# Patient Record
Sex: Male | Born: 1935 | Race: White | Hispanic: No | Marital: Married | Smoking: Former smoker
Health system: Southern US, Community
[De-identification: ages and names within clinical notes are randomized; demographics above are authoritative.]

## PROBLEM LIST (undated history)

## (undated) DIAGNOSIS — F1010 Alcohol abuse, uncomplicated: Secondary | ICD-10-CM

## (undated) DIAGNOSIS — R609 Edema, unspecified: Secondary | ICD-10-CM

## (undated) DIAGNOSIS — R600 Localized edema: Secondary | ICD-10-CM

## (undated) DIAGNOSIS — A419 Sepsis, unspecified organism: Secondary | ICD-10-CM

## (undated) DIAGNOSIS — C189 Malignant neoplasm of colon, unspecified: Secondary | ICD-10-CM

## (undated) DIAGNOSIS — N186 End stage renal disease: Secondary | ICD-10-CM

## (undated) DIAGNOSIS — Z87891 Personal history of nicotine dependence: Secondary | ICD-10-CM

## (undated) DIAGNOSIS — I10 Essential (primary) hypertension: Secondary | ICD-10-CM

## (undated) DIAGNOSIS — J439 Emphysema, unspecified: Secondary | ICD-10-CM

## (undated) DIAGNOSIS — J449 Chronic obstructive pulmonary disease, unspecified: Secondary | ICD-10-CM

## (undated) DIAGNOSIS — A0472 Enterocolitis due to Clostridium difficile, not specified as recurrent: Secondary | ICD-10-CM

## (undated) HISTORY — DX: Edema, unspecified: R60.9

## (undated) HISTORY — DX: Malignant neoplasm of colon, unspecified: C18.9

## (undated) HISTORY — PX: TONSILLECTOMY: SUR1361

## (undated) HISTORY — PX: COLONOSCOPY: SHX174

## (undated) HISTORY — PX: CATARACT EXTRACTION: SUR2

## (undated) HISTORY — DX: Alcohol abuse, uncomplicated: F10.10

## (undated) HISTORY — PX: COLON RESECTION: SHX5231

## (undated) HISTORY — DX: Emphysema, unspecified: J43.9

## (undated) HISTORY — DX: Localized edema: R60.0

---

## 1993-12-20 HISTORY — PX: MINOR HEMORRHOIDECTOMY: SHX6238

## 1998-05-13 ENCOUNTER — Other Ambulatory Visit: Admission: RE | Admit: 1998-05-13 | Discharge: 1998-05-13 | Payer: Self-pay | Admitting: Urology

## 2001-09-01 ENCOUNTER — Other Ambulatory Visit: Admission: RE | Admit: 2001-09-01 | Discharge: 2001-09-01 | Payer: Self-pay | Admitting: Urology

## 2001-09-01 ENCOUNTER — Encounter (INDEPENDENT_AMBULATORY_CARE_PROVIDER_SITE_OTHER): Payer: Self-pay | Admitting: Specialist

## 2003-01-18 ENCOUNTER — Emergency Department (HOSPITAL_COMMUNITY): Admission: EM | Admit: 2003-01-18 | Discharge: 2003-01-18 | Payer: Self-pay | Admitting: *Deleted

## 2003-01-19 ENCOUNTER — Emergency Department (HOSPITAL_COMMUNITY): Admission: EM | Admit: 2003-01-19 | Discharge: 2003-01-19 | Payer: Self-pay | Admitting: *Deleted

## 2003-01-21 ENCOUNTER — Emergency Department (HOSPITAL_COMMUNITY): Admission: EM | Admit: 2003-01-21 | Discharge: 2003-01-21 | Payer: Self-pay | Admitting: Emergency Medicine

## 2005-09-21 ENCOUNTER — Ambulatory Visit (HOSPITAL_BASED_OUTPATIENT_CLINIC_OR_DEPARTMENT_OTHER): Admission: RE | Admit: 2005-09-21 | Discharge: 2005-09-21 | Payer: Self-pay | Admitting: Otolaryngology

## 2005-09-21 ENCOUNTER — Encounter (INDEPENDENT_AMBULATORY_CARE_PROVIDER_SITE_OTHER): Payer: Self-pay | Admitting: *Deleted

## 2006-08-29 ENCOUNTER — Encounter: Admission: RE | Admit: 2006-08-29 | Discharge: 2006-08-29 | Payer: Self-pay | Admitting: Gastroenterology

## 2006-09-21 ENCOUNTER — Encounter (INDEPENDENT_AMBULATORY_CARE_PROVIDER_SITE_OTHER): Payer: Self-pay | Admitting: Specialist

## 2006-09-21 ENCOUNTER — Inpatient Hospital Stay (HOSPITAL_COMMUNITY): Admission: RE | Admit: 2006-09-21 | Discharge: 2006-09-27 | Payer: Self-pay | Admitting: General Surgery

## 2006-10-07 ENCOUNTER — Ambulatory Visit: Payer: Self-pay | Admitting: Hematology & Oncology

## 2006-12-06 ENCOUNTER — Ambulatory Visit (HOSPITAL_COMMUNITY): Admission: RE | Admit: 2006-12-06 | Discharge: 2006-12-06 | Payer: Self-pay | Admitting: Hematology & Oncology

## 2006-12-26 ENCOUNTER — Ambulatory Visit: Payer: Self-pay | Admitting: Hematology & Oncology

## 2006-12-29 LAB — COMPREHENSIVE METABOLIC PANEL
ALT: 46 U/L (ref 0–53)
AST: 46 U/L — ABNORMAL HIGH (ref 0–37)
BUN: 13 mg/dL (ref 6–23)
CO2: 29 mEq/L (ref 19–32)
Creatinine, Ser: 0.72 mg/dL (ref 0.40–1.50)
Total Bilirubin: 0.8 mg/dL (ref 0.3–1.2)

## 2006-12-29 LAB — CBC WITH DIFFERENTIAL/PLATELET
BASO%: 0.3 % (ref 0.0–2.0)
EOS%: 1.4 % (ref 0.0–7.0)
HCT: 44.9 % (ref 38.7–49.9)
LYMPH%: 25.2 % (ref 14.0–48.0)
MCH: 34 pg — ABNORMAL HIGH (ref 28.0–33.4)
MCHC: 33.8 g/dL (ref 32.0–35.9)
NEUT%: 62.2 % (ref 40.0–75.0)
Platelets: 198 10*3/uL (ref 145–400)

## 2006-12-29 LAB — CEA: CEA: 2.7 ng/mL (ref 0.0–5.0)

## 2007-03-20 ENCOUNTER — Ambulatory Visit (HOSPITAL_COMMUNITY): Admission: RE | Admit: 2007-03-20 | Discharge: 2007-03-20 | Payer: Self-pay | Admitting: Hematology & Oncology

## 2007-03-20 ENCOUNTER — Ambulatory Visit: Payer: Self-pay | Admitting: Hematology & Oncology

## 2007-03-23 LAB — CBC WITH DIFFERENTIAL/PLATELET
Basophils Absolute: 0 10*3/uL (ref 0.0–0.1)
EOS%: 1.3 % (ref 0.0–7.0)
Eosinophils Absolute: 0.1 10*3/uL (ref 0.0–0.5)
HCT: 45.7 % (ref 38.7–49.9)
HGB: 16.1 g/dL (ref 13.0–17.1)
MCH: 35.5 pg — ABNORMAL HIGH (ref 28.0–33.4)
NEUT#: 4.1 10*3/uL (ref 1.5–6.5)
NEUT%: 57.3 % (ref 40.0–75.0)
lymph#: 2.2 10*3/uL (ref 0.9–3.3)

## 2007-03-23 LAB — COMPREHENSIVE METABOLIC PANEL
Albumin: 5 g/dL (ref 3.5–5.2)
BUN: 11 mg/dL (ref 6–23)
CO2: 28 mEq/L (ref 19–32)
Calcium: 9.8 mg/dL (ref 8.4–10.5)
Chloride: 103 mEq/L (ref 96–112)
Creatinine, Ser: 0.72 mg/dL (ref 0.40–1.50)
Glucose, Bld: 93 mg/dL (ref 70–99)
Potassium: 4.4 mEq/L (ref 3.5–5.3)

## 2007-06-12 ENCOUNTER — Ambulatory Visit (HOSPITAL_COMMUNITY): Admission: RE | Admit: 2007-06-12 | Discharge: 2007-06-12 | Payer: Self-pay | Admitting: Hematology & Oncology

## 2007-06-14 ENCOUNTER — Ambulatory Visit: Payer: Self-pay | Admitting: Hematology & Oncology

## 2007-06-29 LAB — COMPREHENSIVE METABOLIC PANEL
ALT: 100 U/L — ABNORMAL HIGH (ref 0–53)
AST: 150 U/L — ABNORMAL HIGH (ref 0–37)
Albumin: 4.6 g/dL (ref 3.5–5.2)
Alkaline Phosphatase: 63 U/L (ref 39–117)
Potassium: 4.6 mEq/L (ref 3.5–5.3)
Sodium: 145 mEq/L (ref 135–145)
Total Bilirubin: 1 mg/dL (ref 0.3–1.2)
Total Protein: 7.5 g/dL (ref 6.0–8.3)

## 2007-06-29 LAB — CBC WITH DIFFERENTIAL/PLATELET
BASO%: 0.6 % (ref 0.0–2.0)
EOS%: 1.9 % (ref 0.0–7.0)
Eosinophils Absolute: 0.1 10*3/uL (ref 0.0–0.5)
LYMPH%: 26.2 % (ref 14.0–48.0)
MCH: 36 pg — ABNORMAL HIGH (ref 28.0–33.4)
MCHC: 35 g/dL (ref 32.0–35.9)
MCV: 102.9 fL — ABNORMAL HIGH (ref 81.6–98.0)
MONO%: 13.7 % — ABNORMAL HIGH (ref 0.0–13.0)
NEUT#: 3.3 10*3/uL (ref 1.5–6.5)
Platelets: 206 10*3/uL (ref 145–400)
RBC: 4.07 10*6/uL — ABNORMAL LOW (ref 4.20–5.71)
RDW: 15.6 % — ABNORMAL HIGH (ref 11.2–14.6)

## 2007-09-29 ENCOUNTER — Ambulatory Visit: Payer: Self-pay | Admitting: Hematology & Oncology

## 2007-10-03 LAB — CBC WITH DIFFERENTIAL/PLATELET
BASO%: 0.3 % (ref 0.0–2.0)
HCT: 41.6 % (ref 38.7–49.9)
LYMPH%: 28.7 % (ref 14.0–48.0)
MCHC: 35.3 g/dL (ref 32.0–35.9)
MCV: 101.4 fL — ABNORMAL HIGH (ref 81.6–98.0)
MONO#: 0.7 10*3/uL (ref 0.1–0.9)
MONO%: 10.7 % (ref 0.0–13.0)
NEUT%: 58.5 % (ref 40.0–75.0)
Platelets: 260 10*3/uL (ref 145–400)
RBC: 4.11 10*6/uL — ABNORMAL LOW (ref 4.20–5.71)
WBC: 6.9 10*3/uL (ref 4.0–10.0)

## 2007-10-03 LAB — COMPREHENSIVE METABOLIC PANEL
ALT: 17 U/L (ref 0–53)
AST: 22 U/L (ref 0–37)
Albumin: 4.4 g/dL (ref 3.5–5.2)
Alkaline Phosphatase: 73 U/L (ref 39–117)
Glucose, Bld: 123 mg/dL — ABNORMAL HIGH (ref 70–99)
Potassium: 4.4 mEq/L (ref 3.5–5.3)
Sodium: 142 mEq/L (ref 135–145)
Total Protein: 7.4 g/dL (ref 6.0–8.3)

## 2007-10-04 ENCOUNTER — Ambulatory Visit (HOSPITAL_COMMUNITY): Admission: RE | Admit: 2007-10-04 | Discharge: 2007-10-04 | Payer: Self-pay | Admitting: Hematology & Oncology

## 2008-02-12 ENCOUNTER — Ambulatory Visit: Payer: Self-pay | Admitting: Hematology & Oncology

## 2008-02-21 ENCOUNTER — Ambulatory Visit (HOSPITAL_COMMUNITY): Admission: RE | Admit: 2008-02-21 | Discharge: 2008-02-21 | Payer: Self-pay | Admitting: Hematology & Oncology

## 2008-02-21 LAB — CBC WITH DIFFERENTIAL/PLATELET
Basophils Absolute: 0 10*3/uL (ref 0.0–0.1)
Eosinophils Absolute: 0.1 10*3/uL (ref 0.0–0.5)
HGB: 15.1 g/dL (ref 13.0–17.1)
LYMPH%: 28.6 % (ref 14.0–48.0)
MCV: 101 fL — ABNORMAL HIGH (ref 81.6–98.0)
MONO%: 9.5 % (ref 0.0–13.0)
NEUT#: 4.2 10*3/uL (ref 1.5–6.5)
Platelets: 260 10*3/uL (ref 145–400)
RBC: 4.34 10*6/uL (ref 4.20–5.71)

## 2008-02-21 LAB — COMPREHENSIVE METABOLIC PANEL
AST: 30 U/L (ref 0–37)
Alkaline Phosphatase: 64 U/L (ref 39–117)
Glucose, Bld: 126 mg/dL — ABNORMAL HIGH (ref 70–99)
Sodium: 141 mEq/L (ref 135–145)
Total Bilirubin: 1.2 mg/dL (ref 0.3–1.2)
Total Protein: 7.8 g/dL (ref 6.0–8.3)

## 2008-07-01 ENCOUNTER — Ambulatory Visit: Payer: Self-pay | Admitting: Hematology & Oncology

## 2008-07-04 LAB — COMPREHENSIVE METABOLIC PANEL
Albumin: 4.6 g/dL (ref 3.5–5.2)
Alkaline Phosphatase: 63 U/L (ref 39–117)
CO2: 28 mEq/L (ref 19–32)
Calcium: 9.4 mg/dL (ref 8.4–10.5)
Chloride: 101 mEq/L (ref 96–112)
Glucose, Bld: 79 mg/dL (ref 70–99)
Potassium: 4.4 mEq/L (ref 3.5–5.3)
Sodium: 143 mEq/L (ref 135–145)
Total Protein: 7.6 g/dL (ref 6.0–8.3)

## 2008-07-04 LAB — CBC WITH DIFFERENTIAL/PLATELET
Eosinophils Absolute: 0.1 10*3/uL (ref 0.0–0.5)
HGB: 14.6 g/dL (ref 13.0–17.1)
MONO#: 0.7 10*3/uL (ref 0.1–0.9)
MONO%: 11.1 % (ref 0.0–13.0)
NEUT#: 3.7 10*3/uL (ref 1.5–6.5)
RBC: 4.18 10*6/uL — ABNORMAL LOW (ref 4.20–5.71)
RDW: 15.3 % — ABNORMAL HIGH (ref 11.2–14.6)
WBC: 6.2 10*3/uL (ref 4.0–10.0)
lymph#: 1.7 10*3/uL (ref 0.9–3.3)

## 2008-07-08 ENCOUNTER — Ambulatory Visit (HOSPITAL_COMMUNITY): Admission: RE | Admit: 2008-07-08 | Discharge: 2008-07-08 | Payer: Self-pay | Admitting: Hematology & Oncology

## 2008-07-09 ENCOUNTER — Ambulatory Visit: Payer: Self-pay | Admitting: Hematology & Oncology

## 2008-11-26 ENCOUNTER — Ambulatory Visit: Payer: Self-pay | Admitting: Hematology & Oncology

## 2008-11-27 LAB — CBC WITH DIFFERENTIAL/PLATELET
BASO%: 0.4 % (ref 0.0–2.0)
EOS%: 3.5 % (ref 0.0–7.0)
MCH: 35.7 pg — ABNORMAL HIGH (ref 28.0–33.4)
MCV: 105.3 fL — ABNORMAL HIGH (ref 81.6–98.0)
MONO%: 13.1 % — ABNORMAL HIGH (ref 0.0–13.0)
NEUT#: 2.2 10*3/uL (ref 1.5–6.5)
RBC: 4.1 10*6/uL — ABNORMAL LOW (ref 4.20–5.71)
RDW: 15.3 % — ABNORMAL HIGH (ref 11.2–14.6)
lymph#: 1.7 10*3/uL (ref 0.9–3.3)

## 2008-11-27 LAB — COMPREHENSIVE METABOLIC PANEL
AST: 68 U/L — ABNORMAL HIGH (ref 0–37)
Albumin: 4.3 g/dL (ref 3.5–5.2)
Alkaline Phosphatase: 61 U/L (ref 39–117)
BUN: 10 mg/dL (ref 6–23)
Calcium: 9.1 mg/dL (ref 8.4–10.5)
Chloride: 104 mEq/L (ref 96–112)
Glucose, Bld: 101 mg/dL — ABNORMAL HIGH (ref 70–99)
Potassium: 4.5 mEq/L (ref 3.5–5.3)
Sodium: 146 mEq/L — ABNORMAL HIGH (ref 135–145)
Total Protein: 7.3 g/dL (ref 6.0–8.3)

## 2008-11-28 ENCOUNTER — Ambulatory Visit (HOSPITAL_COMMUNITY): Admission: RE | Admit: 2008-11-28 | Discharge: 2008-11-28 | Payer: Self-pay | Admitting: Hematology & Oncology

## 2009-05-26 ENCOUNTER — Ambulatory Visit: Payer: Self-pay | Admitting: Hematology & Oncology

## 2009-05-28 LAB — CBC WITH DIFFERENTIAL/PLATELET
BASO%: 0.4 % (ref 0.0–2.0)
Basophils Absolute: 0 10*3/uL (ref 0.0–0.1)
EOS%: 3.7 % (ref 0.0–7.0)
HCT: 43.6 % (ref 38.4–49.9)
HGB: 15.1 g/dL (ref 13.0–17.1)
LYMPH%: 26.8 % (ref 14.0–49.0)
MCH: 37.3 pg — ABNORMAL HIGH (ref 27.2–33.4)
MCHC: 34.8 g/dL (ref 32.0–36.0)
MCV: 107.2 fL — ABNORMAL HIGH (ref 79.3–98.0)
MONO%: 11 % (ref 0.0–14.0)
NEUT%: 58.1 % (ref 39.0–75.0)
Platelets: 214 10*3/uL (ref 140–400)

## 2009-05-28 LAB — COMPREHENSIVE METABOLIC PANEL
AST: 81 U/L — ABNORMAL HIGH (ref 0–37)
Albumin: 4 g/dL (ref 3.5–5.2)
Alkaline Phosphatase: 68 U/L (ref 39–117)
BUN: 7 mg/dL (ref 6–23)
Creatinine, Ser: 0.74 mg/dL (ref 0.40–1.50)
Glucose, Bld: 91 mg/dL (ref 70–99)
Potassium: 4.5 mEq/L (ref 3.5–5.3)

## 2009-05-29 ENCOUNTER — Ambulatory Visit: Payer: Self-pay | Admitting: Diagnostic Radiology

## 2009-05-29 ENCOUNTER — Ambulatory Visit (HOSPITAL_BASED_OUTPATIENT_CLINIC_OR_DEPARTMENT_OTHER): Admission: RE | Admit: 2009-05-29 | Discharge: 2009-05-29 | Payer: Self-pay | Admitting: Hematology & Oncology

## 2009-05-30 ENCOUNTER — Ambulatory Visit: Payer: Self-pay | Admitting: Hematology & Oncology

## 2009-11-20 ENCOUNTER — Ambulatory Visit: Payer: Self-pay | Admitting: Hematology & Oncology

## 2009-11-24 LAB — COMPREHENSIVE METABOLIC PANEL
ALT: 40 U/L (ref 0–53)
AST: 52 U/L — ABNORMAL HIGH (ref 0–37)
Alkaline Phosphatase: 69 U/L (ref 39–117)
CO2: 32 mEq/L (ref 19–32)
Creatinine, Ser: 0.71 mg/dL (ref 0.40–1.50)
Sodium: 144 mEq/L (ref 135–145)
Total Bilirubin: 0.5 mg/dL (ref 0.3–1.2)
Total Protein: 7.7 g/dL (ref 6.0–8.3)

## 2009-11-24 LAB — CBC WITH DIFFERENTIAL/PLATELET
BASO%: 0.5 % (ref 0.0–2.0)
EOS%: 3.6 % (ref 0.0–7.0)
LYMPH%: 40.5 % (ref 14.0–49.0)
MCH: 36.9 pg — ABNORMAL HIGH (ref 27.2–33.4)
MCHC: 34 g/dL (ref 32.0–36.0)
MONO#: 0.6 10*3/uL (ref 0.1–0.9)
Platelets: 221 10*3/uL (ref 140–400)
RBC: 4.03 10*6/uL — ABNORMAL LOW (ref 4.20–5.82)
WBC: 5.4 10*3/uL (ref 4.0–10.3)

## 2009-11-24 LAB — CEA: CEA: 2.8 ng/mL (ref 0.0–5.0)

## 2009-11-25 ENCOUNTER — Ambulatory Visit (HOSPITAL_COMMUNITY): Admission: RE | Admit: 2009-11-25 | Discharge: 2009-11-25 | Payer: Self-pay | Admitting: Hematology & Oncology

## 2009-11-28 ENCOUNTER — Ambulatory Visit: Payer: Self-pay | Admitting: Hematology & Oncology

## 2010-01-25 ENCOUNTER — Emergency Department (HOSPITAL_COMMUNITY): Admission: EM | Admit: 2010-01-25 | Discharge: 2010-01-25 | Payer: Self-pay | Admitting: Emergency Medicine

## 2010-05-06 ENCOUNTER — Ambulatory Visit: Payer: Self-pay | Admitting: Hematology & Oncology

## 2010-06-18 ENCOUNTER — Ambulatory Visit: Payer: Self-pay | Admitting: Hematology & Oncology

## 2011-01-10 ENCOUNTER — Encounter: Payer: Self-pay | Admitting: Hematology & Oncology

## 2011-01-11 ENCOUNTER — Encounter: Payer: Self-pay | Admitting: Hematology & Oncology

## 2011-03-10 LAB — COMPREHENSIVE METABOLIC PANEL
ALT: 42 U/L (ref 0–53)
AST: 67 U/L — ABNORMAL HIGH (ref 0–37)
CO2: 31 mEq/L (ref 19–32)
Chloride: 96 mEq/L (ref 96–112)
GFR calc Af Amer: >60 mL/min (ref >60)
GFR calc non Af Amer: >60 mL/min (ref >60)
Potassium: 4 mEq/L (ref 3.5–5.1)
Sodium: 142 mEq/L (ref 135–145)
Total Bilirubin: 0.9 mg/dL (ref 0.3–1.2)

## 2011-03-10 LAB — CBC
RBC: 4.17 MIL/uL — ABNORMAL LOW (ref 4.22–5.81)
WBC: 7.7 10*3/uL (ref 4.0–10.5)

## 2011-03-10 LAB — BRAIN NATRIURETIC PEPTIDE: Pro B Natriuretic peptide (BNP): 39.9 pg/mL (ref 0.0–100.0)

## 2011-03-10 LAB — DIFFERENTIAL
Basophils Absolute: 0 10*3/uL (ref 0.0–0.1)
Eosinophils Absolute: 0.1 10*3/uL (ref 0.0–0.7)
Eosinophils Relative: 1 % (ref 0–5)
Monocytes Absolute: 0.9 10*3/uL (ref 0.1–1.0)

## 2011-05-07 NOTE — Op Note (Signed)
NAME:  JELAN, BOWSHER NO.:  [id number]   MEDICAL RECORD NO.:  [id number]          PATIENT TYPE:  AMB   LOCATION:  [place]                          FACILITY:  [facility]   PHYSICIAN:  Leonides Sake. Lucia Gaskins, M.D.DATE OF BIRTH:  Jul 10, 1933   DATE OF PROCEDURE:  09/21/2005  DATE OF DISCHARGE:                                 OPERATIVE REPORT   PREOPERATIVE DIAGNOSIS:  Left upper lip 6 mm nodular lesion.   POSTOPERATIVE DIAGNOSIS:  Left upper lip 6 mm nodular lesion.   OPERATION PERFORMED:  Excision of left upper lip lesion (6 mm).   SURGEON:  Leonides Sake. Lucia Gaskins, M.D.   ANESTHESIA:  Local 1% Xylocaine with 1:100,000 epinephrine.   COMPLICATIONS:  None.   INDICATIONS FOR PROCEDURE:  David Barton is a 76 year old gentleman who has  just recently shaved his mustache and since shaving his mustache, he has  noticed a nodule on his upper lip that has been there for several months.  He is not sure how long it has been there prior to shaving his mustache.  On  examination, David Barton has a flat nodular lesion measuring approximately 8 mm  in size on the left upper lip.  He is taken to the operating room at this  time for excision of left upper lip 6 mm nodule.   DESCRIPTION OF PROCEDURE:  The patient was brought to the minor room at  outpatient surgery.  The left upper lip was injected with 1 mL of Xylocaine  with epinephrine.  The lesion measuring approximately 6 mm was excised with  very narrow margins.  Cautery was used for hemostasis and then the small  defect was closed with three interrupted 6-0 chromic sutures.  The lesion  was sent to pathology.  Edmond tolerated this well.   DISPOSITION:  David Barton is discharged home later this morning and will call my  office in three days for results of the pathology and is instructed to take  Tylenol as needed for pain.           ______________________________  Leonides Sake. Lucia Gaskins, M.D.     CEN/MEDQ  D:  09/21/2005  T:   09/21/2005  Job:  [id number]   cc:   Sherren Kerns. Pamella Pert, MD  Fax: [phone]

## 2011-05-07 NOTE — Op Note (Signed)
NAME:  David Barton, David Barton NO.:  [id number]   MEDICAL RECORD NO.:  [id number]          PATIENT TYPE:  INP   LOCATION:  0004                         FACILITY:  [facility]   PHYSICIAN:  Shellia Carwin, M.D. DATE OF BIRTH:  Mar 01, 1933   DATE OF PROCEDURE:  09/21/2006  DATE OF DISCHARGE:                                 OPERATIVE REPORT   OPERATIVE PROCEDURE:  Resection sigmoid colon with cancer, incidental  appendectomy.   SURGEON:  Shellia Carwin, M.D.   ASSISTANT:  Orson Ape. Rise Patience, M.D.   ANESTHESIA:  General endotracheal.   PREOPERATIVE DIAGNOSIS:  Carcinoma of the sigmoid.   POSTOPERATIVE DIAGNOSIS:  Carcinoma of the sigmoid.   CLINICAL SUMMARY:  This 77 year old man had a colonoscopy showing cancer by  biopsy, and he is brought in for elective resection.  CT scan showed no  mesenteric or metastatic disease.  He had a normal CEA.   OPERATIVE FINDINGS:  The patient had a very redundant sigmoid colon, and  this was probably the cause of why he had difficulty being colonoscoped in  the past.  The appendix was actually involved in this extra colon with  adhesions and was removed incidentally.  The liver felt and looked normal.  There were no enlarged nodes, and there was no fluid.  The left ureter was  identified and not injured.   OPERATIVE PROCEDURE:  Under satisfactory general endotracheal anesthesia,  having received preop antibiotics, Lovenox, and a good bowel prep, the  patient was positioned, prepped, and draped in a standard fashion.  A  nasogastric tube and Foley catheter had been placed.  A midline incision was  made from the pubis up to the umbilicus and around it.  Then, midline opened  into the peritoneum and laparotomy performed.  We placed self-retaining  retractors and began the operation by freeing the appendix and removing it.  The mesoappendix was clamped, cut, and tied with silk.  The base the  appendix was tied with Vicryl and the  appendix amputated and inverted into a  3-0 silk pursestring.  Hemostasis was good.  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  There was no injury to the small bowel, which was  dissected away from where the mesentery of the small bowel was attached to  the colon.  When completed, we were able to free the distal rectosigmoid up  to the descending colon.  The large tumor was palpated in the distal  sigmoid.  The bowel was transected distally to this between clamps, and then  the mesentery taken with clamp, clamp, cut technique, and tying with 2-0  silk.  Proximally we went further than needed for cancer, but to remove the  redundant colon and also make future colonoscopy easier.  The portion of the  proximal sigmoid was chosen and divided between clamps and the specimen  removed.  The ureter was identified.  The anastomosis was completed in an  end-to-end fashion with interrupted silk suture of mattress, simple and the  Gambee types.  When completed, the gloves were changed.  The abdomen was  lavaged with saline.  Mesentery closed with interrupted silk  suture, and then the abdomen closed with a running double-stranded #PDS  midline suture.  The skin edges were stapled and a sterile absorbent  dressing applied.  There were no complications, and the sponge and needle  count was correct.           ______________________________  Shellia Carwin, M.D.     MRL/MEDQ  D:  09/21/2006  T:  09/22/2006  Job:  [id number]   cc:   Cletis Athens, M.D.  Fax: [place]. Bubba Hales, M.D.  Fax: [phone]

## 2011-05-07 NOTE — Discharge Summary (Signed)
NAME:  David Barton, David Barton NO.:  [id number]   MEDICAL RECORD NO.:  [id number]          PATIENT TYPE:  INP   LOCATION:  [id number]                         [facility]ACILITY:  [facility]   PHYSICIAN:  Shellia Carwin, M.D. DATE OF BIRTH:  Jun 28, 1933   DATE OF ADMISSION:  09/21/2006  DATE OF DISCHARGE:  09/27/2006                                 DISCHARGE SUMMARY   CHIEF COMPLAINT:  Cancer sigmoid colon.   HISTORY OF PRESENT ILLNESS:  [name] is a 77 year old male brought in for  elective colectomy, having had biopsy proven cancer at about 22 cm on  colonoscopy by Dr. Sammuel Cooper.  I treated him for hemorrhoids many years ago.  His general health was quite good.  He had preoperative workup and came in  for elective surgery.   LABORATORY DATA:  Protime and INR were both normal.  Hemoglobin 16,  hematocrit 47, with a white count 6200.  CMET normal except for slightly  elevated glucose of 116, calcium 1/10 of a point elevated at 10.4, elevated  triglyceride of 223.  The CEA was normal at 2.2.  Pathology report showed  carcinoma of the sigmoid colon.  The tumor was 3.8 cm in size, margins free,  25 lymph nodes negative.  His follow-up hemoglobin was 13, hematocrit 37,  white count 10,000.   X-rays showed no acute cardiopulmonary findings.  An outpatient preop CAT  scan showed no metastatic disease.  EKG showed some sinus tachycardia.   HOSPITAL COURSE:  On 09/21/2006 the patient underwent resection of a sigmoid  colon with cancer and incidental appendectomy.  Postoperatively he did very  well.  He was comfortable, active.  His nasogastric, Foley catheters and  oxygen removed on schedule.  He regained bowel function.  He had no  complications, wound looked good and he was able to be discharged on  09/27/2006, the sixth postop day.  He was allowed home on a regular diet,  limited activity and wound care.  He will be seen in the office next week  for suture removal.  His family knows Dr. Burney Gauze and we will arrange  outpatient oncology consultation with him.   DISCHARGE DIAGNOSIS:  Carcinoma sigmoid colon.   OPERATION:  Sigmoid resection with incidental appendectomy.   COMPLICATIONS INFECTIONS CONSULTATIONS:  None.   CONDITION AT DISCHARGE:  Good.           ______________________________  Shellia Carwin, M.D.     MRL/MEDQ  D:  09/27/2006  T:  09/28/2006  Job:  [id number]   cc:   Russ Halo. Sheryn Bison, M.D.  Fax: [phone]   Rudell Cobb. Marin Olp, M.D.  Fax: [phone]   Cletis Athens, M.D.  Fax: [place]. Bubba Hales, M.D.  Fax: [phone]

## 2013-05-08 ENCOUNTER — Emergency Department (HOSPITAL_COMMUNITY): Payer: Medicare Other

## 2013-05-08 ENCOUNTER — Encounter (HOSPITAL_COMMUNITY): Payer: Self-pay | Admitting: Radiology

## 2013-05-08 ENCOUNTER — Observation Stay (HOSPITAL_COMMUNITY)
Admission: AD | Admit: 2013-05-08 | Discharge: 2013-05-10 | Disposition: A | Discharge status: Home / Self Care | Payer: Medicare Other | Attending: Internal Medicine | Admitting: Internal Medicine

## 2013-05-08 DIAGNOSIS — J449 Chronic obstructive pulmonary disease, unspecified: Secondary | ICD-10-CM | POA: Insufficient documentation

## 2013-05-08 DIAGNOSIS — E876 Hypokalemia: Secondary | ICD-10-CM

## 2013-05-08 DIAGNOSIS — Z7982 Long term (current) use of aspirin: Secondary | ICD-10-CM | POA: Insufficient documentation

## 2013-05-08 DIAGNOSIS — J9600 Acute respiratory failure, unspecified whether with hypoxia or hypercapnia: Secondary | ICD-10-CM

## 2013-05-08 DIAGNOSIS — R82998 Other abnormal findings in urine: Secondary | ICD-10-CM | POA: Insufficient documentation

## 2013-05-08 DIAGNOSIS — N390 Urinary tract infection, site not specified: Secondary | ICD-10-CM

## 2013-05-08 DIAGNOSIS — Z87891 Personal history of nicotine dependence: Secondary | ICD-10-CM | POA: Insufficient documentation

## 2013-05-08 DIAGNOSIS — Z79899 Other long term (current) drug therapy: Secondary | ICD-10-CM | POA: Insufficient documentation

## 2013-05-08 DIAGNOSIS — R0902 Hypoxemia: Secondary | ICD-10-CM

## 2013-05-08 DIAGNOSIS — J4489 Other specified chronic obstructive pulmonary disease: Secondary | ICD-10-CM | POA: Insufficient documentation

## 2013-05-08 DIAGNOSIS — I959 Hypotension, unspecified: Secondary | ICD-10-CM

## 2013-05-08 DIAGNOSIS — R42 Dizziness and giddiness: Principal | ICD-10-CM

## 2013-05-08 DIAGNOSIS — L538 Other specified erythematous conditions: Secondary | ICD-10-CM | POA: Insufficient documentation

## 2013-05-08 HISTORY — DX: Essential (primary) hypertension: I10

## 2013-05-08 LAB — URINALYSIS, ROUTINE W REFLEX MICROSCOPIC
Bilirubin Urine: NEGATIVE
Glucose, UA: NEGATIVE mg/dL
Hgb urine dipstick: NEGATIVE
Ketones, ur: NEGATIVE mg/dL
pH: 5.5 (ref 5.0–8.0)

## 2013-05-08 LAB — COMPREHENSIVE METABOLIC PANEL
Albumin: 2.7 g/dL — ABNORMAL LOW (ref 3.5–5.2)
BUN: 17 mg/dL (ref 6–23)
Calcium: 8.6 mg/dL (ref 8.4–10.5)
GFR calc Af Amer: >90 mL/min (ref >90)
Glucose, Bld: 121 mg/dL — ABNORMAL HIGH (ref 70–99)
Potassium: 3.3 mEq/L — ABNORMAL LOW (ref 3.5–5.1)
Sodium: 139 mEq/L (ref 135–145)
Total Protein: 6.3 g/dL (ref 6.0–8.3)

## 2013-05-08 LAB — CBC WITH DIFFERENTIAL/PLATELET
Basophils Absolute: 0 10*3/uL (ref 0.0–0.1)
Basophils Relative: 0 % (ref 0–1)
Eosinophils Absolute: 0.2 10*3/uL (ref 0.0–0.7)
Hemoglobin: 13.3 g/dL (ref 13.0–17.0)
Lymphocytes Relative: 20 % (ref 12–46)
MCH: 37.9 pg — ABNORMAL HIGH (ref 26.0–34.0)
MCHC: 33.6 g/dL (ref 30.0–36.0)
Monocytes Absolute: 0.9 10*3/uL (ref 0.1–1.0)
Neutro Abs: 5.1 10*3/uL (ref 1.7–7.7)
Neutrophils Relative %: 65 % (ref 43–77)
RDW: 14.1 % (ref 11.5–15.5)

## 2013-05-08 LAB — TROPONIN I: Troponin I: <0.3 ng/mL (ref <0.30)

## 2013-05-08 LAB — D-DIMER, QUANTITATIVE: D-Dimer, Quant: 1.04 ug/mL-FEU — ABNORMAL HIGH (ref 0.00–0.48)

## 2013-05-08 MED ORDER — SODIUM CHLORIDE 0.9 % IV SOLN
INTRAVENOUS | Status: AC
  Administered 2013-05-08: 22:00:00 via INTRAVENOUS

## 2013-05-08 MED ORDER — IOHEXOL 350 MG/ML SOLN
100.0000 mL | Freq: Once | INTRAVENOUS | Status: AC | PRN
  Administered 2013-05-08: 100 mL via INTRAVENOUS

## 2013-05-08 MED ORDER — DEXTROSE 5 % IV SOLN
1.0000 g | Freq: Once | INTRAVENOUS | Status: AC
  Administered 2013-05-08: 1 g via INTRAVENOUS
  Filled 2013-05-08: qty 10

## 2013-05-08 MED ORDER — SODIUM CHLORIDE 0.9 % IV BOLUS (SEPSIS)
500.0000 mL | Freq: Once | INTRAVENOUS | Status: AC
  Administered 2013-05-08: 500 mL via INTRAVENOUS

## 2013-05-08 NOTE — ED Notes (Signed)
Pt was able to ambulate to the bathroom with one assist.

## 2013-05-08 NOTE — H&P (Addendum)
[facility] History and Physical  David Barton [id number] DOB: 04-08-1933 DOA: 05/08/2013  Referring physician: er PCP: Gennette Pac, MD  Specialists:   Chief Complaint: low BP  HPI: David Barton is a 79 y.o. male  Who saw his PCP yesterday with an infection under his arm and a painful neck.  He was given 2 prescriptions- one for flexoril and another one he does not recall the name (I called his wife, no answer).  Today he was sitting watching TV and he felt lightheaded.  the room was not spinning nor was he.  He just felt "weird".  He called his sone who called EMS. His SBP was in the 80s when they checked.  He was brought to the ER for further evaluation.  BP back to normal after IVF.  His O2 sats were also found to be low in the 80s on RA- patient does not recall his PCP ever checking his O2 sat,+ tobacco hx  Urinary frequency but not pain with urination  Review of Systems: all systems reviewed negative unless stated above    History reviewed. No pertinent past medical history. No past surgical history on file. Social History:  + tobacco abuse for "years"; denies alcohol   No Known Allergies  Family Hx: +HTN  Prior to Admission medications   Medication Sig Start Date End Date Taking? Authorizing Provider  aspirin EC 81 MG tablet Take 81 mg by mouth every morning.   Yes Historical Provider, MD  augmented betamethasone dipropionate (DIPROLENE-AF) 0.05 % cream Apply 1 application topically daily.   Yes Historical Provider, MD  cephALEXin (KEFLEX) 500 MG capsule Take 1,000 mg by mouth 2 (two) times daily. 05/07/13 05/14/13 Yes Historical Provider, MD  cyclobenzaprine (FLEXERIL) 10 MG tablet Take 10 mg by mouth 3 (three) times daily. 05/07/13 05/16/13 Yes Historical Provider, MD  ibuprofen (ADVIL,MOTRIN) 800 MG tablet Take 800 mg by mouth 3 (three) times daily as needed for pain.   Yes Historical Provider, MD  ketoconazole (NIZORAL) 2 % cream Apply 1 application  topically daily. 05/07/13 05/17/13 Yes Historical Provider, MD  lisinopril (PRINIVIL,ZESTRIL) 10 MG tablet Take 10 mg by mouth every morning.   Yes Historical Provider, MD   Physical Exam: Filed Vitals:   05/08/13 1845 05/08/13 1914 05/08/13 1932 05/08/13 1937  BP: 115/66 119/65    Pulse: 66     Temp:  97.9 F (36.6 C)    TempSrc:  Oral    Resp: 14 19    Height:      Weight:      SpO2: 96% 96% 90% 93%     General:  Pleasant/cooperative, A+Ox3, NAD- 2L O2  Eyes: wnl  ENT: wnl  Neck: supple, no JVD, no nucal rigidity  Cardiovascular: rrr  Respiratory: decreased B/S b/L  Abdomen: +BS, soft, obese  Skin: Erythematous rash in R axilla with scattered pustules. Raised border  Musculoskeletal: moves all4 ext  Psychiatric: normal mood/affect  Neurologic: CN 2-12 intact  Labs on Admission:  Basic Metabolic Panel:  Recent Labs Lab 05/08/13 1714  NA 139  K 3.3*  CL 98  CO2 30  GLUCOSE 121*  BUN 17  CREATININE 0.83  CALCIUM 8.6   Liver Function Tests:  Recent Labs Lab 05/08/13 1714  AST 51*  ALT 18  ALKPHOS 80  BILITOT 0.4  PROT 6.3  ALBUMIN 2.7*   No results found for this basename: LIPASE, AMYLASE,  in the last 168 hours No results found for this basename: AMMONIA,  in the last 168 hours CBC:  Recent Labs Lab 05/08/13 1714  WBC 7.7  NEUTROABS 5.1  HGB 13.3  HCT 39.6  MCV 112.8*  PLT 243   Cardiac Enzymes:  Recent Labs Lab 05/08/13 1714  TROPONINI <0.30    BNP (last 3 results)  Recent Labs  05/08/13 2000  PROBNP 141.8   CBG: No results found for this basename: GLUCAP,  in the last 168 hours  Radiological Exams on Admission: Dg Chest 1 View  05/08/2013   *RADIOLOGY REPORT*  Clinical Data: Dizziness.  Generalized weakness.  Shortness of breath.  CHEST - 1 VIEW  Comparison: Two-view chest x-ray 01/25/2010.  Findings: Cardiac silhouette normal in size, unchanged.  Thoracic aorta mildly atherosclerotic, unchanged.  Hilar and  mediastinal contours otherwise unremarkable.  Emphysematous changes in the upper lobes with vascular redistribution to the mid and lower lungs.  Mildly prominent bronchovascular markings diffusely mild central peribronchial thickening, unchanged.  Lungs otherwise clear.  No visible pleural effusions.  IMPRESSION: COPD/emphysema.  No acute cardiopulmonary disease.   Original Report Authenticated By: Evangeline Dakin, M.D.   Ct Angio Chest Pe W/cm &/or Wo Cm  05/08/2013   *RADIOLOGY REPORT*  Clinical Data: Hypoxia.  Elevated D-dimer.  Shortness of breath. Dizziness.  CT ANGIOGRAPHY CHEST  Technique:  Multidetector CT imaging of the chest using the standard protocol during bolus administration of intravenous contrast. Multiplanar reconstructed images including MIPs were obtained and reviewed to evaluate the vascular anatomy.  Contrast: 115mL OMNIPAQUE IOHEXOL 350 MG/ML SOLN  Comparison: Multiple prior chest CTs.  Findings: The chest wall is unremarkable.  No supraclavicular or axillary mass or adenopathy.  The thyroid gland is normal.  The bony thorax is intact.  No destructive bone lesions or spinal canal compromise.  Minimal degenerative changes involving the thoracic spine for age.  Remote rib fractures are noted.  The heart is normal in size.  No pericardial effusion.  No mediastinal or hilar lymphadenopathy.  The aorta is normal in caliber.  Moderate atherosclerotic calcifications.  No dissection. Three-vessel coronary artery calcifications are noted.  The esophagus is grossly normal.  The pulmonary arterial tree is fairly well opacified.  No filling defects are seen to suggest pulmonary emboli.  Examination of the lung parenchyma demonstrates advanced emphysematous changes and there is a pulmonary scarring.  No infiltrates, edema or effusions.  No worrisome pulmonary mass lesions or pulmonary nodules.  Mild peribronchial thickening.  The upper abdomen is unremarkable.  There is diffuse fatty infiltration of the  liver and fatty change in the falciform ligament.  Upper abdominal atherosclerotic changes are noted but no focal aneurysm or dissection.  IMPRESSION:  1.  No CT findings for pulmonary embolism. 2.  Moderate atherosclerotic calcifications involving the aorta but no focal aneurysm or dissection. 3.  Three-vessel coronary artery calcifications. 4.  Advanced emphysematous changes and pulmonary scarring but no acute pulmonary findings or worrisome pulmonary lesions.   Original Report Authenticated By: Marijo Sanes, M.D.    EKG: Independently reviewed. NSR on strip  Assessment/Plan Active Problems:   Hypokalemia   Hypotension   Dizziness   Hypoxia   Acute respiratory failure   1. Dizziness- resolved, most like related to mediations (flexoril); cycel CE, place on tele 2. Hypokalemia- replete 3. ?UTI- kelfex for skin infection should cover 4. Hypoxia- ? From emphysema: never been diagnosised but seen on CT scan- wean off O2 as tolerated, ambulate patient on room air- may need O2 at home; CT scan negative for PE 5. Hypotension-  resolved with IVF     Code Status: full Family Communication: patient Disposition Plan: obs  Time spent: 70 min  Gwendlyon Zumbro [facility] Pager [phone]  If 7PM-7AM, please contact night-coverage [email or web address] Password [facility] 05/08/2013, 10:23 PM

## 2013-05-08 NOTE — ED Provider Notes (Signed)
History     CSN: [id number]  Arrival date & time 05/08/13  1650   First MD Initiated Contact with Patient 05/08/13 1705      Chief Complaint  Patient presents with  . Dizziness    (Consider location/radiation/quality/duration/timing/severity/associated sxs/prior treatment) HPI Pt states 2 hours prior to presentation he was sitting, became diaphoretic and lightheaded. His son called EMS. Noted to be hypotensive with SBP in 80's. Pt states he normally is hypertensive. Denies CP, SOB, cough, fever, chills, N/V/D at any point. Pt states he feels fine when lying down. Started on Keflex for presumed superinfection of yeast infection in R axilla yesterday by PCP.  No past medical history on file.  No past surgical history on file.  No family history on file.  History  Substance Use Topics  . Smoking status: Not on file  . Smokeless tobacco: Not on file  . Alcohol Use: Not on file      Review of Systems  Constitutional: Positive for diaphoresis. Negative for fever, chills and fatigue.  HENT: Positive for neck pain. Negative for neck stiffness.   Eyes: Negative for visual disturbance.  Respiratory: Negative for cough, shortness of breath and wheezing.   Cardiovascular: Negative for chest pain, palpitations and leg swelling.  Gastrointestinal: Negative for nausea, vomiting, abdominal pain and diarrhea.  Genitourinary: Negative for dysuria, frequency and flank pain.  Musculoskeletal: Negative for myalgias and back pain.  Skin: Positive for rash. Negative for pallor and wound.  Neurological: Positive for dizziness and light-headedness. Negative for syncope, weakness, numbness and headaches.  All other systems reviewed and are negative.    Allergies  Review of patient's allergies indicates no known allergies.  Home Medications  No current outpatient prescriptions on file.  BP 84/64  Pulse 73  Temp(Src) 98.1 F (36.7 C) (Oral)  Ht 5\' 10"  (1.778 m)  Wt 227 lb (102.967 kg)   BMI 32.57 kg/m2  SpO2 98%  Physical Exam  Nursing note and vitals reviewed. Constitutional: He is oriented to person, place, and time. He appears well-developed and well-nourished. No distress.  HENT:  Head: Normocephalic and atraumatic.  Mouth/Throat: Oropharynx is clear and moist. No oropharyngeal exudate.  Eyes: EOM are normal. Pupils are equal, round, and reactive to light.  Neck: Normal range of motion. Neck supple.  Cardiovascular: Normal rate and regular rhythm.   Pulmonary/Chest: Effort normal and breath sounds normal. No respiratory distress. He has no wheezes. He has no rales. He exhibits no tenderness.  Abdominal: Soft. Bowel sounds are normal. He exhibits no distension and no mass. There is no tenderness. There is no rebound and no guarding.  Musculoskeletal: Normal range of motion. He exhibits no edema and no tenderness.  Neurological: He is alert and oriented to person, place, and time.  5/5 motor in all ext, sensation intact, CN II-XII intact  Skin: Skin is warm and dry. Rash noted. No erythema.  Erythematous rash in R axilla with scattered pustules. Raised border  Psychiatric: He has a normal mood and affect. His behavior is normal.    ED Course  Procedures (including critical care time)  Labs Reviewed  TROPONIN I  URINALYSIS, ROUTINE W REFLEX MICROSCOPIC  CBC WITH DIFFERENTIAL  COMPREHENSIVE METABOLIC PANEL   No results found.   No diagnosis found.    Date: 05/08/2013  Rate:68  Rhythm: normal sinus rhythm  QRS Axis: normal  Intervals: normal  ST/T Wave abnormalities: normal  Conduction Disutrbances:none  Narrative Interpretation:   Old EKG Reviewed: none available Scattered  PAC's  MDM   Pt remain asymptomatic in ED. Requiring 2 L O2 to maintain O2 sats over 90%. Discussed with [facility]. Will see in ED and admit to observation.        Julianne Rice, MD 05/08/13 2207

## 2013-05-08 NOTE — ED Notes (Signed)
Kyndell Heward, pt's wife, tel # [phone].

## 2013-05-08 NOTE — ED Notes (Signed)
Per EMS:  Pt became dizzy with hot flashes two hours ago. Pt was clammy;  Hypotensive.  Denies N/V/D.  HX: Hypertension, treated for stiff neck neck yesterday with Cephalexin and Ibuprofin and a yeast infection on right arm.

## 2013-05-09 DIAGNOSIS — R0902 Hypoxemia: Secondary | ICD-10-CM

## 2013-05-09 DIAGNOSIS — I959 Hypotension, unspecified: Secondary | ICD-10-CM

## 2013-05-09 LAB — VITAMIN B12: Vitamin B-12: 286 pg/mL (ref 211–911)

## 2013-05-09 LAB — BASIC METABOLIC PANEL
CO2: 33 mEq/L — ABNORMAL HIGH (ref 19–32)
Chloride: 98 mEq/L (ref 96–112)
Creatinine, Ser: 0.76 mg/dL (ref 0.50–1.35)
GFR calc Af Amer: >90 mL/min (ref >90)
Potassium: 4.2 mEq/L (ref 3.5–5.1)

## 2013-05-09 LAB — CBC
HCT: 41.1 % (ref 39.0–52.0)
Hemoglobin: 13.6 g/dL (ref 13.0–17.0)
MCV: 113.5 fL — ABNORMAL HIGH (ref 78.0–100.0)
RBC: 3.62 MIL/uL — ABNORMAL LOW (ref 4.22–5.81)
WBC: 7.3 10*3/uL (ref 4.0–10.5)

## 2013-05-09 LAB — TROPONIN I: Troponin I: <0.3 ng/mL (ref <0.30)

## 2013-05-09 LAB — URINE CULTURE
Colony Count: NO GROWTH
Culture: NO GROWTH

## 2013-05-09 LAB — FOLATE: Folate: 4.7 ng/mL

## 2013-05-09 MED ORDER — ONDANSETRON HCL 4 MG PO TABS: 4.0000 mg | ORAL_TABLET | Freq: Four times a day (QID) | ORAL | Status: DC | PRN

## 2013-05-09 MED ORDER — ACETAMINOPHEN 325 MG PO TABS: 650.0000 mg | ORAL_TABLET | Freq: Four times a day (QID) | ORAL | Status: DC | PRN

## 2013-05-09 MED ORDER — ONDANSETRON HCL 4 MG/2ML IJ SOLN: 4.0000 mg | Freq: Four times a day (QID) | INTRAMUSCULAR | Status: DC | PRN

## 2013-05-09 MED ORDER — ENOXAPARIN SODIUM 40 MG/0.4ML ~~LOC~~ SOLN
40.0000 mg | SUBCUTANEOUS | Status: DC
  Administered 2013-05-09 – 2013-05-10 (×2): 40 mg via SUBCUTANEOUS
  Filled 2013-05-09 (×2): qty 0.4

## 2013-05-09 MED ORDER — HYDRALAZINE HCL 20 MG/ML IJ SOLN
10.0000 mg | Freq: Four times a day (QID) | INTRAMUSCULAR | Status: DC | PRN
  Administered 2013-05-09 (×2): 10 mg via INTRAVENOUS
  Filled 2013-05-09 (×2): qty 1

## 2013-05-09 MED ORDER — LISINOPRIL 10 MG PO TABS
10.0000 mg | ORAL_TABLET | Freq: Every morning | ORAL | Status: DC
  Administered 2013-05-09: 10 mg via ORAL
  Filled 2013-05-09: qty 1

## 2013-05-09 MED ORDER — FLUCONAZOLE 100 MG PO TABS
100.0000 mg | ORAL_TABLET | Freq: Every day | ORAL | Status: DC
  Administered 2013-05-09 – 2013-05-10 (×2): 100 mg via ORAL
  Filled 2013-05-09 (×3): qty 1

## 2013-05-09 MED ORDER — LISINOPRIL 20 MG PO TABS
20.0000 mg | ORAL_TABLET | Freq: Every morning | ORAL | Status: DC
  Administered 2013-05-10: 20 mg via ORAL
  Filled 2013-05-09: qty 1

## 2013-05-09 MED ORDER — CEPHALEXIN 500 MG PO CAPS
1000.0000 mg | ORAL_CAPSULE | Freq: Two times a day (BID) | ORAL | Status: DC
  Administered 2013-05-09 – 2013-05-10 (×3): 1000 mg via ORAL
  Filled 2013-05-09 (×4): qty 2

## 2013-05-09 MED ORDER — LISINOPRIL 10 MG PO TABS
10.0000 mg | ORAL_TABLET | Freq: Every day | ORAL | Status: AC
  Administered 2013-05-09: 10 mg via ORAL
  Filled 2013-05-09: qty 1

## 2013-05-09 MED ORDER — TIOTROPIUM BROMIDE MONOHYDRATE 18 MCG IN CAPS
18.0000 ug | ORAL_CAPSULE | Freq: Every day | RESPIRATORY_TRACT | Status: DC
  Administered 2013-05-09 – 2013-05-10 (×2): 18 ug via RESPIRATORY_TRACT
  Filled 2013-05-09: qty 5

## 2013-05-09 MED ORDER — ASPIRIN EC 81 MG PO TBEC
81.0000 mg | DELAYED_RELEASE_TABLET | Freq: Every morning | ORAL | Status: DC
  Administered 2013-05-09 – 2013-05-10 (×2): 81 mg via ORAL
  Filled 2013-05-09 (×2): qty 1

## 2013-05-09 MED ORDER — ALBUTEROL SULFATE HFA 108 (90 BASE) MCG/ACT IN AERS
2.0000 | INHALATION_SPRAY | Freq: Four times a day (QID) | RESPIRATORY_TRACT | Status: DC
  Administered 2013-05-09 – 2013-05-10 (×3): 2 via RESPIRATORY_TRACT
  Filled 2013-05-09: qty 6.7

## 2013-05-09 MED ORDER — ACETAMINOPHEN 650 MG RE SUPP: 650.0000 mg | Freq: Four times a day (QID) | RECTAL | Status: DC | PRN

## 2013-05-09 MED ORDER — SODIUM CHLORIDE 0.9 % IJ SOLN
3.0000 mL | Freq: Two times a day (BID) | INTRAMUSCULAR | Status: DC
  Administered 2013-05-09 – 2013-05-10 (×2): 3 mL via INTRAVENOUS

## 2013-05-09 MED ORDER — KETOCONAZOLE 2 % EX CREA
1.0000 "application " | TOPICAL_CREAM | Freq: Every day | CUTANEOUS | Status: DC
  Administered 2013-05-10: 1 via TOPICAL
  Filled 2013-05-09 (×2): qty 15

## 2013-05-09 NOTE — Progress Notes (Signed)
Pt ambulated without oxygen and oxygen level dropped to 88%. When patient sat down O2 levels immediately increased to 92%. Decreased oxygen to 1 L and patient has maintained oxygen level at 90-94%.

## 2013-05-09 NOTE — Progress Notes (Signed)
[facility] PROGRESS NOTE  HOLTEN MARZEC [id number] DOB: [birth year] DOA: 05/08/2013 PCP: Gennette Pac, MD  Assessment/Plan: Principal Problem:   Dizziness Active Problems:   Hypokalemia   Hypotension   Hypoxia   Acute respiratory failure    1. Dizziness/Hypotension: Patient presented with dizziness, without headache or vertigo. SBP was reportedly in the 80s, but responded to iv fluid bolus, administered in the ED. Patient has remained asymptomatic since.This presentation was likely due to volume depletion and medication side effect, with Flexeril, as the likely culprit. Flexeril was discontinued. Cardiac enzymes have remained unelevated, and no arrhythmias have been recorded on telemetric monitoring. As BP has started creeping up this AM, will resume pre-admission antihypertensives.  2. Hypokalemia: Mild, repleting as indicated. 3. Intertrigo right axilla: Patient has had progressive redness right axillary region, foe almost 2 weeks, and was commenced by PMD on Keflex, on 05/07/13. Possibly for secondary infection, as well as an antifungal cream. Will add diflucan for 7 days.  4. Query UTI: Urine sediment revealed mild pyuria. Significance is uncertain, but patient is already on a cephalosporin.  5. Hypoxia: This was an incidental finding on initial evaluation, with O2 sats in the 80s on RA. Imaging studies revealed COPD/Emphysema changes, but no acute findings, and CTA was negative for PE. Patient quit smoking over 2 decades ago. He has no wheezes on physical examination, and sats have improved to 195-96% on 1L-2L oxygen. Will add Spiriva to treatment, as well as albuterol inhaler, and attempt to wean O2.    Code Status: Full Code.  Family Communication:  Disposition Plan: To be determined.    Brief narrative: 79 y.o. Male, with no significant past medical history, who saw his PCP on 05/07/13 with an infection under his arm and a painful neck. He was given 2  prescriptions, one for Flexeril and another one he does not recall the name of. On 05/08/13, while sitting and watching TV, he felt lightheaded, without vertigo, and he "just felt "weird". He called his son, who called EMS. His SBP was in the 80s when they checked. He was brought to the ER for further evaluation. BP back to normal after IVF.  His O2 sats were also found to be low in the 80s on RA- patient does not recall his PCP ever checking his O2 sats. Admitted for further management.    Consultants:  N/A.   Procedures:  CXR.  CTA  Antibiotics:  Cefalexin 05/09/13>>>  HPI/Subjective: No new issues. No dizziness.   Objective: Vital signs in last 24 hours: Temp:  [97.8 F (36.6 C)-99 F (37.2 C)] 99 F (37.2 C) (05/21 0540) Pulse Rate:  [65-100] 100 (05/21 0540) Resp:  [14-22] 18 (05/21 0540) BP: (84-187)/(59-101) 141/78 mmHg (05/21 0540) SpO2:  [90 %-99 %] 91 % (05/21 0540) Weight:  [100.699 kg (222 lb)-102.967 kg (227 lb)] 100.699 kg (222 lb) (05/21 0023) Weight change:  Last BM Date: 05/06/13  Intake/Output from previous day: 05/20 0701 - 05/21 0700 In: 658.8 [I.V.:658.8] Out: -      Physical Exam: General: Comfortable, alert, communicative, fully oriented, not short of breath at rest.  HEENT:  No clinical pallor, no jaundice, no conjunctival injection or discharge. Hydration is satisfactory.  NECK:  Supple, JVP not seen, no carotid bruits, no palpable lymphadenopathy, no palpable goiter. CHEST:  Clinically clear to auscultation, no wheezes, no crackles. HEART:  Sounds 1 and 2 heard, normal, regular, no murmurs. ABDOMEN:  Full, soft, non-tender, no palpable organomegaly, no palpable masses, normal  bowel sounds. GENITALIA:  Not examuined. LOWER EXTREMITIES:  No pitting edema, palpable peripheral pulses. MUSCULOSKELETAL SYSTEM:  Generalized osteoarthritic changes, otherwise, normal. CENTRAL NERVOUS SYSTEM:  No focal neurologic deficit on gross examination.  Lab  Results:  Recent Labs  05/08/13 1714 05/09/13 0120  WBC 7.7 7.3  HGB 13.3 13.6  HCT 39.6 41.1  PLT 243 247    Recent Labs  05/08/13 1714 05/09/13 0120  NA 139 140  K 3.3* 4.2  CL 98 98  CO2 30 33*  GLUCOSE 121* 110*  BUN 17 17  CREATININE 0.83 0.76  CALCIUM 8.6 8.8   No results found for this or any previous visit (from the past 240 hour(s)).   Studies/Results: Dg Chest 1 View  05/08/2013   *RADIOLOGY REPORT*  Clinical Data: Dizziness.  Generalized weakness.  Shortness of breath.  CHEST - 1 VIEW  Comparison: Two-view chest x-ray 01/25/2010.  Findings: Cardiac silhouette normal in size, unchanged.  Thoracic aorta mildly atherosclerotic, unchanged.  Hilar and mediastinal contours otherwise unremarkable.  Emphysematous changes in the upper lobes with vascular redistribution to the mid and lower lungs.  Mildly prominent bronchovascular markings diffusely mild central peribronchial thickening, unchanged.  Lungs otherwise clear.  No visible pleural effusions.  IMPRESSION: COPD/emphysema.  No acute cardiopulmonary disease.   Original Report Authenticated By: Evangeline Dakin, M.D.   Ct Angio Chest Pe W/cm &/or Wo Cm  05/08/2013   *RADIOLOGY REPORT*  Clinical Data: Hypoxia.  Elevated D-dimer.  Shortness of breath. Dizziness.  CT ANGIOGRAPHY CHEST  Technique:  Multidetector CT imaging of the chest using the standard protocol during bolus administration of intravenous contrast. Multiplanar reconstructed images including MIPs were obtained and reviewed to evaluate the vascular anatomy.  Contrast: 179mL OMNIPAQUE IOHEXOL 350 MG/ML SOLN  Comparison: Multiple prior chest CTs.  Findings: The chest wall is unremarkable.  No supraclavicular or axillary mass or adenopathy.  The thyroid gland is normal.  The bony thorax is intact.  No destructive bone lesions or spinal canal compromise.  Minimal degenerative changes involving the thoracic spine for age.  Remote rib fractures are noted.  The heart is  normal in size.  No pericardial effusion.  No mediastinal or hilar lymphadenopathy.  The aorta is normal in caliber.  Moderate atherosclerotic calcifications.  No dissection. Three-vessel coronary artery calcifications are noted.  The esophagus is grossly normal.  The pulmonary arterial tree is fairly well opacified.  No filling defects are seen to suggest pulmonary emboli.  Examination of the lung parenchyma demonstrates advanced emphysematous changes and there is a pulmonary scarring.  No infiltrates, edema or effusions.  No worrisome pulmonary mass lesions or pulmonary nodules.  Mild peribronchial thickening.  The upper abdomen is unremarkable.  There is diffuse fatty infiltration of the liver and fatty change in the falciform ligament.  Upper abdominal atherosclerotic changes are noted but no focal aneurysm or dissection.  IMPRESSION:  1.  No CT findings for pulmonary embolism. 2.  Moderate atherosclerotic calcifications involving the aorta but no focal aneurysm or dissection. 3.  Three-vessel coronary artery calcifications. 4.  Advanced emphysematous changes and pulmonary scarring but no acute pulmonary findings or worrisome pulmonary lesions.   Original Report Authenticated By: Marijo Sanes, M.D.    Medications: Scheduled Meds: . aspirin EC  81 mg Oral q morning - 10a  . cephALEXin  1,000 mg Oral BID  . enoxaparin (LOVENOX) injection  40 mg Subcutaneous Q24H  . ketoconazole  1 application Topical Daily  . lisinopril  10 mg Oral  q morning - 10a  . sodium chloride  3 mL Intravenous Q12H   Continuous Infusions:  PRN Meds:.acetaminophen, acetaminophen, ondansetron (ZOFRAN) IV, ondansetron    LOS: 1 day   Nickolas Chalfin,CHRISTOPHER  [facility] Pager [phone]. If 8PM-8AM, please contact night-coverage at [email or web address], password [facility] 05/09/2013, 11:18 AM  LOS: 1 day

## 2013-05-09 NOTE — Progress Notes (Signed)
Patient's BP has been elevated throughout the shift with systolic in the [id number]. IV hydralazine was given and 2208 and BP went down to a systolic of the [id number] after 30-45 minutes had gone by. NP, Baltazar Najjar made aware and does not wish to give any additional treatment at this time as the patient was originally admitted for hypotension. Will continue to watch BP closely and notify NP of any significant changes. Marita Snellen

## 2013-05-09 NOTE — Progress Notes (Signed)
Pt had 5 beats of VTACH. MD notified. Will continue to monitor. VSS. Denies chest pain. Pt asymptomatic.

## 2013-05-10 LAB — BASIC METABOLIC PANEL
CO2: 31 mEq/L (ref 19–32)
Calcium: 8.7 mg/dL (ref 8.4–10.5)
Chloride: 101 mEq/L (ref 96–112)
Glucose, Bld: 103 mg/dL — ABNORMAL HIGH (ref 70–99)
Potassium: 3.4 mEq/L — ABNORMAL LOW (ref 3.5–5.1)
Sodium: 142 mEq/L (ref 135–145)

## 2013-05-10 LAB — CBC
Hemoglobin: 12.5 g/dL — ABNORMAL LOW (ref 13.0–17.0)
MCH: 36.4 pg — ABNORMAL HIGH (ref 26.0–34.0)
Platelets: 236 10*3/uL (ref 150–400)
RBC: 3.43 MIL/uL — ABNORMAL LOW (ref 4.22–5.81)
WBC: 7.6 10*3/uL (ref 4.0–10.5)

## 2013-05-10 MED ORDER — ALBUTEROL SULFATE HFA 108 (90 BASE) MCG/ACT IN AERS: 2.0000 | INHALATION_SPRAY | Freq: Two times a day (BID) | RESPIRATORY_TRACT | Status: DC

## 2013-05-10 MED ORDER — FLUCONAZOLE 100 MG PO TABS: 100.0000 mg | ORAL_TABLET | Freq: Every day | ORAL | Status: DC

## 2013-05-10 MED ORDER — AMLODIPINE BESYLATE 10 MG PO TABS: 10.0000 mg | ORAL_TABLET | Freq: Every day | ORAL | Status: DC

## 2013-05-10 MED ORDER — ALBUTEROL SULFATE HFA 108 (90 BASE) MCG/ACT IN AERS: 2.0000 | INHALATION_SPRAY | RESPIRATORY_TRACT | Status: DC | PRN

## 2013-05-10 MED ORDER — HYDRALAZINE HCL 20 MG/ML IJ SOLN: 10.0000 mg | Freq: Four times a day (QID) | INTRAMUSCULAR | Status: DC | PRN

## 2013-05-10 MED ORDER — POTASSIUM CHLORIDE CRYS ER 20 MEQ PO TBCR
40.0000 meq | EXTENDED_RELEASE_TABLET | Freq: Once | ORAL | Status: AC
  Administered 2013-05-10: 40 meq via ORAL
  Filled 2013-05-10: qty 2

## 2013-05-10 MED ORDER — LISINOPRIL 20 MG PO TABS: 20.0000 mg | ORAL_TABLET | Freq: Every morning | ORAL | Status: DC

## 2013-05-10 MED ORDER — TIOTROPIUM BROMIDE MONOHYDRATE 18 MCG IN CAPS: 18.0000 ug | ORAL_CAPSULE | Freq: Every day | RESPIRATORY_TRACT | Status: DC

## 2013-05-10 MED ORDER — AMLODIPINE BESYLATE 10 MG PO TABS
10.0000 mg | ORAL_TABLET | Freq: Every day | ORAL | Status: DC
  Administered 2013-05-10: 10 mg via ORAL
  Filled 2013-05-10: qty 1

## 2013-05-10 NOTE — Progress Notes (Signed)
BP retaken at 0630 to be 169/79, NP Baltazar Najjar notified and is satisfied with this value for now, decided to change the parameters on the IV hydralazine. Will continue to monitor. Marita Snellen

## 2013-05-10 NOTE — Evaluation (Signed)
Physical Therapy One Time Evaluation Patient Details Name: David Barton MRN: [id number] DOB: [birth year] Today's Date: 05/10/2013 Time: [id number] PT Time Calculation (min): 11 min  PT Assessment / Plan / Recommendation Clinical Impression  Pt is a 79 year old male admitted for syncope, hypotension, and hypoxia.  Pt requested PT evaluation to provide recommendations for physical activity at home as he reports he wants to be active however not overexert himself.  PT explained HHPT role in coming to house and evaluation pt, HHPT brings equipment to monitor vitals and provide physical activity guidelines (target HR and monitoring SOB).  Also explained outpatient PT however pt reports he would like PT to come to house.  Pt d/c home and HHPT to be ordered (discussed with RN) so will sign off at this time.    PT Assessment  Patient needs continued PT services    Follow Up Recommendations  Home health PT    Does the patient have the potential to tolerate intense rehabilitation      Barriers to Discharge        Equipment Recommendations  None recommended by PT    Recommendations for Other Services     Frequency      Precautions / Restrictions Precautions Precautions: None   Pertinent Vitals/Pain n/a      Mobility  Bed Mobility Bed Mobility: Not assessed Transfers Transfers: Sit to Stand;Stand to Sit Sit to Stand: 7: Independent Stand to Sit: 7: Independent Ambulation/Gait Ambulation/Gait Assistance: 7: Independent Ambulation Distance (Feet): 400 Feet Assistive device: None Ambulation/Gait Assistance Details: SOB with walking and talking, no unsteady gait Gait Pattern: Within Functional Limits    Exercises     PT Diagnosis:    PT Problem List: Decreased activity tolerance;Obesity PT Treatment Interventions:     PT Goals    Visit Information  Last PT Received On: 05/10/13 Assistance Needed: +1    Subjective Data  Subjective: I want to be active but I don't want  to do too much (overexertion) Patient Stated Goal: would like HHPT   Prior [place] Lives With: Spouse Type of Home: House Prior Function Level of Independence: Independent Communication Communication: No difficulties    Cognition  Cognition Arousal/Alertness: Awake/alert Behavior During Therapy: WFL for tasks assessed/performed Overall Cognitive Status: Within Functional Limits for tasks assessed    Extremity/Trunk Assessment Right Upper Extremity Assessment RUE ROM/Strength/Tone: [facility] for tasks assessed Left Upper Extremity Assessment LUE ROM/Strength/Tone: WFL for tasks assessed Right Lower Extremity Assessment RLE ROM/Strength/Tone: [facility] for tasks assessed Left Lower Extremity Assessment LLE ROM/Strength/Tone: WFL for tasks assessed   Balance    End of Session PT - End of Session Activity Tolerance: Patient tolerated treatment well Patient left: in chair;with call bell/phone within reach Nurse Communication: Other (comment) (HHPT for d/c)  GP Functional Assessment Tool Used: clinical judgement Functional Limitation: Mobility: Walking and moving around Mobility: Walking and Moving Around Current Status [id number]): 0 percent impaired, limited or restricted Mobility: Walking and Moving Around Goal Status [id number]): 0 percent impaired, limited or restricted Mobility: Walking and Moving Around Discharge Status [phone]): 0 percent impaired, limited or restricted   David Barton,David Barton 05/10/2013, 3:18 PM David Barton, PT, DPT 05/10/2013 Pager: [phone]

## 2013-05-10 NOTE — Discharge Summary (Signed)
Physician Discharge Summary  David Barton [id number] DOB: [birth year] DOA: 05/08/2013  PCP: Gennette Pac, MD  Admit date: 05/08/2013 Discharge date: 05/10/2013  Time spent: 40 minutes  Recommendations for Outpatient Follow-up:  1. Follow up with PMD.   Discharge Diagnoses:  Principal Problem:   Dizziness Active Problems:   Hypokalemia   Hypotension   Hypoxia   Acute respiratory failure   Discharge Condition: Satisfactory.   Diet recommendation: Heart-Healthy.   Filed Weights   05/08/13 1700 05/09/13 0023  Weight: 102.967 kg (227 lb) 100.699 kg (222 lb)    History of present illness:  79 y.o. Male, with no significant past medical history, who saw his PCP on 05/07/13 with an infection under his arm and a painful neck. He was given 2 prescriptions, one for Flexeril and another one he does not recall the name of. On 05/08/13, while sitting and watching TV, he felt lightheaded, without vertigo, and he "just felt "weird". He called his son, who called EMS. His SBP was in the 80s when they checked. He was brought to the ER for further evaluation. BP back to normal after IVF. His O2 sats were also found to be low in the 80s on RA- patient does not recall his PCP ever checking his O2 sats. Admitted for further management.    Hospital Course:  1. Dizziness/Hypotension: Patient presented with dizziness, without headache or vertigo. SBP was reportedly in the 80s, but responded to iv fluid bolus, administered in the ED. Patient has remained asymptomatic since.This presentation was likely due to volume depletion and medication side effect, with Flexeril, as the likely culprit. Flexeril was discontinued. Cardiac enzymes remained unelevated, and no arrhythmias were recorded on telemetric monitoring. As BP started creeping up on 05/09/13, pre-admission ACE-i was reinstated, as well as Norvasc.  2. Hypokalemia: Mild, repleted as indicated.  3. Intertrigo right axilla: Patient has had  progressive redness right axillary region, foe almost 2 weeks, and was commenced by PMD on Keflex, on 05/07/13, possibly for secondary infection, as well as an antifungal cream. This antibiotic was continued during hospitalization, ad a 7-day course of Diflucan added, to be concluded on 05/05/13. Already, as of 05/10/13, improvement was evident.  4. Query UTI: Urine sediment revealed mild pyuria. Significance is uncertain, but as patient was already on a cephalosporin, no additional antibiotic was deemed necessary.  5. Hypoxia/COPD: This was an incidental finding on initial evaluation, with O2 sats in the 80s on RA. Imaging studies revealed COPD/Emphysema changes, but no acute findings, and CTA was negative for PE. Patient quit smoking over 2 decades ago. He had no wheezes on physical examination. Spiriva and Albuterol inhaler, were commenced, and patient successfully weaned of oxygen supplements. As of 05/10/13, he was saturating at 90%-95% on RA, even on ambulation.    Procedures:  See Below.   Consultations:  N/A.   Discharge Exam: Filed Vitals:   05/10/13 0545 05/10/13 0629 05/10/13 0748 05/10/13 1006  BP: 163/104 169/79    Pulse: 92 85    Temp: 99.6 F (37.6 C)     TempSrc: Oral     Resp: 18     Height:      Weight:      SpO2: 92%  90% 94%    General: Comfortable, alert, communicative, fully oriented, not short of breath at rest.  HEENT: No clinical pallor, no jaundice, no conjunctival injection or discharge. Hydration is satisfactory.  NECK: Supple, JVP not seen, no carotid bruits, no palpable lymphadenopathy, no palpable  goiter.  CHEST: Clinically clear to auscultation, no wheezes, no crackles.  HEART: Sounds 1 and 2 heard, normal, regular, no murmurs.  ABDOMEN: Full, soft, non-tender, no palpable organomegaly, no palpable masses, normal bowel sounds.  GENITALIA: Not examuined.  UPPER EXTREMITIES: Rt axillar erythema has improved.  LOWER EXTREMITIES: No pitting edema, palpable  peripheral pulses.  MUSCULOSKELETAL SYSTEM: Generalized osteoarthritic changes, otherwise, normal.  CENTRAL NERVOUS SYSTEM: No focal neurologic deficit on gross examination.  Discharge Instructions      Discharge Orders   Future Orders Complete By Expires     Diet - low sodium heart healthy  As directed     Increase activity slowly  As directed         Medication List    STOP taking these medications       cyclobenzaprine 10 MG tablet  Commonly known as:  FLEXERIL      TAKE these medications       albuterol 108 (90 BASE) MCG/ACT inhaler  Commonly known as:  PROVENTIL HFA;VENTOLIN HFA  Inhale 2 puffs into the lungs 2 (two) times daily.     amLODipine 10 MG tablet  Commonly known as:  NORVASC  Take 1 tablet (10 mg total) by mouth daily.     aspirin EC 81 MG tablet  Take 81 mg by mouth every morning.     augmented betamethasone dipropionate 0.05 % cream  Commonly known as:  DIPROLENE-AF  Apply 1 application topically daily.     cephALEXin 500 MG capsule  Commonly known as:  KEFLEX  Take 1,000 mg by mouth 2 (two) times daily.     fluconazole 100 MG tablet  Commonly known as:  DIFLUCAN  Take 1 tablet (100 mg total) by mouth daily.     ibuprofen 800 MG tablet  Commonly known as:  ADVIL,MOTRIN  Take 800 mg by mouth 3 (three) times daily as needed for pain.     ketoconazole 2 % cream  Commonly known as:  NIZORAL  Apply 1 application topically daily.     lisinopril 20 MG tablet  Commonly known as:  PRINIVIL,ZESTRIL  Take 1 tablet (20 mg total) by mouth every morning.     tiotropium 18 MCG inhalation capsule  Commonly known as:  SPIRIVA  Place 1 capsule (18 mcg total) into inhaler and inhale daily.       No Known Allergies Follow-up Information   Call Gennette Pac, MD.   Contact information:   [address] [address] [phone]        The results of significant diagnostics from this hospitalization (including imaging,  microbiology, ancillary and laboratory) are listed below for reference.    Significant Diagnostic Studies: Dg Chest 1 View  05/08/2013   *RADIOLOGY REPORT*  Clinical Data: Dizziness.  Generalized weakness.  Shortness of breath.  CHEST - 1 VIEW  Comparison: Two-view chest x-ray 01/25/2010.  Findings: Cardiac silhouette normal in size, unchanged.  Thoracic aorta mildly atherosclerotic, unchanged.  Hilar and mediastinal contours otherwise unremarkable.  Emphysematous changes in the upper lobes with vascular redistribution to the mid and lower lungs.  Mildly prominent bronchovascular markings diffusely mild central peribronchial thickening, unchanged.  Lungs otherwise clear.  No visible pleural effusions.  IMPRESSION: COPD/emphysema.  No acute cardiopulmonary disease.   Original Report Authenticated By: Evangeline Dakin, M.D.   Ct Angio Chest Pe W/cm &/or Wo Cm  05/08/2013   *RADIOLOGY REPORT*  Clinical Data: Hypoxia.  Elevated D-dimer.  Shortness of breath. Dizziness.  CT  ANGIOGRAPHY CHEST  Technique:  Multidetector CT imaging of the chest using the standard protocol during bolus administration of intravenous contrast. Multiplanar reconstructed images including MIPs were obtained and reviewed to evaluate the vascular anatomy.  Contrast: 165mL OMNIPAQUE IOHEXOL 350 MG/ML SOLN  Comparison: Multiple prior chest CTs.  Findings: The chest wall is unremarkable.  No supraclavicular or axillary mass or adenopathy.  The thyroid gland is normal.  The bony thorax is intact.  No destructive bone lesions or spinal canal compromise.  Minimal degenerative changes involving the thoracic spine for age.  Remote rib fractures are noted.  The heart is normal in size.  No pericardial effusion.  No mediastinal or hilar lymphadenopathy.  The aorta is normal in caliber.  Moderate atherosclerotic calcifications.  No dissection. Three-vessel coronary artery calcifications are noted.  The esophagus is grossly normal.  The pulmonary  arterial tree is fairly well opacified.  No filling defects are seen to suggest pulmonary emboli.  Examination of the lung parenchyma demonstrates advanced emphysematous changes and there is a pulmonary scarring.  No infiltrates, edema or effusions.  No worrisome pulmonary mass lesions or pulmonary nodules.  Mild peribronchial thickening.  The upper abdomen is unremarkable.  There is diffuse fatty infiltration of the liver and fatty change in the falciform ligament.  Upper abdominal atherosclerotic changes are noted but no focal aneurysm or dissection.  IMPRESSION:  1.  No CT findings for pulmonary embolism. 2.  Moderate atherosclerotic calcifications involving the aorta but no focal aneurysm or dissection. 3.  Three-vessel coronary artery calcifications. 4.  Advanced emphysematous changes and pulmonary scarring but no acute pulmonary findings or worrisome pulmonary lesions.   Original Report Authenticated By: Marijo Sanes, M.D.    Microbiology: Recent Results (from the past 240 hour(s))  URINE CULTURE     Status: None   Collection Time    05/08/13  5:38 PM      Result Value Range Status   Specimen Description URINE, CLEAN CATCH   Final   Special Requests NONE   Final   Culture  Setup Time 05/09/2013 00:47   Final   Colony Count NO GROWTH   Final   Culture NO GROWTH   Final   Report Status 05/09/2013 FINAL   Final     Labs: Basic Metabolic Panel:  Recent Labs Lab 05/08/13 1714 05/09/13 0120 05/10/13 0530  NA 139 140 142  K 3.3* 4.2 3.4*  CL 98 98 101  CO2 30 33* 31  GLUCOSE 121* 110* 103*  BUN 17 17 10   CREATININE 0.83 0.76 0.60  CALCIUM 8.6 8.8 8.7   Liver Function Tests:  Recent Labs Lab 05/08/13 1714  AST 51*  ALT 18  ALKPHOS 80  BILITOT 0.4  PROT 6.3  ALBUMIN 2.7*   No results found for this basename: LIPASE, AMYLASE,  in the last 168 hours No results found for this basename: AMMONIA,  in the last 168 hours CBC:  Recent Labs Lab 05/08/13 1714 05/09/13 0120  05/10/13 0530  WBC 7.7 7.3 7.6  NEUTROABS 5.1  --   --   HGB 13.3 13.6 12.5*  HCT 39.6 41.1 38.9*  MCV 112.8* 113.5* 113.4*  PLT 243 247 236   Cardiac Enzymes:  Recent Labs Lab 05/08/13 1714 05/09/13 0120 05/09/13 0622  TROPONINI <0.30 <0.30 <0.30   BNP: BNP (last 3 results)  Recent Labs  05/08/13 2000  PROBNP 141.8   CBG: No results found for this basename: GLUCAP,  in the last 168 hours  Signed:  Breck [place],CHRISTOPHER  [facility] 05/10/2013, 11:46 AM

## 2013-05-10 NOTE — Progress Notes (Signed)
OT Cancellation Note  Patient Details Name: David Barton MRN: [id number] DOB: [birth year]   Cancelled Treatment:     Screened pt.  He is independent with ambulating and feels he will be able to do basic adls and bathroom transfers.  He does have a grab bar in his shower.    [name] 05/10/2013, 3:30 PM [name], OTR/L [phone] 05/10/2013

## 2013-05-10 NOTE — Progress Notes (Signed)
Patient's BP was elevated again at 163/104. NP was notified and decided to hold PRN hydralazine for now and take BP again in one hour. Will continue to monitor and will reassess BP at 0630 AM. Darbie Biancardi, Annell Greening

## 2013-05-10 NOTE — Progress Notes (Signed)
BP rechecked at 2318 and had gone down to 152/86. Will continue to monitor. Marita Snellen

## 2013-05-29 ENCOUNTER — Ambulatory Visit (INDEPENDENT_AMBULATORY_CARE_PROVIDER_SITE_OTHER): Payer: Medicare Other | Admitting: Internal Medicine

## 2013-05-29 ENCOUNTER — Encounter: Payer: Self-pay | Admitting: Internal Medicine

## 2013-05-29 VITALS — BP 122/64 | HR 83 | Temp 97.7°F | Ht 68.25 in | Wt 212.2 lb

## 2013-05-29 DIAGNOSIS — I10 Essential (primary) hypertension: Secondary | ICD-10-CM

## 2013-05-29 DIAGNOSIS — J441 Chronic obstructive pulmonary disease with (acute) exacerbation: Secondary | ICD-10-CM | POA: Insufficient documentation

## 2013-05-29 DIAGNOSIS — J449 Chronic obstructive pulmonary disease, unspecified: Secondary | ICD-10-CM

## 2013-05-29 NOTE — Patient Instructions (Addendum)
Try off spiriva to see if you notice any difference in your exercise performance but it you want better get up and go, take daily.  Only use your albuterol(proaire) as a rescue medication to be used if you can't catch your breath by resting or doing a relaxed purse lip breathing pattern. The less you use it, the better it will work when you need it.   For now ok to continue lisinopril with a very low threshold to stop it if breathing gets worse or you start coughing.   Please schedule a follow up office visit in 6 weeks, call sooner if needed with pfts

## 2013-05-29 NOTE — Progress Notes (Signed)
  Subjective:    Patient ID: David Barton, male    DOB: 25-Jul-1933  MRN: [id number]  HPI  89 yowm quit smoking in 1980s and not aware of any problems very active only aware of breathing problems around 2007 after wt gain related to surgery referred by Dr Lawernce Pitts 05/29/2013 to pulmonary clinic for eval of transient hypoxemia assoc with presyncope:  Admit date: 05/08/2013  Discharge date: 05/10/2013  Time spent: 40 minutes  Recommendations for Outpatient Follow-up:  1. Follow up with PMD.  Discharge Diagnoses:  Principal Problem:  Dizziness  Active Problems:  Hypokalemia  Hypotension  Hypoxia  Acute respiratory failure    05/29/2013 1st pulmonary eval cc chronic doe long walk   needing meds then abruptly dizzy one day and low blood pressure > to ER 5/20 with eval with CTa c/w emphysema but no PE.   Was noted to be hypoxemic per sats but by discharge fully ambulatory with ok sats RA on spiriva and prn albuterol.  Despite asking questions various ways, unable to get her to tell me one activity where sob was really limiting her before or after her admit or whether or not she really feels better on spiriva or saba  No obvious daytime variabilty or assoc chronic cough or cp or chest tightness, subjective wheeze overt sinus or hb symptoms. No unusual exp hx or h/o childhood pna/ asthma or premature birth to her knowledge.   Sleeping ok without nocturnal  or early am exacerbation  of respiratory  c/o's or need for noct saba. Also denies any obvious fluctuation of symptoms with weather or environmental changes or other aggravating or alleviating factors except as outlined above     Review of Systems  Constitutional: Negative for fever, chills, activity change, appetite change and unexpected weight change.  HENT: Negative for congestion, sore throat, rhinorrhea, sneezing, trouble swallowing, dental problem, voice change and postnasal drip.   Eyes: Negative for visual disturbance.   Respiratory: Positive for shortness of breath. Negative for cough and choking.   Cardiovascular: Negative for chest pain and leg swelling.  Gastrointestinal: Negative for nausea, vomiting and abdominal pain.  Genitourinary: Negative for difficulty urinating.  Musculoskeletal: Negative for arthralgias.  Skin: Negative for rash.  Psychiatric/Behavioral: Negative for behavioral problems and confusion.       Objective:   Physical Exam  Wt Readings from Last 3 Encounters:  05/29/13 212 lb 3.2 oz (96.253 kg)  05/09/13 222 lb (100.699 kg)    HEENT mild turbinate edema.  Oropharynx no thrush or excess pnd or cobblestoning.  No JVD or cervical adenopathy. Mild accessory muscle hypertrophy. Trachea midline, nl thryroid. Chest was hyperinflated by percussion with diminished breath sounds and moderate increased exp time without wheeze. Hoover sign positive at mid inspiration. Regular rate and rhythm without murmur gallop or rub or increase P2 or edema.  Abd: no hsm, nl excursion. Ext warm without cyanosis or clubbing.      Chest CT 05/08/13 1. No CT findings for pulmonary embolism.  2. Moderate atherosclerotic calcifications involving the aorta but  no focal aneurysm or dissection.  3. Three-vessel coronary artery calcifications.  4. Advanced emphysematous changes and pulmonary scarring but no  acute pulmonary findings or worrisome pulmonary lesions      Assessment & Plan:

## 2013-05-31 ENCOUNTER — Encounter: Payer: Self-pay | Admitting: Internal Medicine

## 2013-05-31 DIAGNOSIS — I10 Essential (primary) hypertension: Secondary | ICD-10-CM | POA: Insufficient documentation

## 2013-05-31 NOTE — Assessment & Plan Note (Signed)
  When respiratory symptoms begin or become refractory well after a patient reports complete smoking cessation,  Especially when this wasn't the case while they were smoking, a red flag is raised based on the work of Dr Kris Mouton which states:  if you quit smoking when your best day FEV1 is still well preserved it is highly unlikely you will progress to severe disease.  That is to say, once the smoking stops,  the symptoms should not suddenly erupt or markedly worsen.  If so, the differential diagnosis should include  obesity/deconditioning,  LPR/Reflux/Aspiration syndromes,  occult CHF, or  especially side effect of medications commonly used in this population, especially ace inhibitors, so low treshold to d/c if starts having more atypical copd "spells'

## 2013-05-31 NOTE — Assessment & Plan Note (Signed)
As I explained to this patient in detail:  although there may be significant copd present, it does not appear to be limiting activity tolerance any more than a set of worn tires limits someone from driving a car  around a parking lot.  A new set of Michelins might look good but would have no perceived impact on the performance of the car and would not be worth the cost.  That is to say:   this pt is so sedentary I don't recommend aggressive pulmonary rx at this point unless limiting symptoms arise or acute exacerbations become as issue, neither of which are the case now.  I asked the patient to contact this office at any time in the future should either of these problems arise.    May do just as well using prn saba as taking LAMA longterm

## 2013-07-05 ENCOUNTER — Encounter (HOSPITAL_COMMUNITY): Payer: Self-pay

## 2013-07-05 ENCOUNTER — Emergency Department (HOSPITAL_COMMUNITY)
Admission: EM | Admit: 2013-07-05 | Discharge: 2013-07-06 | Disposition: A | Discharge status: Home / Self Care | Payer: Medicare Other | Attending: Emergency Medicine | Admitting: Emergency Medicine

## 2013-07-05 DIAGNOSIS — J449 Chronic obstructive pulmonary disease, unspecified: Secondary | ICD-10-CM

## 2013-07-05 DIAGNOSIS — Z79899 Other long term (current) drug therapy: Secondary | ICD-10-CM | POA: Insufficient documentation

## 2013-07-05 DIAGNOSIS — F1010 Alcohol abuse, uncomplicated: Secondary | ICD-10-CM | POA: Insufficient documentation

## 2013-07-05 DIAGNOSIS — Z85038 Personal history of other malignant neoplasm of large intestine: Secondary | ICD-10-CM | POA: Insufficient documentation

## 2013-07-05 DIAGNOSIS — Z7982 Long term (current) use of aspirin: Secondary | ICD-10-CM | POA: Insufficient documentation

## 2013-07-05 DIAGNOSIS — I959 Hypotension, unspecified: Secondary | ICD-10-CM | POA: Insufficient documentation

## 2013-07-05 DIAGNOSIS — J441 Chronic obstructive pulmonary disease with (acute) exacerbation: Secondary | ICD-10-CM | POA: Insufficient documentation

## 2013-07-05 DIAGNOSIS — I10 Essential (primary) hypertension: Secondary | ICD-10-CM | POA: Insufficient documentation

## 2013-07-05 DIAGNOSIS — Z87891 Personal history of nicotine dependence: Secondary | ICD-10-CM | POA: Insufficient documentation

## 2013-07-05 DIAGNOSIS — F10220 Alcohol dependence with intoxication, uncomplicated: Secondary | ICD-10-CM

## 2013-07-05 HISTORY — DX: Chronic obstructive pulmonary disease, unspecified: J44.9

## 2013-07-05 MED ORDER — ALBUTEROL SULFATE (5 MG/ML) 0.5% IN NEBU
2.5000 mg | INHALATION_SOLUTION | Freq: Once | RESPIRATORY_TRACT | Status: AC
  Administered 2013-07-06: 2.5 mg via RESPIRATORY_TRACT
  Filled 2013-07-05: qty 0.5

## 2013-07-05 MED ORDER — IPRATROPIUM BROMIDE 0.02 % IN SOLN
0.5000 mg | Freq: Once | RESPIRATORY_TRACT | Status: AC
  Administered 2013-07-06: 0.5 mg via RESPIRATORY_TRACT
  Filled 2013-07-05: qty 2.5

## 2013-07-05 NOTE — ED Notes (Signed)
Pt states checks his bp on normal basis.  Checked it at home tonight and noted 90/56.  Was treated in may for hypotension.  Pt has also had 3 drinks tonight.  In triage, noted sats to be low 88-92 %.  Pt sees pulmonary and may have COPD.  No oxygen at home.

## 2013-07-05 NOTE — ED Notes (Signed)
Placed pt on 2l per [place] for sats 88%.

## 2013-07-06 ENCOUNTER — Emergency Department (HOSPITAL_COMMUNITY): Payer: Medicare Other

## 2013-07-06 MED ORDER — DOXYCYCLINE HYCLATE 100 MG PO CAPS: 100.0000 mg | ORAL_CAPSULE | Freq: Two times a day (BID) | ORAL | Status: DC

## 2013-07-06 MED ORDER — PREDNISONE 20 MG PO TABS: ORAL_TABLET | ORAL | Status: DC

## 2013-07-06 NOTE — ED Notes (Signed)
Patient transported to X-ray 

## 2013-07-06 NOTE — ED Notes (Signed)
Pt endorses drinking 3 small shots of bourbon this evening.  Pt's breath has small hint of ETOH smell.

## 2013-07-06 NOTE — ED Notes (Signed)
Pt's O2 sat was 91% while ambulating without any O2.

## 2013-07-06 NOTE — ED Notes (Addendum)
Pt still 88% on RA after breathing tx but pt is not in acute respiratory distress.

## 2013-07-06 NOTE — ED Provider Notes (Signed)
History    CSN: [id number] Arrival date & time 07/05/13  2236  First MD Initiated Contact with Patient 07/05/13 2342     Chief Complaint  Patient presents with  . Hypotension  . hypoxia    (Consider location/radiation/quality/duration/timing/severity/associated sxs/prior Treatment) Patient is a 79 y.o. male presenting with intoxication. The history is provided by the patient.  Alcohol Intoxication This is a chronic problem. The current episode started more than 1 week ago. The problem occurs constantly. The problem has not changed since onset.Pertinent negatives include no chest pain, no abdominal pain, no headaches and no shortness of breath. Nothing aggravates the symptoms. Nothing relieves the symptoms. He has tried nothing for the symptoms. The treatment provided no relief.  Wife took his BP while he was drinking bourbon and waters, patient reports he was drinking 3 and it was in the 90s so she sent him in.  No CP no SOB no DOE.  No n/v/d.  No f/c/r.  No cough.  BP better without intervention.  Known COPD with low O2 sat Past Medical History  Diagnosis Date  . Hypertension   . Colon cancer   . COPD (chronic obstructive pulmonary disease)    Past Surgical History  Procedure Laterality Date  . Colon resection     Family History  Problem Relation Age of Onset  . Colon cancer Mother     with mets to liver  . Lung cancer Brother     was a smoker   History  Substance Use Topics  . Smoking status: Former Smoker -- 1.00 packs/day for 20 years    Types: Cigarettes    Quit date: 12/20/1981  . Smokeless tobacco: Not on file  . Alcohol Use: 3.5 oz/week    7 drink(s) per week     Comment: one drink per day    Review of Systems  Respiratory: Negative for shortness of breath.   Cardiovascular: Negative for chest pain.  Gastrointestinal: Negative for abdominal pain.  Neurological: Negative for headaches.  All other systems reviewed and are negative.    Allergies  Review  of patient's allergies indicates no known allergies.  Home Medications   Current Outpatient Rx  Name  Route  Sig  Dispense  Refill  . albuterol (PROAIR HFA) 108 (90 BASE) MCG/ACT inhaler   Inhalation   Inhale 2 puffs into the lungs every 6 (six) hours as needed.         Marland Kitchen amLODipine (NORVASC) 10 MG tablet   Oral   Take 1 tablet (10 mg total) by mouth daily.   30 tablet   0   . aspirin EC 81 MG tablet   Oral   Take 81 mg by mouth every morning.         Marland Kitchen lisinopril (PRINIVIL,ZESTRIL) 20 MG tablet   Oral   Take 1 tablet (20 mg total) by mouth every morning.   30 tablet   0   . tiotropium (SPIRIVA) 18 MCG inhalation capsule   Inhalation   Place 1 capsule (18 mcg total) into inhaler and inhale daily.   30 capsule   12    BP 131/56  Pulse 95  Temp(Src) 97.9 F (36.6 C) (Oral)  Resp 18  SpO2 91% Physical Exam  Constitutional: He is oriented to person, place, and time. He appears well-developed and well-nourished. No distress.  HENT:  Head: Normocephalic and atraumatic.  Mouth/Throat: Oropharynx is clear and moist.  Eyes: Conjunctivae are normal. Pupils are equal, round, and reactive  to light.  Neck: Normal range of motion. Neck supple.  Cardiovascular: Normal rate, regular rhythm and intact distal pulses.   Pulmonary/Chest: Effort normal. He has decreased breath sounds. He has wheezes.  Abdominal: Soft. Bowel sounds are normal. There is no tenderness. There is no rebound and no guarding.  Musculoskeletal: Normal range of motion. He exhibits no edema and no tenderness.  Neurological: He is alert and oriented to person, place, and time.  Skin: Skin is warm and dry.  Psychiatric: He has a normal mood and affect.    ED Course  Procedures (including critical care time) Labs Reviewed - No data to display No results found. No diagnosis found.  MDM  Sat 96% on room air without symptoms.  Will prescribe doxycycline and prednisone as is related to COPD.  No CP no SOB  no DOE  Zymeir Salminen K Jeanpaul Biehl-Rasch, MD 07/06/13 [phone]

## 2013-07-16 ENCOUNTER — Encounter: Payer: Self-pay | Admitting: Internal Medicine

## 2013-07-16 ENCOUNTER — Ambulatory Visit (INDEPENDENT_AMBULATORY_CARE_PROVIDER_SITE_OTHER): Payer: Medicare Other | Admitting: Internal Medicine

## 2013-07-16 VITALS — BP 142/80 | HR 120 | Temp 98.0°F | Ht 69.0 in | Wt 222.0 lb

## 2013-07-16 DIAGNOSIS — J449 Chronic obstructive pulmonary disease, unspecified: Secondary | ICD-10-CM

## 2013-07-16 DIAGNOSIS — I10 Essential (primary) hypertension: Secondary | ICD-10-CM

## 2013-07-16 LAB — PULMONARY FUNCTION TEST

## 2013-07-16 MED ORDER — OLMESARTAN MEDOXOMIL 20 MG PO TABS: ORAL_TABLET | ORAL | Status: DC

## 2013-07-16 NOTE — Progress Notes (Signed)
PFT done today. 

## 2013-07-16 NOTE — Progress Notes (Signed)
Subjective:    Patient ID: David Barton, male    DOB: Mar 06, 1933  MRN: [id number]   Brief patient profile:  60 yowm quit smoking in 1980s and not aware of any problems very active only aware of breathing problems around 2007 after wt gain related to surgery referred by Dr Lawernce Pitts 05/29/2013 to pulmonary clinic for eval of transient hypoxemia assoc with presyncope:  Admit date: 05/08/2013  Discharge date: 05/10/2013 Discharge Diagnoses:  Principal Problem:  Dizziness  Active Problems:  Hypokalemia  Hypotension  Hypoxia  Acute respiratory failure    05/29/2013 1st pulmonary eval cc chronic doe long walk  needing meds then abruptly dizzy one day and low blood pressure > to ER 5/20 with eval with CTa c/w emphysema but no PE.   Was noted to be hypoxemic per sats but by discharge fully ambulatory with ok sats RA on spiriva and prn albuterol. rec Try off spiriva to see if you notice any difference in your exercise performance but it you want better get up and go, take daily. Only use your albuterol(proaire) as a rescue medication to be used if you can't catch your breath by resting or doing a relaxed purse lip breathing pattern. The less you use it, the better it will work when you need it.  For now ok to continue lisinopril with a very low threshold to stop it if breathing gets worse or you start coughing.    07/16/2013 f/u ov/David Barton re COPD GOLD II Chief Complaint  Patient presents with  . Follow-up with PFT    Pt states that his breathing has improved, exercising daily and feels this has helped. No new co's today.   started back on spiriva but not needed alb maint c/o upper airway "congestion" and hoarseness on acei still    Despite asking questions various ways, unable to get her to tell me one activity where sob was really limiting her before or after her admit or whether or not she really feels better on spiriva or saba  No obvious daytime variabilty or assoc chronic cough or cp  or chest tightness, subjective wheeze overt sinus or hb symptoms. No unusual exp hx or h/o childhood pna/ asthma or premature birth to her knowledge.   Sleeping ok without nocturnal  or early am exacerbation  of respiratory  c/o's or need for noct saba. Also denies any obvious fluctuation of symptoms with weather or environmental changes or other aggravating or alleviating factors except as outlined above           Objective:   Physical Exam  Pleasant hoarse wm nad   07/16/2013       222  Wt Readings from Last 3 Encounters:  05/29/13 212 lb 3.2 oz (96.253 kg)  05/09/13 222 lb (100.699 kg)    HEENT mild turbinate edema.  Oropharynx no thrush or excess pnd or cobblestoning.  No JVD or cervical adenopathy. Mild accessory muscle hypertrophy. Trachea midline, nl thryroid. Chest was hyperinflated by percussion with diminished breath sounds and moderate increased exp time without wheeze. Hoover sign positive at mid inspiration. Regular rate and rhythm without murmur gallop or rub or increase P2 or edema.  Abd: no hsm, nl excursion. Ext warm without cyanosis or clubbing.      Chest CT 05/08/13 1. No CT findings for pulmonary embolism.  2. Moderate atherosclerotic calcifications involving the aorta but  no focal aneurysm or dissection.  3. Three-vessel coronary artery calcifications.  4. Advanced emphysematous changes and pulmonary  scarring but no  acute pulmonary findings or worrisome pulmonary lesions      Assessment & Plan:

## 2013-07-16 NOTE — Assessment & Plan Note (Signed)
-   PFTs 07/16/2013  FEV1  1.75 (61%) ratio 52 and 15% better p B2, DLCO 74%  Adequate control on present rx :  reviewed each maintenance medication  in detail including most importantly the difference between maintenance and as needed and under what circumstances the prns are to be used.  Please see instructions for details which were reviewed in writing and the patient given a copy.

## 2013-07-16 NOTE — Assessment & Plan Note (Signed)
ACE inhibitors are problematic in  pts with airway complaints because  even experienced pulmonologists can't always distinguish ace effects from copd/asthma.  By themselves they don't actually cause a problem, much like oxygen can't by itself start a fire, but they certainly serve as a powerful catalyst or enhancer for any "fire"  or inflammatory process in the upper airway, be it caused by an ET  tube or more commonly reflux (especially in the obese or pts with known GERD or who are on biphoshonates).    In the era of ARB near equivalency until we have a better handle on the reversibility of the airway problem, it just makes sense to avoid ACEI  entirely in the short run and then decide later, having established a level of airway control using a reasonable limited regimen, whether to add back ace but even then being very careful to observe the pt for worsening airway control and number of meds used/ needed to control symptoms.   Will try benicar 40 one half daily to see to what extent it helps his hoarseness and throat "congestion"

## 2013-07-16 NOTE — Patient Instructions (Addendum)
Continue spiriva daily  Stop lisinopril and start benicar 40  mg one half daily  daily   Please schedule a follow up office visit in 6 weeks, call sooner if needed to see Tammy NP on return

## 2013-08-27 ENCOUNTER — Ambulatory Visit: Payer: Medicare Other | Admitting: Adult Health

## 2013-08-30 ENCOUNTER — Ambulatory Visit: Payer: Medicare Other | Admitting: Adult Health

## 2013-09-04 ENCOUNTER — Ambulatory Visit: Payer: Medicare Other | Admitting: Adult Health

## 2014-12-20 HISTORY — PX: IMPLANTATION BONE ANCHORED HEARING AID: SUR691

## 2015-01-09 ENCOUNTER — Other Ambulatory Visit: Payer: Self-pay | Admitting: Family Medicine

## 2015-01-09 DIAGNOSIS — R748 Abnormal levels of other serum enzymes: Secondary | ICD-10-CM

## 2015-01-16 ENCOUNTER — Ambulatory Visit
Admission: RE | Admit: 2015-01-16 | Discharge: 2015-01-16 | Disposition: A | Discharge status: Home / Self Care | Payer: Medicare Other | Source: Ambulatory Visit | Attending: Family Medicine | Admitting: Family Medicine

## 2015-01-16 DIAGNOSIS — R748 Abnormal levels of other serum enzymes: Secondary | ICD-10-CM

## 2016-03-17 ENCOUNTER — Inpatient Hospital Stay (HOSPITAL_COMMUNITY): Admission: RE | Admit: 2016-03-17 | Payer: Medicare Other | Source: Ambulatory Visit

## 2016-03-25 ENCOUNTER — Inpatient Hospital Stay: Admit: 2016-03-25 | Payer: Medicare Other | Admitting: Orthopedic Surgery

## 2016-03-25 SURGERY — ARTHROPLASTY, SHOULDER, TOTAL
Anesthesia: General | Site: Shoulder | Laterality: Right

## 2016-10-20 DIAGNOSIS — A419 Sepsis, unspecified organism: Secondary | ICD-10-CM

## 2016-10-20 HISTORY — DX: Sepsis, unspecified organism: A41.9

## 2016-11-03 ENCOUNTER — Emergency Department (HOSPITAL_COMMUNITY): Payer: Medicare Other

## 2016-11-03 ENCOUNTER — Inpatient Hospital Stay (HOSPITAL_COMMUNITY)
Admission: EM | Admit: 2016-11-03 | Discharge: 2016-11-20 | DRG: 871 | Disposition: A | Discharge status: Home Health | Payer: Medicare Other | Attending: Internal Medicine | Admitting: Internal Medicine

## 2016-11-03 DIAGNOSIS — I471 Supraventricular tachycardia: Secondary | ICD-10-CM | POA: Diagnosis present

## 2016-11-03 DIAGNOSIS — R748 Abnormal levels of other serum enzymes: Secondary | ICD-10-CM | POA: Diagnosis not present

## 2016-11-03 DIAGNOSIS — I420 Dilated cardiomyopathy: Secondary | ICD-10-CM | POA: Diagnosis not present

## 2016-11-03 DIAGNOSIS — A419 Sepsis, unspecified organism: Secondary | ICD-10-CM | POA: Diagnosis not present

## 2016-11-03 DIAGNOSIS — E861 Hypovolemia: Secondary | ICD-10-CM | POA: Diagnosis present

## 2016-11-03 DIAGNOSIS — R0600 Dyspnea, unspecified: Secondary | ICD-10-CM

## 2016-11-03 DIAGNOSIS — R778 Other specified abnormalities of plasma proteins: Secondary | ICD-10-CM

## 2016-11-03 DIAGNOSIS — J441 Chronic obstructive pulmonary disease with (acute) exacerbation: Secondary | ICD-10-CM | POA: Diagnosis present

## 2016-11-03 DIAGNOSIS — A414 Sepsis due to anaerobes: Principal | ICD-10-CM | POA: Diagnosis present

## 2016-11-03 DIAGNOSIS — Z885 Allergy status to narcotic agent status: Secondary | ICD-10-CM

## 2016-11-03 DIAGNOSIS — G4730 Sleep apnea, unspecified: Secondary | ICD-10-CM | POA: Diagnosis present

## 2016-11-03 DIAGNOSIS — Z87891 Personal history of nicotine dependence: Secondary | ICD-10-CM

## 2016-11-03 DIAGNOSIS — I272 Pulmonary hypertension, unspecified: Secondary | ICD-10-CM | POA: Diagnosis present

## 2016-11-03 DIAGNOSIS — I130 Hypertensive heart and chronic kidney disease with heart failure and stage 1 through stage 4 chronic kidney disease, or unspecified chronic kidney disease: Secondary | ICD-10-CM | POA: Diagnosis present

## 2016-11-03 DIAGNOSIS — K760 Fatty (change of) liver, not elsewhere classified: Secondary | ICD-10-CM | POA: Diagnosis present

## 2016-11-03 DIAGNOSIS — R262 Difficulty in walking, not elsewhere classified: Secondary | ICD-10-CM | POA: Diagnosis present

## 2016-11-03 DIAGNOSIS — J181 Lobar pneumonia, unspecified organism: Secondary | ICD-10-CM | POA: Diagnosis not present

## 2016-11-03 DIAGNOSIS — Z66 Do not resuscitate: Secondary | ICD-10-CM | POA: Diagnosis present

## 2016-11-03 DIAGNOSIS — Z09 Encounter for follow-up examination after completed treatment for conditions other than malignant neoplasm: Secondary | ICD-10-CM

## 2016-11-03 DIAGNOSIS — N289 Disorder of kidney and ureter, unspecified: Secondary | ICD-10-CM | POA: Diagnosis not present

## 2016-11-03 DIAGNOSIS — J439 Emphysema, unspecified: Secondary | ICD-10-CM | POA: Diagnosis present

## 2016-11-03 DIAGNOSIS — I10 Essential (primary) hypertension: Secondary | ICD-10-CM | POA: Diagnosis present

## 2016-11-03 DIAGNOSIS — J9600 Acute respiratory failure, unspecified whether with hypoxia or hypercapnia: Secondary | ICD-10-CM | POA: Diagnosis present

## 2016-11-03 DIAGNOSIS — I429 Cardiomyopathy, unspecified: Secondary | ICD-10-CM | POA: Diagnosis present

## 2016-11-03 DIAGNOSIS — F40240 Claustrophobia: Secondary | ICD-10-CM | POA: Diagnosis present

## 2016-11-03 DIAGNOSIS — J811 Chronic pulmonary edema: Secondary | ICD-10-CM

## 2016-11-03 DIAGNOSIS — J189 Pneumonia, unspecified organism: Secondary | ICD-10-CM | POA: Diagnosis present

## 2016-11-03 DIAGNOSIS — A0472 Enterocolitis due to Clostridium difficile, not specified as recurrent: Secondary | ICD-10-CM

## 2016-11-03 DIAGNOSIS — D539 Nutritional anemia, unspecified: Secondary | ICD-10-CM | POA: Diagnosis present

## 2016-11-03 DIAGNOSIS — K224 Dyskinesia of esophagus: Secondary | ICD-10-CM | POA: Diagnosis present

## 2016-11-03 DIAGNOSIS — A09 Infectious gastroenteritis and colitis, unspecified: Secondary | ICD-10-CM | POA: Diagnosis not present

## 2016-11-03 DIAGNOSIS — J690 Pneumonitis due to inhalation of food and vomit: Secondary | ICD-10-CM | POA: Diagnosis present

## 2016-11-03 DIAGNOSIS — Z888 Allergy status to other drugs, medicaments and biological substances status: Secondary | ICD-10-CM

## 2016-11-03 DIAGNOSIS — R197 Diarrhea, unspecified: Secondary | ICD-10-CM

## 2016-11-03 DIAGNOSIS — I255 Ischemic cardiomyopathy: Secondary | ICD-10-CM | POA: Diagnosis not present

## 2016-11-03 DIAGNOSIS — J9601 Acute respiratory failure with hypoxia: Secondary | ICD-10-CM | POA: Diagnosis present

## 2016-11-03 DIAGNOSIS — N179 Acute kidney failure, unspecified: Secondary | ICD-10-CM | POA: Diagnosis present

## 2016-11-03 DIAGNOSIS — I248 Other forms of acute ischemic heart disease: Secondary | ICD-10-CM | POA: Diagnosis present

## 2016-11-03 DIAGNOSIS — J449 Chronic obstructive pulmonary disease, unspecified: Secondary | ICD-10-CM | POA: Diagnosis not present

## 2016-11-03 DIAGNOSIS — R0602 Shortness of breath: Secondary | ICD-10-CM

## 2016-11-03 DIAGNOSIS — I700 Atherosclerosis of aorta: Secondary | ICD-10-CM | POA: Diagnosis present

## 2016-11-03 DIAGNOSIS — N182 Chronic kidney disease, stage 2 (mild): Secondary | ICD-10-CM | POA: Diagnosis present

## 2016-11-03 DIAGNOSIS — R1310 Dysphagia, unspecified: Secondary | ICD-10-CM

## 2016-11-03 DIAGNOSIS — Z85038 Personal history of other malignant neoplasm of large intestine: Secondary | ICD-10-CM

## 2016-11-03 DIAGNOSIS — R6520 Severe sepsis without septic shock: Secondary | ICD-10-CM | POA: Diagnosis present

## 2016-11-03 DIAGNOSIS — E875 Hyperkalemia: Secondary | ICD-10-CM | POA: Diagnosis present

## 2016-11-03 DIAGNOSIS — R079 Chest pain, unspecified: Secondary | ICD-10-CM | POA: Diagnosis not present

## 2016-11-03 DIAGNOSIS — Z7982 Long term (current) use of aspirin: Secondary | ICD-10-CM

## 2016-11-03 DIAGNOSIS — R7989 Other specified abnormal findings of blood chemistry: Secondary | ICD-10-CM

## 2016-11-03 DIAGNOSIS — Z7951 Long term (current) use of inhaled steroids: Secondary | ICD-10-CM

## 2016-11-03 DIAGNOSIS — I5021 Acute systolic (congestive) heart failure: Secondary | ICD-10-CM | POA: Diagnosis present

## 2016-11-03 DIAGNOSIS — F1010 Alcohol abuse, uncomplicated: Secondary | ICD-10-CM | POA: Diagnosis present

## 2016-11-03 DIAGNOSIS — Z79899 Other long term (current) drug therapy: Secondary | ICD-10-CM

## 2016-11-03 DIAGNOSIS — I428 Other cardiomyopathies: Secondary | ICD-10-CM | POA: Diagnosis not present

## 2016-11-03 DIAGNOSIS — E860 Dehydration: Secondary | ICD-10-CM | POA: Diagnosis present

## 2016-11-03 HISTORY — DX: Personal history of nicotine dependence: Z87.891

## 2016-11-03 LAB — BASIC METABOLIC PANEL
Anion gap: 12 (ref 5–15)
BUN: 40 mg/dL — AB (ref 6–20)
CALCIUM: 10.2 mg/dL (ref 8.9–10.3)
CO2: 26 mmol/L (ref 22–32)
CREATININE: 1.73 mg/dL — AB (ref 0.61–1.24)
Chloride: 95 mmol/L — ABNORMAL LOW (ref 101–111)
GFR, EST AFRICAN AMERICAN: 41 mL/min — AB (ref >60)
GFR, EST NON AFRICAN AMERICAN: 36 mL/min — AB (ref >60)
Glucose, Bld: 115 mg/dL — ABNORMAL HIGH (ref 65–99)
Potassium: 5.4 mmol/L — ABNORMAL HIGH (ref 3.5–5.1)
SODIUM: 133 mmol/L — AB (ref 135–145)

## 2016-11-03 LAB — URINALYSIS, ROUTINE W REFLEX MICROSCOPIC
GLUCOSE, UA: NEGATIVE mg/dL
Hgb urine dipstick: NEGATIVE
KETONES UR: 15 mg/dL — AB
LEUKOCYTES UA: NEGATIVE
NITRITE: NEGATIVE
PROTEIN: NEGATIVE mg/dL
Specific Gravity, Urine: 1.017 (ref 1.005–1.030)
pH: 5.5 (ref 5.0–8.0)

## 2016-11-03 LAB — I-STAT CG4 LACTIC ACID, ED
LACTIC ACID, VENOUS: 1.5 mmol/L (ref 0.5–1.9)
Lactic Acid, Venous: 1.02 mmol/L (ref 0.5–1.9)

## 2016-11-03 LAB — TROPONIN I

## 2016-11-03 LAB — CBG MONITORING, ED: GLUCOSE-CAPILLARY: 116 mg/dL — AB (ref 65–99)

## 2016-11-03 LAB — CBC
HCT: 33.4 % — ABNORMAL LOW (ref 39.0–52.0)
Hemoglobin: 11 g/dL — ABNORMAL LOW (ref 13.0–17.0)
MCH: 35.9 pg — ABNORMAL HIGH (ref 26.0–34.0)
MCHC: 32.9 g/dL (ref 30.0–36.0)
MCV: 109.2 fL — ABNORMAL HIGH (ref 78.0–100.0)
PLATELETS: 200 10*3/uL (ref 150–400)
RBC: 3.06 MIL/uL — ABNORMAL LOW (ref 4.22–5.81)
RDW: 17.3 % — AB (ref 11.5–15.5)
WBC: 14.8 10*3/uL — AB (ref 4.0–10.5)

## 2016-11-03 MED ORDER — ALBUTEROL SULFATE (2.5 MG/3ML) 0.083% IN NEBU
5.0000 mg | INHALATION_SOLUTION | Freq: Once | RESPIRATORY_TRACT | Status: AC
  Administered 2016-11-03: 5 mg via RESPIRATORY_TRACT
  Filled 2016-11-03: qty 6

## 2016-11-03 MED ORDER — SODIUM CHLORIDE 0.9 % IV BOLUS (SEPSIS)
500.0000 mL | Freq: Once | INTRAVENOUS | Status: AC
  Administered 2016-11-03: 1000 mL via INTRAVENOUS

## 2016-11-03 MED ORDER — DEXTROSE 5 % IV SOLN
1.0000 g | Freq: Once | INTRAVENOUS | Status: AC
  Administered 2016-11-03: 1 g via INTRAVENOUS
  Filled 2016-11-03: qty 10

## 2016-11-03 MED ORDER — SODIUM CHLORIDE 0.9 % IV BOLUS (SEPSIS)
500.0000 mL | Freq: Once | INTRAVENOUS | Status: AC
  Administered 2016-11-03: 500 mL via INTRAVENOUS

## 2016-11-03 MED ORDER — IOPAMIDOL (ISOVUE-370) INJECTION 76%
80.0000 mL | Freq: Once | INTRAVENOUS | Status: AC | PRN
  Administered 2016-11-03: 80 mL via INTRAVENOUS

## 2016-11-03 MED ORDER — SODIUM CHLORIDE 0.9 % IJ SOLN
INTRAMUSCULAR | Status: AC
  Administered 2016-11-03: 23:00:00
  Filled 2016-11-03: qty 50

## 2016-11-03 MED ORDER — IOPAMIDOL (ISOVUE-370) INJECTION 76%
INTRAVENOUS | Status: AC
  Administered 2016-11-03: 23:00:00
  Filled 2016-11-03: qty 100

## 2016-11-03 MED ORDER — DEXTROSE 5 % IV SOLN
500.0000 mg | Freq: Once | INTRAVENOUS | Status: AC
  Administered 2016-11-04: 500 mg via INTRAVENOUS
  Filled 2016-11-03: qty 500

## 2016-11-03 NOTE — ED Notes (Signed)
Returned from ct 

## 2016-11-03 NOTE — ED Provider Notes (Addendum)
[facility] DEPT Provider Note   CSN: [id number] Arrival date & time: 11/03/16  1745     History   Chief Complaint Chief Complaint  Patient presents with  . Shortness of Breath  . Weakness    HPI David Barton is a 82 y.o. male.  HPI Patient presents with fevers chills myalgias cough diarrhea fatigue with decreased appetite. Has been going for the last 2 weeks. Feels bad. Had a cough with a little bit of sputum. Patient denies history of COPD but is on Spiriva and states he is on a because he has COPD. Recently treated for urinary tract infection around 2 weeks ago. Family members did not know what antibiotic he was on. States it feels weak in his legs.   Past Medical History:  Diagnosis Date  . Alcohol abuse   . Colon cancer   . COPD (chronic obstructive pulmonary disease)   . Emphysema of lung   . Hypertension   . Peripheral edema     Patient Active Problem List   Diagnosis Date Noted  . Unspecified essential hypertension 05/31/2013  . COPD  GOLD II with reversibility 05/29/2013  . Hypokalemia 05/08/2013  . Hypotension 05/08/2013  . Dizziness 05/08/2013  . Hypoxia 05/08/2013  . Acute respiratory failure ([place]) 05/08/2013    Past Surgical History:  Procedure Laterality Date  . CATARACT EXTRACTION    . COLON RESECTION    . COLONOSCOPY    . MINOR HEMORRHOIDECTOMY  1995  . TONSILLECTOMY         Home Medications    Prior to Admission medications   Medication Sig Start Date End Date Taking? Authorizing Provider  aspirin EC 81 MG tablet Take 81 mg by mouth every morning.   Yes Historical Provider, MD  ferrous sulfate 325 (65 FE) MG tablet Take 325 mg by mouth daily with breakfast.   Yes Historical Provider, MD  lisinopril-hydrochlorothiazide (PRINZIDE,ZESTORETIC) 20-12.5 MG tablet Take 1 tablet by mouth daily. 10/29/16  Yes Historical Provider, MD  Multiple Vitamin (MULTIVITAMIN WITH MINERALS) TABS tablet Take 1 tablet by mouth daily.   Yes Historical  Provider, MD  sertraline (ZOLOFT) 50 MG tablet Take 50 mg by mouth daily.   Yes Historical Provider, MD  tiotropium (SPIRIVA) 18 MCG inhalation capsule Place 1 capsule (18 mcg total) into inhaler and inhale daily. 05/10/13  Yes Monika Salk, MD    Family History Family History  Problem Relation Age of Onset  . Colon cancer Mother     with mets to liver  . Lung cancer Brother     was a smoker    Social History Social History  Substance Use Topics  . Smoking status: Former Smoker    Packs/day: 1.00    Years: 20.00    Types: Cigarettes    Quit date: 12/20/1981  . Smokeless tobacco: Not on file  . Alcohol use 3.5 oz/week    7 drink(s) per week     Comment: one drink per day     Allergies   Flexeril [cyclobenzaprine] and Norvasc [amlodipine besylate]   Review of Systems Review of Systems  Constitutional: Positive for appetite change, fatigue and fever.  HENT: Negative for congestion.   Respiratory: Positive for cough.   Cardiovascular: Negative for chest pain.  Gastrointestinal: Positive for diarrhea.  Genitourinary: Positive for frequency. Negative for enuresis.  Musculoskeletal: Positive for back pain.  Skin: Negative for wound.  Neurological: Positive for weakness.  Hematological: Negative for adenopathy.     Physical Exam  Updated Vital Signs BP (!) 120/51   Pulse 116   Temp 98.6 F (37 C) (Oral)   Resp 22   Ht '5\' 10"'$  (1.778 m)   Wt 240 lb (108.9 kg)   SpO2 92%   BMI 34.44 kg/m   Physical Exam  Constitutional: He appears well-developed.  HENT:  Head: Atraumatic.  Neck: Neck supple.  Cardiovascular:  Mild tachycardia. Heart rate increases and hypoxia increases with just the effort to go from sitting on the bed rotating to lay on the bed.  Pulmonary/Chest:  Purse lip breathing. Mild wheezes.  Abdominal: There is no tenderness.  Musculoskeletal: Normal range of motion.  Neurological: He is alert.  Good flexion-extension both ankles. Apparent strength  intact in both knees.  Skin: Skin is warm.     ED Treatments / Results  Labs (all labs ordered are listed, but only abnormal results are displayed) Labs Reviewed  BASIC METABOLIC PANEL - Abnormal; Notable for the following:       Result Value   Sodium 133 (*)    Potassium 5.4 (*)    Chloride 95 (*)    Glucose, Bld 115 (*)    BUN 40 (*)    Creatinine, Ser 1.73 (*)    GFR calc non Af Amer 36 (*)    GFR calc Af Amer 41 (*)    All other components within normal limits  CBC - Abnormal; Notable for the following:    WBC 14.8 (*)    RBC 3.06 (*)    Hemoglobin 11.0 (*)    HCT 33.4 (*)    MCV 109.2 (*)    MCH 35.9 (*)    RDW 17.3 (*)    All other components within normal limits  URINALYSIS, ROUTINE W REFLEX MICROSCOPIC (NOT AT [facility]) - Abnormal; Notable for the following:    Bilirubin Urine LARGE (*)    Ketones, ur 15 (*)    All other components within normal limits  CBG MONITORING, ED - Abnormal; Notable for the following:    Glucose-Capillary 116 (*)    All other components within normal limits  CULTURE, BLOOD (ROUTINE X 2)  CULTURE, BLOOD (ROUTINE X 2)  URINE CULTURE  TROPONIN I  I-STAT CG4 LACTIC ACID, ED  I-STAT CG4 LACTIC ACID, ED    EKG  EKG Interpretation  Date/Time:  Wednesday November 03 2016 18:10:16 EST Ventricular Rate:  120 PR Interval:    QRS Duration: 95 QT Interval:  292 QTC Calculation: 413 R Axis:   47 Text Interpretation:  Sinus tachycardia Abnormal R-wave progression, early transition Baseline wander in lead(s) V4 Confirmed by Alvino Chapel  MD, Ovid Curd [phone]) on 11/03/2016 7:18:29 PM       Radiology Dg Chest 2 View  Result Date: 11/03/2016 CLINICAL DATA:  Shortness of breath, diarrhea, fatigue. History of COPD, colon cancer. EXAM: CHEST  2 VIEW COMPARISON:  Chest radiograph July 06, 2013 FINDINGS: Cardiomediastinal silhouette is upper limits of normal in size. Mildly calcified aorta. No pleural effusion or focal consolidation. No pneumothorax.  Strandy densities LEFT lung base. Small nodular density projecting RIGHT lung base. Old RIGHT anterior rib fractures. Severe RIGHT glenohumeral osteoarthrosis. Moderate degenerative change of thoracic spine. IMPRESSION: Borderline cardiomegaly.  LEFT lung base atelectasis. **An incidental finding of potential clinical significance has been found. Small nodular density RIGHT lung base. Recommend CT chest at 6-12 months.** Electronically Signed   By: Elon Alas M.D.   On: 11/03/2016 20:16   Ct Angio Chest Pe W And/or Wo Contrast  Result Date: 11/03/2016 CLINICAL DATA:  Shortness of breath, fatigue. History of colon cancer, emphysema and COPD. EXAM: CT ANGIOGRAPHY CHEST WITH CONTRAST TECHNIQUE: Multidetector CT imaging of the chest was performed using the standard protocol during bolus administration of intravenous contrast. Multiplanar CT image reconstructions and MIPs were obtained to evaluate the vascular anatomy. CONTRAST:  80 cc Isovue 370 COMPARISON:  Chest radiograph November 03, 2016 at 1957 hours and CT chest May 16, 2013 FINDINGS: CARDIOVASCULAR: Adequate contrast opacification of the pulmonary artery's. Main pulmonary artery is not enlarged. No pulmonary arterial filling defects to the level of the subsegmental branches. Heart size is normal, no right heart strain. Mild coronary artery calcifications. No pericardial effusions. Thoracic aorta is normal course and caliber, mild calcific atherosclerosis. MEDIASTINUM/NODES: No lymphadenopathy by CT size criteria. Small RIGHT hilar lymph nodes without lymphadenopathy by CT size criteria. LUNGS/PLEURA: Mild bronchial wall thickening. Minimal secretions in the trachea mucous plugging RIGHT lower lobe bronchi. Diffuse mild bronchial wall thickening. Hypo enhancing RIGHT lower lobe consolidation with air bronchograms. Moderate centrilobular emphysema. No pleural effusion. No pulmonary nodule or mass. LEFT lung base atelectasis. UPPER ABDOMEN: Included  view of the abdomen is nonacute. Adrenal glands not included. Hepatic steatosis. MUSCULOSKELETAL: Visualized soft tissues and included osseous structures are nonacute. Severe RIGHT glenohumeral osteoarthrosis with undersurface spurring. Old RIGHT antral lateral rib fractures. Review of the MIP images confirms the above findings. IMPRESSION: No acute pulmonary embolism. RIGHT lower lobe consolidation concerning for pneumonia, possibly secondary to aspiration. Diffuse mild bronchial wall thickening can be seen with reactive airway dose disease or bronchitis. Secretions in the trachea and bronchi consistent with aspiration. Moderate emphysema. No pulmonary nodule, radiographic findings could be artifact or, secondary to RIGHT lung base consolidation. Electronically Signed   By: Elon Alas M.D.   On: 11/03/2016 23:15    Procedures Procedures (including critical care time)  Medications Ordered in ED Medications  cefTRIAXone (ROCEPHIN) 1 g in dextrose 5 % 50 mL IVPB (not administered)  azithromycin (ZITHROMAX) 500 mg in dextrose 5 % 250 mL IVPB (not administered)  albuterol (PROVENTIL) (2.5 MG/3ML) 0.083% nebulizer solution 5 mg (5 mg Nebulization Given 11/03/16 1928)  sodium chloride 0.9 % bolus 500 mL (0 mLs Intravenous Stopped 11/03/16 2114)  sodium chloride 0.9 % bolus 500 mL (500 mLs Intravenous New Bag/Given 11/03/16 2225)  iopamidol (ISOVUE-370) 76 % injection 80 mL (80 mLs Intravenous Contrast Given 11/03/16 2239)  sodium chloride 0.9 % injection (  Given by Other 11/03/16 2305)  iopamidol (ISOVUE-370) 76 % injection (  Contrast Given 11/03/16 2305)     Initial Impression / Assessment and Plan / ED Course  I have reviewed the triage vital signs and the nursing notes.  Pertinent labs & imaging results that were available during my care of the patient were reviewed by me and considered in my medical decision making (see chart for details).  Clinical Course     Patient presents with  fever. Has had some loose stools. Has had diarrhea for 2 weeks. States he's been feeling bad. Has had a little bit of cough. Had hypoxia. CT scan done due to hypoxia with overall benign lung findings. Also found to have elevated creatinine. Likely has a some dehydration since he's been having diarrhea and not eating as much. Admit to internal medicine.   Final Clinical Impressions(s) / ED Diagnoses   Final diagnoses:  Community acquired pneumonia of right lung, unspecified part of lung  Renal insufficiency    New Prescriptions New Prescriptions  No medications on file     Davonna Belling, MD 11/03/16 [place], MD 11/03/16 [phone]

## 2016-11-03 NOTE — ED Notes (Signed)
Assisted pt to bedside commode. Pt had another liquid stool. Pt reports he has had episodes of diarrhea for about 2 weeks.

## 2016-11-03 NOTE — ED Notes (Signed)
ED Provider at bedside. 

## 2016-11-03 NOTE — ED Triage Notes (Addendum)
Pt reports sob, diarrhea, fatigue, and loss of appetite. Pt denies hx of COPD or Asthma. Pt denies cp. Pt A+OX4, speaking in complete sentences.

## 2016-11-03 NOTE — ED Notes (Signed)
Patient transported to CT 

## 2016-11-04 ENCOUNTER — Encounter (HOSPITAL_COMMUNITY): Payer: Self-pay | Admitting: *Deleted

## 2016-11-04 DIAGNOSIS — R197 Diarrhea, unspecified: Secondary | ICD-10-CM

## 2016-11-04 DIAGNOSIS — J181 Lobar pneumonia, unspecified organism: Secondary | ICD-10-CM

## 2016-11-04 DIAGNOSIS — E875 Hyperkalemia: Secondary | ICD-10-CM

## 2016-11-04 DIAGNOSIS — J690 Pneumonitis due to inhalation of food and vomit: Secondary | ICD-10-CM | POA: Diagnosis present

## 2016-11-04 DIAGNOSIS — N179 Acute kidney failure, unspecified: Secondary | ICD-10-CM

## 2016-11-04 LAB — CBC
HCT: 31.1 % — ABNORMAL LOW (ref 39.0–52.0)
HEMOGLOBIN: 10.2 g/dL — AB (ref 13.0–17.0)
MCH: 35.9 pg — AB (ref 26.0–34.0)
MCHC: 32.8 g/dL (ref 30.0–36.0)
MCV: 109.5 fL — AB (ref 78.0–100.0)
PLATELETS: 188 10*3/uL (ref 150–400)
RBC: 2.84 MIL/uL — AB (ref 4.22–5.81)
RDW: 17.7 % — ABNORMAL HIGH (ref 11.5–15.5)
WBC: 13.3 10*3/uL — ABNORMAL HIGH (ref 4.0–10.5)

## 2016-11-04 LAB — C DIFFICILE QUICK SCREEN W PCR REFLEX
C DIFFICILE (CDIFF) INTERP: DETECTED
C DIFFICILE (CDIFF) TOXIN: POSITIVE — AB
C Diff antigen: POSITIVE — AB

## 2016-11-04 LAB — BASIC METABOLIC PANEL
ANION GAP: 10 (ref 5–15)
BUN: 41 mg/dL — ABNORMAL HIGH (ref 6–20)
CALCIUM: 9.6 mg/dL (ref 8.9–10.3)
CO2: 25 mmol/L (ref 22–32)
Chloride: 98 mmol/L — ABNORMAL LOW (ref 101–111)
Creatinine, Ser: 1.71 mg/dL — ABNORMAL HIGH (ref 0.61–1.24)
GFR calc Af Amer: 42 mL/min — ABNORMAL LOW (ref >60)
GFR calc non Af Amer: 36 mL/min — ABNORMAL LOW (ref >60)
GLUCOSE: 179 mg/dL — AB (ref 65–99)
Potassium: 4.3 mmol/L (ref 3.5–5.1)
Sodium: 133 mmol/L — ABNORMAL LOW (ref 135–145)

## 2016-11-04 LAB — EXPECTORATED SPUTUM ASSESSMENT W REFEX TO RESP CULTURE

## 2016-11-04 LAB — MRSA PCR SCREENING: MRSA by PCR: NEGATIVE

## 2016-11-04 LAB — STREP PNEUMONIAE URINARY ANTIGEN: Strep Pneumo Urinary Antigen: NEGATIVE

## 2016-11-04 LAB — EXPECTORATED SPUTUM ASSESSMENT W GRAM STAIN, RFLX TO RESP C

## 2016-11-04 MED ORDER — SODIUM CHLORIDE 0.9 % IV SOLN
INTRAVENOUS | Status: DC
  Administered 2016-11-04 – 2016-11-07 (×5): via INTRAVENOUS

## 2016-11-04 MED ORDER — TIOTROPIUM BROMIDE MONOHYDRATE 18 MCG IN CAPS
18.0000 ug | ORAL_CAPSULE | Freq: Every day | RESPIRATORY_TRACT | Status: DC
  Filled 2016-11-04: qty 5

## 2016-11-04 MED ORDER — VANCOMYCIN 50 MG/ML ORAL SOLUTION
500.0000 mg | Freq: Four times a day (QID) | ORAL | Status: DC
  Administered 2016-11-04 – 2016-11-20 (×66): 500 mg via ORAL
  Filled 2016-11-04 (×67): qty 10

## 2016-11-04 MED ORDER — TRAMADOL HCL 50 MG PO TABS: 50.0000 mg | ORAL_TABLET | Freq: Four times a day (QID) | ORAL | Status: DC | PRN

## 2016-11-04 MED ORDER — ONDANSETRON HCL 4 MG/2ML IJ SOLN
4.0000 mg | Freq: Four times a day (QID) | INTRAMUSCULAR | Status: DC | PRN
Start: 2016-11-04 — End: 2016-11-20

## 2016-11-04 MED ORDER — ALBUTEROL SULFATE (2.5 MG/3ML) 0.083% IN NEBU
2.5000 mg | INHALATION_SOLUTION | Freq: Three times a day (TID) | RESPIRATORY_TRACT | Status: DC
  Administered 2016-11-04 – 2016-11-07 (×9): 2.5 mg via RESPIRATORY_TRACT
  Filled 2016-11-04 (×10): qty 3

## 2016-11-04 MED ORDER — METRONIDAZOLE IN NACL 5-0.79 MG/ML-% IV SOLN
500.0000 mg | Freq: Three times a day (TID) | INTRAVENOUS | Status: DC
  Administered 2016-11-04 (×2): 500 mg via INTRAVENOUS
  Filled 2016-11-04 (×2): qty 100

## 2016-11-04 MED ORDER — FLORANEX PO PACK
1.0000 g | PACK | Freq: Three times a day (TID) | ORAL | Status: DC
  Administered 2016-11-04 – 2016-11-09 (×14): 1 g via ORAL
  Filled 2016-11-04 (×18): qty 1

## 2016-11-04 MED ORDER — ASPIRIN EC 81 MG PO TBEC
81.0000 mg | DELAYED_RELEASE_TABLET | Freq: Every morning | ORAL | Status: DC
  Administered 2016-11-04 – 2016-11-20 (×17): 81 mg via ORAL
  Filled 2016-11-04 (×17): qty 1

## 2016-11-04 MED ORDER — FERROUS SULFATE 325 (65 FE) MG PO TABS
325.0000 mg | ORAL_TABLET | Freq: Every day | ORAL | Status: DC
  Administered 2016-11-04 – 2016-11-20 (×17): 325 mg via ORAL
  Filled 2016-11-04 (×17): qty 1

## 2016-11-04 MED ORDER — ACETAMINOPHEN 650 MG RE SUPP: 650.0000 mg | Freq: Four times a day (QID) | RECTAL | Status: DC | PRN

## 2016-11-04 MED ORDER — ONDANSETRON HCL 4 MG PO TABS: 4.0000 mg | ORAL_TABLET | Freq: Four times a day (QID) | ORAL | Status: DC | PRN

## 2016-11-04 MED ORDER — ENOXAPARIN SODIUM 40 MG/0.4ML ~~LOC~~ SOLN
40.0000 mg | Freq: Every day | SUBCUTANEOUS | Status: DC
  Administered 2016-11-04 – 2016-11-20 (×17): 40 mg via SUBCUTANEOUS
  Filled 2016-11-04 (×17): qty 0.4

## 2016-11-04 MED ORDER — ORAL CARE MOUTH RINSE
15.0000 mL | Freq: Two times a day (BID) | OROMUCOSAL | Status: DC
  Administered 2016-11-05 – 2016-11-20 (×23): 15 mL via OROMUCOSAL

## 2016-11-04 MED ORDER — TIOTROPIUM BROMIDE MONOHYDRATE 18 MCG IN CAPS
18.0000 ug | ORAL_CAPSULE | Freq: Every day | RESPIRATORY_TRACT | Status: DC
  Administered 2016-11-04 – 2016-11-20 (×15): 18 ug via RESPIRATORY_TRACT
  Filled 2016-11-04 (×4): qty 5

## 2016-11-04 MED ORDER — GUAIFENESIN ER 600 MG PO TB12
600.0000 mg | ORAL_TABLET | Freq: Two times a day (BID) | ORAL | Status: DC
  Administered 2016-11-04 – 2016-11-20 (×33): 600 mg via ORAL
  Filled 2016-11-04 (×33): qty 1

## 2016-11-04 MED ORDER — CEFTRIAXONE SODIUM 1 G IJ SOLR
1.0000 g | INTRAMUSCULAR | Status: AC
  Administered 2016-11-04 – 2016-11-10 (×7): 1 g via INTRAVENOUS
  Filled 2016-11-04 (×7): qty 10

## 2016-11-04 MED ORDER — AZITHROMYCIN 500 MG IV SOLR: 500.0000 mg | INTRAVENOUS | Status: DC

## 2016-11-04 MED ORDER — AZITHROMYCIN 250 MG PO TABS
500.0000 mg | ORAL_TABLET | Freq: Every day | ORAL | Status: AC
  Administered 2016-11-04 – 2016-11-09 (×6): 500 mg via ORAL
  Filled 2016-11-04 (×6): qty 2

## 2016-11-04 MED ORDER — SERTRALINE HCL 50 MG PO TABS
50.0000 mg | ORAL_TABLET | Freq: Every day | ORAL | Status: DC
Start: 2016-11-04 — End: 2016-11-20
  Administered 2016-11-04 – 2016-11-20 (×17): 50 mg via ORAL
  Filled 2016-11-04 (×17): qty 1

## 2016-11-04 MED ORDER — ALBUTEROL SULFATE (2.5 MG/3ML) 0.083% IN NEBU: 2.5000 mg | INHALATION_SOLUTION | Freq: Four times a day (QID) | RESPIRATORY_TRACT | Status: DC | PRN

## 2016-11-04 MED ORDER — ACETAMINOPHEN 325 MG PO TABS
650.0000 mg | ORAL_TABLET | Freq: Four times a day (QID) | ORAL | Status: DC | PRN
  Administered 2016-11-04: 650 mg via ORAL
  Filled 2016-11-04: qty 2

## 2016-11-04 MED ORDER — METRONIDAZOLE 500 MG PO TABS
500.0000 mg | ORAL_TABLET | Freq: Three times a day (TID) | ORAL | Status: DC
  Administered 2016-11-04 – 2016-11-11 (×21): 500 mg via ORAL
  Filled 2016-11-04 (×21): qty 1

## 2016-11-04 MED ORDER — ADULT MULTIVITAMIN W/MINERALS CH
1.0000 | ORAL_TABLET | Freq: Every day | ORAL | Status: DC
  Administered 2016-11-04 – 2016-11-20 (×17): 1 via ORAL
  Filled 2016-11-04 (×17): qty 1

## 2016-11-04 NOTE — H&P (Signed)
History and Physical    David Barton CNO:[id number] DOB: Mar 23, 1933 DOA: 11/03/2016  PCP: Gennette Pac, MD  Patient coming from: Home   Chief Complaint: SOB, weakness, generalized.   HPI: David Barton is a 82 y.o. male with medical history significant of Colon cancer,COPD, HTN who presents complaining of generalized weakness, unable to ambulate. Feels his legs are weak. He report mild cough, sometimes after eating. He is also having diarrhea, he is not able to controlled it. He has had 2 watery stool in the ED. Multiples Bowel movements at home since 4 days prior to admission. He was treated for UTI by PCP recently/    ED Course: Sodium 133, k at 5.4, cr 1.7, troponin 0.03, lactic acid 1.5, CT angio: RIGHT lower lobe consolidation concerning for pneumonia, possibly secondary to aspiration. Diffuse mild bronchial wall thickening can be seen with reactive airway dose disease or bronchitis. Secretions in the trachea and bronchi consistent with aspiration.  Review of Systems: As per HPI otherwise 10 point review of systems negative.    Past Medical History:  Diagnosis Date  . Alcohol abuse   . Colon cancer   . COPD (chronic obstructive pulmonary disease)   . Emphysema of lung   . Hypertension   . Peripheral edema     Past Surgical History:  Procedure Laterality Date  . CATARACT EXTRACTION    . COLON RESECTION    . COLONOSCOPY    . MINOR HEMORRHOIDECTOMY  1995  . TONSILLECTOMY       reports that he quit smoking about 34 years ago. His smoking use included Cigarettes. He has a 20.00 pack-year smoking history. He does not have any smokeless tobacco history on file. He reports that he drinks about 3.5 oz of alcohol per week . He reports that he does not use drugs.  Allergies  Allergen Reactions  . Flexeril [Cyclobenzaprine] Other (See Comments)    Unknown.   . Norvasc [Amlodipine Besylate] Other (See Comments)    Unknown.     Family History  Problem Relation Age of  Onset  . Colon cancer Mother     with mets to liver  . Lung cancer Brother     was a smoker     Prior to Admission medications   Medication Sig Start Date End Date Taking? Authorizing Provider  aspirin EC 81 MG tablet Take 81 mg by mouth every morning.   Yes Historical Provider, MD  ferrous sulfate 325 (65 FE) MG tablet Take 325 mg by mouth daily with breakfast.   Yes Historical Provider, MD  lisinopril-hydrochlorothiazide (PRINZIDE,ZESTORETIC) 20-12.5 MG tablet Take 1 tablet by mouth daily. 10/29/16  Yes Historical Provider, MD  Multiple Vitamin (MULTIVITAMIN WITH MINERALS) TABS tablet Take 1 tablet by mouth daily.   Yes Historical Provider, MD  sertraline (ZOLOFT) 50 MG tablet Take 50 mg by mouth daily.   Yes Historical Provider, MD  tiotropium (SPIRIVA) 18 MCG inhalation capsule Place 1 capsule (18 mcg total) into inhaler and inhale daily. 05/10/13  Yes Monika Salk, MD    Physical Exam: Vitals:   11/03/16 2145 11/03/16 2200 11/03/16 2230 11/04/16 0000  BP: (!) 114/51 (!) 120/51  125/56  Pulse: 117 116 116 118  Resp: '24 22 22 23  '$ Temp:      TempSrc:      SpO2: 91% 91% 92% 90%  Weight:      Height:          Constitutional: NAD, calm, comfortable Vitals:  11/03/16 2145 11/03/16 2200 11/03/16 2230 11/04/16 0000  BP: (!) 114/51 (!) 120/51  125/56  Pulse: 117 116 116 118  Resp: '24 22 22 23  '$ Temp:      TempSrc:      SpO2: 91% 91% 92% 90%  Weight:      Height:       Eyes: PERRL, lids and conjunctivae normal ENMT: Mucous membranes are dry t. Posterior pharynx clear of any exudate or lesions.Normal dentition.  Neck: normal, supple, no masses, no thyromegaly Respiratory: clear to auscultation bilaterally, no wheezing, no crackles. Normal respiratory effort. No accessory muscle use.  Cardiovascular: Regular rate and rhythm, no murmurs / rubs / gallops. No extremity edema. 2+ pedal pulses. No carotid bruits.  Abdomen: no tenderness, no masses palpated. No hepatosplenomegaly.  Bowel sounds positive.  Musculoskeletal: no clubbing / cyanosis. No joint deformity upper and lower extremities. Good ROM, no contractures. Normal muscle tone.  Skin: no rashes, lesions, ulcers. No induration Neurologic: CN 2-12 grossly intact. Sensation intact, DTR normal. Strength 5/5 in all 4.  Psychiatric: Normal judgment and insight. Alert and oriented x 3. Normal mood.     Labs on Admission: I have personally reviewed following labs and imaging studies  CBC:  Recent Labs Lab 11/03/16 1846  WBC 14.8*  HGB 11.0*  HCT 33.4*  MCV 109.2*  PLT 354   Basic Metabolic Panel:  Recent Labs Lab 11/03/16 1846  NA 133*  K 5.4*  CL 95*  CO2 26  GLUCOSE 115*  BUN 40*  CREATININE 1.73*  CALCIUM 10.2   GFR: Estimated Creatinine Clearance: 42.1 mL/min (by C-G formula based on SCr of 1.73 mg/dL (H)). Liver Function Tests: No results for input(s): AST, ALT, ALKPHOS, BILITOT, PROT, ALBUMIN in the last 168 hours. No results for input(s): LIPASE, AMYLASE in the last 168 hours. No results for input(s): AMMONIA in the last 168 hours. Coagulation Profile: No results for input(s): INR, PROTIME in the last 168 hours. Cardiac Enzymes:  Recent Labs Lab 11/03/16 1846  TROPONINI <0.03   BNP (last 3 results) No results for input(s): PROBNP in the last 8760 hours. HbA1C: No results for input(s): HGBA1C in the last 72 hours. CBG:  Recent Labs Lab 11/03/16 1836  GLUCAP 116*   Lipid Profile: No results for input(s): CHOL, HDL, LDLCALC, TRIG, CHOLHDL, LDLDIRECT in the last 72 hours. Thyroid Function Tests: No results for input(s): TSH, T4TOTAL, FREET4, T3FREE, THYROIDAB in the last 72 hours. Anemia Panel: No results for input(s): VITAMINB12, FOLATE, FERRITIN, TIBC, IRON, RETICCTPCT in the last 72 hours. Urine analysis:    Component Value Date/Time   COLORURINE YELLOW 11/03/2016 1800   APPEARANCEUR CLEAR 11/03/2016 1800   LABSPEC 1.017 11/03/2016 1800   PHURINE 5.5 11/03/2016  1800   GLUCOSEU NEGATIVE 11/03/2016 1800   HGBUR NEGATIVE 11/03/2016 1800   BILIRUBINUR LARGE (A) 11/03/2016 1800   KETONESUR 15 (A) 11/03/2016 1800   PROTEINUR NEGATIVE 11/03/2016 1800   UROBILINOGEN 1.0 05/08/2013 1738   NITRITE NEGATIVE 11/03/2016 1800   LEUKOCYTESUR NEGATIVE 11/03/2016 1800   Sepsis Labs: !!!!!!!!!!!!!!!!!!!!!!!!!!!!!!!!!!!!!!!!!!!! '@LABRCNTIP'$ (procalcitonin:4,lacticidven:4) ) Recent Results (from the past 240 hour(s))  Culture, blood (Routine x 2)     Status: None (Preliminary result)   Collection Time: 11/03/16  6:49 PM  Result Value Ref Range Status   Specimen Description   Final    BLOOD LEFT ANTECUBITAL Performed at [facility]  Final   Culture PENDING  Incomplete   Report Status PENDING  Incomplete  Culture, blood (Routine x 2)     Status: None (Preliminary result)   Collection Time: 11/03/16  7:20 PM  Result Value Ref Range Status   Specimen Description   Final    BLOOD LEFT FOREARM Performed at [facility]    Special Requests BOTTLES DRAWN AEROBIC AND ANAEROBIC 5ML  Final   Culture PENDING  Incomplete   Report Status PENDING  Incomplete     Radiological Exams on Admission: Dg Chest 2 View  Result Date: 11/03/2016 CLINICAL DATA:  Shortness of breath, diarrhea, fatigue. History of COPD, colon cancer. EXAM: CHEST  2 VIEW COMPARISON:  Chest radiograph July 06, 2013 FINDINGS: Cardiomediastinal silhouette is upper limits of normal in size. Mildly calcified aorta. No pleural effusion or focal consolidation. No pneumothorax. Strandy densities LEFT lung base. Small nodular density projecting RIGHT lung base. Old RIGHT anterior rib fractures. Severe RIGHT glenohumeral osteoarthrosis. Moderate degenerative change of thoracic spine. IMPRESSION: Borderline cardiomegaly.  LEFT lung base atelectasis. **An incidental finding of potential clinical significance has been found. Small nodular  density RIGHT lung base. Recommend CT chest at 6-12 months.** Electronically Signed   By: Elon Alas M.D.   On: 11/03/2016 20:16   Ct Angio Chest Pe W And/or Wo Contrast  Result Date: 11/03/2016 CLINICAL DATA:  Shortness of breath, fatigue. History of colon cancer, emphysema and COPD. EXAM: CT ANGIOGRAPHY CHEST WITH CONTRAST TECHNIQUE: Multidetector CT imaging of the chest was performed using the standard protocol during bolus administration of intravenous contrast. Multiplanar CT image reconstructions and MIPs were obtained to evaluate the vascular anatomy. CONTRAST:  80 cc Isovue 370 COMPARISON:  Chest radiograph November 03, 2016 at 1957 hours and CT chest May 16, 2013 FINDINGS: CARDIOVASCULAR: Adequate contrast opacification of the pulmonary artery's. Main pulmonary artery is not enlarged. No pulmonary arterial filling defects to the level of the subsegmental branches. Heart size is normal, no right heart strain. Mild coronary artery calcifications. No pericardial effusions. Thoracic aorta is normal course and caliber, mild calcific atherosclerosis. MEDIASTINUM/NODES: No lymphadenopathy by CT size criteria. Small RIGHT hilar lymph nodes without lymphadenopathy by CT size criteria. LUNGS/PLEURA: Mild bronchial wall thickening. Minimal secretions in the trachea mucous plugging RIGHT lower lobe bronchi. Diffuse mild bronchial wall thickening. Hypo enhancing RIGHT lower lobe consolidation with air bronchograms. Moderate centrilobular emphysema. No pleural effusion. No pulmonary nodule or mass. LEFT lung base atelectasis. UPPER ABDOMEN: Included view of the abdomen is nonacute. Adrenal glands not included. Hepatic steatosis. MUSCULOSKELETAL: Visualized soft tissues and included osseous structures are nonacute. Severe RIGHT glenohumeral osteoarthrosis with undersurface spurring. Old RIGHT antral lateral rib fractures. Review of the MIP images confirms the above findings. IMPRESSION: No acute pulmonary  embolism. RIGHT lower lobe consolidation concerning for pneumonia, possibly secondary to aspiration. Diffuse mild bronchial wall thickening can be seen with reactive airway dose disease or bronchitis. Secretions in the trachea and bronchi consistent with aspiration. Moderate emphysema. No pulmonary nodule, radiographic findings could be artifact or, secondary to RIGHT lung base consolidation. Electronically Signed   By: Elon Alas M.D.   On: 11/03/2016 23:15    EKG: Independently reviewed. Sinus tachycardia HR 120   Assessment/Plan Active Problems:   COPD  GOLD II with reversibility   Essential hypertension   CAP (community acquired pneumonia)   PNA (pneumonia)   Diarrhea   AKI (acute kidney injury) ([place])   Hyperkalemia  1-Right lower lobe PNA, community acquired vs aspiration.  Patient presents with mild  fever,SOB.CTangio showed PNA.  Sputum culture, legionella and strep antigen ordered.  Continue with IV ceftriaxone and Azithromycin. Add flagyl to cover for aspiration.  Speech swallow ordered.   2-AKI;  Suspect related to hypovolemia secondary to diarrhea.  Has received 1.5LintheED>  Continue with IV fluids.  Hold Lisinopril ,HCTZ.   3-Diarrhea; was recently on antibiotics.presents with profuse watery diarrhea,mild fever.  Check for C diff.  Start empiric flagyl.   4-Acute hypoxic respiratory failure; in setting of PNA. Has history of COPD.  Continue with spiriva.  Start albuterol PRN.  5-Hyperkalemia; patient with diarrhea,suspect potasium will decrease. Will repeat labs tonight. .   6-Sepsis, presents with Tachycardia; fever,RR more than 20,leukocytosis. Source of infection PNA>  IV fluids.IVantibiotics.  Lactic acid normal.   Suspect related to fever,dehydration.  IV fluids.   DVT prophylaxis: lovenox.  Code Status: wishes to be DNR  Family Communication:none at bed side.  Disposition Plan: to be determine, will need PT.might need rehab.  Consults called:  none  Admission status: inpatient, step down.    Elmarie Shiley MD [facility] Pager [phone]  If 7PM-7AM, please contact night-coverage [email or web address] Password [email or web address]  11/04/2016, 12:38 AM

## 2016-11-04 NOTE — Care Management Note (Signed)
Case Management Note  Patient Details  Name: David Barton MRN: [id number] Date of Birth: 03-02-1933  Subjective/Objective:        Sepsis and positive for c-diff            Action/Plan: home when stable lives alone with family support. Date:  November 04, 2016 Chart reviewed for concurrent status and case management needs. Will continue to follow patient progress. Discharge Planning: following for needs Expected discharge date: [id number] Rhonda Davis, BSN, [place], [place]   Expected Discharge Date:                  Expected Discharge Plan:  Home/Self Care  In-House Referral:     Discharge planning Services     Post Acute Care Choice:    Choice offered to:     DME Arranged:    DME Agency:     HH Arranged:    [place] Agency:     Status of Service:  In process, will continue to follow  If discussed at Long Length of Stay Meetings, dates discussed:    Additional Comments:  Leeroy Cha, RN 11/04/2016, 10:26 AM

## 2016-11-04 NOTE — Evaluation (Signed)
Clinical/Bedside Swallow Evaluation Patient Details  Name: David Barton MRN: [id number] Date of Birth: 1933-07-07  Today's Date: 11/04/2016 Time: SLP Start Time (ACUTE ONLY): 1716 SLP Stop Time (ACUTE ONLY): 1730 SLP Time Calculation (min) (ACUTE ONLY): 14 min  Past Medical History:  Past Medical History:  Diagnosis Date  . Alcohol abuse   . Colon cancer ([place])   . COPD (chronic obstructive pulmonary disease) ([place])   . Emphysema of lung ([place])   . Hypertension   . Peripheral edema    Past Surgical History:  Past Surgical History:  Procedure Laterality Date  . CATARACT EXTRACTION    . COLON RESECTION    . COLONOSCOPY    . MINOR HEMORRHOIDECTOMY  1995  . TONSILLECTOMY     HPI:  82 yo male adm to [facility] for CAP.  PMH + for COPD, ETOH, HTN, diarrhea, Cdif, recent UTI and progressive weakness.   Swallow evaluation ordered.  Pt reports nausea has been progressive over the last few weeks contributing to pt not wanting to eat.  In addition, pt reports gagging on foods (breads) but tolerance of pasta or liquids.  CT chest concerning for aspiration pneumonias.     Assessment / Plan / Recommendation Clinical Impression  Pt presents with functional oropharyngeal swallow ability.  CN exam unremarkable -  Pt challenged with 3 ounce water test = sequentially drinking without difficulties.  No indication of aspiration with po observed.  Pt denies gagging with brushing his teeth, consuming liquids and soft foods.  He reports only gagging with sandwiches/solids = causing him to be unable to transit into pharynx.  This does not seem like a neurological based dysphagia.     Pt also denies issues with esophageal clearance nor refluxing.      Aspiration Risk  Mild aspiration risk    Diet Recommendation Thin liquid   Liquid Administration via: Cup;Straw Supervision: Patient able to self feed Compensations: Slow rate;Small sips/bites Postural Changes: Seated upright at 90 degrees    Other   Recommendations Oral Care Recommendations: Oral care BID   Follow up Recommendations None      Frequency and Duration            Prognosis        Swallow Study   General Date of Onset: 11/04/16 HPI: 82 yo male adm to [facility] for CAP.  PMH + for COPD, ETOH, HTN, diarrhea, Cdif, recent UTI and progressive weakness.   Swallow evaluation ordered.  Pt reports nausea has been progressive over the last few weeks contributing to pt not wanting to eat.  In addition, pt reports gagging on foods (breads) but tolerance of pasta or liquids.  CT chest concerning for aspiration pneumonias.   Type of Study: Bedside Swallow Evaluation Previous Swallow Assessment: none - per chart pt to have an esophagram  Diet Prior to this Study: Thin liquids (full liquids) Temperature Spikes Noted: Yes Respiratory Status: Nasal cannula (5 liters) History of Recent Intubation: No Behavior/Cognition: Alert;Cooperative Oral Care Completed by SLP: No Oral Cavity - Dentition: Adequate natural dentition Vision: Functional for self-feeding Self-Feeding Abilities: Able to feed self Patient Positioning: Upright in bed Baseline Vocal Quality: Low vocal intensity Volitional Cough: Weak Volitional Swallow: Able to elicit    Oral/Motor/Sensory Function Overall Oral Motor/Sensory Function: Within functional limits   Ice Chips Ice chips: Not tested   Thin Liquid Thin Liquid: Within functional limits Presentation: Straw;Cup;Self Fed    Nectar Thick Nectar Thick Liquid: Not tested   Honey Thick Honey  Thick Liquid: Not tested   Puree Puree: Not tested   Solid   GO   Solid: Not tested Other Comments: pt on full liquids -         Luanna Salk, [facility] SLP [phone]

## 2016-11-04 NOTE — Progress Notes (Signed)
PROGRESS NOTE  David Barton YQM:[id number] DOB: 03-10-1933 DOA: 11/03/2016 PCP: Gennette Pac, MD  HPI/Recap of past 24 hours:  Patient seen with wife and RN in room Report he remain febrile, still has loose stool, denies abdominal pain He remain oxygen dependent, he report has been coughing for the last few months, with trouble swallow solid He report progressive bilateral lower extremity weakness, and difficulty ambulating He has chronic lower extremity edema for which he is on lasix, his edema has improved recently  Assessment/Plan: Active Problems:   COPD  GOLD II with reversibility   Essential hypertension   CAP (community acquired pneumonia)   PNA (pneumonia)   Diarrhea   AKI (acute kidney injury) ([place])   Hyperkalemia  Severe sepsis presented on admission with fever, leukocytosis, sinus tachycardia, hypoxia. Treat underline infection.  Acute hypoxic respiratory failure:  From sepsis and pna, currently on 5liter, wean as tolerated.  Cdiff: With recent abx exposure for uti treatment by pmd, due to disease severity, he is started on high dose oral vanc in addition to oral flagyl, will taper once diarrhea slows down and clinically more stable.  PNA: concerning for aspiration pneumonia CT :"RIGHT lower lobe consolidation concerning for pneumonia, possibly secondary to aspiration. Diffuse mild bronchial wall thickening can be seen with reactive airway dose disease or bronchitis. Secretions in the trachea and bronchi consistent with aspiration" Blood culture/sputum culture in process, urine legionella antigen pending, urine strep pneumo antigen negative Continue rocephin/zithro/flagyl that was started on admission  Dysphagia: swallow eval, DG esophagus ordered. May need Gi eval pending DG esophagus.   AKI : likely from dehydration and sepsis, ua no infection. Continue ivf, hold home meds lisinopril /HCTZ   COPD: no wheezing, not oxygen dependent at baseline, prior  smoker  Daily alcohol use, denies h/o alcohol withdrawal problems, will monitor  Progressive weakness, will need PT eval, may need to have spine imaging piror to discharge or to be scheduled by pmd, depends on patient's clinical condition, I have discussed this with patient and his wife, they expressed understanding.  Code Status: DNR  Family Communication: patient and wife  Disposition Plan: remain in stepdown   Consultants:  none  Procedures:  none  Antibiotics:  Rocephin/zithro  Oral vanc/flagyl   Objective: BP (!) 135/41 (BP Location: Left Arm)   Pulse (!) 119   Temp (!) 101 F (38.3 C) (Oral)   Resp (!) 27   Ht '5\' 10"'$  (1.778 m)   Wt 108.9 kg (240 lb)   SpO2 90%   BMI 34.44 kg/m   Intake/Output Summary (Last 24 hours) at 11/04/16 0821 Last data filed at 11/04/16 0600  Gross per 24 hour  Intake          1231.67 ml  Output              100 ml  Net          1131.67 ml   Filed Weights   11/03/16 1855  Weight: 108.9 kg (240 lb)    Exam:   General:  NAD  Cardiovascular: sinus tachycardia  Respiratory: diminished at basis  Abdomen: Soft/ND/NT, positive BS  Musculoskeletal: No Edema  Neuro: aaox3  Data Reviewed: Basic Metabolic Panel:  Recent Labs Lab 11/03/16 1846 11/04/16 0107  NA 133* 133*  K 5.4* 4.3  CL 95* 98*  CO2 26 25  GLUCOSE 115* 179*  BUN 40* 41*  CREATININE 1.73* 1.71*  CALCIUM 10.2 9.6   Liver Function Tests: No results for  input(s): AST, ALT, ALKPHOS, BILITOT, PROT, ALBUMIN in the last 168 hours. No results for input(s): LIPASE, AMYLASE in the last 168 hours. No results for input(s): AMMONIA in the last 168 hours. CBC:  Recent Labs Lab 11/03/16 1846 11/04/16 0326  WBC 14.8* 13.3*  HGB 11.0* 10.2*  HCT 33.4* 31.1*  MCV 109.2* 109.5*  PLT 200 188   Cardiac Enzymes:    Recent Labs Lab 11/03/16 1846  TROPONINI <0.03   BNP (last 3 results) No results for input(s): BNP in the last 8760 hours.  ProBNP  (last 3 results) No results for input(s): PROBNP in the last 8760 hours.  CBG:  Recent Labs Lab 11/03/16 1836  GLUCAP 116*    Recent Results (from the past 240 hour(s))  Culture, blood (Routine x 2)     Status: None (Preliminary result)   Collection Time: 11/03/16  6:49 PM  Result Value Ref Range Status   Specimen Description   Final    BLOOD LEFT ANTECUBITAL Performed at [facility]    Special Requests BOTTLES DRAWN AEROBIC AND ANAEROBIC 5CC  Final   Culture PENDING  Incomplete   Report Status PENDING  Incomplete  Culture, blood (Routine x 2)     Status: None (Preliminary result)   Collection Time: 11/03/16  7:20 PM  Result Value Ref Range Status   Specimen Description   Final    BLOOD LEFT FOREARM Performed at [facility]    Special Requests BOTTLES DRAWN AEROBIC AND ANAEROBIC 5ML  Final   Culture PENDING  Incomplete   Report Status PENDING  Incomplete  MRSA PCR Screening     Status: None   Collection Time: 11/04/16 12:53 AM  Result Value Ref Range Status   MRSA by PCR NEGATIVE NEGATIVE Final    Comment:        The GeneXpert MRSA Assay (FDA approved for NASAL specimens only), is one component of a comprehensive MRSA colonization surveillance program. It is not intended to diagnose MRSA infection nor to guide or monitor treatment for MRSA infections.   C difficile quick scan w PCR reflex     Status: Abnormal   Collection Time: 11/04/16  2:59 AM  Result Value Ref Range Status   C Diff antigen POSITIVE (A) NEGATIVE Final   C Diff toxin POSITIVE (A) NEGATIVE Final    Comment: CRITICAL RESULT CALLED TO, READ BACK BY AND VERIFIED WITH: M.STREBLE,RN [id number] 11/04/16 W.SHEA    C Diff interpretation Toxin producing C. difficile detected.  Final    Comment: CRITICAL RESULT CALLED TO, READ BACK BY AND VERIFIED WITH: M.STREBLE,RN [id number] 11/04/16 W.SHEA      Studies: Dg Chest 2 View  Result Date: 11/03/2016 CLINICAL DATA:  Shortness of breath,  diarrhea, fatigue. History of COPD, colon cancer. EXAM: CHEST  2 VIEW COMPARISON:  Chest radiograph July 06, 2013 FINDINGS: Cardiomediastinal silhouette is upper limits of normal in size. Mildly calcified aorta. No pleural effusion or focal consolidation. No pneumothorax. Strandy densities LEFT lung base. Small nodular density projecting RIGHT lung base. Old RIGHT anterior rib fractures. Severe RIGHT glenohumeral osteoarthrosis. Moderate degenerative change of thoracic spine. IMPRESSION: Borderline cardiomegaly.  LEFT lung base atelectasis. **An incidental finding of potential clinical significance has been found. Small nodular density RIGHT lung base. Recommend CT chest at 6-12 months.** Electronically Signed   By: Elon Alas M.D.   On: 11/03/2016 20:16   Ct Angio Chest Pe W And/or Wo Contrast  Result Date: 11/03/2016 CLINICAL DATA:  Shortness of  breath, fatigue. History of colon cancer, emphysema and COPD. EXAM: CT ANGIOGRAPHY CHEST WITH CONTRAST TECHNIQUE: Multidetector CT imaging of the chest was performed using the standard protocol during bolus administration of intravenous contrast. Multiplanar CT image reconstructions and MIPs were obtained to evaluate the vascular anatomy. CONTRAST:  80 cc Isovue 370 COMPARISON:  Chest radiograph November 03, 2016 at 1957 hours and CT chest May 16, 2013 FINDINGS: CARDIOVASCULAR: Adequate contrast opacification of the pulmonary artery's. Main pulmonary artery is not enlarged. No pulmonary arterial filling defects to the level of the subsegmental branches. Heart size is normal, no right heart strain. Mild coronary artery calcifications. No pericardial effusions. Thoracic aorta is normal course and caliber, mild calcific atherosclerosis. MEDIASTINUM/NODES: No lymphadenopathy by CT size criteria. Small RIGHT hilar lymph nodes without lymphadenopathy by CT size criteria. LUNGS/PLEURA: Mild bronchial wall thickening. Minimal secretions in the trachea mucous plugging  RIGHT lower lobe bronchi. Diffuse mild bronchial wall thickening. Hypo enhancing RIGHT lower lobe consolidation with air bronchograms. Moderate centrilobular emphysema. No pleural effusion. No pulmonary nodule or mass. LEFT lung base atelectasis. UPPER ABDOMEN: Included view of the abdomen is nonacute. Adrenal glands not included. Hepatic steatosis. MUSCULOSKELETAL: Visualized soft tissues and included osseous structures are nonacute. Severe RIGHT glenohumeral osteoarthrosis with undersurface spurring. Old RIGHT antral lateral rib fractures. Review of the MIP images confirms the above findings. IMPRESSION: No acute pulmonary embolism. RIGHT lower lobe consolidation concerning for pneumonia, possibly secondary to aspiration. Diffuse mild bronchial wall thickening can be seen with reactive airway dose disease or bronchitis. Secretions in the trachea and bronchi consistent with aspiration. Moderate emphysema. No pulmonary nodule, radiographic findings could be artifact or, secondary to RIGHT lung base consolidation. Electronically Signed   By: Elon Alas M.D.   On: 11/03/2016 23:15    Scheduled Meds: . albuterol  2.5 mg Nebulization TID  . aspirin EC  81 mg Oral q morning - 10a  . [START ON 11/05/2016] azithromycin  500 mg Intravenous Q24H  . cefTRIAXone (ROCEPHIN)  IV  1 g Intravenous Q24H  . enoxaparin (LOVENOX) injection  40 mg Subcutaneous Daily  . ferrous sulfate  325 mg Oral Q breakfast  . guaiFENesin  600 mg Oral BID  . lactobacillus  1 g Oral TID WC  . mouth rinse  15 mL Mouth Rinse BID  . metronidazole  500 mg Intravenous Q8H  . multivitamin with minerals  1 tablet Oral Daily  . sertraline  50 mg Oral Daily  . tiotropium  18 mcg Inhalation Daily  . vancomycin  500 mg Oral Q6H    Continuous Infusions: . sodium chloride 100 mL/hr at 11/04/16 0300     Time spent: 35 mins from 8 am to 8:35  Steffanie Mingle MD, PhD  [facility] Pager [phone]. If 7PM-7AM, please contact  night-coverage at [email or web address], password [facility] 11/04/2016, 8:21 AM  LOS: 1 day

## 2016-11-05 ENCOUNTER — Inpatient Hospital Stay (HOSPITAL_COMMUNITY): Payer: Medicare Other

## 2016-11-05 DIAGNOSIS — E875 Hyperkalemia: Secondary | ICD-10-CM

## 2016-11-05 LAB — CBC
HEMATOCRIT: 30.9 % — AB (ref 39.0–52.0)
Hemoglobin: 10 g/dL — ABNORMAL LOW (ref 13.0–17.0)
MCH: 35.7 pg — AB (ref 26.0–34.0)
MCHC: 32.4 g/dL (ref 30.0–36.0)
MCV: 110.4 fL — AB (ref 78.0–100.0)
Platelets: 174 10*3/uL (ref 150–400)
RBC: 2.8 MIL/uL — ABNORMAL LOW (ref 4.22–5.81)
RDW: 18 % — AB (ref 11.5–15.5)
WBC: 12.6 10*3/uL — ABNORMAL HIGH (ref 4.0–10.5)

## 2016-11-05 LAB — BASIC METABOLIC PANEL
Anion gap: 6 (ref 5–15)
BUN: 33 mg/dL — AB (ref 6–20)
CALCIUM: 9.1 mg/dL (ref 8.9–10.3)
CO2: 26 mmol/L (ref 22–32)
CREATININE: 1.49 mg/dL — AB (ref 0.61–1.24)
Chloride: 105 mmol/L (ref 101–111)
GFR calc Af Amer: 49 mL/min — ABNORMAL LOW (ref >60)
GFR calc non Af Amer: 43 mL/min — ABNORMAL LOW (ref >60)
GLUCOSE: 116 mg/dL — AB (ref 65–99)
Potassium: 3.9 mmol/L (ref 3.5–5.1)
Sodium: 137 mmol/L (ref 135–145)

## 2016-11-05 LAB — URINE CULTURE: CULTURE: NO GROWTH

## 2016-11-05 LAB — HEPATIC FUNCTION PANEL
ALT: 34 U/L (ref 17–63)
AST: 33 U/L (ref 15–41)
Albumin: 2.7 g/dL — ABNORMAL LOW (ref 3.5–5.0)
Alkaline Phosphatase: 68 U/L (ref 38–126)
BILIRUBIN TOTAL: 0.6 mg/dL (ref 0.3–1.2)
Total Protein: 6.1 g/dL — ABNORMAL LOW (ref 6.5–8.1)

## 2016-11-05 NOTE — Progress Notes (Signed)
PHARMACY NOTE -  ANTIBIOTIC RENAL DOSE ADJUSTMENT    Request received for Pharmacy to assist with antibiotic renal dose adjustment.   Patient has been initiated on Rocephin and azithromycin for CAP; PO vancomycin for Cdiff, and Flagyl for Cdiff/aspiration PNA.  None of the above agents requires renal adjustment for current indications  Will sign off at this time.  Please reconsult if a change in clinical status warrants re-evaluation of dosage.  Reuel Boom, PharmD, BCPS Pager: [phone] 11/05/2016, 1:39 PM

## 2016-11-05 NOTE — Evaluation (Signed)
Physical Therapy Evaluation Patient Details Name: David Barton MRN: [id number] DOB: [birth year] Today's Date: 11/05/2016   History of Present Illness   David Barton is a 82 y.o. male with medical history significant of Colon cancer,COPD, HTN who presents complaining of generalized weakness, unable to ambulate, diarrhea.     CT angio: RIGHT lower lobe consolidation concerning for pneumonia, possibly secondary to aspiration.  Secretions in the trachea and bronchi consistent with aspiration . Positive for C diff.  Clinical Impression  The patient reports feeling much weaker than he anticipated.He was able to stand and take a few steps with RW. Pt admitted with above diagnosis. Pt currently with functional limitations due to the deficits listed below (see PT Problem List). Pt will benefit from skilled PT to increase their independence and safety with mobility to allow discharge to the venue listed below.  He should progress to return to functional level to DC home.      Follow Up Recommendations Home health PT;Supervision/Assistance - 24 hour    Equipment Recommendations   (TBD)    Recommendations for Other Services       Precautions / Restrictions Precautions Precautions: Fall Precaution Comments: Flexiseal, monitor sats      Mobility  Bed Mobility Overal bed mobility: Needs Assistance Bed Mobility: Supine to Sit;Sit to Supine     Supine to sit: Mod assist Sit to supine: Mod assist   General bed mobility comments: assist with trunk and legs to sit up and to return to supine  Transfers Overall transfer level: Needs assistance Equipment used: Rolling walker (2 wheeled) Transfers: Sit to/from Stand Sit to Stand: Min assist         General transfer comment: able to stand up without assitance at the RW  Ambulation/Gait Ambulation/Gait assistance: Min assist;+2 safety/equipment   Assistive device: Rolling walker (2 wheeled) Gait Pattern/deviations: Step-to pattern      General Gait Details: 4 steps forward and back, 4 sideways with RW.  Stairs            Wheelchair Mobility    Modified Rankin (Stroke Patients Only)       Balance                                             Pertinent Vitals/Pain Pain Assessment: No/denies pain    Home Living Family/patient expects to be discharged to:: Private residence Living Arrangements: Spouse/significant other Available Help at Discharge: Family Type of Home: House Home Access: Stairs to enter     Home Layout: One level Home Equipment: None      Prior Function Level of Independence: Independent               Hand Dominance        Extremity/Trunk Assessment   Upper Extremity Assessment: Generalized weakness           Lower Extremity Assessment: Generalized weakness      Cervical / Trunk Assessment: Normal  Communication   Communication: No difficulties  Cognition Arousal/Alertness: Awake/alert Behavior During Therapy: WFL for tasks assessed/performed Overall Cognitive Status: Within Functional Limits for tasks assessed                      General Comments      Exercises     Assessment/Plan    PT Assessment Patient needs continued PT services  PT Problem List Decreased strength;Decreased activity tolerance;Decreased safety awareness;Decreased knowledge of precautions;Cardiopulmonary status limiting activity;Decreased knowledge of use of DME          PT Treatment Interventions DME instruction;Gait training;Functional mobility training;Therapeutic activities;Therapeutic exercise;Patient/family education    PT Goals (Current goals can be found in the Care Plan section)  Acute Rehab PT Goals Patient Stated Goal: to get stronger PT Goal Formulation: With patient Time For Goal Achievement: 11/19/16 Potential to Achieve Goals: Good    Frequency Min 3X/week   Barriers to discharge        Co-evaluation                End of Session Equipment Utilized During Treatment: Oxygen Activity Tolerance: Patient tolerated treatment well Patient left: in bed;with call bell/phone within reach;with bed alarm set Nurse Communication: Mobility status         Time: 1410-1430 PT Time Calculation (min) (ACUTE ONLY): 20 min   Charges:   PT Evaluation $PT Eval Low Complexity: 1 Procedure     PT G CodesClaretha Cooper 11/05/2016, 3:01 PM Tresa Endo PT [phone]

## 2016-11-05 NOTE — Progress Notes (Signed)
PROGRESS NOTE  David Barton:[id number] DOB: Dec 04, 1933 DOA: 11/03/2016 PCP: Vena Austria, MD  HPI/Recap of past 24 hours:  Pt reports feeling better, no abd pain or nausea, vomiting, .  Has rectal tube with liquid stool.  Is on 3 lit of [place] oxygen .  Recommended to get out of bed  Assessment/Plan: Active Problems:   COPD  GOLD II with reversibility   Essential hypertension   CAP (community acquired pneumonia)   PNA (pneumonia)   Diarrhea   AKI (acute kidney injury) ([place])   Hyperkalemia  Severe sepsis presented on admission with fever, leukocytosis, sinus tachycardia, hypoxia.  Secondary to aspiration pneumonia and C diff diarrhea. He was started on IV antibiotics and currently on [place] oxygen to seek sats greater than 90%. Blood cultures done on admission and negative so far. Sputum cultures are done and pending. Febrile overnight and leukocytosis is improving. Lactic acid normalized.     Acute hypoxic respiratory failure:  From sepsis and pna, currently on 3liter, wean as tolerated.  Cdiff: With recent abx exposure for uti treatment by pmd, due to disease severity, he is started on high dose oral vanc in addition to oral flagyl, will taper once diarrhea slows down and clinically more stable.  PNA: concerning for aspiration pneumonia CT :"RIGHT lower lobe consolidation concerning for pneumonia, possibly secondary to aspiration. Diffuse mild bronchial wall thickening can be seen with reactive airway dose disease or bronchitis. Secretions in the trachea and bronchi consistent with aspiration" Blood culture/sputum culture in process, urine legionella antigen pending, urine strep pneumo antigen negative Continue rocephin/zithro/flagyl that was started on admission  Dysphagia: swallow eval, DG esophagus ordered showed esophageal dysmotility. Recommend GI consult on discharge or when his pneumonia and his sepsis improves.    AKI : likely from dehydration and sepsis,  ua no infection. Continue ivf, hold home meds lisinopril /HCTZ. Improving with hydration.    COPD: no wheezing, not oxygen dependent at baseline, prior smoker.  Resume [place] ozygen to keep sats greater than 90%.   Daily alcohol use, denies h/o alcohol withdrawal problems, will monitor  Progressive weakness, will need PT eval, may need to have spine imaging piror to discharge or to be scheduled by pmd, depends on patient's clinical condition, I have discussed this with patient and his wife, they expressed understanding.  Code Status: DNR  Family Communication: patient and wife  Disposition Plan: remain in stepdown   Consultants:  none  Procedures:  none  Antibiotics:  Rocephin/zithro  Oral vanc/flagyl   Objective: BP 125/61 (BP Location: Left Arm)   Pulse (!) 103   Temp 98.2 F (36.8 C) (Oral)   Resp 17   Ht '5\' 10"'$  (1.778 m)   Wt 108.9 kg (240 lb)   SpO2 (!) 89%   BMI 34.44 kg/m   Intake/Output Summary (Last 24 hours) at 11/05/16 1408 Last data filed at 11/05/16 1300  Gross per 24 hour  Intake             2250 ml  Output              800 ml  Net             1450 ml   Filed Weights   11/03/16 1855  Weight: 108.9 kg (240 lb)    Exam:   General:  NAD  Cardiovascular: sinus tachycardia  Respiratory: diminished at bases no wheezing.  Abdomen: Soft/ND/NT, positive BS, g tube in place.   Musculoskeletal: No Edema  Neuro: aaox3  Data Reviewed: Basic Metabolic Panel:  Recent Labs Lab 11/03/16 1846 11/04/16 0107 11/05/16 0307  NA 133* 133* 137  K 5.4* 4.3 3.9  CL 95* 98* 105  CO2 '26 25 26  '$ GLUCOSE 115* 179* 116*  BUN 40* 41* 33*  CREATININE 1.73* 1.71* 1.49*  CALCIUM 10.2 9.6 9.1   Liver Function Tests:  Recent Labs Lab 11/05/16 0307  AST 33  ALT 34  ALKPHOS 68  BILITOT 0.6  PROT 6.1*  ALBUMIN 2.7*   No results for input(s): LIPASE, AMYLASE in the last 168 hours. No results for input(s): AMMONIA in the last 168  hours. CBC:  Recent Labs Lab 11/03/16 1846 11/04/16 0326 11/05/16 0307  WBC 14.8* 13.3* 12.6*  HGB 11.0* 10.2* 10.0*  HCT 33.4* 31.1* 30.9*  MCV 109.2* 109.5* 110.4*  PLT 200 188 174   Cardiac Enzymes:    Recent Labs Lab 11/03/16 1846  TROPONINI <0.03   BNP (last 3 results) No results for input(s): BNP in the last 8760 hours.  ProBNP (last 3 results) No results for input(s): PROBNP in the last 8760 hours.  CBG:  Recent Labs Lab 11/03/16 1836  GLUCAP 116*    Recent Results (from the past 240 hour(s))  Urine culture     Status: None   Collection Time: 11/03/16  6:00 PM  Result Value Ref Range Status   Specimen Description URINE, CLEAN CATCH  Final   Special Requests NONE  Final   Culture NO GROWTH Performed at [facility]   Final   Report Status 11/05/2016 FINAL  Final  Culture, blood (Routine x 2)     Status: None (Preliminary result)   Collection Time: 11/03/16  6:49 PM  Result Value Ref Range Status   Specimen Description BLOOD LEFT ANTECUBITAL  Final   Special Requests BOTTLES DRAWN AEROBIC AND ANAEROBIC 5CC  Final   Culture   Final    NO GROWTH < 24 HOURS Performed at [facility]    Report Status PENDING  Incomplete  Culture, blood (Routine x 2)     Status: None (Preliminary result)   Collection Time: 11/03/16  7:20 PM  Result Value Ref Range Status   Specimen Description BLOOD LEFT FOREARM  Final   Special Requests BOTTLES DRAWN AEROBIC AND ANAEROBIC 5ML  Final   Culture   Final    NO GROWTH < 24 HOURS Performed at [facility]    Report Status PENDING  Incomplete  MRSA PCR Screening     Status: None   Collection Time: 11/04/16 12:53 AM  Result Value Ref Range Status   MRSA by PCR NEGATIVE NEGATIVE Final    Comment:        The GeneXpert MRSA Assay (FDA approved for NASAL specimens only), is one component of a comprehensive MRSA colonization surveillance program. It is not intended to diagnose MRSA infection nor to  guide or monitor treatment for MRSA infections.   C difficile quick scan w PCR reflex     Status: Abnormal   Collection Time: 11/04/16  2:59 AM  Result Value Ref Range Status   C Diff antigen POSITIVE (A) NEGATIVE Final   C Diff toxin POSITIVE (A) NEGATIVE Final    Comment: CRITICAL RESULT CALLED TO, READ BACK BY AND VERIFIED WITH: M.STREBLE,RN [id number] 11/04/16 W.SHEA    C Diff interpretation Toxin producing C. difficile detected.  Final    Comment: CRITICAL RESULT CALLED TO, READ BACK BY AND VERIFIED WITH: M.STREBLE,RN [phone]  11/04/16 W.SHEA   Culture, sputum-assessment     Status: None   Collection Time: 11/04/16  6:34 PM  Result Value Ref Range Status   Specimen Description EXPECTORATED SPUTUM  Final   Special Requests NONE  Final   Sputum evaluation   Final    THIS SPECIMEN IS ACCEPTABLE. RESPIRATORY CULTURE REPORT TO FOLLOW.   Report Status 11/04/2016 FINAL  Final  Culture, respiratory (NON-Expectorated)     Status: None (Preliminary result)   Collection Time: 11/04/16  6:34 PM  Result Value Ref Range Status   Specimen Description SPUTUM  Final   Special Requests NONE  Final   Gram Stain   Final    MODERATE WBC PRESENT, PREDOMINANTLY PMN MODERATE GRAM POSITIVE COCCI IN PAIRS FEW GRAM NEGATIVE RODS FEW GRAM VARIABLE ROD RARE YEAST    Culture   Final    TOO YOUNG TO READ Performed at [facility]    Report Status PENDING  Incomplete     Studies: Dg Esophagus  Result Date: 11/05/2016 CLINICAL DATA:  Dysphagia. EXAM: ESOPHOGRAM/BARIUM SWALLOW TECHNIQUE: Single contrast examination was performed using  thin barium. FLUOROSCOPY TIME:  Fluoroscopy Time:  1 minutes 30 seconds Radiation Exposure Index (if provided by the fluoroscopic device): Number of Acquired Spot Images: 0 COMPARISON:  None. FINDINGS: Moderate esophageal dysmotility. No stricture or mass. Barium passes readily into the stomach. No hiatal hernia. Stomach grossly normal. The patient swallowed barium  while supine for the study. IMPRESSION: Esophageal dysmotility.  Negative for stricture or mass.  No hernia. Electronically Signed   By: Franchot Gallo M.D.   On: 11/05/2016 09:30    Scheduled Meds: . albuterol  2.5 mg Nebulization TID  . aspirin EC  81 mg Oral q morning - 10a  . azithromycin  500 mg Oral QHS  . cefTRIAXone (ROCEPHIN)  IV  1 g Intravenous Q24H  . enoxaparin (LOVENOX) injection  40 mg Subcutaneous Daily  . ferrous sulfate  325 mg Oral Q breakfast  . guaiFENesin  600 mg Oral BID  . lactobacillus  1 g Oral TID WC  . mouth rinse  15 mL Mouth Rinse BID  . metroNIDAZOLE  500 mg Oral Q8H  . multivitamin with minerals  1 tablet Oral Daily  . sertraline  50 mg Oral Daily  . tiotropium  18 mcg Inhalation Daily  . vancomycin  500 mg Oral Q6H    Continuous Infusions: . sodium chloride 100 mL/hr at 11/05/16 0403     Time spent: 30 minutes.   Revia Nghiem MD. [facility] Pager [phone] If 7PM-7AM, please contact night-coverage at [email or web address], password [facility] 11/05/2016, 2:08 PM  LOS: 2 days

## 2016-11-06 DIAGNOSIS — A09 Infectious gastroenteritis and colitis, unspecified: Secondary | ICD-10-CM

## 2016-11-06 LAB — BASIC METABOLIC PANEL
ANION GAP: 8 (ref 5–15)
BUN: 29 mg/dL — ABNORMAL HIGH (ref 6–20)
CALCIUM: 9.1 mg/dL (ref 8.9–10.3)
CHLORIDE: 107 mmol/L (ref 101–111)
CO2: 25 mmol/L (ref 22–32)
CREATININE: 1.39 mg/dL — AB (ref 0.61–1.24)
GFR calc non Af Amer: 46 mL/min — ABNORMAL LOW (ref >60)
GFR, EST AFRICAN AMERICAN: 54 mL/min — AB (ref >60)
Glucose, Bld: 100 mg/dL — ABNORMAL HIGH (ref 65–99)
Potassium: 4.1 mmol/L (ref 3.5–5.1)
SODIUM: 140 mmol/L (ref 135–145)

## 2016-11-06 LAB — HEMOGLOBIN A1C
HEMOGLOBIN A1C: 5.2 % (ref 4.8–5.6)
MEAN PLASMA GLUCOSE: 103 mg/dL

## 2016-11-06 LAB — CBC
HCT: 31.2 % — ABNORMAL LOW (ref 39.0–52.0)
HEMOGLOBIN: 10 g/dL — AB (ref 13.0–17.0)
MCH: 35 pg — ABNORMAL HIGH (ref 26.0–34.0)
MCHC: 32.1 g/dL (ref 30.0–36.0)
MCV: 109.1 fL — ABNORMAL HIGH (ref 78.0–100.0)
Platelets: 184 10*3/uL (ref 150–400)
RBC: 2.86 MIL/uL — ABNORMAL LOW (ref 4.22–5.81)
RDW: 17.5 % — AB (ref 11.5–15.5)
WBC: 7.4 10*3/uL (ref 4.0–10.5)

## 2016-11-06 LAB — LEGIONELLA PNEUMOPHILA SEROGP 1 UR AG: L. pneumophila Serogp 1 Ur Ag: NEGATIVE

## 2016-11-06 NOTE — Progress Notes (Signed)
PROGRESS NOTE  David Barton IEP:[id number] DOB: Oct 05, 1933 DOA: 11/03/2016 PCP: Vena Austria, MD  HPI/Recap of past 24 hours:  No new complaints,  Wants to go home rather than SNF.  Diarrhea improved Currently on 5 lit of [place] oxygen.   Assessment/Plan: Active Problems:   COPD  GOLD II with reversibility   Essential hypertension   CAP (community acquired pneumonia)   PNA (pneumonia)   Diarrhea   AKI (acute kidney injury) ([place])   Hyperkalemia  Severe sepsis presented on admission with fever, leukocytosis, sinus tachycardia, hypoxia.  Secondary to aspiration pneumonia and C diff diarrhea. He was started on IV antibiotics and currently on [place] oxygen to seek sats greater than 90%. Blood cultures done on admission and negative so far. Sputum cultures are done and pending. No fever overnight,  leukocytosis resolved.  Lactic acid normalized.     Acute hypoxic respiratory failure:  From sepsis and pna, currently on 5 liter, wean as tolerated.  Cdiff: With recent abx exposure for uti treatment by pmd, due to disease severity, he is started on high dose oral vanc in addition to oral flagyl, will taper once diarrhea slows down and clinically more stable.currently has rectal tube, diarrhea seems to be improving.   PNA: concerning for aspiration pneumonia CT :"RIGHT lower lobe consolidation concerning for pneumonia, possibly secondary to aspiration. Diffuse mild bronchial wall thickening can be seen with reactive airway dose disease or bronchitis. Secretions in the trachea and bronchi consistent with aspiration" Blood culture/sputum culture in process, urine legionella antigen negative, urine strep pneumo antigen negative Continue rocephin/zithro/flagyl that was started on admission  Dysphagia: swallow eval, DG esophagus ordered showed esophageal dysmotility. Recommend GI consult on discharge or when his pneumonia and his sepsis improves.    AKI : likely from dehydration and  sepsis, ua no infection. Continue ivf, hold home meds lisinopril /HCTZ. Improving with hydration.  Renal parameters improving,.    COPD: no wheezing, not oxygen dependent at baseline, prior smoker.  Resume [place] ozygen to keep sats greater than 90%.   Daily alcohol use, denies h/o alcohol withdrawal problems, will monitor  Progressive weakness, will need PT eval, may need to have spine imaging piror to discharge or to be scheduled by pmd, depends on patient's clinical condition, I have discussed this with patient and his wife, they expressed understanding.  Code Status: DNR  Family Communication: patient and wife  Disposition Plan: transfer to telemetry.    Consultants:  none  Procedures:  none  Antibiotics:  Rocephin/zithro  Oral vanc/flagyl   Objective: BP (!) 154/97   Pulse 95   Temp 97.9 F (36.6 C) (Oral)   Resp (!) 22   Ht '5\' 10"'$  (1.778 m)   Wt 108.9 kg (240 lb)   SpO2 92%   BMI 34.44 kg/m   Intake/Output Summary (Last 24 hours) at 11/06/16 1714 Last data filed at 11/06/16 1500  Gross per 24 hour  Intake             2100 ml  Output              800 ml  Net             1300 ml   Filed Weights   11/03/16 1855  Weight: 108.9 kg (240 lb)    Exam:   General:  NAD on 5 lit of [place] oxygen.   Cardiovascular: sinus tachycardia  Respiratory: diminished at bases no wheezing.  Abdomen: Soft/ND/NT, positive BS, g tube in  place.   Musculoskeletal: No Edema  Neuro: aaox3  Data Reviewed: Basic Metabolic Panel:  Recent Labs Lab 11/03/16 1846 11/04/16 0107 11/05/16 0307 11/06/16 0307  NA 133* 133* 137 140  K 5.4* 4.3 3.9 4.1  CL 95* 98* 105 107  CO2 '26 25 26 25  '$ GLUCOSE 115* 179* 116* 100*  BUN 40* 41* 33* 29*  CREATININE 1.73* 1.71* 1.49* 1.39*  CALCIUM 10.2 9.6 9.1 9.1   Liver Function Tests:  Recent Labs Lab 11/05/16 0307  AST 33  ALT 34  ALKPHOS 68  BILITOT 0.6  PROT 6.1*  ALBUMIN 2.7*   No results for input(s): LIPASE, AMYLASE  in the last 168 hours. No results for input(s): AMMONIA in the last 168 hours. CBC:  Recent Labs Lab 11/03/16 1846 11/04/16 0326 11/05/16 0307 11/06/16 0307  WBC 14.8* 13.3* 12.6* 7.4  HGB 11.0* 10.2* 10.0* 10.0*  HCT 33.4* 31.1* 30.9* 31.2*  MCV 109.2* 109.5* 110.4* 109.1*  PLT 200 188 174 184   Cardiac Enzymes:    Recent Labs Lab 11/03/16 1846  TROPONINI <0.03   BNP (last 3 results) No results for input(s): BNP in the last 8760 hours.  ProBNP (last 3 results) No results for input(s): PROBNP in the last 8760 hours.  CBG:  Recent Labs Lab 11/03/16 1836  GLUCAP 116*    Recent Results (from the past 240 hour(s))  Urine culture     Status: None   Collection Time: 11/03/16  6:00 PM  Result Value Ref Range Status   Specimen Description URINE, CLEAN CATCH  Final   Special Requests NONE  Final   Culture NO GROWTH Performed at [facility]   Final   Report Status 11/05/2016 FINAL  Final  Culture, blood (Routine x 2)     Status: None (Preliminary result)   Collection Time: 11/03/16  6:49 PM  Result Value Ref Range Status   Specimen Description BLOOD LEFT ANTECUBITAL  Final   Special Requests BOTTLES DRAWN AEROBIC AND ANAEROBIC 5CC  Final   Culture   Final    NO GROWTH 3 DAYS Performed at [facility]    Report Status PENDING  Incomplete  Culture, blood (Routine x 2)     Status: None (Preliminary result)   Collection Time: 11/03/16  7:20 PM  Result Value Ref Range Status   Specimen Description BLOOD LEFT FOREARM  Final   Special Requests BOTTLES DRAWN AEROBIC AND ANAEROBIC 5ML  Final   Culture   Final    NO GROWTH 3 DAYS Performed at [facility]    Report Status PENDING  Incomplete  MRSA PCR Screening     Status: None   Collection Time: 11/04/16 12:53 AM  Result Value Ref Range Status   MRSA by PCR NEGATIVE NEGATIVE Final    Comment:        The GeneXpert MRSA Assay (FDA approved for NASAL specimens only), is one component of  a comprehensive MRSA colonization surveillance program. It is not intended to diagnose MRSA infection nor to guide or monitor treatment for MRSA infections.   C difficile quick scan w PCR reflex     Status: Abnormal   Collection Time: 11/04/16  2:59 AM  Result Value Ref Range Status   C Diff antigen POSITIVE (A) NEGATIVE Final   C Diff toxin POSITIVE (A) NEGATIVE Final    Comment: CRITICAL RESULT CALLED TO, READ BACK BY AND VERIFIED WITH: M.STREBLE,RN [id number] 11/04/16 W.SHEA    C Diff interpretation Toxin  producing C. difficile detected.  Final    Comment: CRITICAL RESULT CALLED TO, READ BACK BY AND VERIFIED WITH: M.STREBLE,RN [id number] 11/04/16 W.SHEA   Culture, sputum-assessment     Status: None   Collection Time: 11/04/16  6:34 PM  Result Value Ref Range Status   Specimen Description EXPECTORATED SPUTUM  Final   Special Requests NONE  Final   Sputum evaluation   Final    THIS SPECIMEN IS ACCEPTABLE. RESPIRATORY CULTURE REPORT TO FOLLOW.   Report Status 11/04/2016 FINAL  Final  Culture, respiratory (NON-Expectorated)     Status: None (Preliminary result)   Collection Time: 11/04/16  6:34 PM  Result Value Ref Range Status   Specimen Description SPUTUM  Final   Special Requests NONE  Final   Gram Stain   Final    MODERATE WBC PRESENT, PREDOMINANTLY PMN MODERATE GRAM POSITIVE COCCI IN PAIRS FEW GRAM NEGATIVE RODS FEW GRAM VARIABLE ROD RARE YEAST    Culture   Final    CULTURE REINCUBATED FOR BETTER GROWTH Performed at [facility]    Report Status PENDING  Incomplete     Studies: No results found.  Scheduled Meds: . albuterol  2.5 mg Nebulization TID  . aspirin EC  81 mg Oral q morning - 10a  . azithromycin  500 mg Oral QHS  . cefTRIAXone (ROCEPHIN)  IV  1 g Intravenous Q24H  . enoxaparin (LOVENOX) injection  40 mg Subcutaneous Daily  . ferrous sulfate  325 mg Oral Q breakfast  . guaiFENesin  600 mg Oral BID  . lactobacillus  1 g Oral TID WC  . mouth rinse  15  mL Mouth Rinse BID  . metroNIDAZOLE  500 mg Oral Q8H  . multivitamin with minerals  1 tablet Oral Daily  . sertraline  50 mg Oral Daily  . tiotropium  18 mcg Inhalation Daily  . vancomycin  500 mg Oral Q6H    Continuous Infusions: . sodium chloride 100 mL/hr at 11/06/16 0803     Time spent: 30 minutes.   Claudetta Sallie MD. [facility] Pager [phone] If 7PM-7AM, please contact night-coverage at [email or web address], password [facility] 11/06/2016, 5:14 PM  LOS: 3 days

## 2016-11-06 NOTE — Progress Notes (Signed)
Patient transferred from ICU department. Patient is stable with no complaints at this time. Telemetry applied and patient orient to the room and equipment. Patient was on 3L oxygen via nasal canuula; was able to wean him to 2L with sats of 92%. Tried to return patient to room air, but sats were maintaining 89%. Will continue to monitor patient and wean oxygen as able.

## 2016-11-07 LAB — CBC WITH DIFFERENTIAL/PLATELET
Basophils Absolute: 0 10*3/uL (ref 0.0–0.1)
Basophils Relative: 0 %
Eosinophils Absolute: 0.2 10*3/uL (ref 0.0–0.7)
Eosinophils Relative: 3 %
HEMATOCRIT: 31.2 % — AB (ref 39.0–52.0)
Hemoglobin: 10.1 g/dL — ABNORMAL LOW (ref 13.0–17.0)
LYMPHS ABS: 0.8 10*3/uL (ref 0.7–4.0)
LYMPHS PCT: 12 %
MCH: 35.8 pg — AB (ref 26.0–34.0)
MCHC: 32.4 g/dL (ref 30.0–36.0)
MCV: 110.6 fL — AB (ref 78.0–100.0)
MONO ABS: 1 10*3/uL (ref 0.1–1.0)
MONOS PCT: 15 %
NEUTROS ABS: 4.7 10*3/uL (ref 1.7–7.7)
Neutrophils Relative %: 70 %
Platelets: 196 10*3/uL (ref 150–400)
RBC: 2.82 MIL/uL — ABNORMAL LOW (ref 4.22–5.81)
RDW: 17.6 % — AB (ref 11.5–15.5)
WBC: 6.7 10*3/uL (ref 4.0–10.5)

## 2016-11-07 LAB — BASIC METABOLIC PANEL
Anion gap: 6 (ref 5–15)
BUN: 22 mg/dL — ABNORMAL HIGH (ref 6–20)
CALCIUM: 9 mg/dL (ref 8.9–10.3)
CHLORIDE: 112 mmol/L — AB (ref 101–111)
CO2: 24 mmol/L (ref 22–32)
CREATININE: 1.35 mg/dL — AB (ref 0.61–1.24)
GFR calc Af Amer: 56 mL/min — ABNORMAL LOW (ref >60)
GFR calc non Af Amer: 48 mL/min — ABNORMAL LOW (ref >60)
GLUCOSE: 101 mg/dL — AB (ref 65–99)
Potassium: 4 mmol/L (ref 3.5–5.1)
Sodium: 142 mmol/L (ref 135–145)

## 2016-11-07 LAB — CULTURE, RESPIRATORY

## 2016-11-07 LAB — CULTURE, RESPIRATORY W GRAM STAIN: Culture: NORMAL

## 2016-11-07 MED ORDER — LISINOPRIL 20 MG PO TABS
20.0000 mg | ORAL_TABLET | Freq: Every day | ORAL | Status: DC
  Administered 2016-11-07 – 2016-11-13 (×7): 20 mg via ORAL
  Filled 2016-11-07 (×7): qty 1

## 2016-11-07 MED ORDER — SODIUM CHLORIDE 0.9 % IV BOLUS (SEPSIS)
500.0000 mL | Freq: Once | INTRAVENOUS | Status: AC
  Administered 2016-11-07: 500 mL via INTRAVENOUS

## 2016-11-07 MED ORDER — ALBUTEROL SULFATE (2.5 MG/3ML) 0.083% IN NEBU
2.5000 mg | INHALATION_SOLUTION | Freq: Two times a day (BID) | RESPIRATORY_TRACT | Status: DC
  Administered 2016-11-07: 2.5 mg via RESPIRATORY_TRACT
  Filled 2016-11-07: qty 3

## 2016-11-07 MED ORDER — HYDRALAZINE HCL 20 MG/ML IJ SOLN: 5.0000 mg | Freq: Four times a day (QID) | INTRAMUSCULAR | Status: DC | PRN

## 2016-11-07 MED ORDER — HYDROCHLOROTHIAZIDE 12.5 MG PO CAPS
12.5000 mg | ORAL_CAPSULE | Freq: Every day | ORAL | Status: DC
  Administered 2016-11-07 – 2016-11-10 (×4): 12.5 mg via ORAL
  Filled 2016-11-07 (×5): qty 1

## 2016-11-07 NOTE — Progress Notes (Signed)
Physical Therapy Treatment Patient Details Name: David Barton MRN: [id number] DOB: 1933-01-04 Today's Date: 11/07/2016    History of Present Illness  David Barton is a 82 y.o. male with medical history significant of Colon cancer,COPD, HTN who presents complaining of generalized weakness, unable to ambulate, diarrhea.     CT angio: RIGHT lower lobe consolidation concerning for pneumonia, possibly secondary to aspiration. Diffuse mild bronchial wall thickening can be seen with reactive airway dose disease or bronchitis. Secretions in the trachea and bronchi consistent with aspiration     PT Comments    CNA in to take  VS's. BP was elevated over 408 diastolic. BP taken 3 times, cuff size changed and manual taken. BP 170/104 manually. RN aware. Unable to mobilize today.   Follow Up Recommendations  SNF;Home health PT (due to medical complications, may need to consider SNF rehab.)     Equipment Recommendations  None recommended by PT    Recommendations for Other Services       Precautions / Restrictions Precautions Precautions: Fall Precaution Comments: Flexiseal, monitor sats and BP, on oxygen    Mobility  Bed Mobility               General bed mobility comments: unable to assist due to Hypertension: 170/104 manually. RN aware.   Transfers                    Ambulation/Gait                 Stairs            Wheelchair Mobility    Modified Rankin (Stroke Patients Only)       Balance                                    Cognition Arousal/Alertness: Awake/alert                          Exercises      General Comments        Pertinent Vitals/Pain Pain Assessment: No/denies pain    Home Living                      Prior Function            PT Goals (current goals can now be found in the care plan section) Progress towards PT goals: Not progressing toward goals - comment (due to  hypertension today.)    Frequency    Min 3X/week      PT Plan Discharge plan needs to be updated    Co-evaluation             End of Session Equipment Utilized During Treatment: Oxygen Activity Tolerance: Treatment limited secondary to medical complications (Comment) (hypertension) Patient left: in bed;with call bell/phone within reach;with nursing/sitter in room     Time: 1448-1856 PT Time Calculation (min) (ACUTE ONLY): 20 min  Charges:  $Therapeutic Activity: 8-22 mins                    G Codes:      Claretha Cooper 11/07/2016, 3:29 PM  Tresa Endo PT [phone]

## 2016-11-07 NOTE — Progress Notes (Signed)
PROGRESS NOTE  David Barton VVO:[id number] DOB: 1933-01-06 DOA: 11/03/2016 PCP: Vena Austria, MD  HPI/Recap of past 24 hours:  No new complaints,  Wants to go home rather than SNF.  Diarrhea slightly  improved   Assessment/Plan: Active Problems:   COPD  GOLD II with reversibility   Essential hypertension   CAP (community acquired pneumonia)   PNA (pneumonia)   Diarrhea   AKI (acute kidney injury) ([place])   Hyperkalemia  Severe sepsis presented on admission with fever, leukocytosis, sinus tachycardia, hypoxia.  Secondary to aspiration pneumonia and C diff diarrhea. He was started on IV antibiotics and currently on [place] oxygen to seek sats greater than 90%. Blood cultures done on admission and negative so far. Sputum cultures are done and pending. No fever overnight,  leukocytosis resolved.  Lactic acid normalized. Weaned him down to 3 lit of [place] oxygen.     Acute hypoxic respiratory failure:  From sepsis and pna, currently on 3 liter, wean as tolerated. rpeat CXR in am.   Cdiff: With recent abx exposure for uti treatment by pmd, due to disease severity, he is started on high dose oral vanc in addition to oral flagyl, will taper once diarrhea slows down and clinically more stable.currently has rectal tube, diarrhea seems to be improving.   PNA: concerning for aspiration pneumonia CT :"RIGHT lower lobe consolidation concerning for pneumonia, possibly secondary to aspiration. Diffuse mild bronchial wall thickening can be seen with reactive airway dose disease or bronchitis. Secretions in the trachea and bronchi consistent with aspiration" Blood culture/sputum culture in process, urine legionella antigen negative, urine strep pneumo antigen negative Continue rocephin/zithro/flagyl that was started on admission  Dysphagia: swallow eval, DG esophagus ordered showed esophageal dysmotility. Recommend GI consult on discharge or when his pneumonia and his sepsis improves.     AKI : likely from dehydration and sepsis, ua no infection. Continue ivf, hold home meds lisinopril /HCTZ. Improving with hydration.  Renal parameters improving,.    COPD: no wheezing, not oxygen dependent at baseline, prior smoker.  Resume [place] ozygen to keep sats greater than 90%.   Daily alcohol use, denies h/o alcohol withdrawal problems, will monitor  Progressive weakness, will need PT eval, may need to have spine imaging piror to discharge or to be scheduled by pmd, depends on patient's clinical condition, I have discussed this with patient and his wife, they expressed understanding.  Hypertension:  Accelerated, resume home homes and added hydralazine prn.   Code Status: DNR  Family Communication: patient and wife  Disposition Plan: home in 1 to 2 days.    Consultants:  none  Procedures:  none  Antibiotics:  Rocephin/zithro  Oral vanc/flagyl   Objective: BP (!) 171/108 (BP Location: Right Arm)   Pulse (!) 105   Temp 98.1 F (36.7 C) (Oral)   Resp 20   Ht '5\' 10"'$  (1.778 m)   Wt 108.9 kg (240 lb)   SpO2 94%   BMI 34.44 kg/m   Intake/Output Summary (Last 24 hours) at 11/07/16 1801 Last data filed at 11/07/16 1500  Gross per 24 hour  Intake             3030 ml  Output             1225 ml  Net             1805 ml   Filed Weights   11/03/16 1855  Weight: 108.9 kg (240 lb)    Exam:   General:  NAD on 5 lit of [place] oxygen.   Cardiovascular: sinus tachycardia  Respiratory: diminished at bases no wheezing.  Abdomen: Soft/ND/NT, positive BS, g tube in place.   Musculoskeletal: No Edema  Neuro: aaox3  Data Reviewed: Basic Metabolic Panel:  Recent Labs Lab 11/03/16 1846 11/04/16 0107 11/05/16 0307 11/06/16 0307 11/07/16 0528  NA 133* 133* 137 140 142  K 5.4* 4.3 3.9 4.1 4.0  CL 95* 98* 105 107 112*  CO2 '26 25 26 25 24  '$ GLUCOSE 115* 179* 116* 100* 101*  BUN 40* 41* 33* 29* 22*  CREATININE 1.73* 1.71* 1.49* 1.39* 1.35*  CALCIUM 10.2  9.6 9.1 9.1 9.0   Liver Function Tests:  Recent Labs Lab 11/05/16 0307  AST 33  ALT 34  ALKPHOS 68  BILITOT 0.6  PROT 6.1*  ALBUMIN 2.7*   No results for input(s): LIPASE, AMYLASE in the last 168 hours. No results for input(s): AMMONIA in the last 168 hours. CBC:  Recent Labs Lab 11/03/16 1846 11/04/16 0326 11/05/16 0307 11/06/16 0307 11/07/16 0528  WBC 14.8* 13.3* 12.6* 7.4 6.7  NEUTROABS  --   --   --   --  4.7  HGB 11.0* 10.2* 10.0* 10.0* 10.1*  HCT 33.4* 31.1* 30.9* 31.2* 31.2*  MCV 109.2* 109.5* 110.4* 109.1* 110.6*  PLT 200 188 174 184 196   Cardiac Enzymes:    Recent Labs Lab 11/03/16 1846  TROPONINI <0.03   BNP (last 3 results) No results for input(s): BNP in the last 8760 hours.  ProBNP (last 3 results) No results for input(s): PROBNP in the last 8760 hours.  CBG:  Recent Labs Lab 11/03/16 1836  GLUCAP 116*    Recent Results (from the past 240 hour(s))  Urine culture     Status: None   Collection Time: 11/03/16  6:00 PM  Result Value Ref Range Status   Specimen Description URINE, CLEAN CATCH  Final   Special Requests NONE  Final   Culture NO GROWTH Performed at [facility]   Final   Report Status 11/05/2016 FINAL  Final  Culture, blood (Routine x 2)     Status: None (Preliminary result)   Collection Time: 11/03/16  6:49 PM  Result Value Ref Range Status   Specimen Description BLOOD LEFT ANTECUBITAL  Final   Special Requests BOTTLES DRAWN AEROBIC AND ANAEROBIC 5CC  Final   Culture   Final    NO GROWTH 4 DAYS Performed at [facility]    Report Status PENDING  Incomplete  Culture, blood (Routine x 2)     Status: None (Preliminary result)   Collection Time: 11/03/16  7:20 PM  Result Value Ref Range Status   Specimen Description BLOOD LEFT FOREARM  Final   Special Requests BOTTLES DRAWN AEROBIC AND ANAEROBIC 5ML  Final   Culture   Final    NO GROWTH 4 DAYS Performed at [facility]    Report Status PENDING   Incomplete  MRSA PCR Screening     Status: None   Collection Time: 11/04/16 12:53 AM  Result Value Ref Range Status   MRSA by PCR NEGATIVE NEGATIVE Final    Comment:        The GeneXpert MRSA Assay (FDA approved for NASAL specimens only), is one component of a comprehensive MRSA colonization surveillance program. It is not intended to diagnose MRSA infection nor to guide or monitor treatment for MRSA infections.   C difficile quick scan w PCR reflex  Status: Abnormal   Collection Time: 11/04/16  2:59 AM  Result Value Ref Range Status   C Diff antigen POSITIVE (A) NEGATIVE Final   C Diff toxin POSITIVE (A) NEGATIVE Final    Comment: CRITICAL RESULT CALLED TO, READ BACK BY AND VERIFIED WITH: M.STREBLE,RN [id number] 11/04/16 W.SHEA    C Diff interpretation Toxin producing C. difficile detected.  Final    Comment: CRITICAL RESULT CALLED TO, READ BACK BY AND VERIFIED WITH: M.STREBLE,RN [id number] 11/04/16 W.SHEA   Culture, sputum-assessment     Status: None   Collection Time: 11/04/16  6:34 PM  Result Value Ref Range Status   Specimen Description EXPECTORATED SPUTUM  Final   Special Requests NONE  Final   Sputum evaluation   Final    THIS SPECIMEN IS ACCEPTABLE. RESPIRATORY CULTURE REPORT TO FOLLOW.   Report Status 11/04/2016 FINAL  Final  Culture, respiratory (NON-Expectorated)     Status: None   Collection Time: 11/04/16  6:34 PM  Result Value Ref Range Status   Specimen Description SPUTUM  Final   Special Requests NONE  Final   Gram Stain   Final    MODERATE WBC PRESENT, PREDOMINANTLY PMN MODERATE GRAM POSITIVE COCCI IN PAIRS FEW GRAM NEGATIVE RODS FEW GRAM VARIABLE ROD RARE YEAST    Culture   Final    Consistent with normal respiratory flora. Performed at [facility]    Report Status 11/07/2016 FINAL  Final     Studies: No results found.  Scheduled Meds: . albuterol  2.5 mg Nebulization BID  . aspirin EC  81 mg Oral q morning - 10a  . azithromycin  500 mg  Oral QHS  . cefTRIAXone (ROCEPHIN)  IV  1 g Intravenous Q24H  . enoxaparin (LOVENOX) injection  40 mg Subcutaneous Daily  . ferrous sulfate  325 mg Oral Q breakfast  . guaiFENesin  600 mg Oral BID  . hydrochlorothiazide  12.5 mg Oral Daily  . lactobacillus  1 g Oral TID WC  . lisinopril  20 mg Oral Daily  . mouth rinse  15 mL Mouth Rinse BID  . metroNIDAZOLE  500 mg Oral Q8H  . multivitamin with minerals  1 tablet Oral Daily  . sertraline  50 mg Oral Daily  . tiotropium  18 mcg Inhalation Daily  . vancomycin  500 mg Oral Q6H    Continuous Infusions: . sodium chloride 100 mL/hr at 11/06/16 1814     Time spent: 30 minutes.   Naw Lasala MD. [facility] Pager [phone] If 7PM-7AM, please contact night-coverage at [email or web address], password [facility] 11/07/2016, 6:01 PM  LOS: 4 days

## 2016-11-08 ENCOUNTER — Inpatient Hospital Stay (HOSPITAL_COMMUNITY): Payer: Medicare Other

## 2016-11-08 LAB — CBC WITH DIFFERENTIAL/PLATELET
BASOS ABS: 0 10*3/uL (ref 0.0–0.1)
BASOS PCT: 0 %
EOS ABS: 0 10*3/uL (ref 0.0–0.7)
Eosinophils Relative: 0 %
HCT: 35.8 % — ABNORMAL LOW (ref 39.0–52.0)
HEMOGLOBIN: 11.3 g/dL — AB (ref 13.0–17.0)
LYMPHS PCT: 8 %
Lymphs Abs: 0.7 10*3/uL (ref 0.7–4.0)
MCH: 35.4 pg — AB (ref 26.0–34.0)
MCHC: 31.6 g/dL (ref 30.0–36.0)
MCV: 112.2 fL — ABNORMAL HIGH (ref 78.0–100.0)
MONO ABS: 1.3 10*3/uL — AB (ref 0.1–1.0)
Monocytes Relative: 16 %
NEUTROS ABS: 6.4 10*3/uL (ref 1.7–7.7)
Neutrophils Relative %: 76 %
PLATELETS: 201 10*3/uL (ref 150–400)
RBC: 3.19 MIL/uL — ABNORMAL LOW (ref 4.22–5.81)
RDW: 17.6 % — AB (ref 11.5–15.5)
WBC: 8.4 10*3/uL (ref 4.0–10.5)

## 2016-11-08 LAB — CULTURE, BLOOD (ROUTINE X 2)
Culture: NO GROWTH
Culture: NO GROWTH

## 2016-11-08 LAB — BASIC METABOLIC PANEL
Anion gap: 8 (ref 5–15)
Anion gap: 8 (ref 5–15)
BUN: 19 mg/dL (ref 6–20)
BUN: 23 mg/dL — ABNORMAL HIGH (ref 6–20)
CALCIUM: 9.6 mg/dL (ref 8.9–10.3)
CHLORIDE: 112 mmol/L — AB (ref 101–111)
CHLORIDE: 109 mmol/L (ref 101–111)
CO2: 23 mmol/L (ref 22–32)
CO2: 24 mmol/L (ref 22–32)
CREATININE: 1.43 mg/dL — AB (ref 0.61–1.24)
CREATININE: 1.47 mg/dL — AB (ref 0.61–1.24)
Calcium: 9.7 mg/dL (ref 8.9–10.3)
GFR calc non Af Amer: 43 mL/min — ABNORMAL LOW (ref >60)
GFR, EST AFRICAN AMERICAN: 52 mL/min — AB (ref >60)
GFR, EST AFRICAN AMERICAN: 50 mL/min — AB (ref >60)
GFR, EST NON AFRICAN AMERICAN: 45 mL/min — AB (ref >60)
Glucose, Bld: 140 mg/dL — ABNORMAL HIGH (ref 65–99)
Glucose, Bld: 125 mg/dL — ABNORMAL HIGH (ref 65–99)
POTASSIUM: 4.3 mmol/L (ref 3.5–5.1)
Potassium: 4.5 mmol/L (ref 3.5–5.1)
SODIUM: 143 mmol/L (ref 135–145)
SODIUM: 141 mmol/L (ref 135–145)

## 2016-11-08 LAB — MAGNESIUM: MAGNESIUM: 1.4 mg/dL — AB (ref 1.7–2.4)

## 2016-11-08 LAB — TROPONIN I: TROPONIN I: 0.18 ng/mL — AB (ref <0.03)

## 2016-11-08 MED ORDER — METOPROLOL TARTRATE 5 MG/5ML IV SOLN
INTRAVENOUS | Status: AC
  Filled 2016-11-08: qty 5

## 2016-11-08 MED ORDER — LEVALBUTEROL HCL 1.25 MG/0.5ML IN NEBU
1.2500 mg | INHALATION_SOLUTION | Freq: Once | RESPIRATORY_TRACT | Status: AC
  Administered 2016-11-08: 1.25 mg via RESPIRATORY_TRACT
  Filled 2016-11-08: qty 0.5

## 2016-11-08 MED ORDER — METOPROLOL TARTRATE 5 MG/5ML IV SOLN
5.0000 mg | Freq: Once | INTRAVENOUS | Status: AC
  Administered 2016-11-08: 5 mg via INTRAVENOUS

## 2016-11-08 MED ORDER — METOPROLOL TARTRATE 5 MG/5ML IV SOLN
5.0000 mg | Freq: Four times a day (QID) | INTRAVENOUS | Status: DC | PRN
  Filled 2016-11-08 (×2): qty 5

## 2016-11-08 MED ORDER — FUROSEMIDE 10 MG/ML IJ SOLN
40.0000 mg | Freq: Once | INTRAMUSCULAR | Status: AC
  Administered 2016-11-08: 40 mg via INTRAVENOUS
  Filled 2016-11-08: qty 4

## 2016-11-08 MED ORDER — FUROSEMIDE 10 MG/ML IJ SOLN
60.0000 mg | Freq: Once | INTRAMUSCULAR | Status: AC
  Administered 2016-11-08: 60 mg via INTRAVENOUS
  Filled 2016-11-08: qty 6

## 2016-11-08 NOTE — Progress Notes (Signed)
Pt had a 2 min run of SVT with HR in 170's. Pt denies SOB, chest pain, dizziness, palpitations.  Pt laying in bed watching TV. MD notified. Will continues to monitor.

## 2016-11-08 NOTE — Progress Notes (Signed)
PROGRESS NOTE  David Barton WNU:[id number] DOB: 1933/12/01 DOA: 11/03/2016 PCP: Vena Austria, MD   Brief history: David Barton is a 82 y.o. male with medical history significant of Colon cancer,COPD, HTN who presents complaining of generalized weakness,  Was found to be have c diff diarrhea, and community acquired pneumonia.   Around 5 pm on 11/20 pt developed runs of non sustained SVT, he was given a dose of metoprolol and rate is better now is 80's.   HPI/Recap of past 24 hours:  No new complaints,  Reports breathing better.   Assessment/Plan: Active Problems:   COPD  GOLD II with reversibility   Essential hypertension   CAP (community acquired pneumonia)   PNA (pneumonia)   Diarrhea   AKI (acute kidney injury) ([place])   Hyperkalemia  Severe sepsis presented on admission with fever, leukocytosis, sinus tachycardia, hypoxia.  Secondary to aspiration pneumonia and C diff diarrhea. He was started on IV antibiotics and currently on [place] oxygen to keep sats greater than 90%. Blood cultures done on admission and negative so far. Sputum cultures are done and pending. No fever overnight,  leukocytosis resolved.  Lactic acid normalized. Weaned him down to 3 lit of [place] oxygen.     Acute hypoxic respiratory failure:  From sepsis and pna, currently on 3 liter, wean as tolerated. rpeat CXR in am shows pulmonary edema and he was given lasix .   will continue to monitor.   Cdiff: With recent abx exposure for uti treatment by pmd, due to disease severity, he is started on high dose oral vanc in addition to oral flagyl, will taper once diarrhea slows down and clinically more stable.currently has rectal tube, diarrhea seems to be improving.   PNA: concerning for aspiration pneumonia CT :"RIGHT lower lobe consolidation concerning for pneumonia, possibly secondary to aspiration. Diffuse mild bronchial wall thickening can be seen with reactive airway dose disease or bronchitis.  Secretions in the trachea and bronchi consistent with aspiration" Blood culture/sputum culture in process, urine legionella antigen negative, urine strep pneumo antigen negative Continue rocephin/zithro/flagyl that was started on admission  Dysphagia: swallow eval, DG esophagus ordered showed esophageal dysmotility. Recommend GI consult on discharge or when his pneumonia and his sepsis improves.    AKI : likely from dehydration and sepsis, ua no infection. Continue ivf, hold home meds lisinopril /HCTZ. Improving with hydration.  Renal parameters improving,.    COPD: no wheezing, not oxygen dependent at baseline, prior smoker.  Resume [place] ozygen to keep sats greater than 90%.   Daily alcohol use, denies h/o alcohol withdrawal problems, will monitor  Progressive weakness, will need PT eval, may need to have spine imaging piror to discharge or to be scheduled by pmd, depends on patient's clinical condition, I have discussed this with patient and his wife, they expressed understanding.  Hypertension:  Accelerated, resume home homes and added hydralazine prn.   ? Runs of SVT; One dose of metoprolol 5 mg given and rate is better.  troponins and echocardiogram ordered.   Code Status: DNR  Family Communication: patient and wife  Disposition Plan: home in 1 to 2 days.    Consultants:  none  Procedures:  none  Antibiotics:  Rocephin/zithro  Oral vanc/flagyl   Objective: BP 134/85 (BP Location: Right Arm)   Pulse (!) 106   Temp 98.2 F (36.8 C) (Oral)   Resp 20   Ht '5\' 10"'$  (1.778 m)   Wt 108.9 kg (240 lb)   SpO2 93%  BMI 34.44 kg/m   Intake/Output Summary (Last 24 hours) at 11/08/16 1756 Last data filed at 11/08/16 1146  Gross per 24 hour  Intake             1390 ml  Output             1100 ml  Net              290 ml   Filed Weights   11/03/16 1855  Weight: 108.9 kg (240 lb)    Exam:   General:  NAD on 3 lit of  oxygen.   Cardiovascular: RRR, no  murmers   Respiratory: diminished at bases no wheezing.  Abdomen: Soft/ND/NT, positive BS, g tube in place.   Musculoskeletal: No Edema  Neuro: aaox3  Data Reviewed: Basic Metabolic Panel:  Recent Labs Lab 11/04/16 0107 11/05/16 0307 11/06/16 0307 11/07/16 0528 11/08/16 0513  NA 133* 137 140 142 143  K 4.3 3.9 4.1 4.0 4.5  CL 98* 105 107 112* 112*  CO2 '25 26 25 24 23  '$ GLUCOSE 179* 116* 100* 101* 140*  BUN 41* 33* 29* 22* 19  CREATININE 1.71* 1.49* 1.39* 1.35* 1.43*  CALCIUM 9.6 9.1 9.1 9.0 9.6   Liver Function Tests:  Recent Labs Lab 11/05/16 0307  AST 33  ALT 34  ALKPHOS 68  BILITOT 0.6  PROT 6.1*  ALBUMIN 2.7*   No results for input(s): LIPASE, AMYLASE in the last 168 hours. No results for input(s): AMMONIA in the last 168 hours. CBC:  Recent Labs Lab 11/04/16 0326 11/05/16 0307 11/06/16 0307 11/07/16 0528 11/08/16 0513  WBC 13.3* 12.6* 7.4 6.7 8.4  NEUTROABS  --   --   --  4.7 6.4  HGB 10.2* 10.0* 10.0* 10.1* 11.3*  HCT 31.1* 30.9* 31.2* 31.2* 35.8*  MCV 109.5* 110.4* 109.1* 110.6* 112.2*  PLT 188 174 184 196 201   Cardiac Enzymes:    Recent Labs Lab 11/03/16 1846  TROPONINI <0.03   BNP (last 3 results) No results for input(s): BNP in the last 8760 hours.  ProBNP (last 3 results) No results for input(s): PROBNP in the last 8760 hours.  CBG:  Recent Labs Lab 11/03/16 1836  GLUCAP 116*    Recent Results (from the past 240 hour(s))  Urine culture     Status: None   Collection Time: 11/03/16  6:00 PM  Result Value Ref Range Status   Specimen Description URINE, CLEAN CATCH  Final   Special Requests NONE  Final   Culture NO GROWTH Performed at [facility]   Final   Report Status 11/05/2016 FINAL  Final  Culture, blood (Routine x 2)     Status: None   Collection Time: 11/03/16  6:49 PM  Result Value Ref Range Status   Specimen Description BLOOD LEFT ANTECUBITAL  Final   Special Requests BOTTLES DRAWN AEROBIC AND  ANAEROBIC 5CC  Final   Culture   Final    NO GROWTH 5 DAYS Performed at [facility]    Report Status 11/08/2016 FINAL  Final  Culture, blood (Routine x 2)     Status: None   Collection Time: 11/03/16  7:20 PM  Result Value Ref Range Status   Specimen Description BLOOD LEFT FOREARM  Final   Special Requests BOTTLES DRAWN AEROBIC AND ANAEROBIC 5ML  Final   Culture   Final    NO GROWTH 5 DAYS Performed at [facility]    Report Status 11/08/2016 FINAL  Final  MRSA PCR Screening     Status: None   Collection Time: 11/04/16 12:53 AM  Result Value Ref Range Status   MRSA by PCR NEGATIVE NEGATIVE Final    Comment:        The GeneXpert MRSA Assay (FDA approved for NASAL specimens only), is one component of a comprehensive MRSA colonization surveillance program. It is not intended to diagnose MRSA infection nor to guide or monitor treatment for MRSA infections.   C difficile quick scan w PCR reflex     Status: Abnormal   Collection Time: 11/04/16  2:59 AM  Result Value Ref Range Status   C Diff antigen POSITIVE (A) NEGATIVE Final   C Diff toxin POSITIVE (A) NEGATIVE Final    Comment: CRITICAL RESULT CALLED TO, READ BACK BY AND VERIFIED WITH: M.STREBLE,RN [id number] 11/04/16 W.SHEA    C Diff interpretation Toxin producing C. difficile detected.  Final    Comment: CRITICAL RESULT CALLED TO, READ BACK BY AND VERIFIED WITH: M.STREBLE,RN 0433 11/04/16 W.SHEA   Culture, sputum-assessment     Status: None   Collection Time: 11/04/16  6:34 PM  Result Value Ref Range Status   Specimen Description EXPECTORATED SPUTUM  Final   Special Requests NONE  Final   Sputum evaluation   Final    THIS SPECIMEN IS ACCEPTABLE. RESPIRATORY CULTURE REPORT TO FOLLOW.   Report Status 11/04/2016 FINAL  Final  Culture, respiratory (NON-Expectorated)     Status: None   Collection Time: 11/04/16  6:34 PM  Result Value Ref Range Status   Specimen Description SPUTUM  Final   Special Requests  NONE  Final   Gram Stain   Final    MODERATE WBC PRESENT, PREDOMINANTLY PMN MODERATE GRAM POSITIVE COCCI IN PAIRS FEW GRAM NEGATIVE RODS FEW GRAM VARIABLE ROD RARE YEAST    Culture   Final    Consistent with normal respiratory flora. Performed at [facility]    Report Status 11/07/2016 FINAL  Final     Studies: Dg Chest 2 View  Result Date: 11/08/2016 CLINICAL DATA:  Low O2 sats, shortness of breath. EXAM: CHEST  2 VIEW COMPARISON:  11/08/2016 and CT chest 11/03/2016. FINDINGS: Trachea is midline. Heart size stable. Thoracic aorta is calcified. Mid and lower lung zone interstitial prominence and peripheral septal lines. Small bilateral pleural effusions. IMPRESSION: Findings suspicious for mild edema and small bilateral pleural effusions. Electronically Signed   By: Lorin Picket M.D.   On: 11/08/2016 09:09   Dg Chest Port 1 View  Result Date: 11/08/2016 CLINICAL DATA:  Acute onset of shortness of breath. Initial encounter. EXAM: PORTABLE CHEST 1 VIEW COMPARISON:  Chest radiograph and CTA of the chest performed 11/03/2016 FINDINGS: The lungs are well-aerated. Vascular congestion is noted. Bilateral central and bibasilar airspace opacities raise concern for pulmonary edema, though pneumonia could have a similar appearance. Small bilateral pleural effusions are suspected. No pneumothorax is seen. The cardiomediastinal silhouette is within normal limits. No acute osseous abnormalities are seen. IMPRESSION: Vascular congestion noted. Bilateral central and bibasilar airspace opacities raise concern for pulmonary edema, though pneumonia could have a similar appearance. Small bilateral pleural effusions. Electronically Signed   By: Garald Balding M.D.   On: 11/08/2016 01:10    Scheduled Meds: . aspirin EC  81 mg Oral q morning - 10a  . azithromycin  500 mg Oral QHS  . cefTRIAXone (ROCEPHIN)  IV  1 g Intravenous Q24H  . enoxaparin (LOVENOX) injection  40 mg Subcutaneous Daily  .  ferrous sulfate  325 mg Oral Q breakfast  . guaiFENesin  600 mg Oral BID  . hydrochlorothiazide  12.5 mg Oral Daily  . lactobacillus  1 g Oral TID WC  . lisinopril  20 mg Oral Daily  . mouth rinse  15 mL Mouth Rinse BID  . metroNIDAZOLE  500 mg Oral Q8H  . multivitamin with minerals  1 tablet Oral Daily  . sertraline  50 mg Oral Daily  . tiotropium  18 mcg Inhalation Daily  . vancomycin  500 mg Oral Q6H    Continuous Infusions:    Time spent: 30 minutes.   Audel Coakley MD. [facility] Pager [phone] If 7PM-7AM, please contact night-coverage at [email or web address], password [facility] 11/08/2016, 5:56 PM  LOS: 5 days

## 2016-11-08 NOTE — Care Management Note (Signed)
Case Management Note  Patient Details  Name: David Barton MRN: [id number] Date of Birth: 1933/10/11  Subjective/Objective: 82 y/o m admitted w/CAP. From home w/spouse.Has cane @ home. PT-recc SNF-patient declined. Provided w/HHC agency list-used [facility] in past-chose [facility]-rep [name] aware of referral. Recc HHPT/OT-patient declines [facility] nursing.Will ask PT to eval for DME.Noted on 02-may need @ d/c if qualifies,& ordered-await order. Await HHPT/OT order,[id number] rectal tube.                   Action/Plan:d/c plan home w/HHC.   Expected Discharge Date:   (unknown)               [place] Dis[place] Plan:  [place] Referral:  Clinical Social Work  Discharge planning Services  CM Consult  Post Acute Care Choice:    Choice offered to:  Patient  DME Arranged:    DME Agency:     [facility] [place]    [place] Status of Service:  In process, will continue to follow  If discussed at Long Length of Stay Meetings, dates discussed:    Additional Comments:  MDessa Phi RN 11/08/2016, 12:34 PM

## 2016-11-08 NOTE — Progress Notes (Signed)
CSW reviewed PT evaluation recommending SNF vs HH. CSW spoke with patient re: discharge planning - patient declined SNF, stating that he would prefer to return home with home health, had used [facility] in the past. RNCM, Juliann Pulse made aware.   No further CSW needs identified - CSW signing off.   Raynaldo Opitz, [facility] Clinical Social Worker cell #: [phone]

## 2016-11-08 NOTE — Progress Notes (Signed)
CRITICAL VALUE ALERT  Critical value received:  Troponin 0.18  Date of notification:  11/08/16  Time of notification:  1920  Critical value read back:Yes.    Nurse who received alert:  Star Age  MD notified (1st page):  Alanda Slim, NP  Time of first page:  1925  MD notified (2nd page):  Time of second page:  Responding MD: Lamar Blinks, NP  Time MD responded:

## 2016-11-08 NOTE — Progress Notes (Signed)
Pt had a 66mnute run of SVT. Pt denies SOB, chest pain, Dizziness, palpations. MD notified. Will continue to monitor.

## 2016-11-09 ENCOUNTER — Inpatient Hospital Stay (HOSPITAL_COMMUNITY): Payer: Medicare Other

## 2016-11-09 ENCOUNTER — Encounter (HOSPITAL_COMMUNITY): Payer: Self-pay | Admitting: Physician Assistant

## 2016-11-09 DIAGNOSIS — R748 Abnormal levels of other serum enzymes: Secondary | ICD-10-CM

## 2016-11-09 DIAGNOSIS — I10 Essential (primary) hypertension: Secondary | ICD-10-CM

## 2016-11-09 DIAGNOSIS — J449 Chronic obstructive pulmonary disease, unspecified: Secondary | ICD-10-CM

## 2016-11-09 DIAGNOSIS — R079 Chest pain, unspecified: Secondary | ICD-10-CM

## 2016-11-09 LAB — CBC WITH DIFFERENTIAL/PLATELET
Basophils Absolute: 0 10*3/uL (ref 0.0–0.1)
Basophils Relative: 0 %
EOS ABS: 0.3 10*3/uL (ref 0.0–0.7)
EOS PCT: 5 %
HCT: 35.2 % — ABNORMAL LOW (ref 39.0–52.0)
Hemoglobin: 11.4 g/dL — ABNORMAL LOW (ref 13.0–17.0)
LYMPHS ABS: 0.9 10*3/uL (ref 0.7–4.0)
Lymphocytes Relative: 13 %
MCH: 35.3 pg — AB (ref 26.0–34.0)
MCHC: 32.4 g/dL (ref 30.0–36.0)
MCV: 109 fL — ABNORMAL HIGH (ref 78.0–100.0)
Monocytes Absolute: 1.1 10*3/uL — ABNORMAL HIGH (ref 0.1–1.0)
Monocytes Relative: 16 %
Neutro Abs: 4.6 10*3/uL (ref 1.7–7.7)
Neutrophils Relative %: 66 %
PLATELETS: 198 10*3/uL (ref 150–400)
RBC: 3.23 MIL/uL — AB (ref 4.22–5.81)
RDW: 16.9 % — ABNORMAL HIGH (ref 11.5–15.5)
WBC: 6.8 10*3/uL (ref 4.0–10.5)

## 2016-11-09 LAB — BASIC METABOLIC PANEL
Anion gap: 9 (ref 5–15)
BUN: 24 mg/dL — AB (ref 6–20)
CALCIUM: 9.6 mg/dL (ref 8.9–10.3)
CO2: 23 mmol/L (ref 22–32)
CREATININE: 1.51 mg/dL — AB (ref 0.61–1.24)
Chloride: 108 mmol/L (ref 101–111)
GFR calc Af Amer: 49 mL/min — ABNORMAL LOW (ref >60)
GFR, EST NON AFRICAN AMERICAN: 42 mL/min — AB (ref >60)
Glucose, Bld: 112 mg/dL — ABNORMAL HIGH (ref 65–99)
POTASSIUM: 3.8 mmol/L (ref 3.5–5.1)
SODIUM: 140 mmol/L (ref 135–145)

## 2016-11-09 LAB — ECHOCARDIOGRAM COMPLETE
HEIGHTINCHES: 70 in
Weight: 3840 oz

## 2016-11-09 LAB — TROPONIN I
TROPONIN I: 0.14 ng/mL — AB (ref <0.03)
Troponin I: 0.11 ng/mL (ref <0.03)

## 2016-11-09 LAB — TSH: TSH: 5.734 u[IU]/mL — ABNORMAL HIGH (ref 0.350–4.500)

## 2016-11-09 LAB — T4, FREE: Free T4: 0.9 ng/dL (ref 0.61–1.12)

## 2016-11-09 MED ORDER — BACID PO TABS
2.0000 | ORAL_TABLET | Freq: Three times a day (TID) | ORAL | Status: DC
  Filled 2016-11-09: qty 2

## 2016-11-09 MED ORDER — DILTIAZEM HCL 60 MG PO TABS
60.0000 mg | ORAL_TABLET | Freq: Four times a day (QID) | ORAL | Status: DC
  Administered 2016-11-09 – 2016-11-11 (×8): 60 mg via ORAL
  Filled 2016-11-09 (×8): qty 1

## 2016-11-09 MED ORDER — PERFLUTREN LIPID MICROSPHERE
1.0000 mL | INTRAVENOUS | Status: AC | PRN
  Administered 2016-11-09: 2 mL via INTRAVENOUS
  Filled 2016-11-09: qty 10

## 2016-11-09 MED ORDER — PERFLUTREN LIPID MICROSPHERE
INTRAVENOUS | Status: AC
  Filled 2016-11-09: qty 10

## 2016-11-09 MED ORDER — LACTINEX PO CHEW
2.0000 | CHEWABLE_TABLET | Freq: Three times a day (TID) | ORAL | Status: DC
  Administered 2016-11-09: 2 via ORAL
  Filled 2016-11-09 (×4): qty 2

## 2016-11-09 MED ORDER — FUROSEMIDE 40 MG PO TABS
40.0000 mg | ORAL_TABLET | Freq: Once | ORAL | Status: AC
  Administered 2016-11-09: 40 mg via ORAL
  Filled 2016-11-09: qty 1

## 2016-11-09 MED ORDER — VITAMIN B-1 100 MG PO TABS
100.0000 mg | ORAL_TABLET | Freq: Every day | ORAL | Status: DC
  Administered 2016-11-09 – 2016-11-20 (×12): 100 mg via ORAL
  Filled 2016-11-09 (×12): qty 1

## 2016-11-09 MED ORDER — MAGNESIUM SULFATE 50 % IJ SOLN
3.0000 g | Freq: Once | INTRAVENOUS | Status: AC
  Administered 2016-11-09: 3 g via INTRAVENOUS
  Filled 2016-11-09: qty 6

## 2016-11-09 MED ORDER — FUROSEMIDE 10 MG/ML IJ SOLN: 40.0000 mg | Freq: Once | INTRAMUSCULAR | Status: DC

## 2016-11-09 NOTE — Consult Note (Signed)
Cardiology Consultation Note    Patient ID: David Barton, MRN: [id number], DOB/AGE: [birth year] 82 y.o. Admit date: 11/03/2016   Date of Consult: 11/09/2016 Primary Physician: Vena Austria, MD Primary Cardiologist: New to Dr. Gwenlyn Found  Chief Complaint: weak legs Reason for Consultation: elevated troponin, SVT Requesting MD: Dr. Karleen Hampshire  HPI: David Barton is a 82 y.o. male with history of COPD, colon CA, alcohol abuse, former tobacco abuse,  HTN, obesity, DNR presented to [facility] with generalized weakness, unable to ambulate, cough, and mild diarrhea - found to acute hpoxic respiratory failure, sepsis due to aspiration PNA, AKI with Cr 1.73, hyperkalemia 5.4, leukocytosis 14.8, mild macroytic anemia, and C diff colitis.   His workup thus far has shown initially neg troponin then 0.18-0.14-0.11. CXR showing left base atx, poss pulm HTN -> subsequent CTA showed no acute PE, + mod emphysema, no pulm nodule, + RLL consolidation concerning for PNA possibly 2/2 aspiration. Esophagram showed esophageal dysmotility. F/u CXR raising concern for pulm edema and small bilateral effusions. 2D echo pending. Cardiology asked to see for elevated troponin as well as runs of SVT up to 170-180bpm. The patient reports improved SOB since admission but still gets hypoxic when [place] is removed. He denies any chest pain. He denies any awareness of palpitations. No personal or family history of CAD. He reports drinking 3-4x a week, "a couple" on those occasions, does not elaborate further. Getting ready to ambulate with PT. He was treated with '60mg'$  + '40mg'$  IV Lasix yesterday, +5.8L but strict I/O not ordered yet, has not had weight since 11/15.  Past Medical History:  Diagnosis Date  . Alcohol abuse   . Colon cancer ([place])   . COPD (chronic obstructive pulmonary disease) ([place])   . Emphysema of lung ([place])   . Former tobacco use   . Hypertension   . Peripheral edema       Surgical History:  Past Surgical  History:  Procedure Laterality Date  . CATARACT EXTRACTION    . COLON RESECTION    . COLONOSCOPY    . MINOR HEMORRHOIDECTOMY  1995  . TONSILLECTOMY       Home Meds: Prior to Admission medications   Medication Sig Start Date End Date Taking? Authorizing Provider  aspirin EC 81 MG tablet Take 81 mg by mouth every morning.   Yes Historical Provider, MD  ferrous sulfate 325 (65 FE) MG tablet Take 325 mg by mouth daily with breakfast.   Yes Historical Provider, MD  lisinopril-hydrochlorothiazide (PRINZIDE,ZESTORETIC) 20-12.5 MG tablet Take 1 tablet by mouth daily. 10/29/16  Yes Historical Provider, MD  Multiple Vitamin (MULTIVITAMIN WITH MINERALS) TABS tablet Take 1 tablet by mouth daily.   Yes Historical Provider, MD  sertraline (ZOLOFT) 50 MG tablet Take 50 mg by mouth daily.   Yes Historical Provider, MD  tiotropium (SPIRIVA) 18 MCG inhalation capsule Place 1 capsule (18 mcg total) into inhaler and inhale daily. 05/10/13  Yes Monika Salk, MD    Inpatient Medications:  . aspirin EC  81 mg Oral q morning - 10a  . azithromycin  500 mg Oral QHS  . cefTRIAXone (ROCEPHIN)  IV  1 g Intravenous Q24H  . enoxaparin (LOVENOX) injection  40 mg Subcutaneous Daily  . ferrous sulfate  325 mg Oral Q breakfast  . guaiFENesin  600 mg Oral BID  . hydrochlorothiazide  12.5 mg Oral Daily  . lactobacillus  1 g Oral TID WC  . lisinopril  20 mg Oral Daily  .  mouth rinse  15 mL Mouth Rinse BID  . metroNIDAZOLE  500 mg Oral Q8H  . multivitamin with minerals  1 tablet Oral Daily  . sertraline  50 mg Oral Daily  . tiotropium  18 mcg Inhalation Daily  . vancomycin  500 mg Oral Q6H     Allergies:  Allergies  Allergen Reactions  . Flexeril [Cyclobenzaprine] Other (See Comments)    Unknown.   . Norvasc [Amlodipine Besylate] Other (See Comments)    Unknown.     Social History   Social History  . Marital status: Married    Spouse name: N/A  . Number of children: 3  . Years of education: N/A    Occupational History  . Retired- Associate Professor    Social History Main Topics  . Smoking status: Former Smoker    Packs/day: 1.00    Years: 20.00    Types: Cigarettes    Quit date: 12/20/1981  . Smokeless tobacco: Never Used  . Alcohol use 3.5 oz/week    7 Standard drinks or equivalent per week     Comment: states he drinks 3-4 days a week, "a couple" on those occasions but does not further quaniity.  . Drug use: No  . Sexual activity: No   Other Topics Concern  . Not on file   Social History Narrative  . No narrative on file     Family History  Problem Relation Age of Onset  . Brain cancer Father   . Colon cancer Mother     with mets to liver  . Lung cancer Brother     was a smoker     Review of Systems: as above, denies weight changes, syncope, palpitations All other systems reviewed and are otherwise negative except as noted above.  Labs:  Recent Labs  11/08/16 1821 11/09/16 0008 11/09/16 0620  TROPONINI 0.18* 0.14* 0.11*   Lab Results  Component Value Date   WBC 6.8 11/09/2016   HGB 11.4 (L) 11/09/2016   HCT 35.2 (L) 11/09/2016   MCV 109.0 (H) 11/09/2016   PLT 198 11/09/2016    Recent Labs Lab 11/05/16 0307  11/09/16 0620  NA 137  < > 140  K 3.9  < > 3.8  CL 105  < > 108  CO2 26  < > 23  BUN 33*  < > 24*  CREATININE 1.49*  < > 1.51*  CALCIUM 9.1  < > 9.6  PROT 6.1*  --   --   BILITOT 0.6  --   --   ALKPHOS 68  --   --   ALT 34  --   --   AST 33  --   --   GLUCOSE 116*  < > 112*  < > = values in this interval not displayed. No results found for: CHOL, HDL, LDLCALC, TRIG Lab Results  Component Value Date   DDIMER 1.04 (H) 05/08/2013    Radiology/Studies:  Dg Chest 2 View  Result Date: 11/08/2016 CLINICAL DATA:  82 y/o M; worsening shortness of breath since this morning. Elevated troponin. EXAM: CHEST  2 VIEW COMPARISON:  11/08/2016 chest radiograph. FINDINGS: Increasing distortion markings and linear performed lines  compatible interstitial pulmonary edema. Small bilateral pleural effusions. Stable cardiac silhouette given projection and technique. Aortic atherosclerosis with arch calcification. Degenerative changes of the thoracic spine. IMPRESSION: Interstitial pulmonary edema and small bilateral effusions grossly unchanged from prior radiographs. Electronically Signed   By: Kristine Garbe M.D.   On: 11/08/2016  22:15   Dg Chest 2 View  Result Date: 11/08/2016 CLINICAL DATA:  Low O2 sats, shortness of breath. EXAM: CHEST  2 VIEW COMPARISON:  11/08/2016 and CT chest 11/03/2016. FINDINGS: Trachea is midline. Heart size stable. Thoracic aorta is calcified. Mid and lower lung zone interstitial prominence and peripheral septal lines. Small bilateral pleural effusions. IMPRESSION: Findings suspicious for mild edema and small bilateral pleural effusions. Electronically Signed   By: Lorin Picket M.D.   On: 11/08/2016 09:09   Dg Chest 2 View  Result Date: 11/03/2016 CLINICAL DATA:  Shortness of breath, diarrhea, fatigue. History of COPD, colon cancer. EXAM: CHEST  2 VIEW COMPARISON:  Chest radiograph July 06, 2013 FINDINGS: Cardiomediastinal silhouette is upper limits of normal in size. Mildly calcified aorta. No pleural effusion or focal consolidation. No pneumothorax. Strandy densities LEFT lung base. Small nodular density projecting RIGHT lung base. Old RIGHT anterior rib fractures. Severe RIGHT glenohumeral osteoarthrosis. Moderate degenerative change of thoracic spine. IMPRESSION: Borderline cardiomegaly.  LEFT lung base atelectasis. **An incidental finding of potential clinical significance has been found. Small nodular density RIGHT lung base. Recommend CT chest at 6-12 months.** Electronically Signed   By: Elon Alas M.D.   On: 11/03/2016 20:16   Ct Angio Chest Pe W And/or Wo Contrast  Result Date: 11/03/2016 CLINICAL DATA:  Shortness of breath, fatigue. History of colon cancer, emphysema and  COPD. EXAM: CT ANGIOGRAPHY CHEST WITH CONTRAST TECHNIQUE: Multidetector CT imaging of the chest was performed using the standard protocol during bolus administration of intravenous contrast. Multiplanar CT image reconstructions and MIPs were obtained to evaluate the vascular anatomy. CONTRAST:  80 cc Isovue 370 COMPARISON:  Chest radiograph November 03, 2016 at 1957 hours and CT chest May 16, 2013 FINDINGS: CARDIOVASCULAR: Adequate contrast opacification of the pulmonary artery's. Main pulmonary artery is not enlarged. No pulmonary arterial filling defects to the level of the subsegmental branches. Heart size is normal, no right heart strain. Mild coronary artery calcifications. No pericardial effusions. Thoracic aorta is normal course and caliber, mild calcific atherosclerosis. MEDIASTINUM/NODES: No lymphadenopathy by CT size criteria. Small RIGHT hilar lymph nodes without lymphadenopathy by CT size criteria. LUNGS/PLEURA: Mild bronchial wall thickening. Minimal secretions in the trachea mucous plugging RIGHT lower lobe bronchi. Diffuse mild bronchial wall thickening. Hypo enhancing RIGHT lower lobe consolidation with air bronchograms. Moderate centrilobular emphysema. No pleural effusion. No pulmonary nodule or mass. LEFT lung base atelectasis. UPPER ABDOMEN: Included view of the abdomen is nonacute. Adrenal glands not included. Hepatic steatosis. MUSCULOSKELETAL: Visualized soft tissues and included osseous structures are nonacute. Severe RIGHT glenohumeral osteoarthrosis with undersurface spurring. Old RIGHT antral lateral rib fractures. Review of the MIP images confirms the above findings. IMPRESSION: No acute pulmonary embolism. RIGHT lower lobe consolidation concerning for pneumonia, possibly secondary to aspiration. Diffuse mild bronchial wall thickening can be seen with reactive airway dose disease or bronchitis. Secretions in the trachea and bronchi consistent with aspiration. Moderate emphysema. No  pulmonary nodule, radiographic findings could be artifact or, secondary to RIGHT lung base consolidation. Electronically Signed   By: Elon Alas M.D.   On: 11/03/2016 23:15   Dg Esophagus  Result Date: 11/05/2016 CLINICAL DATA:  Dysphagia. EXAM: ESOPHOGRAM/BARIUM SWALLOW TECHNIQUE: Single contrast examination was performed using  thin barium. FLUOROSCOPY TIME:  Fluoroscopy Time:  1 minutes 30 seconds Radiation Exposure Index (if provided by the fluoroscopic device): Number of Acquired Spot Images: 0 COMPARISON:  None. FINDINGS: Moderate esophageal dysmotility. No stricture or mass. Barium passes readily into the stomach.  No hiatal hernia. Stomach grossly normal. The patient swallowed barium while supine for the study. IMPRESSION: Esophageal dysmotility.  Negative for stricture or mass.  No hernia. Electronically Signed   By: Franchot Gallo M.D.   On: 11/05/2016 09:30   Dg Chest Port 1 View  Result Date: 11/08/2016 CLINICAL DATA:  Acute onset of shortness of breath. Initial encounter. EXAM: PORTABLE CHEST 1 VIEW COMPARISON:  Chest radiograph and CTA of the chest performed 11/03/2016 FINDINGS: The lungs are well-aerated. Vascular congestion is noted. Bilateral central and bibasilar airspace opacities raise concern for pulmonary edema, though pneumonia could have a similar appearance. Small bilateral pleural effusions are suspected. No pneumothorax is seen. The cardiomediastinal silhouette is within normal limits. No acute osseous abnormalities are seen. IMPRESSION: Vascular congestion noted. Bilateral central and bibasilar airspace opacities raise concern for pulmonary edema, though pneumonia could have a similar appearance. Small bilateral pleural effusions. Electronically Signed   By: Garald Balding M.D.   On: 11/08/2016 01:10    Wt Readings from Last 3 Encounters:  11/03/16 240 lb (108.9 kg)  07/16/13 222 lb (100.7 kg)  05/29/13 212 lb 3.2 oz (96.3 kg)   EKG: sinus tach no acute ST-T  changes  Physical Exam: Blood pressure (!) 151/96, pulse (!) 118, temperature 99.1 F (37.3 C), temperature source Oral, resp. rate 20, height '5\' 10"'$  (1.778 m), weight 240 lb (108.9 kg), SpO2 92 %. Body mass index is 34.44 kg/m. General: Well developed, well nourished obese WM, in no acute distress. Head: Normocephalic, atraumatic, sclera non-icteric, no xanthomas, nares are without discharge.  Neck: Negative for carotid bruits. JVD not elevated. Lungs: Diffusely diminished without wheezes, rales, or rhonchi. Breathing is unlabored. Heart: Reg rhythm, tachy at baseline, with S1 S2. No murmurs, rubs, or gallops appreciated. Abdomen: Soft, non-tender, non-distended with normoactive bowel sounds. No hepatomegaly. No rebound/guarding. No obvious abdominal masses. Msk:  Strength and tone appear normal for age. Extremities: No clubbing or cyanosis. No edema.  Distal pedal pulses are 2+ and equal bilaterally. Neuro: Alert and oriented X 3. No facial asymmetry. No focal deficit. Moves all extremities spontaneously. Psych:  Responds to questions appropriately with a normal affect.     Assessment and Plan   82 y.o. male with history of COPD, colon CA, alcohol abuse, former tobacco abuse,  HTN, obesity, DNR presented to [facility] with generalized weakness, unable to ambulate, cough, and mild diarrhea - found to acute hpoxic respiratory failure, sepsis due to aspiration PNA, AKI with Cr 1.73, hyperkalemia 5.4, leukocytosis 14.8, mild macroytic anemia, and C diff colitis.   1. Multiple medical issues including sepsis, C diff colitis, aspiration PNA, acute hypoxic respiratory failure - per IM.  2. Elevated troponin - suspect demand process in the setting of above. Has never been evaluated from ischemic standpoint but would need to be done in the outpatient setting once he has recovered from acute illness. Will f/u echo to help guide decision. Continue ASA. F/u lipid panel in AM to determine if statin is  indicated.  3. SVT - does not appear to be afib or atrial flutter at this time, more likely SVT. Check thyroid function with next lab value. BB less ideal given his significant COPD and hypoxia. Will start short-acting diltiazem and follow on telemetry. The patient had allergy of amlodipine listed in his chart but the patient denies any history of this and does not ever recall being on this medication. He denies any serious allergic reaction to any medication in the past. Check  thyroid function with next labs in AM. Await echo. Advised d/c of alcohol.  4. Possible pulm edema on CXR with bilateral effusions - will review with MD. Change diet to heart healthy with 2L fluid restriction.  5. Alcohol abuse - patient is somewhat guarded when discussing intake. Further management of withdrawal prevention per primary team. Will start thiamine daily.  6. Acute kidney injury - baseline unclear, normal in 2014. Seems to have leveled off 1.4-1.5. Per IM.  Signed, Charlie Pitter PA-C 11/09/2016, 10:36 AM Pager: [phone]  Agree with findings by Melina Copa PA-C  David Barton was admitted on 111/15/17 with generalized weakness, cough and mild diarrhea. He is on the be acutely hypoxic most likely septic from aspiration pneumonia with renal insufficiency and C. difficile colitis. He has a history of colon cancer in the past. He is a DO NOT RESUSCITATE. We are asked to see him because of mildly elevated troponins. He has no prior cardiac history. He is a former tobacco user and has a history of hypertension treated. He denies chest pain. His EKG shows no acute changes. His troponins peaked at 0.14. He has had occasional PVCs a runs of PSVT. A 2-D echo is pending. At this point, I'm not concerned about his mildly elevated troponins in the setting of potential aspiration pneumonia and sepsis. This may be a type II non-STEMI from demand ischemia. He is on antibiotics. Will follow-up as 2-D echo. I do not think he needs an  ischemia workup at this time. We have ordered short-acting diltiazem for his PSVT.  Lorretta Harp, M.D., [place], [facility], [place] [address] [place], [place]  [id number]  [phone] 11/09/2016 12:07 PM

## 2016-11-09 NOTE — Progress Notes (Signed)
PROGRESS NOTE  David Barton VZD:[id number] DOB: 1933-10-27 DOA: 11/03/2016 PCP: Vena Austria, MD   Brief history: David Barton is a 82 y.o. male with medical history significant of Colon cancer,COPD, HTN who presents complaining of generalized weakness,  Was found to be have c diff diarrhea, and community acquired pneumonia.   Around 5 pm on 11/20 pt developed runs of non sustained SVT, he was given a dose of metoprolol and rate is better now is 80's.  He continues to have runs of SVT and he was started on low dose cardizem by cardiology.  Echocardiogram ordered.   HPI/Recap of past 24 hours:  No new complaints,  Reports breathing better.  No chest pain, sob, or palpitations.   Assessment/Plan: Active Problems:   COPD  GOLD II with reversibility   Essential hypertension   CAP (community acquired pneumonia)   PNA (pneumonia)   Diarrhea   AKI (acute kidney injury) ([place])   Hyperkalemia  Severe sepsis presented on admission with fever, leukocytosis, sinus tachycardia, hypoxia.  Secondary to aspiration pneumonia and C diff diarrhea. He was started on IV antibiotics and currently on [place] oxygen to keep sats greater than 90%. Blood cultures done on admission and negative so far. Sputum cultures show normal respiratory flora. No fever overnight,  leukocytosis resolved.  Lactic acid normalized. Weaned him down to 3 lit of [place] oxygen.  Would recommend to continue with IV antibiotics for another 24 hours before discontinuing it.     Acute hypoxic respiratory failure:  From sepsis and pna, currently on 3 liter, wean as tolerated. rpeat CXR in am shows pulmonary edema and he was given lasix .   will continue to monitor. Repeat CXR , no changes.   Cdiff: With recent abx exposure for uti treatment by pmd, due to disease severity, he is started on high dose oral vanc in addition to oral flagyl, will taper once diarrhea slows down and clinically more stable.currently has rectal  tube, diarrhea seems to be improving.   PNA: concerning for aspiration pneumonia CT :"RIGHT lower lobe consolidation concerning for pneumonia, possibly secondary to aspiration. Diffuse mild bronchial wall thickening can be seen with reactive airway dose disease or bronchitis. Secretions in the trachea and bronchi consistent with aspiration" Blood culture/sputum culture in process, urine legionella antigen negative, urine strep pneumo antigen negative One more day of IV antibiotics and antibiotics can be stopped.   Dysphagia: swallow eval, DG esophagus ordered showed esophageal dysmotility. Recommend GI consult on discharge or when his pneumonia and his sepsis improves.    AKI : likely from dehydration and sepsis, , hold home meds lisinopril /HCTZ. Monitor renal parameters.   COPD: no wheezing, not oxygen dependent at baseline, prior smoker.  Resume [place] ozygen to keep sats greater than 90%.   Daily alcohol use, denies h/o alcohol withdrawal problems, will monitor. No signs of withdrawal symptoms.   Progressive weakness, will need PT eval, may need to have spine imaging piror to discharge or to be scheduled by pmd, depends on patient's clinical condition, I have discussed this with patient and his wife, they expressed understanding.  Hypertension:  Accelerated, resume home homes and added hydralazine prn.   ? Runs of SVT; Pt asymptomatic, BB controlled it yesterday, serial troponins and echocardiogram ordered . Mild elevation of troponins, probably from demand ischemia. Echocardiogram shows LVEF of 30 to 29% , diastolic dysfunction cannot be evaluated, wall motion abnormalities cannot be excluded. Cardiology consulted for recommendations.   Hypomagnesemia: replete with  IV mag. Repeat the level in am.   Code Status: DNR  Family Communication:none at bedside, discussed with wife on 11/20.  Disposition Plan: home in 1 to 2 days.     Consultants:  cardiology  Procedures:  none  Antibiotics:  Rocephin/zithro till 11/22  Oral vanc/flagyl   Objective: BP (!) 135/91 (BP Location: Right Arm)   Pulse (!) 112   Temp 97.8 F (36.6 C) (Oral)   Resp 20   Ht '5\' 10"'$  (1.778 m)   Wt 108.6 kg (239 lb 6.7 oz)   SpO2 91%   BMI 34.35 kg/m   Intake/Output Summary (Last 24 hours) at 11/09/16 1624 Last data filed at 11/09/16 1319  Gross per 24 hour  Intake              806 ml  Output              900 ml  Net              -94 ml   Filed Weights   11/03/16 1855 11/09/16 1332  Weight: 108.9 kg (240 lb) 108.6 kg (239 lb 6.7 oz)    Exam:   General:  NAD on 3 lit of [place] oxygen.   Cardiovascular: RRR, no murmers   Respiratory: diminished at bases no wheezing.  Abdomen: Soft/ND/NT, positive BS, g tube in place.   Musculoskeletal: No Edema  Neuro: aaox3  Data Reviewed: Basic Metabolic Panel:  Recent Labs Lab 11/06/16 0307 11/07/16 0528 11/08/16 0513 11/08/16 1806 11/09/16 0620  NA 140 142 143 141 140  K 4.1 4.0 4.5 4.3 3.8  CL 107 112* 112* 109 108  CO2 '25 24 23 24 23  '$ GLUCOSE 100* 101* 140* 125* 112*  BUN 29* 22* 19 23* 24*  CREATININE 1.39* 1.35* 1.43* 1.47* 1.51*  CALCIUM 9.1 9.0 9.6 9.7 9.6  MG  --   --   --  1.4*  --    Liver Function Tests:  Recent Labs Lab 11/05/16 0307  AST 33  ALT 34  ALKPHOS 68  BILITOT 0.6  PROT 6.1*  ALBUMIN 2.7*   No results for input(s): LIPASE, AMYLASE in the last 168 hours. No results for input(s): AMMONIA in the last 168 hours. CBC:  Recent Labs Lab 11/05/16 0307 11/06/16 0307 11/07/16 0528 11/08/16 0513 11/09/16 0620  WBC 12.6* 7.4 6.7 8.4 6.8  NEUTROABS  --   --  4.7 6.4 4.6  HGB 10.0* 10.0* 10.1* 11.3* 11.4*  HCT 30.9* 31.2* 31.2* 35.8* 35.2*  MCV 110.4* 109.1* 110.6* 112.2* 109.0*  PLT 174 184 196 201 198   Cardiac Enzymes:    Recent Labs Lab 11/03/16 1846 11/08/16 1821 11/09/16 0008 11/09/16 0620  TROPONINI <0.03 0.18*  0.14* 0.11*   BNP (last 3 results) No results for input(s): BNP in the last 8760 hours.  ProBNP (last 3 results) No results for input(s): PROBNP in the last 8760 hours.  CBG:  Recent Labs Lab 11/03/16 1836  GLUCAP 116*    Recent Results (from the past 240 hour(s))  Urine culture     Status: None   Collection Time: 11/03/16  6:00 PM  Result Value Ref Range Status   Specimen Description URINE, CLEAN CATCH  Final   Special Requests NONE  Final   Culture NO GROWTH Performed at [facility]   Final   Report Status 11/05/2016 FINAL  Final  Culture, blood (Routine x 2)     Status: None   Collection Time:  11/03/16  6:49 PM  Result Value Ref Range Status   Specimen Description BLOOD LEFT ANTECUBITAL  Final   Special Requests BOTTLES DRAWN AEROBIC AND ANAEROBIC 5CC  Final   Culture   Final    NO GROWTH 5 DAYS Performed at [facility]    Report Status 11/08/2016 FINAL  Final  Culture, blood (Routine x 2)     Status: None   Collection Time: 11/03/16  7:20 PM  Result Value Ref Range Status   Specimen Description BLOOD LEFT FOREARM  Final   Special Requests BOTTLES DRAWN AEROBIC AND ANAEROBIC 5ML  Final   Culture   Final    NO GROWTH 5 DAYS Performed at [facility]    Report Status 11/08/2016 FINAL  Final  MRSA PCR Screening     Status: None   Collection Time: 11/04/16 12:53 AM  Result Value Ref Range Status   MRSA by PCR NEGATIVE NEGATIVE Final    Comment:        The GeneXpert MRSA Assay (FDA approved for NASAL specimens only), is one component of a comprehensive MRSA colonization surveillance program. It is not intended to diagnose MRSA infection nor to guide or monitor treatment for MRSA infections.   C difficile quick scan w PCR reflex     Status: Abnormal   Collection Time: 11/04/16  2:59 AM  Result Value Ref Range Status   C Diff antigen POSITIVE (A) NEGATIVE Final   C Diff toxin POSITIVE (A) NEGATIVE Final    Comment: CRITICAL RESULT  CALLED TO, READ BACK BY AND VERIFIED WITH: M.STREBLE,RN [id number] 11/04/16 W.SHEA    C Diff interpretation Toxin producing C. difficile detected.  Final    Comment: CRITICAL RESULT CALLED TO, READ BACK BY AND VERIFIED WITH: M.STREBLE,RN [id number] 11/04/16 W.SHEA   Culture, sputum-assessment     Status: None   Collection Time: 11/04/16  6:34 PM  Result Value Ref Range Status   Specimen Description EXPECTORATED SPUTUM  Final   Special Requests NONE  Final   Sputum evaluation   Final    THIS SPECIMEN IS ACCEPTABLE. RESPIRATORY CULTURE REPORT TO FOLLOW.   Report Status 11/04/2016 FINAL  Final  Culture, respiratory (NON-Expectorated)     Status: None   Collection Time: 11/04/16  6:34 PM  Result Value Ref Range Status   Specimen Description SPUTUM  Final   Special Requests NONE  Final   Gram Stain   Final    MODERATE WBC PRESENT, PREDOMINANTLY PMN MODERATE GRAM POSITIVE COCCI IN PAIRS FEW GRAM NEGATIVE RODS FEW GRAM VARIABLE ROD RARE YEAST    Culture   Final    Consistent with normal respiratory flora. Performed at [facility]    Report Status 11/07/2016 FINAL  Final     Studies: Dg Chest 2 View  Result Date: 11/08/2016 CLINICAL DATA:  82 y/o M; worsening shortness of breath since this morning. Elevated troponin. EXAM: CHEST  2 VIEW COMPARISON:  11/08/2016 chest radiograph. FINDINGS: Increasing distortion markings and linear performed lines compatible interstitial pulmonary edema. Small bilateral pleural effusions. Stable cardiac silhouette given projection and technique. Aortic atherosclerosis with arch calcification. Degenerative changes of the thoracic spine. IMPRESSION: Interstitial pulmonary edema and small bilateral effusions grossly unchanged from prior radiographs. Electronically Signed   By: Kristine Garbe M.D.   On: 11/08/2016 22:15    Scheduled Meds: . aspirin EC  81 mg Oral q morning - 10a  . azithromycin  500 mg Oral QHS  . cefTRIAXone (ROCEPHIN)  IV  1 g  Intravenous Q24H  . diltiazem  60 mg Oral Q6H  . enoxaparin (LOVENOX) injection  40 mg Subcutaneous Daily  . ferrous sulfate  325 mg Oral Q breakfast  . guaiFENesin  600 mg Oral BID  . hydrochlorothiazide  12.5 mg Oral Daily  . lactobacillus acidophilus & bulgar  2 tablet Oral TID WC  . lisinopril  20 mg Oral Daily  . mouth rinse  15 mL Mouth Rinse BID  . metroNIDAZOLE  500 mg Oral Q8H  . multivitamin with minerals  1 tablet Oral Daily  . sertraline  50 mg Oral Daily  . thiamine  100 mg Oral Daily  . tiotropium  18 mcg Inhalation Daily  . vancomycin  500 mg Oral Q6H    Continuous Infusions:    Time spent: 30 minutes.   Syrai Gladwin MD. [facility] Pager [phone] If 7PM-7AM, please contact night-coverage at [email or web address], password [facility] 11/09/2016, 4:24 PM  LOS: 6 days

## 2016-11-09 NOTE — Progress Notes (Signed)
Patient had a short run of SVT in the 180's lasting only a few seconds. Patient denies any SOB, Chest pain, palpitations or dizziness. Will continue to monitor closely.

## 2016-11-09 NOTE — Progress Notes (Signed)
  Echocardiogram 2D Echocardiogram with Definity has been performed.  Tresa Res 11/09/2016, 12:55 PM

## 2016-11-09 NOTE — Progress Notes (Signed)
Physical Therapy Treatment Patient Details Name: David Barton MRN: [id number] DOB: 10/11/1933 Today's Date: 11/09/2016    History of Present Illness  David Barton is a 82 y.o. male with medical history significant of Colon cancer,COPD, HTN who presents complaining of generalized weakness, unable to ambulate, diarrhea.     CT angio: RIGHT lower lobe consolidation concerning for pneumonia, possibly secondary to aspiration. Diffuse mild bronchial wall thickening can be seen with reactive airway dose disease or bronchitis. Secretions in the trachea and bronchi consistent with aspiration     PT Comments    The patient was tried on RA for bed mobility. Oxygen sats dropped to 86%. Replaced on 3 liters. HR 112 sats, 965 on # l. Pre ambulation, HR 144 and sats 87% post ambulation. RN notified. The patient is adamant about going home vs. Rehab. Currently, he is very deconditioned, cardio/pulmonary is limiting his ability to progress. Continue PT while in acute care.  Follow Up Recommendations  SNF;Supervision/Assistance - 24 hour (patient declines,      Equipment Recommendations  Rolling walker with 5" wheels    Recommendations for Other Services       Precautions / Restrictions Precautions Precautions: Fall Precaution Comments: Flexiseal, monitor sats and BP, HR going into 140-160'son oxygen    Mobility  Bed Mobility   Bed Mobility: Supine to Sit;Sit to Supine     Supine to sit: Min assist Sit to supine: Min assist   General bed mobility comments: assisted  with trunk to sitting upright. Able to get legs back into bed.  Transfers Overall transfer level: Needs assistance Equipment used: Rolling walker (2 wheeled) Transfers: Sit to/from Stand Sit to Stand: Min assist         General transfer comment: able to stand up without assitance at the RW, cues for hand placement  Ambulation/Gait Ambulation/Gait assistance: Min assist;+2 safety/equipment Ambulation Distance  (Feet): 22 Feet Assistive device: Rolling walker (2 wheeled) Gait Pattern/deviations: Step-to pattern;Step-through pattern;Decreased stride length     General Gait Details: steady assist with turning.   HR 144, dyspnea 4/4   Stairs            Wheelchair Mobility    Modified Rankin (Stroke Patients Only)       Balance Overall balance assessment: Needs assistance         Standing balance support: During functional activity;Bilateral upper extremity supported Standing balance-Leahy Scale: Poor                      Cognition Arousal/Alertness: Awake/alert                          Exercises      General Comments        Pertinent Vitals/Pain Pain Assessment: No/denies pain    Home Living                      Prior Function            PT Goals (current goals can now be found in the care plan section) Progress towards PT goals: Progressing toward goals    Frequency    Min 3X/week      PT Plan Current plan remains appropriate    Co-evaluation             End of Session Equipment Utilized During Treatment: Oxygen Activity Tolerance: Treatment limited secondary to medical complications (Comment) (HR 144) Patient left:  in bed;with call bell/phone within reach;with bed alarm set     Time: 1022-1040 PT Time Calculation (min) (ACUTE ONLY): 18 min  Charges:  $Gait Training: 8-22 mins                    G Codes:      Claretha Cooper 11/09/2016, 11:15 AM Tresa Endo PT [phone]

## 2016-11-10 DIAGNOSIS — I471 Supraventricular tachycardia: Secondary | ICD-10-CM

## 2016-11-10 DIAGNOSIS — A419 Sepsis, unspecified organism: Secondary | ICD-10-CM

## 2016-11-10 DIAGNOSIS — R778 Other specified abnormalities of plasma proteins: Secondary | ICD-10-CM

## 2016-11-10 DIAGNOSIS — N289 Disorder of kidney and ureter, unspecified: Secondary | ICD-10-CM

## 2016-11-10 DIAGNOSIS — R7989 Other specified abnormal findings of blood chemistry: Secondary | ICD-10-CM

## 2016-11-10 DIAGNOSIS — I428 Other cardiomyopathies: Secondary | ICD-10-CM

## 2016-11-10 DIAGNOSIS — R1310 Dysphagia, unspecified: Secondary | ICD-10-CM

## 2016-11-10 DIAGNOSIS — I429 Cardiomyopathy, unspecified: Secondary | ICD-10-CM

## 2016-11-10 DIAGNOSIS — J189 Pneumonia, unspecified organism: Secondary | ICD-10-CM

## 2016-11-10 DIAGNOSIS — A0472 Enterocolitis due to Clostridium difficile, not specified as recurrent: Secondary | ICD-10-CM

## 2016-11-10 DIAGNOSIS — I5021 Acute systolic (congestive) heart failure: Secondary | ICD-10-CM

## 2016-11-10 LAB — LIPID PANEL
CHOL/HDL RATIO: 1.8 ratio
Cholesterol: 106 mg/dL (ref 0–200)
HDL: 60 mg/dL (ref >40)
LDL CALC: 29 mg/dL (ref 0–99)
Triglycerides: 85 mg/dL (ref <150)
VLDL: 17 mg/dL (ref 0–40)

## 2016-11-10 LAB — BASIC METABOLIC PANEL
ANION GAP: 7 (ref 5–15)
BUN: 22 mg/dL — AB (ref 6–20)
CHLORIDE: 109 mmol/L (ref 101–111)
CO2: 26 mmol/L (ref 22–32)
Calcium: 9.8 mg/dL (ref 8.9–10.3)
Creatinine, Ser: 1.36 mg/dL — ABNORMAL HIGH (ref 0.61–1.24)
GFR calc Af Amer: 55 mL/min — ABNORMAL LOW (ref >60)
GFR calc non Af Amer: 48 mL/min — ABNORMAL LOW (ref >60)
GLUCOSE: 105 mg/dL — AB (ref 65–99)
POTASSIUM: 3.7 mmol/L (ref 3.5–5.1)
Sodium: 142 mmol/L (ref 135–145)

## 2016-11-10 LAB — CBC WITH DIFFERENTIAL/PLATELET
Basophils Absolute: 0.1 10*3/uL (ref 0.0–0.1)
Basophils Relative: 1 %
Eosinophils Absolute: 0.4 10*3/uL (ref 0.0–0.7)
Eosinophils Relative: 7 %
HEMATOCRIT: 34.6 % — AB (ref 39.0–52.0)
HEMOGLOBIN: 11.2 g/dL — AB (ref 13.0–17.0)
LYMPHS ABS: 0.9 10*3/uL (ref 0.7–4.0)
LYMPHS PCT: 16 %
MCH: 35.3 pg — AB (ref 26.0–34.0)
MCHC: 32.4 g/dL (ref 30.0–36.0)
MCV: 109.1 fL — AB (ref 78.0–100.0)
Monocytes Absolute: 0.9 10*3/uL (ref 0.1–1.0)
Monocytes Relative: 16 %
NEUTROS ABS: 3.3 10*3/uL (ref 1.7–7.7)
NEUTROS PCT: 60 %
Platelets: 204 10*3/uL (ref 150–400)
RBC: 3.17 MIL/uL — AB (ref 4.22–5.81)
RDW: 17.1 % — ABNORMAL HIGH (ref 11.5–15.5)
WBC: 5.5 10*3/uL (ref 4.0–10.5)

## 2016-11-10 LAB — BRAIN NATRIURETIC PEPTIDE: B NATRIURETIC PEPTIDE 5: 716.9 pg/mL — AB (ref 0.0–100.0)

## 2016-11-10 LAB — MAGNESIUM: Magnesium: 1.7 mg/dL (ref 1.7–2.4)

## 2016-11-10 MED ORDER — BUDESONIDE 0.25 MG/2ML IN SUSP
0.2500 mg | Freq: Two times a day (BID) | RESPIRATORY_TRACT | Status: DC
  Administered 2016-11-10 – 2016-11-20 (×19): 0.25 mg via RESPIRATORY_TRACT
  Filled 2016-11-10 (×19): qty 2

## 2016-11-10 MED ORDER — VITAMIN B-12 1000 MCG PO TABS
1000.0000 ug | ORAL_TABLET | Freq: Every day | ORAL | Status: DC
  Administered 2016-11-10 – 2016-11-20 (×11): 1000 ug via ORAL
  Filled 2016-11-10 (×11): qty 1

## 2016-11-10 MED ORDER — MAGNESIUM OXIDE 400 (241.3 MG) MG PO TABS
400.0000 mg | ORAL_TABLET | Freq: Every day | ORAL | Status: DC
  Administered 2016-11-10 – 2016-11-11 (×2): 400 mg via ORAL
  Filled 2016-11-10 (×2): qty 1

## 2016-11-10 MED ORDER — SACCHAROMYCES BOULARDII 250 MG PO CAPS
250.0000 mg | ORAL_CAPSULE | Freq: Two times a day (BID) | ORAL | Status: DC
  Administered 2016-11-10 – 2016-11-20 (×21): 250 mg via ORAL
  Filled 2016-11-10 (×21): qty 1

## 2016-11-10 MED ORDER — LIVING BETTER WITH HEART FAILURE BOOK
Freq: Once | Status: AC
  Administered 2016-11-10: 12:00:00

## 2016-11-10 NOTE — Plan of Care (Signed)
Problem: Food- and Nutrition-Related Knowledge Deficit (NB-1.1) Goal: Nutrition education Formal process to instruct or train a patient/client in a skill or to impart knowledge to help patients/clients voluntarily manage or modify food choices and eating behavior to maintain or improve health. Outcome: Completed/Met Date Met: 11/10/16 Nutrition Education Note  RD consulted for nutrition education regarding new onset CHF.  RD provided "Heart Failure Nutrition Therapy" handout from the [employer]. Reviewed patient's dietary recall. Provided examples on ways to decrease sodium intake in diet. Discouraged intake of processed foods and use of salt shaker. Encouraged fresh fruits and vegetables as well as whole grain sources of carbohydrates to maximize fiber intake.   RD discussed why it is important for patient to adhere to diet recommendations, and emphasized the role of fluids, foods to avoid, and importance of weighing self daily. Teach back method used.  Expect good compliance with wife's support.  Body mass index is 34.26 kg/m. Pt meets criteria for obesity based on current BMI.  Current diet order is Heart Healthy w/ 2L Fluid Restriction, patient is consuming approximately 100% of meals at this time. Labs and medications reviewed. No further nutrition interventions warranted at this time. If additional nutrition issues arise, please re-consult RD.   David Bibles, MS, RD, LDN Pager: [phone] After Hours Pager: [phone]

## 2016-11-10 NOTE — Progress Notes (Signed)
PROGRESS NOTE  PHILO KURTZ RWE:[id number] DOB: [birth year] DOA: 11/03/2016 PCP: Vena Austria, MD   Brief history: KESHAUN DUBEY is a 82 y.o. male with medical history significant of Colon cancer,COPD, HTN who presents complaining of generalized weakness,  Was found to be have c diff diarrhea, and community acquired pneumonia. Now with SVT and found to have systolic CHF with EF 61-95%  Assessment/Plan: Principal Problem:   Sepsis ([place]) Active Problems:   Acute respiratory failure ([place])   COPD  GOLD II with reversibility   Essential hypertension   CAP (community acquired pneumonia)   Aspiration pneumonia ([name])   Diarrhea   AKI (acute kidney injury) ([place])   Hyperkalemia   Elevated troponin   Acute systolic CHF (congestive heart failure) ([name])   Cardiomyopathy ([name])   PSVT (paroxysmal supraventricular tachycardia) ([name])  Severe sepsis presented on admission with fever, leukocytosis, sinus tachycardia, hypoxia.  Secondary to aspiration pneumonia and C diff diarrhea. He was started on IV antibiotics and currently on [place] oxygen to keep sats greater than 90%. Blood cultures done on admission and negative so far. Sputum cultures show normal respiratory flora. No fever overnight,  leukocytosis resolved.  Lactic acid normalized. Weaned him down to 3 lit of [place] oxygen.  Would recommend to continue with IV antibiotics for another 24 hours before discontinuing it.   Acute hypoxic respiratory failure: due to PNA and newly diagnosed systolic CHF with EF 09-32% -From sepsis and pna, currently on 3 liter, wean as tolerated. rpeat CXR in am shows pulmonary edema and he was given lasix .  -will continue to monitor.  -titrate O2 off as tolerated  -treatment to be adjusted by cardiology -low sodium diet -daily weights and strict I's and O's  Cdiff: With recent abx exposure for uti treatment by pmd, due to disease severity, he is started on high dose oral vanc in addition to oral  flagyl, will taper once diarrhea slows down and clinically more stable. currently has rectal tube, diarrhea seems to be improving. Added florastor to help with diarrhea   PNA: concerning for aspiration pneumonia CT :"RIGHT lower lobe consolidation concerning for pneumonia, possibly secondary to aspiration. Diffuse mild bronchial wall thickening can be seen with reactive airway dose disease or bronchitis. Secretions in the trachea and bronchi consistent with aspiration" Blood culture/sputum culture in process, urine legionella antigen negative, urine strep pneumo antigen negative One more day of IV antibiotics and antibiotics can be stopped.   Dysphagia:  -swallow eval, DG esophagus ordered showed esophageal dysmotility. Recommend GI consult on discharge or when his pneumonia and his sepsis improves.   AKI : likely from dehydration and sepsis, -will follow trend   COPD:  -not oxygen dependent at baseline, prior smoker.  -will continue weaning O2 as toelrated -resume spiriva and start pulmicort   Daily alcohol use, denies h/o alcohol withdrawal problems, will monitor.  -No signs of withdrawal symptoms.   Progressive weakness,  -will need PT eval, may need to have spine imaging piror to discharge or to be scheduled by pmd, depends on patient's clinical condition, I have discussed this with patient and his wife, they expressed understanding. -will also start B12 repletion   Hypertension:  Stable currently -will continue current antihypertensive regimen for now  ? Runs of SVT; -Pt asymptomatic currently and rate controlled -on diltiazem -low EF appreciated on Echo -cardiology on board -elevated troponin and no CP, due to demand ischemia most likely  Hypomagnesemia: -will follow trend and replete as needed  Code Status: DNR  Family Communication:none at bedside, discussed with wife on 11/20.  Disposition Plan: to be determined; needs PT evaluation and further adjustments on  medications from cardiology service. Will also continue treatment for C. Diff and complete antibiotics for PNA.   Consultants:  cardiology  Procedures:  2-D echo  -with diffuse hypokinesis, EF 30-35%  Antibiotics:  Rocephin/zithro till 11/22  Oral vanc/flagyl (planning for 14 days treatment)   Objective: BP 125/69 (BP Location: Right Arm)   Pulse 77   Temp 98.2 F (36.8 C) (Oral)   Resp 18   Ht '5\' 10"'$  (1.778 m)   Wt 108.3 kg (238 lb 12.1 oz)   SpO2 95%   BMI 34.26 kg/m   Intake/Output Summary (Last 24 hours) at 11/10/16 1815 Last data filed at 11/10/16 1800  Gross per 24 hour  Intake              410 ml  Output              825 ml  Net             -415 ml   Filed Weights   11/03/16 1855 11/09/16 1332 11/10/16 0541  Weight: 108.9 kg (240 lb) 108.6 kg (239 lb 6.7 oz) 108.3 kg (238 lb 12.1 oz)    Exam:   General:  NAD on 3 lit of [place] oxygen. Afebrile and feeling better overall.   Cardiovascular: RRR, no murmurs   Respiratory: diminished at bases, positive wheezing.  Abdomen: Soft/ND/NT, positive BS, g tube in place.   Musculoskeletal: trace Edema on LE and positive 1++ on his upper extremities bilaterally  Neuro: aaox3  Data Reviewed: Basic Metabolic Panel:  Recent Labs Lab 11/07/16 0528 11/08/16 0513 11/08/16 1806 11/09/16 0620 11/10/16 0331  NA 142 143 141 140 142  K 4.0 4.5 4.3 3.8 3.7  CL 112* 112* 109 108 109  CO2 '24 23 24 23 26  '$ GLUCOSE 101* 140* 125* 112* 105*  BUN 22* 19 23* 24* 22*  CREATININE 1.35* 1.43* 1.47* 1.51* 1.36*  CALCIUM 9.0 9.6 9.7 9.6 9.8  MG  --   --  1.4*  --  1.7   Liver Function Tests:  Recent Labs Lab 11/05/16 0307  AST 33  ALT 34  ALKPHOS 68  BILITOT 0.6  PROT 6.1*  ALBUMIN 2.7*   CBC:  Recent Labs Lab 11/06/16 0307 11/07/16 0528 11/08/16 0513 11/09/16 0620 11/10/16 0331  WBC 7.4 6.7 8.4 6.8 5.5  NEUTROABS  --  4.7 6.4 4.6 3.3  HGB 10.0* 10.1* 11.3* 11.4* 11.2*  HCT 31.2* 31.2* 35.8* 35.2*  34.6*  MCV 109.1* 110.6* 112.2* 109.0* 109.1*  PLT 184 196 201 198 204   Cardiac Enzymes:    Recent Labs Lab 11/03/16 1846 11/08/16 1821 11/09/16 0008 11/09/16 0620  TROPONINI <0.03 0.18* 0.14* 0.11*   BNP (last 3 results)  Recent Labs  11/10/16 0331  BNP 716.9*   CBG:  Recent Labs Lab 11/03/16 1836  GLUCAP 116*    Recent Results (from the past 240 hour(s))  Urine culture     Status: None   Collection Time: 11/03/16  6:00 PM  Result Value Ref Range Status   Specimen Description URINE, CLEAN CATCH  Final   Special Requests NONE  Final   Culture NO GROWTH Performed at [facility]   Final   Report Status 11/05/2016 FINAL  Final  Culture, blood (Routine x 2)     Status: None  Collection Time: 11/03/16  6:49 PM  Result Value Ref Range Status   Specimen Description BLOOD LEFT ANTECUBITAL  Final   Special Requests BOTTLES DRAWN AEROBIC AND ANAEROBIC 5CC  Final   Culture   Final    NO GROWTH 5 DAYS Performed at [facility]    Report Status 11/08/2016 FINAL  Final  Culture, blood (Routine x 2)     Status: None   Collection Time: 11/03/16  7:20 PM  Result Value Ref Range Status   Specimen Description BLOOD LEFT FOREARM  Final   Special Requests BOTTLES DRAWN AEROBIC AND ANAEROBIC 5ML  Final   Culture   Final    NO GROWTH 5 DAYS Performed at [facility]    Report Status 11/08/2016 FINAL  Final  MRSA PCR Screening     Status: None   Collection Time: 11/04/16 12:53 AM  Result Value Ref Range Status   MRSA by PCR NEGATIVE NEGATIVE Final    Comment:        The GeneXpert MRSA Assay (FDA approved for NASAL specimens only), is one component of a comprehensive MRSA colonization surveillance program. It is not intended to diagnose MRSA infection nor to guide or monitor treatment for MRSA infections.   C difficile quick scan w PCR reflex     Status: Abnormal   Collection Time: 11/04/16  2:59 AM  Result Value Ref Range Status   C Diff  antigen POSITIVE (A) NEGATIVE Final   C Diff toxin POSITIVE (A) NEGATIVE Final    Comment: CRITICAL RESULT CALLED TO, READ BACK BY AND VERIFIED WITH: M.STREBLE,RN [id number] 11/04/16 W.SHEA    C Diff interpretation Toxin producing C. difficile detected.  Final    Comment: CRITICAL RESULT CALLED TO, READ BACK BY AND VERIFIED WITH: M.STREBLE,RN [id number] 11/04/16 W.SHEA   Culture, sputum-assessment     Status: None   Collection Time: 11/04/16  6:34 PM  Result Value Ref Range Status   Specimen Description EXPECTORATED SPUTUM  Final   Special Requests NONE  Final   Sputum evaluation   Final    THIS SPECIMEN IS ACCEPTABLE. RESPIRATORY CULTURE REPORT TO FOLLOW.   Report Status 11/04/2016 FINAL  Final  Culture, respiratory (NON-Expectorated)     Status: None   Collection Time: 11/04/16  6:34 PM  Result Value Ref Range Status   Specimen Description SPUTUM  Final   Special Requests NONE  Final   Gram Stain   Final    MODERATE WBC PRESENT, PREDOMINANTLY PMN MODERATE GRAM POSITIVE COCCI IN PAIRS FEW GRAM NEGATIVE RODS FEW GRAM VARIABLE ROD RARE YEAST    Culture   Final    Consistent with normal respiratory flora. Performed at [facility]    Report Status 11/07/2016 FINAL  Final     Studies: No results found.  Scheduled Meds: . aspirin EC  81 mg Oral q morning - 10a  . cefTRIAXone (ROCEPHIN)  IV  1 g Intravenous Q24H  . diltiazem  60 mg Oral Q6H  . enoxaparin (LOVENOX) injection  40 mg Subcutaneous Daily  . ferrous sulfate  325 mg Oral Q breakfast  . guaiFENesin  600 mg Oral BID  . hydrochlorothiazide  12.5 mg Oral Daily  . lisinopril  20 mg Oral Daily  . magnesium oxide  400 mg Oral Daily  . mouth rinse  15 mL Mouth Rinse BID  . metroNIDAZOLE  500 mg Oral Q8H  . multivitamin with minerals  1 tablet Oral Daily  . saccharomyces boulardii  250 mg Oral BID  . sertraline  50 mg Oral Daily  . thiamine  100 mg Oral Daily  . tiotropium  18 mcg Inhalation Daily  . vancomycin  500 mg  Oral Q6H    Continuous Infusions:    Time spent: 30 minutes.   Barton Dubois MD. [facility] Pager [phone] If 7PM-7AM, please contact night-coverage at [email or web address], password [facility] 11/10/2016, 6:15 PM  LOS: 7 days

## 2016-11-10 NOTE — Progress Notes (Signed)
Patient Name: David Barton Date of Encounter: 11/10/2016  Primary Cardiologist: New to Dr. Serena Croissant Problem List     Principal Problem:   Sepsis [facility]) Active Problems:   Acute respiratory failure ([place])   COPD  GOLD II with reversibility   Essential hypertension   CAP (community acquired pneumonia)   Aspiration pneumonia ([place])   Diarrhea   AKI (acute kidney injury) ([place])   Hyperkalemia   Elevated troponin   Acute systolic CHF (congestive heart failure) ([name])   Cardiomyopathy ([name])   PSVT (paroxysmal supraventricular tachycardia) ([name])    Subjective   Breathing feels much better today. Laying 20 degrees in bed without SOB. Mild wheezing persists. HR improved.  Inpatient Medications    . aspirin EC  81 mg Oral q morning - 10a  . cefTRIAXone (ROCEPHIN)  IV  1 g Intravenous Q24H  . diltiazem  60 mg Oral Q6H  . enoxaparin (LOVENOX) injection  40 mg Subcutaneous Daily  . ferrous sulfate  325 mg Oral Q breakfast  . guaiFENesin  600 mg Oral BID  . hydrochlorothiazide  12.5 mg Oral Daily  . lactobacillus acidophilus & bulgar  2 tablet Oral TID WC  . lisinopril  20 mg Oral Daily  . mouth rinse  15 mL Mouth Rinse BID  . metroNIDAZOLE  500 mg Oral Q8H  . multivitamin with minerals  1 tablet Oral Daily  . sertraline  50 mg Oral Daily  . thiamine  100 mg Oral Daily  . tiotropium  18 mcg Inhalation Daily  . vancomycin  500 mg Oral Q6H    Vital Signs    Vitals:   11/09/16 1332 11/09/16 1751 11/09/16 2209 11/10/16 0541  BP:  129/78 127/87 127/78  Pulse:  83 77 76  Resp:   18 18  Temp:   98.7 F (37.1 C) 98.6 F (37 C)  TempSrc:   Oral Oral  SpO2:  91% 98% 98%  Weight: 239 lb 6.7 oz (108.6 kg)   238 lb 12.1 oz (108.3 kg)  Height:        Intake/Output Summary (Last 24 hours) at 11/10/16 0843 Last data filed at 11/10/16 0600  Gross per 24 hour  Intake              516 ml  Output             1075 ml  Net             -559 ml   Filed Weights   11/03/16  1855 11/09/16 1332 11/10/16 0541  Weight: 240 lb (108.9 kg) 239 lb 6.7 oz (108.6 kg) 238 lb 12.1 oz (108.3 kg)    Physical Exam    General: Well developed chronically ill appearing WM, in no acute distress. HEENT: Normocephalic, atraumatic, sclera non-icteric, no xanthomas, nares are without discharge. Neck: JVP not elevated. Lungs: Diffusely diminished BS with quiet exp wheezing. Breathing is unlabored. Cardiac: RRR S1 S2 without murmurs, rubs, or gallops.  Abdomen: Soft, non-tender, non-distended with normoactive bowel sounds. No rebound/guarding. Extremities: No clubbing or cyanosis. No edema. Distal pedal pulses are 2+ and equal bilaterally. Skin: Warm and dry, no significant rash. Neuro: Alert and oriented X 3. Sensation in tact. Follows commands. Psych:  Responds to questions appropriately with a normal affect.  Labs    CBC  Recent Labs  11/09/16 0620 11/10/16 0331  WBC 6.8 5.5  NEUTROABS 4.6 3.3  HGB 11.4* 11.2*  HCT 35.2* 34.6*  MCV 109.0*  109.1*  PLT 198 761   Basic Metabolic Panel  Recent Labs  11/08/16 1806 11/09/16 0620 11/10/16 0331  NA 141 140 142  K 4.3 3.8 3.7  CL 109 108 109  CO2 '24 23 26  '$ GLUCOSE 125* 112* 105*  BUN 23* 24* 22*  CREATININE 1.47* 1.51* 1.36*  CALCIUM 9.7 9.6 9.8  MG 1.4*  --   --    Cardiac Enzymes  Recent Labs  11/08/16 1821 11/09/16 0008 11/09/16 0620  TROPONINI 0.18* 0.14* 0.11*   Fasting Lipid Panel  Recent Labs  11/10/16 0331  CHOL 106  HDL 60  LDLCALC 29  TRIG 85  CHOLHDL 1.8   Thyroid Function Tests  Recent Labs  11/09/16 1153  TSH 5.734*    Telemetry     NSR (brief bradycardia this AM while sleeping)  Radiology    Dg Chest 2 View  Result Date: 11/08/2016 CLINICAL DATA:  82 y/o M; worsening shortness of breath since this morning. Elevated troponin. EXAM: CHEST  2 VIEW COMPARISON:  11/08/2016 chest radiograph. FINDINGS: Increasing distortion markings and linear performed lines compatible  interstitial pulmonary edema. Small bilateral pleural effusions. Stable cardiac silhouette given projection and technique. Aortic atherosclerosis with arch calcification. Degenerative changes of the thoracic spine. IMPRESSION: Interstitial pulmonary edema and small bilateral effusions grossly unchanged from prior radiographs. Electronically Signed   By: Kristine Garbe M.D.   On: 11/08/2016 22:15   Dg Chest 2 View  Result Date: 11/08/2016 CLINICAL DATA:  Low O2 sats, shortness of breath. EXAM: CHEST  2 VIEW COMPARISON:  11/08/2016 and CT chest 11/03/2016. FINDINGS: Trachea is midline. Heart size stable. Thoracic aorta is calcified. Mid and lower lung zone interstitial prominence and peripheral septal lines. Small bilateral pleural effusions. IMPRESSION: Findings suspicious for mild edema and small bilateral pleural effusions. Electronically Signed   By: Lorin Picket M.D.   On: 11/08/2016 09:09   Dg Chest 2 View  Result Date: 11/03/2016 CLINICAL DATA:  Shortness of breath, diarrhea, fatigue. History of COPD, colon cancer. EXAM: CHEST  2 VIEW COMPARISON:  Chest radiograph July 06, 2013 FINDINGS: Cardiomediastinal silhouette is upper limits of normal in size. Mildly calcified aorta. No pleural effusion or focal consolidation. No pneumothorax. Strandy densities LEFT lung base. Small nodular density projecting RIGHT lung base. Old RIGHT anterior rib fractures. Severe RIGHT glenohumeral osteoarthrosis. Moderate degenerative change of thoracic spine. IMPRESSION: Borderline cardiomegaly.  LEFT lung base atelectasis. **An incidental finding of potential clinical significance has been found. Small nodular density RIGHT lung base. Recommend CT chest at 6-12 months.** Electronically Signed   By: Elon Alas M.D.   On: 11/03/2016 20:16   Ct Angio Chest Pe W And/or Wo Contrast  Result Date: 11/03/2016 CLINICAL DATA:  Shortness of breath, fatigue. History of colon cancer, emphysema and COPD. EXAM:  CT ANGIOGRAPHY CHEST WITH CONTRAST TECHNIQUE: Multidetector CT imaging of the chest was performed using the standard protocol during bolus administration of intravenous contrast. Multiplanar CT image reconstructions and MIPs were obtained to evaluate the vascular anatomy. CONTRAST:  80 cc Isovue 370 COMPARISON:  Chest radiograph November 03, 2016 at 1957 hours and CT chest May 16, 2013 FINDINGS: CARDIOVASCULAR: Adequate contrast opacification of the pulmonary artery's. Main pulmonary artery is not enlarged. No pulmonary arterial filling defects to the level of the subsegmental branches. Heart size is normal, no right heart strain. Mild coronary artery calcifications. No pericardial effusions. Thoracic aorta is normal course and caliber, mild calcific atherosclerosis. MEDIASTINUM/NODES: No lymphadenopathy by CT size criteria.  Small RIGHT hilar lymph nodes without lymphadenopathy by CT size criteria. LUNGS/PLEURA: Mild bronchial wall thickening. Minimal secretions in the trachea mucous plugging RIGHT lower lobe bronchi. Diffuse mild bronchial wall thickening. Hypo enhancing RIGHT lower lobe consolidation with air bronchograms. Moderate centrilobular emphysema. No pleural effusion. No pulmonary nodule or mass. LEFT lung base atelectasis. UPPER ABDOMEN: Included view of the abdomen is nonacute. Adrenal glands not included. Hepatic steatosis. MUSCULOSKELETAL: Visualized soft tissues and included osseous structures are nonacute. Severe RIGHT glenohumeral osteoarthrosis with undersurface spurring. Old RIGHT antral lateral rib fractures. Review of the MIP images confirms the above findings. IMPRESSION: No acute pulmonary embolism. RIGHT lower lobe consolidation concerning for pneumonia, possibly secondary to aspiration. Diffuse mild bronchial wall thickening can be seen with reactive airway dose disease or bronchitis. Secretions in the trachea and bronchi consistent with aspiration. Moderate emphysema. No pulmonary nodule,  radiographic findings could be artifact or, secondary to RIGHT lung base consolidation. Electronically Signed   By: Elon Alas M.D.   On: 11/03/2016 23:15   Dg Esophagus  Result Date: 11/05/2016 CLINICAL DATA:  Dysphagia. EXAM: ESOPHOGRAM/BARIUM SWALLOW TECHNIQUE: Single contrast examination was performed using  thin barium. FLUOROSCOPY TIME:  Fluoroscopy Time:  1 minutes 30 seconds Radiation Exposure Index (if provided by the fluoroscopic device): Number of Acquired Spot Images: 0 COMPARISON:  None. FINDINGS: Moderate esophageal dysmotility. No stricture or mass. Barium passes readily into the stomach. No hiatal hernia. Stomach grossly normal. The patient swallowed barium while supine for the study. IMPRESSION: Esophageal dysmotility.  Negative for stricture or mass.  No hernia. Electronically Signed   By: Franchot Gallo M.D.   On: 11/05/2016 09:30   Dg Chest Port 1 View  Result Date: 11/08/2016 CLINICAL DATA:  Acute onset of shortness of breath. Initial encounter. EXAM: PORTABLE CHEST 1 VIEW COMPARISON:  Chest radiograph and CTA of the chest performed 11/03/2016 FINDINGS: The lungs are well-aerated. Vascular congestion is noted. Bilateral central and bibasilar airspace opacities raise concern for pulmonary edema, though pneumonia could have a similar appearance. Small bilateral pleural effusions are suspected. No pneumothorax is seen. The cardiomediastinal silhouette is within normal limits. No acute osseous abnormalities are seen. IMPRESSION: Vascular congestion noted. Bilateral central and bibasilar airspace opacities raise concern for pulmonary edema, though pneumonia could have a similar appearance. Small bilateral pleural effusions. Electronically Signed   By: Garald Balding M.D.   On: 11/08/2016 01:10    Patient Profile     82 y.o. male with history of COPD, colon CA, alcohol abuse, former tobacco abuse,  HTN, obesity, DNR presented to [facility] with generalized weakness, unable to ambulate,  cough, and mild diarrhea - found to acute hpoxic respiratory failure, sepsis due to aspiration PNA, AKI with Cr 1.73, hyperkalemia 5.4, leukocytosis 14.8, mild macroytic anemia, and C diff colitis.   Assessment & Plan    1. Multiple medical issues including sepsis, C diff colitis, aspiration PNA, acute hypoxic respiratory failure - per IM.  2. Elevated troponin - suspect demand process in the setting of above. Has never been evaluated from ischemic standpoint. Denies any recent angina. Interestingly does have deep ear creases which have been a/w CAD. Not presently ready from a pulm standpoint to undergo Lexiscan. May need to consider doing in the outpatient setting once recovered from acute illness. Lipids wnl. Continue ASA.   3. SVT - improved on diltiazem and with defervescence of acute illness. However, with LV dysfunction, will review with MD since diltiazem is less ideal with decreased EF. TSH mildly  elevated but fT4 normal - will need f/u of this by primary care as OP. Advised d/c of alcohol. Mild bradycardia this AM while sleeping of no clinical significance at this time. Mg level remains suboptimal, will start MagOx '400mg'$  daily and follow (do not want to be overly aggressive with CKD).  4. Acute systolic CHF with new recognition of LV dysfunction - etiology unclear, could be viral/septic, related to alcohol use or possibly ischemic in nature. Continue ACEI and maintenance HCTZ. Discussed low sodium diet, daily weights, 2L fluid restriction with pt. Will rx CHF book and ask nutritionist to see for education. Lung sounds still quite tight. ?Consider changing diltiazem to bisoprolol closer to DC.  5. Alcohol abuse - patient is somewhat guarded when discussing intake. Further management of withdrawal prevention per primary team.   6. Acute kidney injury - baseline unclear, normal in 2014. Seems to have leveled off 1.4-1.5. Per IM.  Signed, Charlie Pitter, PA-C  11/10/2016, 8:43 AM   Agree  with findings by Melina Copa PA-C  Pt feeling clinically improved. Rx with ATBX. 2D shows mod LV dysf (EF 30-35%). ? Etiology. Doubt minimally elevated trop signif. Possibly demand ischemia. SCr improving. Exam benign. Will need to start low dose Coreg and ACE-I (renal fxn permitting) prior to DC. Will follow 2D as OP.   Lorretta Harp, M.D., [place], [facility], [place] [address] [place], [place]  [id number]  [phone] 11/10/2016 11:53 AM

## 2016-11-10 NOTE — Progress Notes (Signed)
Pt instructed on use of Flutter Valve.  Pt demonstrated with good effort and technique.  RT to monitor and assess as needed.

## 2016-11-11 LAB — BASIC METABOLIC PANEL
ANION GAP: 9 (ref 5–15)
BUN: 22 mg/dL — ABNORMAL HIGH (ref 6–20)
CHLORIDE: 108 mmol/L (ref 101–111)
CO2: 25 mmol/L (ref 22–32)
CREATININE: 1.23 mg/dL (ref 0.61–1.24)
Calcium: 10 mg/dL (ref 8.9–10.3)
GFR calc non Af Amer: 54 mL/min — ABNORMAL LOW (ref >60)
Glucose, Bld: 95 mg/dL (ref 65–99)
Potassium: 3.6 mmol/L (ref 3.5–5.1)
Sodium: 142 mmol/L (ref 135–145)

## 2016-11-11 LAB — CBC WITH DIFFERENTIAL/PLATELET
BASOS PCT: 1 %
Basophils Absolute: 0 10*3/uL (ref 0.0–0.1)
Eosinophils Absolute: 0.3 10*3/uL (ref 0.0–0.7)
Eosinophils Relative: 6 %
HEMATOCRIT: 34.8 % — AB (ref 39.0–52.0)
HEMOGLOBIN: 11 g/dL — AB (ref 13.0–17.0)
LYMPHS ABS: 1 10*3/uL (ref 0.7–4.0)
LYMPHS PCT: 18 %
MCH: 34 pg (ref 26.0–34.0)
MCHC: 31.6 g/dL (ref 30.0–36.0)
MCV: 107.4 fL — ABNORMAL HIGH (ref 78.0–100.0)
MONOS PCT: 15 %
Monocytes Absolute: 0.8 10*3/uL (ref 0.1–1.0)
NEUTROS ABS: 3.3 10*3/uL (ref 1.7–7.7)
NEUTROS PCT: 60 %
Platelets: 226 10*3/uL (ref 150–400)
RBC: 3.24 MIL/uL — ABNORMAL LOW (ref 4.22–5.81)
RDW: 16.8 % — ABNORMAL HIGH (ref 11.5–15.5)
WBC: 5.5 10*3/uL (ref 4.0–10.5)

## 2016-11-11 LAB — MAGNESIUM: Magnesium: 1.5 mg/dL — ABNORMAL LOW (ref 1.7–2.4)

## 2016-11-11 MED ORDER — DILTIAZEM HCL 60 MG PO TABS
60.0000 mg | ORAL_TABLET | Freq: Three times a day (TID) | ORAL | Status: DC
  Administered 2016-11-11 – 2016-11-12 (×3): 60 mg via ORAL
  Filled 2016-11-11 (×3): qty 1

## 2016-11-11 MED ORDER — FUROSEMIDE 20 MG PO TABS
20.0000 mg | ORAL_TABLET | Freq: Every day | ORAL | Status: DC
  Administered 2016-11-11 – 2016-11-13 (×3): 20 mg via ORAL
  Filled 2016-11-11 (×3): qty 1

## 2016-11-11 MED ORDER — POTASSIUM CHLORIDE CRYS ER 20 MEQ PO TBCR
20.0000 meq | EXTENDED_RELEASE_TABLET | Freq: Every day | ORAL | Status: DC
  Administered 2016-11-11 – 2016-11-14 (×4): 20 meq via ORAL
  Filled 2016-11-11 (×4): qty 1

## 2016-11-11 MED ORDER — CARVEDILOL 6.25 MG PO TABS
6.2500 mg | ORAL_TABLET | Freq: Two times a day (BID) | ORAL | Status: DC
  Administered 2016-11-11 – 2016-11-12 (×3): 6.25 mg via ORAL
  Filled 2016-11-11 (×3): qty 1

## 2016-11-11 NOTE — Progress Notes (Signed)
Hospitalist made aware of Mag level 1.5- no new orders.

## 2016-11-11 NOTE — Progress Notes (Signed)
PROGRESS NOTE  David Barton RWE:[id number] DOB: October 01, 1933 DOA: 11/03/2016 PCP: Vena Austria, MD   Brief history: David Barton is a 82 y.o. male with medical history significant of Colon cancer,COPD, HTN who presents complaining of generalized weakness,  Was found to be have c diff diarrhea, and community acquired pneumonia. Now with SVT and found to have systolic CHF with EF 61-95%  Assessment/Plan: Principal Problem:   Sepsis ([place]) Active Problems:   Acute respiratory failure ([place])   COPD  GOLD II with reversibility   Essential hypertension   Community acquired pneumonia of right lung   Aspiration pneumonia ([name])   Diarrhea   AKI (acute kidney injury) ([place])   Hyperkalemia   Elevated troponin   Acute systolic CHF (congestive heart failure) ([name])   Cardiomyopathy ([name])   PSVT (paroxysmal supraventricular tachycardia) ([name])   Dysphagia   Renal insufficiency   Enteritis due to Clostridium difficile  Severe sepsis presented on admission with fever, leukocytosis, sinus tachycardia, hypoxia.  -Secondary to aspiration pneumonia and C diff diarrhea. He was started on IV antibiotics and currently on  oxygen to keep sats greater than 90%.  -Blood cultures done on admission and negative so far. Sputum cultures show normal respiratory flora.  -No further fever,  leukocytosis resolved and Lactic acid normalized.  -will continue weaning him off O2 supplementation as tolerated  -will now follow him off antibiotics, patient completed abx's treatment   Acute hypoxic respiratory failure: due to PNA and newly diagnosed systolic CHF with EF 09-32% -From sepsis and pna, currently on 3 liter; improving and currently stable -Rpeat CXR in am  -started on laxis low dose daily now -will continue to monitor.  -titrate O2 off as tolerated  -treatment to be adjusted by cardiology; will continue lisinopril and now coreg (plan is to titrate off diltiazem if possible and continue  titration up of B-blockers) -low sodium diet and daily weights and strict I's and O's  Cdiff: -With recent abx exposure for uti treatment by pmd, due to disease severity, he was started on high dose oral vanc in addition to oral flagyl, now that he has improved and diarrhea has slows down, will d/c flagyl -will d/c rectal tube  -continue florastor to help with diarrhea -plan is to treat for 14 days (currently day 2/14)  PNA: concerning for aspiration pneumonia -CT :"RIGHT lower lobe consolidation concerning for pneumonia, possibly secondary to aspiration. Diffuse mild bronchial wall thickening can be seen with reactive airway dose disease or bronchitis. Secretions in the trachea and bronchi consistent with aspiration". -has completed antibiotic therapy for PNA -Blood culture/sputum culture in process (essentially w/o growth), urine legionella antigen negative, urine strep pneumo antigen negative -will repeat CXR 2 views in am -continue Flutter valve  Dysphagia:  -swallow eval, DG esophagus ordered showed esophageal dysmotility. Recommend GI consult on discharge or when his pneumonia and his sepsis improves.   AKI : likely from dehydration and poor perfusion with sepsis,especially with low EF -will follow trend  -Cr improving and trending down appropriately   COPD:  -not oxygen dependent at baseline, prior smoker.  -will continue weaning O2 as toelrated -resume spiriva and continue pulmicort (planning to discharge on symbicort) -wheezing better now   Daily alcohol use, denies h/o alcohol withdrawal problems, will monitor.  -No signs of withdrawal symptoms.   Progressive weakness,  -will need PT re-eval, may need to have spine imaging piror to discharge or to be scheduled by PCP, depends on patient's clinical condition, I  have discussed this with patient and his wife, they expressed understanding. -will also start B12 repletion   Hypertension:  -Stable currently -will monitor and  adjust as needed; for now continue current antihypertensive regimen for now  ? Runs of SVT; -Pt asymptomatic currently and rate controlled -on diltiazem -low EF appreciated on Echo -cardiology on board -elevated troponin and no CP, due to demand ischemia most likely -will continue to monitor and adjust medications as needed  -plan is to keep potassium >4 and Mg as close as possible to 2  Hypomagnesemia: -will follow trend and replete as needed  -goal is to keep around 2  Code Status: DNR  Family Communication:none at bedside, discussed with wife on 11/20.  Disposition Plan: to be determined; needs PT re-evaluation and further adjustments on medications from cardiology service. Will also continue treatment for C. Diff and repeat CXR in am to follow improvement in aeration/infiltrates after completing antibiotics for PNA.   Consultants:  Cardiology  Procedures:  2-D echo  -with diffuse hypokinesis, EF 30-35%  Antibiotics:  Rocephin/zithro till 11/22  Oral vanc/flagyl (planning for 14 days treatment)   Objective: BP 118/63 (BP Location: Right Arm)   Pulse 69   Temp 98.1 F (36.7 C) (Oral)   Resp 18   Ht '5\' 10"'$  (1.778 m)   Wt 108.3 kg (238 lb 12.1 oz)   SpO2 92%   BMI 34.26 kg/m   Intake/Output Summary (Last 24 hours) at 11/11/16 1646 Last data filed at 11/11/16 1314  Gross per 24 hour  Intake              650 ml  Output             1150 ml  Net             -500 ml   Filed Weights   11/09/16 1332 11/10/16 0541 11/11/16 0517  Weight: 108.6 kg (239 lb 6.7 oz) 108.3 kg (238 lb 12.1 oz) 108.3 kg (238 lb 12.1 oz)    Exam:   General:  NAD on 3 lit of [place] oxygen and reporting breathing to be stable. Afebrile and feeling better overall. Swelling continue to improved. No fever and denies CP.  Cardiovascular: RRR, no murmurs   Respiratory: diminished at bases, positive wheezing.  Abdomen: Soft/ND/NT, positive BS, g tube in place.   Musculoskeletal: trace  Edema on LE and positive 1++ on his upper extremities bilaterally  Neuro: aaox3  Data Reviewed: Basic Metabolic Panel:  Recent Labs Lab 11/08/16 0513 11/08/16 1806 11/09/16 0620 11/10/16 0331 11/11/16 0439  NA 143 141 140 142 142  K 4.5 4.3 3.8 3.7 3.6  CL 112* 109 108 109 108  CO2 '23 24 23 26 25  '$ GLUCOSE 140* 125* 112* 105* 95  BUN 19 23* 24* 22* 22*  CREATININE 1.43* 1.47* 1.51* 1.36* 1.23  CALCIUM 9.6 9.7 9.6 9.8 10.0  MG  --  1.4*  --  1.7 1.5*   Liver Function Tests:  Recent Labs Lab 11/05/16 0307  AST 33  ALT 34  ALKPHOS 68  BILITOT 0.6  PROT 6.1*  ALBUMIN 2.7*   CBC:  Recent Labs Lab 11/07/16 0528 11/08/16 0513 11/09/16 0620 11/10/16 0331 11/11/16 0439  WBC 6.7 8.4 6.8 5.5 5.5  NEUTROABS 4.7 6.4 4.6 3.3 3.3  HGB 10.1* 11.3* 11.4* 11.2* 11.0*  HCT 31.2* 35.8* 35.2* 34.6* 34.8*  MCV 110.6* 112.2* 109.0* 109.1* 107.4*  PLT 196 201 198 204 226   Cardiac Enzymes:  Recent Labs Lab 11/08/16 1821 11/09/16 0008 11/09/16 0620  TROPONINI 0.18* 0.14* 0.11*   BNP (last 3 results)  Recent Labs  11/10/16 0331  BNP 716.9*   CBG: No results for input(s): GLUCAP in the last 168 hours.  Recent Results (from the past 240 hour(s))  Urine culture     Status: None   Collection Time: 11/03/16  6:00 PM  Result Value Ref Range Status   Specimen Description URINE, CLEAN CATCH  Final   Special Requests NONE  Final   Culture NO GROWTH Performed at [facility]   Final   Report Status 11/05/2016 FINAL  Final  Culture, blood (Routine x 2)     Status: None   Collection Time: 11/03/16  6:49 PM  Result Value Ref Range Status   Specimen Description BLOOD LEFT ANTECUBITAL  Final   Special Requests BOTTLES DRAWN AEROBIC AND ANAEROBIC 5CC  Final   Culture   Final    NO GROWTH 5 DAYS Performed at [facility]    Report Status 11/08/2016 FINAL  Final  Culture, blood (Routine x 2)     Status: None   Collection Time: 11/03/16  7:20 PM  Result  Value Ref Range Status   Specimen Description BLOOD LEFT FOREARM  Final   Special Requests BOTTLES DRAWN AEROBIC AND ANAEROBIC 5ML  Final   Culture   Final    NO GROWTH 5 DAYS Performed at [facility]    Report Status 11/08/2016 FINAL  Final  MRSA PCR Screening     Status: None   Collection Time: 11/04/16 12:53 AM  Result Value Ref Range Status   MRSA by PCR NEGATIVE NEGATIVE Final    Comment:        The GeneXpert MRSA Assay (FDA approved for NASAL specimens only), is one component of a comprehensive MRSA colonization surveillance program. It is not intended to diagnose MRSA infection nor to guide or monitor treatment for MRSA infections.   C difficile quick scan w PCR reflex     Status: Abnormal   Collection Time: 11/04/16  2:59 AM  Result Value Ref Range Status   C Diff antigen POSITIVE (A) NEGATIVE Final   C Diff toxin POSITIVE (A) NEGATIVE Final    Comment: CRITICAL RESULT CALLED TO, READ BACK BY AND VERIFIED WITH: M.STREBLE,RN [id number] 11/04/16 W.SHEA    C Diff interpretation Toxin producing C. difficile detected.  Final    Comment: CRITICAL RESULT CALLED TO, READ BACK BY AND VERIFIED WITH: M.STREBLE,RN [id number] 11/04/16 W.SHEA   Culture, sputum-assessment     Status: None   Collection Time: 11/04/16  6:34 PM  Result Value Ref Range Status   Specimen Description EXPECTORATED SPUTUM  Final   Special Requests NONE  Final   Sputum evaluation   Final    THIS SPECIMEN IS ACCEPTABLE. RESPIRATORY CULTURE REPORT TO FOLLOW.   Report Status 11/04/2016 FINAL  Final  Culture, respiratory (NON-Expectorated)     Status: None   Collection Time: 11/04/16  6:34 PM  Result Value Ref Range Status   Specimen Description SPUTUM  Final   Special Requests NONE  Final   Gram Stain   Final    MODERATE WBC PRESENT, PREDOMINANTLY PMN MODERATE GRAM POSITIVE COCCI IN PAIRS FEW GRAM NEGATIVE RODS FEW GRAM VARIABLE ROD RARE YEAST    Culture   Final    Consistent with normal respiratory  flora. Performed at [facility]    Report Status 11/07/2016 FINAL  Final  Studies: No results found.  Scheduled Meds: . aspirin EC  81 mg Oral q morning - 10a  . budesonide (PULMICORT) nebulizer solution  0.25 mg Nebulization BID  . carvedilol  6.25 mg Oral BID WC  . diltiazem  60 mg Oral Q8H  . enoxaparin (LOVENOX) injection  40 mg Subcutaneous Daily  . ferrous sulfate  325 mg Oral Q breakfast  . furosemide  20 mg Oral Daily  . guaiFENesin  600 mg Oral BID  . lisinopril  20 mg Oral Daily  . magnesium oxide  400 mg Oral Daily  . mouth rinse  15 mL Mouth Rinse BID  . multivitamin with minerals  1 tablet Oral Daily  . potassium chloride  20 mEq Oral Daily  . saccharomyces boulardii  250 mg Oral BID  . sertraline  50 mg Oral Daily  . thiamine  100 mg Oral Daily  . tiotropium  18 mcg Inhalation Daily  . vancomycin  500 mg Oral Q6H  . vitamin B-12  1,000 mcg Oral Daily    Continuous Infusions:    Time spent: 30 minutes.   Barton Dubois MD. [facility] Pager [phone] If 7PM-7AM, please contact night-coverage at [email or web address], password [facility] 11/11/2016, 4:46 PM  LOS: 8 days

## 2016-11-11 NOTE — Progress Notes (Signed)
Rectal tube D/C'd per MD [facility] verbal order. Patient tolerated well.

## 2016-11-12 ENCOUNTER — Inpatient Hospital Stay (HOSPITAL_COMMUNITY): Payer: Medicare Other

## 2016-11-12 DIAGNOSIS — I255 Ischemic cardiomyopathy: Secondary | ICD-10-CM

## 2016-11-12 LAB — CBC WITH DIFFERENTIAL/PLATELET
BASOS ABS: 0 10*3/uL (ref 0.0–0.1)
BASOS PCT: 1 %
Eosinophils Absolute: 0.3 10*3/uL (ref 0.0–0.7)
Eosinophils Relative: 6 %
HEMATOCRIT: 33.4 % — AB (ref 39.0–52.0)
HEMOGLOBIN: 10.6 g/dL — AB (ref 13.0–17.0)
Lymphocytes Relative: 16 %
Lymphs Abs: 0.9 10*3/uL (ref 0.7–4.0)
MCH: 34.6 pg — ABNORMAL HIGH (ref 26.0–34.0)
MCHC: 31.7 g/dL (ref 30.0–36.0)
MCV: 109.2 fL — ABNORMAL HIGH (ref 78.0–100.0)
Monocytes Absolute: 0.8 10*3/uL (ref 0.1–1.0)
Monocytes Relative: 14 %
NEUTROS ABS: 3.5 10*3/uL (ref 1.7–7.7)
NEUTROS PCT: 63 %
Platelets: 223 10*3/uL (ref 150–400)
RBC: 3.06 MIL/uL — AB (ref 4.22–5.81)
RDW: 17 % — ABNORMAL HIGH (ref 11.5–15.5)
WBC: 5.5 10*3/uL (ref 4.0–10.5)

## 2016-11-12 LAB — MAGNESIUM: Magnesium: 1.4 mg/dL — ABNORMAL LOW (ref 1.7–2.4)

## 2016-11-12 MED ORDER — CARVEDILOL 12.5 MG PO TABS
12.5000 mg | ORAL_TABLET | Freq: Two times a day (BID) | ORAL | Status: DC
  Administered 2016-11-12 – 2016-11-13 (×3): 12.5 mg via ORAL
  Filled 2016-11-12 (×3): qty 1

## 2016-11-12 MED ORDER — MAGNESIUM OXIDE 400 (241.3 MG) MG PO TABS
400.0000 mg | ORAL_TABLET | Freq: Two times a day (BID) | ORAL | Status: DC
  Administered 2016-11-12 – 2016-11-20 (×17): 400 mg via ORAL
  Filled 2016-11-12 (×18): qty 1

## 2016-11-12 MED ORDER — MAGNESIUM OXIDE 400 (241.3 MG) MG PO TABS
400.0000 mg | ORAL_TABLET | Freq: Once | ORAL | Status: AC
  Administered 2016-11-12: 400 mg via ORAL

## 2016-11-12 MED ORDER — DILTIAZEM HCL 30 MG PO TABS
30.0000 mg | ORAL_TABLET | Freq: Three times a day (TID) | ORAL | Status: DC
Start: 2016-11-12 — End: 2016-11-14
  Administered 2016-11-12 – 2016-11-14 (×6): 30 mg via ORAL
  Filled 2016-11-12 (×6): qty 1

## 2016-11-12 NOTE — Progress Notes (Signed)
   11/12/16 1300  Clinical Encounter Type  Visited With Patient and family together  Visit Type Initial  Referral From Chaplain  Consult/Referral To Chaplain  Spiritual Encounters  Spiritual Needs Prayer  Stress Factors  Patient Stress Factors None identified  CHP sought out patients to visit who have been here several days.  Visited with patient and wife, providing presence and prayer. Roe Coombs 11/12/16

## 2016-11-12 NOTE — Progress Notes (Signed)
PROGRESS NOTE  David Barton MVH:[id number] DOB: May 15, 1933 DOA: 11/03/2016 PCP: David Austria, MD   Brief history: David Barton is a 82 y.o. male with medical history significant of Colon cancer,COPD, HTN who presents complaining of generalized weakness,  Was found to be have c diff diarrhea, and community acquired pneumonia. Now with SVT and found to have systolic CHF with EF 84-13%  Assessment/Plan: Principal Problem:   Sepsis ([place]) Active Problems:   Acute respiratory failure ([place])   COPD  GOLD II with reversibility   Essential hypertension   Community acquired pneumonia of right lung   Aspiration pneumonia ([name])   Diarrhea   AKI (acute kidney injury) ([place])   Hyperkalemia   Elevated troponin   Acute systolic CHF (congestive heart failure) ([name])   Cardiomyopathy ([name])   PSVT (paroxysmal supraventricular tachycardia) ([name])   Dysphagia   Renal insufficiency   Enteritis due to Clostridium difficile  Severe sepsis presented on admission with fever, leukocytosis, sinus tachycardia, hypoxia.  -Secondary to aspiration pneumonia and C diff diarrhea. He was started on IV antibiotics and currently on [place] oxygen to keep sats greater than 90%.  -Blood cultures done on admission and negative so far. Sputum cultures show normal respiratory flora.  -No further fever,  leukocytosis resolved and Lactic acid normalized.  -will continue weaning him off O2 supplementation as tolerated  -will now follow him off antibiotics, patient completed abx's treatment   Acute hypoxic respiratory failure: due to PNA and newly diagnosed systolic CHF with EF 24-40% -From sepsis and pna, currently on 3 liter; improving and currently stable -Rpeat CXR in am  -continue laxis low dose daily now -will continue to monitor.  -titrate O2 off as tolerated  -treatment to be adjusted by cardiology; will continue lisinopril and now coreg (plan is to titrate off diltiazem if possible and continue  titration up of B-blockers) -low sodium diet and daily weights and strict I's and O's  Cdiff: -With recent abx exposure for uti treatment by pmd, due to disease severity, he was started on high dose oral vanc in addition to oral flagyl, now that he has improved and diarrhea has slows down, will d/c flagyl -will d/c rectal tube  -continue florastor to help with diarrhea -plan is to treat for 14 days (currently day 3/14)  PNA: concerning for aspiration pneumonia -CT :"RIGHT lower lobe consolidation concerning for pneumonia, possibly secondary to aspiration. Diffuse mild bronchial wall thickening can be seen with reactive airway dose disease or bronchitis. Secretions in the trachea and bronchi consistent with aspiration". -has completed antibiotic therapy for PNA -Blood culture/sputum culture in process (essentially w/o growth), urine legionella antigen negative, urine strep pneumo antigen negative -Repeated CXR 2 views in am, with improved aeration, no infiltrates or consolidation seen -continue Flutter valve and IS  Dysphagia:  -swallow eval, DG esophagus ordered showed esophageal dysmotility. Recommend GI consult on discharge or when his pneumonia and his sepsis improves.   AKI : likely from dehydration and poor perfusion with sepsis, especially with low EF -will follow trend  -Cr improving and trending down appropriately  -will repeat BMET in am  COPD:  -not oxygen dependent at baseline, prior smoker.  -will continue weaning O2 as toelrated -resume spiriva and continue pulmicort (planning to discharge on symbicort) -mild wheezing only on exam and moving more air.   Daily alcohol use, denies h/o alcohol withdrawal problems, will monitor.  -No signs of withdrawal symptoms. -will continue thiamine and B12 -patient with macrocytosis, most likely  associated with alcohol consumption    Progressive weakness,  -will need PT re-eval, may need to have spine imaging piror to discharge or to  be scheduled by PCP, depends on patient's clinical condition, I have discussed this with patient and his wife, they expressed understanding. -will also start B12 repletion   Hypertension:  -Stable currently -will monitor and adjust as needed; for now continue current antihypertensive regimen for now  PSVT; -Pt asymptomatic currently and rate controlled -on diltiazem -low EF appreciated on Echo -cardiology on board -elevated troponin and no CP, due to demand ischemia most likely -will continue to monitor and adjust medications as needed  -plan is to keep potassium >4 and Mg as close as possible to 2  Hypomagnesemia: -will follow trend and replete as needed  -goal is to keep around 2 -dose adjusted to '400mg'$  BID  Code Status: DNR  Family Communication:none at bedside, discussed with wife on 11/20.  Disposition Plan: to be determined; needs PT re-evaluation and further adjustments on medications from cardiology service. Will also continue treatment for C. Diff and repeat CXR in am to follow improvement in aeration/infiltrates after completing antibiotics for PNA.   Consultants:  Cardiology  Procedures:  2-D echo  -with diffuse hypokinesis, EF 30-35%  Antibiotics:  Rocephin/zithro till 11/22  Oral vanc/flagyl (planning for 14 days treatment)   Objective: BP 139/71 (BP Location: Left Arm)   Pulse 66   Temp 97.8 F (36.6 C) (Oral)   Resp 18   Ht '5\' 10"'$  (1.778 m)   Wt 106.3 kg (234 lb 5.6 oz)   SpO2 91%   BMI 33.63 kg/m   Intake/Output Summary (Last 24 hours) at 11/12/16 1556 Last data filed at 11/12/16 0545  Gross per 24 hour  Intake                0 ml  Output              700 ml  Net             -700 ml   Filed Weights   11/10/16 0541 11/11/16 0517 11/12/16 0545  Weight: 108.3 kg (238 lb 12.1 oz) 108.3 kg (238 lb 12.1 oz) 106.3 kg (234 lb 5.6 oz)    Exam:   General:  NAD on 2 lit of [place] oxygen now and reporting breathing to be stable. Afebrile and  feeling better overall. Swelling continue to improved. No fever and denies CP.  Cardiovascular: RRR, no murmurs   Respiratory: diminished at bases, positive mild wheezing. Overall improved air movement   Abdomen: Soft/ND/NT, positive BS, g tube in place.   Musculoskeletal: trace Edema on LE and positive trace on his upper extremities bilaterally  Neuro: aaox3  Data Reviewed: Basic Metabolic Panel:  Recent Labs Lab 11/08/16 0513 11/08/16 1806 11/09/16 0620 11/10/16 0331 11/11/16 0439 11/12/16 0358  NA 143 141 140 142 142  --   K 4.5 4.3 3.8 3.7 3.6  --   CL 112* 109 108 109 108  --   CO2 '23 24 23 26 25  '$ --   GLUCOSE 140* 125* 112* 105* 95  --   BUN 19 23* 24* 22* 22*  --   CREATININE 1.43* 1.47* 1.51* 1.36* 1.23  --   CALCIUM 9.6 9.7 9.6 9.8 10.0  --   MG  --  1.4*  --  1.7 1.5* 1.4*   CBC:  Recent Labs Lab 11/08/16 0513 11/09/16 0620 11/10/16 0331 11/11/16 0439 11/12/16 0358  WBC  8.4 6.8 5.5 5.5 5.5  NEUTROABS 6.4 4.6 3.3 3.3 3.5  HGB 11.3* 11.4* 11.2* 11.0* 10.6*  HCT 35.8* 35.2* 34.6* 34.8* 33.4*  MCV 112.2* 109.0* 109.1* 107.4* 109.2*  PLT 201 198 204 226 223   Cardiac Enzymes:    Recent Labs Lab 11/08/16 1821 11/09/16 0008 11/09/16 0620  TROPONINI 0.18* 0.14* 0.11*   BNP (last 3 results)  Recent Labs  11/10/16 0331  BNP 716.9*   CBG: No results for input(s): GLUCAP in the last 168 hours.  Recent Results (from the past 240 hour(s))  Urine culture     Status: None   Collection Time: 11/03/16  6:00 PM  Result Value Ref Range Status   Specimen Description URINE, CLEAN CATCH  Final   Special Requests NONE  Final   Culture NO GROWTH Performed at [facility]   Final   Report Status 11/05/2016 FINAL  Final  Culture, blood (Routine x 2)     Status: None   Collection Time: 11/03/16  6:49 PM  Result Value Ref Range Status   Specimen Description BLOOD LEFT ANTECUBITAL  Final   Special Requests BOTTLES DRAWN AEROBIC AND ANAEROBIC 5CC   Final   Culture   Final    NO GROWTH 5 DAYS Performed at [facility]    Report Status 11/08/2016 FINAL  Final  Culture, blood (Routine x 2)     Status: None   Collection Time: 11/03/16  7:20 PM  Result Value Ref Range Status   Specimen Description BLOOD LEFT FOREARM  Final   Special Requests BOTTLES DRAWN AEROBIC AND ANAEROBIC 5ML  Final   Culture   Final    NO GROWTH 5 DAYS Performed at [facility]    Report Status 11/08/2016 FINAL  Final  MRSA PCR Screening     Status: None   Collection Time: 11/04/16 12:53 AM  Result Value Ref Range Status   MRSA by PCR NEGATIVE NEGATIVE Final    Comment:        The GeneXpert MRSA Assay (FDA approved for NASAL specimens only), is one component of a comprehensive MRSA colonization surveillance program. It is not intended to diagnose MRSA infection nor to guide or monitor treatment for MRSA infections.   C difficile quick scan w PCR reflex     Status: Abnormal   Collection Time: 11/04/16  2:59 AM  Result Value Ref Range Status   C Diff antigen POSITIVE (A) NEGATIVE Final   C Diff toxin POSITIVE (A) NEGATIVE Final    Comment: CRITICAL RESULT CALLED TO, READ BACK BY AND VERIFIED WITH: M.STREBLE,RN [id number] 11/04/16 W.SHEA    C Diff interpretation Toxin producing C. difficile detected.  Final    Comment: CRITICAL RESULT CALLED TO, READ BACK BY AND VERIFIED WITH: M.STREBLE,RN 0433 11/04/16 W.SHEA   Culture, sputum-assessment     Status: None   Collection Time: 11/04/16  6:34 PM  Result Value Ref Range Status   Specimen Description EXPECTORATED SPUTUM  Final   Special Requests NONE  Final   Sputum evaluation   Final    THIS SPECIMEN IS ACCEPTABLE. RESPIRATORY CULTURE REPORT TO FOLLOW.   Report Status 11/04/2016 FINAL  Final  Culture, respiratory (NON-Expectorated)     Status: None   Collection Time: 11/04/16  6:34 PM  Result Value Ref Range Status   Specimen Description SPUTUM  Final   Special Requests NONE  Final    Gram Stain   Final    MODERATE WBC PRESENT, PREDOMINANTLY  PMN MODERATE GRAM POSITIVE COCCI IN PAIRS FEW GRAM NEGATIVE RODS FEW GRAM VARIABLE ROD RARE YEAST    Culture   Final    Consistent with normal respiratory flora. Performed at [facility]    Report Status 11/07/2016 FINAL  Final     Studies: Dg Chest 2 View  Result Date: 11/12/2016 CLINICAL DATA:  Shortness of breath, fever and cough for 1 week, history hypertension, former smoker, COPD, colon cancer EXAM: CHEST  2 VIEW COMPARISON:  11 20,017 FINDINGS: Upper normal heart size. Mediastinal contours and pulmonary vascularity normal. Atherosclerotic calcification aorta. Small bibasilar pleural effusions. Underlying emphysematous changes. No definite acute infiltrate or pneumothorax. Bones demineralized. IMPRESSION: COPD changes with bibasilar small pleural effusions. No definite acute infiltrate. Electronically Signed   By: Lavonia Dana M.D.   On: 11/12/2016 09:07    Scheduled Meds: . aspirin EC  81 mg Oral q morning - 10a  . budesonide (PULMICORT) nebulizer solution  0.25 mg Nebulization BID  . carvedilol  12.5 mg Oral BID WC  . diltiazem  30 mg Oral Q8H  . enoxaparin (LOVENOX) injection  40 mg Subcutaneous Daily  . ferrous sulfate  325 mg Oral Q breakfast  . furosemide  20 mg Oral Daily  . guaiFENesin  600 mg Oral BID  . lisinopril  20 mg Oral Daily  . magnesium oxide  400 mg Oral BID  . mouth rinse  15 mL Mouth Rinse BID  . multivitamin with minerals  1 tablet Oral Daily  . potassium chloride  20 mEq Oral Daily  . saccharomyces boulardii  250 mg Oral BID  . sertraline  50 mg Oral Daily  . thiamine  100 mg Oral Daily  . tiotropium  18 mcg Inhalation Daily  . vancomycin  500 mg Oral Q6H  . vitamin B-12  1,000 mcg Oral Daily    Time spent: 30 minutes.   Barton Dubois MD. [facility] Pager [phone] If 7PM-7AM, please contact night-coverage at [email or web address], password [facility] 11/12/2016, 3:56 PM  LOS: 9  days

## 2016-11-12 NOTE — Progress Notes (Signed)
Physical Therapy Treatment Patient Details Name: David Barton MRN: [id number] DOB: [birth year] Today's Date: 11/12/2016    History of Present Illness  David Barton is a 82 y.o. male with medical history significant of Colon cancer,COPD, HTN who presents complaining of generalized weakness, unable to ambulate, diarrhea.     CT angio: RIGHT lower lobe consolidation concerning for pneumonia, possibly secondary to aspiration. Diffuse mild bronchial wall thickening can be seen with reactive airway dose disease or bronchitis. Secretions in the trachea and bronchi consistent with aspiration     PT Comments    Patient was seen in bed upon arrival. Performed supine to sit x minA to raise trunk in bed. O2 sats were 91%, HR 74 bpm in supine and 88-89%, 82 bpm on EOB. Patient was on 2L of O2. Performed sit to stand x minA/min guard from elevated surface. VC's required for UE placement.  Gait x 30 ft x min guard. VC's required for pacing and to utilize pursed lip breathing. Sats dropped to 72% during ambulation, performed pursed lip breathing, sats decreased to 78% and HR was 98 bpm . Increased pt to 4L of O2 and sats increased to 86%. Left patient repositioned in recliner on 4L of O2 with O2 sats at 87%. Notified nursing.  Patient is limited due to decreased activity tolerance, decreased endurance, and medical complications.   Follow Up Recommendations  SNF;Supervision/Assistance - 24 hour     Equipment Recommendations  Rolling walker with 5" wheels    Recommendations for Other Services       Precautions / Restrictions Precautions Precautions: Fall Precaution Comments: Flexiseal, monitor sats and BP, HR going into 140-160'son oxygen    Mobility  Bed Mobility Overal bed mobility: Needs Assistance Bed Mobility: Supine to Sit     Supine to sit: Min assist     General bed mobility comments: MinA required to lift trunk in bed . O2 was 91% sitting on EOB on 2L of O2.   Transfers Overall  transfer level: Needs assistance Equipment used: Rolling walker (2 wheeled) Transfers: Sit to/from Stand Sit to Stand: Min assist;Min guard         General transfer comment: VC's for UE placement.   Ambulation/Gait Ambulation/Gait assistance: Min guard Ambulation Distance (Feet): 30 Feet Assistive device: Rolling walker (2 wheeled) Gait Pattern/deviations: Step-through pattern;Trunk flexed;Decreased stride length Gait velocity: decreased Gait velocity interpretation: Below normal speed for age/gender General Gait Details: VCs for pacing and to utilize pursed lip breathing. Pt is on 2L of O2.  Sats dropped to 72% during ambulation, performed pursed lip breathing, sats increased to 78%. Increased to 4L of O2 and sats increased to 86%. Decreased distance secondary to O2 levels.   Stairs            Wheelchair Mobility    Modified Rankin (Stroke Patients Only)       Balance                                    Cognition Arousal/Alertness: Awake/alert Behavior During Therapy: WFL for tasks assessed/performed Overall Cognitive Status: Within Functional Limits for tasks assessed                      Exercises      General Comments        Pertinent Vitals/Pain      Home Living  Prior Function            PT Goals (current goals can now be found in the care plan section) Progress towards PT goals: Progressing toward goals    Frequency    Min 3X/week      PT Plan Current plan remains appropriate    Co-evaluation             End of Session Equipment Utilized During Treatment: Oxygen Activity Tolerance: Treatment limited secondary to medical complications (Comment) Patient left: with call bell/phone within reach;with bed alarm set;in chair     Time:  - 14:25 - 14:50    Charges:     1 gt  1 ta                   G CodesHall Busing, SPTA [facility] [phone]  Present and  agree with above  Rica Koyanagi  PTA [facility]  Acute  Rehab Pager      [phone]

## 2016-11-12 NOTE — Progress Notes (Signed)
Patient Name: David Barton Date of Encounter: 11/12/2016  Primary Cardiologist: Dr [facility] Problem List     Principal Problem:   Sepsis [name]) Active Problems:   Acute respiratory failure ([name])   COPD  GOLD II with reversibility   Essential hypertension   Community acquired pneumonia of right lung   Aspiration pneumonia ([name])   Diarrhea   AKI (acute kidney injury) ([place])   Hyperkalemia   Elevated troponin   Acute systolic CHF (congestive heart failure) ([name])   Cardiomyopathy ([name])   PSVT (paroxysmal supraventricular tachycardia) ([name])   Dysphagia   Renal insufficiency   Enteritis due to Clostridium difficile     Subjective   Breathing at baseline, no chest pain, slight wheeze is not unusual for him, no rescue inhaler at home  Inpatient Medications    Scheduled Meds: . aspirin EC  81 mg Oral q morning - 10a  . budesonide (PULMICORT) nebulizer solution  0.25 mg Nebulization BID  . carvedilol  6.25 mg Oral BID WC  . diltiazem  60 mg Oral Q8H  . enoxaparin (LOVENOX) injection  40 mg Subcutaneous Daily  . ferrous sulfate  325 mg Oral Q breakfast  . furosemide  20 mg Oral Daily  . guaiFENesin  600 mg Oral BID  . lisinopril  20 mg Oral Daily  . magnesium oxide  400 mg Oral BID  . mouth rinse  15 mL Mouth Rinse BID  . multivitamin with minerals  1 tablet Oral Daily  . potassium chloride  20 mEq Oral Daily  . saccharomyces boulardii  250 mg Oral BID  . sertraline  50 mg Oral Daily  . thiamine  100 mg Oral Daily  . tiotropium  18 mcg Inhalation Daily  . vancomycin  500 mg Oral Q6H  . vitamin B-12  1,000 mcg Oral Daily   Continuous Infusions:  PRN Meds: acetaminophen **OR** acetaminophen, hydrALAZINE, ondansetron **OR** ondansetron (ZOFRAN) IV, traMADol   Vital Signs    Vitals:   11/11/16 1829 11/11/16 2126 11/12/16 0545 11/12/16 0818  BP: 135/73 132/70 140/67 138/72  Pulse: 71 70 79 73  Resp:  17 18   Temp:  98 F (36.7 C) 98.5 F (36.9 C)     TempSrc:  Oral Oral   SpO2:  94% 91%   Weight:   234 lb 5.6 oz (106.3 kg)   Height:        Intake/Output Summary (Last 24 hours) at 11/12/16 1049 Last data filed at 11/12/16 0545  Gross per 24 hour  Intake              240 ml  Output             1300 ml  Net            -1060 ml   Filed Weights   11/10/16 0541 11/11/16 0517 11/12/16 0545  Weight: 238 lb 12.1 oz (108.3 kg) 238 lb 12.1 oz (108.3 kg) 234 lb 5.6 oz (106.3 kg)    Physical Exam    GEN: Well nourished, well developed, in no acute distress.  HEENT: Grossly normal.  Neck: Supple, no JVD seen but difficult to assess 2nd body habitus, no carotid bruits, or masses. Cardiac: RRR, no murmurs, rubs, or gallops. No clubbing, cyanosis, edema.  Radials/DP/PT 2+ and equal bilaterally.  Respiratory:  Respirations regular and unlabored, decreased BS bases bilaterally. Slight exp wheeze GI: Soft, nontender, nondistended, BS + x 4. MS: no deformity or atrophy. Skin: warm  and dry, no rash. Neuro:  Strength and sensation are intact. Psych: AAOx3.  Normal affect.  Labs    CBC  Recent Labs  11/11/16 0439 11/12/16 0358  WBC 5.5 5.5  NEUTROABS 3.3 3.5  HGB 11.0* 10.6*  HCT 34.8* 33.4*  MCV 107.4* 109.2*  PLT 226 093   Basic Metabolic Panel  Recent Labs  11/10/16 0331 11/11/16 0439 11/12/16 0358  NA 142 142  --   K 3.7 3.6  --   CL 109 108  --   CO2 26 25  --   GLUCOSE 105* 95  --   BUN 22* 22*  --   CREATININE 1.36* 1.23  --   CALCIUM 9.8 10.0  --   MG 1.7 1.5* 1.4*   Fasting Lipid Panel  Recent Labs  11/10/16 0331  CHOL 106  HDL 60  LDLCALC 29  TRIG 85  CHOLHDL 1.8   Thyroid Function Tests  Recent Labs  11/09/16 1153  TSH 5.734*    Telemetry    SR, ST no sig ectopy - Personally Reviewed  ECG    N/a - Personally Reviewed  Radiology    Dg Chest 2 View  Result Date: 11/12/2016 CLINICAL DATA:  Shortness of breath, fever and cough for 1 week, history hypertension, former smoker, COPD,  colon cancer EXAM: CHEST  2 VIEW COMPARISON:  11 20,017 FINDINGS: Upper normal heart size. Mediastinal contours and pulmonary vascularity normal. Atherosclerotic calcification aorta. Small bibasilar pleural effusions. Underlying emphysematous changes. No definite acute infiltrate or pneumothorax. Bones demineralized. IMPRESSION: COPD changes with bibasilar small pleural effusions. No definite acute infiltrate. Electronically Signed   By: Lavonia Dana M.D.   On: 11/12/2016 09:07    Cardiac Studies   No new ones  Patient Profile     82 y.o. malewith history of COPD, colon CA, alcohol abuse, former tobacco abuse, HTN, obesity, DNR [name]16 with generalized weakness, unable to ambulate, cough, and mild diarrhea - found to acute hpoxic respiratory failure, sepsis due to aspiration PNA, AKI with Cr 1.73, hyperkalemia 5.4, leukocytosis 14.8, mild macroytic anemia, and C diff colitis.   Assessment & Plan    1. Multiple medical issues including sepsis, C diff colitis, aspiration PNA, acute hypoxic respiratory failure - per IM.  2. Elevated troponin - suspect demand process in the setting of above. Has never been evaluated from ischemic standpoint. Denies any recent angina. Interestingly does have deep ear creases which have been a/w CAD.  Consider outpatient Lexiscan once recovered from acute illness. Lipids wnl. Continue ASA, BB, ACE  3. SVT - TSH mildly elevated but fT4 normal - will need f/u of this by primary care as OP. Advised d/c of alcohol.  - Mg level is suboptimal, on MagOx '400mg'$ , per IM, increased to bid - baseline HR never < 65, had 2.07 sec pause because of dropped PAC - no additional arrhythmia - increase Coreg to 12.5 mg bid and decrease Dilt to 30 mg q 8 hr - if HR/BP remain stable, ok to d/c dilt tomorrow.  4. Acute systolic CHF with new recognition of LV dysfunction - etiology unclear, could be viral/septic, related to alcohol use or possibly ischemic in nature.  Continue BB, ACEI and Lasix. - Discussed low sodium diet, daily weights, 2L fluid restriction with pt. Will rx CHF book and ask nutritionist to see for education. - continue BB & ACE - if BUN/Cr stable, increase   5. Alcohol abuse - patient is somewhat guarded when  discussing intake. Further management of withdrawal prevention per primary team.   6. Acute kidney injury - baseline unclear, normal in 2014. Seems to have leveled off 1.4-1.5. Per IM. Recheck in am  7. Hypomagnesemia - on Mag-Ox 400 mg, increased to bid - since Mg level low today, give 1 extra dose.   Jonetta Speak, PA-C  11/12/2016, 10:49 AM

## 2016-11-13 LAB — MAGNESIUM: Magnesium: 1.4 mg/dL — ABNORMAL LOW (ref 1.7–2.4)

## 2016-11-13 LAB — BASIC METABOLIC PANEL
ANION GAP: 5 (ref 5–15)
BUN: 22 mg/dL — ABNORMAL HIGH (ref 6–20)
CHLORIDE: 110 mmol/L (ref 101–111)
CO2: 28 mmol/L (ref 22–32)
Calcium: 9.9 mg/dL (ref 8.9–10.3)
Creatinine, Ser: 1.45 mg/dL — ABNORMAL HIGH (ref 0.61–1.24)
GFR calc non Af Amer: 44 mL/min — ABNORMAL LOW (ref >60)
GFR, EST AFRICAN AMERICAN: 51 mL/min — AB (ref >60)
GLUCOSE: 100 mg/dL — AB (ref 65–99)
Potassium: 4.1 mmol/L (ref 3.5–5.1)
Sodium: 143 mmol/L (ref 135–145)

## 2016-11-13 NOTE — Progress Notes (Signed)
PROGRESS NOTE  David Barton YQI:[id number] DOB: [birth year] DOA: 11/03/2016 PCP: Vena Austria, MD   Brief history: David Barton is a 82 y.o. male with medical history significant of Colon cancer,COPD, HTN who presents complaining of generalized weakness,  Was found to be have c diff diarrhea, and community acquired pneumonia. Now with SVT and found to have systolic CHF with EF 38-75%  Assessment/Plan: Principal Problem:   Sepsis ([place]) Active Problems:   Acute respiratory failure ([place])   COPD  GOLD II with reversibility   Essential hypertension   Community acquired pneumonia of right lung   Aspiration pneumonia ([name])   Diarrhea   AKI (acute kidney injury) ([place])   Hyperkalemia   Elevated troponin   Acute systolic CHF (congestive heart failure) ([name])   Cardiomyopathy ([name])   PSVT (paroxysmal supraventricular tachycardia) ([name])   Dysphagia   Renal insufficiency   Enteritis due to Clostridium difficile  Severe sepsis presented on admission with fever, leukocytosis, sinus tachycardia, hypoxia.  -Secondary to aspiration pneumonia and C diff diarrhea. He was started on IV antibiotics and currently on [place] oxygen to keep sats greater than 90%.  -Blood cultures done on admission and negative so far. Sputum cultures show normal respiratory flora.  -No further fever,  leukocytosis resolved and Lactic acid normalized.  -will continue weaning him off O2 supplementation as tolerated; continue assessing O2 sat on exertion to determine outpatient needs. -will now follow him off antibiotics, patient completed abx's treatment   Acute hypoxic respiratory failure: due to PNA and newly diagnosed systolic CHF with EF 64-33% -From sepsis and pna, currently on 3 liter; improving and currently stable -Rpeat CXR in am  -continue laxis low dose daily now -will continue to monitor.  -titrate O2 off as tolerated  -treatment to be adjusted by cardiology; will continue lisinopril and now  coreg (plan is to titrate off diltiazem if possible and continue titration up of B-blockers) -low sodium diet and daily weights and strict I's and O's  Cdiff: -With recent abx exposure for uti treatment by pmd, due to disease severity, he was started on high dose oral vanc in addition to oral flagyl, now that he has improved and diarrhea has slows down, will d/c flagyl -will d/c rectal tube  -continue florastor to help with diarrhea -plan is to treat for 14 days (currently day 4/14)  PNA: concerning for aspiration pneumonia -CT :"RIGHT lower lobe consolidation concerning for pneumonia, possibly secondary to aspiration. Diffuse mild bronchial wall thickening can be seen with reactive airway dose disease or bronchitis. Secretions in the trachea and bronchi consistent with aspiration". -has completed antibiotic therapy for PNA -Blood culture/sputum culture in process (essentially w/o growth), urine legionella antigen negative, urine strep pneumo antigen negative -Repeated CXR 2 views in am, with improved aeration, no infiltrates or consolidation seen -continue Flutter valve and IS  Dysphagia:  -swallow eval, DG esophagus ordered showed esophageal dysmotility. Recommend GI consult on discharge or when his pneumonia and his sepsis improves.   AKI : likely from dehydration and poor perfusion with sepsis, especially with low EF -will follow trend  -Cr stable in 1.4 range now (probably new baseline)  -will follow renal trend  COPD:  -not oxygen dependent at baseline, prior smoker.  -will continue weaning O2 as toelrated -resume spiriva and continue pulmicort (planning to discharge on symbicort) -mild wheezing only on exam and moving more air.   Daily alcohol use, denies h/o alcohol withdrawal problems, will monitor.  -No signs of withdrawal  symptoms. -will continue thiamine and B12 -patient with macrocytosis, most likely associated with alcohol consumption    Progressive weakness,  -will  need PT re-eval, may need to have spine imaging piror to discharge or to be scheduled by PCP, depends on patient's clinical condition, I have discussed this with patient and his wife, they expressed understanding. -will also continue B12 repletion   Hypertension:  -Stable currently -will monitor and adjust as needed; for now continue current antihypertensive regimen for now  PSVT; -Pt asymptomatic currently and rate controlled -on diltiazem -low EF appreciated on Echo -cardiology on board -elevated troponin and no CP, due to demand ischemia most likely -will continue to monitor and adjust medications as needed  -plan is to keep potassium >4 and Mg as close as possible to 2  Hypomagnesemia: -will follow trend and replete as needed  -goal is to keep around 2 -dose adjusted to '400mg'$  BID  Code Status: DNR  Family Communication:none at bedside, discussed with wife on 11/20.  Disposition Plan: to be determined; after re-evaluationa nd discussion with patient and wife, they will like to pursuit rehabilitation at SNF prior to go home. Will follow any further rec's from cardiology regarding medications adjustments. Will also continue treatment for C. Diff.   Consultants:  Cardiology  Procedures:  2-D echo  -with diffuse hypokinesis, EF 30-35%  Antibiotics:  Rocephin/zithro till 11/22  Oral vanc/flagyl (planning for 14 days treatment)   Objective: BP 132/66 (BP Location: Right Arm)   Pulse 74   Temp 98.1 F (36.7 C) (Oral)   Resp 18   Ht '5\' 10"'$  (1.778 m)   Wt 106.2 kg (234 lb 2.1 oz)   SpO2 97%   BMI 33.59 kg/m   Intake/Output Summary (Last 24 hours) at 11/13/16 1247 Last data filed at 11/13/16 0200  Gross per 24 hour  Intake               50 ml  Output              700 ml  Net             -650 ml   Filed Weights   11/11/16 0517 11/12/16 0545 11/13/16 0500  Weight: 108.3 kg (238 lb 12.1 oz) 106.3 kg (234 lb 5.6 oz) 106.2 kg (234 lb 2.1 oz)     Exam:   General:  NAD; denies CP; patient with needs for 4 L after walking yesterday. Afebrile and feeling better overall. Swelling continue to improved. No fever and denies N/V; also with just 2 loose stools per day now.  Cardiovascular: RRR, no murmurs   Respiratory: diminished at bases, positive mild wheezing. Overall improved air movement   Abdomen: Soft/ND/NT, positive BS   Musculoskeletal: trace Edema on LE and positive trace on his upper extremities bilaterally  Neuro: aaox3  Data Reviewed: Basic Metabolic Panel:  Recent Labs Lab 11/08/16 1806 11/09/16 0620 11/10/16 0331 11/11/16 0439 11/12/16 0358 11/13/16 0526  NA 141 140 142 142  --  143  K 4.3 3.8 3.7 3.6  --  4.1  CL 109 108 109 108  --  110  CO2 '24 23 26 25  '$ --  28  GLUCOSE 125* 112* 105* 95  --  100*  BUN 23* 24* 22* 22*  --  22*  CREATININE 1.47* 1.51* 1.36* 1.23  --  1.45*  CALCIUM 9.7 9.6 9.8 10.0  --  9.9  MG 1.4*  --  1.7 1.5* 1.4* 1.4*   CBC:  Recent Labs Lab 11/08/16 0513 11/09/16 0620 11/10/16 0331 11/11/16 0439 11/12/16 0358  WBC 8.4 6.8 5.5 5.5 5.5  NEUTROABS 6.4 4.6 3.3 3.3 3.5  HGB 11.3* 11.4* 11.2* 11.0* 10.6*  HCT 35.8* 35.2* 34.6* 34.8* 33.4*  MCV 112.2* 109.0* 109.1* 107.4* 109.2*  PLT 201 198 204 226 223   Cardiac Enzymes:    Recent Labs Lab 11/08/16 1821 11/09/16 0008 11/09/16 0620  TROPONINI 0.18* 0.14* 0.11*   BNP (last 3 results)  Recent Labs  11/10/16 0331  BNP 716.9*   CBG: No results for input(s): GLUCAP in the last 168 hours.  Recent Results (from the past 240 hour(s))  Urine culture     Status: None   Collection Time: 11/03/16  6:00 PM  Result Value Ref Range Status   Specimen Description URINE, CLEAN CATCH  Final   Special Requests NONE  Final   Culture NO GROWTH Performed at [facility]   Final   Report Status 11/05/2016 FINAL  Final  Culture, blood (Routine x 2)     Status: None   Collection Time: 11/03/16  6:49 PM  Result Value  Ref Range Status   Specimen Description BLOOD LEFT ANTECUBITAL  Final   Special Requests BOTTLES DRAWN AEROBIC AND ANAEROBIC 5CC  Final   Culture   Final    NO GROWTH 5 DAYS Performed at [facility]    Report Status 11/08/2016 FINAL  Final  Culture, blood (Routine x 2)     Status: None   Collection Time: 11/03/16  7:20 PM  Result Value Ref Range Status   Specimen Description BLOOD LEFT FOREARM  Final   Special Requests BOTTLES DRAWN AEROBIC AND ANAEROBIC 5ML  Final   Culture   Final    NO GROWTH 5 DAYS Performed at [facility]    Report Status 11/08/2016 FINAL  Final  MRSA PCR Screening     Status: None   Collection Time: 11/04/16 12:53 AM  Result Value Ref Range Status   MRSA by PCR NEGATIVE NEGATIVE Final    Comment:        The GeneXpert MRSA Assay (FDA approved for NASAL specimens only), is one component of a comprehensive MRSA colonization surveillance program. It is not intended to diagnose MRSA infection nor to guide or monitor treatment for MRSA infections.   C difficile quick scan w PCR reflex     Status: Abnormal   Collection Time: 11/04/16  2:59 AM  Result Value Ref Range Status   C Diff antigen POSITIVE (A) NEGATIVE Final   C Diff toxin POSITIVE (A) NEGATIVE Final    Comment: CRITICAL RESULT CALLED TO, READ BACK BY AND VERIFIED WITH: M.STREBLE,RN [id number] 11/04/16 W.SHEA    C Diff interpretation Toxin producing C. difficile detected.  Final    Comment: CRITICAL RESULT CALLED TO, READ BACK BY AND VERIFIED WITH: M.STREBLE,RN [id number] 11/04/16 W.SHEA   Culture, sputum-assessment     Status: None   Collection Time: 11/04/16  6:34 PM  Result Value Ref Range Status   Specimen Description EXPECTORATED SPUTUM  Final   Special Requests NONE  Final   Sputum evaluation   Final    THIS SPECIMEN IS ACCEPTABLE. RESPIRATORY CULTURE REPORT TO FOLLOW.   Report Status 11/04/2016 FINAL  Final  Culture, respiratory (NON-Expectorated)     Status: None   Collection  Time: 11/04/16  6:34 PM  Result Value Ref Range Status   Specimen Description SPUTUM  Final   Special Requests NONE  Final   Gram Stain   Final    MODERATE WBC PRESENT, PREDOMINANTLY PMN MODERATE GRAM POSITIVE COCCI IN PAIRS FEW GRAM NEGATIVE RODS FEW GRAM VARIABLE ROD RARE YEAST    Culture   Final    Consistent with normal respiratory flora. Performed at [facility]    Report Status 11/07/2016 FINAL  Final     Studies: No results found.  Scheduled Meds: . aspirin EC  81 mg Oral q morning - 10a  . budesonide (PULMICORT) nebulizer solution  0.25 mg Nebulization BID  . carvedilol  12.5 mg Oral BID WC  . diltiazem  30 mg Oral Q8H  . enoxaparin (LOVENOX) injection  40 mg Subcutaneous Daily  . ferrous sulfate  325 mg Oral Q breakfast  . furosemide  20 mg Oral Daily  . guaiFENesin  600 mg Oral BID  . lisinopril  20 mg Oral Daily  . magnesium oxide  400 mg Oral BID  . mouth rinse  15 mL Mouth Rinse BID  . multivitamin with minerals  1 tablet Oral Daily  . potassium chloride  20 mEq Oral Daily  . saccharomyces boulardii  250 mg Oral BID  . sertraline  50 mg Oral Daily  . thiamine  100 mg Oral Daily  . tiotropium  18 mcg Inhalation Daily  . vancomycin  500 mg Oral Q6H  . vitamin B-12  1,000 mcg Oral Daily    Time spent: 30 minutes.   Barton Dubois MD. [facility] Pager [phone] If 7PM-7AM, please contact night-coverage at [email or web address], password [facility] 11/13/2016, 12:47 PM  LOS: 10 days

## 2016-11-14 DIAGNOSIS — I429 Cardiomyopathy, unspecified: Secondary | ICD-10-CM

## 2016-11-14 DIAGNOSIS — G4730 Sleep apnea, unspecified: Secondary | ICD-10-CM

## 2016-11-14 DIAGNOSIS — J9601 Acute respiratory failure with hypoxia: Secondary | ICD-10-CM

## 2016-11-14 LAB — BASIC METABOLIC PANEL
ANION GAP: 5 (ref 5–15)
BUN: 23 mg/dL — ABNORMAL HIGH (ref 6–20)
CO2: 28 mmol/L (ref 22–32)
Calcium: 10.2 mg/dL (ref 8.9–10.3)
Chloride: 109 mmol/L (ref 101–111)
Creatinine, Ser: 1.36 mg/dL — ABNORMAL HIGH (ref 0.61–1.24)
GFR calc Af Amer: 55 mL/min — ABNORMAL LOW (ref >60)
GFR, EST NON AFRICAN AMERICAN: 48 mL/min — AB (ref >60)
GLUCOSE: 107 mg/dL — AB (ref 65–99)
POTASSIUM: 4.5 mmol/L (ref 3.5–5.1)
Sodium: 142 mmol/L (ref 135–145)

## 2016-11-14 LAB — MAGNESIUM: Magnesium: 1.4 mg/dL — ABNORMAL LOW (ref 1.7–2.4)

## 2016-11-14 MED ORDER — MAGNESIUM SULFATE 2 GM/50ML IV SOLN
2.0000 g | Freq: Once | INTRAVENOUS | Status: AC
  Administered 2016-11-14: 2 g via INTRAVENOUS
  Filled 2016-11-14: qty 50

## 2016-11-14 MED ORDER — BISOPROLOL FUMARATE 5 MG PO TABS
10.0000 mg | ORAL_TABLET | Freq: Every day | ORAL | Status: DC
  Administered 2016-11-14 – 2016-11-20 (×7): 10 mg via ORAL
  Filled 2016-11-14 (×7): qty 2

## 2016-11-14 MED ORDER — LISINOPRIL 20 MG PO TABS
40.0000 mg | ORAL_TABLET | Freq: Every day | ORAL | Status: DC
  Administered 2016-11-14 – 2016-11-20 (×7): 40 mg via ORAL
  Filled 2016-11-14 (×7): qty 2

## 2016-11-14 MED ORDER — FUROSEMIDE 20 MG PO TABS
20.0000 mg | ORAL_TABLET | Freq: Two times a day (BID) | ORAL | Status: DC
  Administered 2016-11-14 – 2016-11-17 (×7): 20 mg via ORAL
  Filled 2016-11-14 (×7): qty 1

## 2016-11-14 NOTE — Progress Notes (Signed)
MD Dyann Kief paged to be made aware of patient's 2.67 pause on telemetry. No new orders. Pt asymptomatic, VSS. Pt sitting up in chair, resting comfortably. Will continue to monitor.

## 2016-11-14 NOTE — Progress Notes (Signed)
PROGRESS NOTE  David Barton QIH:[id number] DOB: Oct 11, 1933 DOA: 11/03/2016 PCP: Vena Austria, MD   Brief history: David Barton is a 82 y.o. male with medical history significant of Colon cancer,COPD, HTN who presents complaining of generalized weakness,  Was found to be have c diff diarrhea, and community acquired pneumonia. Now with SVT and found to have systolic CHF with EF 87-56%  Assessment/Plan: Principal Problem:   Sepsis ([place]) Active Problems:   Acute respiratory failure ([place])   COPD  GOLD II with reversibility   Essential hypertension   Community acquired pneumonia of right lung   Aspiration pneumonia ([name])   Diarrhea   AKI (acute kidney injury) ([place])   Hyperkalemia   Elevated troponin   Acute systolic CHF (congestive heart failure) ([name])   Cardiomyopathy ([name])   PSVT (paroxysmal supraventricular tachycardia) ([name])   Dysphagia   Renal insufficiency   Enteritis due to Clostridium difficile  Severe sepsis presented on admission with fever, leukocytosis, sinus tachycardia, hypoxia.  -Secondary to aspiration pneumonia and C diff diarrhea. He was started on IV antibiotics and currently on [place] oxygen to keep sats greater than 90%.  -Blood cultures done on admission and negative so far. Sputum cultures show normal respiratory flora.  -No further fever,  leukocytosis resolved and Lactic acid normalized.  -will continue weaning him off O2 supplementation as tolerated; continue assessing O2 sat on exertion to determine outpatient needs. -will now follow him off antibiotics, patient completed abx's treatment   Acute hypoxic respiratory failure: due to PNA and newly diagnosed systolic CHF with EF 43-32% -From sepsis and pna, currently on 3 liter; improving and currently stable -will continue to monitor.  -titrate O2 off as tolerated  -treatment to be adjusted by cardiology; will continue lisinopril (dose adjusted on 11/26), now Bisoprolol and discontinuation of  diltiazem. Will also continue lasix '20mg'$  BID -low sodium diet and daily weights and strict I's and O's  Presumed sleep apnea -as per wife description, not new -will start auto-titration trial with CPAP at night time -patient will need sleep study as an outpatient    Cdiff: -With recent abx exposure for uti treatment by pmd, due to disease severity, he was started on high dose oral vanc in addition to oral flagyl, now that he has improved and diarrhea has slows down, will d/c flagyl -will d/c rectal tube  -continue florastor to help with diarrhea -plan is to treat for 14 days (currently day 5/14)  PNA: concerning for aspiration pneumonia -CT :"RIGHT lower lobe consolidation concerning for pneumonia, possibly secondary to aspiration. Diffuse mild bronchial wall thickening can be seen with reactive airway dose disease or bronchitis. Secretions in the trachea and bronchi consistent with aspiration". -has completed antibiotic therapy for PNA -Blood culture/sputum culture in process (essentially w/o growth), urine legionella antigen negative, urine strep pneumo antigen negative -Repeated CXR 2 views in am, with improved aeration, no infiltrates or consolidation seen -continue Flutter valve and IS  Dysphagia:  -swallow eval, DG esophagus ordered showed esophageal dysmotility. Recommend GI consult on discharge or when his pneumonia and his sepsis improves.   AKI :  -likely from dehydration and poor perfusion with sepsis, especially with low EF -will follow trend  -Cr stable in 1.3-1.4 range now (probably new baseline)  -will follow renal trend  COPD:  -not oxygen dependent at baseline, prior smoker.  -will continue weaning O2 as toelrated -resume spiriva and continue pulmicort (planning to discharge on symbicort) -mild wheezing only on exam and moving more  air.   Daily alcohol use, denies h/o alcohol withdrawal problems, will monitor.  -No signs of withdrawal symptoms. -will continue  thiamine and B12 -patient with macrocytosis, most likely associated with alcohol consumption   -cessation counseling provided  Progressive weakness,  -will need PT re-eval, may need to have spine imaging piror to discharge or to be scheduled by PCP, depends on patient's clinical condition, I have discussed this with patient and his wife, they expressed understanding. -will also continue B12 repletion   Hypertension:  -Stable currently -will monitor and adjust as needed; for now continue current antihypertensive regimen for now  PSVT; -Pt asymptomatic currently and rate controlled -on diltiazem -low EF appreciated on Echo -cardiology on board -elevated troponin and no CP, due to demand ischemia most likely -will continue to monitor and adjust medications as needed  -plan is to keep potassium >4 and Mg as close as possible to 2  Hypomagnesemia: -will follow trend and replete as needed  -goal is to keep around 2 -dose adjusted to '400mg'$  BID and extra Mg given today 11/26 (IV)  Code Status: DNR  Family Communication:none at bedside, discussed with wife on 11/20.  Disposition Plan: to be determined; after re-evaluation and discussion with patient and wife, they will like to pursuit rehabilitation at SNF prior to go home Vs private RN and [facility] services (depnding on his options); CM and SW consulted. Will follow any further rec's from cardiology regarding medications adjustments. Will also continue treatment for C. Diff.   Consultants:  Cardiology  Procedures:  2-D echo: With diffuse hypokinesis, EF 30-35%  Antibiotics:  Rocephin/zithro till 11/22  Oral vanc/flagyl (planning for 14 days treatment)   Objective: BP 112/62 (BP Location: Right Arm)   Pulse 69   Temp 98.2 F (36.8 C) (Oral)   Resp 16   Ht '5\' 10"'$  (1.778 m)   Wt 105 kg (231 lb 7.7 oz)   SpO2 97%   BMI 33.21 kg/m   Intake/Output Summary (Last 24 hours) at 11/14/16 1838 Last data filed at 11/14/16 0535   Gross per 24 hour  Intake              160 ml  Output              950 ml  Net             -790 ml   Filed Weights   11/12/16 0545 11/13/16 0500 11/14/16 1700  Weight: 106.3 kg (234 lb 5.6 oz) 106.2 kg (234 lb 2.1 oz) 105 kg (231 lb 7.7 oz)    Exam:   General:  NAD; denies CP; patient with episode of desaturation overnight and hypoxia requiring venturi mask. Afebrile and feeling better overall. Swelling continue to improved. No fever and denies N/V; also with just 1-2 loose stools per day now.  Cardiovascular: RRR, no murmurs   Respiratory: diminished at bases, with very mild exp wheezing. Overall improved air movement   Abdomen: Soft/ND/NT, positive BS   Musculoskeletal: trace Edema on LE and positive trace on his upper extremities bilaterally  Neuro: aaox3  Data Reviewed: Basic Metabolic Panel:  Recent Labs Lab 11/09/16 0620 11/10/16 0331 11/11/16 0439 11/12/16 0358 11/13/16 0526 11/14/16 0529  NA 140 142 142  --  143 142  K 3.8 3.7 3.6  --  4.1 4.5  CL 108 109 108  --  110 109  CO2 '23 26 25  '$ --  28 28  GLUCOSE 112* 105* 95  --  100* 107*  BUN 24* 22* 22*  --  22* 23*  CREATININE 1.51* 1.36* 1.23  --  1.45* 1.36*  CALCIUM 9.6 9.8 10.0  --  9.9 10.2  MG  --  1.7 1.5* 1.4* 1.4* 1.4*   CBC:  Recent Labs Lab 11/08/16 0513 11/09/16 0620 11/10/16 0331 11/11/16 0439 11/12/16 0358  WBC 8.4 6.8 5.5 5.5 5.5  NEUTROABS 6.4 4.6 3.3 3.3 3.5  HGB 11.3* 11.4* 11.2* 11.0* 10.6*  HCT 35.8* 35.2* 34.6* 34.8* 33.4*  MCV 112.2* 109.0* 109.1* 107.4* 109.2*  PLT 201 198 204 226 223   Cardiac Enzymes:    Recent Labs Lab 11/08/16 1821 11/09/16 0008 11/09/16 0620  TROPONINI 0.18* 0.14* 0.11*   BNP (last 3 results)  Recent Labs  11/10/16 0331  BNP 716.9*   Studies: No results found.  Scheduled Meds: . aspirin EC  81 mg Oral q morning - 10a  . bisoprolol  10 mg Oral Daily  . budesonide (PULMICORT) nebulizer solution  0.25 mg Nebulization BID  . enoxaparin  (LOVENOX) injection  40 mg Subcutaneous Daily  . ferrous sulfate  325 mg Oral Q breakfast  . furosemide  20 mg Oral BID  . guaiFENesin  600 mg Oral BID  . lisinopril  40 mg Oral Daily  . magnesium oxide  400 mg Oral BID  . mouth rinse  15 mL Mouth Rinse BID  . multivitamin with minerals  1 tablet Oral Daily  . potassium chloride  20 mEq Oral Daily  . saccharomyces boulardii  250 mg Oral BID  . sertraline  50 mg Oral Daily  . thiamine  100 mg Oral Daily  . tiotropium  18 mcg Inhalation Daily  . vancomycin  500 mg Oral Q6H  . vitamin B-12  1,000 mcg Oral Daily    Time spent: 30 minutes.   Barton Dubois MD. [facility] Pager [phone] If 7PM-7AM, please contact night-coverage at [email or web address], password [facility] 11/14/2016, 6:38 PM  LOS: 11 days

## 2016-11-14 NOTE — Progress Notes (Signed)
Patient Name: David Barton Date of Encounter: 11/14/2016  Primary Cardiologist: [facility] Problem List     Principal Problem:   Sepsis [facility]) Active Problems:   Acute respiratory failure ([place])   COPD  GOLD II with reversibility   Essential hypertension   Community acquired pneumonia of right lung   Aspiration pneumonia ([name])   Diarrhea   AKI (acute kidney injury) ([place])   Hyperkalemia   Elevated troponin   Acute systolic CHF (congestive heart failure) ([name])   Cardiomyopathy ([name])   PSVT (paroxysmal supraventricular tachycardia) ([name])   Dysphagia   Renal insufficiency   Enteritis due to Clostridium difficile     Subjective   He remains weak, wheezing, requiring relatively high oxygen supplementation  Inpatient Medications    Scheduled Meds: . aspirin EC  81 mg Oral q morning - 10a  . bisoprolol  10 mg Oral Daily  . budesonide (PULMICORT) nebulizer solution  0.25 mg Nebulization BID  . enoxaparin (LOVENOX) injection  40 mg Subcutaneous Daily  . ferrous sulfate  325 mg Oral Q breakfast  . furosemide  20 mg Oral BID  . guaiFENesin  600 mg Oral BID  . lisinopril  20 mg Oral Daily  . magnesium oxide  400 mg Oral BID  . magnesium sulfate 1 - 4 g bolus IVPB  2 g Intravenous Once  . mouth rinse  15 mL Mouth Rinse BID  . multivitamin with minerals  1 tablet Oral Daily  . potassium chloride  20 mEq Oral Daily  . saccharomyces boulardii  250 mg Oral BID  . sertraline  50 mg Oral Daily  . thiamine  100 mg Oral Daily  . tiotropium  18 mcg Inhalation Daily  . vancomycin  500 mg Oral Q6H  . vitamin B-12  1,000 mcg Oral Daily   Continuous Infusions:  PRN Meds: acetaminophen **OR** acetaminophen, hydrALAZINE, ondansetron **OR** ondansetron (ZOFRAN) IV, traMADol   Vital Signs    Vitals:   11/14/16 0440 11/14/16 0534 11/14/16 0853 11/14/16 0856  BP: 138/70     Pulse: 85     Resp: 18     Temp: 98.4 F (36.9 C)     TempSrc: Oral     SpO2: 90% 94% 91% 91%    Weight:      Height:        Intake/Output Summary (Last 24 hours) at 11/14/16 0941 Last data filed at 11/14/16 0535  Gross per 24 hour  Intake              160 ml  Output              950 ml  Net             -790 ml   Filed Weights   11/11/16 0517 11/12/16 0545 11/13/16 0500  Weight: 238 lb 12.1 oz (108.3 kg) 234 lb 5.6 oz (106.3 kg) 234 lb 2.1 oz (106.2 kg)    Physical Exam   Frail GEN: Well nourished, well developed, in no acute distress.  HEENT: Grossly normal.  Neck: Supple, no JVD, carotid bruits, or masses. Cardiac: RRR, no murmurs, rubs, or gallops. No clubbing, cyanosis, edema.  Radials/DP/PT 2+ and equal bilaterally.  Respiratory:  Diminished breath sounds, wheezing a little , Respirations regular and unlabored GI: Soft, nontender, nondistended, BS + x 4. MS: no deformity or atrophy. Skin: warm and dry, no rash. Neuro:  Strength and sensation are intact. Psych: AAOx3.  Normal affect.  Labs  CBC  Recent Labs  11/12/16 0358  WBC 5.5  NEUTROABS 3.5  HGB 10.6*  HCT 33.4*  MCV 109.2*  PLT 041   Basic Metabolic Panel  Recent Labs  11/13/16 0526 11/14/16 0529  NA 143 142  K 4.1 4.5  CL 110 109  CO2 28 28  GLUCOSE 100* 107*  BUN 22* 23*  CREATININE 1.45* 1.36*  CALCIUM 9.9 10.2  MG 1.4* 1.4*     Telemetry    Sinus rhythm, no SVT seen in 48 hours - Personally Reviewed  Radiology    No results found.  Cardiac Studies   Echo 11/09/16 - Left ventricle: The cavity size was normal. Systolic function was   moderately to severely reduced. The estimated ejection fraction   was in the range of 30% to 35%. Regional wall motion   abnormalities cannot be excluded. The study is not technically   sufficient to allow evaluation of LV diastolic function. - Mitral valve: Calcified annulus.  Impressions:  - Technically difficult; definity used; EF difficult to quantitate   but likely moderate to severely reduced; suggest MUGA or cardiac   MRI to  further assess.  Patient Profile     82 y.o. malewith history of COPD, colon CA, alcohol abuse, former tobacco abuse, HTN, obesity, DNR presented to [facility] 11/16 with generalized weakness, unable to ambulate, cough, and mild diarrhea - found to acute hpoxic respiratory failure, sepsis due to aspiration PNA, AKI with Cr 1.73, hyperkalemia 5.4, leukocytosis 14.8, mild macroytic anemia, and C diff colitis. Echo showed left ventricular ejection fraction 30-35% and cisplatin quality echo).  Assessment & Plan    1. Sepsis, C diff colitis, aspiration PNA, acute hypoxic respiratory failure - still with hypoxia. 2. COPD: will switch from carvedilol to more beta-1 specific bisoprolol 3. SVT: no recurrence last 48 h.  4. Acute systolic CHF with new recognition of LV dysfunction - etiology unclear, could be related to the acute illness or chronic due to alcohol use or possible ischemia/CAD. Continue BB, ACEi and diuretics. DC diltiazem and increase lisinopril dose today. Discussed low sodium diet, daily weights, signs and symptoms of heart failure exacerbation. 5. Elevated troponin - suspect demand ischemia, but he has never been evaluated from ischemic standpoint. Denies any recent angina. Plan outpatient [place] once recovered from acute illness. Lipids wnl. Continue ASA, BB, ACEi.  6. Alcohol abuse: Unclear if this is contributing to his cardiomyopathy.  ideally, he should abstain from any alcohol intake until etiology is clarified. 7. Hypomagnesemia: Receiving more supplement today.   Signed, Sanda Klein, MD  11/14/2016, 9:41 AM

## 2016-11-14 NOTE — Progress Notes (Addendum)
Patient desat to 80% on 6 L of oxygen while sleeping. RT called and instructed RN to place patient on venturi mask 40% Fio2. Mask placed. Patient verbally understood purpose of mask and is tolerating well. Pt is now at 93% on venturi mask while at rest. Will continue to monitor closely.

## 2016-11-15 DIAGNOSIS — I420 Dilated cardiomyopathy: Secondary | ICD-10-CM

## 2016-11-15 LAB — BASIC METABOLIC PANEL
Anion gap: 7 (ref 5–15)
BUN: 23 mg/dL — AB (ref 6–20)
CALCIUM: 10.5 mg/dL — AB (ref 8.9–10.3)
CO2: 27 mmol/L (ref 22–32)
CREATININE: 1.24 mg/dL (ref 0.61–1.24)
Chloride: 107 mmol/L (ref 101–111)
GFR calc Af Amer: >60 mL/min (ref >60)
GFR, EST NON AFRICAN AMERICAN: 53 mL/min — AB (ref >60)
GLUCOSE: 98 mg/dL (ref 65–99)
POTASSIUM: 4.7 mmol/L (ref 3.5–5.1)
SODIUM: 141 mmol/L (ref 135–145)

## 2016-11-15 LAB — MAGNESIUM: MAGNESIUM: 1.7 mg/dL (ref 1.7–2.4)

## 2016-11-15 MED ORDER — MAGNESIUM SULFATE 2 GM/50ML IV SOLN
2.0000 g | Freq: Once | INTRAVENOUS | Status: AC
  Administered 2016-11-15: 2 g via INTRAVENOUS
  Filled 2016-11-15: qty 50

## 2016-11-15 NOTE — Care Management Note (Signed)
Case Management Note  Patient Details  Name: MARLAN STEWARD MRN: [id number] Date of Birth: 04-17-1933  Subjective/Objective: CM/CSW poke to patient/spouse in room about HHC-(chose [facility]) vs SNF-explained in detail HHC intermittent services,insurance-recc HHRN/PT/OT/aide/CSW;CSW explained SNF process, services,insurance. Spouse mrs. Arney provided c#[phone] so [facility] can call her-[facility] rep [name] aware to call. Spouse very concerned about the name"Nursing home", she will decide which d/c plan after CSW provides w/listing of places, & after discussion w/[facility] about benefits.                  Action/Plan:current d/c plan home.   Expected Discharge Date:   (unknown)               Expected Discharge Plan:  [place]  In-House Referral:  Clinical Social Work  Discharge planning Services  CM Consult  Post Acute Care Choice:    Choice offered to:  Patient  DME Arranged:    DME Agency:     HH Arranged:    [place] Agency:  [place]  Status of Service:  In process, will continue to follow  If discussed at Long Length of Stay Meetings, dates discussed:    Additional Comments:  Dessa Phi, RN 11/15/2016, 11:25 AM

## 2016-11-15 NOTE — Evaluation (Signed)
Occupational Therapy Evaluation Patient Details Name: David Barton MRN: [id number] DOB: 1933-02-08 Today's Date: 11/15/2016    History of Present Illness  David Barton is a 82 y.o. male with medical history significant of Colon cancer,COPD, HTN who presents complaining of generalized weakness, unable to ambulate, diarrhea.     CT angio: RIGHT lower lobe consolidation concerning for pneumonia, possibly secondary to aspiration. Diffuse mild bronchial wall thickening can be seen with reactive airway dose disease or bronchitis. Secretions in the trachea and bronchi consistent with aspiration    Clinical Impression   Pt admitted with weakness. Pt currently with functional limitations due to the deficits listed below (see OT Problem List).  Pt will benefit from skilled OT to increase their safety and independence with ADL and functional mobility for ADL to facilitate discharge to venue listed below.      Follow Up Recommendations  SNF;Home health OT;Supervision/Assistance - 24 hour    Equipment Recommendations  3 in 1 bedside comode    Recommendations for Other Services       Precautions / Restrictions Precautions Precautions: Fall      Mobility Bed Mobility Overal bed mobility: Needs Assistance Bed Mobility: Sit to Supine     Supine to sit: Min assist Sit to supine: Min assist      Transfers Overall transfer level: Needs assistance Equipment used: Rolling walker (2 wheeled) Transfers: Sit to/from [facility] Sit to Stand: Min assist Stand pivot transfers: Min assist                 ADL Overall ADL's : Needs assistance/impaired Eating/Feeding: Set up;Sitting   Grooming: Set up;Sitting   Upper Body Bathing: Minimal assitance;Sitting   Lower Body Bathing: Moderate assistance;Cueing for safety;Cueing for sequencing;Sit to/from stand   Upper Body Dressing : Minimal assistance;Sitting   Lower Body Dressing: Moderate assistance;Sit  to/from stand;Cueing for safety;Cueing for sequencing   Toilet Transfer: Stand-pivot;Minimal assistance Toilet Transfer Details (indicate cue type and reason): chair to bed Toileting- Clothing Manipulation and Hygiene: Minimal assistance;Cueing for sequencing;Cueing for safety;Sit to/from stand         General ADL Comments: Pt sitting in chair upon OT arrival. Pt had been up for several hours. Pt requesting to go back to bed as he was fatigued.  Discussed ADL activiity and need for A currently.  Pt would need 24/7 A at home or SNF at this time               Pertinent Vitals/Pain Pain Assessment: No/denies pain     Hand Dominance     Extremity/Trunk Assessment Upper Extremity Assessment Upper Extremity Assessment: Generalized weakness           Communication Communication Communication: No difficulties   Cognition Arousal/Alertness: Awake/alert Behavior During Therapy: WFL for tasks assessed/performed Overall Cognitive Status: Within Functional Limits for tasks assessed                                Home Living Family/patient expects to be discharged to:: Private residence Living Arrangements: Spouse/significant other Available Help at Discharge: Family Type of Home: House Home Access: Stairs to enter     Home Layout: One level               Home Equipment: None          Prior Functioning/Environment Level of Independence: Independent  OT Problem List: Decreased strength;Decreased activity tolerance   OT Treatment/Interventions: Self-care/ADL training;DME and/or AE instruction;Patient/family education    OT Goals(Current goals can be found in the care plan section) Acute Rehab OT Goals Patient Stated Goal: to get stronger OT Goal Formulation: With patient Time For Goal Achievement: 11/22/16  OT Frequency: Min 2X/week   Barriers to D/C:               End of Session Nurse Communication: Mobility  status  Activity Tolerance: Patient tolerated treatment well Patient left: in chair   Time: 1520-1544 OT Time Calculation (min): 24 min Charges:  OT General Charges $OT Visit: 1 Procedure OT Evaluation $OT Eval Moderate Complexity: 1 Procedure OT Treatments $Self Care/Home Management : 8-22 mins G-Codes:    Payton Mccallum D 2016-11-29, 3:58 PM

## 2016-11-15 NOTE — Clinical Social Work Placement (Signed)
CSW & RNCM, Juliann Pulse spoke with patient & wife, [name] at bedside re: discharge planning. Patient's wife is undecided at the moment re: home with home health vs. SNF. Patient & wife agreed to have information sent out to [facility] for bed offers - will follow-up with SNF availability.   Raynaldo Opitz, [facility] Clinical Social Worker cell #: [phone]   CLINICAL SOCIAL WORK PLACEMENT  NOTE  Date:  11/15/2016  Patient Details  Name: David Barton MRN: [id number] Date of Birth: March 05, 1933  Clinical Social Work is seeking post-discharge placement for this patient at the [place] level of care (*CSW will initial, date and re-position this form in  chart as items are completed):  Yes   Patient/family provided with [place] Work Department's list of facilities offering this level of care within the geographic area requested by the patient (or if unable, by the patient's family).  Yes   Patient/family informed of their freedom to choose among providers that offer the needed level of care, that participate in Medicare, Medicaid or managed care program needed by the patient, have an available bed and are willing to accept the patient.  Yes   Patient/family informed of [facility]'s ownership interest in [facility] and [facility], as well as of the fact that they are under no obligation to receive care at these facilities.  PASRR submitted to EDS on 11/15/16     PASRR number received on 11/15/16     Existing PASRR number confirmed on       FL2 transmitted to all facilities in geographic area requested by pt/family on 11/15/16     FL2 transmitted to all facilities within larger geographic area on       Patient informed that his/her managed care company has contracts with or will negotiate with certain facilities, including the following:            Patient/family informed of bed offers received.  Patient  chooses bed at       Physician recommends and patient chooses bed at      Patient to be transferred to   on  .  Patient to be transferred to facility by       Patient family notified on   of transfer.  Name of family member notified:        PHYSICIAN       Additional Comment:    _______________________________________________ Standley Brooking, LCSW 11/15/2016, 11:11 AM

## 2016-11-15 NOTE — Progress Notes (Signed)
Patient Name: David Barton Date of Encounter: 11/15/2016  Primary Cardiologist: Dr. Serena Croissant Problem List     Principal Problem:   Sepsis [facility]) Active Problems:   Acute respiratory failure ([place])   COPD  GOLD II with reversibility   Essential hypertension   Community acquired pneumonia of right lung   Aspiration pneumonia ([name])   Diarrhea   AKI (acute kidney injury) ([place])   Hyperkalemia   Elevated troponin   Acute systolic CHF (congestive heart failure) ([name])   Cardiomyopathy ([name])   PSVT (paroxysmal supraventricular tachycardia) ([name])   Dysphagia   Renal insufficiency   Enteritis due to Clostridium difficile   Sleep apnea    Subjective   Feeling better by the day. No CP. SOB improving. Continues to get hypoxic QHS, maintained on [place] at this time.  Inpatient Medications    . aspirin EC  81 mg Oral q morning - 10a  . bisoprolol  10 mg Oral Daily  . budesonide (PULMICORT) nebulizer solution  0.25 mg Nebulization BID  . enoxaparin (LOVENOX) injection  40 mg Subcutaneous Daily  . ferrous sulfate  325 mg Oral Q breakfast  . furosemide  20 mg Oral BID  . guaiFENesin  600 mg Oral BID  . lisinopril  40 mg Oral Daily  . magnesium oxide  400 mg Oral BID  . mouth rinse  15 mL Mouth Rinse BID  . multivitamin with minerals  1 tablet Oral Daily  . saccharomyces boulardii  250 mg Oral BID  . sertraline  50 mg Oral Daily  . thiamine  100 mg Oral Daily  . tiotropium  18 mcg Inhalation Daily  . vancomycin  500 mg Oral Q6H  . vitamin B-12  1,000 mcg Oral Daily    Vital Signs    Vitals:   11/14/16 2050 11/14/16 2104 11/15/16 0441 11/15/16 0821  BP:  (!) 143/70 138/72   Pulse:  71 72   Resp:  18 18   Temp:  98 F (36.7 C) 98.2 F (36.8 C)   TempSrc:  Oral Oral   SpO2: 94% 91% 92% 90%  Weight:   230 lb 6.1 oz (104.5 kg)   Height:        Intake/Output Summary (Last 24 hours) at 11/15/16 0849 Last data filed at 11/15/16 0600  Gross per 24 hour  Intake                50 ml  Output             1025 ml  Net             -975 ml   Filed Weights   11/13/16 0500 11/14/16 1700 11/15/16 0441  Weight: 234 lb 2.1 oz (106.2 kg) 231 lb 7.7 oz (105 kg) 230 lb 6.1 oz (104.5 kg)    Physical Exam    General: Well developed chronically ill appearing WM, in no acute distress. HEENT: Normocephalic, atraumatic, sclera non-icteric, no xanthomas, nares are without discharge. Neck: JVP not elevated. Lungs: Diffusely diminished BS with quiet exp wheezing. Breathing is unlabored. Cardiac: RRR S1 S2 without murmurs, rubs, or gallops.  Abdomen: Soft, non-tender, non-distended with normoactive bowel sounds. No rebound/guarding. Extremities: No clubbing or cyanosis. No edema. Distal pedal pulses are 2+ and equal bilaterally. Skin: Warm and dry, no significant rash. Neuro: Alert and oriented X 3. Sensation in tact. Follows commands. Psych:  Responds to questions appropriately with a normal affect.   Labs  CBC No results for input(s): WBC, NEUTROABS, HGB, HCT, MCV, PLT in the last 72 hours. Basic Metabolic Panel  Recent Labs  11/14/16 0529 11/15/16 0528  NA 142 141  K 4.5 4.7  CL 109 107  CO2 28 27  GLUCOSE 107* 98  BUN 23* 23*  CREATININE 1.36* 1.24  CALCIUM 10.2 10.5*  MG 1.4* 1.7    Telemetry    NSR - one pause yesterday during daytime hours 3.8sec  Radiology    Dg Chest 2 View  Result Date: 11/12/2016 CLINICAL DATA:  Shortness of breath, fever and cough for 1 week, history hypertension, former smoker, COPD, colon cancer EXAM: CHEST  2 VIEW COMPARISON:  11 20,017 FINDINGS: Upper normal heart size. Mediastinal contours and pulmonary vascularity normal. Atherosclerotic calcification aorta. Small bibasilar pleural effusions. Underlying emphysematous changes. No definite acute infiltrate or pneumothorax. Bones demineralized. IMPRESSION: COPD changes with bibasilar small pleural effusions. No definite acute infiltrate. Electronically Signed   By:  Lavonia Dana M.D.   On: 11/12/2016 09:07   Dg Chest 2 View  Result Date: 11/08/2016 CLINICAL DATA:  82 y/o M; worsening shortness of breath since this morning. Elevated troponin. EXAM: CHEST  2 VIEW COMPARISON:  11/08/2016 chest radiograph. FINDINGS: Increasing distortion markings and linear performed lines compatible interstitial pulmonary edema. Small bilateral pleural effusions. Stable cardiac silhouette given projection and technique. Aortic atherosclerosis with arch calcification. Degenerative changes of the thoracic spine. IMPRESSION: Interstitial pulmonary edema and small bilateral effusions grossly unchanged from prior radiographs. Electronically Signed   By: Kristine Garbe M.D.   On: 11/08/2016 22:15   Dg Chest 2 View  Result Date: 11/08/2016 CLINICAL DATA:  Low O2 sats, shortness of breath. EXAM: CHEST  2 VIEW COMPARISON:  11/08/2016 and CT chest 11/03/2016. FINDINGS: Trachea is midline. Heart size stable. Thoracic aorta is calcified. Mid and lower lung zone interstitial prominence and peripheral septal lines. Small bilateral pleural effusions. IMPRESSION: Findings suspicious for mild edema and small bilateral pleural effusions. Electronically Signed   By: Lorin Picket M.D.   On: 11/08/2016 09:09   Dg Chest 2 View  Result Date: 11/03/2016 CLINICAL DATA:  Shortness of breath, diarrhea, fatigue. History of COPD, colon cancer. EXAM: CHEST  2 VIEW COMPARISON:  Chest radiograph July 06, 2013 FINDINGS: Cardiomediastinal silhouette is upper limits of normal in size. Mildly calcified aorta. No pleural effusion or focal consolidation. No pneumothorax. Strandy densities LEFT lung base. Small nodular density projecting RIGHT lung base. Old RIGHT anterior rib fractures. Severe RIGHT glenohumeral osteoarthrosis. Moderate degenerative change of thoracic spine. IMPRESSION: Borderline cardiomegaly.  LEFT lung base atelectasis. **An incidental finding of potential clinical significance has been  found. Small nodular density RIGHT lung base. Recommend CT chest at 6-12 months.** Electronically Signed   By: Elon Alas M.D.   On: 11/03/2016 20:16   Ct Angio Chest Pe W And/or Wo Contrast  Result Date: 11/03/2016 CLINICAL DATA:  Shortness of breath, fatigue. History of colon cancer, emphysema and COPD. EXAM: CT ANGIOGRAPHY CHEST WITH CONTRAST TECHNIQUE: Multidetector CT imaging of the chest was performed using the standard protocol during bolus administration of intravenous contrast. Multiplanar CT image reconstructions and MIPs were obtained to evaluate the vascular anatomy. CONTRAST:  80 cc Isovue 370 COMPARISON:  Chest radiograph November 03, 2016 at 1957 hours and CT chest May 16, 2013 FINDINGS: CARDIOVASCULAR: Adequate contrast opacification of the pulmonary artery's. Main pulmonary artery is not enlarged. No pulmonary arterial filling defects to the level of the subsegmental branches. Heart size is  normal, no right heart strain. Mild coronary artery calcifications. No pericardial effusions. Thoracic aorta is normal course and caliber, mild calcific atherosclerosis. MEDIASTINUM/NODES: No lymphadenopathy by CT size criteria. Small RIGHT hilar lymph nodes without lymphadenopathy by CT size criteria. LUNGS/PLEURA: Mild bronchial wall thickening. Minimal secretions in the trachea mucous plugging RIGHT lower lobe bronchi. Diffuse mild bronchial wall thickening. Hypo enhancing RIGHT lower lobe consolidation with air bronchograms. Moderate centrilobular emphysema. No pleural effusion. No pulmonary nodule or mass. LEFT lung base atelectasis. UPPER ABDOMEN: Included view of the abdomen is nonacute. Adrenal glands not included. Hepatic steatosis. MUSCULOSKELETAL: Visualized soft tissues and included osseous structures are nonacute. Severe RIGHT glenohumeral osteoarthrosis with undersurface spurring. Old RIGHT antral lateral rib fractures. Review of the MIP images confirms the above findings. IMPRESSION: No  acute pulmonary embolism. RIGHT lower lobe consolidation concerning for pneumonia, possibly secondary to aspiration. Diffuse mild bronchial wall thickening can be seen with reactive airway dose disease or bronchitis. Secretions in the trachea and bronchi consistent with aspiration. Moderate emphysema. No pulmonary nodule, radiographic findings could be artifact or, secondary to RIGHT lung base consolidation. Electronically Signed   By: Elon Alas M.D.   On: 11/03/2016 23:15   Dg Esophagus  Result Date: 11/05/2016 CLINICAL DATA:  Dysphagia. EXAM: ESOPHOGRAM/BARIUM SWALLOW TECHNIQUE: Single contrast examination was performed using  thin barium. FLUOROSCOPY TIME:  Fluoroscopy Time:  1 minutes 30 seconds Radiation Exposure Index (if provided by the fluoroscopic device): Number of Acquired Spot Images: 0 COMPARISON:  None. FINDINGS: Moderate esophageal dysmotility. No stricture or mass. Barium passes readily into the stomach. No hiatal hernia. Stomach grossly normal. The patient swallowed barium while supine for the study. IMPRESSION: Esophageal dysmotility.  Negative for stricture or mass.  No hernia. Electronically Signed   By: Franchot Gallo M.D.   On: 11/05/2016 09:30   Dg Chest Port 1 View  Result Date: 11/08/2016 CLINICAL DATA:  Acute onset of shortness of breath. Initial encounter. EXAM: PORTABLE CHEST 1 VIEW COMPARISON:  Chest radiograph and CTA of the chest performed 11/03/2016 FINDINGS: The lungs are well-aerated. Vascular congestion is noted. Bilateral central and bibasilar airspace opacities raise concern for pulmonary edema, though pneumonia could have a similar appearance. Small bilateral pleural effusions are suspected. No pneumothorax is seen. The cardiomediastinal silhouette is within normal limits. No acute osseous abnormalities are seen. IMPRESSION: Vascular congestion noted. Bilateral central and bibasilar airspace opacities raise concern for pulmonary edema, though pneumonia could  have a similar appearance. Small bilateral pleural effusions. Electronically Signed   By: Garald Balding M.D.   On: 11/08/2016 01:10     Patient Profile     82 y.o. malewith history of COPD, colon CA, alcohol abuse, former tobacco abuse, HTN, obesity, DNR presented to [facility] with generalized weakness, unable to ambulate, cough, and mild diarrhea - found to acute hpoxic respiratory failure, sepsis due to aspiration PNA, AKI with Cr 1.73, hyperkalemia 5.4, leukocytosis 14.8, mild macroytic anemia, and C diff colitis. Cardiology following for SVT, also found to have elevated troponin (0.18) and new LV dysfunction (EF 30-35% by echo 11/09/16).   Assessment & Plan    1. Multiple medical issues including sepsis, C diff colitis, aspiration PNA, acute hypoxic respiratory failure - per IM.  2. Elevated troponin - suspect demand process in the setting of above. Has never been evaluated from ischemic standpoint. Denies any recent angina. Interestingly does have deep ear creases which have been a/w CAD. Would allow him to improve from COPD standpoint and re-evaluate in the  office to arrange Lexiscan nuc if breathing status stable.  3. SVT - improved with defervescence of acute illness. He did have a 3.8 sec pause yesterday afternoon but had gotten PO dilt and new bisoprolol. Diltiazem has been d/c'd due to LV dysfunction. Follow on telemetry for now. If further pauses will need to decrease bisoprolol dose. He's not had any symptoms related to this. Will need OP f/u of abnormal ?TSH.  4. Acute systolic CHF with new recognition of LV dysfunction - etiology unclear, could be viral/septic, related to alcohol use or possibly ischemic in nature. Continue ACEI, BB, and Lasix. Will d/c potassium since K is beginning to rise.  5. Alcohol abuse - patient was somewhat guarded when discussing intake during initial consult. Advised to obstain from further use at this time.  6. Acute kidney injury - baseline unclear,  normal in 2014. Seems to have leveled off.  7. Hypomagnesemia - continue MagOx '400mg'$  BID. Do not want to increase higher than this as we don't want to potentiate more diarrhea.  Signed, Charlie Pitter, PA-C  11/15/2016, 8:49 AM   I have personally seen and examined this patient with Melina Copa, PA-C. I agree with the assessment and plan as outlined above. He is admitted with sepsis due to PNA, C diff colitis. Elevated troponin likely due to demand ischemia. Moderate LV systolic dysfunction. NO plans for invasive cardiac evaluation. Medical management of cardiomyopathy. No recurrence of SVT. Continue bisoprolol.   Lauree Chandler 11/15/2016 10:19 AM

## 2016-11-15 NOTE — Progress Notes (Signed)
Patients O2 saturation dropped into the 70s during the night while on the CPAP machine at approx 0330. RT was notified and was told to bump up O2 as high as 15L. I placed the  patient on 10L to sustain a O2 level of >90. Patient was kept on CPAP machine until aprox. 0600. I then placed patient on 6L [place] which is what he was on previously. O2 sats at that current time was 92%. Patient was asymptomatic and other VS were WNL. Will continue to monitor closely.

## 2016-11-15 NOTE — NC FL2 (Signed)
[place] LEVEL OF CARE SCREENING TOOL     IDENTIFICATION  Patient Name: David Barton Birthdate: 26-Feb-1933 Sex: male Admission Date (Current Location): 11/03/2016  [facility] and [place] Number:  Herbalist and Address:  [facility],  [place], [place]      Provider Number: [id number]  Attending Physician Name and Address:  Charlynne Cousins, MD  Relative Name and Phone Number:       Current Level of Care: Hospital Recommended Level of Care: [place] Prior Approval Number:    Date Approved/Denied:   PASRR Number: [id number]  Discharge Plan: SNF    Current Diagnoses: Patient Active Problem List   Diagnosis Date Noted  . Hypomagnesemia 11/15/2016  . Sleep apnea   . Sepsis ([place]) 11/10/2016  . Elevated troponin 11/10/2016  . Acute systolic CHF (congestive heart failure) ([place]) 11/10/2016  . Cardiomyopathy ([place]) 11/10/2016  . PSVT (paroxysmal supraventricular tachycardia) ([place]) 11/10/2016  . Dysphagia   . Renal insufficiency   . Enteritis due to Clostridium difficile   . Aspiration pneumonia ([place]) 11/04/2016  . Diarrhea 11/04/2016  . AKI (acute kidney injury) ([place]) 11/04/2016  . Hyperkalemia 11/04/2016  . Community acquired pneumonia of right lung 11/03/2016  . Essential hypertension 05/31/2013  . COPD  GOLD II with reversibility 05/29/2013  . Hypokalemia 05/08/2013  . Hypotension 05/08/2013  . Dizziness 05/08/2013  . Hypoxia 05/08/2013  . Acute respiratory failure ([place]) 05/08/2013    Orientation RESPIRATION BLADDER Height & Weight     Self, Time, Situation, Place  O2 (6L) External catheter, Continent Weight: 230 lb 6.1 oz (104.5 kg) Height:  '5\' 10"'$  (177.8 cm)  BEHAVIORAL SYMPTOMS/MOOD NEUROLOGICAL BOWEL NUTRITION STATUS      Continent Diet (Heart)  AMBULATORY STATUS COMMUNICATION OF NEEDS Skin   Extensive Assist Verbally Normal                       Personal Care Assistance  Level of Assistance  Bathing, Dressing Bathing Assistance: Limited assistance   Dressing Assistance: Limited assistance     Functional Limitations Info             SPECIAL CARE FACTORS FREQUENCY  PT (By licensed PT), OT (By licensed OT)     PT Frequency: 5 OT Frequency: 5            Contractures      Additional Factors Info  Code Status, Allergies Code Status Info: DNR Allergies Info: Flexeril Cyclobenzaprine           Current Medications (11/15/2016):  This is the current hospital active medication list Current Facility-Administered Medications  Medication Dose Route Frequency Provider Last Rate Last Dose  . acetaminophen (TYLENOL) tablet 650 mg  650 mg Oral Q6H PRN Belkys A Regalado, MD   650 mg at 11/04/16 0835   Or  . acetaminophen (TYLENOL) suppository 650 mg  650 mg Rectal Q6H PRN Belkys A Regalado, MD      . aspirin EC tablet 81 mg  81 mg Oral q morning - 10a Belkys A Regalado, MD   81 mg at 11/15/16 1042  . bisoprolol (ZEBETA) tablet 10 mg  10 mg Oral Daily Mihai Croitoru, MD   10 mg at 11/15/16 1040  . budesonide (PULMICORT) nebulizer solution 0.25 mg  0.25 mg Nebulization BID Barton Dubois, MD   0.25 mg at 11/15/16 0820  . enoxaparin (LOVENOX) injection 40 mg  40 mg Subcutaneous Daily Belkys  A Regalado, MD   40 mg at 11/15/16 1042  . ferrous sulfate tablet 325 mg  325 mg Oral Q breakfast Belkys A Regalado, MD   325 mg at 11/15/16 0856  . furosemide (LASIX) tablet 20 mg  20 mg Oral BID Barton Dubois, MD   20 mg at 11/15/16 0856  . guaiFENesin (MUCINEX) 12 hr tablet 600 mg  600 mg Oral BID Florencia Reasons, MD   600 mg at 11/15/16 1040  . hydrALAZINE (APRESOLINE) injection 5 mg  5 mg Intravenous Q6H PRN Hosie Poisson, MD      . lisinopril (PRINIVIL,ZESTRIL) tablet 40 mg  40 mg Oral Daily Mihai Croitoru, MD   40 mg at 11/15/16 1041  . magnesium oxide (MAG-OX) tablet 400 mg  400 mg Oral BID Barton Dubois, MD   400 mg at 11/15/16 1041  . magnesium sulfate IVPB 2 g 50 mL   2 g Intravenous Once Charlynne Cousins, MD   2 g at 11/15/16 1042  . MEDLINE mouth rinse  15 mL Mouth Rinse BID Belkys A Regalado, MD   15 mL at 11/15/16 1042  . multivitamin with minerals tablet 1 tablet  1 tablet Oral Daily Belkys A Regalado, MD   1 tablet at 11/15/16 1041  . ondansetron (ZOFRAN) tablet 4 mg  4 mg Oral Q6H PRN Belkys A Regalado, MD       Or  . ondansetron (ZOFRAN) injection 4 mg  4 mg Intravenous Q6H PRN Belkys A Regalado, MD      . saccharomyces boulardii (FLORASTOR) capsule 250 mg  250 mg Oral BID Barton Dubois, MD   250 mg at 11/15/16 1041  . sertraline (ZOLOFT) tablet 50 mg  50 mg Oral Daily Belkys A Regalado, MD   50 mg at 11/15/16 1041  . thiamine (VITAMIN B-1) tablet 100 mg  100 mg Oral Daily Dayna N Dunn, PA-C   100 mg at 11/15/16 1040  . tiotropium (SPIRIVA) inhalation capsule 18 mcg  18 mcg Inhalation Daily Belkys A Regalado, MD   18 mcg at 11/15/16 0820  . traMADol (ULTRAM) tablet 50 mg  50 mg Oral Q6H PRN Belkys A Regalado, MD      . vancomycin (VANCOCIN) 50 mg/mL oral solution 500 mg  500 mg Oral Q6H Florencia Reasons, MD   500 mg at 11/15/16 0610  . vitamin B-12 (CYANOCOBALAMIN) tablet 1,000 mcg  1,000 mcg Oral Daily Barton Dubois, MD   1,000 mcg at 11/15/16 1040     Discharge Medications: Please see discharge summary for a list of discharge medications.  Relevant Imaging Results:  Relevant Lab Results:   Additional Information SSN: [id number]  Standley Brooking, LCSW

## 2016-11-15 NOTE — Care Management Note (Signed)
Case Management Note  Patient Details  Name: David Barton MRN: [id number] Date of Birth: [birth year]  Subjective/Objective:  Noted home 02 ordered-[facility] dme rep aware of order-awaiting 02 sats to be documented-Nsg aware.                  Action/Plan:d/c home.   Expected Discharge Date:   (unknown)               Expected Discharge Plan:  [place]  In-House Referral:  Clinical Social Work  Discharge planning Services  CM Consult  Post Acute Care Choice:    Choice offered to:  Patient  DME Arranged:    DME Agency:     HH Arranged:    [place] Agency:  [place]  Status of Service:  In process, will continue to follow  If discussed at Long Length of Stay Meetings, dates discussed:    Additional Comments:  Dessa Phi, RN 11/15/2016, 11:39 AM

## 2016-11-15 NOTE — Progress Notes (Signed)
SATURATION QUALIFICATIONS: (This note is used to comply with regulatory documentation for home oxygen)  Patient Saturations on 6L at Rest = 93%  Patient Saturations on Room Air while Ambulating = 79%  Patient Saturations on 6 Liters of oxygen while Ambulating = 90%  Anea Fodera, Wayne Both

## 2016-11-15 NOTE — Progress Notes (Signed)
[facility] PROGRESS NOTE    Progress Note  David Barton  QPY:[id number] DOB: 1933/03/28 DOA: 11/03/2016 PCP: Vena Austria, MD     Brief Narrative:   David Barton is an 82 y.o. male past medical history significant for colon cancer , COPD not on oxygen, chronic systolic heart failure with an EF of 35%. Essential hypertension who presents with complaint of generalized weakness unable to ambulate., With 4 days of multiple watery bowel movement prior to admission, he was recently treated by PCP for UTI. Treated for C. difficile colitis and community-acquired pneumonia now on  SVT with acut systolic heart failure.  Assessment/Plan:   Severe  Sepsis ([place]): Likely due to aspiration pneumonia and C. difficile colitis. Continue nasal cannula started on empiric antibiotics saturations have improved to greater than 90% on 6 L. Blood cultures have been negative till date. He has defervesced and leukocytosis improved. We'll continue to wean him off oxygen.  Acute respiratory failure with hypoxia multifactorial due to newly diagnosed acute systolic heart failure and questionable aspiration pneumonia: Currently on 6 L of oxygen. Cardiology has been consulted, continue lisinopril, bisoprolol and oral Lasix twice a day. CT scan showed right lobe consolidation concerning for aspiration pneumonia, he completed his antibiotic therapy in house. Blood cultures were negative urine Legionella and strep pneumo were negative. Chest x-ray showed improvement radiation on 11/14/2016.   Presumed sleep apnea: Currently on BiPAP continue, will need sleep study as an outpatient.  C. difficile colitis: Recently treated for antibiotic for UTI by his PCP. He was started on oral vancomycin and Flagyl. Now improved Flagyl, rectal tube was DC'd. Continue Florastorand vancomycin, will continue treatment after aspiration pneumonia treatment has finished for 1 week.  Dysphagia: Resulting in  aspiration pneumonia has completed antibiotic regimen in house., Likely further evaluation as an outpatient by GI.  Acute kidney injury: In the setting of prerenal azotemia in the setting of sepsis. Baseline creatinine less than 1, slowly trending down.  COPD: Not oxygen dependent at home, he is a prior smoker. He has been V setting with ambulation will probably need oxygen as an outpatient he's getting oxygen.  Essential hypertension: Currently stable at goal continue antihypertensive regimen for now.  Paroxysmal SVT: Currently asymptomatic controlled on bisoprolol. Rate controlled no evidence on telemetry. Check potassium greater than 4 magnesium greater than 2. Active Problems:  Hypomagnesemia: Replete potassium IV continue orally recheck in the morning.   DVT prophylaxis: lovenox Family Communication:Wife Disposition Plan/Barrier to D/C: home in 1-2 days Code Status:     Code Status Orders        Start     Ordered   11/04/16 0127  Do not attempt resuscitation (DNR)  Continuous    Question Answer Comment  In the event of cardiac or respiratory ARREST Do not call a "code blue"   In the event of cardiac or respiratory ARREST Do not perform Intubation, CPR, defibrillation or ACLS   In the event of cardiac or respiratory ARREST Use medication by any route, position, wound care, and other measures to relive pain and suffering. May use oxygen, suction and manual treatment of airway obstruction as needed for comfort.      11/04/16 0126    Code Status History    Date Active Date Inactive Code Status Order ID Comments User Context   05/09/2013 12:38 AM 05/10/2013  6:48 PM Full Code [id number]  Geradine Girt, DO Inpatient        IV Access:  Peripheral IV   Procedures and diagnostic studies:   No results found.   Medical Consultants:    None.  Anti-Infectives:   Oral vancomycin  Subjective:    [name] he relates his breathing is better. Has no  new complaints.  Objective:    Vitals:   11/14/16 2050 11/14/16 2104 11/15/16 0441 11/15/16 0821  BP:  (!) 143/70 138/72   Pulse:  71 72   Resp:  18 18   Temp:  98 F (36.7 C) 98.2 F (36.8 C)   TempSrc:  Oral Oral   SpO2: 94% 91% 92% 90%  Weight:   104.5 kg (230 lb 6.1 oz)   Height:        Intake/Output Summary (Last 24 hours) at 11/15/16 0955 Last data filed at 11/15/16 0900  Gross per 24 hour  Intake              170 ml  Output             1026 ml  Net             -856 ml   Filed Weights   11/13/16 0500 11/14/16 1700 11/15/16 0441  Weight: 106.2 kg (234 lb 2.1 oz) 105 kg (231 lb 7.7 oz) 104.5 kg (230 lb 6.1 oz)    Exam: General exam: In no acute distress. Respiratory system: Good air movement and Crackles on the right. Cardiovascular system: S1 & S2 heard, RRR. No JVD. Gastrointestinal system: Abdomen is nondistended, soft and nontender.  Central nervous system: Alert and oriented. No focal neurological deficits. Extremities: No pedal edema. Skin: No rashes, lesions or ulcers Psychiatry: Judgement and insight appear normal. Mood & affect appropriate.    Data Reviewed:    Labs: Basic Metabolic Panel:  Recent Labs Lab 11/10/16 0331 11/11/16 0439 11/12/16 0358 11/13/16 0526 11/14/16 0529 11/15/16 0528  NA 142 142  --  143 142 141  K 3.7 3.6  --  4.1 4.5 4.7  CL 109 108  --  110 109 107  CO2 26 25  --  '28 28 27  '$ GLUCOSE 105* 95  --  100* 107* 98  BUN 22* 22*  --  22* 23* 23*  CREATININE 1.36* 1.23  --  1.45* 1.36* 1.24  CALCIUM 9.8 10.0  --  9.9 10.2 10.5*  MG 1.7 1.5* 1.4* 1.4* 1.4* 1.7   GFR Estimated Creatinine Clearance: 57.5 mL/min (by C-G formula based on SCr of 1.24 mg/dL). Liver Function Tests: No results for input(s): AST, ALT, ALKPHOS, BILITOT, PROT, ALBUMIN in the last 168 hours. No results for input(s): LIPASE, AMYLASE in the last 168 hours. No results for input(s): AMMONIA in the last 168 hours. Coagulation profile No results for  input(s): INR, PROTIME in the last 168 hours.  CBC:  Recent Labs Lab 11/09/16 0620 11/10/16 0331 11/11/16 0439 11/12/16 0358  WBC 6.8 5.5 5.5 5.5  NEUTROABS 4.6 3.3 3.3 3.5  HGB 11.4* 11.2* 11.0* 10.6*  HCT 35.2* 34.6* 34.8* 33.4*  MCV 109.0* 109.1* 107.4* 109.2*  PLT 198 204 226 223   Cardiac Enzymes:  Recent Labs Lab 11/08/16 1821 11/09/16 0008 11/09/16 0620  TROPONINI 0.18* 0.14* 0.11*   BNP (last 3 results) No results for input(s): PROBNP in the last 8760 hours. CBG: No results for input(s): GLUCAP in the last 168 hours. D-Dimer: No results for input(s): DDIMER in the last 72 hours. Hgb A1c: No results for input(s): HGBA1C in the last 72 hours. Lipid Profile:  No results for input(s): CHOL, HDL, LDLCALC, TRIG, CHOLHDL, LDLDIRECT in the last 72 hours. Thyroid function studies: No results for input(s): TSH, T4TOTAL, T3FREE, THYROIDAB in the last 72 hours.  Invalid input(s): FREET3 Anemia work up: No results for input(s): VITAMINB12, FOLATE, FERRITIN, TIBC, IRON, RETICCTPCT in the last 72 hours. Sepsis Labs:  Recent Labs Lab 11/09/16 0620 11/10/16 0331 11/11/16 0439 11/12/16 0358  WBC 6.8 5.5 5.5 5.5   Microbiology No results found for this or any previous visit (from the past 240 hour(s)).   Medications:   . aspirin EC  81 mg Oral q morning - 10a  . bisoprolol  10 mg Oral Daily  . budesonide (PULMICORT) nebulizer solution  0.25 mg Nebulization BID  . enoxaparin (LOVENOX) injection  40 mg Subcutaneous Daily  . ferrous sulfate  325 mg Oral Q breakfast  . furosemide  20 mg Oral BID  . guaiFENesin  600 mg Oral BID  . lisinopril  40 mg Oral Daily  . magnesium oxide  400 mg Oral BID  . mouth rinse  15 mL Mouth Rinse BID  . multivitamin with minerals  1 tablet Oral Daily  . saccharomyces boulardii  250 mg Oral BID  . sertraline  50 mg Oral Daily  . thiamine  100 mg Oral Daily  . tiotropium  18 mcg Inhalation Daily  . vancomycin  500 mg Oral Q6H  .  vitamin B-12  1,000 mcg Oral Daily   Continuous Infusions:  Time spent: 25 min   LOS: 12 days   Charlynne Cousins  [facility] Pager [phone]  *Please refer to [email or web address], password [email or web address] to get updated schedule on who will round on this patient, as hospitalists switch teams weekly. If 7PM-7AM, please contact night-coverage at [email or web address], password [email or web address] for any overnight needs.  11/15/2016, 9:55 AM

## 2016-11-15 NOTE — Progress Notes (Signed)
Physical Therapy Treatment Patient Details Name: David Barton MRN: [id number] DOB: 1933/03/26 Today's Date: 11/15/2016    History of Present Illness  David Barton is a 82 y.o. male with medical history significant of Colon cancer,COPD, HTN who presents complaining of generalized weakness, unable to ambulate, diarrhea.     CT angio: RIGHT lower lobe consolidation concerning for pneumonia, possibly secondary to aspiration. Diffuse mild bronchial wall thickening can be seen with reactive airway dose disease or bronchitis. Secretions in the trachea and bronchi consistent with aspiration     PT Comments    Pt progressing; pt would still benefit from SNF rehab at this point; he is still requiring assist to amb, assist with ADLS and continuous O2; if pt has extended stay may be able to D/C home with HHPT and 24  Hr assist  Follow Up Recommendations  SNF     Equipment Recommendations  Rolling walker with 5" wheels    Recommendations for Other Services       Precautions / Restrictions Precautions Precautions: Fall Precaution Comments: monitor sats; resting sats on 6L 94%;, 79 % on RA amb, returned to 93% at rest on 6L    Mobility  Bed Mobility Overal bed mobility: Needs Assistance Bed Mobility: Supine to Sit     Supine to sit: Min assist Sit to supine: Min assist      Transfers Overall transfer level: Needs assistance Equipment used: Rolling walker (2 wheeled) Transfers: Sit to/from Stand Sit to Stand: Min guard Stand pivot transfers: Min assist       General transfer comment: VC's for UE placement.   Ambulation/Gait Ambulation/Gait assistance: Min guard Ambulation Distance (Feet): 80 Feet Assistive device: Rolling walker (2 wheeled) Gait Pattern/deviations: Step-through pattern;Trunk flexed     General Gait Details: cues for position from RW, breathing   Stairs            Wheelchair Mobility    Modified Rankin (Stroke Patients Only)        Balance             Standing balance-Leahy Scale: Poor                      Cognition Arousal/Alertness: Awake/alert Behavior During Therapy: WFL for tasks assessed/performed Overall Cognitive Status: Within Functional Limits for tasks assessed                      Exercises      General Comments        Pertinent Vitals/Pain Pain Assessment: No/denies pain    Home Living Family/patient expects to be discharged to:: Private residence Living Arrangements: Spouse/significant other Available Help at Discharge: Family Type of Home: House Home Access: Stairs to enter   Home Layout: One level Home Equipment: None      Prior Function Level of Independence: Independent          PT Goals (current goals can now be found in the care plan section) Acute Rehab PT Goals Patient Stated Goal: to get stronger PT Goal Formulation: With patient Time For Goal Achievement: 11/19/16 Potential to Achieve Goals: Good Progress towards PT goals: Progressing toward goals    Frequency    Min 3X/week      PT Plan Current plan remains appropriate    Co-evaluation             End of Session Equipment Utilized During Treatment: Oxygen;Gait belt Activity Tolerance: Patient tolerated treatment well Patient left:  in chair;with call bell/phone within reach;with chair alarm set     Time: [phone] PT Time Calculation (min) (ACUTE ONLY): 16 min  Charges:  $Gait Training: 8-22 mins                    G Codes:      David Barton 12-13-2016, 5:29 PM

## 2016-11-15 NOTE — Care Management Important Message (Signed)
Important Message  Patient Details IM Letter given to Cookie/Case Manager to present to Patient Name: OKLEY MAGNUSSEN MRN: [id number] Date of Birth: May 01, 1933   Medicare Important Message Given:  Yes    Camillo Flaming 11/15/2016, 9:35 AMImportant Message  Patient Details  Name: MICAHEL OMLOR MRN: [id number] Date of Birth: 1933/03/26   Medicare Important Message Given:  Yes    Camillo Flaming 11/15/2016, 9:35 AM

## 2016-11-15 NOTE — Progress Notes (Signed)
Spoke with pt regarding nocturnal cpap.  Pt stated it was too uncomfortable last night and does not want to wear it tonight.  Pt was advised that RT is available all night should he change his mind.  Pt remains on 6lnc.  RN aware.

## 2016-11-16 DIAGNOSIS — J810 Acute pulmonary edema: Secondary | ICD-10-CM

## 2016-11-16 DIAGNOSIS — J811 Chronic pulmonary edema: Secondary | ICD-10-CM

## 2016-11-16 LAB — BASIC METABOLIC PANEL
Anion gap: 6 (ref 5–15)
BUN: 26 mg/dL — ABNORMAL HIGH (ref 6–20)
CALCIUM: 10.5 mg/dL — AB (ref 8.9–10.3)
CO2: 27 mmol/L (ref 22–32)
CREATININE: 1.19 mg/dL (ref 0.61–1.24)
Chloride: 108 mmol/L (ref 101–111)
GFR calc non Af Amer: 56 mL/min — ABNORMAL LOW (ref >60)
Glucose, Bld: 99 mg/dL (ref 65–99)
Potassium: 4.8 mmol/L (ref 3.5–5.1)
SODIUM: 141 mmol/L (ref 135–145)

## 2016-11-16 LAB — MAGNESIUM: MAGNESIUM: 1.8 mg/dL (ref 1.7–2.4)

## 2016-11-16 MED ORDER — FUROSEMIDE 10 MG/ML IJ SOLN
40.0000 mg | Freq: Once | INTRAMUSCULAR | Status: AC
  Administered 2016-11-16: 40 mg via INTRAVENOUS
  Filled 2016-11-16: qty 4

## 2016-11-16 NOTE — Care Management Note (Signed)
Case Management Note  Patient Details  Name: [name] MRN: [id number] Date of Birth: [birth year]  Subjective/Objective: 02 weaning, home 02 order placed-[facility] dme rep [place] following-awaiting 02 sats, no d/c today. [facility] rep for [facility] aware of recc-HHRN/PT/OT/aide/SW-awaiting HHC order, provided spouse w/private sitter listing. Spouse currently declines SNF-she prefers home w/HHC, but will have sw from [facility] to asst w/transitioning to SNF if needed once @ home-spouse will talk to [facility] directly about her concerns.                   Action/Plan:d/c plan home w/HHC/likely home 02   Expected Discharge Date:   (unknown)               Expected Discharge Plan:  [place]  In-House Referral:  Clinical Social Work  Discharge planning Services  CM Consult  Post Acute Care Choice:    Choice offered to:  Patient  DME Arranged:    DME Agency:     HH Arranged:    [place] Agency:  [place]  Status of Service:  In process, will continue to follow  If discussed at Long Length of Stay Meetings, dates discussed:    Additional Comments:  Dessa Phi, RN 11/16/2016, 1:02 PM

## 2016-11-16 NOTE — Progress Notes (Signed)
Patient Name: David Barton Date of Encounter: 11/16/2016  Primary Cardiologist: Dr. Serena Croissant Problem List     Principal Problem:   Sepsis [facility]) Active Problems:   Acute respiratory failure ([place])   COPD  GOLD II with reversibility   Essential hypertension   Community acquired pneumonia of right lung   Aspiration pneumonia ([name])   Diarrhea   AKI (acute kidney injury) ([place])   Hyperkalemia   Elevated troponin   Acute systolic CHF (congestive heart failure) ([name])   Cardiomyopathy ([name])   PSVT (paroxysmal supraventricular tachycardia) ([name])   Dysphagia   Renal insufficiency   Enteritis due to Clostridium difficile   Sleep apnea   Hypomagnesemia    Subjective   Breathing improved. Had desaturation to 79% on RA with ambulation. Denies any chest discomfort or palpitations. Patient's wife concerned he is not being active enough and should be up ambulating more frequently.   Inpatient Medications    Scheduled Meds: . aspirin EC  81 mg Oral q morning - 10a  . bisoprolol  10 mg Oral Daily  . budesonide (PULMICORT) nebulizer solution  0.25 mg Nebulization BID  . enoxaparin (LOVENOX) injection  40 mg Subcutaneous Daily  . ferrous sulfate  325 mg Oral Q breakfast  . furosemide  20 mg Oral BID  . guaiFENesin  600 mg Oral BID  . lisinopril  40 mg Oral Daily  . magnesium oxide  400 mg Oral BID  . mouth rinse  15 mL Mouth Rinse BID  . multivitamin with minerals  1 tablet Oral Daily  . saccharomyces boulardii  250 mg Oral BID  . sertraline  50 mg Oral Daily  . thiamine  100 mg Oral Daily  . tiotropium  18 mcg Inhalation Daily  . vancomycin  500 mg Oral Q6H  . vitamin B-12  1,000 mcg Oral Daily   Continuous Infusions:  PRN Meds: acetaminophen **OR** acetaminophen, hydrALAZINE, ondansetron **OR** ondansetron (ZOFRAN) IV, traMADol   Vital Signs    Vitals:   11/15/16 1731 11/15/16 2023 11/15/16 2230 11/16/16 0857  BP:   123/65   Pulse:   86   Resp:   18   Temp:    98.7 F (37.1 C)   TempSrc:   Oral   SpO2: 90% 92% 98% 94%  Weight:      Height:        Intake/Output Summary (Last 24 hours) at 11/16/16 0941 Last data filed at 11/15/16 1746  Gross per 24 hour  Intake              290 ml  Output              300 ml  Net              -10 ml   Filed Weights   11/13/16 0500 11/14/16 1700 11/15/16 0441  Weight: 234 lb 2.1 oz (106.2 kg) 231 lb 7.7 oz (105 kg) 230 lb 6.1 oz (104.5 kg)    Physical Exam    GEN: Overweight Caucasian male currently in no acute distress.  HEENT: Grossly normal.  Neck: Supple, no JVD, carotid bruits, or masses. Cardiac: RRR, no murmurs, rubs, or gallops. No clubbing, cyanosis, edema.  Radials/DP/PT 2+ and equal bilaterally.  Respiratory:  Respirations regular and unlabored, mild expiratory wheezing in upper lung fields.  GI: Soft, nontender, nondistended, BS + x 4. MS: no deformity or atrophy. Skin: warm and dry, no rash. Neuro:  Strength and sensation are  intact. Psych: AAOx3.  Normal affect.  Labs    CBC No results for input(s): WBC, NEUTROABS, HGB, HCT, MCV, PLT in the last 72 hours. Basic Metabolic Panel  Recent Labs  11/15/16 0528 11/16/16 0520  NA 141 141  K 4.7 4.8  CL 107 108  CO2 27 27  GLUCOSE 98 99  BUN 23* 26*  CREATININE 1.24 1.19  CALCIUM 10.5* 10.5*  MG 1.7 1.8    Telemetry    NSR, HR in 60's to 70's. No significant pauses or ectopic events noted.  - Personally Reviewed  ECG    No new tracings.   Radiology    No results found.  Cardiac Studies   Echocardiogram: 11/09/2016 Study Conclusions  - Left ventricle: The cavity size was normal. Systolic function was   moderately to severely reduced. The estimated ejection fraction   was in the range of 30% to 35%. Regional wall motion   abnormalities cannot be excluded. The study is not technically   sufficient to allow evaluation of LV diastolic function. - Mitral valve: Calcified annulus.  Impressions:  - Technically  difficult; definity used; EF difficult to quantitate   but likely moderate to severely reduced; suggest MUGA or cardiac   MRI to further assess.  Patient Profile     82 y.o. Male w/ PMH of COPD, colon CA, alcohol abuse, former tobacco abuse, HTN, obesity, DNR presented to [facility] with generalized weakness, unable to ambulate, cough, and mild diarrhea - found to acute hpoxic respiratory failure, sepsis due to aspiration PNA, AKI with Cr 1.73, hyperkalemia 5.4, leukocytosis 14.8, mild macroytic anemia, and C diff colitis. Cardiology following for SVT, also found to have elevated troponin (0.18) and new LV dysfunction (EF 30-35% by echo 11/09/16).   Assessment & Plan    1. Multiple medical issues including sepsis, C diff colitis, aspiration PNA, acute hypoxic respiratory failure  - per admitting team.  2. Elevated troponin  - values have been flat at 0.18 and 0.14. Suspect demand process in the setting of above. Has never been evaluated from an ischemic standpoint.  - can consider Lexiscan Myoview as an outpatient but would allow him to improve from COPD standpoint. Denies any recent chest discomfort.   3. SVT  - improved with defervescence of acute illness. He did have a 3.8 sec pause on 11/14/2016 but had received PO dilt and new bisoprolol. Diltiazem has been discontinued due to LV dysfunction.  - no recurrent pauses noted. Denies any symptoms related to this. TSH elevated to 5.734, T4 normal.   4. Acute systolic CHF  - with new recognition of LV dysfunction (EF 30-35%) - etiology unclear, could be viral/septic, related to alcohol use or possibly ischemic in nature. Noninvasive ischemic eval as above in the outpatient setting.  - weight down form 240 lbs on admission to 230 lbs on 11/15/2016. - Continue ACE-I, BB, and PO Lasix '20mg'$  BID.   5. Alcohol abuse  - patient was somewhat guarded when discussing intake during initial consult. Advised to obstain from further use at this time.  6.  Acute kidney injury  - baseline unclear, normal in 2014. Creatinine improved to 1.19 this AM.   7. Hypomagnesemia  - Mg at 1.8 today. Continue MagOx '400mg'$  BID. Do not want to increase higher than this as we don't want to potentiate more diarrhea.   Signed, Erma Heritage, PA  11/16/2016, 9:41 AM   I have personally seen and examined this patient with Bernerd Pho, PA-C. I  agree with the assessment and plan as outlined above. He is admitted with sepsis due to PNA, C diff colitis. Elevated troponin likely due to demand ischemia. Moderate LV systolic dysfunction. NO plans for invasive cardiac evaluation. Medical management of cardiomyopathy. No recurrence of SVT. Continue bisoprolol.   Lauree Chandler 11/16/2016 11:03 AM

## 2016-11-16 NOTE — Progress Notes (Signed)
Progress Note    David Barton  XBJ:[id number] DOB: 1933/10/24  DOA: 11/03/2016 PCP: Vena Austria, MD    Brief Narrative:   Chief complaint: Follow-up sepsis  David Barton is an 82 y.o. male with a PMH significant for colon cancer, COPD not on oxygen, chronic systolic heart failure with an EF of 35%, and essential hypertension who was admitted 11/03/16 with a chief complaint of generalized weakness, 4 days of multiple watery bowel movements prior to admission. He was recently treated by PCP for UTI. Treated for C. difficile colitis and community-acquired pneumonia,  now with SVT and acute systolic heart failure.  Assessment/Plan:   Principal Problem:   Sepsis ([name])/Acute respiratory failure Secondary to aspiration pneumonia and C. difficile colitis. Blood cultures negative to date. Hemodynamically stable.   Active Problems:   COPD  GOLD II with reversibility/acute respiratory failure Continue Pulmicort, Spiriva and Mucinex. No history of oxygen dependency, but continues to have a high oxygen requirement here. Wean as tolerated.    Essential hypertension Continue bisoprolol and Lasix.    Community acquired pneumonia of right lung/aspiration pneumonia/dysphasia Status post treatment with Rocephin/azithromycin. Blood cultures negative. Strep pneumonia and urinary Legionella antigens were negative. Chest x-ray showed improvement in aeration on 11/14/16. I have personally reviewed these images, and it appears that he still has pulmonary edema. We'll give an additional dose of IV Lasix today and recheck chest x-ray in the morning.    AKI (acute kidney injury) ([name])/renal insufficiency Creatinine back to baseline values.    Hyperkalemia Resolved.      Acute systolic CHF (congestive heart failure) ([name])/cardiomyopathy/elevated troponin New recognition of LV dysfunction (EF 30-35%) - etiology unclear, could be viral/septic, related to alcohol use or possibly ischemic  in nature. Noninvasive ischemic evaluation in the outpatient setting recommended by cardiology. Weight down from 240 pounds on admission to 230 pounds now. Continue strict I/O and daily weights. Monitor creatinine with further diuresis. Continue ACE-I, BB, and PO Lasix '20mg'$  BID.  Given an additional 40 mg dose of IV Lasix today. Recheck chest x-ray in the morning.     PSVT (paroxysmal supraventricular tachycardia) ([name])/elevated troponin/demand ischemia Seen by cardiologist on 11/09/16. Continue aspirin. No evidence of hyperthyroidism. Per cardiology recommendations: Consider Lexiscan Myoview as an outpatient but would allow him to improve from COPD standpoint. Denies any recent chest discomfort.     Enteritis due to Clostridium difficile/diarrhea Continue oral vancomycin and Florastor.    Sleep apnea Unable to tolerate CPAP. Will need outpatient sleep study.    Hypomagnesemia Repleting. Magnesium 1.8 today.   Family Communication/Anticipated D/C date and plan/Code Status   DVT prophylaxis: Lovenox ordered. Code Status: Full Code.  Family Communication: Wife at bedside. Disposition Plan: Home in the next 24-48 hours, if he can be weaned to no more than 2 L of nasal cannula oxygen. Declines nursing home placement for rehabilitation.   Medical Consultants:    Cardiology   Procedures:    2-D echo 11/09/16  Study Conclusions  - Left ventricle: The cavity size was normal. Systolic function was   moderately to severely reduced. The estimated ejection fraction   was in the range of 30% to 35%. Regional wall motion   abnormalities cannot be excluded. The study is not technically   sufficient to allow evaluation of LV diastolic function. - Mitral valve: Calcified annulus.  Anti-Infectives:   Azithromycin 11/03/16---> 11/09/16 Flagyl 11/04/16---> 11/11/16 Oral vancomycin 11/04/16---> Rocephin 11/04/16---> 11/10/16   Subjective:   The patient reports  that He feels better  today than he has had in a while. Review of symptoms is positive for ongoing dyspnea, especially with exertion, occasional cough and negative for chest pain, nausea, vomiting.  Objective:    Vitals:   11/15/16 1730 11/15/16 1731 11/15/16 2023 11/15/16 2230  BP:    123/65  Pulse:    86  Resp:    18  Temp:    98.7 F (37.1 C)  TempSrc:    Oral  SpO2: (!) 79% 90% 92% 98%  Weight:      Height:        Intake/Output Summary (Last 24 hours) at 11/16/16 0814 Last data filed at 11/15/16 1746  Gross per 24 hour  Intake              410 ml  Output              301 ml  Net              109 ml   Filed Weights   11/13/16 0500 11/14/16 1700 11/15/16 0441  Weight: 106.2 kg (234 lb 2.1 oz) 105 kg (231 lb 7.7 oz) 104.5 kg (230 lb 6.1 oz)    Exam: General exam: Appears calm and comfortable. Mildly dyspneic at rest, sitting up in chair. Obese. Respiratory system: Mild expiratory wheezing. Respiratory effort mildly increased. Cardiovascular system: S1 & S2 heard, RRR. No JVD,  rubs, gallops or clicks. No murmurs. Gastrointestinal system: Abdomen is nondistended, soft and nontender. No organomegaly or masses felt. Normal bowel sounds heard. Central nervous system: Alert and oriented. No focal neurological deficits. Extremities: No clubbing,  or cyanosis. No edema. Skin: No rashes, lesions or ulcers. Psychiatry: Judgement and insight appear normal. Mood & affect appropriate.   Data Reviewed:   I have personally reviewed following labs and imaging studies:  Labs: Basic Metabolic Panel:  Recent Labs Lab 11/11/16 0439 11/12/16 0358 11/13/16 0526 11/14/16 0529 11/15/16 0528 11/16/16 0520  NA 142  --  143 142 141 141  K 3.6  --  4.1 4.5 4.7 4.8  CL 108  --  110 109 107 108  CO2 25  --  '28 28 27 27  '$ GLUCOSE 95  --  100* 107* 98 99  BUN 22*  --  22* 23* 23* 26*  CREATININE 1.23  --  1.45* 1.36* 1.24 1.19  CALCIUM 10.0  --  9.9 10.2 10.5* 10.5*  MG 1.5* 1.4* 1.4* 1.4* 1.7 1.8    GFR Estimated Creatinine Clearance: 59.9 mL/min (by C-G formula based on SCr of 1.19 mg/dL). Liver Function Tests: No results for input(s): AST, ALT, ALKPHOS, BILITOT, PROT, ALBUMIN in the last 168 hours. No results for input(s): LIPASE, AMYLASE in the last 168 hours. No results for input(s): AMMONIA in the last 168 hours. Coagulation profile No results for input(s): INR, PROTIME in the last 168 hours.  CBC:  Recent Labs Lab 11/10/16 0331 11/11/16 0439 11/12/16 0358  WBC 5.5 5.5 5.5  NEUTROABS 3.3 3.3 3.5  HGB 11.2* 11.0* 10.6*  HCT 34.6* 34.8* 33.4*  MCV 109.1* 107.4* 109.2*  PLT 204 226 223   Cardiac Enzymes: No results for input(s): CKTOTAL, CKMB, CKMBINDEX, TROPONINI in the last 168 hours. BNP (last 3 results) No results for input(s): PROBNP in the last 8760 hours. CBG: No results for input(s): GLUCAP in the last 168 hours. D-Dimer: No results for input(s): DDIMER in the last 72 hours. Hgb A1c: No results for input(s): HGBA1C in the last  72 hours. Lipid Profile: No results for input(s): CHOL, HDL, LDLCALC, TRIG, CHOLHDL, LDLDIRECT in the last 72 hours. Thyroid function studies: No results for input(s): TSH, T4TOTAL, T3FREE, THYROIDAB in the last 72 hours.  Invalid input(s): FREET3 Anemia work up: No results for input(s): VITAMINB12, FOLATE, FERRITIN, TIBC, IRON, RETICCTPCT in the last 72 hours. Sepsis Labs:  Recent Labs Lab 11/10/16 0331 11/11/16 0439 11/12/16 0358  WBC 5.5 5.5 5.5    Microbiology No results found for this or any previous visit (from the past 240 hour(s)).  Radiology: No results found.  Medications:   . aspirin EC  81 mg Oral q morning - 10a  . bisoprolol  10 mg Oral Daily  . budesonide (PULMICORT) nebulizer solution  0.25 mg Nebulization BID  . enoxaparin (LOVENOX) injection  40 mg Subcutaneous Daily  . ferrous sulfate  325 mg Oral Q breakfast  . furosemide  20 mg Oral BID  . guaiFENesin  600 mg Oral BID  . lisinopril  40 mg  Oral Daily  . magnesium oxide  400 mg Oral BID  . mouth rinse  15 mL Mouth Rinse BID  . multivitamin with minerals  1 tablet Oral Daily  . saccharomyces boulardii  250 mg Oral BID  . sertraline  50 mg Oral Daily  . thiamine  100 mg Oral Daily  . tiotropium  18 mcg Inhalation Daily  . vancomycin  500 mg Oral Q6H  . vitamin B-12  1,000 mcg Oral Daily   Continuous Infusions:  Medical decision making is of high complexity and this patient is at high risk of deterioration, therefore this is a level 3 visit.    LOS: 13 days   [facility] Pager [phone]. If unable to reach me by pager, please call my cell phone at [phone].  *Please refer to [email or web address], password [email or web address] to get updated schedule on who will round on this patient, as hospitalists switch teams weekly. If 7PM-7AM, please contact night-coverage at [email or web address], password [email or web address] for any overnight needs.  11/16/2016, 8:14 AM

## 2016-11-16 NOTE — Progress Notes (Signed)
Occupational Therapy Treatment Patient Details Name: David Barton MRN: [id number] DOB: 1933-04-23 Today's Date: 11/16/2016    History of present illness  ZEVEN KOCAK is a 82 y.o. male with medical history significant of Colon cancer,COPD, HTN who presents complaining of generalized weakness, unable to ambulate, diarrhea.     CT angio: RIGHT lower lobe consolidation concerning for pneumonia, possibly secondary to aspiration. Diffuse mild bronchial wall thickening can be seen with reactive airway dose disease or bronchitis. Secretions in the trachea and bronchi consistent with aspiration    OT comments  This 82 yo male admitted with above presents to acute OT making progress with basic ADLs but currently needs supplemental O2 during any activity due to O2 dropping (educated on purse lipped breathing and use of flutter valve--every hour that he is awake). He will continue to benefit from acute OT with follow up [place] to progress pt to his PLOF.  Follow Up Recommendations  Home health OT;Supervision/Assistance - 24 hour    Equipment Recommendations  3 in 1 bedside comode;Tub/shower bench;Other (comment) (wife aware that tub bench is an out of pocket expense)       Precautions / Restrictions Precautions Precautions: Fall Precaution Comments: monitor sats; resting sats on 5L 94%; 85 on 5 liters with ambulation back and forth in room ; back up to 91 % with increased time and purse lipped breathing (up to 97% post 10 reps of flutter valve, but dropped down to 93% all while on 5 liters)       Mobility Bed Mobility Overal bed mobility: Modified Independent Bed Mobility: Supine to Sit;Sit to Supine     Supine to sit: Min assist (HOB up and A up 1/3 of rise up due to bad right shoulder) Sit to supine: Supervision (HOB flat and no rail)      Transfers Overall transfer level: Needs assistance Equipment used: Rolling walker (2 wheeled) Transfers: Sit to/from Stand Sit to Stand:  Supervision              Balance Overall balance assessment: Needs assistance Sitting-balance support: No upper extremity supported;Feet supported Sitting balance-Leahy Scale: Good     Standing balance support: Bilateral upper extremity supported;During functional activity Standing balance-Leahy Scale: Poor                     ADL Overall ADL's : Needs assistance/impaired                       Lower Body Dressing Details (indicate cue type and reason): Pt reports his wife helps him with socks, but he does his pants, underwear and slip on shoes Toilet Transfer: Min guard;Ambulation Toilet Transfer Details (indicate cue type and reason): bed>door (x) then to sit on bed                         Cognition   Behavior During Therapy: [facility] for tasks assessed/performed Overall Cognitive Status: Within Functional Limits for tasks assessed                                    Pertinent Vitals/ Pain       Pain Assessment: No/denies pain         Frequency  Min 2X/week        Progress Toward Goals  OT Goals(current goals can now be found in the  care plan section)  Progress towards OT goals: Progressing toward goals     Plan Discharge plan needs to be updated       End of Session Equipment Utilized During Treatment: Gait belt;Rolling walker   Activity Tolerance Patient tolerated treatment well   Patient Left in bed;with call bell/phone within reach;with chair alarm set   Nurse Communication  (telemetry box battery low (in red)--she brought one to me)        Time: 2929-0903 OT Time Calculation (min): 23 min  Charges: OT General Charges $OT Visit: 1 Procedure OT Treatments $Self Care/Home Management : 23-37 mins  Almon Register [phone] 11/16/2016, 4:39 PM

## 2016-11-16 NOTE — Progress Notes (Signed)
Pt declined cpap again tonight.  Machine remains in room on standby.  Pt was encouraged to call should he change his mind.  Pt remains on 5lnc.  RT will monitor and assess as needed.

## 2016-11-17 ENCOUNTER — Inpatient Hospital Stay (HOSPITAL_COMMUNITY): Payer: Medicare Other

## 2016-11-17 DIAGNOSIS — A0472 Enterocolitis due to Clostridium difficile, not specified as recurrent: Secondary | ICD-10-CM

## 2016-11-17 DIAGNOSIS — J690 Pneumonitis due to inhalation of food and vomit: Secondary | ICD-10-CM

## 2016-11-17 DIAGNOSIS — G4730 Sleep apnea, unspecified: Secondary | ICD-10-CM

## 2016-11-17 LAB — BASIC METABOLIC PANEL
ANION GAP: 8 (ref 5–15)
BUN: 28 mg/dL — ABNORMAL HIGH (ref 6–20)
CALCIUM: 10.8 mg/dL — AB (ref 8.9–10.3)
CO2: 30 mmol/L (ref 22–32)
Chloride: 103 mmol/L (ref 101–111)
Creatinine, Ser: 1.2 mg/dL (ref 0.61–1.24)
GFR, EST NON AFRICAN AMERICAN: 55 mL/min — AB (ref >60)
Glucose, Bld: 109 mg/dL — ABNORMAL HIGH (ref 65–99)
Potassium: 4.6 mmol/L (ref 3.5–5.1)
SODIUM: 141 mmol/L (ref 135–145)

## 2016-11-17 MED ORDER — FUROSEMIDE 20 MG PO TABS
20.0000 mg | ORAL_TABLET | Freq: Once | ORAL | Status: AC
  Administered 2016-11-17: 20 mg via ORAL
  Filled 2016-11-17: qty 1

## 2016-11-17 MED ORDER — FUROSEMIDE 10 MG/ML IJ SOLN
20.0000 mg | Freq: Two times a day (BID) | INTRAMUSCULAR | Status: DC
Start: 2016-11-17 — End: 2016-11-20
  Administered 2016-11-17 – 2016-11-19 (×5): 20 mg via INTRAVENOUS
  Filled 2016-11-17 (×5): qty 2

## 2016-11-17 NOTE — Progress Notes (Signed)
PROGRESS NOTE  David Barton QMG:[id number] DOB: 17-Aug-1933 DOA: 11/03/2016 PCP: Vena Austria, MD  HPI/Recap of past 24 hours: Patient is an 82 year old male with past mental history of chronic systolic heart failure with EF of 35%, COPD not on any oxygen at home and colon cancer admitted 11/15 for weakness and watery bowel movement 4 days with a recent treatment as outpatient for UTI. Patient was found to have C. difficile colitis as well as a community-acquired pneumonia. He was started on treatment for both and has since completed antibiotics for community-acquired pneumonia. Of course, located by SVT as well as acute systolic heart failure. Cardiology consulted.  Today, no issues. Patient complains of only few bowel movements, but they're becoming more solid. Breathing much more comfortably (although on 5 L still)   Assessment/Plan: Principal Problem:   Sepsis ([place]): Secondary to C. difficile plus pneumonia-aspiration versus community-acquired. Patient met criteria for sepsis on admission given leukocytosis, hypotension, tachycardia and elevated lactic acid level with pulmonary and GI source.  Sepsis is since resolved. Patient is completed antibiotics for pneumonia. He will need to be on 2 weeks total of by mouth vancomycin after completion of other antibiotics, still has 5 days to go. Active Problems:   Acute respiratory failure ([name]) and patient with history of COPD Gold complicated factors of pneumonia and heart failure. Still on 5 L nasal cannula. Continue to diurese. Cardiology following. Completed antibiotics for pneumonia. Continue inhalers.    Community acquired pneumonia of right lung versus aspiration pneumonia with secondary dysphagia: Continue oral vancomycin and florastor.    Diarrhea secondary to C. difficile colitis: slowly improving. Continued on by mouth vancomycin    AKI (acute kidney injury) ([place])  In the setting of stage II chronic kidney disease: Secondary  to decreased perfusion in the setting of acute systolic heart failure. Has improved with diuresis continues.    Hyperkalemia: Resolved      Acute systolic CHF (congestive heart failure) ([name])/cardiomyopathy/elevated troponin: Noted left ventricular dysfunction with EF of 30-35 percent. Cardiology following. Recommendation is for follow-up stress test as an outpatient as breathing is better stabilized. Patient down about 10 pounds. Continue ACE inhibitor and beta blocker. Was on by mouth Lasix, have increased to IV 20 twice a day. At this time, no evidence of acute ischemia and this may be more demand ischemia versus troponin elevated in the setting of pulmonary issues and CHF.    PSVT (paroxysmal supraventricular tachycardia) [facility]): Seen by cardiologist. Continue aspirin. No evidence of hyperthyroidism. Lexus scan Myoview as outpatient if tolerance from COPD standpoint.    Sleep apnea: Does not tolerate CPAP   Code Status: DO NOT RESUSCITATE   Family Communication: Wife and daughter at the bedside   Disposition Plan:  Continue to diurese. Have increased Lasix. Home hopefully in a few days once oxygen requirements go down. I do expect he will have to go home with some oxygen though.     Consultants:  Cardiology   Procedures:   2-D echo done 11/21: Moderate to severe reduction of systolic function with EF of 30-35 percent. Not technically sufficient to allow evaluation of left ventricular diastolic dysfunction. Calcified mitral valve.  Antimicrobials:  Zithromax 11/15-11/21  Flagyl 11/16-11/23  Oral vancomycin 11/16-present  IV Rocephin 11/16-11/22   DVT prophylaxis:  Lovenox   Objective: Vitals:   11/16/16 2000 11/17/16 0511 11/17/16 0849 11/17/16 1253  BP:  137/64  125/71  Pulse:  81  60  Resp:  18  16  Temp:  99.8 F (37.7 C)  97.8 F (36.6 C)  TempSrc:  Oral  Oral  SpO2: 95% 92% 92% 97%  Weight:  104.3 kg (229 lb 15 oz)    Height:        Intake/Output Summary  (Last 24 hours) at 11/17/16 1332 Last data filed at 11/17/16 1305  Gross per 24 hour  Intake              480 ml  Output             1250 ml  Net             -770 ml   Filed Weights   11/15/16 0441 11/16/16 1500 11/17/16 0511  Weight: 104.5 kg (230 lb 6.1 oz) 104.8 kg (231 lb 0.7 oz) 104.3 kg (229 lb 15 oz)    Exam:   General:  Alert and oriented 3, no acute distress   Cardiovascular: regular rate and rhythm, S1-S2   Respiratory: decreased breath sounds throughout, more so bibasilar   Abdomen: soft, obese, nontender, positive bowel sounds   Musculoskeletal: 1+ pitting edema bilaterally   Skin: No skin breaks, tears or lesions  Psychiatry: patient is appropriate, no evidence of psychoses    Data Reviewed: CBC:  Recent Labs Lab 11/11/16 0439 11/12/16 0358  WBC 5.5 5.5  NEUTROABS 3.3 3.5  HGB 11.0* 10.6*  HCT 34.8* 33.4*  MCV 107.4* 109.2*  PLT 226 767   Basic Metabolic Panel:  Recent Labs Lab 11/12/16 0358 11/13/16 0526 11/14/16 0529 11/15/16 0528 11/16/16 0520 11/17/16 0539  NA  --  143 142 141 141 141  K  --  4.1 4.5 4.7 4.8 4.6  CL  --  110 109 107 108 103  CO2  --  _0 GLUCOSE  --  100* 107* 98 99 109*  BUN  --  22* 23* 23* 26* 28*  CREATININE  --  1.45* 1.36* 1.24 1.19 1.20  CALCIUM  --  9.9 10.2 10.5* 10.5* 10.8*  MG 1.4* 1.4* 1.4* 1.7 1.8  --    GFR: Estimated Creatinine Clearance: 59.4 mL/min (by C-G formula based on SCr of 1.2 mg/dL). Liver Function Tests: No results for input(s): AST, ALT, ALKPHOS, BILITOT, PROT, ALBUMIN in the last 168 hours. No results for input(s): LIPASE, AMYLASE in the last 168 hours. No results for input(s): AMMONIA in the last 168 hours. Coagulation Profile: No results for input(s): INR, PROTIME in the last 168 hours. Cardiac Enzymes: No results for input(s): CKTOTAL, CKMB, CKMBINDEX, TROPONINI in the last 168 hours. BNP (last 3 results) No results for input(s): PROBNP in the last 8760  hours. HbA1C: No results for input(s): HGBA1C in the last 72 hours. CBG: No results for input(s): GLUCAP in the last 168 hours. Lipid Profile: No results for input(s): CHOL, HDL, LDLCALC, TRIG, CHOLHDL, LDLDIRECT in the last 72 hours. Thyroid Function Tests: No results for input(s): TSH, T4TOTAL, FREET4, T3FREE, THYROIDAB in the last 72 hours. Anemia Panel: No results for input(s): VITAMINB12, FOLATE, FERRITIN, TIBC, IRON, RETICCTPCT in the last 72 hours. Urine analysis:    Component Value Date/Time   COLORURINE YELLOW 11/03/2016 1800   APPEARANCEUR CLEAR 11/03/2016 1800   LABSPEC 1.017 11/03/2016 1800   PHURINE 5.5 11/03/2016 1800   GLUCOSEU NEGATIVE 11/03/2016 1800   HGBUR NEGATIVE 11/03/2016 1800   BILIRUBINUR LARGE (A) 11/03/2016 1800   KETONESUR 15 (A) 11/03/2016 1800   PROTEINUR NEGATIVE 11/03/2016 1800   UROBILINOGEN 1.0 05/08/2013 1738  NITRITE NEGATIVE 11/03/2016 1800   LEUKOCYTESUR NEGATIVE 11/03/2016 1800   Sepsis Labs: _0 (procalcitonin:4,lacticidven:4)  )No results found for this or any previous visit (from the past 240 hour(s)).    Studies: Dg Chest Port 1 View  Result Date: 11/17/2016 CLINICAL DATA:  Shortness of breath EXAM: PORTABLE CHEST 1 VIEW COMPARISON:  11/12/2016 FINDINGS: Heart size is normal. Aortic atherosclerosis. The right lung is clear. There is abnormal density at the left lower chest probably related to a combination of volume loss and left effusion. Left base pneumonia not excluded. IMPRESSION: Abnormal density at the left lung base likely related to a combination of effusion and volume loss. Left lower lobe pneumonia not excluded. Electronically Signed   By: Nelson Chimes M.D.   On: 11/17/2016 07:10    Scheduled Meds: . aspirin EC  81 mg Oral q morning - 10a  . bisoprolol  10 mg Oral Daily  . budesonide (PULMICORT) nebulizer solution  0.25 mg Nebulization BID  . enoxaparin (LOVENOX) injection  40 mg Subcutaneous Daily  . ferrous  sulfate  325 mg Oral Q breakfast  . furosemide  20 mg Intravenous BID  . guaiFENesin  600 mg Oral BID  . lisinopril  40 mg Oral Daily  . magnesium oxide  400 mg Oral BID  . mouth rinse  15 mL Mouth Rinse BID  . multivitamin with minerals  1 tablet Oral Daily  . saccharomyces boulardii  250 mg Oral BID  . sertraline  50 mg Oral Daily  . thiamine  100 mg Oral Daily  . tiotropium  18 mcg Inhalation Daily  . vancomycin  500 mg Oral Q6H  . vitamin B-12  1,000 mcg Oral Daily    Continuous Infusions:   LOS: 14 days     Annita Brod, MD [facility] Pager [phone]  If 7PM-7AM, please contact night-coverage [email or web address] Password [email or web address] 11/17/2016, 1:32 PM

## 2016-11-17 NOTE — Progress Notes (Signed)
Patient Name: David Barton Date of Encounter: 11/17/2016  Primary Cardiologist: Dr. Serena Croissant Problem List     Principal Problem:   Sepsis [facility]) Active Problems:   Acute respiratory failure ([place])   COPD  GOLD II with reversibility   Essential hypertension   Community acquired pneumonia of right lung   Aspiration pneumonia ([name])   Diarrhea   AKI (acute kidney injury) ([place])   Hyperkalemia   Elevated troponin   Acute systolic CHF (congestive heart failure) ([name])   Cardiomyopathy ([name])   PSVT (paroxysmal supraventricular tachycardia) ([name])   Dysphagia   Renal insufficiency   Enteritis due to Clostridium difficile   Sleep apnea   Hypomagnesemia   Pulmonary edema    Subjective   Breathing improved (but remains on 5L [place]). Denies any chest discomfort or palpitations. Wife and Daughter at the bedside.   Inpatient Medications    Scheduled Meds: . aspirin EC  81 mg Oral q morning - 10a  . bisoprolol  10 mg Oral Daily  . budesonide (PULMICORT) nebulizer solution  0.25 mg Nebulization BID  . enoxaparin (LOVENOX) injection  40 mg Subcutaneous Daily  . ferrous sulfate  325 mg Oral Q breakfast  . furosemide  20 mg Oral BID  . guaiFENesin  600 mg Oral BID  . lisinopril  40 mg Oral Daily  . magnesium oxide  400 mg Oral BID  . mouth rinse  15 mL Mouth Rinse BID  . multivitamin with minerals  1 tablet Oral Daily  . saccharomyces boulardii  250 mg Oral BID  . sertraline  50 mg Oral Daily  . thiamine  100 mg Oral Daily  . tiotropium  18 mcg Inhalation Daily  . vancomycin  500 mg Oral Q6H  . vitamin B-12  1,000 mcg Oral Daily   Continuous Infusions:  PRN Meds: acetaminophen **OR** acetaminophen, hydrALAZINE, ondansetron **OR** ondansetron (ZOFRAN) IV, traMADol   Vital Signs    Vitals:   11/16/16 1347 11/16/16 1500 11/16/16 2000 11/17/16 0511  BP: 125/66   137/64  Pulse: 63   81  Resp: 16   18  Temp: 98 F (36.7 C)   99.8 F (37.7 C)  TempSrc: Oral   Oral   SpO2: 93%  95% 92%  Weight:  231 lb 0.7 oz (104.8 kg)  229 lb 15 oz (104.3 kg)  Height:        Intake/Output Summary (Last 24 hours) at 11/17/16 0851 Last data filed at 11/17/16 0500  Gross per 24 hour  Intake              120 ml  Output             1000 ml  Net             -880 ml   Filed Weights   11/15/16 0441 11/16/16 1500 11/17/16 0511  Weight: 230 lb 6.1 oz (104.5 kg) 231 lb 0.7 oz (104.8 kg) 229 lb 15 oz (104.3 kg)    Physical Exam    GEN: Overweight Caucasian male currently in no acute distress.  HEENT: Grossly normal.  Neck: Supple, no JVD, carotid bruits, or masses. Cardiac: RRR, no murmurs, rubs, or gallops. No clubbing, cyanosis, edema.  Radials/DP/PT 2+ and equal bilaterally.  Respiratory:  Respirations regular and unlabored, mild expiratory wheezing in upper lung fields. No rales appreciated. GI: Soft, nontender, nondistended, BS + x 4. MS: no deformity or atrophy. Skin: warm and dry, no rash. Neuro:  Strength and sensation are intact. Psych: AAOx3.  Normal affect.  Labs    CBC No results for input(s): WBC, NEUTROABS, HGB, HCT, MCV, PLT in the last 72 hours. Basic Metabolic Panel  Recent Labs  11/15/16 0528 11/16/16 0520 11/17/16 0539  NA 141 141 141  K 4.7 4.8 4.6  CL 107 108 103  CO2 '27 27 30  '$ GLUCOSE 98 99 109*  BUN 23* 26* 28*  CREATININE 1.24 1.19 1.20  CALCIUM 10.5* 10.5* 10.8*  MG 1.7 1.8  --     Telemetry    NSR, HR in 60's-80's. Frequent PVC's and PAC's.  - Personally Reviewed  ECG    No new tracings.   Radiology    Dg Chest Port 1 View  Result Date: 11/17/2016 CLINICAL DATA:  Shortness of breath EXAM: PORTABLE CHEST 1 VIEW COMPARISON:  11/12/2016 FINDINGS: Heart size is normal. Aortic atherosclerosis. The right lung is clear. There is abnormal density at the left lower chest probably related to a combination of volume loss and left effusion. Left base pneumonia not excluded. IMPRESSION: Abnormal density at the left lung base  likely related to a combination of effusion and volume loss. Left lower lobe pneumonia not excluded. Electronically Signed   By: Nelson Chimes M.D.   On: 11/17/2016 07:10    Cardiac Studies   Echocardiogram: 11/09/2016 Study Conclusions  - Left ventricle: The cavity size was normal. Systolic function was   moderately to severely reduced. The estimated ejection fraction   was in the range of 30% to 35%. Regional wall motion   abnormalities cannot be excluded. The study is not technically   sufficient to allow evaluation of LV diastolic function. - Mitral valve: Calcified annulus.  Impressions:  - Technically difficult; definity used; EF difficult to quantitate   but likely moderate to severely reduced; suggest MUGA or cardiac   MRI to further assess.  Patient Profile     82 y.o. Male w/ PMH of COPD, colon CA, alcohol abuse, former tobacco abuse, HTN, obesity, DNR presented to [facility] with generalized weakness, unable to ambulate, cough, and mild diarrhea - found to acute hpoxic respiratory failure, sepsis due to aspiration PNA, AKI with Cr 1.73, hyperkalemia 5.4, leukocytosis 14.8, mild macroytic anemia, and C diff colitis. Cardiology following for SVT, also found to have elevated troponin (0.18) and new LV dysfunction (EF 30-35% by echo 11/09/16).   Assessment & Plan    1. Multiple medical issues including sepsis, C diff colitis, aspiration PNA, acute hypoxic respiratory failure  - per admitting team.  2. Elevated troponin  - values have been flat at 0.18 and 0.14. Suspect demand process in the setting of above. Has never been evaluated from an ischemic standpoint.  - can consider Lexiscan Myoview as an outpatient but would allow him to improve from COPD standpoint. Denies any recent chest discomfort.   3. SVT  - improved with defervescence of acute illness. He did have a 3.8 sec pause on 11/14/2016 but had received PO dilt and new bisoprolol. Diltiazem has been discontinued due  to LV dysfunction.  - no recurrent pauses noted. Denies any symptoms related to this. TSH elevated to 5.734, T4 normal.   4. Acute systolic CHF  - with new recognition of LV dysfunction (EF 30-35%) - etiology unclear, could be viral/septic, related to alcohol use or possibly ischemic in nature. Noninvasive ischemic eval as above in the outpatient setting.  - weight down from 240 lbs on admission to 229 lbs on 11/17/2016.  Given additional dose of IV Lasix on 11/28 after CXR imaging was reviewed. CXR today shows density of LLB likely related to combination of effusion and volume loss, LLL PNA not excluded. Does not appear volume overloaded on physical exam. Would continue with PO Lasix dosing. - Continue ACE-I, BB, and PO Lasix '20mg'$  BID.   5. Alcohol abuse  - patient was somewhat guarded when discussing intake during initial consult. Advised to obstain from further use at this time.  6. Acute kidney injury  - baseline unclear, normal in 2014. Creatinine improved to 1.20 this AM.   7. Hypomagnesemia  - Mg at 1.8 on 11/16/2016. Continue MagOx '400mg'$  BID. Do not want to increase higher than this as we don't want to potentiate more diarrhea.  Signed, Erma Heritage, PA  11/17/2016, 8:51 AM   I have personally seen and examined this patient with Bernerd Pho, PA-C. I agree with the assessment and plan as outlined above. He is admitted with sepsis due to PNA, C diff colitis. Elevated troponin likely due to demand ischemia. Moderate LV systolic dysfunction. No plans for invasive cardiac evaluation. Medical management of cardiomyopathy. No recurrence of SVT. Continue bisoprolol.   No new cardiac recs at this time.   Lauree Chandler 11/17/2016

## 2016-11-17 NOTE — Care Management Note (Signed)
Case Management Note  Patient Details  Name: David Barton MRN: [id number] Date of Birth: 20-Mar-1933  Subjective/Objective:[facility] dme rep-delivered home 02 to rm. [place] rep-[place] referral-Awaiting [place] orders-HHRN/PT/OT/aide/sw,[id number],shower chair,3n1. CSW still following if d/c plan becomes SNF.                   Action/Plan:d/c plan home w/HHC.   Expected Discharge Date:   (unknown)               [place]:  [place] Referral:  Clinical Social Work  Discharge planning Services  CM Consult  Post Acute Care Choice:    Choice offered to:  Patient  DME Arranged:  Oxygen DME Agency:     HH [place]    [place]  [place] Status of Service:  In process, will continue to follow  If discussed at Long Length of Stay Meetings, dates discussed:    Additional Comments:  MDessa Phi RN 11/17/2016, 2:06 PM

## 2016-11-17 NOTE — Progress Notes (Signed)
Physical Therapy Treatment Patient Details Name: David Barton MRN: [id number] DOB: Dec 25, 1932 Today's Date: 11/17/2016    History of Present Illness  David Barton is a 82 y.o. male with medical history significant of Colon cancer,COPD, HTN who presents complaining of generalized weakness, unable to ambulate, diarrhea.     CT angio: RIGHT lower lobe consolidation concerning for pneumonia, possibly secondary to aspiration. Diffuse mild bronchial wall thickening can be seen with reactive airway dose disease or bronchitis. Secretions in the trachea and bronchi consistent with aspiration     PT Comments    Progressing with mobility.  O2: 95% 5L O2 at rest start of session; 97% on 6L O2 amb/84% on 4L O2 amb; 91% 5L O2 end of session. Pt participated well with therapy.    Follow Up Recommendations  Home health PT;Supervision/Assistance - 24 hour (family has declined SNF placement)     Equipment Recommendations  Rolling walker with 5" wheels    Recommendations for Other Services       Precautions / Restrictions Precautions Precautions: Fall Precaution Comments: monitor sats Restrictions Weight Bearing Restrictions: No    Mobility  Bed Mobility         Supine to sit: Supervision;HOB elevated Sit to supine: Supervision;HOB elevated   General bed mobility comments: for lines  Transfers Overall transfer level: Needs assistance Equipment used: Rolling walker (2 wheeled) Transfers: Sit to/from Stand Sit to Stand: Supervision;From elevated surface         General transfer comment: VC's for UE placement.   Ambulation/Gait Ambulation/Gait assistance: Min guard Ambulation Distance (Feet): 100 Feet Assistive device: Rolling walker (2 wheeled) Gait Pattern/deviations: Step-through pattern;Decreased stride length     General Gait Details: Dyspnea 2/4. Pt tolerated distance well. Close guard for safety.    Stairs            Wheelchair Mobility    Modified  [place] (Stroke Patients Only)       Balance                                    Cognition Arousal/Alertness: Awake/alert Behavior During Therapy: WFL for tasks assessed/performed Overall Cognitive Status: Within Functional Limits for tasks assessed                      Exercises      General Comments        Pertinent Vitals/Pain Pain Assessment: No/denies pain    Home Living                      Prior Function            PT Goals (current goals can now be found in the care plan section) Progress towards PT goals: Progressing toward goals    Frequency    Min 3X/week      PT Plan Current plan remains appropriate    Co-evaluation             End of Session Equipment Utilized During Treatment: Oxygen Activity Tolerance: Patient tolerated treatment well Patient left: in bed;with call bell/phone within reach     Time: 1425-1441 PT Time Calculation (min) (ACUTE ONLY): 16 min  Charges:  $Gait Training: 8-22 mins                    G Codes:      Weston Anna, MPT Pager:  [phone]    

## 2016-11-17 NOTE — Progress Notes (Signed)
CSW spoke with patient's wife at bedside & provided information on SCAT/transportation resources. Patient's wife states that she plans to take patient home at discharge, though will [facility] today incase they need him moved to SNF from home.   No further CSW needs identified - CSW signing off.   Raynaldo Opitz, [facility] Clinical Social Worker cell #: [phone]

## 2016-11-17 NOTE — Progress Notes (Signed)
Visit made to patients room to offer Cpap. Pt refused.  RN made aware.

## 2016-11-17 NOTE — Progress Notes (Signed)
Pt refused CPAP at bedtime.

## 2016-11-18 DIAGNOSIS — N179 Acute kidney failure, unspecified: Secondary | ICD-10-CM

## 2016-11-18 DIAGNOSIS — A419 Sepsis, unspecified organism: Secondary | ICD-10-CM

## 2016-11-18 LAB — CREATININE, SERUM
Creatinine, Ser: 1.23 mg/dL (ref 0.61–1.24)
GFR calc Af Amer: >60 mL/min (ref >60)
GFR, EST NON AFRICAN AMERICAN: 54 mL/min — AB (ref >60)

## 2016-11-18 NOTE — Progress Notes (Signed)
Patient oxygen decreased to three liters. Oxygen saturation is 91% on 3L. Patient has no complaints. Will continue to monitor.

## 2016-11-18 NOTE — Progress Notes (Signed)
PROGRESS NOTE  David Barton EGB:[id number] DOB: 11-09-1933 DOA: 11/03/2016 PCP: Vena Austria, MD  HPI/Recap of past 24 hours: Patient is an 82 year old male with past mental history of chronic systolic heart failure with EF of 35%, COPD not on any oxygen at home and colon cancer admitted 11/15 for weakness and watery bowel movement 4 days with a recent treatment as outpatient for UTI. Patient was found to have C. difficile colitis as well as a community-acquired pneumonia. He was started on treatment for both and has since completed antibiotics for community-acquired pneumonia. Of course, located by SVT as well as acute systolic heart failure. Cardiology consulted.  Nose bowel movements. Patient's breathing continues to improve and has been able to go down to 4 L nasal cannula  (5L yesterday ). No other complaints.  Assessment/Plan: Principal Problem:   Sepsis ([place]): Secondary to C. difficile plus pneumonia-aspiration versus community-acquired. Patient met criteria for sepsis on admission given leukocytosis, hypotension, tachycardia and elevated lactic acid level with pulmonary and GI source.  Sepsis is since resolved. Patient is completed antibiotics for pneumonia. He will need to be on 2 weeks total of by mouth vancomycin after completion of other antibiotics, still has 4 days to go. Active Problems:   Acute respiratory failure ([name]) and patient with history of COPD Gold complicated factors of pneumonia and heart failure. Down to 4 L nasal cannula. Encouraged and spirometry use. Continue to diurese. Cardiology formally signed off. Completed antibiotics for pneumonia. Continue inhalers.    Community acquired pneumonia of right lung versus aspiration pneumonia with secondary dysphagia: Continue oral vancomycin and florastor.    Diarrhea secondary to C. difficile colitis: slowly improving. Continued on by mouth vancomycin    AKI (acute kidney injury) ([place])  In the setting of stage  II chronic kidney disease: Secondary to decreased perfusion in the setting of acute systolic heart failure. Has improved with diuresis continues now back to baseline    Hyperkalemia: Resolved      Acute systolic CHF (congestive heart failure) ([name])/cardiomyopathy/elevated troponin: Noted left ventricular dysfunction with EF of 30-35 percent. Cardiology following. Recommendation is for follow-up stress test as an outpatient as breathing is better stabilized. Patient down 13 pounds from admission. Continue ACE inhibitor and beta blocker. Was on by mouth Lasix, have increased to IV 20 twice a day. At this time, no evidence of acute ischemia and this may be more demand ischemia versus troponin elevated in the setting of pulmonary issues and CHF.    PSVT (paroxysmal supraventricular tachycardia) [facility]): Seen by cardiologist. Continue aspirin. No evidence of hyperthyroidism. Lexus scan Myoview as outpatient if tolerance from COPD standpoint.    Sleep apnea: Does not tolerate CPAP   Code Status: DO NOT RESUSCITATE   Family Communication: Wife and daughter at the bedside   Disposition Plan:  Continue to diurese. Continue to titrate down oxygen. Anticipate discharge in the next 24-48 hours, once he is down to 2 L nasal cannula    Consultants:  Cardiology   Procedures:   2-D echo done 11/21: Moderate to severe reduction of systolic function with EF of 30-35 percent. Not technically sufficient to allow evaluation of left ventricular diastolic dysfunction. Calcified mitral valve.  Antimicrobials:  Zithromax 11/15-11/21  Flagyl 11/16-11/23  Oral vancomycin 11/16-present  IV Rocephin 11/16-11/22   DVT prophylaxis:  Lovenox   Objective: Vitals:   11/17/16 2129 11/18/16 0500 11/18/16 0813 11/18/16 1100  BP: 124/62 127/65    Pulse: 65 67    Resp: 18  18    Temp: 98.5 F (36.9 C) 98.6 F (37 C)    TempSrc: Oral Oral    SpO2: 97% 97% 93% 92%  Weight:  103.4 kg (227 lb 15.3 oz)      Height:        Intake/Output Summary (Last 24 hours) at 11/18/16 1210 Last data filed at 11/18/16 1100  Gross per 24 hour  Intake              120 ml  Output             2250 ml  Net            -2130 ml   Filed Weights   11/16/16 1500 11/17/16 0511 11/18/16 0500  Weight: 104.8 kg (231 lb 0.7 oz) 104.3 kg (229 lb 15 oz) 103.4 kg (227 lb 15.3 oz)    Exam:   General:  Alert and oriented 3, no acute distress   Cardiovascular: regular rate and rhythm, S1-S2   Respiratory: decreased breath sounds throughout  Abdomen: soft, obese, nontender, positive bowel sounds   Musculoskeletal: 1+ pitting edema bilaterally   Skin: No skin breaks, tears or lesions  Psychiatry: patient is appropriate, no evidence of psychoses    Data Reviewed: CBC:  Recent Labs Lab 11/12/16 0358  WBC 5.5  NEUTROABS 3.5  HGB 10.6*  HCT 33.4*  MCV 109.2*  PLT 166   Basic Metabolic Panel:  Recent Labs Lab 11/12/16 0358  11/13/16 0526 11/14/16 0529 11/15/16 0528 11/16/16 0520 11/17/16 0539 11/18/16 0543  NA  --   --  143 142 141 141 141  --   K  --   --  4.1 4.5 4.7 4.8 4.6  --   CL  --   --  110 109 107 108 103  --   CO2  --   --  _0 --   GLUCOSE  --   --  100* 107* 98 99 109*  --   BUN  --   --  22* 23* 23* 26* 28*  --   CREATININE  --   < > 1.45* 1.36* 1.24 1.19 1.20 1.23  CALCIUM  --   --  9.9 10.2 10.5* 10.5* 10.8*  --   MG 1.4*  --  1.4* 1.4* 1.7 1.8  --   --   < > = values in this interval not displayed. GFR: Estimated Creatinine Clearance: 57.7 mL/min (by C-G formula based on SCr of 1.23 mg/dL). Liver Function Tests: No results for input(s): AST, ALT, ALKPHOS, BILITOT, PROT, ALBUMIN in the last 168 hours. No results for input(s): LIPASE, AMYLASE in the last 168 hours. No results for input(s): AMMONIA in the last 168 hours. Coagulation Profile: No results for input(s): INR, PROTIME in the last 168 hours. Cardiac Enzymes: No results for input(s): CKTOTAL, CKMB,  CKMBINDEX, TROPONINI in the last 168 hours. BNP (last 3 results) No results for input(s): PROBNP in the last 8760 hours. HbA1C: No results for input(s): HGBA1C in the last 72 hours. CBG: No results for input(s): GLUCAP in the last 168 hours. Lipid Profile: No results for input(s): CHOL, HDL, LDLCALC, TRIG, CHOLHDL, LDLDIRECT in the last 72 hours. Thyroid Function Tests: No results for input(s): TSH, T4TOTAL, FREET4, T3FREE, THYROIDAB in the last 72 hours. Anemia Panel: No results for input(s): VITAMINB12, FOLATE, FERRITIN, TIBC, IRON, RETICCTPCT in the last 72 hours. Urine analysis:    Component Value Date/Time   COLORURINE YELLOW 11/03/2016  [place] 11/03/2016 1800   LABSPEC 1.017 11/03/2016 1800   PHURINE 5.5 11/03/2016 1800   GLUCOSEU NEGATIVE 11/03/2016 1800   HGBUR NEGATIVE 11/03/2016 1800   BILIRUBINUR LARGE (A) 11/03/2016 1800   KETONESUR 15 (A) 11/03/2016 1800   PROTEINUR NEGATIVE 11/03/2016 1800   UROBILINOGEN 1.0 05/08/2013 1738   NITRITE NEGATIVE 11/03/2016 1800   LEUKOCYTESUR NEGATIVE 11/03/2016 1800   Sepsis Labs: _0 (procalcitonin:4,lacticidven:4)  )No results found for this or any previous visit (from the past 240 hour(s)).    Studies: No results found.  Scheduled Meds: . aspirin EC  81 mg Oral q morning - 10a  . bisoprolol  10 mg Oral Daily  . budesonide (PULMICORT) nebulizer solution  0.25 mg Nebulization BID  . enoxaparin (LOVENOX) injection  40 mg Subcutaneous Daily  . ferrous sulfate  325 mg Oral Q breakfast  . furosemide  20 mg Intravenous BID  . guaiFENesin  600 mg Oral BID  . lisinopril  40 mg Oral Daily  . magnesium oxide  400 mg Oral BID  . mouth rinse  15 mL Mouth Rinse BID  . multivitamin with minerals  1 tablet Oral Daily  . saccharomyces boulardii  250 mg Oral BID  . sertraline  50 mg Oral Daily  . thiamine  100 mg Oral Daily  . tiotropium  18 mcg Inhalation Daily  . vancomycin  500 mg Oral Q6H  . vitamin B-12   1,000 mcg Oral Daily    Continuous Infusions:   LOS: 15 days     Annita Brod, MD [facility] Pager [phone]  If 7PM-7AM, please contact night-coverage [email or web address] Password [email or web address] 11/18/2016, 12:10 PM

## 2016-11-18 NOTE — Progress Notes (Signed)
If patient stays past Friday will need 02 sats checked again to confirm home 02 needed.

## 2016-11-18 NOTE — Progress Notes (Signed)
PT Cancellation Note  Patient Details Name: David Barton MRN: [id number] DOB: February 08, 1933   Cancelled Treatment:    Reason Eval/Treat Not Completed: Attempted PT tx session. Pt politely declined. He reported he walked with nursing in hallway earlier today. Will check back another day. Thanks.    Weston Anna, MPT Pager: [phone]

## 2016-11-18 NOTE — Progress Notes (Signed)
Patient ambulated with walker on 4 liters of oxygen. Patient oxygen saturation was 92% ambulating and at rest. Patient had no complaints. Will to continue to monitor.

## 2016-11-18 NOTE — Care Management Note (Signed)
Case Management Note  Patient Details  Name: David Barton MRN: [id number] Date of Birth: Feb 20, 1933  Subjective/Objective: Spoke to spouse [name] in phone to confirm all HHC/DME-Home 02 already in rm-she wants HHRN/PT/OT/aide/sw;RW,3n1,tub bench/shower chair(she wants to look @ both of these -then decide which 1 she wants) [facility] rep Manuela Schwartz aware of HHC(she has also spoke to spouse about HHC/co pay,services), [facility] dme rep [place]-informed her of potential d/c Fri/Sat/Sun. MD aware of HHC/dme orders to be placed. Spoke to spouse [name] [phone] several times about d/c plans-she may want ambulance transp @ d/c-she will decide.                   Action/Plan:d/c plan home w/HHC/DME.   Expected Discharge Date:   (unknown)               Expected Discharge Plan:  [place]  In-House Referral:  Clinical Social Work  Discharge planning Services  CM Consult  Post Acute Care Choice:    Choice offered to:  Patient  DME Arranged:  Oxygen DME Agency:     HH Arranged:    [place] Agency:  [place]  Status of Service:  In process, will continue to follow  If discussed at Long Length of Stay Meetings, dates discussed:    Additional Comments:  Dessa Phi, RN 11/18/2016, 11:03 AM

## 2016-11-18 NOTE — Progress Notes (Signed)
Patient Name: David Barton Date of Encounter: 11/18/2016  Primary Cardiologist: Dr. Serena Croissant Problem List     Principal Problem:   Sepsis [facility]) Active Problems:   Acute respiratory failure ([place])   COPD  GOLD II with reversibility   Essential hypertension   Community acquired pneumonia of right lung   Aspiration pneumonia ([name])   Diarrhea   AKI (acute kidney injury) ([place])   Hyperkalemia   Elevated troponin   Acute systolic CHF (congestive heart failure) ([name])   Cardiomyopathy ([name])   PSVT (paroxysmal supraventricular tachycardia) ([name])   Dysphagia   Renal insufficiency   Enteritis due to Clostridium difficile   Sleep apnea   Hypomagnesemia   Pulmonary edema    Subjective   Ambulated over 100 ft with PT yesterday. Denies any chest discomfort or palpitations. Says he feels like he is slowly gaining his strength back.   Inpatient Medications    Scheduled Meds: . aspirin EC  81 mg Oral q morning - 10a  . bisoprolol  10 mg Oral Daily  . budesonide (PULMICORT) nebulizer solution  0.25 mg Nebulization BID  . enoxaparin (LOVENOX) injection  40 mg Subcutaneous Daily  . ferrous sulfate  325 mg Oral Q breakfast  . furosemide  20 mg Intravenous BID  . guaiFENesin  600 mg Oral BID  . lisinopril  40 mg Oral Daily  . magnesium oxide  400 mg Oral BID  . mouth rinse  15 mL Mouth Rinse BID  . multivitamin with minerals  1 tablet Oral Daily  . saccharomyces boulardii  250 mg Oral BID  . sertraline  50 mg Oral Daily  . thiamine  100 mg Oral Daily  . tiotropium  18 mcg Inhalation Daily  . vancomycin  500 mg Oral Q6H  . vitamin B-12  1,000 mcg Oral Daily   Continuous Infusions:  PRN Meds: acetaminophen **OR** acetaminophen, hydrALAZINE, ondansetron **OR** ondansetron (ZOFRAN) IV, traMADol   Vital Signs    Vitals:   11/17/16 1253 11/17/16 1930 11/17/16 2129 11/18/16 0500  BP: 125/71  124/62 127/65  Pulse: 60  65 67  Resp: '16  18 18  '$ Temp: 97.8 F (36.6 C)   98.5 F (36.9 C) 98.6 F (37 C)  TempSrc: Oral  Oral Oral  SpO2: 97% 96% 97% 97%  Weight:    227 lb 15.3 oz (103.4 kg)  Height:        Intake/Output Summary (Last 24 hours) at 11/18/16 0736 Last data filed at 11/18/16 0600  Gross per 24 hour  Intake              480 ml  Output             1850 ml  Net            -1370 ml   Filed Weights   11/16/16 1500 11/17/16 0511 11/18/16 0500  Weight: 231 lb 0.7 oz (104.8 kg) 229 lb 15 oz (104.3 kg) 227 lb 15.3 oz (103.4 kg)    Physical Exam    GEN: Overweight Caucasian male appearing in no acute distress.  HEENT: Grossly normal.  Neck: Supple, no JVD, carotid bruits, or masses. Cardiac: RRR, no murmurs, rubs, or gallops. No clubbing or cyanosis. Trace lower extremity edema.  Radials/DP/PT 2+ and equal bilaterally.  Respiratory:  Respirations regular and unlabored, expiratory wheezing appreciated in upper lung fields. No rales appreciated. GI: Soft, nontender, nondistended, BS + x 4. MS: no deformity or atrophy. Skin: warm  and dry, no rash. Neuro:  Strength and sensation are intact. Psych: AAOx3.  Normal affect.  Labs    CBC No results for input(s): WBC, NEUTROABS, HGB, HCT, MCV, PLT in the last 72 hours. Basic Metabolic Panel  Recent Labs  11/16/16 0520 11/17/16 0539 11/18/16 0543  NA 141 141  --   K 4.8 4.6  --   CL 108 103  --   CO2 27 30  --   GLUCOSE 99 109*  --   BUN 26* 28*  --   CREATININE 1.19 1.20 1.23  CALCIUM 10.5* 10.8*  --   MG 1.8  --   --     Telemetry    NSR, HR in mid-50's - 60's. Occasional PVC's.  - Personally Reviewed  ECG    No new tracings.   Radiology    Dg Chest Port 1 View  Result Date: 11/17/2016 CLINICAL DATA:  Shortness of breath EXAM: PORTABLE CHEST 1 VIEW COMPARISON:  11/12/2016 FINDINGS: Heart size is normal. Aortic atherosclerosis. The right lung is clear. There is abnormal density at the left lower chest probably related to a combination of volume loss and left effusion. Left  base pneumonia not excluded. IMPRESSION: Abnormal density at the left lung base likely related to a combination of effusion and volume loss. Left lower lobe pneumonia not excluded. Electronically Signed   By: Nelson Chimes M.D.   On: 11/17/2016 07:10    Cardiac Studies   Echocardiogram: 11/09/2016 Study Conclusions  - Left ventricle: The cavity size was normal. Systolic function was   moderately to severely reduced. The estimated ejection fraction   was in the range of 30% to 35%. Regional wall motion   abnormalities cannot be excluded. The study is not technically   sufficient to allow evaluation of LV diastolic function. - Mitral valve: Calcified annulus.  Impressions:  - Technically difficult; definity used; EF difficult to quantitate   but likely moderate to severely reduced; suggest MUGA or cardiac   MRI to further assess.  Patient Profile     82 y.o. Male w/ PMH of COPD, colon CA, alcohol abuse, former tobacco abuse, HTN, obesity, DNR presented to [facility] with generalized weakness, unable to ambulate, cough, and mild diarrhea - found to acute hpoxic respiratory failure, sepsis due to aspiration PNA, AKI with Cr 1.73, hyperkalemia 5.4, leukocytosis 14.8, mild macroytic anemia, and C diff colitis. Cardiology following for SVT, also found to have elevated troponin (0.18) and new LV dysfunction (EF 30-35% by echo 11/09/16).   Assessment & Plan    1. Multiple medical issues including sepsis, C diff colitis, aspiration PNA, acute hypoxic respiratory failure  - per admitting team.  2. Elevated troponin  - values have been flat at 0.18 and 0.14. Suspect demand process in the setting of above. Has never been evaluated from an ischemic standpoint.  - can consider Lexiscan Myoview as an outpatient but would allow him to improve from COPD standpoint. Denies any recent chest discomfort.   3. SVT  - improved with defervescence of acute illness. He did have a 3.8 sec pause on 11/14/2016  but had received PO dilt and new bisoprolol. Diltiazem has been discontinued due to LV dysfunction.  - no recurrent pauses noted. Denies any symptoms related to this. TSH elevated to 5.734, T4 normal.  - continue Bisoprolol '10mg'$  daily. Would not further titrate at this time with HR in mid-50's overnight.   4. Acute systolic CHF  - with new recognition of LV dysfunction (  EF 30-35%) - etiology unclear, could be viral/septic, related to alcohol use or possibly ischemic in nature. Noninvasive ischemic eval as above in the outpatient setting.  - weight down from 240 lbs on admission to 227 lbs on 11/18/2016. Restarted on IV Lasix '20mg'$  BID by admitting team as his O2 requirement remains at 5L. He does not appear volume overloaded on physical exam. Follow kidney function closely to avoid over diuresis.  - Continue ACE-I, BB, and PO Lasix '20mg'$  BID.   5. Alcohol abuse  - patient was somewhat guarded when discussing intake during initial consult. Advised to obstain from further use at this time.  6. Acute kidney injury  - baseline unclear, normal in 2014. Creatinine improved to 1.23 this AM.   7. Hypomagnesemia  - Mg at 1.8 on 11/16/2016. Continue MagOx '400mg'$  BID.   Signed, Erma Heritage, PA  11/18/2016, 7:36 AM    I have personally seen and examined this patient with Bernerd Pho, PA-C. I agree with the assessment and plan as outlined above. He is admitted with sepsis due to PNA, C diff colitis. Elevated troponin likely due to demand ischemia. Moderate LV systolic dysfunction. No plans for invasive cardiac evaluation. Medical management of cardiomyopathy. No recurrence of SVT. Continue bisoprolol. He does not appear volume overloaded.   Will sign off. We will arrange f/u in our office with Dr. Gwenlyn Found for 1-2 weeks post discharge.   Lauree Chandler 11/18/2016 11:01 AM

## 2016-11-19 LAB — BASIC METABOLIC PANEL
Anion gap: 8 (ref 5–15)
BUN: 30 mg/dL — AB (ref 6–20)
CHLORIDE: 101 mmol/L (ref 101–111)
CO2: 32 mmol/L (ref 22–32)
CREATININE: 1.27 mg/dL — AB (ref 0.61–1.24)
Calcium: 10.6 mg/dL — ABNORMAL HIGH (ref 8.9–10.3)
GFR calc Af Amer: 60 mL/min — ABNORMAL LOW (ref >60)
GFR, EST NON AFRICAN AMERICAN: 52 mL/min — AB (ref >60)
GLUCOSE: 100 mg/dL — AB (ref 65–99)
POTASSIUM: 4.3 mmol/L (ref 3.5–5.1)
Sodium: 141 mmol/L (ref 135–145)

## 2016-11-19 NOTE — Care Management Note (Signed)
Case Management Note  Patient Details  Name: TIGHE GITTO MRN: [id number] Date of Birth: 01-13-1933  Subjective/Objective:  Spoke to spouse again about HHC/DME-all orders are in-HHRN/PT/OT/aide/sw-rep [name] aware;HH DME-3n1,rw,tub transfer bench-already brought to rm-orders in. 02 sats qualified for home 02, home 02 ordered;home 02 already in rm. [place] director has also spoke to spouse to allay all fears about d/c in am. Spouse has private duty care-[employer]-rep has already come to assess patient.  Spouse currently says she can transp home on own.                   Action/Plan:d/c plan home w/HHC/DME   Expected Discharge Date:   (unknown)               Expected Discharge Plan:  [place]  In-House Referral:  Clinical Social Work  Discharge planning Services  CM Consult  Post Acute Care Choice:    Choice offered to:  Patient  DME Arranged:  Oxygen, 3-N-1, Walker rolling, Tub bench DME Agency:  [place]:  PT, OT, Nurse's Aide, Social Work [employer] Agency:  [place]  Status of Service:  In process, will continue to follow  If discussed at Long Length of Stay Meetings, dates discussed:    Additional Comments:  Dessa Phi, RN 11/19/2016, 3:52 PM

## 2016-11-19 NOTE — Progress Notes (Signed)
Pt refused CPAP - QHS.  Education provided.  RT will continue to monitor as needed,

## 2016-11-19 NOTE — Progress Notes (Signed)
SATURATION QUALIFICATIONS: (This note is used to comply with regulatory documentation for home oxygen)  Patient Saturations on Room Air at Rest = 87%  Patient Saturations on Room Air while Ambulating = %  Patient Saturations on  Liters of oxygen while Ambulating = %  Please briefly explain why patient needs home oxygen:

## 2016-11-19 NOTE — Care Management Note (Signed)
Case Management Note  Patient Details  Name: KYVON HU MRN: [id number] Date of Birth: 1933/08/06  Subjective/Objective:  [facility] dme rep has already delivered 3n1,transfer tub bench, rw into rm; home 02 already delivered. Awaiting new set of 02 sats to be documented. Per spouse will transport home on own.Awaiting HHRN/PT/OT/aide/sw,f74forders,& dme orders-3n1,transfer tub bench,rw. [facility] rep [place]antiago Gladaware of d/c in am.                 Action/Plan:d/c home w/HHC/DME.   Expected Discharge Date:   (unknown)               Expected Discharge Plan:  [place]House Referral:  Clinical Social Work  Discharge planning Services  CM Consult  Post Acute Care Choice:    Choice offered to:  Patient  DME Arranged:  Oxygen DME Agency:     HH Arranged:    [place]  [place] Status of Service:  In process, will continue to follow  If discussed at Long Length of Stay Meetings, dates discussed:    Additional Comments:  MDessa Phi RN 11/19/2016, 12:27 PM

## 2016-11-19 NOTE — Progress Notes (Signed)
Physical Therapy Treatment Patient Details Name: DEMAREA LOREY MRN: [id number] DOB: 09/02/1933 Today's Date: 11/19/2016    History of Present Illness  David Barton is a 82 y.o. male with medical history significant of Colon cancer,COPD, HTN who presents complaining of generalized weakness, unable to ambulate, diarrhea.     CT angio: RIGHT lower lobe consolidation concerning for pneumonia, possibly secondary to aspiration. Diffuse mild bronchial wall thickening can be seen with reactive airway dose disease or bronchitis. Secretions in the trachea and bronchi consistent with aspiration     PT Comments    Progressing with mobility.  O2 sats: 91% 3L O2 rest, 85% 4L O2 during ambulation. 94% 3L O2 end of session  Follow Up Recommendations  Home health PT;Supervision/Assistance - 24 hour     Equipment Recommendations  Rolling walker with 5" wheels    Recommendations for Other Services       Precautions / Restrictions Precautions Precautions: Fall Precaution Comments: monitor sats Restrictions Weight Bearing Restrictions: No    Mobility  Bed Mobility         Supine to sit: Supervision Sit to supine: Supervision   General bed mobility comments: for lines  Transfers Overall transfer level: Needs assistance   Transfers: Sit to/from Stand Sit to Stand: Supervision         General transfer comment: VC's for UE placement.   Ambulation/Gait Ambulation/Gait assistance: Min guard Ambulation Distance (Feet): 75 Feet Assistive device: Rolling walker (2 wheeled) Gait Pattern/deviations: Step-through pattern     General Gait Details: Dyspnea 2/4. Pt tolerated distance well. Close guard for safety.    Stairs            Wheelchair Mobility    Modified Rankin (Stroke Patients Only)       Balance                                    Cognition Arousal/Alertness: Awake/alert Behavior During Therapy: WFL for tasks assessed/performed Overall  Cognitive Status: Within Functional Limits for tasks assessed                      Exercises      General Comments        Pertinent Vitals/Pain Pain Assessment: No/denies pain    Home Living                      Prior Function            PT Goals (current goals can now be found in the care plan section) Progress towards PT goals: Progressing toward goals    Frequency    Min 3X/week      PT Plan Current plan remains appropriate    Co-evaluation             End of Session Equipment Utilized During Treatment: Gait belt;Oxygen Activity Tolerance: Patient tolerated treatment well Patient left: in bed;with bed alarm set;with call bell/phone within reach     Time: 1125-1138 PT Time Calculation (min) (ACUTE ONLY): 13 min  Charges:  $Gait Training: 8-22 mins                    G Codes:      Weston Anna, MPT Pager: [phone]

## 2016-11-19 NOTE — Progress Notes (Signed)
PROGRESS NOTE  MARSDEN ZAINO IWP:[id number] DOB: Aug 11, 1933 DOA: 11/03/2016 PCP: Vena Austria, MD  HPI/Recap of past 24 hours: Patient is an 82 year old male with past mental history of chronic systolic heart failure with EF of 35%, COPD not on any oxygen at home and colon cancer admitted 11/15 for weakness and watery bowel movement 4 days with a recent treatment as outpatient for UTI. Patient was found to have C. difficile colitis as well as a community-acquired pneumonia. He was started on treatment for both and has since completed antibiotics for community-acquired pneumonia. Of course, located by SVT as well as acute systolic heart failure. Cardiology consulted.  No bowel movements. Breathing continues to improve and is now down to 3 lit.  Pt has no other complaints.  Assessment/Plan: Principal Problem:   Sepsis ([place]): Secondary to C. difficile plus pneumonia-aspiration versus community-acquired. Patient met criteria for sepsis on admission given leukocytosis, hypotension, tachycardia and elevated lactic acid level with pulmonary and GI source.  Sepsis is since resolved. Patient is completed antibiotics for pneumonia. He will need to be on 2 weeks total of by mouth vancomycin after completion of other antibiotics, still has 3 days to go. Active Problems:   Acute respiratory failure ([name]) and patient with history of COPD Gold complicated factors of pneumonia and heart failure. Down to 4 L nasal cannula. Encouraged and spirometry use. Continue to diurese. Cardiology formally signed off. Completed antibiotics for pneumonia. Continue inhalers.    Community acquired pneumonia of right lung versus aspiration pneumonia with secondary dysphagia: Continue oral vancomycin and florastor.    Diarrhea secondary to C. difficile colitis: slowly improving. Continued on by mouth vancomycin    AKI (acute kidney injury) ([place])  In the setting of stage II chronic kidney disease: Secondary to  decreased perfusion in the setting of acute systolic heart failure. Has improved with diuresis continues now back to baseline    Hyperkalemia: Resolved      Acute systolic CHF (congestive heart failure) ([name])/cardiomyopathy/elevated troponin: Noted left ventricular dysfunction with EF of 30-35 percent. Cardiology following. Recommendation is for follow-up stress test as an outpatient as breathing is better stabilized. Patient down 13 pounds from admission. Continue ACE inhibitor and beta blocker. Was on by mouth Lasix, have increased to IV 20 twice a day. At this time, no evidence of acute ischemia and this may be more demand ischemia versus troponin elevated in the setting of pulmonary issues and CHF.    PSVT (paroxysmal supraventricular tachycardia) [facility]): Seen by cardiologist. Continue aspirin. No evidence of hyperthyroidism. Lexus scan Myoview as outpatient if tolerance from COPD standpoint.    Sleep apnea: Does not tolerate CPAP   Code Status: DO NOT RESUSCITATE   Family Communication: Wife  at the bedside   Disposition Plan:  Continue to diurese. Continue to titrate down oxygen. Anticipate discharge in tomorrow, on 2 L nasal cannula    Consultants:  Cardiology   Procedures:   2-D echo done 11/21: Moderate to severe reduction of systolic function with EF of 30-35 percent. Not technically sufficient to allow evaluation of left ventricular diastolic dysfunction. Calcified mitral valve.  Antimicrobials:  Zithromax 11/15-11/21  Flagyl 11/16-11/23  Oral vancomycin 11/16-present  IV Rocephin 11/16-11/22   DVT prophylaxis:  Lovenox   Objective: Vitals:   11/19/16 0530 11/19/16 1209 11/19/16 1212 11/19/16 1340  BP: (!) 127/58   118/65  Pulse: 61   (!) 54  Resp: 18   18  Temp: 99.1 F (37.3 C)   97.9 F (  36.6 C)  TempSrc: Oral   Oral  SpO2: 93% 91% 91% 91%  Weight: 102.1 kg (225 lb 1.4 oz)     Height:        Intake/Output Summary (Last 24 hours) at 11/19/16  1357 Last data filed at 11/19/16 1341  Gross per 24 hour  Intake              240 ml  Output             1625 ml  Net            -1385 ml   Filed Weights   11/17/16 0511 11/18/16 0500 11/19/16 0530  Weight: 104.3 kg (229 lb 15 oz) 103.4 kg (227 lb 15.3 oz) 102.1 kg (225 lb 1.4 oz)    Exam:   General:  Alert and oriented 3, no acute distress   Cardiovascular: regular rate and rhythm, S1-S2   Respiratory: decreased breath sounds throughout  Abdomen: soft, obese, nontender, positive bowel sounds   Musculoskeletal: 1+ pitting edema bilaterally   Skin: No skin breaks, tears or lesions  Psychiatry: patient is appropriate, no evidence of psychoses    Data Reviewed: CBC: No results for input(s): WBC, NEUTROABS, HGB, HCT, MCV, PLT in the last 168 hours. Basic Metabolic Panel:  Recent Labs Lab 11/13/16 0526 11/14/16 0529 11/15/16 0528 11/16/16 0520 11/17/16 0539 11/18/16 0543 11/19/16 0507  NA 143 142 141 141 141  --  141  K 4.1 4.5 4.7 4.8 4.6  --  4.3  CL 110 109 107 108 103  --  101  CO2 _0 --  32  GLUCOSE 100* 107* 98 99 109*  --  100*  BUN 22* 23* 23* 26* 28*  --  30*  CREATININE 1.45* 1.36* 1.24 1.19 1.20 1.23 1.27*  CALCIUM 9.9 10.2 10.5* 10.5* 10.8*  --  10.6*  MG 1.4* 1.4* 1.7 1.8  --   --   --    GFR: Estimated Creatinine Clearance: 55.5 mL/min (by C-G formula based on SCr of 1.27 mg/dL (H)). Liver Function Tests: No results for input(s): AST, ALT, ALKPHOS, BILITOT, PROT, ALBUMIN in the last 168 hours. No results for input(s): LIPASE, AMYLASE in the last 168 hours. No results for input(s): AMMONIA in the last 168 hours. Coagulation Profile: No results for input(s): INR, PROTIME in the last 168 hours. Cardiac Enzymes: No results for input(s): CKTOTAL, CKMB, CKMBINDEX, TROPONINI in the last 168 hours. BNP (last 3 results) No results for input(s): PROBNP in the last 8760 hours. HbA1C: No results for input(s): HGBA1C in the last 72  hours. CBG: No results for input(s): GLUCAP in the last 168 hours. Lipid Profile: No results for input(s): CHOL, HDL, LDLCALC, TRIG, CHOLHDL, LDLDIRECT in the last 72 hours. Thyroid Function Tests: No results for input(s): TSH, T4TOTAL, FREET4, T3FREE, THYROIDAB in the last 72 hours. Anemia Panel: No results for input(s): VITAMINB12, FOLATE, FERRITIN, TIBC, IRON, RETICCTPCT in the last 72 hours. Urine analysis:    Component Value Date/Time   COLORURINE YELLOW 11/03/2016 1800   APPEARANCEUR CLEAR 11/03/2016 1800   LABSPEC 1.017 11/03/2016 1800   PHURINE 5.5 11/03/2016 1800   GLUCOSEU NEGATIVE 11/03/2016 1800   HGBUR NEGATIVE 11/03/2016 1800   BILIRUBINUR LARGE (A) 11/03/2016 1800   KETONESUR 15 (A) 11/03/2016 1800   PROTEINUR NEGATIVE 11/03/2016 1800   UROBILINOGEN 1.0 05/08/2013 1738   NITRITE NEGATIVE 11/03/2016 1800   LEUKOCYTESUR NEGATIVE 11/03/2016 1800   Sepsis Labs: _1 (procalcitonin:4,lacticidven:4)  )  No results found for this or any previous visit (from the past 240 hour(s)).    Studies: No results found.  Scheduled Meds: . aspirin EC  81 mg Oral q morning - 10a  . bisoprolol  10 mg Oral Daily  . budesonide (PULMICORT) nebulizer solution  0.25 mg Nebulization BID  . enoxaparin (LOVENOX) injection  40 mg Subcutaneous Daily  . ferrous sulfate  325 mg Oral Q breakfast  . furosemide  20 mg Intravenous BID  . guaiFENesin  600 mg Oral BID  . lisinopril  40 mg Oral Daily  . magnesium oxide  400 mg Oral BID  . mouth rinse  15 mL Mouth Rinse BID  . multivitamin with minerals  1 tablet Oral Daily  . saccharomyces boulardii  250 mg Oral BID  . sertraline  50 mg Oral Daily  . thiamine  100 mg Oral Daily  . tiotropium  18 mcg Inhalation Daily  . vancomycin  500 mg Oral Q6H  . vitamin B-12  1,000 mcg Oral Daily    Continuous Infusions:   LOS: 16 days     Annita Brod, MD [facility] Pager [phone]  If 7PM-7AM, please contact  night-coverage [email or web address] Password [email or web address] 11/19/2016, 1:57 PM

## 2016-11-19 NOTE — Care Management Important Message (Signed)
Important Message  Patient Details IM Letter given to Kathy/Case Manager to present to Patient Name: KELDAN EPLIN MRN: [id number] Date of Birth: 01-12-1933   Medicare Important Message Given:  Yes    Camillo Flaming 11/19/2016, 11:39 AMImportant Message  Patient Details  Name: ARDIT DANH MRN: [id number] Date of Birth: [birth year]   Medicare Important Message Given:  Yes    Camillo Flaming 11/19/2016, 11:39 AM

## 2016-11-20 LAB — BASIC METABOLIC PANEL
Anion gap: 9 (ref 5–15)
BUN: 29 mg/dL — AB (ref 6–20)
CHLORIDE: 100 mmol/L — AB (ref 101–111)
CO2: 33 mmol/L — ABNORMAL HIGH (ref 22–32)
CREATININE: 1.31 mg/dL — AB (ref 0.61–1.24)
Calcium: 10.6 mg/dL — ABNORMAL HIGH (ref 8.9–10.3)
GFR calc Af Amer: 58 mL/min — ABNORMAL LOW (ref >60)
GFR calc non Af Amer: 50 mL/min — ABNORMAL LOW (ref >60)
GLUCOSE: 95 mg/dL (ref 65–99)
POTASSIUM: 4.2 mmol/L (ref 3.5–5.1)
Sodium: 142 mmol/L (ref 135–145)

## 2016-11-20 MED ORDER — BISOPROLOL FUMARATE 10 MG PO TABS: 10.0000 mg | ORAL_TABLET | Freq: Every day | ORAL | 2 refills | Status: DC

## 2016-11-20 MED ORDER — SALINE SPRAY 0.65 % NA SOLN
1.0000 | NASAL | Status: DC | PRN
  Administered 2016-11-20: 1 via NASAL
  Filled 2016-11-20: qty 44

## 2016-11-20 MED ORDER — SALINE SPRAY 0.65 % NA SOLN: 1.0000 | NASAL | 0 refills | Status: DC | PRN

## 2016-11-20 MED ORDER — FUROSEMIDE 20 MG PO TABS: 20.0000 mg | ORAL_TABLET | Freq: Every day | ORAL | Status: DC

## 2016-11-20 MED ORDER — LISINOPRIL 40 MG PO TABS: 40.0000 mg | ORAL_TABLET | Freq: Every day | ORAL | 2 refills | Status: DC

## 2016-11-20 MED ORDER — FUROSEMIDE 20 MG PO TABS: 20.0000 mg | ORAL_TABLET | Freq: Every day | ORAL | 2 refills | Status: DC

## 2016-11-20 MED ORDER — VANCOMYCIN 50 MG/ML ORAL SOLUTION: 500.0000 mg | Freq: Four times a day (QID) | ORAL | 0 refills | Status: AC

## 2016-11-20 NOTE — Progress Notes (Signed)
Please see previous NCM notes. Made [facility] aware of dc home today with HH. Jonnie Finner RN CCM Case Mgmt phone [phone]

## 2016-11-20 NOTE — Discharge Instructions (Signed)
Oxygen at 2.5 lit continuously.  At night, 3.5

## 2016-11-20 NOTE — Discharge Summary (Signed)
Discharge Summary  ZAION HREHA CVE:[id number] DOB: [birth year]  PCP: Vena Austria, MD  Admit date: 11/03/2016 Discharge date: 11/20/2016  Time spent: 25 minutes   Recommendations for Outpatient Follow-up:  1. Medication change: Lisinopril/HCTZ 20/12.5 discontinued 2. New medication: Lisinopril 40 mg by mouth daily 3. New medication: Lasix 40 by mouth daily 4. New medication: Oral vancomycin 500 mg solution 4 times a day 2 more days 5. Patient being discharged on oxygen 2.5 L nasal cannula, 3.5 L at night 6. Patient being discharged with full home health services including PT, OT, social work, Radio producer 7.  New medication: Bisoprolol 10 mg by mouth daily' 8. patient will follow-up with his PCP Dr. Alyson Ingles next week  Discharge Diagnoses:  Active Hospital Problems   Diagnosis Date Noted  . Sepsis ([place]) 11/10/2016  . Pulmonary edema   . Hypomagnesemia 11/15/2016  . Sleep apnea   . Elevated troponin 11/10/2016  . Acute systolic CHF (congestive heart failure) ([place]) 11/10/2016  . Cardiomyopathy ([place]) 11/10/2016  . PSVT (paroxysmal supraventricular tachycardia) ([place]) 11/10/2016  . Dysphagia   . Renal insufficiency   . Enteritis due to Clostridium difficile   . Aspiration pneumonia ([place]) 11/04/2016  . Diarrhea 11/04/2016  . AKI (acute kidney injury) ([place]) 11/04/2016  . Hyperkalemia 11/04/2016  . Community acquired pneumonia of right lung 11/03/2016  . Essential hypertension 05/31/2013  . COPD  GOLD II with reversibility 05/29/2013  . Acute respiratory failure ([place]) 05/08/2013    Resolved Hospital Problems   Diagnosis Date Noted Date Resolved  No resolved problems to display.    Discharge Condition: Improved, being discharged home   Diet recommendation: Heart healthy   Vitals:   11/19/16 2128 11/20/16 0555  BP: (!) 127/54 132/62  Pulse: (!) 55 61  Resp: 18 16  Temp: 97.6 F (36.4 C) 98.2 F (36.8 C)    History of present illness:    Patient is an 82 year old male with past mental history of chronic systolic heart failure with EF of 35%, COPD not on any oxygen at home and colon cancer admitted 11/15 for weakness and watery bowel movement 4 days with a recent treatment as outpatient for UTI. Patient was found to have C. difficile colitis as well as a community-acquired pneumonia.  Hospital Course:    Sepsis [facility]): Secondary to C. difficile plus pneumonia-aspiration versus community-acquired. Patient met criteria for sepsis on admission given leukocytosis, hypotension, tachycardia and elevated lactic acid level with pulmonary and GI source.  Sepsis is since resolved. Patient is completed antibiotics for pneumonia. Only a few more days for his C. difficile Active Problems:   Acute respiratory failure ([place]) and patient with history of COPD Gold complicated factors of pneumonia and heart failure. With treatment of pneumonia and aggressive diuresis, patient is down to 2-1/2 L nasal cannula. Encouraged further in spirometer use at home. Patient will follow up with his PCP to find out when he can come off of oxygen altogether, which I suspect he will be able to. Continue inhalers.    Community acquired pneumonia of right lung versus aspiration pneumonia with secondary dysphagia: Completed full course of antibiotics    Diarrhea secondary to C. difficile colitis: Responded well to therapy. Continued on by mouth vancomycin and patient has completed almost his full course. He'll be discharged with a prescription for 2 more days    AKI (acute kidney injury) ([place])  In the setting of stage II chronic kidney disease: Secondary to decreased perfusion in the setting  of acute systolic heart failure. Has improved with diuresis continues now back to baseline    Hyperkalemia: Resolved      Acute systolic CHF (congestive heart failure) ([name])/cardiomyopathy/elevated troponin: Patient's hospitalization was complicated by systolic heart failure.  Echocardiogram done Noted left ventricular dysfunction with EF of 30-35 percent. Cardiology consulted. Recommendation is for follow-up stress test as an outpatient as breathing is better stabilized. He was aggressively diuresed and by 12/1, was felt to be down to his dry weight of 225 pounds. Patient down 15 pounds from admission. Continue ACE inhibitor and beta blocker. Will discharge on by mouth Lasix. At this time, no evidence of acute ischemia and this may be more demand ischemia versus troponin elevated in the setting of pulmonary issues and CHF. He    PSVT (paroxysmal supraventricular tachycardia) [facility]): Seen by cardiologist. Continue aspirin. No evidence of hyperthyroidism. Lexus scan Myoview as outpatient if tolerance from COPD standpoint.    Sleep apnea: Does not tolerate CPAP mask. Feels very claustrophobic. We had discussions about possibility of nasal CPAP. He is amenable to this. Have referred him to Dr. Maxwell Caul for sleep study  Consultants:  Cardiology   Procedures:   2-D echo done 11/21: Moderate to severe reduction of systolic function with EF of 30-35 percent. Not technically sufficient to allow evaluation of left ventricular diastolic dysfunction. Calcified mitral valve.  Discharge Exam: BP 132/62 (BP Location: Right Arm)   Pulse 61   Temp 98.2 F (36.8 C) (Oral)   Resp 16   Ht _0  (1.778 m)   Wt 102.2 kg (225 lb 5 oz)   SpO2 93%   BMI 32.33 kg/m   General: Alert and oriented 3, no acute distress  Cardiovascular: Regular rate and rhythm, S1-S2 Respiratory: clear to auscultation bilaterally, decreased breath sounds throughout   Discharge Instructions You were cared for by a hospitalist during your hospital stay. If you have any questions about your discharge medications or the care you received while you were in the hospital after you are discharged, you can call the unit and asked to speak with the hospitalist on call if the hospitalist that took care of you is  not available. Once you are discharged, your primary care physician will handle any further medical issues. Please note that NO REFILLS for any discharge medications will be authorized once you are discharged, as it is imperative that you return to your primary care physician (or establish a relationship with a primary care physician if you do not have one) for your aftercare needs so that they can reassess your need for medications and monitor your lab values.  Discharge Instructions    Diet - low sodium heart healthy    Complete by:  As directed    Increase activity slowly    Complete by:  As directed        Medication List    STOP taking these medications   lisinopril-hydrochlorothiazide 20-12.5 MG tablet Commonly known as:  PRINZIDE,ZESTORETIC     TAKE these medications   aspirin EC 81 MG tablet Take 81 mg by mouth every morning.   bisoprolol 10 MG tablet Commonly known as:  ZEBETA Take 1 tablet (10 mg total) by mouth daily. Start taking on:  11/21/2016   ferrous sulfate 325 (65 FE) MG tablet Take 325 mg by mouth daily with breakfast.   furosemide 20 MG tablet Commonly known as:  LASIX Take 1 tablet (20 mg total) by mouth daily. Start taking on:  11/21/2016  lisinopril 40 MG tablet Commonly known as:  PRINIVIL,ZESTRIL Take 1 tablet (40 mg total) by mouth daily. Start taking on:  11/21/2016   multivitamin with minerals Tabs tablet Take 1 tablet by mouth daily.   sertraline 50 MG tablet Commonly known as:  ZOLOFT Take 50 mg by mouth daily.   sodium chloride 0.65 % Soln nasal spray Commonly known as:  OCEAN Place 1 spray into both nostrils as needed for congestion.   tiotropium 18 MCG inhalation capsule Commonly known as:  SPIRIVA Place 1 capsule (18 mcg total) into inhaler and inhale daily.   vancomycin 50 mg/mL oral solution Commonly known as:  VANCOCIN Take 10 mLs (500 mg total) by mouth every 6 (six) hours.            Durable Medical Equipment         Start     Ordered   11/19/16 1357  For home use only DME 3 n 1  Once     11/19/16 1357   11/19/16 1357  For home use only DME Tub bench  Once     11/19/16 1357   11/19/16 1357  For home use only DME Walker rolling  Once    Question:  Patient needs a walker to treat with the following condition  Answer:  CHF (congestive heart failure) ([place])   11/19/16 1357   11/15/16 1024  For home use only DME oxygen  Once    Question Answer Comment  Mode or (Route) Nasal cannula   Liters per Minute 4   Frequency Continuous (stationary and portable oxygen unit needed)   Oxygen delivery system Gas      11/15/16 1024     Allergies  Allergen Reactions  . Flexeril [Cyclobenzaprine] Other (See Comments)    Unknown.    Follow-up [place] Follow up.   Why:  Home oxygen, bedside commode,rolling walker,transfer tub bench Contact information: [place] [address] [phone]        Rosaria Ferries, PA-C Follow up on 12/02/2016.   Specialties:  Cardiology, Radiology Why:  Cardiology Hospital Follow-Up On 12/02/2016 at 10:00AM Contact information: [address] [place] [address] [place] [address] [place] Follow up.   Why:  [place], physical therapy,occupational therapy,aide/social work Sport and exercise psychologist information: [address] [place] [address] [phone]        Vena Austria, MD. Schedule an appointment as soon as possible for a visit in 2 week(s).   Specialty:  Family Medicine Contact information: [place] [address] [phone]        Carlena Sax, MD. Schedule an appointment as soon as possible for a visit.   Specialty:  Internal Medicine Contact information: [address] [place] [address] [phone]            The results of significant diagnostics from this hospitalization (including  imaging, microbiology, ancillary and laboratory) are listed below for reference.    Significant Diagnostic Studies: Dg Chest 2 View  Result Date: 11/12/2016 CLINICAL DATA:  Shortness of breath, fever and cough for 1 week, history hypertension, former smoker, COPD, colon cancer EXAM: CHEST  2 VIEW COMPARISON:  11 20,017 FINDINGS: Upper normal heart size. Mediastinal contours and pulmonary vascularity normal. Atherosclerotic calcification aorta. Small bibasilar pleural effusions. Underlying emphysematous changes. No definite acute infiltrate or pneumothorax. Bones demineralized. IMPRESSION:  COPD changes with bibasilar small pleural effusions. No definite acute infiltrate. Electronically Signed   By: Lavonia Dana M.D.   On: 11/12/2016 09:07   Dg Chest 2 View  Result Date: 11/08/2016 CLINICAL DATA:  82 y/o M; worsening shortness of breath since this morning. Elevated troponin. EXAM: CHEST  2 VIEW COMPARISON:  11/08/2016 chest radiograph. FINDINGS: Increasing distortion markings and linear performed lines compatible interstitial pulmonary edema. Small bilateral pleural effusions. Stable cardiac silhouette given projection and technique. Aortic atherosclerosis with arch calcification. Degenerative changes of the thoracic spine. IMPRESSION: Interstitial pulmonary edema and small bilateral effusions grossly unchanged from prior radiographs. Electronically Signed   By: Kristine Garbe M.D.   On: 11/08/2016 22:15   Dg Chest 2 View  Result Date: 11/08/2016 CLINICAL DATA:  Low O2 sats, shortness of breath. EXAM: CHEST  2 VIEW COMPARISON:  11/08/2016 and CT chest 11/03/2016. FINDINGS: Trachea is midline. Heart size stable. Thoracic aorta is calcified. Mid and lower lung zone interstitial prominence and peripheral septal lines. Small bilateral pleural effusions. IMPRESSION: Findings suspicious for mild edema and small bilateral pleural effusions. Electronically Signed   By: Lorin Picket M.D.   On:  11/08/2016 09:09   Dg Chest 2 View  Result Date: 11/03/2016 CLINICAL DATA:  Shortness of breath, diarrhea, fatigue. History of COPD, colon cancer. EXAM: CHEST  2 VIEW COMPARISON:  Chest radiograph July 06, 2013 FINDINGS: Cardiomediastinal silhouette is upper limits of normal in size. Mildly calcified aorta. No pleural effusion or focal consolidation. No pneumothorax. Strandy densities LEFT lung base. Small nodular density projecting RIGHT lung base. Old RIGHT anterior rib fractures. Severe RIGHT glenohumeral osteoarthrosis. Moderate degenerative change of thoracic spine. IMPRESSION: Borderline cardiomegaly.  LEFT lung base atelectasis. **An incidental finding of potential clinical significance has been found. Small nodular density RIGHT lung base. Recommend CT chest at 6-12 months.** Electronically Signed   By: Elon Alas M.D.   On: 11/03/2016 20:16   Ct Angio Chest Pe W And/or Wo Contrast  Result Date: 11/03/2016 CLINICAL DATA:  Shortness of breath, fatigue. History of colon cancer, emphysema and COPD. EXAM: CT ANGIOGRAPHY CHEST WITH CONTRAST TECHNIQUE: Multidetector CT imaging of the chest was performed using the standard protocol during bolus administration of intravenous contrast. Multiplanar CT image reconstructions and MIPs were obtained to evaluate the vascular anatomy. CONTRAST:  80 cc Isovue 370 COMPARISON:  Chest radiograph November 03, 2016 at 1957 hours and CT chest May 16, 2013 FINDINGS: CARDIOVASCULAR: Adequate contrast opacification of the pulmonary artery's. Main pulmonary artery is not enlarged. No pulmonary arterial filling defects to the level of the subsegmental branches. Heart size is normal, no right heart strain. Mild coronary artery calcifications. No pericardial effusions. Thoracic aorta is normal course and caliber, mild calcific atherosclerosis. MEDIASTINUM/NODES: No lymphadenopathy by CT size criteria. Small RIGHT hilar lymph nodes without lymphadenopathy by CT size  criteria. LUNGS/PLEURA: Mild bronchial wall thickening. Minimal secretions in the trachea mucous plugging RIGHT lower lobe bronchi. Diffuse mild bronchial wall thickening. Hypo enhancing RIGHT lower lobe consolidation with air bronchograms. Moderate centrilobular emphysema. No pleural effusion. No pulmonary nodule or mass. LEFT lung base atelectasis. UPPER ABDOMEN: Included view of the abdomen is nonacute. Adrenal glands not included. Hepatic steatosis. MUSCULOSKELETAL: Visualized soft tissues and included osseous structures are nonacute. Severe RIGHT glenohumeral osteoarthrosis with undersurface spurring. Old RIGHT antral lateral rib fractures. Review of the MIP images confirms the above findings. IMPRESSION: No acute pulmonary embolism. RIGHT lower lobe consolidation concerning for pneumonia, possibly secondary to aspiration. Diffuse mild  bronchial wall thickening can be seen with reactive airway dose disease or bronchitis. Secretions in the trachea and bronchi consistent with aspiration. Moderate emphysema. No pulmonary nodule, radiographic findings could be artifact or, secondary to RIGHT lung base consolidation. Electronically Signed   By: Elon Alas M.D.   On: 11/03/2016 23:15   Dg Esophagus  Result Date: 11/05/2016 CLINICAL DATA:  Dysphagia. EXAM: ESOPHOGRAM/BARIUM SWALLOW TECHNIQUE: Single contrast examination was performed using  thin barium. FLUOROSCOPY TIME:  Fluoroscopy Time:  1 minutes 30 seconds Radiation Exposure Index (if provided by the fluoroscopic device): Number of Acquired Spot Images: 0 COMPARISON:  None. FINDINGS: Moderate esophageal dysmotility. No stricture or mass. Barium passes readily into the stomach. No hiatal hernia. Stomach grossly normal. The patient swallowed barium while supine for the study. IMPRESSION: Esophageal dysmotility.  Negative for stricture or mass.  No hernia. Electronically Signed   By: Franchot Gallo M.D.   On: 11/05/2016 09:30   Dg Chest Port 1  View  Result Date: 11/17/2016 CLINICAL DATA:  Shortness of breath EXAM: PORTABLE CHEST 1 VIEW COMPARISON:  11/12/2016 FINDINGS: Heart size is normal. Aortic atherosclerosis. The right lung is clear. There is abnormal density at the left lower chest probably related to a combination of volume loss and left effusion. Left base pneumonia not excluded. IMPRESSION: Abnormal density at the left lung base likely related to a combination of effusion and volume loss. Left lower lobe pneumonia not excluded. Electronically Signed   By: Nelson Chimes M.D.   On: 11/17/2016 07:10   Dg Chest Port 1 View  Result Date: 11/08/2016 CLINICAL DATA:  Acute onset of shortness of breath. Initial encounter. EXAM: PORTABLE CHEST 1 VIEW COMPARISON:  Chest radiograph and CTA of the chest performed 11/03/2016 FINDINGS: The lungs are well-aerated. Vascular congestion is noted. Bilateral central and bibasilar airspace opacities raise concern for pulmonary edema, though pneumonia could have a similar appearance. Small bilateral pleural effusions are suspected. No pneumothorax is seen. The cardiomediastinal silhouette is within normal limits. No acute osseous abnormalities are seen. IMPRESSION: Vascular congestion noted. Bilateral central and bibasilar airspace opacities raise concern for pulmonary edema, though pneumonia could have a similar appearance. Small bilateral pleural effusions. Electronically Signed   By: Garald Balding M.D.   On: 11/08/2016 01:10    Microbiology: No results found for this or any previous visit (from the past 240 hour(s)).   Labs: Basic Metabolic Panel:  Recent Labs Lab 11/14/16 0529 11/15/16 0528 11/16/16 0520 11/17/16 0539 11/18/16 0543 11/19/16 0507 11/20/16 0545  NA 142 141 141 141  --  141 142  K 4.5 4.7 4.8 4.6  --  4.3 4.2  CL 109 107 108 103  --  101 100*  CO2 _0 --  32 33*  GLUCOSE 107* 98 99 109*  --  100* 95  BUN 23* 23* 26* 28*  --  30* 29*  CREATININE 1.36* 1.24 1.19  1.20 1.23 1.27* 1.31*  CALCIUM 10.2 10.5* 10.5* 10.8*  --  10.6* 10.6*  MG 1.4* 1.7 1.8  --   --   --   --    Liver Function Tests: No results for input(s): AST, ALT, ALKPHOS, BILITOT, PROT, ALBUMIN in the last 168 hours. No results for input(s): LIPASE, AMYLASE in the last 168 hours. No results for input(s): AMMONIA in the last 168 hours. CBC: No results for input(s): WBC, NEUTROABS, HGB, HCT, MCV, PLT in the last 168 hours. Cardiac Enzymes: No results for input(s): CKTOTAL,  CKMB, CKMBINDEX, TROPONINI in the last 168 hours. BNP: BNP (last 3 results)  Recent Labs  11/10/16 0331  BNP 716.9*    ProBNP (last 3 results) No results for input(s): PROBNP in the last 8760 hours.  CBG: No results for input(s): GLUCAP in the last 168 hours.     Signed:  Annita Brod, MD [facility] 11/20/2016, 1:17 PM

## 2016-12-02 ENCOUNTER — Ambulatory Visit (INDEPENDENT_AMBULATORY_CARE_PROVIDER_SITE_OTHER): Payer: Medicare Other | Admitting: Physician Assistant

## 2016-12-02 ENCOUNTER — Encounter: Payer: Self-pay | Admitting: Physician Assistant

## 2016-12-02 ENCOUNTER — Ambulatory Visit: Payer: Medicare Other | Admitting: Physician Assistant

## 2016-12-02 VITALS — BP 110/62 | HR 62 | Ht 70.5 in | Wt 213.0 lb

## 2016-12-02 DIAGNOSIS — I429 Cardiomyopathy, unspecified: Secondary | ICD-10-CM | POA: Diagnosis not present

## 2016-12-02 DIAGNOSIS — I5022 Chronic systolic (congestive) heart failure: Secondary | ICD-10-CM | POA: Diagnosis not present

## 2016-12-02 NOTE — Progress Notes (Deleted)
Cardiology Office Note   Date:  12/02/2016   ID:  David Barton, DOB 05-15-1933, MRN [id number]  PCP:  Vena Austria, MD  Cardiologist:  Dr Gwenlyn Found, saw in-hospital 11/09/2016  David Barton, Suanne Marker, PA-C   No chief complaint on file.   History of Present Illness: David Barton is a 82 y.o. male with a history of COPD, colon CA, ETOH abuse, remote tobacco, HTN, obesity, DNR  10/2016, cards followed for SVT, acute S-CHF, and elevated troponin during hospitalization for acute hpoxic respiratory failure, sepsis due to aspiration PNA, AKI with Cr 1.73, hyperkalemia 5.4, leukocytosis 14.8, mild macroytic anemia, and C diff colitis. Troponin felt 2nd demand ischemia, outpatient eval requested  David Barton presents for ***   Past Medical History:  Diagnosis Date  . Alcohol abuse   . Colon cancer ([place])   . COPD (chronic obstructive pulmonary disease) ([place])   . Emphysema of lung ([place])   . Former tobacco use   . Hypertension   . Peripheral edema     Past Surgical History:  Procedure Laterality Date  . CATARACT EXTRACTION    . COLON RESECTION    . COLONOSCOPY    . MINOR HEMORRHOIDECTOMY  1995  . TONSILLECTOMY      Current Outpatient Prescriptions  Medication Sig Dispense Refill  . aspirin EC 81 MG tablet Take 81 mg by mouth every morning.    . bisoprolol (ZEBETA) 10 MG tablet Take 1 tablet (10 mg total) by mouth daily. 30 tablet 2  . ferrous sulfate 325 (65 FE) MG tablet Take 325 mg by mouth daily with breakfast.    . furosemide (LASIX) 20 MG tablet Take 1 tablet (20 mg total) by mouth daily. 30 tablet 2  . lisinopril (PRINIVIL,ZESTRIL) 40 MG tablet Take 1 tablet (40 mg total) by mouth daily. 30 tablet 2  . Multiple Vitamin (MULTIVITAMIN WITH MINERALS) TABS tablet Take 1 tablet by mouth daily.    . sertraline (ZOLOFT) 50 MG tablet Take 50 mg by mouth daily.    . sodium chloride (OCEAN) 0.65 % SOLN nasal spray Place 1 spray into both nostrils as needed for  congestion. 1 Bottle 0  . tiotropium (SPIRIVA) 18 MCG inhalation capsule Place 1 capsule (18 mcg total) into inhaler and inhale daily. 30 capsule 12   No current facility-administered medications for this visit.     Allergies:   Flexeril [cyclobenzaprine]    Social History:  The patient  reports that he quit smoking about 34 years ago. His smoking use included Cigarettes. He has a 20.00 pack-year smoking history. He has never used smokeless tobacco. He reports that he drinks about 3.5 oz of alcohol per week . He reports that he does not use drugs.   Family History:  The patient's family history includes Brain cancer in his father; Colon cancer in his mother; Lung cancer in his brother.    ROS:  Please see the history of present illness. All other systems are reviewed and negative.    PHYSICAL EXAM: VS:  There were no vitals taken for this visit. , BMI There is no height or weight on file to calculate BMI. GEN: Well nourished, well developed, male in no acute distress  HEENT: normal for age  Neck: no JVD, no carotid bruit, no masses Cardiac: RRR; no murmur, no rubs, or gallops Respiratory:  clear to auscultation bilaterally, normal work of breathing GI: soft, nontender, nondistended, + BS MS: no deformity or atrophy; no edema; distal pulses  are 2+ in all 4 extremities   Skin: warm and dry, no rash Neuro:  Strength and sensation are intact Psych: euthymic mood, full affect   EKG:  EKG {ACTION; IS/IS DEY:[id number]} ordered today. The ekg ordered today demonstrates ***   ECHO: 11/09/2016 - Left ventricle: The cavity size was normal. Systolic function was   moderately to severely reduced. The estimated ejection fraction   was in the range of 30% to 35%. Regional wall motion   abnormalities cannot be excluded. The study is not technically   sufficient to allow evaluation of LV diastolic function. - Mitral valve: Calcified annulus. Impressions: - Technically difficult; definity  used; EF difficult to quantitate   but likely moderate to severely reduced; suggest MUGA or cardiac   MRI to further assess.   Recent Labs: 11/05/2016: ALT 34 11/09/2016: TSH 5.734 11/10/2016: B Natriuretic Peptide 716.9 11/12/2016: Hemoglobin 10.6; Platelets 223 11/16/2016: Magnesium 1.8 11/20/2016: BUN 29; Creatinine, Ser 1.31; Potassium 4.2; Sodium 142    Lipid Panel    Component Value Date/Time   CHOL 106 11/10/2016 0331   TRIG 85 11/10/2016 0331   HDL 60 11/10/2016 0331   CHOLHDL 1.8 11/10/2016 0331   VLDL 17 11/10/2016 0331   LDLCALC 29 11/10/2016 0331     Wt Readings from Last 3 Encounters:  11/20/16 225 lb 5 oz (102.2 kg)  07/16/13 222 lb (100.7 kg)  05/29/13 212 lb 3.2 oz (96.3 kg)     Other studies Reviewed: Additional studies/ records that were reviewed today include: ***.  ASSESSMENT AND PLAN:  1.  ***   Current medicines are reviewed at length with the patient today.  The patient {ACTIONS; HAS/DOES NOT HAVE:19233} concerns regarding medicines.  The following changes have been made:  {PLAN; NO CHANGE:13088:s}  Labs/ tests ordered today include: *** No orders of the defined types were placed in this encounter.    Disposition:   FU with ***  Signed, Rosaria Ferries, PA-C  12/02/2016 8:11 AM    [place] Phone: [phone]; Fax: [phone]  This note was written with the assistance of speech recognition software. Please excuse any transcriptional errors.

## 2016-12-02 NOTE — Patient Instructions (Signed)
Medication Instructions:  Your physician recommends that you continue on your current medications as directed. Please refer to the Current Medication list given to you today.  Labwork: None   Testing/Procedures: Your physician has requested that you have an echocardiogram. Echocardiography is a painless test that uses sound waves to create images of your heart. It provides your doctor with information about the size and shape of your heart and how well your heart's chambers and valves are working. This procedure takes approximately one hour. There are no restrictions for this procedure.  Follow-Up: Your physician recommends that you schedule a follow-up appointment in: 2-3 MONTHS WITH DR Gwenlyn Found  Any Other Special Instructions Will Be Listed Below (If Applicable).  WILL REQUEST LABS FROM DR REEDS OFFICE  If you need a refill on your cardiac medications before your next appointment, please call your pharmacy.

## 2016-12-02 NOTE — Progress Notes (Signed)
Cardiology Office Note   Date:  12/02/2016   ID:  David Barton, DOB 1933/05/15, MRN [id number]  PCP:  Vena Austria, MD  Cardiologist:  Dr Stacy Gardner, PA-C   Chief Complaint  Patient presents with  . Follow-up    2 weeks     History of Present Illness: David Barton is a 82 y.o. male with a history of COPD, colon CA, COPD, former tobacco abuse, HTN, obesity, DNR   11/16-12/02 Admitted w/ sepsis, C diff colitis, aspiration PNA, acute hypoxic respiratory failure. Cards saw for elevated troponin, possible ischemia, CHF, LVD. Outpt MV, timing tbd depending on resp.status. SVT seen, not on [place] 2nd LVD. D/c weight 225 lbs.   David Barton presents for post-hospital f/u. His caregiver is with him today.  Since dc, his weight has continued to decrease. It was 223 on his home scales at first, now 208 on home scales. Breathing better now. Not on O2 PTA, was on 5 lpm at first, down to 2 lpm now. Breathing is not back to baseline, but better.  He has PT coming to the house and is getting a little stronger. Denies orthopnea, PND, LE edema. Breathing is close to baseline. No ETOH. His wife is on top of the low sodium diet. He is doing very well. No chest pain. Good compliance with medications. He generally feels well, much improved from prior to his hospitalization.   Past Medical History:  Diagnosis Date  . Alcohol abuse   . Colon cancer ([place])   . COPD (chronic obstructive pulmonary disease) ([place])   . Emphysema of lung ([place])   . Former tobacco use   . Hypertension   . Peripheral edema     Past Surgical History:  Procedure Laterality Date  . CATARACT EXTRACTION    . COLON RESECTION    . COLONOSCOPY    . MINOR HEMORRHOIDECTOMY  1995  . TONSILLECTOMY      Current Outpatient Prescriptions  Medication Sig Dispense Refill  . aspirin EC 81 MG tablet Take 81 mg by mouth every morning.    . bisoprolol (ZEBETA) 10 MG tablet Take 1 tablet (10 mg total)  by mouth daily. 30 tablet 2  . ferrous sulfate 325 (65 FE) MG tablet Take 325 mg by mouth daily with breakfast.    . furosemide (LASIX) 20 MG tablet Take 1 tablet (20 mg total) by mouth daily. 30 tablet 2  . lisinopril (PRINIVIL,ZESTRIL) 40 MG tablet Take 1 tablet (40 mg total) by mouth daily. 30 tablet 2  . Multiple Vitamin (MULTIVITAMIN WITH MINERALS) TABS tablet Take 1 tablet by mouth daily.    . sertraline (ZOLOFT) 50 MG tablet Take 50 mg by mouth daily.    . sodium chloride (OCEAN) 0.65 % SOLN nasal spray Place 1 spray into both nostrils as needed for congestion. 1 Bottle 0  . tiotropium (SPIRIVA) 18 MCG inhalation capsule Place 1 capsule (18 mcg total) into inhaler and inhale daily. 30 capsule 12   No current facility-administered medications for this visit.     Allergies:   Flexeril [cyclobenzaprine]    Social History:  The patient  reports that he quit smoking about 34 years ago. His smoking use included Cigarettes. He has a 20.00 pack-year smoking history. He has never used smokeless tobacco. He reports that he drinks about 3.5 oz of alcohol per week . He reports that he does not use drugs.   Family History:  The patient's family history  includes Brain cancer in his father; Colon cancer in his mother; Lung cancer in his brother.    ROS:  Please see the history of present illness. All other systems are reviewed and negative.    PHYSICAL EXAM: VS:  BP 110/62   Pulse 62   Ht 5' 10.5" (1.791 m)   Wt 213 lb (96.6 kg)   BMI 30.13 kg/m  , BMI Body mass index is 30.13 kg/m. GEN: Well nourished, well developed, male in no acute distress  HEENT: normal for age  Neck: Minimal JVD, no carotid bruit, no masses Cardiac: RRR; no murmur, no rubs, or gallops Respiratory:  Decreased breath sounds bases bilaterally, normal work of breathing GI: soft, nontender, nondistended, + BS MS: no deformity or atrophy; no edema; distal pulses are 2+ in all 4 extremities   Skin: warm and dry, no  rash Neuro:  Strength and sensation are intact Psych: euthymic mood, full affect   EKG:  EKG is not ordered today.  ECHO: 11/09/2016 - Left ventricle: The cavity size was normal. Systolic function was moderately to severely reduced. The estimated ejection fraction was in the range of 30% to 35%. Regional wall motion abnormalities cannot be excluded. The study is not technically sufficient to allow evaluation of LV diastolic function. - Mitral valve: Calcified annulus. Impressions: - Technically difficult; definity used; EF difficult to quantitate but likely moderate to severely reduced; suggest MUGA or cardiac MRI to further assess.  Recent Labs: 11/05/2016: ALT 34 11/09/2016: TSH 5.734 11/10/2016: B Natriuretic Peptide 716.9 11/12/2016: Hemoglobin 10.6; Platelets 223 11/16/2016: Magnesium 1.8 11/20/2016: BUN 29; Creatinine, Ser 1.31; Potassium 4.2; Sodium 142    Lipid Panel    Component Value Date/Time   CHOL 106 11/10/2016 0331   TRIG 85 11/10/2016 0331   HDL 60 11/10/2016 0331   CHOLHDL 1.8 11/10/2016 0331   VLDL 17 11/10/2016 0331   LDLCALC 29 11/10/2016 0331     Wt Readings from Last 3 Encounters:  12/02/16 213 lb (96.6 kg)  11/20/16 225 lb 5 oz (102.2 kg)  07/16/13 222 lb (100.7 kg)     Other studies Reviewed: Additional studies/ records that were reviewed today include: Office notes, hospital records and testing.  ASSESSMENT AND PLAN:  1.  Chronic systolic CHF: He is doing very well after discharge. His weight is trending down, he has good compliance with dietary restrictions as well as his medications. Continue Lasix at the current dose. He had a BMET yesterday at his primary care physician's office and we will leave management of this to Dr. read, but would like a copy for our records. Patient is agreeable to obtaining this.  He is on good medical therapy with Lasix, beta blocker and ACE inhibitor doses. As he is doing so well right now, will  not consider additional therapies. I'm not sure his blood pressure would tolerate the addition of hydralazine or nitrates. If he begins to have increased difficulties with CHF, consider switching him to an ARB so that he may be put on Entresto.  2. Cardiomyopathy: It is unclear if his cardiomyopathy is ischemic or nonischemic. At this time, he still does not feel that his respiratory status is back to baseline. He prefers not to do any additional testing at this time. He understands the need to figure out if this is ischemic or nonischemic cardiomyopathy.  Follow-up with Dr. Gwenlyn Found and recheck an echocardiogram. He understands that if his EF is not improved, additional consideration will need to be given to  an ischemic evaluation, possibly a defibrillator.   Current medicines are reviewed at length with the patient today.  The patient does not have concerns regarding medicines.  The following changes have been made:  no change  Labs/ tests ordered today include:   Orders Placed This Encounter  Procedures  . ECHOCARDIOGRAM COMPLETE     Disposition:   FU with Dr. Gwenlyn Found  Signed, Rosaria Ferries, PA-C  12/02/2016 2:04 PM    [place] Phone: [phone]; Fax: [phone]  This note was written with the assistance of speech recognition software. Please excuse any transcriptional errors.

## 2016-12-03 ENCOUNTER — Telehealth: Payer: Self-pay | Admitting: Cardiovascular Disease

## 2016-12-03 NOTE — Telephone Encounter (Signed)
Spoke to [employer].  Notes VS - 104/58 BP, HR of 50-52 when checked. Noted the HR was in upper 40s when seen by PT.  Patient is not symptomatic, she notes all other assessment fine, including pulse ox of 96% . Wanted just to report findings to physician. Note patient seen yesterday in office. Please advise if any recommendations such as decreasing bb dose. Changes to medications, etc can be communicated directly to patient.

## 2016-12-03 NOTE — Telephone Encounter (Signed)
Home health Rn concerned about pt's low pulse.  HR 40's earlier today by physical therapist   RN  104/58  PL 50-52  LOW       Not dizziness or drowsy

## 2016-12-06 ENCOUNTER — Ambulatory Visit: Payer: Medicare Other | Admitting: Physician Assistant

## 2016-12-06 NOTE — Telephone Encounter (Signed)
Patient called with advice. Voiced understanding. No concerns at this time.

## 2016-12-06 NOTE — Telephone Encounter (Signed)
Pt of Dr Gwenlyn Found Please let pt know that his HR runs low at times. If he has no sx and his HR is >/= 40, no med changes. If he is getting dizzy or light-headed, call. Thanks

## 2016-12-10 ENCOUNTER — Telehealth: Payer: Self-pay | Admitting: Cardiovascular Disease

## 2016-12-10 NOTE — Telephone Encounter (Signed)
That is fine 

## 2016-12-10 NOTE — Telephone Encounter (Signed)
New Message   Dr. Joneen Caraway cut back heart medication bisoprolol from '10mg'$  to '5mg'$ . Wants to make sure we were aware, and if it's alright.

## 2016-12-10 NOTE — Telephone Encounter (Signed)
LMTCB

## 2016-12-14 NOTE — Telephone Encounter (Signed)
Spoke w wife at patient's request. She notifies me of recent changes to meds by PCP due to hypotension.  Restates that the patient's bisoprolol was cut from '10mg'$  to '5mg'$  daily, has been holding furosemide as well.  Additionally, lisinopril was cut - from '40mg'$  to '20mg'$  for several days. Since then he's been rechecked in office and had systolic reading of 98, similar readings at home. Was instructed by PCP office to discontinue the lisinopril starting tomorrow.  Wife notes this is being followed closely by PCP but wanted to make sure Dr. Gwenlyn Found was OK w changes since they were cutting the bisoprolol. Informed her that this was acknowledged by Dr Gwenlyn Found (see prior note) and that the other changes should be fine. I rec'd continued med management by PCP but that I will route to Dr. Gwenlyn Found to see if anything further advised.

## 2016-12-14 NOTE — Telephone Encounter (Signed)
Acknowledged - thank you!

## 2016-12-14 NOTE — Telephone Encounter (Signed)
That is fine with me.

## 2016-12-14 NOTE — Telephone Encounter (Signed)
Pt returning call from Friday.

## 2017-02-03 ENCOUNTER — Ambulatory Visit (HOSPITAL_COMMUNITY): Payer: Medicare Other | Attending: Cardiovascular Disease

## 2017-02-03 ENCOUNTER — Other Ambulatory Visit: Payer: Self-pay

## 2017-02-03 DIAGNOSIS — I351 Nonrheumatic aortic (valve) insufficiency: Secondary | ICD-10-CM | POA: Insufficient documentation

## 2017-02-03 DIAGNOSIS — I429 Cardiomyopathy, unspecified: Secondary | ICD-10-CM

## 2017-02-03 DIAGNOSIS — I501 Left ventricular failure: Secondary | ICD-10-CM | POA: Insufficient documentation

## 2017-02-22 ENCOUNTER — Encounter: Payer: Self-pay | Admitting: Cardiovascular Disease

## 2017-02-22 ENCOUNTER — Ambulatory Visit (INDEPENDENT_AMBULATORY_CARE_PROVIDER_SITE_OTHER): Payer: Medicare Other | Admitting: Cardiovascular Disease

## 2017-02-22 DIAGNOSIS — I5021 Acute systolic (congestive) heart failure: Secondary | ICD-10-CM

## 2017-02-22 NOTE — Progress Notes (Signed)
02/22/2017 David Barton   [birth year]  [id number]  Primary Physician Vena Austria, MD Primary Cardiologist: Lorretta Harp MD Renae Gloss  HPI:  David Barton is an 83 year old mild to moderately overweight married Caucasian male father of 2, grandfather one grandchild who I initially saw during a hospitalization in November during episode of sepsis. He has a history of remote tobacco abuse. He underwent [employer] which he retired from an age 34. He has lost 50 pounds intentionally and feels much better. There is a history of colon cancer. When I saw him in November he was staring episode of sepsis and mildly elevated troponins. Also had some PSVT. His EF at that time was 30-35% which is ultimately improved this 55-60% by 2-D echo done recently. He is completely asymptomatic.   Current Outpatient Prescriptions  Medication Sig Dispense Refill  . aspirin EC 81 MG tablet Take 81 mg by mouth every morning.    . bisoprolol (ZEBETA) 10 MG tablet Take 1 tablet (10 mg total) by mouth daily. (Patient taking differently: Take by mouth daily. Pt takes '5mg'$ ) 30 tablet 2  . ferrous sulfate 325 (65 FE) MG tablet Take 325 mg by mouth daily with breakfast.    . Multiple Vitamin (MULTIVITAMIN WITH MINERALS) TABS tablet Take 1 tablet by mouth daily.    Marland Kitchen tiotropium (SPIRIVA) 18 MCG inhalation capsule Place 1 capsule (18 mcg total) into inhaler and inhale daily. (Patient taking differently: Place 18 mcg into inhaler and inhale as directed. ) 30 capsule 12   No current facility-administered medications for this visit.     Allergies  Allergen Reactions  . Flexeril [Cyclobenzaprine] Other (See Comments)    Unknown.     Social History   Social History  . Marital status: Married    Spouse name: N/A  . Number of children: 3  . Years of education: N/A   Occupational History  . Retired- Associate Professor    Social History Main Topics  . Smoking status: Former Smoker    Packs/day: 1.00    Years: 20.00    Types: Cigarettes    Quit date: 12/20/1981  . Smokeless tobacco: Never Used  . Alcohol use 3.5 oz/week    7 Standard drinks or equivalent per week     Comment: states he drinks 3-4 days a week, "a couple" on those occasions but does not further quaniity.  . Drug use: No  . Sexual activity: No   Other Topics Concern  . Not on file   Social History Narrative  . No narrative on file     Review of Systems: General: negative for chills, fever, night sweats or weight changes.  Cardiovascular: negative for chest pain, dyspnea on exertion, edema, orthopnea, palpitations, paroxysmal nocturnal dyspnea or shortness of breath Dermatological: negative for rash Respiratory: negative for cough or wheezing Urologic: negative for hematuria Abdominal: negative for nausea, vomiting, diarrhea, bright red blood per rectum, melena, or hematemesis Neurologic: negative for visual changes, syncope, or dizziness All other systems reviewed and are otherwise negative except as noted above.    Blood pressure 128/68, pulse (!) 58, height 5' 10.5" (1.791 m), weight 197 lb 3.2 oz (89.4 kg), SpO2 97 %.  General appearance: alert and no distress Neck: no adenopathy, no carotid bruit, no JVD, supple, symmetrical, trachea midline and thyroid not enlarged, symmetric, no tenderness/mass/nodules Lungs: clear to auscultation bilaterally Heart: regular rate and rhythm, S1, S2 normal, no murmur, click, rub or gallop Extremities:  extremities normal, atraumatic, no cyanosis or edema  EKG not performed today  ASSESSMENT AND PLAN:   Acute systolic CHF (congestive heart failure) [facility]) Mr. Haisley was seen in the hospital in November during episode of sepsis. His troponins were mildly elevated. His EF was 30-35% by 2-D echocardiography. Recent 2-D echo performed 02/03/17 showed normalization with an EF of 55-60%. He is totally asymptomatic.Lorretta Harp MD FACP,FACC,FAHA,  [facility] 02/22/2017 11:20 AM

## 2017-02-22 NOTE — Patient Instructions (Signed)
Medication Instructions: Your physician recommends that you continue on your current medications as directed. Please refer to the Current Medication list given to you today.   Follow-Up: Your physician recommends that you schedule a follow-up appointment as needed with Dr. Berry.  If you need a refill on your cardiac medications before your next appointment, please call your pharmacy.  

## 2017-02-22 NOTE — Assessment & Plan Note (Signed)
David Barton was seen in the hospital in November during episode of sepsis. His troponins were mildly elevated. His EF was 30-35% by 2-D echocardiography. Recent 2-D echo performed 02/03/17 showed normalization with an EF of 55-60%. He is totally asymptomatic.Marland Kitchen

## 2018-09-13 ENCOUNTER — Other Ambulatory Visit: Payer: Self-pay

## 2018-09-13 DIAGNOSIS — R6889 Other general symptoms and signs: Secondary | ICD-10-CM

## 2018-10-31 ENCOUNTER — Encounter (HOSPITAL_COMMUNITY): Payer: Medicare Other

## 2018-10-31 ENCOUNTER — Encounter: Payer: Medicare Other | Admitting: Vascular Surgery

## 2018-12-11 ENCOUNTER — Telehealth: Payer: Self-pay | Admitting: Cardiovascular Disease

## 2018-12-11 MED ORDER — FUROSEMIDE 20 MG PO TABS: 20.0000 mg | ORAL_TABLET | Freq: Every day | ORAL | 3 refills | Status: DC

## 2018-12-11 NOTE — Telephone Encounter (Signed)
  Pt c/o swelling: STAT is pt has developed SOB within 24 hours  1) How much weight have you gained and in what time span? Not sure maybe 20 lbs  2) If swelling, where is the swelling located? Legs, ankles and feet  3) Are you currently taking a fluid pill? no  4) Are you currently SOB? Some occasionally  5) Do you have a log of your daily weights (if so, list)? no  6) Have you gained 3 pounds in a day or 5 pounds in a week? Not sure  7) Have you traveled recently? No  Patient has appt with Dr Gwenlyn Found on 12/21/18 but is wondering if he can get a pill for the swelling because it is getting so bad.

## 2018-12-11 NOTE — Telephone Encounter (Signed)
Spoke with pt, he was on furosemide when he was discharged from the [facility] [place] 2017 and does not know when or why it was stopped. He has swelling in both feet and legs and has SOB when walking to the bathroom or outside with the dog. He denies orthopnea. He does not eat salt and fluid restrictions discussed, also the patient was encouraged to elevated feet when sitting. He has a follow up 12-21-18. Script for furosemide 20 mg once daily called to the pharmacy, he will need bmp at follow up appointment.

## 2018-12-21 ENCOUNTER — Other Ambulatory Visit: Payer: Self-pay | Admitting: Cardiovascular Disease

## 2018-12-21 ENCOUNTER — Encounter: Payer: Self-pay | Admitting: Cardiovascular Disease

## 2018-12-21 ENCOUNTER — Ambulatory Visit: Payer: Medicare Other | Admitting: Cardiovascular Disease

## 2018-12-21 VITALS — BP 142/78 | HR 81 | Ht 70.5 in | Wt 240.0 lb

## 2018-12-21 DIAGNOSIS — R0602 Shortness of breath: Secondary | ICD-10-CM | POA: Diagnosis not present

## 2018-12-21 DIAGNOSIS — I10 Essential (primary) hypertension: Secondary | ICD-10-CM | POA: Diagnosis not present

## 2018-12-21 DIAGNOSIS — I5021 Acute systolic (congestive) heart failure: Secondary | ICD-10-CM | POA: Diagnosis not present

## 2018-12-21 LAB — COMPREHENSIVE METABOLIC PANEL
ALT: 6 IU/L (ref 0–44)
AST: 15 IU/L (ref 0–40)
Albumin/Globulin Ratio: 1.2 (ref 1.2–2.2)
Albumin: 3.7 g/dL (ref 3.5–4.7)
Alkaline Phosphatase: 111 IU/L (ref 39–117)
BUN/Creatinine Ratio: 18 (ref 10–24)
BUN: 35 mg/dL — AB (ref 8–27)
Bilirubin Total: 0.5 mg/dL (ref 0.0–1.2)
CALCIUM: 9.8 mg/dL (ref 8.6–10.2)
CO2: 28 mmol/L (ref 20–29)
CREATININE: 1.95 mg/dL — AB (ref 0.76–1.27)
Chloride: 94 mmol/L — ABNORMAL LOW (ref 96–106)
GFR calc Af Amer: 36 mL/min/{1.73_m2} — ABNORMAL LOW (ref >59)
GFR calc non Af Amer: 31 mL/min/{1.73_m2} — ABNORMAL LOW (ref >59)
GLOBULIN, TOTAL: 3.1 g/dL (ref 1.5–4.5)
GLUCOSE: 96 mg/dL (ref 65–99)
Potassium: 4.6 mmol/L (ref 3.5–5.2)
SODIUM: 139 mmol/L (ref 134–144)
Total Protein: 6.8 g/dL (ref 6.0–8.5)

## 2018-12-21 LAB — TSH+FREE T4
Free T4: 1.14 ng/dL (ref 0.82–1.77)
TSH: 2.99 u[IU]/mL (ref 0.450–4.500)

## 2018-12-21 LAB — BASIC METABOLIC PANEL
BUN / CREAT RATIO: 20 (ref 10–24)
BUN: 38 mg/dL — AB (ref 8–27)
CALCIUM: 9.7 mg/dL (ref 8.6–10.2)
CO2: 27 mmol/L (ref 20–29)
CREATININE: 1.93 mg/dL — AB (ref 0.76–1.27)
Chloride: 94 mmol/L — ABNORMAL LOW (ref 96–106)
GFR calc Af Amer: 36 mL/min/{1.73_m2} — ABNORMAL LOW (ref >59)
GFR calc non Af Amer: 31 mL/min/{1.73_m2} — ABNORMAL LOW (ref >59)
Glucose: 95 mg/dL (ref 65–99)
Potassium: 4.8 mmol/L (ref 3.5–5.2)
Sodium: 139 mmol/L (ref 134–144)

## 2018-12-21 LAB — CBC
HEMOGLOBIN: 14.1 g/dL (ref 13.0–17.7)
Hematocrit: 41.6 % (ref 37.5–51.0)
MCH: 34.1 pg — AB (ref 26.6–33.0)
MCHC: 33.9 g/dL (ref 31.5–35.7)
MCV: 101 fL — AB (ref 79–97)
Platelets: 276 10*3/uL (ref 150–450)
RBC: 4.13 x10E6/uL — AB (ref 4.14–5.80)
RDW: 12.5 % (ref 12.3–15.4)
WBC: 7.9 10*3/uL (ref 3.4–10.8)

## 2018-12-21 NOTE — Progress Notes (Signed)
12/21/2018 David Barton   1933/03/25  [id number]  Primary Physician Maury Dus, MD Primary Cardiologist: Lorretta Harp MD FACP, [place], [place], [place]  HPI:  TRUMAINE WIMER is a 85 y.o.  mild to moderately overweight married Caucasian male father of 2, grandfather one grandchild who I initially saw during a hospitalization in November during episode of sepsis.  I last saw him in the office 02/22/2017.  He has a history of remote tobacco abuse. He underwent [employer] which he retired from an age 69. He has lost 50 pounds intentionally and feels much better. There is a history of colon cancer. When I saw him in November he was staring episode of sepsis and mildly elevated troponins. Also had some PSVT. His EF at that time was 30-35% which is ultimately improved this 55-60% by 2-D echo done recently. He is completely asymptomatic. Since I saw him March 2018 he has gained 40 pounds.  He does admit to dietary indiscretion.  He has had increasing dyspnea on exertion over the last several weeks which improved with the ministration of low-dose oral diuretics.  Edema improved as well.  He says that he watches his salt.  He denies chest pain.   Current Meds  Medication Sig  . aspirin EC 81 MG tablet Take 81 mg by mouth every morning.  . bisoprolol (ZEBETA) 5 MG tablet Take 5 mg by mouth daily.  . ferrous sulfate 325 (65 FE) MG tablet Take 325 mg by mouth daily with breakfast.  . furosemide (LASIX) 20 MG tablet Take 1 tablet (20 mg total) by mouth daily.  . Multiple Vitamin (MULTIVITAMIN WITH MINERALS) TABS tablet Take 1 tablet by mouth daily.  Marland Kitchen tiotropium (SPIRIVA) 18 MCG inhalation capsule Place 1 capsule (18 mcg total) into inhaler and inhale daily. (Patient taking differently: Place 18 mcg into inhaler and inhale as directed. )     Allergies  Allergen Reactions  . Flexeril [Cyclobenzaprine] Other (See Comments)    Unknown.     Social History   Socioeconomic History  . Marital status:  Married    Spouse name: Not on file  . Number of children: 3  . Years of education: Not on file  . Highest education level: Not on file  Occupational History  . Occupation: Retired- Associate Professor  Social Needs  . Financial resource strain: Not on file  . Food insecurity:    Worry: Not on file    Inability: Not on file  . Transportation needs:    Medical: Not on file    Non-medical: Not on file  Tobacco Use  . Smoking status: Former Smoker    Packs/day: 1.00    Years: 20.00    Pack years: 20.00    Types: Cigarettes    Last attempt to quit: 12/20/1981    Years since quitting: 37.0  . Smokeless tobacco: Never Used  Substance and Sexual Activity  . Alcohol use: Yes    Alcohol/week: 7.0 standard drinks    Types: 7 Standard drinks or equivalent per week    Comment: states he drinks 3-4 days a week, "a couple" on those occasions but does not further quaniity.  . Drug use: No  . Sexual activity: Never  Lifestyle  . Physical activity:    Days per week: Not on file    Minutes per session: Not on file  . Stress: Not on file  Relationships  . Social connections:    Talks on phone: Not  on file    Gets together: Not on file    Attends religious service: Not on file    Active member of club or organization: Not on file    Attends meetings of clubs or organizations: Not on file    Relationship status: Not on file  . Intimate partner violence:    Fear of current or ex partner: Not on file    Emotionally abused: Not on file    Physically abused: Not on file    Forced sexual activity: Not on file  Other Topics Concern  . Not on file  Social History Narrative  . Not on file     Review of Systems: General: negative for chills, fever, night sweats or weight changes.  Cardiovascular: negative for chest pain, dyspnea on exertion, edema, orthopnea, palpitations, paroxysmal nocturnal dyspnea or shortness of breath Dermatological: negative for rash Respiratory: negative for  cough or wheezing Urologic: negative for hematuria Abdominal: negative for nausea, vomiting, diarrhea, bright red blood per rectum, melena, or hematemesis Neurologic: negative for visual changes, syncope, or dizziness All other systems reviewed and are otherwise negative except as noted above.    Blood pressure (!) 142/78, pulse 81, height 5' 10.5" (1.791 m), weight 240 lb (108.9 kg).  General appearance: alert and no distress Neck: no adenopathy, no carotid bruit, no JVD, supple, symmetrical, trachea midline and thyroid not enlarged, symmetric, no tenderness/mass/nodules Lungs: clear to auscultation bilaterally Heart: regular rate and rhythm, S1, S2 normal, no murmur, click, rub or gallop Extremities: No edema but changes of venous stasis Pulses: 2+ and symmetric Skin: Erythema bilaterally with changes of venous stasis Neurologic: Alert and oriented X 3, normal strength and tone. Normal symmetric reflexes. Normal coordination and gait  EKG sinus rhythm at 81 with right bundle branch block.  I personally reviewed this EKG.  ASSESSMENT AND PLAN:   Essential hypertension History of essential hypertension with blood pressure measured today 142/78.  He is on Zebeta.  Acute systolic CHF (congestive heart failure) ([name]) History of systolic heart failure in the past the setting of sepsis improvement of his LV function by 2D echo 02/03/2017 to normal from what was originally an EF of 30 to 35%.  He says over last several weeks he has had increasing shortness of breath and lower extremity edema.  Does admit to dietary indiscretion.  He was begun on a diuretic which has improved his symptoms and his edema.  I am going to get a BMP and routine labs as well as a 2D echo to further evaluate.      Lorretta Harp MD FACP,FACC,FAHA, [facility] 12/21/2018 9:56 AM

## 2018-12-21 NOTE — Addendum Note (Signed)
Addended by: Annita Brod on: 12/21/2018 10:45 AM   Modules accepted: Orders

## 2018-12-21 NOTE — Addendum Note (Signed)
Addended by: Annita Brod on: 12/21/2018 10:06 AM   Modules accepted: Orders

## 2018-12-21 NOTE — Assessment & Plan Note (Signed)
History of systolic heart failure in the past the setting of sepsis improvement of his LV function by 2D echo 02/03/2017 to normal from what was originally an EF of 30 to 35%.  He says over last several weeks he has had increasing shortness of breath and lower extremity edema.  Does admit to dietary indiscretion.  He was begun on a diuretic which has improved his symptoms and his edema.  I am going to get a BMP and routine labs as well as a 2D echo to further evaluate.

## 2018-12-21 NOTE — Addendum Note (Signed)
Addended by: Annita Brod on: 12/21/2018 10:38 AM   Modules accepted: Orders

## 2018-12-21 NOTE — Patient Instructions (Signed)
Medication Instructions:  Your physician recommends that you continue on your current medications as directed. Please refer to the Current Medication list given to you today.  If you need a refill on your cardiac medications before your next appointment, please call your pharmacy.   Lab work: Your physician recommends that you have lab work TODAY  If you have labs (blood work) drawn today and your tests are completely normal, you will receive your results only by: Marland Kitchen MyChart Message (if you have MyChart) OR . A paper copy in the mail If you have any lab test that is abnormal or we need to change your treatment, we will call you to review the results.  Testing/Procedures: Your physician has requested that you have an echocardiogram. Echocardiography is a painless test that uses sound waves to create images of your heart. It provides your doctor with information about the size and shape of your heart and how well your heart's chambers and valves are working. This procedure takes approximately one hour. There are no restrictions for this procedure.   Follow-Up: At [facility], you and your health needs are our priority.  As part of our continuing mission to provide you with exceptional heart care, we have created designated Provider Care Teams.  These Care Teams include your primary Cardiologist (physician) and Advanced Practice Providers (APPs -  Physician Assistants and Nurse Practitioners) who all work together to provide you with the care you need, when you need it. You will need a follow up appointment in 3-4 weeks with Dr. Gwenlyn Barton.

## 2018-12-21 NOTE — Assessment & Plan Note (Signed)
History of essential hypertension with blood pressure measured today 142/78.  He is on Zebeta.

## 2018-12-22 ENCOUNTER — Telehealth: Payer: Self-pay | Admitting: *Deleted

## 2018-12-22 NOTE — Telephone Encounter (Addendum)
pt aware of results, he has a follow up 01-17-19.  ----- Message from Lorretta Harp, MD sent at 12/21/2018  4:52 PM EST ----- Renal function worse compared to 2 years ago probably related to increased diuretic.  We will continue to follow this.  Repeat in 2 months.

## 2018-12-22 NOTE — Addendum Note (Signed)
Addended by: Venetia Maxon on: 12/22/2018 05:17 PM   Modules accepted: Orders

## 2018-12-27 ENCOUNTER — Ambulatory Visit (HOSPITAL_COMMUNITY): Payer: Medicare Other | Attending: Cardiology

## 2018-12-27 ENCOUNTER — Other Ambulatory Visit: Payer: Self-pay

## 2018-12-27 DIAGNOSIS — R0602 Shortness of breath: Secondary | ICD-10-CM | POA: Diagnosis present

## 2019-01-02 ENCOUNTER — Ambulatory Visit (HOSPITAL_COMMUNITY)
Admission: RE | Admit: 2019-01-02 | Discharge: 2019-01-02 | Disposition: A | Discharge status: Home / Self Care | Payer: Medicare Other | Source: Ambulatory Visit | Attending: Vascular Surgery | Admitting: Vascular Surgery

## 2019-01-02 ENCOUNTER — Other Ambulatory Visit: Payer: Self-pay

## 2019-01-02 ENCOUNTER — Encounter: Payer: Self-pay | Admitting: Vascular Surgery

## 2019-01-02 ENCOUNTER — Ambulatory Visit: Payer: Medicare Other | Admitting: Vascular Surgery

## 2019-01-02 VITALS — BP 145/81 | HR 62 | Resp 18 | Ht 70.5 in | Wt 240.0 lb

## 2019-01-02 DIAGNOSIS — R600 Localized edema: Secondary | ICD-10-CM | POA: Insufficient documentation

## 2019-01-02 DIAGNOSIS — R6889 Other general symptoms and signs: Secondary | ICD-10-CM

## 2019-01-02 NOTE — Progress Notes (Addendum)
Requested by:  Maury Dus, [address], [place] [address]  Reason for consultation: abnormal ABIs recorded by insurance screening   History of Present Illness   David Barton is a 85 y.o. (26-Mar-1933) male who presents to clinic having had abnormal ABIs found by a insurance screening study.  This study was repeated in office today and demonstrated normal ABIs bilaterally with normal toe pressures as well.  He denies any claudication, rest pain, or active tissue ischemia.  Patient has been treated in the past for congestive heart failure by Dr. Alvester Chou.  He has had edema of bilateral lower extremities in the past however this nearly resolved with a diuretic.  He is not bothered by heaviness or fatigue legs and has no interest in wearing compression.  He does have some itchiness.  He denies any history of venous ulcerations or DVTs.  He has never been worked up for venous insufficiency.  Past Medical History:  Diagnosis Date  . Alcohol abuse   . Colon cancer ([place])   . COPD (chronic obstructive pulmonary disease) ([place])   . Emphysema of lung ([place])   . Former tobacco use   . Hypertension   . Peripheral edema     Past Surgical History:  Procedure Laterality Date  . CATARACT EXTRACTION    . COLON RESECTION    . COLONOSCOPY    . MINOR HEMORRHOIDECTOMY  1995  . TONSILLECTOMY      Social History   Socioeconomic History  . Marital status: Married    Spouse name: Not on file  . Number of children: 3  . Years of education: Not on file  . Highest education level: Not on file  Occupational History  . Occupation: Retired- Associate Professor  Social Needs  . Financial resource strain: Not on file  . Food insecurity:    Worry: Not on file    Inability: Not on file  . Transportation needs:    Medical: Not on file    Non-medical: Not on file  Tobacco Use  . Smoking status: Former Smoker    Packs/day: 1.00    Years: 20.00    Pack years:  20.00    Types: Cigarettes    Last attempt to quit: 12/20/1981    Years since quitting: 37.0  . Smokeless tobacco: Never Used  Substance and Sexual Activity  . Alcohol use: Yes    Alcohol/week: 7.0 standard drinks    Types: 7 Standard drinks or equivalent per week    Comment: states he drinks 3-4 days a week, "a couple" on those occasions but does not further quaniity.  . Drug use: No  . Sexual activity: Never  Lifestyle  . Physical activity:    Days per week: Not on file    Minutes per session: Not on file  . Stress: Not on file  Relationships  . Social connections:    Talks on phone: Not on file    Gets together: Not on file    Attends religious service: Not on file    Active member of club or organization: Not on file    Attends meetings of clubs or organizations: Not on file    Relationship status: Not on file  . Intimate partner violence:    Fear of current or ex partner: Not on file    Emotionally abused: Not on file    Physically abused: Not on file    Forced sexual activity: Not on file  Other Topics Concern  . Not on file  Social History Narrative  . Not on file    Family History  Problem Relation Age of Onset  . Brain cancer Father   . Colon cancer Mother        with mets to liver  . Lung cancer Brother        was a smoker    Current Outpatient Medications  Medication Sig Dispense Refill  . ALLOPURINOL PO Take 20 mg by mouth.    Marland Kitchen aspirin EC 81 MG tablet Take 81 mg by mouth every morning.    . ferrous sulfate 325 (65 FE) MG tablet Take 325 mg by mouth daily with breakfast.    . furosemide (LASIX) 20 MG tablet Take 1 tablet (20 mg total) by mouth daily. 90 tablet 3  . Multiple Vitamin (MULTIVITAMIN WITH MINERALS) TABS tablet Take 1 tablet by mouth daily.    Marland Kitchen tiotropium (SPIRIVA) 18 MCG inhalation capsule Place 1 capsule (18 mcg total) into inhaler and inhale daily. (Patient taking differently: Place 18 mcg into inhaler and inhale as directed. ) 30 capsule  12  . bisoprolol (ZEBETA) 5 MG tablet Take 5 mg by mouth daily.     No current facility-administered medications for this visit.     Allergies  Allergen Reactions  . Flexeril [Cyclobenzaprine] Other (See Comments)    Unknown.     REVIEW OF SYSTEMS (negative unless checked):   Cardiac:  []  Chest pain or chest pressure? [x]  Shortness of breath upon activity? []  Shortness of breath when lying flat? []  Irregular heart rhythm?  Vascular:  []  Pain in calf, thigh, or hip brought on by walking? []  Pain in feet at night that wakes you up from your sleep? []  Blood clot in your veins? [x]  Leg swelling?  Pulmonary:  []  Oxygen at home? []  Productive cough? []  Wheezing?  Neurologic:  []  Sudden weakness in arms or legs? []  Sudden numbness in arms or legs? []  Sudden onset of difficult speaking or slurred speech? []  Temporary loss of vision in one eye? []  Problems with dizziness?  Gastrointestinal:  []  Blood in stool? []  Vomited blood?  Genitourinary:  []  Burning when urinating? []  Blood in urine?  Psychiatric:  []  Major depression  Hematologic:  []  Bleeding problems? []  Problems with blood clotting?  Dermatologic:  []  Rashes or ulcers?  Constitutional:  []  Fever or chills?  Ear/Nose/Throat:  []  Change in hearing? []  Nose bleeds? []  Sore throat?  Musculoskeletal:  []  Back pain? []  Joint pain? []  Muscle pain?   For VQI Use Only   PRE-ADM LIVING Home  AMB STATUS Ambulatory  CAD Sx None  PRIOR CHF Moderate  STRESS TEST No   Physical Examination     Vitals:   01/02/19 1451  BP: (!) 145/81  Pulse: 62  Resp: 18  SpO2: 90%  Weight: 240 lb (108.9 kg)  Height: 5' 10.5" (1.791 m)   Body mass index is 33.95 kg/m.  General Alert, O x 3, WD, NAD  Head Velva/AT,    Neck Supple, mid-line trachea,    Pulmonary Sym exp, good B air movt,   Cardiac RRR, Nl S1, S2,  Vascular Vessel Right Left  Radial Palpable Palpable  Brachial Palpable Palpable  Carotid  Palpable, No Bruit Palpable, No Bruit  Aorta Not palpable N/A  DP Palpable Palpable    Gastro- intestinal soft, non-distended, non-tender to palpation,   Musculo- skeletal M/S 5/5 throughout  , Extremities without ischemic changes  ,  No edema present, venous congestion in feet with flaking skin, purplish discoloration, no venous ulceration  Neurologic Cranial nerves 2-12 intact  Psychiatric Judgement intact, Mood & affect appropriate for pt's clinical situation  Dermatologic See M/S exam for extremity exam, No rashes otherwise noted    Non-Invasive Vascular imaging   BLE ABI (01/02/19)  R:   ABI: >1   PT: tri  DP: tri  TBI:  0.9  L:   ABI: >1,   PT: tri  DP: tri  TBI: 0.9   Medical Decision Making   David Barton is a 85 y.o. male who presents to recheck ABIs after abnormal ABIs recorded by insurance screening test   Bilateral ABIs and TBI's within normal limits today  Patient has palpable DP pulses bilaterally Coumadin is without any signs or symptoms of peripheral arterial disease  No utility in repeating ABIs in the future  Patient likely has some level of venous insufficiency given venous congestion and edematous bilateral lower extremities to the level of the ankle  Patient is not bothered with any signs or symptoms related to venous insufficiency and does not want further work-up at this time  Patient will call/return to office in the future if he would like venous reflux study performed   This patient was seen in conjunction with Dr. Suzette Battiest PA-C [facility] Office: [phone]  01/02/2019, 3:24 PM   I have seen and evaluated the patient. I agree with the PA note as documented above. Palpable pedal pulses and normal ABI's.  Was referred after insurance screen was concerning for PAD.  No rest pain, claudication, non-healing wounds etc and do not suspect he has any PAD that warrants intervention.   Suspect he probably has venous insufficiency but does not want to pursue that work-up at this time and discussed venous reflux study, etc.  Marty Heck, MD [facility] Office: [phone] Pager: [phone]

## 2019-01-17 ENCOUNTER — Ambulatory Visit: Payer: Medicare Other | Admitting: Cardiovascular Disease

## 2019-01-17 ENCOUNTER — Encounter: Payer: Self-pay | Admitting: Cardiovascular Disease

## 2019-01-17 VITALS — BP 159/78 | HR 60 | Ht 70.0 in | Wt 229.0 lb

## 2019-01-17 DIAGNOSIS — Z789 Other specified health status: Secondary | ICD-10-CM | POA: Diagnosis not present

## 2019-01-17 DIAGNOSIS — I10 Essential (primary) hypertension: Secondary | ICD-10-CM

## 2019-01-17 DIAGNOSIS — I5032 Chronic diastolic (congestive) heart failure: Secondary | ICD-10-CM | POA: Insufficient documentation

## 2019-01-17 NOTE — Patient Instructions (Signed)
Medication Instructions:  NONE If you need a refill on your cardiac medications before your next appointment, please call your pharmacy.   Lab work: Your physician recommends that you return for lab work in 2-3 WEEKS: BMET, LIPID, LIVER  If you have labs (blood work) drawn today and your tests are completely normal, you will receive your results only by: Marland Kitchen MyChart Message (if you have MyChart) OR . A paper copy in the mail If you have any lab test that is abnormal or we need to change your treatment, we will call you to review the results.  Testing/Procedures: NONE  Follow-Up: At [facility], you and your health needs are our priority.  As part of our continuing mission to provide you with exceptional heart care, we have created designated Provider Care Teams.  These Care Teams include your primary Cardiologist (physician) and Advanced Practice Providers (APPs -  Physician Assistants and Nurse Practitioners) who all work together to provide you with the care you need, when you need it. . You will need a follow up appointment in 3 MONTHS WITH AN APP AND 6 MONTHS WITH DR. Gwenlyn Found.  Please call our office 2 months in advance to schedule this appointment.  You may see Dr. Gwenlyn Found or one of the following Advanced Practice Providers on your designated Care Team:   . Kerin Ransom, [place] . Almyra Deforest, PA-C . Fabian Sharp, PA-C . Jory Sims, DNP . Rosaria Ferries, PA-C . Roby Lofts, PA-C . Sande Rives, PA-C

## 2019-01-17 NOTE — Assessment & Plan Note (Signed)
History of chronic diastolic heart failure echo performed 12/27/2018 revealing normal LV systolic function mild concentric LVH grade 1 diastolic dysfunction.  He is on 20 mg of furosemide.  He is dropped 11 pounds in weight has no peripheral edema.  His breathing is improved.  He does have moderate renal insufficiency.  He is aware of salt restriction.

## 2019-01-17 NOTE — Assessment & Plan Note (Signed)
History of essential hypertension with blood pressure measured today of 159/78.  He is on Zebeta.  Continue current meds at current dosing.

## 2019-01-17 NOTE — Progress Notes (Signed)
01/17/2019 David Barton   1933-05-20  [id number]  Primary Physician Maury Dus, MD Primary Cardiologist: Lorretta Harp MD FACP, [place], [place], [place]  HPI:  David Barton is a 85 y.o.  mild to moderately overweight married Caucasian male father of 2, grandfather one grandchild who I initially saw during a hospitalization in November during episode of sepsis.  I last saw him in the office 12/21/2018.  He has a history of remote tobacco abuse. He underwent [employer] which he retired from an age 30. He has lost 50 pounds intentionally and feels much better. There is a history of colon cancer. When I saw him in November he was staring episode of sepsis and mildly elevated troponins. Also had some PSVT. His EF at that time was 30-35% which is ultimately improved this 55-60% by 2-D echo done recently. He is completely asymptomatic. Since I saw him March 2018 he has gained 40 pounds.  He does admit to dietary indiscretion.  He has had increasing dyspnea on exertion over the last several weeks which improved with the ministration of low-dose oral diuretics.  Edema improved as well.  He says that he watches his salt.  He denies chest pain. Since I saw him a month ago I did obtain a 2D echo that revealed normal LV systolic function with grade 1 diastolic dysfunction.  He is lost 11 pounds on furosemide 20 mg a day with moderate renal insufficiency which is stable.  His lower extreme edema has resolved and his breathing has improved.  He is aware of salt restriction.  Current Meds  Medication Sig  . ALLOPURINOL PO Take 20 mg by mouth.  Marland Kitchen aspirin EC 81 MG tablet Take 81 mg by mouth every morning.  . bisoprolol (ZEBETA) 5 MG tablet Take 5 mg by mouth daily.  . ferrous sulfate 325 (65 FE) MG tablet Take 325 mg by mouth daily with breakfast.  . furosemide (LASIX) 20 MG tablet Take 1 tablet (20 mg total) by mouth daily.  . Multiple Vitamin (MULTIVITAMIN WITH MINERALS) TABS tablet Take 1 tablet by mouth  daily.  Marland Kitchen tiotropium (SPIRIVA) 18 MCG inhalation capsule Place 1 capsule (18 mcg total) into inhaler and inhale daily. (Patient taking differently: Place 18 mcg into inhaler and inhale as directed. )     Allergies  Allergen Reactions  . Flexeril [Cyclobenzaprine] Other (See Comments)    Unknown.     Social History   Socioeconomic History  . Marital status: Married    Spouse name: Not on file  . Number of children: 3  . Years of education: Not on file  . Highest education level: Not on file  Occupational History  . Occupation: Retired- Associate Professor  Social Needs  . Financial resource strain: Not on file  . Food insecurity:    Worry: Not on file    Inability: Not on file  . Transportation needs:    Medical: Not on file    Non-medical: Not on file  Tobacco Use  . Smoking status: Former Smoker    Packs/day: 1.00    Years: 20.00    Pack years: 20.00    Types: Cigarettes    Last attempt to quit: 12/20/1981    Years since quitting: 37.1  . Smokeless tobacco: Never Used  Substance and Sexual Activity  . Alcohol use: Yes    Alcohol/week: 7.0 standard drinks    Types: 7 Standard drinks or equivalent per week    Comment:  states he drinks 3-4 days a week, "a couple" on those occasions but does not further quaniity.  . Drug use: No  . Sexual activity: Never  Lifestyle  . Physical activity:    Days per week: Not on file    Minutes per session: Not on file  . Stress: Not on file  Relationships  . Social connections:    Talks on phone: Not on file    Gets together: Not on file    Attends religious service: Not on file    Active member of club or organization: Not on file    Attends meetings of clubs or organizations: Not on file    Relationship status: Not on file  . Intimate partner violence:    Fear of current or ex partner: Not on file    Emotionally abused: Not on file    Physically abused: Not on file    Forced sexual activity: Not on file  Other Topics  Concern  . Not on file  Social History Narrative  . Not on file     Review of Systems: General: negative for chills, fever, night sweats or weight changes.  Cardiovascular: negative for chest pain, dyspnea on exertion, edema, orthopnea, palpitations, paroxysmal nocturnal dyspnea or shortness of breath Dermatological: negative for rash Respiratory: negative for cough or wheezing Urologic: negative for hematuria Abdominal: negative for nausea, vomiting, diarrhea, bright red blood per rectum, melena, or hematemesis Neurologic: negative for visual changes, syncope, or dizziness All other systems reviewed and are otherwise negative except as noted above.    Blood pressure (!) 159/78, pulse 60, height 5\' 10"  (1.778 m), weight 229 lb (103.9 kg), SpO2 91 %.  General appearance: alert and no distress Neck: no adenopathy, no carotid bruit, no JVD, supple, symmetrical, trachea midline and thyroid not enlarged, symmetric, no tenderness/mass/nodules Lungs: clear to auscultation bilaterally Heart: regular rate and rhythm, S1, S2 normal, no murmur, click, rub or gallop Extremities: extremities normal, atraumatic, no cyanosis or edema Pulses: 2+ and symmetric Skin: Skin color, texture, turgor normal. No rashes or lesions Neurologic: Alert and oriented X 3, normal strength and tone. Normal symmetric reflexes. Normal coordination and gait  EKG not performed today  ASSESSMENT AND PLAN:   Essential hypertension History of essential hypertension with blood pressure measured today of 159/78.  He is on Zebeta.  Continue current meds at current dosing.  Chronic diastolic heart failure ([name]) History of chronic diastolic heart failure echo performed 12/27/2018 revealing normal LV systolic function mild concentric LVH grade 1 diastolic dysfunction.  He is on 20 mg of furosemide.  He is dropped 11 pounds in weight has no peripheral edema.  His breathing is improved.  He does have moderate renal insufficiency.   He is aware of salt restriction.      Lorretta Harp MD FACP,FACC,FAHA, [facility] 01/17/2019 2:23 PM

## 2019-03-03 ENCOUNTER — Emergency Department (HOSPITAL_COMMUNITY): Payer: Medicare Other

## 2019-03-03 ENCOUNTER — Inpatient Hospital Stay (HOSPITAL_COMMUNITY)
Admission: EM | Admit: 2019-03-03 | Discharge: 2019-03-26 | DRG: 871 | Disposition: A | Discharge status: Home / Self Care | Payer: Medicare Other | Attending: Internal Medicine | Admitting: Internal Medicine

## 2019-03-03 ENCOUNTER — Encounter (HOSPITAL_COMMUNITY): Payer: Self-pay | Admitting: Emergency Medicine

## 2019-03-03 ENCOUNTER — Other Ambulatory Visit: Payer: Self-pay

## 2019-03-03 DIAGNOSIS — J9811 Atelectasis: Secondary | ICD-10-CM | POA: Diagnosis not present

## 2019-03-03 DIAGNOSIS — J441 Chronic obstructive pulmonary disease with (acute) exacerbation: Secondary | ICD-10-CM

## 2019-03-03 DIAGNOSIS — R319 Hematuria, unspecified: Secondary | ICD-10-CM | POA: Diagnosis present

## 2019-03-03 DIAGNOSIS — R5381 Other malaise: Secondary | ICD-10-CM | POA: Diagnosis present

## 2019-03-03 DIAGNOSIS — I471 Supraventricular tachycardia: Secondary | ICD-10-CM | POA: Diagnosis not present

## 2019-03-03 DIAGNOSIS — I5031 Acute diastolic (congestive) heart failure: Secondary | ICD-10-CM | POA: Diagnosis not present

## 2019-03-03 DIAGNOSIS — Z6834 Body mass index (BMI) 34.0-34.9, adult: Secondary | ICD-10-CM

## 2019-03-03 DIAGNOSIS — Z801 Family history of malignant neoplasm of trachea, bronchus and lung: Secondary | ICD-10-CM

## 2019-03-03 DIAGNOSIS — I428 Other cardiomyopathies: Secondary | ICD-10-CM | POA: Diagnosis present

## 2019-03-03 DIAGNOSIS — J069 Acute upper respiratory infection, unspecified: Secondary | ICD-10-CM | POA: Diagnosis not present

## 2019-03-03 DIAGNOSIS — Z66 Do not resuscitate: Secondary | ICD-10-CM | POA: Diagnosis present

## 2019-03-03 DIAGNOSIS — A403 Sepsis due to Streptococcus pneumoniae: Secondary | ICD-10-CM | POA: Diagnosis present

## 2019-03-03 DIAGNOSIS — F1010 Alcohol abuse, uncomplicated: Secondary | ICD-10-CM | POA: Diagnosis present

## 2019-03-03 DIAGNOSIS — E875 Hyperkalemia: Secondary | ICD-10-CM | POA: Diagnosis present

## 2019-03-03 DIAGNOSIS — J188 Other pneumonia, unspecified organism: Secondary | ICD-10-CM

## 2019-03-03 DIAGNOSIS — I493 Ventricular premature depolarization: Secondary | ICD-10-CM | POA: Diagnosis not present

## 2019-03-03 DIAGNOSIS — G4733 Obstructive sleep apnea (adult) (pediatric): Secondary | ICD-10-CM | POA: Diagnosis present

## 2019-03-03 DIAGNOSIS — J9600 Acute respiratory failure, unspecified whether with hypoxia or hypercapnia: Secondary | ICD-10-CM | POA: Diagnosis not present

## 2019-03-03 DIAGNOSIS — N170 Acute kidney failure with tubular necrosis: Secondary | ICD-10-CM | POA: Diagnosis present

## 2019-03-03 DIAGNOSIS — Z9049 Acquired absence of other specified parts of digestive tract: Secondary | ICD-10-CM

## 2019-03-03 DIAGNOSIS — Z800 Family history of malignant neoplasm of digestive organs: Secondary | ICD-10-CM

## 2019-03-03 DIAGNOSIS — G9341 Metabolic encephalopathy: Secondary | ICD-10-CM | POA: Diagnosis present

## 2019-03-03 DIAGNOSIS — R6521 Severe sepsis with septic shock: Secondary | ICD-10-CM | POA: Diagnosis present

## 2019-03-03 DIAGNOSIS — J811 Chronic pulmonary edema: Secondary | ICD-10-CM | POA: Diagnosis present

## 2019-03-03 DIAGNOSIS — E876 Hypokalemia: Secondary | ICD-10-CM | POA: Diagnosis not present

## 2019-03-03 DIAGNOSIS — N184 Chronic kidney disease, stage 4 (severe): Secondary | ICD-10-CM | POA: Diagnosis present

## 2019-03-03 DIAGNOSIS — D7589 Other specified diseases of blood and blood-forming organs: Secondary | ICD-10-CM | POA: Diagnosis present

## 2019-03-03 DIAGNOSIS — M7989 Other specified soft tissue disorders: Secondary | ICD-10-CM | POA: Diagnosis present

## 2019-03-03 DIAGNOSIS — N2581 Secondary hyperparathyroidism of renal origin: Secondary | ICD-10-CM | POA: Diagnosis present

## 2019-03-03 DIAGNOSIS — Z03818 Encounter for observation for suspected exposure to other biological agents ruled out: Secondary | ICD-10-CM

## 2019-03-03 DIAGNOSIS — I130 Hypertensive heart and chronic kidney disease with heart failure and stage 1 through stage 4 chronic kidney disease, or unspecified chronic kidney disease: Secondary | ICD-10-CM | POA: Diagnosis present

## 2019-03-03 DIAGNOSIS — R0602 Shortness of breath: Secondary | ICD-10-CM

## 2019-03-03 DIAGNOSIS — N178 Other acute kidney failure: Secondary | ICD-10-CM | POA: Diagnosis not present

## 2019-03-03 DIAGNOSIS — Z7952 Long term (current) use of systemic steroids: Secondary | ICD-10-CM

## 2019-03-03 DIAGNOSIS — Z85038 Personal history of other malignant neoplasm of large intestine: Secondary | ICD-10-CM

## 2019-03-03 DIAGNOSIS — J181 Lobar pneumonia, unspecified organism: Secondary | ICD-10-CM | POA: Diagnosis not present

## 2019-03-03 DIAGNOSIS — R8281 Pyuria: Secondary | ICD-10-CM | POA: Diagnosis present

## 2019-03-03 DIAGNOSIS — I10 Essential (primary) hypertension: Secondary | ICD-10-CM | POA: Diagnosis not present

## 2019-03-03 DIAGNOSIS — R0600 Dyspnea, unspecified: Secondary | ICD-10-CM

## 2019-03-03 DIAGNOSIS — I509 Heart failure, unspecified: Secondary | ICD-10-CM

## 2019-03-03 DIAGNOSIS — R0902 Hypoxemia: Secondary | ICD-10-CM | POA: Diagnosis not present

## 2019-03-03 DIAGNOSIS — N179 Acute kidney failure, unspecified: Secondary | ICD-10-CM

## 2019-03-03 DIAGNOSIS — D631 Anemia in chronic kidney disease: Secondary | ICD-10-CM | POA: Diagnosis present

## 2019-03-03 DIAGNOSIS — I5021 Acute systolic (congestive) heart failure: Secondary | ICD-10-CM | POA: Diagnosis not present

## 2019-03-03 DIAGNOSIS — R0603 Acute respiratory distress: Secondary | ICD-10-CM | POA: Diagnosis not present

## 2019-03-03 DIAGNOSIS — I4891 Unspecified atrial fibrillation: Secondary | ICD-10-CM | POA: Diagnosis not present

## 2019-03-03 DIAGNOSIS — J189 Pneumonia, unspecified organism: Secondary | ICD-10-CM | POA: Diagnosis not present

## 2019-03-03 DIAGNOSIS — Z888 Allergy status to other drugs, medicaments and biological substances status: Secondary | ICD-10-CM

## 2019-03-03 DIAGNOSIS — I4811 Longstanding persistent atrial fibrillation: Secondary | ICD-10-CM

## 2019-03-03 DIAGNOSIS — Z7982 Long term (current) use of aspirin: Secondary | ICD-10-CM

## 2019-03-03 DIAGNOSIS — E1122 Type 2 diabetes mellitus with diabetic chronic kidney disease: Secondary | ICD-10-CM | POA: Diagnosis present

## 2019-03-03 DIAGNOSIS — J440 Chronic obstructive pulmonary disease with acute lower respiratory infection: Secondary | ICD-10-CM | POA: Diagnosis present

## 2019-03-03 DIAGNOSIS — R6510 Systemic inflammatory response syndrome (SIRS) of non-infectious origin without acute organ dysfunction: Secondary | ICD-10-CM

## 2019-03-03 DIAGNOSIS — D72829 Elevated white blood cell count, unspecified: Secondary | ICD-10-CM | POA: Diagnosis not present

## 2019-03-03 DIAGNOSIS — L8990 Pressure ulcer of unspecified site, unspecified stage: Secondary | ICD-10-CM

## 2019-03-03 DIAGNOSIS — I480 Paroxysmal atrial fibrillation: Secondary | ICD-10-CM

## 2019-03-03 DIAGNOSIS — R600 Localized edema: Secondary | ICD-10-CM | POA: Diagnosis not present

## 2019-03-03 DIAGNOSIS — E8809 Other disorders of plasma-protein metabolism, not elsewhere classified: Secondary | ICD-10-CM | POA: Diagnosis not present

## 2019-03-03 DIAGNOSIS — I5033 Acute on chronic diastolic (congestive) heart failure: Secondary | ICD-10-CM | POA: Diagnosis not present

## 2019-03-03 DIAGNOSIS — I4819 Other persistent atrial fibrillation: Secondary | ICD-10-CM | POA: Diagnosis present

## 2019-03-03 DIAGNOSIS — I5043 Acute on chronic combined systolic (congestive) and diastolic (congestive) heart failure: Secondary | ICD-10-CM | POA: Diagnosis present

## 2019-03-03 DIAGNOSIS — E8770 Fluid overload, unspecified: Secondary | ICD-10-CM | POA: Diagnosis not present

## 2019-03-03 DIAGNOSIS — J9601 Acute respiratory failure with hypoxia: Secondary | ICD-10-CM | POA: Diagnosis present

## 2019-03-03 DIAGNOSIS — J13 Pneumonia due to Streptococcus pneumoniae: Secondary | ICD-10-CM | POA: Diagnosis not present

## 2019-03-03 DIAGNOSIS — Z8701 Personal history of pneumonia (recurrent): Secondary | ICD-10-CM

## 2019-03-03 DIAGNOSIS — Z87891 Personal history of nicotine dependence: Secondary | ICD-10-CM

## 2019-03-03 DIAGNOSIS — E8889 Other specified metabolic disorders: Secondary | ICD-10-CM | POA: Diagnosis present

## 2019-03-03 DIAGNOSIS — J159 Unspecified bacterial pneumonia: Secondary | ICD-10-CM | POA: Diagnosis present

## 2019-03-03 DIAGNOSIS — Z7901 Long term (current) use of anticoagulants: Secondary | ICD-10-CM

## 2019-03-03 DIAGNOSIS — Z79899 Other long term (current) drug therapy: Secondary | ICD-10-CM

## 2019-03-03 DIAGNOSIS — J90 Pleural effusion, not elsewhere classified: Secondary | ICD-10-CM | POA: Diagnosis not present

## 2019-03-03 DIAGNOSIS — A419 Sepsis, unspecified organism: Secondary | ICD-10-CM | POA: Diagnosis present

## 2019-03-03 HISTORY — DX: Sepsis, unspecified organism: A41.9

## 2019-03-03 LAB — RESPIRATORY PANEL BY PCR
Adenovirus: NOT DETECTED
Bordetella pertussis: NOT DETECTED
CORONAVIRUS OC43-RVPPCR: NOT DETECTED
Chlamydophila pneumoniae: NOT DETECTED
Coronavirus 229E: NOT DETECTED
Coronavirus HKU1: NOT DETECTED
Coronavirus NL63: NOT DETECTED
Influenza A: NOT DETECTED
Influenza B: NOT DETECTED
Metapneumovirus: NOT DETECTED
Mycoplasma pneumoniae: NOT DETECTED
PARAINFLUENZA VIRUS 2-RVPPCR: NOT DETECTED
PARAINFLUENZA VIRUS 3-RVPPCR: NOT DETECTED
Parainfluenza Virus 1: NOT DETECTED
Parainfluenza Virus 4: NOT DETECTED
Respiratory Syncytial Virus: NOT DETECTED
Rhinovirus / Enterovirus: NOT DETECTED

## 2019-03-03 LAB — BASIC METABOLIC PANEL
Anion gap: 11 (ref 5–15)
BUN: 70 mg/dL — ABNORMAL HIGH (ref 8–23)
CO2: 22 mmol/L (ref 22–32)
Calcium: 7.1 mg/dL — ABNORMAL LOW (ref 8.9–10.3)
Chloride: 104 mmol/L (ref 98–111)
Creatinine, Ser: 4.36 mg/dL — ABNORMAL HIGH (ref 0.61–1.24)
GFR calc non Af Amer: 12 mL/min — ABNORMAL LOW (ref >60)
GFR, EST AFRICAN AMERICAN: 14 mL/min — AB (ref >60)
Glucose, Bld: 152 mg/dL — ABNORMAL HIGH (ref 70–99)
POTASSIUM: 5.7 mmol/L — AB (ref 3.5–5.1)
Sodium: 137 mmol/L (ref 135–145)

## 2019-03-03 LAB — URINALYSIS, ROUTINE W REFLEX MICROSCOPIC
Bilirubin Urine: NEGATIVE
Glucose, UA: NEGATIVE mg/dL
Ketones, ur: NEGATIVE mg/dL
Nitrite: NEGATIVE
Protein, ur: 100 mg/dL — AB
RBC / HPF: >50 RBC/hpf — ABNORMAL HIGH (ref 0–5)
SPECIFIC GRAVITY, URINE: 1.021 (ref 1.005–1.030)
pH: 5 (ref 5.0–8.0)

## 2019-03-03 LAB — COMPREHENSIVE METABOLIC PANEL
ALT: 10 U/L (ref 0–44)
AST: 24 U/L (ref 15–41)
Albumin: 2.5 g/dL — ABNORMAL LOW (ref 3.5–5.0)
Alkaline Phosphatase: 75 U/L (ref 38–126)
Anion gap: 13 (ref 5–15)
BUN: 75 mg/dL — ABNORMAL HIGH (ref 8–23)
CO2: 26 mmol/L (ref 22–32)
Calcium: 8.2 mg/dL — ABNORMAL LOW (ref 8.9–10.3)
Chloride: 99 mmol/L (ref 98–111)
Creatinine, Ser: 4.5 mg/dL — ABNORMAL HIGH (ref 0.61–1.24)
GFR calc Af Amer: 13 mL/min — ABNORMAL LOW (ref >60)
GFR calc non Af Amer: 11 mL/min — ABNORMAL LOW (ref >60)
Glucose, Bld: 121 mg/dL — ABNORMAL HIGH (ref 70–99)
Potassium: 5.6 mmol/L — ABNORMAL HIGH (ref 3.5–5.1)
Sodium: 138 mmol/L (ref 135–145)
Total Bilirubin: 0.6 mg/dL (ref 0.3–1.2)
Total Protein: 7 g/dL (ref 6.5–8.1)

## 2019-03-03 LAB — BLOOD GAS, ARTERIAL
Acid-base deficit: 1.5 mmol/L (ref 0.0–2.0)
Acid-base deficit: 2.9 mmol/L — ABNORMAL HIGH (ref 0.0–2.0)
Bicarbonate: 24.3 mmol/L (ref 20.0–28.0)
Bicarbonate: 21.8 mmol/L (ref 20.0–28.0)
DRAWN BY: 21338
FIO2: 0.1
FIO2: 100
MECHVT: 600 mL
MECHVT: 600 mL
O2 SAT: 95.5 %
O2 Saturation: 98.7 %
PEEP/CPAP: 5 cmH2O
PEEP: 5 cmH2O
PO2 ART: 85.5 mmHg (ref 83.0–108.0)
Patient temperature: 98.6
Patient temperature: 98.9
RATE: 15 resp/min
RATE: 18 resp/min
pCO2 arterial: 52.5 mmHg — ABNORMAL HIGH (ref 32.0–48.0)
pCO2 arterial: 40.9 mmHg (ref 32.0–48.0)
pH, Arterial: 7.286 — ABNORMAL LOW (ref 7.350–7.450)
pH, Arterial: 7.347 — ABNORMAL LOW (ref 7.350–7.450)
pO2, Arterial: 145 mmHg — ABNORMAL HIGH (ref 83.0–108.0)

## 2019-03-03 LAB — CBC
HCT: 41.3 % (ref 39.0–52.0)
Hemoglobin: 12.6 g/dL — ABNORMAL LOW (ref 13.0–17.0)
MCH: 32.8 pg (ref 26.0–34.0)
MCHC: 30.5 g/dL (ref 30.0–36.0)
MCV: 107.6 fL — ABNORMAL HIGH (ref 80.0–100.0)
Platelets: 231 10*3/uL (ref 150–400)
RBC: 3.84 MIL/uL — AB (ref 4.22–5.81)
RDW: 17 % — ABNORMAL HIGH (ref 11.5–15.5)
WBC: 29.6 10*3/uL — ABNORMAL HIGH (ref 4.0–10.5)
nRBC: 0.1 % (ref 0.0–0.2)

## 2019-03-03 LAB — POCT I-STAT EG7
Acid-base deficit: 1 mmol/L (ref 0.0–2.0)
Bicarbonate: 27.9 mmol/L (ref 20.0–28.0)
Calcium, Ion: 1.02 mmol/L — ABNORMAL LOW (ref 1.15–1.40)
HCT: 38 % — ABNORMAL LOW (ref 39.0–52.0)
Hemoglobin: 12.9 g/dL — ABNORMAL LOW (ref 13.0–17.0)
O2 Saturation: 95 %
Potassium: 5.3 mmol/L — ABNORMAL HIGH (ref 3.5–5.1)
Sodium: 139 mmol/L (ref 135–145)
TCO2: 30 mmol/L (ref 22–32)
pCO2, Ven: 69.3 mmHg — ABNORMAL HIGH (ref 44.0–60.0)
pH, Ven: 7.213 — ABNORMAL LOW (ref 7.250–7.430)
pO2, Ven: 97 mmHg — ABNORMAL HIGH (ref 32.0–45.0)

## 2019-03-03 LAB — LACTIC ACID, PLASMA
Lactic Acid, Venous: 1.6 mmol/L (ref 0.5–1.9)
Lactic Acid, Venous: 2.2 mmol/L (ref 0.5–1.9)

## 2019-03-03 LAB — GLUCOSE, CAPILLARY
GLUCOSE-CAPILLARY: 138 mg/dL — AB (ref 70–99)
Glucose-Capillary: 140 mg/dL — ABNORMAL HIGH (ref 70–99)
Glucose-Capillary: 153 mg/dL — ABNORMAL HIGH (ref 70–99)

## 2019-03-03 LAB — CBC WITH DIFFERENTIAL/PLATELET
Abs Immature Granulocytes: 0 10*3/uL (ref 0.00–0.07)
Basophils Absolute: 0 10*3/uL (ref 0.0–0.1)
Basophils Relative: 0 %
Eosinophils Absolute: 0 10*3/uL (ref 0.0–0.5)
Eosinophils Relative: 0 %
HCT: 42.2 % (ref 39.0–52.0)
Hemoglobin: 12.7 g/dL — ABNORMAL LOW (ref 13.0–17.0)
LYMPHS ABS: 0.3 10*3/uL — AB (ref 0.7–4.0)
LYMPHS PCT: 1 %
MCH: 32.1 pg (ref 26.0–34.0)
MCHC: 30.1 g/dL (ref 30.0–36.0)
MCV: 106.6 fL — ABNORMAL HIGH (ref 80.0–100.0)
Monocytes Absolute: 3.8 10*3/uL — ABNORMAL HIGH (ref 0.1–1.0)
Monocytes Relative: 13 %
Neutro Abs: 25.3 10*3/uL — ABNORMAL HIGH (ref 1.7–7.7)
Neutrophils Relative %: 86 %
PLATELETS: 229 10*3/uL (ref 150–400)
RBC: 3.96 MIL/uL — ABNORMAL LOW (ref 4.22–5.81)
RDW: 17 % — ABNORMAL HIGH (ref 11.5–15.5)
WBC: 29.4 10*3/uL — ABNORMAL HIGH (ref 4.0–10.5)
nRBC: 0 /100 WBC
nRBC: 0.1 % (ref 0.0–0.2)

## 2019-03-03 LAB — INFLUENZA PANEL BY PCR (TYPE A & B)
Influenza A By PCR: NEGATIVE
Influenza B By PCR: NEGATIVE

## 2019-03-03 LAB — MRSA PCR SCREENING: MRSA by PCR: NEGATIVE

## 2019-03-03 MED ORDER — CHLORHEXIDINE GLUCONATE 0.12% ORAL RINSE (MEDLINE KIT)
15.0000 mL | Freq: Two times a day (BID) | OROMUCOSAL | Status: DC
  Administered 2019-03-03 – 2019-03-04 (×2): 15 mL via OROMUCOSAL

## 2019-03-03 MED ORDER — PROPOFOL 1000 MG/100ML IV EMUL
5.0000 ug/kg/min | INTRAVENOUS | Status: DC
  Administered 2019-03-03: 20 ug/kg/min via INTRAVENOUS
  Administered 2019-03-03: 5 ug/kg/min via INTRAVENOUS
  Administered 2019-03-04: 11 ug/kg/min via INTRAVENOUS
  Administered 2019-03-04: 20 ug/kg/min via INTRAVENOUS
  Filled 2019-03-03 (×5): qty 100

## 2019-03-03 MED ORDER — ETOMIDATE 2 MG/ML IV SOLN
INTRAVENOUS | Status: AC | PRN
  Administered 2019-03-03: 15 mg via INTRAVENOUS

## 2019-03-03 MED ORDER — NOREPINEPHRINE 4 MG/250ML-% IV SOLN
0.0000 ug/min | INTRAVENOUS | Status: DC
  Administered 2019-03-03: 10 ug/min via INTRAVENOUS
  Administered 2019-03-03: 6 ug/min via INTRAVENOUS
  Administered 2019-03-04 (×2): 7 ug/min via INTRAVENOUS
  Filled 2019-03-03 (×3): qty 250

## 2019-03-03 MED ORDER — HYDROMORPHONE HCL 1 MG/ML IJ SOLN
2.0000 mg | INTRAMUSCULAR | Status: DC | PRN
  Filled 2019-03-03: qty 2

## 2019-03-03 MED ORDER — ROCURONIUM BROMIDE 50 MG/5ML IV SOLN
INTRAVENOUS | Status: AC | PRN
  Administered 2019-03-03: 100 mg via INTRAVENOUS

## 2019-03-03 MED ORDER — SODIUM CHLORIDE 0.9 % IV BOLUS (SEPSIS)
1000.0000 mL | Freq: Once | INTRAVENOUS | Status: AC
  Administered 2019-03-03: 1000 mL via INTRAVENOUS

## 2019-03-03 MED ORDER — VITAL HIGH PROTEIN PO LIQD: 1000.0000 mL | ORAL | Status: DC

## 2019-03-03 MED ORDER — ORAL CARE MOUTH RINSE
15.0000 mL | OROMUCOSAL | Status: DC
  Administered 2019-03-03 – 2019-03-04 (×6): 15 mL via OROMUCOSAL

## 2019-03-03 MED ORDER — NOREPINEPHRINE 4 MG/250ML-% IV SOLN
INTRAVENOUS | Status: AC
  Filled 2019-03-03: qty 250

## 2019-03-03 MED ORDER — HEPARIN SODIUM (PORCINE) 5000 UNIT/ML IJ SOLN
5000.0000 [IU] | Freq: Three times a day (TID) | INTRAMUSCULAR | Status: DC
  Administered 2019-03-03 – 2019-03-06 (×9): 5000 [IU] via SUBCUTANEOUS
  Filled 2019-03-03 (×11): qty 1

## 2019-03-03 MED ORDER — SODIUM CHLORIDE 0.9 % IV SOLN
500.0000 mg | INTRAVENOUS | Status: DC
  Administered 2019-03-03 – 2019-03-06 (×4): 500 mg via INTRAVENOUS
  Filled 2019-03-03 (×4): qty 500

## 2019-03-03 MED ORDER — VITAL HIGH PROTEIN PO LIQD
1000.0000 mL | ORAL | Status: DC
  Administered 2019-03-03: 1000 mL

## 2019-03-03 MED ORDER — DOCUSATE SODIUM 100 MG PO CAPS
100.0000 mg | ORAL_CAPSULE | Freq: Two times a day (BID) | ORAL | Status: DC
  Administered 2019-03-03 – 2019-03-14 (×14): 100 mg via ORAL
  Filled 2019-03-03 (×35): qty 1

## 2019-03-03 MED ORDER — SODIUM CHLORIDE 0.9 % IV SOLN
2.0000 g | INTRAVENOUS | Status: AC
  Administered 2019-03-03 – 2019-03-09 (×7): 2 g via INTRAVENOUS
  Filled 2019-03-03 (×7): qty 20

## 2019-03-03 MED ORDER — FAMOTIDINE 20 MG IN NS 100 ML IVPB
20.0000 mg | INTRAVENOUS | Status: DC
  Administered 2019-03-03 – 2019-03-04 (×2): 20 mg via INTRAVENOUS
  Filled 2019-03-03 (×2): qty 100

## 2019-03-03 MED ORDER — SODIUM CHLORIDE 0.9 % IV BOLUS (SEPSIS)
500.0000 mL | Freq: Once | INTRAVENOUS | Status: AC
  Administered 2019-03-03: 500 mL via INTRAVENOUS

## 2019-03-03 MED ORDER — NOREPINEPHRINE BITARTRATE 1 MG/ML IV SOLN
INTRAVENOUS | Status: AC | PRN
  Administered 2019-03-03: 5 ug via INTRAVENOUS

## 2019-03-03 MED ORDER — SODIUM CHLORIDE 0.9 % IV SOLN
250.0000 mL | INTRAVENOUS | Status: DC
  Administered 2019-03-04: 250 mL via INTRAVENOUS

## 2019-03-03 MED ORDER — SODIUM CHLORIDE 0.9 % IV SOLN
INTRAVENOUS | Status: DC
  Administered 2019-03-03 – 2019-03-04 (×2): via INTRAVENOUS

## 2019-03-03 MED ORDER — ACETAMINOPHEN 650 MG RE SUPP
650.0000 mg | Freq: Once | RECTAL | Status: AC
  Administered 2019-03-03: 650 mg via RECTAL
  Filled 2019-03-03: qty 1

## 2019-03-03 MED ORDER — IPRATROPIUM-ALBUTEROL 0.5-2.5 (3) MG/3ML IN SOLN
3.0000 mL | Freq: Four times a day (QID) | RESPIRATORY_TRACT | Status: DC
  Administered 2019-03-03 – 2019-03-06 (×12): 3 mL via RESPIRATORY_TRACT
  Filled 2019-03-03 (×12): qty 3

## 2019-03-03 NOTE — ED Notes (Signed)
ED TO INPATIENT HANDOFF REPORT  ED Nurse Name and Phone #:  Gerald Stabs RN [phone]  S Name/Age/Gender David Barton 85 y.o. male Room/Bed: 024C/024C  Code Status   Code Status: DNR  Home/SNF/Other Home Patient oriented to: Intubated  Is this baseline? No   Triage Complete: Triage complete  Chief Complaint sepsis  Triage Note Per EMS pt from home recently tested for flu and was negagtive but given tamflu anyways yesterday EMS was called for AMS, Pt normally AOx4 now only alert to self, because wife thought he was having a stroke.  EMS last night assured he was not having a stroke so he did not come to the hospital.  Wife called out this morning due to increased AMS and SOB.  AO to self. NAD Not    Allergies Allergies  Allergen Reactions  . Flexeril [Cyclobenzaprine] Other (See Comments)    Unknown.     Level of Care/Admitting Diagnosis ED Disposition    ED Disposition Condition [place] [facility]: [place] [[id number]]  Level of Care: ICU [6]  Diagnosis: Sepsis [facility]) [[id number]]  Admitting Physician: Kipp Brood [[id number]]  Attending Physician: Kipp Brood [[id number]]  Estimated length of stay: 5 - 7 days  Certification:: I certify this patient will need inpatient services for at least 2 midnights  PT Class (Do Not Modify): Inpatient [101]  PT Acc Code (Do Not Modify): Private [1]       B Medical/Surgery History Past Medical History:  Diagnosis Date  . Alcohol abuse   . Colon cancer ([place])   . COPD (chronic obstructive pulmonary disease) ([place])   . Emphysema of lung ([place])   . Former tobacco use   . Hypertension   . Peripheral edema    Past Surgical History:  Procedure Laterality Date  . CATARACT EXTRACTION    . COLON RESECTION    . COLONOSCOPY    . MINOR HEMORRHOIDECTOMY  1995  . TONSILLECTOMY       A IV Location/Drains/Wounds Patient Lines/Drains/Airways Status   Active Line/Drains/Airways    Name:   Placement date:    Placement time:   Site:   Days:   Peripheral IV 03/03/19 Left Antecubital   03/03/19    -    Antecubital   less than 1   Peripheral IV 03/03/19 Right Antecubital   03/03/19    -    Antecubital   less than 1   Peripheral IV 03/03/19 Right Hand   03/03/19    1000    Hand   less than 1   External Urinary Catheter   11/08/16    0500    -   845   Airway 8 mm   03/03/19    1121     less than 1          Intake/Output Last 24 hours  Intake/Output Summary (Last 24 hours) at 03/03/2019 1208 Last data filed at 03/03/2019 1202 Gross per 24 hour  Intake 3850 ml  Output -  Net 3850 ml    Labs/Imaging Results for orders placed or performed during the hospital encounter of 03/03/19 (from the past 48 hour(s))  Lactic acid, plasma     Status: None   Collection Time: 03/03/19  9:47 AM  Result Value Ref Range   Lactic Acid, Venous 1.6 0.5 - 1.9 mmol/L    Comment: Performed at [facility], [address], [place], [place] [address]  Comprehensive metabolic panel  Status: Abnormal   Collection Time: 03/03/19  9:47 AM  Result Value Ref Range   Sodium 138 135 - 145 mmol/L   Potassium 5.6 (H) 3.5 - 5.1 mmol/L   Chloride 99 98 - 111 mmol/L   CO2 26 22 - 32 mmol/L   Glucose, Bld 121 (H) 70 - 99 mg/dL   BUN 75 (H) 8 - 23 mg/dL   Creatinine, Ser 4.50 (H) 0.61 - 1.24 mg/dL   Calcium 8.2 (L) 8.9 - 10.3 mg/dL   Total Protein 7.0 6.5 - 8.1 g/dL   Albumin 2.5 (L) 3.5 - 5.0 g/dL   AST 24 15 - 41 U/L   ALT 10 0 - 44 U/L   Alkaline Phosphatase 75 38 - 126 U/L   Total Bilirubin 0.6 0.3 - 1.2 mg/dL   GFR calc non Af Amer 11 (L) >60 mL/min   GFR calc Af Amer 13 (L) >60 mL/min   Anion gap 13 5 - 15    Comment: Performed at [facility], [place] [address], [place], [place] [address]  CBC WITH DIFFERENTIAL     Status: Abnormal   Collection Time: 03/03/19  9:47 AM  Result Value Ref Range   WBC 29.4 (H) 4.0 - 10.5 K/uL   RBC 3.96 (L) 4.22 - 5.81 MIL/uL   Hemoglobin 12.7 (L) 13.0 - 17.0 g/dL    HCT 42.2 39.0 - 52.0 %   MCV 106.6 (H) 80.0 - 100.0 fL   MCH 32.1 26.0 - 34.0 pg   MCHC 30.1 30.0 - 36.0 g/dL   RDW 17.0 (H) 11.5 - 15.5 %   Platelets 229 150 - 400 K/uL   nRBC 0.1 0.0 - 0.2 %   Neutrophils Relative % 86 %   Neutro Abs 25.3 (H) 1.7 - 7.7 K/uL   Lymphocytes Relative 1 %   Lymphs Abs 0.3 (L) 0.7 - 4.0 K/uL   Monocytes Relative 13 %   Monocytes Absolute 3.8 (H) 0.1 - 1.0 K/uL   Eosinophils Relative 0 %   Eosinophils Absolute 0.0 0.0 - 0.5 K/uL   Basophils Relative 0 %   Basophils Absolute 0.0 0.0 - 0.1 K/uL   nRBC 0 0 /100 WBC   Abs Immature Granulocytes 0.00 0.00 - 0.07 K/uL   Polychromasia PRESENT    Basophilic Stippling PRESENT     Comment: Performed at [facility], [place] [address], [place], [place] [address]  Influenza panel by PCR (type A & B)     Status: None   Collection Time: 03/03/19  9:54 AM  Result Value Ref Range   Influenza A By PCR NEGATIVE NEGATIVE   Influenza B By PCR NEGATIVE NEGATIVE    Comment: (NOTE) The Xpert Xpress Flu assay is intended as an aid in the diagnosis of  influenza and should not be used as a sole basis for treatment.  This  assay is FDA approved for nasopharyngeal swab specimens only. Nasal  washings and aspirates are unacceptable for Xpert Xpress Flu testing. Performed at [facility], [place] [address] , [place] [address]   POCT I-Stat EG7     Status: Abnormal   Collection Time: 03/03/19 10:28 AM  Result Value Ref Range   pH, Ven 7.213 (L) 7.250 - 7.430   pCO2, Ven 69.3 (H) 44.0 - 60.0 mmHg   pO2, Ven 97.0 (H) 32.0 - 45.0 mmHg   Bicarbonate 27.9 20.0 - 28.0 mmol/L   TCO2 30 22 - 32 mmol/L  O2 Saturation 95.0 %   Acid-base deficit 1.0 0.0 - 2.0 mmol/L   Sodium 139 135 - 145 mmol/L   Potassium 5.3 (H) 3.5 - 5.1 mmol/L   Calcium, Ion 1.02 (L) 1.15 - 1.40 mmol/L   HCT 38.0 (L) 39.0 - 52.0 %   Hemoglobin 12.9 (L) 13.0 - 17.0 g/dL   Patient temperature HIDE    Sample type VENOUS    Comment NOTIFIED  PHYSICIAN    Dg Chest Port 1 View  Result Date: 03/03/2019 CLINICAL DATA:  Fever, cough, shortness of breath EXAM: PORTABLE CHEST 1 VIEW COMPARISON:  11/17/2016 FINDINGS: Multifocal patchy opacities in the lingula, left lower lobe, and right middle lobe. This appearance suggests multifocal pneumonia. No pleural effusion or pneumothorax. Mild cardiomegaly. IMPRESSION: Multifocal patchy opacities, suggesting multifocal pneumonia. Electronically Signed   By: Julian Hy M.D.   On: 03/03/2019 10:28    Pending Labs Unresulted Labs (From admission, onward)    Start     Ordered   03/04/19 0500  Protime-INR  Tomorrow morning,   R     03/03/19 1139   03/04/19 0500  Cortisol-am, blood  Tomorrow morning,   R     03/03/19 1139   03/04/19 2549  Basic metabolic panel  Tomorrow morning,   R     03/03/19 1139   03/04/19 0500  CBC  Tomorrow morning,   R     03/03/19 1139   03/03/19 8264  Basic metabolic panel  Once,   R     03/03/19 1139   03/03/19 1132  CBC  (heparin)  Once,   R    Comments:  Baseline for heparin therapy IF NOT ALREADY DRAWN.  Notify MD if PLT < 100 K.    03/03/19 1139   03/03/19 1132  Creatinine, serum  (heparin)  Once,   R    Comments:  Baseline for heparin therapy IF NOT ALREADY DRAWN.    03/03/19 1139   03/03/19 1131  Culture, respiratory (non-expectorated)  ONCE - STAT,   R     03/03/19 1131   03/03/19 0924  Novel Coronavirus, NAA (hospital order; send-out to ref lab)  (Novel Coronavirus, NAA [facility] Order; send-out to ref lab) with precautions panel)  Once,   R    Question:  Patient immune status  Answer:  Normal   03/03/19 0923   03/03/19 0923  Lactic acid, plasma  Now then every 2 hours,   STAT     03/03/19 0923   03/03/19 0923  Blood Culture (routine x 2)  BLOOD CULTURE X 2,   STAT    Question:  Patient immune status  Answer:  Normal   03/03/19 0923   03/03/19 0923  Urinalysis, Routine w reflex microscopic  ONCE - STAT,   STAT     03/03/19 0923   03/03/19  0923  Blood gas, venous ([facility], [facility], [facility])  ONCE - STAT,   STAT     03/03/19 0923   03/03/19 0923  Respiratory Panel by PCR  (Respiratory virus panel with precautions)  Once,   R    Question:  Patient immune status  Answer:  Normal   03/03/19 0923          Vitals/Pain Today's Vitals   03/03/19 1137 03/03/19 1140 03/03/19 1150 03/03/19 1200  BP:  (!) 77/58 96/66 120/90  Pulse:  80 86 94  Resp:  15 15 15   Temp:      TempSrc:      SpO2:  97% 98% 99%  Weight:      Height:      PainSc: 0-No pain       Isolation Precautions Droplet and Contact precautions  Medications Medications  cefTRIAXone (ROCEPHIN) 2 g in sodium chloride 0.9 % 100 mL IVPB (0 g Intravenous Stopped 03/03/19 1126)  azithromycin (ZITHROMAX) 500 mg in sodium chloride 0.9 % 250 mL IVPB (0 mg Intravenous Stopped 03/03/19 1200)  norepinephrine (LEVOPHED) 4-5 MG/250ML-% infusion SOLN (has no administration in time range)  norepinephrine (LEVOPHED) 4mg  in 271mL premix infusion (6 mcg/min Intravenous Rate/Dose Change 03/03/19 1201)  heparin injection 5,000 Units (has no administration in time range)  0.9 %  sodium chloride infusion (has no administration in time range)  docusate sodium (COLACE) capsule 100 mg (100 mg Oral Not Given 03/03/19 1145)  famotidine (PEPCID) IVPB 20 mg in NS 100 mL IVPB (has no administration in time range)  feeding supplement (VITAL HIGH PROTEIN) liquid 1,000 mL (1,000 mLs Per Tube Not Given 03/03/19 1147)  sodium chloride 0.9 % bolus 1,000 mL (0 mLs Intravenous Stopped 03/03/19 1044)    And  sodium chloride 0.9 % bolus 1,000 mL (0 mLs Intravenous Stopped 03/03/19 1100)    And  sodium chloride 0.9 % bolus 1,000 mL (0 mLs Intravenous Stopped 03/03/19 1200)    And  sodium chloride 0.9 % bolus 500 mL (0 mLs Intravenous Stopped 03/03/19 1202)  acetaminophen (TYLENOL) suppository 650 mg (650 mg Rectal Given 03/03/19 0956)  etomidate (AMIDATE) injection (15 mg Intravenous Given 03/03/19 1111)  rocuronium  (ZEMURON) injection (100 mg Intravenous Given 03/03/19 1113)  norepinephrine (LEVOPHED) injection (5 mcg Intravenous Given 03/03/19 1116)    Mobility non-ambulatory High fall risk   Focused Assessments Cardiac Assessment Handoff:    Lab Results  Component Value Date   TROPONINI 0.11 ([place]) 11/09/2016   Lab Results  Component Value Date   DDIMER 1.04 (H) 05/08/2013   Does the Patient currently have chest pain? No  , Neuro Assessment Handoff:  Swallow screen pass? No          Neuro Assessment:   Neuro Checks:      Last Documented NIHSS Modified Score:   Has TPA been given? No If patient is a Neuro Trauma and patient is going to OR before floor call report to [place] nurse: [phone] or [phone]     R Recommendations: See Admitting Provider Note  Report given to:   Additional Notes:

## 2019-03-03 NOTE — ED Triage Notes (Signed)
Per EMS pt from home recently tested for flu and was negagtive but given tamflu anyways yesterday EMS was called for AMS, Pt normally AOx4 now only alert to self, because wife thought he was having a stroke.  EMS last night instructed them they they did not believe it was a stroke so they decided not to come to the hospital  Wife called out this morning due to increased AMS and SOB.  AO to self. NAD Noted at this time

## 2019-03-03 NOTE — H&P (Signed)
NAME:  David Barton, MRN:  [id number], DOB:  24-Aug-1933, LOS: 0 ADMISSION DATE:  03/03/2019, CONSULTATION DATE: 03/03/2019 REFERRING MD: [facility] ED, CHIEF COMPLAINT: Respiratory distress  HPI/course in hospital  85 year old man with a 4-day history of increasing shortness of breath with productive cough.  Flu swab negative at his physician's office on 3/12.  On Tamiflu empirically.  Developed fever and rigors 3/13 and was increasingly short of breath and altered this morning prompting his wife to call EMS.  Prior history of pneumonia on background of COPD from remote smoking. No recent travel, no sick contacts.  Past Medical History   Past Medical History:  Diagnosis Date   Alcohol abuse    Colon cancer ([place])    COPD (chronic obstructive pulmonary disease) ([place])    Emphysema of lung ([place])    Former tobacco use    Hypertension    Peripheral edema      Past Surgical History:  Procedure Laterality Date   CATARACT EXTRACTION     COLON RESECTION     COLONOSCOPY     MINOR HEMORRHOIDECTOMY  1995   TONSILLECTOMY       Review of Systems:   Review of Systems  Unable to perform ROS: Intubated    Social History   reports that he quit smoking about 37 years ago. His smoking use included cigarettes. He has a 20.00 pack-year smoking history. He has never used smokeless tobacco. He reports current alcohol use of about 7.0 standard drinks of alcohol per week. He reports that he does not use drugs.   Family History   His family history includes Brain cancer in his father; Colon cancer in his mother; Lung cancer in his brother.   Allergies Allergies  Allergen Reactions   Flexeril [Cyclobenzaprine] Other (See Comments)    Unknown.      Home Medications  Prior to Admission medications   Medication Sig Start Date End Date Taking? Authorizing Provider  ALLOPURINOL PO Take 20 mg by mouth.    [provider]  aspirin EC 81 MG tablet Take 81 mg by mouth every  morning.    [provider]  bisoprolol (ZEBETA) 5 MG tablet Take 5 mg by mouth daily.    [provider]  ferrous sulfate 325 (65 FE) MG tablet Take 325 mg by mouth daily with breakfast.    [provider]  furosemide (LASIX) 20 MG tablet Take 1 tablet (20 mg total) by mouth daily. 12/11/18 03/11/19  Lorretta Harp, MD  Multiple Vitamin (MULTIVITAMIN WITH MINERALS) TABS tablet Take 1 tablet by mouth daily.    [provider]  tiotropium (SPIRIVA) 18 MCG inhalation capsule Place 1 capsule (18 mcg total) into inhaler and inhale daily. Patient taking differently: Place 18 mcg into inhaler and inhale as directed.  05/10/13   Monika Salk, MD     Interim history/subjective:  On arrival in the ED he was in respiratory distress with a significantly altered mental status.  ABG showed respiratory acidosis for which he was intubated.  He received appropriate sepsis resuscitation with 3 L of fluid and broad-spectrum antibiotics targeting a community-acquired pneumonia.  Objective   Blood pressure 133/64, pulse 92, temperature (!) 100.8 F (38.2 C), temperature source Rectal, resp. rate 15, height 5\' 11"  (1.803 m), weight 117.9 kg, SpO2 98 %.    Vent Mode: PRVC FiO2 (%):  [100 %] 100 % Set Rate:  [15 bmp] 15 bmp Vt Set:  [600 mL] 600 mL PEEP:  [  [place] Pressure:  [16 cmH20] 16 cmH20   Intake/Output Summary (Last 24 hours) at 03/03/2019 1315 Last data filed at 03/03/2019 1202 Gross per 24 hour  Intake 3850 ml  Output --  Net 3850 ml   Filed Weights   03/03/19 0934  Weight: 117.9 kg    Examination: Physical Exam  Constitutional: He appears well-developed and well-nourished.  HENT:  Head: Normocephalic and atraumatic.  Mouth/Throat: Oropharyngeal exudate (Significant purulent oropharyngeal exudate) present.  Eyes: Conjunctivae are normal. No scleral icterus.  Neck: No JVD present. No tracheal deviation present.  Intubated with ET tube  and OG tube in place  Cardiovascular: Regular rhythm and normal heart sounds.  Respiratory: He has wheezes.  Bronchial breathing at left base  GI: Soft. He exhibits no distension.  Musculoskeletal:        General: No edema.  Neurological:  Sedated with neuromuscular blockade secondary to intubation  Skin: Skin is warm and dry.  Normal capillary refill     Ancillary tests (personally reviewed)  CBC: Recent Labs  Lab 03/03/19 0947 03/03/19 1028  WBC 29.4*  --   NEUTROABS 25.3*  --   HGB 12.7* 12.9*  HCT 42.2 38.0*  MCV 106.6*  --   PLT 229  --     Basic Metabolic Panel: Recent Labs  Lab 03/03/19 0947 03/03/19 1028  NA 138 139  K 5.6* 5.3*  CL 99  --   CO2 26  --   GLUCOSE 121*  --   BUN 75*  --   CREATININE 4.50*  --   CALCIUM 8.2*  --    GFR: Estimated Creatinine Clearance: 16.2 mL/min (A) (by C-G formula based on SCr of 4.5 mg/dL (H)). Recent Labs  Lab 03/03/19 0947  WBC 29.4*  LATICACIDVEN 1.6    Liver Function Tests: Recent Labs  Lab 03/03/19 0947  AST 24  ALT 10  ALKPHOS 75  BILITOT 0.6  PROT 7.0  ALBUMIN 2.5*   No results for input(s): LIPASE, AMYLASE in the last 168 hours. No results for input(s): AMMONIA in the last 168 hours.  ABG    Component Value Date/Time   HCO3 27.9 03/03/2019 1028   TCO2 30 03/03/2019 1028   ACIDBASEDEF 1.0 03/03/2019 1028   O2SAT 95.0 03/03/2019 1028     Coagulation Profile: No results for input(s): INR, PROTIME in the last 168 hours.  Cardiac Enzymes: No results for input(s): CKTOTAL, CKMB, CKMBINDEX, TROPONINI in the last 168 hours.  HbA1C: Hgb A1c MFr Bld  Date/Time Value Ref Range Status  11/05/2016 03:07 AM 5.2 4.8 - 5.6 % Final    Comment:    (NOTE)         Pre-diabetes: 5.7 - 6.4         Diabetes: >6.4         Glycemic control for adults with diabetes: <7.0     CBG: No results for input(s): GLUCAP in the last 168 hours.   Assessment & Plan:   Critically ill due to acute hypoxic  respiratory failure requiring mechanical ventilation Presentation most compatible with community-acquired bacterial pneumonia. No epidemiologic risk factors for coronavirus. Critically ill due to septic shock secondary to bacterial pneumonia requiring titration of norepinephrine to maintain adequate tissue perfusion. Appears clinically volume replete at this time. Acute kidney injury secondary to shock. Known COPD by history Hypertension  Plan:  Lung protective mechanical ventilation. Initiate weaning protocol once oxygenation goals achieved Continue to titrate norepinephrine to mean arterial pressure  greater than 65 Complete 30 cc/kg initial fluid challenge and follow urine output.  Continue maintenance fluids. Continue antibiotics for community-acquired pneumonia Follow respiratory cultures and consider additional testing for coronavirus if no organisms seen on Gram stain. Maintain droplet and contact isolation. Repeat renal panel later today.  Ensure patient not progressing to dialysis Bronchodilator therapy. Hold home antihypertensive medication.  Best practice:  Diet: N.p.o.  Initiate trickle tube feeds Pain/Anxiety/Delirium protocol (if indicated): Intermittent fentanyl Versed for sedation VAP protocol (if indicated): Bundle in place DVT prophylaxis: Unfractionated heparin GI prophylaxis: Famotidine Urinary catheter: Assessment of intravascular volume Glucose control: Phase 1 Mobility: Bedrest Code Status: Full code Family Communication: Wife updated at bedside Disposition: ICU   Critical care time: 40 min including chart data review, examination of patient, multidisciplinary rounds, and frequent assessment and modification of vasopressor, fluid, ventilator settings, sedation.Kipp Brood, MD [facility] ICU Physician [place]  Pager: [phone] Mobile: [phone] After hours: [phone].  03/03/2019, 1:15 PM

## 2019-03-03 NOTE — ED Provider Notes (Signed)
[facility] EMERGENCY DEPARTMENT Provider Note   CSN: [id number] Arrival date & time: 03/03/19  1740    History   Chief Complaint Chief Complaint  Patient presents with  . Blood Infection  . Altered Mental Status    HPI David Barton is a 85 y.o. male.  He is brought in by EMS after his wife called for altered mental status, fever, short of breath.  He was diagnosed with the flu despite a negative flu test by his PCP and is on Tamiflu.  He has been sick for a few days.  Per EMS the wife had called EMS last night for his being altered and she was concerned he was having a stroke.  He was not transported then.  Today she found him altered and febrile and called EMS.  Patient himself is not able to give any history and is confused.  Sats were low on arrival and he was placed on a nonrebreather.  He has a history of COPD.  Level 5 caveat secondary to altered mental status.     The history is provided by the patient.  Altered Mental Status  Presenting symptoms: confusion   Severity:  Severe Most recent episode:  Yesterday Episode history:  Single Timing:  Intermittent Progression:  Worsening Chronicity:  New Context: recent illness and recent infection     Past Medical History:  Diagnosis Date  . Alcohol abuse   . Colon cancer ([place])   . COPD (chronic obstructive pulmonary disease) ([place])   . Emphysema of lung ([place])   . Former tobacco use   . Hypertension   . Peripheral edema     Patient Active Problem List   Diagnosis Date Noted  . Chronic diastolic heart failure ([place]) 01/17/2019  . Edema of both legs 01/02/2019  . Pulmonary edema   . Hypomagnesemia 11/15/2016  . Sleep apnea   . Sepsis ([place]) 11/10/2016  . Elevated troponin 11/10/2016  . Acute systolic CHF (congestive heart failure) ([place]) 11/10/2016  . Cardiomyopathy ([place]) 11/10/2016  . PSVT (paroxysmal supraventricular tachycardia) ([place]) 11/10/2016  . Dysphagia   . Renal insufficiency   .  Enteritis due to Clostridium difficile   . Aspiration pneumonia ([place]) 11/04/2016  . Diarrhea 11/04/2016  . AKI (acute kidney injury) ([place]) 11/04/2016  . Hyperkalemia 11/04/2016  . Community acquired pneumonia of right lung 11/03/2016  . Essential hypertension 05/31/2013  . COPD  GOLD II with reversibility 05/29/2013  . Hypokalemia 05/08/2013  . Hypotension 05/08/2013  . Dizziness 05/08/2013  . Hypoxia 05/08/2013  . Acute respiratory failure ([place]) 05/08/2013    Past Surgical History:  Procedure Laterality Date  . CATARACT EXTRACTION    . COLON RESECTION    . COLONOSCOPY    . MINOR HEMORRHOIDECTOMY  1995  . TONSILLECTOMY          Home Medications    Prior to Admission medications   Medication Sig Start Date End Date Taking? Authorizing Provider  ALLOPURINOL PO Take 20 mg by mouth.    [provider]  aspirin EC 81 MG tablet Take 81 mg by mouth every morning.    [provider]  bisoprolol (ZEBETA) 5 MG tablet Take 5 mg by mouth daily.    [provider]  ferrous sulfate 325 (65 FE) MG tablet Take 325 mg by mouth daily with breakfast.    [provider]  furosemide (LASIX) 20 MG tablet Take 1 tablet (20 mg total) by mouth daily. 12/11/18 03/11/19  Gwenlyn Found,  Pearletha Forge, MD  Multiple Vitamin (MULTIVITAMIN WITH MINERALS) TABS tablet Take 1 tablet by mouth daily.    [provider]  tiotropium (SPIRIVA) 18 MCG inhalation capsule Place 1 capsule (18 mcg total) into inhaler and inhale daily. Patient taking differently: Place 18 mcg into inhaler and inhale as directed.  05/10/13   Monika Salk, MD    Family History Family History  Problem Relation Age of Onset  . Brain cancer Father   . Colon cancer Mother        with mets to liver  . Lung cancer Brother        was a smoker    Social History Social History   Tobacco Use  . Smoking status: Former Smoker    Packs/day: 1.00    Years: 20.00    Pack years: 20.00    Types: Cigarettes     Last attempt to quit: 12/20/1981    Years since quitting: 37.2  . Smokeless tobacco: Never Used  Substance Use Topics  . Alcohol use: Yes    Alcohol/week: 7.0 standard drinks    Types: 7 Standard drinks or equivalent per week    Comment: states he drinks 3-4 days a week, "a couple" on those occasions but does not further quaniity.  . Drug use: No     Allergies   Flexeril [cyclobenzaprine]   Review of Systems Review of Systems  Unable to perform ROS: Mental status change  Psychiatric/Behavioral: Positive for confusion.     Physical Exam Updated Vital Signs BP 133/64   Pulse 92   Temp (!) 100.8 F (38.2 C) (Rectal)   Resp 15   Ht 5\' 11"  (1.803 m)   Wt 117.9 kg   SpO2 98%   BMI 36.26 kg/m   Physical Exam Vitals signs and nursing note reviewed.  Constitutional:      Appearance: He is well-developed. He is ill-appearing.  HENT:     Head: Normocephalic and atraumatic.  Eyes:     Conjunctiva/sclera: Conjunctivae normal.  Neck:     Musculoskeletal: Neck supple.  Cardiovascular:     Rate and Rhythm: Regular rhythm. Tachycardia present.     Heart sounds: No murmur.  Pulmonary:     Effort: Pulmonary effort is normal. No respiratory distress.     Breath sounds: Rhonchi present.  Abdominal:     Palpations: Abdomen is soft.     Tenderness: There is no abdominal tenderness. There is no guarding or rebound.  Musculoskeletal: Normal range of motion.        General: No deformity or signs of injury.     Right lower leg: Edema present.     Left lower leg: Edema present.  Skin:    General: Skin is warm and dry.     Capillary Refill: Capillary refill takes less than 2 seconds.  Neurological:     Mental Status: He is alert. He is disoriented.     Comments: Patient is able to answer yes/no questions.  He is not following commands.      ED Treatments / Results  Labs (all labs ordered are listed, but only abnormal results are displayed) Labs Reviewed  LACTIC ACID,  PLASMA - Abnormal; Notable for the following components:      Result Value   Lactic Acid, Venous 2.2 (*)    All other components within normal limits  COMPREHENSIVE METABOLIC PANEL - Abnormal; Notable for the following components:   Potassium 5.6 (*)    Glucose, Bld 121 (*)  BUN 75 (*)    Creatinine, Ser 4.50 (*)    Calcium 8.2 (*)    Albumin 2.5 (*)    GFR calc non Af Amer 11 (*)    GFR calc Af Amer 13 (*)    All other components within normal limits  CBC WITH DIFFERENTIAL/PLATELET - Abnormal; Notable for the following components:   WBC 29.4 (*)    RBC 3.96 (*)    Hemoglobin 12.7 (*)    MCV 106.6 (*)    RDW 17.0 (*)    Neutro Abs 25.3 (*)    Lymphs Abs 0.3 (*)    Monocytes Absolute 3.8 (*)    All other components within normal limits  CBC - Abnormal; Notable for the following components:   WBC 29.6 (*)    RBC 3.84 (*)    Hemoglobin 12.6 (*)    MCV 107.6 (*)    RDW 17.0 (*)    All other components within normal limits  BASIC METABOLIC PANEL - Abnormal; Notable for the following components:   Potassium 5.7 (*)    Glucose, Bld 152 (*)    BUN 70 (*)    Creatinine, Ser 4.36 (*)    Calcium 7.1 (*)    GFR calc non Af Amer 12 (*)    GFR calc Af Amer 14 (*)    All other components within normal limits  POCT I-STAT EG7 - Abnormal; Notable for the following components:   pH, Ven 7.213 (*)    pCO2, Ven 69.3 (*)    pO2, Ven 97.0 (*)    Potassium 5.3 (*)    Calcium, Ion 1.02 (*)    HCT 38.0 (*)    Hemoglobin 12.9 (*)    All other components within normal limits  RESPIRATORY PANEL BY PCR  CULTURE, BLOOD (ROUTINE X 2)  CULTURE, BLOOD (ROUTINE X 2)  NOVEL CORONAVIRUS, NAA (HOSPITAL ORDER, SEND-OUT TO REF LAB)  CULTURE, RESPIRATORY  LACTIC ACID, PLASMA  INFLUENZA PANEL BY PCR (TYPE A & B)  URINALYSIS, ROUTINE W REFLEX MICROSCOPIC  BLOOD GAS, VENOUS    EKG EKG Interpretation  Date/Time:  Saturday March 03 2019 10:35:13 EDT Ventricular Rate:  144 PR Interval:    QRS  Duration: 143 QT Interval:  355 QTC Calculation: 550 R Axis:   161 Text Interpretation:  Extreme tachycardia with wide complex, no further rhythm analysis attempted vtach vs aberancy Confirmed by Aletta Edouard [phone]) on 03/03/2019 10:42:01 AM Also confirmed by Aletta Edouard [phone]), editor Philomena Doheny [phone])  on 03/03/2019 10:47:28 AM   Radiology Dg Chest Port 1 View  Result Date: 03/03/2019 CLINICAL DATA:  Intubated. EXAM: PORTABLE CHEST 1 VIEW COMPARISON:  Earlier same day FINDINGS: Endotracheal tube tip is 4 cm above the carina. Nasogastric tube enters the abdomen. There is abnormal density in both lower lobes consistent with atelectasis and or pneumonia. Possibility of acute edema does exist. IMPRESSION: New and probably worsening lower lung density which could be due to pneumonia or acute edema. Endotracheal tube and nasogastric tube satisfactory as seen. Electronically Signed   By: Nelson Chimes M.D.   On: 03/03/2019 12:46   Dg Chest Port 1 View  Result Date: 03/03/2019 CLINICAL DATA:  Fever, cough, shortness of breath EXAM: PORTABLE CHEST 1 VIEW COMPARISON:  11/17/2016 FINDINGS: Multifocal patchy opacities in the lingula, left lower lobe, and right middle lobe. This appearance suggests multifocal pneumonia. No pleural effusion or pneumothorax. Mild cardiomegaly. IMPRESSION: Multifocal patchy opacities, suggesting multifocal pneumonia. Electronically Signed   By:  Julian Hy M.D.   On: 03/03/2019 10:28   Dg Abd Portable 1 View  Result Date: 03/03/2019 CLINICAL DATA:  Orogastric placement EXAM: PORTABLE ABDOMEN - 1 VIEW COMPARISON:  None. FINDINGS: Orogastric tube enters the stomach with its tip in the midportion. IMPRESSION: Orogastric tube tip in the mid body of the stomach. Electronically Signed   By: Nelson Chimes M.D.   On: 03/03/2019 12:46    Procedures Procedure Name: Intubation Date/Time: 03/03/2019 11:22 AM Performed by: Hayden Rasmussen, MD Pre-anesthesia Checklist:  Patient identified, Patient being monitored, Emergency Drugs available, Timeout performed and Suction available Oxygen Delivery Method: Non-rebreather mask Preoxygenation: Pre-oxygenation with 100% oxygen Induction Type: Rapid sequence Ventilation: Mask ventilation without difficulty Laryngoscope Size: Glidescope and 4 Grade View: Grade III Number of attempts: 1 Airway Equipment and Method: Video-laryngoscopy Placement Confirmation: ETT inserted through vocal cords under direct vision,  CO2 detector and Breath sounds checked- equal and bilateral Secured at: 27 cm Tube secured with: ETT holder Dental Injury: Teeth and Oropharynx as per pre-operative assessment  Difficulty Due To: Difficulty was anticipated     .Critical Care Performed by: Hayden Rasmussen, MD Authorized by: Hayden Rasmussen, MD   Critical care provider statement:    Critical care time (minutes):  60   Critical care time was exclusive of:  Separately billable procedures and treating other patients   Critical care was necessary to treat or prevent imminent or life-threatening deterioration of the following conditions:  CNS failure or compromise, circulatory failure, sepsis and respiratory failure   Critical care was time spent personally by me on the following activities:  Discussions with consultants, evaluation of patient's response to treatment, examination of patient, ordering and performing treatments and interventions, ordering and review of laboratory studies, ordering and review of radiographic studies, pulse oximetry, re-evaluation of patient's condition, obtaining history from patient or surrogate, review of old charts, development of treatment plan with patient or surrogate, ventilator management and gastric intubation   I assumed direction of critical care for this patient from another provider in my specialty: no     (including critical care time)  Medications Ordered in ED Medications  cefTRIAXone  (ROCEPHIN) 2 g in sodium chloride 0.9 % 100 mL IVPB (0 g Intravenous Stopped 03/03/19 1126)  azithromycin (ZITHROMAX) 500 mg in sodium chloride 0.9 % 250 mL IVPB (0 mg Intravenous Stopped 03/03/19 1200)  norepinephrine (LEVOPHED) 4-5 MG/250ML-% infusion SOLN (has no administration in time range)  norepinephrine (LEVOPHED) 4mg  in 291mL premix infusion (0 mcg/min Intravenous Paused 03/03/19 1438)  heparin injection 5,000 Units (has no administration in time range)  0.9 %  sodium chloride infusion (has no administration in time range)  docusate sodium (COLACE) capsule 100 mg (100 mg Oral Not Given 03/03/19 1145)  famotidine (PEPCID) IVPB 20 mg in NS 100 mL IVPB (has no administration in time range)  propofol (DIPRIVAN) 1000 MG/100ML infusion (5 mcg/kg/min  117.9 kg Intravenous New Bag/Given 03/03/19 1404)  HYDROmorphone (DILAUDID) injection 2 mg (has no administration in time range)  ipratropium-albuterol (DUONEB) 0.5-2.5 (3) MG/3ML nebulizer solution 3 mL (has no administration in time range)  feeding supplement (VITAL HIGH PROTEIN) liquid 1,000 mL (has no administration in time range)  sodium chloride 0.9 % bolus 1,000 mL (0 mLs Intravenous Stopped 03/03/19 1044)    And  sodium chloride 0.9 % bolus 1,000 mL (0 mLs Intravenous Stopped 03/03/19 1100)    And  sodium chloride 0.9 % bolus 1,000 mL (0 mLs Intravenous Stopped 03/03/19 1200)  And  sodium chloride 0.9 % bolus 500 mL (0 mLs Intravenous Stopped 03/03/19 1202)  acetaminophen (TYLENOL) suppository 650 mg (650 mg Rectal Given 03/03/19 0956)  etomidate (AMIDATE) injection (15 mg Intravenous Given 03/03/19 1111)  rocuronium (ZEMURON) injection (100 mg Intravenous Given 03/03/19 1113)  norepinephrine (LEVOPHED) injection (5 mcg Intravenous Given 03/03/19 1116)     Initial Impression / Assessment and Plan / ED Course  I have reviewed the triage vital signs and the nursing notes.  Pertinent labs & imaging results that were available during my care of  the patient were reviewed by me and considered in my medical decision making (see chart for details).  Clinical Course as of Mar 02 1121  Sat Mar 03, 2019  0940 Wife is here.  She said he started with a fever on Wednesday and was tested by the PCP on Thursday and was flu negative.  Started on Tamiflu then.  Was doing better on Friday until last night when he was having shaking rigors and was altered.  EMS found her to be febrile and gave him some fluids and Tylenol with improvement in his symptoms.  She said he was not altered going to bed but found him that way again this morning.   [MB]  0940 He has been placed on contact and droplet precautions and moved to negative flow room.   [MB]  1005 Reevaluated patient.  He is now more aware of his surroundings and understands that he is at hospital.   [MB]  1013 Please to call into infection prevention.   [MB]  1022 Received a call back from Angie from infectious prevention.  She agreed with the current plan of negative flow if available and 95's and contact droplet precautions.  She will fill out the forms for testing.  She recommend that his wife if she is asymptomatic, follow herself for fever or respiratory symptoms.   [MB]  8938 Patient becoming tachycardic hypotensive and more confused.  VBG back showing CO2 retention.  Elevated white blood cell count.  Worsening creatinine.  Chest x-ray read as multifocal pneumonia.  Antibiotics ordered IV fluids infusing.  Page sent out for the critical care intensivist.  Wife updated.   [MB]  1017 Discussed with critical care hospitalist who will come down to evaluate the patient.  He is asking that we prepared to intubate the patient.  Patient's had a rhythm change in his intermittently going tachybradycardia.   [MB]    Clinical Course User Index [MB] Hayden Rasmussen, MD       Final Clinical Impressions(s) / ED Diagnoses   Final diagnoses:  None    ED Discharge Orders    None       Hayden Rasmussen, MD 03/03/19 1521

## 2019-03-03 NOTE — ED Notes (Signed)
Pt placed on Non rebreather mask for SpO2 of 82% No distress noted SPO2 now 86%

## 2019-03-03 NOTE — ED Notes (Signed)
Admitting called back, said for the rn to call him back at [phone] (dr.Agarwala)

## 2019-03-03 NOTE — ED Notes (Signed)
Dr.Butler made aware of critical results of Istat VBG. ED-Lab

## 2019-03-03 NOTE — Progress Notes (Signed)
eLink Physician-Brief Progress Note Patient Name: David Barton DOB: 1933/08/24 MRN: [id number]   Date of Service  03/03/2019  HPI/Events of Note  Informed of frequent PVCs. Last K 5.7 with no intervention  eICU Interventions  Ordered to repeat electrolytes stat     Intervention Category Major Interventions: Electrolyte abnormality - evaluation and management;Arrhythmia - evaluation and management  Judd Lien 03/03/2019, 10:38 PM

## 2019-03-04 ENCOUNTER — Inpatient Hospital Stay (HOSPITAL_COMMUNITY): Payer: Medicare Other

## 2019-03-04 DIAGNOSIS — J189 Pneumonia, unspecified organism: Secondary | ICD-10-CM

## 2019-03-04 DIAGNOSIS — J9601 Acute respiratory failure with hypoxia: Secondary | ICD-10-CM

## 2019-03-04 DIAGNOSIS — N179 Acute kidney failure, unspecified: Secondary | ICD-10-CM

## 2019-03-04 DIAGNOSIS — R6521 Severe sepsis with septic shock: Secondary | ICD-10-CM

## 2019-03-04 DIAGNOSIS — A419 Sepsis, unspecified organism: Secondary | ICD-10-CM

## 2019-03-04 LAB — BASIC METABOLIC PANEL
ANION GAP: 12 (ref 5–15)
Anion gap: 12 (ref 5–15)
BUN: 77 mg/dL — ABNORMAL HIGH (ref 8–23)
BUN: 80 mg/dL — ABNORMAL HIGH (ref 8–23)
CALCIUM: 7.1 mg/dL — AB (ref 8.9–10.3)
CO2: 21 mmol/L — ABNORMAL LOW (ref 22–32)
CO2: 23 mmol/L (ref 22–32)
Calcium: 7.2 mg/dL — ABNORMAL LOW (ref 8.9–10.3)
Chloride: 106 mmol/L (ref 98–111)
Chloride: 106 mmol/L (ref 98–111)
Creatinine, Ser: 4.31 mg/dL — ABNORMAL HIGH (ref 0.61–1.24)
Creatinine, Ser: 4.42 mg/dL — ABNORMAL HIGH (ref 0.61–1.24)
GFR calc Af Amer: 13 mL/min — ABNORMAL LOW (ref >60)
GFR calc Af Amer: 14 mL/min — ABNORMAL LOW (ref >60)
GFR calc non Af Amer: 11 mL/min — ABNORMAL LOW (ref >60)
GFR calc non Af Amer: 12 mL/min — ABNORMAL LOW (ref >60)
Glucose, Bld: 156 mg/dL — ABNORMAL HIGH (ref 70–99)
Glucose, Bld: 162 mg/dL — ABNORMAL HIGH (ref 70–99)
Potassium: 4.8 mmol/L (ref 3.5–5.1)
Potassium: 5.4 mmol/L — ABNORMAL HIGH (ref 3.5–5.1)
Sodium: 139 mmol/L (ref 135–145)
Sodium: 141 mmol/L (ref 135–145)

## 2019-03-04 LAB — GLUCOSE, CAPILLARY
GLUCOSE-CAPILLARY: 150 mg/dL — AB (ref 70–99)
Glucose-Capillary: 105 mg/dL — ABNORMAL HIGH (ref 70–99)
Glucose-Capillary: 107 mg/dL — ABNORMAL HIGH (ref 70–99)
Glucose-Capillary: 115 mg/dL — ABNORMAL HIGH (ref 70–99)
Glucose-Capillary: 137 mg/dL — ABNORMAL HIGH (ref 70–99)
Glucose-Capillary: 146 mg/dL — ABNORMAL HIGH (ref 70–99)

## 2019-03-04 LAB — CBC
HCT: 36.3 % — ABNORMAL LOW (ref 39.0–52.0)
Hemoglobin: 11.3 g/dL — ABNORMAL LOW (ref 13.0–17.0)
MCH: 33.2 pg (ref 26.0–34.0)
MCHC: 31.1 g/dL (ref 30.0–36.0)
MCV: 106.8 fL — ABNORMAL HIGH (ref 80.0–100.0)
PLATELETS: 232 10*3/uL (ref 150–400)
RBC: 3.4 MIL/uL — ABNORMAL LOW (ref 4.22–5.81)
RDW: 17 % — ABNORMAL HIGH (ref 11.5–15.5)
WBC: 25 10*3/uL — ABNORMAL HIGH (ref 4.0–10.5)
nRBC: 0.1 % (ref 0.0–0.2)

## 2019-03-04 LAB — CORTISOL-AM, BLOOD: Cortisol - AM: 20.2 ug/dL (ref 6.7–22.6)

## 2019-03-04 LAB — PROTIME-INR
INR: 1.2 (ref 0.8–1.2)
Prothrombin Time: 15.4 seconds — ABNORMAL HIGH (ref 11.4–15.2)

## 2019-03-04 MED ORDER — ORAL CARE MOUTH RINSE
15.0000 mL | Freq: Two times a day (BID) | OROMUCOSAL | Status: DC
  Administered 2019-03-04 – 2019-03-26 (×25): 15 mL via OROMUCOSAL

## 2019-03-04 MED ORDER — ORAL CARE MOUTH RINSE: 15.0000 mL | Freq: Two times a day (BID) | OROMUCOSAL | Status: DC

## 2019-03-04 NOTE — Progress Notes (Signed)
Notified Elink MD about low urinary output, 10cc in 3 hours and frequent PVCs. Lab ordered. Will continue to monitor.

## 2019-03-04 NOTE — Progress Notes (Signed)
This RN walked in pts room and pt had self extubated.  Immediately turned propofol off and applied oxygen.  Pt was A&Ox4 and did not appear to be in any distress.  Informed Respiratory and Dr Nelda Marseille who came to bedside.  Will continue to monitor closely.  Irven [place], RN

## 2019-03-04 NOTE — Procedures (Signed)
Extubation Procedure Note  Patient Details:   Name: David Barton DOB: 09-Sep-1933 MRN: [id number]   Airway Documentation:    Vent end date: 03/04/19 Vent end time: 1321   Evaluation  O2 sats: stable throughout Complications: No apparent complications Patient did tolerate procedure well. Bilateral Breath Sounds: Diminished   Yes  Pateint self extubated. Now on 6lpm HFNC SpO2 96. Dr Nelda Marseille notified.   David Barton 03/04/2019, 1:21 PM

## 2019-03-04 NOTE — Plan of Care (Signed)
  Problem: Activity: Goal: Ability to tolerate increased activity will improve Outcome: Progressing   Problem: Respiratory: Goal: Ability to maintain a clear airway and adequate ventilation will improve Outcome: Progressing   Problem: Role Relationship: Goal: Method of communication will improve Outcome: Progressing   Problem: Activity: Goal: Ability to tolerate increased activity will improve Outcome: Progressing   Problem: Clinical Measurements: Goal: Ability to maintain a body temperature in the normal range will improve Outcome: Progressing   Problem: Respiratory: Goal: Ability to maintain adequate ventilation will improve Outcome: Progressing Goal: Ability to maintain a clear airway will improve Outcome: Progressing

## 2019-03-04 NOTE — H&P (Signed)
NAME:  David Barton, MRN:  [id number], DOB:  October 09, 1933, LOS: 1 ADMISSION DATE:  03/03/2019, CONSULTATION DATE: 03/03/2019 REFERRING MD: [facility] ED, CHIEF COMPLAINT: Respiratory distress  HPI/course in hospital  85 year old man with a 4-day history of increasing shortness of breath with productive cough.  Flu swab negative at his physician's office on 3/12.  On Tamiflu empirically.  Developed fever and rigors 3/13 and was increasingly short of breath and altered this morning prompting his wife to call EMS.  Prior history of pneumonia on background of COPD from remote smoking. No recent travel, no sick contacts.  Interim history/subjective:  No events overnight, no new complaints  Objective   Blood pressure (!) 95/55, pulse 70, temperature 98.7 F (37.1 C), temperature source Oral, resp. rate 16, height 5\' 11"  (1.803 m), weight 108 kg, SpO2 91 %.    Vent Mode: PRVC FiO2 (%):  [50 %-100 %] 50 % Set Rate:  [15 bmp-18 bmp] 18 bmp Vt Set:  [600 mL] 600 mL PEEP:  [5 cmH20] 5 cmH20 Plateau Pressure:  [13 cmH20-16 cmH20] 14 cmH20   Intake/Output Summary (Last 24 hours) at 03/04/2019 1049 Last data filed at 03/04/2019 1000 Gross per 24 hour  Intake 5438.05 ml  Output 190 ml  Net 5248.05 ml   Filed Weights   03/03/19 0934 03/03/19 1600 03/04/19 0500  Weight: 117.9 kg 108 kg 108 kg    Examination: General: acute on chronically ill appearing male, on the vent, NAD HEENT: [place]/AT, PERRL, EOM-I and MMM Heart: RRR, Nl S1/S2 and -M/R/G. Lung: Coarse BS diffusely Abdomen: Soft, NT, ND and +BS Ext: -edema and -tenderness Skin: Intact  Ancillary tests (personally reviewed)  CBC: Recent Labs  Lab 03/03/19 0947 03/03/19 1028 03/03/19 1409 03/04/19 0329  WBC 29.4*  --  29.6* 25.0*  NEUTROABS 25.3*  --   --   --   HGB 12.7* 12.9* 12.6* 11.3*  HCT 42.2 38.0* 41.3 36.3*  MCV 106.6*  --  107.6* 106.8*  PLT 229  --  231 829    Basic Metabolic Panel: Recent Labs  Lab 03/03/19  0947 03/03/19 1028 03/03/19 1409 03/03/19 2337 03/04/19 0329  NA 138 139 137 139 141  K 5.6* 5.3* 5.7* 5.4* 4.8  CL 99  --  104 106 106  CO2 26  --  22 21* 23  GLUCOSE 121*  --  152* 156* 162*  BUN 75*  --  70* 77* 80*  CREATININE 4.50*  --  4.36* 4.31* 4.42*  CALCIUM 8.2*  --  7.1* 7.1* 7.2*   GFR: Estimated Creatinine Clearance: 15.8 mL/min (A) (by C-G formula based on SCr of 4.42 mg/dL (H)). Recent Labs  Lab 03/03/19 0947 03/03/19 1409 03/04/19 0329  WBC 29.4* 29.6* 25.0*  LATICACIDVEN 1.6 2.2*  --     Liver Function Tests: Recent Labs  Lab 03/03/19 0947  AST 24  ALT 10  ALKPHOS 75  BILITOT 0.6  PROT 7.0  ALBUMIN 2.5*   No results for input(s): LIPASE, AMYLASE in the last 168 hours. No results for input(s): AMMONIA in the last 168 hours.  ABG    Component Value Date/Time   PHART 7.347 (L) 03/03/2019 1818   PCO2ART 40.9 03/03/2019 1818   PO2ART 145 (H) 03/03/2019 1818   HCO3 21.8 03/03/2019 1818   TCO2 30 03/03/2019 1028   ACIDBASEDEF 2.9 (H) 03/03/2019 1818   O2SAT 98.7 03/03/2019 1818     Coagulation Profile: Recent Labs  Lab 03/04/19 0329  INR 1.2  Cardiac Enzymes: No results for input(s): CKTOTAL, CKMB, CKMBINDEX, TROPONINI in the last 168 hours.  HbA1C: Hgb A1c MFr Bld  Date/Time Value Ref Range Status  11/05/2016 03:07 AM 5.2 4.8 - 5.6 % Final    Comment:    (NOTE)         Pre-diabetes: 5.7 - 6.4         Diabetes: >6.4         Glycemic control for adults with diabetes: <7.0     CBG: Recent Labs  Lab 03/03/19 1612 03/03/19 1932 03/03/19 2344 03/04/19 0330 03/04/19 0729  GLUCAP 140* 138* 153* 150* 137*     Assessment & Plan:   Acute hypoxemic respiratory failure:  - Continue full vent support  - Adjust vent for ABG  - CXR and ABG in AM  - VAP prevention  - Increase PEEP to 10  CAP:  - Rocephin  - Zithromax  - F/u on cultures  Septic shock:  - Levophed  - IVF  - Treat infection  Acute encephalopathy:  -  Propofol as needed  - Minimize sedation as able  AKI:  - BMET in AM  - Hydrate  - Replace electrolytes as indicated  - Hydrate  - If UOP remains low in AM may need renal consult  - Renal U/S ordered  Best practice:  Diet: N.p.o.  Initiate trickle tube feeds Pain/Anxiety/Delirium protocol (if indicated): Intermittent fentanyl Versed for sedation VAP protocol (if indicated): Bundle in place DVT prophylaxis: Unfractionated heparin GI prophylaxis: Famotidine Urinary catheter: Assessment of intravascular volume Glucose control: Phase 1 Mobility: Bedrest Code Status: Full code Family Communication: Wife updated at bedside Disposition: ICU  The patient is critically ill with multiple organ systems failure and requires high complexity decision making for assessment and support, frequent evaluation and titration of therapies, application of advanced monitoring technologies and extensive interpretation of multiple databases.   Critical Care Time devoted to patient care services described in this note is  32  Minutes. This time reflects time of care of this signee Dr Jennet Maduro. This critical care time does not reflect procedure time, or teaching time or supervisory time of PA/NP/Med student/Med Resident etc but could involve care discussion time.  Rush Farmer, M.D. [facility] Pulmonary/Critical Care Medicine. Pager: [phone]. After hours pager: [phone].

## 2019-03-04 NOTE — Progress Notes (Signed)
Brief Nutrition Note  Consult received for enteral/tube feeding initiation and management.  Adult Enteral Nutrition Protocol initiated. Order currently in place for Vital High Protein @ 20 ml/hr which provides 480 kcal, 42 grams of protein, and 401 ml free water. Full assessment to follow.   Admitting Dx: sepsis  Body mass index is 33.21 kg/m. Pt meets criteria for obesity based on current BMI.  Labs:  Recent Labs  Lab 03/03/19 1409 03/03/19 2337 03/04/19 0329  NA 137 139 141  K 5.7* 5.4* 4.8  CL 104 106 106  CO2 22 21* 23  BUN 70* 77* 80*  CREATININE 4.36* 4.31* 4.42*  CALCIUM 7.1* 7.1* 7.2*  GLUCOSE 152* 156* 162*      Jarome Matin, MS, RD, LDN, [facility] Inpatient Clinical Dietitian Pager # [phone] After hours/weekend pager # [phone]

## 2019-03-05 ENCOUNTER — Inpatient Hospital Stay (HOSPITAL_COMMUNITY): Payer: Medicare Other

## 2019-03-05 DIAGNOSIS — J90 Pleural effusion, not elsewhere classified: Secondary | ICD-10-CM

## 2019-03-05 DIAGNOSIS — E8770 Fluid overload, unspecified: Secondary | ICD-10-CM

## 2019-03-05 LAB — BASIC METABOLIC PANEL
ANION GAP: 9 (ref 5–15)
BUN: 82 mg/dL — ABNORMAL HIGH (ref 8–23)
CO2: 23 mmol/L (ref 22–32)
Calcium: 7.5 mg/dL — ABNORMAL LOW (ref 8.9–10.3)
Chloride: 111 mmol/L (ref 98–111)
Creatinine, Ser: 3.62 mg/dL — ABNORMAL HIGH (ref 0.61–1.24)
GFR calc Af Amer: 17 mL/min — ABNORMAL LOW (ref >60)
GFR calc non Af Amer: 15 mL/min — ABNORMAL LOW (ref >60)
Glucose, Bld: 130 mg/dL — ABNORMAL HIGH (ref 70–99)
Potassium: 5.3 mmol/L — ABNORMAL HIGH (ref 3.5–5.1)
Sodium: 143 mmol/L (ref 135–145)

## 2019-03-05 LAB — POCT I-STAT 7, (LYTES, BLD GAS, ICA,H+H)
Acid-base deficit: 3 mmol/L — ABNORMAL HIGH (ref 0.0–2.0)
Bicarbonate: 25.5 mmol/L (ref 20.0–28.0)
Calcium, Ion: 1.07 mmol/L — ABNORMAL LOW (ref 1.15–1.40)
HCT: 35 % — ABNORMAL LOW (ref 39.0–52.0)
Hemoglobin: 11.9 g/dL — ABNORMAL LOW (ref 13.0–17.0)
O2 Saturation: 81 %
PO2 ART: 52 mmHg — AB (ref 83.0–108.0)
Patient temperature: 97.7
Potassium: 4.8 mmol/L (ref 3.5–5.1)
Sodium: 142 mmol/L (ref 135–145)
TCO2: 27 mmol/L (ref 22–32)
pCO2 arterial: 57.3 mmHg — ABNORMAL HIGH (ref 32.0–48.0)
pH, Arterial: 7.253 — ABNORMAL LOW (ref 7.350–7.450)

## 2019-03-05 LAB — CBC
HCT: 38 % — ABNORMAL LOW (ref 39.0–52.0)
Hemoglobin: 11.6 g/dL — ABNORMAL LOW (ref 13.0–17.0)
MCH: 32.7 pg (ref 26.0–34.0)
MCHC: 30.5 g/dL (ref 30.0–36.0)
MCV: 107 fL — ABNORMAL HIGH (ref 80.0–100.0)
NRBC: 0.2 % (ref 0.0–0.2)
Platelets: 186 10*3/uL (ref 150–400)
RBC: 3.55 MIL/uL — ABNORMAL LOW (ref 4.22–5.81)
RDW: 17 % — ABNORMAL HIGH (ref 11.5–15.5)
WBC: 13.1 10*3/uL — AB (ref 4.0–10.5)

## 2019-03-05 LAB — GLUCOSE, CAPILLARY
GLUCOSE-CAPILLARY: 115 mg/dL — AB (ref 70–99)
Glucose-Capillary: 110 mg/dL — ABNORMAL HIGH (ref 70–99)
Glucose-Capillary: 111 mg/dL — ABNORMAL HIGH (ref 70–99)
Glucose-Capillary: 120 mg/dL — ABNORMAL HIGH (ref 70–99)
Glucose-Capillary: 124 mg/dL — ABNORMAL HIGH (ref 70–99)
Glucose-Capillary: 137 mg/dL — ABNORMAL HIGH (ref 70–99)

## 2019-03-05 LAB — TRIGLYCERIDES: Triglycerides: 100 mg/dL (ref <150)

## 2019-03-05 LAB — PHOSPHORUS: Phosphorus: 5.4 mg/dL — ABNORMAL HIGH (ref 2.5–4.6)

## 2019-03-05 LAB — MAGNESIUM: MAGNESIUM: 2 mg/dL (ref 1.7–2.4)

## 2019-03-05 MED ORDER — METOPROLOL TARTRATE 5 MG/5ML IV SOLN
INTRAVENOUS | Status: AC
  Filled 2019-03-05: qty 5

## 2019-03-05 MED ORDER — ENSURE ENLIVE PO LIQD
237.0000 mL | Freq: Two times a day (BID) | ORAL | Status: DC
  Administered 2019-03-05 – 2019-03-26 (×28): 237 mL via ORAL
  Filled 2019-03-05 (×2): qty 237

## 2019-03-05 MED ORDER — BUDESONIDE 0.25 MG/2ML IN SUSP
0.2500 mg | Freq: Two times a day (BID) | RESPIRATORY_TRACT | Status: DC
  Administered 2019-03-05 – 2019-03-09 (×9): 0.25 mg via RESPIRATORY_TRACT
  Filled 2019-03-05 (×9): qty 2

## 2019-03-05 MED ORDER — METOPROLOL TARTRATE 5 MG/5ML IV SOLN
5.0000 mg | INTRAVENOUS | Status: AC | PRN
  Administered 2019-03-05 (×2): 5 mg via INTRAVENOUS
  Filled 2019-03-05: qty 5

## 2019-03-05 MED ORDER — ARFORMOTEROL TARTRATE 15 MCG/2ML IN NEBU
15.0000 ug | INHALATION_SOLUTION | Freq: Two times a day (BID) | RESPIRATORY_TRACT | Status: DC
  Administered 2019-03-05 – 2019-03-26 (×42): 15 ug via RESPIRATORY_TRACT
  Filled 2019-03-05 (×45): qty 2

## 2019-03-05 MED ORDER — FUROSEMIDE 10 MG/ML IJ SOLN
40.0000 mg | Freq: Once | INTRAMUSCULAR | Status: AC
  Administered 2019-03-05: 40 mg via INTRAVENOUS
  Filled 2019-03-05: qty 4

## 2019-03-05 MED ORDER — METOPROLOL TARTRATE 12.5 MG HALF TABLET
12.5000 mg | ORAL_TABLET | Freq: Three times a day (TID) | ORAL | Status: DC
  Administered 2019-03-05 – 2019-03-06 (×4): 12.5 mg via ORAL
  Filled 2019-03-05 (×5): qty 1

## 2019-03-05 MED ORDER — UMECLIDINIUM BROMIDE 62.5 MCG/INH IN AEPB: 1.0000 | INHALATION_SPRAY | Freq: Every day | RESPIRATORY_TRACT | Status: DC

## 2019-03-05 NOTE — Progress Notes (Signed)
Initial Nutrition Assessment  DOCUMENTATION CODES:   Obesity unspecified  INTERVENTION:   Ensure Enlive po BID, each supplement provides 350 kcal and 20 grams of protein  NUTRITION DIAGNOSIS:   Inadequate oral intake related to poor appetite as evidenced by per patient/family report.  GOAL:   Patient will meet greater than or equal to 90% of their needs  MONITOR:   PO intake, Supplement acceptance, Labs, Weight trends  REASON FOR ASSESSMENT:   Consult Assessment of nutrition requirement/status, Enteral/tube feeding initiation and management  ASSESSMENT:   85 yo male admitted with acute respiratory failure related to CAP requiring brief intubation, septic shock, acute encephalopathy, ARF. PMH includes COPD, HTN, EtOH abuse, colon cancer, former tobacco abuse  3/15  Extubated  Diet advanced to Dysphagia I. RN reports pt tolerating po but appetite is fair. Pt drinking fluids well. Plan to order Ensure Enlive  No weight loss per weight encounters  Labs: potassium 5.3 (H), phosphorus 5.4 (H), Creatinine 3.62, BUN 82 Meds: reviewed  NUTRITION - FOCUSED PHYSICAL EXAM:  Unable to assess  Diet Order:   Diet Order            DIET - DYS 1 Room service appropriate? Yes; Fluid consistency: Thin  Diet effective now              EDUCATION NEEDS:   Not appropriate for education at this time  Skin:  Skin Assessment: Reviewed RN Assessment  Last BM:  3/15  Height:   Ht Readings from Last 1 Encounters:  03/03/19 5\' 11"  (1.803 m)    Weight:   Wt Readings from Last 1 Encounters:  03/05/19 109.1 kg    Ideal Body Weight:  78.2 kg  BMI:  Body mass index is 33.55 kg/m.  Estimated Nutritional Needs:   Kcal:  8329-1916 kcals   Protein:  95-110 g   Fluid:  >/= 1.8 L   Kerman Passey MS, RD, LDN, CNSC [phone] Pager  [phone] Weekend/On-Call Pager

## 2019-03-05 NOTE — Progress Notes (Signed)
eLink Physician-Brief Progress Note Patient Name: David Barton DOB: 12-16-1933 MRN: [id number]   Date of Service  03/05/2019  HPI/Events of Note  SVT  130s to 150s BP 103/68 Patient asymptomatic K 4.8  eICU Interventions  Metoprolol 5 mg IV ordered prn for HR > 130 EKG pending     Intervention Category Major Interventions: Arrhythmia - evaluation and management  Judd Lien 03/05/2019, 4:19 AM

## 2019-03-05 NOTE — Progress Notes (Addendum)
Patient had 20 second run of SVT 0354

## 2019-03-05 NOTE — Progress Notes (Addendum)
NAME:  David Barton, MRN:  [id number], DOB:  23-Jan-1933, LOS: 2 ADMISSION DATE:  03/03/2019, CONSULTATION DATE: 03/03/2019 REFERRING MD: [facility] ED, CHIEF COMPLAINT: Respiratory distress  HPI/course in hospital  85 year old man with a 4-day history of increasing shortness of breath with productive cough.  Flu swab negative at his physician's office on 3/12.  On Tamiflu empirically.  Developed fever and rigors 3/13 and was increasingly short of breath and altered this morning prompting his wife to call EMS. Prior history of pneumonia on background of COPD from remote smoking. No recent travel, no sick contacts.  Interim history/subjective:  Self extubated yesterday afternoon. Doing well. Today off pressors since yesterday afternoon. No issues overnight.   Objective   Blood pressure 97/64, pulse (!) 125, temperature 98.2 F (36.8 C), temperature source Oral, resp. rate 18, height 5\' 11"  (1.803 m), weight 109.1 kg, SpO2 97 %.    Vent Mode: CPAP;PSV FiO2 (%):  [40 %] 40 % PEEP:  [5 cmH20] 5 cmH20 Pressure Support:  [10 cmH20] 10 cmH20 Plateau Pressure:  [15 cmH20] 15 cmH20   Intake/Output Summary (Last 24 hours) at 03/05/2019 0846 Last data filed at 03/05/2019 0600 Gross per 24 hour  Intake 1455.6 ml  Output 600 ml  Net 855.6 ml   Filed Weights   03/03/19 1600 03/04/19 0500 03/05/19 0414  Weight: 108 kg 108 kg 109.1 kg    Examination: General: Elderly male, resting in bad, no apparent distress  HEENT: NCAT, sclera clear Heart: irregularly irregular, resting in bed Lung: crackles in the BL bases  Abdomen: soft, nt, mildly distended  Ext: BL UE and LE dependent edema  Neuro: AAOX3 following commands, no deficit   Ancillary tests (personally reviewed)  CBC: Recent Labs  Lab 03/03/19 0947 03/03/19 1028 03/03/19 1409 03/04/19 0329 03/05/19 0317 03/05/19 0539  WBC 29.4*  --  29.6* 25.0*  --  13.1*  NEUTROABS 25.3*  --   --   --   --   --   HGB 12.7* 12.9* 12.6* 11.3*  11.9* 11.6*  HCT 42.2 38.0* 41.3 36.3* 35.0* 38.0*  MCV 106.6*  --  107.6* 106.8*  --  107.0*  PLT 229  --  231 232  --  694    Basic Metabolic Panel: Recent Labs  Lab 03/03/19 0947  03/03/19 1409 03/03/19 2337 03/04/19 0329 03/05/19 0317 03/05/19 0539  NA 138   < > 137 139 141 142 143  K 5.6*   < > 5.7* 5.4* 4.8 4.8 5.3*  CL 99  --  104 106 106  --  111  CO2 26  --  22 21* 23  --  23  GLUCOSE 121*  --  152* 156* 162*  --  130*  BUN 75*  --  70* 77* 80*  --  82*  CREATININE 4.50*  --  4.36* 4.31* 4.42*  --  3.62*  CALCIUM 8.2*  --  7.1* 7.1* 7.2*  --  7.5*  MG  --   --   --   --   --   --  2.0  PHOS  --   --   --   --   --   --  5.4*   < > = values in this interval not displayed.   GFR: Estimated Creatinine Clearance: 19.4 mL/min (A) (by C-G formula based on SCr of 3.62 mg/dL (H)). Recent Labs  Lab 03/03/19 0947 03/03/19 1409 03/04/19 0329 03/05/19 0539  WBC 29.4* 29.6* 25.0* 13.1*  LATICACIDVEN 1.6 2.2*  --   --     Liver Function Tests: Recent Labs  Lab 03/03/19 0947  AST 24  ALT 10  ALKPHOS 75  BILITOT 0.6  PROT 7.0  ALBUMIN 2.5*   No results for input(s): LIPASE, AMYLASE in the last 168 hours. No results for input(s): AMMONIA in the last 168 hours.  ABG    Component Value Date/Time   PHART 7.253 (L) 03/05/2019 0317   PCO2ART 57.3 (H) 03/05/2019 0317   PO2ART 52.0 (L) 03/05/2019 0317   HCO3 25.5 03/05/2019 0317   TCO2 27 03/05/2019 0317   ACIDBASEDEF 3.0 (H) 03/05/2019 0317   O2SAT 81.0 03/05/2019 0317     Coagulation Profile: Recent Labs  Lab 03/04/19 0329  INR 1.2    Cardiac Enzymes: No results for input(s): CKTOTAL, CKMB, CKMBINDEX, TROPONINI in the last 168 hours.  HbA1C: Hgb A1c MFr Bld  Date/Time Value Ref Range Status  11/05/2016 03:07 AM 5.2 4.8 - 5.6 % Final    Comment:    (NOTE)         Pre-diabetes: 5.7 - 6.4         Diabetes: >6.4         Glycemic control for adults with diabetes: <7.0     CBG: Recent Labs  Lab  03/04/19 1604 03/04/19 1959 03/04/19 2345 03/05/19 0316 03/05/19 0737  GLUCAP 115* 105* 107* 111* 110*    CXR:  Bibasilar atelectasis, bilateral effusions The patient's images have been independently reviewed by me.     Assessment & Plan:   Acute hypoxemic respiratory failure: CAP, sputum with GPC in pairs, pending:  - continue ceftriaxone and azithromycin  - IS as tolerated   - wean FIO2 to maintain sats >90%   - RVAP negative, Nov-CoV - pending (this was ordered in the ED and ID stated did not need to be on precautions as patient has no risk factors)   Septic shock, resolved   - Levophed off since yesterday afternoon  Acute encephalopathy, secondary to above   - likely related to infection and shock state as above  - will observe  - continue ICU delirium precautions   Acute renal failure 2/2 to sepsis   - improving  - will continue to follow UOP  Atrial fibrillation   - secondary to above, sepsis   - will add low dose rate control   - metoprolol 12.5mg  Q8H   Moderate COPD  - will start long acting nebs with PRN albuterol   Best practice:  Diet: advance as tolerated  Pain/Anxiety/Delirium protocol (if indicated): Intermittent fentanyl Versed for sedation VAP protocol (if indicated): Bundle in place DVT prophylaxis: Unfractionated heparin GI prophylaxis: stopped Urinary catheter: Assessment of intravascular volume Glucose control: Phase 1 Mobility: Bedrest Code Status: Full code Family Communication: no one at bedside  Disposition: ICU  Garner Nash, DO [facility] 03/05/2019 8:46 AM  Personal pager: [phone] If unanswered, please page CCM On-call: [phone]

## 2019-03-05 NOTE — Progress Notes (Signed)
Patient sustaining what appears to be SVT. Rate 130's-150's. Elink paged Patient asymptomatic EKG performed. Showed afib with RVR with PVC's Metoprolol 5mg  given  Will continue to monitor.

## 2019-03-06 DIAGNOSIS — A403 Sepsis due to Streptococcus pneumoniae: Principal | ICD-10-CM

## 2019-03-06 DIAGNOSIS — N178 Other acute kidney failure: Secondary | ICD-10-CM

## 2019-03-06 LAB — CULTURE, RESPIRATORY W GRAM STAIN

## 2019-03-06 LAB — GLUCOSE, CAPILLARY
GLUCOSE-CAPILLARY: 114 mg/dL — AB (ref 70–99)
Glucose-Capillary: 119 mg/dL — ABNORMAL HIGH (ref 70–99)
Glucose-Capillary: 149 mg/dL — ABNORMAL HIGH (ref 70–99)
Glucose-Capillary: 122 mg/dL — ABNORMAL HIGH (ref 70–99)
Glucose-Capillary: 106 mg/dL — ABNORMAL HIGH (ref 70–99)

## 2019-03-06 LAB — NOVEL CORONAVIRUS, NAA (HOSPITAL ORDER, SEND-OUT TO REF LAB): SARS-COV-2, NAA: NOT DETECTED

## 2019-03-06 LAB — NOVEL CORONAVIRUS, NAA (HOSP ORDER, SEND-OUT TO REF LAB; TAT 18-24 HRS)

## 2019-03-06 LAB — CULTURE, RESPIRATORY

## 2019-03-06 MED ORDER — DILTIAZEM HCL-DEXTROSE 100-5 MG/100ML-% IV SOLN (PREMIX)
5.0000 mg/h | INTRAVENOUS | Status: DC
  Administered 2019-03-06 – 2019-03-07 (×2): 5 mg/h via INTRAVENOUS
  Filled 2019-03-06 (×2): qty 100

## 2019-03-06 MED ORDER — DILTIAZEM LOAD VIA INFUSION
20.0000 mg | Freq: Once | INTRAVENOUS | Status: AC
  Administered 2019-03-06: 20 mg via INTRAVENOUS
  Filled 2019-03-06: qty 20

## 2019-03-06 MED ORDER — LEVALBUTEROL HCL 0.63 MG/3ML IN NEBU
0.6300 mg | INHALATION_SOLUTION | Freq: Four times a day (QID) | RESPIRATORY_TRACT | Status: DC
  Administered 2019-03-06 – 2019-03-07 (×3): 0.63 mg via RESPIRATORY_TRACT
  Filled 2019-03-06 (×3): qty 3

## 2019-03-06 MED ORDER — IPRATROPIUM BROMIDE 0.02 % IN SOLN
0.5000 mg | Freq: Four times a day (QID) | RESPIRATORY_TRACT | Status: DC
  Administered 2019-03-06 – 2019-03-07 (×3): 0.5 mg via RESPIRATORY_TRACT
  Filled 2019-03-06 (×3): qty 2.5

## 2019-03-06 MED ORDER — SODIUM CHLORIDE 0.9 % IV SOLN
INTRAVENOUS | Status: DC
  Administered 2019-03-06 – 2019-03-08 (×5): via INTRAVENOUS

## 2019-03-06 NOTE — Progress Notes (Signed)
[place] TEAM 1 - Stepdown/ICU TEAM  David Barton  JIR:[id number] DOB: 06/02/1933 DOA: 03/03/2019 PCP: Maury Dus, MD    Brief Narrative:  85 year old man with a 4-day history of increasing shortness of breath with productive cough.  Flu swab negative at his physician's office on 3/12.  On Tamiflu empirically.  Developed fever and rigors 3/13 and was increasingly short of breath and altered this morning prompting his wife to call EMS. Prior history of pneumonia on background of COPD from remote smoking. No recent travel, no sick contacts.  Significant Events: 3/14 admit - intubated  3/15 extubated  3/17 [facility] assumed care   Subjective: Reports that he is feeling better. Denies cp, n/v, or abdom pain. Less SOB. Reports poor appetite.   Assessment & Plan:  Acute hypoxemic respiratory failure - Strep pneumoniae Pneumonia  continue ceftriaxone and azithromycin - RVP/Influenza negative - Nov-CoV pending (felt to be low risk) - still requiring HFNC support - wean O2 as able   Septic shock resolved   Acute encephalopathy secondary to above - much improved   Acute renal failure Due to sepsis - crt appears to be slowly improving - keep hydrated   Newly diagnosed Atrial fibrillation w/ RVR As this is persisting will initiate full anticoag for now - adjust meds as HR poorly controlled - check TSH - Mg is normal   Moderate COPD No acute exacerbation at this time   Macrocytosis  Check B12 and folate   Obesity - Body mass index is 33.85 kg/m.   DVT prophylaxis: SQ heparin  Code Status: DNR - NO CODE Family Communication: no family present at time of exam  Disposition Plan: SDU  Consultants:  PCCM  Antimicrobials:  Azithromycin 3/14 > Rocephin 3/14 >   Objective: Blood pressure 113/86, pulse (!) 127, temperature 98.1 F (36.7 C), temperature source Oral, resp. rate (!) 21, height 5\' 11"  (1.803 m), weight 110.1 kg, SpO2 92 %.  Intake/Output Summary (Last 24 hours)  at 03/06/2019 1357 Last data filed at 03/06/2019 1300 Gross per 24 hour  Intake 651.2 ml  Output 1701 ml  Net -1049.8 ml   Filed Weights   03/04/19 0500 03/05/19 0414 03/06/19 0500  Weight: 108 kg 109.1 kg 110.1 kg    Examination: General: No acute respiratory distress Lungs: bibasilar crackles - no wheezing  Cardiovascular: irreg irreg - no M or rub  Abdomen: Nontender, nondistended, soft, bowel sounds positive, no rebound, no ascites, no appreciable mass Extremities: No significant cyanosis, clubbing, or edema bilateral lower extremities  CBC: Recent Labs  Lab 03/03/19 0947  03/03/19 1409 03/04/19 0329 03/05/19 0317 03/05/19 0539  WBC 29.4*  --  29.6* 25.0*  --  13.1*  NEUTROABS 25.3*  --   --   --   --   --   HGB 12.7*   < > 12.6* 11.3* 11.9* 11.6*  HCT 42.2   < > 41.3 36.3* 35.0* 38.0*  MCV 106.6*  --  107.6* 106.8*  --  107.0*  PLT 229  --  231 232  --  186   < > = values in this interval not displayed.   Basic Metabolic Panel: Recent Labs  Lab 03/03/19 2337 03/04/19 0329 03/05/19 0317 03/05/19 0539  NA 139 141 142 143  K 5.4* 4.8 4.8 5.3*  CL 106 106  --  111  CO2 21* 23  --  23  GLUCOSE 156* 162*  --  130*  BUN 77* 80*  --  82*  CREATININE 4.31* 4.42*  --  3.62*  CALCIUM 7.1* 7.2*  --  7.5*  MG  --   --   --  2.0  PHOS  --   --   --  5.4*   GFR: Estimated Creatinine Clearance: 19.5 mL/min (A) (by C-G formula based on SCr of 3.62 mg/dL (H)).  Liver Function Tests: Recent Labs  Lab 03/03/19 0947  AST 24  ALT 10  ALKPHOS 75  BILITOT 0.6  PROT 7.0  ALBUMIN 2.5*    Coagulation Profile: Recent Labs  Lab 03/04/19 0329  INR 1.2    HbA1C: Hgb A1c MFr Bld  Date/Time Value Ref Range Status  11/05/2016 03:07 AM 5.2 4.8 - 5.6 % Final    Comment:    (NOTE)         Pre-diabetes: 5.7 - 6.4         Diabetes: >6.4         Glycemic control for adults with diabetes: <7.0     CBG: Recent Labs  Lab 03/05/19 2026 03/05/19 2331 03/06/19 0310  03/06/19 0828 03/06/19 1136  GLUCAP 120* 124* 119* 149* 114*    Recent Results (from the past 240 hour(s))  Respiratory Panel by PCR     Status: None   Collection Time: 03/03/19  9:23 AM  Result Value Ref Range Status   Adenovirus NOT DETECTED NOT DETECTED Final   Coronavirus 229E NOT DETECTED NOT DETECTED Final    Comment: (NOTE) The Coronavirus on the Respiratory Panel, DOES NOT test for the novel  Coronavirus (2019 nCoV)    Coronavirus HKU1 NOT DETECTED NOT DETECTED Final   Coronavirus NL63 NOT DETECTED NOT DETECTED Final   Coronavirus OC43 NOT DETECTED NOT DETECTED Final   Metapneumovirus NOT DETECTED NOT DETECTED Final   Rhinovirus / Enterovirus NOT DETECTED NOT DETECTED Final   Influenza A NOT DETECTED NOT DETECTED Final   Influenza B NOT DETECTED NOT DETECTED Final   Parainfluenza Virus 1 NOT DETECTED NOT DETECTED Final   Parainfluenza Virus 2 NOT DETECTED NOT DETECTED Final   Parainfluenza Virus 3 NOT DETECTED NOT DETECTED Final   Parainfluenza Virus 4 NOT DETECTED NOT DETECTED Final   Respiratory Syncytial Virus NOT DETECTED NOT DETECTED Final   Bordetella pertussis NOT DETECTED NOT DETECTED Final   Chlamydophila pneumoniae NOT DETECTED NOT DETECTED Final   Mycoplasma pneumoniae NOT DETECTED NOT DETECTED Final    Comment: Performed at [facility], [place] [address], [place], [place] [address]  Blood Culture (routine x 2)     Status: None (Preliminary result)   Collection Time: 03/03/19  9:47 AM  Result Value Ref Range Status   Specimen Description BLOOD RIGHT HAND  Final   Special Requests   Final    BOTTLES DRAWN AEROBIC AND ANAEROBIC Blood Culture adequate volume   Culture   Final    NO GROWTH 3 DAYS Performed at [facility], [place] [address] [place], [place] [address]    Report Status PENDING  Incomplete  Blood Culture (routine x 2)     Status: None (Preliminary result)   Collection Time: 03/03/19 10:38 AM  Result Value Ref Range Status   Specimen  Description BLOOD LEFT HAND  Final   Special Requests AEROBIC BOTTLE ONLY Blood Culture adequate volume  Final   Culture   Final    NO GROWTH 3 DAYS Performed at [facility], [place] [address] [place],  [address]    Report Status PENDING  Incomplete  MRSA PCR Screening     Status: None   Collection Time: 03/03/19  3:59 PM  Result Value Ref Range Status   MRSA by PCR NEGATIVE NEGATIVE Final    Comment:        The GeneXpert MRSA Assay (FDA approved for NASAL specimens only), is one component of a comprehensive MRSA colonization surveillance program. It is not intended to diagnose MRSA infection nor to guide or monitor treatment for MRSA infections. Performed at [facility], [place] [address] [place], [place] [address]   Culture, respiratory (non-expectorated)     Status: None   Collection Time: 03/03/19  6:38 PM  Result Value Ref Range Status   Specimen Description TRACHEAL ASPIRATE  Final   Special Requests NONE  Final   Gram Stain   Final    RARE WBC PRESENT, PREDOMINANTLY PMN FEW GRAM POSITIVE COCCI IN PAIRS Performed at [facility], [place] [address] [place], [place] [address]    Culture FEW STREPTOCOCCUS PNEUMONIAE  Final   Report Status 03/06/2019 FINAL  Final   Organism ID, Bacteria STREPTOCOCCUS PNEUMONIAE  Final      Susceptibility   Streptococcus pneumoniae - MIC*    ERYTHROMYCIN >=8 RESISTANT Resistant     LEVOFLOXACIN 1 SENSITIVE Sensitive     VANCOMYCIN 0.5 SENSITIVE Sensitive     PENICILLIN (non-meningitis) 1 SENSITIVE Sensitive     CEFTRIAXONE (non-meningitis) 1 SENSITIVE Sensitive     * FEW STREPTOCOCCUS PNEUMONIAE     Scheduled Meds: . arformoterol  15 mcg Nebulization BID  . budesonide (PULMICORT) nebulizer solution  0.25 mg Nebulization BID  . docusate sodium  100 mg Oral BID  . feeding supplement (ENSURE ENLIVE)  237 mL Oral BID BM  . heparin  5,000 Units Subcutaneous Q8H  . ipratropium-albuterol  3 mL Nebulization Q6H  .  mouth rinse  15 mL Mouth Rinse BID  . metoprolol tartrate  12.5 mg Oral Q8H   Continuous Infusions: . sodium chloride Stopped (03/06/19 1245)  . azithromycin Stopped (03/06/19 1255)  . cefTRIAXone (ROCEPHIN)  IV Stopped (03/06/19 1125)     LOS: 3 days   Cherene Altes, MD [facility] Office  [phone] Pager - Text Page per Amion  If 7PM-7AM, please contact night-coverage per Amion 03/06/2019, 1:57 PM

## 2019-03-07 ENCOUNTER — Inpatient Hospital Stay (HOSPITAL_COMMUNITY): Payer: Medicare Other

## 2019-03-07 DIAGNOSIS — I4891 Unspecified atrial fibrillation: Secondary | ICD-10-CM

## 2019-03-07 LAB — BASIC METABOLIC PANEL
Anion gap: 5 (ref 5–15)
BUN: 62 mg/dL — ABNORMAL HIGH (ref 8–23)
CALCIUM: 8.5 mg/dL — AB (ref 8.9–10.3)
CO2: 26 mmol/L (ref 22–32)
Chloride: 113 mmol/L — ABNORMAL HIGH (ref 98–111)
Creatinine, Ser: 2.3 mg/dL — ABNORMAL HIGH (ref 0.61–1.24)
GFR calc Af Amer: 29 mL/min — ABNORMAL LOW (ref >60)
GFR, EST NON AFRICAN AMERICAN: 25 mL/min — AB (ref >60)
Glucose, Bld: 134 mg/dL — ABNORMAL HIGH (ref 70–99)
Potassium: 4.3 mmol/L (ref 3.5–5.1)
Sodium: 144 mmol/L (ref 135–145)

## 2019-03-07 LAB — GLUCOSE, CAPILLARY
GLUCOSE-CAPILLARY: 107 mg/dL — AB (ref 70–99)
GLUCOSE-CAPILLARY: 89 mg/dL (ref 70–99)
Glucose-Capillary: 96 mg/dL (ref 70–99)
Glucose-Capillary: 112 mg/dL — ABNORMAL HIGH (ref 70–99)
Glucose-Capillary: 96 mg/dL (ref 70–99)

## 2019-03-07 LAB — ECHOCARDIOGRAM COMPLETE
Height: 71 in
Weight: 3918.9 oz

## 2019-03-07 MED ORDER — RIVAROXABAN 15 MG PO TABS
15.0000 mg | ORAL_TABLET | Freq: Every day | ORAL | Status: DC
  Administered 2019-03-07 – 2019-03-25 (×19): 15 mg via ORAL
  Filled 2019-03-07 (×19): qty 1

## 2019-03-07 MED ORDER — DILTIAZEM HCL ER COATED BEADS 120 MG PO CP24
120.0000 mg | ORAL_CAPSULE | Freq: Every day | ORAL | Status: DC
  Administered 2019-03-07 – 2019-03-09 (×3): 120 mg via ORAL
  Filled 2019-03-07 (×3): qty 1

## 2019-03-07 MED ORDER — LEVALBUTEROL HCL 0.63 MG/3ML IN NEBU
0.6300 mg | INHALATION_SOLUTION | Freq: Two times a day (BID) | RESPIRATORY_TRACT | Status: DC
  Administered 2019-03-07 – 2019-03-09 (×4): 0.63 mg via RESPIRATORY_TRACT
  Filled 2019-03-07 (×4): qty 3

## 2019-03-07 MED ORDER — IPRATROPIUM BROMIDE 0.02 % IN SOLN
0.5000 mg | Freq: Two times a day (BID) | RESPIRATORY_TRACT | Status: DC
  Administered 2019-03-07 – 2019-03-26 (×37): 0.5 mg via RESPIRATORY_TRACT
  Filled 2019-03-07 (×40): qty 2.5

## 2019-03-07 NOTE — Progress Notes (Signed)
Patient refused morning labs.

## 2019-03-07 NOTE — Progress Notes (Signed)
TEAM 1 - Stepdown/ICU TEAM  David Barton  WCB:[id number] DOB: [birth year] DOA: 03/03/2019 PCP: David Dus, MD    Brief Narrative:  85 year old man prior tobacco, psvt, systolic hf ef 61-60% with normalization to 50's osa not on therapy, with a 4-day history of increasing shortness of breath with productive cough.  Flu swab negative at his physician's office on 3/12.  On Tamiflu empirically.  Developed fever and rigors 3/13 and was increasingly short of breath and altered this morning prompting his wife to call EMS. Prior history of pneumonia on background of COPD from remote smoking. No recent travel, no sick contacts.  Significant Events: 3/14 admit - intubated  3/15 extubated  3/17 [facility] assumed care   Subjective: Reports that he is feeling better. Denies cp, n/v, or abdom pain. Less SOB. Reports poor appetite.   Assessment & Plan:  Acute hypoxemic respiratory failure - Strep pneumoniae Pneumonia  continue ceftriaxone and azithromycin - RVP/Influenza negative - Nov-CoV pending (felt to be low risk) - still requiring HFNC support - wean O2 as able  Cut back fluids 125-->50 cc and then off am Change IV to oral levaquin to complete abx 1 mor eday  Septic shock resolved   Acute encephalopathy secondary to above - much improved   Acute renal failure Due to sepsis - crt appears to be slowly improving - keep hydrated  Fluids cut back Am labs  Newly diagnosed Atrial fibrillation w/ RVR chadvasc2> 4 As this is persisting will initiate full anticoag for now - adjust meds as HR poorly controlled - check TSH [[pending]- Mg is normal  Changing cardizem gtt to po cardizem 120 cd Get am echo  Moderate COPD No acute exacerbation at this time  No wheeze  Macrocytosis  Check B12 and folate   Obesity - Body mass index is 34.16 kg/m.   DVT prophylaxis: SQ heparin  Code Status: DNR - NO CODE Family Communication: no family present at time of exam  Disposition Plan:  SDU  Consultants:  PCCM  Antimicrobials:  Azithromycin 3/14 > Rocephin 3/14 >   Objective: Blood pressure (!) 135/57, pulse 67, temperature 98.5 F (36.9 C), temperature source Oral, resp. rate (!) 21, height 5\' 11"  (1.803 m), weight 111.1 kg, SpO2 95 %.  Intake/Output Summary (Last 24 hours) at 03/07/2019 1304 Last data filed at 03/07/2019 1200 Gross per 24 hour  Intake 2590.59 ml  Output 1475 ml  Net 1115.59 ml   Filed Weights   03/05/19 0414 03/06/19 0500 03/07/19 0355  Weight: 109.1 kg 110.1 kg 111.1 kg    Examination: General: No acute respiratory distress awake alert coherent in nad Lungs: bibasilar crackles - no wheezing no rales no rhonchi Cardiovascular: irreg irreg - no M or rub  Abdomen: Nontender, nondistended, soft, bowel sounds positive, no rebound, no ascites, no appreciable mass Extremities: No significant cyanosis, clubbing, or edema bilateral lower extremities Hand sswollen  CBC: Recent Labs  Lab 03/03/19 0947  03/03/19 1409 03/04/19 0329 03/05/19 0317 03/05/19 0539  WBC 29.4*  --  29.6* 25.0*  --  13.1*  NEUTROABS 25.3*  --   --   --   --   --   HGB 12.7*   < > 12.6* 11.3* 11.9* 11.6*  HCT 42.2   < > 41.3 36.3* 35.0* 38.0*  MCV 106.6*  --  107.6* 106.8*  --  107.0*  PLT 229  --  231 232  --  186   < > = values in this  interval not displayed.   Basic Metabolic Panel: Recent Labs  Lab 03/04/19 0329 03/05/19 0317 03/05/19 0539 03/07/19 0929  NA 141 142 143 144  K 4.8 4.8 5.3* 4.3  CL 106  --  111 113*  CO2 23  --  23 26  GLUCOSE 162*  --  130* 134*  BUN 80*  --  82* 62*  CREATININE 4.42*  --  3.62* 2.30*  CALCIUM 7.2*  --  7.5* 8.5*  MG  --   --  2.0  --   PHOS  --   --  5.4*  --    GFR: Estimated Creatinine Clearance: 30.8 mL/min (A) (by C-G formula based on SCr of 2.3 mg/dL (H)).  Liver Function Tests: Recent Labs  Lab 03/03/19 0947  AST 24  ALT 10  ALKPHOS 75  BILITOT 0.6  PROT 7.0  ALBUMIN 2.5*    Coagulation Profile:  Recent Labs  Lab 03/04/19 0329  INR 1.2    HbA1C: Hgb A1c MFr Bld  Date/Time Value Ref Range Status  11/05/2016 03:07 AM 5.2 4.8 - 5.6 % Final    Comment:    (NOTE)         Pre-diabetes: 5.7 - 6.4         Diabetes: >6.4         Glycemic control for adults with diabetes: <7.0     CBG: Recent Labs  Lab 03/06/19 2016 03/07/19 0003 03/07/19 0408 03/07/19 0806 03/07/19 1128  GLUCAP 106* 107* 96 89 112*    Recent Results (from the past 240 hour(s))  Respiratory Panel by PCR     Status: None   Collection Time: 03/03/19  9:23 AM  Result Value Ref Range Status   Adenovirus NOT DETECTED NOT DETECTED Final   Coronavirus 229E NOT DETECTED NOT DETECTED Final    Comment: (NOTE) The Coronavirus on the Respiratory Panel, DOES NOT test for the novel  Coronavirus (2019 nCoV)    Coronavirus HKU1 NOT DETECTED NOT DETECTED Final   Coronavirus NL63 NOT DETECTED NOT DETECTED Final   Coronavirus OC43 NOT DETECTED NOT DETECTED Final   Metapneumovirus NOT DETECTED NOT DETECTED Final   Rhinovirus / Enterovirus NOT DETECTED NOT DETECTED Final   Influenza A NOT DETECTED NOT DETECTED Final   Influenza B NOT DETECTED NOT DETECTED Final   Parainfluenza Virus 1 NOT DETECTED NOT DETECTED Final   Parainfluenza Virus 2 NOT DETECTED NOT DETECTED Final   Parainfluenza Virus 3 NOT DETECTED NOT DETECTED Final   Parainfluenza Virus 4 NOT DETECTED NOT DETECTED Final   Respiratory Syncytial Virus NOT DETECTED NOT DETECTED Final   Bordetella pertussis NOT DETECTED NOT DETECTED Final   Chlamydophila pneumoniae NOT DETECTED NOT DETECTED Final   Mycoplasma pneumoniae NOT DETECTED NOT DETECTED Final    Comment: Performed at [facility], [address] [place],  [address]  Novel Coronavirus, NAA (hospital order; send-out to ref lab)     Status: None   Collection Time: 03/03/19  9:24 AM  Result Value Ref Range Status   SARS-CoV-2, NAA Not Detected Not Detected Final    Comment: (NOTE)  Testing was performed using the cobas(R) SARS-CoV-2 test. This test was developed and its performance characteristics determined by [employer]. This test has not been FDA cleared or approved. This test has been authorized by FDA under an Emergency Use Authorization (EUA). This test is only authorized for the duration of time the declaration that circumstances exist justifying the authorization of the emergency  use of in vitro diagnostic tests for detection of SARS-CoV-2 virus and/or diagnosis of COVID-19 infection under section 564(b)(1) of the Act, 21 U.S.C. 778EUM-3(N)(3), unless the authorization is terminated or revoked sooner. Performed At: [facility] [address] [place], [place] [id number] Rush Farmer MD GQ:[id number]    Coronavirus Source NASAL SWAB  Final    Comment: Performed at [facility], [place] [address] [place], [place] [address]  Blood Culture (routine x 2)     Status: None (Preliminary result)   Collection Time: 03/03/19  9:47 AM  Result Value Ref Range Status   Specimen Description BLOOD RIGHT HAND  Final   Special Requests   Final    BOTTLES DRAWN AEROBIC AND ANAEROBIC Blood Culture adequate volume   Culture   Final    NO GROWTH 4 DAYS Performed at [facility], [place] [address], [place], [place] [address]    Report Status PENDING  Incomplete  Blood Culture (routine x 2)     Status: None (Preliminary result)   Collection Time: 03/03/19 10:38 AM  Result Value Ref Range Status   Specimen Description BLOOD LEFT HAND  Final   Special Requests AEROBIC BOTTLE ONLY Blood Culture adequate volume  Final   Culture   Final    NO GROWTH 4 DAYS Performed at [facility], [place] [address], [place], [place] [address]    Report Status PENDING  Incomplete  MRSA PCR Screening     Status: None   Collection Time: 03/03/19  3:59 PM  Result Value Ref Range Status   MRSA by PCR NEGATIVE NEGATIVE Final    Comment:        The GeneXpert MRSA  Assay (FDA approved for NASAL specimens only), is one component of a comprehensive MRSA colonization surveillance program. It is not intended to diagnose MRSA infection nor to guide or monitor treatment for MRSA infections. Performed at [facility], [place] [address], [place],  [address]   Culture, respiratory (non-expectorated)     Status: None   Collection Time: 03/03/19  6:38 PM  Result Value Ref Range Status   Specimen Description TRACHEAL ASPIRATE  Final   Special Requests NONE  Final   Gram Stain   Final    RARE WBC PRESENT, PREDOMINANTLY PMN FEW GRAM POSITIVE COCCI IN PAIRS Performed at [facility], [place] [address] [place],  [address]    Culture FEW STREPTOCOCCUS PNEUMONIAE  Final   Report Status 03/06/2019 FINAL  Final   Organism ID, Bacteria STREPTOCOCCUS PNEUMONIAE  Final      Susceptibility   Streptococcus pneumoniae - MIC*    ERYTHROMYCIN >=8 RESISTANT Resistant     LEVOFLOXACIN 1 SENSITIVE Sensitive     VANCOMYCIN 0.5 SENSITIVE Sensitive     PENICILLIN (non-meningitis) 1 SENSITIVE Sensitive     CEFTRIAXONE (non-meningitis) 1 SENSITIVE Sensitive     * FEW STREPTOCOCCUS PNEUMONIAE     Scheduled Meds: . arformoterol  15 mcg Nebulization BID  . budesonide (PULMICORT) nebulizer solution  0.25 mg Nebulization BID  . docusate sodium  100 mg Oral BID  . feeding supplement (ENSURE ENLIVE)  237 mL Oral BID BM  . heparin  5,000 Units Subcutaneous Q8H  . ipratropium  0.5 mg Nebulization BID  . levalbuterol  0.63 mg Nebulization BID  . mouth rinse  15 mL Mouth Rinse BID   Continuous Infusions: . sodium chloride 125 mL/hr at 03/07/19 1200  . cefTRIAXone (ROCEPHIN)  IV Stopped (  03/07/19 1132)  . diltiazem (CARDIZEM) infusion 5 mg/hr (03/07/19 1200)     LOS: 4 days   Verneita Griffes, MD [facility] 1:04 PM  If 7PM-7AM, please contact night-coverage per Amion 03/07/2019, 1:04 PM

## 2019-03-07 NOTE — Progress Notes (Signed)
ANTICOAGULATION CONSULT NOTE - Initial Consult  Pharmacy Consult for Xarelto Indication: atrial fibrillation  Allergies  Allergen Reactions  . Flexeril [Cyclobenzaprine] Other (See Comments)    Unknown reaction     Patient Measurements: Height: 5\' 11"  (180.3 cm) Weight: 244 lb 14.9 oz (111.1 kg) IBW/kg (Calculated) : 75.3  Vital Signs: Temp: 98.5 F (36.9 C) (03/18 1200) Temp Source: Oral (03/18 1200) BP: 122/73 (03/18 1300) Pulse Rate: 91 (03/18 1300)  Labs: Recent Labs    03/05/19 0317 03/05/19 0539 03/07/19 0929  HGB 11.9* 11.6*  --   HCT 35.0* 38.0*  --   PLT  --  186  --   CREATININE  --  3.62* 2.30*    Estimated Creatinine Clearance: 30.8 mL/min (A) (by C-G formula based on SCr of 2.3 mg/dL (H)).   Medical History: Past Medical History:  Diagnosis Date  . Alcohol abuse   . Colon cancer ([place])   . COPD (chronic obstructive pulmonary disease) ([place])   . Emphysema of lung ([place])   . Former tobacco use   . Hypertension   . Peripheral edema     Medications:  Medications Prior to Admission  Medication Sig Dispense Refill Last Dose  . acetaminophen (TYLENOL) 500 MG tablet Take 1,000 mg by mouth every 6 (six) hours as needed for fever.   03/02/2019 at Unknown time  . allopurinol (ZYLOPRIM) 100 MG tablet Take 100 mg by mouth daily.   03/02/2019 at am  . aspirin EC 81 MG tablet Take 81 mg by mouth daily.    03/02/2019 at am  . bisoprolol (ZEBETA) 5 MG tablet Take 5 mg by mouth daily.   03/02/2019 at 11a-12p  . ferrous sulfate 325 (65 FE) MG tablet Take 325 mg by mouth daily.    03/02/2019 at am  . furosemide (LASIX) 20 MG tablet Take 1 tablet (20 mg total) by mouth daily. 90 tablet 3 03/02/2019 at am  . HYDROcodone-homatropine (HYDROMET) 5-1.5 MG/5ML syrup Take 5 mLs by mouth every 6 (six) hours as needed for cough.   03/02/2019 at Unknown time  . Multiple Vitamin (MULTIVITAMIN WITH MINERALS) TABS tablet Take 1 tablet by mouth daily.   03/02/2019 at am  . oseltamivir  (TAMIFLU) 75 MG capsule Take 75 mg by mouth 2 (two) times daily. 5 day course started 03/01/19 pm   03/02/2019 at pm  . predniSONE (DELTASONE) 10 MG tablet Take 10 mg by mouth daily.   03/02/2019 at am  . tiotropium (SPIRIVA) 18 MCG inhalation capsule Place 1 capsule (18 mcg total) into inhaler and inhale daily. 30 capsule 12 03/02/2019 at am    Assessment: 24 YOM who presented with SOB found to have new AFib with CHADSVASc score 4. Pharmacy consulted to start rivaroxaban for stroke prevention. H/H from 3/16 was low stable. Plt wnl. CrCl ~ 38 mL/min.   Goal of Therapy:  Primary stroke prevention Monitor platelets by anticoagulation protocol: Yes   Plan:  -Start rivaroxaban 15 mg daily (dose adjusted for renal function)  -Monitor for s/s of bleeding -Will educate patient prior to discharge   Albertina Parr, PharmD., BCPS Clinical Pharmacist Clinical phone for 03/07/19 until 3:30pm: [phone] If after 3:30pm, please refer to [facility] for unit-specific pharmacist

## 2019-03-07 NOTE — Progress Notes (Signed)
Covid-19 lab results are negative.

## 2019-03-07 NOTE — Progress Notes (Signed)
  Echocardiogram 2D Echocardiogram has been performed.  David Barton 03/07/2019, 3:36 PM

## 2019-03-08 DIAGNOSIS — G9341 Metabolic encephalopathy: Secondary | ICD-10-CM

## 2019-03-08 DIAGNOSIS — D72829 Elevated white blood cell count, unspecified: Secondary | ICD-10-CM

## 2019-03-08 DIAGNOSIS — J13 Pneumonia due to Streptococcus pneumoniae: Secondary | ICD-10-CM

## 2019-03-08 LAB — CULTURE, BLOOD (ROUTINE X 2)
CULTURE: NO GROWTH
Culture: NO GROWTH
SPECIAL REQUESTS: ADEQUATE
Special Requests: ADEQUATE

## 2019-03-08 MED ORDER — SODIUM POLYSTYRENE SULFONATE 15 GM/60ML PO SUSP: 15.0000 g | Freq: Once | ORAL | Status: DC

## 2019-03-08 NOTE — Progress Notes (Signed)
  Pt orientation to unit, room and routine. Information packet given to patient/family and safety video watched.  Admission INP armband ID verified with patient/family, and in place. SR up x 2, fall risk assessment complete with Patient and family verbalizing understanding of risks associated with falls. Pt verbalizes an understanding of how to use the call bell and to call for help before getting out of bed.  Skin, clean-dry- intact , without evidence of bruising, or skin tears.   No evidence of skin break down noted on exam. Pt place on the progressive monitor,.   Will cont to monitor and assist as needed.  Sagal Gayton Shelda Pal, RN 03/08/2019 7:05 PM

## 2019-03-08 NOTE — Progress Notes (Signed)
PROGRESS NOTE    David Barton  HDQ:[id number]  DOB: August 25, 1933  DOA: 03/03/2019 PCP: Maury Dus, MD  Brief Narrative:   85 year old male with history of COPD/emphysema, hypertension, PSVT, chronic systolic CHF with EF of 30 to 35%, alcohol abuse, colon cancer  admitted to this [facility] on March 14 with community-acquired pneumonia/acute hypoxic respiratory failure requiring mechanical ventilation and septic shock with AKI requiring pressors.  Patient extubated on March 15 and transferred out of ICU to hospitalist service on March 17.  Of note,Flu swab negative at his physician's office on 3/12, started on Tamiflu empirically   Subjective:  Patient resting comfortably, appears somnolent but communicating well.  Saturating well on 5 L nasal cannula.  Objective: Vitals:   03/08/19 0748 03/08/19 0800 03/08/19 0810 03/08/19 0830  BP: 126/71 (!) 130/58    Pulse: (!) 109 (!) 103  (!) 121  Resp: (!) 22 (!) 22  (!) 28  Temp:   97.7 F (36.5 C)   TempSrc:   Oral   SpO2: 97% 96%  92%  Weight:      Height:        Intake/Output Summary (Last 24 hours) at 03/08/2019 1222 Last data filed at 03/08/2019 0830 Gross per 24 hour  Intake 1010.76 ml  Output 1510 ml  Net -499.24 ml   Filed Weights   03/06/19 0500 03/07/19 0355 03/08/19 0600  Weight: 110.1 kg 111.1 kg 111.9 kg    Physical Examination:  General exam: Appears calm and comfortable  Respiratory system: Clear to auscultation. Respiratory effort normal. Cardiovascular system: S1 & S2 heard, RRR. No JVD, murmurs, rubs, gallops or clicks.  Gastrointestinal system: Abdomen is obese, nondistended, soft and nontender. No organomegaly or masses felt. Normal bowel sounds heard. Central nervous system: Alert and oriented. No focal neurological deficits. Extremities: Symmetric 5 x 5 power.  Trace leg edema Skin: No rashes, lesions or ulcers .     Data Reviewed: I have personally reviewed following labs and imaging  studies  CBC: Recent Labs  Lab 03/03/19 0947 03/03/19 1028 03/03/19 1409 03/04/19 0329 03/05/19 0317 03/05/19 0539  WBC 29.4*  --  29.6* 25.0*  --  13.1*  NEUTROABS 25.3*  --   --   --   --   --   HGB 12.7* 12.9* 12.6* 11.3* 11.9* 11.6*  HCT 42.2 38.0* 41.3 36.3* 35.0* 38.0*  MCV 106.6*  --  107.6* 106.8*  --  107.0*  PLT 229  --  231 232  --  119   Basic Metabolic Panel: Recent Labs  Lab 03/03/19 1409 03/03/19 2337 03/04/19 0329 03/05/19 0317 03/05/19 0539 03/07/19 0929  NA 137 139 141 142 143 144  K 5.7* 5.4* 4.8 4.8 5.3* 4.3  CL 104 106 106  --  111 113*  CO2 22 21* 23  --  23 26  GLUCOSE 152* 156* 162*  --  130* 134*  BUN 70* 77* 80*  --  82* 62*  CREATININE 4.36* 4.31* 4.42*  --  3.62* 2.30*  CALCIUM 7.1* 7.1* 7.2*  --  7.5* 8.5*  MG  --   --   --   --  2.0  --   PHOS  --   --   --   --  5.4*  --    GFR: Estimated Creatinine Clearance: 30.9 mL/min (A) (by C-G formula based on SCr of 2.3 mg/dL (H)). Liver Function Tests: Recent Labs  Lab 03/03/19 0947  AST 24  ALT 10  ALKPHOS 75  BILITOT 0.6  PROT 7.0  ALBUMIN 2.5*   No results for input(s): LIPASE, AMYLASE in the last 168 hours. No results for input(s): AMMONIA in the last 168 hours. Coagulation Profile: Recent Labs  Lab 03/04/19 0329  INR 1.2   Cardiac Enzymes: No results for input(s): CKTOTAL, CKMB, CKMBINDEX, TROPONINI in the last 168 hours. BNP (last 3 results) No results for input(s): PROBNP in the last 8760 hours. HbA1C: No results for input(s): HGBA1C in the last 72 hours. CBG: Recent Labs  Lab 03/07/19 0003 03/07/19 0408 03/07/19 0806 03/07/19 1128 03/07/19 1550  GLUCAP 107* 96 89 112* 96   Lipid Profile: No results for input(s): CHOL, HDL, LDLCALC, TRIG, CHOLHDL, LDLDIRECT in the last 72 hours. Thyroid Function Tests: No results for input(s): TSH, T4TOTAL, FREET4, T3FREE, THYROIDAB in the last 72 hours. Anemia Panel: No results for input(s): VITAMINB12, FOLATE, FERRITIN,  TIBC, IRON, RETICCTPCT in the last 72 hours. Sepsis Labs: Recent Labs  Lab 03/03/19 0947 03/03/19 1409  LATICACIDVEN 1.6 2.2*    Recent Results (from the past 240 hour(s))  Respiratory Panel by PCR     Status: None   Collection Time: 03/03/19  9:23 AM  Result Value Ref Range Status   Adenovirus NOT DETECTED NOT DETECTED Final   Coronavirus 229E NOT DETECTED NOT DETECTED Final    Comment: (NOTE) The Coronavirus on the Respiratory Panel, DOES NOT test for the novel  Coronavirus (2019 nCoV)    Coronavirus HKU1 NOT DETECTED NOT DETECTED Final   Coronavirus NL63 NOT DETECTED NOT DETECTED Final   Coronavirus OC43 NOT DETECTED NOT DETECTED Final   Metapneumovirus NOT DETECTED NOT DETECTED Final   Rhinovirus / Enterovirus NOT DETECTED NOT DETECTED Final   Influenza A NOT DETECTED NOT DETECTED Final   Influenza B NOT DETECTED NOT DETECTED Final   Parainfluenza Virus 1 NOT DETECTED NOT DETECTED Final   Parainfluenza Virus 2 NOT DETECTED NOT DETECTED Final   Parainfluenza Virus 3 NOT DETECTED NOT DETECTED Final   Parainfluenza Virus 4 NOT DETECTED NOT DETECTED Final   Respiratory Syncytial Virus NOT DETECTED NOT DETECTED Final   Bordetella pertussis NOT DETECTED NOT DETECTED Final   Chlamydophila pneumoniae NOT DETECTED NOT DETECTED Final   Mycoplasma pneumoniae NOT DETECTED NOT DETECTED Final    Comment: Performed at [facility], [place]. [address] [place], [place] [address]  Novel Coronavirus, NAA (hospital order; send-out to ref lab)     Status: None   Collection Time: 03/03/19  9:24 AM  Result Value Ref Range Status   SARS-CoV-2, NAA Not Detected Not Detected Final    Comment: (NOTE) Testing was performed using the cobas(R) SARS-CoV-2 test. This test was developed and its performance characteristics determined by [employer]. This test has not been FDA cleared or approved. This test has been authorized by FDA under an Emergency Use Authorization (EUA). This test is  only authorized for the duration of time the declaration that circumstances exist justifying the authorization of the emergency use of in vitro diagnostic tests for detection of SARS-CoV-2 virus and/or diagnosis of COVID-19 infection under section 564(b)(1) of the Act, 21 U.S.C. 384YKZ-9(D)(3), unless the authorization is terminated or revoked sooner. Performed At: [facility] [address] [place], [place] [id number] Rush Farmer MD [id number]    Coronavirus Source NASAL SWAB  Final    Comment: Performed at [facility], [place] [address] [place], [place] [address]  Blood Culture (routine x 2)  Status: None   Collection Time: 03/03/19  9:47 AM  Result Value Ref Range Status   Specimen Description BLOOD RIGHT HAND  Final   Special Requests   Final    BOTTLES DRAWN AEROBIC AND ANAEROBIC Blood Culture adequate volume   Culture   Final    NO GROWTH 5 DAYS Performed at [facility], [address], [place], [place] [address]    Report Status 03/08/2019 FINAL  Final  Blood Culture (routine x 2)     Status: None   Collection Time: 03/03/19 10:38 AM  Result Value Ref Range Status   Specimen Description BLOOD LEFT HAND  Final   Special Requests AEROBIC BOTTLE ONLY Blood Culture adequate volume  Final   Culture   Final    NO GROWTH 5 DAYS Performed at [facility], [place] [address] [place], [place] [address]    Report Status 03/08/2019 FINAL  Final  MRSA PCR Screening     Status: None   Collection Time: 03/03/19  3:59 PM  Result Value Ref Range Status   MRSA by PCR NEGATIVE NEGATIVE Final    Comment:        The GeneXpert MRSA Assay (FDA approved for NASAL specimens only), is one component of a comprehensive MRSA colonization surveillance program. It is not intended to diagnose MRSA infection nor to guide or monitor treatment for MRSA infections. Performed at [facility], [place] [address], [place], [place] [address]   Culture, respiratory  (non-expectorated)     Status: None   Collection Time: 03/03/19  6:38 PM  Result Value Ref Range Status   Specimen Description TRACHEAL ASPIRATE  Final   Special Requests NONE  Final   Gram Stain   Final    RARE WBC PRESENT, PREDOMINANTLY PMN FEW GRAM POSITIVE COCCI IN PAIRS Performed at [facility], [place] [address], [place], [place] [address]    Culture FEW STREPTOCOCCUS PNEUMONIAE  Final   Report Status 03/06/2019 FINAL  Final   Organism ID, Bacteria STREPTOCOCCUS PNEUMONIAE  Final      Susceptibility   Streptococcus pneumoniae - MIC*    ERYTHROMYCIN >=8 RESISTANT Resistant     LEVOFLOXACIN 1 SENSITIVE Sensitive     VANCOMYCIN 0.5 SENSITIVE Sensitive     PENICILLIN (non-meningitis) 1 SENSITIVE Sensitive     CEFTRIAXONE (non-meningitis) 1 SENSITIVE Sensitive     * FEW STREPTOCOCCUS PNEUMONIAE      Radiology Studies: No results found.      Scheduled Meds:  arformoterol  15 mcg Nebulization BID   budesonide (PULMICORT) nebulizer solution  0.25 mg Nebulization BID   diltiazem  120 mg Oral Daily   docusate sodium  100 mg Oral BID   feeding supplement (ENSURE ENLIVE)  237 mL Oral BID BM   ipratropium  0.5 mg Nebulization BID   levalbuterol  0.63 mg Nebulization BID   mouth rinse  15 mL Mouth Rinse BID   rivaroxaban  15 mg Oral Q supper   Continuous Infusions:  sodium chloride Stopped (03/08/19 0559)   cefTRIAXone (ROCEPHIN)  IV 2 g (03/08/19 1107)    Assessment & Plan:    1.  Acute hypoxic respiratory failure/septic shock secondary to streptococcal pneumonia: Present on admission.  Blood pressure now stable.  Off pressors.  Off mechanical ventilation.  Flu negative.  PCR negative for noval coronavirus.  Was on high flow nasal cannula, currently on 5 L.  IV fluids discontinued.  Remains on IV ceftriaxone.  Status post azithromycin course.  Continue neb treatments  2.  Metabolic encephalopathy: Secondary to problem #1.  Appears oriented currently.   Continue delirium precautions.  3.  Leukocytosis: Improved from 29k to 13k. Patient does not seem to have gotten IV steroids in this hospitalization but might have gotten at outside facility.  Patient's home med list shows prednisone 10 mg daily.  T-max of 100.8 on the day of admission, subsequently remained afebrile.  Blood cultures from March 14-ve so far.  Will continue to trend.  4. Atrial fibrillation:?  New onset.  Patient has history of PSVT and home med list shows bisoprolol.  While hospitalized, patient initially on Cardizem drip, now transitioned to oral Cardizem.  Also on Xarelto  5. Moderate COPD: Not on systemic steroids.  Continue inhalers and empiric antibiotics  6.  AKI: Likely secondary to problem #1/ATN.  Improved with IV hydration.  Now off IV fluids. Creatinine improved from 4.3 on admission to 2.3 now.  BUN improved from 80s to 60s. Mild hyperkalemia noted.  7.  Chronic combined CHF: Patient previously had EF of 30 to 35% in 2017 which normalized in 2018.  Echo in this admission showed preserved EF with stage I diastolic dysfunction.  Has trace leg edema but will avoid diuretics in view of problem #6.  On aspirin, bisoprolol and Lasix 20 mg daily at home.  DVT prophylaxis: On anticoagulation Code Status: Full code Family / Patient Communication: No family present bedside Disposition Plan: Will follow-up PT recommendations     LOS: 5 days    Time spent: 35 minutes    Guilford Shi, MD [facility] Pager [phone]  If 7PM-7AM, please contact night-coverage [email or web address] Password [facility] 03/08/2019, 12:22 PM

## 2019-03-08 NOTE — TOC Benefit Eligibility Note (Signed)
Transition of Care [facility]) Benefit Eligibility Note    Patient Details  Name: David Barton MRN: [id number] Date of Birth: 04-01-1933   Medication/Dose: Xarelto/ starter pack and 20mg  qd  Covered?: Yes  Prescription Coverage Preferred Pharmacy: [facility]  Spoke with Person/Company/Phone Number:: [employer]  Co-Pay: 30 day supply: $47.00 / 90 day supply: $141.00  Prior Approval: No     [place] Phone Number: 03/08/2019, 4:21 PM

## 2019-03-08 NOTE — Progress Notes (Signed)
Nutrition Follow-up  DOCUMENTATION CODES:   Obesity unspecified  INTERVENTION:   Liberalize diet to Regular  Continue Ensure Enlive po BID, each supplement provides 350 kcal and 20 grams of protein   NUTRITION DIAGNOSIS:   Inadequate oral intake related to poor appetite as evidenced by per patient/family report.  Being addressed via diet liberalization, supplement  GOAL:   Patient will meet greater than or equal to 90% of their needs  Progressing  MONITOR:   PO intake, Supplement acceptance, Labs, Weight trends  REASON FOR ASSESSMENT:   Consult Assessment of nutrition requirement/status, Enteral/tube feeding initiation and management  ASSESSMENT:   85 yo male admitted with acute respiratory failure related to CAP requiring brief intubation, septic shock, acute encephalopathy, ARF. PMH includes COPD, HTN, EtOH abuse, colon cancer, former tobacco abuse  Recorded po intake 25% of meals, appetite fair Pt does not like the food; does not like options on SOFT diet. Pt does not have any acute GI issues indicating need for GI Soft diet. Recommend liberalizing to Regular  COVID-19 negative  Labs: Creatinine 2.30, BUN 62 Meds: NS at 50 ml/hr   Diet Order:  SOFT    EDUCATION NEEDS:   Not appropriate for education at this time  Skin:  Skin Assessment: Reviewed RN Assessment  Last BM:  3/15  Height:   Ht Readings from Last 1 Encounters:  03/03/19 5\' 11"  (1.803 m)    Weight:   Wt Readings from Last 1 Encounters:  03/08/19 111.9 kg    Ideal Body Weight:  78.2 kg  BMI:  Body mass index is 34.41 kg/m.  Estimated Nutritional Needs:   Kcal:  9242-6834 kcals   Protein:  95-110 g   Fluid:  >/= 1.8 L   Kerman Passey MS, RD, LDN, CNSC [phone] Pager  [phone] Weekend/On-Call Pager

## 2019-03-09 ENCOUNTER — Encounter (HOSPITAL_COMMUNITY): Payer: Self-pay | Admitting: Physical Medicine and Rehabilitation

## 2019-03-09 ENCOUNTER — Inpatient Hospital Stay (HOSPITAL_COMMUNITY): Payer: Medicare Other

## 2019-03-09 DIAGNOSIS — J441 Chronic obstructive pulmonary disease with (acute) exacerbation: Secondary | ICD-10-CM

## 2019-03-09 DIAGNOSIS — R5381 Other malaise: Secondary | ICD-10-CM

## 2019-03-09 LAB — BRAIN NATRIURETIC PEPTIDE: B Natriuretic Peptide: 546.9 pg/mL — ABNORMAL HIGH (ref 0.0–100.0)

## 2019-03-09 LAB — BASIC METABOLIC PANEL
Anion gap: 10 (ref 5–15)
BUN: 48 mg/dL — ABNORMAL HIGH (ref 8–23)
CHLORIDE: 114 mmol/L — AB (ref 98–111)
CO2: 21 mmol/L — ABNORMAL LOW (ref 22–32)
Calcium: 8.8 mg/dL — ABNORMAL LOW (ref 8.9–10.3)
Creatinine, Ser: 1.96 mg/dL — ABNORMAL HIGH (ref 0.61–1.24)
GFR calc Af Amer: 36 mL/min — ABNORMAL LOW (ref >60)
GFR calc non Af Amer: 31 mL/min — ABNORMAL LOW (ref >60)
Glucose, Bld: 83 mg/dL (ref 70–99)
Potassium: 4.2 mmol/L (ref 3.5–5.1)
Sodium: 145 mmol/L (ref 135–145)

## 2019-03-09 LAB — CBC
HCT: 37.7 % — ABNORMAL LOW (ref 39.0–52.0)
Hemoglobin: 11.6 g/dL — ABNORMAL LOW (ref 13.0–17.0)
MCH: 32.7 pg (ref 26.0–34.0)
MCHC: 30.8 g/dL (ref 30.0–36.0)
MCV: 106.2 fL — ABNORMAL HIGH (ref 80.0–100.0)
Platelets: 261 10*3/uL (ref 150–400)
RBC: 3.55 MIL/uL — ABNORMAL LOW (ref 4.22–5.81)
RDW: 16.5 % — ABNORMAL HIGH (ref 11.5–15.5)
WBC: 7.6 10*3/uL (ref 4.0–10.5)
nRBC: 0 % (ref 0.0–0.2)

## 2019-03-09 LAB — PROCALCITONIN: PROCALCITONIN: 2.87 ng/mL

## 2019-03-09 MED ORDER — SALINE SPRAY 0.65 % NA SOLN
1.0000 | NASAL | Status: DC | PRN
  Filled 2019-03-09: qty 44

## 2019-03-09 MED ORDER — POLYETHYLENE GLYCOL 3350 17 G PO PACK: 17.0000 g | PACK | Freq: Every day | ORAL | Status: DC | PRN

## 2019-03-09 MED ORDER — BUDESONIDE 0.5 MG/2ML IN SUSP
0.5000 mg | Freq: Two times a day (BID) | RESPIRATORY_TRACT | Status: DC
  Administered 2019-03-09 – 2019-03-26 (×34): 0.5 mg via RESPIRATORY_TRACT
  Filled 2019-03-09 (×36): qty 2

## 2019-03-09 MED ORDER — LORATADINE 10 MG PO TABS: 10.0000 mg | ORAL_TABLET | Freq: Every day | ORAL | Status: DC | PRN

## 2019-03-09 MED ORDER — PHENOL 1.4 % MT LIQD: 1.0000 | OROMUCOSAL | Status: DC | PRN

## 2019-03-09 MED ORDER — LEVALBUTEROL HCL 0.63 MG/3ML IN NEBU
0.6300 mg | INHALATION_SOLUTION | Freq: Three times a day (TID) | RESPIRATORY_TRACT | Status: DC
  Administered 2019-03-10 – 2019-03-14 (×12): 0.63 mg via RESPIRATORY_TRACT
  Filled 2019-03-09 (×12): qty 3

## 2019-03-09 MED ORDER — DILTIAZEM HCL ER COATED BEADS 180 MG PO CP24
180.0000 mg | ORAL_CAPSULE | Freq: Every day | ORAL | Status: DC
  Administered 2019-03-10 – 2019-03-11 (×2): 180 mg via ORAL
  Filled 2019-03-09 (×2): qty 1

## 2019-03-09 MED ORDER — LEVALBUTEROL HCL 0.63 MG/3ML IN NEBU
0.6300 mg | INHALATION_SOLUTION | Freq: Three times a day (TID) | RESPIRATORY_TRACT | Status: DC
  Administered 2019-03-09 (×2): 0.63 mg via RESPIRATORY_TRACT
  Filled 2019-03-09 (×2): qty 3

## 2019-03-09 MED ORDER — HYDROCORTISONE 1 % EX CREA
1.0000 "application " | TOPICAL_CREAM | Freq: Three times a day (TID) | CUTANEOUS | Status: DC | PRN
  Filled 2019-03-09: qty 28

## 2019-03-09 MED ORDER — MUSCLE RUB 10-15 % EX CREA
1.0000 "application " | TOPICAL_CREAM | CUTANEOUS | Status: DC | PRN
  Filled 2019-03-09: qty 85

## 2019-03-09 MED ORDER — DILTIAZEM HCL 60 MG PO TABS
60.0000 mg | ORAL_TABLET | Freq: Four times a day (QID) | ORAL | Status: AC
  Administered 2019-03-09 (×3): 60 mg via ORAL
  Filled 2019-03-09 (×3): qty 1

## 2019-03-09 MED ORDER — POLYVINYL ALCOHOL 1.4 % OP SOLN
1.0000 [drp] | OPHTHALMIC | Status: DC | PRN
  Filled 2019-03-09: qty 15

## 2019-03-09 MED ORDER — ALUM & MAG HYDROXIDE-SIMETH 200-200-20 MG/5ML PO SUSP: 30.0000 mL | ORAL | Status: DC | PRN

## 2019-03-09 MED ORDER — HYDROCORTISONE 2.5 % RE CREA
1.0000 "application " | TOPICAL_CREAM | Freq: Four times a day (QID) | RECTAL | Status: DC | PRN
  Filled 2019-03-09: qty 28.35

## 2019-03-09 MED ORDER — METHYLPREDNISOLONE SODIUM SUCC 40 MG IJ SOLR
40.0000 mg | Freq: Three times a day (TID) | INTRAMUSCULAR | Status: DC
  Administered 2019-03-09 – 2019-03-17 (×24): 40 mg via INTRAVENOUS
  Filled 2019-03-09 (×24): qty 1

## 2019-03-09 MED ORDER — FUROSEMIDE 10 MG/ML IJ SOLN
40.0000 mg | Freq: Three times a day (TID) | INTRAMUSCULAR | Status: DC
  Administered 2019-03-09 – 2019-03-10 (×3): 40 mg via INTRAVENOUS
  Filled 2019-03-09 (×3): qty 4

## 2019-03-09 MED ORDER — LIP MEDEX EX OINT
1.0000 "application " | TOPICAL_OINTMENT | CUTANEOUS | Status: DC | PRN
  Filled 2019-03-09: qty 7

## 2019-03-09 MED ORDER — SENNOSIDES-DOCUSATE SODIUM 8.6-50 MG PO TABS: 2.0000 | ORAL_TABLET | Freq: Every evening | ORAL | Status: DC | PRN

## 2019-03-09 NOTE — Care Management Important Message (Signed)
Important Message  Patient Details  Name: [name] MRN: [id number] Date of Birth: [birth year]   Medicare Important Message Given:  Yes    [name] 03/09/2019, 3:46 PM

## 2019-03-09 NOTE — Evaluation (Signed)
Physical Therapy Evaluation Patient Details Name: David Barton MRN: [id number] DOB: 1933/03/11 Today's Date: 03/09/2019   History of Present Illness  85 year old man prior tobacco, psvt, systolic hf ef 29-51% with normalization to 50's osa not on therapy, with a 4-day history of increasing shortness of breath with productive cough.  Flu swab negative at his physician's office on 3/12.  On Tamiflu empirically.  Developed fever and rigors 3/13 and was increasingly short of breath and altered this morning prompting his wife to call EMS. Prior history of pneumonia on background of COPD from remote smoking. No recent travel, no sick contacts.    Clinical Impression  Pt admitted with above diagnosis. Pt currently with functional limitations due to the deficits listed below (see PT Problem List). PTA, pt living at home with wife independent with all mobility. Today patient weak and requiring hands on assistance for bed mobility and transfers. Wil continue to assess and update recs if appropriate.   Pt will benefit from skilled PT to increase their independence and safety with mobility to allow discharge to the venue listed below.    VSS on 4L    Follow Up Recommendations CIR (pending progress)    Equipment Recommendations  (TBD)    Recommendations for Other Services OT consult     Precautions / Restrictions Precautions Precautions: Fall Restrictions Weight Bearing Restrictions: No      Mobility  Bed Mobility Overal bed mobility: Needs Assistance Bed Mobility: Supine to Sit     Supine to sit: Min assist     General bed mobility comments: min A to supine<>sit  Transfers Overall transfer level: Needs assistance Equipment used: Rolling walker (2 wheeled) Transfers: Sit to/from Stand Sit to Stand: Min assist         General transfer comment: min A to stand and pivot to bed.   Ambulation/Gait             General Gait Details: deferred at this time, patient transfered  to transport bed, tolerating pericare EOB and becoming fatigued  before gait trial.   Stairs            Wheelchair Mobility    Modified Rankin (Stroke Patients Only)       Balance Overall balance assessment: Needs assistance   Sitting balance-Leahy Scale: Fair       Standing balance-Leahy Scale: Poor                               Pertinent Vitals/Pain Pain Assessment: No/denies pain(chronic L shoulder pain)    Home Living Family/patient expects to be discharged to:: Private residence Living Arrangements: Spouse/significant other Available Help at Discharge: Family;Available 24 hours/day Type of Home: House Home Access: Stairs to enter   [employer] of Steps: 2 Home Layout: One level Home Equipment: None      Prior Function Level of Independence: Independent         Comments: lives with wife, indpendent at [employer]        Extremity/Trunk Assessment   Upper Extremity Assessment Upper Extremity Assessment: Generalized weakness    Lower Extremity Assessment Lower Extremity Assessment: Generalized weakness       Communication   Communication: No difficulties  Cognition Arousal/Alertness: Awake/alert Behavior During Therapy: WFL for tasks assessed/performed Overall Cognitive Status: Within Functional Limits for tasks assessed  General Comments      Exercises     Assessment/Plan    PT Assessment Patient needs continued PT services  PT Problem List Decreased strength       PT Treatment Interventions DME instruction;Gait training;Stair training;Functional mobility training;Therapeutic activities;Therapeutic exercise;Balance training    PT Goals (Current goals can be found in the Care Plan section)  Acute Rehab PT Goals Patient Stated Goal: get stronger PT Goal Formulation: With patient Time For Goal Achievement: 03/23/19 Potential to  Achieve Goals: Good    Frequency Min 3X/week   Barriers to discharge        Co-evaluation               AM-PAC PT "6 Clicks" Mobility  Outcome Measure Help needed turning from your back to your side while in a flat bed without using bedrails?: A Little Help needed moving from lying on your back to sitting on the side of a flat bed without using bedrails?: A Little Help needed moving to and from a bed to a chair (including a wheelchair)?: A Little Help needed standing up from a chair using your arms (e.g., wheelchair or bedside chair)?: A Lot Help needed to walk in hospital room?: A Lot Help needed climbing 3-5 steps with a railing? : A Lot 6 Click Score: 15    End of Session Equipment Utilized During Treatment: Gait belt Activity Tolerance: Patient tolerated treatment well Patient left: in bed;with call bell/phone within reach;with nursing/sitter in room Nurse Communication: Mobility status PT Visit Diagnosis: Unsteadiness on feet (R26.81)    Time: 9532-0233 PT Time Calculation (min) (ACUTE ONLY): 27 min   Charges:   PT Evaluation $PT Eval Moderate Complexity: 1 Mod PT Treatments $Therapeutic Activity: 8-22 mins        Reinaldo Berber, PT, DPT Acute Rehabilitation Services Pager: [phone] Office: [phone]    Reinaldo Berber 03/09/2019, 12:48 AM

## 2019-03-09 NOTE — Progress Notes (Signed)
Inpatient Rehabilitation-Admissions Coordinator    Met with patient at the bedside (and spoke with wife via phone) to discuss team's recommendation for inpatient rehabilitation. Shared booklets, expectations while in CIR, expected length of stay, and anticipated functional level at DC. Pt interested in CIR at this time.   Await OT evaluation in order to begin insurance auth process for possible CIR admission.  Jhonnie Garner, OTR/L  Rehab Admissions Coordinator  [phone] 03/09/2019 2:44 PM

## 2019-03-09 NOTE — Progress Notes (Signed)
PT Cancellation Note  Patient Details Name: David Barton MRN: [id number] DOB: 11-02-1933   Cancelled Treatment:    Reason Eval/Treat Not Completed: Medical issues which prohibited therapy, RN asks that we hold off for today. States patient is on 5 lpm and is barely able to maintain sats at 90%. Will re-attempt over the weekend if time allows.       [name] 03/09/2019, 1:46 PM

## 2019-03-09 NOTE — Consult Note (Signed)
Physical Medicine and Rehabilitation Consult   Reason for Consult: Debility Referring Physician: Dr. Reesa Chew   HPI: David Barton is a 85 y.o. male with history of COPD, CKD, colon cancer, alcohol abuse, 3-4 day history of cough with progressive SOB who was admitted on 03/03/19 with fever and chills and MS changes due to strep PNA. He was briefly intubated from 3/14-3/15,  transitioned to HFNC and treated with Ceftriaxone and Zithromax for CAP. Negative for RSV and Nov-CoV pending but felt to be low risk. Hospital course significant for A fib with RVR, acute on chronic renal failure requiring IVF for hydration as well as hypoxia requiring HFNC. Therapy evaluation yesterday revealed debility with deficits in mobility. Patient reports that nursing had him sitting up in a chair for 1-2 hours yesterday. CIR recommended due to decline in functional status   Review of Systems  Constitutional: Negative for chills and fever.  HENT: Positive for hearing loss.   Eyes: Negative for blurred vision and double vision.  Respiratory: Positive for wheezing. Negative for cough and hemoptysis.   Cardiovascular: Negative for chest pain and palpitations.  Gastrointestinal: Negative for abdominal pain, heartburn and nausea.  Genitourinary: Positive for dysuria. Negative for frequency.  Musculoskeletal: Negative for myalgias.  Skin: Negative for rash.  Neurological: Negative for dizziness, speech change and focal weakness.     Past Medical History:  Diagnosis Date  . Alcohol abuse   . Colon cancer ([place])   . COPD (chronic obstructive pulmonary disease) ([place])   . Emphysema of lung ([place])   . Former tobacco use   . Hypertension   . Peripheral edema   . Sepsis ([place]) 10/2016   Aspiration PNA/C diff colitis    Past Surgical History:  Procedure Laterality Date  . CATARACT EXTRACTION    . COLON RESECTION    . COLONOSCOPY    . IMPLANTATION BONE ANCHORED HEARING AID Left 2016  . MINOR  HEMORRHOIDECTOMY  1995  . TONSILLECTOMY      Family History  Problem Relation Age of Onset  . Brain cancer Father   . Colon cancer Mother        with mets to liver  . Lung cancer Brother        was a smoker    Social History:  Married. Retired. Per reports that he quit smoking about 37 years ago. His smoking use included cigarettes. He has a 20.00 pack-year smoking history. He has never used smokeless tobacco. He reports current alcohol use of about 7.0 standard drinks of alcohol per week. He reports that he does not use drugs.    Allergies  Allergen Reactions  . Flexeril [Cyclobenzaprine] Other (See Comments)    Unknown reaction     Medications Prior to Admission  Medication Sig Dispense Refill  . acetaminophen (TYLENOL) 500 MG tablet Take 1,000 mg by mouth every 6 (six) hours as needed for fever.    Marland Kitchen allopurinol (ZYLOPRIM) 100 MG tablet Take 100 mg by mouth daily.    Marland Kitchen aspirin EC 81 MG tablet Take 81 mg by mouth daily.     . bisoprolol (ZEBETA) 5 MG tablet Take 5 mg by mouth daily.    . ferrous sulfate 325 (65 FE) MG tablet Take 325 mg by mouth daily.     . furosemide (LASIX) 20 MG tablet Take 1 tablet (20 mg total) by mouth daily. 90 tablet 3  . HYDROcodone-homatropine (HYDROMET) 5-1.5 MG/5ML syrup Take 5 mLs by mouth every 6 (  six) hours as needed for cough.    . Multiple Vitamin (MULTIVITAMIN WITH MINERALS) TABS tablet Take 1 tablet by mouth daily.    Marland Kitchen oseltamivir (TAMIFLU) 75 MG capsule Take 75 mg by mouth 2 (two) times daily. 5 day course started 03/01/19 pm    . predniSONE (DELTASONE) 10 MG tablet Take 10 mg by mouth daily.    Marland Kitchen tiotropium (SPIRIVA) 18 MCG inhalation capsule Place 1 capsule (18 mcg total) into inhaler and inhale daily. 30 capsule 12    Home: Home Living Family/patient expects to be discharged to:: Private residence Living Arrangements: Spouse/significant other Available Help at Discharge: Family, Available 24 hours/day Type of Home: House Home  Access: Stairs to enter [employer] of Steps: 2 Home Layout: One level Bathroom Shower/Tub: Optometrist: Yes Home Equipment: None  Functional History: Prior Function Level of Independence: Independent Comments: lives with wife, indpendent at [place] Functional Status:  Mobility: Bed Mobility Overal bed mobility: Needs Assistance Bed Mobility: Supine to Sit Supine to sit: Min assist General bed mobility comments: min A to supine<>sit Transfers Overall transfer level: Needs assistance Equipment used: Rolling walker (2 wheeled) Transfers: Sit to/from Stand Sit to Stand: Min assist General transfer comment: min A to stand and pivot to bed.  Ambulation/Gait General Gait Details: deferred at this time, patient transfered to transport bed, tolerating pericare EOB and becoming fatigued  before gait trial.     ADL:    Cognition: Cognition Overall Cognitive Status: Within Functional Limits for tasks assessed Orientation Level: Oriented X4 Cognition Arousal/Alertness: Awake/alert Behavior During Therapy: WFL for tasks assessed/performed Overall Cognitive Status: Within Functional Limits for tasks assessed  Blood pressure 133/63, pulse (!) 121, temperature 98.4 F (36.9 C), resp. rate (!) 21, height 5\' 11"  (1.803 m), weight 113.7 kg, SpO2 90 %. Physical Exam  Nursing note and vitals reviewed. Constitutional: No distress.  Obese male, lying in bed, NAD.   HENT:  Head: Normocephalic.  Cardiovascular:  Tachy: HR 120's at rest.   Respiratory: No stridor. No respiratory distress. He has wheezes.  GI: Soft. He exhibits no distension. There is no abdominal tenderness.  Musculoskeletal:     Comments: BUE 2+ edema, 1+ LE  Neurological:  Pt slow to arouse. Moves all 4's. Senses pinch.   Skin: Skin is warm. He is not diaphoretic.  Psychiatric:  Flat, disengaged    Results for orders placed or performed during the  hospital encounter of 03/03/19 (from the past 24 hour(s))  CBC     Status: Abnormal   Collection Time: 03/09/19  3:59 AM  Result Value Ref Range   WBC 7.6 4.0 - 10.5 K/uL   RBC 3.55 (L) 4.22 - 5.81 MIL/uL   Hemoglobin 11.6 (L) 13.0 - 17.0 g/dL   HCT 37.7 (L) 39.0 - 52.0 %   MCV 106.2 (H) 80.0 - 100.0 fL   MCH 32.7 26.0 - 34.0 pg   MCHC 30.8 30.0 - 36.0 g/dL   RDW 16.5 (H) 11.5 - 15.5 %   Platelets 261 150 - 400 K/uL   nRBC 0.0 0.0 - 0.2 %  Basic metabolic panel     Status: Abnormal   Collection Time: 03/09/19  3:59 AM  Result Value Ref Range   Sodium 145 135 - 145 mmol/L   Potassium 4.2 3.5 - 5.1 mmol/L   Chloride 114 (H) 98 - 111 mmol/L   CO2 21 (L) 22 - 32 mmol/L   Glucose, Bld 83 70 - 99  mg/dL   BUN 48 (H) 8 - 23 mg/dL   Creatinine, Ser 1.96 (H) 0.61 - 1.24 mg/dL   Calcium 8.8 (L) 8.9 - 10.3 mg/dL   GFR calc non Af Amer 31 (L) >60 mL/min   GFR calc Af Amer 36 (L) >60 mL/min   Anion gap 10 5 - 15   No results found.   Assessment/Plan: Diagnosis: debility due to pneumonia, multiple medical considerations  1. Does the need for close, 24 hr/day medical supervision in concert with the patient's rehab needs make it unreasonable for this patient to be served in a less intensive setting? Yes 2. Co-Morbidities requiring supervision/potential complications: COPD, CKD, etoh abuse, afib with RVR,  3. Due to bladder management, bowel management, safety, skin/wound care, disease management, medication administration, pain management and patient education, does the patient require 24 hr/day rehab nursing? Yes 4. Does the patient require coordinated care of a physician, rehab nurse, PT (1-2 hrs/day, 5 days/week) and OT (1-2 hrs/day, 5 days/week) to address physical and functional deficits in the context of the above medical diagnosis(es)? Yes Addressing deficits in the following areas: balance, endurance, locomotion, strength, transferring, bowel/bladder control, bathing, dressing, feeding,  grooming, toileting and psychosocial support 5. Can the patient actively participate in an intensive therapy program of at least 3 hrs of therapy per day at least 5 days per week? Yes 6. The potential for patient to make measurable gains while on inpatient rehab is good 7. Anticipated functional outcomes upon discharge from inpatient rehab are modified independent and supervision  with PT, modified independent and supervision with OT, n/a with SLP. 8. Estimated rehab length of stay to reach the above functional goals is: 7-12 days 9. Anticipated D/C setting: Home 10. Anticipated post D/C treatments: HH therapy and Outpatient therapy 11. Overall Rehab/Functional Prognosis: excellent  RECOMMENDATIONS: This patient's condition is appropriate for continued rehabilitative care in the following setting: CIR Patient has agreed to participate in recommended program. Yes Note that insurance prior authorization may be required for reimbursement for recommended care.  Comment: Rehab Admissions Coordinator to follow up.  Thanks,  Meredith Staggers, MD, Mellody Drown  I have personally performed a face to face diagnostic evaluation of this patient. Additionally, I have examined pertinent labs and radiographic images. I have reviewed and concur with the physician assistant's documentation above.    Bary Leriche, PA-C 03/09/2019

## 2019-03-09 NOTE — Progress Notes (Signed)
RN called d/t pt desat on 6 lpm [place].  Per RN, she has a VO from [facility] for HFNC.  Pt placed on 12 lpm HFNC, sat 95% . [facility] w/ fine crackles in bases, RN aware and states pt has had lasix.  Pt states he's feeling better on HFNC, no distress noted presently.

## 2019-03-09 NOTE — Progress Notes (Signed)
PROGRESS NOTE    David Barton  WUX:[id number] DOB: [birth year] DOA: 03/03/2019 PCP: Maury Dus, MD   Brief Narrative:  85 year old with history of COPD/emphysema, PSVT, essential hypertension, systolic CHF with ejection fraction 30-35%, alcohol abuse, colon cancer admitted from [facility] due to acute hypoxic respiratory failure and septic shock with acute kidney injury.  Initially requiring intubation and pressors.  He was started on broad-spectrum antibiotics.  Eventually extubated on 03/06/2019.  Influenza swab was negative.  But prior to that had been receiving Tamiflu.  There was a high suspicion for COVID19 which was ruled out.   Assessment & Plan:   Active Problems:   Sepsis ([place])  Acute hypoxic respiratory failure secondary to community-acquired pneumonia, streptococcal Abnormal breath sounds - Appears to be slowly improving.  Procalcitonin still slightly elevated.  Completed course of azithromycin, will continue IV Rocephin likely for another 24-48 hours.  Supplemental oxygen as needed, wean off oxygen. -Aggressive bronchodilator treatment, Pulmicort, incentive spirometry, flutter valve - IV Solu-Medrol.  Wean off oxygen as necessary.  Acute on chronic pressure congestive heart failure, ejection fraction 55%, class III Bilateral pleural effusion with infiltrate - Start patient on Lasix 80 mg IV every 8 hours.  Monitor input and output.  Stop IV fluids.  Supportive care.  Atrial fibrillation with mild RVR, new onset -Patient has been on Cardizem.  Increase 280 mg daily.  Already on Xarelto.  Acute mild to moderate COPD exacerbation -Continue current bronchodilators with IV Rocephin.  Will add IV Solu-Medrol as he is significantly wheezing-bronchospasm versus fluid overload.  Acute kidney injury likely secondary to ATN -Renal function continues to improve.  Currently trended down to 1.96.  Advised nursing staff to remove Foley catheter in place condom catheter.   Bilateral upper extremity swelling -Suspect dependent edema.  Advised to raise his arm and put pillows underneath it.  Generalized weakness -Physical therapy recommends CIR, their team has been consulted.  DVT prophylaxis: Xarelto Code Status: DNR Family Communication: Wife at bedside Disposition Plan: Maintain hospital stay until his breathing status has improved.  Currently at a very high risk for decompensation.  Antimicrobials:   Rocephin   Subjective: Reports he feels slightly better but still has significant amount of exertional dyspnea.  Also complains of bilateral upper extremity swelling.  Review of Systems Otherwise negative except as per HPI, including: General: Denies fever, chills, night sweats or unintended weight loss. Resp: Denies cough Cardiac: Denies chest pain, palpitations, orthopnea, paroxysmal nocturnal dyspnea. GI: Denies abdominal pain, nausea, vomiting, diarrhea or constipation GU: Denies dysuria, frequency, hesitancy or incontinence MS: Denies muscle aches, joint pain or swelling Neuro: Denies headache, neurologic deficits (focal weakness, numbness, tingling), abnormal gait Psych: Denies anxiety, depression, SI/HI/AVH Skin: Denies new rashes or lesions ID: Denies sick contacts, exotic exposures, travel  Objective: Vitals:   03/09/19 0509 03/09/19 0846 03/09/19 0856 03/09/19 1028  BP:    (!) 158/83  Pulse:      Resp:      Temp: 98.4 F (36.9 C)     TempSrc:      SpO2:  91% 95%   Weight: 113.7 kg     Height:        Intake/Output Summary (Last 24 hours) at 03/09/2019 1217 Last data filed at 03/09/2019 1031 Gross per 24 hour  Intake 809.69 ml  Output 1151 ml  Net -341.31 ml   Filed Weights   03/07/19 0355 03/08/19 0600 03/09/19 0509  Weight: 111.1 kg 111.9 kg 113.7 kg    Examination:  General exam: Appears calm and comfortable ; on 5L [place] Respiratory system: diffuse corase BS with exp wheezing.  Cardiovascular system: S1 & S2 heard,  RRR. No JVD, murmurs, rubs, gallops or clicks. No pedal edema. Gastrointestinal system: Abdomen is nondistended, soft and nontender. No organomegaly or masses felt. Normal bowel sounds heard. Central nervous system: Alert and oriented. No focal neurological deficits. Extremities: Symmetric 4 x 5 power. Skin: No rashes, lesions or ulcers Psychiatry: Judgement and insight appear normal. Mood & affect appropriate.  B/l UE swelling/arms.    Data Reviewed:   CBC: Recent Labs  Lab 03/03/19 0947  03/03/19 1409 03/04/19 0329 03/05/19 0317 03/05/19 0539 03/09/19 0359  WBC 29.4*  --  29.6* 25.0*  --  13.1* 7.6  NEUTROABS 25.3*  --   --   --   --   --   --   HGB 12.7*   < > 12.6* 11.3* 11.9* 11.6* 11.6*  HCT 42.2   < > 41.3 36.3* 35.0* 38.0* 37.7*  MCV 106.6*  --  107.6* 106.8*  --  107.0* 106.2*  PLT 229  --  231 232  --  186 261   < > = values in this interval not displayed.   Basic Metabolic Panel: Recent Labs  Lab 03/03/19 2337 03/04/19 0329 03/05/19 0317 03/05/19 0539 03/07/19 0929 03/09/19 0359  NA 139 141 142 143 144 145  K 5.4* 4.8 4.8 5.3* 4.3 4.2  CL 106 106  --  111 113* 114*  CO2 21* 23  --  23 26 21*  GLUCOSE 156* 162*  --  130* 134* 83  BUN 77* 80*  --  82* 62* 48*  CREATININE 4.31* 4.42*  --  3.62* 2.30* 1.96*  CALCIUM 7.1* 7.2*  --  7.5* 8.5* 8.8*  MG  --   --   --  2.0  --   --   PHOS  --   --   --  5.4*  --   --    GFR: Estimated Creatinine Clearance: 36.6 mL/min (A) (by C-G formula based on SCr of 1.96 mg/dL (H)). Liver Function Tests: Recent Labs  Lab 03/03/19 0947  AST 24  ALT 10  ALKPHOS 75  BILITOT 0.6  PROT 7.0  ALBUMIN 2.5*   No results for input(s): LIPASE, AMYLASE in the last 168 hours. No results for input(s): AMMONIA in the last 168 hours. Coagulation Profile: Recent Labs  Lab 03/04/19 0329  INR 1.2   Cardiac Enzymes: No results for input(s): CKTOTAL, CKMB, CKMBINDEX, TROPONINI in the last 168 hours. BNP (last 3 results) No  results for input(s): PROBNP in the last 8760 hours. HbA1C: No results for input(s): HGBA1C in the last 72 hours. CBG: Recent Labs  Lab 03/07/19 0003 03/07/19 0408 03/07/19 0806 03/07/19 1128 03/07/19 1550  GLUCAP 107* 96 89 112* 96   Lipid Profile: No results for input(s): CHOL, HDL, LDLCALC, TRIG, CHOLHDL, LDLDIRECT in the last 72 hours. Thyroid Function Tests: No results for input(s): TSH, T4TOTAL, FREET4, T3FREE, THYROIDAB in the last 72 hours. Anemia Panel: No results for input(s): VITAMINB12, FOLATE, FERRITIN, TIBC, IRON, RETICCTPCT in the last 72 hours. Sepsis Labs: Recent Labs  Lab 03/03/19 0947 03/03/19 1409 03/09/19 0359  PROCALCITON  --   --  2.87  LATICACIDVEN 1.6 2.2*  --     Recent Results (from the past 240 hour(s))  Respiratory Panel by PCR     Status: None   Collection Time: 03/03/19  9:23 AM  Result  Value Ref Range Status   Adenovirus NOT DETECTED NOT DETECTED Final   Coronavirus 229E NOT DETECTED NOT DETECTED Final    Comment: (NOTE) The Coronavirus on the Respiratory Panel, DOES NOT test for the novel  Coronavirus (2019 nCoV)    Coronavirus HKU1 NOT DETECTED NOT DETECTED Final   Coronavirus NL63 NOT DETECTED NOT DETECTED Final   Coronavirus OC43 NOT DETECTED NOT DETECTED Final   Metapneumovirus NOT DETECTED NOT DETECTED Final   Rhinovirus / Enterovirus NOT DETECTED NOT DETECTED Final   Influenza A NOT DETECTED NOT DETECTED Final   Influenza B NOT DETECTED NOT DETECTED Final   Parainfluenza Virus 1 NOT DETECTED NOT DETECTED Final   Parainfluenza Virus 2 NOT DETECTED NOT DETECTED Final   Parainfluenza Virus 3 NOT DETECTED NOT DETECTED Final   Parainfluenza Virus 4 NOT DETECTED NOT DETECTED Final   Respiratory Syncytial Virus NOT DETECTED NOT DETECTED Final   Bordetella pertussis NOT DETECTED NOT DETECTED Final   Chlamydophila pneumoniae NOT DETECTED NOT DETECTED Final   Mycoplasma pneumoniae NOT DETECTED NOT DETECTED Final    Comment: Performed  at [facility], [place] [address], [place], [place] [address]  Novel Coronavirus, NAA (hospital order; send-out to ref lab)     Status: None   Collection Time: 03/03/19  9:24 AM  Result Value Ref Range Status   SARS-CoV-2, NAA Not Detected Not Detected Final    Comment: (NOTE) Testing was performed using the cobas(R) SARS-CoV-2 test. This test was developed and its performance characteristics determined by [employer]. This test has not been FDA cleared or approved. This test has been authorized by FDA under an Emergency Use Authorization (EUA). This test is only authorized for the duration of time the declaration that circumstances exist justifying the authorization of the emergency use of in vitro diagnostic tests for detection of SARS-CoV-2 virus and/or diagnosis of COVID-19 infection under section 564(b)(1) of the Act, 21 U.S.C. 786VEH-2(C)(9), unless the authorization is terminated or revoked sooner. Performed At: [facility] [address] [place], [place] [id number] Rush Farmer MD OQ:[id number]    Coronavirus Source NASAL SWAB  Final    Comment: Performed at [facility], [place] [address], [place], [place] [address]  Blood Culture (routine x 2)     Status: None   Collection Time: 03/03/19  9:47 AM  Result Value Ref Range Status   Specimen Description BLOOD RIGHT HAND  Final   Special Requests   Final    BOTTLES DRAWN AEROBIC AND ANAEROBIC Blood Culture adequate volume   Culture   Final    NO GROWTH 5 DAYS Performed at [facility], [place] [address] [place], [place] [address]    Report Status 03/08/2019 FINAL  Final  Blood Culture (routine x 2)     Status: None   Collection Time: 03/03/19 10:38 AM  Result Value Ref Range Status   Specimen Description BLOOD LEFT HAND  Final   Special Requests AEROBIC BOTTLE ONLY Blood Culture adequate volume  Final   Culture   Final    NO GROWTH 5 DAYS Performed at [facility], [place] [address], [place], [place] [address]    Report Status 03/08/2019 FINAL  Final  MRSA PCR Screening     Status: None   Collection Time: 03/03/19  3:59 PM  Result Value Ref Range Status   MRSA by PCR NEGATIVE NEGATIVE Final    Comment:        The GeneXpert MRSA  Assay (FDA approved for NASAL specimens only), is one component of a comprehensive MRSA colonization surveillance program. It is not intended to diagnose MRSA infection nor to guide or monitor treatment for MRSA infections. Performed at [facility], [place] [address] [place], [place] [address]   Culture, respiratory (non-expectorated)     Status: None   Collection Time: 03/03/19  6:38 PM  Result Value Ref Range Status   Specimen Description TRACHEAL ASPIRATE  Final   Special Requests NONE  Final   Gram Stain   Final    RARE WBC PRESENT, PREDOMINANTLY PMN FEW GRAM POSITIVE COCCI IN PAIRS Performed at [facility], [place] [address], [place], [place] [address]    Culture FEW STREPTOCOCCUS PNEUMONIAE  Final   Report Status 03/06/2019 FINAL  Final   Organism ID, Bacteria STREPTOCOCCUS PNEUMONIAE  Final      Susceptibility   Streptococcus pneumoniae - MIC*    ERYTHROMYCIN >=8 RESISTANT Resistant     LEVOFLOXACIN 1 SENSITIVE Sensitive     VANCOMYCIN 0.5 SENSITIVE Sensitive     PENICILLIN (non-meningitis) 1 SENSITIVE Sensitive     CEFTRIAXONE (non-meningitis) 1 SENSITIVE Sensitive     * FEW STREPTOCOCCUS PNEUMONIAE         Radiology Studies: Dg Chest Port 1 View  Result Date: 03/09/2019 CLINICAL DATA:  Shortness of breath. EXAM: PORTABLE CHEST 1 VIEW COMPARISON:  03/05/2019. FINDINGS: Cardiomegaly with diffuse bilateral pulmonary infiltrates/edema and bilateral pleural effusions. No pneumothorax. IMPRESSION: Cardiomegaly with diffuse bilateral pulmonary infiltrates/edema and bilateral pleural effusions. Similar findings noted on prior exam. Findings consistent with CHF. Electronically Signed   By: Marcello Moores  Register   On:  03/09/2019 12:03        Scheduled Meds: . arformoterol  15 mcg Nebulization BID  . budesonide (PULMICORT) nebulizer solution  0.25 mg Nebulization BID  . budesonide (PULMICORT) nebulizer solution  0.5 mg Nebulization BID  . [START ON 03/10/2019] diltiazem  180 mg Oral Daily  . diltiazem  60 mg Oral Q6H  . docusate sodium  100 mg Oral BID  . feeding supplement (ENSURE ENLIVE)  237 mL Oral BID BM  . furosemide  40 mg Intravenous Q8H  . ipratropium  0.5 mg Nebulization BID  . levalbuterol  0.63 mg Nebulization Q8H  . mouth rinse  15 mL Mouth Rinse BID  . methylPREDNISolone (SOLU-MEDROL) injection  40 mg Intravenous Q8H  . rivaroxaban  15 mg Oral Q supper   Continuous Infusions:   LOS: 6 days   Time spent= 35 mins    Izayah Miner Arsenio Loader, MD [facility]  If 7PM-7AM, please contact night-coverage [email or web address] 03/09/2019, 12:17 PM

## 2019-03-10 DIAGNOSIS — R0902 Hypoxemia: Secondary | ICD-10-CM

## 2019-03-10 DIAGNOSIS — J069 Acute upper respiratory infection, unspecified: Secondary | ICD-10-CM

## 2019-03-10 DIAGNOSIS — I5031 Acute diastolic (congestive) heart failure: Secondary | ICD-10-CM

## 2019-03-10 LAB — BASIC METABOLIC PANEL
Anion gap: 6 (ref 5–15)
Anion gap: 10 (ref 5–15)
BUN: 51 mg/dL — ABNORMAL HIGH (ref 8–23)
BUN: 55 mg/dL — ABNORMAL HIGH (ref 8–23)
CO2: 25 mmol/L (ref 22–32)
CO2: 26 mmol/L (ref 22–32)
Calcium: 8.9 mg/dL (ref 8.9–10.3)
Calcium: 9.4 mg/dL (ref 8.9–10.3)
Chloride: 112 mmol/L — ABNORMAL HIGH (ref 98–111)
Chloride: 108 mmol/L (ref 98–111)
Creatinine, Ser: 2.1 mg/dL — ABNORMAL HIGH (ref 0.61–1.24)
Creatinine, Ser: 2.42 mg/dL — ABNORMAL HIGH (ref 0.61–1.24)
GFR calc Af Amer: 33 mL/min — ABNORMAL LOW (ref >60)
GFR calc Af Amer: 28 mL/min — ABNORMAL LOW (ref >60)
GFR calc non Af Amer: 28 mL/min — ABNORMAL LOW (ref >60)
GFR calc non Af Amer: 24 mL/min — ABNORMAL LOW (ref >60)
Glucose, Bld: 233 mg/dL — ABNORMAL HIGH (ref 70–99)
Glucose, Bld: 226 mg/dL — ABNORMAL HIGH (ref 70–99)
POTASSIUM: 4.6 mmol/L (ref 3.5–5.1)
Potassium: 4.2 mmol/L (ref 3.5–5.1)
Sodium: 143 mmol/L (ref 135–145)
Sodium: 144 mmol/L (ref 135–145)

## 2019-03-10 LAB — VITAMIN B12: Vitamin B-12: 929 pg/mL — ABNORMAL HIGH (ref 180–914)

## 2019-03-10 LAB — CBC
HCT: 38.8 % — ABNORMAL LOW (ref 39.0–52.0)
Hemoglobin: 11.6 g/dL — ABNORMAL LOW (ref 13.0–17.0)
MCH: 31.7 pg (ref 26.0–34.0)
MCHC: 29.9 g/dL — ABNORMAL LOW (ref 30.0–36.0)
MCV: 106 fL — ABNORMAL HIGH (ref 80.0–100.0)
Platelets: 259 10*3/uL (ref 150–400)
RBC: 3.66 MIL/uL — ABNORMAL LOW (ref 4.22–5.81)
RDW: 16 % — ABNORMAL HIGH (ref 11.5–15.5)
WBC: 4.9 10*3/uL (ref 4.0–10.5)
nRBC: 0 % (ref 0.0–0.2)

## 2019-03-10 LAB — MAGNESIUM
Magnesium: 1.8 mg/dL (ref 1.7–2.4)
Magnesium: 1.6 mg/dL — ABNORMAL LOW (ref 1.7–2.4)

## 2019-03-10 MED ORDER — FUROSEMIDE 10 MG/ML IJ SOLN
40.0000 mg | Freq: Three times a day (TID) | INTRAMUSCULAR | Status: AC
  Administered 2019-03-10 – 2019-03-11 (×3): 40 mg via INTRAVENOUS
  Filled 2019-03-10 (×3): qty 4

## 2019-03-10 NOTE — Progress Notes (Signed)
PROGRESS NOTE    David Barton  XTG:[id number] DOB: [birth year] DOA: 03/03/2019 PCP: Maury Dus, MD   Brief Narrative:  85 year old with history of COPD/emphysema, PSVT, essential hypertension, systolic CHF with ejection fraction 30-35%, alcohol abuse, colon cancer admitted from [facility] due to acute hypoxic respiratory failure and septic shock with acute kidney injury.  Initially requiring intubation and pressors.  He was started on broad-spectrum antibiotics.  Eventually extubated on 03/06/2019.  Influenza swab was negative.  But prior to that had been receiving Tamiflu.  There was a high suspicion for COVID19 which was ruled out.    Assessment & Plan:   Active Problems:   Sepsis ([place])  Acute hypoxic respiratory failure secondary to community-acquired pneumonia, streptococcal; still on 6HF [place] Abnormal breath sounds - Slow to improve. ProCal- 2.87. BNP slightly elevated.  Completed course of azithromycin, will continue IV Rocephin likely for another 24-48 hours.  Supplemental oxygen as needed, wean off oxygen. -Aggressive bronchodilator treatment, Pulmicort, IS/Flutter. - IV Solu-Medrol.  Wean off oxygen as necessary.  Acute on chronic pressure congestive heart failure, ejection fraction 55%, class III Bilateral pleural effusion with infiltrate -Cont Lasix 80mg  iv.  Monitor input and output.  Stop IV fluids.  Supportive care.  Atrial fibrillation with mild RVR, new onset -Cont Cardizem 180mg  po daily .  on Xarelto.  Acute mild to moderate COPD exacerbation -Continue current bronchodilators with IV Rocephin.  Cont Solumedrol.  Acute kidney injury likely secondary to ATN -Renal function continues to improve.  Currently trended down to 1.96.  Advised nursing staff to remove Foley catheter in place condom catheter.  Bilateral upper extremity swelling -Suspect dependent edema.  Advised to raise his arm and put pillows underneath it.  Generalized weakness -Physical therapy  recommends CIR, their team has been consulted.  DVT prophylaxis: Xarelto Code Status: DNR Family Communication: Wife at bedside Disposition Plan: Maintain hospital stay with current aggressive treatment for his breathing, in the meantime CIR evaluation  Antimicrobials:   Rocephin   Subjective: Feels ok. No complaints.   Review of Systems Otherwise negative except as per HPI, including: General = no fevers, chills, dizziness, malaise, fatigue HEENT/EYES = negative for pain, redness, loss of vision, double vision, blurred vision, loss of hearing, sore throat, hoarseness, dysphagia Cardiovascular= negative for chest pain, palpitation, murmurs, lower extremity swelling Respiratory/lungs= negative for  mucus production Gastrointestinal= negative for nausea, vomiting,, abdominal pain, melena, hematemesis Genitourinary= negative for Dysuria, Hematuria, Change in Urinary Frequency MSK = Negative for arthralgia, myalgias, Back Pain, Joint swelling  Neurology= Negative for headache, seizures, numbness, tingling  Psychiatry= Negative for anxiety, depression, suicidal and homocidal ideation Allergy/Immunology= Medication/Food allergy as listed  Skin= Negative for Rash, lesions, ulcers, itching   Objective: Vitals:   03/10/19 0739 03/10/19 0759 03/10/19 0800 03/10/19 0925  BP:  (!) 150/79 (!) 150/79 138/79  Pulse:   85   Resp:   (!) 25   Temp:  (!) 96.9 F (36.1 C) (!) 97.5 F (36.4 C)   TempSrc:  Axillary Oral   SpO2: 92%  93%   Weight:      Height:        Intake/Output Summary (Last 24 hours) at 03/10/2019 1048 Last data filed at 03/10/2019 0603 Gross per 24 hour  Intake -  Output 675 ml  Net -675 ml   Filed Weights   03/08/19 0600 03/09/19 0509 03/10/19 0500  Weight: 111.9 kg 113.7 kg 113.6 kg    Examination: Constitutional: NAD, calm, comfortable 6 L nasal  cannula Eyes: PERRL, lids and conjunctivae normal ENMT: Mucous membranes are moist. Posterior pharynx clear of  any exudate or lesions.Normal dentition.  Neck: normal, supple, no masses, no thyromegaly Respiratory: Bilateral diffuse coarse breath sounds Cardiovascular: Regular rate and rhythm, no murmurs / rubs / gallops. No extremity edema. 2+ pedal pulses. No carotid bruits.  Abdomen: no tenderness, no masses palpated. No hepatosplenomegaly. Bowel sounds positive.  Musculoskeletal: no clubbing / cyanosis. No joint deformity upper and lower extremities. Good ROM, no contractures. Normal muscle tone.  Skin: no rashes, lesions, ulcers. No induration Neurologic: CN 2-12 grossly intact. Sensation intact, DTR normal. Strength 3/5 in all 4.  Psychiatric: Normal judgment and insight. Alert and oriented x 3. Normal mood.   Data Reviewed:   CBC: Recent Labs  Lab 03/03/19 1409 03/04/19 0329 03/05/19 0317 03/05/19 0539 03/09/19 0359  WBC 29.6* 25.0*  --  13.1* 7.6  HGB 12.6* 11.3* 11.9* 11.6* 11.6*  HCT 41.3 36.3* 35.0* 38.0* 37.7*  MCV 107.6* 106.8*  --  107.0* 106.2*  PLT 231 232  --  186 831   Basic Metabolic Panel: Recent Labs  Lab 03/03/19 2337 03/04/19 0329 03/05/19 0317 03/05/19 0539 03/07/19 0929 03/09/19 0359  NA 139 141 142 143 144 145  K 5.4* 4.8 4.8 5.3* 4.3 4.2  CL 106 106  --  111 113* 114*  CO2 21* 23  --  23 26 21*  GLUCOSE 156* 162*  --  130* 134* 83  BUN 77* 80*  --  82* 62* 48*  CREATININE 4.31* 4.42*  --  3.62* 2.30* 1.96*  CALCIUM 7.1* 7.2*  --  7.5* 8.5* 8.8*  MG  --   --   --  2.0  --   --   PHOS  --   --   --  5.4*  --   --    GFR: Estimated Creatinine Clearance: 36.6 mL/min (A) (by C-G formula based on SCr of 1.96 mg/dL (H)). Liver Function Tests: No results for input(s): AST, ALT, ALKPHOS, BILITOT, PROT, ALBUMIN in the last 168 hours. No results for input(s): LIPASE, AMYLASE in the last 168 hours. No results for input(s): AMMONIA in the last 168 hours. Coagulation Profile: Recent Labs  Lab 03/04/19 0329  INR 1.2   Cardiac Enzymes: No results for  input(s): CKTOTAL, CKMB, CKMBINDEX, TROPONINI in the last 168 hours. BNP (last 3 results) No results for input(s): PROBNP in the last 8760 hours. HbA1C: No results for input(s): HGBA1C in the last 72 hours. CBG: Recent Labs  Lab 03/07/19 0003 03/07/19 0408 03/07/19 0806 03/07/19 1128 03/07/19 1550  GLUCAP 107* 96 89 112* 96   Lipid Profile: No results for input(s): CHOL, HDL, LDLCALC, TRIG, CHOLHDL, LDLDIRECT in the last 72 hours. Thyroid Function Tests: No results for input(s): TSH, T4TOTAL, FREET4, T3FREE, THYROIDAB in the last 72 hours. Anemia Panel: No results for input(s): VITAMINB12, FOLATE, FERRITIN, TIBC, IRON, RETICCTPCT in the last 72 hours. Sepsis Labs: Recent Labs  Lab 03/03/19 1409 03/09/19 0359  PROCALCITON  --  2.87  LATICACIDVEN 2.2*  --     Recent Results (from the past 240 hour(s))  Respiratory Panel by PCR     Status: None   Collection Time: 03/03/19  9:23 AM  Result Value Ref Range Status   Adenovirus NOT DETECTED NOT DETECTED Final   Coronavirus 229E NOT DETECTED NOT DETECTED Final    Comment: (NOTE) The Coronavirus on the Respiratory Panel, DOES NOT test for the novel  Coronavirus (2019 nCoV)  Coronavirus HKU1 NOT DETECTED NOT DETECTED Final   Coronavirus NL63 NOT DETECTED NOT DETECTED Final   Coronavirus OC43 NOT DETECTED NOT DETECTED Final   Metapneumovirus NOT DETECTED NOT DETECTED Final   Rhinovirus / Enterovirus NOT DETECTED NOT DETECTED Final   Influenza A NOT DETECTED NOT DETECTED Final   Influenza B NOT DETECTED NOT DETECTED Final   Parainfluenza Virus 1 NOT DETECTED NOT DETECTED Final   Parainfluenza Virus 2 NOT DETECTED NOT DETECTED Final   Parainfluenza Virus 3 NOT DETECTED NOT DETECTED Final   Parainfluenza Virus 4 NOT DETECTED NOT DETECTED Final   Respiratory Syncytial Virus NOT DETECTED NOT DETECTED Final   Bordetella pertussis NOT DETECTED NOT DETECTED Final   Chlamydophila pneumoniae NOT DETECTED NOT DETECTED Final    Mycoplasma pneumoniae NOT DETECTED NOT DETECTED Final    Comment: Performed at [facility], [place] [address], [place], [place] [address]  Novel Coronavirus, NAA (hospital order; send-out to ref lab)     Status: None   Collection Time: 03/03/19  9:24 AM  Result Value Ref Range Status   SARS-CoV-2, NAA Not Detected Not Detected Final    Comment: (NOTE) Testing was performed using the cobas(R) SARS-CoV-2 test. This test was developed and its performance characteristics determined by [employer]. This test has not been FDA cleared or approved. This test has been authorized by FDA under an Emergency Use Authorization (EUA). This test is only authorized for the duration of time the declaration that circumstances exist justifying the authorization of the emergency use of in vitro diagnostic tests for detection of SARS-CoV-2 virus and/or diagnosis of COVID-19 infection under section 564(b)(1) of the Act, 21 U.S.C. 761YWV-3(X)(1), unless the authorization is terminated or revoked sooner. Performed At: [facility] [address] [place], [place] [id number] Rush Farmer MD OE:[id number]    Coronavirus Source NASAL SWAB  Final    Comment: Performed at [facility], [place] [address] [place], [place] [address]  Blood Culture (routine x 2)     Status: None   Collection Time: 03/03/19  9:47 AM  Result Value Ref Range Status   Specimen Description BLOOD RIGHT HAND  Final   Special Requests   Final    BOTTLES DRAWN AEROBIC AND ANAEROBIC Blood Culture adequate volume   Culture   Final    NO GROWTH 5 DAYS Performed at [facility], [place] [address] [place], [place] [address]    Report Status 03/08/2019 FINAL  Final  Blood Culture (routine x 2)     Status: None   Collection Time: 03/03/19 10:38 AM  Result Value Ref Range Status   Specimen Description BLOOD LEFT HAND  Final   Special Requests AEROBIC BOTTLE ONLY Blood Culture adequate volume  Final   Culture    Final    NO GROWTH 5 DAYS Performed at [facility], [place] [address] [place], [place] [address]    Report Status 03/08/2019 FINAL  Final  MRSA PCR Screening     Status: None   Collection Time: 03/03/19  3:59 PM  Result Value Ref Range Status   MRSA by PCR NEGATIVE NEGATIVE Final    Comment:        The GeneXpert MRSA Assay (FDA approved for NASAL specimens only), is one component of a comprehensive MRSA colonization surveillance program. It is not intended to diagnose MRSA infection nor to guide or monitor treatment for MRSA infections. Performed at [facility], [place],  [place] [address]   Culture, respiratory (non-expectorated)     Status: None   Collection Time: 03/03/19  6:38 PM  Result Value Ref Range Status   Specimen Description TRACHEAL ASPIRATE  Final   Special Requests NONE  Final   Gram Stain   Final    RARE WBC PRESENT, PREDOMINANTLY PMN FEW GRAM POSITIVE COCCI IN PAIRS Performed at [facility], [place] [address] [place], [place] [address]    Culture FEW STREPTOCOCCUS PNEUMONIAE  Final   Report Status 03/06/2019 FINAL  Final   Organism ID, Bacteria STREPTOCOCCUS PNEUMONIAE  Final      Susceptibility   Streptococcus pneumoniae - MIC*    ERYTHROMYCIN >=8 RESISTANT Resistant     LEVOFLOXACIN 1 SENSITIVE Sensitive     VANCOMYCIN 0.5 SENSITIVE Sensitive     PENICILLIN (non-meningitis) 1 SENSITIVE Sensitive     CEFTRIAXONE (non-meningitis) 1 SENSITIVE Sensitive     * FEW STREPTOCOCCUS PNEUMONIAE         Radiology Studies: Dg Chest Port 1 View  Result Date: 03/09/2019 CLINICAL DATA:  Shortness of breath. EXAM: PORTABLE CHEST 1 VIEW COMPARISON:  03/05/2019. FINDINGS: Cardiomegaly with diffuse bilateral pulmonary infiltrates/edema and bilateral pleural effusions. No pneumothorax. IMPRESSION: Cardiomegaly with diffuse bilateral pulmonary infiltrates/edema and bilateral pleural effusions. Similar findings noted on prior exam.  Findings consistent with CHF. Electronically Signed   By: Marcello Moores  Register   On: 03/09/2019 12:03        Scheduled Meds: . arformoterol  15 mcg Nebulization BID  . budesonide (PULMICORT) nebulizer solution  0.5 mg Nebulization BID  . diltiazem  180 mg Oral Daily  . docusate sodium  100 mg Oral BID  . feeding supplement (ENSURE ENLIVE)  237 mL Oral BID BM  . furosemide  40 mg Intravenous Q8H  . ipratropium  0.5 mg Nebulization BID  . levalbuterol  0.63 mg Nebulization TID  . mouth rinse  15 mL Mouth Rinse BID  . methylPREDNISolone (SOLU-MEDROL) injection  40 mg Intravenous Q8H  . rivaroxaban  15 mg Oral Q supper   Continuous Infusions:   LOS: 7 days   Time spent= 35 mins    Ankit Arsenio Loader, MD [facility]  If 7PM-7AM, please contact night-coverage [email or web address] 03/10/2019, 10:48 AM

## 2019-03-10 NOTE — Progress Notes (Signed)
Physical Therapy Treatment Patient Details Name: David Barton MRN: [id number] DOB: 1933/08/05 Today's Date: 03/10/2019    History of Present Illness 85 year old man prior tobacco, psvt, systolic hf ef 40-34% with normalization to 50's osa not on therapy, with a 4-day history of increasing shortness of breath with productive cough.  Flu swab negative at his physician's office on 3/12.  On Tamiflu empirically.  Developed fever and rigors 3/13 and was increasingly short of breath and altered this morning prompting his wife to call EMS. Prior history of pneumonia on background of COPD from remote smoking. No recent travel, no sick contacts.    PT Comments    Patient seen for mobility progression. Pt very eager to participate in therapy and ready to mobilize in arrival. Pt requires min guard/min A for functional transfers and short distance gait training. Pt tolerated ambulating 16 ft before fatigued and SOB. SpO2 desat to 84% with mobility while on 10L O2 via [place]. Continue to recommend CIR for further skilled PT services to maximize independence and safety with mobility.   Follow Up Recommendations  CIR     Equipment Recommendations  (TBD)    Recommendations for Other Services       Precautions / Restrictions Precautions Precautions: Fall Restrictions Weight Bearing Restrictions: No    Mobility  Bed Mobility Overal bed mobility: Needs Assistance Bed Mobility: Supine to Sit     Supine to sit: Min assist     General bed mobility comments: assist to elevate trunk into sitting and use of rail  Transfers Overall transfer level: Needs assistance Equipment used: Rolling walker (2 wheeled) Transfers: Sit to/from Stand Sit to Stand: Min guard         General transfer comment: min guard for safety; cues for safe hand placement  Ambulation/Gait Ambulation/Gait assistance: Min assist Gait Distance (Feet): 16 Feet Assistive device: Rolling walker (2 wheeled) Gait  Pattern/deviations: Step-through pattern;Decreased step length - right;Decreased step length - left;Trunk flexed Gait velocity: decreased   General Gait Details: cues for pursed lip breathing, posture, and safe use of AD   Stairs             Wheelchair Mobility    Modified Rankin (Stroke Patients Only)       Balance Overall balance assessment: Needs assistance Sitting-balance support: Feet supported Sitting balance-Leahy Scale: Fair     Standing balance support: Bilateral upper extremity supported Standing balance-Leahy Scale: Poor                              Cognition Arousal/Alertness: Awake/alert Behavior During Therapy: WFL for tasks assessed/performed Overall Cognitive Status: Within Functional Limits for tasks assessed                                        Exercises      General Comments General comments (skin integrity, edema, etc.): pt on 10L O2 via [place] for mobility and 8L at rest; SpO2 desat to 84% while mobilizing and back to 90% at rest      Pertinent Vitals/Pain Pain Assessment: No/denies pain Faces Pain Scale: Hurts a little bit Pain Location: general  Pain Descriptors / Indicators: Aching Pain Intervention(s): Limited activity within patient's tolerance;Monitored during session;Repositioned    Home Living Family/patient expects to be discharged to:: Private residence Living Arrangements: Spouse/significant other Available Help at Discharge: Family;Available 24  hours/day Type of Home: House Home Access: Stairs to enter   Home Layout: One level Home Equipment: Environmental consultant - 2 wheels;Shower seat      Prior Function Level of Independence: Independent      Comments: lives with wife, indpendent at [place]   PT Goals (current goals can now be found in the care plan section) Acute Rehab PT Goals Patient Stated Goal: get stronger Progress towards PT goals: Progressing toward goals    Frequency    Min  3X/week      PT Plan Current plan remains appropriate    Co-evaluation              AM-PAC PT "6 Clicks" Mobility   Outcome Measure  Help needed turning from your back to your side while in a flat bed without using bedrails?: A Little Help needed moving from lying on your back to sitting on the side of a flat bed without using bedrails?: A Lot Help needed moving to and from a bed to a chair (including a wheelchair)?: A Little Help needed standing up from a chair using your arms (e.g., wheelchair or bedside chair)?: A Little Help needed to walk in hospital room?: A Little Help needed climbing 3-5 steps with a railing? : A Lot 6 Click Score: 16    End of Session Equipment Utilized During Treatment: Gait belt Activity Tolerance: Patient tolerated treatment well Patient left: with call bell/phone within reach;in chair Nurse Communication: Mobility status PT Visit Diagnosis: Unsteadiness on feet (R26.81)     Time: 9012-2241 PT Time Calculation (min) (ACUTE ONLY): 35 min  Charges:  $Gait Training: 23-37 mins                     David Barton, PTA Acute Rehabilitation Services Pager: [phone] Office: [phone]     Darliss Cheney 03/10/2019, 5:01 PM

## 2019-03-10 NOTE — Evaluation (Signed)
Occupational Therapy Evaluation Patient Details Name: David Barton MRN: [id number] DOB: 1933/03/12 Today's Date: 03/10/2019    History of Present Illness 85 year old man prior tobacco, psvt, systolic hf ef 25-36% with normalization to 50's osa not on therapy, with a 4-day history of increasing shortness of breath with productive cough.  Flu swab negative at his physician's office on 3/12.  On Tamiflu empirically.  Developed fever and rigors 3/13 and was increasingly short of breath and altered this morning prompting his wife to call EMS. Prior history of pneumonia on background of COPD from remote smoking. No recent travel, no sick contacts.   Clinical Impression   Pt admitted with psvt, systolic hf ef 64-40% with normalization to 50's osa not on therapy, with a 4-day history of increasing shortness of breath with productive cough. Pt currently with functional limitations due to the deficits listed below (see OT Problem List). Pt was living at home and independent in ADLs. Pt requires cues for safety and pacing. Pt requires min guard with transfer. Pt requires RW and min guard to transfer to 3 in 1 commode. Pt will benefit from skilled OT to increase their safety and independence with ADL and functional mobility for ADL to facilitate discharge to venue listed below.      Follow Up Recommendations  SNF;Supervision/Assistance - 24 hour    Equipment Recommendations       Recommendations for Other Services       Precautions / Restrictions Precautions Precautions: Fall Restrictions Weight Bearing Restrictions: No      Mobility Bed Mobility Overal bed mobility: (presented in chair)                Transfers Overall transfer level: Needs assistance Equipment used: Rolling walker (2 wheeled) Transfers: Sit to/from Stand Sit to Stand: Min guard         General transfer comment: min guard to 3 in1 and back to chair     Balance Overall balance assessment: Needs  assistance Sitting-balance support: Feet supported Sitting balance-Leahy Scale: Fair     Standing balance support: Bilateral upper extremity supported Standing balance-Leahy Scale: Poor                             ADL either performed or assessed with clinical judgement   ADL Overall ADL's : Needs assistance/impaired Eating/Feeding: Independent;Sitting   Grooming: Wash/dry hands;Wash/dry face;Sitting;Set up   Upper Body Bathing: Minimal assistance;Sitting   Lower Body Bathing: Moderate assistance;Cueing for safety;Cueing for sequencing;Cueing for compensatory techniques;Sit to/from stand   Upper Body Dressing : Minimal assistance;Cueing for safety;Cueing for sequencing;Sitting   Lower Body Dressing: Moderate assistance;Sit to/from stand   Toilet Transfer: Min guard;Cueing for safety;Cueing for sequencing(3 in 1 commode)   Toileting- Clothing Manipulation and Hygiene: Moderate assistance;Cueing for safety;Cueing for sequencing;Sit to/from stand   Tub/ Banker: Moderate assistance;Cueing for safety;Cueing for sequencing;Rolling walker   Functional mobility during ADLs: Min guard;Cueing for safety;Cueing for sequencing;Rolling walker       Vision Baseline Vision/History: No visual deficits Vision Assessment?: No apparent visual deficits     Perception Perception Perception Tested?: No   Praxis Praxis Praxis tested?: Not tested    Pertinent Vitals/Pain Pain Assessment: Faces Faces Pain Scale: Hurts a little bit Pain Location: general  Pain Descriptors / Indicators: Aching Pain Intervention(s): Limited activity within patient's tolerance;Monitored during session;Repositioned     Hand Dominance Right   Extremity/Trunk Assessment Upper Extremity Assessment Upper Extremity Assessment: RUE  deficits/detail RUE Deficits / Details: pt reported have chronic limites decrease ROM and will not have sx RUE Sensation: WNL RUE Coordination: WNL   Lower  Extremity Assessment Lower Extremity Assessment: Defer to PT evaluation       Communication Communication Communication: No difficulties   Cognition Arousal/Alertness: Awake/alert Behavior During Therapy: WFL for tasks assessed/performed Overall Cognitive Status: Within Functional Limits for tasks assessed                                     General Comments       Exercises     Shoulder Instructions      Home Living Family/patient expects to be discharged to:: Private residence Living Arrangements: Spouse/significant other Available Help at Discharge: Family;Available 24 hours/day Type of Home: House Home Access: Stairs to enter [employer] of Steps: 2   Home Layout: One level     Bathroom Shower/Tub: Teacher, early years/pre: Standard Bathroom Accessibility: Yes How Accessible: Accessible via walker Home Equipment: Sparkman - 2 wheels;Shower seat          Prior Functioning/Environment Level of Independence: Independent        Comments: lives with wife, indpendent at [place]        OT Problem List: Decreased strength;Decreased activity tolerance;Decreased safety awareness;Pain;Impaired UE functional use      OT Treatment/Interventions: Self-care/ADL training;Therapeutic exercise;Energy conservation;DME and/or AE instruction;Therapeutic activities;Patient/family education;Balance training    OT Goals(Current goals can be found in the care plan section) Acute Rehab OT Goals Patient Stated Goal: get stronger OT Goal Formulation: With patient Time For Goal Achievement: 03/17/19 Potential to Achieve Goals: Good  OT Frequency: Min 2X/week   Barriers to D/C:            Co-evaluation              AM-PAC OT "6 Clicks" Daily Activity     Outcome Measure Help from another person eating meals?: None Help from another person taking care of personal grooming?: A Little Help from another person toileting, which includes  using toliet, bedpan, or urinal?: A Lot Help from another person bathing (including washing, rinsing, drying)?: A Lot Help from another person to put on and taking off regular upper body clothing?: A Lot Help from another person to put on and taking off regular lower body clothing?: A Lot 6 Click Score: 15   End of Session Equipment Utilized During Treatment: Gait belt;Rolling walker  Activity Tolerance: Patient limited by fatigue Patient left: in chair;with call bell/phone within reach;with chair alarm set  OT Visit Diagnosis: Unsteadiness on feet (R26.81);Repeated falls (R29.6);Pain Pain - part of body: (general)                Time: 4656-8127 OT Time Calculation (min): 95 min Charges:  OT General Charges $OT Visit: 1 Visit OT Evaluation $OT Eval Low Complexity: 1 Low OT Treatments $Self Care/Home Management : 68-82 mins  Joeseph Amor OTR/L  Acute Rehab Services  [phone] office number [phone] pager number   Joeseph Amor 03/10/2019, 4:51 PM

## 2019-03-10 NOTE — Progress Notes (Addendum)
ANTICOAGULATION CONSULT NOTE - follow-up consult  Pharmacy Consult for Xarelto Indication: atrial fibrillation  Allergies  Allergen Reactions  . Flexeril [Cyclobenzaprine] Other (See Comments)    Unknown reaction     Patient Measurements: Height: 5\' 11"  (180.3 cm) Weight: 250 lb 7.1 oz (113.6 kg) IBW/kg (Calculated) : 75.3  Vital Signs: Temp: 97.5 F (36.4 C) (03/21 0800) Temp Source: Oral (03/21 0800) BP: 138/79 (03/21 0925) Pulse Rate: 85 (03/21 0800)  Labs: Recent Labs    03/09/19 0359 03/10/19 1130  HGB 11.6* 11.6*  HCT 37.7* 38.8*  PLT 261 259  CREATININE 1.96* 2.10*    Estimated Creatinine Clearance: 34.2 mL/min (A) (by C-G formula based on SCr of 2.1 mg/dL (H)).   Medical History: Past Medical History:  Diagnosis Date  . Alcohol abuse   . Colon cancer ([place])   . COPD (chronic obstructive pulmonary disease) ([place])   . Emphysema of lung ([place])   . Former tobacco use   . Hypertension   . Peripheral edema   . Sepsis ([place]) 10/2016   Aspiration PNA/C diff colitis    Medications:  Medications Prior to Admission  Medication Sig Dispense Refill Last Dose  . acetaminophen (TYLENOL) 500 MG tablet Take 1,000 mg by mouth every 6 (six) hours as needed for fever.   03/02/2019 at Unknown time  . allopurinol (ZYLOPRIM) 100 MG tablet Take 100 mg by mouth daily.   03/02/2019 at am  . aspirin EC 81 MG tablet Take 81 mg by mouth daily.    03/02/2019 at am  . bisoprolol (ZEBETA) 5 MG tablet Take 5 mg by mouth daily.   03/02/2019 at 11a-12p  . ferrous sulfate 325 (65 FE) MG tablet Take 325 mg by mouth daily.    03/02/2019 at am  . furosemide (LASIX) 20 MG tablet Take 1 tablet (20 mg total) by mouth daily. 90 tablet 3 03/02/2019 at am  . HYDROcodone-homatropine (HYDROMET) 5-1.5 MG/5ML syrup Take 5 mLs by mouth every 6 (six) hours as needed for cough.   03/02/2019 at Unknown time  . Multiple Vitamin (MULTIVITAMIN WITH MINERALS) TABS tablet Take 1 tablet by mouth daily.   03/02/2019 at  am  . oseltamivir (TAMIFLU) 75 MG capsule Take 75 mg by mouth 2 (two) times daily. 5 day course started 03/01/19 pm   03/02/2019 at pm  . predniSONE (DELTASONE) 10 MG tablet Take 10 mg by mouth daily.   03/02/2019 at am  . tiotropium (SPIRIVA) 18 MCG inhalation capsule Place 1 capsule (18 mcg total) into inhaler and inhale daily. 30 capsule 12 03/02/2019 at am    Assessment: 53 YOM who presented with SOB found to have new AFib with CHADSVASc score 4. Pharmacy consulted to start rivaroxaban for stroke prevention. H/H on 3/21 was low stable. Plt wnl. CrCl ~ 34 mL/min.   Goal of Therapy:  Primary stroke prevention Monitor platelets by anticoagulation protocol: Yes   Plan:  -Start rivaroxaban 15 mg daily (dose adjusted for renal function)  -Monitor for s/s of bleeding  Thank you for allowing pharmacy to be a part of this patient's care.  Tamela Gammon, PharmD 03/10/2019 1:11 PM PGY-1 Pharmacy Resident Direct Phone: [phone] Please check [email or web address] for unit-specific pharmacist phone numbers

## 2019-03-11 DIAGNOSIS — I4891 Unspecified atrial fibrillation: Secondary | ICD-10-CM

## 2019-03-11 DIAGNOSIS — I480 Paroxysmal atrial fibrillation: Secondary | ICD-10-CM

## 2019-03-11 DIAGNOSIS — I4811 Longstanding persistent atrial fibrillation: Secondary | ICD-10-CM

## 2019-03-11 LAB — CBC
HCT: 36.9 % — ABNORMAL LOW (ref 39.0–52.0)
HEMOGLOBIN: 11.5 g/dL — AB (ref 13.0–17.0)
MCH: 32.4 pg (ref 26.0–34.0)
MCHC: 31.2 g/dL (ref 30.0–36.0)
MCV: 103.9 fL — ABNORMAL HIGH (ref 80.0–100.0)
PLATELETS: 302 10*3/uL (ref 150–400)
RBC: 3.55 MIL/uL — AB (ref 4.22–5.81)
RDW: 16 % — ABNORMAL HIGH (ref 11.5–15.5)
WBC: 8.5 10*3/uL (ref 4.0–10.5)
nRBC: 0 % (ref 0.0–0.2)

## 2019-03-11 LAB — BASIC METABOLIC PANEL
Anion gap: 9 (ref 5–15)
BUN: 59 mg/dL — ABNORMAL HIGH (ref 8–23)
CO2: 26 mmol/L (ref 22–32)
Calcium: 9 mg/dL (ref 8.9–10.3)
Chloride: 107 mmol/L (ref 98–111)
Creatinine, Ser: 2.29 mg/dL — ABNORMAL HIGH (ref 0.61–1.24)
GFR calc Af Amer: 29 mL/min — ABNORMAL LOW (ref >60)
GFR calc non Af Amer: 25 mL/min — ABNORMAL LOW (ref >60)
GLUCOSE: 174 mg/dL — AB (ref 70–99)
Potassium: 3.8 mmol/L (ref 3.5–5.1)
Sodium: 142 mmol/L (ref 135–145)

## 2019-03-11 LAB — MAGNESIUM: Magnesium: 1.7 mg/dL (ref 1.7–2.4)

## 2019-03-11 LAB — BRAIN NATRIURETIC PEPTIDE: B Natriuretic Peptide: 640.1 pg/mL — ABNORMAL HIGH (ref 0.0–100.0)

## 2019-03-11 MED ORDER — DILTIAZEM HCL ER COATED BEADS 240 MG PO CP24
240.0000 mg | ORAL_CAPSULE | Freq: Every day | ORAL | Status: DC
  Administered 2019-03-12 – 2019-03-14 (×3): 240 mg via ORAL
  Filled 2019-03-11 (×4): qty 1

## 2019-03-11 MED ORDER — DILTIAZEM HCL 60 MG PO TABS: 60.0000 mg | ORAL_TABLET | Freq: Four times a day (QID) | ORAL | Status: DC

## 2019-03-11 MED ORDER — FUROSEMIDE 40 MG PO TABS
40.0000 mg | ORAL_TABLET | Freq: Two times a day (BID) | ORAL | Status: DC
  Administered 2019-03-11 – 2019-03-15 (×8): 40 mg via ORAL
  Filled 2019-03-11 (×8): qty 1

## 2019-03-11 MED ORDER — DILTIAZEM HCL 60 MG PO TABS
30.0000 mg | ORAL_TABLET | Freq: Four times a day (QID) | ORAL | Status: AC
  Administered 2019-03-11 – 2019-03-12 (×4): 30 mg via ORAL
  Filled 2019-03-11 (×4): qty 1

## 2019-03-11 NOTE — Progress Notes (Signed)
PROGRESS NOTE    David Barton  ALP:[id number] DOB: 02/04/1933 DOA: 03/03/2019 PCP: Maury Dus, MD   Brief Narrative:  85 year old with history of COPD/emphysema, PSVT, essential hypertension, systolic CHF with ejection fraction 30-35%, alcohol abuse, colon cancer admitted from [facility] due to acute hypoxic respiratory failure and septic shock with acute kidney injury.  Initially requiring intubation and pressors.  He was started on broad-spectrum antibiotics.  Eventually extubated on 03/06/2019.  Influenza swab was negative.  But prior to that had been receiving Tamiflu.  There was a high suspicion for COVID19 which was ruled out.    Assessment & Plan:   Active Problems:   Sepsis ([place])  Acute hypoxic respiratory failure secondary to community-acquired pneumonia, streptococcal; still on 6HF [place] Abnormal breath sounds - Slow to improve. ProCal- 2.87. BNP slightly elevated.  Completed course of azithromycin, will continue IV Rocephin likely for another 24-48 hours.  Supplemental oxygen as needed, wean off oxygen. -Aggressive bronchodilator treatment, Pulmicort, IS/Flutter. - Continue IV Solu-Medrol.  Acute on chronic pressure congestive heart failure, ejection fraction 55%, class III Bilateral pleural effusion with infiltrate -Transitioning from IV Lasix to 40 mg oral Lasix twice daily.  Strictly monitor input and output.  Atrial fibrillation with mild RVR, persistent -Increase Cardizem to 240 mg orally daily starting tomorrow.  Today we will add Cardizem 60 mg every 6 hours and monitor.  on Xarelto.  Acute mild to moderate COPD exacerbation -Continue current bronchodilators with IV Rocephin.  Cont Solumedrol.  Acute kidney injury likely secondary to ATN -Renal function continues to improve.  Currently stable around 2.2.  Advised nursing staff to remove Foley catheter in place condom catheter.  Bilateral upper extremity swelling -Suspect dependent edema.  Advised to raise his  arm and put pillows underneath it.  Generalized weakness -Physical therapy recommends CIR, their team has been consulted.  DVT prophylaxis: Xarelto Code Status: DNR Family Communication: Wife at bedside Disposition Plan: Maintain hospital stay for aggressive breathing treatments.  In the meantime CIR evaluation.  Antimicrobials:   Rocephin   Subjective: Feels a little better. Still gets very short of breath and tachycardic with minimal movement.   Review of Systems Otherwise negative except as per HPI, including: General = no fevers, chills, dizziness, malaise, fatigue HEENT/EYES = negative for pain, redness, loss of vision, double vision, blurred vision, loss of hearing, sore throat, hoarseness, dysphagia Cardiovascular= negative for chest pain, palpitation, murmurs, lower extremity swelling Respiratory/lungs= negative for  hemoptysis, wheezing, mucus production Gastrointestinal= negative for nausea, vomiting,, abdominal pain, melena, hematemesis Genitourinary= negative for Dysuria, Hematuria, Change in Urinary Frequency MSK = Negative for arthralgia, myalgias, Back Pain, Joint swelling  Neurology= Negative for headache, seizures, numbness, tingling  Psychiatry= Negative for anxiety, depression, suicidal and homocidal ideation Allergy/Immunology= Medication/Food allergy as listed  Skin= Negative for Rash, lesions, ulcers, itching    Objective: Vitals:   03/11/19 0500 03/11/19 0734 03/11/19 0905 03/11/19 0936  BP:  133/76 135/84   Pulse:  99 98 100  Resp:  (!) 24 (!) 24 (!) 21  Temp:   (!) 97.5 F (36.4 C)   TempSrc:   Oral   SpO2:  (!) 80% 92% 96%  Weight: 111.6 kg     Height:        Intake/Output Summary (Last 24 hours) at 03/11/2019 1023 Last data filed at 03/11/2019 0509 Gross per 24 hour  Intake -  Output 1700 ml  Net -1700 ml   Filed Weights   03/10/19 0500 03/11/19 0028 03/11/19  0500  Weight: 113.6 kg 113.4 kg 111.6 kg    Examination: Constitutional:  NAD, calm, comfortable; on 10LNC Eyes: PERRL, lids and conjunctivae normal ENMT: Mucous membranes are moist. Posterior pharynx clear of any exudate or lesions.Normal dentition.  Neck: normal, supple, no masses, no thyromegaly Respiratory: Diffuse coarse breath sounds with mild expiratory wheezing Cardiovascular: Regular rate and rhythm, no murmurs / rubs / gallops. No extremity edema. 2+ pedal pulses. No carotid bruits.  Abdomen: no tenderness, no masses palpated. No hepatosplenomegaly. Bowel sounds positive.  Musculoskeletal: Mild bilateral upper extremity swelling which is improved. Skin: no rashes, lesions, ulcers. No induration Neurologic: CN 2-12 grossly intact. Sensation intact, DTR normal. Strength 4/5 in all 4.  Psychiatric: Normal judgment and insight. Alert and oriented x 3. Normal mood.    Data Reviewed:   CBC: Recent Labs  Lab 03/05/19 0317 03/05/19 0539 03/09/19 0359 03/10/19 1130 03/11/19 0438  WBC  --  13.1* 7.6 4.9 8.5  HGB 11.9* 11.6* 11.6* 11.6* 11.5*  HCT 35.0* 38.0* 37.7* 38.8* 36.9*  MCV  --  107.0* 106.2* 106.0* 103.9*  PLT  --  186 261 259 654   Basic Metabolic Panel: Recent Labs  Lab 03/05/19 0539 03/07/19 0929 03/09/19 0359 03/10/19 1130 03/10/19 1821 03/11/19 0438  NA 143 144 145 143 144 142  K 5.3* 4.3 4.2 4.6 4.2 3.8  CL 111 113* 114* 112* 108 107  CO2 23 26 21* 25 26 26   GLUCOSE 130* 134* 83 233* 226* 174*  BUN 82* 62* 48* 51* 55* 59*  CREATININE 3.62* 2.30* 1.96* 2.10* 2.42* 2.29*  CALCIUM 7.5* 8.5* 8.8* 8.9 9.4 9.0  MG 2.0  --   --  1.8 1.6* 1.7  PHOS 5.4*  --   --   --   --   --    GFR: Estimated Creatinine Clearance: 31 mL/min (A) (by C-G formula based on SCr of 2.29 mg/dL (H)). Liver Function Tests: No results for input(s): AST, ALT, ALKPHOS, BILITOT, PROT, ALBUMIN in the last 168 hours. No results for input(s): LIPASE, AMYLASE in the last 168 hours. No results for input(s): AMMONIA in the last 168 hours. Coagulation Profile:  No results for input(s): INR, PROTIME in the last 168 hours. Cardiac Enzymes: No results for input(s): CKTOTAL, CKMB, CKMBINDEX, TROPONINI in the last 168 hours. BNP (last 3 results) No results for input(s): PROBNP in the last 8760 hours. HbA1C: No results for input(s): HGBA1C in the last 72 hours. CBG: Recent Labs  Lab 03/07/19 0003 03/07/19 0408 03/07/19 0806 03/07/19 1128 03/07/19 1550  GLUCAP 107* 96 89 112* 96   Lipid Profile: No results for input(s): CHOL, HDL, LDLCALC, TRIG, CHOLHDL, LDLDIRECT in the last 72 hours. Thyroid Function Tests: No results for input(s): TSH, T4TOTAL, FREET4, T3FREE, THYROIDAB in the last 72 hours. Anemia Panel: Recent Labs    03/10/19 1821  VITAMINB12 929*   Sepsis Labs: Recent Labs  Lab 03/09/19 0359  PROCALCITON 2.87    Recent Results (from the past 240 hour(s))  Respiratory Panel by PCR     Status: None   Collection Time: 03/03/19  9:23 AM  Result Value Ref Range Status   Adenovirus NOT DETECTED NOT DETECTED Final   Coronavirus 229E NOT DETECTED NOT DETECTED Final    Comment: (NOTE) The Coronavirus on the Respiratory Panel, DOES NOT test for the novel  Coronavirus (2019 nCoV)    Coronavirus HKU1 NOT DETECTED NOT DETECTED Final   Coronavirus NL63 NOT DETECTED NOT DETECTED Final  Coronavirus OC43 NOT DETECTED NOT DETECTED Final   Metapneumovirus NOT DETECTED NOT DETECTED Final   Rhinovirus / Enterovirus NOT DETECTED NOT DETECTED Final   Influenza A NOT DETECTED NOT DETECTED Final   Influenza B NOT DETECTED NOT DETECTED Final   Parainfluenza Virus 1 NOT DETECTED NOT DETECTED Final   Parainfluenza Virus 2 NOT DETECTED NOT DETECTED Final   Parainfluenza Virus 3 NOT DETECTED NOT DETECTED Final   Parainfluenza Virus 4 NOT DETECTED NOT DETECTED Final   Respiratory Syncytial Virus NOT DETECTED NOT DETECTED Final   Bordetella pertussis NOT DETECTED NOT DETECTED Final   Chlamydophila pneumoniae NOT DETECTED NOT DETECTED Final    Mycoplasma pneumoniae NOT DETECTED NOT DETECTED Final    Comment: Performed at [facility], [place] [address] [place], [place] [address]  Novel Coronavirus, NAA (hospital order; send-out to ref lab)     Status: None   Collection Time: 03/03/19  9:24 AM  Result Value Ref Range Status   SARS-CoV-2, NAA Not Detected Not Detected Final    Comment: (NOTE) Testing was performed using the cobas(R) SARS-CoV-2 test. This test was developed and its performance characteristics determined by [employer]. This test has not been FDA cleared or approved. This test has been authorized by FDA under an Emergency Use Authorization (EUA). This test is only authorized for the duration of time the declaration that circumstances exist justifying the authorization of the emergency use of in vitro diagnostic tests for detection of SARS-CoV-2 virus and/or diagnosis of COVID-19 infection under section 564(b)(1) of the Act, 21 U.S.C. 568LEX-5(T)(7), unless the authorization is terminated or revoked sooner. Performed At: [facility] [address] [place], [place] [id number] Rush Farmer MD QP:[id number]    Coronavirus Source NASAL SWAB  Final    Comment: Performed at [facility], [place] [address], [place], [place] [address]  Blood Culture (routine x 2)     Status: None   Collection Time: 03/03/19  9:47 AM  Result Value Ref Range Status   Specimen Description BLOOD RIGHT HAND  Final   Special Requests   Final    BOTTLES DRAWN AEROBIC AND ANAEROBIC Blood Culture adequate volume   Culture   Final    NO GROWTH 5 DAYS Performed at [facility], [place] [address] [place], [place] [address]    Report Status 03/08/2019 FINAL  Final  Blood Culture (routine x 2)     Status: None   Collection Time: 03/03/19 10:38 AM  Result Value Ref Range Status   Specimen Description BLOOD LEFT HAND  Final   Special Requests AEROBIC BOTTLE ONLY Blood Culture adequate volume  Final   Culture    Final    NO GROWTH 5 DAYS Performed at [facility], [place] [address] [place], [place] [address]    Report Status 03/08/2019 FINAL  Final  MRSA PCR Screening     Status: None   Collection Time: 03/03/19  3:59 PM  Result Value Ref Range Status   MRSA by PCR NEGATIVE NEGATIVE Final    Comment:        The GeneXpert MRSA Assay (FDA approved for NASAL specimens only), is one component of a comprehensive MRSA colonization surveillance program. It is not intended to diagnose MRSA infection nor to guide or monitor treatment for MRSA infections. Performed at [facility], [place] [address], [place],  [address]   Culture, respiratory (non-expectorated)     Status: None   Collection Time: 03/03/19  6:38 PM  Result Value Ref Range Status   Specimen Description TRACHEAL ASPIRATE  Final   Special Requests NONE  Final   Gram Stain   Final    RARE WBC PRESENT, PREDOMINANTLY PMN FEW GRAM POSITIVE COCCI IN PAIRS Performed at [facility], [place] [address] [place], [place] [address]    Culture FEW STREPTOCOCCUS PNEUMONIAE  Final   Report Status 03/06/2019 FINAL  Final   Organism ID, Bacteria STREPTOCOCCUS PNEUMONIAE  Final      Susceptibility   Streptococcus pneumoniae - MIC*    ERYTHROMYCIN >=8 RESISTANT Resistant     LEVOFLOXACIN 1 SENSITIVE Sensitive     VANCOMYCIN 0.5 SENSITIVE Sensitive     PENICILLIN (non-meningitis) 1 SENSITIVE Sensitive     CEFTRIAXONE (non-meningitis) 1 SENSITIVE Sensitive     * FEW STREPTOCOCCUS PNEUMONIAE         Radiology Studies: Dg Chest Port 1 View  Result Date: 03/09/2019 CLINICAL DATA:  Shortness of breath. EXAM: PORTABLE CHEST 1 VIEW COMPARISON:  03/05/2019. FINDINGS: Cardiomegaly with diffuse bilateral pulmonary infiltrates/edema and bilateral pleural effusions. No pneumothorax. IMPRESSION: Cardiomegaly with diffuse bilateral pulmonary infiltrates/edema and bilateral pleural effusions. Similar findings noted on prior exam.  Findings consistent with CHF. Electronically Signed   By: Marcello Moores  Register   On: 03/09/2019 12:03        Scheduled Meds: . arformoterol  15 mcg Nebulization BID  . budesonide (PULMICORT) nebulizer solution  0.5 mg Nebulization BID  . diltiazem  180 mg Oral Daily  . docusate sodium  100 mg Oral BID  . feeding supplement (ENSURE ENLIVE)  237 mL Oral BID BM  . ipratropium  0.5 mg Nebulization BID  . levalbuterol  0.63 mg Nebulization TID  . mouth rinse  15 mL Mouth Rinse BID  . methylPREDNISolone (SOLU-MEDROL) injection  40 mg Intravenous Q8H  . rivaroxaban  15 mg Oral Q supper   Continuous Infusions:   LOS: 8 days   Time spent= 35 mins     Arsenio Loader, MD [facility]  If 7PM-7AM, please contact night-coverage [email or web address] 03/11/2019, 10:23 AM

## 2019-03-12 DIAGNOSIS — J811 Chronic pulmonary edema: Secondary | ICD-10-CM

## 2019-03-12 DIAGNOSIS — I10 Essential (primary) hypertension: Secondary | ICD-10-CM

## 2019-03-12 LAB — FOLATE RBC
Folate, Hemolysate: >620 ng/mL
Folate, RBC: >1694 ng/mL (ref >498)
Hematocrit: 36.6 % — ABNORMAL LOW (ref 37.5–51.0)

## 2019-03-12 LAB — BASIC METABOLIC PANEL
Anion gap: 11 (ref 5–15)
BUN: 68 mg/dL — AB (ref 8–23)
CO2: 24 mmol/L (ref 22–32)
Calcium: 8.6 mg/dL — ABNORMAL LOW (ref 8.9–10.3)
Chloride: 105 mmol/L (ref 98–111)
Creatinine, Ser: 2.47 mg/dL — ABNORMAL HIGH (ref 0.61–1.24)
GFR calc Af Amer: 27 mL/min — ABNORMAL LOW (ref >60)
GFR calc non Af Amer: 23 mL/min — ABNORMAL LOW (ref >60)
Glucose, Bld: 176 mg/dL — ABNORMAL HIGH (ref 70–99)
POTASSIUM: 3.5 mmol/L (ref 3.5–5.1)
SODIUM: 140 mmol/L (ref 135–145)

## 2019-03-12 LAB — MAGNESIUM: Magnesium: 1.5 mg/dL — ABNORMAL LOW (ref 1.7–2.4)

## 2019-03-12 LAB — CBC
HCT: 36 % — ABNORMAL LOW (ref 39.0–52.0)
Hemoglobin: 11.5 g/dL — ABNORMAL LOW (ref 13.0–17.0)
MCH: 32.4 pg (ref 26.0–34.0)
MCHC: 31.9 g/dL (ref 30.0–36.0)
MCV: 101.4 fL — ABNORMAL HIGH (ref 80.0–100.0)
Platelets: 276 10*3/uL (ref 150–400)
RBC: 3.55 MIL/uL — ABNORMAL LOW (ref 4.22–5.81)
RDW: 16 % — ABNORMAL HIGH (ref 11.5–15.5)
WBC: 7.3 10*3/uL (ref 4.0–10.5)
nRBC: 0 % (ref 0.0–0.2)

## 2019-03-12 MED ORDER — MAGNESIUM OXIDE 400 (241.3 MG) MG PO TABS
800.0000 mg | ORAL_TABLET | Freq: Four times a day (QID) | ORAL | Status: AC
  Administered 2019-03-12 (×3): 800 mg via ORAL
  Filled 2019-03-12 (×3): qty 2

## 2019-03-12 MED ORDER — POTASSIUM CHLORIDE CRYS ER 20 MEQ PO TBCR
40.0000 meq | EXTENDED_RELEASE_TABLET | Freq: Once | ORAL | Status: AC
  Administered 2019-03-12: 40 meq via ORAL
  Filled 2019-03-12: qty 2

## 2019-03-12 NOTE — Plan of Care (Signed)
  Problem: Activity: Goal: Ability to tolerate increased activity will improve Outcome: Progressing   Problem: Respiratory: Goal: Ability to maintain a clear airway and adequate ventilation will improve Outcome: Progressing

## 2019-03-12 NOTE — Progress Notes (Signed)
Occupational Therapy Treatment Patient Details Name: David Barton MRN: [id number] DOB: [birth year] Today's Date: 03/12/2019    History of present illness 85 year old man prior tobacco, psvt, systolic hf ef 63-01% with normalization to 50's osa not on therapy, with a 4-day history of increasing shortness of breath with productive cough.  Flu swab negative at his physician's office on 3/12.  On Tamiflu empirically.  Developed fever and rigors 3/13 and was increasingly short of breath and altered this morning prompting his wife to call EMS. Prior history of pneumonia on background of COPD from remote smoking. No recent travel, no sick contacts.   OT comments  Pt is making steady progress. Pt mobilizing with occasional min guard A. Desat to 85 on 6L; DOE 2/4 during functional ADL task @ RW level. Educated pt on pursed lip breathing. Encouraged mobility with staff and use of incentive spirometer. Will continue to follow for discharge planning and to maximize functional independence with ADL and mobility.   Follow Up Recommendations  CIR;Supervision - Intermittent(pending progress)    Equipment Recommendations  None recommended by OT    Recommendations for Other Services      Precautions / Restrictions Precautions Precautions: Fall Precaution Comments: watch O2 Sats Restrictions Weight Bearing Restrictions: No       Mobility Bed Mobility Overal bed mobility: Modified Independent Bed Mobility: Supine to Sit;Sit to Supine     Supine to sit: Modified independent (Device/Increase time) Sit to supine: Modified independent (Device/Increase time)     Transfers Overall transfer level: Needs assistance Equipment used: Rolling walker (2 wheeled) Transfers: Sit to/from Stand Sit to Stand: Supervision         General transfer comment: LOB posteriorly x 1 after sit - stand 6 times    Balance Overall balance assessment: Needs assistance Sitting-balance support: Feet  supported Sitting balance-Leahy Scale: Good       Standing balance-Leahy Scale: Fair  able to release RW to complete LB dressing task                           ADL either performed or assessed with clinical judgement   ADL Overall ADL's : Needs assistance/impaired     Grooming: Set up;Standing   Upper Body Bathing: Set up;Sitting   Lower Body Bathing: Minimal assistance;Sit to/from stand   Upper Body Dressing : Minimal assistance   Lower Body Dressing: Minimal assistance;Sit to/from stand   Toilet Transfer: Supervision/safety;Ambulation;RW   Toileting- Clothing Manipulation and Hygiene: Supervision/safety;Sit to/from stand       Functional mobility during ADLs: Supervision/safety;Rolling walker;Cueing for safety General ADL Comments: 2/4 DOE. Desat to 11 6L     Vision       Perception     Praxis      Cognition Arousal/Alertness: Awake/alert Behavior During Therapy: WFL for tasks assessed/performed Overall Cognitive Status: Within Functional Limits for tasks assessed                                          Exercises Exercises: Other exercises Other Exercises Other Exercises: pursed lip breathing Other Exercises: encouraged use of incentive spirometer   Shoulder Instructions       General Comments Educated on pursed lip breathing    Pertinent Vitals/ Pain       Pain Assessment: No/denies pain  Home Living  Prior Functioning/Environment              Frequency  Min 3X/week        Progress Toward Goals  OT Goals(current goals can now be found in the care plan section)  Progress towards OT goals: Progressing toward goals  Acute Rehab OT Goals Patient Stated Goal: get stronger OT Goal Formulation: With patient Time For Goal Achievement: 03/17/19 Potential to Achieve Goals: Good ADL Goals Pt Will Perform Lower Body Bathing: with modified  independence;sit to/from stand Pt Will Transfer to Toilet: with modified independence;ambulating Pt Will Perform Tub/Shower Transfer: with modified independence;ambulating Additional ADL Goal #1: Pt will independently verbalize 3 energy conservation strategies Additional ADL Goal #2: Pt will independently verbalize 3 strategies to prevent falls  Plan Discharge plan remains appropriate;Frequency needs to be updated    Co-evaluation                 AM-PAC OT "6 Clicks" Daily Activity     Outcome Measure   Help from another person eating meals?: None Help from another person taking care of personal grooming?: None Help from another person toileting, which includes using toliet, bedpan, or urinal?: A Little Help from another person bathing (including washing, rinsing, drying)?: A Little Help from another person to put on and taking off regular upper body clothing?: A Little Help from another person to put on and taking off regular lower body clothing?: A Little 6 Click Score: 20    End of Session Equipment Utilized During Treatment: Rolling walker;Oxygen(6L)  OT Visit Diagnosis: Unsteadiness on feet (R26.81);Repeated falls (R29.6);Pain   Activity Tolerance Patient tolerated treatment well   Patient Left in bed;with call bell/phone within reach;with bed alarm set;with nursing/sitter in room   Nurse Communication Mobility status        Time: 1645-1710 OT Time Calculation (min): 25 min  Charges: OT General Charges $OT Visit: 1 Visit OT Treatments $Self Care/Home Management : 23-37 mins  Maurie Boettcher, OT/L   Acute OT Clinical Specialist [place] Pager [phone] Office [phone]    [facility] 03/12/2019, 5:46 PM

## 2019-03-12 NOTE — Progress Notes (Signed)
Physical Therapy Treatment Patient Details Name: David Barton MRN: [id number] DOB: [birth year] Today's Date: 03/12/2019    History of Present Illness 85 year old man prior tobacco, psvt, systolic hf ef 00-93% with normalization to 50's osa not on therapy, with a 4-day history of increasing shortness of breath with productive cough.  Flu swab negative at his physician's office on 3/12.  On Tamiflu empirically.  Developed fever and rigors 3/13 and was increasingly short of breath and altered this morning prompting his wife to call EMS. Prior history of pneumonia on background of COPD from remote smoking. No recent travel, no sick contacts.    PT Comments    Pt performed gait training and remains limited due to SPO2. Plan for aggressive CIR therapies remain  Appropriate to improve patient back to baseline.  He was not on O2 at baseline.  He continues to require assistance for safety.     Follow Up Recommendations  CIR     Equipment Recommendations  (TBD)    Recommendations for Other Services       Precautions / Restrictions Precautions Precautions: Fall Restrictions Weight Bearing Restrictions: No    Mobility  Bed Mobility               General bed mobility comments: Pt seated in chair on arrival.    Transfers Overall transfer level: Needs assistance Equipment used: Rolling walker (2 wheeled) Transfers: Sit to/from Stand Sit to Stand: Min guard         General transfer comment: min guard for safety; cues for safe hand placement  Ambulation/Gait Ambulation/Gait assistance: Min guard Gait Distance (Feet): 40 Feet(+ 30 ft trial.  seated rest break between trials.  ) Assistive device: Rolling walker (2 wheeled) Gait Pattern/deviations: Step-through pattern;Decreased step length - right;Decreased step length - left;Trunk flexed Gait velocity: decreased   General Gait Details: cues for pursed lip breathing, posture, and safe use of AD.  Decreased SPO2 noted on 6L  but recovers quickly with seated rest breaks.  SPO2 droped to 86% and required rest break to recover to 92%.     Stairs             Wheelchair Mobility    Modified Rankin (Stroke Patients Only)       Balance Overall balance assessment: Needs assistance Sitting-balance support: Feet supported Sitting balance-Leahy Scale: Fair       Standing balance-Leahy Scale: Poor                              Cognition Arousal/Alertness: Awake/alert Behavior During Therapy: WFL for tasks assessed/performed Overall Cognitive Status: Within Functional Limits for tasks assessed                                        Exercises      General Comments        Pertinent Vitals/Pain Pain Assessment: No/denies pain    Home Living                      Prior Function            PT Goals (current goals can now be found in the care plan section) Acute Rehab PT Goals Patient Stated Goal: get stronger Potential to Achieve Goals: Good Progress towards PT goals: Progressing toward goals    Frequency  Min 3X/week      PT Plan Current plan remains appropriate    Co-evaluation              AM-PAC PT "6 Clicks" Mobility   Outcome Measure  Help needed turning from your back to your side while in a flat bed without using bedrails?: A Little Help needed moving from lying on your back to sitting on the side of a flat bed without using bedrails?: A Lot Help needed moving to and from a bed to a chair (including a wheelchair)?: A Little Help needed standing up from a chair using your arms (e.g., wheelchair or bedside chair)?: A Little Help needed to walk in hospital room?: A Little Help needed climbing 3-5 steps with a railing? : A Lot 6 Click Score: 16    End of Session Equipment Utilized During Treatment: Gait belt Activity Tolerance: Patient tolerated treatment well Patient left: with call bell/phone within reach;in chair Nurse  Communication: Mobility status PT Visit Diagnosis: Unsteadiness on feet (R26.81)     Time: 4373-5789 PT Time Calculation (min) (ACUTE ONLY): 17 min  Charges:  $Gait Training: 8-22 mins                     Governor Rooks, PTA Acute Rehabilitation Services Pager [phone] Office [phone]     Cy Bresee Eli Hose 03/12/2019, 2:34 PM

## 2019-03-12 NOTE — Progress Notes (Signed)
Inpatient Rehabilitation-Admissions Coordinator   [facility] received notice that the pt's initial request for CIR was denied. They did offer a peer to peer to attempt to overturn the denial. Dr. Reesa Chew has agreed to complete a peer to peer conference. Will update once final determination has been made.   Please call if questions.   Jhonnie Garner, OTR/L  Rehab Admissions Coordinator  [phone] 03/12/2019 1:48 PM

## 2019-03-12 NOTE — Progress Notes (Signed)
PROGRESS NOTE    David Barton  OEU:[id number] DOB: 02-13-1933 DOA: 03/03/2019 PCP: Maury Dus, MD   Brief Narrative:  85 year old with history of COPD/emphysema, PSVT, essential hypertension, systolic CHF with ejection fraction 30-35%, alcohol abuse, colon cancer admitted from [place] due to acute hypoxic respiratory failure and septic shock with acute kidney injury.  Initially requiring intubation and pressors.  He was started on broad-spectrum antibiotics.  Eventually extubated on 03/06/2019.  Influenza swab was negative.  But prior to that had been receiving Tamiflu.  There was a high suspicion for COVID19 which was ruled out.    Assessment & Plan:   Active Problems:   Acute respiratory failure ([name])   COPD with acute exacerbation ([name])   Essential hypertension   Community acquired pneumonia of right lung   Acute systolic CHF (congestive heart failure) ([name])   Pulmonary edema   Edema of both legs   Atrial fibrillation with RVR ([name])  Acute hypoxic respiratory failure secondary to community-acquired pneumonia, streptococcal; still on 6HF [place] Abnormal breath sounds - Slow to improve. ProCal- 2.87. BNP slightly elevated.  Completed course of azithromycin, Cont IV Rocephin today.  Supplemental oxygen as needed, wean off oxygen. -Aggressive bronchodilator treatment, Pulmicort, IS/Flutter. - Continue IV Solu-Medrol.  Acute on chronic pressure congestive heart failure, ejection fraction 55%, class III Bilateral pleural effusion with infiltrate -Now on Lasix 40mg  PO BID.  Strictly monitor input and output.  Atrial fibrillation with mild RVR, persistent - Cardizem increased to 240 mg daily.  Continue Xarelto.  Acute mild to moderate COPD exacerbation -Continue current bronchodilators with IV Rocephin.  Cont Solumedrol.  Acute kidney injury likely secondary to ATN -Cr slightly increased to 2.4. Advised nursing staff to remove Foley catheter in place condom catheter.   Bilateral upper extremity swelling -Suspect dependent edema.  Advised to raise his arm and put pillows underneath it.  Generalized weakness -Physical therapy recommends CIR, their team has been consulted.  DVT prophylaxis: Xarelto Code Status: DNR Family Communication: Wife at bedside Disposition Plan: Maintain hospital stay, patient still requires quite a bit of oxygen.  We will try and wean him down over the next couple of days and then transition him to CIR.  Antimicrobials:   Rocephin   Subjective: Feels better but still on 10L HF [place]. Able to bring up some cough.   Review of Systems Otherwise negative except as per HPI, including: General = no fevers, chills, dizziness, malaise, fatigue HEENT/EYES = negative for pain, redness, loss of vision, double vision, blurred vision, loss of hearing, sore throat, hoarseness, dysphagia Cardiovascular= negative for chest pain, palpitation, murmurs, lower extremity swelling Respiratory/lungs= negative for  hemoptysis, wheezing, mucus production Gastrointestinal= negative for nausea, vomiting,, abdominal pain, melena, hematemesis Genitourinary= negative for Dysuria, Hematuria, Change in Urinary Frequency MSK = Negative for arthralgia, myalgias, Back Pain, Joint swelling  Neurology= Negative for headache, seizures, numbness, tingling  Psychiatry= Negative for anxiety, depression, suicidal and homocidal ideation Allergy/Immunology= Medication/Food allergy as listed  Skin= Negative for Rash, lesions, ulcers, itching    Objective: Vitals:   03/12/19 0349 03/12/19 0415 03/12/19 0500 03/12/19 0927  BP: 117/65     Pulse: 99 92    Resp: 16 19    Temp:  (!) 97.5 F (36.4 C)    TempSrc:  Axillary    SpO2:  92%  96%  Weight:   110.9 kg   Height:        Intake/Output Summary (Last 24 hours) at 03/12/2019 1046 Last data filed  at 03/12/2019 0509 Gross per 24 hour  Intake -  Output 2400 ml  Net -2400 ml   Filed Weights   03/11/19 0028  03/11/19 0500 03/12/19 0500  Weight: 113.4 kg 111.6 kg 110.9 kg    Examination: Constitutional: NAD, calm, comfortable; 10L [place] HF Eyes: PERRL, lids and conjunctivae normal ENMT: Mucous membranes are moist. Posterior pharynx clear of any exudate or lesions.Normal dentition.  Neck: normal, supple, no masses, no thyromegaly Respiratory: Diffuse Coarse BS Cardiovascular: Regular rate and rhythm, no murmurs / rubs / gallops. No extremity edema. 2+ pedal pulses. No carotid bruits.  Abdomen: no tenderness, no masses palpated. No hepatosplenomegaly. Bowel sounds positive.  Musculoskeletal: no clubbing / cyanosis. No joint deformity upper and lower extremities. Good ROM, no contractures. Normal muscle tone.  Skin: no rashes, lesions, ulcers. No induration Neurologic: CN 2-12 grossly intact. Sensation intact, DTR normal. Strength 5/5 in all 4.  Psychiatric: Normal judgment and insight. Alert and oriented x 3. Normal mood.   Data Reviewed:   CBC: Recent Labs  Lab 03/09/19 0359 03/10/19 1130 03/11/19 0438 03/12/19 0547  WBC 7.6 4.9 8.5 7.3  HGB 11.6* 11.6* 11.5* 11.5*  HCT 37.7* 38.8* 36.9* 36.0*  MCV 106.2* 106.0* 103.9* 101.4*  PLT 261 259 302 211   Basic Metabolic Panel: Recent Labs  Lab 03/09/19 0359 03/10/19 1130 03/10/19 1821 03/11/19 0438 03/12/19 0547  NA 145 143 144 142 140  K 4.2 4.6 4.2 3.8 3.5  CL 114* 112* 108 107 105  CO2 21* 25 26 26 24   GLUCOSE 83 233* 226* 174* 176*  BUN 48* 51* 55* 59* 68*  CREATININE 1.96* 2.10* 2.42* 2.29* 2.47*  CALCIUM 8.8* 8.9 9.4 9.0 8.6*  MG  --  1.8 1.6* 1.7 1.5*   GFR: Estimated Creatinine Clearance: 28.7 mL/min (A) (by C-G formula based on SCr of 2.47 mg/dL (H)). Liver Function Tests: No results for input(s): AST, ALT, ALKPHOS, BILITOT, PROT, ALBUMIN in the last 168 hours. No results for input(s): LIPASE, AMYLASE in the last 168 hours. No results for input(s): AMMONIA in the last 168 hours. Coagulation Profile: No results for  input(s): INR, PROTIME in the last 168 hours. Cardiac Enzymes: No results for input(s): CKTOTAL, CKMB, CKMBINDEX, TROPONINI in the last 168 hours. BNP (last 3 results) No results for input(s): PROBNP in the last 8760 hours. HbA1C: No results for input(s): HGBA1C in the last 72 hours. CBG: Recent Labs  Lab 03/07/19 0003 03/07/19 0408 03/07/19 0806 03/07/19 1128 03/07/19 1550  GLUCAP 107* 96 89 112* 96   Lipid Profile: No results for input(s): CHOL, HDL, LDLCALC, TRIG, CHOLHDL, LDLDIRECT in the last 72 hours. Thyroid Function Tests: No results for input(s): TSH, T4TOTAL, FREET4, T3FREE, THYROIDAB in the last 72 hours. Anemia Panel: Recent Labs    03/10/19 1821  VITAMINB12 929*   Sepsis Labs: Recent Labs  Lab 03/09/19 0359  PROCALCITON 2.87    Recent Results (from the past 240 hour(s))  Respiratory Panel by PCR     Status: None   Collection Time: 03/03/19  9:23 AM  Result Value Ref Range Status   Adenovirus NOT DETECTED NOT DETECTED Final   Coronavirus 229E NOT DETECTED NOT DETECTED Final    Comment: (NOTE) The Coronavirus on the Respiratory Panel, DOES NOT test for the novel  Coronavirus (2019 nCoV)    Coronavirus HKU1 NOT DETECTED NOT DETECTED Final   Coronavirus NL63 NOT DETECTED NOT DETECTED Final   Coronavirus OC43 NOT DETECTED NOT DETECTED Final  Metapneumovirus NOT DETECTED NOT DETECTED Final   Rhinovirus / Enterovirus NOT DETECTED NOT DETECTED Final   Influenza A NOT DETECTED NOT DETECTED Final   Influenza B NOT DETECTED NOT DETECTED Final   Parainfluenza Virus 1 NOT DETECTED NOT DETECTED Final   Parainfluenza Virus 2 NOT DETECTED NOT DETECTED Final   Parainfluenza Virus 3 NOT DETECTED NOT DETECTED Final   Parainfluenza Virus 4 NOT DETECTED NOT DETECTED Final   Respiratory Syncytial Virus NOT DETECTED NOT DETECTED Final   Bordetella pertussis NOT DETECTED NOT DETECTED Final   Chlamydophila pneumoniae NOT DETECTED NOT DETECTED Final   Mycoplasma  pneumoniae NOT DETECTED NOT DETECTED Final    Comment: Performed at [facility], [place] [address] [place], [place] [address]  Novel Coronavirus, NAA (hospital order; send-out to ref lab)     Status: None   Collection Time: 03/03/19  9:24 AM  Result Value Ref Range Status   SARS-CoV-2, NAA Not Detected Not Detected Final    Comment: (NOTE) Testing was performed using the cobas(R) SARS-CoV-2 test. This test was developed and its performance characteristics determined by [employer]. This test has not been FDA cleared or approved. This test has been authorized by FDA under an Emergency Use Authorization (EUA). This test is only authorized for the duration of time the declaration that circumstances exist justifying the authorization of the emergency use of in vitro diagnostic tests for detection of SARS-CoV-2 virus and/or diagnosis of COVID-19 infection under section 564(b)(1) of the Act, 21 U.S.C. 371IRC-7(E)(9), unless the authorization is terminated or revoked sooner. Performed At: [facility] [address] [place], [place] [id number] Rush Farmer MD CH:[id number]    Coronavirus Source NASAL SWAB  Final    Comment: Performed at [facility], [place] [address], [place], [place] [address]  Blood Culture (routine x 2)     Status: None   Collection Time: 03/03/19  9:47 AM  Result Value Ref Range Status   Specimen Description BLOOD RIGHT HAND  Final   Special Requests   Final    BOTTLES DRAWN AEROBIC AND ANAEROBIC Blood Culture adequate volume   Culture   Final    NO GROWTH 5 DAYS Performed at [facility], [place] [address], [place], [place] [address]    Report Status 03/08/2019 FINAL  Final  Blood Culture (routine x 2)     Status: None   Collection Time: 03/03/19 10:38 AM  Result Value Ref Range Status   Specimen Description BLOOD LEFT HAND  Final   Special Requests AEROBIC BOTTLE ONLY Blood Culture adequate volume  Final   Culture   Final    NO  GROWTH 5 DAYS Performed at [facility], [place] [address], [place], [place] [address]    Report Status 03/08/2019 FINAL  Final  MRSA PCR Screening     Status: None   Collection Time: 03/03/19  3:59 PM  Result Value Ref Range Status   MRSA by PCR NEGATIVE NEGATIVE Final    Comment:        The GeneXpert MRSA Assay (FDA approved for NASAL specimens only), is one component of a comprehensive MRSA colonization surveillance program. It is not intended to diagnose MRSA infection nor to guide or monitor treatment for MRSA infections. Performed at [facility], [place] [address] [place], [place] [address]   Culture, respiratory (non-expectorated)     Status: None   Collection Time: 03/03/19  6:38 PM  Result Value Ref Range Status  Specimen Description TRACHEAL ASPIRATE  Final   Special Requests NONE  Final   Gram Stain   Final    RARE WBC PRESENT, PREDOMINANTLY PMN FEW GRAM POSITIVE COCCI IN PAIRS Performed at [facility], [place] [address] [place], [place] [address]    Culture FEW STREPTOCOCCUS PNEUMONIAE  Final   Report Status 03/06/2019 FINAL  Final   Organism ID, Bacteria STREPTOCOCCUS PNEUMONIAE  Final      Susceptibility   Streptococcus pneumoniae - MIC*    ERYTHROMYCIN >=8 RESISTANT Resistant     LEVOFLOXACIN 1 SENSITIVE Sensitive     VANCOMYCIN 0.5 SENSITIVE Sensitive     PENICILLIN (non-meningitis) 1 SENSITIVE Sensitive     CEFTRIAXONE (non-meningitis) 1 SENSITIVE Sensitive     * FEW STREPTOCOCCUS PNEUMONIAE         Radiology Studies: No results found.      Scheduled Meds: . arformoterol  15 mcg Nebulization BID  . budesonide (PULMICORT) nebulizer solution  0.5 mg Nebulization BID  . diltiazem  240 mg Oral Daily  . docusate sodium  100 mg Oral BID  . feeding supplement (ENSURE ENLIVE)  237 mL Oral BID BM  . furosemide  40 mg Oral BID  . ipratropium  0.5 mg Nebulization BID  . levalbuterol  0.63 mg Nebulization TID  . magnesium oxide  800 mg  Oral Q6H  . mouth rinse  15 mL Mouth Rinse BID  . methylPREDNISolone (SOLU-MEDROL) injection  40 mg Intravenous Q8H  . rivaroxaban  15 mg Oral Q supper   Continuous Infusions:   LOS: 9 days   Time spent= 35 mins    Oshua Mcconaha Arsenio Loader, MD [facility]  If 7PM-7AM, please contact night-coverage [email or web address] 03/12/2019, 10:46 AM

## 2019-03-13 LAB — CBC
HEMATOCRIT: 34.4 % — AB (ref 39.0–52.0)
Hemoglobin: 10.7 g/dL — ABNORMAL LOW (ref 13.0–17.0)
MCH: 31.2 pg (ref 26.0–34.0)
MCHC: 31.1 g/dL (ref 30.0–36.0)
MCV: 100.3 fL — ABNORMAL HIGH (ref 80.0–100.0)
Platelets: 266 10*3/uL (ref 150–400)
RBC: 3.43 MIL/uL — ABNORMAL LOW (ref 4.22–5.81)
RDW: 15.8 % — ABNORMAL HIGH (ref 11.5–15.5)
WBC: 5.9 10*3/uL (ref 4.0–10.5)
nRBC: 0 % (ref 0.0–0.2)

## 2019-03-13 LAB — BASIC METABOLIC PANEL
Anion gap: 12 (ref 5–15)
BUN: 74 mg/dL — ABNORMAL HIGH (ref 8–23)
CHLORIDE: 103 mmol/L (ref 98–111)
CO2: 27 mmol/L (ref 22–32)
CREATININE: 2.46 mg/dL — AB (ref 0.61–1.24)
Calcium: 8.6 mg/dL — ABNORMAL LOW (ref 8.9–10.3)
GFR calc Af Amer: 27 mL/min — ABNORMAL LOW (ref >60)
GFR calc non Af Amer: 23 mL/min — ABNORMAL LOW (ref >60)
Glucose, Bld: 180 mg/dL — ABNORMAL HIGH (ref 70–99)
Potassium: 3.8 mmol/L (ref 3.5–5.1)
Sodium: 142 mmol/L (ref 135–145)

## 2019-03-13 LAB — MAGNESIUM: Magnesium: 1.9 mg/dL (ref 1.7–2.4)

## 2019-03-13 NOTE — Progress Notes (Signed)
PT Cancellation Note  Patient Details Name: David Barton MRN: [id number] DOB: [birth year]   Cancelled Treatment:    Reason Eval/Treat Not Completed: Patient declined, no reason specified patient very politely declines PT as his lunch has just arrived. He reports he is very motivated to participate with PT and requests that therapist return in afternoon. Will attempt to return if time/schedule allow.    Deniece Ree PT, DPT, CBIS  Supplemental Physical Therapist [facility]    Pager [phone] Acute Rehab Office [phone]

## 2019-03-13 NOTE — Progress Notes (Signed)
Physical Therapy Treatment Patient Details Name: David Barton MRN: [id number] DOB: [birth year] Today's Date: 03/13/2019    History of Present Illness 85 year old man prior tobacco, psvt, systolic hf ef 43-32% with normalization to 50's osa not on therapy, with a 4-day history of increasing shortness of breath with productive cough.  Flu swab negative at his physician's office on 3/12.  On Tamiflu empirically.  Developed fever and rigors 3/13 and was increasingly short of breath and altered this morning prompting his wife to call EMS. Prior history of pneumonia on background of COPD from remote smoking. No recent travel, no sick contacts.    PT Comments    Patient received in chair, very pleasant and motivated to participate in skilled PT session today. Able to complete functional transfers with min guard and gait 61f x2 with min guard and RW. Gait distance was however limited by HR elevating to as much as 133BPM with gait and SPO2 desat to 84% even on 8LPM O2 with activity. Patient asymptomatic throughout gait and mobility. Education provided on importance of appropriate oxygen saturation and HR with mobility. He was left up in the chair with all needs met, chair alarm active this afternoon, back to 5LPM at rest with SpO2 at 91%.     Follow Up Recommendations  CIR     Equipment Recommendations  Other (comment)(TBD )    Recommendations for Other Services       Precautions / Restrictions Precautions Precautions: Fall Precaution Comments: watch O2 Sats and HR  Restrictions Weight Bearing Restrictions: No    Mobility  Bed Mobility               General bed mobility comments: OOB in chair   Transfers Overall transfer level: Needs assistance Equipment used: Rolling walker (2 wheeled) Transfers: Sit to/from Stand Sit to Stand: Min guard         General transfer comment: min gaurd for sit to stand, improved mechancis and no posterior LOB noted    Ambulation/Gait Ambulation/Gait assistance: Min guard Gait Distance (Feet): 40 Feet(x2 ) Assistive device: Rolling walker (2 wheeled) Gait Pattern/deviations: Step-through pattern;Decreased step length - right;Decreased step length - left;Trunk flexed Gait velocity: decreased   General Gait Details: cues for pursed lip breathing and safe pace of gait. required 8LPM O2 but still desaturated to 84%, HR to 133BPM. All vitals recovered with seated rest.    Stairs             Wheelchair Mobility    Modified Rankin (Stroke Patients Only)       Balance Overall balance assessment: Needs assistance Sitting-balance support: Feet supported Sitting balance-Leahy Scale: Good     Standing balance support: Bilateral upper extremity supported Standing balance-Leahy Scale: Fair                              Cognition Arousal/Alertness: Awake/alert Behavior During Therapy: WFL for tasks assessed/performed Overall Cognitive Status: Within Functional Limits for tasks assessed                                        Exercises      General Comments        Pertinent Vitals/Pain Pain Assessment: No/denies pain Faces Pain Scale: No hurt    Home Living  Prior Function            PT Goals (current goals can now be found in the care plan section) Acute Rehab PT Goals Patient Stated Goal: get stronger PT Goal Formulation: With patient Time For Goal Achievement: 03/23/19 Potential to Achieve Goals: Good Progress towards PT goals: Progressing toward goals    Frequency    Min 3X/week      PT Plan Current plan remains appropriate    Co-evaluation              AM-PAC PT "6 Clicks" Mobility   Outcome Measure  Help needed turning from your back to your side while in a flat bed without using bedrails?: A Little Help needed moving from lying on your back to sitting on the side of a flat bed without using  bedrails?: A Lot Help needed moving to and from a bed to a chair (including a wheelchair)?: A Little Help needed standing up from a chair using your arms (e.g., wheelchair or bedside chair)?: A Little Help needed to walk in hospital room?: A Little Help needed climbing 3-5 steps with a railing? : A Lot 6 Click Score: 16    End of Session Equipment Utilized During Treatment: Oxygen Activity Tolerance: Patient tolerated treatment well Patient left: in chair;with call bell/phone within reach;with chair alarm set Nurse Communication: Mobility status;Other (comment)(oxygen desaturation ) PT Visit Diagnosis: Unsteadiness on feet (R26.81)     Time: 1349-1412 PT Time Calculation (min) (ACUTE ONLY): 23 min  Charges:  $Gait Training: 8-22 mins $Self Care/Home Management: 8-22                     Kristen Unger PT, DPT, CBIS  Supplemental Physical Therapist [place]    Pager [phone] Acute Rehab Office [phone]    

## 2019-03-13 NOTE — Progress Notes (Signed)
PROGRESS NOTE    David Barton  JFH:[id number] DOB: 01/29/1933 DOA: 03/03/2019 PCP: Maury Dus, MD   Brief Narrative:  85 year old with history of COPD/emphysema, PSVT, essential hypertension, systolic CHF with ejection fraction 30-35%, alcohol abuse, colon cancer admitted from [facility] due to acute hypoxic respiratory failure and septic shock with acute kidney injury.  Initially requiring intubation and pressors.  He was started on broad-spectrum antibiotics.  Eventually extubated on 03/06/2019.  Influenza swab was negative.  But prior to that had been receiving Tamiflu.  There was a high suspicion for COVID19 which was ruled out.  Patient has been making slow improvement over the course the last 4 days.  He has made significant improvement in terms of weaning down his oxygen to 6 L high flow.  Hopefully we can transition him to CIR over next 24-48 hours, if maximum further improvement he may be amenable to go home with home health.   Assessment & Plan:   Active Problems:   Acute respiratory failure ([name])   COPD with acute exacerbation ([name])   Essential hypertension   Community acquired pneumonia of right lung   Acute systolic CHF (congestive heart failure) ([name])   Pulmonary edema   Edema of both legs   Atrial fibrillation with RVR ([name])  Acute hypoxic respiratory failure secondary to community-acquired pneumonia, streptococcal; still on 6HF [place] Abnormal breath sounds - Slow to improve. ProCal- 2.87. BNP slightly elevated.  Completed course of azithromycin, IV Rocephin.  Supplemental oxygen as needed, wean off oxygen. -Aggressive bronchodilator treatment, Pulmicort, IS/Flutter. - Continue IV Solu-Medrol.  Acute on chronic pressure congestive heart failure, ejection fraction 55%, class III Bilateral pleural effusion with infiltrate -Continue Lasix 40 mg orally twice daily.  Strictly monitor input and output.  Atrial fibrillation with mild RVR, persistent - Cardizem 240 mg daily  now.  Continue Xarelto.  Acute mild to moderate COPD exacerbation -Continue current bronchodilators with IV Rocephin.  Cont Solumedrol.  Acute kidney injury likely secondary to ATN -Cr slightly increased to 2.46.  Foley removed, now has condom catheter.  Doing well.  Bilateral upper extremity swelling -Suspect dependent edema.  Advised to raise his arm and put pillows underneath it.  Generalized weakness -Physical therapy recommended CIR.  After speaking with the physician on 3/23, they turned him down because he still required acute care.  We will see how he progresses over next 24-48 hours and then transition him to either CIR, SNF or home with home health.  DVT prophylaxis: Xarelto Code Status: DNR Family Communication: None at bedside Disposition Plan: Maintain hospital stay until we are able to wean him from oxygen as best as possible so he can participate well in physical therapy without desaturating.  Antimicrobials:   Rocephin   Subjective: Feels better. Shortness of breath improved. Able to walk with the walker in the hallway today.   Review of Systems Otherwise negative except as per HPI, including: General = no fevers, chills, dizziness, malaise, fatigue HEENT/EYES = negative for pain, redness, loss of vision, double vision, blurred vision, loss of hearing, sore throat, hoarseness, dysphagia Cardiovascular= negative for chest pain, palpitation, murmurs, lower extremity swelling Respiratory/lungs= negative for shortness of breath, cough, hemoptysis, wheezing, mucus production Gastrointestinal= negative for nausea, vomiting,, abdominal pain, melena, hematemesis Genitourinary= negative for Dysuria, Hematuria, Change in Urinary Frequency MSK = Negative for arthralgia, myalgias, Back Pain, Joint swelling  Neurology= Negative for headache, seizures, numbness, tingling  Psychiatry= Negative for anxiety, depression, suicidal and homocidal ideation Allergy/Immunology=  Medication/Food allergy  as listed  Skin= Negative for Rash, lesions, ulcers, itching  Objective: Vitals:   03/13/19 0102 03/13/19 0415 03/13/19 0743 03/13/19 0753  BP: 131/85 136/83  (!) 142/80  Pulse: 91 100  98  Resp: (!) 21 (!) 21  19  Temp: (!) 97.3 F (36.3 C) (!) 97.4 F (36.3 C)  97.8 F (36.6 C)  TempSrc: Oral Oral  Axillary  SpO2: 93% 91% 94% 97%  Weight:      Height:        Intake/Output Summary (Last 24 hours) at 03/13/2019 0952 Last data filed at 03/13/2019 0926 Gross per 24 hour  Intake 780 ml  Output 2750 ml  Net -1970 ml   Filed Weights   03/11/19 0028 03/11/19 0500 03/12/19 0500  Weight: 113.4 kg 111.6 kg 110.9 kg    Examination: Constitutional: NAD, calm, comfortable; 6l HF [place] Eyes: PERRL, lids and conjunctivae normal ENMT: Mucous membranes are moist. Posterior pharynx clear of any exudate or lesions.Normal dentition.  Neck: normal, supple, no masses, no thyromegaly Respiratory: Diffuse diminished BS but improved.  Cardiovascular: Regular rate and rhythm, no murmurs / rubs / gallops. No extremity edema. 2+ pedal pulses. No carotid bruits.  Abdomen: no tenderness, no masses palpated. No hepatosplenomegaly. Bowel sounds positive.  Musculoskeletal: no clubbing / cyanosis. No joint deformity upper and lower extremities. Good ROM, no contractures. Normal muscle tone.  Skin: no rashes, lesions, ulcers. No induration Neurologic: CN 2-12 grossly intact. Sensation intact, DTR normal. Strength 4/5 in all 4.  Psychiatric: Normal judgment and insight. Alert and oriented x 3. Normal mood.    Data Reviewed:   CBC: Recent Labs  Lab 03/09/19 0359 03/10/19 1130 03/10/19 1821 03/11/19 0438 03/12/19 0547 03/13/19 0422  WBC 7.6 4.9  --  8.5 7.3 5.9  HGB 11.6* 11.6*  --  11.5* 11.5* 10.7*  HCT 37.7* 38.8* 36.6* 36.9* 36.0* 34.4*  MCV 106.2* 106.0*  --  103.9* 101.4* 100.3*  PLT 261 259  --  302 276 588   Basic Metabolic Panel: Recent Labs  Lab 03/10/19 1130  03/10/19 1821 03/11/19 0438 03/12/19 0547 03/13/19 0422  NA 143 144 142 140 142  K 4.6 4.2 3.8 3.5 3.8  CL 112* 108 107 105 103  CO2 25 26 26 24 27   GLUCOSE 233* 226* 174* 176* 180*  BUN 51* 55* 59* 68* 74*  CREATININE 2.10* 2.42* 2.29* 2.47* 2.46*  CALCIUM 8.9 9.4 9.0 8.6* 8.6*  MG 1.8 1.6* 1.7 1.5* 1.9   GFR: Estimated Creatinine Clearance: 28.8 mL/min (A) (by C-G formula based on SCr of 2.46 mg/dL (H)). Liver Function Tests: No results for input(s): AST, ALT, ALKPHOS, BILITOT, PROT, ALBUMIN in the last 168 hours. No results for input(s): LIPASE, AMYLASE in the last 168 hours. No results for input(s): AMMONIA in the last 168 hours. Coagulation Profile: No results for input(s): INR, PROTIME in the last 168 hours. Cardiac Enzymes: No results for input(s): CKTOTAL, CKMB, CKMBINDEX, TROPONINI in the last 168 hours. BNP (last 3 results) No results for input(s): PROBNP in the last 8760 hours. HbA1C: No results for input(s): HGBA1C in the last 72 hours. CBG: Recent Labs  Lab 03/07/19 0003 03/07/19 0408 03/07/19 0806 03/07/19 1128 03/07/19 1550  GLUCAP 107* 96 89 112* 96   Lipid Profile: No results for input(s): CHOL, HDL, LDLCALC, TRIG, CHOLHDL, LDLDIRECT in the last 72 hours. Thyroid Function Tests: No results for input(s): TSH, T4TOTAL, FREET4, T3FREE, THYROIDAB in the last 72 hours. Anemia Panel: Recent Labs  03/10/19 1821  VITAMINB12 929*   Sepsis Labs: Recent Labs  Lab 03/09/19 0359  PROCALCITON 2.87    Recent Results (from the past 240 hour(s))  Blood Culture (routine x 2)     Status: None   Collection Time: 03/03/19 10:38 AM  Result Value Ref Range Status   Specimen Description BLOOD LEFT HAND  Final   Special Requests AEROBIC BOTTLE ONLY Blood Culture adequate volume  Final   Culture   Final    NO GROWTH 5 DAYS Performed at [facility], [place] [address] [place], [place] [address]    Report Status 03/08/2019 FINAL  Final  MRSA PCR Screening      Status: None   Collection Time: 03/03/19  3:59 PM  Result Value Ref Range Status   MRSA by PCR NEGATIVE NEGATIVE Final    Comment:        The GeneXpert MRSA Assay (FDA approved for NASAL specimens only), is one component of a comprehensive MRSA colonization surveillance program. It is not intended to diagnose MRSA infection nor to guide or monitor treatment for MRSA infections. Performed at [facility], [place] [address], [place], [place] [address]   Culture, respiratory (non-expectorated)     Status: None   Collection Time: 03/03/19  6:38 PM  Result Value Ref Range Status   Specimen Description TRACHEAL ASPIRATE  Final   Special Requests NONE  Final   Gram Stain   Final    RARE WBC PRESENT, PREDOMINANTLY PMN FEW GRAM POSITIVE COCCI IN PAIRS Performed at [facility], [place] [address], [place], [place] [address]    Culture FEW STREPTOCOCCUS PNEUMONIAE  Final   Report Status 03/06/2019 FINAL  Final   Organism ID, Bacteria STREPTOCOCCUS PNEUMONIAE  Final      Susceptibility   Streptococcus pneumoniae - MIC*    ERYTHROMYCIN >=8 RESISTANT Resistant     LEVOFLOXACIN 1 SENSITIVE Sensitive     VANCOMYCIN 0.5 SENSITIVE Sensitive     PENICILLIN (non-meningitis) 1 SENSITIVE Sensitive     CEFTRIAXONE (non-meningitis) 1 SENSITIVE Sensitive     * FEW STREPTOCOCCUS PNEUMONIAE         Radiology Studies: No results found.      Scheduled Meds: . arformoterol  15 mcg Nebulization BID  . budesonide (PULMICORT) nebulizer solution  0.5 mg Nebulization BID  . diltiazem  240 mg Oral Daily  . docusate sodium  100 mg Oral BID  . feeding supplement (ENSURE ENLIVE)  237 mL Oral BID BM  . furosemide  40 mg Oral BID  . ipratropium  0.5 mg Nebulization BID  . levalbuterol  0.63 mg Nebulization TID  . mouth rinse  15 mL Mouth Rinse BID  . methylPREDNISolone (SOLU-MEDROL) injection  40 mg Intravenous Q8H  . rivaroxaban  15 mg Oral Q supper   Continuous Infusions:   LOS:  10 days   Time spent= 35 mins    Ankit Arsenio Loader, MD [facility]  If 7PM-7AM, please contact night-coverage [email or web address] 03/13/2019, 9:52 AM

## 2019-03-13 NOTE — Progress Notes (Signed)
Inpatient Rehabilitation-Admissions Coordinator   Per Dr. Reesa Chew, peer to peer was completed with denial upheld due to pt still requiring acute care. AC has received faxed denial for CIR from pt's insurance carrier. Pt has been updated pt on the determination.   Please see Attending MD note from 3/24. I will continue to follow while pt continues with his medical workup.   Jhonnie Garner, OTR/L  Rehab Admissions Coordinator  [phone] 03/13/2019 12:17 PM

## 2019-03-13 NOTE — Progress Notes (Signed)
Patient HR is in 110s - 120s since this AM. Patient is asymptomatic, no complaints. MD notified. Will continue to monitor.

## 2019-03-13 NOTE — Progress Notes (Signed)
Occupational Therapy Treatment Patient Details Name: David Barton MRN: [id number] DOB: December 01, 1933 Today's Date: 03/13/2019    History of present illness 85 year old man prior tobacco, psvt, systolic hf ef 47-09% with normalization to 50's osa not on therapy, with a 4-day history of increasing shortness of breath with productive cough.  Flu swab negative at his physician's office on 3/12.  On Tamiflu empirically.  Developed fever and rigors 3/13 and was increasingly short of breath and altered this morning prompting his wife to call EMS. Prior history of pneumonia on background of COPD from remote smoking. No recent travel, no sick contacts.   OT comments  Pt completed ADL at sink level with min A on 6L. Desat to 87 and max HR 133. DOE 2/4. Pt fatigues but able to complete task. Educated on pursed lip breathing. Will continueto follow acutely to facilitate safe DC to next venue of care.   Follow Up Recommendations  CIR    Equipment Recommendations  None recommended by OT    Recommendations for Other Services Rehab consult    Precautions / Restrictions Precautions Precautions: Fall Precaution Comments: watch O2 Sats and HR  Restrictions Weight Bearing Restrictions: No       Mobility Bed Mobility Overal bed mobility: Modified Independent               Transfers Overall transfer level: Needs assistance Equipment used:  Transfers: Sit to/from Stand Sit to Stand: Supervision           Balance Overall balance assessment: Needs assistance Sitting-balance support: Feet supported Sitting balance-Leahy Scale: Good     Standing balance support: Bilateral upper extremity supported Standing balance-Leahy Scale: Fair                             ADL either performed or assessed with clinical judgement   ADL Overall ADL's : Needs assistance/impaired     Grooming: Set up;Sitting   Upper Body Bathing: Set up;Sitting   Lower Body Bathing: Minimal  assistance;Sit to/from stand Lower Body Bathing Details (indicate cue type and reason): A for lower legs/feet Upper Body Dressing : Minimal assistance   Lower Body Dressing: Minimal assistance Lower Body Dressing Details (indicate cue type and reason): A for socks Toilet Transfer: Supervision/safety;Ambulation     Toileting - Clothing Manipulation Details (indicate cue type and reason): wearing condom cath which fell off; A to clean up     Functional mobility during ADLs: Min guard General ADL Comments: 2/4 DOE. Desat to 26 6L     Vision       Perception     Praxis      Cognition Arousal/Alertness: Awake/alert Behavior During Therapy: WFL for tasks assessed/performed Overall Cognitive Status: Within Functional Limits for tasks assessed                                          Exercises Other Exercises Other Exercises: pursed lip breathing Other Exercises: encouraged use of incentive spirometer   Shoulder Instructions       General Comments      Pertinent Vitals/ Pain       Pain Assessment: No/denies pain Faces Pain Scale: No hurt  Home Living  Prior Functioning/Environment              Frequency  Min 3X/week        Progress Toward Goals  OT Goals(current goals can now be found in the care plan section)  Progress towards OT goals: Progressing toward goals  Acute Rehab OT Goals Patient Stated Goal: get stronger OT Goal Formulation: With patient Time For Goal Achievement: 03/17/19 Potential to Achieve Goals: Good ADL Goals Pt Will Perform Lower Body Bathing: with modified independence;sit to/from stand Pt Will Transfer to Toilet: with modified independence;ambulating Pt Will Perform Tub/Shower Transfer: with modified independence;ambulating Additional ADL Goal #1: Pt will independently verbalize 3 energy conservation strategies Additional ADL Goal #2: Pt will  independently verbalize 3 strategies to prevent falls  Plan Discharge plan remains appropriate;Frequency needs to be updated    Co-evaluation                 AM-PAC OT "6 Clicks" Daily Activity     Outcome Measure   Help from another person eating meals?: None Help from another person taking care of personal grooming?: None Help from another person toileting, which includes using toliet, bedpan, or urinal?: A Little Help from another person bathing (including washing, rinsing, drying)?: A Little Help from another person to put on and taking off regular upper body clothing?: A Little Help from another person to put on and taking off regular lower body clothing?: A Little 6 Click Score: 20    End of Session Equipment Utilized During Treatment: Oxygen(6L)  OT Visit Diagnosis: Unsteadiness on feet (R26.81);Repeated falls (R29.6);Pain   Activity Tolerance Patient tolerated treatment well   Patient Left in bed;with call bell/phone within reach;with bed alarm set   Nurse Communication Mobility status        Time: 8921-1941 OT Time Calculation (min): 40 min  Charges: OT General Charges $OT Visit: 1 Visit OT Treatments $Self Care/Home Management : 38-52 mins  Maurie Boettcher, OT/L   Acute OT Clinical Specialist [place] Pager [phone] Office [phone]    [facility] 03/13/2019, 4:22 PM

## 2019-03-14 LAB — CBC
HCT: 37.6 % — ABNORMAL LOW (ref 39.0–52.0)
Hemoglobin: 11.7 g/dL — ABNORMAL LOW (ref 13.0–17.0)
MCH: 31.3 pg (ref 26.0–34.0)
MCHC: 31.1 g/dL (ref 30.0–36.0)
MCV: 100.5 fL — ABNORMAL HIGH (ref 80.0–100.0)
Platelets: 283 10*3/uL (ref 150–400)
RBC: 3.74 MIL/uL — AB (ref 4.22–5.81)
RDW: 15.7 % — ABNORMAL HIGH (ref 11.5–15.5)
WBC: 6.4 10*3/uL (ref 4.0–10.5)
nRBC: 0 % (ref 0.0–0.2)

## 2019-03-14 LAB — BASIC METABOLIC PANEL
Anion gap: 8 (ref 5–15)
BUN: 79 mg/dL — ABNORMAL HIGH (ref 8–23)
CO2: 29 mmol/L (ref 22–32)
Calcium: 8.1 mg/dL — ABNORMAL LOW (ref 8.9–10.3)
Chloride: 103 mmol/L (ref 98–111)
Creatinine, Ser: 2.4 mg/dL — ABNORMAL HIGH (ref 0.61–1.24)
GFR calc Af Amer: 28 mL/min — ABNORMAL LOW (ref >60)
GFR calc non Af Amer: 24 mL/min — ABNORMAL LOW (ref >60)
GLUCOSE: 190 mg/dL — AB (ref 70–99)
Potassium: 3.6 mmol/L (ref 3.5–5.1)
Sodium: 140 mmol/L (ref 135–145)

## 2019-03-14 LAB — MAGNESIUM: Magnesium: 1.7 mg/dL (ref 1.7–2.4)

## 2019-03-14 LAB — BRAIN NATRIURETIC PEPTIDE: B Natriuretic Peptide: 457.1 pg/mL — ABNORMAL HIGH (ref 0.0–100.0)

## 2019-03-14 MED ORDER — POTASSIUM CHLORIDE CRYS ER 20 MEQ PO TBCR
40.0000 meq | EXTENDED_RELEASE_TABLET | Freq: Once | ORAL | Status: AC
  Administered 2019-03-14: 40 meq via ORAL
  Filled 2019-03-14: qty 2

## 2019-03-14 MED ORDER — MAGNESIUM OXIDE 400 (241.3 MG) MG PO TABS
800.0000 mg | ORAL_TABLET | ORAL | Status: AC
  Administered 2019-03-14 (×2): 800 mg via ORAL
  Filled 2019-03-14 (×2): qty 2

## 2019-03-14 MED ORDER — LEVALBUTEROL HCL 1.25 MG/0.5ML IN NEBU
1.2500 mg | INHALATION_SOLUTION | Freq: Three times a day (TID) | RESPIRATORY_TRACT | Status: DC
  Administered 2019-03-14: 1.25 mg via RESPIRATORY_TRACT
  Filled 2019-03-14 (×2): qty 0.5

## 2019-03-14 MED ORDER — LEVALBUTEROL HCL 1.25 MG/0.5ML IN NEBU
1.2500 mg | INHALATION_SOLUTION | Freq: Three times a day (TID) | RESPIRATORY_TRACT | Status: DC
  Administered 2019-03-14: 1.25 mg via RESPIRATORY_TRACT
  Filled 2019-03-14: qty 0.5

## 2019-03-14 NOTE — Progress Notes (Addendum)
Occupational Therapy Treatment Patient Details Name: David Barton MRN: [id number] DOB: [birth year] Today's Date: 03/14/2019    History of present illness 85 year old man prior tobacco, psvt, systolic hf ef 27-06% with normalization to 50's osa not on therapy, with a 4-day history of increasing shortness of breath with productive cough.  Flu swab negative at his physician's office on 3/12.  On Tamiflu empirically.  Developed fever and rigors 3/13 and was increasingly short of breath and altered this morning prompting his wife to call EMS. Prior history of pneumonia on background of COPD from remote smoking. No recent travel, no sick contacts.   OT comments  Pt performing transfers with supervisionA from various heights and surfaces. Pt performing ADl functional mobility in room with supervisionA and RW with fair balance. Pt stood x1 min at sink for light grooming, but HR reached 133 BPM so pt sat down for remainder of session. Pt performing ADL for UB at sink and set-upA only. Pt tolerating session with O2 sats 84-90% on 6-7L O2 and pursed lip breathing techniques required with reminder cues. Pt performing all tasks with increased time and good safety awareness. Pt able to state 3 energy conservation strategies that he plans to use d/p D/C. Pt could benefit from continued OT skilled services for ADL and progression of mobility and HEP in CIR. OT to follow acutely.   Follow Up Recommendations  CIR    Equipment Recommendations  None recommended by OT    Recommendations for Other Services      Precautions / Restrictions Precautions Precautions: Fall Precaution Comments: watch O2 Sats and HR  Restrictions Weight Bearing Restrictions: No       Mobility Bed Mobility Overal bed mobility: Modified Independent             General bed mobility comments: OOB in chair   Transfers Overall transfer level: Needs assistance   Transfers: Sit to/from Stand Sit to Stand: Supervision         General transfer comment: no LOB noted.    Balance     Sitting balance-Leahy Scale: Good       Standing balance-Leahy Scale: Fair                 High Level Balance Comments: Fair balance with challenges and RW           ADL either performed or assessed with clinical judgement   ADL Overall ADL's : Needs assistance/impaired     Grooming: Set up;Sitting   Upper Body Bathing: Set up;Sitting               Toilet Transfer: Supervision/safety;Ambulation     Toileting - Clothing Manipulation Details (indicate cue type and reason): wearing comdom cath     Functional mobility during ADLs: Min guard General ADL Comments: desat to 84-89% on 6L O2 hi-flo [place]. Pt with no SOB noted. O2 on 10L N upon arrival and pt holding sats 90%. Pt fluctuating from 85-90% on 6L O2 with activity and increased to 8L and o2 stayed the same.     Vision       Perception     Praxis      Cognition Arousal/Alertness: Awake/alert Behavior During Therapy: WFL for tasks assessed/performed Overall Cognitive Status: Within Functional Limits for tasks assessed  Exercises Other Exercises Other Exercises: pursed lip breathing Other Exercises: encouraged use of incentive spirometer + flutter valve. Other Exercises: chair push-ups x5 times   Shoulder Instructions       General Comments Pt tolerating session well with O2 desatting to mid 80s. New pulse ox applied to finger and O2 sats remained 90% on 6L O2 with no extension. pt with no SOB noted, but visibly fatigued easily.    Pertinent Vitals/ Pain       Pain Assessment: No/denies pain  Home Living                                          Prior Functioning/Environment              Frequency  Min 3X/week        Progress Toward Goals  OT Goals(current goals can now be found in the care plan section)  Progress towards OT goals: Progressing  toward goals  Acute Rehab OT Goals Patient Stated Goal: get stronger OT Goal Formulation: With patient Time For Goal Achievement: 03/17/19 Potential to Achieve Goals: Good ADL Goals Pt Will Perform Lower Body Bathing: with modified independence;sit to/from stand Pt Will Transfer to Toilet: with modified independence;ambulating Pt Will Perform Tub/Shower Transfer: with modified independence;ambulating Additional ADL Goal #1: Pt will independently verbalize 3 energy conservation strategies Additional ADL Goal #2: Pt will independently verbalize 3 strategies to prevent falls  Plan Discharge plan remains appropriate    Co-evaluation                 AM-PAC OT "6 Clicks" Daily Activity     Outcome Measure   Help from another person eating meals?: None Help from another person taking care of personal grooming?: None Help from another person toileting, which includes using toliet, bedpan, or urinal?: A Little Help from another person bathing (including washing, rinsing, drying)?: A Little Help from another person to put on and taking off regular upper body clothing?: A Little Help from another person to put on and taking off regular lower body clothing?: A Little 6 Click Score: 20    End of Session Equipment Utilized During Treatment: Oxygen  OT Visit Diagnosis: Unsteadiness on feet (R26.81);Repeated falls (R29.6);Pain   Activity Tolerance Patient tolerated treatment well   Patient Left in chair;with call bell/phone within reach   Nurse Communication Mobility status        Time: 3546-5681 OT Time Calculation (min): 43 min  Charges: OT General Charges $OT Visit: 1 Visit OT Treatments $Self Care/Home Management : 23-37 mins $Neuromuscular Re-education: 8-22 mins  Darryl Nestle) Marsa Aris OTR/L Acute Rehabilitation Services Pager: [phone] Office: [phone]   Fredda Hammed 03/14/2019, 10:54 AM

## 2019-03-14 NOTE — Progress Notes (Signed)
Inpatient Rehabilitation-Admissions Coordinator   [facility] has received notice from [employer] company that he has now been approved for CIR. AC has made MD, pt and his wife aware of approval. Currently, pt is not medically ready for admit to CIR; we will follow along for readiness. Pt and wife aware that he may progress to a point where he is no longer appropriate for CIR while we are awaiting medical readiness.   AC will continue to follow.   Jhonnie Garner, OTR/L  Rehab Admissions Coordinator  [phone] 03/14/2019 10:38 AM

## 2019-03-14 NOTE — Progress Notes (Signed)
Inpatient Diabetes Program Recommendations  AACE/ADA: New Consensus Statement on Inpatient Glycemic Control (2015)  Target Ranges:  Prepandial:   less than 140 mg/dL      Peak postprandial:   less than 180 mg/dL (1-2 hours)      Critically ill patients:  140 - 180 mg/dL   Lab Results  Component Value Date   GLUCAP 96 03/07/2019   HGBA1C 5.2 11/05/2016     Diabetes history: no hx of DM Current orders for Inpatient glycemic control: none  Solumedrol 40 mg Q8H  Inpatient Diabetes Program Recommendations:    Last A1C from 2017, consider repeating A1C?  Additionally, in the setting of steroids, consider adding Novolog 0-9 units TID & Novolog 0-5 units QHS.   Thanks, Bronson Curb, MSN, RNC-OB Diabetes Coordinator [phone] (8a-5p)

## 2019-03-14 NOTE — Progress Notes (Signed)
PT Cancellation Note  Patient Details Name: David Barton MRN: [id number] DOB: 1933-06-01   Cancelled Treatment:    Reason Eval/Treat Not Completed: Other (comment) patient just finishing with OT and requesting rest break before PT. Will attempt to return as time/schedule allow.    Deniece Ree PT, DPT, CBIS  Supplemental Physical Therapist [facility]    Pager [phone] Acute Rehab Office [phone]

## 2019-03-14 NOTE — Progress Notes (Signed)
PROGRESS NOTE    David Barton  NFA:[id number] DOB: 10-Mar-1933 DOA: 03/03/2019 PCP: Maury Dus, MD   Brief Narrative:  85 year old with history of COPD/emphysema, PSVT, essential hypertension, systolic CHF with ejection fraction 30-35%, alcohol abuse, colon cancer admitted from [facility] due to acute hypoxic respiratory failure and septic shock with acute kidney injury.  Initially requiring intubation and pressors.  He was started on broad-spectrum antibiotics.  Eventually extubated on 03/06/2019.  Influenza swab was negative.  But prior to that had been receiving Tamiflu.  There was a high suspicion for COVID19 which was ruled out.  Patient has been making slow improvement over the course the last 4 days.  He has made significant improvement in terms of weaning down his oxygen to 6 L high flow.     Assessment & Plan:   Active Problems:   Acute respiratory failure ([name])   COPD with acute exacerbation ([name])   Essential hypertension   Community acquired pneumonia of right lung   Acute systolic CHF (congestive heart failure) ([name])   Pulmonary edema   Edema of both legs   Atrial fibrillation with RVR ([name])  Acute hypoxic respiratory failure secondary to community-acquired pneumonia, streptococcal; still on 6+LHF [place] Abnormal breath sounds - Slow to improve. ProCal- 2.87. BNP slightly elevated.  Completed course of azithromycin, IV Rocephin.  Supplemental oxygen as needed, wean off oxygen. -Aggressive bronchodilator treatment, Pulmicort, IS/Flutter. Increased Dose of Xopenex.  - Cont IV Solumedrol  Acute on chronic pressure congestive heart failure, ejection fraction 55%, class III Bilateral pleural effusion with infiltrate -Continue Lasix 40mg  PO BID.  Strictly monitor input and output.  Atrial fibrillation with mild RVR, persistent - Cardizem 240 mg daily now.  Continue Xarelto.  Acute mild to moderate COPD exacerbation -Continue current bronchodilators with IV Rocephin.  Cont  Solumedrol.  Acute kidney injury likely secondary to ATN -Cr slightly increased to 2.40.  Foley removed, now has condom catheter.  Doing well.  Bilateral upper extremity swelling -Suspect dependent edema.  Advised to raise his arm and put pillows underneath it.  Generalized weakness -Physical therapy recommended CIR.  Will evaluate him day to day. Once his O2 requirement is low- will send him to CIR.   DVT prophylaxis: Xarelto Code Status: DNR Family Communication: None at bedside Disposition Plan: Maintain hosp stay for now until we can wean him off O2.   Antimicrobials:   Rocephin   Subjective: Feels ok, but still gets short of breath with exertion. Yesterday had an episode of slightly dfizziness when ambulating.   Review of Systems Otherwise negative except as per HPI, including: General = no fevers, chills, dizziness, malaise, fatigue HEENT/EYES = negative for pain, redness, loss of vision, double vision, blurred vision, loss of hearing, sore throat, hoarseness, dysphagia Cardiovascular= negative for chest pain, palpitation, murmurs, lower extremity swelling Respiratory/lungs= negative for  hemoptysis, wheezing, mucus production Gastrointestinal= negative for nausea, vomiting,, abdominal pain, melena, hematemesis Genitourinary= negative for Dysuria, Hematuria, Change in Urinary Frequency MSK = Negative for arthralgia, myalgias, Back Pain, Joint swelling  Neurology= Negative for headache, seizures, numbness, tingling  Psychiatry= Negative for anxiety, depression, suicidal and homocidal ideation Allergy/Immunology= Medication/Food allergy as listed  Skin= Negative for Rash, lesions, ulcers, itching  Objective: Vitals:   03/14/19 0809 03/14/19 0853 03/14/19 0857 03/14/19 0858  BP:      Pulse:      Resp:      Temp: 97.8 F (36.6 C)     TempSrc: Oral     SpO2:  90% 90% 90%  Weight:      Height:        Intake/Output Summary (Last 24 hours) at 03/14/2019 1015 Last  data filed at 03/14/2019 0700 Gross per 24 hour  Intake 60 ml  Output 1500 ml  Net -1440 ml   Filed Weights   03/11/19 0028 03/11/19 0500 03/12/19 0500  Weight: 113.4 kg 111.6 kg 110.9 kg    Examination: Constitutional: NAD, calm, comfortable; 10L HF getting neb tx.  Eyes: PERRL, lids and conjunctivae normal ENMT: Mucous membranes are moist. Posterior pharynx clear of any exudate or lesions.Normal dentition.  Neck: normal, supple, no masses, no thyromegaly Respiratory: diffuse exp wheezing Cardiovascular: Regular rate and rhythm, no murmurs / rubs / gallops. No extremity edema. 2+ pedal pulses. No carotid bruits.  Abdomen: no tenderness, no masses palpated. No hepatosplenomegaly. Bowel sounds positive.  Musculoskeletal: no clubbing / cyanosis. No joint deformity upper and lower extremities. Good ROM, no contractures. Normal muscle tone.  Skin: no rashes, lesions, ulcers. No induration Neurologic: CN 2-12 grossly intact. Sensation intact, DTR normal. Strength 4/5 in all 4.  Psychiatric: Normal judgment and insight. Alert and oriented x 3. Normal mood.    Data Reviewed:   CBC: Recent Labs  Lab 03/10/19 1130 03/10/19 1821 03/11/19 0438 03/12/19 0547 03/13/19 0422 03/14/19 0405  WBC 4.9  --  8.5 7.3 5.9 6.4  HGB 11.6*  --  11.5* 11.5* 10.7* 11.7*  HCT 38.8* 36.6* 36.9* 36.0* 34.4* 37.6*  MCV 106.0*  --  103.9* 101.4* 100.3* 100.5*  PLT 259  --  302 276 266 888   Basic Metabolic Panel: Recent Labs  Lab 03/10/19 1821 03/11/19 0438 03/12/19 0547 03/13/19 0422 03/14/19 0405  NA 144 142 140 142 140  K 4.2 3.8 3.5 3.8 3.6  CL 108 107 105 103 103  CO2 26 26 24 27 29   GLUCOSE 226* 174* 176* 180* 190*  BUN 55* 59* 68* 74* 79*  CREATININE 2.42* 2.29* 2.47* 2.46* 2.40*  CALCIUM 9.4 9.0 8.6* 8.6* 8.1*  MG 1.6* 1.7 1.5* 1.9 1.7   GFR: Estimated Creatinine Clearance: 29.5 mL/min (A) (by C-G formula based on SCr of 2.4 mg/dL (H)). Liver Function Tests: No results for  input(s): AST, ALT, ALKPHOS, BILITOT, PROT, ALBUMIN in the last 168 hours. No results for input(s): LIPASE, AMYLASE in the last 168 hours. No results for input(s): AMMONIA in the last 168 hours. Coagulation Profile: No results for input(s): INR, PROTIME in the last 168 hours. Cardiac Enzymes: No results for input(s): CKTOTAL, CKMB, CKMBINDEX, TROPONINI in the last 168 hours. BNP (last 3 results) No results for input(s): PROBNP in the last 8760 hours. HbA1C: No results for input(s): HGBA1C in the last 72 hours. CBG: Recent Labs  Lab 03/07/19 1128 03/07/19 1550  GLUCAP 112* 96   Lipid Profile: No results for input(s): CHOL, HDL, LDLCALC, TRIG, CHOLHDL, LDLDIRECT in the last 72 hours. Thyroid Function Tests: No results for input(s): TSH, T4TOTAL, FREET4, T3FREE, THYROIDAB in the last 72 hours. Anemia Panel: No results for input(s): VITAMINB12, FOLATE, FERRITIN, TIBC, IRON, RETICCTPCT in the last 72 hours. Sepsis Labs: Recent Labs  Lab 03/09/19 0359  PROCALCITON 2.87    No results found for this or any previous visit (from the past 240 hour(s)).       Radiology Studies: No results found.      Scheduled Meds:  arformoterol  15 mcg Nebulization BID   budesonide (PULMICORT) nebulizer solution  0.5 mg Nebulization BID  diltiazem  240 mg Oral Daily   docusate sodium  100 mg Oral BID   feeding supplement (ENSURE ENLIVE)  237 mL Oral BID BM   furosemide  40 mg Oral BID   ipratropium  0.5 mg Nebulization BID   levalbuterol  1.25 mg Nebulization Q8H   magnesium oxide  800 mg Oral Q4H   mouth rinse  15 mL Mouth Rinse BID   methylPREDNISolone (SOLU-MEDROL) injection  40 mg Intravenous Q8H   potassium chloride  40 mEq Oral Once   rivaroxaban  15 mg Oral Q supper   Continuous Infusions:   LOS: 11 days   Time spent= 35 mins    Josefine Fuhr Arsenio Loader, MD [facility]  If 7PM-7AM, please contact night-coverage [email or web address] 03/14/2019, 10:15 AM

## 2019-03-14 NOTE — Progress Notes (Signed)
Physical Therapy Treatment Patient Details Name: David Barton MRN: [id number] DOB: 02/05/1933 Today's Date: 03/14/2019    History of Present Illness 85 year old man prior tobacco, psvt, systolic hf ef 93-79% with normalization to 50's osa not on therapy, with a 4-day history of increasing shortness of breath with productive cough.  Flu swab negative at his physician's office on 3/12.  On Tamiflu empirically.  Developed fever and rigors 3/13 and was increasingly short of breath and altered this morning prompting his wife to call EMS. Prior history of pneumonia on background of COPD from remote smoking. No recent travel, no sick contacts.    PT Comments    Patient received in chair on 6LPM O2 with HR to 120BPM and SpO2 at 86%; increased oxygen to 8LPM and HR improved to 105BPM, SpO2 to 92%. Able to perform functional transfers and gait approximately 63f in room with RW and S, however this session severely limited by HR elevations and SpO2 desaturations on 8LPM (O2 drop to 80%, HR to as much as 142BPM). Vitals recovered to 115BPM HR and 90% SpO2 on 8LPM with return to seated rest. He was educated on limitations of this session and left up in the chair with alarm active and all needs otherwise met.     Follow Up Recommendations  CIR     Equipment Recommendations  Other (comment)(TBD )    Recommendations for Other Services       Precautions / Restrictions Precautions Precautions: Fall Precaution Comments: watch O2 Sats and HR  Restrictions Weight Bearing Restrictions: No    Mobility  Bed Mobility Overal bed mobility: Modified Independent             General bed mobility comments: OOB in chair   Transfers Overall transfer level: Needs assistance Equipment used: Rolling walker (2 wheeled) Transfers: Sit to/from Stand Sit to Stand: Supervision         General transfer comment: S for safety, no physical assist given   Ambulation/Gait Ambulation/Gait assistance:  Supervision Gait Distance (Feet): 30 Feet Assistive device: Rolling walker (2 wheeled) Gait Pattern/deviations: Step-through pattern;Decreased step length - right;Decreased step length - left;Trunk flexed Gait velocity: decreased   General Gait Details: required 10LPM for gait today and yet continued to desaturate into 80s and HR elevation to as much as 142BPM   Stairs             Wheelchair Mobility    Modified Rankin (Stroke Patients Only)       Balance Overall balance assessment: Needs assistance Sitting-balance support: Feet supported Sitting balance-Leahy Scale: Good     Standing balance support: Bilateral upper extremity supported Standing balance-Leahy Scale: Fair                 High Level Balance Comments: Fair balance with challenges and RW            Cognition Arousal/Alertness: Awake/alert Behavior During Therapy: WFL for tasks assessed/performed Overall Cognitive Status: Within Functional Limits for tasks assessed                                        Exercises Other Exercises Other Exercises: pursed lip breathing Other Exercises: encouraged use of incentive spirometer + flutter valve. Other Exercises: chair push-ups x5 times    General Comments General comments (skin integrity, edema, etc.): Pt tolerating session well with O2 desatting to mid 80s. New pulse ox applied  to finger and O2 sats remained 90% on 6L O2 with no extension. pt with no SOB noted, but visibly fatigued easily.      Pertinent Vitals/Pain Pain Assessment: No/denies pain Faces Pain Scale: No hurt    Home Living                      Prior Function            PT Goals (current goals can now be found in the care plan section) Acute Rehab PT Goals Patient Stated Goal: get stronger PT Goal Formulation: With patient Time For Goal Achievement: 03/23/19 Potential to Achieve Goals: Good Progress towards PT goals: Not progressing toward goals  - comment(limited by elevated HR/O2 desat )    Frequency    Min 3X/week      PT Plan Current plan remains appropriate    Co-evaluation              AM-PAC PT "6 Clicks" Mobility   Outcome Measure  Help needed turning from your back to your side while in a flat bed without using bedrails?: A Little Help needed moving from lying on your back to sitting on the side of a flat bed without using bedrails?: A Little Help needed moving to and from a bed to a chair (including a wheelchair)?: A Little Help needed standing up from a chair using your arms (e.g., wheelchair or bedside chair)?: A Little Help needed to walk in hospital room?: A Little Help needed climbing 3-5 steps with a railing? : A Lot 6 Click Score: 17    End of Session Equipment Utilized During Treatment: Oxygen Activity Tolerance: Treatment limited secondary to medical complications (Comment)(O2 desat and HR elevation ) Patient left: in chair;with call bell/phone within reach;with chair alarm set   PT Visit Diagnosis: Unsteadiness on feet (R26.81)     Time: 2589-4834 PT Time Calculation (min) (ACUTE ONLY): 20 min  Charges:  $Gait Training: 8-22 mins                     Deniece Ree PT, DPT, CBIS  Supplemental Physical Therapist [place]    Pager [phone] Acute Rehab Office [phone]

## 2019-03-15 ENCOUNTER — Encounter (HOSPITAL_COMMUNITY): Payer: Self-pay | Admitting: Cardiology

## 2019-03-15 ENCOUNTER — Inpatient Hospital Stay (HOSPITAL_COMMUNITY): Payer: Medicare Other

## 2019-03-15 DIAGNOSIS — I4891 Unspecified atrial fibrillation: Secondary | ICD-10-CM

## 2019-03-15 DIAGNOSIS — I5033 Acute on chronic diastolic (congestive) heart failure: Secondary | ICD-10-CM

## 2019-03-15 LAB — BASIC METABOLIC PANEL
Anion gap: 12 (ref 5–15)
BUN: 84 mg/dL — AB (ref 8–23)
CO2: 30 mmol/L (ref 22–32)
Calcium: 8.4 mg/dL — ABNORMAL LOW (ref 8.9–10.3)
Chloride: 99 mmol/L (ref 98–111)
Creatinine, Ser: 2.56 mg/dL — ABNORMAL HIGH (ref 0.61–1.24)
GFR calc Af Amer: 26 mL/min — ABNORMAL LOW (ref >60)
GFR calc non Af Amer: 22 mL/min — ABNORMAL LOW (ref >60)
Glucose, Bld: 193 mg/dL — ABNORMAL HIGH (ref 70–99)
Potassium: 3.9 mmol/L (ref 3.5–5.1)
Sodium: 141 mmol/L (ref 135–145)

## 2019-03-15 LAB — CBC
HCT: 36.6 % — ABNORMAL LOW (ref 39.0–52.0)
Hemoglobin: 11.6 g/dL — ABNORMAL LOW (ref 13.0–17.0)
MCH: 31.4 pg (ref 26.0–34.0)
MCHC: 31.7 g/dL (ref 30.0–36.0)
MCV: 99.2 fL (ref 80.0–100.0)
Platelets: 275 10*3/uL (ref 150–400)
RBC: 3.69 MIL/uL — AB (ref 4.22–5.81)
RDW: 15.6 % — ABNORMAL HIGH (ref 11.5–15.5)
WBC: 6.5 10*3/uL (ref 4.0–10.5)
nRBC: 0 % (ref 0.0–0.2)

## 2019-03-15 LAB — MAGNESIUM: Magnesium: 1.7 mg/dL (ref 1.7–2.4)

## 2019-03-15 MED ORDER — SODIUM CHLORIDE 0.9% FLUSH
3.0000 mL | Freq: Two times a day (BID) | INTRAVENOUS | Status: DC
  Administered 2019-03-15 – 2019-03-22 (×14): 3 mL via INTRAVENOUS

## 2019-03-15 MED ORDER — SODIUM CHLORIDE 0.9 % IV SOLN: 250.0000 mL | INTRAVENOUS | Status: DC

## 2019-03-15 MED ORDER — METOLAZONE 5 MG PO TABS
5.0000 mg | ORAL_TABLET | Freq: Once | ORAL | Status: AC
  Administered 2019-03-15: 5 mg via ORAL
  Filled 2019-03-15: qty 1

## 2019-03-15 MED ORDER — DILTIAZEM HCL ER COATED BEADS 180 MG PO CP24
300.0000 mg | ORAL_CAPSULE | Freq: Every day | ORAL | Status: DC
  Administered 2019-03-15: 300 mg via ORAL
  Filled 2019-03-15: qty 1

## 2019-03-15 MED ORDER — DILTIAZEM HCL ER COATED BEADS 180 MG PO CP24
360.0000 mg | ORAL_CAPSULE | Freq: Every day | ORAL | Status: DC
  Administered 2019-03-16 – 2019-03-17 (×2): 360 mg via ORAL
  Filled 2019-03-15 (×2): qty 2

## 2019-03-15 MED ORDER — SODIUM CHLORIDE 0.9% FLUSH: 3.0000 mL | INTRAVENOUS | Status: DC | PRN

## 2019-03-15 MED ORDER — LEVALBUTEROL HCL 1.25 MG/0.5ML IN NEBU
1.2500 mg | INHALATION_SOLUTION | Freq: Two times a day (BID) | RESPIRATORY_TRACT | Status: DC
  Administered 2019-03-15 – 2019-03-20 (×11): 1.25 mg via RESPIRATORY_TRACT
  Filled 2019-03-15 (×12): qty 0.5

## 2019-03-15 MED ORDER — DILTIAZEM HCL 60 MG PO TABS
60.0000 mg | ORAL_TABLET | Freq: Once | ORAL | Status: AC
  Administered 2019-03-15: 60 mg via ORAL
  Filled 2019-03-15: qty 1

## 2019-03-15 MED ORDER — DILTIAZEM HCL ER COATED BEADS 180 MG PO CP24: 360.0000 mg | ORAL_CAPSULE | Freq: Every day | ORAL | Status: DC

## 2019-03-15 NOTE — Care Management Important Message (Signed)
Important Message  Patient Details  Name: David Barton MRN: [id number] Date of Birth: [birth year]   Medicare Important Message Given:  Yes    Brunetta Newingham [address] 03/15/2019, 3:35 PM

## 2019-03-15 NOTE — Progress Notes (Signed)
Physical Therapy Treatment Patient Details Name: David Barton MRN: [id number] DOB: Aug 29, 1933 Today's Date: 03/15/2019    History of Present Illness 85 year old man prior tobacco, psvt, systolic hf ef 15-17% with normalization to 50's osa not on therapy, with a 4-day history of increasing shortness of breath with productive cough.  Flu swab negative at his physician's office on 3/12.  On Tamiflu empirically.  Developed fever and rigors 3/13 and was increasingly short of breath and altered this morning prompting his wife to call EMS. Prior history of pneumonia on background of COPD from remote smoking. No recent travel, no sick contacts.    PT Comments    Upon PT entry to room, patient found to be between 88-92% SPO2 on 8LPM at rest and in A-fib with RVR ranging from 105-130BPM at rest while patient was conversing with PT. Also noted and verified intermittent bouts of V-tach at rest per cardiac rhythm monitor. Deferred mobility or exercise with PT today due to vitals and heart rhythm, however did provide education on current status in relation to exercise, why therapy is choosing to hold activity today given current vitals, importance of SpO2 and HR being in safe range, age predicted HR max, and potential PT POC moving forward. Patient in agreement to all education provided today. He was left up in the chair with all needs met this morning.    Follow Up Recommendations  CIR     Equipment Recommendations  Other (comment)(TBD )    Recommendations for Other Services       Precautions / Restrictions Precautions Precautions: Fall Precaution Comments: watch O2 Sats and HR  Restrictions Weight Bearing Restrictions: No    Mobility  Bed Mobility               General bed mobility comments: OOB in chair   Transfers                 General transfer comment: deferred   Ambulation/Gait             General Gait Details: deferred    Stairs              Wheelchair Mobility    Modified Rankin (Stroke Patients Only)       Balance Overall balance assessment: Needs assistance Sitting-balance support: Feet supported Sitting balance-Leahy Scale: Good                                      Cognition Arousal/Alertness: Awake/alert Behavior During Therapy: WFL for tasks assessed/performed Overall Cognitive Status: Within Functional Limits for tasks assessed                                        Exercises      General Comments        Pertinent Vitals/Pain Pain Assessment: No/denies pain Faces Pain Scale: No hurt    Home Living                      Prior Function            PT Goals (current goals can now be found in the care plan section) Acute Rehab PT Goals Patient Stated Goal: get stronger PT Goal Formulation: With patient Time For Goal Achievement: 03/23/19 Potential to Achieve Goals: Good Progress towards  PT goals: Not progressing toward goals - comment(limited by O2 desat at rest and A-fib in RVR )    Frequency    Min 3X/week      PT Plan Current plan remains appropriate    Co-evaluation              AM-PAC PT "6 Clicks" Mobility   Outcome Measure  Help needed turning from your back to your side while in a flat bed without using bedrails?: A Little Help needed moving from lying on your back to sitting on the side of a flat bed without using bedrails?: A Little Help needed moving to and from a bed to a chair (including a wheelchair)?: A Little Help needed standing up from a chair using your arms (e.g., wheelchair or bedside chair)?: A Little Help needed to walk in hospital room?: A Little Help needed climbing 3-5 steps with a railing? : A Lot 6 Click Score: 17    End of Session Equipment Utilized During Treatment: Oxygen Activity Tolerance: Treatment limited secondary to medical complications (Comment)(O2 desat on 8LPM at rest and A-fib in RVR  ) Patient left: in chair;with call bell/phone within reach;with chair alarm set   PT Visit Diagnosis: Unsteadiness on feet (R26.81)     Time: 1005-1021 PT Time Calculation (min) (ACUTE ONLY): 16 min  Charges:  $Self Care/Home Management: 8-22                     Kristen Unger PT, DPT, CBIS  Supplemental Physical Therapist [place]    Pager [phone] Acute Rehab Office [phone]    

## 2019-03-15 NOTE — Consult Note (Addendum)
Cardiology Consultation:   Patient ID: David Barton MRN: [id number]; DOB: 11/14/1933  Admit date: 03/03/2019 Date of Consult: 03/15/2019  Primary Care Provider: Maury Dus, MD Primary Cardiologist: David Burow, MD  Primary Electrophysiologist:  None    Patient Profile:   David Barton is a 85 y.o. male with a hx of sepsis in 11/2016 (PNA) with elevated tropoinins (0.14 &0.18) and PSVT and EF 30-35% though improved to 55-60%, HTN, chronic diastolic HF, colon cancer,COPD who is being seen today for the evaluation of atrial fib in combination with acute respiratory failure, septic shock and AKI at the request of David Barton.  History of Present Illness:   David Barton with above his and sepsis in 11/2016 and with that EF down to 30-35%, PSVT and improved EF on echo 12/27/18 and 03/07/19.  Pt was stable on last visit with Dr. Adora Barton in 12/2018.  Pt admitted 03/04/19 for increasing SOB and prod cough, fever, rigors. Neg Flu and Neg Covid 19 test.  Pt states it came on suddenly was sitting outside and then felt bad with confusion.  Admitted with acute hypoxemic respiratory failure placed on vent now extubated.  Treated for CAP, septic shock, acute encephalopathy and AKI.   He is now improved and on 5W and is DNR.  His oxygen is down to 6 L. Is on solumedrol.  Continues on lasix 40 BID po  He does have a fib RVR and persistent.  dilt po at 300 mg daily, on Xarelto  Remains in a fib.  Also on the 20th NSVT 11 beats.   EKG:  The EKG was personally reviewed and demonstrates:  On 03/03/19 was in SR then to a fib follow up EKGS with a fib rates 144 to 120. Telemetry:  Telemetry was personally reviewed and demonstrates:  A fib rate 120s while talking to me  BNP on the 20th 546 and on the 25th 457. No troponins drawn Na 141, k+ 3.9 BUN 84, Cr 2.56 Mg+ 1.7 WBC 6.5, Hgb 11.6, Hct 36.6, plts 275  Currently he is DOE due to HR he tries to do activity and SOB increases.  No chest pain at all.     Past Medical History:  Diagnosis Date  . Alcohol abuse   . Colon cancer ([place])   . COPD (chronic obstructive pulmonary disease) ([place])   . Emphysema of lung ([place])   . Former tobacco use   . Hypertension   . Peripheral edema   . Sepsis ([place]) 10/2016   Aspiration PNA/C diff colitis    Past Surgical History:  Procedure Laterality Date  . CATARACT EXTRACTION    . COLON RESECTION    . COLONOSCOPY    . IMPLANTATION BONE ANCHORED HEARING AID Left 2016  . MINOR HEMORRHOIDECTOMY  1995  . TONSILLECTOMY       Home Medications:  Prior to Admission medications   Medication Sig Start Date End Date Taking? Authorizing Provider  acetaminophen (TYLENOL) 500 MG tablet Take 1,000 mg by mouth every 6 (six) hours as needed for fever.   Yes [provider]  allopurinol (ZYLOPRIM) 100 MG tablet Take 100 mg by mouth daily. 02/17/19  Yes [provider]  aspirin EC 81 MG tablet Take 81 mg by mouth daily.    Yes [provider]  bisoprolol (ZEBETA) 5 MG tablet Take 5 mg by mouth daily.   Yes [provider]  ferrous sulfate 325 (65 FE) MG tablet Take 325 mg by mouth daily.  Yes [provider]  furosemide (LASIX) 20 MG tablet Take 1 tablet (20 mg total) by mouth daily. 12/11/18 03/11/19 Yes David Harp, MD  HYDROcodone-homatropine (HYDROMET) 5-1.5 MG/5ML syrup Take 5 mLs by mouth every 6 (six) hours as needed for cough.   Yes [provider]  Multiple Vitamin (MULTIVITAMIN WITH MINERALS) TABS tablet Take 1 tablet by mouth daily.   Yes [provider]  oseltamivir (TAMIFLU) 75 MG capsule Take 75 mg by mouth 2 (two) times daily. 5 day course started 03/01/19 pm   Yes [provider]  predniSONE (DELTASONE) 10 MG tablet Take 10 mg by mouth daily.   Yes [provider]  tiotropium (SPIRIVA) 18 MCG inhalation capsule Place 1 capsule (18 mcg total) into inhaler and inhale daily. 05/10/13  Yes David Stage, MD    Inpatient  Medications: Scheduled Meds: . arformoterol  15 mcg Nebulization BID  . budesonide (PULMICORT) nebulizer solution  0.5 mg Nebulization BID  . diltiazem  300 mg Oral Daily  . docusate sodium  100 mg Oral BID  . feeding supplement (ENSURE ENLIVE)  237 mL Oral BID BM  . furosemide  40 mg Oral BID  . ipratropium  0.5 mg Nebulization BID  . levalbuterol  1.25 mg Nebulization BID  . mouth rinse  15 mL Mouth Rinse BID  . methylPREDNISolone (SOLU-MEDROL) injection  40 mg Intravenous Q8H  . metolazone  5 mg Oral Once  . rivaroxaban  15 mg Oral Q supper   Continuous Infusions:  PRN Meds: alum & mag hydroxide-simeth, hydrocortisone, hydrocortisone cream, lip balm, loratadine, Muscle Rub, phenol, polyethylene glycol, polyvinyl alcohol, senna-docusate, sodium chloride  Allergies:    Allergies  Allergen Reactions  . Flexeril [Cyclobenzaprine] Other (See Comments)    Unknown reaction     Social History:   Social History   Socioeconomic History  . Marital status: Married    Spouse name: Not on file  . Number of children: 3  . Years of education: Not on file  . Highest education level: Not on file  Occupational History  . Occupation: Retired- Associate Professor  Social Needs  . Financial resource strain: Not on file  . Food insecurity:    Worry: Not on file    Inability: Not on file  . Transportation needs:    Medical: Not on file    Non-medical: Not on file  Tobacco Use  . Smoking status: Former Smoker    Packs/day: 1.00    Years: 20.00    Pack years: 20.00    Types: Cigarettes    Last attempt to quit: 12/20/1981    Years since quitting: 37.2  . Smokeless tobacco: Never Used  Substance and Sexual Activity  . Alcohol use: Yes    Alcohol/week: 7.0 standard drinks    Types: 7 Standard drinks or equivalent per week    Comment: states he drinks 3-4 days a week, "a couple" on those occasions but does not further quaniity.  . Drug use: No  . Sexual activity: Never   Lifestyle  . Physical activity:    Days per week: Not on file    Minutes per session: Not on file  . Stress: Not on file  Relationships  . Social connections:    Talks on phone: Not on file    Gets together: Not on file    Attends religious service: Not on file    Active member of club or organization: Not on file    Attends  meetings of clubs or organizations: Not on file    Relationship status: Not on file  . Intimate partner violence:    Fear of current or ex partner: Not on file    Emotionally abused: Not on file    Physically abused: Not on file    Forced sexual activity: Not on file  Other Topics Concern  . Not on file  Social History Narrative  . Not on file    Family History:    Family History  Problem Relation Age of Onset  . Brain cancer Father   . Colon cancer Mother        with mets to liver  . Lung cancer Brother        was a smoker     ROS:  Please see the history of present illness.  General:no colds or fevers, no weight changes Skin:no rashes or ulcers HEENT:no blurred vision, no congestion CV:see HPI PUL:see HPI GI:no diarrhea constipation or melena, no indigestion GU:no hematuria, no dysuria MS:no joint pain, no claudication Neuro:no syncope, no lightheadedness Endo:no diabetes, no thyroid disease  All other ROS reviewed and negative.     Physical Exam/Data:   Vitals:   03/15/19 0830 03/15/19 0834 03/15/19 0835 03/15/19 0900  BP:      Pulse:    (!) 121  Resp:   20 20  Temp:    97.6 F (36.4 C)  TempSrc:    Oral  SpO2: (!) 88% (!) 88% (!) 88% (!) 87%  Weight:      Height:        Intake/Output Summary (Last 24 hours) at 03/15/2019 1009 Last data filed at 03/15/2019 0931 Gross per 24 hour  Intake 320 ml  Output 2350 ml  Net -2030 ml   Last 3 Weights 03/15/2019 03/12/2019 03/11/2019  Weight (lbs) 250 lb 14.4 oz 244 lb 7.8 oz 246 lb 0.5 oz  Weight (kg) 113.807 kg 110.9 kg 111.6 kg     Body mass index is 34.99 kg/m.  General:  Well  nourished, well developed, in no acute distress though increase HR with talking.  HEENT: normal Lymph: no adenopathy Neck: no JVD in sitting position Endocrine:  No thryomegaly Vascular: No carotid bruits; pedal pulses 1+ bilaterally   Cardiac:  irreg irreg; no murmur gallup rub or click Lungs:  diminshed to auscultation bilaterally, + wheezing, occ rhonchi no rales  Abd: soft, nontender, no hepatomegaly  Ext: 1+ to tr edema of lower ext - bil upper ext edema but improving  Musculoskeletal:  No deformities, BUE and BLE strength normal and equal Skin: warm and dry  Neuro:  Alert and oriented X 3 MAE follows commands, no focal abnormalities noted Psych:  Normal affect     Relevant CV Studies: Echo 03/07/19 IMPRESSIONS   1. The left ventricle has normal systolic function, with an ejection fraction of 55-60%. The cavity size was normal. Left ventricular diastolic function could not be evaluated secondary to atrial fibrillation.  2. The right ventricle has normal systolic function. The cavity was normal. There is no increase in right ventricular wall thickness.  3. Right atrial size was mildly dilated.  4. The mitral valve is grossly normal. Mild thickening of the mitral valve leaflet.  5. The aortic valve is tricuspid Mild thickening of the aortic valve Mild calcification of the aortic valve. no stenosis of the aortic valve.  6. Incomplete echo.  7. Technically difficult images.       8. The interatrial septum was  not assessed.  FINDINGS  Left Ventricle: The left ventricle has normal systolic function, with an ejection fraction of 55-60%. The cavity size was normal. There is no increase in left ventricular wall thickness. Left ventricular diastolic function could not be evaluated  secondary to atrial fibrillation. Right Ventricle: The right ventricle has normal systolic function. The cavity was normal. There is no increase in right ventricular wall thickness. Left Atrium: left atrial  size was normal in size Right Atrium: right atrial size was mildly dilated. Right atrial pressure is estimated at 10 mmHg. Interatrial Septum: The interatrial septum was not assessed. Pericardium: There is no evidence of pericardial effusion. Mitral Valve: The mitral valve is grossly normal. Mild thickening of the mitral valve leaflet. Mitral valve regurgitation is trivial by color flow Doppler. Tricuspid Valve: The tricuspid valve is normal in structure. Tricuspid valve regurgitation was not visualized by color flow Doppler. Aortic Valve: The aortic valve is tricuspid Mild thickening of the aortic valve Mild calcification of the aortic valve. Aortic valve regurgitation was not visualized by color flow Doppler. There is no stenosis of the aortic valve. Pulmonic Valve: The pulmonic valve was grossly normal. Pulmonic valve regurgitation is not visualized by color flow Doppler.    Laboratory Data:  Chemistry Recent Labs  Lab 03/13/19 0422 03/14/19 0405 03/15/19 0359  NA 142 140 141  K 3.8 3.6 3.9  CL 103 103 99  CO2 27 29 30   GLUCOSE 180* 190* 193*  BUN 74* 79* 84*  CREATININE 2.46* 2.40* 2.56*  CALCIUM 8.6* 8.1* 8.4*  GFRNONAA 23* 24* 22*  GFRAA 27* 28* 26*  ANIONGAP 12 8 12     No results for input(s): PROT, ALBUMIN, AST, ALT, ALKPHOS, BILITOT in the last 168 hours. Hematology Recent Labs  Lab 03/13/19 0422 03/14/19 0405 03/15/19 0359  WBC 5.9 6.4 6.5  RBC 3.43* 3.74* 3.69*  HGB 10.7* 11.7* 11.6*  HCT 34.4* 37.6* 36.6*  MCV 100.3* 100.5* 99.2  MCH 31.2 31.3 31.4  MCHC 31.1 31.1 31.7  RDW 15.8* 15.7* 15.6*  PLT 266 283 275   Cardiac EnzymesNo results for input(s): TROPONINI in the last 168 hours. No results for input(s): TROPIPOC in the last 168 hours.  BNP Recent Labs  Lab 03/09/19 0359 03/11/19 0438 03/14/19 0405  BNP 546.9* 640.1* 457.1*    DDimer No results for input(s): DDIMER in the last 168 hours.  Radiology/Studies:  No results found.  Assessment and  Plan:   1. Atrial fib new from Jan 2020 now on po dilt at 300 mg and Xarelto 15 mg. Cr. Clearance 51mL/min --Xarelto was started 03/07/19.   Rate 120s with talking or minimal activity  Difficult to use BB with lung issues.  EF is normal.  CHA2DS2VASc 4 --we could add dig- but cardioversion would be beneficial - ? Give more time for lungs to heal.  Dr. Stanford Breed to see. 2. Diastolic HF on lasix 40 mg po BID this is up from 20 mg daily at home. To receive zaroxolyn today.  Also with bil pl effusion  3. Acute hypoxic respiratory failure with PNA per IM and pulmonary.  On 6 L [place]. Completed ABX. On IV solumedrol.  4. COPD per IM 5. HTN BP 6. AKI with Cr 2.56 baseline about 1.9 in jan 2020 7. DNR      For questions or updates, please contact [place] Please consult [email or web address] for contact info under     Signed, Cecilie Kicks, NP  03/15/2019 10:09 AM As above, patient  seen and examined.  Briefly he is an 85 year old male with past medical history of diastolic congestive heart failure, supraventricular tachycardia, hypertension, colon cancer, COPD admitted with pneumonia who we are asked to evaluate for atrial fibrillation and acute on chronic diastolic congestive heart failure.  Patient admitted March 15 with pneumonia requiring intubation.  He has slowly improved.  He developed atrial fibrillation with rapid ventricular response and has been treated with Cardizem.  He has developed worsening edema requiring diuresis.  Cardiology asked to evaluate.  He denies dyspnea at present and there is no chest pain, palpitations or syncope. BUN 84 and creatinine 2.56 Echocardiogram shows normal LV function. Electrocardiogram shows atrial fibrillation with PVCs or aberrantly conducted beats, rate elevated, nonspecific ST changes.  1 new onset atrial fibrillation-patient remains in atrial fibrillation today and rate is mildly elevated.  Increase Cardizem CD to 360 mg daily. CHADSvasc 4.  Continue Xarelto at  15 mg daily given creatinine clearance less than 50.  I think his atrial fibrillation is contributing to his diastolic congestive heart failure.  His renal function is worsening with diuresis.  We will therefore plan to proceed with TEE guided cardioversion tomorrow.  2 acute on chronic diastolic congestive heart failure-patient is volume overloaded on examination.  Hopefully reestablishing sinus rhythm will help CHF symptoms as outlined.  His creatinine is increasing.  Would hold further diuresis for now.  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  Will hold on further diuresis for now.  4 status post pneumonia-Per primary care.  Kirk Ruths, MD

## 2019-03-15 NOTE — Progress Notes (Signed)
PROGRESS NOTE    David Barton  CVE:[id number] DOB: Aug 08, 1933 DOA: 03/03/2019 PCP: Maury Dus, MD   Brief Narrative:  85 year old with history of COPD/emphysema, PSVT, essential hypertension, systolic CHF with ejection fraction 30-35%, alcohol abuse, colon cancer admitted from [facility] due to acute hypoxic respiratory failure and septic shock with acute kidney injury.  Initially requiring intubation and pressors.  He was started on broad-spectrum antibiotics.  Eventually extubated on 03/06/2019.  Influenza swab was negative.  But prior to that had been receiving Tamiflu.  There was a high suspicion for COVID19 which was ruled out.  Patient has been making slow improvement over the course the last 4 days.  He has made significant improvement in terms of weaning down his oxygen to 6 L high flow.     Assessment & Plan:   Active Problems:   Acute respiratory failure ([name])   COPD with acute exacerbation ([name])   Essential hypertension   Community acquired pneumonia of right lung   Acute systolic CHF (congestive heart failure) ([name])   Pulmonary edema   Edema of both legs   Atrial fibrillation with RVR ([name])  Acute hypoxic respiratory failure secondary to community-acquired pneumonia, streptococcal; still on 8+LHF [place] Abnormal breath sounds - Slow to improve. ProCal- 2.87. BNP slightly elevated.  Completed course of azithromycin, IV Rocephin.  Supplemental oxygen as needed, wean off oxygen. -Aggressive bronchodilator treatment, Pulmicort, IS/Flutter. Cont Xopenex -Repeat chest x-ray today. Maybe get CT chest if needed.  - Cont IV Solumedrol -Spoke with pulmonary today, Dr Alycia Patten was advising to continue current treatment as patient is on maximum treatment as possible at the time.  Acute on chronic pressure congestive heart failure, ejection fraction 55%, class III Bilateral pleural effusion with infiltrate -Continue Lasix 40mg  PO BID.  Strictly monitor input and output.  1 dose of  Zaroxolyn  Atrial fibrillation with mild RVR, persistent - Increase Cardizem to 300 mg daily.  Continue Xarelto.  Will consult cardiology for their input  Acute mild to moderate COPD exacerbation -Continue aggressive bronchodilators.  Cont Solumedrol.  Acute kidney injury likely secondary to ATN -Creatinine has increased to 2.5.  Baseline is 1.9 it seems like..  Foley removed, now has condom catheter.   Bilateral upper extremity swelling -Suspect dependent edema.  Advised to raise his arm and put pillows underneath it.  Generalized weakness -Physical therapy recommended CIR.  Will evaluate him day to day. Once his O2 requirement is low- will send him to CIR.   DVT prophylaxis: Xarelto Code Status: DNR Family Communication: None at bedside Disposition Plan: Patient is still requiring significant amount of high flow oxygen 8-10 L.  Maintain hospital stay until this is improved.  Antimicrobials:   Rocephin  Subjective: Feels ok, but still gets short of breath with exertion. Yesterday had an episode of slightly dfizziness when ambulating.   Review of Systems Otherwise negative except as per HPI, including: General = no fevers, chills, dizziness, malaise, fatigue HEENT/EYES = negative for pain, redness, loss of vision, double vision, blurred vision, loss of hearing, sore throat, hoarseness, dysphagia Cardiovascular= negative for chest pain, palpitation, murmurs, lower extremity swelling Respiratory/lungs= negative for  hemoptysis, wheezing, mucus production Gastrointestinal= negative for nausea, vomiting,, abdominal pain, melena, hematemesis Genitourinary= negative for Dysuria, Hematuria, Change in Urinary Frequency MSK = Negative for arthralgia, myalgias, Back Pain, Joint swelling  Neurology= Negative for headache, seizures, numbness, tingling  Psychiatry= Negative for anxiety, depression, suicidal and homocidal ideation Allergy/Immunology= Medication/Food allergy as listed   Skin= Negative  for Rash, lesions, ulcers, itching  Objective: Vitals:   03/15/19 0830 03/15/19 0834 03/15/19 0835 03/15/19 0900  BP:      Pulse:    (!) 121  Resp:   20 20  Temp:    97.6 F (36.4 C)  TempSrc:    Oral  SpO2: (!) 88% (!) 88% (!) 88% (!) 87%  Weight:      Height:        Intake/Output Summary (Last 24 hours) at 03/15/2019 0919 Last data filed at 03/15/2019 0700 Gross per 24 hour  Intake 360 ml  Output 2350 ml  Net -1990 ml   Filed Weights   03/11/19 0500 03/12/19 0500 03/15/19 0618  Weight: 111.6 kg 110.9 kg 113.8 kg    Examination: Constitutional: NAD, calm, comfortable; 8L HF [place] Eyes: PERRL, lids and conjunctivae normal ENMT: Mucous membranes are moist. Posterior pharynx clear of any exudate or lesions.Normal dentition.  Neck: normal, supple, no masses, no thyromegaly Respiratory: diffuse exp wheezing.  Cardiovascular: Tachycardia with irregular heartbeat-heart rate fluctuating from 110-140, no murmurs / rubs / gallops. No extremity edema. 2+ pedal pulses. No carotid bruits.  Abdomen: no tenderness, no masses palpated. No hepatosplenomegaly. Bowel sounds positive.  Musculoskeletal: no clubbing / cyanosis. No joint deformity upper and lower extremities. Good ROM, no contractures. Normal muscle tone.  Skin: no rashes, lesions, ulcers. No induration Neurologic: CN 2-12 grossly intact. Sensation intact, DTR normal. Strength 4/5 in all 4.  Psychiatric: Normal judgment and insight. Alert and oriented x 3. Normal mood.   Data Reviewed:   CBC: Recent Labs  Lab 03/11/19 0438 03/12/19 0547 03/13/19 0422 03/14/19 0405 03/15/19 0359  WBC 8.5 7.3 5.9 6.4 6.5  HGB 11.5* 11.5* 10.7* 11.7* 11.6*  HCT 36.9* 36.0* 34.4* 37.6* 36.6*  MCV 103.9* 101.4* 100.3* 100.5* 99.2  PLT 302 276 266 283 160   Basic Metabolic Panel: Recent Labs  Lab 03/11/19 0438 03/12/19 0547 03/13/19 0422 03/14/19 0405 03/15/19 0359  NA 142 140 142 140 141  K 3.8 3.5 3.8 3.6 3.9  CL  107 105 103 103 99  CO2 26 24 27 29 30   GLUCOSE 174* 176* 180* 190* 193*  BUN 59* 68* 74* 79* 84*  CREATININE 2.29* 2.47* 2.46* 2.40* 2.56*  CALCIUM 9.0 8.6* 8.6* 8.1* 8.4*  MG 1.7 1.5* 1.9 1.7 1.7   GFR: Estimated Creatinine Clearance: 28 mL/min (A) (by C-G formula based on SCr of 2.56 mg/dL (H)). Liver Function Tests: No results for input(s): AST, ALT, ALKPHOS, BILITOT, PROT, ALBUMIN in the last 168 hours. No results for input(s): LIPASE, AMYLASE in the last 168 hours. No results for input(s): AMMONIA in the last 168 hours. Coagulation Profile: No results for input(s): INR, PROTIME in the last 168 hours. Cardiac Enzymes: No results for input(s): CKTOTAL, CKMB, CKMBINDEX, TROPONINI in the last 168 hours. BNP (last 3 results) No results for input(s): PROBNP in the last 8760 hours. HbA1C: No results for input(s): HGBA1C in the last 72 hours. CBG: No results for input(s): GLUCAP in the last 168 hours. Lipid Profile: No results for input(s): CHOL, HDL, LDLCALC, TRIG, CHOLHDL, LDLDIRECT in the last 72 hours. Thyroid Function Tests: No results for input(s): TSH, T4TOTAL, FREET4, T3FREE, THYROIDAB in the last 72 hours. Anemia Panel: No results for input(s): VITAMINB12, FOLATE, FERRITIN, TIBC, IRON, RETICCTPCT in the last 72 hours. Sepsis Labs: Recent Labs  Lab 03/09/19 0359  PROCALCITON 2.87    No results found for this or any previous visit (from the past  240 hour(s)).       Radiology Studies: No results found.      Scheduled Meds: . arformoterol  15 mcg Nebulization BID  . budesonide (PULMICORT) nebulizer solution  0.5 mg Nebulization BID  . diltiazem  300 mg Oral Daily  . docusate sodium  100 mg Oral BID  . feeding supplement (ENSURE ENLIVE)  237 mL Oral BID BM  . furosemide  40 mg Oral BID  . ipratropium  0.5 mg Nebulization BID  . levalbuterol  1.25 mg Nebulization BID  . mouth rinse  15 mL Mouth Rinse BID  . methylPREDNISolone (SOLU-MEDROL) injection  40 mg  Intravenous Q8H  . metolazone  5 mg Oral Once  . rivaroxaban  15 mg Oral Q supper   Continuous Infusions:   LOS: 12 days   Time spent= 35 mins    Jeilani Grupe Arsenio Loader, MD [facility]  If 7PM-7AM, please contact night-coverage [email or web address] 03/15/2019, 9:19 AM

## 2019-03-15 NOTE — Progress Notes (Signed)
Occupational Therapy Treatment Patient Details Name: David Barton MRN: [id number] DOB: 1933/10/02 Today's Date: 03/15/2019    History of present illness 85 year old man prior tobacco, psvt, systolic hf ef 82-95% with normalization to 50's osa not on therapy, with a 4-day history of increasing shortness of breath with productive cough.  Flu swab negative at his physician's office on 3/12.  On Tamiflu empirically.  Developed fever and rigors 3/13 and was increasingly short of breath and altered this morning prompting his wife to call EMS. Prior history of pneumonia on background of COPD from remote smoking. No recent travel, no sick contacts.   OT comments  Pt advised by MD to hold therapy as pt in Afib at rest with intermittent verifiable V-tach and pt has been asked not to exert too much energy, rather rest. Pt seated in recliner awaiting lunch. OTR printed out energy conservation techniques to increase independence for therapy later on. Pt as able to mention 2 energy conservation strategies before reading handout. Pt expressed frustration of not getting better and feeling asymptomatic. Pt SpO2 desatting to 87% on  6LO2 [place]. Pt's HR at rest with talking only 110-135 BPM. Pt would greatly benefit from continued OT skilled services for ADL, mobility and safety in CIR setting. OT to follow acutely.      Follow Up Recommendations  CIR    Equipment Recommendations  None recommended by OT    Recommendations for Other Services      Precautions / Restrictions Precautions Precautions: Fall Precaution Comments: watch O2 Sats and HR  Restrictions Weight Bearing Restrictions: No       Mobility Bed Mobility               General bed mobility comments: OOB in chair   Transfers                 General transfer comment: deferred     Balance Overall balance assessment: Needs assistance Sitting-balance support: Feet supported Sitting balance-Leahy Scale: Good                                     ADL either performed or assessed with clinical judgement   ADL                                       Functional mobility during ADLs: (deferred today) General ADL Comments: Pt satting 87%-92% on 6L [place]. Pt as advised by MD not to move today or exert energy as needed. Pt given energy conservation techniques for ADL/IADL for later therapy sessions.     Vision       Perception     Praxis      Cognition Arousal/Alertness: Awake/alert Behavior During Therapy: WFL for tasks assessed/performed Overall Cognitive Status: Within Functional Limits for tasks assessed                                          Exercises     Shoulder Instructions       General Comments      Pertinent Vitals/ Pain       Pain Assessment: No/denies pain  Home Living  Prior Functioning/Environment              Frequency  Min 3X/week        Progress Toward Goals  OT Goals(current goals can now be found in the care plan section)  Progress towards OT goals: Progressing toward goals  Acute Rehab OT Goals Patient Stated Goal: get stronger OT Goal Formulation: With patient Time For Goal Achievement: 03/29/19 Potential to Achieve Goals: Good ADL Goals Pt Will Perform Lower Body Bathing: with modified independence;sit to/from stand Pt Will Transfer to Toilet: with modified independence;ambulating Pt Will Perform Tub/Shower Transfer: with modified independence;ambulating Additional ADL Goal #1: Pt will independently verbalize 3 energy conservation strategies Additional ADL Goal #2: Pt will independently verbalize 3 strategies to prevent falls  Plan Discharge plan remains appropriate    Co-evaluation                 AM-PAC OT "6 Clicks" Daily Activity     Outcome Measure   Help from another person eating meals?: None Help from another person taking care  of personal grooming?: None Help from another person toileting, which includes using toliet, bedpan, or urinal?: A Little Help from another person bathing (including washing, rinsing, drying)?: A Little Help from another person to put on and taking off regular upper body clothing?: A Little Help from another person to put on and taking off regular lower body clothing?: A Little 6 Click Score: 20    End of Session Equipment Utilized During Treatment: Oxygen  OT Visit Diagnosis: Unsteadiness on feet (R26.81);Repeated falls (R29.6)   Activity Tolerance Patient tolerated treatment well   Patient Left in chair;with call bell/phone within reach   Nurse Communication Mobility status        Time: 1245-1300 OT Time Calculation (min): 15 min  Charges: OT General Charges $OT Visit: 1 Visit OT Treatments $Therapeutic Activity: 8-22 mins  Ebony Hail Harold Hedge) Marsa Aris OTR/L Acute Rehabilitation Services Pager: [phone] Office: [phone]    Fredda Hammed 03/15/2019, 2:22 PM

## 2019-03-15 NOTE — Progress Notes (Addendum)
RD working remotely  Nutrition Follow Up  DOCUMENTATION CODES:   Obesity unspecified  INTERVENTION:    Ensure Enlive po BID, each supplement provides 350 kcal and 20 grams of protein  NUTRITION DIAGNOSIS:   Inadequate oral intake related to poor appetite as evidenced by per patient/family report, resolved  GOAL:   Patient will meet greater than or equal to 90% of their needs, met  MONITOR:   PO intake, Supplement acceptance, Labs, Weight trends  ASSESSMENT:   85 yo male admitted with acute respiratory failure related to CAP requiring brief intubation, septic shock, acute encephalopathy, ARF. PMH includes COPD, HTN, EtOH abuse, colon cancer, former tobacco abuse  3/15 extubated, TF discontinued via OGT 3/16 advanced to Dys 1-thin liquid diet 3/18 advanced to Soft diet 3/19 diet liberalized to Regular  Pt transferred out of 87M-MICU 3/19. She reports she has a good appetite. PO intake 100% per flowsheet records.  Cardiology consulted for atrial-fib evaluation. Drinking some of her Ensure Enlive supplements. Labs & medications reviewed.  PT recommending [facility].  Diet Order:  Regular  EDUCATION NEEDS:   Not appropriate for education at this time  Skin:  Skin Assessment: Reviewed RN Assessment  Last BM:  3/25  Height:   Ht Readings from Last 1 Encounters:  03/03/19 _0  (1.803 m)    Weight:   Wt Readings from Last 1 Encounters:  03/15/19 113.8 kg    Ideal Body Weight:  78.2 kg  BMI:  Body mass index is 34.99 kg/m.  Estimated Nutritional Needs:   Kcal:  1850-2050  Protein:  95-110 gm  Fluid:  1.8-2.0 L  Arthur Holms, RD, LDN Pager #: [phone] After-Hours Pager #: [phone]

## 2019-03-16 ENCOUNTER — Encounter (HOSPITAL_COMMUNITY)
Admission: EM | Disposition: A | Discharge status: Home / Self Care | Payer: Self-pay | Source: Home / Self Care | Attending: Internal Medicine

## 2019-03-16 ENCOUNTER — Inpatient Hospital Stay (HOSPITAL_COMMUNITY): Payer: Medicare Other

## 2019-03-16 ENCOUNTER — Inpatient Hospital Stay (HOSPITAL_COMMUNITY): Payer: Medicare Other | Admitting: Certified Registered Nurse Anesthetist

## 2019-03-16 ENCOUNTER — Encounter (HOSPITAL_COMMUNITY): Payer: Self-pay | Admitting: Certified Registered Nurse Anesthetist

## 2019-03-16 DIAGNOSIS — I4891 Unspecified atrial fibrillation: Secondary | ICD-10-CM

## 2019-03-16 HISTORY — PX: CARDIOVERSION: SHX1299

## 2019-03-16 HISTORY — PX: TEE WITHOUT CARDIOVERSION: SHX5443

## 2019-03-16 LAB — BASIC METABOLIC PANEL
Anion gap: 11 (ref 5–15)
Anion gap: 16 — ABNORMAL HIGH (ref 5–15)
BUN: 91 mg/dL — ABNORMAL HIGH (ref 8–23)
BUN: 89 mg/dL — ABNORMAL HIGH (ref 8–23)
CALCIUM: 8.3 mg/dL — AB (ref 8.9–10.3)
CALCIUM: 8.6 mg/dL — AB (ref 8.9–10.3)
CO2: 28 mmol/L (ref 22–32)
CO2: 29 mmol/L (ref 22–32)
Chloride: 102 mmol/L (ref 98–111)
Chloride: 97 mmol/L — ABNORMAL LOW (ref 98–111)
Creatinine, Ser: 2.64 mg/dL — ABNORMAL HIGH (ref 0.61–1.24)
Creatinine, Ser: 2.5 mg/dL — ABNORMAL HIGH (ref 0.61–1.24)
GFR calc Af Amer: 25 mL/min — ABNORMAL LOW (ref >60)
GFR calc Af Amer: 27 mL/min — ABNORMAL LOW (ref >60)
GFR calc non Af Amer: 21 mL/min — ABNORMAL LOW (ref >60)
GFR calc non Af Amer: 23 mL/min — ABNORMAL LOW (ref >60)
Glucose, Bld: 202 mg/dL — ABNORMAL HIGH (ref 70–99)
Glucose, Bld: 176 mg/dL — ABNORMAL HIGH (ref 70–99)
Potassium: 3.2 mmol/L — ABNORMAL LOW (ref 3.5–5.1)
Potassium: 3.2 mmol/L — ABNORMAL LOW (ref 3.5–5.1)
Sodium: 141 mmol/L (ref 135–145)
Sodium: 142 mmol/L (ref 135–145)

## 2019-03-16 LAB — CBC
HCT: 34.9 % — ABNORMAL LOW (ref 39.0–52.0)
Hemoglobin: 11.4 g/dL — ABNORMAL LOW (ref 13.0–17.0)
MCH: 31.8 pg (ref 26.0–34.0)
MCHC: 32.7 g/dL (ref 30.0–36.0)
MCV: 97.5 fL (ref 80.0–100.0)
PLATELETS: 262 10*3/uL (ref 150–400)
RBC: 3.58 MIL/uL — ABNORMAL LOW (ref 4.22–5.81)
RDW: 15.7 % — AB (ref 11.5–15.5)
WBC: 7.2 10*3/uL (ref 4.0–10.5)
nRBC: 0 % (ref 0.0–0.2)

## 2019-03-16 LAB — PROTIME-INR
INR: 2.3 — ABNORMAL HIGH (ref 0.8–1.2)
Prothrombin Time: 24.9 seconds — ABNORMAL HIGH (ref 11.4–15.2)

## 2019-03-16 LAB — MAGNESIUM: Magnesium: 1.7 mg/dL (ref 1.7–2.4)

## 2019-03-16 SURGERY — ECHOCARDIOGRAM, TRANSESOPHAGEAL
Anesthesia: Monitor Anesthesia Care

## 2019-03-16 MED ORDER — POTASSIUM CHLORIDE CRYS ER 20 MEQ PO TBCR
40.0000 meq | EXTENDED_RELEASE_TABLET | Freq: Two times a day (BID) | ORAL | Status: AC
  Administered 2019-03-16 (×2): 40 meq via ORAL
  Filled 2019-03-16 (×2): qty 2

## 2019-03-16 MED ORDER — PROPOFOL 500 MG/50ML IV EMUL
INTRAVENOUS | Status: DC | PRN
  Administered 2019-03-16: 100 ug/kg/min via INTRAVENOUS

## 2019-03-16 MED ORDER — IPRATROPIUM-ALBUTEROL 0.5-2.5 (3) MG/3ML IN SOLN
RESPIRATORY_TRACT | Status: AC
  Filled 2019-03-16: qty 3

## 2019-03-16 MED ORDER — LEVALBUTEROL HCL 0.63 MG/3ML IN NEBU
INHALATION_SOLUTION | RESPIRATORY_TRACT | Status: AC
  Filled 2019-03-16: qty 3

## 2019-03-16 MED ORDER — SODIUM CHLORIDE 0.9 % IV SOLN
INTRAVENOUS | Status: DC
  Administered 2019-03-16: 09:00:00 via INTRAVENOUS

## 2019-03-16 MED ORDER — LACTATED RINGERS IV SOLN
INTRAVENOUS | Status: DC | PRN
  Administered 2019-03-16: 09:00:00 via INTRAVENOUS

## 2019-03-16 MED ORDER — MAGNESIUM SULFATE 2 GM/50ML IV SOLN
2.0000 g | Freq: Once | INTRAVENOUS | Status: AC
  Administered 2019-03-16: 2 g via INTRAVENOUS
  Filled 2019-03-16: qty 50

## 2019-03-16 MED ORDER — LIDOCAINE 2% (20 MG/ML) 5 ML SYRINGE
INTRAMUSCULAR | Status: DC | PRN
  Administered 2019-03-16: 40 mg via INTRAVENOUS

## 2019-03-16 NOTE — Progress Notes (Signed)
Progress Note  Patient Name: David Barton Date of Encounter: 03/16/2019  Primary Cardiologist: Quay Burow, MD   Subjective   No CP or dyspnea  Inpatient Medications    Scheduled Meds: . arformoterol  15 mcg Nebulization BID  . budesonide (PULMICORT) nebulizer solution  0.5 mg Nebulization BID  . diltiazem  360 mg Oral Daily  . docusate sodium  100 mg Oral BID  . feeding supplement (ENSURE ENLIVE)  237 mL Oral BID BM  . ipratropium  0.5 mg Nebulization BID  . levalbuterol  1.25 mg Nebulization BID  . mouth rinse  15 mL Mouth Rinse BID  . methylPREDNISolone (SOLU-MEDROL) injection  40 mg Intravenous Q8H  . potassium chloride  40 mEq Oral BID  . rivaroxaban  15 mg Oral Q supper  . sodium chloride flush  3 mL Intravenous Q12H   Continuous Infusions: . sodium chloride    . magnesium sulfate 1 - 4 g bolus IVPB     PRN Meds: alum & mag hydroxide-simeth, hydrocortisone, hydrocortisone cream, lip balm, loratadine, Muscle Rub, phenol, polyethylene glycol, polyvinyl alcohol, senna-docusate, sodium chloride, sodium chloride flush   Vital Signs    Vitals:   03/16/19 0816 03/16/19 0956 03/16/19 1006 03/16/19 1016  BP: (!) 158/94  126/68 (!) 141/55  Pulse: (!) 116  88 74  Resp: 19  19 20   Temp: 98 F (36.7 C) 97.9 F (36.6 C)    TempSrc: Oral Oral    SpO2: 92% (!) 86% 91% 90%  Weight:      Height:        Intake/Output Summary (Last 24 hours) at 03/16/2019 1033 Last data filed at 03/16/2019 2992 Gross per 24 hour  Intake 826 ml  Output 650 ml  Net 176 ml   Last 3 Weights 03/15/2019 03/12/2019 03/11/2019  Weight (lbs) 250 lb 14.4 oz 244 lb 7.8 oz 246 lb 0.5 oz  Weight (kg) 113.807 kg 110.9 kg 111.6 kg      Telemetry    Atrial fibrillation not s/p DCCV to sinus - Personally Reviewed   Physical Exam   GEN: No acute distress.   Neck: supple Cardiac: RRR Respiratory: Clear to auscultation bilaterally. GI: Soft, nontender, non-distended  MS: 1+ edema Neuro:   Nonfocal  Psych: Normal affect   Labs    Chemistry Recent Labs  Lab 03/15/19 0359 03/16/19 0237 03/16/19 0757  NA 141 141 142  K 3.9 3.2* 3.2*  CL 99 102 97*  CO2 30 28 29   GLUCOSE 193* 202* 176*  BUN 84* 91* 89*  CREATININE 2.56* 2.64* 2.50*  CALCIUM 8.4* 8.3* 8.6*  GFRNONAA 22* 21* 23*  GFRAA 26* 25* 27*  ANIONGAP 12 11 16*     Hematology Recent Labs  Lab 03/14/19 0405 03/15/19 0359 03/16/19 0237  WBC 6.4 6.5 7.2  RBC 3.74* 3.69* 3.58*  HGB 11.7* 11.6* 11.4*  HCT 37.6* 36.6* 34.9*  MCV 100.5* 99.2 97.5  MCH 31.3 31.4 31.8  MCHC 31.1 31.7 32.7  RDW 15.7* 15.6* 15.7*  PLT 283 275 262    BNP Recent Labs  Lab 03/11/19 0438 03/14/19 0405  BNP 640.1* 457.1*     Radiology    Dg Chest Port 1 View  Result Date: 03/15/2019 CLINICAL DATA:  Tachycardia. Hypertension. History of COPD and hypertension. EXAM: PORTABLE CHEST 1 VIEW COMPARISON:  03/09/2019; 03/05/2019; 03/03/2019 FINDINGS: Grossly unchanged cardiac silhouette and mediastinal contours atherosclerotic plaque within thoracic aorta. The pulmonary vasculature remains indistinct with cephalization. Unchanged small layering  effusions and associated bibasilar heterogeneous/consolidative opacities, left greater right. No new focal airspace opacities. No pneumothorax. No acute osseous abnormalities. IMPRESSION: Similar findings of cardiomegaly, pulmonary edema, small bilateral effusions and associated bibasilar opacities, atelectasis versus infiltrate. Electronically Signed   By: Sandi Mariscal M.D.   On: 03/15/2019 11:03    Patient Profile     85 year old male with past medical history of diastolic congestive heart failure, supraventricular tachycardia, hypertension, colon cancer, COPD admitted with pneumonia who we are asked to evaluate for atrial fibrillation and acute on chronic diastolic congestive heart failure.  Patient admitted March 15 with pneumonia requiring intubation.  He has slowly improved.  He developed  atrial fibrillation with rapid ventricular response and has been treated with Cardizem.  He developed worsening edema requiring diuresis and subsequently progressive renal insuff.  Cardiology asked to evaluate.   Echocardiogram shows normal LV function.   Assessment & Plan    1 new onset atrial fibrillation-patient is now status post TEE guided cardioversion.  Hopefully this will improve volume excess/diastolic congestive heart failure.  We will continue Cardizem at 360 mg daily for rate control if atrial fibrillation recurs.  Continue Xarelto.    2 acute on chronic diastolic congestive heart failure-volume status somewhat improved.  Hopefully reestablishing sinus rhythm will improve CHF.  Given elevated BUN and creatinine will hold on further diuresis for now.  3 acute on chronic stage III kidney disease-renal function unchanged compared to yesterday.  We will continue to hold diuretics for now. Follow BUN and Cr.  4 status post pneumonia-Per primary care.  5 hypokalemia-supplement.  For questions or updates, please contact [place] Please consult [email or web address] for contact info under        Signed, Kirk Ruths, MD  03/16/2019, 10:33 AM

## 2019-03-16 NOTE — H&P (Signed)
    INTERVAL PROCEDURE H&P  History and Physical Interval Note:  03/16/2019 9:03 AM  David Barton has presented today for their planned procedure. The various methods of treatment have been discussed with the patient and family. After consideration of risks, benefits and other options for treatment, the patient has consented to the procedure.  The patients' outpatient history has been reviewed, patient examined, and no change in status from most recent office note within the past 30 days. I have reviewed the patients' chart and labs and will proceed as planned. Questions were answered to the patient's satisfaction.   Pixie Casino, MD, [facility], [place] Director of the [facility]  Cardiovascular [facility] Diplomate of the American Board of Clinical Lipidology Attending Cardiologist  Direct Dial: [phone]  Fax: [phone]  Website:  [email or web address] [email or web address] Ineze Serrao 03/16/2019, 9:03 AM

## 2019-03-16 NOTE — Anesthesia Preprocedure Evaluation (Addendum)
Anesthesia Evaluation  Patient identified by MRN, date of birth, ID band Patient awake    Reviewed: Allergy & Precautions, H&P , NPO status , Patient's Chart, lab work & pertinent test results  Airway Mallampati: II   Neck ROM: full    Dental   Pulmonary sleep apnea , pneumonia, unresolved, COPD, former smoker,  Acute on chronic respiratory failure.  O2 Sat 91% on 6L [place]    + decreased breath sounds      Cardiovascular hypertension, +CHF   Rhythm:irregular Rate:Normal     Neuro/Psych    GI/Hepatic   Endo/Other    Renal/GU Renal InsufficiencyRenal disease     Musculoskeletal   Abdominal   Peds  Hematology   Anesthesia Other Findings   Reproductive/Obstetrics                             Anesthesia Physical Anesthesia Plan  ASA: IV  Anesthesia Plan: MAC   Post-op Pain Management:    Induction: Intravenous  PONV Risk Score and Plan: 1 and Ondansetron, Treatment may vary due to age or medical condition and Propofol infusion  Airway Management Planned: Nasal Cannula  Additional Equipment:   Intra-op Plan:   Post-operative Plan:   Informed Consent: I have reviewed the patients History and Physical, chart, labs and discussed the procedure including the risks, benefits and alternatives for the proposed anesthesia with the patient or authorized representative who has indicated his/her understanding and acceptance.   Patient has DNR.     Plan Discussed with: CRNA, Anesthesiologist and Surgeon  Anesthesia Plan Comments:        Anesthesia Quick Evaluation

## 2019-03-16 NOTE — Progress Notes (Signed)
  Echocardiogram 2D Echocardiogram has been performed.  David Barton 03/16/2019, 10:18 AM

## 2019-03-16 NOTE — Progress Notes (Signed)
PROGRESS NOTE    David Barton  AOZ:[id number] DOB: 1933/09/19 DOA: 03/03/2019 PCP: Maury Dus, MD   Brief Narrative:  85 year old with history of COPD/emphysema, PSVT, essential hypertension, systolic CHF with ejection fraction 30-35%, alcohol abuse, colon cancer admitted from [name] due to acute hypoxic respiratory failure and septic shock with acute kidney injury.  Initially requiring intubation and pressors.  He was started on broad-spectrum antibiotics.  Eventually extubated on 03/06/2019.  Influenza swab was negative.  But prior to that had been receiving Tamiflu.  There was a high suspicion for COVID19 which was ruled out.  Patient has been making slow improvement over the course the last 4 days.  He has made significant improvement in terms of weaning down his oxygen to 6 L high flow.  Cardiology recommended cardioversion for better rate control.   Assessment & Plan:   Active Problems:   Acute respiratory failure ([name])   COPD with acute exacerbation ([name])   Essential hypertension   Community acquired pneumonia of right lung   Acute systolic CHF (congestive heart failure) ([name])   Pulmonary edema   Edema of both legs   Atrial fibrillation with RVR ([name])  Acute hypoxic respiratory failure secondary to community-acquired pneumonia, streptococcal; still on 8+LHF [place] Abnormal breath sounds - Slow to improve. ProCal- 2.87. BNP slightly elevated.  Completed course of azithromycin, IV Rocephin.  Supplemental oxygen as needed, wean off oxygen. -Aggressive bronchodilator treatment, Pulmicort, IS/Flutter. Cont Xopenex -Repeat chest x-ray today. Maybe get CT chest if needed.  - Cont IV Solumedrol today.  -Spoke with pulmonary 3/26, Dr Alycia Barton was advising to continue current treatment as patient is on maximum treatment as possible at the time.  Acute on chronic pressure congestive heart failure, ejection fraction 55%, class III Bilateral pleural effusion with infiltrate -Continue  Lasix 40mg  PO BID.  I/Os  Atrial fibrillation with mild RVR, persistent - Increase Cardizem to 300 mg daily.  Continue Xarelto.  Cardiology consulted and planned cardioversion today. Hoping this will help his volume status.   Acute mild to moderate COPD exacerbation -Continue aggressive bronchodilators.  Cont Solumedrol.  Acute kidney injury likely secondary to ATN -Cr 2.5. difficult to diurese aggresively in the setting of AKI.   Bilateral upper extremity swelling -Suspect dependent edema.  Advised to raise his arm and put pillows underneath it.  Generalized weakness -Physical therapy recommended CIR.  Will evaluate him day to day. Once his O2 requirement is low- will send him to CIR.   DVT prophylaxis: Xarelto Code Status: DNR Family Communication: Spoke with David Barton, his wife.  Disposition Plan: Will monitor him today after Cardioversion, wean off O2. See how he does. If Better then can go to CIR soon.   Antimicrobials:   Rocephin  Subjective: No new complaints, HR still elevated in 120's. Overall no complaints but difficult to wean off O2.   Review of Systems Otherwise negative except as per HPI, including: General = no fevers, chills, dizziness, malaise, fatigue HEENT/EYES = negative for pain, redness, loss of vision, double vision, blurred vision, loss of hearing, sore throat, hoarseness, dysphagia Cardiovascular= negative for chest pain, palpitation, murmurs, lower extremity swelling Respiratory/lungs= negative for  hemoptysis, wheezing, mucus production Gastrointestinal= negative for nausea, vomiting,, abdominal pain, melena, hematemesis Genitourinary= negative for Dysuria, Hematuria, Change in Urinary Frequency MSK = Negative for arthralgia, myalgias, Back Pain, Joint swelling  Neurology= Negative for headache, seizures, numbness, tingling  Psychiatry= Negative for anxiety, depression, suicidal and homocidal ideation Allergy/Immunology= Medication/Food allergy as  listed  Skin= Negative for Rash, lesions, ulcers, itching  Objective: Vitals:   03/16/19 0404 03/16/19 0406 03/16/19 0816 03/16/19 0956  BP: (!) 151/70  (!) 158/94   Pulse: (!) 107  (!) 116   Resp: 20 18 19    Temp:  97.6 F (36.4 C) 98 F (36.7 C) 97.9 F (36.6 C)  TempSrc:   Oral Oral  SpO2: 92%  92% (!) 86%  Weight:      Height:        Intake/Output Summary (Last 24 hours) at 03/16/2019 1001 Last data filed at 03/16/2019 5462 Gross per 24 hour  Intake 826 ml  Output 650 ml  Net 176 ml   Filed Weights   03/11/19 0500 03/12/19 0500 03/15/19 0618  Weight: 111.6 kg 110.9 kg 113.8 kg    Examination: Constitutional: NAD, calm, comfortable; on 6L  Eyes: PERRL, lids and conjunctivae normal ENMT: Mucous membranes are moist. Posterior pharynx clear of any exudate or lesions.Normal dentition.  Neck: normal, supple, no masses, no thyromegaly Respiratory: diffuse diminished BS Cardiovascular: Regular rate and rhythm, no murmurs / rubs / gallops. No extremity edema. 2+ pedal pulses. No carotid bruits.  Abdomen: no tenderness, no masses palpated. No hepatosplenomegaly. Bowel sounds positive.  Musculoskeletal: no clubbing / cyanosis. No joint deformity upper and lower extremities. Good ROM, no contractures. Normal muscle tone.  Skin: no rashes, lesions, ulcers. No induration Neurologic: CN 2-12 grossly intact. Sensation intact, DTR normal. Strength 4/5 in all 4.  Psychiatric: Normal judgment and insight. Alert and oriented x 3. Normal mood.   Data Reviewed:   CBC: Recent Labs  Lab 03/12/19 0547 03/13/19 0422 03/14/19 0405 03/15/19 0359 03/16/19 0237  WBC 7.3 5.9 6.4 6.5 7.2  HGB 11.5* 10.7* 11.7* 11.6* 11.4*  HCT 36.0* 34.4* 37.6* 36.6* 34.9*  MCV 101.4* 100.3* 100.5* 99.2 97.5  PLT 276 266 283 275 703   Basic Metabolic Panel: Recent Labs  Lab 03/12/19 0547 03/13/19 0422 03/14/19 0405 03/15/19 0359 03/16/19 0237 03/16/19 0757  NA 140 142 140 141 141 142  K 3.5  3.8 3.6 3.9 3.2* 3.2*  CL 105 103 103 99 102 97*  CO2 24 27 29 30 28 29   GLUCOSE 176* 180* 190* 193* 202* 176*  BUN 68* 74* 79* 84* 91* 89*  CREATININE 2.47* 2.46* 2.40* 2.56* 2.64* 2.50*  CALCIUM 8.6* 8.6* 8.1* 8.4* 8.3* 8.6*  MG 1.5* 1.9 1.7 1.7 1.7  --    GFR: Estimated Creatinine Clearance: 28.7 mL/min (A) (by C-G formula based on SCr of 2.5 mg/dL (H)). Liver Function Tests: No results for input(s): AST, ALT, ALKPHOS, BILITOT, PROT, ALBUMIN in the last 168 hours. No results for input(s): LIPASE, AMYLASE in the last 168 hours. No results for input(s): AMMONIA in the last 168 hours. Coagulation Profile: Recent Labs  Lab 03/16/19 0757  INR 2.3*   Cardiac Enzymes: No results for input(s): CKTOTAL, CKMB, CKMBINDEX, TROPONINI in the last 168 hours. BNP (last 3 results) No results for input(s): PROBNP in the last 8760 hours. HbA1C: No results for input(s): HGBA1C in the last 72 hours. CBG: No results for input(s): GLUCAP in the last 168 hours. Lipid Profile: No results for input(s): CHOL, HDL, LDLCALC, TRIG, CHOLHDL, LDLDIRECT in the last 72 hours. Thyroid Function Tests: No results for input(s): TSH, T4TOTAL, FREET4, T3FREE, THYROIDAB in the last 72 hours. Anemia Panel: No results for input(s): VITAMINB12, FOLATE, FERRITIN, TIBC, IRON, RETICCTPCT in the last 72 hours. Sepsis Labs: No results for input(s): PROCALCITON, LATICACIDVEN in the  last 168 hours.  No results found for this or any previous visit (from the past 240 hour(s)).       Radiology Studies: Dg Chest Port 1 View  Result Date: 03/15/2019 CLINICAL DATA:  Tachycardia. Hypertension. History of COPD and hypertension. EXAM: PORTABLE CHEST 1 VIEW COMPARISON:  03/09/2019; 03/05/2019; 03/03/2019 FINDINGS: Grossly unchanged cardiac silhouette and mediastinal contours atherosclerotic plaque within thoracic aorta. The pulmonary vasculature remains indistinct with cephalization. Unchanged small layering effusions and  associated bibasilar heterogeneous/consolidative opacities, left greater right. No new focal airspace opacities. No pneumothorax. No acute osseous abnormalities. IMPRESSION: Similar findings of cardiomegaly, pulmonary edema, small bilateral effusions and associated bibasilar opacities, atelectasis versus infiltrate. Electronically Signed   By: Sandi Mariscal M.D.   On: 03/15/2019 11:03        Scheduled Meds: . [MAR Hold] arformoterol  15 mcg Nebulization BID  . [MAR Hold] budesonide (PULMICORT) nebulizer solution  0.5 mg Nebulization BID  . [MAR Hold] diltiazem  360 mg Oral Daily  . [MAR Hold] docusate sodium  100 mg Oral BID  . [MAR Hold] feeding supplement (ENSURE ENLIVE)  237 mL Oral BID BM  . [MAR Hold] ipratropium  0.5 mg Nebulization BID  . [MAR Hold] levalbuterol  1.25 mg Nebulization BID  . [MAR Hold] mouth rinse  15 mL Mouth Rinse BID  . [MAR Hold] methylPREDNISolone (SOLU-MEDROL) injection  40 mg Intravenous Q8H  . [MAR Hold] potassium chloride  40 mEq Oral BID  . [MAR Hold] rivaroxaban  15 mg Oral Q supper  . [MAR Hold] sodium chloride flush  3 mL Intravenous Q12H   Continuous Infusions: . sodium chloride 20 mL/hr at 03/16/19 0847  . sodium chloride    . [MAR Hold] magnesium sulfate 1 - 4 g bolus IVPB       LOS: 13 days   Time spent= 35 mins    Ankit Arsenio Loader, MD [facility]  If 7PM-7AM, please contact night-coverage [email or web address] 03/16/2019, 10:01 AM

## 2019-03-16 NOTE — Anesthesia Procedure Notes (Signed)
Procedure Name: MAC Date/Time: 03/16/2019 9:27 AM Performed by: Alain Marion, CRNA Pre-anesthesia Checklist: Patient identified, Emergency Drugs available, Suction available and Patient being monitored Oxygen Delivery Method: Nasal cannula Placement Confirmation: positive ETCO2

## 2019-03-16 NOTE — Progress Notes (Signed)
Patient off the unit for TEE cardioversion accompanied by RN's.  Patient was alert and oriented on 6 Liters O2.

## 2019-03-16 NOTE — Anesthesia Postprocedure Evaluation (Signed)
Anesthesia Post Note  Patient: David Barton  Procedure(s) Performed: TRANSESOPHAGEAL ECHOCARDIOGRAM (TEE) (N/A ) CARDIOVERSION (N/A )     Patient location during evaluation: PACU Anesthesia Type: MAC Level of consciousness: awake and alert Pain management: pain level controlled Vital Signs Assessment: post-procedure vital signs reviewed and stable Respiratory status: spontaneous breathing, nonlabored ventilation, respiratory function stable and patient connected to nasal cannula oxygen Cardiovascular status: stable and blood pressure returned to baseline Postop Assessment: no apparent nausea or vomiting Anesthetic complications: no    Last Vitals:  Vitals:   03/16/19 1219 03/16/19 1320  BP:  130/70  Pulse:  (!) 110  Resp:  (!) 21  Temp: 36.6 C   SpO2:  90%    Last Pain:  Vitals:   03/16/19 1219  TempSrc: Oral  PainSc:                  [place]

## 2019-03-16 NOTE — CV Procedure (Signed)
TEE/CARDIOVERSION NOTE  TRANSESOPHAGEAL ECHOCARDIOGRAM (TEE):  Indictation: Atrial Fibrillation  Consent:   Informed consent was obtained prior to the procedure. The risks, benefits and alternatives for the procedure were discussed and the patient comprehended these risks.  Risks include, but are not limited to, cough, sore throat, vomiting, nausea, somnolence, esophageal and stomach trauma or perforation, bleeding, low blood pressure, aspiration, pneumonia, infection, trauma to the teeth and death.    Time Out: Verified patient identification, verified procedure, site/side was marked, verified correct patient position, special equipment/implants available, medications/allergies/relevent history reviewed, required imaging and test results available. Performed  Procedure:  After a procedural time-out, the patient was given propofol per anesthesia for sedation. The patient's heart rate, blood pressure, and oxygen saturation are monitored continuously during the procedure. The transesophageal probe was inserted in the esophagus and stomach without difficulty and multiple views were obtained. Agitated microbubble saline contrast was not administered.  Complications:    Complications: None Patient did tolerate procedure well.  Findings:  1. LEFT VENTRICLE: The left ventricular wall thickness is mildly increased.  The left ventricular cavity is normal in size. Wall motion is normal.  LVEF is 60-65%.  2. RIGHT VENTRICLE:  The right ventricle is normal in structure and function without any thrombus or masses.    3. LEFT ATRIUM:  The left atrium is mildly dilated in size without any thrombus or masses.  There is not spontaneous echo contrast ("smoke") in the left atrium consistent with a low flow state.  4. LEFT ATRIAL APPENDAGE:  The small left atrial appendage is free of any thrombus or masses. The appendage has single lobes. Pulse doppler indicates moderate flow in the appendage.  5.  ATRIAL SEPTUM:  The atrial septum is aneurysmal, but appears intact and is free of thrombus and/or masses. Lipomatous hypertrophy of the septum is noted. There is no evidence for interatrial shunting by color doppler.  6. RIGHT ATRIUM:  The right atrium is normal in size and function without any thrombus or masses.  7. MITRAL VALVE:  The mitral valve is normal in structure and function with trivial regurgitation.  There were no vegetations or stenosis.  8. AORTIC VALVE:  The aortic valve is trileaflet, normal in structure and function with no regurgitation.  There were no vegetations or stenosis  9. TRICUSPID VALVE:  The tricuspid valve is normal in structure and function with trivial regurgitation.  There were no vegetations or stenosis  10.  PULMONIC VALVE:  The pulmonic valve is normal in structure and function with no regurgitation.  There were no vegetations or stenosis.   11. AORTIC ARCH, ASCENDING AND DESCENDING AORTA:  There was no Ron Parker et. Al, 1992) atherosclerosis of the ascending aorta, aortic arch, or proximal descending aorta.  12. PULMONARY VEINS: Anomalous pulmonary venous return was not noted.  13. PERICARDIUM: The pericardium appeared normal and non-thickened.  There is no pericardial effusion.  CARDIOVERSION:     Second Time Out: Verified patient identification, verified procedure, site/side was marked, verified correct patient position, special equipment/implants available, medications/allergies/relevent history reviewed, required imaging and test results available.  Performed  Procedure:  1. Patient placed on cardiac monitor, pulse oximetry, supplemental oxygen as necessary.  2. Sedation administered per anesthesia 3. Pacer pads placed anterior and posterior chest. 4. Cardioverted 1 time(s).  5. Cardioverted at 200J biphasic.  Complications:  Complications: None Patient did tolerate procedure well.  Impression:  1. No LAA thrombus 2. Aneurysmal IAS -  lipomatous hypertrophy of the septum, no PFO  by color doppler 3. Mild LVH 4. LVEF 60-65% 5. Successful DCCV to NSR with frequent PAC's and PVC's  Time Spent Directly with the Patient:  60 minutes   Pixie Casino, MD, [facility], [place] Director of the [facility]  [facility] Diplomate of the American Board of Clinical Lipidology Attending Cardiologist  Direct Dial: [phone]  Fax: [phone]  Website:  [email or web address] Octavis Sheeler 03/16/2019, 9:55 AM

## 2019-03-16 NOTE — Transfer of Care (Signed)
Immediate Anesthesia Transfer of Care Note  Patient: VORIS TIGERT  Procedure(s) Performed: TRANSESOPHAGEAL ECHOCARDIOGRAM (TEE) (N/A ) CARDIOVERSION (N/A )  Patient Location: Endoscopy Unit  Anesthesia Type:MAC  Level of Consciousness: awake, alert  and oriented  Airway & Oxygen Therapy: Patient Spontanous Breathing and Patient connected to nasal cannula oxygen  Post-op Assessment: Report given to RN and Post -op Vital signs reviewed and stable  Post vital signs: Reviewed and stable  Last Vitals:  Vitals Value Taken Time  BP 135/43 03/16/2019  9:56 AM  Temp 36.6 C 03/16/2019  9:56 AM  Pulse 36 03/16/2019  9:57 AM  Resp 22 03/16/2019  9:57 AM  SpO2 87 % 03/16/2019  9:57 AM  Vitals shown include unvalidated device data.  Last Pain:  Vitals:   03/16/19 0956  TempSrc: Oral  PainSc: 0-No pain      Patients Stated Pain Goal: 0 (16/07/37 1062)  Complications: No apparent anesthesia complications

## 2019-03-16 NOTE — Progress Notes (Signed)
Inpatient Rehabilitation-Admissions Coordinator   [facility] continues to follow pt for possible admission. Will need to see how he does with PT/OT once able to participate. Will follow up Monday.   Jhonnie Garner, OTR/L  Rehab Admissions Coordinator  [phone] 03/16/2019 3:12 PM

## 2019-03-17 DIAGNOSIS — J9600 Acute respiratory failure, unspecified whether with hypoxia or hypercapnia: Secondary | ICD-10-CM

## 2019-03-17 DIAGNOSIS — I5021 Acute systolic (congestive) heart failure: Secondary | ICD-10-CM

## 2019-03-17 DIAGNOSIS — J181 Lobar pneumonia, unspecified organism: Secondary | ICD-10-CM

## 2019-03-17 DIAGNOSIS — L8990 Pressure ulcer of unspecified site, unspecified stage: Secondary | ICD-10-CM

## 2019-03-17 DIAGNOSIS — R600 Localized edema: Secondary | ICD-10-CM

## 2019-03-17 LAB — COMPREHENSIVE METABOLIC PANEL
ALT: 20 U/L (ref 0–44)
AST: 20 U/L (ref 15–41)
Albumin: 2.3 g/dL — ABNORMAL LOW (ref 3.5–5.0)
Alkaline Phosphatase: 51 U/L (ref 38–126)
Anion gap: 12 (ref 5–15)
BUN: 101 mg/dL — ABNORMAL HIGH (ref 8–23)
CHLORIDE: 101 mmol/L (ref 98–111)
CO2: 28 mmol/L (ref 22–32)
Calcium: 8.4 mg/dL — ABNORMAL LOW (ref 8.9–10.3)
Creatinine, Ser: 2.64 mg/dL — ABNORMAL HIGH (ref 0.61–1.24)
GFR calc Af Amer: 25 mL/min — ABNORMAL LOW (ref >60)
GFR calc non Af Amer: 21 mL/min — ABNORMAL LOW (ref >60)
Glucose, Bld: 212 mg/dL — ABNORMAL HIGH (ref 70–99)
Potassium: 3.6 mmol/L (ref 3.5–5.1)
Sodium: 141 mmol/L (ref 135–145)
Total Bilirubin: 0.6 mg/dL (ref 0.3–1.2)
Total Protein: 5.1 g/dL — ABNORMAL LOW (ref 6.5–8.1)

## 2019-03-17 LAB — CBC
HCT: 35.4 % — ABNORMAL LOW (ref 39.0–52.0)
Hemoglobin: 11.4 g/dL — ABNORMAL LOW (ref 13.0–17.0)
MCH: 31.3 pg (ref 26.0–34.0)
MCHC: 32.2 g/dL (ref 30.0–36.0)
MCV: 97.3 fL (ref 80.0–100.0)
PLATELETS: 241 10*3/uL (ref 150–400)
RBC: 3.64 MIL/uL — AB (ref 4.22–5.81)
RDW: 15.8 % — ABNORMAL HIGH (ref 11.5–15.5)
WBC: 6.8 10*3/uL (ref 4.0–10.5)
nRBC: 0 % (ref 0.0–0.2)

## 2019-03-17 LAB — MAGNESIUM: Magnesium: 2.1 mg/dL (ref 1.7–2.4)

## 2019-03-17 MED ORDER — POTASSIUM CHLORIDE CRYS ER 10 MEQ PO TBCR
20.0000 meq | EXTENDED_RELEASE_TABLET | Freq: Two times a day (BID) | ORAL | Status: DC
  Administered 2019-03-17 – 2019-03-19 (×5): 20 meq via ORAL
  Filled 2019-03-17 (×7): qty 2

## 2019-03-17 MED ORDER — FUROSEMIDE 10 MG/ML IJ SOLN
40.0000 mg | Freq: Two times a day (BID) | INTRAMUSCULAR | Status: DC
  Administered 2019-03-17 (×2): 40 mg via INTRAVENOUS
  Filled 2019-03-17 (×2): qty 4

## 2019-03-17 MED ORDER — DILTIAZEM HCL ER COATED BEADS 240 MG PO CP24
480.0000 mg | ORAL_CAPSULE | Freq: Every day | ORAL | Status: DC
  Administered 2019-03-18 – 2019-03-20 (×3): 480 mg via ORAL
  Filled 2019-03-17 (×3): qty 2

## 2019-03-17 MED ORDER — METHYLPREDNISOLONE SODIUM SUCC 40 MG IJ SOLR
40.0000 mg | INTRAMUSCULAR | Status: DC
  Administered 2019-03-18: 40 mg via INTRAVENOUS
  Filled 2019-03-17: qty 1

## 2019-03-17 NOTE — Progress Notes (Addendum)
PROGRESS NOTE    David Barton  VOJ:[id number] DOB: 10-25-1933 DOA: 03/03/2019 PCP: Maury Dus, MD    Brief Narrative:  85 year old gentleman with history of COPD, emphysema, PSVT, essential hypertension, systolic heart failure with known ejection fraction of 30%, alcohol abuse, history of colon cancer who was admitted to the intensive care unit on 03/04/2019 with 4 days history of increasing shortness of breath, productive cough.  He was on Tamiflu empirically.  Patient was transferred to intensive care unit from [facility] on ventilator and treated as pneumonia and septic shock.  He has ultimately improved but remains on high flow oxygen.  Currently on medical floor.  Has persistent A. fib, underwent TEE and cardioversion with transient sinus rhythm now back to A. fib.  COVID-19 ruled out.  He has finished his antibiotic therapy.  Developed rapid A. fib new onset in the hospital.   Assessment & Plan:   Active Problems:   Acute respiratory failure ([name])   COPD with acute exacerbation ([name])   Essential hypertension   Community acquired pneumonia of right lung   Acute systolic CHF (congestive heart failure) ([name])   Pulmonary edema   Edema of both legs   Atrial fibrillation with RVR ([name])  Acute hypoxic respiratory failure secondary to community-acquired pneumonia with underlying history of COPD: Patient is still remains on 6 to 8 L of oxygen.  Some clinical improvement today.  Finished antibiotic therapy about a week ago. Continue aggressive bronchodilator therapy.  Ambulation.  Wean down oxygen as able. Patient is on high-dose IV steroids for long time, will start tapering today. Patient is on bronchodilator, steroid inhalers that he will continue.  Acute on chronic congestive heart failure, systolic: It intermittently treated with Lasix.  He had developed AKI.  Currently euvolemic.  Of diuretics today.  New onset A. fib with RVR: Persistent.  On oral Cardizem.  On  Xarelto for anticoagulation.  Underwent TEE with cardioversion on 03/16/2019, developed A. fib overnight.  Will follow cardiology recommendation.  Acute on chronic COPD exacerbation: Stabilizing.  He still with high oxygen demand.  Will taper off steroids.  Acute kidney injury with history of chronic kidney disease stage III: With known baseline creatinine of about 1.3-1.5.  Had been on intermittent diuresis.  Will give him diuretic break today.  Will recheck tomorrow morning.  Physical debility: We will continue to work with PT OT.  He has done some improvement now.  Disposition plan: Still remains on high flow oxygen.  Depends upon improvement next few days, he will either go to acute rehab versus home.   DVT prophylaxis: Xarelto. Code Status: DNR Family Communication: No family at bedside. Disposition Plan: Home versus acute rehab.   Consultants:   PCCM, signed off  Cardiology for A. fib.  Procedures:   TEE with cardioversion.  Antimicrobials:   Rocephin and azithromycin, finished antibiotic therapy.   Subjective: Patient was seen and examined.  He was eating breakfast.  Denies any complaints.  Denies any chest pain or palpitations.  Still on 6 L of oxygen.  He was fairly able to transfer from bed to chair without getting shortness of breath. Overnight events noted. Tachycardia with A. fib.  Objective: Vitals:   03/17/19 0501 03/17/19 0517 03/17/19 0743 03/17/19 0827  BP: 133/79   (!) 157/73  Pulse: 96   (!) 126  Resp: 17   19  Temp: 97.8 F (36.6 C)   97.7 F (36.5 C)  TempSrc: Oral   Oral  SpO2: (!) 85%  90% (!) 87%  Weight:  112 kg    Height:        Intake/Output Summary (Last 24 hours) at 03/17/2019 1008 Last data filed at 03/17/2019 0941 Gross per 24 hour  Intake 613 ml  Output 950 ml  Net -337 ml   Filed Weights   03/12/19 0500 03/15/19 0618 03/17/19 0517  Weight: 110.9 kg 113.8 kg 112 kg    Examination:  General exam: Appears calm and  comfortable .  On 6 L of oxygen.  Comfortable and eating breakfast. Respiratory system: Clear to auscultation. Respiratory effort normal. Cardiovascular system: S1 & S2 heard, irregularly irregular, No JVD, murmurs, rubs, gallops or clicks. No pedal edema. Gastrointestinal system: Abdomen is nondistended, soft and nontender. No organomegaly or masses felt. Normal bowel sounds heard. Central nervous system: Alert and oriented. No focal neurological deficits. Extremities: Symmetric 5 x 5 power. Skin: No rashes, lesions or ulcers Psychiatry: Judgement and insight appear normal. Mood & affect appropriate.     Data Reviewed: I have personally reviewed following labs and imaging studies  CBC: Recent Labs  Lab 03/13/19 0422 03/14/19 0405 03/15/19 0359 03/16/19 0237 03/17/19 0317  WBC 5.9 6.4 6.5 7.2 6.8  HGB 10.7* 11.7* 11.6* 11.4* 11.4*  HCT 34.4* 37.6* 36.6* 34.9* 35.4*  MCV 100.3* 100.5* 99.2 97.5 97.3  PLT 266 283 275 262 354   Basic Metabolic Panel: Recent Labs  Lab 03/13/19 0422 03/14/19 0405 03/15/19 0359 03/16/19 0237 03/16/19 0757 03/17/19 0317  NA 142 140 141 141 142 141  K 3.8 3.6 3.9 3.2* 3.2* 3.6  CL 103 103 99 102 97* 101  CO2 27 29 30 28 29 28   GLUCOSE 180* 190* 193* 202* 176* 212*  BUN 74* 79* 84* 91* 89* 101*  CREATININE 2.46* 2.40* 2.56* 2.64* 2.50* 2.64*  CALCIUM 8.6* 8.1* 8.4* 8.3* 8.6* 8.4*  MG 1.9 1.7 1.7 1.7  --  2.1   GFR: Estimated Creatinine Clearance: 27 mL/min (A) (by C-G formula based on SCr of 2.64 mg/dL (H)). Liver Function Tests: Recent Labs  Lab 03/17/19 0317  AST 20  ALT 20  ALKPHOS 51  BILITOT 0.6  PROT 5.1*  ALBUMIN 2.3*   No results for input(s): LIPASE, AMYLASE in the last 168 hours. No results for input(s): AMMONIA in the last 168 hours. Coagulation Profile: Recent Labs  Lab 03/16/19 0757  INR 2.3*   Cardiac Enzymes: No results for input(s): CKTOTAL, CKMB, CKMBINDEX, TROPONINI in the last 168 hours. BNP (last 3  results) No results for input(s): PROBNP in the last 8760 hours. HbA1C: No results for input(s): HGBA1C in the last 72 hours. CBG: No results for input(s): GLUCAP in the last 168 hours. Lipid Profile: No results for input(s): CHOL, HDL, LDLCALC, TRIG, CHOLHDL, LDLDIRECT in the last 72 hours. Thyroid Function Tests: No results for input(s): TSH, T4TOTAL, FREET4, T3FREE, THYROIDAB in the last 72 hours. Anemia Panel: No results for input(s): VITAMINB12, FOLATE, FERRITIN, TIBC, IRON, RETICCTPCT in the last 72 hours. Sepsis Labs: No results for input(s): PROCALCITON, LATICACIDVEN in the last 168 hours.  No results found for this or any previous visit (from the past 240 hour(s)).       Radiology Studies: Dg Chest Port 1 View  Result Date: 03/15/2019 CLINICAL DATA:  Tachycardia. Hypertension. History of COPD and hypertension. EXAM: PORTABLE CHEST 1 VIEW COMPARISON:  03/09/2019; 03/05/2019; 03/03/2019 FINDINGS: Grossly unchanged cardiac silhouette and mediastinal contours atherosclerotic plaque within thoracic aorta. The pulmonary vasculature remains  indistinct with cephalization. Unchanged small layering effusions and associated bibasilar heterogeneous/consolidative opacities, left greater right. No new focal airspace opacities. No pneumothorax. No acute osseous abnormalities. IMPRESSION: Similar findings of cardiomegaly, pulmonary edema, small bilateral effusions and associated bibasilar opacities, atelectasis versus infiltrate. Electronically Signed   By: Sandi Mariscal M.D.   On: 03/15/2019 11:03        Scheduled Meds: . arformoterol  15 mcg Nebulization BID  . budesonide (PULMICORT) nebulizer solution  0.5 mg Nebulization BID  . diltiazem  360 mg Oral Daily  . docusate sodium  100 mg Oral BID  . feeding supplement (ENSURE ENLIVE)  237 mL Oral BID BM  . ipratropium  0.5 mg Nebulization BID  . levalbuterol  1.25 mg Nebulization BID  . mouth rinse  15 mL Mouth Rinse BID  .  methylPREDNISolone (SOLU-MEDROL) injection  40 mg Intravenous Q8H  . rivaroxaban  15 mg Oral Q supper  . sodium chloride flush  3 mL Intravenous Q12H   Continuous Infusions: . sodium chloride       LOS: 14 days    Time spent: 25 minutes    Barb Merino, MD [facility] Pager [phone]  If 7PM-7AM, please contact night-coverage [email or web address] Password [email or web address] 03/17/2019, 10:08 AM

## 2019-03-17 NOTE — Progress Notes (Signed)
Notified by Tele at [phone] patient Vent Bigeminy and PVC's. NP [place] notified at [place]; [place] ordered.

## 2019-03-17 NOTE — Progress Notes (Addendum)
Progress Note  Patient Name: David Barton Date of Encounter: 03/17/2019  Primary Cardiologist: Quay Burow, MD   Subjective   He feels more SOB and with worsening LE edema today  Inpatient Medications    Scheduled Meds: . arformoterol  15 mcg Nebulization BID  . budesonide (PULMICORT) nebulizer solution  0.5 mg Nebulization BID  . [START ON 03/18/2019] diltiazem  480 mg Oral Daily  . docusate sodium  100 mg Oral BID  . feeding supplement (ENSURE ENLIVE)  237 mL Oral BID BM  . ipratropium  0.5 mg Nebulization BID  . levalbuterol  1.25 mg Nebulization BID  . mouth rinse  15 mL Mouth Rinse BID  . [START ON 03/18/2019] methylPREDNISolone (SOLU-MEDROL) injection  40 mg Intravenous Q24H  . rivaroxaban  15 mg Oral Q supper  . sodium chloride flush  3 mL Intravenous Q12H   Continuous Infusions: . sodium chloride     PRN Meds: alum & mag hydroxide-simeth, hydrocortisone, hydrocortisone cream, lip balm, loratadine, Muscle Rub, phenol, polyethylene glycol, polyvinyl alcohol, senna-docusate, sodium chloride, sodium chloride flush   Vital Signs    Vitals:   03/17/19 0501 03/17/19 0517 03/17/19 0743 03/17/19 0827  BP: 133/79   (!) 157/73  Pulse: 96   (!) 126  Resp: 17   19  Temp: 97.8 F (36.6 C)   97.7 F (36.5 C)  TempSrc: Oral   Oral  SpO2: (!) 85%  90% (!) 87%  Weight:  112 kg    Height:        Intake/Output Summary (Last 24 hours) at 03/17/2019 1102 Last data filed at 03/17/2019 1031 Gross per 24 hour  Intake 613 ml  Output 1550 ml  Net -937 ml   Last 3 Weights 03/17/2019 03/15/2019 03/12/2019  Weight (lbs) 246 lb 14.6 oz 250 lb 14.4 oz 244 lb 7.8 oz  Weight (kg) 112 kg 113.807 kg 110.9 kg     Telemetry    Atrial fibrillation not s/p DCCV to sinus - Personally Reviewed  Physical Exam   GEN: No acute distress.   Neck: supple Cardiac: RRR Respiratory: decreased BS at both bases GI: Soft, nontender, non-distended  MS: 2+ edema with erythema B/L Neuro:   Nonfocal  Psych: Normal affect   Labs    Chemistry Recent Labs  Lab 03/16/19 0237 03/16/19 0757 03/17/19 0317  NA 141 142 141  K 3.2* 3.2* 3.6  CL 102 97* 101  CO2 28 29 28   GLUCOSE 202* 176* 212*  BUN 91* 89* 101*  CREATININE 2.64* 2.50* 2.64*  CALCIUM 8.3* 8.6* 8.4*  PROT  --   --  5.1*  ALBUMIN  --   --  2.3*  AST  --   --  20  ALT  --   --  20  ALKPHOS  --   --  51  BILITOT  --   --  0.6  GFRNONAA 21* 23* 21*  GFRAA 25* 27* 25*  ANIONGAP 11 16* 12     Hematology Recent Labs  Lab 03/15/19 0359 03/16/19 0237 03/17/19 0317  WBC 6.5 7.2 6.8  RBC 3.69* 3.58* 3.64*  HGB 11.6* 11.4* 11.4*  HCT 36.6* 34.9* 35.4*  MCV 99.2 97.5 97.3  MCH 31.4 31.8 31.3  MCHC 31.7 32.7 32.2  RDW 15.6* 15.7* 15.8*  PLT 275 262 241    BNP Recent Labs  Lab 03/11/19 0438 03/14/19 0405  BNP 640.1* 457.1*     Radiology    No results found.  Patient Profile     85 year old male with past medical history of diastolic congestive heart failure, supraventricular tachycardia, hypertension, colon cancer, COPD admitted with pneumonia who we are asked to evaluate for atrial fibrillation and acute on chronic diastolic congestive heart failure.  Patient admitted March 15 with pneumonia requiring intubation.  He has slowly improved.  He developed atrial fibrillation with rapid ventricular response and has been treated with Cardizem.  He developed worsening edema requiring diuresis and subsequently progressive renal insuff.  Cardiology asked to evaluate.   Echocardiogram shows normal LV function.  Assessment & Plan    1 new onset atrial fibrillation-patient is now status post TEE guided cardioversion, however frequent PACs and PVC and ultimately went back to a-fib after just few hours.  I will increase Cardizem to 480 mg daily for rate control. Continue Xarelto.    2 acute on chronic diastolic congestive heart failure-volume status worsening  - start lasix 40 mg iv BID - follow BUN and  creatinine and K closely.  3 acute on chronic stage III kidney disease-renal function unchanged compared to yesterday.  Follow BUN and Cr.  4 status post pneumonia-Per primary care.  5 hypokalemia-supplement.  For questions or updates, please contact [place] Please consult [email or web address] for contact info under     Signed, Ena Dawley, MD  03/17/2019, 11:02 AM

## 2019-03-18 ENCOUNTER — Encounter (HOSPITAL_COMMUNITY): Payer: Self-pay | Admitting: Internal Medicine

## 2019-03-18 LAB — BASIC METABOLIC PANEL
Anion gap: 14 (ref 5–15)
BUN: 110 mg/dL — ABNORMAL HIGH (ref 8–23)
CO2: 30 mmol/L (ref 22–32)
Calcium: 8.7 mg/dL — ABNORMAL LOW (ref 8.9–10.3)
Chloride: 96 mmol/L — ABNORMAL LOW (ref 98–111)
Creatinine, Ser: 2.41 mg/dL — ABNORMAL HIGH (ref 0.61–1.24)
GFR calc Af Amer: 28 mL/min — ABNORMAL LOW (ref >60)
GFR calc non Af Amer: 24 mL/min — ABNORMAL LOW (ref >60)
Glucose, Bld: 181 mg/dL — ABNORMAL HIGH (ref 70–99)
Potassium: 4.3 mmol/L (ref 3.5–5.1)
SODIUM: 140 mmol/L (ref 135–145)

## 2019-03-18 LAB — CBC
HCT: 35.8 % — ABNORMAL LOW (ref 39.0–52.0)
Hemoglobin: 11.7 g/dL — ABNORMAL LOW (ref 13.0–17.0)
MCH: 31.7 pg (ref 26.0–34.0)
MCHC: 32.7 g/dL (ref 30.0–36.0)
MCV: 97 fL (ref 80.0–100.0)
Platelets: 224 10*3/uL (ref 150–400)
RBC: 3.69 MIL/uL — ABNORMAL LOW (ref 4.22–5.81)
RDW: 16.1 % — AB (ref 11.5–15.5)
WBC: 8.3 10*3/uL (ref 4.0–10.5)
nRBC: 0 % (ref 0.0–0.2)

## 2019-03-18 LAB — MAGNESIUM: MAGNESIUM: 2 mg/dL (ref 1.7–2.4)

## 2019-03-18 MED ORDER — METOLAZONE 2.5 MG PO TABS
2.5000 mg | ORAL_TABLET | Freq: Once | ORAL | Status: AC
  Administered 2019-03-18: 2.5 mg via ORAL
  Filled 2019-03-18: qty 1

## 2019-03-18 MED ORDER — METHYLPREDNISOLONE SODIUM SUCC 40 MG IJ SOLR
20.0000 mg | INTRAMUSCULAR | Status: AC
  Administered 2019-03-19: 20 mg via INTRAVENOUS
  Filled 2019-03-18: qty 1

## 2019-03-18 MED ORDER — FUROSEMIDE 10 MG/ML IJ SOLN
60.0000 mg | Freq: Two times a day (BID) | INTRAMUSCULAR | Status: DC
  Administered 2019-03-18 – 2019-03-19 (×3): 60 mg via INTRAVENOUS
  Filled 2019-03-18 (×3): qty 6

## 2019-03-18 NOTE — Progress Notes (Signed)
PROGRESS NOTE    David Barton  WFU:[id number] DOB: [birth year] DOA: 03/03/2019 PCP: Maury Dus, MD    Brief Narrative:  85 year old gentleman with history of COPD, emphysema, PSVT, essential hypertension, systolic heart failure with known ejection fraction of 30%, alcohol abuse, history of colon cancer who was admitted to the intensive care unit on 03/04/2019 with 4 days history of increasing shortness of breath, productive cough.  He was on Tamiflu empirically.  Patient was transferred to intensive care unit from [facility] on ventilator and treated as pneumonia and septic shock.  He has ultimately improved but remains on high flow oxygen.  Currently on medical floor.  Has persistent A. fib, underwent TEE and cardioversion with transient sinus rhythm now back to A. fib.  COVID-19 ruled out.  He has finished his antibiotic therapy.  Developed rapid A. fib new onset in the hospital.  Subjective:  Patient in bed, appears comfortable, denies any headache, no fever, no chest pain or pressure, +ve orthopnea & shortness of breath , no cough, no abdominal pain. No focal weakness.   Assessment & Plan:   Acute hypoxic respiratory failure secondary to chronic diastolic CHF EF preserved at 60% due to A. fib aVR due to Afib RVR: Doubt he had pneumonia he has finished antibiotic treatment, his Covid testing was negative, will aggressively diurese with IV Lasix and Zaroxolyn.  Fluid and salt restriction.  Continue supportive care with oxygen as needed.  Reevaluate tomorrow. Taper off steroids.    New onset A. fib with RVR [place] vas 2 score of at least 3: Echo noted, TSH stable, on Cardizem and Xarelto continue.  Case discussed with cardiologist today.  COPD no wheezing.  No exacerbation: supportive care stop steroids.  Acute kidney injury with history of chronic kidney disease stage III: With known baseline creatinine of about 1.3-1.5.  Had been on intermittent diuresis.  Will give him  diuretic break today.  Will recheck tomorrow morning.  Physical debility: We will continue to work with PT OT.  He has done some improvement now.  Disposition plan: Still remains on high flow oxygen.  Depends upon improvement next few days, he will either go to acute rehab versus home.   DVT prophylaxis: Xarelto. Code Status: DNR Family Communication: No family at bedside. Disposition Plan: Home versus acute rehab.   Consultants:   PCCM, signed off  Cardiology for A. fib.  Procedures:   TEE with cardioversion.  Antimicrobials:   Rocephin and azithromycin, finished antibiotic therapy.    Objective: Vitals:   03/18/19 0713 03/18/19 0716 03/18/19 0717 03/18/19 0919  BP:    (!) 145/72  Pulse:    (!) 108  Resp:    20  Temp:    97.8 F (36.6 C)  TempSrc:    Oral  SpO2: 91% 92% 93% 90%  Weight:      Height:        Intake/Output Summary (Last 24 hours) at 03/18/2019 1058 Last data filed at 03/18/2019 0900 Gross per 24 hour  Intake 682 ml  Output 3300 ml  Net -2618 ml   Filed Weights   03/15/19 0618 03/17/19 0517 03/18/19 0614  Weight: 113.8 kg 112 kg 113.4 kg    Examination:  Awake Alert, Oriented X 3, No new F.N deficits, Normal affect [place].AT,PERRAL Supple Neck,No JVD, No cervical lymphadenopathy appriciated.  Symmetrical Chest wall movement, Good air movement bilaterally, few rales iRRR,No Gallops, Rubs or new Murmurs, No Parasternal Heave +ve B.Sounds, Abd Soft, No  tenderness, No organomegaly appriciated, No rebound - guarding or rigidity. No Cyanosis, Clubbing , trace edema, No new Rash or bruise   Data Reviewed: I have personally reviewed following labs and imaging studies  CBC: Recent Labs  Lab 03/14/19 0405 03/15/19 0359 03/16/19 0237 03/17/19 0317 03/18/19 0403  WBC 6.4 6.5 7.2 6.8 8.3  HGB 11.7* 11.6* 11.4* 11.4* 11.7*  HCT 37.6* 36.6* 34.9* 35.4* 35.8*  MCV 100.5* 99.2 97.5 97.3 97.0  PLT 283 275 262 241 253   Basic Metabolic Panel:  Recent Labs  Lab 03/14/19 0405 03/15/19 0359 03/16/19 0237 03/16/19 0757 03/17/19 0317 03/18/19 0403  NA 140 141 141 142 141 140  K 3.6 3.9 3.2* 3.2* 3.6 4.3  CL 103 99 102 97* 101 96*  CO2 29 30 28 29 28 30   GLUCOSE 190* 193* 202* 176* 212* 181*  BUN 79* 84* 91* 89* 101* 110*  CREATININE 2.40* 2.56* 2.64* 2.50* 2.64* 2.41*  CALCIUM 8.1* 8.4* 8.3* 8.6* 8.4* 8.7*  MG 1.7 1.7 1.7  --  2.1 2.0   GFR: Estimated Creatinine Clearance: 29.7 mL/min (A) (by C-G formula based on SCr of 2.41 mg/dL (H)). Liver Function Tests: Recent Labs  Lab 03/17/19 0317  AST 20  ALT 20  ALKPHOS 51  BILITOT 0.6  PROT 5.1*  ALBUMIN 2.3*   No results for input(s): LIPASE, AMYLASE in the last 168 hours. No results for input(s): AMMONIA in the last 168 hours. Coagulation Profile: Recent Labs  Lab 03/16/19 0757  INR 2.3*   Cardiac Enzymes: No results for input(s): CKTOTAL, CKMB, CKMBINDEX, TROPONINI in the last 168 hours. BNP (last 3 results) No results for input(s): PROBNP in the last 8760 hours. HbA1C: No results for input(s): HGBA1C in the last 72 hours. CBG: No results for input(s): GLUCAP in the last 168 hours. Lipid Profile: No results for input(s): CHOL, HDL, LDLCALC, TRIG, CHOLHDL, LDLDIRECT in the last 72 hours. Thyroid Function Tests: No results for input(s): TSH, T4TOTAL, FREET4, T3FREE, THYROIDAB in the last 72 hours. Anemia Panel: No results for input(s): VITAMINB12, FOLATE, FERRITIN, TIBC, IRON, RETICCTPCT in the last 72 hours. Sepsis Labs: No results for input(s): PROCALCITON, LATICACIDVEN in the last 168 hours.  No results found for this or any previous visit (from the past 240 hour(s)).       Radiology Studies: No results found.    Scheduled Meds: . arformoterol  15 mcg Nebulization BID  . budesonide (PULMICORT) nebulizer solution  0.5 mg Nebulization BID  . diltiazem  480 mg Oral Daily  . docusate sodium  100 mg Oral BID  . feeding supplement (ENSURE ENLIVE)   237 mL Oral BID BM  . furosemide  60 mg Intravenous BID  . ipratropium  0.5 mg Nebulization BID  . levalbuterol  1.25 mg Nebulization BID  . mouth rinse  15 mL Mouth Rinse BID  . methylPREDNISolone (SOLU-MEDROL) injection  40 mg Intravenous Q24H  . metolazone  2.5 mg Oral Once  . potassium chloride  20 mEq Oral BID  . rivaroxaban  15 mg Oral Q supper  . sodium chloride flush  3 mL Intravenous Q12H   Continuous Infusions: . sodium chloride       LOS: 15 days    Time spent: 25 minutes   Signature  Lala Lund M.D on 03/18/2019 at 11:03 AM   -  To page go to [email or web address]

## 2019-03-18 NOTE — Progress Notes (Signed)
Progress Note  Patient Name: David Barton Date of Encounter: 03/18/2019  Primary Cardiologist: Quay Burow, MD   Subjective   He feels slightly better, LE edema is improved.  Inpatient Medications    Scheduled Meds: . arformoterol  15 mcg Nebulization BID  . budesonide (PULMICORT) nebulizer solution  0.5 mg Nebulization BID  . diltiazem  480 mg Oral Daily  . docusate sodium  100 mg Oral BID  . feeding supplement (ENSURE ENLIVE)  237 mL Oral BID BM  . furosemide  60 mg Intravenous BID  . ipratropium  0.5 mg Nebulization BID  . levalbuterol  1.25 mg Nebulization BID  . mouth rinse  15 mL Mouth Rinse BID  . methylPREDNISolone (SOLU-MEDROL) injection  40 mg Intravenous Q24H  . metolazone  2.5 mg Oral Once  . potassium chloride  20 mEq Oral BID  . rivaroxaban  15 mg Oral Q supper  . sodium chloride flush  3 mL Intravenous Q12H   Continuous Infusions: . sodium chloride     PRN Meds: alum & mag hydroxide-simeth, hydrocortisone, hydrocortisone cream, lip balm, loratadine, Muscle Rub, phenol, polyethylene glycol, polyvinyl alcohol, senna-docusate, sodium chloride, sodium chloride flush   Vital Signs    Vitals:   03/18/19 0713 03/18/19 0716 03/18/19 0717 03/18/19 0919  BP:    (!) 145/72  Pulse:    (!) 108  Resp:    20  Temp:    97.8 F (36.6 C)  TempSrc:    Oral  SpO2: 91% 92% 93% 90%  Weight:      Height:        Intake/Output Summary (Last 24 hours) at 03/18/2019 1048 Last data filed at 03/18/2019 0900 Gross per 24 hour  Intake 682 ml  Output 3300 ml  Net -2618 ml   Last 3 Weights 03/18/2019 03/17/2019 03/15/2019  Weight (lbs) 250 lb 246 lb 14.6 oz 250 lb 14.4 oz  Weight (kg) 113.4 kg 112 kg 113.807 kg     Telemetry    Atrial fibrillation not s/p DCCV to sinus - Personally Reviewed  Physical Exam   GEN: No acute distress.   Neck: supple Cardiac: RRR Respiratory: decreased BS at both bases GI: Soft, nontender, non-distended  MS: 2+ edema with erythema  B/L Neuro:  Nonfocal  Psych: Normal affect   Labs    Chemistry Recent Labs  Lab 03/16/19 0757 03/17/19 0317 03/18/19 0403  NA 142 141 140  K 3.2* 3.6 4.3  CL 97* 101 96*  CO2 29 28 30   GLUCOSE 176* 212* 181*  BUN 89* 101* 110*  CREATININE 2.50* 2.64* 2.41*  CALCIUM 8.6* 8.4* 8.7*  PROT  --  5.1*  --   ALBUMIN  --  2.3*  --   AST  --  20  --   ALT  --  20  --   ALKPHOS  --  51  --   BILITOT  --  0.6  --   GFRNONAA 23* 21* 24*  GFRAA 27* 25* 28*  ANIONGAP 16* 12 14     Hematology Recent Labs  Lab 03/16/19 0237 03/17/19 0317 03/18/19 0403  WBC 7.2 6.8 8.3  RBC 3.58* 3.64* 3.69*  HGB 11.4* 11.4* 11.7*  HCT 34.9* 35.4* 35.8*  MCV 97.5 97.3 97.0  MCH 31.8 31.3 31.7  MCHC 32.7 32.2 32.7  RDW 15.7* 15.8* 16.1*  PLT 262 241 224    BNP Recent Labs  Lab 03/14/19 0405  BNP 457.1*     Radiology  No results found.     Patient Profile     85 year old male with past medical history of diastolic congestive heart failure, supraventricular tachycardia, hypertension, colon cancer, COPD admitted with pneumonia who we are asked to evaluate for atrial fibrillation and acute on chronic diastolic congestive heart failure.  Patient admitted March 15 with pneumonia requiring intubation.  He has slowly improved.  He developed atrial fibrillation with rapid ventricular response and has been treated with Cardizem.  He developed worsening edema requiring diuresis and subsequently progressive renal insuff.  Cardiology asked to evaluate.   Echocardiogram shows normal LV function.  Assessment & Plan    1 new onset atrial fibrillation - patient is now status post TEE guided cardioversion, however frequent PACs and PVC and ultimately went back to a-fib after just few hours.  - continue Cardizem to 480 mg daily for rate control, HR around 100. Continue Xarelto.    2 acute on chronic diastolic congestive heart failure-volume status worsening  - I agree with increased lasix 60 mg  iv BID and adding metolazone - follow BUN and creatinine and K closely.  3 acute on chronic stage III kidney disease-renal function unchanged compared to yesterday.  Follow BUN and Cr.  4 status post pneumonia-Per primary care.  5 hypokalemia-supplement.  For questions or updates, please contact [place] Please consult [email or web address] for contact info under     Signed, Ena Dawley, MD  03/18/2019, 10:48 AM

## 2019-03-19 ENCOUNTER — Inpatient Hospital Stay (HOSPITAL_COMMUNITY): Payer: Medicare Other

## 2019-03-19 LAB — BASIC METABOLIC PANEL
Anion gap: 12 (ref 5–15)
BUN: 124 mg/dL — ABNORMAL HIGH (ref 8–23)
CO2: 33 mmol/L — ABNORMAL HIGH (ref 22–32)
Calcium: 8.9 mg/dL (ref 8.9–10.3)
Chloride: 96 mmol/L — ABNORMAL LOW (ref 98–111)
Creatinine, Ser: 2.51 mg/dL — ABNORMAL HIGH (ref 0.61–1.24)
GFR calc Af Amer: 26 mL/min — ABNORMAL LOW (ref >60)
GFR calc non Af Amer: 23 mL/min — ABNORMAL LOW (ref >60)
Glucose, Bld: 168 mg/dL — ABNORMAL HIGH (ref 70–99)
Potassium: 4.5 mmol/L (ref 3.5–5.1)
Sodium: 141 mmol/L (ref 135–145)

## 2019-03-19 LAB — MAGNESIUM: Magnesium: 1.9 mg/dL (ref 1.7–2.4)

## 2019-03-19 MED ORDER — AMIODARONE HCL 200 MG PO TABS
400.0000 mg | ORAL_TABLET | Freq: Two times a day (BID) | ORAL | Status: DC
  Administered 2019-03-19 – 2019-03-26 (×15): 400 mg via ORAL
  Filled 2019-03-19 (×15): qty 2

## 2019-03-19 MED ORDER — ACETAZOLAMIDE ER 500 MG PO CP12
500.0000 mg | ORAL_CAPSULE | Freq: Two times a day (BID) | ORAL | Status: DC
  Filled 2019-03-19: qty 1

## 2019-03-19 MED ORDER — METOLAZONE 2.5 MG PO TABS
2.5000 mg | ORAL_TABLET | Freq: Once | ORAL | Status: DC
  Filled 2019-03-19: qty 1

## 2019-03-19 NOTE — Progress Notes (Signed)
Inpatient Rehabilitation-Admissions Coordinator   Noted continued need for post acute rehab. AC will continue to follow for medical readiness and possible CIR admission.   Jhonnie Garner, OTR/L  Rehab Admissions Coordinator  [phone] 03/19/2019 5:23 PM

## 2019-03-19 NOTE — Progress Notes (Addendum)
Occupational Therapy Treatment Patient Details Name: EKAM BESSON MRN: [id number] DOB: [birth year] Today's Date: 03/19/2019    History of present illness 85 year old man prior tobacco, psvt, systolic hf ef 23-55% with normalization to 50's osa not on therapy, with a 4-day history of increasing shortness of breath with productive cough.  Flu swab negative at his physician's office on 3/12.  On Tamiflu empirically.  Developed fever and rigors 3/13 and was increasingly short of breath and altered this morning prompting his wife to call EMS. Recovery complicated by afib with RVR; Prior history of pneumonia on background of COPD from remote smoking. No recent travel, no sick contacts.   OT comments  Pt continues to make progress toward goals. He is very motivated to participate in therapy.  With functional mobility to sink his O2 sats drop but recover with approximately 5 minutes of sitting.  During rest break at sink, he participated in grooming tasks. Therapist discussed importance of rest breaks and self monitoring. Will continue to follow acutely. Continue to recommend CIR to further progress rehab before eventual return home.  Follow Up Recommendations  CIR    Equipment Recommendations  None recommended by OT    Recommendations for Other Services Rehab consult    Precautions / Restrictions Precautions Precautions: Fall Precaution Comments: watch O2 Sats and HR        Mobility Bed Mobility               General bed mobility comments: OOB in chair   Transfers Overall transfer level: Needs assistance Equipment used: Rolling walker (2 wheeled) Transfers: Sit to/from Stand Sit to Stand: Min assist;Mod assist         General transfer comment: Min assist to stand form recliner using armrests; Mod assist to steady and power up to stand from small chair (without armrests) in front of sink    Balance     Sitting balance-Leahy Scale: Good       Standing balance-Leahy  Scale: Fair                             ADL either performed or assessed with clinical judgement   ADL Overall ADL's : Needs assistance/impaired     Grooming: Wash/dry hands;Oral care;Brushing hair;Set up;Sitting           Upper Body Dressing : Minimal assistance;Sitting       Toilet Transfer: +2 for physical assistance;Minimal assistance;RW;Ambulation Toilet Transfer Details (indicate cue type and reason): Simulated with transfer out of recliner to chair         Functional mobility during ADLs: +2 for physical assistance;Min guard;Rolling walker       Vision       Perception     Praxis      Cognition Arousal/Alertness: Awake/alert Behavior During Therapy: WFL for tasks assessed/performed Overall Cognitive Status: Within Functional Limits for tasks assessed                                          Exercises     Shoulder Instructions       General Comments pt on 6L supplemental O2,  and then 8 and then to 10L O2 via [place] for mobility and 8L at rest; SpO2 desat to 78% (observed lowest) while mobilizing and back to 89-91% at rest; HR range 78 to 95 bpm  throughout session    Pertinent Vitals/ Pain       Pain Assessment: No/denies pain Faces Pain Scale: No hurt  Home Living                                          Prior Functioning/Environment              Frequency  Min 3X/week        Progress Toward Goals  OT Goals(current goals can now be found in the care plan section)  Progress towards OT goals: Progressing toward goals  Acute Rehab OT Goals Patient Stated Goal: get stronger; so wants to walk in the hallway OT Goal Formulation: With patient Time For Goal Achievement: 03/29/19 Potential to Achieve Goals: Good ADL Goals Pt Will Perform Lower Body Bathing: with modified independence;sit to/from stand Pt Will Transfer to Toilet: with modified independence;ambulating Pt Will Perform Tub/Shower  Transfer: with modified independence;ambulating Additional ADL Goal #1: Pt will independently verbalize 3 energy conservation strategies Additional ADL Goal #2: Pt will independently verbalize 3 strategies to prevent falls  Plan Discharge plan remains appropriate    Co-evaluation      Reason for Co-Treatment: For patient/therapist safety;To address functional/ADL transfers;Other (comment)(due to activity tolerance) PT goals addressed during session: Mobility/safety with mobility OT goals addressed during session: ADL's and self-care      AM-PAC OT "6 Clicks" Daily Activity     Outcome Measure   Help from another person eating meals?: None Help from another person taking care of personal grooming?: None Help from another person toileting, which includes using toliet, bedpan, or urinal?: A Little Help from another person bathing (including washing, rinsing, drying)?: A Little Help from another person to put on and taking off regular upper body clothing?: A Little Help from another person to put on and taking off regular lower body clothing?: A Little 6 Click Score: 20    End of Session Equipment Utilized During Treatment: Oxygen  OT Visit Diagnosis: Unsteadiness on feet (R26.81);Repeated falls (R29.6)   Activity Tolerance Patient tolerated treatment well(with rest breaks)   Patient Left in chair;with call bell/phone within reach   Nurse Communication Mobility status        Time: 6433-2951 OT Time Calculation (min): 35 min  Charges: OT General Charges $OT Visit: 1 Visit OT Treatments $Self Care/Home Management : 8-22 mins     Darrol Jump OTR/L [place]  [phone] 03/19/2019, 4:06 PM

## 2019-03-19 NOTE — Progress Notes (Addendum)
Progress Note  Patient Name: David Barton Date of Encounter: 03/19/2019  Primary Cardiologist: Quay Burow, MD   Subjective   No CP or dyspnea  Inpatient Medications    Scheduled Meds: . acetaZOLAMIDE  500 mg Oral Q12H  . arformoterol  15 mcg Nebulization BID  . budesonide (PULMICORT) nebulizer solution  0.5 mg Nebulization BID  . diltiazem  480 mg Oral Daily  . docusate sodium  100 mg Oral BID  . feeding supplement (ENSURE ENLIVE)  237 mL Oral BID BM  . furosemide  60 mg Intravenous BID  . ipratropium  0.5 mg Nebulization BID  . levalbuterol  1.25 mg Nebulization BID  . mouth rinse  15 mL Mouth Rinse BID  . metolazone  2.5 mg Oral Once  . potassium chloride  20 mEq Oral BID  . rivaroxaban  15 mg Oral Q supper  . sodium chloride flush  3 mL Intravenous Q12H   Continuous Infusions: . sodium chloride     PRN Meds: alum & mag hydroxide-simeth, hydrocortisone, hydrocortisone cream, lip balm, loratadine, Muscle Rub, phenol, polyethylene glycol, polyvinyl alcohol, senna-docusate, sodium chloride, sodium chloride flush   Vital Signs    Vitals:   03/18/19 2354 03/19/19 0410 03/19/19 0442 03/19/19 0814  BP:   122/78   Pulse:   93 97  Resp:   20 (!) 22  Temp: 97.7 F (36.5 C) 97.7 F (36.5 C) 97.7 F (36.5 C)   TempSrc: Oral Oral Oral   SpO2:   93% 90%  Weight:      Height:        Intake/Output Summary (Last 24 hours) at 03/19/2019 0937 Last data filed at 03/19/2019 0919 Gross per 24 hour  Intake 563 ml  Output 4675 ml  Net -4112 ml   Last 3 Weights 03/18/2019 03/17/2019 03/15/2019  Weight (lbs) 250 lb 246 lb 14.6 oz 250 lb 14.4 oz  Weight (kg) 113.4 kg 112 kg 113.807 kg      Telemetry    Atrial fibrillation rate controlled - Personally Reviewed   Physical Exam   GEN: WD NAD sitting in chair Neck: supple, no JVD Cardiac: irregular, no gallop Respiratory: diminished BS bases GI: Soft, NT/ND; abdominal wall edema MS: 2+ edema Neuro:  No focal  findings  Labs    Chemistry Recent Labs  Lab 03/17/19 0317 03/18/19 0403 03/19/19 0543  NA 141 140 141  K 3.6 4.3 4.5  CL 101 96* 96*  CO2 28 30 33*  GLUCOSE 212* 181* 168*  BUN 101* 110* 124*  CREATININE 2.64* 2.41* 2.51*  CALCIUM 8.4* 8.7* 8.9  PROT 5.1*  --   --   ALBUMIN 2.3*  --   --   AST 20  --   --   ALT 20  --   --   ALKPHOS 51  --   --   BILITOT 0.6  --   --   GFRNONAA 21* 24* 23*  GFRAA 25* 28* 26*  ANIONGAP 12 14 12      Hematology Recent Labs  Lab 03/16/19 0237 03/17/19 0317 03/18/19 0403  WBC 7.2 6.8 8.3  RBC 3.58* 3.64* 3.69*  HGB 11.4* 11.4* 11.7*  HCT 34.9* 35.4* 35.8*  MCV 97.5 97.3 97.0  MCH 31.8 31.3 31.7  MCHC 32.7 32.2 32.7  RDW 15.7* 15.8* 16.1*  PLT 262 241 224    BNP Recent Labs  Lab 03/14/19 0405  BNP 457.1*    Patient Profile     85 year old male  with past medical history of diastolic congestive heart failure, supraventricular tachycardia, hypertension, colon cancer, COPD admitted with pneumonia who we are asked to evaluate for atrial fibrillation and acute on chronic diastolic congestive heart failure.  Patient admitted March 15 with pneumonia requiring intubation.  He has slowly improved.  He developed atrial fibrillation with rapid ventricular response and has been treated with Cardizem.  He developed worsening edema requiring diuresis and subsequently progressive renal insuff.  Cardiology asked to evaluate.   Echocardiogram shows normal LV function. S/p TEE DCCV 3/27. Reverted to atrial fibrillation.   Assessment & Plan    1 new onset atrial fibrillation-patient remains in atrial fibrillation today.  Rate is controlled.  As outlined in previous note he underwent successful TEE guided cardioversion previously but did not hold sinus rhythm.  Atrial fibrillation may be contributing to diastolic congestive heart failure.  We will continue with amiodarone load and plan to repeat cardioversion on Friday.  My hope is that  reestablishing sinus rhythm will improve volume status.  Continue Xarelto.  Continue cardizem.  2 acute on chronic diastolic congestive heart failure-remains volume overloaded on examination.  However BUN significantly elevated.  We will continue to hold diuretics for now.  Hopefully reestablishing sinus rhythm will improve volume status.  3 acute on chronic stage III kidney disease-Hold diuretics; follow BUN and Cr.  4 status post pneumonia-Per primary care.  For questions or updates, please contact [place] Please consult [email or web address] for contact info under        Signed, Kirk Ruths, MD  03/19/2019, 9:37 AM

## 2019-03-19 NOTE — Care Management Important Message (Signed)
Important Message  Patient Details  Name: David Barton MRN: [id number] Date of Birth: 1933/04/21   Medicare Important Message Given:  Yes    [name] 03/19/2019, 2:36 PM

## 2019-03-19 NOTE — Progress Notes (Addendum)
Physical Therapy Treatment Patient Details Name: David Barton MRN: [id number] DOB: 1933/04/14 Today's Date: 03/19/2019    History of Present Illness 85 year old man prior tobacco, psvt, systolic hf ef 10-25% with normalization to 50's osa not on therapy, with a 4-day history of increasing shortness of breath with productive cough.  Flu swab negative at his physician's office on 3/12.  On Tamiflu empirically.  Developed fever and rigors 3/13 and was increasingly short of breath and altered this morning prompting his wife to call EMS. Prior history of pneumonia on background of COPD from remote smoking. No recent travel, no sick contacts.    PT Comments    Continuing work on functional mobility and activity tolerance;  Notable improvements in activity tolerance compared to last week's sessions; David Barton was able to appropriately identify when he is fatigued and needs to sit down, which often coincided with O2 sat drop; Encouraged this self-monitoring when he is moving; O2 sats recover to high 80s/low 90s with approx 5 minutes of sitting (performed a few ADLs during one of those seated rest breaks); He was appreciative and happy to be able to move more; Continue to recommend comprehensive inpatient rehab (CIR) for post-acute therapy needs.   Follow Up Recommendations  CIR     Equipment Recommendations  Rolling walker with 5" wheels;3in1 (PT)    Recommendations for Other Services       Precautions / Restrictions Precautions Precautions: Fall Precaution Comments: watch O2 Sats and HR     Mobility  Bed Mobility               General bed mobility comments: OOB in chair   Transfers Overall transfer level: Needs assistance Equipment used: Rolling walker (2 wheeled);None(and sit to stand at sink ) Transfers: Sit to/from Stand Sit to Stand: Min assist;Mod assist         General transfer comment: Min assist to stand form recliner using armrests; Mod assist to steady and power  up to stand from small chair (without armrests) in front of sink  Ambulation/Gait Ambulation/Gait assistance: Min guard;+2 safety/equipment(helpful for a skilled pair of eyes to monitor vitals) Gait Distance (Feet): 40 Feet(with 2 seated rest breaks) Assistive device: Rolling walker (2 wheeled) Gait Pattern/deviations: Step-through pattern;Decreased step length - right;Decreased step length - left;Trunk flexed Gait velocity: decreased   General Gait Details: Cues to self-monitor for activity tolerance; David Barton voiced that he did feel overall tired in conjunction with o2 sat dropping, and he requested to sit at appropriate times   Stairs             Wheelchair Mobility    Modified Rankin (Stroke Patients Only)       Balance     Sitting balance-Leahy Scale: Good       Standing balance-Leahy Scale: Fair                              Cognition Arousal/Alertness: Awake/alert Behavior During Therapy: WFL for tasks assessed/performed Overall Cognitive Status: Within Functional Limits for tasks assessed                                        Exercises      General Comments General comments (skin integrity, edema, etc.): pt on 6L supplemental O2,  and then 8 and then to 10L O2 via [place] for  mobility and 8L at rest; SpO2 desat to 78% (observed lowest) while mobilizing and back to 89-91% at rest; HR range 78 to 95 bpm throughout session      Pertinent Vitals/Pain Pain Assessment: No/denies pain Faces Pain Scale: No hurt    Home Living                      Prior Function            PT Goals (current goals can now be found in the care plan section) Acute Rehab PT Goals Patient Stated Goal: get stronger; so wants to walk in the hallway PT Goal Formulation: With patient Time For Goal Achievement: 03/23/19 Potential to Achieve Goals: Good Progress towards PT goals: Progressing toward goals(better activity tolerance today)     Frequency    Min 3X/week      PT Plan Current plan remains appropriate    Co-evaluation PT/OT/SLP Co-Evaluation/Treatment: Yes Reason for Co-Treatment: To address functional/ADL transfers;Other (comment);For patient/therapist safety(Based on anticipated activity tolerance) PT goals addressed during session: Mobility/safety with mobility        AM-PAC PT "6 Clicks" Mobility   Outcome Measure  Help needed turning from your back to your side while in a flat bed without using bedrails?: A Little Help needed moving from lying on your back to sitting on the side of a flat bed without using bedrails?: A Little Help needed moving to and from a bed to a chair (including a wheelchair)?: A Little Help needed standing up from a chair using your arms (e.g., wheelchair or bedside chair)?: A Little Help needed to walk in hospital room?: A Little Help needed climbing 3-5 steps with a railing? : A Lot 6 Click Score: 17    End of Session Equipment Utilized During Treatment: Oxygen Activity Tolerance: Patient tolerated treatment well;Other (comment)(with rest breaks prn; performed ADL during one of those rest breaks) Patient left: in chair;with call bell/phone within reach Nurse Communication: Mobility status;Other (comment)(Need to titrate supplemental O2 up) PT Visit Diagnosis: Unsteadiness on feet (R26.81)     Time: 7035-0093 PT Time Calculation (min) (ACUTE ONLY): 35 min  Charges:  $Gait Training: 8-22 mins                     Roney Marion, PT  Acute Rehabilitation Services Pager [phone] Office [place] 03/19/2019, 3:33 PM

## 2019-03-19 NOTE — Progress Notes (Addendum)
PROGRESS NOTE    David Barton  CVE:[id number] DOB: 06-15-1933 DOA: 03/03/2019 PCP: Maury Dus, MD    Brief Narrative:  85 year old gentleman with history of COPD, emphysema, PSVT, essential hypertension, systolic heart failure with known ejection fraction of 30%, alcohol abuse, history of colon cancer who was admitted to the intensive care unit on 03/04/2019 with 4 days history of increasing shortness of breath, productive cough.  He was on Tamiflu empirically.  Patient was transferred to intensive care unit from [facility] on ventilator and treated as pneumonia and septic shock.  He has ultimately improved but remains on high flow oxygen.  Currently on medical floor.  Has persistent A. fib, underwent TEE and cardioversion with transient sinus rhythm now back to A. fib.  COVID-19 ruled out.  He has finished his antibiotic therapy.  Developed rapid A. fib new onset in the hospital.  Subjective:  Patient in bed, appears comfortable, denies any headache, no fever, no chest pain or pressure, no shortness of breath , no abdominal pain. No focal weakness.   Assessment & Plan:   Acute hypoxic respiratory failure secondary to chronic diastolic CHF EF preserved at 60% due to A. fib RVR due to Afib RVR: Pneumonia less likely clinically although he has finished his oral antibiotic regimen, he was also checked for COVID-19 and was negative.  He has been placed on aggressive diuresis on 03/18/2019 with good improvement, continue high-dose IV Lasix and Zaroxolyn.  Since his bicarb has dropped will add Diamox to balance out.  Continue fluid restriction.  Case was also discussed with cardiologist Dr. Meda Coffee on 03/18/2019 she agrees with the plan.  With diuresis patient is improved we will repeat a since he is still quite hypoxic requiring 7 L nasal cannula will check a noncontrast CT chest along with BNP on 03/19/2019.  Continue oxygen supplementation.   New onset A. fib with RVR [place] vas 2 score of  at least 3: Echo noted, TSH stable, on Cardizem and Xarelto continue.  Case discussed with cardiologist today.  COPD no wheezing.  No exacerbation: supportive care stop steroids.  Acute kidney injury with history of chronic kidney disease stage 4: Continue diuresis with caution Baseline creatinine appears to be close to 1.7 but more than 1 year ago, could have progressed further.  Generalized deconditioning: We will continue to work with PT OT.  He has done some improvement now.      DVT prophylaxis: Xarelto. Code Status: DNR Family Communication: No family at bedside. Disposition Plan: Home versus acute rehab.   Consultants:   PCCM, signed off  Cardiology for A. fib.  Procedures:   TEE with cardioversion.  Antimicrobials:   Rocephin and azithromycin, finished antibiotic therapy.  Objective: Vitals:   03/18/19 2354 03/19/19 0410 03/19/19 0442 03/19/19 0814  BP:   122/78   Pulse:   93 97  Resp:   20 (!) 22  Temp: 97.7 F (36.5 C) 97.7 F (36.5 C) 97.7 F (36.5 C)   TempSrc: Oral Oral Oral   SpO2:   93% 90%  Weight:      Height:        Intake/Output Summary (Last 24 hours) at 03/19/2019 0844 Last data filed at 03/19/2019 0529 Gross per 24 hour  Intake 633 ml  Output 3875 ml  Net -3242 ml   Filed Weights   03/15/19 0618 03/17/19 0517 03/18/19 0614  Weight: 113.8 kg 112 kg 113.4 kg    Examination:  Awake Alert, Oriented X  3, No new F.N deficits, Normal affect [place].AT,PERRAL Supple Neck,No JVD, No cervical lymphadenopathy appriciated.  Symmetrical Chest wall movement, Good air movement bilaterally, few rales RRR,No Gallops, Rubs or new Murmurs, No Parasternal Heave +ve B.Sounds, Abd Soft, No tenderness, No organomegaly appriciated, No rebound - guarding or rigidity. No Cyanosis, Clubbing, traceedema, No new Rash or bruise   Data Reviewed: I have personally reviewed following labs and imaging studies  CBC: Recent Labs  Lab 03/14/19 0405 03/15/19 0359  03/16/19 0237 03/17/19 0317 03/18/19 0403  WBC 6.4 6.5 7.2 6.8 8.3  HGB 11.7* 11.6* 11.4* 11.4* 11.7*  HCT 37.6* 36.6* 34.9* 35.4* 35.8*  MCV 100.5* 99.2 97.5 97.3 97.0  PLT 283 275 262 241 532   Basic Metabolic Panel: Recent Labs  Lab 03/15/19 0359 03/16/19 0237 03/16/19 0757 03/17/19 0317 03/18/19 0403 03/19/19 0543  NA 141 141 142 141 140 141  K 3.9 3.2* 3.2* 3.6 4.3 4.5  CL 99 102 97* 101 96* 96*  CO2 30 28 29 28 30  33*  GLUCOSE 193* 202* 176* 212* 181* 168*  BUN 84* 91* 89* 101* 110* 124*  CREATININE 2.56* 2.64* 2.50* 2.64* 2.41* 2.51*  CALCIUM 8.4* 8.3* 8.6* 8.4* 8.7* 8.9  MG 1.7 1.7  --  2.1 2.0 1.9   GFR: Estimated Creatinine Clearance: 28.5 mL/min (A) (by C-G formula based on SCr of 2.51 mg/dL (H)). Liver Function Tests: Recent Labs  Lab 03/17/19 0317  AST 20  ALT 20  ALKPHOS 51  BILITOT 0.6  PROT 5.1*  ALBUMIN 2.3*   No results for input(s): LIPASE, AMYLASE in the last 168 hours. No results for input(s): AMMONIA in the last 168 hours. Coagulation Profile: Recent Labs  Lab 03/16/19 0757  INR 2.3*   Cardiac Enzymes: No results for input(s): CKTOTAL, CKMB, CKMBINDEX, TROPONINI in the last 168 hours. BNP (last 3 results) No results for input(s): PROBNP in the last 8760 hours. HbA1C: No results for input(s): HGBA1C in the last 72 hours. CBG: No results for input(s): GLUCAP in the last 168 hours. Lipid Profile: No results for input(s): CHOL, HDL, LDLCALC, TRIG, CHOLHDL, LDLDIRECT in the last 72 hours. Thyroid Function Tests: No results for input(s): TSH, T4TOTAL, FREET4, T3FREE, THYROIDAB in the last 72 hours. Anemia Panel: No results for input(s): VITAMINB12, FOLATE, FERRITIN, TIBC, IRON, RETICCTPCT in the last 72 hours. Sepsis Labs: No results for input(s): PROCALCITON, LATICACIDVEN in the last 168 hours.  No results found for this or any previous visit (from the past 240 hour(s)).   Radiology Studies: No results found.  Scheduled Meds: .  acetaZOLAMIDE  500 mg Oral Q12H  . arformoterol  15 mcg Nebulization BID  . budesonide (PULMICORT) nebulizer solution  0.5 mg Nebulization BID  . diltiazem  480 mg Oral Daily  . docusate sodium  100 mg Oral BID  . feeding supplement (ENSURE ENLIVE)  237 mL Oral BID BM  . furosemide  60 mg Intravenous BID  . ipratropium  0.5 mg Nebulization BID  . levalbuterol  1.25 mg Nebulization BID  . mouth rinse  15 mL Mouth Rinse BID  . metolazone  2.5 mg Oral Once  . potassium chloride  20 mEq Oral BID  . rivaroxaban  15 mg Oral Q supper  . sodium chloride flush  3 mL Intravenous Q12H   Continuous Infusions: . sodium chloride       LOS: 16 days    Time spent: 25 minutes   Signature  Lala Lund M.D on 03/19/2019 at  8:44 AM   -  To page go to [email or web address]

## 2019-03-20 LAB — IRON AND TIBC
Iron: 132 ug/dL (ref 45–182)
Saturation Ratios: 56 % — ABNORMAL HIGH (ref 17.9–39.5)
TIBC: 234 ug/dL — ABNORMAL LOW (ref 250–450)
UIBC: 102 ug/dL

## 2019-03-20 LAB — BASIC METABOLIC PANEL
Anion gap: 11 (ref 5–15)
BUN: 124 mg/dL — ABNORMAL HIGH (ref 8–23)
CO2: 35 mmol/L — AB (ref 22–32)
Calcium: 8.8 mg/dL — ABNORMAL LOW (ref 8.9–10.3)
Chloride: 97 mmol/L — ABNORMAL LOW (ref 98–111)
Creatinine, Ser: 2.65 mg/dL — ABNORMAL HIGH (ref 0.61–1.24)
GFR calc Af Amer: 25 mL/min — ABNORMAL LOW (ref >60)
GFR calc non Af Amer: 21 mL/min — ABNORMAL LOW (ref >60)
Glucose, Bld: 170 mg/dL — ABNORMAL HIGH (ref 70–99)
Potassium: 4.7 mmol/L (ref 3.5–5.1)
Sodium: 143 mmol/L (ref 135–145)

## 2019-03-20 LAB — MAGNESIUM: Magnesium: 1.9 mg/dL (ref 1.7–2.4)

## 2019-03-20 LAB — BRAIN NATRIURETIC PEPTIDE: B Natriuretic Peptide: 191.2 pg/mL — ABNORMAL HIGH (ref 0.0–100.0)

## 2019-03-20 MED ORDER — DILTIAZEM HCL ER COATED BEADS 240 MG PO CP24: 240.0000 mg | ORAL_CAPSULE | Freq: Every day | ORAL | Status: DC

## 2019-03-20 MED ORDER — FUROSEMIDE 80 MG PO TABS
160.0000 mg | ORAL_TABLET | Freq: Two times a day (BID) | ORAL | Status: DC
  Administered 2019-03-20 – 2019-03-21 (×2): 160 mg via ORAL
  Filled 2019-03-20 (×2): qty 2

## 2019-03-20 MED ORDER — DILTIAZEM HCL ER COATED BEADS 240 MG PO CP24
240.0000 mg | ORAL_CAPSULE | Freq: Every day | ORAL | Status: DC
  Administered 2019-03-20 – 2019-03-21 (×2): 240 mg via ORAL
  Filled 2019-03-20 (×2): qty 1

## 2019-03-20 MED ORDER — LEVALBUTEROL HCL 1.25 MG/0.5ML IN NEBU
1.2500 mg | INHALATION_SOLUTION | Freq: Four times a day (QID) | RESPIRATORY_TRACT | Status: DC | PRN
  Administered 2019-03-20 – 2019-03-25 (×5): 1.25 mg via RESPIRATORY_TRACT
  Filled 2019-03-20 (×5): qty 0.5

## 2019-03-20 MED ORDER — PRO-STAT SUGAR FREE PO LIQD
30.0000 mL | Freq: Three times a day (TID) | ORAL | Status: DC
  Administered 2019-03-20 – 2019-03-26 (×18): 30 mL via ORAL
  Filled 2019-03-20 (×17): qty 30

## 2019-03-20 MED ORDER — ACETYLCYSTEINE 20 % IN SOLN
4.0000 mL | Freq: Two times a day (BID) | RESPIRATORY_TRACT | Status: AC
  Administered 2019-03-21: 20:00:00 4 mL via RESPIRATORY_TRACT
  Filled 2019-03-20 (×4): qty 4

## 2019-03-20 MED ORDER — SODIUM CHLORIDE 3 % IN NEBU: 4.0000 mL | INHALATION_SOLUTION | Freq: Every day | RESPIRATORY_TRACT | Status: DC

## 2019-03-20 NOTE — TOC Initial Note (Signed)
Transition of Care [facility]) - Initial/Assessment Note    Patient Details  Name: David Barton MRN: [id number] Date of Birth: 20-Apr-1933  [facility]) CM/SW Contact:    Benard Halsted, LCSW Phone Number: 03/20/2019, 10:33 AM  Clinical Narrative:                 CSW notes patient and spouse request for CIR at discharge. Will continue to follow for discharge needs.   Expected Discharge Plan: IP Rehab Facility Barriers to Discharge: Continued Medical Work up   Patient Goals and CMS Choice Patient states their goals for this hospitalization and ongoing recovery are:: Rehab      Expected Discharge Plan and Services Expected Discharge Plan: [place] In-house Referral: Clinical Social Work   Post Acute Care Choice: IP Rehab Living arrangements for the past 2 months: Single Family Home                          Prior Living Arrangements/Services Living arrangements for the past 2 months: Single Family Home Lives with:: Spouse Patient language and need for interpreter reviewed:: Yes Do you feel safe going back to the place where you live?: No   Not enough assistance  Need for Family Participation in Patient Care: Yes (Comment) Care giver support system in place?: Yes (comment)   Criminal Activity/Legal Involvement Pertinent to Current Situation/Hospitalization: No - Comment as needed  Activities of Daily Living Home Assistive Devices/Equipment: None ADL Screening (condition at time of admission) Patient's cognitive ability adequate to safely complete daily activities?: Yes Is the patient deaf or have difficulty hearing?: No Does the patient have difficulty seeing, even when wearing glasses/contacts?: No Does the patient have difficulty concentrating, remembering, or making decisions?: No Patient able to express need for assistance with ADLs?: Yes Does the patient have difficulty dressing or bathing?: No Independently performs ADLs?: Yes (appropriate for  developmental age) Does the patient have difficulty walking or climbing stairs?: No Weakness of Legs: None Weakness of Arms/Hands: None  Permission Sought/Granted Permission sought to share information with : Facility Sport and exercise psychologist, Family Supports Permission granted to share information with : Yes, Verbal Permission Granted  Share Information with NAME: Archie Patten     Permission granted to share info w Relationship: Spouse  Permission granted to share info w Contact Information: [phone]  Emotional Assessment Appearance:: Appears stated age Attitude/Demeanor/Rapport: Gracious Affect (typically observed): Accepting, Appropriate Orientation: : Oriented to Place, Oriented to Self, Oriented to  Time, Oriented to Situation Alcohol / Substance Use: Not Applicable Psych Involvement: No (comment)  Admission diagnosis:  sepsis Patient Active Problem List   Diagnosis Date Noted  . Pressure injury of skin 03/17/2019  . Atrial fibrillation with RVR ([place]) 03/11/2019  . Chronic diastolic heart failure ([place]) 01/17/2019  . Edema of both legs 01/02/2019  . Pulmonary edema   . Hypomagnesemia 11/15/2016  . Sleep apnea   . Sepsis ([place]) 11/10/2016  . Elevated troponin 11/10/2016  . Acute systolic CHF (congestive heart failure) ([place]) 11/10/2016  . Cardiomyopathy ([place]) 11/10/2016  . PSVT (paroxysmal supraventricular tachycardia) ([place]) 11/10/2016  . Dysphagia   . Renal insufficiency   . Enteritis due to Clostridium difficile   . Aspiration pneumonia ([place]) 11/04/2016  . Diarrhea 11/04/2016  . AKI (acute kidney injury) ([place]) 11/04/2016  . Hyperkalemia 11/04/2016  . Community acquired pneumonia of right lung 11/03/2016  . Essential hypertension 05/31/2013  . COPD with acute exacerbation ([place]) 05/29/2013  .  Hypokalemia 05/08/2013  . Hypotension 05/08/2013  . Dizziness 05/08/2013  . Hypoxia 05/08/2013  . Acute respiratory failure ([place]) 05/08/2013   PCP:  Maury Dus, MD Pharmacy:    [facility] [place], [place] AT [address] [place] [address] Phone: [phone] Fax: [phone]     Social Determinants of Health (SDOH) Interventions    Readmission Risk Interventions Readmission Risk Prevention Plan 03/20/2019  Transportation Screening Complete  PCP or Specialist Appt within 5-7 Days Complete  Home Care Screening Complete  Medication Review (RN CM) Complete  Some recent data might be hidden

## 2019-03-20 NOTE — Progress Notes (Signed)
PROGRESS NOTE    David Barton  RSW:[id number] DOB: 1933-07-15 DOA: 03/03/2019 PCP: Maury Dus, MD    Brief Narrative:  85 year old gentleman with history of COPD, emphysema, PSVT, essential hypertension, systolic heart failure with known ejection fraction of 30%, alcohol abuse, history of colon cancer who was admitted to the intensive care unit on 03/04/2019 with 4 days history of increasing shortness of breath, productive cough.  He was on Tamiflu empirically.  Patient was transferred to intensive care unit from [facility] on ventilator and treated as pneumonia and septic shock.  He has ultimately improved but remains on high flow oxygen.  Currently on medical floor.  Has persistent A. fib, underwent TEE and cardioversion with transient sinus rhythm now back to A. fib.  COVID-19 ruled out.  He has finished his antibiotic therapy.  Developed rapid A. fib new onset in the hospital.   Subjective:  Patient in bed, appears comfortable, denies any headache, no fever, no chest pain or pressure, +ve shortness of breath , no abdominal pain. No focal weakness.    Assessment & Plan:   Acute hypoxic respiratory failure secondary to chronic diastolic CHF EF preserved at 60% due to A. fib RVR due to Afib RVR: Initially suspicion was for community-acquired pneumonia and he has finished antibiotic course in ICU for tach, he was also checked for COVID-19 and was negative.  He has been aggressively diuresed and now BUN and creatinine has shot up although he still has third spacing and edema, some element of hypoalbuminemia causing third spacing of fluid and relative intravascular depletion.  Diuretics currently held.  Cardiology following, renal has been requested to evaluate as well to direct fluid management.  Pete CT from 03/19/2019 noted.  Requested pulmonary to evaluate they will take a look and get back with me, also case discussed with cardiologist on 03/20/2019 plan is to try and do second  cardioversion possibly tomorrow.  For now continue supportive care with oxygen continues to be on oxygen 7 to 8 L nasal cannula.  Continue I-S and flutter valve for pulmonary toiletry and atelectasis.  Will await pulmonary input.    New onset A. fib with RVR [place] vas 2 score of at least 3: Echo noted, TSH stable, on Cardizem and Xarelto continue.  Case discussed with cardiologist again on 03/20/2019 plan is to cardiovert to achieve sinus rhythm again likely April 1 or 2.  COPD no wheezing.  No exacerbation: supportive care stop steroids.  Acute kidney injury with history of chronic kidney disease stage 4: His baseline creatinine 1.7, for now diuretics held, nephrology consulted.  Generalized deconditioning: We will continue to work with PT OT.  He has done some improvement now.      DVT prophylaxis: Xarelto. Code Status: DNR Family Communication: No family at bedside. Disposition Plan: Home versus acute rehab.   Consultants:   PCCM, signed off  Cardiology for A. Fib.  Nephrology  Procedures:   TEE with cardioversion.  Antimicrobials:   Rocephin and azithromycin, finished antibiotic therapy.  Objective: Vitals:   03/20/19 0427 03/20/19 0500 03/20/19 0730 03/20/19 0926  BP:   122/68 124/67  Pulse:   84 87  Resp:    19  Temp: 98 F (36.7 C)  98.1 F (36.7 C)   TempSrc: Oral  Oral   SpO2:   92% 92%  Weight:  107.4 kg    Height:        Intake/Output Summary (Last 24 hours) at 03/20/2019 1031 Last  data filed at 03/20/2019 0630 Gross per 24 hour  Intake 843 ml  Output 2150 ml  Net -1307 ml   Filed Weights   03/17/19 0517 03/18/19 0614 03/20/19 0500  Weight: 112 kg 113.4 kg 107.4 kg    Examination:  Awake Alert, Oriented X 3, No new F.N deficits, Normal affect [place].AT,PERRAL Supple Neck,No JVD, No cervical lymphadenopathy appriciated.  Symmetrical Chest wall movement, Good air movement bilaterally, few rales RRR,No Gallops, Rubs or new Murmurs, No Parasternal  Heave +ve B.Sounds, Abd Soft, No tenderness, No organomegaly appriciated, No rebound - guarding or rigidity. No Cyanosis, Clubbing, +ve  edema, No new Rash or bruise   Data Reviewed: I have personally reviewed following labs and imaging studies  CBC: Recent Labs  Lab 03/14/19 0405 03/15/19 0359 03/16/19 0237 03/17/19 0317 03/18/19 0403  WBC 6.4 6.5 7.2 6.8 8.3  HGB 11.7* 11.6* 11.4* 11.4* 11.7*  HCT 37.6* 36.6* 34.9* 35.4* 35.8*  MCV 100.5* 99.2 97.5 97.3 97.0  PLT 283 275 262 241 497   Basic Metabolic Panel: Recent Labs  Lab 03/16/19 0237 03/16/19 0757 03/17/19 0317 03/18/19 0403 03/19/19 0543 03/20/19 0431  NA 141 142 141 140 141 143  K 3.2* 3.2* 3.6 4.3 4.5 4.7  CL 102 97* 101 96* 96* 97*  CO2 28 29 28 30  33* 35*  GLUCOSE 202* 176* 212* 181* 168* 170*  BUN 91* 89* 101* 110* 124* 124*  CREATININE 2.64* 2.50* 2.64* 2.41* 2.51* 2.65*  CALCIUM 8.3* 8.6* 8.4* 8.7* 8.9 8.8*  MG 1.7  --  2.1 2.0 1.9 1.9   GFR: Estimated Creatinine Clearance: 26.3 mL/min (A) (by C-G formula based on SCr of 2.65 mg/dL (H)). Liver Function Tests: Recent Labs  Lab 03/17/19 0317  AST 20  ALT 20  ALKPHOS 51  BILITOT 0.6  PROT 5.1*  ALBUMIN 2.3*   No results for input(s): LIPASE, AMYLASE in the last 168 hours. No results for input(s): AMMONIA in the last 168 hours. Coagulation Profile: Recent Labs  Lab 03/16/19 0757  INR 2.3*   Cardiac Enzymes: No results for input(s): CKTOTAL, CKMB, CKMBINDEX, TROPONINI in the last 168 hours. BNP (last 3 results) No results for input(s): PROBNP in the last 8760 hours. HbA1C: No results for input(s): HGBA1C in the last 72 hours. CBG: No results for input(s): GLUCAP in the last 168 hours. Lipid Profile: No results for input(s): CHOL, HDL, LDLCALC, TRIG, CHOLHDL, LDLDIRECT in the last 72 hours. Thyroid Function Tests: No results for input(s): TSH, T4TOTAL, FREET4, T3FREE, THYROIDAB in the last 72 hours. Anemia Panel: No results for  input(s): VITAMINB12, FOLATE, FERRITIN, TIBC, IRON, RETICCTPCT in the last 72 hours. Sepsis Labs: No results for input(s): PROCALCITON, LATICACIDVEN in the last 168 hours.  No results found for this or any previous visit (from the past 240 hour(s)).   Radiology Studies: Ct Chest Wo Contrast  Result Date: 03/19/2019 CLINICAL DATA:  Chronic dyspnea. EXAM: CT CHEST WITHOUT CONTRAST TECHNIQUE: Multidetector CT imaging of the chest was performed following the standard protocol without IV contrast. COMPARISON:  Radiograph March 15, 2019. CT scan of November 03, 2016. FINDINGS: Cardiovascular: Atherosclerosis of thoracic aorta is noted without aneurysm formation. Normal cardiac size. No pericardial effusion. Mild coronary artery calcifications are noted. Mediastinum/Nodes: No enlarged mediastinal or axillary lymph nodes. Thyroid gland, trachea, and esophagus demonstrate no significant findings. Lungs/Pleura: No pneumothorax is noted. Emphysematous disease is noted in both lungs. Complete consolidation of left lower lobe is noted most consistent with atelectasis with small  amount of surrounding pleural fluid. Endobronchial obstruction can not be excluded. Right lower lobe pneumonia or atelectasis is noted. Small right pleural effusion is noted. Minimal subsegmental atelectasis is noted posteriorly in the right lower lobe which is decreased compared to prior exam. Upper Abdomen: No acute abnormality. Musculoskeletal: No chest wall mass or suspicious bone lesions identified. IMPRESSION: Complete consolidation of left lower lobe is noted consistent with atelectasis or possibly pneumonia, with small associated left pleural effusion; endobronchial obstruction can not be excluded. Mild right lower lobe opacity is noted concerning for pneumonia or atelectasis with small right pleural effusion. Mild coronary artery calcifications are noted. Aortic Atherosclerosis (ICD10-I70.0) and Emphysema (ICD10-J43.9). Electronically  Signed   By: Marijo Conception, M.D.   On: 03/19/2019 10:17    Scheduled Meds:  amiodarone  400 mg Oral BID   arformoterol  15 mcg Nebulization BID   budesonide (PULMICORT) nebulizer solution  0.5 mg Nebulization BID   diltiazem  480 mg Oral Daily   docusate sodium  100 mg Oral BID   feeding supplement (ENSURE ENLIVE)  237 mL Oral BID BM   feeding supplement (PRO-STAT SUGAR FREE 64)  30 mL Oral TID WC   ipratropium  0.5 mg Nebulization BID   levalbuterol  1.25 mg Nebulization BID   mouth rinse  15 mL Mouth Rinse BID   rivaroxaban  15 mg Oral Q supper   sodium chloride flush  3 mL Intravenous Q12H   Continuous Infusions:  sodium chloride       LOS: 17 days    Time spent: 25 minutes   Signature  Lala Lund M.D on 03/20/2019 at 10:31 AM   -  To page go to [email or web address]

## 2019-03-20 NOTE — Consult Note (Signed)
Reason for Consult:CKD, AKI Referring Physician: Dr Claudie Fisherman is an 85 y.o. male.  HPI: 85 yr male with hx COPD, HTN, hx ColonCa, EtoH abuse, and hx AKI in 2018.  Admitted now with resp failure on 3/14 and Cr 4.3 Some fluctuation during hospitalization, and Cr has settled to 2.5 -2.7 range. Baseline 1.9-2 12/19.  Similar values when eval by Dr. Moshe Cipro in 2018.  Baseline GFR 29-31, and now 21.  Also had Afib this hosp, and had CV.   Now with ankle edema, desat with walking, vol xs on exam and CXR.  U/S not remarkable earlier this hosp, but no bladder scans done since foley out.  No hx UTIs but had pyuria/hematuria on admit.Marland Kitchen No FH renal dz, eye, hearing or ms skel prob.  No hx stones. ??NSAIDS Constitutional: as above Eyes: negative Ears, nose, mouth, throat, and face: negative Respiratory: some cough Cardiovascular: edema, SOB with mild exertion Gastrointestinal: 3 loose stools/d Genitourinary:noct x 2 Integument/breast: negative Musculoskeletal:arth in hands Neurological: negative Allergic/Immunologic: flexeril  Primary Nephrologist [place].    Past Medical History:  Diagnosis Date  . Alcohol abuse   . Colon cancer ([place])   . COPD (chronic obstructive pulmonary disease) ([place])   . Emphysema of lung ([place])   . Former tobacco use   . Hypertension   . Peripheral edema   . Sepsis ([place]) 10/2016   Aspiration PNA/C diff colitis    Past Surgical History:  Procedure Laterality Date  . CARDIOVERSION N/A 03/16/2019   Procedure: CARDIOVERSION;  Surgeon: Pixie Casino, MD;  Location: [facility] ENDOSCOPY;  Service: Cardiovascular;  Laterality: N/A;  . CATARACT EXTRACTION    . COLON RESECTION    . COLONOSCOPY    . IMPLANTATION BONE ANCHORED HEARING AID Left 2016  . MINOR HEMORRHOIDECTOMY  1995  . TEE WITHOUT CARDIOVERSION N/A 03/16/2019   Procedure: TRANSESOPHAGEAL ECHOCARDIOGRAM (TEE);  Surgeon: Pixie Casino, MD;  Location: [facility] ENDOSCOPY;  Service: Cardiovascular;   Laterality: N/A;  . TONSILLECTOMY      Family History  Problem Relation Age of Onset  . Brain cancer Father   . Colon cancer Mother        with mets to liver  . Lung cancer Brother        was a smoker    Social History:  reports that he quit smoking about 37 years ago. His smoking use included cigarettes. He has a 20.00 pack-year smoking history. He has never used smokeless tobacco. He reports current alcohol use of about 7.0 standard drinks of alcohol per week. He reports that he does not use drugs.  Allergies:  Allergies  Allergen Reactions  . Flexeril [Cyclobenzaprine] Other (See Comments)    Unknown reaction     Medications:  I have reviewed the patient's current medications. Prior to Admission:  Medications Prior to Admission  Medication Sig Dispense Refill Last Dose  . acetaminophen (TYLENOL) 500 MG tablet Take 1,000 mg by mouth every 6 (six) hours as needed for fever.   03/02/2019 at Unknown time  . allopurinol (ZYLOPRIM) 100 MG tablet Take 100 mg by mouth daily.   03/02/2019 at am  . aspirin EC 81 MG tablet Take 81 mg by mouth daily.    03/02/2019 at am  . bisoprolol (ZEBETA) 5 MG tablet Take 5 mg by mouth daily.   03/02/2019 at 11a-12p  . ferrous sulfate 325 (65 FE) MG tablet Take 325 mg by mouth daily.    03/02/2019 at am  .  furosemide (LASIX) 20 MG tablet Take 1 tablet (20 mg total) by mouth daily. 90 tablet 3 03/02/2019 at am  . HYDROcodone-homatropine (HYDROMET) 5-1.5 MG/5ML syrup Take 5 mLs by mouth every 6 (six) hours as needed for cough.   03/02/2019 at Unknown time  . Multiple Vitamin (MULTIVITAMIN WITH MINERALS) TABS tablet Take 1 tablet by mouth daily.   03/02/2019 at am  . oseltamivir (TAMIFLU) 75 MG capsule Take 75 mg by mouth 2 (two) times daily. 5 day course started 03/01/19 pm   03/02/2019 at pm  . predniSONE (DELTASONE) 10 MG tablet Take 10 mg by mouth daily.   03/02/2019 at am  . tiotropium (SPIRIVA) 18 MCG inhalation capsule Place 1 capsule (18 mcg total) into  inhaler and inhale daily. 30 capsule 12 03/02/2019 at am     Results for orders placed or performed during the hospital encounter of 03/03/19 (from the past 48 hour(s))  Basic metabolic panel     Status: Abnormal   Collection Time: 03/19/19  5:43 AM  Result Value Ref Range   Sodium 141 135 - 145 mmol/L   Potassium 4.5 3.5 - 5.1 mmol/L   Chloride 96 (L) 98 - 111 mmol/L   CO2 33 (H) 22 - 32 mmol/L   Glucose, Bld 168 (H) 70 - 99 mg/dL   BUN 124 (H) 8 - 23 mg/dL   Creatinine, Ser 2.51 (H) 0.61 - 1.24 mg/dL   Calcium 8.9 8.9 - 10.3 mg/dL   GFR calc non Af Amer 23 (L) >60 mL/min   GFR calc Af Amer 26 (L) >60 mL/min   Anion gap 12 5 - 15    Comment: Performed at [facility], [address] [place], [place] [address]  Magnesium     Status: None   Collection Time: 03/19/19  5:43 AM  Result Value Ref Range   Magnesium 1.9 1.7 - 2.4 mg/dL    Comment: Performed at [facility] [address] [place], [place] [address]  Basic metabolic panel     Status: Abnormal   Collection Time: 03/20/19  4:31 AM  Result Value Ref Range   Sodium 143 135 - 145 mmol/L   Potassium 4.7 3.5 - 5.1 mmol/L   Chloride 97 (L) 98 - 111 mmol/L   CO2 35 (H) 22 - 32 mmol/L   Glucose, Bld 170 (H) 70 - 99 mg/dL   BUN 124 (H) 8 - 23 mg/dL   Creatinine, Ser 2.65 (H) 0.61 - 1.24 mg/dL   Calcium 8.8 (L) 8.9 - 10.3 mg/dL   GFR calc non Af Amer 21 (L) >60 mL/min   GFR calc Af Amer 25 (L) >60 mL/min   Anion gap 11 5 - 15    Comment: Performed at [facility] [address] [place], [place] [address]  Magnesium     Status: None   Collection Time: 03/20/19  4:31 AM  Result Value Ref Range   Magnesium 1.9 1.7 - 2.4 mg/dL    Comment: Performed at [facility] [address], [place], [place] [address]  Brain natriuretic peptide     Status: Abnormal   Collection Time: 03/20/19  4:31 AM  Result Value Ref Range   B Natriuretic Peptide 191.2 (H) 0.0 - 100.0 pg/mL    Comment: Performed at [facility] [address], [place], [place] [address]    Ct Chest Wo Contrast  Result Date: 03/19/2019 CLINICAL DATA:  Chronic dyspnea. EXAM:  CT CHEST WITHOUT CONTRAST TECHNIQUE: Multidetector CT imaging of the chest was performed following the standard protocol without IV contrast. COMPARISON:  Radiograph March 15, 2019. CT scan of November 03, 2016. FINDINGS: Cardiovascular: Atherosclerosis of thoracic aorta is noted without aneurysm formation. Normal cardiac size. No pericardial effusion. Mild coronary artery calcifications are noted. Mediastinum/Nodes: No enlarged mediastinal or axillary lymph nodes. Thyroid gland, trachea, and esophagus demonstrate no significant findings. Lungs/Pleura: No pneumothorax is noted. Emphysematous disease is noted in both lungs. Complete consolidation of left lower lobe is noted most consistent with atelectasis with small amount of surrounding pleural fluid. Endobronchial obstruction can not be excluded. Right lower lobe pneumonia or atelectasis is noted. Small right pleural effusion is noted. Minimal subsegmental atelectasis is noted posteriorly in the right lower lobe which is decreased compared to prior exam. Upper Abdomen: No acute abnormality. Musculoskeletal: No chest wall mass or suspicious bone lesions identified. IMPRESSION: Complete consolidation of left lower lobe is noted consistent with atelectasis or possibly pneumonia, with small associated left pleural effusion; endobronchial obstruction can not be excluded. Mild right lower lobe opacity is noted concerning for pneumonia or atelectasis with small right pleural effusion. Mild coronary artery calcifications are noted. Aortic Atherosclerosis (ICD10-I70.0) and Emphysema (ICD10-J43.9). Electronically Signed   By: Marijo Conception, M.D.   On: 03/19/2019 10:17    ROS Blood pressure 124/67, pulse 87, temperature 98.1 F (36.7 C), temperature source Oral, resp. rate 19, height 5\' 11"  (1.803 m), weight 107.4 kg,  SpO2 92 %. Physical Exam Physical Examination: General appearance - obese,pale NAD Mental status - alert, oriented to person, place, and time Eyes - pupils equal and reactive, extraocular eye movements intact, funduscopic exam normal, discs flat and sharp Mouth - injuries in pharynx, black tongue Neck - adenopathy noted PCL Lymphatics - posterior cervical nodes Chest - rales noted bibasilar, decreased air entry noted bilat Heart - S1 and S2 normal, S4 present, systolic murmur HC6/2 at 2nd left intercostal space Abdomen - obese, pos bs, liver down 6cm Extremities - pedal edema 3 + Skin - moles, icthyotic changes in LE  Assessment/Plan: 1 CKD 3-4 with mild worsening.  Underlying injuries from AKI episode, and nephrosclerosis,  ??NSAIDS.  Better than on admit and may be close to new baseline.  Needs diuresis and eval of secondary effects of renal dz.  R/O infx/obstruction 2 Pneu resolving 3 Hypertension: bp too low, lower cardiazem 4. Anemia of ESRD: low grade, check Fe 5. Metabolic Bone Disease: check 6 Afib may need Amio] 7 COPD 8 Obesity P UA, Urine chem, bladder scans , lasix, PTH,  Fe.  Gevorg Brum 03/20/2019, 11:34 AM

## 2019-03-20 NOTE — Progress Notes (Signed)
Patient oxygen sat's continue to drop in the mid 80's throughout the night.  Patient is a mouth breather but refuses to change 02 device.  Will continue to monitor the patient and notify as needed

## 2019-03-20 NOTE — Progress Notes (Signed)
Physical Therapy Treatment Patient Details Name: David Barton MRN: [id number] DOB: 01-23-1933 Today's Date: 03/20/2019    History of Present Illness 85 year old man prior tobacco, psvt, systolic hf ef 00-17% with normalization to 50's osa not on therapy, with a 4-day history of increasing shortness of breath with productive cough.  Flu swab negative at his physician's office on 3/12.  On Tamiflu empirically.  Developed fever and rigors 3/13 and was increasingly short of breath and altered this morning prompting his wife to call EMS. Recovery complicated by afib with RVR; Prior history of pneumonia on background of COPD from remote smoking. No recent travel, no sick contacts.    PT Comments    Continuing work on functional mobility and activity tolerance;  Notable improvements in activity tolerance: not needing 10 L supplemental O2 for the entire walk, able to recover with standing rest breaks the first half of his walk, and singificantly improved gait distance   Follow Up Recommendations  CIR     Equipment Recommendations  Rolling walker with 5" wheels;3in1 (PT)    Recommendations for Other Services       Precautions / Restrictions Precautions Precautions: Fall Precaution Comments: watch O2 Sats and HR  Restrictions Weight Bearing Restrictions: No    Mobility  Bed Mobility                  Transfers Overall transfer level: Needs assistance Equipment used: Rolling walker (2 wheeled) Transfers: Sit to/from Stand Sit to Stand: Min assist         General transfer comment: Min assist to steady from recliner; good use of UEs to push up and control descent to sit  Ambulation/Gait Ambulation/Gait assistance: Min guard;+2 safety/equipment(helpful for a skilled pair of eyes to monitor vitals) Gait Distance (Feet): 40 Feet(x3; with 2 standing rest breaks and one seated rest break) Assistive device: Rolling walker (2 wheeled) Gait Pattern/deviations: Step-through  pattern Gait velocity: decreased   General Gait Details: Cues to self-monitor for activity tolerance; David Barton voiced that he did feel overall tired in conjunction with o2 sat dropping, and he requested to sit at appropriate times   Stairs             Wheelchair Mobility    Modified Rankin (Stroke Patients Only)       Balance     Sitting balance-Leahy Scale: Good       Standing balance-Leahy Scale: Fair                              Cognition Arousal/Alertness: Awake/alert Behavior During Therapy: WFL for tasks assessed/performed Overall Cognitive Status: Within Functional Limits for tasks assessed                                        Exercises      General Comments General comments (skin integrity, edema, etc.): At rest on 5 L supplemental O2 beginning of session; initially walked on 6 L, then had to bump up to 8L, then 10 L, RN aware; O2 sats got as low as 82% observed lowest, but first half of walk, he was able to recover with standing rest breaks, and he did not need 10 L the whole walk like he needed last session      Pertinent Vitals/Pain Pain Assessment: No/denies pain    Home Living  Prior Function            PT Goals (current goals can now be found in the care plan section) Acute Rehab PT Goals Patient Stated Goal: get stronger; so wants to walk in the hallway PT Goal Formulation: With patient Time For Goal Achievement: 03/23/19 Potential to Achieve Goals: Good Progress towards PT goals: Progressing toward goals    Frequency    Min 3X/week      PT Plan Current plan remains appropriate    Co-evaluation              AM-PAC PT "6 Clicks" Mobility   Outcome Measure  Help needed turning from your back to your side while in a flat bed without using bedrails?: A Little Help needed moving from lying on your back to sitting on the side of a flat bed without using bedrails?: A  Little Help needed moving to and from a bed to a chair (including a wheelchair)?: A Little Help needed standing up from a chair using your arms (e.g., wheelchair or bedside chair)?: A Little Help needed to walk in hospital room?: A Little Help needed climbing 3-5 steps with a railing? : A Lot 6 Click Score: 17    End of Session Equipment Utilized During Treatment: Oxygen Activity Tolerance: Patient tolerated treatment well(with rest breaks prn) Patient left: in chair;with call bell/phone within reach Nurse Communication: Mobility status PT Visit Diagnosis: Unsteadiness on feet (R26.81)     Time: 0349-6116 PT Time Calculation (min) (ACUTE ONLY): 23 min  Charges:  $Gait Training: 23-37 mins                     Roney Marion, [place]  Acute Rehabilitation Services Pager [phone] Office [phone]    Colletta [place] 03/20/2019, 11:57 AM

## 2019-03-21 ENCOUNTER — Inpatient Hospital Stay (HOSPITAL_COMMUNITY): Payer: Medicare Other

## 2019-03-21 LAB — URINALYSIS, ROUTINE W REFLEX MICROSCOPIC
BILIRUBIN URINE: NEGATIVE
Glucose, UA: NEGATIVE mg/dL
Ketones, ur: NEGATIVE mg/dL
Leukocytes,Ua: NEGATIVE
Nitrite: NEGATIVE
Protein, ur: NEGATIVE mg/dL
Specific Gravity, Urine: 1.009 (ref 1.005–1.030)
pH: 5 (ref 5.0–8.0)

## 2019-03-21 LAB — RENAL FUNCTION PANEL
Albumin: 2.7 g/dL — ABNORMAL LOW (ref 3.5–5.0)
Anion gap: 14 (ref 5–15)
BUN: 134 mg/dL — ABNORMAL HIGH (ref 8–23)
CO2: 32 mmol/L (ref 22–32)
Calcium: 9 mg/dL (ref 8.9–10.3)
Chloride: 97 mmol/L — ABNORMAL LOW (ref 98–111)
Creatinine, Ser: 2.72 mg/dL — ABNORMAL HIGH (ref 0.61–1.24)
GFR calc Af Amer: 24 mL/min — ABNORMAL LOW (ref >60)
GFR calc non Af Amer: 21 mL/min — ABNORMAL LOW (ref >60)
GLUCOSE: 128 mg/dL — AB (ref 70–99)
Phosphorus: 6.3 mg/dL — ABNORMAL HIGH (ref 2.5–4.6)
Potassium: 4.2 mmol/L (ref 3.5–5.1)
Sodium: 143 mmol/L (ref 135–145)

## 2019-03-21 LAB — SODIUM, URINE, RANDOM: Sodium, Ur: 75 mmol/L

## 2019-03-21 LAB — CREATININE, URINE, RANDOM: Creatinine, Urine: 27.38 mg/dL

## 2019-03-21 LAB — PARATHYROID HORMONE, INTACT (NO CA): PTH: 67 pg/mL — ABNORMAL HIGH (ref 15–65)

## 2019-03-21 MED ORDER — FUROSEMIDE 80 MG PO TABS
80.0000 mg | ORAL_TABLET | Freq: Two times a day (BID) | ORAL | Status: DC
  Administered 2019-03-21 – 2019-03-23 (×4): 80 mg via ORAL
  Filled 2019-03-21 (×4): qty 1

## 2019-03-21 MED ORDER — CALCITRIOL 0.25 MCG PO CAPS
0.2500 ug | ORAL_CAPSULE | Freq: Every day | ORAL | Status: DC
  Administered 2019-03-21 – 2019-03-26 (×6): 0.25 ug via ORAL
  Filled 2019-03-21 (×6): qty 1

## 2019-03-21 NOTE — Progress Notes (Addendum)
Progress Note  Patient Name: RIYANSH GERSTNER Date of Encounter: 03/21/2019  Primary Cardiologist: Quay Burow, MD   Subjective   No CP or dyspnea  Inpatient Medications    Scheduled Meds: . acetylcysteine  4 mL Nebulization BID  . amiodarone  400 mg Oral BID  . arformoterol  15 mcg Nebulization BID  . budesonide (PULMICORT) nebulizer solution  0.5 mg Nebulization BID  . diltiazem  240 mg Oral QHS  . docusate sodium  100 mg Oral BID  . feeding supplement (ENSURE ENLIVE)  237 mL Oral BID BM  . feeding supplement (PRO-STAT SUGAR FREE 64)  30 mL Oral TID WC  . furosemide  160 mg Oral BID  . ipratropium  0.5 mg Nebulization BID  . mouth rinse  15 mL Mouth Rinse BID  . rivaroxaban  15 mg Oral Q supper  . sodium chloride flush  3 mL Intravenous Q12H   Continuous Infusions: . sodium chloride     PRN Meds: alum & mag hydroxide-simeth, hydrocortisone, hydrocortisone cream, levalbuterol, lip balm, loratadine, Muscle Rub, phenol, polyethylene glycol, polyvinyl alcohol, senna-docusate, sodium chloride, sodium chloride flush   Vital Signs    Vitals:   03/21/19 0037 03/21/19 0623 03/21/19 0650 03/21/19 0846  BP:   134/69 136/64  Pulse:   68 82  Resp:   18 15  Temp: 97.8 F (36.6 C)   97.6 F (36.4 C)  TempSrc: Oral   Oral  SpO2:   91% 92%  Weight:  109.6 kg    Height:        Intake/Output Summary (Last 24 hours) at 03/21/2019 0921 Last data filed at 03/21/2019 0630 Gross per 24 hour  Intake -  Output 2550 ml  Net -2550 ml   Last 3 Weights 03/21/2019 03/20/2019 03/18/2019  Weight (lbs) 241 lb 10 oz 236 lb 12.4 oz 250 lb  Weight (kg) 109.6 kg 107.4 kg 113.4 kg      Telemetry    Atrial fibrillation rate controlled; intermittent bundle- Personally Reviewed   Physical Exam   GEN: WD WN NAD Neck: Supple, no JVD Cardiac: irregular Respiratory: mildly diminished BS bases GI: Soft, not tender or distended MS: 2+ edema Neuro:  No focal findings  Labs    Chemistry  Recent Labs  Lab 03/17/19 0317  03/19/19 0543 03/20/19 0431 03/21/19 0235  NA 141   < > 141 143 143  K 3.6   < > 4.5 4.7 4.2  CL 101   < > 96* 97* 97*  CO2 28   < > 33* 35* 32  GLUCOSE 212*   < > 168* 170* 128*  BUN 101*   < > 124* 124* 134*  CREATININE 2.64*   < > 2.51* 2.65* 2.72*  CALCIUM 8.4*   < > 8.9 8.8* 9.0  PROT 5.1*  --   --   --   --   ALBUMIN 2.3*  --   --   --  2.7*  AST 20  --   --   --   --   ALT 20  --   --   --   --   ALKPHOS 51  --   --   --   --   BILITOT 0.6  --   --   --   --   GFRNONAA 21*   < > 23* 21* 21*  GFRAA 25*   < > 26* 25* 24*  ANIONGAP 12   < > 12 11 14    < > =  values in this interval not displayed.     Hematology Recent Labs  Lab 03/16/19 0237 03/17/19 0317 03/18/19 0403  WBC 7.2 6.8 8.3  RBC 3.58* 3.64* 3.69*  HGB 11.4* 11.4* 11.7*  HCT 34.9* 35.4* 35.8*  MCV 97.5 97.3 97.0  MCH 31.8 31.3 31.7  MCHC 32.7 32.2 32.7  RDW 15.7* 15.8* 16.1*  PLT 262 241 224    BNP Recent Labs  Lab 03/20/19 0431  BNP 191.2*    Patient Profile     85 year old male with past medical history of diastolic congestive heart failure, supraventricular tachycardia, hypertension, colon cancer, COPD admitted with pneumonia who we are asked to evaluate for atrial fibrillation and acute on chronic diastolic congestive heart failure.  Patient admitted March 15 with pneumonia requiring intubation.  He has slowly improved.  He developed atrial fibrillation with rapid ventricular response and has been treated with Cardizem.  He developed worsening edema requiring diuresis and subsequently progressive renal insuff.  Cardiology asked to evaluate.   Echocardiogram shows normal LV function. S/p TEE DCCV 3/27. Reverted to atrial fibrillation.   Assessment & Plan    1 new onset atrial fibrillation-patient remains in atrial fibrillation today.  Rate is controlled.  As outlined in previous note he underwent successful TEE guided cardioversion previously but did not hold  sinus rhythm.  Atrial fibrillation may be contributing to diastolic congestive heart failure.  Continue Cardizem but decreased to 120 mg daily.  Continue amiodarone load.  Continue Xarelto.  Plan repeat cardioversion tomorrow.  Hopefully he will hold sinus rhythm which would improve congestive heart failure symptoms.  2 acute on chronic diastolic congestive heart failure-I/O -4855.  Volume status improving.  Lasix decreased by nephrology to 80 mg twice daily today.  Hopefully if we can reestablish sinus rhythm his volume status and renal function will improve.    3 acute on chronic stage III kidney disease-Nephrology following and managing diuretics.  4 status post pneumonia-Per primary care.  For questions or updates, please contact [place] Please consult [email or web address] for contact info under        Signed, Kirk Ruths, MD  03/21/2019, 9:21 AM

## 2019-03-21 NOTE — Progress Notes (Signed)
Occupational Therapy Treatment Patient Details Name: David Barton MRN: [id number] DOB: 1933-02-01 Today's Date: 03/21/2019    History of present illness 85 year old man prior tobacco, psvt, systolic hf ef 27-06% with normalization to 50's osa not on therapy, with a 4-day history of increasing shortness of breath with productive cough.  Flu swab negative at his physician's office on 3/12.  On Tamiflu empirically.  Developed fever and rigors 3/13 and was increasingly short of breath and altered this morning prompting his wife to call EMS. Recovery complicated by afib with RVR; Prior history of pneumonia on background of COPD from remote smoking. No recent travel, no sick contacts.   OT comments  Pt agreeable to therapy and found on 7L O2, but required 10L O2 HFNC. Pt performing sit to stand with minguardA and taxing effort to mobilize with minguardA for stability with RW to sink for 5 mins and had to be seated for task. Pt performing light grooming seated in chair and returned to bed for modified independent bed mobility. Pt's SpO2 levels >90% on 7L HFNC after exertion. No further ADLs ablet o be performed per pt. Pt would benefit from continued OT skilled services for ADL, mobility and safety. OT to follow acutely.     Follow Up Recommendations  CIR    Equipment Recommendations  None recommended by OT    Recommendations for Other Services Rehab consult    Precautions / Restrictions Precautions Precautions: Fall Precaution Comments: watch O2 Sats and HR        Mobility Bed Mobility Overal bed mobility: Modified Independent Bed Mobility: Sit to Supine       Sit to supine: Modified independent (Device/Increase time)   General bed mobility comments: OOB in chair   Transfers Overall transfer level: Needs assistance Equipment used: Rolling walker (2 wheeled) Transfers: Sit to/from Stand Sit to Stand: Min assist         General transfer comment: Min assist to steady from  recliner; good use of UEs to push up and control descent to sit    Balance Overall balance assessment: Needs assistance Sitting-balance support: Feet supported Sitting balance-Leahy Scale: Good       Standing balance-Leahy Scale: Fair                             ADL either performed or assessed with clinical judgement   ADL Overall ADL's : Needs assistance/impaired     Grooming: Wash/dry hands;Oral care;Brushing hair;Set up;Sitting                               Functional mobility during ADLs: Min guard;Rolling walker General ADL Comments: Pt satting at 89-91% on 10% O2 HFNC;Pt sitting down for ADL to increase activity tolerance through energy conservation     Vision   Vision Assessment?: No apparent visual deficits   Perception     Praxis      Cognition Arousal/Alertness: Awake/alert Behavior During Therapy: WFL for tasks assessed/performed Overall Cognitive Status: Within Functional Limits for tasks assessed                                          Exercises     Shoulder Instructions       General Comments Pt tolerating O2 saturation 88-91% on 10 L HFNC.  pt increased O2 through pursed lip breathing and O2 sats 7L O2 to return to bed and perform bed mobility >88%. upon leaving after a few mins of rest, pt able to scoot self towards HOB and increase sats to 93% on 7L HFNC.    Pertinent Vitals/ Pain       Pain Assessment: No/denies pain  Home Living                                          Prior Functioning/Environment              Frequency  Min 3X/week        Progress Toward Goals  OT Goals(current goals can now be found in the care plan section)  Progress towards OT goals: Progressing toward goals  Acute Rehab OT Goals Patient Stated Goal: get stronger; so wants to walk in the hallway OT Goal Formulation: With patient Time For Goal Achievement: 03/29/19 Potential to Achieve Goals:  Good ADL Goals Pt Will Perform Lower Body Bathing: with modified independence;sit to/from stand Pt Will Transfer to Toilet: with modified independence;ambulating Pt Will Perform Tub/Shower Transfer: with modified independence;ambulating Additional ADL Goal #1: Pt will independently verbalize 3 energy conservation strategies Additional ADL Goal #2: Pt will independently verbalize 3 strategies to prevent falls  Plan Discharge plan remains appropriate    Co-evaluation                 AM-PAC OT "6 Clicks" Daily Activity     Outcome Measure   Help from another person eating meals?: None Help from another person taking care of personal grooming?: None Help from another person toileting, which includes using toliet, bedpan, or urinal?: A Little Help from another person bathing (including washing, rinsing, drying)?: A Little Help from another person to put on and taking off regular upper body clothing?: A Little Help from another person to put on and taking off regular lower body clothing?: A Little 6 Click Score: 20    End of Session Equipment Utilized During Treatment: Oxygen;Rolling walker  OT Visit Diagnosis: Unsteadiness on feet (R26.81);Repeated falls (R29.6)   Activity Tolerance Patient tolerated treatment well   Patient Left in bed;with call bell/phone within reach   Nurse Communication Mobility status        Time: 9977-4142 OT Time Calculation (min): 23 min  Charges: OT General Charges $OT Visit: 1 Visit OT Treatments $Therapeutic Activity: 23-37 mins  Ebony Hail Harold Hedge) Marsa Aris OTR/L Acute Rehabilitation Services Pager: [phone] Office: [phone]    Fredda Hammed 03/21/2019, 3:04 PM

## 2019-03-21 NOTE — Progress Notes (Signed)
Patient stated his condom cath came off.  When his genital area was assessed.   Trauma and bleeding noted to the penis. Severe  MASD noted to the groin area.  Informed the patient that the condom cath shouldn't be replaced due to chance of increase trauma to the penis.  Patient stated he's incontinent and cannot use the urinal at bedside or make it to the bedside commode.  Patient stated "I'll rather the condom cath to be replaced then to mess up the bed throughout the night".. Applied the condom cath.  Will continue to monitor the patient and notify as needed

## 2019-03-21 NOTE — Progress Notes (Signed)
Subjective: Interval History: has no complaint , feels better with less fluid..  Objective: Vital signs in last 24 hours: Temp:  [97.6 F (36.4 C)-97.8 F (36.6 C)] 97.6 F (36.4 C) (04/01 0846) Pulse Rate:  [51-83] 82 (04/01 0846) Resp:  [15-22] 15 (04/01 0846) BP: (134-149)/(64-80) 136/64 (04/01 0846) SpO2:  [90 %-94 %] 94 % (04/01 1010) Weight:  [109.6 kg] 109.6 kg (04/01 0623) Weight change: 2.2 kg  Intake/Output from previous day: 03/31 0701 - 04/01 0700 In: -  Out: 2550 [Urine:2550] Intake/Output this shift: Total I/O In: -  Out: 450 [Urine:450]  General appearance: alert, cooperative, morbidly obese and pale Resp: diminished breath sounds bilaterally and rales bibasilar Cardio: regularly irregular rhythm and S1, S2 normal GI: obese, pos bs soft,  Extremities: edema 3+  Lab Results: No results for input(s): WBC, HGB, HCT, PLT in the last 72 hours. BMET:  Recent Labs    03/20/19 0431 03/21/19 0235  NA 143 143  K 4.7 4.2  CL 97* 97*  CO2 35* 32  GLUCOSE 170* 128*  BUN 124* 134*  CREATININE 2.65* 2.72*  CALCIUM 8.8* 9.0   Recent Labs    03/20/19 1354  PTH 67*   Iron Studies:  Recent Labs    03/20/19 1354  IRON 132  TIBC 234*    Studies/Results: Dg Chest 2 View  Result Date: 03/21/2019 CLINICAL DATA:  Shortness of breath EXAM: CHEST - 2 VIEW COMPARISON:  03/14/2029, CT from 03/19/2019 FINDINGS: Cardiac shadow is within normal limits. Persistent left lower lobe consolidation is noted stable from the prior CT examination. No other significant infiltrate is seen. No significant vascular congestion is noted. No bony abnormality is seen. IMPRESSION: Persistent left lower lobe consolidation. Electronically Signed   By: Inez Catalina M.D.   On: 03/21/2019 08:34    I have reviewed the patient's current medications.  Assessment/Plan: 1 CKD 4 diureisng, mild ^Cr/BUN, will cont at lower dose Lasix 2 Anemia stable 3 HPTH give Vit D 4 DM controlled 5 Afib  per cards 6 Obesity 7 COPD P lower Lasix, K, diet , vit D, Cardiovert    LOS: 18 days   Kimara Bencomo 03/21/2019,10:13 AM

## 2019-03-21 NOTE — Progress Notes (Signed)
PROGRESS NOTE    David Barton  TGG:[id number] DOB: 04-Jan-1933 DOA: 03/03/2019 PCP: Maury Dus, MD    Brief Narrative:  85 year old gentleman with history of COPD, emphysema, PSVT, essential hypertension, systolic heart failure with known ejection fraction of 30%, alcohol abuse, history of colon cancer who was admitted to the intensive care unit on 03/04/2019 with 4 days history of increasing shortness of breath, productive cough.  He was on Tamiflu empirically.  Patient was transferred to intensive care unit from [facility] on ventilator and treated as pneumonia and septic shock.  He has ultimately improved but remains on high flow oxygen.  Currently on medical floor.  Has persistent A. fib, underwent TEE and cardioversion with transient sinus rhythm now back to A. fib.  COVID-19 ruled out.  He has finished his antibiotic therapy.  Developed rapid A. fib new onset in the hospital.   Subjective:  Patient in bed, appears comfortable, denies any headache, no fever, no chest pain or pressure, +ve shortness of breath , no abdominal pain. No focal weakness.  Assessment & Plan:   Acute hypoxic respiratory failure secondary to chronic diastolic CHF EF preserved at 60% due to A. fib RVR due to Afib RVR: Initially suspicion was for community-acquired pneumonia and he has finished antibiotic course in ICU for tach, he was also checked for COVID-19 and was negative.  He has been aggressively diuresed and now BUN and creatinine has shot up although he still has third spacing and edema, some element of hypoalbuminemia causing third spacing of fluid and relative intravascular depletion. Renal on board and managing diuretics and fluids.  Cardiology following, renal has been requested to evaluate as well to direct fluid management.  Chest CT on 03/19/2019 noted and reviewed with pulmonary, left lower lobe collapse/consolidation.  Have added Mucomyst, chest PT and percussion along with deep  suctioning.  Repeat chest x-ray on 03/21/2019 shows continued collapse/consolidation.  Continue chest PT another day if lung collapse does not improve he may require bronchoscopy.  Question if there is an underlying bronchial mass.    New onset A. fib with RVR [place] vas 2 score of at least 3: Echo noted, TSH stable, on Cardizem and Xarelto continue.  Case discussed with cardiologist again on 03/20/2019 plan is to cardiovert to achieve sinus rhythm again likely April 2.  COPD no wheezing.  No exacerbation: supportive care stop steroids.  Acute kidney injury with history of chronic kidney disease stage 4: His baseline creatinine 1.7, for now diuretics held, nephrology consulted.  Generalized deconditioning: We will continue to work with PT OT.  He has done some improvement now.      DVT prophylaxis: Xarelto. Code Status: DNR Family Communication: No family at bedside. Disposition Plan: Home versus acute rehab.   Consultants:   PCCM, signed off  Cardiology for A. Fib.  Nephrology  Procedures:   TEE with cardioversion.  Antimicrobials:   Rocephin and azithromycin, finished antibiotic therapy.  Objective: Vitals:   03/21/19 0650 03/21/19 0846 03/21/19 1007 03/21/19 1010  BP: 134/69 136/64    Pulse: 68 82    Resp: 18 15    Temp:  97.6 F (36.4 C)    TempSrc:  Oral    SpO2: 91% 92% 94% 94%  Weight:      Height:        Intake/Output Summary (Last 24 hours) at 03/21/2019 1012 Last data filed at 03/21/2019 0941 Gross per 24 hour  Intake -  Output 3000 ml  Net -3000 ml   Filed Weights   03/18/19 0614 03/20/19 0500 03/21/19 0623  Weight: 113.4 kg 107.4 kg 109.6 kg    Examination:  Awake Alert, Oriented X 3, No new F.N deficits, Normal affect Helper.AT,PERRAL Supple Neck,No JVD, No cervical lymphadenopathy appriciated.  Symmetrical Chest wall movement, reduced left lower lobe breath sounds RRR,No Gallops, Rubs or new Murmurs, No Parasternal Heave +ve B.Sounds, Abd Soft, No  tenderness, No organomegaly appriciated, No rebound - guarding or rigidity. No Cyanosis, Clubbing, +ve edema, No new Rash or bruise    Data Reviewed: I have personally reviewed following labs and imaging studies  CBC: Recent Labs  Lab 03/15/19 0359 03/16/19 0237 03/17/19 0317 03/18/19 0403  WBC 6.5 7.2 6.8 8.3  HGB 11.6* 11.4* 11.4* 11.7*  HCT 36.6* 34.9* 35.4* 35.8*  MCV 99.2 97.5 97.3 97.0  PLT 275 262 241 623   Basic Metabolic Panel: Recent Labs  Lab 03/16/19 0237  03/17/19 0317 03/18/19 0403 03/19/19 0543 03/20/19 0431 03/21/19 0235  NA 141   < > 141 140 141 143 143  K 3.2*   < > 3.6 4.3 4.5 4.7 4.2  CL 102   < > 101 96* 96* 97* 97*  CO2 28   < > 28 30 33* 35* 32  GLUCOSE 202*   < > 212* 181* 168* 170* 128*  BUN 91*   < > 101* 110* 124* 124* 134*  CREATININE 2.64*   < > 2.64* 2.41* 2.51* 2.65* 2.72*  CALCIUM 8.3*   < > 8.4* 8.7* 8.9 8.8* 9.0  MG 1.7  --  2.1 2.0 1.9 1.9  --   PHOS  --   --   --   --   --   --  6.3*   < > = values in this interval not displayed.   GFR: Estimated Creatinine Clearance: 25.9 mL/min (A) (by C-G formula based on SCr of 2.72 mg/dL (H)). Liver Function Tests: Recent Labs  Lab 03/17/19 0317 03/21/19 0235  AST 20  --   ALT 20  --   ALKPHOS 51  --   BILITOT 0.6  --   PROT 5.1*  --   ALBUMIN 2.3* 2.7*   No results for input(s): LIPASE, AMYLASE in the last 168 hours. No results for input(s): AMMONIA in the last 168 hours. Coagulation Profile: Recent Labs  Lab 03/16/19 0757  INR 2.3*   Cardiac Enzymes: No results for input(s): CKTOTAL, CKMB, CKMBINDEX, TROPONINI in the last 168 hours. BNP (last 3 results) No results for input(s): PROBNP in the last 8760 hours. HbA1C: No results for input(s): HGBA1C in the last 72 hours. CBG: No results for input(s): GLUCAP in the last 168 hours. Lipid Profile: No results for input(s): CHOL, HDL, LDLCALC, TRIG, CHOLHDL, LDLDIRECT in the last 72 hours. Thyroid Function Tests: No results  for input(s): TSH, T4TOTAL, FREET4, T3FREE, THYROIDAB in the last 72 hours. Anemia Panel: Recent Labs    03/20/19 1354  TIBC 234*  IRON 132   Sepsis Labs: No results for input(s): PROCALCITON, LATICACIDVEN in the last 168 hours.  No results found for this or any previous visit (from the past 240 hour(s)).   Radiology Studies: Dg Chest 2 View  Result Date: 03/21/2019 CLINICAL DATA:  Shortness of breath EXAM: CHEST - 2 VIEW COMPARISON:  03/14/2029, CT from 03/19/2019 FINDINGS: Cardiac shadow is within normal limits. Persistent left lower lobe consolidation is noted stable from the prior CT examination. No other significant infiltrate is seen.  No significant vascular congestion is noted. No bony abnormality is seen. IMPRESSION: Persistent left lower lobe consolidation. Electronically Signed   By: Inez Catalina M.D.   On: 03/21/2019 08:34    Scheduled Meds: . acetylcysteine  4 mL Nebulization BID  . amiodarone  400 mg Oral BID  . arformoterol  15 mcg Nebulization BID  . budesonide (PULMICORT) nebulizer solution  0.5 mg Nebulization BID  . calcitRIOL  0.25 mcg Oral Daily  . diltiazem  240 mg Oral QHS  . docusate sodium  100 mg Oral BID  . feeding supplement (ENSURE ENLIVE)  237 mL Oral BID BM  . feeding supplement (PRO-STAT SUGAR FREE 64)  30 mL Oral TID WC  . furosemide  80 mg Oral BID  . ipratropium  0.5 mg Nebulization BID  . mouth rinse  15 mL Mouth Rinse BID  . rivaroxaban  15 mg Oral Q supper  . sodium chloride flush  3 mL Intravenous Q12H   Continuous Infusions: . sodium chloride       LOS: 18 days    Time spent: 25 minutes   Signature  Lala Lund M.D on 03/21/2019 at 10:12 AM   -  To page go to [email or web address]

## 2019-03-22 LAB — RENAL FUNCTION PANEL
Albumin: 2.6 g/dL — ABNORMAL LOW (ref 3.5–5.0)
Anion gap: 14 (ref 5–15)
BUN: 141 mg/dL — ABNORMAL HIGH (ref 8–23)
CO2: 32 mmol/L (ref 22–32)
Calcium: 8.5 mg/dL — ABNORMAL LOW (ref 8.9–10.3)
Chloride: 94 mmol/L — ABNORMAL LOW (ref 98–111)
Creatinine, Ser: 2.79 mg/dL — ABNORMAL HIGH (ref 0.61–1.24)
GFR calc Af Amer: 23 mL/min — ABNORMAL LOW (ref >60)
GFR calc non Af Amer: 20 mL/min — ABNORMAL LOW (ref >60)
Glucose, Bld: 99 mg/dL (ref 70–99)
Phosphorus: 6.3 mg/dL — ABNORMAL HIGH (ref 2.5–4.6)
Potassium: 3.6 mmol/L (ref 3.5–5.1)
Sodium: 140 mmol/L (ref 135–145)

## 2019-03-22 MED ORDER — SODIUM CHLORIDE 0.9% FLUSH
3.0000 mL | Freq: Two times a day (BID) | INTRAVENOUS | Status: DC
  Administered 2019-03-22 – 2019-03-26 (×8): 3 mL via INTRAVENOUS

## 2019-03-22 MED ORDER — ACETYLCYSTEINE 20 % IN SOLN
4.0000 mL | Freq: Two times a day (BID) | RESPIRATORY_TRACT | Status: AC
  Administered 2019-03-22 – 2019-03-23 (×2): 4 mL via RESPIRATORY_TRACT
  Filled 2019-03-22 (×4): qty 4

## 2019-03-22 MED ORDER — SODIUM CHLORIDE 0.9% FLUSH: 3.0000 mL | INTRAVENOUS | Status: DC | PRN

## 2019-03-22 MED ORDER — DILTIAZEM HCL ER COATED BEADS 120 MG PO CP24
120.0000 mg | ORAL_CAPSULE | Freq: Every day | ORAL | Status: DC
  Administered 2019-03-22 – 2019-03-25 (×4): 120 mg via ORAL
  Filled 2019-03-22 (×4): qty 1

## 2019-03-22 MED ORDER — SODIUM CHLORIDE 0.9 % IV SOLN: 250.0000 mL | INTRAVENOUS | Status: DC

## 2019-03-22 NOTE — Progress Notes (Signed)
Subjective: Interval History: has no complaint , feels better, breathing better..  Objective: Vital signs in last 24 hours: Temp:  [97.5 F (36.4 C)-98 F (36.7 C)] 97.9 F (36.6 C) (04/02 0811) Pulse Rate:  [78-87] 87 (04/02 0811) Resp:  [20-25] 25 (04/02 0811) BP: (115-152)/(66-83) 137/83 (04/02 0811) SpO2:  [90 %-94 %] 92 % (04/02 0922) Weight:  [105.8 kg] 105.8 kg (04/02 0500) Weight change: -3.821 kg  Intake/Output from previous day: 04/01 0701 - 04/02 0700 In: 120 [P.O.:120] Out: 4975 [Urine:4975] Intake/Output this shift: No intake/output data recorded.  General appearance: alert, cooperative, no distress, morbidly obese and pale Resp: rales bibasilar Cardio: irregularly irregular rhythm and systolic murmur: systolic ejection 2/6, crescendo and decrescendo at 2nd left intercostal space GI: obese, pos bs Extremities: edema 2+  Lab Results: No results for input(s): WBC, HGB, HCT, PLT in the last 72 hours. BMET:  Recent Labs    03/21/19 0235 03/22/19 0350  NA 143 140  K 4.2 3.6  CL 97* 94*  CO2 32 32  GLUCOSE 128* 99  BUN 134* 141*  CREATININE 2.72* 2.79*  CALCIUM 9.0 8.5*   Recent Labs    03/20/19 1354  PTH 67*   Iron Studies:  Recent Labs    03/20/19 1354  IRON 132  TIBC 234*    Studies/Results: Dg Chest 2 View  Result Date: 03/21/2019 CLINICAL DATA:  Shortness of breath EXAM: CHEST - 2 VIEW COMPARISON:  03/14/2029, CT from 03/19/2019 FINDINGS: Cardiac shadow is within normal limits. Persistent left lower lobe consolidation is noted stable from the prior CT examination. No other significant infiltrate is seen. No significant vascular congestion is noted. No bony abnormality is seen. IMPRESSION: Persistent left lower lobe consolidation. Electronically Signed   By: Inez Catalina M.D.   On: 03/21/2019 08:34    I have reviewed the patient's current medications.  Assessment/Plan: 1 CKD4 I expect this is close to his new baseline, may have some slow  improvement. Have lowered Lasix, still diuresing. 2 Afib per cards 3 DM controlled 4 Resp failure resolving 5 Obesith P current Lasix, follow vol ,  Cr, diet    LOS: 19 days   Jeneen Rinks Izumi Mixon 03/22/2019,9:44 AM

## 2019-03-22 NOTE — Progress Notes (Signed)
RD working remotely  Nutrition Follow Up  DOCUMENTATION CODES:   Obesity unspecified  INTERVENTION:    Ensure Enlive po BID, each supplement provides 350 kcal and 20 grams of protein  NUTRITION DIAGNOSIS:   Inadequate oral intake related to poor appetite as evidenced by per patient/family report, resolved  GOAL:   Patient will meet greater than or equal to 90% of their needs, met  MONITOR:   PO intake, Supplement acceptance, Labs, Weight trends  ASSESSMENT:   85 yo male admitted with acute respiratory failure related to CAP requiring brief intubation, septic shock, acute encephalopathy, ARF. PMH includes COPD, HTN, EtOH abuse, colon cancer, former tobacco abuse  3/15 extubated, TF discontinued via OGT 3/16 advanced to Dys 1-thin liquid diet 3/18 advanced to Soft diet 3/19 diet liberalized to Regular 3/19 transferred out of [id number]   PO intake 100% per flowsheet records. Drinking some of his Ensure Enlive supplements. Labs & medications reviewed.  Disp: home vs IP Rehab.  Diet Order:  Regular  EDUCATION NEEDS:   Not appropriate for education at this time  Skin:  Skin Assessment: Reviewed RN Assessment  Last BM:  4/1  Height:   Ht Readings from Last 1 Encounters:  03/03/19 '5\' 11"'$  (1.803 m)    Weight:   Wt Readings from Last 1 Encounters:  03/22/19 105.8 kg    Ideal Body Weight:  78.2 kg  BMI:  Body mass index is 32.52 kg/m.  Estimated Nutritional Needs:   Kcal:  1850-2050  Protein:  95-110 gm  Fluid:  1.8-2.0 L  Arthur Holms, RD, LDN Pager #: [phone] After-Hours Pager #: [phone]

## 2019-03-22 NOTE — Progress Notes (Signed)
Inpatient Rehabilitation-Admissions Coordinator   Met with pt at the bedside. Pt remains interested in CIR. AC will continue to follow for medical readiness. Noted plans for possible cardioversion.   Jhonnie Garner, OTR/L  Rehab Admissions Coordinator  [phone] 03/22/2019 11:12 AM

## 2019-03-22 NOTE — Progress Notes (Signed)
PT Cancellation Note  Patient Details Name: David Barton MRN: [id number] DOB: 04-Apr-1933   Cancelled Treatment:     Pt reports he just got back into bed 10-15 min ago after being in chair for a long time. Feels tired and wants to rest prior to procedure tomorrow. Told him he can call rehab office in next hour or so if he changes his mind and wants to get up to walk.    Selinda Eon PT, DPT Acute Rehab [phone]

## 2019-03-22 NOTE — Progress Notes (Signed)
ANTICOAGULATION CONSULT NOTE - Pharmacy Consult for Xarelto Indication: atrial fibrillation, non valvular  Allergies  Allergen Reactions  . Flexeril [Cyclobenzaprine] Other (See Comments)    Unknown reaction     Patient Measurements: Height: 5\' 11"  (180.3 cm) Weight: 233 lb 3.2 oz (105.8 kg) IBW/kg (Calculated) : 75.3  Vital Signs: Temp: 97.9 F (36.6 C) (04/02 0811) Temp Source: Oral (04/02 0811) BP: 137/83 (04/02 0811) Pulse Rate: 87 (04/02 0811)  Labs: Recent Labs    03/20/19 0431 03/21/19 0235 03/22/19 0350  CREATININE 2.65* 2.72* 2.79*    Estimated Creatinine Clearance: 24.8 mL/min (A) (by C-G formula based on SCr of 2.79 mg/dL (H)).   Medical History: Past Medical History:  Diagnosis Date  . Alcohol abuse   . Colon cancer ([place])   . COPD (chronic obstructive pulmonary disease) ([place])   . Emphysema of lung ([place])   . Former tobacco use   . Hypertension   . Peripheral edema   . Sepsis ([place]) 10/2016   Aspiration PNA/C diff colitis    Medications:  Medications Prior to Admission  Medication Sig Dispense Refill Last Dose  . acetaminophen (TYLENOL) 500 MG tablet Take 1,000 mg by mouth every 6 (six) hours as needed for fever.   03/02/2019 at Unknown time  . allopurinol (ZYLOPRIM) 100 MG tablet Take 100 mg by mouth daily.   03/02/2019 at am  . aspirin EC 81 MG tablet Take 81 mg by mouth daily.    03/02/2019 at am  . bisoprolol (ZEBETA) 5 MG tablet Take 5 mg by mouth daily.   03/02/2019 at 11a-12p  . ferrous sulfate 325 (65 FE) MG tablet Take 325 mg by mouth daily.    03/02/2019 at am  . furosemide (LASIX) 20 MG tablet Take 1 tablet (20 mg total) by mouth daily. 90 tablet 3 03/02/2019 at am  . HYDROcodone-homatropine (HYDROMET) 5-1.5 MG/5ML syrup Take 5 mLs by mouth every 6 (six) hours as needed for cough.   03/02/2019 at Unknown time  . Multiple Vitamin (MULTIVITAMIN WITH MINERALS) TABS tablet Take 1 tablet by mouth daily.   03/02/2019 at am  . oseltamivir (TAMIFLU) 75  MG capsule Take 75 mg by mouth 2 (two) times daily. 5 day course started 03/01/19 pm   03/02/2019 at pm  . predniSONE (DELTASONE) 10 MG tablet Take 10 mg by mouth daily.   03/02/2019 at am  . tiotropium (SPIRIVA) 18 MCG inhalation capsule Place 1 capsule (18 mcg total) into inhaler and inhale daily. 30 capsule 12 03/02/2019 at am    Assessment: 76 YOM who presented 03/03/19 with SOB found to have new AFib with CHADSVASc score 4. Pharmacy initially  consulted 03/07/19  to start rivaroxaban for stroke prevention, nonvalular afib. Xarelto 15mg  daily started 03/07/19, dose adjusted for renal dysfuction , SCr was 2.1.  AKI on CKD stage IV, SCr has steadily increased, SCr 2.79 today.  Renal MD following and notes this may be patient's new baseline SCr.  CrCl is 30 ml/min (using TBW for Xarelto dosing).  Xarelto 15 mg daily is still appropriate dose for CrCl 15-50 ml/min for nonvalvular atrial fibrillation.   Hgb 11.7, PLTs WNL (3/29), no bleeding noted. Cardiology planning repeat cardioversion ?Fri 4/3  Goal of Therapy:  Primary stroke prevention Monitor platelets by anticoagulation protocol: Yes   Plan:  Continue Xarelto15 mg daily Monitor for renal function changes and for s/s of bleeding  Thank you for allowing pharmacy to be a part of this patient's care. Nicole Cella, RPh Clinical  Pharmacist Pager: [phone] Please check AMION for all [place] phone numbers After 10:00 PM, call [place] [phone] 03/22/2019 11:32 AM

## 2019-03-22 NOTE — Progress Notes (Signed)
PROGRESS NOTE    David Barton  BJS:[id number] DOB: August 18, 1933 DOA: 03/03/2019 PCP: Maury Dus, MD    Brief Narrative:  86 year old gentleman with history of COPD, emphysema, PSVT, essential hypertension, systolic heart failure with known ejection fraction of 30%, alcohol abuse, history of colon cancer who was admitted to the intensive care unit on 03/04/2019 with 4 days history of increasing shortness of breath, productive cough.  He was on Tamiflu empirically.  Patient was transferred to intensive care unit from [facility] on ventilator and treated as pneumonia and septic shock.  He has ultimately improved but remains on high flow oxygen.  Currently on medical floor.  Has persistent A. fib, underwent TEE and cardioversion with transient sinus rhythm now back to A. fib.  COVID-19 ruled out.  He has finished his antibiotic therapy.  Developed rapid A. fib new onset in the hospital.   Subjective:  Patient in bed, appears comfortable, denies any headache, no fever, no chest pain or pressure, +ve shortness of breath , no abdominal pain. No focal weakness.   Assessment & Plan:   Acute hypoxic respiratory failure secondary to chronic diastolic CHF EF preserved at 60% due to A. fib RVR due to Afib RVR: Initially suspicion was for community-acquired pneumonia and he has finished antibiotic course in ICU for tach, he was also checked for COVID-19 and was negative.  He has been aggressively diuresed and now BUN and creatinine has shot up although he still has third spacing and edema, some element of hypoalbuminemia causing third spacing of fluid and relative intravascular depletion. Renal on board and managing diuretics and fluids.  Cardiology following, renal has been requested to evaluate as well to direct fluid management.  Chest CT on 03/19/2019 noted and reviewed with pulmonary, left lower lobe collapse/consolidation.  Have added Mucomyst, chest PT and percussion along with deep  suctioning.  Repeat chest x-ray on 03/21/2019 shows continued collapse/consolidation.  Continue chest PT another day if lung collapse does not improve he may require bronchoscopy.  Question if there is an underlying bronchial mass.    New onset A. fib with RVR [place] vas 2 score of at least 3: Echo noted, TSH stable, on Cardizem and Xarelto continue.  Case discussed with cardiologist again on 03/20/2019 plan is to cardiovert him again in order to obtain sinus rhythm.  COPD no wheezing.  No exacerbation: supportive care stop steroids.  Acute kidney injury with history of chronic kidney disease stage 4: His baseline creatinine 1.7, for now diuretics held, nephrology consulted.  Generalized deconditioning: We will continue to work with PT OT.  He has done some improvement now.      DVT prophylaxis: Xarelto. Code Status: DNR Family Communication: No family at bedside. Disposition Plan: Home versus acute rehab.   Consultants:   PCCM, signed off  Cardiology for A. Fib.  Nephrology  Procedures:   TEE with cardioversion.  Antimicrobials:   Rocephin and azithromycin, finished antibiotic therapy.  Objective: Vitals:   03/21/19 2144 03/22/19 0500 03/22/19 0539 03/22/19 0811  BP: 140/68  115/77 137/83  Pulse: 86  84 87  Resp: (!) 23  20 (!) 25  Temp: 98 F (36.7 C)  (!) 97.5 F (36.4 C) 97.9 F (36.6 C)  TempSrc: Oral  Oral Oral  SpO2: 90%  92% 92%  Weight:  105.8 kg    Height:        Intake/Output Summary (Last 24 hours) at 03/22/2019 0845 Last data filed at 03/22/2019 0544 Gross  per 24 hour  Intake 120 ml  Output 4975 ml  Net -4855 ml   Filed Weights   03/20/19 0500 03/21/19 0623 03/22/19 0500  Weight: 107.4 kg 109.6 kg 105.8 kg    Examination:  Awake Alert, Oriented X 3, No new F.N deficits, Normal affect .AT,PERRAL Supple Neck,No JVD, No cervical lymphadenopathy appriciated.  Symmetrical Chest wall movement, reduced left lower lobe breath sounds RRR,No Gallops,  Rubs or new Murmurs, No Parasternal Heave +ve B.Sounds, Abd Soft, No tenderness, No organomegaly appriciated, No rebound - guarding or rigidity. No Cyanosis, Clubbing, +ve edema, No new Rash or bruise    Data Reviewed: I have personally reviewed following labs and imaging studies  CBC: Recent Labs  Lab 03/16/19 0237 03/17/19 0317 03/18/19 0403  WBC 7.2 6.8 8.3  HGB 11.4* 11.4* 11.7*  HCT 34.9* 35.4* 35.8*  MCV 97.5 97.3 97.0  PLT 262 241 443   Basic Metabolic Panel: Recent Labs  Lab 03/16/19 0237  03/17/19 0317 03/18/19 0403 03/19/19 0543 03/20/19 0431 03/21/19 0235 03/22/19 0350  NA 141   < > 141 140 141 143 143 140  K 3.2*   < > 3.6 4.3 4.5 4.7 4.2 3.6  CL 102   < > 101 96* 96* 97* 97* 94*  CO2 28   < > 28 30 33* 35* 32 32  GLUCOSE 202*   < > 212* 181* 168* 170* 128* 99  BUN 91*   < > 101* 110* 124* 124* 134* 141*  CREATININE 2.64*   < > 2.64* 2.41* 2.51* 2.65* 2.72* 2.79*  CALCIUM 8.3*   < > 8.4* 8.7* 8.9 8.8* 9.0 8.5*  MG 1.7  --  2.1 2.0 1.9 1.9  --   --   PHOS  --   --   --   --   --   --  6.3* 6.3*   < > = values in this interval not displayed.   GFR: Estimated Creatinine Clearance: 24.8 mL/min (A) (by C-G formula based on SCr of 2.79 mg/dL (H)). Liver Function Tests: Recent Labs  Lab 03/17/19 0317 03/21/19 0235 03/22/19 0350  AST 20  --   --   ALT 20  --   --   ALKPHOS 51  --   --   BILITOT 0.6  --   --   PROT 5.1*  --   --   ALBUMIN 2.3* 2.7* 2.6*   No results for input(s): LIPASE, AMYLASE in the last 168 hours. No results for input(s): AMMONIA in the last 168 hours. Coagulation Profile: Recent Labs  Lab 03/16/19 0757  INR 2.3*   Cardiac Enzymes: No results for input(s): CKTOTAL, CKMB, CKMBINDEX, TROPONINI in the last 168 hours. BNP (last 3 results) No results for input(s): PROBNP in the last 8760 hours. HbA1C: No results for input(s): HGBA1C in the last 72 hours. CBG: No results for input(s): GLUCAP in the last 168 hours. Lipid  Profile: No results for input(s): CHOL, HDL, LDLCALC, TRIG, CHOLHDL, LDLDIRECT in the last 72 hours. Thyroid Function Tests: No results for input(s): TSH, T4TOTAL, FREET4, T3FREE, THYROIDAB in the last 72 hours. Anemia Panel: Recent Labs    03/20/19 1354  TIBC 234*  IRON 132   Sepsis Labs: No results for input(s): PROCALCITON, LATICACIDVEN in the last 168 hours.  No results found for this or any previous visit (from the past 240 hour(s)).   Radiology Studies: Dg Chest 2 View  Result Date: 03/21/2019 CLINICAL DATA:  Shortness of breath  EXAM: CHEST - 2 VIEW COMPARISON:  03/14/2029, CT from 03/19/2019 FINDINGS: Cardiac shadow is within normal limits. Persistent left lower lobe consolidation is noted stable from the prior CT examination. No other significant infiltrate is seen. No significant vascular congestion is noted. No bony abnormality is seen. IMPRESSION: Persistent left lower lobe consolidation. Electronically Signed   By: Inez Catalina M.D.   On: 03/21/2019 08:34    Scheduled Meds: . acetylcysteine  4 mL Nebulization BID  . amiodarone  400 mg Oral BID  . arformoterol  15 mcg Nebulization BID  . budesonide (PULMICORT) nebulizer solution  0.5 mg Nebulization BID  . calcitRIOL  0.25 mcg Oral Daily  . diltiazem  240 mg Oral QHS  . docusate sodium  100 mg Oral BID  . feeding supplement (ENSURE ENLIVE)  237 mL Oral BID BM  . feeding supplement (PRO-STAT SUGAR FREE 64)  30 mL Oral TID WC  . furosemide  80 mg Oral BID  . ipratropium  0.5 mg Nebulization BID  . mouth rinse  15 mL Mouth Rinse BID  . rivaroxaban  15 mg Oral Q supper   Continuous Infusions:    LOS: 19 days    Time spent: 25 minutes   Signature  Lala Lund M.D on 03/22/2019 at 8:45 AM   -  To page go to [email or web address]

## 2019-03-23 ENCOUNTER — Inpatient Hospital Stay (HOSPITAL_COMMUNITY): Payer: Medicare Other | Admitting: Anesthesiology

## 2019-03-23 ENCOUNTER — Encounter (HOSPITAL_COMMUNITY)
Admission: EM | Disposition: A | Discharge status: Home / Self Care | Payer: Self-pay | Source: Home / Self Care | Attending: Internal Medicine

## 2019-03-23 ENCOUNTER — Inpatient Hospital Stay (HOSPITAL_COMMUNITY): Payer: Medicare Other

## 2019-03-23 ENCOUNTER — Encounter (HOSPITAL_COMMUNITY): Payer: Self-pay | Admitting: *Deleted

## 2019-03-23 DIAGNOSIS — I4819 Other persistent atrial fibrillation: Secondary | ICD-10-CM

## 2019-03-23 HISTORY — PX: CARDIOVERSION: SHX1299

## 2019-03-23 LAB — RENAL FUNCTION PANEL
Albumin: 2.6 g/dL — ABNORMAL LOW (ref 3.5–5.0)
Anion gap: 17 — ABNORMAL HIGH (ref 5–15)
BUN: 135 mg/dL — ABNORMAL HIGH (ref 8–23)
CO2: 35 mmol/L — ABNORMAL HIGH (ref 22–32)
Calcium: 8.6 mg/dL — ABNORMAL LOW (ref 8.9–10.3)
Chloride: 90 mmol/L — ABNORMAL LOW (ref 98–111)
Creatinine, Ser: 2.99 mg/dL — ABNORMAL HIGH (ref 0.61–1.24)
GFR calc Af Amer: 21 mL/min — ABNORMAL LOW (ref >60)
GFR calc non Af Amer: 18 mL/min — ABNORMAL LOW (ref >60)
Glucose, Bld: 118 mg/dL — ABNORMAL HIGH (ref 70–99)
Phosphorus: 6.4 mg/dL — ABNORMAL HIGH (ref 2.5–4.6)
Potassium: 3.3 mmol/L — ABNORMAL LOW (ref 3.5–5.1)
Sodium: 142 mmol/L (ref 135–145)

## 2019-03-23 LAB — CBC
HCT: 35.3 % — ABNORMAL LOW (ref 39.0–52.0)
Hemoglobin: 11.5 g/dL — ABNORMAL LOW (ref 13.0–17.0)
MCH: 30.9 pg (ref 26.0–34.0)
MCHC: 32.6 g/dL (ref 30.0–36.0)
MCV: 94.9 fL (ref 80.0–100.0)
Platelets: 138 10*3/uL — ABNORMAL LOW (ref 150–400)
RBC: 3.72 MIL/uL — ABNORMAL LOW (ref 4.22–5.81)
RDW: 16.2 % — ABNORMAL HIGH (ref 11.5–15.5)
WBC: 11.9 10*3/uL — ABNORMAL HIGH (ref 4.0–10.5)
nRBC: 0 % (ref 0.0–0.2)

## 2019-03-23 SURGERY — CARDIOVERSION
Anesthesia: Monitor Anesthesia Care

## 2019-03-23 MED ORDER — PROPOFOL 10 MG/ML IV BOLUS
INTRAVENOUS | Status: DC | PRN
  Administered 2019-03-23: 80 mg via INTRAVENOUS

## 2019-03-23 MED ORDER — LIDOCAINE HCL (CARDIAC) PF 100 MG/5ML IV SOSY
PREFILLED_SYRINGE | INTRAVENOUS | Status: DC | PRN
  Administered 2019-03-23: 40 mg via INTRAVENOUS

## 2019-03-23 MED ORDER — FUROSEMIDE 80 MG PO TABS
80.0000 mg | ORAL_TABLET | Freq: Every day | ORAL | Status: DC
  Administered 2019-03-24 – 2019-03-26 (×3): 80 mg via ORAL
  Filled 2019-03-23 (×3): qty 1

## 2019-03-23 MED ORDER — POTASSIUM CHLORIDE CRYS ER 20 MEQ PO TBCR
40.0000 meq | EXTENDED_RELEASE_TABLET | Freq: Once | ORAL | Status: AC
  Administered 2019-03-23: 40 meq via ORAL
  Filled 2019-03-23: qty 2

## 2019-03-23 MED ORDER — SODIUM CHLORIDE 0.9 % IV SOLN
INTRAVENOUS | Status: AC | PRN
  Administered 2019-03-23: 500 mL via INTRAVENOUS
  Administered 2019-03-23: 07:00:00 via INTRAVENOUS

## 2019-03-23 NOTE — TOC Progression Note (Signed)
Transition of Care [facility]) - Progression Note    Patient Details  Name: David Barton MRN: [id number] Date of Birth: 31-Dec-1932  Transition of Care [facility]) CM/SW [place], LCSW Phone Number: 03/23/2019, 2:31 PM  Clinical Narrative:    CSW continuing to follow for discharge needs.    Expected Discharge Plan: IP Rehab Facility Barriers to Discharge: Continued Medical Work up  Expected Discharge Plan and Services Expected Discharge Plan: [place] In-house Referral: Clinical Social Work   Post Acute Care Choice: IP Rehab Living arrangements for the past 2 months: Single Family Home                           Social Determinants of Health (SDOH) Interventions    Readmission Risk Interventions Readmission Risk Prevention Plan 03/20/2019  Transportation Screening Complete  PCP or Specialist Appt within 5-7 Days Complete  Home Care Screening Complete  Medication Review (RN CM) Complete  Some recent data might be hidden

## 2019-03-23 NOTE — Anesthesia Procedure Notes (Signed)
Procedure Name: General with mask airway Date/Time: 03/23/2019 7:45 AM Performed by: Mariea Clonts, CRNA Pre-anesthesia Checklist: Patient identified, Emergency Drugs available, Suction available, Patient being monitored and Timeout performed Patient Re-evaluated:Patient Re-evaluated prior to induction Oxygen Delivery Method: Non-rebreather mask and Ambu bag

## 2019-03-23 NOTE — Progress Notes (Signed)
Subjective: Interval History: breathing better, less swelling, CV failed.  Objective: Vital signs in last 24 hours: Temp:  [97.9 F (36.6 C)-98.3 F (36.8 C)] 97.9 F (36.6 C) (04/03 0807) Pulse Rate:  [78-96] 83 (04/03 0857) Resp:  [16-22] 21 (04/03 0857) BP: (110-157)/(49-75) 134/58 (04/03 0857) SpO2:  [90 %-99 %] 94 % (04/03 0923) FiO2 (%):  [36 %] 36 % (04/03 0919) Weight:  [105.8 kg] 105.8 kg (04/03 0654) Weight change: 0 kg  Intake/Output from previous day: 04/02 0701 - 04/03 0700 In: -  Out: 2175 [Urine:2175] Intake/Output this shift: Total I/O In: 203 [I.V.:203] Out: -   General appearance: alert, cooperative, no distress, moderately obese and pale Resp: rales bibasilar Cardio: irregularly irregular rhythm and systolic murmur: systolic ejection 2/6, crescendo and decrescendo at 2nd left intercostal space GI: obese, pos bs.  Extremities: edema 1-2+  Lab Results: Recent Labs    03/23/19 0244  WBC 11.9*  HGB 11.5*  HCT 35.3*  PLT 138*   BMET:  Recent Labs    03/22/19 0350 03/23/19 0244  NA 140 142  K 3.6 3.3*  CL 94* 90*  CO2 32 35*  GLUCOSE 99 118*  BUN 141* 135*  CREATININE 2.79* 2.99*  CALCIUM 8.5* 8.6*   Recent Labs    03/20/19 1354  PTH 67*   Iron Studies:  Recent Labs    03/20/19 1354  IRON 132  TIBC 234*    Studies/Results: Dg Chest 2 View  Result Date: 03/23/2019 CLINICAL DATA:  Shortness of breath, cough EXAM: CHEST - 2 VIEW COMPARISON:  03/21/2019 FINDINGS: Small to moderate layering bilateral effusions. Bibasilar atelectasis or infiltrates. Mild cardiomegaly. No overt edema. No acute bony abnormality. IMPRESSION: Layering bilateral effusions with bibasilar atelectasis or infiltrates. Electronically Signed   By: Rolm Baptise M.D.   On: 03/23/2019 07:29    I have reviewed the patient's current medications.  Assessment/Plan: 1 CKD4 new baseline.  Diuresing and can lower dose. Can send home from my standpoint.  2 Anemia  stable 3 HPTH vit D 4 obesity 5 COPD 6 Afib. Per cards 7 Pneu resolved 8 Dm P Lasix 80mg  /d  , vit D, can f/u with Dr. Moshe Cipro after d/c    LOS: 20 days   Jeneen Rinks Nadea Kirkland 03/23/2019,10:04 AM

## 2019-03-23 NOTE — Transfer of Care (Signed)
Immediate Anesthesia Transfer of Care Note  Patient: David Barton  Procedure(s) Performed: CARDIOVERSION (N/A )  Patient Location: Endoscopy Unit  Anesthesia Type:General  Level of Consciousness: awake, alert  and oriented  Airway & Oxygen Therapy: Patient Spontanous Breathing and Patient connected to face mask oxygen  Post-op Assessment: Report given to RN, Post -op Vital signs reviewed and stable and Patient moving all extremities X 4  Post vital signs: Reviewed and stable  Last Vitals:  Vitals Value Taken Time  BP    Temp    Pulse    Resp    SpO2      Last Pain:  Vitals:   03/23/19 0654  TempSrc: Oral  PainSc: 0-No pain      Patients Stated Pain Goal: 0 (21/58/72 7618)  Complications: No apparent anesthesia complications

## 2019-03-23 NOTE — Anesthesia Preprocedure Evaluation (Addendum)
Anesthesia Evaluation  Patient identified by MRN, date of birth, ID band Patient awake    Reviewed: Allergy & Precautions, NPO status , Patient's Chart, lab work & pertinent test results  Airway Mallampati: III  TM Distance: >3 FB Neck ROM: Full    Dental no notable dental hx.    Pulmonary sleep apnea , COPD,  COPD inhaler, former smoker,    Pulmonary exam normal breath sounds clear to auscultation       Cardiovascular hypertension, Pt. on home beta blockers +CHF  Normal cardiovascular exam Rhythm:Regular Rate:Normal  ECG: rate 84. Sinus rhythm with Blocked Premature atrial complexes with Premature ventricular complexes or Fusion complexes  ECHO: 1. The left ventricle has normal systolic function, with an ejection fraction of 60-65%. There is mildly increased left ventricular wall thickness. No evidence of left ventricular regional wall motion abnormalities.  2. The right ventricle has normal systolc function. The cavity was normal. There is no increase in right ventricular wall thickness.  3. Left atrial size was mildly dilated.  4. No evidence present in the left atrial appendage.  5. The mitral valve is grossly normal.  6. The tricuspid valve was grossly normal.  7. The aortic valve is tricuspid.  8. The aortic root and ascending aorta are normal in size and structure.  9. The interatrial septum appears to be lipomatous. 10. The interatrial septum is aneurysmal.   Neuro/Psych PSYCHIATRIC DISORDERS negative neurological ROS     GI/Hepatic negative GI ROS, Neg liver ROS,   Endo/Other  negative endocrine ROS  Renal/GU CRFRenal disease     Musculoskeletal Gout   Abdominal (+) + obese,   Peds  Hematology  (+) anemia ,   Anesthesia Other Findings   Reproductive/Obstetrics                            Anesthesia Physical Anesthesia Plan  ASA: IV  Anesthesia Plan: MAC   Post-op Pain  Management:    Induction: Intravenous  PONV Risk Score and Plan: 1 and Propofol infusion and Treatment may vary due to age or medical condition  Airway Management Planned: Simple Face Mask  Additional Equipment:   Intra-op Plan:   Post-operative Plan:   Informed Consent: I have reviewed the patients History and Physical, chart, labs and discussed the procedure including the risks, benefits and alternatives for the proposed anesthesia with the patient or authorized representative who has indicated his/her understanding and acceptance.   Patient has DNR.  Discussed DNR with patient and Suspend DNR.   Dental advisory given  Plan Discussed with: CRNA  Anesthesia Plan Comments:       Anesthesia Quick Evaluation

## 2019-03-23 NOTE — Progress Notes (Signed)
Occupational Therapy Treatment Patient Details Name: David Barton MRN: [id number] DOB: 1933-09-12 Today's Date: 03/23/2019    History of present illness 85 year old man prior tobacco, psvt, systolic hf ef 36-64% with normalization to 50's osa not on therapy, with a 4-day history of increasing shortness of breath with productive cough.  Flu swab negative at his physician's office on 3/12.  On Tamiflu empirically.  Developed fever and rigors 3/13 and was increasingly short of breath and altered this morning prompting his wife to call EMS. Recovery complicated by afib with RVR; Prior history of pneumonia on background of COPD from remote smoking. No recent travel, no sick contacts.   OT comments  Pt performing ADL functional mobility with RW and O2 satting with 85% on 6L O2 upon exertion for short ADL at sink with pursed lip breathing; pt performing bed mobility from EOB to supine. Pt using pursed lip breathing to return to 5L O2 to >90%. Pt able to use spirometer and flutter valve. Pt fatigues so easily and can handle only 50' ambulation before requiring rest break in standing. Pt continues to require continued OT skilled services for ADL.mobility and safety in CIR setting. OT to follow acutely.    Follow Up Recommendations  CIR    Equipment Recommendations  None recommended by OT    Recommendations for Other Services Rehab consult    Precautions / Restrictions Precautions Precautions: Fall Precaution Comments: watch O2 Sats and HR  Restrictions Weight Bearing Restrictions: No       Mobility Bed Mobility Overal bed mobility: Modified Independent Bed Mobility: Sit to Supine       Sit to supine: Modified independent (Device/Increase time)      Transfers Overall transfer level: Needs assistance Equipment used: Rolling walker (2 wheeled) Transfers: Sit to/from Stand Sit to Stand: Min assist         General transfer comment: Min assist to steady from recliner; good use of  UEs to push up and control descent to sit    Balance Overall balance assessment: Needs assistance Sitting-balance support: Feet supported Sitting balance-Leahy Scale: Good       Standing balance-Leahy Scale: Fair                             ADL either performed or assessed with clinical judgement   ADL Overall ADL's : Needs assistance/impaired                                     Functional mobility during ADLs: Min guard;Rolling walker General ADL Comments: Pt satting to 85% on 6L O2 with ADL functional mobility and quickly returned >90% on 5L O2 at rest after 1 minute of rest      Vision   Vision Assessment?: No apparent visual deficits   Perception     Praxis      Cognition Arousal/Alertness: Awake/alert Behavior During Therapy: WFL for tasks assessed/performed Overall Cognitive Status: Within Functional Limits for tasks assessed                                          Exercises Other Exercises Other Exercises: pursed lip breathing Other Exercises: encouraged use of incentive spirometer + flutter valve. Other Exercises: chair push-ups x5 times   Shoulder Instructions  General Comments      Pertinent Vitals/ Pain       Pain Assessment: No/denies pain  Home Living                                          Prior Functioning/Environment              Frequency  Min 3X/week        Progress Toward Goals  OT Goals(current goals can now be found in the care plan section)  Progress towards OT goals: Progressing toward goals  Acute Rehab OT Goals Patient Stated Goal: get stronger; so wants to walk in the hallway OT Goal Formulation: With patient Time For Goal Achievement: 03/29/19 Potential to Achieve Goals: Good ADL Goals Pt Will Perform Lower Body Bathing: with modified independence;sit to/from stand Pt Will Transfer to Toilet: with modified independence;ambulating Pt Will  Perform Tub/Shower Transfer: with modified independence;ambulating Additional ADL Goal #1: Pt will independently verbalize 3 energy conservation strategies Additional ADL Goal #2: Pt will independently verbalize 3 strategies to prevent falls  Plan Discharge plan remains appropriate    Co-evaluation                 AM-PAC OT "6 Clicks" Daily Activity     Outcome Measure   Help from another person eating meals?: None Help from another person taking care of personal grooming?: None Help from another person toileting, which includes using toliet, bedpan, or urinal?: A Little Help from another person bathing (including washing, rinsing, drying)?: A Little Help from another person to put on and taking off regular upper body clothing?: None Help from another person to put on and taking off regular lower body clothing?: A Little 6 Click Score: 21    End of Session Equipment Utilized During Treatment: Oxygen;Rolling walker  OT Visit Diagnosis: Unsteadiness on feet (R26.81);Repeated falls (R29.6)   Activity Tolerance Patient tolerated treatment well   Patient Left in bed;with call bell/phone within reach   Nurse Communication Mobility status        Time: 1355-1415 OT Time Calculation (min): 20 min  Charges: OT General Charges $OT Visit: 1 Visit OT Treatments $Therapeutic Activity: 8-22 mins  Darryl Nestle) Marsa Aris OTR/L Acute Rehabilitation Services Pager: [phone] Office: [phone]   Fredda Hammed 03/23/2019, 4:13 PM

## 2019-03-23 NOTE — Progress Notes (Signed)
Progress Note  Patient Name: David Barton Date of Encounter: 03/23/2019  Primary Cardiologist: Quay Burow, MD   Subjective   Pt denies CP or dyspnea  Inpatient Medications    Scheduled Meds: . [MAR Hold] acetylcysteine  4 mL Nebulization BID  . [MAR Hold] amiodarone  400 mg Oral BID  . [MAR Hold] arformoterol  15 mcg Nebulization BID  . [MAR Hold] budesonide (PULMICORT) nebulizer solution  0.5 mg Nebulization BID  . [MAR Hold] calcitRIOL  0.25 mcg Oral Daily  . [MAR Hold] diltiazem  120 mg Oral QHS  . [MAR Hold] docusate sodium  100 mg Oral BID  . [MAR Hold] feeding supplement (ENSURE ENLIVE)  237 mL Oral BID BM  . [MAR Hold] feeding supplement (PRO-STAT SUGAR FREE 64)  30 mL Oral TID WC  . [MAR Hold] furosemide  80 mg Oral BID  . [MAR Hold] ipratropium  0.5 mg Nebulization BID  . [MAR Hold] mouth rinse  15 mL Mouth Rinse BID  . [MAR Hold] potassium chloride  40 mEq Oral Once  . [MAR Hold] rivaroxaban  15 mg Oral Q supper  . [MAR Hold] sodium chloride flush  3 mL Intravenous Q12H   Continuous Infusions: . sodium chloride    . sodium chloride     PRN Meds: sodium chloride, [MAR Hold] alum & mag hydroxide-simeth, [MAR Hold] hydrocortisone, [MAR Hold] hydrocortisone cream, [MAR Hold] levalbuterol, [MAR Hold] lip balm, [MAR Hold] loratadine, [MAR Hold] Muscle Rub, [MAR Hold] phenol, [MAR Hold] polyethylene glycol, [MAR Hold] polyvinyl alcohol, [MAR Hold] senna-docusate, [MAR Hold] sodium chloride, [MAR Hold] sodium chloride flush   Vital Signs    Vitals:   03/22/19 1355 03/22/19 1958 03/22/19 2215 03/23/19 0654  BP: (!) 141/75  (!) 157/72 124/67  Pulse: 78 96 91   Resp: 16 18  (!) 22  Temp: 97.9 F (36.6 C)   98.3 F (36.8 C)  TempSrc: Oral   Oral  SpO2: 90% 93% 92% 93%  Weight:    105.8 kg  Height:    5\' 11"  (1.803 m)    Intake/Output Summary (Last 24 hours) at 03/23/2019 0750 Last data filed at 03/23/2019 5784 Gross per 24 hour  Intake -  Output 2175 ml   Net -2175 ml   Last 3 Weights 03/23/2019 03/22/2019 03/21/2019  Weight (lbs) 233 lb 3.2 oz 233 lb 3.2 oz 241 lb 10 oz  Weight (kg) 105.779 kg 105.779 kg 109.6 kg      Telemetry    Atrial fibrillation Personally Reviewed   Physical Exam   GEN: WD NAD Neck: Supple Cardiac: irregular Respiratory: mildly diminished BS bases; no wheeze GI: Soft, NT/ND MS: trace edema Neuro:  Grossly intact  Labs    Chemistry Recent Labs  Lab 03/17/19 0317  03/21/19 0235 03/22/19 0350 03/23/19 0244  NA 141   < > 143 140 142  K 3.6   < > 4.2 3.6 3.3*  CL 101   < > 97* 94* 90*  CO2 28   < > 32 32 35*  GLUCOSE 212*   < > 128* 99 118*  BUN 101*   < > 134* 141* 135*  CREATININE 2.64*   < > 2.72* 2.79* 2.99*  CALCIUM 8.4*   < > 9.0 8.5* 8.6*  PROT 5.1*  --   --   --   --   ALBUMIN 2.3*  --  2.7* 2.6* 2.6*  AST 20  --   --   --   --  ALT 20  --   --   --   --   ALKPHOS 51  --   --   --   --   BILITOT 0.6  --   --   --   --   GFRNONAA 21*   < > 21* 20* 18*  GFRAA 25*   < > 24* 23* 21*  ANIONGAP 12   < > 14 14 17*   < > = values in this interval not displayed.     Hematology Recent Labs  Lab 03/17/19 0317 03/18/19 0403 03/23/19 0244  WBC 6.8 8.3 11.9*  RBC 3.64* 3.69* 3.72*  HGB 11.4* 11.7* 11.5*  HCT 35.4* 35.8* 35.3*  MCV 97.3 97.0 94.9  MCH 31.3 31.7 30.9  MCHC 32.2 32.7 32.6  RDW 15.8* 16.1* 16.2*  PLT 241 224 138*    BNP Recent Labs  Lab 03/20/19 0431  BNP 191.2*    Patient Profile     85 year old male with past medical history of diastolic congestive heart failure, supraventricular tachycardia, hypertension, colon cancer, COPD admitted with pneumonia who we are asked to evaluate for atrial fibrillation and acute on chronic diastolic congestive heart failure.  Patient admitted March 15 with pneumonia requiring intubation.  He has slowly improved.  He developed atrial fibrillation with rapid ventricular response and has been treated with Cardizem.  He developed worsening  edema requiring diuresis and subsequently progressive renal insuff.  Cardiology asked to evaluate.   Echocardiogram shows normal LV function. S/p TEE DCCV 3/27. Reverted to atrial fibrillation.   Assessment & Plan    1 new onset atrial fibrillation-patient remains in atrial fibrillation today.  Rate is controlled.  As outlined in previous note he underwent successful TEE guided cardioversion previously but did not hold sinus rhythm.  Atrial fibrillation may be contributing to diastolic congestive heart failure.  Plan for DCCV today; hopefully amiodarone will help maintain sinus. Cardizem decreased yesterday. Continue xarelto.   2 acute on chronic diastolic congestive heart failure-I/O -2175.  Volume status improved. Lasix per nephrology.  3 acute on chronic stage III kidney disease-Nephrology following and managing diuretics.  4 status post pneumonia-Per primary care.  For questions or updates, please contact [place] Please consult [email or web address] for contact info under        Signed, Kirk Ruths, MD  03/23/2019, 7:50 AM

## 2019-03-23 NOTE — Procedures (Addendum)
Electrical Cardioversion Procedure Note AARYA QUEBEDEAUX [id number] 1933-11-25  Procedure: Electrical Cardioversion Indications:  Atrial Fibrillation  Procedure Details Consent: Risks of procedure as well as the alternatives and risks of each were explained to the (patient/caregiver).  Consent for procedure obtained. Time Out: Verified patient identification, verified procedure, site/side was marked, verified correct patient position, special equipment/implants available, medications/allergies/relevent history reviewed, required imaging and test results available.  Performed  Patient placed on cardiac monitor, pulse oximetry, supplemental oxygen as necessary.  Sedation given: Pt sedated by anesthesia with lidocaine 60 mg mg and diprovan 80 mg IV. Pacer pads placed anterior and posterior chest.  Cardioverted 1 time(s).  Cardioverted at 120J.  Evaluation Findings: Post procedure EKG shows: NSR Complications: None Patient did tolerate procedure well.  Several minutes following DCCV, pt developed recurrent atrial fibrillation; will continue amiodarone and plan DCCV 6-8 weeks after full load.   Kirk Ruths 03/23/2019, 7:54 AM

## 2019-03-23 NOTE — Anesthesia Postprocedure Evaluation (Signed)
Anesthesia Post Note  Patient: David Barton  Procedure(s) Performed: CARDIOVERSION (N/A )     Patient location during evaluation: PACU Anesthesia Type: MAC Level of consciousness: awake and alert Pain management: pain level controlled Vital Signs Assessment: post-procedure vital signs reviewed and stable Respiratory status: spontaneous breathing, nonlabored ventilation, respiratory function stable and patient connected to nasal cannula oxygen Cardiovascular status: stable and blood pressure returned to baseline Postop Assessment: no apparent nausea or vomiting Anesthetic complications: no    Last Vitals:  Vitals:   03/23/19 0923 03/23/19 1359  BP:    Pulse:    Resp:    Temp:  36.6 C  SpO2: 94%     Last Pain:  Vitals:   03/23/19 1359  TempSrc: Oral  PainSc:                  Ryan P Ellender

## 2019-03-23 NOTE — Progress Notes (Signed)
PROGRESS NOTE    David Barton  FTD:[id number] DOB: Jun 11, 1933 DOA: 03/03/2019 PCP: Maury Dus, MD    Brief Narrative:  85 year old gentleman with history of COPD, emphysema, PSVT, essential hypertension, systolic heart failure with known ejection fraction of 30%, alcohol abuse, history of colon cancer who was admitted to the intensive care unit on 03/04/2019 with 4 days history of increasing shortness of breath, productive cough.  He was on Tamiflu empirically.  Patient was transferred to intensive care unit from [facility] on ventilator and treated as pneumonia and septic shock.  He has ultimately improved but remains on high flow oxygen.  Currently on medical floor.  Has persistent A. fib, underwent TEE and cardioversion with transient sinus rhythm now back to A. fib.  COVID-19 ruled out.  He has finished his antibiotic therapy.  Developed rapid A. fib new onset in the hospital.   Subjective:  Patient in bed, appears comfortable, denies any headache, no fever, no chest pain or pressure, improved shortness of breath , no abdominal pain. No focal weakness.    Assessment & Plan:   Acute hypoxic respiratory failure secondary to chronic diastolic CHF EF preserved at 60% due to A. fib RVR due to Afib RVR: Initially suspicion was for community-acquired pneumonia and he has finished antibiotic course in ICU for tach, he was also checked for COVID-19 and was negative.  He has been aggressively diuresed and now BUN and creatinine has shot up although he still has third spacing and edema, some element of hypoalbuminemia causing third spacing of fluid and relative intravascular depletion. Renal on board and managing diuretics and fluids.  Already nephrology following nephrology is guiding fluid management and diuretic dosing.  Chest CT on 03/19/2019 noted and reviewed with pulmonary, left lower lobe collapse/consolidation.  Have added Mucomyst, chest PT and percussion along with deep  suctioning.  Continue chest PT his shortness of breath and oxygen demand have gone down, continue diuresing along with chest PT will repeat two-view chest x-ray in a few days clinically he has improved from 10 L nasal cannula oxygen he is down to 5 L nasal cannula oxygen on 03/23/2019.    New onset A. fib with RVR [place] vas 2 score of at least 3: Echo noted, TSH stable.  Discussed with cardiologist he underwent second cardioversion attempt on 03/23/2019 which failed he remains in A. fib, continue combination of amiodarone, Xarelto and monitor.  COPD no wheezing.  No exacerbation: supportive care stop steroids.  Acute kidney injury with history of chronic kidney disease stage 4: His baseline creatinine 1.7, for now diuretics held, nephrology consulted.  Generalized deconditioning: We will continue to work with PT OT.  He has done some improvement now.  Hypokalemia.  Replaced.    DVT prophylaxis: Xarelto. Code Status: DNR Family Communication: No family at bedside. Disposition Plan: Home versus acute rehab.   Consultants:   PCCM, signed off  Cardiology for A. Fib.  Nephrology  Procedures:   TEE with cardioversion.  2nd failed Cardioversion 03/23/19  Antimicrobials:   Rocephin and azithromycin, finished antibiotic therapy.  Objective: Vitals:   03/23/19 0654 03/23/19 0807 03/23/19 0817 03/23/19 0857  BP: 124/67 (!) 110/49 134/60 (!) 134/58  Pulse:  79 83 83  Resp: (!) 22 17 16  (!) 21  Temp: 98.3 F (36.8 C) 97.9 F (36.6 C)    TempSrc: Oral Oral    SpO2: 93% 99% 95% 91%  Weight: 105.8 kg     Height: 5\' 11"  (1.803  m)       Intake/Output Summary (Last 24 hours) at 03/23/2019 0912 Last data filed at 03/23/2019 0900 Gross per 24 hour  Intake 203 ml  Output 2175 ml  Net -1972 ml   Filed Weights   03/21/19 0623 03/22/19 0500 03/23/19 0654  Weight: 109.6 kg 105.8 kg 105.8 kg    Examination:  Awake Alert, Oriented X 3, No new F.N deficits, Normal  affect .AT,PERRAL Supple Neck,No JVD, No cervical lymphadenopathy appriciated.  Symmetrical Chest wall movement, reduced left lower lobe breath sounds iRRR,No Gallops, Rubs or new Murmurs, No Parasternal Heave +ve B.Sounds, Abd Soft, No tenderness, No organomegaly appriciated, No rebound - guarding or rigidity. No Cyanosis, Clubbing, +ve edema, No new Rash or bruise    Data Reviewed: I have personally reviewed following labs and imaging studies  CBC: Recent Labs  Lab 03/17/19 0317 03/18/19 0403 03/23/19 0244  WBC 6.8 8.3 11.9*  HGB 11.4* 11.7* 11.5*  HCT 35.4* 35.8* 35.3*  MCV 97.3 97.0 94.9  PLT 241 224 062*   Basic Metabolic Panel: Recent Labs  Lab 03/17/19 0317 03/18/19 0403 03/19/19 0543 03/20/19 0431 03/21/19 0235 03/22/19 0350 03/23/19 0244  NA 141 140 141 143 143 140 142  K 3.6 4.3 4.5 4.7 4.2 3.6 3.3*  CL 101 96* 96* 97* 97* 94* 90*  CO2 28 30 33* 35* 32 32 35*  GLUCOSE 212* 181* 168* 170* 128* 99 118*  BUN 101* 110* 124* 124* 134* 141* 135*  CREATININE 2.64* 2.41* 2.51* 2.65* 2.72* 2.79* 2.99*  CALCIUM 8.4* 8.7* 8.9 8.8* 9.0 8.5* 8.6*  MG 2.1 2.0 1.9 1.9  --   --   --   PHOS  --   --   --   --  6.3* 6.3* 6.4*   GFR: Estimated Creatinine Clearance: 23.2 mL/min (A) (by C-G formula based on SCr of 2.99 mg/dL (H)). Liver Function Tests: Recent Labs  Lab 03/17/19 0317 03/21/19 0235 03/22/19 0350 03/23/19 0244  AST 20  --   --   --   ALT 20  --   --   --   ALKPHOS 51  --   --   --   BILITOT 0.6  --   --   --   PROT 5.1*  --   --   --   ALBUMIN 2.3* 2.7* 2.6* 2.6*   No results for input(s): LIPASE, AMYLASE in the last 168 hours. No results for input(s): AMMONIA in the last 168 hours. Coagulation Profile: No results for input(s): INR, PROTIME in the last 168 hours. Cardiac Enzymes: No results for input(s): CKTOTAL, CKMB, CKMBINDEX, TROPONINI in the last 168 hours. BNP (last 3 results) No results for input(s): PROBNP in the last 8760  hours. HbA1C: No results for input(s): HGBA1C in the last 72 hours. CBG: No results for input(s): GLUCAP in the last 168 hours. Lipid Profile: No results for input(s): CHOL, HDL, LDLCALC, TRIG, CHOLHDL, LDLDIRECT in the last 72 hours. Thyroid Function Tests: No results for input(s): TSH, T4TOTAL, FREET4, T3FREE, THYROIDAB in the last 72 hours. Anemia Panel: Recent Labs    03/20/19 1354  TIBC 234*  IRON 132   Sepsis Labs: No results for input(s): PROCALCITON, LATICACIDVEN in the last 168 hours.  No results found for this or any previous visit (from the past 240 hour(s)).   Radiology Studies: Dg Chest 2 View  Result Date: 03/23/2019 CLINICAL DATA:  Shortness of breath, cough EXAM: CHEST - 2 VIEW COMPARISON:  03/21/2019 FINDINGS:  Small to moderate layering bilateral effusions. Bibasilar atelectasis or infiltrates. Mild cardiomegaly. No overt edema. No acute bony abnormality. IMPRESSION: Layering bilateral effusions with bibasilar atelectasis or infiltrates. Electronically Signed   By: Rolm Baptise M.D.   On: 03/23/2019 07:29    Scheduled Meds:  acetylcysteine  4 mL Nebulization BID   amiodarone  400 mg Oral BID   arformoterol  15 mcg Nebulization BID   budesonide (PULMICORT) nebulizer solution  0.5 mg Nebulization BID   calcitRIOL  0.25 mcg Oral Daily   diltiazem  120 mg Oral QHS   docusate sodium  100 mg Oral BID   feeding supplement (ENSURE ENLIVE)  237 mL Oral BID BM   feeding supplement (PRO-STAT SUGAR FREE 64)  30 mL Oral TID WC   furosemide  80 mg Oral BID   ipratropium  0.5 mg Nebulization BID   mouth rinse  15 mL Mouth Rinse BID   rivaroxaban  15 mg Oral Q supper   sodium chloride flush  3 mL Intravenous Q12H   Continuous Infusions:  sodium chloride       LOS: 20 days    Time spent: 25 minutes   Signature  Lala Lund M.D on 03/23/2019 at 9:12 AM   -  To page go to [email or web address]

## 2019-03-24 ENCOUNTER — Inpatient Hospital Stay (HOSPITAL_COMMUNITY): Payer: Medicare Other

## 2019-03-24 LAB — RENAL FUNCTION PANEL
Albumin: 2.4 g/dL — ABNORMAL LOW (ref 3.5–5.0)
Anion gap: 17 — ABNORMAL HIGH (ref 5–15)
BUN: 131 mg/dL — ABNORMAL HIGH (ref 8–23)
CO2: 34 mmol/L — ABNORMAL HIGH (ref 22–32)
Calcium: 8.3 mg/dL — ABNORMAL LOW (ref 8.9–10.3)
Chloride: 88 mmol/L — ABNORMAL LOW (ref 98–111)
Creatinine, Ser: 2.99 mg/dL — ABNORMAL HIGH (ref 0.61–1.24)
GFR calc Af Amer: 21 mL/min — ABNORMAL LOW (ref >60)
GFR calc non Af Amer: 18 mL/min — ABNORMAL LOW (ref >60)
Glucose, Bld: 137 mg/dL — ABNORMAL HIGH (ref 70–99)
Phosphorus: 5.6 mg/dL — ABNORMAL HIGH (ref 2.5–4.6)
Potassium: 3.3 mmol/L — ABNORMAL LOW (ref 3.5–5.1)
Sodium: 139 mmol/L (ref 135–145)

## 2019-03-24 MED ORDER — ACETYLCYSTEINE 20 % IN SOLN
4.0000 mL | Freq: Two times a day (BID) | RESPIRATORY_TRACT | Status: AC
  Administered 2019-03-24 (×2): 4 mL via RESPIRATORY_TRACT
  Filled 2019-03-24 (×2): qty 4

## 2019-03-24 MED ORDER — POTASSIUM CHLORIDE CRYS ER 10 MEQ PO TBCR
EXTENDED_RELEASE_TABLET | ORAL | Status: AC
  Filled 2019-03-24: qty 4

## 2019-03-24 MED ORDER — POTASSIUM CHLORIDE CRYS ER 20 MEQ PO TBCR: 40.0000 meq | EXTENDED_RELEASE_TABLET | Freq: Once | ORAL | Status: DC

## 2019-03-24 MED ORDER — POTASSIUM CHLORIDE CRYS ER 20 MEQ PO TBCR
40.0000 meq | EXTENDED_RELEASE_TABLET | Freq: Once | ORAL | Status: AC
  Administered 2019-03-24: 06:00:00 40 meq via ORAL
  Filled 2019-03-24: qty 2

## 2019-03-24 NOTE — Progress Notes (Addendum)
Physical Therapy Treatment Patient Details Name: David Barton MRN: [id number] DOB: Jun 26, 1933 Today's Date: 03/24/2019    History of Present Illness Pt is an 85 y/o male with PMH including but not limited to COPD, prior tobacco use, psvt, systolic hf ef 90-24% with normalization to 50's presenting with a four day history of increasing SOB with productive cough.  Flu swab negative at his physician's office on 3/12. Recovery complicated by afib with RVR now s/p cardioversion on 4/3 (unable to maintain sinus).    PT Comments    Pt making progress with functional mobility. He remains limited secondary to pulmonary tolerance to activity. Pt on 6L of O2 throughout with SPO2 fluctuating between 88% and 93%. Pt would continue to benefit from skilled physical therapy services at this time while admitted and after d/c to address the below listed limitations in order to improve overall safety and independence with functional mobility.    Follow Up Recommendations  CIR;Supervision/Assistance - 24 hour;Other (comment)(pt wishes to d/c home with wife with HHPT services)     Equipment Recommendations  (rollator)    Recommendations for Other Services       Precautions / Restrictions Precautions Precautions: Fall Precaution Comments: monitor SPO2 and HR Restrictions Weight Bearing Restrictions: No    Mobility  Bed Mobility Overal bed mobility: Needs Assistance Bed Mobility: Supine to Sit;Sit to Supine     Supine to sit: Supervision Sit to supine: Supervision   General bed mobility comments: use of bed rail  Transfers Overall transfer level: Needs assistance Equipment used: None Transfers: Sit to/from Stand Sit to Stand: Supervision         General transfer comment: supervision with one UE on bed rail  Ambulation/Gait             General Gait Details: pt declining ambulation this session   Stairs             Wheelchair Mobility    Modified Rankin (Stroke  Patients Only)       Balance Overall balance assessment: Needs assistance Sitting-balance support: Feet supported Sitting balance-Leahy Scale: Good Sitting balance - Comments: pt able to tolerate sitting EOB for ~12 minutes with supervision; on 6L of O2 throughout with SPO2 maintaining at 88-93%     Standing balance-Leahy Scale: Fair                              Cognition Arousal/Alertness: Awake/alert Behavior During Therapy: WFL for tasks assessed/performed Overall Cognitive Status: Within Functional Limits for tasks assessed                                        Exercises      General Comments        Pertinent Vitals/Pain Pain Assessment: No/denies pain    Home Living                      Prior Function            PT Goals (current goals can now be found in the care plan section) Acute Rehab PT Goals PT Goal Formulation: With patient Time For Goal Achievement: 03/23/19 Potential to Achieve Goals: Good Progress towards PT goals: Progressing toward goals    Frequency    Min 3X/week      PT Plan Current plan remains appropriate  Co-evaluation              AM-PAC PT "6 Clicks" Mobility   Outcome Measure  Help needed turning from your back to your side while in a flat bed without using bedrails?: None Help needed moving from lying on your back to sitting on the side of a flat bed without using bedrails?: None Help needed moving to and from a bed to a chair (including a wheelchair)?: A Little Help needed standing up from a chair using your arms (e.g., wheelchair or bedside chair)?: A Little Help needed to walk in hospital room?: A Little Help needed climbing 3-5 steps with a railing? : A Lot 6 Click Score: 19    End of Session Equipment Utilized During Treatment: Oxygen Activity Tolerance: Patient tolerated treatment well Patient left: in bed;with call bell/phone within reach(pt reported he has been up in  the chair all morning) Nurse Communication: Mobility status PT Visit Diagnosis: Unsteadiness on feet (R26.81)     Time: 4158-3094 PT Time Calculation (min) (ACUTE ONLY): 18 min  Charges:  $Therapeutic Activity: 8-22 mins                     Sherie Don, PT, DPT  Acute Rehabilitation Services Pager [phone] Office [place] 03/24/2019, 3:42 PM

## 2019-03-24 NOTE — Progress Notes (Addendum)
PROGRESS NOTE    David Barton  RXV:[id number] DOB: [birth year] DOA: 03/03/2019 PCP: Maury Dus, MD    Brief Narrative:  85 year old gentleman with history of COPD, emphysema, PSVT, essential hypertension, systolic heart failure with known ejection fraction of 30%, alcohol abuse, history of colon cancer who was admitted to the intensive care unit on 03/04/2019 with 4 days history of increasing shortness of breath, productive cough.  He was on Tamiflu empirically.  Patient was transferred to intensive care unit from [facility] on ventilator and treated as pneumonia and septic shock.  He has ultimately improved but remains on high flow oxygen.  Currently on medical floor.  Has persistent A. fib, underwent TEE and cardioversion with transient sinus rhythm now back to A. fib.  COVID-19 ruled out.  He has finished his antibiotic therapy.  Developed rapid A. fib new onset in the hospital.   Subjective:  Patient in bed, appears comfortable, denies any headache, no fever, no chest pain or pressure, says his shortness of breath is about 40% improved than yesterday, no abdominal pain. No focal weakness.    Assessment & Plan:   Acute hypoxic respiratory failure secondary to chronic diastolic CHF EF preserved at 60% due to A. fib RVR due to Afib RVR: Initially suspicion was for community-acquired pneumonia and he has finished antibiotic course in ICU for tach, he was also checked for COVID-19 and was negative.  He has been aggressively diuresed and now BUN and creatinine has shot up although he still has third spacing and edema, some element of hypoalbuminemia causing third spacing of fluid and relative intravascular depletion. Renal on board and managing diuretics and fluids.  Already nephrology following nephrology is guiding fluid management and diuretic dosing.  Chest CT on 03/19/2019 noted and reviewed with pulmonary, left lower lobe collapse/consolidation.  Have added Mucomyst, chest PT  and percussion along with deep suctioning.  Continue chest PT his shortness of breath and oxygen demand have gone down, continue diuresing along with chest PT , two-view chest x-ray on 03/24/2019 is much better, left lower lobe seems to be opening with chest PT and Mucomyst, continue on 03/24/2019.  Also oxygen demand is slowing down, case discussed with wife on 03/23/2019 likely discharge on 03/25/2019 if he continues to improve.    New onset A. fib with RVR [place] vas 2 score of at least 3: Echo noted, TSH stable.  Discussed with cardiologist he underwent second cardioversion attempt on 03/23/2019 which failed he remains in A. fib, continue combination of amiodarone, Xarelto and monitor.  Will go home on amiodarone 400 twice daily and then will be switched to 200 once a day on 04/07/2019.  COPD no wheezing.  No exacerbation: supportive care stop steroids.  Acute kidney injury with history of chronic kidney disease stage 4: His baseline creatinine 1.7, for now diuretics held, nephrology consulted.  Generalized deconditioning: We will continue to work with PT OT.  He has done some improvement now.  Hypokalemia.  Replaced again, will monitor.    DVT prophylaxis: Xarelto. Code Status: DNR Family Communication: No family at bedside. Disposition Plan: Home versus acute rehab.   Consultants:   PCCM, signed off  Cardiology for A. Fib.  Nephrology  Procedures:   TEE with cardioversion.  2nd failed Cardioversion 03/23/19  Antimicrobials:   Rocephin and azithromycin, finished antibiotic therapy.  Objective: Vitals:   03/24/19 0549 03/24/19 0550 03/24/19 0628 03/24/19 1014  BP: 128/77 128/77  111/67  Pulse: 90 (!) 102  95  Resp: 20 20  18   Temp: 98.1 F (36.7 C)   98.1 F (36.7 C)  TempSrc: Oral   Oral  SpO2: 94% 92%  97%  Weight:   105.3 kg   Height:        Intake/Output Summary (Last 24 hours) at 03/24/2019 1058 Last data filed at 03/24/2019 0839 Gross per 24 hour  Intake 1040 ml   Output 2200 ml  Net -1160 ml   Filed Weights   03/22/19 0500 03/23/19 0654 03/24/19 0628  Weight: 105.8 kg 105.8 kg 105.3 kg    Examination:  Awake Alert, Oriented X 3, No new F.N deficits, Normal affect [place].AT,PERRAL Supple Neck,No JVD, No cervical lymphadenopathy appriciated.  Symmetrical Chest wall movement, reduced left lower lobe breath sounds but better than yesterday iRRR,No Gallops, Rubs or new Murmurs, No Parasternal Heave +ve B.Sounds, Abd Soft, No tenderness, No organomegaly appriciated, No rebound - guarding or rigidity. No Cyanosis, Clubbing, +ve edema, No new Rash or bruise    Data Reviewed: I have personally reviewed following labs and imaging studies  CBC: Recent Labs  Lab 03/18/19 0403 03/23/19 0244  WBC 8.3 11.9*  HGB 11.7* 11.5*  HCT 35.8* 35.3*  MCV 97.0 94.9  PLT 224 621*   Basic Metabolic Panel: Recent Labs  Lab 03/18/19 0403 03/19/19 0543 03/20/19 0431 03/21/19 0235 03/22/19 0350 03/23/19 0244 03/24/19 0246  NA 140 141 143 143 140 142 139  K 4.3 4.5 4.7 4.2 3.6 3.3* 3.3*  CL 96* 96* 97* 97* 94* 90* 88*  CO2 30 33* 35* 32 32 35* 34*  GLUCOSE 181* 168* 170* 128* 99 118* 137*  BUN 110* 124* 124* 134* 141* 135* 131*  CREATININE 2.41* 2.51* 2.65* 2.72* 2.79* 2.99* 2.99*  CALCIUM 8.7* 8.9 8.8* 9.0 8.5* 8.6* 8.3*  MG 2.0 1.9 1.9  --   --   --   --   PHOS  --   --   --  6.3* 6.3* 6.4* 5.6*   GFR: Estimated Creatinine Clearance: 23.1 mL/min (A) (by C-G formula based on SCr of 2.99 mg/dL (H)). Liver Function Tests: Recent Labs  Lab 03/21/19 0235 03/22/19 0350 03/23/19 0244 03/24/19 0246  ALBUMIN 2.7* 2.6* 2.6* 2.4*   No results for input(s): LIPASE, AMYLASE in the last 168 hours. No results for input(s): AMMONIA in the last 168 hours. Coagulation Profile: No results for input(s): INR, PROTIME in the last 168 hours. Cardiac Enzymes: No results for input(s): CKTOTAL, CKMB, CKMBINDEX, TROPONINI in the last 168 hours. BNP (last 3  results) No results for input(s): PROBNP in the last 8760 hours. HbA1C: No results for input(s): HGBA1C in the last 72 hours. CBG: No results for input(s): GLUCAP in the last 168 hours. Lipid Profile: No results for input(s): CHOL, HDL, LDLCALC, TRIG, CHOLHDL, LDLDIRECT in the last 72 hours. Thyroid Function Tests: No results for input(s): TSH, T4TOTAL, FREET4, T3FREE, THYROIDAB in the last 72 hours. Anemia Panel: No results for input(s): VITAMINB12, FOLATE, FERRITIN, TIBC, IRON, RETICCTPCT in the last 72 hours. Sepsis Labs: No results for input(s): PROCALCITON, LATICACIDVEN in the last 168 hours.  No results found for this or any previous visit (from the past 240 hour(s)).   Radiology Studies: Dg Chest 2 View  Result Date: 03/24/2019 CLINICAL DATA:  Shortness of breath EXAM: CHEST - 2 VIEW COMPARISON:  Yesterday FINDINGS: Borderline heart size accentuated by fat pad. Small pleural effusions with adjacent lower lobe opacity, mildly improved. No Kerley lines. No pneumothorax. Large lung  volumes in this patient with COPD. IMPRESSION: 1. Mildly improved small pleural effusions and lower lobe opacification 2. COPD. Electronically Signed   By: Monte Fantasia M.D.   On: 03/24/2019 07:47   Dg Chest 2 View  Result Date: 03/23/2019 CLINICAL DATA:  Shortness of breath, cough EXAM: CHEST - 2 VIEW COMPARISON:  03/21/2019 FINDINGS: Small to moderate layering bilateral effusions. Bibasilar atelectasis or infiltrates. Mild cardiomegaly. No overt edema. No acute bony abnormality. IMPRESSION: Layering bilateral effusions with bibasilar atelectasis or infiltrates. Electronically Signed   By: Rolm Baptise M.D.   On: 03/23/2019 07:29    Scheduled Meds: . acetylcysteine  4 mL Nebulization BID  . amiodarone  400 mg Oral BID  . arformoterol  15 mcg Nebulization BID  . budesonide (PULMICORT) nebulizer solution  0.5 mg Nebulization BID  . calcitRIOL  0.25 mcg Oral Daily  . diltiazem  120 mg Oral QHS  .  docusate sodium  100 mg Oral BID  . feeding supplement (ENSURE ENLIVE)  237 mL Oral BID BM  . feeding supplement (PRO-STAT SUGAR FREE 64)  30 mL Oral TID WC  . furosemide  80 mg Oral Daily  . ipratropium  0.5 mg Nebulization BID  . mouth rinse  15 mL Mouth Rinse BID  . rivaroxaban  15 mg Oral Q supper  . sodium chloride flush  3 mL Intravenous Q12H   Continuous Infusions: . sodium chloride       LOS: 21 days    Time spent: 25 minutes   Signature  Lala Lund M.D on 03/24/2019 at 10:58 AM   -  To page go to [email or web address]

## 2019-03-24 NOTE — Progress Notes (Signed)
Subjective: Interval History: has no complaint , breathing ok.  Objective: Vital signs in last 24 hours: Temp:  [97.8 F (36.6 C)-98.5 F (36.9 C)] 98.1 F (36.7 C) (04/04 0549) Pulse Rate:  [79-102] 102 (04/04 0550) Resp:  [16-24] 20 (04/04 0550) BP: (107-134)/(49-77) 128/77 (04/04 0550) SpO2:  [91 %-99 %] 92 % (04/04 0550) FiO2 (%):  [36 %] 36 % (04/03 0919) Weight:  [105.8 kg] 105.8 kg (04/03 0654) Weight change:   Intake/Output from previous day: 04/03 0701 - 04/04 0700 In: 1323 [P.O.:1120; I.V.:203] Out: 1050 [Urine:1050] Intake/Output this shift: Total I/O In: 240 [P.O.:240] Out: -   General appearance: alert, cooperative, moderately obese and pale Resp: diminished breath sounds bilaterally and rales bibasilar Cardio: irregularly irregular rhythm and systolic murmur: systolic ejection 2/6, crescendo and decrescendo at 2nd left intercostal space GI: obese, pos bs, liver down 5 cm Extremities: edema 2+  Lab Results: Recent Labs    03/23/19 0244  WBC 11.9*  HGB 11.5*  HCT 35.3*  PLT 138*   BMET:  Recent Labs    03/23/19 0244 03/24/19 0246  NA 142 139  K 3.3* 3.3*  CL 90* 88*  CO2 35* 34*  GLUCOSE 118* 137*  BUN 135* 131*  CREATININE 2.99* 2.99*  CALCIUM 8.6* 8.3*   No results for input(s): PTH in the last 72 hours. Iron Studies: No results for input(s): IRON, TIBC, TRANSFERRIN, FERRITIN in the last 72 hours.  Studies/Results: Dg Chest 2 View  Result Date: 03/23/2019 CLINICAL DATA:  Shortness of breath, cough EXAM: CHEST - 2 VIEW COMPARISON:  03/21/2019 FINDINGS: Small to moderate layering bilateral effusions. Bibasilar atelectasis or infiltrates. Mild cardiomegaly. No overt edema. No acute bony abnormality. IMPRESSION: Layering bilateral effusions with bibasilar atelectasis or infiltrates. Electronically Signed   By: Rolm Baptise M.D.   On: 03/23/2019 07:29    I have reviewed the patient's current medications.  Assessment/Plan: 1 CKD4 Cr stable.  Vol even past 24 h. Still vol xs allow intravasc equilibration then [place] again.   2 DM controlled 3 Afib rate control, anticoag 4 Obesity 5 COPD 6 Anemia stable 7 HPTH vit D 8 CM P Lasix q d, ^ soon, rehab. Ok to go to rehab from our standpoint    LOS: 21 days   David Barton 03/24/2019,6:10 AM

## 2019-03-24 NOTE — Progress Notes (Addendum)
Progress Note  Patient Name: David Barton Date of Encounter: 03/24/2019  Primary Cardiologist: Quay Burow, MD   Subjective   No CP or dyspnea  Inpatient Medications    Scheduled Meds: . amiodarone  400 mg Oral BID  . arformoterol  15 mcg Nebulization BID  . budesonide (PULMICORT) nebulizer solution  0.5 mg Nebulization BID  . calcitRIOL  0.25 mcg Oral Daily  . diltiazem  120 mg Oral QHS  . docusate sodium  100 mg Oral BID  . feeding supplement (ENSURE ENLIVE)  237 mL Oral BID BM  . feeding supplement (PRO-STAT SUGAR FREE 64)  30 mL Oral TID WC  . furosemide  80 mg Oral Daily  . ipratropium  0.5 mg Nebulization BID  . mouth rinse  15 mL Mouth Rinse BID  . rivaroxaban  15 mg Oral Q supper  . sodium chloride flush  3 mL Intravenous Q12H   Continuous Infusions: . sodium chloride     PRN Meds: alum & mag hydroxide-simeth, hydrocortisone, hydrocortisone cream, levalbuterol, lip balm, loratadine, Muscle Rub, phenol, polyethylene glycol, polyvinyl alcohol, senna-docusate, sodium chloride, sodium chloride flush   Vital Signs    Vitals:   03/24/19 0549 03/24/19 0550 03/24/19 0628 03/24/19 1014  BP: 128/77 128/77  111/67  Pulse: 90 (!) 102  95  Resp: 20 20  18   Temp: 98.1 F (36.7 C)   98.1 F (36.7 C)  TempSrc: Oral   Oral  SpO2: 94% 92%  97%  Weight:   105.3 kg   Height:        Intake/Output Summary (Last 24 hours) at 03/24/2019 1044 Last data filed at 03/24/2019 0839 Gross per 24 hour  Intake 1040 ml  Output 2200 ml  Net -1160 ml   Last 3 Weights 03/24/2019 03/23/2019 03/22/2019  Weight (lbs) 232 lb 2.3 oz 233 lb 3.2 oz 233 lb 3.2 oz  Weight (kg) 105.3 kg 105.779 kg 105.779 kg      Telemetry    Atrial fibrillation rate controlled; Personally Reviewed   Physical Exam   GEN: WD  WN NAD Neck: Supple, no JVD Cardiac: irregular, no murmur Respiratory: No rhonchi GI: Soft, NT/ND, no masses MS: trace edema Neuro:  No focal findings  Labs    Chemistry  Recent Labs  Lab 03/22/19 0350 03/23/19 0244 03/24/19 0246  NA 140 142 139  K 3.6 3.3* 3.3*  CL 94* 90* 88*  CO2 32 35* 34*  GLUCOSE 99 118* 137*  BUN 141* 135* 131*  CREATININE 2.79* 2.99* 2.99*  CALCIUM 8.5* 8.6* 8.3*  ALBUMIN 2.6* 2.6* 2.4*  GFRNONAA 20* 18* 18*  GFRAA 23* 21* 21*  ANIONGAP 14 17* 17*     Hematology Recent Labs  Lab 03/18/19 0403 03/23/19 0244  WBC 8.3 11.9*  RBC 3.69* 3.72*  HGB 11.7* 11.5*  HCT 35.8* 35.3*  MCV 97.0 94.9  MCH 31.7 30.9  MCHC 32.7 32.6  RDW 16.1* 16.2*  PLT 224 138*    BNP Recent Labs  Lab 03/20/19 0431  BNP 191.2*    Patient Profile     85 year old male with past medical history of diastolic congestive heart failure, supraventricular tachycardia, hypertension, colon cancer, COPD admitted with pneumonia who we are asked to evaluate for atrial fibrillation and acute on chronic diastolic congestive heart failure.  Patient admitted March 15 with pneumonia requiring intubation.  He has slowly improved.  He developed atrial fibrillation with rapid ventricular response and has been treated with Cardizem.  He  developed worsening edema requiring diuresis and subsequently progressive renal insuff.  Cardiology asked to evaluate.   Echocardiogram shows normal LV function. S/p TEE DCCV 3/27. Reverted to atrial fibrillation.   Assessment & Plan    1 new onset atrial fibrillation-patient underwent repeat cardioversion yesterday.  He held sinus for 3 minutes and then returned to atrial fibrillation.  Plan will be rate control and anticoagulation for now.  Continue present dose of Cardizem.  Continue amiodarone 400 mg twice daily to complete 2 weeks then decrease to 200 mg daily thereafter.  We will plan to repeat attempt at cardioversion in 8 to 12 weeks.  If he does not hold sinus rhythm, then would plan rate control and anticoagulation long-term.  My hope would be that reestablishing sinus would help with chronic diastolic congestive heart  failure.  Continue xarelto.   2 acute on chronic diastolic congestive heart failure-I/O -1327.  Volume status improved. Lasix per nephrology.  3 acute on chronic stage III kidney disease-Nephrology following and managing diuretics.  4 status post pneumonia-Per primary care.  We will see again Monday.  Please call prior to then with questions.  For questions or updates, please contact [place] Please consult [email or web address] for contact info under        Signed, Kirk Ruths, MD  03/24/2019, 10:44 AM

## 2019-03-25 ENCOUNTER — Encounter (HOSPITAL_COMMUNITY): Payer: Self-pay | Admitting: Cardiology

## 2019-03-25 LAB — RENAL FUNCTION PANEL
Albumin: 2.3 g/dL — ABNORMAL LOW (ref 3.5–5.0)
Anion gap: 15 (ref 5–15)
BUN: 128 mg/dL — ABNORMAL HIGH (ref 8–23)
CO2: 33 mmol/L — ABNORMAL HIGH (ref 22–32)
Calcium: 8 mg/dL — ABNORMAL LOW (ref 8.9–10.3)
Chloride: 89 mmol/L — ABNORMAL LOW (ref 98–111)
Creatinine, Ser: 3.18 mg/dL — ABNORMAL HIGH (ref 0.61–1.24)
GFR calc Af Amer: 20 mL/min — ABNORMAL LOW (ref >60)
GFR calc non Af Amer: 17 mL/min — ABNORMAL LOW (ref >60)
Glucose, Bld: 126 mg/dL — ABNORMAL HIGH (ref 70–99)
Phosphorus: 5.2 mg/dL — ABNORMAL HIGH (ref 2.5–4.6)
Potassium: 3.8 mmol/L (ref 3.5–5.1)
Sodium: 137 mmol/L (ref 135–145)

## 2019-03-25 LAB — MAGNESIUM: Magnesium: 1.5 mg/dL — ABNORMAL LOW (ref 1.7–2.4)

## 2019-03-25 MED ORDER — POTASSIUM CHLORIDE CRYS ER 20 MEQ PO TBCR
20.0000 meq | EXTENDED_RELEASE_TABLET | Freq: Once | ORAL | Status: AC
  Administered 2019-03-25: 09:00:00 20 meq via ORAL
  Filled 2019-03-25: qty 1

## 2019-03-25 MED ORDER — MAGNESIUM SULFATE 2 GM/50ML IV SOLN
2.0000 g | Freq: Once | INTRAVENOUS | Status: AC
  Administered 2019-03-25: 2 g via INTRAVENOUS
  Filled 2019-03-25: qty 50

## 2019-03-25 NOTE — Progress Notes (Signed)
Notified by tele at approx 0330 patient desaturation to mid to low 80's. Patient assessed; no distress. Increased HFNC to 4L. Saturation increased to 90's. Remained at 4L for duration of night shift. Day RN notified of O2 increase.   Notified by tele approximately 0630 patient desaturation. Patient assessed; no distress and nasal cannula had shifted out of patient's nose.Nasal cannula replaced and saturation increased.

## 2019-03-25 NOTE — Progress Notes (Signed)
ANTICOAGULATION CONSULT NOTE - Pharmacy Consult for Xarelto Indication: atrial fibrillation, non valvular  Allergies  Allergen Reactions  . Flexeril [Cyclobenzaprine] Other (See Comments)    Unknown reaction     Patient Measurements: Height: 5\' 11"  (180.3 cm) Weight: 227 lb 15.3 oz (103.4 kg) IBW/kg (Calculated) : 75.3  Vital Signs: Temp: 97.9 F (36.6 C) (04/05 0813) Temp Source: Oral (04/05 0813) BP: 119/66 (04/05 0411) Pulse Rate: 86 (04/05 0411)  Labs: Recent Labs    03/23/19 0244 03/24/19 0246 03/25/19 0424  HGB 11.5*  --   --   HCT 35.3*  --   --   PLT 138*  --   --   CREATININE 2.99* 2.99* 3.18*    Estimated Creatinine Clearance: 21.5 mL/min (A) (by C-G formula based on SCr of 3.18 mg/dL (H)).   Medical History: Past Medical History:  Diagnosis Date  . Alcohol abuse   . Colon cancer ([place])   . COPD (chronic obstructive pulmonary disease) ([place])   . Emphysema of lung ([place])   . Former tobacco use   . Hypertension   . Peripheral edema   . Sepsis ([place]) 10/2016   Aspiration PNA/C diff colitis    Medications:  Medications Prior to Admission  Medication Sig Dispense Refill Last Dose  . acetaminophen (TYLENOL) 500 MG tablet Take 1,000 mg by mouth every 6 (six) hours as needed for fever.   03/02/2019 at Unknown time  . allopurinol (ZYLOPRIM) 100 MG tablet Take 100 mg by mouth daily.   03/02/2019 at am  . aspirin EC 81 MG tablet Take 81 mg by mouth daily.    03/02/2019 at am  . bisoprolol (ZEBETA) 5 MG tablet Take 5 mg by mouth daily.   03/02/2019 at 11a-12p  . ferrous sulfate 325 (65 FE) MG tablet Take 325 mg by mouth daily.    03/02/2019 at am  . furosemide (LASIX) 20 MG tablet Take 1 tablet (20 mg total) by mouth daily. 90 tablet 3 03/02/2019 at am  . HYDROcodone-homatropine (HYDROMET) 5-1.5 MG/5ML syrup Take 5 mLs by mouth every 6 (six) hours as needed for cough.   03/02/2019 at Unknown time  . Multiple Vitamin (MULTIVITAMIN WITH MINERALS) TABS tablet Take 1  tablet by mouth daily.   03/02/2019 at am  . oseltamivir (TAMIFLU) 75 MG capsule Take 75 mg by mouth 2 (two) times daily. 5 day course started 03/01/19 pm   03/02/2019 at pm  . predniSONE (DELTASONE) 10 MG tablet Take 10 mg by mouth daily.   03/02/2019 at am  . tiotropium (SPIRIVA) 18 MCG inhalation capsule Place 1 capsule (18 mcg total) into inhaler and inhale daily. 30 capsule 12 03/02/2019 at am    Assessment: 79 YOM who presented 03/03/19 with SOB found to have new AFib with CHADSVASc score 4. Pharmacy initially  consulted 03/07/19  to start rivaroxaban for stroke prevention, nonvalular afib. Xarelto 15mg  daily started 03/07/19, dose adjusted for renal dysfuction , SCr was 2.1.  AKI on CKD stage IV, SCr has steadily increased, SCr 3.18  today.  CrCl is 21 ml/min.  Xarelto 15 mg daily is still appropriate dose for CrCl 15-50 ml/min for nonvalvular atrial fibrillation.   Hgb 11.5,  no bleeding noted.   Goal of Therapy:  Primary stroke prevention Monitor platelets by anticoagulation protocol: Yes   Plan:  Continue Xarelto15 mg daily Monitor for renal function changes and for s/s of bleeding  Chigozie Basaldua A. Levada Dy, PharmD, [place] Please utilize Amion for appropriate  phone number to reach the unit pharmacist ([place])   03/25/2019 10:14 AM

## 2019-03-25 NOTE — Progress Notes (Signed)
Subjective: Interval History: has no complaint, agrees to f/u  Objective: Vital signs in last 24 hours: Temp:  [97.7 F (36.5 C)-98.5 F (36.9 C)] 97.9 F (36.6 C) (04/05 0813) Pulse Rate:  [86-95] 86 (04/05 0411) Resp:  [18-22] 20 (04/05 0411) BP: (111-151)/(55-72) 119/66 (04/05 0411) SpO2:  [82 %-97 %] 94 % (04/05 0915) Weight:  [103.4 kg] 103.4 kg (04/05 0642) Weight change: -1.9 kg  Intake/Output from previous day: 04/04 0701 - 04/05 0700 In: 740 [P.O.:740] Out: 2650 [Urine:2650] Intake/Output this shift: Total I/O In: 260 [P.O.:260] Out: -   General appearance: alert, cooperative, no distress, morbidly obese and pale Resp: diminished breath sounds bilaterally and rales bibasilar Cardio: irregularly irregular rhythm and systolic murmur: systolic ejection 2/6, crescendo and decrescendo at 2nd left intercostal space GI: obese, pos bs Extremities: edema 1-2+  Lab Results: Recent Labs    03/23/19 0244  WBC 11.9*  HGB 11.5*  HCT 35.3*  PLT 138*   BMET:  Recent Labs    03/24/19 0246 03/25/19 0424  NA 139 137  K 3.3* 3.8  CL 88* 89*  CO2 34* 33*  GLUCOSE 137* 126*  BUN 131* 128*  CREATININE 2.99* 3.18*  CALCIUM 8.3* 8.0*   No results for input(s): PTH in the last 72 hours. Iron Studies: No results for input(s): IRON, TIBC, TRANSFERRIN, FERRITIN in the last 72 hours.  Studies/Results: Dg Chest 2 View  Result Date: 03/24/2019 CLINICAL DATA:  Shortness of breath EXAM: CHEST - 2 VIEW COMPARISON:  Yesterday FINDINGS: Borderline heart size accentuated by fat pad. Small pleural effusions with adjacent lower lobe opacity, mildly improved. No Kerley lines. No pneumothorax. Large lung volumes in this patient with COPD. IMPRESSION: 1. Mildly improved small pleural effusions and lower lobe opacification 2. COPD. Electronically Signed   By: Monte Fantasia M.D.   On: 03/24/2019 07:47    I have reviewed the patient's current medications.  Assessment/Plan: 1 CKD4 Cr up  minimally. Vol improving. Lower dose Lasix.   2 Anemia stable 3 Pneu resolved 4 DM controlled 5 COPD 6Obesity 7 Afib rate control, anticoag 9 HPHT vit D P Lasix qd, vit D , will f/u outpatient with Dr. Ronald Lobo   LOS: 22 days   David Barton 03/25/2019,9:48 AM

## 2019-03-25 NOTE — Progress Notes (Signed)
PROGRESS NOTE    David Barton  XIP:[id number] DOB: June 28, 1933 DOA: 03/03/2019 PCP: Maury Dus, MD    Brief Narrative:  85 year old gentleman with history of COPD, emphysema, PSVT, essential hypertension, systolic heart failure with known ejection fraction of 30%, alcohol abuse, history of colon cancer who was admitted to the intensive care unit on 03/04/2019 with 4 days history of increasing shortness of breath, productive cough.  He was on Tamiflu empirically.  Patient was transferred to intensive care unit from [facility] on ventilator and treated as pneumonia and septic shock.  He has ultimately improved but remains on high flow oxygen.  Currently on medical floor.  Has persistent A. fib, underwent TEE and cardioversion with transient sinus rhythm now back to A. fib.  COVID-19 ruled out.  He has finished his antibiotic therapy.  Developed rapid A. fib new onset in the hospital.   Subjective:  Patient in bed, appears comfortable, denies any headache, no fever, no chest pain or pressure, minimal shortness of breath , no abdominal pain. No focal weakness.  Assessment & Plan:   Acute hypoxic respiratory failure secondary to chronic diastolic CHF EF preserved at 60% due to A. fib RVR due to Afib RVR: Initially suspicion was for community-acquired pneumonia and he has finished antibiotic course in ICU for tach, he was also checked for COVID-19 and was negative.  He has been aggressively diuresed and now BUN and creatinine has shot up although he still has third spacing and edema, some element of hypoalbuminemia causing third spacing of fluid and relative intravascular depletion. Renal on board and managing diuretics and fluids.  Already nephrology following nephrology is guiding fluid management and diuretic dosing.  Chest CT on 03/19/2019 noted and reviewed with pulmonary, left lower lobe collapse/consolidation.  Have added Mucomyst, chest PT and percussion along with deep suctioning.   Continue chest PT his shortness of breath and oxygen demand have gone down, continue diuresing along with chest PT , two-view chest x-ray on 03/24/2019 is much better, left lower lobe seems to be opening with chest PT and Mucomyst, continue on 03/24/2019.  Also oxygen demand is slowing down, case discussed with wife on 03/23/2019 likely discharge on 03/25/2019 if he continues to improve.    New onset A. fib with RVR [place] vas 2 score of at least 3: Echo noted, TSH stable.  Discussed with cardiologist he underwent second cardioversion attempt on 03/23/2019 which failed he remains in A. fib, continue combination of amiodarone, Xarelto and monitor.  Will go home on amiodarone 400 twice daily and then will be switched to 200 once a day on 04/07/2019.  COPD no wheezing.  No exacerbation: supportive care stop steroids.  Acute kidney injury with history of chronic kidney disease stage 4: His baseline creatinine 1.7, nephrology has placed him on once a day Lasix orally, nephrologist Dr. Jimmy Footman believe that this is patient's new baseline creatinine and renal function, case discussed with him on 03/23/2019.  Generalized deconditioning: We will continue to work with PT OT.  He has done some improvement now.  Hypomagnesemia.  replaced IV will recheck in the morning.    DVT prophylaxis: Xarelto. Code Status: DNR Family Communication: No family at bedside. Disposition Plan: Home versus acute rehab.   Consultants:   PCCM, signed off  Cardiology for A. Fib.  Nephrology  Procedures:   TEE with cardioversion.  2nd failed Cardioversion 03/23/19  Antimicrobials:   Rocephin and azithromycin, finished antibiotic therapy.  Objective: Vitals:   03/25/19  9622 03/25/19 0716 03/25/19 0718 03/25/19 0813  BP:      Pulse:      Resp:      Temp:    97.9 F (36.6 C)  TempSrc:    Oral  SpO2:  94% 92%   Weight: 103.4 kg     Height:        Intake/Output Summary (Last 24 hours) at 03/25/2019 0855 Last data filed  at 03/25/2019 2979 Gross per 24 hour  Intake 760 ml  Output 2650 ml  Net -1890 ml   Filed Weights   03/23/19 0654 03/24/19 0628 03/25/19 0642  Weight: 105.8 kg 105.3 kg 103.4 kg    Examination:  Awake Alert, Oriented X 3, No new F.N deficits, Normal affect [place].AT,PERRAL Supple Neck,No JVD, No cervical lymphadenopathy appriciated.  Symmetrical Chest wall movement, reduced left lower lobe breath sounds but better than yesterday iRRR,No Gallops, Rubs or new Murmurs, No Parasternal Heave +ve B.Sounds, Abd Soft, No tenderness, No organomegaly appriciated, No rebound - guarding or rigidity. No Cyanosis, Clubbing, +ve edema, No new Rash or bruise    Data Reviewed: I have personally reviewed following labs and imaging studies  CBC:  Recent Labs  Lab 03/23/19 0244  WBC 11.9*  HGB 11.5*  HCT 35.3*  MCV 94.9  PLT 892*   Basic Metabolic Panel:  Recent Labs  Lab 03/19/19 0543 03/20/19 0431 03/21/19 0235 03/22/19 0350 03/23/19 0244 03/24/19 0246 03/25/19 0424  NA 141 143 143 140 142 139 137  K 4.5 4.7 4.2 3.6 3.3* 3.3* 3.8  CL 96* 97* 97* 94* 90* 88* 89*  CO2 33* 35* 32 32 35* 34* 33*  GLUCOSE 168* 170* 128* 99 118* 137* 126*  BUN 124* 124* 134* 141* 135* 131* 128*  CREATININE 2.51* 2.65* 2.72* 2.79* 2.99* 2.99* 3.18*  CALCIUM 8.9 8.8* 9.0 8.5* 8.6* 8.3* 8.0*  MG 1.9 1.9  --   --   --   --  1.5*  PHOS  --   --  6.3* 6.3* 6.4* 5.6* 5.2*   GFR:  Estimated Creatinine Clearance: 21.5 mL/min (A) (by C-G formula based on SCr of 3.18 mg/dL (H)). Liver Function Tests: Recent Labs  Lab 03/21/19 0235 03/22/19 0350 03/23/19 0244 03/24/19 0246 03/25/19 0424  ALBUMIN 2.7* 2.6* 2.6* 2.4* 2.3*   No results for input(s): LIPASE, AMYLASE in the last 168 hours. No results for input(s): AMMONIA in the last 168 hours. Coagulation Profile: No results for input(s): INR, PROTIME in the last 168 hours. Cardiac Enzymes: No results for input(s): CKTOTAL, CKMB, CKMBINDEX, TROPONINI in  the last 168 hours. BNP (last 3 results) No results for input(s): PROBNP in the last 8760 hours. HbA1C: No results for input(s): HGBA1C in the last 72 hours. CBG: No results for input(s): GLUCAP in the last 168 hours. Lipid Profile: No results for input(s): CHOL, HDL, LDLCALC, TRIG, CHOLHDL, LDLDIRECT in the last 72 hours. Thyroid Function Tests: No results for input(s): TSH, T4TOTAL, FREET4, T3FREE, THYROIDAB in the last 72 hours. Anemia Panel: No results for input(s): VITAMINB12, FOLATE, FERRITIN, TIBC, IRON, RETICCTPCT in the last 72 hours. Sepsis Labs: No results for input(s): PROCALCITON, LATICACIDVEN in the last 168 hours.  No results found for this or any previous visit (from the past 240 hour(s)).   Radiology Studies: Dg Chest 2 View  Result Date: 03/24/2019 CLINICAL DATA:  Shortness of breath EXAM: CHEST - 2 VIEW COMPARISON:  Yesterday FINDINGS: Borderline heart size accentuated by fat pad. Small pleural effusions with adjacent  lower lobe opacity, mildly improved. No Kerley lines. No pneumothorax. Large lung volumes in this patient with COPD. IMPRESSION: 1. Mildly improved small pleural effusions and lower lobe opacification 2. COPD. Electronically Signed   By: Monte Fantasia M.D.   On: 03/24/2019 07:47    Scheduled Meds: . amiodarone  400 mg Oral BID  . arformoterol  15 mcg Nebulization BID  . budesonide (PULMICORT) nebulizer solution  0.5 mg Nebulization BID  . calcitRIOL  0.25 mcg Oral Daily  . diltiazem  120 mg Oral QHS  . docusate sodium  100 mg Oral BID  . feeding supplement (ENSURE ENLIVE)  237 mL Oral BID BM  . feeding supplement (PRO-STAT SUGAR FREE 64)  30 mL Oral TID WC  . furosemide  80 mg Oral Daily  . ipratropium  0.5 mg Nebulization BID  . mouth rinse  15 mL Mouth Rinse BID  . potassium chloride  20 mEq Oral Once  . rivaroxaban  15 mg Oral Q supper  . sodium chloride flush  3 mL Intravenous Q12H   Continuous Infusions: . sodium chloride    . magnesium  sulfate 1 - 4 g bolus IVPB       LOS: 22 days    Time spent: 25 minutes   Signature  Lala Lund M.D on 03/25/2019 at 8:55 AM   -  To page go to [email or web address]

## 2019-03-26 ENCOUNTER — Inpatient Hospital Stay (HOSPITAL_COMMUNITY): Payer: Medicare Other

## 2019-03-26 DIAGNOSIS — R0603 Acute respiratory distress: Secondary | ICD-10-CM

## 2019-03-26 LAB — RENAL FUNCTION PANEL
Albumin: 2.3 g/dL — ABNORMAL LOW (ref 3.5–5.0)
Anion gap: 13 (ref 5–15)
BUN: 112 mg/dL — ABNORMAL HIGH (ref 8–23)
CO2: 34 mmol/L — ABNORMAL HIGH (ref 22–32)
Calcium: 8.3 mg/dL — ABNORMAL LOW (ref 8.9–10.3)
Chloride: 90 mmol/L — ABNORMAL LOW (ref 98–111)
Creatinine, Ser: 3.14 mg/dL — ABNORMAL HIGH (ref 0.61–1.24)
GFR calc Af Amer: 20 mL/min — ABNORMAL LOW (ref >60)
GFR calc non Af Amer: 17 mL/min — ABNORMAL LOW (ref >60)
Glucose, Bld: 117 mg/dL — ABNORMAL HIGH (ref 70–99)
Phosphorus: 4.8 mg/dL — ABNORMAL HIGH (ref 2.5–4.6)
Potassium: 3.8 mmol/L (ref 3.5–5.1)
Sodium: 137 mmol/L (ref 135–145)

## 2019-03-26 LAB — MAGNESIUM: Magnesium: 2 mg/dL (ref 1.7–2.4)

## 2019-03-26 MED ORDER — IPRATROPIUM-ALBUTEROL 0.5-2.5 (3) MG/3ML IN SOLN: RESPIRATORY_TRACT | 0 refills | Status: DC

## 2019-03-26 MED ORDER — RIVAROXABAN 15 MG PO TABS: 15.0000 mg | ORAL_TABLET | Freq: Every day | ORAL | 0 refills | Status: DC

## 2019-03-26 MED ORDER — FUROSEMIDE 80 MG PO TABS: 80.0000 mg | ORAL_TABLET | Freq: Every day | ORAL | 0 refills | Status: DC

## 2019-03-26 MED ORDER — DILTIAZEM HCL ER COATED BEADS 120 MG PO CP24: 120.0000 mg | ORAL_CAPSULE | Freq: Every day | ORAL | 0 refills | Status: DC

## 2019-03-26 MED ORDER — AMIODARONE HCL 400 MG PO TABS: ORAL_TABLET | ORAL | 0 refills | Status: DC

## 2019-03-26 NOTE — Discharge Instructions (Signed)
Follow with Primary MD Maury Dus, MD in 7 days   Get CBC, CMP, 2 view Chest X ray -  checked  by Primary MD  in 5-7 days   Activity: As tolerated with Full fall precautions use walker/cane & assistance as needed  Disposition Home   Diet: Heart Healthy with 1.5 liter per day total fluid restriction.  Special Instructions: If you have smoked or chewed Tobacco  in the last 2 yrs please stop smoking, stop any regular Alcohol  and or any Recreational drug use.  On your next visit with your primary care physician please Get Medicines reviewed and adjusted.  Please request your Prim.MD to go over all Hospital Tests and Procedure/Radiological results at the follow up, please get all Hospital records sent to your Prim MD by signing hospital release before you go home.  If you experience worsening of your admission symptoms, develop shortness of breath, life threatening emergency, suicidal or homicidal thoughts you must seek medical attention immediately by calling 911 or calling your MD immediately  if symptoms less severe.  You Must read complete instructions/literature along with all the possible adverse reactions/side effects for all the Medicines you take and that have been prescribed to you. Take any new Medicines after you have completely understood and accpet all the possible adverse reactions/side effects.   Do not drive, operate heavy machinery, perform activities at heights, swimming or participation in water activities or provide baby sitting services if your were admitted for syncope or siezures until you have seen by Primary MD or a Neurologist and advised to do so again.      Information on my medicine - XARELTO (Rivaroxaban)  Why was Xarelto prescribed for you? Xarelto was prescribed for you to reduce the risk of a blood clot forming that can cause a stroke if you have a medical condition called atrial fibrillation (a type of irregular heartbeat).  What do you need to know  about xarelto ? Take your Xarelto ONCE DAILY at the same time every day with your evening meal. If you have difficulty swallowing the tablet whole, you may crush it and mix in applesauce just prior to taking your dose.  Take Xarelto exactly as prescribed by your doctor and DO NOT stop taking Xarelto without talking to the doctor who prescribed the medication.  Stopping without other stroke prevention medication to take the place of Xarelto may increase your risk of developing a clot that causes a stroke.  Refill your prescription before you run out.  After discharge, you should have regular check-up appointments with your healthcare provider that is prescribing your Xarelto.  In the future your dose may need to be changed if your kidney function or weight changes by a significant amount.  What do you do if you miss a dose? If you are taking Xarelto ONCE DAILY and you miss a dose, take it as soon as you remember on the same day then continue your regularly scheduled once daily regimen the next day. Do not take two doses of Xarelto at the same time or on the same day.   Important Safety Information A possible side effect of Xarelto is bleeding. You should call your healthcare provider right away if you experience any of the following: ? Bleeding from an injury or your nose that does not stop. ? Unusual colored urine (red or dark brown) or unusual colored stools (red or black). ? Unusual bruising for unknown reasons. ? A serious fall or if you hit  your head (even if there is no bleeding).  Some medicines may interact with Xarelto and might increase your risk of bleeding while on Xarelto. To help avoid this, consult your healthcare provider or pharmacist prior to using any new prescription or non-prescription medications, including herbals, vitamins, non-steroidal anti-inflammatory drugs (NSAIDs) and supplements.  This website has more information on Xarelto: [email or web address]

## 2019-03-26 NOTE — Progress Notes (Signed)
Inpatient Rehabilitation-Admissions Coordinator   Spoke to pt this AM at the bedside. He feels he is able to manage at home with home health. AC did speak with OT who confirmed he is appropriate for [facility] at this time. AC will sign off. CM updated on recommendation.   Please call if questions.   Jhonnie Garner, OTR/L  Rehab Admissions Coordinator  [phone] 03/26/2019 10:34 AM

## 2019-03-26 NOTE — Care Management Important Message (Signed)
Important Message  Patient Details  Name: David Barton MRN: [id number] Date of Birth: [birth year]   Medicare Important Message Given:  Yes    [name] 03/26/2019, 4:21 PM

## 2019-03-26 NOTE — Progress Notes (Signed)
Pt seen by MD, orders written for discharge. RN went over discharge instructions with pt and answered all questions. RN removed IV's. Escorted for discharge via wheelchair with all belongings. Pt will follow up outpatient with MD.

## 2019-03-26 NOTE — Progress Notes (Signed)
  [facility] Progress Note    Assessment/ Plan:   1 AKI on CKD4-- baseline creatinine appears to be 1.7.  Volume and creatinine improving.  Continue Lasix 80 mg daily.  I am arranging f/u with Dr. Moshe Cipro 2 Anemia stable 3 Pneu resolved- had been intubated and treated for septic shock. 4 DM controlled 5 COPD 6Obesity 7 Afib rate control, anticoag- with Xarelto.  Follow renal function closely. 9 HPHT vit D   Subjective:    For d/c today.     Objective:   BP 127/68 (BP Location: Left Arm)   Pulse 80   Temp 98.3 F (36.8 C) (Oral)   Resp 18   Ht 5\' 11"  (1.803 m)   Wt 102.6 kg   SpO2 97%   BMI 31.55 kg/m   Intake/Output Summary (Last 24 hours) at 03/26/2019 1242 Last data filed at 03/26/2019 9892 Gross per 24 hour  Intake 240 ml  Output 2700 ml  Net -2460 ml   Weight change: -0.8 kg  Physical Exam: [id number] well, sitting in chair CVS: irregular Resp: clear bilaterally Abd: soft Ext: 1+ LE edema, sig improved  Imaging: Dg Chest 2 View  Result Date: 03/26/2019 CLINICAL DATA:  Slight cough for a couple of weeks EXAM: CHEST - 2 VIEW COMPARISON:  Is two days ago FINDINGS: Normal heart size and mediastinal contours. Chronic interstitial coarsening with large lung volumes. Mild streaky density attributed atelectasis. Pleural effusions that are trace. IMPRESSION: 1. COPD with mild atelectasis. 2. Trace, decreased pleural effusions. Electronically Signed   By: Monte Fantasia M.D.   On: 03/26/2019 07:14    Labs: BMET Recent Labs  Lab 03/20/19 0431 03/21/19 0235 03/22/19 0350 03/23/19 0244 03/24/19 0246 03/25/19 0424 03/26/19 0433  NA 143 143 140 142 139 137 137  K 4.7 4.2 3.6 3.3* 3.3* 3.8 3.8  CL 97* 97* 94* 90* 88* 89* 90*  CO2 35* 32 32 35* 34* 33* 34*  GLUCOSE 170* 128* 99 118* 137* 126* 117*  BUN 124* 134* 141* 135* 131* 128* 112*  CREATININE 2.65* 2.72* 2.79* 2.99* 2.99* 3.18* 3.14*  CALCIUM 8.8* 9.0 8.5* 8.6* 8.3* 8.0* 8.3*  PHOS   --  6.3* 6.3* 6.4* 5.6* 5.2* 4.8*   CBC Recent Labs  Lab 03/23/19 0244  WBC 11.9*  HGB 11.5*  HCT 35.3*  MCV 94.9  PLT 138*    Medications:    . amiodarone  400 mg Oral BID  . arformoterol  15 mcg Nebulization BID  . budesonide (PULMICORT) nebulizer solution  0.5 mg Nebulization BID  . calcitRIOL  0.25 mcg Oral Daily  . diltiazem  120 mg Oral QHS  . docusate sodium  100 mg Oral BID  . feeding supplement (ENSURE ENLIVE)  237 mL Oral BID BM  . feeding supplement (PRO-STAT SUGAR FREE 64)  30 mL Oral TID WC  . furosemide  80 mg Oral Daily  . ipratropium  0.5 mg Nebulization BID  . mouth rinse  15 mL Mouth Rinse BID  . rivaroxaban  15 mg Oral Q supper  . sodium chloride flush  3 mL Intravenous Q12H      Madelon Lips, MD [facility] pgr [phone] 03/26/2019, 12:43 PM

## 2019-03-26 NOTE — Progress Notes (Signed)
Physical Therapy Note  SATURATION QUALIFICATIONS: (This note is used to comply with regulatory documentation for home oxygen)  Patient Saturations on Room Air at Rest = 89%  Patient Saturations on Room Air while Ambulating = 82%  Patient Saturations on 4-6 Liters of oxygen while Ambulating = 89%  Please briefly explain why patient needs home oxygen: Patient requires supplemental oxygen to maintain oxygen saturations at acceptable, safe levels with physical activity.  Full Treatment note to follow;   Roney Marion, [place] Pager [phone] Office [phone]

## 2019-03-26 NOTE — Progress Notes (Signed)
Physical Therapy Treatment Patient Details Name: David Barton MRN: [id number] DOB: 1933-05-25 Today's Date: 03/26/2019    History of Present Illness Pt is an 85 y/o male with PMH including but not limited to COPD, prior tobacco use, psvt, systolic hf ef 45-62% with normalization to 50's presenting with a four day history of increasing SOB with productive cough.  Flu swab negative at his physician's office on 3/12. Recovery complicated by afib with RVR now s/p cardioversion on 4/3 (unable to maintain sinus).    PT Comments    Continuing work on functional mobility and activity tolerance;  Showing significant progress, with decr supplemental O2 need than last week; still requires supplemental O2 for safe activity; Noted palns have cahnged to dc home today; PT in agreement   Follow Up Recommendations  Home health PT     Equipment Recommendations  Other (comment)(Oxygen)    Recommendations for Other Services       Precautions / Restrictions Precautions Precautions: Fall Precaution Comments: monitor SPO2 and HR    Mobility  Bed Mobility Overal bed mobility: Modified Independent                Transfers Overall transfer level: Needs assistance Equipment used: None Transfers: Sit to/from Stand Sit to Stand: Supervision            Ambulation/Gait Ambulation/Gait assistance: Min guard Gait Distance (Feet): 50 Feet Assistive device: Rolling walker (2 wheeled) Gait Pattern/deviations: Step-through pattern Gait velocity: decreased   General Gait Details: Cues to self-monitor for activity tolerance; see also other PT note of this date for O2 sat walking qualifications   Stairs             Wheelchair Mobility    Modified Rankin (Stroke Patients Only)       Balance Overall balance assessment: Needs assistance   Sitting balance-Leahy Scale: Good       Standing balance-Leahy Scale: Fair                              Cognition  Arousal/Alertness: Awake/alert Behavior During Therapy: WFL for tasks assessed/performed Overall Cognitive Status: Within Functional Limits for tasks assessed                                        Exercises Other Exercises Other Exercises: pursed lip breathing    General Comments        Pertinent Vitals/Pain Pain Assessment: No/denies pain Faces Pain Scale: Hurts a little bit Pain Location: back Pain Descriptors / Indicators: Discomfort Pain Intervention(s): Limited activity within patient's tolerance    Home Living                      Prior Function            PT Goals (current goals can now be found in the care plan section) Acute Rehab PT Goals Patient Stated Goal: Hopes to go home soon PT Goal Formulation: With patient Time For Goal Achievement: 03/23/19 Potential to Achieve Goals: Good Progress towards PT goals: Progressing toward goals    Frequency    Min 3X/week      PT Plan Discharge plan needs to be updated    Co-evaluation              AM-PAC PT "6 Clicks" Mobility   Outcome Measure  Help needed turning from your back to your side while in a flat bed without using bedrails?: None Help needed moving from lying on your back to sitting on the side of a flat bed without using bedrails?: None Help needed moving to and from a bed to a chair (including a wheelchair)?: A Little Help needed standing up from a chair using your arms (e.g., wheelchair or bedside chair)?: A Little Help needed to walk in hospital room?: A Little Help needed climbing 3-5 steps with a railing? : A Lot 6 Click Score: 19    End of Session Equipment Utilized During Treatment: Oxygen Activity Tolerance: Patient tolerated treatment well Patient left: in chair;with call bell/phone within reach Nurse Communication: Mobility status PT Visit Diagnosis: Unsteadiness on feet (R26.81);Other (comment)(decreased functional capacity)     Time:  6151-8343 PT Time Calculation (min) (ACUTE ONLY): 25 min  Charges:  $Gait Training: 23-37 mins                     David Barton, David Barton Pager [phone] Office [place] 03/26/2019, 1:24 PM

## 2019-03-26 NOTE — Progress Notes (Signed)
Occupational Therapy Treatment Patient Details Name: David Barton MRN: [id number] DOB: [birth year] Today's Date: 03/26/2019    History of present illness Pt is an 85 y/o male with PMH including but not limited to COPD, prior tobacco use, psvt, systolic hf ef 97-35% with normalization to 50's presenting with a four day history of increasing SOB with productive cough.  Flu swab negative at his physician's office on 3/12. Recovery complicated by afib with RVR now s/p cardioversion on 4/3 (unable to maintain sinus).   OT comments  PT has made significant progress. Able to complete ADL task @ sink level with SpO2 92 on 3L with 1/3 dyspnea. Educated pt on energy conservation/activity modification, pursed lip breathing and reducing risk of falls. Pt states he feels "much better". Feel pt is approriate to DC home with HHOT.   Follow Up Recommendations  Home health OT;Supervision - Intermittent    Equipment Recommendations  None recommended by OT    Recommendations for Other Services      Precautions / Restrictions Precautions Precautions: Fall Precaution Comments: monitor SPO2 and HR       Mobility Bed Mobility Overal bed mobility: Modified Independent                Transfers Overall transfer level: Needs assistance   Transfers: Sit to/from Stand Sit to Stand: Supervision              Balance Overall balance assessment: Needs assistance   Sitting balance-Leahy Scale: Good       Standing balance-Leahy Scale: Fair                             ADL either performed or assessed with clinical judgement   ADL Overall ADL's : Needs assistance/impaired     Grooming: Set up;Standing   Upper Body Bathing: Set up;Sitting   Lower Body Bathing: Min guard;Sit to/from stand   Upper Body Dressing : Minimal assistance Upper Body Dressing Details (indicate cue type and reason): due to ETC insufficiency Lower Body Dressing: Min guard;Sit to/from stand    Toilet Transfer: Supervision/safety;RW           Functional mobility during ADLs: Supervision/safety;Rolling walker;Cueing for safety General ADL Comments: Educated on energy conservation strategies and strategies to reduce risk of falls; educated on recommendation to use shower seat. Pt verbazlied understanding.     Vision       Perception     Praxis      Cognition Arousal/Alertness: Awake/alert Behavior During Therapy: WFL for tasks assessed/performed Overall Cognitive Status: Within Functional Limits for tasks assessed                                          Exercises Other Exercises Other Exercises: pursed lip breathing   Shoulder Instructions       General Comments      Pertinent Vitals/ Pain       Pain Assessment: Faces Faces Pain Scale: Hurts a little bit Pain Location: back Pain Descriptors / Indicators: Discomfort Pain Intervention(s): Limited activity within patient's tolerance  Home Living                                          Prior Functioning/Environment  Frequency  Min 3X/week        Progress Toward Goals  OT Goals(current goals can now be found in the care plan section)  Progress towards OT goals: Progressing toward goals  Acute Rehab OT Goals Patient Stated Goal: get stronger; so wants to walk in the hallway OT Goal Formulation: With patient Time For Goal Achievement: 03/29/19 Potential to Achieve Goals: Good ADL Goals Pt Will Perform Lower Body Bathing: with modified independence;sit to/from stand Pt Will Transfer to Toilet: with modified independence;ambulating Pt Will Perform Tub/Shower Transfer: with modified independence;ambulating Additional ADL Goal #1: Pt will independently verbalize 3 energy conservation strategies Additional ADL Goal #2: Pt will independently verbalize 3 strategies to prevent falls  Plan Discharge plan needs to be updated    Co-evaluation                  AM-PAC OT "6 Clicks" Daily Activity     Outcome Measure   Help from another person eating meals?: None Help from another person taking care of personal grooming?: None Help from another person toileting, which includes using toliet, bedpan, or urinal?: A Little Help from another person bathing (including washing, rinsing, drying)?: A Little Help from another person to put on and taking off regular upper body clothing?: A Little Help from another person to put on and taking off regular lower body clothing?: A Little 6 Click Score: 20    End of Session Equipment Utilized During Treatment: Rolling walker;Oxygen(3L)  OT Visit Diagnosis: Unsteadiness on feet (R26.81);Muscle weakness (generalized) (M62.81) Pain - part of body: (back)   Activity Tolerance Patient tolerated treatment well   Patient Left in chair;with call bell/phone within reach(working with PT)   Nurse Communication Mobility status        Time: 1011-1030 OT Time Calculation (min): 19 min  Charges: OT General Charges $OT Visit: 1 Visit OT Treatments $Self Care/Home Management : 8-22 mins  Maurie Boettcher, OT/L   Acute OT Clinical Specialist [place] Pager [phone] Office [phone]    [employer] 03/26/2019, 10:38 AM

## 2019-03-26 NOTE — Discharge Summary (Signed)
David Barton SRP:[id number] DOB: March 09, 1933 DOA: 03/03/2019  PCP: Maury Dus, MD  Admit date: 03/03/2019  Discharge date: 03/26/2019  Admitted From: Home   Disposition:  Home  Recommendations for Outpatient Follow-up:   Follow up with PCP in 1-2 weeks  PCP Please obtain BMP/CBC, 2 view CXR in 1week,  (see Discharge instructions)   PCP Please follow up on the following pending results: BMP & CXR closely   Home Health: PT,RN,Resp. Therapy, Aide Equipment/Devices: [employer], Home o2 Consultations: Renal, Cards, PCCM Discharge Condition: Fair   CODE STATUS: Full   Diet Recommendation: Heart Healthy 1.5 L/day fluid restriction  Diet Order            Diet 2 gram sodium Room service appropriate? Yes; Fluid consistency: Thin; Fluid restriction: 1200 mL Fluid  Diet effective now               Chief Complaint  Patient presents with   Blood Infection   Altered Mental Status     Brief history of present illness from the day of admission and additional interim summary    85 year old gentleman with history of COPD, emphysema, PSVT, essential hypertension, systolic heart failure with known ejection fraction of 30%, alcohol abuse, history of colon cancer who was admitted to the intensive care unit on 03/04/2019 with 4 days history of increasing shortness of breath, productive cough.  He was on Tamiflu empirically.  Patient was transferred to intensive care unit from [facility] on ventilator and treated as pneumonia and septic shock.  He has ultimately improved but remains on high flow oxygen.  Currently on medical floor.  Has persistent A. fib, underwent TEE and cardioversion with transient sinus rhythm now back to A. fib.  COVID-19 ruled out.  He has finished his antibiotic therapy.  Developed rapid A.  fib new onset in the hospital.                                                                 Hospital Course    Acute hypoxic respiratory failure secondary to chronic diastolic CHF EF preserved at 60% due to A. fib RVR due to Afib RVR: Initially suspicion was for community-acquired pneumonia and he has finished antibiotic course in ICU for tach, he was also checked for COVID-19 and was negative.  Seen by cardiology and nephrology and aggressively diuresed, he also had problems with atelectasis and mucous plugging causing left lower lobe collapse which improved after chest PT and Mucomyst treatments, he was initially using 10 L nasal cannula oxygen now down to 3 and symptom-free.  He will be discharged home with flutter valve and incentive spirometer, instructed to use it every hour while awake sitting up in the chair.  He will get home oxygen with nebulizer treatments and nebulizing  machine, requested to follow with PCP within a week.   New onset A. fib with RVR [place] vas 2 score of at least 3: Echo noted, TSH stable.  Discussed with cardiologist he underwent second cardioversion attempt on 03/23/2019 which failed he remains in A. fib, continue combination of amiodarone, Xarelto and monitor.  Will go home on amiodarone 400 twice daily and then will be switched to 200 once a day on 04/07/2019.  Xarelto dose has been adjusted by pharmacy.  PCP to monitor renal function and address anticoagulation as needed in the future.  COPD no wheezing.  No exacerbation: supportive care stop steroids.  Acute kidney injury with history of chronic kidney disease stage 4: His VS baseline creatinine 1.7, nephrology has placed him on once a day Lasix orally, nephrologist Dr. Jimmy Footman believe that this is patient's new baseline creatinine and renal function, case discussed with him on 03/23/2019.  Will be discharged on once daily oral Lasix with outpatient PCP and nephrology follow-up for monitoring of renal  function.  Generalized deconditioning: We will continue to work with PT OT.  He has done some improvement now.    Discharge diagnosis     Active Problems:   Acute respiratory failure ([name])   COPD with acute exacerbation ([place])   Essential hypertension   Community acquired pneumonia of right lung   Acute systolic CHF (congestive heart failure) ([name])   Pulmonary edema   Edema of both legs   Atrial fibrillation with RVR ([name])   Pressure injury of skin    Discharge instructions    Discharge Instructions    Discharge instructions   Complete by:  As directed    Follow with Primary MD Maury Dus, MD in 7 days   Get CBC, CMP, 2 view Chest X ray -  checked  by Primary MD  in 5-7 days   Activity: As tolerated with Full fall precautions use walker/cane & assistance as needed  Disposition Home   Diet: Heart Healthy with 1.5 liter per day total fluid restriction.  Special Instructions: If you have smoked or chewed Tobacco  in the last 2 yrs please stop smoking, stop any regular Alcohol  and or any Recreational drug use.  On your next visit with your primary care physician please Get Medicines reviewed and adjusted.  Please request your Prim.MD to go over all Hospital Tests and Procedure/Radiological results at the follow up, please get all Hospital records sent to your Prim MD by signing hospital release before you go home.  If you experience worsening of your admission symptoms, develop shortness of breath, life threatening emergency, suicidal or homicidal thoughts you must seek medical attention immediately by calling 911 or calling your MD immediately  if symptoms less severe.  You Must read complete instructions/literature along with all the possible adverse reactions/side effects for all the Medicines you take and that have been prescribed to you. Take any new Medicines after you have completely understood and accpet all the possible adverse reactions/side effects.   Do not  drive, operate heavy machinery, perform activities at heights, swimming or participation in water activities or provide baby sitting services if your were admitted for syncope or siezures until you have seen by Primary MD or a Neurologist and advised to do so again.   Increase activity slowly   Complete by:  As directed       Discharge Medications   Allergies as of 03/26/2019      Reactions   Flexeril [cyclobenzaprine] Other (See  Comments)   Unknown reaction       Medication List    STOP taking these medications   Hydromet 5-1.5 MG/5ML syrup Generic drug:  HYDROcodone-homatropine   oseltamivir 75 MG capsule Commonly known as:  TAMIFLU   predniSONE 10 MG tablet Commonly known as:  DELTASONE     TAKE these medications   acetaminophen 500 MG tablet Commonly known as:  TYLENOL Take 1,000 mg by mouth every 6 (six) hours as needed for fever.   allopurinol 100 MG tablet Commonly known as:  ZYLOPRIM Take 100 mg by mouth daily.   amiodarone 400 MG tablet Commonly known as:  PACERONE Take 400 mg twice daily for 10 days, on 11th day switch to 200 mg once daily   aspirin EC 81 MG tablet Take 81 mg by mouth daily.   bisoprolol 5 MG tablet Commonly known as:  ZEBETA Take 5 mg by mouth daily.   diltiazem 120 MG 24 hr capsule Commonly known as:  CARDIZEM CD Take 1 capsule (120 mg total) by mouth at bedtime.   ferrous sulfate 325 (65 FE) MG tablet Take 325 mg by mouth daily.   furosemide 80 MG tablet Commonly known as:  LASIX Take 1 tablet (80 mg total) by mouth daily. What changed:    medication strength  how much to take   ipratropium-albuterol 0.5-2.5 (3) MG/3ML Soln Commonly known as:  DUONEB Use twice a day scheduled and every 4 hours as needed for shortness of breath and wheezing   multivitamin with minerals Tabs tablet Take 1 tablet by mouth daily.   Rivaroxaban 15 MG Tabs tablet Commonly known as:  XARELTO Take 1 tablet (15 mg total) by mouth daily with  supper.   tiotropium 18 MCG inhalation capsule Commonly known as:  SPIRIVA Place 1 capsule (18 mcg total) into inhaler and inhale daily.            Durable Medical Equipment  (From admission, onward)         Start     Ordered   03/26/19 0900  For home use only DME oxygen  Once    Question Answer Comment  Mode or (Route) Nasal cannula   Liters per Minute 4   Frequency Continuous (stationary and portable oxygen unit needed)   Oxygen conserving device Yes   Oxygen delivery system Gas      03/26/19 0859   03/23/19 1105  For home use only DME Nebulizer/meds  Once    Question:  Patient needs a nebulizer to treat with the following condition  Answer:  CHF (congestive heart failure) ([place])   03/23/19 1105          Follow-up Information    Maury Dus, MD. Schedule an appointment as soon as possible for a visit in 1 week(s).   Specialty:  Family Medicine Contact information: [address] [place] [address] [phone]        Lorretta Harp, MD .   Specialties:  Cardiology, Radiology Contact information: [address] [place] [address] [phone]        Mauricia Area, MD. Schedule an appointment as soon as possible for a visit in 1 week(s).   Specialty:  Nephrology Contact information: [place] [address] [phone]        Koirala, Dibas, MD. Schedule an appointment as soon as possible for a visit in 1 week(s).   Specialty:  Family Medicine Contact information: [place]  [address] [place] [address] [phone]           Major procedures and Radiology Reports - PLEASE review detailed and final reports thoroughly  -        TEE with cardioversion.  2nd failed Cardioversion 03/23/19  Echo   1. The left ventricle has normal systolic function, with an ejection fraction of 55-60%. The cavity size was normal. Left ventricular diastolic function could not be evaluated  secondary to atrial fibrillation.  2. The right ventricle has normal systolic function. The cavity was normal. There is no increase in right ventricular wall thickness.  3. Right atrial size was mildly dilated.  4. The mitral valve is grossly normal. Mild thickening of the mitral valve leaflet.  5. The aortic valve is tricuspid Mild thickening of the aortic valve Mild calcification of the aortic valve. no stenosis of the aortic valve.  6. Incomplete echo.  7. Technically difficult images.       8. The interatrial septum was not assessed.   Dg Chest 2 View  Result Date: 03/26/2019 CLINICAL DATA:  Slight cough for a couple of weeks EXAM: CHEST - 2 VIEW COMPARISON:  Is two days ago FINDINGS: Normal heart size and mediastinal contours. Chronic interstitial coarsening with large lung volumes. Mild streaky density attributed atelectasis. Pleural effusions that are trace. IMPRESSION: 1. COPD with mild atelectasis. 2. Trace, decreased pleural effusions. Electronically Signed   By: Monte Fantasia M.D.   On: 03/26/2019 07:14   Dg Chest 2 View  Result Date: 03/24/2019 CLINICAL DATA:  Shortness of breath EXAM: CHEST - 2 VIEW COMPARISON:  Yesterday FINDINGS: Borderline heart size accentuated by fat pad. Small pleural effusions with adjacent lower lobe opacity, mildly improved. No Kerley lines. No pneumothorax. Large lung volumes in this patient with COPD. IMPRESSION: 1. Mildly improved small pleural effusions and lower lobe opacification 2. COPD. Electronically Signed   By: Monte Fantasia M.D.   On: 03/24/2019 07:47   Dg Chest 2 View  Result Date: 03/23/2019 CLINICAL DATA:  Shortness of breath, cough EXAM: CHEST - 2 VIEW COMPARISON:  03/21/2019 FINDINGS: Small to moderate layering bilateral effusions. Bibasilar atelectasis or infiltrates. Mild cardiomegaly. No overt edema. No acute bony abnormality. IMPRESSION: Layering bilateral effusions with bibasilar atelectasis or infiltrates. Electronically Signed   By:  Rolm Baptise M.D.   On: 03/23/2019 07:29   Dg Chest 2 View  Result Date: 03/21/2019 CLINICAL DATA:  Shortness of breath EXAM: CHEST - 2 VIEW COMPARISON:  03/14/2029, CT from 03/19/2019 FINDINGS: Cardiac shadow is within normal limits. Persistent left lower lobe consolidation is noted stable from the prior CT examination. No other significant infiltrate is seen. No significant vascular congestion is noted. No bony abnormality is seen. IMPRESSION: Persistent left lower lobe consolidation. Electronically Signed   By: Inez Catalina M.D.   On: 03/21/2019 08:34   Ct Chest Wo Contrast  Result Date: 03/19/2019 CLINICAL DATA:  Chronic dyspnea. EXAM: CT CHEST WITHOUT CONTRAST TECHNIQUE: Multidetector CT imaging of the chest was performed following the standard protocol without IV contrast. COMPARISON:  Radiograph March 15, 2019. CT scan of November 03, 2016. FINDINGS: Cardiovascular: Atherosclerosis of thoracic aorta is noted without aneurysm formation. Normal cardiac size. No pericardial effusion. Mild coronary artery calcifications are noted. Mediastinum/Nodes: No enlarged mediastinal or axillary lymph nodes. Thyroid gland, trachea, and esophagus demonstrate no significant findings. Lungs/Pleura: No pneumothorax is noted. Emphysematous disease is noted in both lungs. Complete consolidation of left lower lobe is noted most consistent with atelectasis  with small amount of surrounding pleural fluid. Endobronchial obstruction can not be excluded. Right lower lobe pneumonia or atelectasis is noted. Small right pleural effusion is noted. Minimal subsegmental atelectasis is noted posteriorly in the right lower lobe which is decreased compared to prior exam. Upper Abdomen: No acute abnormality. Musculoskeletal: No chest wall mass or suspicious bone lesions identified. IMPRESSION: Complete consolidation of left lower lobe is noted consistent with atelectasis or possibly pneumonia, with small associated left pleural effusion;  endobronchial obstruction can not be excluded. Mild right lower lobe opacity is noted concerning for pneumonia or atelectasis with small right pleural effusion. Mild coronary artery calcifications are noted. Aortic Atherosclerosis (ICD10-I70.0) and Emphysema (ICD10-J43.9). Electronically Signed   By: Marijo Conception, M.D.   On: 03/19/2019 10:17   US Renal  Result Date: 03/04/2019 CLINICAL DATA:  Acute renal failure EXAM: RENAL / URINARY TRACT ULTRASOUND COMPLETE COMPARISON:  None. FINDINGS: Right Kidney: Renal measurements: 11.1 x 6.3 x 6.2 cm = volume: 230.8 mL . Echogenicity within normal limits. No mass or hydronephrosis visualized. Left Kidney: Renal measurements: 10.6 x 5.8 x 5.6 cm = volume: 154.3 mL. Echogenicity within normal limits. No mass or hydronephrosis visualized. Bladder: Decompressed with Foley catheter. IMPRESSION: No hydronephrosis. Electronically Signed   By: Lovey Newcomer M.D.   On: 03/04/2019 14:43   Dg Chest Port 1 View  Result Date: 03/15/2019 CLINICAL DATA:  Tachycardia. Hypertension. History of COPD and hypertension. EXAM: PORTABLE CHEST 1 VIEW COMPARISON:  03/09/2019; 03/05/2019; 03/03/2019 FINDINGS: Grossly unchanged cardiac silhouette and mediastinal contours atherosclerotic plaque within thoracic aorta. The pulmonary vasculature remains indistinct with cephalization. Unchanged small layering effusions and associated bibasilar heterogeneous/consolidative opacities, left greater right. No new focal airspace opacities. No pneumothorax. No acute osseous abnormalities. IMPRESSION: Similar findings of cardiomegaly, pulmonary edema, small bilateral effusions and associated bibasilar opacities, atelectasis versus infiltrate. Electronically Signed   By: Sandi Mariscal M.D.   On: 03/15/2019 11:03   Dg Chest Port 1 View  Result Date: 03/09/2019 CLINICAL DATA:  Shortness of breath. EXAM: PORTABLE CHEST 1 VIEW COMPARISON:  03/05/2019. FINDINGS: Cardiomegaly with diffuse bilateral pulmonary  infiltrates/edema and bilateral pleural effusions. No pneumothorax. IMPRESSION: Cardiomegaly with diffuse bilateral pulmonary infiltrates/edema and bilateral pleural effusions. Similar findings noted on prior exam. Findings consistent with CHF. Electronically Signed   By: Marcello Moores  Register   On: 03/09/2019 12:03   Dg Chest Port 1 View  Result Date: 03/05/2019 CLINICAL DATA:  Acute renal injury EXAM: PORTABLE CHEST 1 VIEW COMPARISON:  03/03/2019 FINDINGS: Cardiac shadow is enlarged but stable. Endotracheal tube and nasogastric catheter have been removed in the interval. Bilateral pleural effusions are noted slightly increased from the prior exam although this may be in part positional in nature. Mild bibasilar atelectasis is noted. IMPRESSION: Mild bibasilar atelectasis and bilateral pleural effusions. Electronically Signed   By: Inez Catalina M.D.   On: 03/05/2019 07:04   Dg Chest Port 1 View  Result Date: 03/03/2019 CLINICAL DATA:  Intubated. EXAM: PORTABLE CHEST 1 VIEW COMPARISON:  Earlier same day FINDINGS: Endotracheal tube tip is 4 cm above the carina. Nasogastric tube enters the abdomen. There is abnormal density in both lower lobes consistent with atelectasis and or pneumonia. Possibility of acute edema does exist. IMPRESSION: New and probably worsening lower lung density which could be due to pneumonia or acute edema. Endotracheal tube and nasogastric tube satisfactory as seen. Electronically Signed   By: Nelson Chimes M.D.   On: 03/03/2019 12:46   Dg Chest [facility]  Result Date: 03/03/2019 CLINICAL DATA:  Fever, cough, shortness of breath EXAM: PORTABLE CHEST 1 VIEW COMPARISON:  11/17/2016 FINDINGS: Multifocal patchy opacities in the lingula, left lower lobe, and right middle lobe. This appearance suggests multifocal pneumonia. No pleural effusion or pneumothorax. Mild cardiomegaly. IMPRESSION: Multifocal patchy opacities, suggesting multifocal pneumonia. Electronically Signed   By: Julian Hy M.D.   On: 03/03/2019 10:28   Dg Abd Portable 1 View  Result Date: 03/03/2019 CLINICAL DATA:  Orogastric placement EXAM: PORTABLE ABDOMEN - 1 VIEW COMPARISON:  None. FINDINGS: Orogastric tube enters the stomach with its tip in the midportion. IMPRESSION: Orogastric tube tip in the mid body of the stomach. Electronically Signed   By: Nelson Chimes M.D.   On: 03/03/2019 12:46    Micro Results     No results found for this or any previous visit (from the past 240 hour(s)).  Today   Subjective    Gad Aymond today has no headache,no chest abdominal pain,no new weakness tingling or numbness, feels much better wants to go home today.    Objective   Blood pressure 127/68, pulse 80, temperature 98.3 F (36.8 C), temperature source Oral, resp. rate 18, height 5\' 11"  (1.803 m), weight 102.6 kg, SpO2 97 %.   Intake/Output Summary (Last 24 hours) at 03/26/2019 0909 Last data filed at 03/26/2019 0500 Gross per 24 hour  Intake 240 ml  Output 2050 ml  Net -1810 ml    Exam Awake Alert, Oriented x 3, No new F.N deficits, Normal affect [place].AT,PERRAL Supple Neck,No JVD, No cervical lymphadenopathy appriciated.  Symmetrical Chest wall movement, Good air movement bilaterally, CTAB RRR,No Gallops,Rubs or new Murmurs, No Parasternal Heave +ve B.Sounds, Abd Soft, Non tender, No organomegaly appriciated, No rebound -guarding or rigidity. No Cyanosis, Clubbing or edema, No new Rash or bruise   Data Review   CBC w Diff:  Lab Results  Component Value Date   WBC 11.9 (H) 03/23/2019   HGB 11.5 (L) 03/23/2019   HGB 14.1 12/21/2018   HGB 14.9 11/24/2009   HCT 35.3 (L) 03/23/2019   HCT 36.6 (L) 03/10/2019   HCT 43.8 11/24/2009   PLT 138 (L) 03/23/2019   PLT 276 12/21/2018   LYMPHOPCT 1 03/03/2019   LYMPHOPCT 40.5 11/24/2009   MONOPCT 13 03/03/2019   MONOPCT 11.6 11/24/2009   EOSPCT 0 03/03/2019   EOSPCT 3.6 11/24/2009   BASOPCT 0 03/03/2019   BASOPCT 0.5 11/24/2009    CMP:  Lab  Results  Component Value Date   NA 137 03/26/2019   NA 139 12/21/2018   K 3.8 03/26/2019   CL 90 (L) 03/26/2019   CO2 34 (H) 03/26/2019   BUN 112 (H) 03/26/2019   BUN 35 (H) 12/21/2018   CREATININE 3.14 (H) 03/26/2019   PROT 5.1 (L) 03/17/2019   PROT 6.8 12/21/2018   ALBUMIN 2.3 (L) 03/26/2019   ALBUMIN 3.7 12/21/2018   BILITOT 0.6 03/17/2019   BILITOT 0.5 12/21/2018   ALKPHOS 51 03/17/2019   AST 20 03/17/2019   ALT 20 03/17/2019  .   Total Time in preparing paper work, data evaluation and todays exam - 15 minutes  Lala Lund M.D on 03/26/2019 at 9:09 AM  [facility]   Office  [phone]

## 2019-03-26 NOTE — Progress Notes (Signed)
Progress Note  Patient Name: David Barton Date of Encounter: 03/26/2019  Primary Cardiologist: Quay Burow, MD   Subjective   Breathing is Ok   NO CP    Inpatient Medications    Scheduled Meds: . amiodarone  400 mg Oral BID  . arformoterol  15 mcg Nebulization BID  . budesonide (PULMICORT) nebulizer solution  0.5 mg Nebulization BID  . calcitRIOL  0.25 mcg Oral Daily  . diltiazem  120 mg Oral QHS  . docusate sodium  100 mg Oral BID  . feeding supplement (ENSURE ENLIVE)  237 mL Oral BID BM  . feeding supplement (PRO-STAT SUGAR FREE 64)  30 mL Oral TID WC  . furosemide  80 mg Oral Daily  . ipratropium  0.5 mg Nebulization BID  . mouth rinse  15 mL Mouth Rinse BID  . rivaroxaban  15 mg Oral Q supper  . sodium chloride flush  3 mL Intravenous Q12H   Continuous Infusions: . sodium chloride     PRN Meds: alum & mag hydroxide-simeth, hydrocortisone, hydrocortisone cream, levalbuterol, lip balm, loratadine, Muscle Rub, phenol, polyethylene glycol, polyvinyl alcohol, senna-docusate, sodium chloride, sodium chloride flush   Vital Signs    Vitals:   03/25/19 2257 03/25/19 2300 03/26/19 0045 03/26/19 0419  BP: 131/89 131/89  127/68  Pulse:    80  Resp: 18 18  18   Temp: 98.8 F (37.1 C) 98.8 F (37.1 C) 99 F (37.2 C) 98.3 F (36.8 C)  TempSrc: Oral Oral Oral Oral  SpO2:  93%  94%  Weight:      Height:        Intake/Output Summary (Last 24 hours) at 03/26/2019 0742 Last data filed at 03/26/2019 0500 Gross per 24 hour  Intake 500 ml  Output 2050 ml  Net -1550 ml   Last 3 Weights 03/25/2019 03/24/2019 03/23/2019  Weight (lbs) 227 lb 15.3 oz 232 lb 2.3 oz 233 lb 3.2 oz  Weight (kg) 103.4 kg 105.3 kg 105.779 kg      Telemetry    Afib   80s  ersonally Reviewed   Physical Exam   GEN: WD  WN NAD Neck: Supple, no JVD Cardiac: irregular, no murmur Respiratory: No rhonchi GI: Soft, NT/ND, no masses MS: triv to 1+ edema Neuro:  No focal findings  Labs     Chemistry Recent Labs  Lab 03/24/19 0246 03/25/19 0424 03/26/19 0433  NA 139 137 137  K 3.3* 3.8 3.8  CL 88* 89* 90*  CO2 34* 33* 34*  GLUCOSE 137* 126* 117*  BUN 131* 128* 112*  CREATININE 2.99* 3.18* 3.14*  CALCIUM 8.3* 8.0* 8.3*  ALBUMIN 2.4* 2.3* 2.3*  GFRNONAA 18* 17* 17*  GFRAA 21* 20* 20*  ANIONGAP 17* 15 13     Hematology Recent Labs  Lab 03/23/19 0244  WBC 11.9*  RBC 3.72*  HGB 11.5*  HCT 35.3*  MCV 94.9  MCH 30.9  MCHC 32.6  RDW 16.2*  PLT 138*    BNP Recent Labs  Lab 03/20/19 0431  BNP 191.2*    Patient Profile     85 year old male with past medical history of diastolic congestive heart failure, supraventricular tachycardia, hypertension, colon cancer, COPD admitted with pneumonia who we are asked to evaluate for atrial fibrillation and acute on chronic diastolic congestive heart failure.  Patient admitted March 15 with pneumonia requiring intubation.  He has slowly improved.  He developed atrial fibrillation with rapid ventricular response and has been treated with Cardizem.  He developed worsening edema requiring diuresis and subsequently progressive renal insuff.  Cardiology asked to evaluate.   Echocardiogram shows normal LV function. S/p TEE DCCV 3/27. Reverted to atrial fibrillation.   Assessment & Plan    1 new onset atrial fibrillation-patient underwent repeat cardioversion  He held sinus for 3 minutes and then returned to atrial fibrillation.   Plan will be rate control and anticoagulation for now.  Continue present dose of Cardizem.  Continue amiodarone 400 mg twice daily to complete 2 weeks then decrease to 200 mg daily thereafter.   Reattempt at cardioversion in 8 to 12 weeks.  If he does not hold sinus rhythm, then would plan rate control and anticoagulation long-term. .  Continue xarelto.   2 acute on chronic diastolic congestive heart failure-I/O -1327.  Volume status improved. Lasix per nephrology.  VOlume is not bad      3 acute  on chronic stage III kidney disease-  Renal following   4 status post pneumonia-Per primary care.   Note plans for d/c   WIll make sure he has f/u virtual visiits  For questions or updates, please contact [place] Please consult [email or web address] for contact info under        Signed, Dorris Carnes, MD  03/26/2019, 7:42 AM

## 2019-03-26 NOTE — Progress Notes (Cosign Needed)
    Durable Medical Equipment  (From admission, onward)         Start     Ordered   03/26/19 1036  For home use only DME lightweight manual wheelchair with seat cushion  Once    Comments:  Patient suffers from weakness which impairs their ability to perform daily activities like walking in the home.  A rolling will not resolve  issue with performing activities of daily living. A wheelchair will allow patient to safely perform daily activities. Patient is not able to propel themselves in the home using a standard weight wheelchair due to weakness. Patient can self propel in the lightweight wheelchair.  Accessories: elevating leg rests (ELRs), wheel locks, extensions and anti-tippers. Pt will need seat and back cushion.   03/26/19 1038   03/26/19 0900  For home use only DME oxygen  Once    Question Answer Comment  Mode or (Route) Nasal cannula   Liters per Minute 4   Frequency Continuous (stationary and portable oxygen unit needed)   Oxygen conserving device Yes   Oxygen delivery system Gas      03/26/19 0859   03/23/19 1105  For home use only DME Nebulizer/meds  Once    Question:  Patient needs a nebulizer to treat with the following condition  Answer:  CHF (congestive heart failure) ([place])   03/23/19 1105

## 2019-03-26 NOTE — TOC Transition Note (Addendum)
Transition of Care [facility]) - CM/SW Discharge Note   Patient Details  Name: David Barton MRN: [id number] Date of Birth: 1933/08/29  [facility] CM/SW Contact:  Sharin Mons, RN Phone Number: 03/26/2019, 10:46 AM   Clinical Narrative:    Pt to transition to home today, home health services to follow. [facility] will provide home health services...start of care to begin with 48 hrs.  Referral made for DME home oxygen with [employer]. Oxygen will be delivered prior to d/c pending walking O2 sats.  NCM faxed Rx meds to [place] , store [phone], @ [phone]. Please give hard copies to wife @ d/c. Wife to provide transportation to home.     03/27/2019 @ 1542  Per [employer] liaision portable oxygen tanks delivered to pt's bedside for transport to home. [employer] to f/u with pt once home to deliver and complete setup with oxygen concentrator, pt and wife made aware. NCM provided wife with [employer]'s contact # [phone] for concerns/questions.  Final next level of care: [place] Barriers to Discharge: Barriers Resolved   Patient Goals and CMS Choice Patient states their goals for this hospitalization and ongoing recovery are:: Rehab   Choice offered to / list presented to : Patient, Spouse  Discharge Placement                       Discharge Plan and Services In-house Referral: Clinical Social Work   Post Acute Care Choice: Home Health          DME Arranged: Oxygen(declined W/C, OWNS Vassie Moselle) DME Agency: [employer] HH Arranged: RN, PT, OT, Social Work, Nurse's Aide [place] Agency: [place] ([facility])   Social Determinants of Health (SDOH) Interventions     Readmission Risk Interventions Readmission Risk Prevention Plan 03/20/2019  Transportation Screening Complete  PCP or Specialist Appt within 5-7 Days Complete  Home Care Screening Complete  Medication Review (RN CM) Complete  Some recent data  might be hidden

## 2019-04-10 ENCOUNTER — Ambulatory Visit
Admission: RE | Admit: 2019-04-10 | Discharge: 2019-04-10 | Disposition: A | Discharge status: Home / Self Care | Payer: Medicare Other | Source: Ambulatory Visit | Attending: Family Medicine | Admitting: Family Medicine

## 2019-04-10 ENCOUNTER — Other Ambulatory Visit: Payer: Self-pay

## 2019-04-10 ENCOUNTER — Other Ambulatory Visit: Payer: Self-pay | Admitting: Family Medicine

## 2019-04-10 DIAGNOSIS — I5033 Acute on chronic diastolic (congestive) heart failure: Secondary | ICD-10-CM

## 2019-04-16 ENCOUNTER — Telehealth: Payer: Self-pay | Admitting: Cardiology

## 2019-04-16 NOTE — Telephone Encounter (Signed)
smartphone [phone] consent/ my chart/ pre reg completed

## 2019-04-16 NOTE — Telephone Encounter (Signed)
New Message   Pts wife is calling about his office visit appt  He had lab work done and she wants to make sure the office has that when they come. She also said the pt is on oxygen and the tank will only last one hour while they're out so she's wondering how long he will be out for.     Please call back

## 2019-04-16 NOTE — Telephone Encounter (Signed)
Reroute

## 2019-04-16 NOTE — Telephone Encounter (Signed)
Do we have oxygen in the office?  If the patient's home O2 can only last an hour I am not sure that we will be able to see him unless we have oxygen in the office to replenish his home O2 supply.

## 2019-04-16 NOTE — Telephone Encounter (Signed)
Follow up  Patient's wife is returning call per the previous message. Please call the patient to discuss.

## 2019-04-17 ENCOUNTER — Telehealth (INDEPENDENT_AMBULATORY_CARE_PROVIDER_SITE_OTHER): Payer: Medicare Other | Admitting: Cardiology

## 2019-04-17 ENCOUNTER — Ambulatory Visit: Payer: Medicare Other | Admitting: Cardiology

## 2019-04-17 ENCOUNTER — Encounter: Payer: Self-pay | Admitting: Cardiology

## 2019-04-17 ENCOUNTER — Telehealth: Payer: Medicare Other | Admitting: Cardiovascular Disease

## 2019-04-17 ENCOUNTER — Telehealth: Payer: Self-pay

## 2019-04-17 VITALS — BP 129/68 | HR 81 | Ht 71.0 in | Wt 225.0 lb

## 2019-04-17 DIAGNOSIS — N179 Acute kidney failure, unspecified: Secondary | ICD-10-CM

## 2019-04-17 DIAGNOSIS — J441 Chronic obstructive pulmonary disease with (acute) exacerbation: Secondary | ICD-10-CM

## 2019-04-17 DIAGNOSIS — Z9981 Dependence on supplemental oxygen: Secondary | ICD-10-CM

## 2019-04-17 DIAGNOSIS — J9601 Acute respiratory failure with hypoxia: Secondary | ICD-10-CM

## 2019-04-17 DIAGNOSIS — I4891 Unspecified atrial fibrillation: Secondary | ICD-10-CM | POA: Diagnosis not present

## 2019-04-17 DIAGNOSIS — N189 Chronic kidney disease, unspecified: Secondary | ICD-10-CM

## 2019-04-17 DIAGNOSIS — Z862 Personal history of diseases of the blood and blood-forming organs and certain disorders involving the immune mechanism: Secondary | ICD-10-CM

## 2019-04-17 DIAGNOSIS — I5021 Acute systolic (congestive) heart failure: Secondary | ICD-10-CM

## 2019-04-17 DIAGNOSIS — J189 Pneumonia, unspecified organism: Secondary | ICD-10-CM

## 2019-04-17 MED ORDER — AMIODARONE HCL 200 MG PO TABS: 200.0000 mg | ORAL_TABLET | Freq: Every day | ORAL | 0 refills | Status: DC

## 2019-04-17 NOTE — Telephone Encounter (Signed)
Spouse is update that no one told her this was a virtual visit. She has a bunch of questions. I will send wife's concerns(recent labs, Xrays, Afib, and CHF) to [facility]

## 2019-04-17 NOTE — Patient Instructions (Addendum)
Medication Instructions:  STOP Aspirin  DECREASE Pacerone to 200mg  Take 1 tablet once a day  If you need a refill on your cardiac medications before your next appointment, please call your pharmacy.   Lab work: None  If you have labs (blood work) drawn today and your tests are completely normal, you will receive your results only by: Marland Kitchen MyChart Message (if you have MyChart) OR . A paper copy in the mail If you have any lab test that is abnormal or we need to change your treatment, we will call you to review the results.  Testing/Procedures: None   Follow-Up: At [facility], you and your health needs are our priority.  As part of our continuing mission to provide you with exceptional heart care, we have created designated Provider Care Teams.  These Care Teams include your primary Cardiologist (physician) and Advanced Practice Providers (APPs -  Physician Assistants and Nurse Practitioners) who all work together to provide you with the care you need, when you need it. You will need a follow up appointment in 2 months in office with Dr Gwenlyn Found. You may see Quay Burow, MD or one of the following Advanced Practice Providers on your designated Care Team:   Kerin Ransom, PA-C Roby Lofts, [place] . Sande Rives, PA-C  Any Other Special Instructions Will Be Listed Below (If Applicable). You have been referred to STAT [facility].

## 2019-04-17 NOTE — Telephone Encounter (Signed)
I will contact patient I think they are confused and think they have to come to the office.

## 2019-04-17 NOTE — Telephone Encounter (Signed)
Contacted patient and discussed AVS instructions. Urgent message sent to schedulers in regards to patiens urgent pulmonary referral. Follow up appt scheduled for Dr Gwenlyn Found in office on his DOD day. Patient voiced understanding.

## 2019-04-17 NOTE — Progress Notes (Signed)
Virtual Visit via Telephone Note   This visit type was conducted due to national recommendations for restrictions regarding the COVID-19 Pandemic (e.g. social distancing) in an effort to limit this patient's exposure and mitigate transmission in our community.  Due to his co-morbid illnesses, this patient is at least at moderate risk for complications without adequate follow up.  This format is felt to be most appropriate for this patient at this time.  The patient did not have access to video technology/had technical difficulties with video requiring transitioning to audio format only (telephone).  All issues noted in this document were discussed and addressed.  No physical exam could be performed with this format.  Please refer to the patient's chart for his  consent to telehealth for [facility].  Evaluation Performed:  Follow-up visit  This visit type was conducted due to national recommendations for restrictions regarding the COVID-19 Pandemic (e.g. social distancing).  This format is felt to be most appropriate for this patient at this time.  All issues noted in this document were discussed and addressed.  No physical exam was performed (except for noted visual exam findings with Video Visits).  Please refer to the patient's chart (MyChart message for video visits and phone note for telephone visits) for the patient's consent to telehealth for [facility].  Date:  04/17/2019   ID:  David Barton, DOB October 29, 1933, MRN [id number]  Patient Location: Home [name]   Provider location:   [place]  PCP:  Maury Dus, MD  Cardiologist:  Quay Burow, MD  Electrophysiologist:  None   Chief Complaint:  Post hospital follow up  History of Present Illness:    David Barton is a 85 y.o. male who presents via audio/video conferencing for a telehealth visit today.  Patient has a history of prior cardiomyopathy in the setting of sepsis in 2018.   This subsequently recovered.  He was recently admitted to the hospital March 04, 2019 with acute respiratory failure felt to be secondary to community-acquired pneumonia.  During his hospitalization he had atrial fibrillation with rapid ventricular response, acute diastolic heart failure, and acute on chronic renal insufficiency.  The patient had 2 attempted cardioversions, both failed.  He was placed on amiodarone loading with plans for another attempted cardioversion in 8 to 12 weeks.  Xarelto at renal dose was also added.  TEE done March 27 showed his ejection fraction to be 60 to 65% with mild left atrial enlargement.  His serum creatinine on admission was 4.6, at discharge 3.14.  The patient has seen Dr. Vanetta Mulders in follow-up.  Labs done on April 21 showed a serum creatinine of 3.64 and a hemoglobin of 9.  According to the patient's wife it sounds like the patient is to start erythropoietin as an outpatient.  He was also supplied stool guaiac cards.  The patient reports she does not have melena, his stool is normal color.  He has had some problems with easy bruising and a couple episodes of nosebleeds, he was sent home on oxygen. A CXR was done 4/21 2020 that showed Airspace and interstitial densities in the upper lungs, particularly in the right lung. Findings were most compatible with pneumonia. Dr Alyson Ingles has started him on OP ABs.  Symptomatically the patient feels well.  He says he definitely feels stronger than he did when he went home from the hospital.  His appetite is improving.  He is not had fever or chills.  They  have cut his amiodarone back to 200 mg a day as directed.  His heart rate is 80.  The patient does not symptoms concerning for COVID-19 infection (fever, chills, cough, or new SHORTNESS OF BREATH).    Prior CV studies:   The following studies were reviewed today TEE 03/18/2019  Past Medical History:  Diagnosis Date  . Alcohol abuse   . Colon cancer ([place])   . COPD  (chronic obstructive pulmonary disease) ([place])   . Emphysema of lung ([place])   . Former tobacco use   . Hypertension   . Peripheral edema   . Sepsis ([place]) 10/2016   Aspiration PNA/C diff colitis   Past Surgical History:  Procedure Laterality Date  . CARDIOVERSION N/A 03/16/2019   Procedure: CARDIOVERSION;  Surgeon: Pixie Casino, MD;  Location: [facility] ENDOSCOPY;  Service: Cardiovascular;  Laterality: N/A;  . CARDIOVERSION N/A 03/23/2019   Procedure: CARDIOVERSION;  Surgeon: Lelon Perla, MD;  Location: [facility] ENDOSCOPY;  Service: Cardiovascular;  Laterality: N/A;  . CATARACT EXTRACTION    . COLON RESECTION    . COLONOSCOPY    . IMPLANTATION BONE ANCHORED HEARING AID Left 2016  . MINOR HEMORRHOIDECTOMY  1995  . TEE WITHOUT CARDIOVERSION N/A 03/16/2019   Procedure: TRANSESOPHAGEAL ECHOCARDIOGRAM (TEE);  Surgeon: Pixie Casino, MD;  Location: [facility] ENDOSCOPY;  Service: Cardiovascular;  Laterality: N/A;  . TONSILLECTOMY       Current Meds  Medication Sig  . allopurinol (ZYLOPRIM) 100 MG tablet Take 100 mg by mouth daily.  Marland Kitchen amiodarone (PACERONE) 200 MG tablet Take 1 tablet (200 mg total) by mouth daily.  . bisoprolol (ZEBETA) 5 MG tablet Take 5 mg by mouth daily.  . clindamycin (CLEOCIN) 300 MG capsule Take 300 mg by mouth every 8 (eight) hours.  Marland Kitchen diltiazem (CARDIZEM CD) 120 MG 24 hr capsule Take 1 capsule (120 mg total) by mouth at bedtime.  . ferrous sulfate 325 (65 FE) MG tablet Take 325 mg by mouth daily.   . furosemide (LASIX) 40 MG tablet Take 40 mg by mouth daily.  Marland Kitchen ipratropium-albuterol (DUONEB) 0.5-2.5 (3) MG/3ML SOLN Use twice a day scheduled and every 4 hours as needed for shortness of breath and wheezing  . Multiple Vitamin (MULTIVITAMIN WITH MINERALS) TABS tablet Take 1 tablet by mouth daily.  . OXYGEN Inhale 4 L into the lungs continuous.  . Rivaroxaban (XARELTO) 15 MG TABS tablet Take 1 tablet (15 mg total) by mouth daily with supper.  . tiotropium (SPIRIVA) 18 MCG  inhalation capsule Place 1 capsule (18 mcg total) into inhaler and inhale daily.  . [DISCONTINUED] amiodarone (PACERONE) 400 MG tablet Take 400 mg twice daily for 10 days, on 11th day switch to 200 mg once daily  . [DISCONTINUED] aspirin EC 81 MG tablet Take 81 mg by mouth daily.      Allergies:   Flexeril [cyclobenzaprine]   Social History   Tobacco Use  . Smoking status: Former Smoker    Packs/day: 1.00    Years: 20.00    Pack years: 20.00    Types: Cigarettes    Last attempt to quit: 12/20/1981    Years since quitting: 37.3  . Smokeless tobacco: Never Used  Substance Use Topics  . Alcohol use: Yes    Alcohol/week: 7.0 standard drinks    Types: 7 Standard drinks or equivalent per week    Comment: states he drinks 3-4 days a week, "a couple" on those occasions but does not further quaniity.  . Drug use:  No     Family Hx: The patient's family history includes Brain cancer in his father; Colon cancer in his mother; Lung cancer in his brother.  ROS:   Please see the history of present illness.    All other systems reviewed and are negative.   Labs/Other Tests and Data Reviewed:    Recent Labs: 12/21/2018: TSH 2.990 03/17/2019: ALT 20 03/20/2019: B Natriuretic Peptide 191.2 03/23/2019: Hemoglobin 11.5; Platelets 138 03/26/2019: BUN 112; Creatinine, Ser 3.14; Magnesium 2.0; Potassium 3.8; Sodium 137   Recent Lipid Panel Lab Results  Component Value Date/Time   CHOL 106 11/10/2016 03:31 AM   TRIG 100 03/05/2019 05:39 AM   HDL 60 11/10/2016 03:31 AM   CHOLHDL 1.8 11/10/2016 03:31 AM   LDLCALC 29 11/10/2016 03:31 AM    Wt Readings from Last 3 Encounters:  04/17/19 225 lb (102.1 kg)  03/26/19 226 lb 3.1 oz (102.6 kg)  01/17/19 229 lb (103.9 kg)     Exam:    Vital Signs:  BP 129/68   Pulse 81   Ht 5\' 11"  (1.803 m)   Wt 225 lb (102.1 kg)   BMI 31.38 kg/m    Pt was not SOB with conversation and in no acute distress during the interview  ASSESSMENT & PLAN:    AF with  RVR- New onset I the setting of acute respiratory failure and CAP.  He did not convert with TEE CV (twice).  Amiodarone loading now with plans to proceed with OP DCCV in 8-12 weeks.   Acute diastolic CHF- Stable by his history.  AKI- It appears he has had acute on chronic renal insufficiency.  He is being followed by Dr Moshe Cipro.  Acute on chronic respiratory failure- The patient was discharged on O2 4L. I'm not sure what to make of his recent CXR findings, the patient is not ill by history.  I will arrange f/u with the pulmonary service, I believe he has seen Dr Melvyn Novas in the past.  Anemia I suspect this is secondary to renal disease.  Per his wife it sounds like OP ron infusion to be arranged.   Anticoagulated- Pt is on renal dose Xarelto.  I don't see an indication for ASA 81 mg so will stop this.  COVID-19 Education: The signs and symptoms of COVID-19 were discussed with the patient and how to seek care for testing (follow up with PCP or arrange E-visit).  The importance of social distancing was discussed today.  Patient Risk:   After full review of this patients clinical status, I feel that they are at least moderate risk at this time.  Time:   Today, I have spent 30 minutes with the patient and his wife with telehealth technology discussing CHF, anemia, and atrial fibrillation.     Medication Adjustments/Labs and Tests Ordered: Current medicines are reviewed at length with the patient today.  Concerns regarding medicines are outlined above.  Tests Ordered: Orders Placed This Encounter  Procedures  . Ambulatory referral to Pulmonology   Medication Changes: Meds ordered this encounter  Medications  . amiodarone (PACERONE) 200 MG tablet    Sig: Take 1 tablet (200 mg total) by mouth daily.    Dispense:  90 tablet    Refill:  0    PATIENT CAN PICK UP WHEN NEED CAN BE PLACED ON HOLD IF TOO EARLY TO FILL    Disposition:  Dr Gwenlyn Found in two months- he will nned to come to  the office and see DOD if neccessary  for an EKG prior to cardioversion.   Angelena Form, PA-C  04/17/2019 12:48 PM    [facility]

## 2019-04-20 ENCOUNTER — Other Ambulatory Visit: Payer: Self-pay | Admitting: Cardiovascular Disease

## 2019-04-20 NOTE — Telephone Encounter (Signed)
° ° ° °*  STAT* If patient is at the pharmacy, call can be transferred to refill team.   1. Which medications need to be refilled? (please list name of each medication and dose if known)  furosemide (LASIX) 40 MG tablet, Rivaroxaban (XARELTO) 15 MG TABS tablet  2. Which pharmacy/location (including street and city if local pharmacy) is medication to be sent to?  [facility] [place] - [address] AT [place] [address]. Do they need a 30 day or 90 day supply? [place]

## 2019-04-23 ENCOUNTER — Telehealth: Payer: Self-pay | Admitting: Cardiology

## 2019-04-23 ENCOUNTER — Other Ambulatory Visit: Payer: Self-pay | Admitting: Cardiovascular Disease

## 2019-04-23 MED ORDER — RIVAROXABAN 15 MG PO TABS: 15.0000 mg | ORAL_TABLET | Freq: Every day | ORAL | 1 refills | Status: DC

## 2019-04-23 MED ORDER — DILTIAZEM HCL ER COATED BEADS 120 MG PO CP24: 120.0000 mg | ORAL_CAPSULE | Freq: Every day | ORAL | 5 refills | Status: DC

## 2019-04-23 NOTE — Telephone Encounter (Signed)
Diltiazem CD 120 mg refilled.

## 2019-04-23 NOTE — Telephone Encounter (Signed)
°*  STAT* If patient is at the pharmacy, call can be transferred to refill team.   1. Which medications need to be refilled? (please list name of each medication and dose if known) xarelto 15 mg diltiazem 120 mg  2. Which pharmacy/location (including street and city if local pharmacy) is medication to be sent to? [facility] [place] [facility]  3. Do they need a 30 day or 90 day supply? [place]

## 2019-04-23 NOTE — Telephone Encounter (Signed)
Spoke with pt and pt wife per DPR. Pt is concerned about weight gain from 4/15 to today. Per pt wife, pt discharged from hospital weighing 225 lbs and went down to 222 lbs with increase of furosemide to 80 mg. Pt wife states that pt wt increased to 224-226 lbs near end of April and pt currently at 228 lbs since nephrologist decreased dosage to 40 mg about a week ago. She states they consulted PCP recently b/c of weight gain and pt was instructed to increase furosemide to 60mg  daily.  Pt and pt wife deny salt indiscretion and stated that pt on low-salt diet since d/c from hospital. Pt denied edema to any extremities. Pt states he is a little SOB but has been that was since d/c from hospital. Pt wife concerned about pt pulse ox reading changes. She states pt O2 91-92% last week and 88-90% today even with deep breathing and warming up finger to prep, but pt states O2 up to 97% when exercises. Pt denies symptoms Per wife, pt has in-person appt with pulmonologist tomorrow. Pt wife would also like to know if any f/u testing needed for pt at this time just to check on pt status. Advised to update pulmonologist on concerns and that message would be routed to a provider for consult. Pt and pt wife agreeable.

## 2019-04-23 NOTE — Telephone Encounter (Signed)
New Message:   Patient gain a some weight and would like to speak with some one.

## 2019-04-23 NOTE — Telephone Encounter (Signed)
I agree with the lasix and with pulmonologist visit. I will await recommendations from pulmonology concerning his oxygen status.

## 2019-04-23 NOTE — Telephone Encounter (Signed)
85yo Male Scr = 3.64 on 03/2019 Telemedicine 04/17/2019  CrCl = 70ml/min

## 2019-04-24 ENCOUNTER — Telehealth: Payer: Self-pay | Admitting: Cardiovascular Disease

## 2019-04-24 ENCOUNTER — Encounter: Payer: Self-pay | Admitting: Internal Medicine

## 2019-04-24 ENCOUNTER — Ambulatory Visit: Payer: Medicare Other | Admitting: Internal Medicine

## 2019-04-24 ENCOUNTER — Ambulatory Visit (INDEPENDENT_AMBULATORY_CARE_PROVIDER_SITE_OTHER): Payer: Medicare Other

## 2019-04-24 ENCOUNTER — Other Ambulatory Visit: Payer: Self-pay

## 2019-04-24 VITALS — BP 120/66 | HR 80 | Temp 98.0°F | Ht 70.0 in | Wt 231.0 lb

## 2019-04-24 DIAGNOSIS — I4891 Unspecified atrial fibrillation: Secondary | ICD-10-CM | POA: Diagnosis not present

## 2019-04-24 DIAGNOSIS — J9611 Chronic respiratory failure with hypoxia: Secondary | ICD-10-CM

## 2019-04-24 DIAGNOSIS — J189 Pneumonia, unspecified organism: Secondary | ICD-10-CM

## 2019-04-24 DIAGNOSIS — J441 Chronic obstructive pulmonary disease with (acute) exacerbation: Secondary | ICD-10-CM

## 2019-04-24 NOTE — Patient Instructions (Addendum)
Stop spiriva and take the  duoneb up to 4 x in 24 hours as you feel you need it if it helps your shortness of breath  For cough/congestion >  mucinex or mucinex dm up to 1200 mg every 12 hours and use flutter valve  as much as you can   Humidify 02 as much of the time as your can    Please remember to go to the  x-ray department  for your tests - we will call you with the results when they are available   Add:  cxr worse rx levaquin 500 x 7 days

## 2019-04-24 NOTE — Telephone Encounter (Signed)
  Donita from [facility] is calling because Mr David Barton's lasix was decreased to 40 mg daily. Over the weekend he has had some increased SOB. On 5/1 and 5/2 his weight was 225 lbs and on 5/3 and 5/4 it 226 lbs and today it is 228. His wife called PCP and his lasix was increased back up to 60 mg. His pulse ox was from 89-91 this morning. And she does hear a few crackles in right lower lobe. She is drawing blood today that kidney dr ordered and he will see his pulmonologist this afternoon. He is not able to walk around the house without having SOB. Can either call Donita or wife.

## 2019-04-24 NOTE — Progress Notes (Signed)
David Barton, male    DOB: Aug 25, 1933,    MRN: [id number]   Brief patient profile:  85 yowm  Quit smoking 1983  At wt 180 with baseline able to walk the dog 10 minutes slow pace avoid steps maint on spiriva with GOLD II COPD criteria 07/16/13      Admit date: 03/03/2019  Discharge date: 03/26/2019  Admitted From: Home   Disposition:  Home        Chief Complaint  Patient presents with  . Blood Infection  . Altered Mental Status     Brief history of present illness from the day of admission and additional interim summary    85 year old gentleman with history of COPD, emphysema, PSVT, essential hypertension, systolic heart failure with known ejection fraction of 30%, alcohol abuse, history of colon cancer who was admitted to the intensive care unit on 03/04/2019 with 4 days history of increasing shortness of breath, productive cough. He was on Tamiflu empirically. Patient was transferred to intensive care unit from [facility] on ventilator and treated as pneumonia and septic shock. He has ultimately improved but remains on high flow oxygen. Currently on medical floor. Has persistent A. fib, underwent TEE and cardioversion with transient sinus rhythm then back to A. fib. COVID-19 ruled out. He has finished his antibiotic therapy. Developed rapid A. fib new onset in the hospital.                                                                 Hospital Course    Acute hypoxic respiratory failure secondary to chronic diastolic CHF EF preserved at 60% due to A. fib RVR due to Afib RVR: Initially suspicion was for community-acquired pneumonia and he has finished antibiotic course in ICU for tach, he was also checked for COVID-19 and was negative.  Seen by cardiology and nephrology and aggressively diuresed, he also had problems with atelectasis and mucous plugging causing left lower lobe collapse which improved after chest PT and Mucomyst treatments, he was initially  using 10 L nasal cannula oxygen  down to 3 and symptom-free at rest.  He will be discharged home with flutter valve and incentive spirometer, instructed to use it every hour while awake sitting up in the chair.  He will get home oxygen with nebulizer treatments and nebulizing machine, requested to follow with PCP within a week.   New onset A. fib with RVR [place] vas 2 score of at least 3: Echo noted, TSH stable. Discussed with cardiologist he underwent second cardioversion attempt on 03/23/2019 which failed he remains in A. fib, continue combination of amiodarone, Xarelto and monitor. Will go home on amiodarone 400 twice daily and then will be switched to 200 once a day on 04/07/2019.  Xarelto dose has been adjusted by pharmacy.  PCP to monitor renal function and address anticoagulation as needed in the future.  COPD no wheezing. No exacerbation: supportive care stop steroids.  Acute kidney injury with history of chronic kidney disease stage 4: His VS baseline creatinine 1.7,nephrology has placed him on once a day Lasix orally, nephrologist Dr. Jimmy Footman believe that this is patient's new baseline creatinine and renal function, case discussed with him on 03/23/2019.  Will be discharged on once daily oral Lasix with  outpatient PCP and nephrology follow-up for monitoring of renal function.  Generalized deconditioning:We will continue to work with PT OT. He has done some improvement now.    Discharge diagnosis     Active Problems:   Acute respiratory failure ([name])   COPD with acute exacerbation ([name])   Essential hypertension   Community acquired pneumonia of right lung   Acute systolic CHF (congestive heart failure) ([name])   Pulmonary edema   Edema of both legs   Atrial fibrillation with RVR ([name])   Pressure injury of skin    @ time of d/c : 4lpm / inpt rehab rec but concern with [phone] sent home with advanced home care Felt like losing ground even before left the hospital d/c  with walking with walker but  no cough at d/c   History of Present Illness  04/24/2019  Pulmonary/ 1st office eval/David Barton / 02 4lpm 24/7 / persistent afib and started amiodarone Chief Complaint  Patient presents with  . Pulmonary Consult    Referred by Kerin Ransom, PA. Pt c/o SOB since March 2020. Pt c/o DOE with walking from room to room.  He has had some cough with clear to bloody sputum.     Dyspnea:  Gradually losing ground now Room to room gives out due to sob even on 4lpm  Cough: more bloody/ no pain with cough Sleep: on flat bed, 3 pillows on back SABA BMW:UXLKGM helps the most    No obvious patterns in  day to day or daytime variability or assoc   purulent sputum or mucus plugs   or cp or chest tightness, subjective wheeze or overt  hb symptoms -  having lots of bloody nasal discharge relates to irritation from Nasal cannulae   Sleeping as above  without nocturnal  or early am exacerbation  of respiratory  c/o's or need for noct saba. Also denies any obvious fluctuation of symptoms with weather or environmental changes or other aggravating or alleviating factors except as outlined above   No unusual exposure hx or h/o childhood pna/ asthma or knowledge of premature birth.  Current Allergies, Complete Past Medical History, Past Surgical History, Family History, and Social History were reviewed in [employer] record.  ROS  The following are not active complaints unless bolded Hoarseness, sore throat, dysphagia, dental problems, itching, sneezing,  nasal congestion or discharge of excess mucus or purulent secretions, ear ache,   fever, chills, sweats, unintended wt loss or wt gain, classically pleuritic or exertional cp,  orthopnea pnd or arm/hand swelling  or leg swelling, presyncope, palpitations, abdominal pain, anorexia, nausea, vomiting, diarrhea  or change in bowel habits or change in bladder habits, change in stools or change in urine, dysuria, hematuria,  rash,  arthralgias, visual complaints, headache, numbness, weakness or ataxia or problems with walking or coordination,  change in mood or  memory.               Past Medical History:  Diagnosis Date  . Alcohol abuse   . Colon cancer ([place])   . COPD (chronic obstructive pulmonary disease) ([place])   . Emphysema of lung ([place])   . Former tobacco use   . Hypertension   . Peripheral edema   . Sepsis ([place]) 10/2016   Aspiration PNA/C diff colitis    Outpatient Medications Prior to Visit  Medication Sig Dispense Refill  . allopurinol (ZYLOPRIM) 100 MG tablet Take 100 mg by mouth daily.    Marland Kitchen amiodarone (PACERONE) 200 MG tablet  Take 1 tablet (200 mg total) by mouth daily. 90 tablet 0  . bisoprolol (ZEBETA) 5 MG tablet Take 5 mg by mouth daily.    . clindamycin (CLEOCIN) 300 MG capsule Take 300 mg by mouth every 8 (eight) hours.    Marland Kitchen diltiazem (CARDIZEM CD) 120 MG 24 hr capsule Take 1 capsule (120 mg total) by mouth at bedtime. 30 capsule 5  . ferrous sulfate 325 (65 FE) MG tablet Take 325 mg by mouth daily.     . furosemide (LASIX) 40 MG tablet Take 60 mg by mouth daily.     Marland Kitchen ipratropium-albuterol (DUONEB) 0.5-2.5 (3) MG/3ML SOLN Use twice a day scheduled and every 4 hours as needed for shortness of breath and wheezing 360 mL 0  . Multiple Vitamin (MULTIVITAMIN WITH MINERALS) TABS tablet Take 1 tablet by mouth daily.    . OXYGEN Inhale 4 L into the lungs continuous.    . Rivaroxaban (XARELTO) 15 MG TABS tablet Take 1 tablet (15 mg total) by mouth daily with supper. 90 tablet 1  . tiotropium (SPIRIVA) 18 MCG inhalation capsule Place 1 capsule (18 mcg total) into inhaler and inhale daily. 30 capsule 12  . famotidine (PEPCID) 40 MG tablet Take 40 mg by mouth at bedtime.        Objective:     BP 120/66 (BP Location: Left Arm, Cuff Size: Normal)   Pulse 80   Temp 98 F (36.7 C) (Oral)   Ht 5\' 10"  (1.778 m)   Wt 231 lb (104.8 kg)   SpO2 94%   BMI 33.15 kg/m   SpO2: 94 % O2 Type:  Continuous O2 O2 Flow Rate (L/min): 4 L/min   W/c bound elderly wm nad at rest     Pot belly / slt distant bs / trace edema     HEENT: nl dentition / oropharynx. Nl external ear canals without cough reflex -  Mild bilateral non-specific turbinate edema     NECK :  without JVD/Nodes/TM/ nl carotid upstrokes bilaterally   LUNGS: no acc muscle use,  Mild barrel  contour chest wall with bilateral  Distant bs esp R base with coarsened bs on R  CV:  IRIR   no s3 or murmur or increase in P2, and trace bilateral sym LE pitting edema  ABD:  soft and nontender with pos end  insp Hoover's  in the supine position. No bruits or organomegaly appreciated, bowel sounds nl  MS:    ext warm without deformities, calf tenderness, cyanosis or clubbing No obvious joint restrictions   SKIN: warm and dry without lesions    NEURO:  alert, approp, nl sensorium with  no motor or cerebellar deficits apparent.        I personally reviewed images and agree with radiology impression as follows:  CXR:   04/10/19  Progressive multifocal airspace opacities throughout the right lung concerning for progression of pneumonia.  New small right-sided pleural effusion.      Assessment   COPD with acute exacerbation ([place]) Quit smoking 1983  - PFTs 07/16/2013  FEV1  1.75 (61%) ratio 52 and 15% better p B2, DLCO 74%   When respiratory symptoms begin or become refractory well after a patient reports complete smoking cessation,  Especially when this wasn't the case while they were smoking, a red flag is raised based on the work of Dr Kris Mouton which states: if you quit smoking when your best day FEV1 is still relatively  preserved it is highly unlikely  you will progress to severe disease.  That is to say, once the smoking stops,  the symptoms should not suddenly erupt or markedly worsen.  If so, the differential diagnosis should include  obesity/deconditioning,  LPR/Reflux/Aspiration syndromes,  occult CHF, or   especially side effect of medications commonly used in this population ( like amiodarone)    >>> No evidence of significant aecopd here so just rx with duoneb qid for now      Atrial fibrillation with RVR ([place]) New onset 02/2019 with CAP > new start amiodarone - relatively well controlled rate on Cardizem and bisoprolol.  He has had some bleeding issues and I would have a very low threshold to stop Xarelto if there is any evidence of ongoing bleeding or unexplained drop in hemoglobin.  Since he has new pulmonary infiltrates that could represent alveolar hemorrhage and has hemoptysis someone asked him to stop DOAC x 3 days and check back with me at the end of the week.  Discussed in detail all the  indications, usual  risks and alternatives  relative to the benefits with patient who agrees to proceed with rx as outlined Using the principal that a bird  in the hand means more than 2 in the bush (the risk of bleeding being the former and the risk of clotting the latter).         HCAP (healthcare-associated pneumonia) See cxr 04/10/19 > worse 04/24/2019 developed while on amiodarone and Xarelto assoc with hemoptysis. - rx levaquin 500 mg qd x 7 d then f/u ov with cxr > to er if worse   Differential diagnosis includes aspiration though the distribution anteriorly on the right is against this, and alveolar hemorrhage syndrome while on Xarelto as well as amiodarone toxicity although the focality is against the later.  We will hold Xarelto as he is having low-grade amount as is and check back with him in 3 days.      Chronic respiratory failure with hypoxia ([name]) Onset 02/2019 secondary to cap, chf/ copd  Adequate control on present rx, reviewed in detail with pt > no change in rx needed  > 4lpm 24/ 7 but needs humidity        I had an extended discussion with the patient reviewing extensive  relevant studies completed to date and  lasting 25 minutes of a 40  minute post hosp   visit in  pt not seen by me since 2014     re  severe non-specific but potentially very serious refractory respiratory symptoms of uncertain and potentially multiple  etiologies.   Each maintenance medication was reviewed in detail including most importantly the difference between maintenance and prns and under what circumstances the prns are to be triggered using an action plan format that is not reflected in the computer generated alphabetically organized AVS.    Please see AVS for specific instructions unique to this office visit that I personally wrote and verbalized to the the pt in detail and then reviewed with pt  by my nurse highlighting any changes in therapy/plan of care  recommended at today's visit.         Christinia Gully, MD 04/24/2019

## 2019-04-24 NOTE — Telephone Encounter (Signed)
Spoke to patient 's wife and home health nurse.  RN informed wife-  From yestreday conversation - for patient to keep appointment with Dr Melvyn Novas. It will be a  Office visit .  Wife is concerned that patient is having decrease in activity ,  Labored breathing and using oxygen  24/7 at 4 liter.  She also wanted to know if all physicians are  Communicating with each other - primary-Dr Alyson Ingles, pulm-Dr Melvyn Novas, kidney-Dr Goldsborough,cardiologist- Dr Gwenlyn Found RN informed wife  That Dr Melvyn Novas could see information from cardiologist , and the primary and kidney doctor  Information has to be faxed to there offices. RN reassured wife to keep appointment with Dr Melvyn Novas today and he can indicate if cardiology appointment is needed  Face to face. Will route copy of last office visit to kidney doctor. ( labs are being obtain for doctor today by homehealth)  Wife is aware that she may call back after appt with Dr Melvyn Novas or pulm office may call to obtain an earlier appointment than already schedule with Dr Gwenlyn Found -Jun 2020 Wife verbalized understanding and thank the RN for calling back and listening to her.

## 2019-04-25 ENCOUNTER — Other Ambulatory Visit: Payer: Self-pay | Admitting: Internal Medicine

## 2019-04-25 ENCOUNTER — Encounter: Payer: Self-pay | Admitting: Internal Medicine

## 2019-04-25 ENCOUNTER — Telehealth: Payer: Self-pay | Admitting: Internal Medicine

## 2019-04-25 DIAGNOSIS — J9611 Chronic respiratory failure with hypoxia: Secondary | ICD-10-CM | POA: Insufficient documentation

## 2019-04-25 DIAGNOSIS — J189 Pneumonia, unspecified organism: Secondary | ICD-10-CM | POA: Insufficient documentation

## 2019-04-25 MED ORDER — LEVOFLOXACIN 500 MG PO TABS: 500.0000 mg | ORAL_TABLET | Freq: Every day | ORAL | 0 refills | Status: DC

## 2019-04-25 NOTE — Assessment & Plan Note (Signed)
Onset 02/2019 secondary to cap, chf/ copd  Adequate control on present rx, reviewed in detail with pt > no change in rx needed  > 4lpm 24/ 7 but needs humidity    I had an extended discussion with the patient reviewing extensive  relevant studies completed to date and  lasting 25 minutes of a 40  minute post hosp   visit in pt not seen by me since 2014     re  severe non-specific but potentially very serious refractory respiratory symptoms of uncertain and potentially multiple  etiologies.   Each maintenance medication was reviewed in detail including most importantly the difference between maintenance and prns and under what circumstances the prns are to be triggered using an action plan format that is not reflected in the computer generated alphabetically organized AVS.    Please see AVS for specific instructions unique to this office visit that I personally wrote and verbalized to the the pt in detail and then reviewed with pt  by my nurse highlighting any changes in therapy/plan of care  recommended at today's visit.

## 2019-04-25 NOTE — Progress Notes (Signed)
Spoke with pt and notified of results per Dr. Melvyn Novas. Pt verbalized understanding and denied any questions. Rx sent to pharm

## 2019-04-25 NOTE — Telephone Encounter (Signed)
Discussed with pt not clear this is hcap but best to treat it as such with close monitoring

## 2019-04-25 NOTE — Telephone Encounter (Signed)
Instructions per OV with MW 04/24/2019 Stop spiriva and take the  duoneb up to 4 x in 24 hours as you feel you need it if it helps your shortness of breath  For cough/congestion >  mucinex or mucinex dm up to 1200 mg every 12 hours and use flutter valve  as much as you can   Humidify 02 as much of the time as your can    Please remember to go to the  x-ray department  for your tests - we will call you with the results when they are available   Add:  cxr worse rx levaquin 500 x 7 days       levaquin was called into pt's pharmacy today, 04/25/2019 due to results of CXR.  Called and spoke with pt who is wanting to know how bad the pneumonia is based on the cxr that was performed whether it is just a touch of pneumonia or if the pneumonia is bad. Pt is wanting to know due to his recent hospitalization 3/14 when he was hospitalized for multifocal pneumonia.  Dr. Melvyn Novas, please advise on this for pt. Thanks!

## 2019-04-25 NOTE — Assessment & Plan Note (Addendum)
New onset 02/2019 with CAP > new start amiodarone - relatively well controlled rate on Cardizem and bisoprolol.  He has had some bleeding issues and I would have a very low threshold to stop Xarelto if there is any evidence of ongoing bleeding or unexplained drop in hemoglobin.  Since he has new pulmonary infiltrates that could represent alveolar hemorrhage and has hemoptysis someone asked him to stop DOAC x 3 days and check back with me at the end of the week.  Discussed in detail all the  indications, usual  risks and alternatives  relative to the benefits with patient who agrees to proceed with rx as outlined Using the principal that a bird  in the hand means more than 2 in the bush (the risk of bleeding being the former and the risk of clotting the latter).

## 2019-04-25 NOTE — Assessment & Plan Note (Addendum)
Quit smoking 1983  - PFTs 07/16/2013  FEV1  1.75 (61%) ratio 52 and 15% better p B2, DLCO 74%   When respiratory symptoms begin or become refractory well after a patient reports complete smoking cessation,  Especially when this wasn't the case while they were smoking, a red flag is raised based on the work of Dr Kris Mouton which states: if you quit smoking when your best day FEV1 is still relatively  preserved it is highly unlikely you will progress to severe disease.  That is to say, once the smoking stops,  the symptoms should not suddenly erupt or markedly worsen.  If so, the differential diagnosis should include  obesity/deconditioning,  LPR/Reflux/Aspiration syndromes,  occult CHF, or  especially side effect of medications commonly used in this population ( like amiodarone)    No evidence of significant aecopd here so just rx with duoneb qid for now

## 2019-04-25 NOTE — Assessment & Plan Note (Signed)
See cxr 04/10/19 > worse 04/24/2019 developed while on amiodarone and Xarelto assoc with hemoptysis. - rx levaquin 500 mg qd x 7 d then f/u ov with cxr > to er if worse   Differential diagnosis includes aspiration though the distribution anteriorly on the right is against this, and alveolar hemorrhage syndrome while on Xarelto as well as amiodarone toxicity although the focality is against the later.  We will hold Xarelto as he is having low-grade amount as is and check back with him in 3 days.

## 2019-04-27 ENCOUNTER — Telehealth: Payer: Self-pay | Admitting: Internal Medicine

## 2019-04-27 NOTE — Telephone Encounter (Signed)
Discussed with pt's wife - he's feeling better off xarelto but hgb down 2 gm per wife (labs drawn by renal surface not available on epic   So rec  hold xarelto Recheck hgb per [facility] 04/30/19 before any further xarelto.

## 2019-04-27 NOTE — Telephone Encounter (Signed)
Spoke with David Barton and wife.  They were expecting a phone call from Dr. Melvyn Novas today to see if David Barton was feeling better.  David Barton also stated Dr Melvyn Novas had instructed him to hold his xarelto for 3 days.  Is it ok for him to resume or still hold?  David Barton is not coughing, no mucus or blood.  Last oxygen sat was 94%.  David Barton had oxygen generator replaced and is doing better.  David Barton states he is feeling better.  Dr Clover Mealy [phone]) should have sent [place] lab results.  Will look for this.  Dr Melvyn Novas please advise.

## 2019-05-01 ENCOUNTER — Other Ambulatory Visit: Payer: Self-pay

## 2019-05-01 ENCOUNTER — Ambulatory Visit: Payer: Medicare Other

## 2019-05-01 ENCOUNTER — Telehealth: Payer: Self-pay | Admitting: Internal Medicine

## 2019-05-01 DIAGNOSIS — J449 Chronic obstructive pulmonary disease, unspecified: Secondary | ICD-10-CM

## 2019-05-01 NOTE — Telephone Encounter (Signed)
I have a copy of the labs and the ones that were done are not helpful in terms of decision making - would resume the xarelto and see [place] w/in 3 days for cxr/ esr/cbc/ ov to regroup but no further abx for now  Must bring all active meds with him

## 2019-05-01 NOTE — Telephone Encounter (Signed)
Assessment & Plan Note by Tanda Rockers, MD at 04/25/2019 8:18 AM  Author: Tanda Rockers, MD Author Type: Physician Filed: 04/25/2019 8:19 AM  Note Status: Written Cosign: Cosign Not Required Encounter Date: 04/24/2019  Problem: HCAP (healthcare-associated pneumonia)  Editor: Tanda Rockers, MD (Physician)    See cxr 04/10/19 > worse 04/24/2019 developed while on amiodarone and Xarelto assoc with hemoptysis. - rx levaquin 500 mg qd x 7 d then f/u ov with cxr > to er if worse   Differential diagnosis includes aspiration though the distribution anteriorly on the right is against this, and alveolar hemorrhage syndrome while on Xarelto as well as amiodarone toxicity although the focality is against the later.  We will hold Xarelto as he is having low-grade amount as is and check back with him in 3 days.     Called and spoke with pt and wife [name] who state pt is feeling much better after finishing the round of abx and they want to know if they need to renew the Rx. They also want to know if pt needs to have another cxr performed based on the last cxr that was performed.  [name] stated that pt is still becoming SOB with exertion and is on 4L O2. Per [name], pt states the SOB is about the same since last visit with MW.  Pt also wants to know if it is okay for pt to start back on Xarelto. Also, pt had labwork yesterday at [facility] that was ordered by Dr. Clover Mealy. [name] stated that the labwork should have been sent to Dr. Donato Heinz office and she is wanting to know if MW would like to obtain a copy of the results.  Dr. Melvyn Novas, please advise on this for pt and wife [name]. Thanks!

## 2019-05-01 NOTE — Telephone Encounter (Signed)
Called and spoke with Patient.  Dr Melvyn Novas recommendations given.  Patient stated understanding. Patient instructed to bring all meds with him to OV.  Patient told labs and cxr would be ordered, so Patient could have done before OV. Patient scheduled with Dr Melvyn Novas, 05/04/19, at 1045.  Nothing further at this time.

## 2019-05-04 ENCOUNTER — Other Ambulatory Visit: Payer: Self-pay | Admitting: Internal Medicine

## 2019-05-04 ENCOUNTER — Ambulatory Visit: Payer: Medicare Other | Admitting: Internal Medicine

## 2019-05-04 ENCOUNTER — Ambulatory Visit: Payer: Medicare Other

## 2019-05-04 ENCOUNTER — Ambulatory Visit (INDEPENDENT_AMBULATORY_CARE_PROVIDER_SITE_OTHER): Payer: Medicare Other

## 2019-05-04 ENCOUNTER — Other Ambulatory Visit: Payer: Self-pay

## 2019-05-04 ENCOUNTER — Encounter: Payer: Self-pay | Admitting: Internal Medicine

## 2019-05-04 VITALS — BP 104/60 | HR 68 | Temp 97.9°F | Ht 70.0 in | Wt 226.0 lb

## 2019-05-04 DIAGNOSIS — J441 Chronic obstructive pulmonary disease with (acute) exacerbation: Secondary | ICD-10-CM

## 2019-05-04 DIAGNOSIS — J9611 Chronic respiratory failure with hypoxia: Secondary | ICD-10-CM | POA: Diagnosis not present

## 2019-05-04 DIAGNOSIS — R0609 Other forms of dyspnea: Secondary | ICD-10-CM | POA: Diagnosis not present

## 2019-05-04 DIAGNOSIS — I4891 Unspecified atrial fibrillation: Secondary | ICD-10-CM

## 2019-05-04 DIAGNOSIS — J9612 Chronic respiratory failure with hypercapnia: Secondary | ICD-10-CM

## 2019-05-04 DIAGNOSIS — J189 Pneumonia, unspecified organism: Secondary | ICD-10-CM

## 2019-05-04 LAB — CBC WITH DIFFERENTIAL/PLATELET
Basophils Absolute: 0.1 10*3/uL (ref 0.0–0.1)
Basophils Relative: 0.7 % (ref 0.0–3.0)
Eosinophils Absolute: 0.5 10*3/uL (ref 0.0–0.7)
Eosinophils Relative: 5.2 % — ABNORMAL HIGH (ref 0.0–5.0)
HCT: 26.3 % — ABNORMAL LOW (ref 39.0–52.0)
Hemoglobin: 8.6 g/dL — ABNORMAL LOW (ref 13.0–17.0)
Lymphocytes Relative: 9.6 % — ABNORMAL LOW (ref 12.0–46.0)
Lymphs Abs: 0.9 10*3/uL (ref 0.7–4.0)
MCHC: 32.9 g/dL (ref 30.0–36.0)
MCV: 100.9 fl — ABNORMAL HIGH (ref 78.0–100.0)
Monocytes Absolute: 0.9 10*3/uL (ref 0.1–1.0)
Monocytes Relative: 10 % (ref 3.0–12.0)
Neutro Abs: 6.6 10*3/uL (ref 1.4–7.7)
Neutrophils Relative %: 74.5 % (ref 43.0–77.0)
Platelets: 438 10*3/uL — ABNORMAL HIGH (ref 150.0–400.0)
RBC: 2.6 Mil/uL — ABNORMAL LOW (ref 4.22–5.81)
RDW: 17.2 % — ABNORMAL HIGH (ref 11.5–15.5)
WBC: 8.9 10*3/uL (ref 4.0–10.5)

## 2019-05-04 LAB — BASIC METABOLIC PANEL
BUN: 73 mg/dL — ABNORMAL HIGH (ref 6–23)
CO2: 33 mEq/L — ABNORMAL HIGH (ref 19–32)
Calcium: 9.5 mg/dL (ref 8.4–10.5)
Chloride: 98 mEq/L (ref 96–112)
Creatinine, Ser: 3.07 mg/dL — ABNORMAL HIGH (ref 0.40–1.50)
GFR: 19.54 mL/min — ABNORMAL LOW (ref >60.00)
Glucose, Bld: 134 mg/dL — ABNORMAL HIGH (ref 70–99)
Potassium: 4 mEq/L (ref 3.5–5.1)
Sodium: 142 mEq/L (ref 135–145)

## 2019-05-04 LAB — SEDIMENTATION RATE: Sed Rate: 75 mm/hr — ABNORMAL HIGH (ref 0–20)

## 2019-05-04 LAB — BRAIN NATRIURETIC PEPTIDE: Pro B Natriuretic peptide (BNP): 443 pg/mL — ABNORMAL HIGH (ref 0.0–100.0)

## 2019-05-04 LAB — TSH: TSH: 5.94 u[IU]/mL — ABNORMAL HIGH (ref 0.35–4.50)

## 2019-05-04 NOTE — Progress Notes (Signed)
David Barton, male    DOB: 07-31-1933,    MRN: [id number]   Brief patient profile:  85 yowm  Quit smoking 1983  At wt 180 with baseline able to walk the dog 10 minutes slow pace but  avoided steps maint on spiriva with GOLD II COPD criteria 07/16/13      Admit date: 03/03/2019  Discharge date: 03/26/2019  Admitted From: Home   Disposition:  Home        Chief Complaint  Patient presents with  . Blood Infection  . Altered Mental Status     Brief history of present illness from the day of admission and additional interim summary    85 year old gentleman with history of COPD, emphysema, PSVT, essential hypertension, systolic heart failure with known ejection fraction of 30%, alcohol abuse, history of colon cancer who was admitted to the intensive care unit on 03/04/2019 with 4 days history of increasing shortness of breath, productive cough. He was on Tamiflu empirically. Patient was transferred to intensive care unit from [facility] on ventilator and treated as pneumonia and septic shock. He has ultimately improved but remains on high flow oxygen. Currently on medical floor. Has persistent A. fib, underwent TEE and cardioversion with transient sinus rhythm then back to A. fib. COVID-19 ruled out. He has finished his antibiotic therapy. Developed rapid A. fib new onset in the hospital.                                                                 Hospital Course    Acute hypoxic respiratory failure secondary to chronic diastolic CHF EF preserved at 60% due to A. fib RVR due to Afib RVR: Initially suspicion was for community-acquired pneumonia and he has finished antibiotic course in ICU for tach, he was also checked for COVID-19 and was negative.  Seen by cardiology and nephrology and aggressively diuresed, he also had problems with atelectasis and mucous plugging causing left lower lobe collapse which improved after chest PT and Mucomyst treatments, he was  initially using 10 L nasal cannula oxygen  down to 3 and symptom-free at rest.  He will be discharged home with flutter valve and incentive spirometer, instructed to use it every hour while awake sitting up in the chair.  He will get home oxygen with nebulizer treatments and nebulizing machine, requested to follow with PCP within a week.   New onset A. fib with RVR [place] vas 2 score of at least 3: Echo noted, TSH stable. Discussed with cardiologist he underwent second cardioversion attempt on 03/23/2019 which failed he remains in A. fib, continue combination of amiodarone, Xarelto and monitor. Will go home on amiodarone 400 twice daily and then will be switched to 200 once a day on 04/07/2019.  Xarelto dose has been adjusted by pharmacy.  PCP to monitor renal function and address anticoagulation as needed in the future.  COPD no wheezing. No exacerbation: supportive care stop steroids.  Acute kidney injury with history of chronic kidney disease stage 4: His VS baseline creatinine 1.7,nephrology has placed him on once a day Lasix orally, nephrologist Dr. Jimmy Footman believe that this is patient's new baseline creatinine and renal function, case discussed with him on 03/23/2019.  Will be discharged on once daily oral  Lasix with outpatient PCP and nephrology follow-up for monitoring of renal function.  Generalized deconditioning:We will continue to work with PT OT. He has done some improvement now.    Discharge diagnosis     Active Problems:   Acute respiratory failure ([name])   COPD with acute exacerbation ([name])   Essential hypertension   Community acquired pneumonia of right lung   Acute systolic CHF (congestive heart failure) ([name])   Pulmonary edema   Edema of both legs   Atrial fibrillation with RVR ([name])   Pressure injury of skin    @ time of d/c : 4lpm / inpt rehab rec but concern with [phone] sent home with advanced home care Felt like losing ground even before left the  hospital d/c with walking with walker but  no cough at d/c   History of Present Illness  04/24/2019  Pulmonary/ 1st office eval/David Barton / 02 4lpm 24/7 / persistent afib and started amiodarone Chief Complaint  Patient presents with  . Pulmonary Consult    Referred by Kerin Ransom, PA. Pt c/o SOB since March 2020. Pt c/o DOE with walking from room to room.  He has had some cough with clear to bloody sputum.     Dyspnea:  Gradually losing ground now Room to room gives out due to sob even on 4lpm  Cough: more bloody/ no pain with cough Sleep: on flat bed, 3 pillows on back SABA AVW:[id number] helps the most  rec Stop spiriva and take the  duoneb up to 4 x in 24 hours as you feel you need it if it helps your shortness of breath For cough/congestion >  mucinex or mucinex dm up to 1200 mg every 12 hours and use flutter valve  as much as you can  Humidify 02 as much of the time as your can  Add:  cxr worse >>>  rx levaquin 500 x 7 days     05/04/2019  f/u ov/David Barton re: copd/ pna vs alv hem vs amio effects/ main concern now is weak  Dyspnea:  Same = across the room, even on 02  Cough: dry now day > noct  Sleeping: flat bed/ 1  Pillow  s resp symptoms SABA use: just the duoneb qid 02: 4lpm and sats ok x with walking sometimes drop into 80s    No obvious day to day or daytime variability or assoc excess/ purulent sputum or mucus plugs or hemoptysis or cp or chest tightness, subjective wheeze or overt sinus or hb symptoms.   sleepiing  without nocturnal  or early am exacerbation  of respiratory  c/o's or need for noct saba. Also denies any obvious fluctuation of symptoms with weather or environmental changes or other aggravating or alleviating factors except as outlined above   No unusual exposure hx or h/o childhood pna/ asthma or knowledge of premature birth.  Current Allergies, Complete Past Medical History, Past Surgical History, Family History, and Social History were reviewed in [facility] record.  ROS  The following are not active complaints unless bolded Hoarseness, sore throat, dysphagia, dental problems, itching, sneezing,  nasal congestion or discharge of excess mucus or purulent secretions, ear ache,   fever, chills, sweats, unintended wt loss or wt gain, classically pleuritic or exertional cp,  orthopnea pnd or arm/hand swelling  or leg swelling, presyncope, palpitations, abdominal pain, anorexia, nausea, vomiting, diarrhea  or change in bowel habits or change in bladder habits, change in stools or change in urine, dysuria, hematuria,  rash, arthralgias, visual complaints, headache, numbness, weakness or ataxia or problems with walking or coordination,  change in mood or  memory.        Current Meds  Medication Sig  . allopurinol (ZYLOPRIM) 100 MG tablet Take 100 mg by mouth daily.  Marland Kitchen amiodarone (PACERONE) 200 MG tablet Take 1 tablet (200 mg total) by mouth daily.  . bisoprolol (ZEBETA) 5 MG tablet Take 5 mg by mouth daily.  . clindamycin (CLEOCIN) 300 MG capsule Take 300 mg by mouth every 8 (eight) hours.  Marland Kitchen diltiazem (CARDIZEM CD) 120 MG 24 hr capsule Take 1 capsule (120 mg total) by mouth at bedtime.  . famotidine (PEPCID) 40 MG tablet Take 40 mg by mouth at bedtime.  . ferrous sulfate 325 (65 FE) MG tablet Take 325 mg by mouth daily.   . furosemide (LASIX) 40 MG tablet Take 60 mg by mouth daily.   Marland Kitchen ipratropium-albuterol (DUONEB) 0.5-2.5 (3) MG/3ML SOLN Use twice a day scheduled and every 4 hours as needed for shortness of breath and wheezing  . levofloxacin (LEVAQUIN) 500 MG tablet Take 1 tablet (500 mg total) by mouth daily.  . Multiple Vitamin (MULTIVITAMIN WITH MINERALS) TABS tablet Take 1 tablet by mouth daily.  . OXYGEN Inhale 4 L into the lungs continuous.  . Rivaroxaban (XARELTO) 15 MG TABS tablet Take 1 tablet (15 mg total) by mouth daily with supper.                  Objective:       Wt Readings from Last 3 Encounters:   04/24/19 231 lb (104.8 kg)  04/17/19 225 lb (102.1 kg)  03/26/19 226 lb 3.1 oz (102.6 kg)     Vital signs reviewed - Note on arrival 02 sats  97% on 4lpm  cont      Pot belly / slt distant bs / trace edema     W/c bound elderly wm, brigter affect nad   HEENT: nl dentition / oropharynx. Nl external ear canals without cough reflex -  Mild bilateral non-specific turbinate edema     NECK :  without JVD/Nodes/TM/ nl carotid upstrokes bilaterally   LUNGS: no acc muscle use,  Mild barrel  contour chest wall with bilateral  Distant bs s audible wheeze and  without cough on insp or exp maneuver and mild  Hyperresonant  to  percussion bilaterally x dull at R base      CV:  IRIR apical pulse about 60  no s3 or murmur or increase in P2, and trace pitting  edema both LE's  ABD:  Quite obese soft and nontender with pos late insp Hoover's  in the supine position. No bruits or organomegaly appreciated, bowel sounds nl  MS:   Nl gait/  ext warm without deformities, calf tenderness, cyanosis or clubbing No obvious joint restrictions   SKIN: warm and dry without lesions    NEURO:  alert, approp, nl sensorium with  no motor or cerebellar deficits apparent.       CXR PA and Lateral:   05/04/2019 :    I personally reviewed images and agree with radiology impression as follows:    Significant improvement in right upper lobe pneumonia. Persistent right lower lobe airspace disease and small right effusion  Mild left lower lobe airspace disease and small left effusion Unchanged. My impression:  Atx/? Subpulmonic effusion on R     Labs ordered/ reviewed:      Chemistry  Component Value Date/Time   NA 142 05/04/2019 1137   NA 139 12/21/2018 1045   K 4.0 05/04/2019 1137   CL 98 05/04/2019 1137   CO2 33 (H) 05/04/2019 1137   BUN 73 (H) 05/04/2019 1137   BUN 35 (H) 12/21/2018 1045   CREATININE 3.07 (H) 05/04/2019 1137      Component Value Date/Time   CALCIUM 9.5 05/04/2019 1137    ALKPHOS 51 03/17/2019 0317   AST 20 03/17/2019 0317   ALT 20 03/17/2019 0317   BILITOT 0.6 03/17/2019 0317   BILITOT 0.5 12/21/2018 1045        Lab Results  Component Value Date   WBC 8.9 05/04/2019   HGB 8.6 Repeated and verified X2. (L) 05/04/2019   HCT 26.3 (L) 05/04/2019   MCV 100.9 (H) 05/04/2019   PLT 438.0 (H) 05/04/2019       EOS                                                              0.5                                     05/04/2019     Lab Results  Component Value Date   TSH 5.94 (H) 05/04/2019     Lab Results  Component Value Date   PROBNP 443.0 (H) 05/04/2019       Lab Results  Component Value Date   ESRSEDRATE 75 (H) 05/04/2019            Assessment

## 2019-05-04 NOTE — Patient Instructions (Addendum)
Please remember to go to the lab department   for your tests - we will call you with the results when they are available.      No change in recommendations    Please schedule a follow up office visit in 4 weeks, sooner if needed with cxr  Additional recs p reviewed studies 1) adjust 02 to low 90's at rest and with activity  2) u/s p 2 2 off xarelto for thoracentesis

## 2019-05-06 ENCOUNTER — Encounter: Payer: Self-pay | Admitting: Internal Medicine

## 2019-05-06 DIAGNOSIS — J9611 Chronic respiratory failure with hypoxia: Secondary | ICD-10-CM | POA: Insufficient documentation

## 2019-05-06 NOTE — Assessment & Plan Note (Signed)
Quit smoking 1983  - PFTs 07/16/2013  FEV1  1.75 (61%) ratio 52 and 15% better p B2, DLCO 74%   >>> continue duoneb qid

## 2019-05-06 NOTE — Assessment & Plan Note (Signed)
Multifactorial with possible new R pleural effusion vs atx contributing. (see separate a/p)

## 2019-05-06 NOTE — Assessment & Plan Note (Signed)
See cxr 04/10/19 > worse 04/24/2019 developed while on amiodarone and Xarelto assoc with hemoptysis. - rx levaquin 500 mg qd x 7 d then f/u ov with cxr >  Much better RUL but ? New sub pulmonic effusion vs atx on R   >>>Will send for u/s with t centesis if fluid present as if so needs dx and therapeutic tap > the renal dysfunction limits contrast from being used to sort this out.  Will need off xarelto for two doses for this    I had an extended discussion with the patient reviewing all relevant studies completed to date and  lasting 15 to 20 minutes of a 25 minute visit    Each maintenance medication was reviewed in detail including most importantly the difference between maintenance and prns and under what circumstances the prns are to be triggered using an action plan format that is not reflected in the computer generated alphabetically organized AVS.     Please see AVS for specific instructions unique to this visit that I personally wrote and verbalized to the the pt in detail and then reviewed with pt  by my nurse highlighting any  changes in therapy recommended at today's visit to their plan of care.

## 2019-05-06 NOTE — Assessment & Plan Note (Signed)
New onset 02/2019 with CAP > new start amiodarone  >>>High ESR is only sign of amiodarone toxicity though may be affecting tsh so both esr and tsh will need to be followed in this setting

## 2019-05-06 NOTE — Assessment & Plan Note (Signed)
Onset 02/2019 secondary to cap, chf/ copd HC03 05/04/2019 = 32  Target for sats = lower 90s so continue 4lpm for now but may need less at rest, more with activity so needs to be monitored/ titrated >see avs for instructions unique to this ov

## 2019-05-07 NOTE — Progress Notes (Signed)
Dr Melvyn Novas d/w pt

## 2019-05-16 ENCOUNTER — Other Ambulatory Visit (HOSPITAL_COMMUNITY): Payer: Self-pay | Admitting: *Deleted

## 2019-05-17 ENCOUNTER — Ambulatory Visit (HOSPITAL_COMMUNITY)
Admission: RE | Admit: 2019-05-17 | Discharge: 2019-05-17 | Disposition: A | Discharge status: Home / Self Care | Payer: Medicare Other | Source: Ambulatory Visit | Attending: Nephrology | Admitting: Nephrology

## 2019-05-17 ENCOUNTER — Other Ambulatory Visit: Payer: Self-pay

## 2019-05-17 VITALS — BP 118/59 | HR 69 | Temp 98.6°F | Resp 20

## 2019-05-17 DIAGNOSIS — N189 Chronic kidney disease, unspecified: Secondary | ICD-10-CM | POA: Insufficient documentation

## 2019-05-17 DIAGNOSIS — J9621 Acute and chronic respiratory failure with hypoxia: Secondary | ICD-10-CM | POA: Diagnosis not present

## 2019-05-17 DIAGNOSIS — Z862 Personal history of diseases of the blood and blood-forming organs and certain disorders involving the immune mechanism: Secondary | ICD-10-CM | POA: Insufficient documentation

## 2019-05-17 DIAGNOSIS — J189 Pneumonia, unspecified organism: Secondary | ICD-10-CM | POA: Diagnosis not present

## 2019-05-17 LAB — POCT HEMOGLOBIN-HEMACUE: Hemoglobin: 9 g/dL — ABNORMAL LOW (ref 13.0–17.0)

## 2019-05-17 MED ORDER — SODIUM CHLORIDE 0.9 % IV SOLN
510.0000 mg | INTRAVENOUS | Status: DC
  Administered 2019-05-17: 510 mg via INTRAVENOUS
  Filled 2019-05-17: qty 510

## 2019-05-17 MED ORDER — EPOETIN ALFA-EPBX 10000 UNIT/ML IJ SOLN
20000.0000 [IU] | Freq: Once | INTRAMUSCULAR | Status: DC
  Filled 2019-05-17: qty 2

## 2019-05-17 MED ORDER — EPOETIN ALFA-EPBX 10000 UNIT/ML IJ SOLN
20000.0000 [IU] | INTRAMUSCULAR | Status: DC
  Administered 2019-05-17: 20000 [IU] via SUBCUTANEOUS
  Filled 2019-05-17: qty 2

## 2019-05-17 NOTE — Discharge Instructions (Signed)
Epoetin Alfa injection What is this medicine? EPOETIN ALFA (e POE e tin AL fa) helps your body make more red blood cells. This medicine is used to treat anemia caused by chronic kidney disease, cancer chemotherapy, or HIV-therapy. It may also be used before surgery if you have anemia. This medicine may be used for other purposes; ask your health care provider or pharmacist if you have questions. COMMON BRAND NAME(S): Epogen, Procrit, Retacrit What should I tell my health care provider before I take this medicine? They need to know if you have any of these conditions: -cancer -heart disease -high blood pressure -history of blood clots -history of stroke -low levels of folate, iron, or vitamin B12 in the blood -seizures -an unusual or allergic reaction to erythropoietin, albumin, benzyl alcohol, hamster proteins, other medicines, foods, dyes, or preservatives -pregnant or trying to get pregnant -breast-feeding How should I use this medicine? This medicine is for injection into a vein or under the skin. It is usually given by a health care professional in a hospital or clinic setting. If you get this medicine at home, you will be taught how to prepare and give this medicine. Use exactly as directed. Take your medicine at regular intervals. Do not take your medicine more often than directed. It is important that you put your used needles and syringes in a special sharps container. Do not put them in a trash can. If you do not have a sharps container, call your pharmacist or healthcare provider to get one. A special MedGuide will be given to you by the pharmacist with each prescription and refill. Be sure to read this information carefully each time. Talk to your pediatrician regarding the use of this medicine in children. While this drug may be prescribed for selected conditions, precautions do apply. Overdosage: If you think you have taken too much of this medicine contact a poison control center  or emergency room at once. NOTE: This medicine is only for you. Do not share this medicine with others. What if I miss a dose? If you miss a dose, take it as soon as you can. If it is almost time for your next dose, take only that dose. Do not take double or extra doses. What may interact with this medicine? Interactions have not been studied. This list may not describe all possible interactions. Give your health care provider a list of all the medicines, herbs, non-prescription drugs, or dietary supplements you use. Also tell them if you smoke, drink alcohol, or use illegal drugs. Some items may interact with your medicine. What should I watch for while using this medicine? Your condition will be monitored carefully while you are receiving this medicine. You may need blood work done while you are taking this medicine. This medicine may cause a decrease in vitamin B6. You should make sure that you get enough vitamin B6 while you are taking this medicine. Discuss the foods you eat and the vitamins you take with your health care professional. What side effects may I notice from receiving this medicine? Side effects that you should report to your doctor or health care professional as soon as possible: -allergic reactions like skin rash, itching or hives, swelling of the face, lips, or tongue -seizures -signs and symptoms of a blood clot such as breathing problems; changes in vision; chest pain; severe, sudden headache; pain, swelling, warmth in the leg; trouble speaking; sudden numbness or weakness of the face, arm or leg -signs and symptoms of a stroke  like changes in vision; confusion; trouble speaking or understanding; severe headaches; sudden numbness or weakness of the face, arm or leg; trouble walking; dizziness; loss of balance or coordination Side effects that usually do not require medical attention (report to your doctor or health care professional if they continue or are  bothersome): -chills -cough -dizziness -fever -headaches -joint pain -muscle cramps -muscle pain -nausea, vomiting -pain, redness, or irritation at site where injected This list may not describe all possible side effects. Call your doctor for medical advice about side effects. You may report side effects to FDA at 1-800-FDA-1088. Where should I keep my medicine? Keep out of the reach of children. Store in a refrigerator between 2 and 8 degrees C (36 and 46 degrees F). Do not freeze or shake. Throw away any unused portion if using a single-dose vial. Multi-dose vials can be kept in the refrigerator for up to 21 days after the initial dose. Throw away unused medicine. NOTE: This sheet is a summary. It may not cover all possible information. If you have questions about this medicine, talk to your doctor, pharmacist, or health care provider.  2019 Elsevier/Gold Standard (2017-07-15 08:35:19) Ferumoxytol injection What is this medicine? FERUMOXYTOL is an iron complex. Iron is used to make healthy red blood cells, which carry oxygen and nutrients throughout the body. This medicine is used to treat iron deficiency anemia. This medicine may be used for other purposes; ask your health care provider or pharmacist if you have questions. COMMON BRAND NAME(S): Feraheme What should I tell my health care provider before I take this medicine? They need to know if you have any of these conditions: -anemia not caused by low iron levels -high levels of iron in the blood -magnetic resonance imaging (MRI) test scheduled -an unusual or allergic reaction to iron, other medicines, foods, dyes, or preservatives -pregnant or trying to get pregnant -breast-feeding How should I use this medicine? This medicine is for injection into a vein. It is given by a health care professional in a hospital or clinic setting. Talk to your pediatrician regarding the use of this medicine in children. Special care may be  needed. Overdosage: If you think you have taken too much of this medicine contact a poison control center or emergency room at once. NOTE: This medicine is only for you. Do not share this medicine with others. What if I miss a dose? It is important not to miss your dose. Call your doctor or health care professional if you are unable to keep an appointment. What may interact with this medicine? This medicine may interact with the following medications: -other iron products This list may not describe all possible interactions. Give your health care provider a list of all the medicines, herbs, non-prescription drugs, or dietary supplements you use. Also tell them if you smoke, drink alcohol, or use illegal drugs. Some items may interact with your medicine. What should I watch for while using this medicine? Visit your doctor or healthcare professional regularly. Tell your doctor or healthcare professional if your symptoms do not start to get better or if they get worse. You may need blood work done while you are taking this medicine. You may need to follow a special diet. Talk to your doctor. Foods that contain iron include: whole grains/cereals, dried fruits, beans, or peas, leafy green vegetables, and organ meats (liver, kidney). What side effects may I notice from receiving this medicine? Side effects that you should report to your doctor or health care  professional as soon as possible: -allergic reactions like skin rash, itching or hives, swelling of the face, lips, or tongue -breathing problems -changes in blood pressure -feeling faint or lightheaded, falls -fever or chills -flushing, sweating, or hot feelings -swelling of the ankles or feet Side effects that usually do not require medical attention (report to your doctor or health care professional if they continue or are bothersome): -diarrhea -headache -nausea, vomiting -stomach pain This list may not describe all possible side effects.  Call your doctor for medical advice about side effects. You may report side effects to FDA at 1-800-FDA-1088. Where should I keep my medicine? This drug is given in a hospital or clinic and will not be stored at home. NOTE: This sheet is a summary. It may not cover all possible information. If you have questions about this medicine, talk to your doctor, pharmacist, or health care provider.  2019 Elsevier/Gold Standard (2017-01-24 20:21:10)

## 2019-05-19 ENCOUNTER — Other Ambulatory Visit: Payer: Self-pay

## 2019-05-19 ENCOUNTER — Emergency Department (HOSPITAL_COMMUNITY): Payer: Medicare Other

## 2019-05-19 ENCOUNTER — Inpatient Hospital Stay (HOSPITAL_COMMUNITY)
Admission: EM | Admit: 2019-05-19 | Discharge: 2019-05-30 | DRG: 193 | Disposition: A | Discharge status: Rehabilitation Facility | Payer: Medicare Other | Attending: Internal Medicine | Admitting: Internal Medicine

## 2019-05-19 DIAGNOSIS — Z515 Encounter for palliative care: Secondary | ICD-10-CM | POA: Diagnosis not present

## 2019-05-19 DIAGNOSIS — J9611 Chronic respiratory failure with hypoxia: Secondary | ICD-10-CM | POA: Diagnosis not present

## 2019-05-19 DIAGNOSIS — I130 Hypertensive heart and chronic kidney disease with heart failure and stage 1 through stage 4 chronic kidney disease, or unspecified chronic kidney disease: Secondary | ICD-10-CM | POA: Diagnosis present

## 2019-05-19 DIAGNOSIS — Z23 Encounter for immunization: Secondary | ICD-10-CM | POA: Diagnosis present

## 2019-05-19 DIAGNOSIS — J90 Pleural effusion, not elsewhere classified: Secondary | ICD-10-CM

## 2019-05-19 DIAGNOSIS — I5043 Acute on chronic combined systolic (congestive) and diastolic (congestive) heart failure: Secondary | ICD-10-CM | POA: Diagnosis not present

## 2019-05-19 DIAGNOSIS — I5033 Acute on chronic diastolic (congestive) heart failure: Secondary | ICD-10-CM | POA: Diagnosis present

## 2019-05-19 DIAGNOSIS — R918 Other nonspecific abnormal finding of lung field: Secondary | ICD-10-CM | POA: Diagnosis not present

## 2019-05-19 DIAGNOSIS — I5023 Acute on chronic systolic (congestive) heart failure: Secondary | ICD-10-CM | POA: Diagnosis not present

## 2019-05-19 DIAGNOSIS — R5381 Other malaise: Secondary | ICD-10-CM | POA: Diagnosis not present

## 2019-05-19 DIAGNOSIS — I480 Paroxysmal atrial fibrillation: Secondary | ICD-10-CM | POA: Diagnosis present

## 2019-05-19 DIAGNOSIS — I5021 Acute systolic (congestive) heart failure: Secondary | ICD-10-CM | POA: Diagnosis not present

## 2019-05-19 DIAGNOSIS — G4733 Obstructive sleep apnea (adult) (pediatric): Secondary | ICD-10-CM | POA: Diagnosis present

## 2019-05-19 DIAGNOSIS — I5032 Chronic diastolic (congestive) heart failure: Secondary | ICD-10-CM | POA: Diagnosis not present

## 2019-05-19 DIAGNOSIS — I4891 Unspecified atrial fibrillation: Secondary | ICD-10-CM | POA: Diagnosis present

## 2019-05-19 DIAGNOSIS — E0781 Sick-euthyroid syndrome: Secondary | ICD-10-CM | POA: Diagnosis present

## 2019-05-19 DIAGNOSIS — F40240 Claustrophobia: Secondary | ICD-10-CM | POA: Diagnosis present

## 2019-05-19 DIAGNOSIS — Z9889 Other specified postprocedural states: Secondary | ICD-10-CM | POA: Diagnosis not present

## 2019-05-19 DIAGNOSIS — Z6832 Body mass index (BMI) 32.0-32.9, adult: Secondary | ICD-10-CM

## 2019-05-19 DIAGNOSIS — N184 Chronic kidney disease, stage 4 (severe): Secondary | ICD-10-CM | POA: Diagnosis present

## 2019-05-19 DIAGNOSIS — D539 Nutritional anemia, unspecified: Secondary | ICD-10-CM | POA: Diagnosis present

## 2019-05-19 DIAGNOSIS — I429 Cardiomyopathy, unspecified: Secondary | ICD-10-CM | POA: Diagnosis present

## 2019-05-19 DIAGNOSIS — I4819 Other persistent atrial fibrillation: Secondary | ICD-10-CM | POA: Diagnosis present

## 2019-05-19 DIAGNOSIS — I313 Pericardial effusion (noninflammatory): Secondary | ICD-10-CM | POA: Diagnosis present

## 2019-05-19 DIAGNOSIS — Z800 Family history of malignant neoplasm of digestive organs: Secondary | ICD-10-CM

## 2019-05-19 DIAGNOSIS — Z808 Family history of malignant neoplasm of other organs or systems: Secondary | ICD-10-CM

## 2019-05-19 DIAGNOSIS — I4892 Unspecified atrial flutter: Secondary | ICD-10-CM | POA: Diagnosis not present

## 2019-05-19 DIAGNOSIS — Z66 Do not resuscitate: Secondary | ICD-10-CM | POA: Diagnosis present

## 2019-05-19 DIAGNOSIS — J440 Chronic obstructive pulmonary disease with acute lower respiratory infection: Secondary | ICD-10-CM | POA: Diagnosis present

## 2019-05-19 DIAGNOSIS — N179 Acute kidney failure, unspecified: Secondary | ICD-10-CM | POA: Diagnosis present

## 2019-05-19 DIAGNOSIS — Z85038 Personal history of other malignant neoplasm of large intestine: Secondary | ICD-10-CM

## 2019-05-19 DIAGNOSIS — I483 Typical atrial flutter: Secondary | ICD-10-CM | POA: Diagnosis not present

## 2019-05-19 DIAGNOSIS — M109 Gout, unspecified: Secondary | ICD-10-CM | POA: Diagnosis present

## 2019-05-19 DIAGNOSIS — J9811 Atelectasis: Secondary | ICD-10-CM

## 2019-05-19 DIAGNOSIS — Z87891 Personal history of nicotine dependence: Secondary | ICD-10-CM

## 2019-05-19 DIAGNOSIS — I4811 Longstanding persistent atrial fibrillation: Secondary | ICD-10-CM | POA: Diagnosis present

## 2019-05-19 DIAGNOSIS — Z79899 Other long term (current) drug therapy: Secondary | ICD-10-CM

## 2019-05-19 DIAGNOSIS — R0902 Hypoxemia: Secondary | ICD-10-CM | POA: Diagnosis not present

## 2019-05-19 DIAGNOSIS — J9819 Other pulmonary collapse: Secondary | ICD-10-CM | POA: Diagnosis present

## 2019-05-19 DIAGNOSIS — Z20828 Contact with and (suspected) exposure to other viral communicable diseases: Secondary | ICD-10-CM | POA: Diagnosis present

## 2019-05-19 DIAGNOSIS — J189 Pneumonia, unspecified organism: Secondary | ICD-10-CM | POA: Diagnosis present

## 2019-05-19 DIAGNOSIS — Z7901 Long term (current) use of anticoagulants: Secondary | ICD-10-CM

## 2019-05-19 DIAGNOSIS — J9600 Acute respiratory failure, unspecified whether with hypoxia or hypercapnia: Secondary | ICD-10-CM

## 2019-05-19 DIAGNOSIS — E669 Obesity, unspecified: Secondary | ICD-10-CM | POA: Diagnosis present

## 2019-05-19 DIAGNOSIS — J9620 Acute and chronic respiratory failure, unspecified whether with hypoxia or hypercapnia: Secondary | ICD-10-CM | POA: Diagnosis present

## 2019-05-19 DIAGNOSIS — J9621 Acute and chronic respiratory failure with hypoxia: Secondary | ICD-10-CM | POA: Diagnosis not present

## 2019-05-19 DIAGNOSIS — Z8701 Personal history of pneumonia (recurrent): Secondary | ICD-10-CM

## 2019-05-19 DIAGNOSIS — E875 Hyperkalemia: Secondary | ICD-10-CM | POA: Diagnosis not present

## 2019-05-19 DIAGNOSIS — J449 Chronic obstructive pulmonary disease, unspecified: Secondary | ICD-10-CM | POA: Diagnosis present

## 2019-05-19 DIAGNOSIS — I428 Other cardiomyopathies: Secondary | ICD-10-CM | POA: Diagnosis not present

## 2019-05-19 DIAGNOSIS — Z9981 Dependence on supplemental oxygen: Secondary | ICD-10-CM

## 2019-05-19 DIAGNOSIS — I361 Nonrheumatic tricuspid (valve) insufficiency: Secondary | ICD-10-CM | POA: Diagnosis not present

## 2019-05-19 DIAGNOSIS — R0602 Shortness of breath: Secondary | ICD-10-CM | POA: Diagnosis not present

## 2019-05-19 DIAGNOSIS — D72829 Elevated white blood cell count, unspecified: Secondary | ICD-10-CM | POA: Diagnosis not present

## 2019-05-19 LAB — URINALYSIS, ROUTINE W REFLEX MICROSCOPIC
Bacteria, UA: NONE SEEN
Bilirubin Urine: NEGATIVE
Glucose, UA: NEGATIVE mg/dL
Hgb urine dipstick: NEGATIVE
Ketones, ur: NEGATIVE mg/dL
Nitrite: NEGATIVE
Protein, ur: 100 mg/dL — AB
Specific Gravity, Urine: 1.015 (ref 1.005–1.030)
pH: 5 (ref 5.0–8.0)

## 2019-05-19 LAB — LACTIC ACID, PLASMA: Lactic Acid, Venous: 1.4 mmol/L (ref 0.5–1.9)

## 2019-05-19 LAB — BASIC METABOLIC PANEL
Anion gap: 16 — ABNORMAL HIGH (ref 5–15)
BUN: 59 mg/dL — ABNORMAL HIGH (ref 8–23)
CO2: 34 mmol/L — ABNORMAL HIGH (ref 22–32)
Calcium: 9.9 mg/dL (ref 8.9–10.3)
Chloride: 90 mmol/L — ABNORMAL LOW (ref 98–111)
Creatinine, Ser: 2.86 mg/dL — ABNORMAL HIGH (ref 0.61–1.24)
GFR calc Af Amer: 23 mL/min — ABNORMAL LOW (ref >60)
GFR calc non Af Amer: 19 mL/min — ABNORMAL LOW (ref >60)
Glucose, Bld: 129 mg/dL — ABNORMAL HIGH (ref 70–99)
Potassium: 4.4 mmol/L (ref 3.5–5.1)
Sodium: 140 mmol/L (ref 135–145)

## 2019-05-19 LAB — PROCALCITONIN: Procalcitonin: 2.52 ng/mL

## 2019-05-19 LAB — STREP PNEUMONIAE URINARY ANTIGEN: Strep Pneumo Urinary Antigen: NEGATIVE

## 2019-05-19 LAB — CBC WITH DIFFERENTIAL/PLATELET
Abs Immature Granulocytes: 0.04 10*3/uL (ref 0.00–0.07)
Basophils Absolute: 0 10*3/uL (ref 0.0–0.1)
Basophils Relative: 0 %
Eosinophils Absolute: 0.7 10*3/uL — ABNORMAL HIGH (ref 0.0–0.5)
Eosinophils Relative: 5 %
HCT: 34.1 % — ABNORMAL LOW (ref 39.0–52.0)
Hemoglobin: 10 g/dL — ABNORMAL LOW (ref 13.0–17.0)
Immature Granulocytes: 0 %
Lymphocytes Relative: 10 %
Lymphs Abs: 1.3 10*3/uL (ref 0.7–4.0)
MCH: 31.9 pg (ref 26.0–34.0)
MCHC: 29.3 g/dL — ABNORMAL LOW (ref 30.0–36.0)
MCV: 108.9 fL — ABNORMAL HIGH (ref 80.0–100.0)
Monocytes Absolute: 1.5 10*3/uL — ABNORMAL HIGH (ref 0.1–1.0)
Monocytes Relative: 12 %
Neutro Abs: 9.2 10*3/uL — ABNORMAL HIGH (ref 1.7–7.7)
Neutrophils Relative %: 73 %
Platelets: 237 10*3/uL (ref 150–400)
RBC: 3.13 MIL/uL — ABNORMAL LOW (ref 4.22–5.81)
RDW: 16.1 % — ABNORMAL HIGH (ref 11.5–15.5)
WBC: 12.7 10*3/uL — ABNORMAL HIGH (ref 4.0–10.5)
nRBC: 0 % (ref 0.0–0.2)

## 2019-05-19 LAB — BRAIN NATRIURETIC PEPTIDE: B Natriuretic Peptide: 465.7 pg/mL — ABNORMAL HIGH (ref 0.0–100.0)

## 2019-05-19 LAB — POCT I-STAT 7, (LYTES, BLD GAS, ICA,H+H)
Acid-Base Excess: 7 mmol/L — ABNORMAL HIGH (ref 0.0–2.0)
Bicarbonate: 34.6 mmol/L — ABNORMAL HIGH (ref 20.0–28.0)
Calcium, Ion: 1.22 mmol/L (ref 1.15–1.40)
HCT: 28 % — ABNORMAL LOW (ref 39.0–52.0)
Hemoglobin: 9.5 g/dL — ABNORMAL LOW (ref 13.0–17.0)
O2 Saturation: 91 %
Patient temperature: 98.6
Potassium: 4.7 mmol/L (ref 3.5–5.1)
Sodium: 140 mmol/L (ref 135–145)
TCO2: 36 mmol/L — ABNORMAL HIGH (ref 22–32)
pCO2 arterial: 63.7 mmHg — ABNORMAL HIGH (ref 32.0–48.0)
pH, Arterial: 7.342 — ABNORMAL LOW (ref 7.350–7.450)
pO2, Arterial: 68 mmHg — ABNORMAL LOW (ref 83.0–108.0)

## 2019-05-19 LAB — MAGNESIUM: Magnesium: 1.8 mg/dL (ref 1.7–2.4)

## 2019-05-19 LAB — SARS CORONAVIRUS 2 BY RT PCR (HOSPITAL ORDER, PERFORMED IN ~~LOC~~ HOSPITAL LAB): SARS Coronavirus 2: NEGATIVE

## 2019-05-19 LAB — SEDIMENTATION RATE: Sed Rate: 84 mm/hr — ABNORMAL HIGH (ref 0–16)

## 2019-05-19 LAB — PHOSPHORUS: Phosphorus: 4.1 mg/dL (ref 2.5–4.6)

## 2019-05-19 LAB — TROPONIN I: Troponin I: <0.03 ng/mL (ref <0.03)

## 2019-05-19 LAB — TSH: TSH: 4.099 u[IU]/mL (ref 0.350–4.500)

## 2019-05-19 LAB — GLUCOSE, CAPILLARY: Glucose-Capillary: 110 mg/dL — ABNORMAL HIGH (ref 70–99)

## 2019-05-19 LAB — MRSA PCR SCREENING: MRSA by PCR: NEGATIVE

## 2019-05-19 MED ORDER — ASPIRIN 81 MG PO CHEW
324.0000 mg | CHEWABLE_TABLET | Freq: Once | ORAL | Status: AC
  Administered 2019-05-19: 324 mg via ORAL
  Filled 2019-05-19: qty 4

## 2019-05-19 MED ORDER — SODIUM CHLORIDE 0.9% FLUSH
3.0000 mL | Freq: Two times a day (BID) | INTRAVENOUS | Status: DC
  Administered 2019-05-19 – 2019-05-30 (×21): 3 mL via INTRAVENOUS

## 2019-05-19 MED ORDER — VANCOMYCIN HCL IN DEXTROSE 1-5 GM/200ML-% IV SOLN: 1000.0000 mg | INTRAVENOUS | Status: DC

## 2019-05-19 MED ORDER — SODIUM CHLORIDE 0.9% FLUSH: 3.0000 mL | INTRAVENOUS | Status: DC | PRN

## 2019-05-19 MED ORDER — FUROSEMIDE 10 MG/ML IJ SOLN
40.0000 mg | Freq: Once | INTRAMUSCULAR | Status: AC
  Administered 2019-05-19: 40 mg via INTRAVENOUS
  Filled 2019-05-19: qty 4

## 2019-05-19 MED ORDER — HEPARIN (PORCINE) 25000 UT/250ML-% IV SOLN
1200.0000 [IU]/h | INTRAVENOUS | Status: DC
  Administered 2019-05-19: 1300 [IU]/h via INTRAVENOUS
  Administered 2019-05-20 – 2019-05-21 (×2): 1100 [IU]/h via INTRAVENOUS
  Administered 2019-05-22 – 2019-05-23 (×2): 1200 [IU]/h via INTRAVENOUS
  Filled 2019-05-19 (×6): qty 250

## 2019-05-19 MED ORDER — SODIUM CHLORIDE 0.9 % IV BOLUS
1000.0000 mL | Freq: Once | INTRAVENOUS | Status: AC
  Administered 2019-05-19: 1000 mL via INTRAVENOUS

## 2019-05-19 MED ORDER — SODIUM CHLORIDE 0.9 % IV SOLN
2.0000 g | Freq: Once | INTRAVENOUS | Status: AC
  Administered 2019-05-19: 2 g via INTRAVENOUS
  Filled 2019-05-19: qty 2

## 2019-05-19 MED ORDER — VANCOMYCIN HCL 10 G IV SOLR
2000.0000 mg | Freq: Once | INTRAVENOUS | Status: AC
  Administered 2019-05-19: 2000 mg via INTRAVENOUS
  Filled 2019-05-19: qty 2000

## 2019-05-19 MED ORDER — AMIODARONE HCL 200 MG PO TABS
200.0000 mg | ORAL_TABLET | Freq: Every day | ORAL | Status: DC
  Administered 2019-05-20 – 2019-05-30 (×11): 200 mg via ORAL
  Filled 2019-05-19 (×11): qty 1

## 2019-05-19 MED ORDER — SODIUM CHLORIDE 0.9 % IV SOLN: 250.0000 mL | INTRAVENOUS | Status: DC | PRN

## 2019-05-19 MED ORDER — ACETAMINOPHEN 325 MG PO TABS: 650.0000 mg | ORAL_TABLET | ORAL | Status: DC | PRN

## 2019-05-19 MED ORDER — SODIUM CHLORIDE 0.9 % IV SOLN
510.0000 mg | INTRAVENOUS | Status: AC
  Administered 2019-05-24: 510 mg via INTRAVENOUS
  Filled 2019-05-19: qty 17

## 2019-05-19 MED ORDER — RIVAROXABAN 15 MG PO TABS
15.0000 mg | ORAL_TABLET | Freq: Every day | ORAL | Status: DC
  Filled 2019-05-19: qty 1

## 2019-05-19 MED ORDER — NITROGLYCERIN 0.4 MG SL SUBL
0.4000 mg | SUBLINGUAL_TABLET | SUBLINGUAL | Status: DC | PRN
  Administered 2019-05-19: 09:00:00 0.4 mg via SUBLINGUAL
  Filled 2019-05-19: qty 1

## 2019-05-19 MED ORDER — BISOPROLOL FUMARATE 5 MG PO TABS
5.0000 mg | ORAL_TABLET | Freq: Every day | ORAL | Status: DC
  Administered 2019-05-20 – 2019-05-30 (×11): 5 mg via ORAL
  Filled 2019-05-19 (×12): qty 1

## 2019-05-19 MED ORDER — ONDANSETRON HCL 4 MG/2ML IJ SOLN: 4.0000 mg | Freq: Four times a day (QID) | INTRAMUSCULAR | Status: DC | PRN

## 2019-05-19 MED ORDER — FERROUS SULFATE 325 (65 FE) MG PO TABS
325.0000 mg | ORAL_TABLET | Freq: Every day | ORAL | Status: DC
  Administered 2019-05-20 – 2019-05-30 (×11): 325 mg via ORAL
  Filled 2019-05-19 (×11): qty 1

## 2019-05-19 MED ORDER — SODIUM CHLORIDE 0.9 % IV SOLN
2.0000 g | INTRAVENOUS | Status: DC
  Administered 2019-05-20 – 2019-05-24 (×5): 2 g via INTRAVENOUS
  Filled 2019-05-19 (×5): qty 2

## 2019-05-19 MED ORDER — SODIUM CHLORIDE 0.9 % IV BOLUS: 500.0000 mL | Freq: Once | INTRAVENOUS | Status: DC

## 2019-05-19 MED ORDER — SODIUM CHLORIDE 0.9 % IV SOLN
1.0000 g | Freq: Once | INTRAVENOUS | Status: DC
  Filled 2019-05-19: qty 1

## 2019-05-19 MED ORDER — ALBUTEROL SULFATE (2.5 MG/3ML) 0.083% IN NEBU
2.5000 mg | INHALATION_SOLUTION | RESPIRATORY_TRACT | Status: DC | PRN
  Administered 2019-05-20: 2.5 mg via RESPIRATORY_TRACT
  Filled 2019-05-19: qty 3

## 2019-05-19 NOTE — ED Notes (Signed)
Pulmonology complete with exam, will admit patient.. Oxygen turned down to 10L/ [place] and patient able to maintain levels for only a brief while.  Sats reduced to low 80's and RT called for BIPAP.   Here now with machine, pt remains alert and able to give answers to MD's.  MD instructed to call wife.

## 2019-05-19 NOTE — Progress Notes (Signed)
RT note: RT and RN transported BIPAP to 3M13. Patients vital signs stable through out.

## 2019-05-19 NOTE — Progress Notes (Signed)
Updated pts wife on status.  Irven [place], RN

## 2019-05-19 NOTE — Progress Notes (Signed)
Patient requested to be removed from the BIPAP. Patient was initially placed on 6L [place]. After 2 hours patient had to be increased to 14L [place] mask. Patient was educated about the importance and necessity of the BiPAP. Patient continued to refuse BiPAP for now. WiIl continue to monitor.

## 2019-05-19 NOTE — H&P (Signed)
NAME:  David Barton, MRN:  [id number], DOB:  [birth year], LOS: 0 ADMISSION DATE:  05/19/2019, CONSULTATION DATE:  5/30  REFERRING MD:  Lanny Cramp PA  CHIEF COMPLAINT:  Hypoxia   Brief History   85 year old male former smoker with known moderate COPD presenting today emergency room May 19, 2019 with chest pain and shortness of breath.  Patient is on home oxygen at 3.5 L.  On presentation found to have significant hypoxemia with increased oxygen demands bilateral airspace disease right greater than left With right lower lobe collapse and right pleural effusion.  PCCM called for admission  History of present illness   85 year old male former smoker with underlying moderate COPD and oxygen dependent respiratory failure.  Patient lives at home with his wife.  He presents to the emergency room on May 19, 2019 with chest pain and shortness of breath , found to have hypoxemia and increased oxygen demands. Recent prolonged admission in March-April for  pneumonia and septic shock.  Complicated by new onset A. fib with RVR and worsening renal failure. Patient was recently seen for a pulmonary consult with Dr. Melvyn Novas in the outpatient setting.  Patient was seen for a post hospital follow-up for recent pneumonia.  Patient was having worsening shortness of breath , cough , blood tinged mucus and increased oxygen demands at 4 L.  Chest x-ray showed multifocal airspace disease on the right. Patient was treated for a pneumonia with Levaquin x7 days.. Follow-up chest x-ray on April 04, 2019 showed movement in the right upper lobe with residual right lower lobe disease and small right effusion.  And stable left lower lobe airspace disease. Patient did have an elevated sed rate , concern for possible amiodarone pulmonary toxicity was considered , however chest after antibiotics on 5/15 showed improvement.  Patient says over the last 2 weeks breathing has gotten worse in the morning of arrival to the ER he had chest pain and  worsening shortness of breath. He has been afebrile.  Blood pressure was stable.  White blood cell count was mildly elevated at 12.7.  ED course: Chest x-ray with increased collapse of the right lower lobe diffuse opacities to the right lung.  And a right pleural effusion, lactic acid 1.4.  Troponin 0 0.03.  BNP is 465, creatinine 2.86, potassium 4.4.,  COVID-19 SARS NEG .     Past Medical History   A. fib on Xarelto, diastolic heart failure, cardiomyopathy, Chronic kidney disease, colon cancer history, EtOH abuse history  Significant Hospital Events     Consults:    Procedures:    Significant Diagnostic Tests:    Micro Data:  5/30 BC x 2 >>   Antimicrobials:  5/30 Maxipime>> 5/30 Vanc >>   Interim history/subjective:  O2 sats 70s-low 80s on 6l/m [place]  Alert and talking, following commands.   Objective   Blood pressure (!) 111/59, pulse (!) 122, temperature 98.4 F (36.9 C), temperature source Oral, resp. rate (!) 26, height '5\' 10"'$  (1.778 m), weight 99.8 kg, SpO2 (!) 77 %.        Intake/Output Summary (Last 24 hours) at 05/19/2019 1357 Last data filed at 05/19/2019 1317 Gross per 24 hour  Intake 100 ml  Output -  Net 100 ml   Filed Weights   05/19/19 0849  Weight: 99.8 kg    Examination: General: Elderly male mild respiratory distress on nasal cannula HENT: AT/[place], PERRLA, oral mucosa dry Lungs: Coarse breath sounds bilaterally with a few bibasilar crackles Cardiovascular: Irregular regular  no MRG Abdomen: Morbidly obese, bowel sounds positive, nontender no guarding Extremities: Warm, dry, trace to 1+ edema Neuro: Intact, alert and oriented x3, appropriate, follows commands, moves all extremities GU: Voids  Resolved Hospital Problem list     Assessment & Plan:  Acute on chronic hypoxic respiratory failure Bilateral airspace disease right greater than left with suspected  H CAP +/- volume overload . Patient is on amiodarone need to monitor for pulmonary  toxicity .  COPD   Plan  BIPAP support .  Continue empiric abx with maxipime and vanc  Tr PCT  Tr wbc/temp  Duoneb Three times a day   Check ESR  Consider swallow evaluation (?aspiration)  going forward  Consider thoracentesis if effusion not resolving  Consider CT chest to better evaluate area and volume loss.  Check cxr in am  Add Flutter valve   CKD -followed by renal OP setting  Scr slightly improved 3.07 >2.86   Plan  Strict I/O  Monitor and replace electrolytes  Avoid nephrotoxins     A FIb  DCHF -echo 02/2019 EF 60-65% BNP elevated at 443  Amio toxicity low suspicion .   Plan  Continue home cardizem, and bisoprolol  Change Xarelto to IV HEP  Continue Amiodarone  Lasix '40mg'$  IV x 1 (on home lasix '60mg'$  daily ) Check TSH  , ESR   Anemia  Hbg stable   Plan  Tr cbc   Best practice:  Diet:  NPO  Pain/Anxiety/Delirium protocol (if indicated): na  VAP protocol (if indicated): na DVT prophylaxis:  xarelto  GI prophylaxis: na  Glucose control: na Mobility: BR  Code Status: DNR  Family Communication: Will update wife  Disposition: ICU   05/19/19 > Discussion with patient regarding code status , previously DNR. Patient confirms he wants a DNR status.   Labs   CBC: Recent Labs  Lab 05/17/19 0940 05/19/19 0901  WBC  --  12.7*  NEUTROABS  --  9.2*  HGB 9.0* 10.0*  HCT  --  34.1*  MCV  --  108.9*  PLT  --  321    Basic Metabolic Panel: Recent Labs  Lab 05/19/19 0901  NA 140  K 4.4  CL 90*  CO2 34*  GLUCOSE 129*  BUN 59*  CREATININE 2.86*  CALCIUM 9.9   GFR: Estimated Creatinine Clearance: 23.2 mL/min (A) (by C-G formula based on SCr of 2.86 mg/dL (H)). Recent Labs  Lab 05/19/19 0901 05/19/19 1140  WBC 12.7*  --   LATICACIDVEN  --  1.4    Liver Function Tests: No results for input(s): AST, ALT, ALKPHOS, BILITOT, PROT, ALBUMIN in the last 168 hours. No results for input(s): LIPASE, AMYLASE in the last 168 hours. No results for  input(s): AMMONIA in the last 168 hours.  ABG    Component Value Date/Time   PHART 7.253 (L) 03/05/2019 0317   PCO2ART 57.3 (H) 03/05/2019 0317   PO2ART 52.0 (L) 03/05/2019 0317   HCO3 25.5 03/05/2019 0317   TCO2 27 03/05/2019 0317   ACIDBASEDEF 3.0 (H) 03/05/2019 0317   O2SAT 81.0 03/05/2019 0317     Coagulation Profile: No results for input(s): INR, PROTIME in the last 168 hours.  Cardiac Enzymes: Recent Labs  Lab 05/19/19 0901  TROPONINI <0.03    HbA1C: Hgb A1c MFr Bld  Date/Time Value Ref Range Status  11/05/2016 03:07 AM 5.2 4.8 - 5.6 % Final    Comment:    (NOTE)  Pre-diabetes: 5.7 - 6.4         Diabetes: >6.4         Glycemic control for adults with diabetes: <7.0     CBG: No results for input(s): GLUCAP in the last 168 hours.  Review of Systems:   Constitutional:   No  weight loss, night sweats,  Fevers, chills, +fatigue, or  lassitude.  HEENT:   No headaches,  Difficulty swallowing,  Tooth/dental problems, or  Sore throat,                No sneezing, itching, ear ache, nasal congestion, post nasal drip,   CV:  +chest pain,  + Orthopnea, PND, +swelling in lower extremities, No nasarca, dizziness, palpitations, syncope.   GI  No heartburn, indigestion, abdominal pain, nausea, vomiting, diarrhea, change in bowel habits, loss of appetite, bloody stools.   Resp: ++shortness of breath with exertion or at rest.  No excess mucus, no productive cough,  No non-productive cough,  No coughing up of blood.  No change in color of mucus.  No wheezing.  No chest wall deformity  Skin: no rash or lesions.  GU: no dysuria, change in color of urine, no urgency or frequency.  No flank pain, no hematuria   MS:  No joint pain or swelling.  No decreased range of motion.  No back pain.  Psych:  No change in mood or affect. No depression or anxiety.  No memory loss.       Past Medical History  He,  has a past medical history of Alcohol abuse, Colon cancer ([place]),  COPD (chronic obstructive pulmonary disease) ([place]), Emphysema of lung ([place]), Former tobacco use, Hypertension, Peripheral edema, and Sepsis ([place]) (10/2016).   Surgical History    Past Surgical History:  Procedure Laterality Date  . CARDIOVERSION N/A 03/16/2019   Procedure: CARDIOVERSION;  Surgeon: Pixie Casino, MD;  Location: [facility] ENDOSCOPY;  Service: Cardiovascular;  Laterality: N/A;  . CARDIOVERSION N/A 03/23/2019   Procedure: CARDIOVERSION;  Surgeon: Lelon Perla, MD;  Location: [facility] ENDOSCOPY;  Service: Cardiovascular;  Laterality: N/A;  . CATARACT EXTRACTION    . COLON RESECTION    . COLONOSCOPY    . IMPLANTATION BONE ANCHORED HEARING AID Left 2016  . MINOR HEMORRHOIDECTOMY  1995  . TEE WITHOUT CARDIOVERSION N/A 03/16/2019   Procedure: TRANSESOPHAGEAL ECHOCARDIOGRAM (TEE);  Surgeon: Pixie Casino, MD;  Location[facility] ENDOSCOPY;  Service: Cardiovascular;  Laterality: N/A;  . TONSILLECTOMY       Social History   reports that he quit smoking about 37 years ago. His smoking use included cigarettes. He has a 20.00 pack-year smoking history. He has never used smokeless tobacco. He reports current alcohol use of about 7.0 standard drinks of alcohol per week. He reports that he does not use drugs.   Family History   His family history includes Brain cancer in his father; Colon cancer in his mother; Lung cancer in his brother.   Allergies Allergies  Allergen Reactions  . Flexeril [Cyclobenzaprine] Other (See Comments)    Unknown reaction      Home Medications  Prior to Admission medications   Medication Sig Start Date End Date Taking? Authorizing Provider  allopurinol (ZYLOPRIM) 100 MG tablet Take 100 mg by mouth daily. 02/17/19  Yes [provider]  amiodarone (PACERONE) 200 MG tablet Take 1 tablet (200 mg total) by mouth daily. 04/17/19  Yes Kilroy, Luke K, PA-C  bisoprolol (ZEBETA) 5 MG tablet Take 5 mg by mouth  daily.   Yes [provider]  diltiazem (CARDIZEM  CD) 120 MG 24 hr capsule Take 1 capsule (120 mg total) by mouth at bedtime. 04/23/19  Yes Lorretta Harp, MD  ferrous sulfate 325 (65 FE) MG tablet Take 325 mg by mouth daily.    Yes [provider]  furosemide (LASIX) 40 MG tablet Take 60 mg by mouth daily.    Yes [provider]  Multiple Vitamin (MULTIVITAMIN WITH MINERALS) TABS tablet Take 1 tablet by mouth daily.   Yes [provider]  OXYGEN Inhale 4 L into the lungs continuous.   Yes [provider]  Rivaroxaban (XARELTO) 15 MG TABS tablet Take 1 tablet (15 mg total) by mouth daily with supper. 04/23/19  Yes Lorretta Harp, MD  ipratropium-albuterol (DUONEB) 0.5-2.5 (3) MG/3ML SOLN Use twice a day scheduled and every 4 hours as needed for shortness of breath and wheezing Patient not taking: Reported on 05/19/2019 03/26/19   Thurnell Lose, MD     Critical care time: 44 min      Kol Consuegra NP-C  [facility]  [phone]  05/19/2019

## 2019-05-19 NOTE — ED Triage Notes (Signed)
Woke at 0530 with midsternal CP without other noted sx.  Denies hx pf heart dx.  No N/V/D, some noted SOB.   Alert and oriented.  No outward pain responses.

## 2019-05-19 NOTE — Progress Notes (Signed)
ANTICOAGULATION CONSULT NOTE - Initial Consult  Pharmacy Consult for xarelto Indication: atrial fibrillation  Allergies  Allergen Reactions  . Flexeril [Cyclobenzaprine] Other (See Comments)    Unknown reaction     Patient Measurements: Height: 5\' 10"  (177.8 cm) Weight: 220 lb (99.8 kg) IBW/kg (Calculated) : 73  Vital Signs: Temp: 98.4 F (36.9 C) (05/30 0847) Temp Source: Oral (05/30 0847) BP: 111/59 (05/30 1315) Pulse Rate: 82 (05/30 1407)  Labs: Recent Labs    05/17/19 0940 05/19/19 0901  HGB 9.0* 10.0*  HCT  --  34.1*  PLT  --  237  CREATININE  --  2.86*  TROPONINI  --  <0.03    Estimated Creatinine Clearance: 23.2 mL/min (A) (by C-G formula based on SCr of 2.86 mg/dL (H)).   Medical History: Past Medical History:  Diagnosis Date  . Alcohol abuse   . Colon cancer ([place])   . COPD (chronic obstructive pulmonary disease) ([place])   . Emphysema of lung ([place])   . Former tobacco use   . Hypertension   . Peripheral edema   . Sepsis ([place]) 10/2016   Aspiration PNA/C diff colitis   Assessment: 89 yom presented to the ED with CP. He is on chronic xarelto for history of afib. Xarelto to continue while admitted. Pt with elevated Scr but this is consistent with recent results. Hgb is slightly low but platelets are WNL. No bleeding noted. Will need to monitor renal function closely.   Goal of Therapy:  Stroke prevention Monitor platelets by anticoagulation protocol: Yes   Plan:  Xarelto 15mg  PO daily  F/u renal fxn, S&S of bleeding  Emanuell Morina, Rande Lawman 05/19/2019,2:48 PM

## 2019-05-19 NOTE — ED Notes (Signed)
Placed on NRB

## 2019-05-19 NOTE — ED Notes (Signed)
Spoke with wife again on the phone with current updates regarding Abx and infection. Wife states would like to discuss with MD at [facility].   PA aware.

## 2019-05-19 NOTE — ED Notes (Signed)
Attempt to wean pt to RA unsuccessful, quickly desats to the 70's.  Oxygen replaced at 4L/[place], which he uses at home.  Hx of anemia and recent transfusion.

## 2019-05-19 NOTE — ED Provider Notes (Addendum)
[facility] EMERGENCY DEPARTMENT Provider Note   CSN: [id number] Arrival date & time: 05/19/19  2585    History   Chief Complaint No chief complaint on file.   HPI David Barton is a 85 y.o. male with history of hypertension, COPD, atrial fibrillation anticoagulated on Xarelto, CHF who presents with chest pain that began around 0530 this morning.  He describes it as an ache on either side of his chest.  It radiates to his left neck.  He has shortness of breath at baseline and it has been a little worse.  He is on 3-1/2 L of oxygen at baseline.  He had to bump it up to 4 this morning.  He denies any abdominal pain, nausea, vomiting.  Patient reports he has had pain like this 1 time a couple weeks ago after he had ginger ale, however it went away.  Patient was not eating or drinking anything prior to the onset of pain this morning, however he reports he did have a nonalcoholic beer last night.     HPI  Past Medical History:  Diagnosis Date   Alcohol abuse    Colon cancer ([place])    COPD (chronic obstructive pulmonary disease) ([place])    Emphysema of lung ([place])    Former tobacco use    Hypertension    Peripheral edema    Sepsis ([place]) 10/2016   Aspiration PNA/C diff colitis    Patient Active Problem List   Diagnosis Date Noted   Acute on chronic respiratory failure ([place]) 05/19/2019   Chronic respiratory failure with hypoxia and hypercapnia ([place]) 05/06/2019   DOE (dyspnea on exertion) 05/04/2019   HCAP (healthcare-associated pneumonia) 04/25/2019   Chronic respiratory failure with hypoxia ([place]) 04/25/2019   History of anemia due to chronic kidney disease 04/17/2019   Pressure injury of skin 03/17/2019   Atrial fibrillation with RVR ([name]) 03/11/2019   Chronic diastolic heart failure ([place]) 01/17/2019   Edema of both legs 01/02/2019   Pulmonary edema    Hypomagnesemia 11/15/2016   Sleep apnea    Sepsis ([place]) 11/10/2016   Elevated  troponin 27/78/2423   Acute diastolic CHF (congestive heart failure) ([place]) 11/10/2016   Cardiomyopathy ([place]) 11/10/2016   PSVT (paroxysmal supraventricular tachycardia) ([place]) 11/10/2016   Dysphagia    Renal insufficiency    Enteritis due to Clostridium difficile    Aspiration pneumonia ([place]) 11/04/2016   Diarrhea 11/04/2016   AKI (acute kidney injury) ([place]) 11/04/2016   Hyperkalemia 11/04/2016   Community acquired pneumonia of right lung 11/03/2016   Essential hypertension 05/31/2013   COPD with acute exacerbation ([place]) 05/29/2013   Hypokalemia 05/08/2013   Hypotension 05/08/2013   Dizziness 05/08/2013   Hypoxia 05/08/2013   Acute respiratory failure ([place]) 05/08/2013    Past Surgical History:  Procedure Laterality Date   CARDIOVERSION N/A 03/16/2019   Procedure: CARDIOVERSION;  Surgeon: Pixie Casino, MD;  Location: [facility];  Service: Cardiovascular;  Laterality: N/A;   CARDIOVERSION N/A 03/23/2019   Procedure: CARDIOVERSION;  Surgeon: Lelon Perla, MD;  Location: [facility];  Service: Cardiovascular;  Laterality: N/A;   CATARACT EXTRACTION     COLON RESECTION     COLONOSCOPY     IMPLANTATION BONE ANCHORED HEARING AID Left 2016   MINOR HEMORRHOIDECTOMY  1995   TEE WITHOUT CARDIOVERSION N/A 03/16/2019   Procedure: TRANSESOPHAGEAL ECHOCARDIOGRAM (TEE);  Surgeon: Pixie Casino, MD;  Location: [facility];  Service: Cardiovascular;  Laterality: N/A;   TONSILLECTOMY  Home Medications    Prior to Admission medications   Medication Sig Start Date End Date Taking? Authorizing Provider  allopurinol (ZYLOPRIM) 100 MG tablet Take 100 mg by mouth daily. 02/17/19  Yes [provider]  amiodarone (PACERONE) 200 MG tablet Take 1 tablet (200 mg total) by mouth daily. 04/17/19  Yes Kilroy, Luke K, PA-C  bisoprolol (ZEBETA) 5 MG tablet Take 5 mg by mouth daily.   Yes [provider]  diltiazem (CARDIZEM CD) 120 MG 24 hr  capsule Take 1 capsule (120 mg total) by mouth at bedtime. 04/23/19  Yes Lorretta Harp, MD  ferrous sulfate 325 (65 FE) MG tablet Take 325 mg by mouth daily.    Yes [provider]  furosemide (LASIX) 40 MG tablet Take 60 mg by mouth daily.    Yes [provider]  Multiple Vitamin (MULTIVITAMIN WITH MINERALS) TABS tablet Take 1 tablet by mouth daily.   Yes [provider]  OXYGEN Inhale 4 L into the lungs continuous.   Yes [provider]  Rivaroxaban (XARELTO) 15 MG TABS tablet Take 1 tablet (15 mg total) by mouth daily with supper. 04/23/19  Yes Lorretta Harp, MD  ipratropium-albuterol (DUONEB) 0.5-2.5 (3) MG/3ML SOLN Use twice a day scheduled and every 4 hours as needed for shortness of breath and wheezing Patient not taking: Reported on 05/19/2019 03/26/19   Thurnell Lose, MD    Family History Family History  Problem Relation Age of Onset   Brain cancer Father    Colon cancer Mother        with mets to liver   Lung cancer Brother        was a smoker    Social History Social History   Tobacco Use   Smoking status: Former Smoker    Packs/day: 1.00    Years: 20.00    Pack years: 20.00    Types: Cigarettes    Last attempt to quit: 12/20/1981    Years since quitting: 37.4   Smokeless tobacco: Never Used  Substance Use Topics   Alcohol use: Yes    Alcohol/week: 7.0 standard drinks    Types: 7 Standard drinks or equivalent per week    Comment: states he drinks 3-4 days a week, "a couple" on those occasions but does not further quaniity.   Drug use: No     Allergies   Flexeril [cyclobenzaprine]   Review of Systems Review of Systems  Constitutional: Negative for chills and fever.  HENT: Negative for facial swelling and sore throat.   Respiratory: Positive for cough (just this morning) and shortness of breath.   Cardiovascular: Positive for chest pain.  Gastrointestinal: Negative for abdominal pain, nausea and vomiting.   Genitourinary: Negative for dysuria.  Musculoskeletal: Positive for neck pain (radiation from chest). Negative for back pain.  Skin: Negative for rash and wound.  Neurological: Negative for headaches.  Psychiatric/Behavioral: The patient is not nervous/anxious.      Physical Exam Updated Vital Signs BP 119/73    Pulse 88    Temp 98.4 F (36.9 C) (Oral) Comment: Simultaneous filing. User may not have seen previous data. Comment (Src): Simultaneous filing. User may not have seen previous data.   Resp (!) 26    Ht 5\' 10"  (1.778 m)    Wt 99.8 kg    SpO2 96%    BMI 31.57 kg/m   Physical Exam Vitals signs and nursing note reviewed.  Constitutional:      General: He  is not in acute distress.    Appearance: He is well-developed. He is not diaphoretic.  HENT:     Head: Normocephalic and atraumatic.     Mouth/Throat:     Pharynx: No oropharyngeal exudate.  Eyes:     General: No scleral icterus.       Right eye: No discharge.        Left eye: No discharge.     Conjunctiva/sclera: Conjunctivae normal.     Pupils: Pupils are equal, round, and reactive to light.  Neck:     Musculoskeletal: Normal range of motion and neck supple.     Thyroid: No thyromegaly.  Cardiovascular:     Rate and Rhythm: Normal rate and regular rhythm.     Heart sounds: Normal heart sounds. No murmur. No friction rub. No gallop.   Pulmonary:     Effort: Pulmonary effort is normal. No respiratory distress.     Breath sounds: No stridor. Wheezing (expiratory wheezing bilaterally) present. No rales.  Abdominal:     General: Bowel sounds are normal. There is no distension.     Palpations: Abdomen is soft.     Tenderness: There is no abdominal tenderness. There is no guarding or rebound.  Lymphadenopathy:     Cervical: No cervical adenopathy.  Skin:    General: Skin is warm and dry.     Coloration: Skin is not pale.     Findings: No rash.  Neurological:     Mental Status: He is alert.     Coordination:  Coordination normal.      ED Treatments / Results  Labs (all labs ordered are listed, but only abnormal results are displayed) Labs Reviewed  BASIC METABOLIC PANEL - Abnormal; Notable for the following components:      Result Value   Chloride 90 (*)    CO2 34 (*)    Glucose, Bld 129 (*)    BUN 59 (*)    Creatinine, Ser 2.86 (*)    GFR calc non Af Amer 19 (*)    GFR calc Af Amer 23 (*)    Anion gap 16 (*)    All other components within normal limits  CBC WITH DIFFERENTIAL/PLATELET - Abnormal; Notable for the following components:   WBC 12.7 (*)    RBC 3.13 (*)    Hemoglobin 10.0 (*)    HCT 34.1 (*)    MCV 108.9 (*)    MCHC 29.3 (*)    RDW 16.1 (*)    Neutro Abs 9.2 (*)    Monocytes Absolute 1.5 (*)    Eosinophils Absolute 0.7 (*)    All other components within normal limits  BRAIN NATRIURETIC PEPTIDE - Abnormal; Notable for the following components:   B Natriuretic Peptide 465.7 (*)    All other components within normal limits  SARS CORONAVIRUS 2 (HOSPITAL ORDER, [facility])  CULTURE, BLOOD (ROUTINE X 2)  CULTURE, BLOOD (ROUTINE X 2)  TROPONIN I  LACTIC ACID, PLASMA  PROCALCITONIN  BRAIN NATRIURETIC PEPTIDE  MAGNESIUM  PHOSPHORUS  URINALYSIS, ROUTINE W REFLEX MICROSCOPIC  STREP PNEUMONIAE URINARY ANTIGEN  LEGIONELLA PNEUMOPHILA SEROGP 1 UR AG  SEDIMENTATION RATE  TSH    EKG EKG Interpretation  Date/Time:  Saturday May 19 2019 12:01:15 EDT Ventricular Rate:  107 PR Interval:    QRS Duration: 171 QT Interval:  391 QTC Calculation: 489 R Axis:   31 Text Interpretation:  Atrial flutter  vs sinusarrhythmia imaired ventricular premature complexes Right bundle  branch block No acute changes Confirmed by Varney Biles [phone]) on 05/19/2019 2:26:20 PM   Radiology Dg Chest Portable 1 View  Result Date: 05/19/2019 CLINICAL DATA:  Chest pain EXAM: PORTABLE CHEST 1 VIEW COMPARISON:  05/04/2019 FINDINGS: There is increasing opacity at the  right lung base with shift of the mediastinum to the right most consistent with increasing collapse in the right lower lobe. Heterogeneous opacities throughout the right lung are stable. Increasing heterogeneous opacities at the left base. Right pleural effusion is suspected. IMPRESSION: Increasing collapse in the right lower lobe. Stable opacities throughout the right lung. Increasing pulmonary opacities at the left lung base. Small right pleural effusion is suspected. Electronically Signed   By: Marybelle Killings M.D.   On: 05/19/2019 10:13    Procedures .Critical Care Performed by: Frederica Kuster, PA-C Authorized by: Frederica Kuster, PA-C   Critical care provider statement:    Critical care time (minutes):  45   Critical care was necessary to treat or prevent imminent or life-threatening deterioration of the following conditions:  Respiratory failure   Critical care was time spent personally by me on the following activities:  Discussions with consultants, evaluation of patient's response to treatment, examination of patient, ordering and performing treatments and interventions, ordering and review of laboratory studies, ordering and review of radiographic studies, pulse oximetry, re-evaluation of patient's condition, obtaining history from patient or surrogate and review of old charts   I assumed direction of critical care for this patient from another provider in my specialty: yes     (including critical care time)  Medications Ordered in ED Medications  nitroGLYCERIN (NITROSTAT) SL tablet 0.4 mg (0.4 mg Sublingual Given 05/19/19 0910)  vancomycin (VANCOCIN) IVPB 1000 mg/200 mL premix (has no administration in time range)  sodium chloride flush (NS) 0.9 % injection 3 mL (has no administration in time range)  sodium chloride flush (NS) 0.9 % injection 3 mL (has no administration in time range)  0.9 %  sodium chloride infusion (has no administration in time range)  acetaminophen (TYLENOL)  tablet 650 mg (has no administration in time range)  ondansetron (ZOFRAN) injection 4 mg (has no administration in time range)  albuterol (PROVENTIL) (2.5 MG/3ML) 0.083% nebulizer solution 2.5 mg (has no administration in time range)  bisoprolol (ZEBETA) tablet 5 mg (has no administration in time range)  ferrous sulfate tablet 325 mg (has no administration in time range)  ceFEPIme (MAXIPIME) 2 g in sodium chloride 0.9 % 100 mL IVPB (has no administration in time range)  Rivaroxaban (XARELTO) tablet 15 mg (has no administration in time range)  furosemide (LASIX) injection 40 mg (has no administration in time range)  amiodarone (PACERONE) tablet 200 mg (has no administration in time range)  aspirin chewable tablet 324 mg (324 mg Oral Given 05/19/19 0909)  ceFEPIme (MAXIPIME) 2 g in sodium chloride 0.9 % 100 mL IVPB (0 g Intravenous Stopped 05/19/19 1317)  vancomycin (VANCOCIN) 2,000 mg in sodium chloride 0.9 % 500 mL IVPB (2,000 mg Intravenous New Bag/Given 05/19/19 1247)  sodium chloride 0.9 % bolus 1,000 mL (0 mLs Intravenous Stopped 05/19/19 1448)     Initial Impression / Assessment and Plan / ED Course  I have reviewed the triage vital signs and the nursing notes.  Pertinent labs & imaging results that were available during my care of the patient were reviewed by me and considered in my medical decision making (see chart for details).        Patient presenting with  respiratory failure as well as chest pain.  Patient had a recent admission is been seeing pulmonology.  Chest x-ray shows increasing collapse in the right lower lobe, increasing pulmonary opacities in the left lung base, and small right pleural effusion.  At the time of chest x-ray result, code sepsis called, as patient did not meet criteria initially.  HCAP Antibiotics and fluids initiated.  Lactic acid negative.  Blood cultures pending.  Patient aggressively requiring more oxygen throughout ED course.  Patient desaturating to the  high 70s, low 80s.  Patient placed on BiPAP.  Leukocytosis at 12.7.  Renal function stable for the patient.  Hemoglobin improved after iron infusion this past week.  I discussed patient case with Critical care who evaluated the patient and accepts the patient for admission.  I appreciate their assistance with the patient.  Patient understands and agrees with plan.  Patient also evaluated by my attending, Dr. Kathrynn Humble, who guided the patient's management and agrees with plan.  Patient is wife updated by me prior to disposition and critical care planned to update at disposition.  David Barton was evaluated in Emergency Department on 05/20/2019 for the symptoms described in the history of present illness. He was evaluated in the context of the global COVID-19 pandemic, which necessitated consideration that the patient might be at risk for infection with the SARS-CoV-2 virus that causes COVID-19. Institutional protocols and algorithms that pertain to the evaluation of patients at risk for COVID-19 are in a state of rapid change based on information released by regulatory bodies including the CDC and federal and state organizations. These policies and algorithms were followed during the patient's care in the ED.   Final Clinical Impressions(s) / ED Diagnoses   Final diagnoses:  Acute on chronic respiratory failure with hypoxia [facility])    ED Discharge Orders    None       Frederica Kuster, PA-C 05/19/19 1550    Frederica Kuster, PA-C 05/20/19 1348    Varney Biles, MD 05/21/19 1651

## 2019-05-19 NOTE — Progress Notes (Addendum)
ANTICOAGULATION CONSULT NOTE - Initial Consult  Pharmacy Consult for heparin Indication: atrial fibrillation  Allergies  Allergen Reactions  . Flexeril [Cyclobenzaprine] Other (See Comments)    Unknown reaction     Patient Measurements: Height: 5\' 10"  (177.8 cm) Weight: 220 lb (99.8 kg) IBW/kg (Calculated) : 73 Heparin Dosing Weight: 93.8kg  Vital Signs: Temp: 98.4 F (36.9 C) (05/30 0847) Temp Source: Oral (05/30 0847) BP: 119/73 (05/30 1500) Pulse Rate: 88 (05/30 1500)  Labs: Recent Labs    05/17/19 0940 05/19/19 0901  HGB 9.0* 10.0*  HCT  --  34.1*  PLT  --  237  CREATININE  --  2.86*  TROPONINI  --  <0.03    Estimated Creatinine Clearance: 23.2 mL/min (A) (by C-G formula based on SCr of 2.86 mg/dL (H)).   Medical History: Past Medical History:  Diagnosis Date  . Alcohol abuse   . Colon cancer ([place])   . COPD (chronic obstructive pulmonary disease) ([place])   . Emphysema of lung ([place])   . Former tobacco use   . Hypertension   . Peripheral edema   . Sepsis ([place]) 10/2016   Aspiration PNA/C diff colitis    Medications:  Infusions:  . sodium chloride    . [START ON 05/20/2019] ceFEPime (MAXIPIME) IV    . [START ON 05/24/2019] ferumoxytol    . heparin    . [START ON 05/21/2019] vancomycin      Assessment: 89 yom presented to the ED with CP. He is on chronic xarelto for history of afib. This will be transitioned to IV heparin. Baseline Hgb is low and platelets are WNL. No bleeding noted.   Goal of Therapy:  Heparin level 0.3-0.7 units/ml aPTT 66-102 seconds Monitor platelets by anticoagulation protocol: Yes   Plan:  Heparin gtt 1300 units/hr starting 1900 tonight Check an 8 hr heparin level and aPTT Daily heparin level, aPTT and CBC  Necha Harries, Rande Lawman 05/19/2019,3:50 PM

## 2019-05-19 NOTE — ED Notes (Signed)
Wife called for update and preliminary results given.  Also spoke with PA.

## 2019-05-19 NOTE — ED Notes (Signed)
Pain now a 4 after Nitro.  Pt restful with concerns about having to stay.  No other changes.

## 2019-05-19 NOTE — Progress Notes (Addendum)
Pharmacy Antibiotic Note  David Barton is a 85 y.o. male admitted on 05/19/2019 with pneumonia.  Pharmacy has been consulted for vancomycin and cefepime dosing. Pt is afebrile but WBC is elevated at 12.7. SCr is elevated but is consistent with past results.   Plan: Vancomycin 2gm IV x 1 then 1gm IV Q48H Cefepime 2gm IV Q24H F/u renal fxn, C&S, clinical status and peak/trough at SS  Height: 5\' 10"  (177.8 cm) Weight: 220 lb (99.8 kg) IBW/kg (Calculated) : 73  Temp (24hrs), Avg:98.4 F (36.9 C), Min:98.4 F (36.9 C), Max:98.4 F (36.9 C)  Recent Labs  Lab 05/19/19 0901  WBC 12.7*  CREATININE 2.86*    Estimated Creatinine Clearance: 23.2 mL/min (A) (by C-G formula based on SCr of 2.86 mg/dL (H)).    Allergies  Allergen Reactions  . Flexeril [Cyclobenzaprine] Other (See Comments)    Unknown reaction     Antimicrobials this admission: Vanc 5/30>> Cefepime 5/30>>  Dose adjustments this admission: N/A  Microbiology results: Pending  Thank you for allowing pharmacy to be a part of this patient's care.  Earline Stiner, Rande Lawman 05/19/2019 11:31 AM

## 2019-05-19 NOTE — ED Notes (Signed)
Pt wife [name] [phone]

## 2019-05-19 NOTE — ED Notes (Signed)
Patient having significant drop in sats to 80's.   Pt denies any changes in his breathing.  Appears more labored than previously.  Sat probes changed x 3 with same results.

## 2019-05-19 NOTE — ED Notes (Signed)
ED TO INPATIENT HANDOFF REPORT  ED Nurse Name and Phone #: Celene Squibb RN  S Name/Age/Gender David Barton 85 y.o. male Room/Bed: 022C/022C  Code Status   Code Status: DNR  Home/SNF/Other Home Patient oriented to: self, place, time and situation Is this baseline? Yes   Triage Complete: Triage complete  Chief Complaint Chest Pain  Triage Note Woke at 0530 with midsternal CP without other noted sx.  Denies hx pf heart dx.  No N/V/D, some noted SOB.   Alert and oriented.  No outward pain responses.     Allergies Allergies  Allergen Reactions  . Flexeril [Cyclobenzaprine] Other (See Comments)    Unknown reaction     Level of Care/Admitting Diagnosis ED Disposition    ED Disposition Condition Comment   Admit  [facility] [[id number]]  Level of Care: ICU [6]  Covid Evaluation: Confirmed COVID Negative  Diagnosis: Acute on chronic respiratory failure [facility]) [[id number]]  Admitting Physician: Margaretha Seeds [[id number]]  Attending Physician: Margaretha Seeds [phone]  Estimated length of stay: 3 - 4 days  Certification:: I certify this patient will need inpatient services for at least 2 midnights  PT Class (Do Not Modify): Inpatient [101]  PT Acc Code (Do Not Modify): Private [1]       B Medical/Surgery History Past Medical History:  Diagnosis Date  . Alcohol abuse   . Colon cancer ([place])   . COPD (chronic obstructive pulmonary disease) ([place])   . Emphysema of lung ([place])   . Former tobacco use   . Hypertension   . Peripheral edema   . Sepsis ([place]) 10/2016   Aspiration PNA/C diff colitis   Past Surgical History:  Procedure Laterality Date  . CARDIOVERSION N/A 03/16/2019   Procedure: CARDIOVERSION;  Surgeon: Pixie Casino, MD;  Location: [facility] ENDOSCOPY;  Service: Cardiovascular;  Laterality: N/A;  . CARDIOVERSION N/A 03/23/2019   Procedure: CARDIOVERSION;  Surgeon: Lelon Perla, MD;  Location: [facility] ENDOSCOPY;  Service:  Cardiovascular;  Laterality: N/A;  . CATARACT EXTRACTION    . COLON RESECTION    . COLONOSCOPY    . IMPLANTATION BONE ANCHORED HEARING AID Left 2016  . MINOR HEMORRHOIDECTOMY  1995  . TEE WITHOUT CARDIOVERSION N/A 03/16/2019   Procedure: TRANSESOPHAGEAL ECHOCARDIOGRAM (TEE);  Surgeon: Pixie Casino, MD;  Location: [place];  Service: Cardiovascular;  Laterality: N/A;  . TONSILLECTOMY       A IV Location/Drains/Wounds Patient Lines/Drains/Airways Status   Active Line/Drains/Airways    Name:   Placement date:   Placement time:   Site:   Days:   Peripheral IV 05/19/19 Left Antecubital   05/19/19    0845    Antecubital   less than 1   Pressure Injury 03/14/19 Stage II -  Partial thickness loss of dermis presenting as a shallow open ulcer with a red, pink wound bed without slough. 2cm area of open skin on left buttock, right buttock red, but blanches   03/14/19    0800     66          Intake/Output Last 24 hours  Intake/Output Summary (Last 24 hours) at 05/19/2019 1513 Last data filed at 05/19/2019 1448 Gross per 24 hour  Intake 1100 ml  Output -  Net 1100 ml    Labs/Imaging Results for orders placed or performed during the hospital encounter of 05/19/19 (from the past 48 hour(s))  Basic metabolic panel     Status: Abnormal   Collection Time:  05/19/19  9:01 AM  Result Value Ref Range   Sodium 140 135 - 145 mmol/L   Potassium 4.4 3.5 - 5.1 mmol/L   Chloride 90 (L) 98 - 111 mmol/L   CO2 34 (H) 22 - 32 mmol/L   Glucose, Bld 129 (H) 70 - 99 mg/dL   BUN 59 (H) 8 - 23 mg/dL   Creatinine, Ser 2.86 (H) 0.61 - 1.24 mg/dL   Calcium 9.9 8.9 - 10.3 mg/dL   GFR calc non Af Amer 19 (L) >60 mL/min   GFR calc Af Amer 23 (L) >60 mL/min   Anion gap 16 (H) 5 - 15    Comment: Performed at [facility] [address] [place], [place] [address]  Troponin I - Once     Status: None   Collection Time: 05/19/19  9:01 AM  Result Value Ref Range   Troponin I <0.03 <0.03 ng/mL     Comment: Performed at [facility] [address], [place], [place] [address]  CBC with Differential     Status: Abnormal   Collection Time: 05/19/19  9:01 AM  Result Value Ref Range   WBC 12.7 (H) 4.0 - 10.5 K/uL   RBC 3.13 (L) 4.22 - 5.81 MIL/uL   Hemoglobin 10.0 (L) 13.0 - 17.0 g/dL   HCT 34.1 (L) 39.0 - 52.0 %   MCV 108.9 (H) 80.0 - 100.0 fL   MCH 31.9 26.0 - 34.0 pg   MCHC 29.3 (L) 30.0 - 36.0 g/dL   RDW 16.1 (H) 11.5 - 15.5 %   Platelets 237 150 - 400 K/uL   nRBC 0.0 0.0 - 0.2 %   Neutrophils Relative % 73 %   Neutro Abs 9.2 (H) 1.7 - 7.7 K/uL   Lymphocytes Relative 10 %   Lymphs Abs 1.3 0.7 - 4.0 K/uL   Monocytes Relative 12 %   Monocytes Absolute 1.5 (H) 0.1 - 1.0 K/uL   Eosinophils Relative 5 %   Eosinophils Absolute 0.7 (H) 0.0 - 0.5 K/uL   Basophils Relative 0 %   Basophils Absolute 0.0 0.0 - 0.1 K/uL   Immature Granulocytes 0 %   Abs Immature Granulocytes 0.04 0.00 - 0.07 K/uL    Comment: Performed at [facility], [place] [address] [place], [place] [address]  Brain natriuretic peptide     Status: Abnormal   Collection Time: 05/19/19  9:01 AM  Result Value Ref Range   B Natriuretic Peptide 465.7 (H) 0.0 - 100.0 pg/mL    Comment: Performed at [facility] [address], [place], [place] [address]  SARS Coronavirus 2 (CEPHEID- Performed in [place] hospital lab), Hosp Order     Status: None   Collection Time: 05/19/19  9:35 AM  Result Value Ref Range   SARS Coronavirus 2 NEGATIVE NEGATIVE    Comment: (NOTE) If result is NEGATIVE SARS-CoV-2 target nucleic acids are NOT DETECTED. The SARS-CoV-2 RNA is generally detectable in upper and lower  respiratory specimens during the acute phase of infection. The lowest  concentration of SARS-CoV-2 viral copies this assay can detect is 250  copies / mL. A negative result does not preclude SARS-CoV-2 infection  and should not be used as the sole basis for treatment or other  patient management decisions.   A negative result may occur with  improper specimen collection / handling, submission of specimen other  than nasopharyngeal swab, presence of viral mutation(s) within the  areas targeted by this  assay, and inadequate number of viral copies  (<250 copies / mL). A negative result must be combined with clinical  observations, patient history, and epidemiological information. If result is POSITIVE SARS-CoV-2 target nucleic acids are DETECTED. The SARS-CoV-2 RNA is generally detectable in upper and lower  respiratory specimens dur ing the acute phase of infection.  Positive  results are indicative of active infection with SARS-CoV-2.  Clinical  correlation with patient history and other diagnostic information is  necessary to determine patient infection status.  Positive results do  not rule out bacterial infection or co-infection with other viruses. If result is PRESUMPTIVE POSTIVE SARS-CoV-2 nucleic acids MAY BE PRESENT.   A presumptive positive result was obtained on the submitted specimen  and confirmed on repeat testing.  While 2019 novel coronavirus  (SARS-CoV-2) nucleic acids may be present in the submitted sample  additional confirmatory testing may be necessary for epidemiological  and / or clinical management purposes  to differentiate between  SARS-CoV-2 and other Sarbecovirus currently known to infect humans.  If clinically indicated additional testing with an alternate test  methodology [phone]) is advised. The SARS-CoV-2 RNA is generally  detectable in upper and lower respiratory sp ecimens during the acute  phase of infection. The expected result is Negative. Fact Sheet for Patients:  StrictlyIdeas.no Fact Sheet for Healthcare Providers: BankingDealers.co.za This test is not yet approved or cleared by the [place] FDA and has been authorized for detection and/or diagnosis of SARS-CoV-2 by FDA under an Emergency Use  Authorization (EUA).  This EUA will remain in effect (meaning this test can be used) for the duration of the COVID-19 declaration under Section 564(b)(1) of the Act, 21 U.S.C. section 360bbb-3(b)(1), unless the authorization is terminated or revoked sooner. Performed at [facility], [place] [address] [place], [place] [address]   Lactic acid, plasma     Status: None   Collection Time: 05/19/19 11:40 AM  Result Value Ref Range   Lactic Acid, Venous 1.4 0.5 - 1.9 mmol/L    Comment: Performed at [facility] [address] [place], [place] [address]   Dg Chest Portable 1 View  Result Date: 05/19/2019 CLINICAL DATA:  Chest pain EXAM: PORTABLE CHEST 1 VIEW COMPARISON:  05/04/2019 FINDINGS: There is increasing opacity at the right lung base with shift of the mediastinum to the right most consistent with increasing collapse in the right lower lobe. Heterogeneous opacities throughout the right lung are stable. Increasing heterogeneous opacities at the left base. Right pleural effusion is suspected. IMPRESSION: Increasing collapse in the right lower lobe. Stable opacities throughout the right lung. Increasing pulmonary opacities at the left lung base. Small right pleural effusion is suspected. Electronically Signed   By: Marybelle Killings M.D.   On: 05/19/2019 10:13    Pending Labs Unresulted Labs (From admission, onward)    Start     Ordered   05/20/19 0500  CBC  Tomorrow morning,   R     05/19/19 1441   05/20/19 1245  Basic metabolic panel  Tomorrow morning,   R     05/19/19 1441   05/20/19 0500  Lactic acid, plasma  Tomorrow morning,   R     05/19/19 1441   05/19/19 1442  TSH  Once,   R     05/19/19 1441   05/19/19 1426  Sedimentation rate  Once,   R     05/19/19 1441   05/19/19 1424  Procalcitonin  Once,   R  05/19/19 1441   05/19/19 1424  Brain natriuretic peptide  Once,   R     05/19/19 1441   05/19/19 1424  Magnesium  Once,   R     05/19/19 1441   05/19/19 1424   Phosphorus  Once,   R     05/19/19 1441   05/19/19 1424  Urinalysis, Routine w reflex microscopic  Once,   R     05/19/19 1441   05/19/19 1424  Strep pneumoniae urinary antigen  (not at [facility])  Once,   R     05/19/19 1441   05/19/19 1424  Legionella Pneumophila Serogp 1 Ur Ag  Once,   R     05/19/19 1441   05/19/19 1116  Culture, blood (routine x 2)  BLOOD CULTURE X 2,   STAT     05/19/19 1116          Vitals/Pain Today's Vitals   05/19/19 1407 05/19/19 1415 05/19/19 1445 05/19/19 1500  BP:  (!) 116/91 (!) 112/58 119/73  Pulse: 82 80 75 88  Resp: (!) 28 (!) 31 19 (!) 26  Temp:      TempSrc:      SpO2: 96% 97% 98% 96%  Weight:      Height:      PainSc:        Isolation Precautions No active isolations  Medications Medications  nitroGLYCERIN (NITROSTAT) SL tablet 0.4 mg (0.4 mg Sublingual Given 05/19/19 0910)  vancomycin (VANCOCIN) IVPB 1000 mg/200 mL premix (has no administration in time range)  sodium chloride flush (NS) 0.9 % injection 3 mL (has no administration in time range)  sodium chloride flush (NS) 0.9 % injection 3 mL (has no administration in time range)  0.9 %  sodium chloride infusion (has no administration in time range)  acetaminophen (TYLENOL) tablet 650 mg (has no administration in time range)  ondansetron (ZOFRAN) injection 4 mg (has no administration in time range)  albuterol (PROVENTIL) (2.5 MG/3ML) 0.083% nebulizer solution 2.5 mg (has no administration in time range)  bisoprolol (ZEBETA) tablet 5 mg (has no administration in time range)  ferrous sulfate tablet 325 mg (has no administration in time range)  ceFEPIme (MAXIPIME) 2 g in sodium chloride 0.9 % 100 mL IVPB (has no administration in time range)  Rivaroxaban (XARELTO) tablet 15 mg (has no administration in time range)  furosemide (LASIX) injection 40 mg (has no administration in time range)  amiodarone (PACERONE) tablet 200 mg (has no administration in time range)  aspirin chewable tablet 324  mg (324 mg Oral Given 05/19/19 0909)  ceFEPIme (MAXIPIME) 2 g in sodium chloride 0.9 % 100 mL IVPB (0 g Intravenous Stopped 05/19/19 1317)  vancomycin (VANCOCIN) 2,000 mg in sodium chloride 0.9 % 500 mL IVPB (2,000 mg Intravenous New Bag/Given 05/19/19 1247)  sodium chloride 0.9 % bolus 1,000 mL (0 mLs Intravenous Stopped 05/19/19 1448)    Mobility walks High fall risk   Focused Assessments Cardiac Assessment Handoff:    Lab Results  Component Value Date   TROPONINI <0.03 05/19/2019   Lab Results  Component Value Date   DDIMER 1.04 (H) 05/08/2013   Does the Patient currently have chest pain? No     R Recommendations: See Admitting Provider Note  Report given to:   Additional Notes:

## 2019-05-20 ENCOUNTER — Inpatient Hospital Stay (HOSPITAL_COMMUNITY): Payer: Medicare Other

## 2019-05-20 LAB — LACTIC ACID, PLASMA: Lactic Acid, Venous: 1 mmol/L (ref 0.5–1.9)

## 2019-05-20 LAB — BASIC METABOLIC PANEL
Anion gap: 11 (ref 5–15)
BUN: 60 mg/dL — ABNORMAL HIGH (ref 8–23)
CO2: 32 mmol/L (ref 22–32)
Calcium: 8.9 mg/dL (ref 8.9–10.3)
Chloride: 98 mmol/L (ref 98–111)
Creatinine, Ser: 2.93 mg/dL — ABNORMAL HIGH (ref 0.61–1.24)
GFR calc Af Amer: 22 mL/min — ABNORMAL LOW (ref >60)
GFR calc non Af Amer: 19 mL/min — ABNORMAL LOW (ref >60)
Glucose, Bld: 114 mg/dL — ABNORMAL HIGH (ref 70–99)
Potassium: 4.8 mmol/L (ref 3.5–5.1)
Sodium: 141 mmol/L (ref 135–145)

## 2019-05-20 LAB — CBC
HCT: 31.1 % — ABNORMAL LOW (ref 39.0–52.0)
Hemoglobin: 9.2 g/dL — ABNORMAL LOW (ref 13.0–17.0)
MCH: 31.9 pg (ref 26.0–34.0)
MCHC: 29.6 g/dL — ABNORMAL LOW (ref 30.0–36.0)
MCV: 108 fL — ABNORMAL HIGH (ref 80.0–100.0)
Platelets: 204 10*3/uL (ref 150–400)
RBC: 2.88 MIL/uL — ABNORMAL LOW (ref 4.22–5.81)
RDW: 16.4 % — ABNORMAL HIGH (ref 11.5–15.5)
WBC: 16.9 10*3/uL — ABNORMAL HIGH (ref 4.0–10.5)
nRBC: 0 % (ref 0.0–0.2)

## 2019-05-20 LAB — GLUCOSE, CAPILLARY: Glucose-Capillary: 104 mg/dL — ABNORMAL HIGH (ref 70–99)

## 2019-05-20 LAB — APTT
aPTT: 155 seconds — ABNORMAL HIGH (ref 24–36)
aPTT: 83 seconds — ABNORMAL HIGH (ref 24–36)

## 2019-05-20 LAB — HEPARIN LEVEL (UNFRACTIONATED): Heparin Unfractionated: >2.2 IU/mL — ABNORMAL HIGH (ref 0.30–0.70)

## 2019-05-20 MED ORDER — FUROSEMIDE 10 MG/ML IJ SOLN
20.0000 mg | Freq: Every day | INTRAMUSCULAR | Status: DC
  Administered 2019-05-20: 20 mg via INTRAVENOUS
  Filled 2019-05-20: qty 2

## 2019-05-20 MED ORDER — MAGNESIUM SULFATE 2 GM/50ML IV SOLN
2.0000 g | Freq: Once | INTRAVENOUS | Status: AC
  Administered 2019-05-20: 2 g via INTRAVENOUS
  Filled 2019-05-20: qty 50

## 2019-05-20 NOTE — Progress Notes (Signed)
Patient is back from CT without any events. Patient is back on 6L [place] sat is 100%.

## 2019-05-20 NOTE — Progress Notes (Signed)
[place] for heparin Indication: atrial fibrillation  Allergies  Allergen Reactions  . Flexeril [Cyclobenzaprine] Other (See Comments)    Unknown reaction     Patient Measurements: Height: 5\' 10"  (177.8 cm) Weight: 220 lb (99.8 kg) IBW/kg (Calculated) : 73 Heparin Dosing Weight: 93.8kg  Vital Signs: Temp: 98.6 F (37 C) (05/31 0414) Temp Source: Oral (05/31 0414) BP: 128/74 (05/31 0500) Pulse Rate: 97 (05/31 0500)  Labs: Recent Labs    05/19/19 0901 05/19/19 1751 05/20/19 0301  HGB 10.0* 9.5* 9.2*  HCT 34.1* 28.0* 31.1*  PLT 237  --  204  APTT  --   --  155*  HEPARINUNFRC  --   --  >2.20*  CREATININE 2.86*  --  2.93*  TROPONINI <0.03  --   --     Estimated Creatinine Clearance: 22.6 mL/min (A) (by C-G formula based on SCr of 2.93 mg/dL (H)).   Medical History: Past Medical History:  Diagnosis Date  . Alcohol abuse   . Colon cancer ([place])   . COPD (chronic obstructive pulmonary disease) ([place])   . Emphysema of lung ([place])   . Former tobacco use   . Hypertension   . Peripheral edema   . Sepsis ([place]) 10/2016   Aspiration PNA/C diff colitis    Medications:  Infusions:  . sodium chloride    . ceFEPime (MAXIPIME) IV    . [START ON 05/24/2019] ferumoxytol    . heparin 1,300 Units/hr (05/20/19 0500)  . [START ON 05/21/2019] vancomycin      Assessment: 35 yom presented to the ED with CP. He is on chronic xarelto for history of afib. This will be transitioned to IV heparin. Baseline Hgb is low and platelets are WNL. No bleeding noted.  Initial aPTT 155 sec, HL >2.2  Goal of Therapy:  Heparin level 0.3-0.7 units/ml aPTT 66-102 seconds Monitor platelets by anticoagulation protocol: Yes   Plan:  Decrease heparin gtt 1100 units/hr Check heparin level and aPTT in 6-8 hours Daily heparin level, aPTT and CBC  [place] Bohac P[place]et 05/20/2019,5:38 AM

## 2019-05-20 NOTE — Progress Notes (Signed)
Patient transported to CT via transport services. MD gave order for patient to leave without RN. Patient put on Venti mask at 15L for transport.

## 2019-05-20 NOTE — Care Management (Signed)
Confirmed w Corene Cornea of [facility] that patient is active for nursing and PT.

## 2019-05-20 NOTE — Progress Notes (Signed)
NAME:  David Barton, MRN:  [id number], DOB:  [birth year], LOS: 1 ADMISSION DATE:  05/19/2019, CONSULTATION DATE:  5/30  REFERRING MD:  Lanny Cramp PA  CHIEF COMPLAINT:  Hypoxia   Brief History    85 year old male former smoker with underlying moderate COPD (fev1 > 60% in 2014) and oxygen dependent respiratory failure.  Patient lives at home with his wife.  He presents to the emergency room on May 19, 2019 with chest pain and shortness of breath , found to have hypoxemia and increased oxygen demands. Recent prolonged admission in March-April for  pneumonia and septic shock.  Complicated by new onset A. fib with RVR and worsening renal failure. Patient was recently seen for a pulmonary consult with Dr. Melvyn Novas in the outpatient setting.  Patient was seen for a post hospital follow-up for recent pneumonia.  Patient was having worsening shortness of breath , cough , blood tinged mucus and increased oxygen demands at 4 L.  Chest x-ray showed multifocal airspace disease on the right. Patient was treated for a pneumonia with Levaquin x7 days.. Follow-up chest x-ray on April 04, 2019 showed movement in the right upper lobe with residual right lower lobe disease and small right effusion.  And stable left lower lobe airspace disease. Patient did have an elevated sed rate , concern for possible amiodarone pulmonary toxicity was considered , however chest after antibiotics on 5/15 showed improvement.  Patient says over the last 2 weeks breathing has gotten worse in the morning of arrival to the ER he had chest pain and worsening shortness of breath. He has been afebrile.  Blood pressure was stable.  White blood cell count was mildly elevated at 12.7.  ED course: Chest x-ray with increased collapse of the right lower lobe diffuse opacities to the right lung.  And a right pleural effusion, lactic acid 1.4.  Troponin 0 0.03.  BNP is 465, creatinine 2.86, potassium 4.4.,  COVID-19 SARS NEG .     Past Medical History   A. fib on Xarelto, diastolic heart failure, cardiomyopathy, Chronic kidney disease, colon cancer history, EtOH abuse history    Significant Hospital Events   05/19/2019  - admit and O2 sats 70s-low 80s on 6l/m [place] . Alert and talking, following commands Results for [name], [name] (MRN [id number]) as of 05/20/2019 11:02  Ref. Range 05/04/2019 11:37 05/19/2019 16:32  Sed Rate Latest Ref Range: 0 - 16 mm/hr 75 (H) 84 (H)    Consults:    Procedures:    Significant Diagnostic Tests:    Micro Data:  5/30 BC x 2 >>   Antimicrobials:  5/30 Maxipime>> 5/30 Vanc >> 5/31  Interim history/subjective:   5/31 - refusing bipap. Wants to eat. Oriented x 4. Calm. On HFNC 15L [place] and refusing bipap. DNR/DNI. RN asking if ok to transfer. Making urine. Not on pressors. On IV heparin in lieu of home eliquis. No bedsores per RN.. . COVID negative. Urine strep negative. Baseline CKD - creat 2.5 - '3mg'$ % in 2020. DNR - and he confirmed against intubation. Reluctantly agreed to bipap QHS x 4h.   Home o2 3-4L . Was walking at home "Recently"  Objective   Blood pressure (!) 115/59, pulse 85, temperature 98 F (36.7 C), temperature source Oral, resp. rate (!) 24, height '5\' 10"'$  (1.778 m), weight 102.4 kg, SpO2 96 %.    Vent Mode: BIPAP FiO2 (%):  [50 %] 50 %   Intake/Output Summary (Last 24 hours) at 05/20/2019 1100 Last data filed  at 05/20/2019 0800 Gross per 24 hour  Intake 1781.67 ml  Output 500 ml  Net 1281.67 ml   Filed Weights   05/19/19 0849 05/20/19 0500  Weight: 99.8 kg 102.4 kg    General Appearance: obese, deconditioned looking, On O2 Head:  Normocephalic, without obvious abnormality, atraumatic Eyes:  PERRL - yes, conjunctiva/corneas - muddy     Ears:  Normal external ear canals, both ears Nose:  G tube - no but has HFNC + Throat:  ETT TUBE - no , OG tube - no Neck:  Supple,  No enlargement/tenderness/nodules Lungs: Clear to auscultation bilaterally byt distant and at bottom has  wheeze Heart:  S1 and S2 normal, no murmur, CVP - no.  Pressors - no Abdomen:  Soft, no masses, no organomegaly Genitalia / Rectal:  Not done Extremities:  Extremities- intact, edema + Skin:  ntact in exposed areas . Sacral area - not examined Neurologic:  Sedation - none -> RASS - +1 . Moves all 4s - yes. CAM-ICU - neg . Orientation - x3 +      LABS    PULMONARY Recent Labs  Lab 05/19/19 1751  PHART 7.342*  PCO2ART 63.7*  PO2ART 68.0*  HCO3 34.6*  TCO2 36*  O2SAT 91.0    CBC Recent Labs  Lab 05/19/19 0901 05/19/19 1751 05/20/19 0301  HGB 10.0* 9.5* 9.2*  HCT 34.1* 28.0* 31.1*  WBC 12.7*  --  16.9*  PLT 237  --  204    COAGULATION No results for input(s): INR in the last 168 hours.  CARDIAC   Recent Labs  Lab 05/19/19 0901  TROPONINI <0.03   No results for input(s): PROBNP in the last 168 hours.   CHEMISTRY Recent Labs  Lab 05/19/19 0901 05/19/19 1632 05/19/19 1751 05/20/19 0301  NA 140  --  140 141  K 4.4  --  4.7 4.8  CL 90*  --   --  98  CO2 34*  --   --  32  GLUCOSE 129*  --   --  114*  BUN 59*  --   --  60*  CREATININE 2.86*  --   --  2.93*  CALCIUM 9.9  --   --  8.9  MG  --  1.8  --   --   PHOS  --  4.1  --   --    Estimated Creatinine Clearance: 22.9 mL/min (A) (by C-G formula based on SCr of 2.93 mg/dL (H)).   LIVER No results for input(s): AST, ALT, ALKPHOS, BILITOT, PROT, ALBUMIN, INR in the last 168 hours.   INFECTIOUS Recent Labs  Lab 05/19/19 1140 05/19/19 1632 05/20/19 0301  LATICACIDVEN 1.4  --  1.0  PROCALCITON  --  2.52  --      ENDOCRINE CBG (last 3)  Recent Labs    05/19/19 1601 05/20/19 0847  GLUCAP 110* 104*         IMAGING x48h  - image(s) personally visualized  -   highlighted in bold Dg Chest Port 1 View  Result Date: 05/20/2019 CLINICAL DATA:  Pneumonia EXAM: PORTABLE CHEST 1 VIEW COMPARISON:  05/19/2019 FINDINGS: Small to moderate right pleural effusion, similar. Associated right lower  lobe opacity, likely atelectasis. Trace left pleural effusion. Left lung is otherwise clear. No pneumothorax is seen. Cardiomegaly. IMPRESSION: Small to moderate right pleural effusion and trace left pleural effusion, grossly unchanged. Associated right lower lobe opacity, likely atelectasis. Electronically Signed   By: Henderson Newcomer.D.  On: 05/20/2019 05:05   Dg Chest Portable 1 View  Result Date: 05/19/2019 CLINICAL DATA:  Chest pain EXAM: PORTABLE CHEST 1 VIEW COMPARISON:  05/04/2019 FINDINGS: There is increasing opacity at the right lung base with shift of the mediastinum to the right most consistent with increasing collapse in the right lower lobe. Heterogeneous opacities throughout the right lung are stable. Increasing heterogeneous opacities at the left base. Right pleural effusion is suspected. IMPRESSION: Increasing collapse in the right lower lobe. Stable opacities throughout the right lung. Increasing pulmonary opacities at the left lung base. Small right pleural effusion is suspected. Electronically Signed   By: Marybelle Killings M.D.   On: 05/19/2019 10:13    Resolved Hospital Problem list     Assessment & Plan:  Acute on chronic hypoxic respiratory failure Bilateral airspace disease right greater than left with suspected  H CAP +/- volume overload . Patient is on amiodarone need to monitor for pulmonary toxicity .  COPD  High pre test prob osa (desats with hx of snoring)  05/20/2019 - CHronic hypercapnia  +  severe hypoxemia needing 15L HFNC. - perimaging seems since march 2020 LL consolidation/atx worse - ? Cause - >? HCAP v BOOP (ESR 80) v aspiration v amio lung tox . Refusing BiPAP but finally agreed  Plan  BIPAP support  - qhs x 4h Daily incentive spirometry OOB to chair Prone positioning (refused) Get CT chest without contrasr and if large effusion do thora Track Procalcitonin Continue empiric abx with maxipime but dc  vanc (MRSA PCR neg) Duoneb Three times a day    Consider swallow evaluation (?aspiration)   Check AchRab 05/21/19  Will need pre discharge abg to determine home bipap     CKD -followed by renal OP setting  Scr slightly improved 3.07 >2.86   5/31 - creat within range  Plan  Strict I/O  Monitor and replace electrolytes  Avoid nephrotoxins (dc vanc)    A FIb  DCHF -echo 02/2019 EF 60-65% BNP elevated at 443  Amio toxicity low suspicion  Though ESR > 80+  Plan  Continue home cardizem, and bisoprolol   IV HEP in lieu of home oral anticoagu Continue Amiodarone  Lasix '2mg'$  IV daily1 (on home lasix '60mg'$  daily ) - might have to increase    Anemia  Hbg stable   Plan  - PRBC for hgb </= 6.9gm%    - exceptions are   -  if ACS susepcted/confirmed then transfuse for hgb </= 8.0gm%,  or    -  active bleeding with hemodynamic instability, then transfuse regardless of hemoglobin value   At at all times try to transfuse 1 unit prbc as possible with exception of active hemorrhage    Physical Deconditioning  - Mobilize   Best practice:  Diet:  Heart healthy diet Pain/Anxiety/Delirium protocol (if indicated): na  VAP protocol (if indicated): na DVT prophylaxis:  xarelto  GI prophylaxis: na  Glucose control: na Mobility: oob to chair Code Status: DNR but full medical care Family Communication: patient updated. Wife updated - she feels discharge in sprin 2020 to home was premature because of the covid outbreak. Came home too deconditioned Disposition: move to progressive unit . [place] primary 05/21/19 . Cccm will follow   05/19/19 > Discussion with patient regarding code status , previously DNR. Patient confirms he wants a DNR status.    SIGNATURE    Dr. Brand Males, M.D., F.C.C.P,  Pulmonary and Critical Care Medicine Staff Physician, [facility]  Director - Interstitial Lung Disease  Program  [facility] [place], [place], [address]  Pager: [phone], If no answer or between  15:00h - 7:00h: call [phone] Telephone: [phone]  11:00 AM 05/20/2019

## 2019-05-20 NOTE — Progress Notes (Signed)
ANTICOAGULATION CONSULT NOTE   Pharmacy Consult for heparin Indication: atrial fibrillation  Patient Measurements: Height: 5\' 10"  (177.8 cm) Weight: 225 lb 12 oz (102.4 kg) IBW/kg (Calculated) : 73 Heparin Dosing Weight: 93.8kg  Vital Signs: Temp: 98 F (36.7 C) (05/31 1535) Temp Source: Oral (05/31 1535) BP: 118/73 (05/31 1600) Pulse Rate: 95 (05/31 1600)  Labs: Recent Labs    05/19/19 0901 05/19/19 1751 05/20/19 0301 05/20/19 1555  HGB 10.0* 9.5* 9.2*  --   HCT 34.1* 28.0* 31.1*  --   PLT 237  --  204  --   APTT  --   --  155* 83*  HEPARINUNFRC  --   --  >2.20*  --   CREATININE 2.86*  --  2.93*  --   TROPONINI <0.03  --   --   --     Estimated Creatinine Clearance: 22.9 mL/min (A) (by C-G formula based on SCr of 2.93 mg/dL (H)).  Assessment: 71 yom presented to the ED with CP. He is on chronic xarelto for history of afib which was transitioned to IV heparin. Aptt is now therapeutic at 83. No bleeding noted.   Goal of Therapy:  Heparin level 0.3-0.7 units/ml aPTT 66-102 seconds Monitor platelets by anticoagulation protocol: Yes   Plan:  Continue heparin gtt 1100 units/hr Daily heparin level, aPTT and CBC  Yaeli Hartung, Rande Lawman 05/20/2019,4:46 PM

## 2019-05-21 ENCOUNTER — Inpatient Hospital Stay (HOSPITAL_COMMUNITY): Payer: Medicare Other

## 2019-05-21 ENCOUNTER — Encounter (HOSPITAL_COMMUNITY): Payer: Self-pay | Admitting: Student

## 2019-05-21 ENCOUNTER — Telehealth: Payer: Self-pay | Admitting: Internal Medicine

## 2019-05-21 DIAGNOSIS — I361 Nonrheumatic tricuspid (valve) insufficiency: Secondary | ICD-10-CM

## 2019-05-21 HISTORY — PX: IR THORACENTESIS ASP PLEURAL SPACE W/IMG GUIDE: IMG5380

## 2019-05-21 LAB — PROTEIN, PLEURAL OR PERITONEAL FLUID: Total protein, fluid: 3.2 g/dL

## 2019-05-21 LAB — BASIC METABOLIC PANEL
Anion gap: 11 (ref 5–15)
BUN: 75 mg/dL — ABNORMAL HIGH (ref 8–23)
CO2: 30 mmol/L (ref 22–32)
Calcium: 8.6 mg/dL — ABNORMAL LOW (ref 8.9–10.3)
Chloride: 96 mmol/L — ABNORMAL LOW (ref 98–111)
Creatinine, Ser: 3.7 mg/dL — ABNORMAL HIGH (ref 0.61–1.24)
GFR calc Af Amer: 17 mL/min — ABNORMAL LOW (ref >60)
GFR calc non Af Amer: 14 mL/min — ABNORMAL LOW (ref >60)
Glucose, Bld: 122 mg/dL — ABNORMAL HIGH (ref 70–99)
Potassium: 4.5 mmol/L (ref 3.5–5.1)
Sodium: 137 mmol/L (ref 135–145)

## 2019-05-21 LAB — BODY FLUID CELL COUNT WITH DIFFERENTIAL
Eos, Fluid: 0 %
Lymphs, Fluid: 12 %
Monocyte-Macrophage-Serous Fluid: 6 % — ABNORMAL LOW (ref 50–90)
Neutrophil Count, Fluid: 82 % — ABNORMAL HIGH (ref 0–25)
Total Nucleated Cell Count, Fluid: 1463 cu mm — ABNORMAL HIGH (ref 0–1000)

## 2019-05-21 LAB — HEPATIC FUNCTION PANEL
ALT: 10 U/L (ref 0–44)
AST: 14 U/L — ABNORMAL LOW (ref 15–41)
Albumin: 2.3 g/dL — ABNORMAL LOW (ref 3.5–5.0)
Alkaline Phosphatase: 50 U/L (ref 38–126)
Bilirubin, Direct: <0.1 mg/dL (ref 0.0–0.2)
Total Bilirubin: 0.5 mg/dL (ref 0.3–1.2)
Total Protein: 6 g/dL — ABNORMAL LOW (ref 6.5–8.1)

## 2019-05-21 LAB — LEGIONELLA PNEUMOPHILA SEROGP 1 UR AG: L. pneumophila Serogp 1 Ur Ag: NEGATIVE

## 2019-05-21 LAB — GRAM STAIN

## 2019-05-21 LAB — CBC
HCT: 27.6 % — ABNORMAL LOW (ref 39.0–52.0)
Hemoglobin: 8.3 g/dL — ABNORMAL LOW (ref 13.0–17.0)
MCH: 32.3 pg (ref 26.0–34.0)
MCHC: 30.1 g/dL (ref 30.0–36.0)
MCV: 107.4 fL — ABNORMAL HIGH (ref 80.0–100.0)
Platelets: 190 10*3/uL (ref 150–400)
RBC: 2.57 MIL/uL — ABNORMAL LOW (ref 4.22–5.81)
RDW: 16.4 % — ABNORMAL HIGH (ref 11.5–15.5)
WBC: 11.9 10*3/uL — ABNORMAL HIGH (ref 4.0–10.5)
nRBC: 0 % (ref 0.0–0.2)

## 2019-05-21 LAB — ECHOCARDIOGRAM COMPLETE
Height: 70 in
Weight: 3626.13 oz

## 2019-05-21 LAB — LACTATE DEHYDROGENASE, PLEURAL OR PERITONEAL FLUID: LD, Fluid: 140 U/L — ABNORMAL HIGH (ref 3–23)

## 2019-05-21 LAB — AMYLASE, PLEURAL OR PERITONEAL FLUID: Amylase, Fluid: 129 U/L

## 2019-05-21 LAB — ALBUMIN, PLEURAL OR PERITONEAL FLUID: Albumin, Fluid: 1.6 g/dL

## 2019-05-21 LAB — GLUCOSE, PLEURAL OR PERITONEAL FLUID: Glucose, Fluid: 136 mg/dL

## 2019-05-21 LAB — HEPARIN LEVEL (UNFRACTIONATED): Heparin Unfractionated: 1.66 IU/mL — ABNORMAL HIGH (ref 0.30–0.70)

## 2019-05-21 LAB — PHOSPHORUS: Phosphorus: 4.7 mg/dL — ABNORMAL HIGH (ref 2.5–4.6)

## 2019-05-21 LAB — MAGNESIUM: Magnesium: 2.3 mg/dL (ref 1.7–2.4)

## 2019-05-21 LAB — APTT: aPTT: 75 seconds — ABNORMAL HIGH (ref 24–36)

## 2019-05-21 LAB — PROCALCITONIN: Procalcitonin: 6.71 ng/mL

## 2019-05-21 LAB — TROPONIN I: Troponin I: <0.03 ng/mL (ref <0.03)

## 2019-05-21 MED ORDER — LIDOCAINE HCL 1 % IJ SOLN
INTRAMUSCULAR | Status: AC
  Filled 2019-05-21: qty 20

## 2019-05-21 MED ORDER — CHLORHEXIDINE GLUCONATE CLOTH 2 % EX PADS
6.0000 | MEDICATED_PAD | Freq: Every day | CUTANEOUS | Status: DC
  Administered 2019-05-25 – 2019-05-29 (×4): 6 via TOPICAL

## 2019-05-21 MED ORDER — ALLOPURINOL 100 MG PO TABS
100.0000 mg | ORAL_TABLET | Freq: Every day | ORAL | Status: DC
  Administered 2019-05-21 – 2019-05-30 (×10): 100 mg via ORAL
  Filled 2019-05-21 (×10): qty 1

## 2019-05-21 MED ORDER — LIDOCAINE HCL 1 % IJ SOLN
INTRAMUSCULAR | Status: DC | PRN
  Administered 2019-05-21: 10 mL

## 2019-05-21 NOTE — Progress Notes (Signed)
Patient wore the BiPAP from 2200-0230. Patient refused to wear the BiPAP anymore. Patient was educated on importance and benefits of wearing the BiPAP as long as possible. Patient stated that he couldn't stand wearing it any longer because of his "vertitigo" and continued to refuse the BiPAP. A Venturi Mask was placed on the patient. Will continue to monitor.

## 2019-05-21 NOTE — Progress Notes (Signed)
ANTICOAGULATION CONSULT NOTE   Pharmacy Consult for heparin Indication: atrial fibrillation  Patient Measurements: Height: 5\' 10"  (177.8 cm) Weight: 226 lb 10.1 oz (102.8 kg) IBW/kg (Calculated) : 73 Heparin Dosing Weight: 93.8kg  Vital Signs: Temp: 98.1 F (36.7 C) (06/01 0800) Temp Source: Axillary (06/01 0800) BP: 118/62 (06/01 0600) Pulse Rate: 84 (06/01 0600)  Labs: Recent Labs    05/19/19 0901 05/19/19 1751 05/20/19 0301 05/20/19 1555 05/21/19 0524  HGB 10.0* 9.5* 9.2*  --  8.3*  HCT 34.1* 28.0* 31.1*  --  27.6*  PLT 237  --  204  --  190  APTT  --   --  155* 83* 75*  HEPARINUNFRC  --   --  >2.20*  --  1.66*  CREATININE 2.86*  --  2.93*  --  3.70*  TROPONINI <0.03  --   --   --  <0.03    Estimated Creatinine Clearance: 18.2 mL/min (A) (by C-G formula based on SCr of 3.7 mg/dL (H)).  Assessment: 69 yom presented to the ED with CP. He is on chronic xarelto for history of afib which was transitioned to IV heparin.   Following aptt as hep lvl still affected by PTA xarelto  therapeutic at 75  Goal of Therapy:  Heparin level 0.3-0.7 units/ml aPTT 66-102 seconds Monitor platelets by anticoagulation protocol: Yes   Plan:  Continue heparin gtt 1100 units/hr Daily heparin level, aPTT and CBC  Levester Fresh, PharmD, BCPS, BCCCP Clinical Pharmacist [phone]  Please check AMION for all [place] numbers  05/21/2019 8:35 AM

## 2019-05-21 NOTE — Progress Notes (Signed)
NAME:  David Barton, MRN:  [id number], DOB:  04-22-1933, LOS: 2 ADMISSION DATE:  05/19/2019, CONSULTATION DATE:  5/30  REFERRING MD:  Lanny Cramp PA  CHIEF COMPLAINT:  Hypoxia   Brief History    85 year old male former smoker with underlying moderate COPD (fev1 > 60% in 2014) and oxygen dependent respiratory failure.  Patient lives at home with his wife.  He presents to the emergency room on May 19, 2019 with chest pain and shortness of breath , found to have hypoxemia and increased oxygen demands. Recent prolonged admission in March-April for  pneumonia and septic shock.  Complicated by new onset A. fib with RVR and worsening renal failure. Patient was recently seen for a pulmonary consult with Dr. Melvyn Novas in the outpatient setting.  Patient was seen for a post hospital follow-up for recent pneumonia.  Patient was having worsening shortness of breath , cough , blood tinged mucus and increased oxygen demands at 4 L.  Chest x-ray showed multifocal airspace disease on the right. Patient was treated for a pneumonia with Levaquin x7 days.. Follow-up chest x-ray on April 04, 2019 showed movement in the right upper lobe with residual right lower lobe disease and small right effusion.  And stable left lower lobe airspace disease. Patient did have an elevated sed rate , concern for possible amiodarone pulmonary toxicity was considered , however chest after antibiotics on 5/15 showed improvement.  Patient says over the last 2 weeks breathing has gotten worse in the morning of arrival to the ER he had chest pain and worsening shortness of breath. He has been afebrile.  Blood pressure was stable.  White blood cell count was mildly elevated at 12.7.  ED course: Chest x-ray with increased collapse of the right lower lobe diffuse opacities to the right lung.  And a right pleural effusion, lactic acid 1.4.  Troponin 0 0.03.  BNP is 465, creatinine 2.86, potassium 4.4.,  COVID-19 SARS NEG .     Past Medical History   A. fib on Xarelto, diastolic heart failure, cardiomyopathy, Chronic kidney disease, colon cancer history, EtOH abuse history    Significant Hospital Events   05/19/2019  - admit and O2 sats 70s-low 80s on 6l/m [place] . Alert and talking, following commands Results for GLYNN, YEPES (MRN [id number]) as of 05/20/2019 11:02  Ref. Range 05/04/2019 11:37 05/19/2019 16:32  Sed Rate Latest Ref Range: 0 - 16 mm/hr 75 (H) 84 (H)    5/31 -  refusing bipap. Wants to eat. Oriented x 4. Calm. On HFNC 15L [place] and refusing bipap. DNR/DNI. RN asking if ok to transfer. Making urine. Not on pressors. On IV heparin in lieu of home eliquis. No bedsores per RN.. . COVID negative. Urine strep negative. Baseline CKD - creat 2.5 - '3mg'$ % in 2020. DNR - and he confirmed against intubation. Reluctantly agreed to bipap QHS x 4h.  Home o2 3-4L [place]. Was walking at home "Recently"  Consults:    Procedures:    Significant Diagnostic Tests:    Micro Data:  5/30 BC x 2 >>   Antimicrobials:  5/30 Maxipime>> 5/30 Vanc >> 5/31  Interim history/subjective:   05/21/2019 - wore bipap 4h last night. Did not wear more due to vertigo > Rising PCT to 6.7 c/w sepsis but no fever. Worsening renal failure to creat 3.7. TSH normal. This am feels better. 8L [place] -> 100%, 6L [place] -> 97%. CT shows moderate rt pleural effusion with RLL Consolidation and new small pericardial effusion.  Objective   Blood pressure 109/61, pulse 90, temperature 98.1 F (36.7 C), temperature source Axillary, resp. rate (!) 21, height '5\' 10"'$  (1.778 m), weight 102.8 kg, SpO2 98 %.    FiO2 (%):  [40 %] 40 %   Intake/Output Summary (Last 24 hours) at 05/21/2019 1022 Last data filed at 05/21/2019 1000 Gross per 24 hour  Intake 1821.63 ml  Output 400 ml  Net 1421.63 ml   Filed Weights   05/19/19 0849 05/20/19 0500 05/21/19 0317  Weight: 99.8 kg 102.4 kg 102.8 kg   General Appearance:  Looks lot better. Sitting on chair. O2 on. Trying to read paper Head:   Normocephalic, without obvious abnormality, atraumatic Eyes:  PERRL - yes, conjunctiva/corneas - muddy     Ears:  Normal external ear canals, both ears Nose:  G tube - no. On [place] o2 Throat:  ETT TUBE - no , OG tube - no Neck:  Supple,  No enlargement/tenderness/nodules Lungs: Clear to auscultation bilaterally. But overall diminished Heart:  S1 and S2 normal, no murmur, CVP - no.  Pressors - no Abdomen:  Soft, no masses, no organomegaly Genitalia / Rectal:  Not done Extremities:  Extremities- intact Skin:  ntact in exposed areas . Sacral area - not examined Neurologic:  Sedation - none -> RASS - +1 . Moves all 4s - yes. CAM-ICU - neg . Orientation - x3+    LABS    PULMONARY Recent Labs  Lab 05/19/19 1751  PHART 7.342*  PCO2ART 63.7*  PO2ART 68.0*  HCO3 34.6*  TCO2 36*  O2SAT 91.0    CBC Recent Labs  Lab 05/19/19 0901 05/19/19 1751 05/20/19 0301 05/21/19 0524  HGB 10.0* 9.5* 9.2* 8.3*  HCT 34.1* 28.0* 31.1* 27.6*  WBC 12.7*  --  16.9* 11.9*  PLT 237  --  204 190    COAGULATION No results for input(s): INR in the last 168 hours.  CARDIAC   Recent Labs  Lab 05/19/19 0901 05/21/19 0524  TROPONINI <0.03 <0.03   No results for input(s): PROBNP in the last 168 hours.   CHEMISTRY Recent Labs  Lab 05/19/19 0901 05/19/19 1632 05/19/19 1751 05/20/19 0301 05/21/19 0524  NA 140  --  140 141 137  K 4.4  --  4.7 4.8 4.5  CL 90*  --   --  98 96*  CO2 34*  --   --  32 30  GLUCOSE 129*  --   --  114* 122*  BUN 59*  --   --  60* 75*  CREATININE 2.86*  --   --  2.93* 3.70*  CALCIUM 9.9  --   --  8.9 8.6*  MG  --  1.8  --   --  2.3  PHOS  --  4.1  --   --  4.7*   Estimated Creatinine Clearance: 18.2 mL/min (A) (by C-G formula based on SCr of 3.7 mg/dL (H)).   LIVER Recent Labs  Lab 05/21/19 0524  AST 14*  ALT 10  ALKPHOS 50  BILITOT 0.5  PROT 6.0*  ALBUMIN 2.3*     INFECTIOUS Recent Labs  Lab 05/19/19 1140 05/19/19 1632 05/20/19 0301 05/21/19  0524  LATICACIDVEN 1.4  --  1.0  --   PROCALCITON  --  2.52  --  6.71     ENDOCRINE CBG (last 3)  Recent Labs    05/19/19 1601 05/20/19 0847  GLUCAP 110* 104*         IMAGING x48h  -  image(s) personally visualized  -   highlighted in bold Ct Chest Wo Contrast  Result Date: 05/20/2019 CLINICAL DATA:  Unresolved pneumonia EXAM: CT CHEST WITHOUT CONTRAST TECHNIQUE: Multidetector CT imaging of the chest was performed following the standard protocol without IV contrast. COMPARISON:  03/19/2019 FINDINGS: Cardiovascular: Atherosclerotic calcifications of the aortic arch, great vessels, and coronary arteries are noted. No evidence of aortic aneurysm. Mediastinum/Nodes: No abnormal mediastinal adenopathy. Visualized thyroid is unremarkable. Small pericardial effusion is new. Lungs/Pleura: Left lower lobe consolidation and left pleural effusion are improved. Right lower lobe consolidation has markedly worsened. A moderate right pleural effusion has worsened. There is also patchy airspace disease throughout the right upper lobe. Subsegmental atelectasis in the right middle lobe. No pneumothorax. Centrilobular emphysema towards the lung apices. Upper Abdomen: No acute abnormality. Musculoskeletal: No vertebral compression deformity. IMPRESSION: Marked worsening consolidation in the right upper and lower lobes. Improved left lower lobe consolidation and left pleural effusion Worsening right pleural effusion which is moderate. New small pericardial effusion. Aortic Atherosclerosis (ICD10-I70.0) and Emphysema (ICD10-J43.9). Electronically Signed   By: Marybelle Killings M.D.   On: 05/20/2019 13:18   Dg Chest Port 1 View  Result Date: 05/20/2019 CLINICAL DATA:  Pneumonia EXAM: PORTABLE CHEST 1 VIEW COMPARISON:  05/19/2019 FINDINGS: Small to moderate right pleural effusion, similar. Associated right lower lobe opacity, likely atelectasis. Trace left pleural effusion. Left lung is otherwise clear. No  pneumothorax is seen. Cardiomegaly. IMPRESSION: Small to moderate right pleural effusion and trace left pleural effusion, grossly unchanged. Associated right lower lobe opacity, likely atelectasis. Electronically Signed   By: Julian Hy M.D.   On: 05/20/2019 05:05    Resolved Hospital Problem list     Assessment & Plan:  #baseline copd - gold stage 2 with 3-4L [place] at home # Baseline - undiagnosed High pre test prob osa (desats with hx of snoring) #baseline - chronic amio Rx  #Current - Acute on chronic hypoxic and hypercapnic respiratory failure - on ct 5/31 -  new Rt sidded consolidation with rt moderate effusion - with high PCT - culture negative - suspect rHCAP v Aspiration as cause. RN reports good swallow - but speech recommends MBS  6/1 - clinically better atefter 4h bipap last night  Plan  Goal pulse ox > 88% (not 100%) BIPAP support  - qhs x 4h - he cannot do more because of vertigo but has agreed to do 4h Daily incentive spirometry OOB to chair Rt thora at bedsideu by IR  - The fluid removal will be done by Interventional radiology   - they will remove not more than 1.5L  - fluid labs to be sent: cell count, gram stain and culture, cytology for malignant cells, chemistries for LDH, albumin,  Protein, glucose, triglyceride and lipase  - blood labs to be sent on same day / time: cbc, ldh, protein, albumin glucose, and lipase  - Risks of pneumothorax, hemothorax, sedation/anesthesia complications such as cardiac or respiratory arrest or hypotension, stroke and bleeding all explained. Benefits of diagnosis but limitations of non-diagnosis also explained. Patient verbalized understanding and wished to proceed.   Continue empiric abx with maxipime but dc  vanc (MRSA PCR neg) Duoneb Three times a day   Await AchRab 05/22/19  Will need pre discharge abg to determine home bipap     CKD -followed by renal OP setting  - Baseline CKD - creat 2.5 - '3mg'$ % in 2020  6/1 - sudden  rise in creat  Plan  Strict I/O  Monitor and replace electrolytes  Avoid nephrotoxins (dc vanc) Stop lasix Might need renal consult - triad to decide    A FIb  DCHF -echo 02/2019 EF 60-65% BNP elevated at 443   6/1 -Amio toxicity low suspicion  Though ESR > 80+  Plan  Continue home cardizem, and bisoprolol   IV HEPARIN in lieu of home oral anticoagu Continue Amiodarone  Hold lasix    Anemia  Hbg stable   Plan  - PRBC for hgb </= 6.9gm%    - exceptions are   -  if ACS susepcted/confirmed then transfuse for hgb </= 8.0gm%,  or    -  active bleeding with hemodynamic instability, then transfuse regardless of hemoglobin value   At at all times try to transfuse 1 unit prbc as possible with exception of active hemorrhage    Physical Deconditioning  - Mobilize   Best practice:  Diet:  Heart healthy diet Pain/Anxiety/Delirium protocol (if indicated): na  VAP protocol (if indicated): na DVT prophylaxis:  IV heparin for A Fib GI prophylaxis: na  Glucose control: na Mobility: oob to chair Code Status: DNR but full medical care  Family Communication: patient updated. Wife updated 5/31-  - she feels discharge in sprin 2020 to home was premature because of the covid outbreak. Came home too deconditioned and feel that is why he bounced back.  Updated Dr Wynelle Cleveland 05/21/2019 - she will update wife  Disposition: move to progressive unit . [place] primary 05/21/19 . CCM will follow   05/19/19 > Discussion with patient regarding code status , previously DNR. Patient confirms he wants a DNR status.    SIGNATURE    Dr. Brand Males, M.D., F.C.C.P,  Pulmonary and Critical Care Medicine Staff Physician, [facility] Director - Interstitial Lung Disease  Program  Pulmonary [facility] [place], [place], [address]  Pager: [phone], If no answer or between  15:00h - 7:00h: call [phone] Telephone: [phone]   10:22 AM 05/21/2019

## 2019-05-21 NOTE — Telephone Encounter (Signed)
Called and spoke with pt's wife Archie Patten regarding husband in the hospital Pt went to hospital Saturday 05/19/2019; pt states he felt pressure in chest and pain One Lung collapsed, and fluid had to be drained today. Pt is still in hospital. Pt's wife would like MW to be aware  MW please advise.

## 2019-05-21 NOTE — Progress Notes (Signed)
Modified Barium Swallow Progress Note  Patient Details  Name: David Barton MRN: [id number] Date of Birth: 07-26-1933  Today's Date: 05/21/2019  Modified Barium Swallow completed.  Full report located under Chart Review in the Imaging Section.  Brief recommendations include the following:  Clinical Impression  Patient presents with an oropharyngeal swallow that is WFL-WNL without aspiration or penetration even when taking large volume sips of thin liquids via cup and straw sips. He exhbited a trace amount of vallecular residuals, but these fully cleared with spontaneous swallowing.    Swallow Evaluation Recommendations       SLP Diet Recommendations: Regular solids;Thin liquid   Liquid Administration via: Cup;Straw   Medication Administration: Whole meds with liquid   Supervision: Patient able to self feed                    [name] 05/21/2019,1:49 PM    Sonia Baller, MA, [facility] Pager: [phone]

## 2019-05-21 NOTE — Procedures (Signed)
PROCEDURE SUMMARY:  Successful image-guided right thoracentesis. Yielded 1.85 liters of dark red fluid. Patient tolerated procedure well. No immediate complications. EBL = 5 mL.  Specimen was sent for labs. CXR ordered.  Geralda Baumgardner PA-C 05/21/2019 2:15 PM

## 2019-05-21 NOTE — Progress Notes (Signed)
  Echocardiogram 2D Echocardiogram has been performed.  Bobbye Charleston 05/21/2019, 12:20 PM

## 2019-05-21 NOTE — Progress Notes (Signed)
PROGRESS NOTE    David Barton   TMH:[id number]  DOB: 12/12/1933  DOA: 05/19/2019 PCP: Maury Dus, MD   Brief Narrative:  David Barton is an 85 y/o male with COPD,chronic resp failure on 4 L O2 at home,   systolic CHF with EF of 92% in 2017 but now normal,  A-fib/PSVT with h/o TEE cardioversion, HTN, CKD 4, ETOH abuse, colon cancer, who comes in for mid sternal chest pain radiating to the neck.  In the ED he was noted to become progressively hypoxic on his home O2 and was placed on BiPAP.  CXR > RLL collapse with mediastinal shift to the right, stable opacities in right lung, increasing opacities in LL base.  Procalcitonin 2.52, EBC 12, afebrile, BNP 465 Started on Vanc and Cefepime and admitted by [facility] to ICU. Elilquis changed to Heparin infusion.    CT on 5/31 >  Marked worsening consolidation in the right upper and lower lobes. Improved left lower lobe consolidation and left pleural effusion Worsening right pleural effusion which is moderate.  Last admitted from 03/03/19 through 03/26/19 for CAP, (fever rigors, strep pneumo on resp culture), septic shock, acute encephalopathy, acute CHF, mucous plugging with LLL collapse, New onset A-fib.    Subjective: He has dyspnea when he moves around. Not much cough. No other complaints.     Assessment & Plan:   Principal Problem:   Acute on chronic hypoxemic respiratory failure /  HCAP  - on Cefepime - Procalcitonin rising - CT shows worsening R consolidation and increasing R pleural effusion - awaiting pulm recommendations - Obtain SLP eval for ? aspiration - Acetylcholine receptor antibody ordered by PCCM and pending  Active Problems:   Atrial fibrillation with RVR   - TEE cardioversion on 03/16/19 - Xarelto changed to Heparin by PCCM for possible procedure - cont Amiodarone, Zebeta - Cardizem on hold  AKI on CKD 4 - baseline Cr ~ 2.5 - CR now 3.70- d/c Lasix today  Elevated TSH - possibly sick euthyroid- hold off  on treating  Gout - resume allopurinol    Time spent in minutes: 45 min -  time taken in reviewing prior notes DVT prophylaxis:  Heparin infusion Code Status: DNR Family Communication:  Disposition Plan: per pulmonary Consultants:   PCCM Procedures:     Antimicrobials:  Anti-infectives (From admission, onward)   Start     Dose/Rate Route Frequency Ordered Stop   05/21/19 1200  vancomycin (VANCOCIN) IVPB 1000 mg/200 mL premix  Status:  Discontinued     1,000 mg 200 mL/hr over 60 Minutes Intravenous Every 48 hours 05/19/19 1130 05/20/19 1133   05/20/19 1200  ceFEPIme (MAXIPIME) 2 g in sodium chloride 0.9 % 100 mL IVPB     2 g 200 mL/hr over 30 Minutes Intravenous Every 24 hours 05/19/19 1444     05/19/19 1130  ceFEPIme (MAXIPIME) 1 g in sodium chloride 0.9 % 100 mL IVPB  Status:  Discontinued     1 g 200 mL/hr over 30 Minutes Intravenous  Once 05/19/19 1116 05/19/19 1123   05/19/19 1130  ceFEPIme (MAXIPIME) 2 g in sodium chloride 0.9 % 100 mL IVPB     2 g 200 mL/hr over 30 Minutes Intravenous  Once 05/19/19 1123 05/19/19 1317   05/19/19 1130  vancomycin (VANCOCIN) 2,000 mg in sodium chloride 0.9 % 500 mL IVPB     2,000 mg 250 mL/hr over 120 Minutes Intravenous  Once 05/19/19 1123 05/19/19 1638  Objective: Vitals:   05/21/19 0317 05/21/19 0400 05/21/19 0500 05/21/19 0600  BP:  (!) 99/57 94/65 118/62  Pulse: 75 79 74 84  Resp: (!) 22 17 16  (!) 23  Temp: 97.8 F (36.6 C)     TempSrc: Axillary     SpO2: 95% 95% 97% 94%  Weight: 102.8 kg     Height:        Intake/Output Summary (Last 24 hours) at 05/21/2019 0726 Last data filed at 05/21/2019 0600 Gross per 24 hour  Intake 1388.65 ml  Output 400 ml  Net 988.65 ml   Filed Weights   05/19/19 0849 05/20/19 0500 05/21/19 0317  Weight: 99.8 kg 102.4 kg 102.8 kg    Examination: General exam: Appears comfortable  HEENT: PERRLA, oral mucosa moist, no sclera icterus or thrush Respiratory system: Clear to  auscultation. Respiratory effort normal. On 8 L O2- pulse ox 91% Cardiovascular system: S1 & S2 heard, IIRR.   Gastrointestinal system: Abdomen soft, non-tender, nondistended. Normal bowel sounds. Central nervous system: Alert and oriented. No focal neurological deficits. Extremities: No cyanosis, clubbing or edema Skin: No rashes or ulcers Psychiatry:  Mood & affect appropriate.     Data Reviewed: I have personally reviewed following labs and imaging studies  CBC: Recent Labs  Lab 05/17/19 0940 05/19/19 0901 05/19/19 1751 05/20/19 0301 05/21/19 0524  WBC  --  12.7*  --  16.9* 11.9*  NEUTROABS  --  9.2*  --   --   --   HGB 9.0* 10.0* 9.5* 9.2* 8.3*  HCT  --  34.1* 28.0* 31.1* 27.6*  MCV  --  108.9*  --  108.0* 107.4*  PLT  --  237  --  204 621   Basic Metabolic Panel: Recent Labs  Lab 05/19/19 0901 05/19/19 1632 05/19/19 1751 05/20/19 0301 05/21/19 0524  NA 140  --  140 141 137  K 4.4  --  4.7 4.8 4.5  CL 90*  --   --  98 96*  CO2 34*  --   --  32 30  GLUCOSE 129*  --   --  114* 122*  BUN 59*  --   --  60* 75*  CREATININE 2.86*  --   --  2.93* 3.70*  CALCIUM 9.9  --   --  8.9 8.6*  MG  --  1.8  --   --  2.3  PHOS  --  4.1  --   --  4.7*   GFR: Estimated Creatinine Clearance: 18.2 mL/min (A) (by C-G formula based on SCr of 3.7 mg/dL (H)). Liver Function Tests: Recent Labs  Lab 05/21/19 0524  AST 14*  ALT 10  ALKPHOS 50  BILITOT 0.5  PROT 6.0*  ALBUMIN 2.3*   No results for input(s): LIPASE, AMYLASE in the last 168 hours. No results for input(s): AMMONIA in the last 168 hours. Coagulation Profile: No results for input(s): INR, PROTIME in the last 168 hours. Cardiac Enzymes: Recent Labs  Lab 05/19/19 0901 05/21/19 0524  TROPONINI <0.03 <0.03   BNP (last 3 results) Recent Labs    05/04/19 1137  PROBNP 443.0*   HbA1C: No results for input(s): HGBA1C in the last 72 hours. CBG: Recent Labs  Lab 05/19/19 1601 05/20/19 0847  GLUCAP 110* 104*    Lipid Profile: No results for input(s): CHOL, HDL, LDLCALC, TRIG, CHOLHDL, LDLDIRECT in the last 72 hours. Thyroid Function Tests: Recent Labs    05/19/19 1632  TSH 4.099   Anemia Panel: No  results for input(s): VITAMINB12, FOLATE, FERRITIN, TIBC, IRON, RETICCTPCT in the last 72 hours. Urine analysis:    Component Value Date/Time   COLORURINE YELLOW 05/19/2019 [place] 05/19/2019 1746   LABSPEC 1.015 05/19/2019 1746   PHURINE 5.0 05/19/2019 1746   GLUCOSEU NEGATIVE 05/19/2019 1746   [place] 05/19/2019 [place] 05/19/2019 [place] 05/19/2019 1746   PROTEINUR 100 (A) 05/19/2019 1746   UROBILINOGEN 1.0 05/08/2013 1738   NITRITE NEGATIVE 05/19/2019 1746   LEUKOCYTESUR TRACE (A) 05/19/2019 1746   Sepsis Labs: @LABRCNTIP (procalcitonin:4,lacticidven:4) ) Recent Results (from the past 240 hour(s))  SARS Coronavirus 2 (CEPHEID- Performed in [place] hospital lab), Hosp Order     Status: None   Collection Time: 05/19/19  9:35 AM  Result Value Ref Range Status   SARS Coronavirus 2 NEGATIVE NEGATIVE Final    Comment: (NOTE) If result is NEGATIVE SARS-CoV-2 target nucleic acids are NOT DETECTED. The SARS-CoV-2 RNA is generally detectable in upper and lower  respiratory specimens during the acute phase of infection. The lowest  concentration of SARS-CoV-2 viral copies this assay can detect is 250  copies / mL. A negative result does not preclude SARS-CoV-2 infection  and should not be used as the sole basis for treatment or other  patient management decisions.  A negative result may occur with  improper specimen collection / handling, submission of specimen other  than nasopharyngeal swab, presence of viral mutation(s) within the  areas targeted by this assay, and inadequate number of viral copies  (<250 copies / mL). A negative result must be combined with clinical  observations, patient history, and epidemiological  information. If result is POSITIVE SARS-CoV-2 target nucleic acids are DETECTED. The SARS-CoV-2 RNA is generally detectable in upper and lower  respiratory specimens dur ing the acute phase of infection.  Positive  results are indicative of active infection with SARS-CoV-2.  Clinical  correlation with patient history and other diagnostic information is  necessary to determine patient infection status.  Positive results do  not rule out bacterial infection or co-infection with other viruses. If result is PRESUMPTIVE POSTIVE SARS-CoV-2 nucleic acids MAY BE PRESENT.   A presumptive positive result was obtained on the submitted specimen  and confirmed on repeat testing.  While 2019 novel coronavirus  (SARS-CoV-2) nucleic acids may be present in the submitted sample  additional confirmatory testing may be necessary for epidemiological  and / or clinical management purposes  to differentiate between  SARS-CoV-2 and other Sarbecovirus currently known to infect humans.  If clinically indicated additional testing with an alternate test  methodology [phone]) is advised. The SARS-CoV-2 RNA is generally  detectable in upper and lower respiratory sp ecimens during the acute  phase of infection. The expected result is Negative. Fact Sheet for Patients:  StrictlyIdeas.no Fact Sheet for Healthcare Providers: BankingDealers.co.za This test is not yet approved or cleared by the [place] FDA and has been authorized for detection and/or diagnosis of SARS-CoV-2 by FDA under an Emergency Use Authorization (EUA).  This EUA will remain in effect (meaning this test can be used) for the duration of the COVID-19 declaration under Section 564(b)(1) of the Act, 21 U.S.C. section 360bbb-3(b)(1), unless the authorization is terminated or revoked sooner. Performed at [facility], [place] [address], [place], [place] [address]   Culture, blood (routine x 2)      Status: None (Preliminary result)   Collection Time: 05/19/19 11:38 AM  Result Value  Ref Range Status   Specimen Description BLOOD LEFT ANTECUBITAL  Final   Special Requests   Final    BOTTLES DRAWN AEROBIC AND ANAEROBIC Blood Culture adequate volume   Culture   Final    NO GROWTH 2 DAYS Performed at [facility], [address], [place], [place] [address]    Report Status PENDING  Incomplete  Culture, blood (routine x 2)     Status: None (Preliminary result)   Collection Time: 05/19/19 11:40 AM  Result Value Ref Range Status   Specimen Description BLOOD RIGHT ANTECUBITAL  Final   Special Requests   Final    BOTTLES DRAWN AEROBIC AND ANAEROBIC Blood Culture results may not be optimal due to an inadequate volume of blood received in culture bottles   Culture   Final    NO GROWTH 2 DAYS Performed at [facility], [place] [address], [place], [place] [address]    Report Status PENDING  Incomplete  MRSA PCR Screening     Status: None   Collection Time: 05/19/19  4:20 PM  Result Value Ref Range Status   MRSA by PCR NEGATIVE NEGATIVE Final    Comment:        The GeneXpert MRSA Assay (FDA approved for NASAL specimens only), is one component of a comprehensive MRSA colonization surveillance program. It is not intended to diagnose MRSA infection nor to guide or monitor treatment for MRSA infections. Performed at [facility], [place] [address], [place], [place] [address]          Radiology Studies: Ct Chest Wo Contrast  Result Date: 05/20/2019 CLINICAL DATA:  Unresolved pneumonia EXAM: CT CHEST WITHOUT CONTRAST TECHNIQUE: Multidetector CT imaging of the chest was performed following the standard protocol without IV contrast. COMPARISON:  03/19/2019 FINDINGS: Cardiovascular: Atherosclerotic calcifications of the aortic arch, great vessels, and coronary arteries are noted. No evidence of aortic aneurysm. Mediastinum/Nodes: No abnormal mediastinal adenopathy.  Visualized thyroid is unremarkable. Small pericardial effusion is new. Lungs/Pleura: Left lower lobe consolidation and left pleural effusion are improved. Right lower lobe consolidation has markedly worsened. A moderate right pleural effusion has worsened. There is also patchy airspace disease throughout the right upper lobe. Subsegmental atelectasis in the right middle lobe. No pneumothorax. Centrilobular emphysema towards the lung apices. Upper Abdomen: No acute abnormality. Musculoskeletal: No vertebral compression deformity. IMPRESSION: Marked worsening consolidation in the right upper and lower lobes. Improved left lower lobe consolidation and left pleural effusion Worsening right pleural effusion which is moderate. New small pericardial effusion. Aortic Atherosclerosis (ICD10-I70.0) and Emphysema (ICD10-J43.9). Electronically Signed   By: Marybelle Killings M.D.   On: 05/20/2019 13:18   Dg Chest Port 1 View  Result Date: 05/20/2019 CLINICAL DATA:  Pneumonia EXAM: PORTABLE CHEST 1 VIEW COMPARISON:  05/19/2019 FINDINGS: Small to moderate right pleural effusion, similar. Associated right lower lobe opacity, likely atelectasis. Trace left pleural effusion. Left lung is otherwise clear. No pneumothorax is seen. Cardiomegaly. IMPRESSION: Small to moderate right pleural effusion and trace left pleural effusion, grossly unchanged. Associated right lower lobe opacity, likely atelectasis. Electronically Signed   By: Julian Hy M.D.   On: 05/20/2019 05:05   Dg Chest Portable 1 View  Result Date: 05/19/2019 CLINICAL DATA:  Chest pain EXAM: PORTABLE CHEST 1 VIEW COMPARISON:  05/04/2019 FINDINGS: There is increasing opacity at the right lung base with shift of the mediastinum to the right most consistent with increasing collapse in the right lower lobe. Heterogeneous opacities throughout the right  lung are stable. Increasing heterogeneous opacities at the left base. Right pleural effusion is suspected. IMPRESSION:  Increasing collapse in the right lower lobe. Stable opacities throughout the right lung. Increasing pulmonary opacities at the left lung base. Small right pleural effusion is suspected. Electronically Signed   By: Marybelle Killings M.D.   On: 05/19/2019 10:13      Scheduled Meds: . amiodarone  200 mg Oral Daily  . bisoprolol  5 mg Oral Daily  . ferrous sulfate  325 mg Oral Daily  . sodium chloride flush  3 mL Intravenous Q12H   Continuous Infusions: . sodium chloride    . ceFEPime (MAXIPIME) IV Stopped (05/20/19 1216)  . [START ON 05/24/2019] ferumoxytol    . heparin 1,100 Units/hr (05/20/19 1130)     LOS: 2 days      Debbe Odea, MD [facility] Pager: [email or web address] Password [facility] 05/21/2019, 7:26 AM

## 2019-05-21 NOTE — Evaluation (Signed)
Clinical/Bedside Swallow Evaluation Patient Details  Name: David Barton MRN: [id number] Date of Birth: 11-04-1933  Today's Date: 05/21/2019 Time: SLP Start Time (ACUTE ONLY): 3 SLP Stop Time (ACUTE ONLY): 1015 SLP Time Calculation (min) (ACUTE ONLY): 10 min  Past Medical History:  Past Medical History:  Diagnosis Date  . Alcohol abuse   . Colon cancer ([place])   . COPD (chronic obstructive pulmonary disease) ([place])   . Emphysema of lung ([place])   . Former tobacco use   . Hypertension   . Peripheral edema   . Sepsis ([place]) 10/2016   Aspiration PNA/C diff colitis   Past Surgical History:  Past Surgical History:  Procedure Laterality Date  . CARDIOVERSION N/A 03/16/2019   Procedure: CARDIOVERSION;  Surgeon: Pixie Casino, MD;  Location: [facility] ENDOSCOPY;  Service: Cardiovascular;  Laterality: N/A;  . CARDIOVERSION N/A 03/23/2019   Procedure: CARDIOVERSION;  Surgeon: Lelon Perla, MD;  Location: [facility] ENDOSCOPY;  Service: Cardiovascular;  Laterality: N/A;  . CATARACT EXTRACTION    . COLON RESECTION    . COLONOSCOPY    . IMPLANTATION BONE ANCHORED HEARING AID Left 2016  . MINOR HEMORRHOIDECTOMY  1995  . TEE WITHOUT CARDIOVERSION N/A 03/16/2019   Procedure: TRANSESOPHAGEAL ECHOCARDIOGRAM (TEE);  Surgeon: Pixie Casino, MD;  Location: [facility] ENDOSCOPY;  Service: Cardiovascular;  Laterality: N/A;  . TONSILLECTOMY     HPI:  Patient is an 85 y.o. male with PMH: COPD: oxygen dependent on 4L at home, atrial fibrillation on Xarelto, diastolic heart failure, cardiomyopathy, chronic kidney disease, colon cancer, ETOH abuse, who also had a prolonged hospitalization from March to April for septic shock. He arrived at the hospital on 5/30 with mid-sternal chest pain radiating to the neck and 2 week h/o worsening breathing. CXR revealed RLL collapse, mediastinal shift to the right, stable opacities in right lung, increasing opacities in LL base.  Repeat CXR revealed small to moderate right pleural  effusion and trace left pleural effusion.    Assessment / Plan / Recommendation Clinical Impression  Patient's oropharyngeal swallow are both WFL-WNL as per this bedside swallow evaluation with good laryngeal elevation and pharyngeal contraction and no overt s/s of aspiration or penetration. MD wishes to r/o silent aspiration and so an objective swallow study will be performed via MBS.  SLP Visit Diagnosis: Dysphagia, unspecified (R13.10)    Aspiration Risk  Mild aspiration risk    Diet Recommendation Thin liquid;Regular   Liquid Administration via: Cup;Straw Medication Administration: Whole meds with liquid Supervision: Patient able to self feed    Other  Recommendations     Follow up Recommendations Other (comment)(pending MBS results)      Frequency and Duration   N/A         Prognosis Prognosis for Safe Diet Advancement: Good      Swallow Study   General Date of Onset: 05/19/19 HPI: Patient is an 85 y.o. male with PMH: COPD: oxygen dependent on 4L at home, atrial fibrillation on Xarelto, diastolic heart failure, cardiomyopathy, chronic kidney disease, colon cancer, ETOH abuse, who also had a prolonged hospitalization from March to April for septic shock. He arrived at the hospital on 5/30 with mid-sternal chest pain radiating to the neck and 2 week h/o worsening breathing. CXR revealed RLL collapse, mediastinal shift to the right, stable opacities in right lung, increasing opacities in LL base.  Repeat CXR revealed small to moderate right pleural effusion and trace left pleural effusion.  Type of Study: Bedside Swallow Evaluation Previous Swallow Assessment: N/A  Diet Prior to this Study: Regular;Thin liquids Temperature Spikes Noted: No Respiratory Status: Nasal cannula History of Recent Intubation: No Behavior/Cognition: Alert;Cooperative;Pleasant mood Oral Cavity Assessment: Within Functional Limits Oral Care Completed by SLP: No Oral Cavity - Dentition: Adequate  natural dentition Vision: Functional for self-feeding Self-Feeding Abilities: Able to feed self Patient Positioning: Upright in chair Baseline Vocal Quality: Normal Volitional Cough: Strong Volitional Swallow: Able to elicit    Oral/Motor/Sensory Function Overall Oral Motor/Sensory Function: Within functional limits   Ice Chips     Thin Liquid Thin Liquid: Within functional limits Presentation: Self Fed;Straw Other Comments: No overt s/s of aspiration or penetration observed    Nectar Thick Nectar Thick Liquid: Not tested   Honey Thick Honey Thick Liquid: Not tested   Puree Puree: Not tested   Solid     Solid: Not tested      [name] 05/21/2019,1:32 PM   Sonia Baller, MA, CCC-SLP Speech Therapy [facility] Acute Rehab Pager: [phone]

## 2019-05-22 LAB — CBC
HCT: 27.6 % — ABNORMAL LOW (ref 39.0–52.0)
Hemoglobin: 8.1 g/dL — ABNORMAL LOW (ref 13.0–17.0)
MCH: 31.5 pg (ref 26.0–34.0)
MCHC: 29.3 g/dL — ABNORMAL LOW (ref 30.0–36.0)
MCV: 107.4 fL — ABNORMAL HIGH (ref 80.0–100.0)
Platelets: 198 10*3/uL (ref 150–400)
RBC: 2.57 MIL/uL — ABNORMAL LOW (ref 4.22–5.81)
RDW: 16.4 % — ABNORMAL HIGH (ref 11.5–15.5)
WBC: 9.7 10*3/uL (ref 4.0–10.5)
nRBC: 0 % (ref 0.0–0.2)

## 2019-05-22 LAB — MAGNESIUM: Magnesium: 2.3 mg/dL (ref 1.7–2.4)

## 2019-05-22 LAB — PHOSPHORUS: Phosphorus: 4.4 mg/dL (ref 2.5–4.6)

## 2019-05-22 LAB — BASIC METABOLIC PANEL
Anion gap: 12 (ref 5–15)
BUN: 78 mg/dL — ABNORMAL HIGH (ref 8–23)
CO2: 30 mmol/L (ref 22–32)
Calcium: 8.7 mg/dL — ABNORMAL LOW (ref 8.9–10.3)
Chloride: 98 mmol/L (ref 98–111)
Creatinine, Ser: 3.71 mg/dL — ABNORMAL HIGH (ref 0.61–1.24)
GFR calc Af Amer: 16 mL/min — ABNORMAL LOW (ref >60)
GFR calc non Af Amer: 14 mL/min — ABNORMAL LOW (ref >60)
Glucose, Bld: 111 mg/dL — ABNORMAL HIGH (ref 70–99)
Potassium: 4.5 mmol/L (ref 3.5–5.1)
Sodium: 140 mmol/L (ref 135–145)

## 2019-05-22 LAB — ACID FAST SMEAR (AFB, MYCOBACTERIA): Acid Fast Smear: NEGATIVE

## 2019-05-22 LAB — HEPARIN LEVEL (UNFRACTIONATED): Heparin Unfractionated: 1.03 IU/mL — ABNORMAL HIGH (ref 0.30–0.70)

## 2019-05-22 LAB — PATHOLOGIST SMEAR REVIEW

## 2019-05-22 LAB — APTT
aPTT: 61 seconds — ABNORMAL HIGH (ref 24–36)
aPTT: 71 seconds — ABNORMAL HIGH (ref 24–36)

## 2019-05-22 LAB — PROCALCITONIN: Procalcitonin: 5.44 ng/mL

## 2019-05-22 MED ORDER — IPRATROPIUM-ALBUTEROL 0.5-2.5 (3) MG/3ML IN SOLN
3.0000 mL | Freq: Four times a day (QID) | RESPIRATORY_TRACT | Status: DC | PRN
  Administered 2019-05-25 – 2019-05-30 (×3): 3 mL via RESPIRATORY_TRACT
  Filled 2019-05-22 (×2): qty 3

## 2019-05-22 MED ORDER — BISACODYL 10 MG RE SUPP: 10.0000 mg | Freq: Every day | RECTAL | Status: DC

## 2019-05-22 MED ORDER — IPRATROPIUM-ALBUTEROL 0.5-2.5 (3) MG/3ML IN SOLN
3.0000 mL | Freq: Four times a day (QID) | RESPIRATORY_TRACT | Status: DC
  Administered 2019-05-22 (×2): 3 mL via RESPIRATORY_TRACT
  Filled 2019-05-22 (×3): qty 3

## 2019-05-22 MED ORDER — BISACODYL 5 MG PO TBEC
10.0000 mg | DELAYED_RELEASE_TABLET | Freq: Every day | ORAL | Status: DC
  Administered 2019-05-22 – 2019-05-26 (×4): 10 mg via ORAL
  Filled 2019-05-22 (×8): qty 2

## 2019-05-22 MED ORDER — BUDESONIDE 0.25 MG/2ML IN SUSP
0.2500 mg | Freq: Two times a day (BID) | RESPIRATORY_TRACT | Status: DC
  Administered 2019-05-22 – 2019-05-30 (×17): 0.25 mg via RESPIRATORY_TRACT
  Filled 2019-05-22 (×18): qty 2

## 2019-05-22 NOTE — Evaluation (Signed)
Physical Therapy Evaluation Patient Details Name: GREGROY DOMBKOWSKI MRN: [id number] DOB: 1933/11/13 Today's Date: 05/22/2019   History of Present Illness  Pt adm with acute on chronic hypoxic respiratory failure and found to have rt sided effusion. PMH - copd on home O2, afib, htn, ckd, colon CA, etoh abuse  Clinical Impression  Pt presents to PT with decreased mobility and decreased functional activity tolerance. Pt with hospitalization a couple of months ago and really struggled initially on return home with activity tolerance. Recommend CIR to prepare pt for transition home.     Follow Up Recommendations CIR    Equipment Recommendations  None recommended by PT    Recommendations for Other Services       Precautions / Restrictions Precautions Precautions: Fall Restrictions Weight Bearing Restrictions: No      Mobility  Bed Mobility Overal bed mobility: Needs Assistance Bed Mobility: Supine to Sit     Supine to sit: Min assist     General bed mobility comments: Assist to elevate trunk into sitting  Transfers Overall transfer level: Needs assistance Equipment used: Rolling walker (2 wheeled) Transfers: Sit to/from Stand Sit to Stand: Min assist         General transfer comment: Assist to bring hips up and for balance  Ambulation/Gait Ambulation/Gait assistance: Min assist Gait Distance (Feet): 120 Feet Assistive device: Rolling walker (2 wheeled) Gait Pattern/deviations: Step-through pattern;Decreased stride length;Trunk flexed;Drifts right/left Gait velocity: decr Gait velocity interpretation: <1.8 ft/sec, indicate of risk for recurrent falls General Gait Details: Assist for balance and support. Pt amb on 6L of O2 with SpO2 >92%.   Stairs            Wheelchair Mobility    Modified Rankin (Stroke Patients Only)       Balance Overall balance assessment: Needs assistance Sitting-balance support: No upper extremity supported;Feet supported Sitting  balance-Leahy Scale: Fair     Standing balance support: Bilateral upper extremity supported Standing balance-Leahy Scale: Poor Standing balance comment: walker and min guard for static standing                             Pertinent Vitals/Pain Pain Assessment: No/denies pain    Home Living Family/patient expects to be discharged to:: Private residence Living Arrangements: Spouse/significant other Available Help at Discharge: Family;Available 24 hours/day Type of Home: House Home Access: Stairs to enter   [employer] of Steps: 3-4 Home Layout: One level Home Equipment: Walker - 2 wheels;Shower seat Additional Comments: Home O2    Prior Function Level of Independence: Needs assistance   Gait / Transfers Assistance Needed: Per pt he was amb without assistive device.           Hand Dominance   Dominant Hand: Right    Extremity/Trunk Assessment   Upper Extremity Assessment Upper Extremity Assessment: Generalized weakness    Lower Extremity Assessment Lower Extremity Assessment: Generalized weakness       Communication   Communication: HOH  Cognition Arousal/Alertness: Awake/alert Behavior During Therapy: WFL for tasks assessed/performed Overall Cognitive Status: Within Functional Limits for tasks assessed                                        General Comments      Exercises     Assessment/Plan    PT Assessment Patient needs continued PT services  PT  Problem List Decreased strength;Decreased activity tolerance;Decreased balance;Decreased mobility;Cardiopulmonary status limiting activity       PT Treatment Interventions DME instruction;Gait training;Stair training;Functional mobility training;Therapeutic activities;Therapeutic exercise;Balance training;Patient/family education    PT Goals (Current goals can be found in the Care Plan section)  Acute Rehab PT Goals Patient Stated Goal: get stronger PT Goal  Formulation: With patient Time For Goal Achievement: 06/05/19 Potential to Achieve Goals: Good    Frequency Min 3X/week   Barriers to discharge Inaccessible home environment stairs to enter    Co-evaluation               AM-PAC PT "6 Clicks" Mobility  Outcome Measure Help needed turning from your back to your side while in a flat bed without using bedrails?: A Little Help needed moving from lying on your back to sitting on the side of a flat bed without using bedrails?: A Little Help needed moving to and from a bed to a chair (including a wheelchair)?: A Little Help needed standing up from a chair using your arms (e.g., wheelchair or bedside chair)?: A Little Help needed to walk in hospital room?: A Little Help needed climbing 3-5 steps with a railing? : A Lot 6 Click Score: 17    End of Session Equipment Utilized During Treatment: Gait belt;Oxygen Activity Tolerance: Patient tolerated treatment well Patient left: in chair;with call bell/phone within reach Nurse Communication: Mobility status PT Visit Diagnosis: Unsteadiness on feet (R26.81);Other abnormalities of gait and mobility (R26.89);Muscle weakness (generalized) (M62.81)    Time: 3419-3790 PT Time Calculation (min) (ACUTE ONLY): 22 min   Charges:   PT Evaluation $PT Eval Moderate Complexity: Roberta Pager [phone] Office [place] 05/22/2019, 5:20 PM

## 2019-05-22 NOTE — Progress Notes (Signed)
[place] NOTE   Pharmacy Consult for heparin Indication: atrial fibrillation  Patient Measurements: Height: 5\' 10"  (177.8 cm) Weight: 226 lb 10.1 oz (102.8 kg) IBW/kg (Calculated) : 73 Heparin Dosing Weight: 93.8kg  Vital Signs: Temp: 99.1 F (37.3 C) (06/02 0809) Temp Source: Oral (06/02 0809) BP: 117/61 (06/02 0700) Pulse Rate: 88 (06/02 0700)  Labs: Recent Labs    05/20/19 0301 05/20/19 1555 05/21/19 0524 05/22/19 0307  HGB 9.2*  --  8.3* 8.1*  HCT 31.1*  --  27.6* 27.6*  PLT 204  --  190 198  APTT 155* 83* 75* 61*  HEPARINUNFRC >2.20*  --  1.66* 1.03*  CREATININE 2.93*  --  3.70* 3.71*  TROPONINI  --   --  <0.03  --     Estimated Creatinine Clearance: 18.1 mL/min (A) (by C-G formula based on SCr of 3.71 mg/dL (H)).  Assessment: 89 yom presented to the ED with CP. He is on chronic xarelto for history of afib which was transitioned to IV heparin.   Following aptt as hep lvl still affected by PTA xarelto APTT 61 - slightly subtherapeutic. Dark red output from chest tube post thoracentesis - discussed with Dr. Chase Caller who feels this is more inflammation output, not frank bleeding and ok to continue heparin and increase as needed.   Pharmacy also consulted to dose Cefepime for pneumonia. Cultures are pending. SCr is rising at 3.71 today with estimated CrCl ~ 18 mL/min and low UOP at 0.3 mL/kg/hr.  Vancomycin has been discontinued. WBC is trending down to normal limits. Patient remains afebrile.   Goal of Therapy:  Heparin level 0.3-0.7 units/ml aPTT 66-102 seconds Monitor platelets by anticoagulation protocol: Yes   Plan:  Increase heparin gtt 1200 units/hr Recheck APTT in 8 hours.  Daily heparin level, aPTT and CBC  Continue Cefepime at 2g IV every 24 hours per updated protocol dosing. Monitor renal function and need to adjust.  Monitor culture results.   Sloan Leiter, PharmD, BCPS, BCCCP Clinical Pharmacist Clinical phone  05/22/2019 until 3:30PM [phone]  Please check AMION for all [place] numbers 05/22/2019 9:36 AM

## 2019-05-22 NOTE — Progress Notes (Addendum)
PROGRESS NOTE    YAIDEN Barton   QPY:[id number]  DOB: 09-29-1933  DOA: 05/19/2019 PCP: Maury Dus, MD   Brief Narrative:  David Barton is an 85 y/o male with COPD,chronic resp failure on 4 L O2 at home,   systolic CHF with EF of 12% in 2017 but now normal,  A-fib/PSVT with h/o TEE cardioversion, HTN, CKD 4, ETOH abuse, colon cancer, who comes in for mid sternal chest pain radiating to the neck.  In the ED he was noted to become progressively hypoxic on his home O2 and was placed on BiPAP.  CXR > RLL collapse with mediastinal shift to the right, stable opacities in right lung, increasing opacities in LL base.  Procalcitonin 2.52, EBC 12, afebrile, BNP 465 Started on Vanc and Cefepime and admitted by [facility] to ICU. Elilquis changed to Heparin infusion.    CT on 5/31 >  Marked worsening consolidation in the right upper and lower lobes. Improved left lower lobe consolidation and left pleural effusion Worsening right pleural effusion which is moderate.  Last admitted from 03/03/19 through 03/26/19 for CAP, (fever rigors, strep pneumo on resp culture), septic shock, acute encephalopathy, acute CHF, mucous plugging with LLL collapse, New onset A-fib.    Subjective:  He has less dyspnea today. He had cough when laying in bed this AM but it went away after getting up to the chair. He is a bit constipated- last BM was on Saturday.     Assessment & Plan:   Principal Problem:   Acute on chronic hypoxemic respiratory failure /  Right sided effusion - on Cefepime - Vanc d/c'd as MRSA PCR neg - CT shows worsening R consolidation and increasing R pleural effusion- IR guided Thora done and results reviewed > red fluid with 1463 WBC, 82 % Neutrophils, no bacteria on gram stain, LDH 140, Glucose 136 - ECHO noted below- EF dropped to 45 % from 60-65% - Dr Chase Caller recommends cardiology consult which I have requested - Obtain SLP eval for possible aspiration- MBS is negative for aspiration -  Acetylcholine receptor antibody ordered by PCCM and pending - temp 99.1 today- WBC and Pro calcitonin improving - blood cultures remain negative, pleural fluid culture ordered - continue IS to open up lungs - pulmonary also recommending an ABG prior to d/c as he may need a BiPAP when discharged  Active Problems:  AKI on CKD 4 - baseline Cr ~ 2.5 - 6/1> CR  3.70- d/c'd Lasix on 6/1 - Cr remains about the same today  Chronic systolic CHF- EF 45-80% - see ECHO below- last EF was 60-65%     Atrial fibrillation with RVR   - failed TEE cardioversion on 03/16/19 - Xarelto changed to Heparin by PCCM for procedures - cont Amiodarone, Zebeta - Cardizem on hold  Elevated TSH - possibly sick euthyroid- hold off on treating  Gout - resumed allopurinol  Morbid obesity Body mass index is 32.52 kg/m.    Time spent in minutes: 35 min -   DVT prophylaxis:  Heparin infusion Code Status: DNR Family Communication: wife, Mikias Lanz Disposition Plan: cardiology consulted - d/c plans are to go to CIR or SNF as when he was last here, he was recommended to go to rehab but due to [place] was sent home instead -  I have ordered a PT eval -  Addendum: I have received a page that PT recommends CIR- will request consult Consultants:   PCCM  Cardiology Procedures:  IR right thoracentesis  2 D ECHO  1. The left ventricle has mildly reduced systolic function, with an ejection fraction of 45-50%. The cavity size was normal. There is mildly increased left ventricular wall thickness. Left ventricular diastolic Doppler parameters are indeterminate.  2. LV endocardium difficult to see (No Definity used). EF appears to be ~45-50% with hypokinesis of the inferolateral, inferoseptal and infero-apical myocardium. Consider repeat imaging with Definity to better assess.  3. Left atrial size was moderately dilated.  4. Right atrial size was moderately dilated.  5. Small pericardial effusion.  6. The  pericardial effusion is posterior to the left ventricle and circumferential.  7. Mild calcification of the mitral valve leaflet.  8. The aortic valve is tricuspid. Moderate calcification of the aortic valve. No stenosis of the aortic valve.  9. The aortic root and ascending aorta are normal in size and structure. 10. The inferior vena cava was dilated in size with <50% respiratory variability.  11. The interatrial septum was not well visualized.   Antimicrobials:  Anti-infectives (From admission, onward)   Start     Dose/Rate Route Frequency Ordered Stop   05/21/19 1200  vancomycin (VANCOCIN) IVPB 1000 mg/200 mL premix  Status:  Discontinued     1,000 mg 200 mL/hr over 60 Minutes Intravenous Every 48 hours 05/19/19 1130 05/20/19 1133   05/20/19 1200  ceFEPIme (MAXIPIME) 2 g in sodium chloride 0.9 % 100 mL IVPB     2 g 200 mL/hr over 30 Minutes Intravenous Every 24 hours 05/19/19 1444     05/19/19 1130  ceFEPIme (MAXIPIME) 1 g in sodium chloride 0.9 % 100 mL IVPB  Status:  Discontinued     1 g 200 mL/hr over 30 Minutes Intravenous  Once 05/19/19 1116 05/19/19 1123   05/19/19 1130  ceFEPIme (MAXIPIME) 2 g in sodium chloride 0.9 % 100 mL IVPB     2 g 200 mL/hr over 30 Minutes Intravenous  Once 05/19/19 1123 05/19/19 1317   05/19/19 1130  vancomycin (VANCOCIN) 2,000 mg in sodium chloride 0.9 % 500 mL IVPB     2,000 mg 250 mL/hr over 120 Minutes Intravenous  Once 05/19/19 1123 05/19/19 1638       Objective: Vitals:   05/22/19 0400 05/22/19 0500 05/22/19 0600 05/22/19 0700  BP:  108/60 109/62 117/61  Pulse: 90 89 91 88  Resp: (!) 23 (!) 23 (!) 24   Temp:      TempSrc:      SpO2: 96% 98% 91%   Weight:      Height:        Intake/Output Summary (Last 24 hours) at 05/22/2019 0736 Last data filed at 05/22/2019 0600 Gross per 24 hour  Intake 1686.97 ml  Output 775 ml  Net 911.97 ml   Filed Weights   05/19/19 0849 05/20/19 0500 05/21/19 0317  Weight: 99.8 kg 102.4 kg 102.8 kg     Examination: General exam: Appears comfortable  HEENT: PERRLA, oral mucosa moist, no sclera icterus or thrush Respiratory system: Crackles at both bases, pulse ox 95% on 5 L O2,  Respiratory effort normal. Cardiovascular system: S1 & S2 heard,  No murmurs  Gastrointestinal system: Abdomen soft, non-tender, nondistended. Normal bowel sounds   Central nervous system: Alert and oriented. No focal neurological deficits. Extremities: No cyanosis, clubbing or edema Skin: No rashes or ulcers Psychiatry:  Mood & affect appropriate.      Data Reviewed: I have personally reviewed following labs and imaging studies  CBC: Recent Labs  Lab 05/19/19  0901 05/19/19 1751 05/20/19 0301 05/21/19 0524 05/22/19 0307  WBC 12.7*  --  16.9* 11.9* 9.7  NEUTROABS 9.2*  --   --   --   --   HGB 10.0* 9.5* 9.2* 8.3* 8.1*  HCT 34.1* 28.0* 31.1* 27.6* 27.6*  MCV 108.9*  --  108.0* 107.4* 107.4*  PLT 237  --  204 190 109   Basic Metabolic Panel: Recent Labs  Lab 05/19/19 0901 05/19/19 1632 05/19/19 1751 05/20/19 0301 05/21/19 0524 05/22/19 0307  NA 140  --  140 141 137  --   K 4.4  --  4.7 4.8 4.5  --   CL 90*  --   --  98 96*  --   CO2 34*  --   --  32 30  --   GLUCOSE 129*  --   --  114* 122*  --   BUN 59*  --   --  60* 75*  --   CREATININE 2.86*  --   --  2.93* 3.70*  --   CALCIUM 9.9  --   --  8.9 8.6*  --   MG  --  1.8  --   --  2.3 2.3  PHOS  --  4.1  --   --  4.7* 4.4   GFR: Estimated Creatinine Clearance: 18.2 mL/min (A) (by C-G formula based on SCr of 3.7 mg/dL (H)). Liver Function Tests: Recent Labs  Lab 05/21/19 0524  AST 14*  ALT 10  ALKPHOS 50  BILITOT 0.5  PROT 6.0*  ALBUMIN 2.3*   No results for input(s): LIPASE, AMYLASE in the last 168 hours. No results for input(s): AMMONIA in the last 168 hours. Coagulation Profile: No results for input(s): INR, PROTIME in the last 168 hours. Cardiac Enzymes: Recent Labs  Lab 05/19/19 0901 05/21/19 0524  TROPONINI <0.03  <0.03   BNP (last 3 results) Recent Labs    05/04/19 1137  PROBNP 443.0*   HbA1C: No results for input(s): HGBA1C in the last 72 hours. CBG: Recent Labs  Lab 05/19/19 1601 05/20/19 0847  GLUCAP 110* 104*   Lipid Profile: No results for input(s): CHOL, HDL, LDLCALC, TRIG, CHOLHDL, LDLDIRECT in the last 72 hours. Thyroid Function Tests: Recent Labs    05/19/19 1632  TSH 4.099   Anemia Panel: No results for input(s): VITAMINB12, FOLATE, FERRITIN, TIBC, IRON, RETICCTPCT in the last 72 hours. Urine analysis:    Component Value Date/Time   COLORURINE YELLOW 05/19/2019 [address] 05/19/2019 1746   LABSPEC 1.015 05/19/2019 1746   PHURINE 5.0 05/19/2019 1746   GLUCOSEU NEGATIVE 05/19/2019 1746   [place] 05/19/2019 [place] 05/19/2019 [place] 05/19/2019 1746   PROTEINUR 100 (A) 05/19/2019 1746   UROBILINOGEN 1.0 05/08/2013 1738   NITRITE NEGATIVE 05/19/2019 1746   LEUKOCYTESUR TRACE (A) 05/19/2019 1746   Sepsis Labs: @LABRCNTIP (procalcitonin:4,lacticidven:4) ) Recent Results (from the past 240 hour(s))  SARS Coronavirus 2 (CEPHEID- Performed in [place] hospital lab), Hosp Order     Status: None   Collection Time: 05/19/19  9:35 AM  Result Value Ref Range Status   SARS Coronavirus 2 NEGATIVE NEGATIVE Final    Comment: (NOTE) If result is NEGATIVE SARS-CoV-2 target nucleic acids are NOT DETECTED. The SARS-CoV-2 RNA is generally detectable in upper and lower  respiratory specimens during the acute phase of infection. The lowest  concentration of SARS-CoV-2 viral copies this assay can detect is 250  copies /  mL. A negative result does not preclude SARS-CoV-2 infection  and should not be used as the sole basis for treatment or other  patient management decisions.  A negative result may occur with  improper specimen collection / handling, submission of specimen other  than nasopharyngeal swab, presence of  viral mutation(s) within the  areas targeted by this assay, and inadequate number of viral copies  (<250 copies / mL). A negative result must be combined with clinical  observations, patient history, and epidemiological information. If result is POSITIVE SARS-CoV-2 target nucleic acids are DETECTED. The SARS-CoV-2 RNA is generally detectable in upper and lower  respiratory specimens dur ing the acute phase of infection.  Positive  results are indicative of active infection with SARS-CoV-2.  Clinical  correlation with patient history and other diagnostic information is  necessary to determine patient infection status.  Positive results do  not rule out bacterial infection or co-infection with other viruses. If result is PRESUMPTIVE POSTIVE SARS-CoV-2 nucleic acids MAY BE PRESENT.   A presumptive positive result was obtained on the submitted specimen  and confirmed on repeat testing.  While 2019 novel coronavirus  (SARS-CoV-2) nucleic acids may be present in the submitted sample  additional confirmatory testing may be necessary for epidemiological  and / or clinical management purposes  to differentiate between  SARS-CoV-2 and other Sarbecovirus currently known to infect humans.  If clinically indicated additional testing with an alternate test  methodology [phone]) is advised. The SARS-CoV-2 RNA is generally  detectable in upper and lower respiratory sp ecimens during the acute  phase of infection. The expected result is Negative. Fact Sheet for Patients:  StrictlyIdeas.no Fact Sheet for Healthcare Providers: BankingDealers.co.za This test is not yet approved or cleared by the [place] FDA and has been authorized for detection and/or diagnosis of SARS-CoV-2 by FDA under an Emergency Use Authorization (EUA).  This EUA will remain in effect (meaning this test can be used) for the duration of the COVID-19 declaration under Section  564(b)(1) of the Act, 21 U.S.C. section 360bbb-3(b)(1), unless the authorization is terminated or revoked sooner. Performed at [facility], [place] [address] [place], [place] [address]   Culture, blood (routine x 2)     Status: None (Preliminary result)   Collection Time: 05/19/19 11:38 AM  Result Value Ref Range Status   Specimen Description BLOOD LEFT ANTECUBITAL  Final   Special Requests   Final    BOTTLES DRAWN AEROBIC AND ANAEROBIC Blood Culture adequate volume   Culture   Final    NO GROWTH 3 DAYS Performed at [facility], [place] [address], [place], [place] [address]    Report Status PENDING  Incomplete  Culture, blood (routine x 2)     Status: None (Preliminary result)   Collection Time: 05/19/19 11:40 AM  Result Value Ref Range Status   Specimen Description BLOOD RIGHT ANTECUBITAL  Final   Special Requests   Final    BOTTLES DRAWN AEROBIC AND ANAEROBIC Blood Culture results may not be optimal due to an inadequate volume of blood received in culture bottles   Culture   Final    NO GROWTH 3 DAYS Performed at [facility], [place] [address] [place],  [address]    Report Status PENDING  Incomplete  MRSA PCR Screening     Status: None   Collection Time: 05/19/19  4:20 PM  Result Value Ref Range Status   MRSA by PCR NEGATIVE NEGATIVE Final  Comment:        The GeneXpert MRSA Assay (FDA approved for NASAL specimens only), is one component of a comprehensive MRSA colonization surveillance program. It is not intended to diagnose MRSA infection nor to guide or monitor treatment for MRSA infections. Performed at [facility], [place] [address] [place], [place] [address]   Culture, body fluid-bottle     Status: None (Preliminary result)   Collection Time: 05/21/19  2:08 PM  Result Value Ref Range Status   Specimen Description FLUID  Final   Special Requests RIGHT PLEURAL  Final   Culture   Final    NO GROWTH < 24 HOURS Performed at [facility], [place] [address], [place], [place] [address]    Report Status PENDING  Incomplete  Gram stain     Status: None   Collection Time: 05/21/19  2:08 PM  Result Value Ref Range Status   Specimen Description FLUID  Final   Special Requests RIGHT PLEURAL  Final   Gram Stain   Final    ABUNDANT WBC PRESENT, PREDOMINANTLY PMN NO ORGANISMS SEEN Performed at [facility], [place] [address] [place], [place] [address]    Report Status 05/21/2019 FINAL  Final         Radiology Studies: Dg Chest 1 View  Result Date: 05/21/2019 CLINICAL DATA:  Status post right thoracentesis EXAM: CHEST  1 VIEW COMPARISON:  05/20/2019 CT FINDINGS: Cardiac shadow is enlarged but stable. Mild atelectasis is noted in the right mid and lower lung. No pneumothorax is noted following thoracentesis. No bony abnormality is seen. IMPRESSION: No pneumothorax following right-sided thoracentesis. Mild atelectatic changes on the right. Electronically Signed   By: Inez Catalina M.D.   On: 05/21/2019 14:30   Ct Chest Wo Contrast  Result Date: 05/20/2019 CLINICAL DATA:  Unresolved pneumonia EXAM: CT CHEST WITHOUT CONTRAST TECHNIQUE: Multidetector CT imaging of the chest was performed following the standard protocol without IV contrast. COMPARISON:  03/19/2019 FINDINGS: Cardiovascular: Atherosclerotic calcifications of the aortic arch, great vessels, and coronary arteries are noted. No evidence of aortic aneurysm. Mediastinum/Nodes: No abnormal mediastinal adenopathy. Visualized thyroid is unremarkable. Small pericardial effusion is new. Lungs/Pleura: Left lower lobe consolidation and left pleural effusion are improved. Right lower lobe consolidation has markedly worsened. A moderate right pleural effusion has worsened. There is also patchy airspace disease throughout the right upper lobe. Subsegmental atelectasis in the right middle lobe. No pneumothorax. Centrilobular emphysema towards the lung apices. Upper Abdomen: No acute  abnormality. Musculoskeletal: No vertebral compression deformity. IMPRESSION: Marked worsening consolidation in the right upper and lower lobes. Improved left lower lobe consolidation and left pleural effusion Worsening right pleural effusion which is moderate. New small pericardial effusion. Aortic Atherosclerosis (ICD10-I70.0) and Emphysema (ICD10-J43.9). Electronically Signed   By: Marybelle Killings M.D.   On: 05/20/2019 13:18   Dg Swallowing Func-speech Pathology  Result Date: 05/21/2019 Objective Swallowing Evaluation: Type of Study: MBS-Modified Barium Swallow Study  Patient Details Name: MAURION WALKOWIAK MRN: [id number] Date of Birth: 01/25/1933 Today's Date: 05/21/2019 Time: SLP Start Time (ACUTE ONLY): 1300 -SLP Stop Time (ACUTE ONLY): 1315 SLP Time Calculation (min) (ACUTE ONLY): 15 min Past Medical History: Past Medical History: Diagnosis Date  Alcohol abuse   Colon cancer ([place])   COPD (chronic obstructive pulmonary disease) ([place])   Emphysema of lung ([place])   Former tobacco use   Hypertension   Peripheral edema   Sepsis ([place]) 10/2016  Aspiration PNA/C diff colitis Past Surgical  History: Past Surgical History: Procedure Laterality Date  CARDIOVERSION N/A 03/16/2019  Procedure: CARDIOVERSION;  Surgeon: Pixie Casino, MD;  Location: [facility];  Service: Cardiovascular;  Laterality: N/A;  CARDIOVERSION N/A 03/23/2019  Procedure: CARDIOVERSION;  Surgeon: Lelon Perla, MD;  Location: [facility] ENDOSCOPY;  Service: Cardiovascular;  Laterality: N/A;  CATARACT EXTRACTION    COLON RESECTION    COLONOSCOPY    IMPLANTATION BONE ANCHORED HEARING AID Left 2016  MINOR HEMORRHOIDECTOMY  1995  TEE WITHOUT CARDIOVERSION N/A 03/16/2019  Procedure: TRANSESOPHAGEAL ECHOCARDIOGRAM (TEE);  Surgeon: Pixie Casino, MD;  Location: [facility] ENDOSCOPY;  Service: Cardiovascular;  Laterality: N/A;  TONSILLECTOMY   HPI: Patient is an 85 y.o. male with PMH: COPD: oxygen dependent on 4L at home, atrial fibrillation on Xarelto,  diastolic heart failure, cardiomyopathy, chronic kidney disease, colon cancer, ETOH abuse, who also had a prolonged hospitalization from March to April for septic shock. He arrived at the hospital on 5/30 with mid-sternal chest pain radiating to the neck and 2 week h/o worsening breathing. CXR revealed RLL collapse, mediastinal shift to the right, stable opacities in right lung, increasing opacities in LL base.  Repeat CXR revealed small to moderate right pleural effusion and trace left pleural effusion.  Subjective: pleasant, no c/o swallow problems Assessment / Plan / Recommendation CHL IP CLINICAL IMPRESSIONS 05/21/2019 Clinical Impression Patient presents with an oropharyngeal swallow that is WFL-WNL without aspiration or penetration even when taking large volume sips of thin liquids via cup and straw sips. He exhbited a trace amount of vallecular residuals, but these fully cleared with spontaneous swallowing.  SLP Visit Diagnosis Dysphagia, unspecified (R13.10) Attention and concentration deficit following -- Frontal lobe and executive function deficit following -- Impact on safety and function No limitations   CHL IP TREATMENT RECOMMENDATION 05/21/2019 Treatment Recommendations No treatment recommended at this time   Prognosis 05/21/2019 Prognosis for Safe Diet Advancement Good Barriers to Reach Goals -- Barriers/Prognosis Comment -- CHL IP DIET RECOMMENDATION 05/21/2019 SLP Diet Recommendations Regular solids;Thin liquid Liquid Administration via Cup;Straw Medication Administration Whole meds with liquid Compensations -- Postural Changes --   No flowsheet data found.  CHL IP FOLLOW UP RECOMMENDATIONS 05/21/2019 Follow up Recommendations None   No flowsheet data found.     CHL IP ORAL PHASE 05/21/2019 Oral Phase WFL Oral - Pudding Teaspoon -- Oral - Pudding Cup -- Oral - Honey Teaspoon -- Oral - Honey Cup -- Oral - Nectar Teaspoon -- Oral - Nectar Cup -- Oral - Nectar Straw -- Oral - Thin Teaspoon -- Oral - Thin Cup --  Oral - Thin Straw -- Oral - Puree -- Oral - Mech Soft -- Oral - Regular -- Oral - Multi-Consistency -- Oral - Pill -- Oral Phase - Comment --  CHL IP PHARYNGEAL PHASE 05/21/2019 Pharyngeal Phase WFL Pharyngeal- Pudding Teaspoon -- Pharyngeal -- Pharyngeal- Pudding Cup -- Pharyngeal -- Pharyngeal- Honey Teaspoon -- Pharyngeal -- Pharyngeal- Honey Cup -- Pharyngeal -- Pharyngeal- Nectar Teaspoon -- Pharyngeal -- Pharyngeal- Nectar Cup -- Pharyngeal -- Pharyngeal- Nectar Straw -- Pharyngeal -- Pharyngeal- Thin Teaspoon -- Pharyngeal -- Pharyngeal- Thin Cup -- Pharyngeal -- Pharyngeal- Thin Straw -- Pharyngeal -- Pharyngeal- Puree -- Pharyngeal -- Pharyngeal- Mechanical Soft -- Pharyngeal -- Pharyngeal- Regular -- Pharyngeal -- Pharyngeal- Multi-consistency -- Pharyngeal -- Pharyngeal- Pill -- Pharyngeal -- Pharyngeal Comment --  CHL IP CERVICAL ESOPHAGEAL PHASE 05/21/2019 Cervical Esophageal Phase WFL Pudding Teaspoon -- Pudding Cup -- Honey Teaspoon -- Honey Cup -- Nectar Teaspoon -- Nectar Cup -- Nectar Straw --  Thin Teaspoon -- Thin Cup -- Thin Straw -- Puree -- Mechanical Soft -- Regular -- Multi-consistency -- Pill -- Cervical Esophageal Comment -- [name] 05/21/2019, 1:47 PM Sonia Baller, MA, CCC-SLP Speech Therapy [facility] Acute Rehab Pager: [phone]              Ir Thoracentesis Asp Pleural Space W/img Guide  Result Date: 05/21/2019 INDICATION: Patient with history of COPD, emphysema, acute on chronic hypoxic and hypercapnic respiratory failure, and right pleural effusion. Request is made for diagnostic and therapeutic right thoracentesis. EXAM: ULTRASOUND GUIDED DIAGNOSTIC AND THERAPEUTIC RIGHT THORACENTESIS MEDICATIONS: 10 mL 1% lidocaine COMPLICATIONS: None immediate. PROCEDURE: An ultrasound guided thoracentesis was thoroughly discussed with the patient and questions answered. The benefits, risks, alternatives and complications were also discussed. The patient understands and wishes to proceed with  the procedure. Written consent was obtained. Ultrasound was performed to localize and mark an adequate pocket of fluid in the right chest. The area was then prepped and draped in the normal sterile fashion. 1% Lidocaine was used for local anesthesia. Under ultrasound guidance a 6 Fr Safe-T-Centesis catheter was introduced. Thoracentesis was performed. The catheter was removed and a dressing applied. FINDINGS: A total of approximately 1.85 L of dark red fluid was removed. Samples were sent to the laboratory as requested by the clinical team. IMPRESSION: Successful ultrasound guided right thoracentesis yielding 1.85 L of pleural fluid. Read by: Earley Abide, PA-C Electronically Signed   By: Jerilynn Mages.  Shick M.D.   On: 05/21/2019 14:31      Scheduled Meds:  allopurinol  100 mg Oral Daily   amiodarone  200 mg Oral Daily   bisoprolol  5 mg Oral Daily   Chlorhexidine Gluconate Cloth  6 each Topical Daily   ferrous sulfate  325 mg Oral Daily   sodium chloride flush  3 mL Intravenous Q12H   Continuous Infusions:  sodium chloride     ceFEPime (MAXIPIME) IV 200 mL/hr at 05/21/19 1234   [START ON 05/24/2019] ferumoxytol     heparin 1,100 Units/hr (05/21/19 2100)     LOS: 3 days      Debbe Odea, MD [facility] Pager: [email or web address] Password [facility] 05/22/2019, 7:36 AM

## 2019-05-22 NOTE — Progress Notes (Signed)
NAME:  David Barton, MRN:  [id number], DOB:  1933/01/19, LOS: 3 ADMISSION DATE:  05/19/2019, CONSULTATION DATE:  5/30  REFERRING MD:  Lanny Cramp PA  CHIEF COMPLAINT:  Hypoxia   Brief History    85 year old male former smoker with underlying moderate COPD (fev1 > 60% in 2014) and oxygen dependent respiratory failure.  Patient lives at home with his wife.  He presents to the emergency room on May 19, 2019 with chest pain and shortness of breath , found to have hypoxemia and increased oxygen demands. Recent prolonged admission in March-April for  pneumonia and septic shock.  Complicated by new onset A. fib with RVR and worsening renal failure. Patient was recently seen for a pulmonary consult with Dr. Melvyn Novas in the outpatient setting.  Patient was seen for a post hospital follow-up for recent pneumonia.  Patient was having worsening shortness of breath , cough , blood tinged mucus and increased oxygen demands at 4 L.  Chest x-ray showed multifocal airspace disease on the right. Patient was treated for a pneumonia with Levaquin x7 days.. Follow-up chest x-ray on April 04, 2019 showed movement in the right upper lobe with residual right lower lobe disease and small right effusion.  And stable left lower lobe airspace disease. Patient did have an elevated sed rate , concern for possible amiodarone pulmonary toxicity was considered , however chest after antibiotics on 5/15 showed improvement.  Patient says over the last 2 weeks breathing has gotten worse in the morning of arrival to the ER he had chest pain and worsening shortness of breath. He has been afebrile.  Blood pressure was stable.  White blood cell count was mildly elevated at 12.7.  ED course: Chest x-ray with increased collapse of the right lower lobe diffuse opacities to the right lung.  And a right pleural effusion, lactic acid 1.4.  Troponin 0 0.03.  BNP is 465, creatinine 2.86, potassium 4.4.,  COVID-19 SARS NEG .     Past Medical History    A. fib on Xarelto, diastolic heart failure, cardiomyopathy, Chronic kidney disease, colon cancer history, EtOH abuse history    Significant Hospital Events   05/19/2019  - admit and O2 sats 70s-low 80s on 6l/m [place] . Alert and talking, following commands Results for OLE, LAFON (MRN [id number]) as of 05/20/2019 11:02  Ref. Range 05/04/2019 11:37 05/19/2019 16:32  Sed Rate Latest Ref Range: 0 - 16 mm/hr 75 (H) 84 (H)    5/31 -  refusing bipap. Wants to eat. Oriented x 4. Calm. On HFNC 15L [place] and refusing bipap. DNR/DNI. RN asking if ok to transfer. Making urine. Not on pressors. On IV heparin in lieu of home eliquis. No bedsores per RN.. . COVID negative. Urine strep negative. Baseline CKD - creat 2.5 - 81m% in 2020. DNR - and he confirmed against intubation. Reluctantly agreed to bipap QHS x 4h.  Home o2 3-4L [place]. Was walking at home "Recently"   05/21/2019 - wore bipap 4h last night. Did not wear more due to vertigo > Rising PCT to 6.7 c/w sepsis but no fever. Worsening renal failure to creat 3.7. TSH normal. This am feels better. 8L [place] -> 100%, 6L [place] -> 97%. CT shows moderate rt pleural effusion with RLL Consolidation and new small pericardial effusion.   Consults:    Procedures:    Significant Diagnostic Tests:    Micro Data:  5/30 BC x 2 >>   Antimicrobials:  5/30 Maxipime>> (recommend 7-8 days)  5/30 Vanc >> 5/31  Interim history/subjective:    6/2 - creat still lat 3.7 despite dc lasix. Ech with EF drop from 65%in march 2020 to 45% now and small pericardial  effusion.  Rt thora 1.8L - mild exudate dark red but does not need chest tube. Did wear biPap last night. On 5 LNC. Passed MBS - no aspiration  PCT improving. 5L - 98%. Did not wear BiPAP last night  Objective   Blood pressure 117/61, pulse 88, temperature 99.1 F (37.3 C), temperature source Oral, resp. rate (!) 24, height 5' 10" (1.778 m), weight 102.8 kg, SpO2 91 %.        Intake/Output Summary (Last 24 hours)  at 05/22/2019 0912 Last data filed at 05/22/2019 0900 Gross per 24 hour  Intake 1964.97 ml  Output 500 ml  Net 1464.97 ml   Filed Weights   05/19/19 0849 05/20/19 0500 05/21/19 0317  Weight: 99.8 kg 102.4 kg 102.8 kg    General Appearance:  Looks deconditioned OBESE - + Head:  Normocephalic, without obvious abnormality, atraumatic Eyes:  PERRL - yes, conjunctiva/corneas - muddy     Ears:  Normal external ear canals, both ears Nose:  G tube - no but has [place] +  Throat:  ETT TUBE - no , OG tube - no Neck:  Supple,  No enlargement/tenderness/nodules Lungs: OVerall diminished. Wheeze + at base R > L Heart:  S1 and S2 normal, no murmur, CVP - no.  Pressors - no Abdomen:  Soft, no masses, no organomegaly Genitalia / Rectal:  Not done but has condom cath Extremities:  Extremities- intact with some edema Skin:  ntact in exposed areas . Sacral area - not examined Neurologic:  Sedation - none -> RASS - +1 . Moves all 4s - yues. CAM-ICU - neg . Orientation - x3+. Sitting in chair and reading book       LABS    PULMONARY Recent Labs  Lab 05/19/19 1751  PHART 7.342*  PCO2ART 63.7*  PO2ART 68.0*  HCO3 34.6*  TCO2 36*  O2SAT 91.0    CBC Recent Labs  Lab 05/20/19 0301 05/21/19 0524 05/22/19 0307  HGB 9.2* 8.3* 8.1*  HCT 31.1* 27.6* 27.6*  WBC 16.9* 11.9* 9.7  PLT 204 190 198    COAGULATION No results for input(s): INR in the last 168 hours.  CARDIAC   Recent Labs  Lab 05/19/19 0901 05/21/19 0524  TROPONINI <0.03 <0.03   No results for input(s): PROBNP in the last 168 hours.   CHEMISTRY Recent Labs  Lab 05/19/19 0901 05/19/19 1632 05/19/19 1751 05/20/19 0301 05/21/19 0524 05/22/19 0307  NA 140  --  140 141 137 140  K 4.4  --  4.7 4.8 4.5 4.5  CL 90*  --   --  98 96* 98  CO2 34*  --   --  32 30 30  GLUCOSE 129*  --   --  114* 122* 111*  BUN 59*  --   --  60* 75* 78*  CREATININE 2.86*  --   --  2.93* 3.70* 3.71*  CALCIUM 9.9  --   --  8.9 8.6* 8.7*  MG   --  1.8  --   --  2.3 2.3  PHOS  --  4.1  --   --  4.7* 4.4   Estimated Creatinine Clearance: 18.1 mL/min (A) (by C-G formula based on SCr of 3.71 mg/dL (H)).   LIVER Recent Labs  Lab 05/21/19 0524  AST  14*  ALT 10  ALKPHOS 50  BILITOT 0.5  PROT 6.0*  ALBUMIN 2.3*     INFECTIOUS Recent Labs  Lab 05/19/19 1140 05/19/19 1632 05/20/19 0301 05/21/19 0524 05/22/19 0307  LATICACIDVEN 1.4  --  1.0  --   --   PROCALCITON  --  2.52  --  6.71 5.44     ENDOCRINE CBG (last 3)  Recent Labs    05/19/19 1601 05/20/19 0847  GLUCAP 110* 104*         IMAGING x48h  - image(s) personally visualized  -   highlighted in bold Dg Chest 1 View  Result Date: 05/21/2019 CLINICAL DATA:  Status post right thoracentesis EXAM: CHEST  1 VIEW COMPARISON:  05/20/2019 CT FINDINGS: Cardiac shadow is enlarged but stable. Mild atelectasis is noted in the right mid and lower lung. No pneumothorax is noted following thoracentesis. No bony abnormality is seen. IMPRESSION: No pneumothorax following right-sided thoracentesis. Mild atelectatic changes on the right. Electronically Signed   By: Inez Catalina M.D.   On: 05/21/2019 14:30   Ct Chest Wo Contrast  Result Date: 05/20/2019 CLINICAL DATA:  Unresolved pneumonia EXAM: CT CHEST WITHOUT CONTRAST TECHNIQUE: Multidetector CT imaging of the chest was performed following the standard protocol without IV contrast. COMPARISON:  03/19/2019 FINDINGS: Cardiovascular: Atherosclerotic calcifications of the aortic arch, great vessels, and coronary arteries are noted. No evidence of aortic aneurysm. Mediastinum/Nodes: No abnormal mediastinal adenopathy. Visualized thyroid is unremarkable. Small pericardial effusion is new. Lungs/Pleura: Left lower lobe consolidation and left pleural effusion are improved. Right lower lobe consolidation has markedly worsened. A moderate right pleural effusion has worsened. There is also patchy airspace disease throughout the right  upper lobe. Subsegmental atelectasis in the right middle lobe. No pneumothorax. Centrilobular emphysema towards the lung apices. Upper Abdomen: No acute abnormality. Musculoskeletal: No vertebral compression deformity. IMPRESSION: Marked worsening consolidation in the right upper and lower lobes. Improved left lower lobe consolidation and left pleural effusion Worsening right pleural effusion which is moderate. New small pericardial effusion. Aortic Atherosclerosis (ICD10-I70.0) and Emphysema (ICD10-J43.9). Electronically Signed   By: Marybelle Killings M.D.   On: 05/20/2019 13:18   Dg Swallowing Func-speech Pathology  Result Date: 05/21/2019 Objective Swallowing Evaluation: Type of Study: MBS-Modified Barium Swallow Study  Patient Details Name: QUANTARIUS GENRICH MRN: [id number] Date of Birth: August 07, 1933 Today's Date: 05/21/2019 Time: SLP Start Time (ACUTE ONLY): 1300 -SLP Stop Time (ACUTE ONLY): 1315 SLP Time Calculation (min) (ACUTE ONLY): 15 min Past Medical History: Past Medical History: Diagnosis Date  Alcohol abuse   Colon cancer ([place])   COPD (chronic obstructive pulmonary disease) ([place])   Emphysema of lung ([place])   Former tobacco use   Hypertension   Peripheral edema   Sepsis ([place]) 10/2016  Aspiration PNA/C diff colitis Past Surgical History: Past Surgical History: Procedure Laterality Date  CARDIOVERSION N/A 03/16/2019  Procedure: CARDIOVERSION;  Surgeon: Pixie Casino, MD;  Location: [facility] ENDOSCOPY;  Service: Cardiovascular;  Laterality: N/A;  CARDIOVERSION N/A 03/23/2019  Procedure: CARDIOVERSION;  Surgeon: Lelon Perla, MD;  Location[facility] ENDOSCOPY;  Service: Cardiovascular;  Laterality: N/A;  CATARACT EXTRACTION    COLON RESECTION    COLONOSCOPY    IMPLANTATION BONE ANCHORED HEARING AID Left 2016  MINOR HEMORRHOIDECTOMY  1995  TEE WITHOUT CARDIOVERSION N/A 03/16/2019  Procedure: TRANSESOPHAGEAL ECHOCARDIOGRAM (TEE);  Surgeon: Pixie Casino, MD;  Location: [facility] ENDOSCOPY;  Service:  Cardiovascular;  Laterality: N/A;  TONSILLECTOMY   HPI: Patient is an 85 y.o. male with PMH:  COPD: oxygen dependent on 4L at home, atrial fibrillation on Xarelto, diastolic heart failure, cardiomyopathy, chronic kidney disease, colon cancer, ETOH abuse, who also had a prolonged hospitalization from March to April for septic shock. He arrived at the hospital on 5/30 with mid-sternal chest pain radiating to the neck and 2 week h/o worsening breathing. CXR revealed RLL collapse, mediastinal shift to the right, stable opacities in right lung, increasing opacities in LL base.  Repeat CXR revealed small to moderate right pleural effusion and trace left pleural effusion.  Subjective: pleasant, no c/o swallow problems Assessment / Plan / Recommendation CHL IP CLINICAL IMPRESSIONS 05/21/2019 Clinical Impression Patient presents with an oropharyngeal swallow that is WFL-WNL without aspiration or penetration even when taking large volume sips of thin liquids via cup and straw sips. He exhbited a trace amount of vallecular residuals, but these fully cleared with spontaneous swallowing.  SLP Visit Diagnosis Dysphagia, unspecified (R13.10) Attention and concentration deficit following -- Frontal lobe and executive function deficit following -- Impact on safety and function No limitations   CHL IP TREATMENT RECOMMENDATION 05/21/2019 Treatment Recommendations No treatment recommended at this time   Prognosis 05/21/2019 Prognosis for Safe Diet Advancement Good Barriers to Reach Goals -- Barriers/Prognosis Comment -- CHL IP DIET RECOMMENDATION 05/21/2019 SLP Diet Recommendations Regular solids;Thin liquid Liquid Administration via Cup;Straw Medication Administration Whole meds with liquid Compensations -- Postural Changes --   No flowsheet data found.  CHL IP FOLLOW UP RECOMMENDATIONS 05/21/2019 Follow up Recommendations None   No flowsheet data found.     CHL IP ORAL PHASE 05/21/2019 Oral Phase WFL Oral - Pudding Teaspoon -- Oral - Pudding Cup  -- Oral - Honey Teaspoon -- Oral - Honey Cup -- Oral - Nectar Teaspoon -- Oral - Nectar Cup -- Oral - Nectar Straw -- Oral - Thin Teaspoon -- Oral - Thin Cup -- Oral - Thin Straw -- Oral - Puree -- Oral - Mech Soft -- Oral - Regular -- Oral - Multi-Consistency -- Oral - Pill -- Oral Phase - Comment --  CHL IP PHARYNGEAL PHASE 05/21/2019 Pharyngeal Phase WFL Pharyngeal- Pudding Teaspoon -- Pharyngeal -- Pharyngeal- Pudding Cup -- Pharyngeal -- Pharyngeal- Honey Teaspoon -- Pharyngeal -- Pharyngeal- Honey Cup -- Pharyngeal -- Pharyngeal- Nectar Teaspoon -- Pharyngeal -- Pharyngeal- Nectar Cup -- Pharyngeal -- Pharyngeal- Nectar Straw -- Pharyngeal -- Pharyngeal- Thin Teaspoon -- Pharyngeal -- Pharyngeal- Thin Cup -- Pharyngeal -- Pharyngeal- Thin Straw -- Pharyngeal -- Pharyngeal- Puree -- Pharyngeal -- Pharyngeal- Mechanical Soft -- Pharyngeal -- Pharyngeal- Regular -- Pharyngeal -- Pharyngeal- Multi-consistency -- Pharyngeal -- Pharyngeal- Pill -- Pharyngeal -- Pharyngeal Comment --  CHL IP CERVICAL ESOPHAGEAL PHASE 05/21/2019 Cervical Esophageal Phase WFL Pudding Teaspoon -- Pudding Cup -- Honey Teaspoon -- Honey Cup -- Nectar Teaspoon -- Nectar Cup -- Nectar Straw -- Thin Teaspoon -- Thin Cup -- Thin Straw -- Puree -- Mechanical Soft -- Regular -- Multi-consistency -- Pill -- Cervical Esophageal Comment -- [name] 05/21/2019, 1:47 PM Sonia Baller, MA, CCC-SLP Speech Therapy [facility] Acute Rehab Pager: [phone]              Ir Thoracentesis Asp Pleural Space W/img Guide  Result Date: 05/21/2019 INDICATION: Patient with history of COPD, emphysema, acute on chronic hypoxic and hypercapnic respiratory failure, and right pleural effusion. Request is made for diagnostic and therapeutic right thoracentesis. EXAM: ULTRASOUND GUIDED DIAGNOSTIC AND THERAPEUTIC RIGHT THORACENTESIS MEDICATIONS: 10 mL 1% lidocaine COMPLICATIONS: None immediate. PROCEDURE: An ultrasound guided thoracentesis was thoroughly discussed with  the patient and questions answered. The benefits, risks, alternatives and complications were also discussed. The patient understands and wishes to proceed with the procedure. Written consent was obtained. Ultrasound was performed to localize and mark an adequate pocket of fluid in the right chest. The area was then prepped and draped in the normal sterile fashion. 1% Lidocaine was used for local anesthesia. Under ultrasound guidance a 6 Fr Safe-T-Centesis catheter was introduced. Thoracentesis was performed. The catheter was removed and a dressing applied. FINDINGS: A total of approximately 1.85 L of dark red fluid was removed. Samples were sent to the laboratory as requested by the clinical team. IMPRESSION: Successful ultrasound guided right thoracentesis yielding 1.85 L of pleural fluid. Read by: Earley Abide, PA-C Electronically Signed   By: Jerilynn Mages.  Shick M.D.   On: 05/21/2019 14:31    Resolved Hospital Problem list     Assessment & Plan:  #baseline copd - gold stage 2 with 3-4L [place] at home # Baseline - undiagnosed High pre test prob osa (desats with hx of snoring) #baseline - chronic amio Rx  #Current - Acute on chronic hypoxic and hypercapnic respiratory failure - on ct 5/31 -  new Rt sidded consolidation with rt moderate effusion - with high PCT - culture negative - suspect HCAP MBS negative 05/21/2019 suggesting against aspiration as cause    6/2 -  Improving slowly but again non-cooperative with biPAP QHS . Sp thora 05/21/2019 - results pending   Plan  Goal pulse ox > 88% (not 100%)  BIPAP support  - qhs x 4h - he cannot do more because of vertigo but has agreed to do 4h - emphasized again the importance of this  Daily incentive spirometry  Await thora cytology from 05/21/2019 .  Continue empiric abx with maxipime  (total 7 day course)  Duoneb q4h (was prn) - due to wheeze  Pulmicort nebs start 05/21/2199  Await AchRab 05/23/19   CXR Port 05/23/19   Check ABG to determine home  bipap     CKD -followed by renal OP setting  - Baseline CKD - creat 2.5 - 44m% in 2020  6/1 - sudden rise in creat and 05/22/2019 - still up 3.7 despite hold of lasix  Plan  Strict I/O  Monitor and replace electrolytes  Avoid nephrotoxins (dc vanc) Need renal consult - triad to decide    A FIb  DCHF -echo 02/2019 EF 60-65% BNP elevated at 443   6/1 -Amio toxicity low suspicion  Though ESR > 80+. ECHO ef 45% ad new s-CHF   Plan  Continue home cardizem, and bisoprolol   IV HEPARIN in lieu of home oral anticoagulation - ok to continue post thora (no evidence of bleed() Continue Amiodarone  Hold lasix    Anemia  Hbg stable   Plan  - PRBC for hgb </= 6.9gm%    - exceptions are   -  if ACS susepcted/confirmed then transfuse for hgb </= 8.0gm%,  or    -  active bleeding with hemodynamic instability, then transfuse regardless of hemoglobin value   At at all times try to transfuse 1 unit prbc as possible with exception of active hemorrhage    Physical Deconditioning  - severely deconditioned  -likely needs SNF rehab or inpatient rehab    Best practice:  Diet:  Heart healthy diet Pain/Anxiety/Delirium protocol (if indicated): na  VAP protocol (if indicated): na DVT prophylaxis:  IV heparin for A Fib GI prophylaxis: na  Glucose control: na Mobility: oob to  chair Code Status: DNR but full medical care.  05/19/19 > Discussion with patient regarding code status , previously DNR. Patient confirms he wants a DNR status.   Family Communication: patient updated. Wife updated 5/31-  - she feels discharge in sprin 2020 to home was premature because of the covid outbreak. Came home too deconditioned and feel that is why he bounced back.  Updated Dr Wynelle Cleveland 05/21/2019 and 05/22/2019 - she will update wife    Disposition: move to progressive unit . Jeff primary 05/21/19 . CCM might see one more time and sign off. Currently uissue of deconditioning, HCAP, pleural effusion, s-CHF, CKD -  worse (cardio renal syndrome) all coming to a head. He needs to wear BiPAP QHS, get  Inpatient rehab  Future Appointments  Date Time Provider [place]  05/31/2019  9:00 AM [facility] ROOM 4 [facility] None  06/05/2019 10:30 AM Tanda Rockers, MD [facility] None  06/06/2019 10:15 AM Lorretta Harp, MD [facility]     SIGNATURE    Dr. Brand Males, M.D., F.C.C.P,  Pulmonary and Critical Care Medicine Staff Physician, [place] Director - Interstitial Lung Disease  Program  Pulmonary [place] [facility] [place], [place], [address]  Pager: [phone], If no answer or between  15:00h - 7:00h: call [phone] Telephone: [phone]  9:12 AM 05/22/2019

## 2019-05-22 NOTE — Progress Notes (Signed)
Rehab Admissions Coordinator Note:  Per PT recommendation, this patient was screened by Jhonnie Garner for appropriateness for an Inpatient Acute Rehab Consult.  At this time, we are recommending Inpatient Rehab consult. If pt would like to be considered for CIR, he will also need an OT order. AC will contact MD to request orders.   Jhonnie Garner 05/22/2019, 5:46 PM  I can be reached at [phone].

## 2019-05-22 NOTE — Progress Notes (Addendum)
ANTICOAGULATION CONSULT NOTE   Pharmacy Consult for heparin Indication: atrial fibrillation  Patient Measurements: Height: 5\' 10"  (177.8 cm) Weight: 226 lb 10.1 oz (102.8 kg) IBW/kg (Calculated) : 73 Heparin Dosing Weight: 93.8kg  Vital Signs: Temp: 97.4 F (36.3 C) (06/02 1557) Temp Source: Oral (06/02 1557) BP: 98/57 (06/02 1800) Pulse Rate: 94 (06/02 1800)  Labs: Recent Labs    05/20/19 0301  05/21/19 0524 05/22/19 0307 05/22/19 1923  HGB 9.2*  --  8.3* 8.1*  --   HCT 31.1*  --  27.6* 27.6*  --   PLT 204  --  190 198  --   APTT 155*   < > 75* 61* 71*  HEPARINUNFRC >2.20*  --  1.66* 1.03*  --   CREATININE 2.93*  --  3.70* 3.71*  --   TROPONINI  --   --  <0.03  --   --    < > = values in this interval not displayed.    Estimated Creatinine Clearance: 18.1 mL/min (A) (by C-G formula based on SCr of 3.71 mg/dL (H)).  Assessment: 45 yom presented to the ED with CP. He is on chronic xarelto for history of afib which was transitioned to IV heparin.   Following aptt as hep lvl still affected by PTA xarelto  aPTT 71 - therapeutic.  Dark red output noted earlier today from chest tube post thoracentesis - discussed with Dr. Chase Caller who feels this was more inflammation output, not frank bleeding and ok to continue heparin and increase as needed. Has now resolved and no bleeding noted per RN.   Goal of Therapy:  Heparin level 0.3-0.7 units/ml aPTT 66-102 seconds Monitor platelets by anticoagulation protocol: Yes   Plan:  Continue heparin gtt at 1200 units/hr Recheck aPTT and heparin level with AM labs.  Daily heparin level, aPTT and CBC  Vertis Kelch, PharmD PGY1 Pharmacy Resident Phone [phone] 05/22/2019       7:49 PM

## 2019-05-22 NOTE — Telephone Encounter (Signed)
Called spoke with patient's spouse Archie Patten and informed her of Dr Gustavus Bryant encouragement Archie Patten was happy to hear and very grateful Nothing further needed at this time; will sign off

## 2019-05-22 NOTE — Telephone Encounter (Signed)
Aware, Ramaswamy seeing him there, so in good hands

## 2019-05-23 ENCOUNTER — Inpatient Hospital Stay (HOSPITAL_COMMUNITY): Payer: Medicare Other

## 2019-05-23 DIAGNOSIS — J9611 Chronic respiratory failure with hypoxia: Secondary | ICD-10-CM

## 2019-05-23 DIAGNOSIS — J189 Pneumonia, unspecified organism: Principal | ICD-10-CM

## 2019-05-23 LAB — BASIC METABOLIC PANEL
Anion gap: 9 (ref 5–15)
BUN: 75 mg/dL — ABNORMAL HIGH (ref 8–23)
CO2: 32 mmol/L (ref 22–32)
Calcium: 8.9 mg/dL (ref 8.9–10.3)
Chloride: 99 mmol/L (ref 98–111)
Creatinine, Ser: 3.41 mg/dL — ABNORMAL HIGH (ref 0.61–1.24)
GFR calc Af Amer: 18 mL/min — ABNORMAL LOW (ref >60)
GFR calc non Af Amer: 16 mL/min — ABNORMAL LOW (ref >60)
Glucose, Bld: 98 mg/dL (ref 70–99)
Potassium: 4.3 mmol/L (ref 3.5–5.1)
Sodium: 140 mmol/L (ref 135–145)

## 2019-05-23 LAB — PHOSPHORUS: Phosphorus: 3.1 mg/dL (ref 2.5–4.6)

## 2019-05-23 LAB — CBC
HCT: 26.4 % — ABNORMAL LOW (ref 39.0–52.0)
Hemoglobin: 7.9 g/dL — ABNORMAL LOW (ref 13.0–17.0)
MCH: 31.9 pg (ref 26.0–34.0)
MCHC: 29.9 g/dL — ABNORMAL LOW (ref 30.0–36.0)
MCV: 106.5 fL — ABNORMAL HIGH (ref 80.0–100.0)
Platelets: 197 10*3/uL (ref 150–400)
RBC: 2.48 MIL/uL — ABNORMAL LOW (ref 4.22–5.81)
RDW: 16.3 % — ABNORMAL HIGH (ref 11.5–15.5)
WBC: 7.4 10*3/uL (ref 4.0–10.5)
nRBC: 0 % (ref 0.0–0.2)

## 2019-05-23 LAB — POCT I-STAT 7, (LYTES, BLD GAS, ICA,H+H)
Acid-Base Excess: 7 mmol/L — ABNORMAL HIGH (ref 0.0–2.0)
Bicarbonate: 32.2 mmol/L — ABNORMAL HIGH (ref 20.0–28.0)
Calcium, Ion: 1.24 mmol/L (ref 1.15–1.40)
HCT: 23 % — ABNORMAL LOW (ref 39.0–52.0)
Hemoglobin: 7.8 g/dL — ABNORMAL LOW (ref 13.0–17.0)
O2 Saturation: 89 %
Patient temperature: 98.1
Potassium: 4.3 mmol/L (ref 3.5–5.1)
Sodium: 139 mmol/L (ref 135–145)
TCO2: 34 mmol/L — ABNORMAL HIGH (ref 22–32)
pCO2 arterial: 49.8 mmHg — ABNORMAL HIGH (ref 32.0–48.0)
pH, Arterial: 7.417 (ref 7.350–7.450)
pO2, Arterial: 56 mmHg — ABNORMAL LOW (ref 83.0–108.0)

## 2019-05-23 LAB — PH, BODY FLUID: pH, Body Fluid: 7.5

## 2019-05-23 LAB — MAGNESIUM: Magnesium: 2.3 mg/dL (ref 1.7–2.4)

## 2019-05-23 LAB — HEPARIN LEVEL (UNFRACTIONATED): Heparin Unfractionated: 0.57 IU/mL (ref 0.30–0.70)

## 2019-05-23 LAB — APTT: aPTT: 71 seconds — ABNORMAL HIGH (ref 24–36)

## 2019-05-23 NOTE — Progress Notes (Signed)
ANTICOAGULATION CONSULT NOTE   Pharmacy Consult for heparin Indication: atrial fibrillation  Patient Measurements: Height: 5\' 10"  (177.8 cm) Weight: 226 lb 3.1 oz (102.6 kg) IBW/kg (Calculated) : 73 Heparin Dosing Weight: 93.8kg  Vital Signs: Temp: 98.1 F (36.7 C) (06/03 0726) Temp Source: Oral (06/03 0726) BP: 118/70 (06/03 0800) Pulse Rate: 100 (06/03 0900)  Labs: Recent Labs    05/21/19 0524 05/22/19 0307 05/22/19 1923 05/23/19 0330 05/23/19 0341  HGB 8.3* 8.1*  --  7.9* 7.8*  HCT 27.6* 27.6*  --  26.4* 23.0*  PLT 190 198  --  197  --   APTT 75* 61* 71* 71*  --   HEPARINUNFRC 1.66* 1.03*  --  0.57  --   CREATININE 3.70* 3.71*  --  3.41*  --   TROPONINI <0.03  --   --   --   --     Estimated Creatinine Clearance: 19.7 mL/min (A) (by C-G formula based on SCr of 3.41 mg/dL (H)).  Assessment: 89 yom presented to the ED with CP. He is on chronic xarelto for history of afib which was transitioned to IV heparin.   APTT 71 - therapeutic. Heparin level 0.57 -now therapeutic and starting to correlate. Likely will be able to use Heparin levels going forward.  Dark red output noted from chest tube post thoracentesis on 6/2- discussed with Dr. Chase Caller who feels this is more inflammation output, not frank bleeding and ok to continue heparin and increase as needed.    Goal of Therapy:  Heparin level 0.3-0.7 units/ml aPTT 66-102 seconds Monitor platelets by anticoagulation protocol: Yes   Plan:  Continue heparin gtt at 1200 units/hr Daily heparin level, aPTT and CBC   Sloan Leiter, PharmD, BCPS, BCCCP Clinical Pharmacist Clinical phone 05/23/2019 until 3:30PM - #[phone]  Please check AMION for all [place] numbers 05/23/2019 10:33 AM

## 2019-05-23 NOTE — Consult Note (Addendum)
Cardiology Consultation:   Patient ID: David Barton MRN: [id number]; DOB: 03/02/1933  Admit date: 05/19/2019 Date of Consult: 05/23/2019  Primary Care Provider: Maury Dus, MD Primary Cardiologist: Quay Burow, MD  Primary Electrophysiologist:  None    Patient Profile:   David Barton is a 85 y.o. male with a hx of COPD/ emphysema, chronic hypoxic respiratory failure on home O2 (4L/min baseline), CKD, h/o NICM in 2017 in the setting of sepsis, w/ subsequent normalization of EF in 2018, recent admission for CAP and septic shock 02/2019 w/ hospitalization complicated by new onset atrial fibrillation w/ failed DCCV x 2, now on AAD therapy w/ amiodarone and anticoagulation w/ Xarelto, readmitted for acute on chronic hypoxic respiratory failure/ recurrent PNA, who is being seen today for the evaluation of systolic HF/ newly reduced LVF, at the request of Dr. Algis Liming, Internal Medicine.   History of Present Illness:   Pt is a 85 y/o male, former smoker, with moderate underlying COPD/ emphysema and chronic hypoxic respiratory failure, on home 02 (4L/min baseline). Other medical problems include h/o HTN, difficult to control persistent atrial fibrillation, h/o colon cancer and ETOH use. No known CAD. He has not had any prior ischemic evaluations. Patient has a history of prior cardiomyopathy in the setting of sepsis in Nov 2017. EF at that time was 30-35%. This subsequently recovered. F/u echo 01/2017 showed normalization of EF at 55-60%. He has been followed in clinic by Dr. Gwenlyn Found.   He was recently admitted to the hospital March 04, 2019 with acute respiratory failure felt to be secondary to community-acquired pneumonia and septic shock and he was treated w/ antibotics.  During his hospitalization, he had new onset atrial fibrillation with rapid ventricular response, acute diastolic heart failure, and acute on chronic renal insufficiency.  The patient had 2 attempted cardioversions, both  failed (3/27 and again 4/3).  He was placed on amiodarone loading with plans for another 3rd attempt at cardioversion in 8 to 12 weeks.  Xarelto at renal dose was also added.  TEE done March 27 showed his ejection fraction to be 60 to 65% with mild left atrial enlargement.  His serum creatinine on admission was 4.6, at discharge 3.14. He was discharged home on 03/26/19. Apparently, he had f/u with PCP and had repeat CXR 4/21 that showed airspace and interstitial densities in the upper lungs, particularly in the right lung. Findings were most compatible with pneumonia. Dr Alyson Ingles started him on OP antibiotics. He had a telehealth visit w/ Kerin Ransom, PA-C, on 4/28, and was symptomatically doing well. By that time, his amiodarone had been titrated down to 200 mg daily and his HR was noted to be in the 80s at time of telehealth visit. Per clinic note, plan was to bring back for office visit with Dr. Gwenlyn Found for repeat EKG in 2 months and plan for repeat cardioversion if still in afib.  On 05/19/19, pt presented to the [facility] ED w/ CC of worsening dyspnea and was found to be hypoxic with increased oxygen demand. He was hypoxemic on 15L O2 with SpO2 raning from high 70s to low 80s and started on Bipap. CXR showed bilateral airspace disease right greater than left with right lower lobe collapse and right pleural effusion. EKG atrial flutter low 100s. Lactic acid 1.4.  Troponin 0.03.  BNP is 465, creatinine 2.86 (baseline 2.5), potassium 4.4.,  COVID-19 SARS NEG.  Xarelto was held and pt placed on IV heparin, in anticipation for possible bronchoscopy and or thoracentesis  if worsening respiratory function. His amiodarone and Zebta continued. Cardizem held. He was treated w/ IV Lasix and IV antibiotics. Pt also made DNR.  On 5/31, he had a chest CT for unresolved PNA. This showed worsening consolidation in the RUL and RLL, improved LLL consolidation and left pleural effusion. Scan demonstrated worsening right pleural effusion which  was noted to be moderate. A new small pericardial effusion was also noted.   On 6/1, he underwent ultrasound guided diagnostic and therapeutic right thoracentesis. A total of approximately 1.85 L of dark red fluid was removed. Samples were sent to the laboratory as requested by the clinical team. Fluid analysis revealed 1463 WBC, 82 % Neutrophils, no bacteria on gram stain, LDH 140, Glucose 136. F/u CXR on 6/2 showed improvement.   He also had a repeat 2D Echo 05/21/19 that showed slight, reduction in EF, down to 45-50% with hypokinesis of the inferolateral, inferoseptal and infero-apical myocardium.  Cardiology asked to evaluate. His SCr today is 3.41. Hgb 7.8. BP soft in the 35K systolic.     Past Medical History:  Diagnosis Date   Alcohol abuse    Colon cancer ([place])    COPD (chronic obstructive pulmonary disease) ([place])    Emphysema of lung ([place])    Former tobacco use    Hypertension    Peripheral edema    Sepsis ([place]) 10/2016   Aspiration PNA/C diff colitis    Past Surgical History:  Procedure Laterality Date   CARDIOVERSION N/A 03/16/2019   Procedure: CARDIOVERSION;  Surgeon: Pixie Casino, MD;  Location: [facility] ENDOSCOPY;  Service: Cardiovascular;  Laterality: N/A;   CARDIOVERSION N/A 03/23/2019   Procedure: CARDIOVERSION;  Surgeon: Lelon Perla, MD;  Location: [place];  Service: Cardiovascular;  Laterality: N/A;   CATARACT EXTRACTION     COLON RESECTION     COLONOSCOPY     IMPLANTATION BONE ANCHORED HEARING AID Left 2016   IR THORACENTESIS ASP PLEURAL SPACE W/IMG GUIDE  05/21/2019   MINOR HEMORRHOIDECTOMY  1995   TEE WITHOUT CARDIOVERSION N/A 03/16/2019   Procedure: TRANSESOPHAGEAL ECHOCARDIOGRAM (TEE);  Surgeon: Pixie Casino, MD;  Location: [facility] ENDOSCOPY;  Service: Cardiovascular;  Laterality: N/A;   TONSILLECTOMY       Home Medications:  Prior to Admission medications   Medication Sig Start Date End Date Taking? Authorizing Provider  allopurinol  (ZYLOPRIM) 100 MG tablet Take 100 mg by mouth daily. 02/17/19  Yes [provider]  amiodarone (PACERONE) 200 MG tablet Take 1 tablet (200 mg total) by mouth daily. 04/17/19  Yes Kilroy, Luke K, PA-C  bisoprolol (ZEBETA) 5 MG tablet Take 5 mg by mouth daily.   Yes [provider]  diltiazem (CARDIZEM CD) 120 MG 24 hr capsule Take 1 capsule (120 mg total) by mouth at bedtime. 04/23/19  Yes Lorretta Harp, MD  ferrous sulfate 325 (65 FE) MG tablet Take 325 mg by mouth daily.    Yes [provider]  furosemide (LASIX) 40 MG tablet Take 60 mg by mouth daily.    Yes [provider]  Multiple Vitamin (MULTIVITAMIN WITH MINERALS) TABS tablet Take 1 tablet by mouth daily.   Yes [provider]  OXYGEN Inhale 4 L into the lungs continuous.   Yes [provider]  Rivaroxaban (XARELTO) 15 MG TABS tablet Take 1 tablet (15 mg total) by mouth daily with supper. 04/23/19  Yes Lorretta Harp, MD  ipratropium-albuterol (DUONEB) 0.5-2.5 (3) MG/3ML SOLN Use twice a day scheduled and every 4  hours as needed for shortness of breath and wheezing Patient not taking: Reported on 05/19/2019 03/26/19   Thurnell Lose, MD    Inpatient Medications: Scheduled Meds:  allopurinol  100 mg Oral Daily   amiodarone  200 mg Oral Daily   bisacodyl  10 mg Oral Daily   bisoprolol  5 mg Oral Daily   budesonide (PULMICORT) nebulizer solution  0.25 mg Nebulization BID   Chlorhexidine Gluconate Cloth  6 each Topical Daily   ferrous sulfate  325 mg Oral Daily   sodium chloride flush  3 mL Intravenous Q12H   Continuous Infusions:  sodium chloride     ceFEPime (MAXIPIME) IV 2 g (05/23/19 1144)   [START ON 05/24/2019] ferumoxytol     heparin 1,200 Units/hr (05/23/19 1200)   PRN Meds: sodium chloride, ipratropium-albuterol, lidocaine, nitroGLYCERIN, ondansetron (ZOFRAN) IV, sodium chloride flush  Allergies:    Allergies  Allergen Reactions   Flexeril  [Cyclobenzaprine] Other (See Comments)    Unknown reaction     Social History:   Social History   Socioeconomic History   Marital status: Married    Spouse name: Not on file   Number of children: 3   Years of education: Not on file   Highest education level: Not on file  Occupational History   Occupation: Retired- Associate Professor  Social Designer, fashion/clothing strain: Not on file   Food insecurity:    Worry: Not on file    Inability: Not on file   Transportation needs:    Medical: Not on file    Non-medical: Not on file  Tobacco Use   Smoking status: Former Smoker    Packs/day: 1.00    Years: 20.00    Pack years: 20.00    Types: Cigarettes    Last attempt to quit: 12/20/1981    Years since quitting: 37.4   Smokeless tobacco: Never Used  Substance and Sexual Activity   Alcohol use: Yes    Alcohol/week: 7.0 standard drinks    Types: 7 Standard drinks or equivalent per week    Comment: states he drinks 3-4 days a week, "a couple" on those occasions but does not further quaniity.   Drug use: No   Sexual activity: Never  Lifestyle   Physical activity:    Days per week: Not on file    Minutes per session: Not on file   Stress: Not on file  Relationships   Social connections:    Talks on phone: Not on file    Gets together: Not on file    Attends religious service: Not on file    Active member of club or organization: Not on file    Attends meetings of clubs or organizations: Not on file    Relationship status: Not on file   Intimate partner violence:    Fear of current or ex partner: Not on file    Emotionally abused: Not on file    Physically abused: Not on file    Forced sexual activity: Not on file  Other Topics Concern   Not on file  Social History Narrative   Not on file    Family History:    Family History  Problem Relation Age of Onset   Brain cancer Father    Colon cancer Mother        with mets to liver   Lung  cancer Brother        was a smoker     ROS:  Please see the history of present illness.   All other ROS reviewed and negative.     Physical Exam/Data:   Vitals:   05/23/19 1100 05/23/19 1155 05/23/19 1200 05/23/19 1300  BP: (!) 128/95  113/65 (!) 99/54  Pulse: 95  98 98  Resp: (!) 21  19 (!) 25  Temp:  98 F (36.7 C)    TempSrc:  Oral    SpO2: 93%  95% 92%  Weight:      Height:        Intake/Output Summary (Last 24 hours) at 05/23/2019 1353 Last data filed at 05/23/2019 1200 Gross per 24 hour  Intake 793.04 ml  Output 1350 ml  Net -556.96 ml   Last 3 Weights 05/23/2019 05/21/2019 05/20/2019  Weight (lbs) 226 lb 3.1 oz 226 lb 10.1 oz 225 lb 12 oz  Weight (kg) 102.6 kg 102.8 kg 102.4 kg     Body mass index is 32.46 kg/m.  General:  Elderly WM in no acute distress HEENT: normal Lymph: no adenopathy Neck: no JVD Endocrine:  No thryomegaly Vascular: No carotid bruits; FA pulses 2+ bilaterally without bruits  Cardiac:  normal S1, S2; RRR; no murmur  Lungs:  clear to auscultation bilaterally, no wheezing, rhonchi or rales  Abd: soft, nontender, no hepatomegaly  Ext: no edema Musculoskeletal:  No deformities, BUE and BLE strength normal and equal Skin: warm and dry  Neuro:  CNs 2-12 intact, no focal abnormalities noted Psych:  Normal affect   EKG:  The EKG was personally reviewed and demonstrates:  5/30 and 6/1, very poor quality EKGs, ? Atrial flutter, low 100s.  Telemetry:  Telemetry was personally reviewed and demonstrates:  Atrial flutter 93 bpm  Relevant CV Studies: 2D Echo 03/07/19 IMPRESSIONS    1. The left ventricle has normal systolic function, with an ejection fraction of 55-60%. The cavity size was normal. Left ventricular diastolic function could not be evaluated secondary to atrial fibrillation.  2. The right ventricle has normal systolic function. The cavity was normal. There is no increase in right ventricular wall thickness.  3. Right atrial size was  mildly dilated.  4. The mitral valve is grossly normal. Mild thickening of the mitral valve leaflet.  5. The aortic valve is tricuspid Mild thickening of the aortic valve Mild calcification of the aortic valve. no stenosis of the aortic valve.  6. Incomplete echo.  7. Technically difficult images.       8. The interatrial septum was not assessed.  2D Echo 05/21/19 IMPRESSIONS    1. The left ventricle has mildly reduced systolic function, with an ejection fraction of 45-50%. The cavity size was normal. There is mildly increased left ventricular wall thickness. Left ventricular diastolic Doppler parameters are indeterminate.  2. LV endocardium difficult to see (No Definity used). EF appears to be ~45-50% with hypokinesis of the inferolateral, inferoseptal and infero-apical myocardium. Consider repeat imaging with Definity to better assess.  3. Left atrial size was moderately dilated.  4. Right atrial size was moderately dilated.  5. Small pericardial effusion.  6. The pericardial effusion is posterior to the left ventricle and circumferential.  7. Mild calcification of the mitral valve leaflet.  8. The aortic valve is tricuspid. Moderate calcification of the aortic valve. No stenosis of the aortic valve.  9. The aortic root and ascending aorta are normal in size and structure. 10. The inferior vena cava was dilated in size with <50% respiratory variability. 11. The interatrial septum was not well visualized.  Laboratory Data:  Chemistry Recent Labs  Lab 05/21/19 0524 05/22/19 0307 05/23/19 0330 05/23/19 0341  NA 137 140 140 139  K 4.5 4.5 4.3 4.3  CL 96* 98 99  --   CO2 30 30 32  --   GLUCOSE 122* 111* 98  --   BUN 75* 78* 75*  --   CREATININE 3.70* 3.71* 3.41*  --   CALCIUM 8.6* 8.7* 8.9  --   GFRNONAA 14* 14* 16*  --   GFRAA 17* 16* 18*  --   ANIONGAP 11 12 9   --     Recent Labs  Lab 05/21/19 0524  PROT 6.0*  ALBUMIN 2.3*  AST 14*  ALT 10  ALKPHOS 50  BILITOT  0.5   Hematology Recent Labs  Lab 05/21/19 0524 05/22/19 0307 05/23/19 0330 05/23/19 0341  WBC 11.9* 9.7 7.4  --   RBC 2.57* 2.57* 2.48*  --   HGB 8.3* 8.1* 7.9* 7.8*  HCT 27.6* 27.6* 26.4* 23.0*  MCV 107.4* 107.4* 106.5*  --   MCH 32.3 31.5 31.9  --   MCHC 30.1 29.3* 29.9*  --   RDW 16.4* 16.4* 16.3*  --   PLT 190 198 197  --    Cardiac Enzymes Recent Labs  Lab 05/19/19 0901 05/21/19 0524  TROPONINI <0.03 <0.03   No results for input(s): TROPIPOC in the last 168 hours.  BNP Recent Labs  Lab 05/19/19 0901  BNP 465.7*    DDimer No results for input(s): DDIMER in the last 168 hours.  Radiology/Studies:  Dg Chest 1 View  Result Date: 05/21/2019 CLINICAL DATA:  Status post right thoracentesis EXAM: CHEST  1 VIEW COMPARISON:  05/20/2019 CT FINDINGS: Cardiac shadow is enlarged but stable. Mild atelectasis is noted in the right mid and lower lung. No pneumothorax is noted following thoracentesis. No bony abnormality is seen. IMPRESSION: No pneumothorax following right-sided thoracentesis. Mild atelectatic changes on the right. Electronically Signed   By: Inez Catalina M.D.   On: 05/21/2019 14:30   Ct Chest Wo Contrast  Result Date: 05/20/2019 CLINICAL DATA:  Unresolved pneumonia EXAM: CT CHEST WITHOUT CONTRAST TECHNIQUE: Multidetector CT imaging of the chest was performed following the standard protocol without IV contrast. COMPARISON:  03/19/2019 FINDINGS: Cardiovascular: Atherosclerotic calcifications of the aortic arch, great vessels, and coronary arteries are noted. No evidence of aortic aneurysm. Mediastinum/Nodes: No abnormal mediastinal adenopathy. Visualized thyroid is unremarkable. Small pericardial effusion is new. Lungs/Pleura: Left lower lobe consolidation and left pleural effusion are improved. Right lower lobe consolidation has markedly worsened. A moderate right pleural effusion has worsened. There is also patchy airspace disease throughout the right upper lobe.  Subsegmental atelectasis in the right middle lobe. No pneumothorax. Centrilobular emphysema towards the lung apices. Upper Abdomen: No acute abnormality. Musculoskeletal: No vertebral compression deformity. IMPRESSION: Marked worsening consolidation in the right upper and lower lobes. Improved left lower lobe consolidation and left pleural effusion Worsening right pleural effusion which is moderate. New small pericardial effusion. Aortic Atherosclerosis (ICD10-I70.0) and Emphysema (ICD10-J43.9). Electronically Signed   By: Marybelle Killings M.D.   On: 05/20/2019 13:18   Dg Chest Port 1 View  Result Date: 05/23/2019 CLINICAL DATA:  Right-sided pleural effusion EXAM: PORTABLE CHEST 1 VIEW COMPARISON:  05/21/2019 FINDINGS: Cardiac shadow is stable. Thickening is noted along the minor fissure on the right stable from the prior exam. Mild right basilar atelectasis is seen. No new focal abnormality is noted. No pneumothorax is seen. IMPRESSION: Stable changes on the right. No  pneumothorax or sizable effusion is seen. Electronically Signed   By: Inez Catalina M.D.   On: 05/23/2019 07:54   Dg Chest Port 1 View  Result Date: 05/20/2019 CLINICAL DATA:  Pneumonia EXAM: PORTABLE CHEST 1 VIEW COMPARISON:  05/19/2019 FINDINGS: Small to moderate right pleural effusion, similar. Associated right lower lobe opacity, likely atelectasis. Trace left pleural effusion. Left lung is otherwise clear. No pneumothorax is seen. Cardiomegaly. IMPRESSION: Small to moderate right pleural effusion and trace left pleural effusion, grossly unchanged. Associated right lower lobe opacity, likely atelectasis. Electronically Signed   By: Julian Hy M.D.   On: 05/20/2019 05:05   Dg Swallowing Func-speech Pathology  Result Date: 05/21/2019 Objective Swallowing Evaluation: Type of Study: MBS-Modified Barium Swallow Study  Patient Details Name: David Barton MRN: [id number] Date of Birth: [birth year] Today's Date: 05/21/2019 Time: SLP Start Time  (ACUTE ONLY): 1300 -SLP Stop Time (ACUTE ONLY): 1315 SLP Time Calculation (min) (ACUTE ONLY): 15 min Past Medical History: Past Medical History: Diagnosis Date  Alcohol abuse   Colon cancer ([place])   COPD (chronic obstructive pulmonary disease) ([place])   Emphysema of lung ([place])   Former tobacco use   Hypertension   Peripheral edema   Sepsis ([place]) 10/2016  Aspiration PNA/C diff colitis Past Surgical History: Past Surgical History: Procedure Laterality Date  CARDIOVERSION N/A 03/16/2019  Procedure: CARDIOVERSION;  Surgeon: Pixie Casino, MD;  Location: [facility] ENDOSCOPY;  Service: Cardiovascular;  Laterality: N/A;  CARDIOVERSION N/A 03/23/2019  Procedure: CARDIOVERSION;  Surgeon: Lelon Perla, MD;  Location: [facility] ENDOSCOPY;  Service: Cardiovascular;  Laterality: N/A;  CATARACT EXTRACTION    COLON RESECTION    COLONOSCOPY    IMPLANTATION BONE ANCHORED HEARING AID Left 2016  MINOR HEMORRHOIDECTOMY  1995  TEE WITHOUT CARDIOVERSION N/A 03/16/2019  Procedure: TRANSESOPHAGEAL ECHOCARDIOGRAM (TEE);  Surgeon: Pixie Casino, MD;  Location: [facility] ENDOSCOPY;  Service: Cardiovascular;  Laterality: N/A;  TONSILLECTOMY   HPI: Patient is an 85 y.o. male with PMH: COPD: oxygen dependent on 4L at home, atrial fibrillation on Xarelto, diastolic heart failure, cardiomyopathy, chronic kidney disease, colon cancer, ETOH abuse, who also had a prolonged hospitalization from March to April for septic shock. He arrived at the hospital on 5/30 with mid-sternal chest pain radiating to the neck and 2 week h/o worsening breathing. CXR revealed RLL collapse, mediastinal shift to the right, stable opacities in right lung, increasing opacities in LL base.  Repeat CXR revealed small to moderate right pleural effusion and trace left pleural effusion.  Subjective: pleasant, no c/o swallow problems Assessment / Plan / Recommendation CHL IP CLINICAL IMPRESSIONS 05/21/2019 Clinical Impression Patient presents with an oropharyngeal swallow that is  WFL-WNL without aspiration or penetration even when taking large volume sips of thin liquids via cup and straw sips. He exhbited a trace amount of vallecular residuals, but these fully cleared with spontaneous swallowing.  SLP Visit Diagnosis Dysphagia, unspecified (R13.10) Attention and concentration deficit following -- Frontal lobe and executive function deficit following -- Impact on safety and function No limitations   CHL IP TREATMENT RECOMMENDATION 05/21/2019 Treatment Recommendations No treatment recommended at this time   Prognosis 05/21/2019 Prognosis for Safe Diet Advancement Good Barriers to Reach Goals -- Barriers/Prognosis Comment -- CHL IP DIET RECOMMENDATION 05/21/2019 SLP Diet Recommendations Regular solids;Thin liquid Liquid Administration via Cup;Straw Medication Administration Whole meds with liquid Compensations -- Postural Changes --   No flowsheet data found.  CHL IP FOLLOW UP RECOMMENDATIONS 05/21/2019 Follow up Recommendations None   No flowsheet data  found.     CHL IP ORAL PHASE 05/21/2019 Oral Phase WFL Oral - Pudding Teaspoon -- Oral - Pudding Cup -- Oral - Honey Teaspoon -- Oral - Honey Cup -- Oral - Nectar Teaspoon -- Oral - Nectar Cup -- Oral - Nectar Straw -- Oral - Thin Teaspoon -- Oral - Thin Cup -- Oral - Thin Straw -- Oral - Puree -- Oral - Mech Soft -- Oral - Regular -- Oral - Multi-Consistency -- Oral - Pill -- Oral Phase - Comment --  CHL IP PHARYNGEAL PHASE 05/21/2019 Pharyngeal Phase WFL Pharyngeal- Pudding Teaspoon -- Pharyngeal -- Pharyngeal- Pudding Cup -- Pharyngeal -- Pharyngeal- Honey Teaspoon -- Pharyngeal -- Pharyngeal- Honey Cup -- Pharyngeal -- Pharyngeal- Nectar Teaspoon -- Pharyngeal -- Pharyngeal- Nectar Cup -- Pharyngeal -- Pharyngeal- Nectar Straw -- Pharyngeal -- Pharyngeal- Thin Teaspoon -- Pharyngeal -- Pharyngeal- Thin Cup -- Pharyngeal -- Pharyngeal- Thin Straw -- Pharyngeal -- Pharyngeal- Puree -- Pharyngeal -- Pharyngeal- Mechanical Soft -- Pharyngeal -- Pharyngeal-  Regular -- Pharyngeal -- Pharyngeal- Multi-consistency -- Pharyngeal -- Pharyngeal- Pill -- Pharyngeal -- Pharyngeal Comment --  CHL IP CERVICAL ESOPHAGEAL PHASE 05/21/2019 Cervical Esophageal Phase WFL Pudding Teaspoon -- Pudding Cup -- Honey Teaspoon -- Honey Cup -- Nectar Teaspoon -- Nectar Cup -- Nectar Straw -- Thin Teaspoon -- Thin Cup -- Thin Straw -- Puree -- Mechanical Soft -- Regular -- Multi-consistency -- Pill -- Cervical Esophageal Comment -- [name] 05/21/2019, 1:47 PM Sonia Baller, MA, CCC-SLP Speech Therapy [facility] Acute Rehab Pager: [phone]              Ir Thoracentesis Asp Pleural Space W/img Guide  Result Date: 05/21/2019 INDICATION: Patient with history of COPD, emphysema, acute on chronic hypoxic and hypercapnic respiratory failure, and right pleural effusion. Request is made for diagnostic and therapeutic right thoracentesis. EXAM: ULTRASOUND GUIDED DIAGNOSTIC AND THERAPEUTIC RIGHT THORACENTESIS MEDICATIONS: 10 mL 1% lidocaine COMPLICATIONS: None immediate. PROCEDURE: An ultrasound guided thoracentesis was thoroughly discussed with the patient and questions answered. The benefits, risks, alternatives and complications were also discussed. The patient understands and wishes to proceed with the procedure. Written consent was obtained. Ultrasound was performed to localize and mark an adequate pocket of fluid in the right chest. The area was then prepped and draped in the normal sterile fashion. 1% Lidocaine was used for local anesthesia. Under ultrasound guidance a 6 Fr Safe-T-Centesis catheter was introduced. Thoracentesis was performed. The catheter was removed and a dressing applied. FINDINGS: A total of approximately 1.85 L of dark red fluid was removed. Samples were sent to the laboratory as requested by the clinical team. IMPRESSION: Successful ultrasound guided right thoracentesis yielding 1.85 L of pleural fluid. Read by: Earley Abide, PA-C Electronically Signed   By: Jerilynn Mages.   Shick M.D.   On: 05/21/2019 14:31    Assessment and Plan:   David Barton is a 85 y.o. male with a hx of COPD/ emphysema, chronic hypoxic respiratory failure on home O2 (4L/min baseline), CKD, h/o NICM in 2017 in the setting of sepsis, w/ subsequent normalization of EF in 2018, recent admission for CAP and septic shock 02/2019 w/ hospitalization complicated by new onset atrial fibrillation w/ failed DCCV x 2, now on AAD therapy w/ amiodarone and anticoagulation w/ Xarelto, readmitted for acute on chronic hypoxic respiratory failure/ recurrent PNA, who is being seen today for the evaluation of systolic HF/ newly reduced LVF, at the request of Dr. Algis Liming, Internal Medicine.   1. Systolic HF: newly reduced LVEF. He  does have a h/o NICM in 2017 in the setting of sepsis, w/ subsequent normalization of EF in 2018 (no ishcmeic w/u was conducted at that time). Echo last admit 02/2019 showed normal EF at 55-60%. EF now 45-50% with hypokinesis of the inferolateral, inferoseptal and infero-apical myocardium. These WMAs were not noted on prior echo in March. Troponins this admit have been negative. No recent CP. Reduced LVF may be 2/2 nonischemic causes, including tachy mediated atrial fibrillation/ flutter vs viral from PNA. However given WMAs, cannot w/o possible ischemic etiology. Given lack of CP and abnormal renal function w/ SCr at 3.41, would not persue cardiac cath due to risk of worsening renal function w/ contrast that may necessitate HD. Continue to hold home Cardizem given reduced LVF. Will continue to treat w/  blocker. Bisoprolol appropriate given COPD. Unable to use ACE/ARB due to CKD. BP too soft for BiDil. Monitor volume status closely.    2. Recurrent PNA: recurrent issue since March. Multiple rounds of antibiotics. Currently on cefepime. Concerns for possible aspiration. PCCM signed off. No plans for bronchoscopy.   3. Atrial Fibrillation/ Atrial Flutter: first diagnosis made 02/2019 while  admitted for CAP / septic shock. He failed DCCV x 2 (3/27 and 4/3). Plans were to load w/ amiodarone and reattempt cardioversion in 8-12 weeks. Despite amiodarone, he remains in atrial fib/ flutter but rates are decently controlled. Given underlying pulmonary disease, may have difficulties w/ rhythm control. He was on Xarelto prior to admit, but held on admit and placed on  IV heparin given interventions that were planned. S/p thoracentesis 6/1.   4. Acute on Chronic Hypoxic Respiratory Failure/ COPD: Tested negative for COVID on 5/30. Likely Multifactorial.  Recurrent PNA, COPD, moderate right sided pleural effusion s/p thoracentesis, CHF (BNP 465), persistent atrial fibrillation, chronic anemia. ? Possible amiodarone toxicity.   5. Rt. Sided Pleural Effusion: s/p ultrasound guided thoracentesis 05/21/19.  A total of approximately 1.85 L of dark red fluid was removed. Samples were sent to the laboratory as requested by the clinical team. Fluid analysis revealed 1463 WBC, 82 % Neutrophils, no bacteria on gram stain, LDH 140, Glucose 136. F/u CXR on 6/2 showed improvement.    6. CKD: SCr 3.41 today. Baseline SCr over the last 2 months have ranged from 2.5-3.7. Not on HD. Continue to monitor and avoid nephrotoxic agents.   For questions or updates, please contact [place] Please consult [email or web address] for contact info under     Signed, Lyda Jester, PA-C  05/23/2019 1:53 PM   History and all data above reviewed.  Patient examined.  I agree with the findings as above.  The patient has a history of reduced ejection fraction thought probably to be related to severe acute illness he was having at that time.  Action fraction had improved.  However, he has had persistent atrial fibrillation recurrent after cardioversions.  He was back with acute on chronic shortness of breath.  He.  Limited in his activities he says since at least March this.  Saturday morning he said that he had more acute shortness  of breath and he had to sit up.  It went on for several hours before he finally came to the emerge.  He has a pleural effusion status post thoracentesis.  Is been exudative effusion.  He is breathing better.  Does have appears to be flutter with a rate.  He has been on anticoagulation.   He has also been on amiodarone for several weeks. He has had an  echo demonstrated that his EF is mildly reduced.   The patient exam reveals COR:RRR (flutter)  ,  Lungs: Decreased breath sounds at the bases  ,  Abd: Positive bowel sounds, no rebound no guarding, Ext Trace edema  .  All available labs, radiology testing, previous records reviewed. Agree with documented assessment and plan.  ACUTE ON CHRONIC RESPIRATORY FAILURE:   There may be some component of acute and chronic diastolic heart failure.  However, I do not think this is the predominant issue.  I think this is primarily a lung issue with pleural effusion.  Symptomatically he is improved.  He is being managed with antibiotics.  He is getting bronchodilators as well.  I would continue with this aggressive pulmonary management.  Chronic renal insufficiency would not tolerate aggressive diuresis at this point I am not sure this is indicated.  He would not want dialysis.  Because of renal insufficiency he would not tolerate ACE inhibitor or ARB.  He is in flutter.  However, I do not think this is the major contributory factor to his respiratory decline and actually think that cardioversion would be less likely to be successful while he is being actively managed for pulmonary process.  I discussed this with Dr. Algis Liming.  I would continue the amiodarone for now in anticipation of cardioversion in the future.  RENAL INSUFFICIENCY: Of note the patient says he absolutely would not want dialysis.   Jeneen Rinks Julus Kelley  4:07 PM  05/23/2019

## 2019-05-23 NOTE — Progress Notes (Signed)
NAME:  DAISHAWN LAUF, MRN:  [id number], DOB:  1933-08-31, LOS: 4 ADMISSION DATE:  05/19/2019, CONSULTATION DATE:  5/30  REFERRING MD:  Lanny Cramp PA  CHIEF COMPLAINT:  Hypoxia   Brief History    85 year old male former smoker with underlying moderate COPD (fev1 > 60% in 2014) and oxygen dependent respiratory failure.  Patient lives at home with his wife.  He presents to the emergency room on May 19, 2019 with chest pain and shortness of breath , found to have hypoxemia and increased oxygen demands. Recent prolonged admission in March-April for  pneumonia and septic shock.  Complicated by new onset A. fib with RVR and worsening renal failure. Patient was recently seen for a pulmonary consult with Dr. Melvyn Novas in the outpatient setting.  Patient was seen for a post hospital follow-up for recent pneumonia.  Patient was having worsening shortness of breath , cough , blood tinged mucus and increased oxygen demands at 4 L.  Chest x-ray showed multifocal airspace disease on the right. Patient was treated for a pneumonia with Levaquin x7 days.. Follow-up chest x-ray on April 04, 2019 showed movement in the right upper lobe with residual right lower lobe disease and small right effusion.  And stable left lower lobe airspace disease. Patient did have an elevated sed rate , concern for possible amiodarone pulmonary toxicity was considered , however chest after antibiotics on 5/15 showed improvement.  Patient says over the last 2 weeks breathing has gotten worse in the morning of arrival to the ER he had chest pain and worsening shortness of breath. He has been afebrile.  Blood pressure was stable.  White blood cell count was mildly elevated at 12.7.  ED course: Chest x-ray with increased collapse of the right lower lobe diffuse opacities to the right lung.  And a right pleural effusion, lactic acid 1.4.  Troponin 0 0.03.  BNP is 465, creatinine 2.86, potassium 4.4.,  COVID-19 SARS NEG .     Past Medical History    A. fib on Xarelto, diastolic heart failure, cardiomyopathy, Chronic kidney disease, colon cancer history, EtOH abuse history    Significant Hospital Events   05/19/2019  - admit and O2 sats 70s-low 80s on 6l/m [place] . Alert and talking, following commands Results for SALEH, ULBRICH (MRN [id number]) as of 05/20/2019 11:02  Ref. Range 05/04/2019 11:37 05/19/2019 16:32  Sed Rate Latest Ref Range: 0 - 16 mm/hr 75 (H) 84 (H)    5/31 -  refusing bipap. Wants to eat. Oriented x 4. Calm. On HFNC 15L [place] and refusing bipap. DNR/DNI. RN asking if ok to transfer. Making urine. Not on pressors. On IV heparin in lieu of home eliquis. No bedsores per RN.. . COVID negative. Urine strep negative. Baseline CKD - creat 2.5 - 3mg % in 2020. DNR - and he confirmed against intubation. Reluctantly agreed to bipap QHS x 4h.  Home o2 3-4L [place]. Was walking at home "Recently"   05/21/2019 - wore bipap 4h last night. Did not wear more due to vertigo > Rising PCT to 6.7 c/w sepsis but no fever. Worsening renal failure to creat 3.7. TSH normal. This am feels better. 8L [place] -> 100%, 6L [place] -> 97%. CT shows moderate rt pleural effusion with RLL Consolidation and new small pericardial effusion.   Consults:    Procedures:    Significant Diagnostic Tests:    Micro Data:  5/30 BC x 2 >>   Antimicrobials:  5/30 Maxipime>> (recommend 7-8 days)  5/30 Vanc >> 5/31  Interim history/subjective:   No events overnight, tolerated BiPAP  Objective   Blood pressure 107/62, pulse 95, temperature 98.1 F (36.7 C), temperature source Oral, resp. rate 18, height 5\' 10"  (1.778 m), weight 102.6 kg, SpO2 93 %.    Vent Mode: Other (Comment) FiO2 (%):  [40 %] 40 % Set Rate:  [12 bmp] 12 bmp PEEP:  [6 cmH20] 6 cmH20   Intake/Output Summary (Last 24 hours) at 05/23/2019 0803 Last data filed at 05/23/2019 0600 Gross per 24 hour  Intake 1263 ml  Output 850 ml  Net 413 ml   Filed Weights   05/20/19 0500 05/21/19 0317 05/23/19 0204   Weight: 102.4 kg 102.8 kg 102.6 kg    General Appearance:  Well appearing, NAD Head:  [place]/AT, PERRL, EOM-I and MMM Neck:  Supple,  No enlargement/tenderness/nodules Lungs: Bibasilar crackles Heart:  RRR, Nl S1/S2 and -M/R/G Abdomen:  Soft, NT, ND and +BS Extremities:  1+ edema and -tenderness Skin:  ntact in exposed areas . Sacral area - not examined Neurologic:  Alert and interactive, moving all ext to commands  I reviewed CXR myself, left sided pleural effusion noted  LABS    PULMONARY Recent Labs  Lab 05/19/19 1751 05/23/19 0341  PHART 7.342* 7.417  PCO2ART 63.7* 49.8*  PO2ART 68.0* 56.0*  HCO3 34.6* 32.2*  TCO2 36* 34*  O2SAT 91.0 89.0    CBC Recent Labs  Lab 05/21/19 0524 05/22/19 0307 05/23/19 0330 05/23/19 0341  HGB 8.3* 8.1* 7.9* 7.8*  HCT 27.6* 27.6* 26.4* 23.0*  WBC 11.9* 9.7 7.4  --   PLT 190 198 197  --     COAGULATION No results for input(s): INR in the last 168 hours.  CARDIAC   Recent Labs  Lab 05/19/19 0901 05/21/19 0524  TROPONINI <0.03 <0.03   No results for input(s): PROBNP in the last 168 hours.   CHEMISTRY Recent Labs  Lab 05/19/19 0901 05/19/19 1632  05/20/19 0301 05/21/19 0524 05/22/19 0307 05/23/19 0330 05/23/19 0341  NA 140  --    < > 141 137 140 140 139  K 4.4  --    < > 4.8 4.5 4.5 4.3 4.3  CL 90*  --   --  98 96* 98 99  --   CO2 34*  --   --  32 30 30 32  --   GLUCOSE 129*  --   --  114* 122* 111* 98  --   BUN 59*  --   --  60* 75* 78* 75*  --   CREATININE 2.86*  --   --  2.93* 3.70* 3.71* 3.41*  --   CALCIUM 9.9  --   --  8.9 8.6* 8.7* 8.9  --   MG  --  1.8  --   --  2.3 2.3 2.3  --   PHOS  --  4.1  --   --  4.7* 4.4 3.1  --    < > = values in this interval not displayed.   Estimated Creatinine Clearance: 19.7 mL/min (A) (by C-G formula based on SCr of 3.41 mg/dL (H)).   LIVER Recent Labs  Lab 05/21/19 0524  AST 14*  ALT 10  ALKPHOS 50  BILITOT 0.5  PROT 6.0*  ALBUMIN 2.3*      INFECTIOUS Recent Labs  Lab 05/19/19 1140 05/19/19 1632 05/20/19 0301 05/21/19 0524 05/22/19 0307  LATICACIDVEN 1.4  --  1.0  --   --  PROCALCITON  --  2.52  --  6.71 5.44     ENDOCRINE CBG (last 3)  Recent Labs    05/20/19 0847  GLUCAP 104*         IMAGING x48h  - image(s) personally visualized  -   highlighted in bold Dg Chest 1 View  Result Date: 05/21/2019 CLINICAL DATA:  Status post right thoracentesis EXAM: CHEST  1 VIEW COMPARISON:  05/20/2019 CT FINDINGS: Cardiac shadow is enlarged but stable. Mild atelectasis is noted in the right mid and lower lung. No pneumothorax is noted following thoracentesis. No bony abnormality is seen. IMPRESSION: No pneumothorax following right-sided thoracentesis. Mild atelectatic changes on the right. Electronically Signed   By: Inez Catalina M.D.   On: 05/21/2019 14:30   Dg Chest Port 1 View  Result Date: 05/23/2019 CLINICAL DATA:  Right-sided pleural effusion EXAM: PORTABLE CHEST 1 VIEW COMPARISON:  05/21/2019 FINDINGS: Cardiac shadow is stable. Thickening is noted along the minor fissure on the right stable from the prior exam. Mild right basilar atelectasis is seen. No new focal abnormality is noted. No pneumothorax is seen. IMPRESSION: Stable changes on the right. No pneumothorax or sizable effusion is seen. Electronically Signed   By: Inez Catalina M.D.   On: 05/23/2019 07:54   Dg Swallowing Func-speech Pathology  Result Date: 05/21/2019 Objective Swallowing Evaluation: Type of Study: MBS-Modified Barium Swallow Study  Patient Details Name: AMOR PACKARD MRN: [id number] Date of Birth: 1933/04/20 Today's Date: 05/21/2019 Time: SLP Start Time (ACUTE ONLY): 1300 -SLP Stop Time (ACUTE ONLY): 1315 SLP Time Calculation (min) (ACUTE ONLY): 15 min Past Medical History: Past Medical History: Diagnosis Date  Alcohol abuse   Colon cancer ([place])   COPD (chronic obstructive pulmonary disease) ([place])   Emphysema of lung ([place])   Former tobacco  use   Hypertension   Peripheral edema   Sepsis ([place]) 10/2016  Aspiration PNA/C diff colitis Past Surgical History: Past Surgical History: Procedure Laterality Date  CARDIOVERSION N/A 03/16/2019  Procedure: CARDIOVERSION;  Surgeon: Pixie Casino, MD;  Location[facility]COPY;  Service: Cardiovascular;  Laterality: N/A;  CARDIOVERSION N/A 03/23/2019  Procedure: CARDIOVERSION;  Surgeon: Lelon Perla, MD;  Location: [facility] ENDOSCOPY;  Service: Cardiovascular;  Laterality: N/A;  CATARACT EXTRACTION    COLON RESECTION    COLONOSCOPY    IMPLANTATION BONE ANCHORED HEARING AID Left 2016  MINOR HEMORRHOIDECTOMY  1995  TEE WITHOUT CARDIOVERSION N/A 03/16/2019  Procedure: TRANSESOPHAGEAL ECHOCARDIOGRAM (TEE);  Surgeon: Pixie Casino, MD;  Location: [facility] ENDOSCOPY;  Service: Cardiovascular;  Laterality: N/A;  TONSILLECTOMY   HPI: Patient is an 85 y.o. male with PMH: COPD: oxygen dependent on 4L at home, atrial fibrillation on Xarelto, diastolic heart failure, cardiomyopathy, chronic kidney disease, colon cancer, ETOH abuse, who also had a prolonged hospitalization from March to April for septic shock. He arrived at the hospital on 5/30 with mid-sternal chest pain radiating to the neck and 2 week h/o worsening breathing. CXR revealed RLL collapse, mediastinal shift to the right, stable opacities in right lung, increasing opacities in LL base.  Repeat CXR revealed small to moderate right pleural effusion and trace left pleural effusion.  Subjective: pleasant, no c/o swallow problems Assessment / Plan / Recommendation CHL IP CLINICAL IMPRESSIONS 05/21/2019 Clinical Impression Patient presents with an oropharyngeal swallow that is WFL-WNL without aspiration or penetration even when taking large volume sips of thin liquids via cup and straw sips. He exhbited a trace amount of vallecular residuals, but these fully cleared with spontaneous  swallowing.  SLP Visit Diagnosis Dysphagia, unspecified (R13.10) Attention and  concentration deficit following -- Frontal lobe and executive function deficit following -- Impact on safety and function No limitations   CHL IP TREATMENT RECOMMENDATION 05/21/2019 Treatment Recommendations No treatment recommended at this time   Prognosis 05/21/2019 Prognosis for Safe Diet Advancement Good Barriers to Reach Goals -- Barriers/Prognosis Comment -- CHL IP DIET RECOMMENDATION 05/21/2019 SLP Diet Recommendations Regular solids;Thin liquid Liquid Administration via Cup;Straw Medication Administration Whole meds with liquid Compensations -- Postural Changes --   No flowsheet data found.  CHL IP FOLLOW UP RECOMMENDATIONS 05/21/2019 Follow up Recommendations None   No flowsheet data found.     CHL IP ORAL PHASE 05/21/2019 Oral Phase WFL Oral - Pudding Teaspoon -- Oral - Pudding Cup -- Oral - Honey Teaspoon -- Oral - Honey Cup -- Oral - Nectar Teaspoon -- Oral - Nectar Cup -- Oral - Nectar Straw -- Oral - Thin Teaspoon -- Oral - Thin Cup -- Oral - Thin Straw -- Oral - Puree -- Oral - Mech Soft -- Oral - Regular -- Oral - Multi-Consistency -- Oral - Pill -- Oral Phase - Comment --  CHL IP PHARYNGEAL PHASE 05/21/2019 Pharyngeal Phase WFL Pharyngeal- Pudding Teaspoon -- Pharyngeal -- Pharyngeal- Pudding Cup -- Pharyngeal -- Pharyngeal- Honey Teaspoon -- Pharyngeal -- Pharyngeal- Honey Cup -- Pharyngeal -- Pharyngeal- Nectar Teaspoon -- Pharyngeal -- Pharyngeal- Nectar Cup -- Pharyngeal -- Pharyngeal- Nectar Straw -- Pharyngeal -- Pharyngeal- Thin Teaspoon -- Pharyngeal -- Pharyngeal- Thin Cup -- Pharyngeal -- Pharyngeal- Thin Straw -- Pharyngeal -- Pharyngeal- Puree -- Pharyngeal -- Pharyngeal- Mechanical Soft -- Pharyngeal -- Pharyngeal- Regular -- Pharyngeal -- Pharyngeal- Multi-consistency -- Pharyngeal -- Pharyngeal- Pill -- Pharyngeal -- Pharyngeal Comment --  CHL IP CERVICAL ESOPHAGEAL PHASE 05/21/2019 Cervical Esophageal Phase WFL Pudding Teaspoon -- Pudding Cup -- Honey Teaspoon -- Honey Cup -- Nectar Teaspoon --  Nectar Cup -- Nectar Straw -- Thin Teaspoon -- Thin Cup -- Thin Straw -- Puree -- Mechanical Soft -- Regular -- Multi-consistency -- Pill -- Cervical Esophageal Comment -- [name] 05/21/2019, 1:47 PM Sonia Baller, MA, CCC-SLP Speech Therapy [facility] Acute Rehab Pager: [phone]              Ir Thoracentesis Asp Pleural Space W/img Guide  Result Date: 05/21/2019 INDICATION: Patient with history of COPD, emphysema, acute on chronic hypoxic and hypercapnic respiratory failure, and right pleural effusion. Request is made for diagnostic and therapeutic right thoracentesis. EXAM: ULTRASOUND GUIDED DIAGNOSTIC AND THERAPEUTIC RIGHT THORACENTESIS MEDICATIONS: 10 mL 1% lidocaine COMPLICATIONS: None immediate. PROCEDURE: An ultrasound guided thoracentesis was thoroughly discussed with the patient and questions answered. The benefits, risks, alternatives and complications were also discussed. The patient understands and wishes to proceed with the procedure. Written consent was obtained. Ultrasound was performed to localize and mark an adequate pocket of fluid in the right chest. The area was then prepped and draped in the normal sterile fashion. 1% Lidocaine was used for local anesthesia. Under ultrasound guidance a 6 Fr Safe-T-Centesis catheter was introduced. Thoracentesis was performed. The catheter was removed and a dressing applied. FINDINGS: A total of approximately 1.85 L of dark red fluid was removed. Samples were sent to the laboratory as requested by the clinical team. IMPRESSION: Successful ultrasound guided right thoracentesis yielding 1.85 L of pleural fluid. Read by: Earley Abide, PA-C Electronically Signed   By: Jerilynn Mages.  Shick M.D.   On: 05/21/2019 14:31    Resolved Hospital Problem list  Assessment & Plan:  Hypoxemia: - Titrate O2 for sat of 88-92%  Acute on chronic respiratory failure: - Change BiPAP to PRN  Pleural effusion: - Diureses as needed  GOC: - DNR status noted  PCCM will  sign off, please call back if needed  Future Appointments  Date Time Provider [place]  05/31/2019  9:00 AM [facility] ROOM 4 [facility] None  06/05/2019 10:30 AM Tanda Rockers, MD [facility] None  06/06/2019 10:15 AM Lorretta Harp, MD [facility]   Discussed with PCCM-NP  Rush Farmer, M.D. [facility] Pulmonary/Critical Care Medicine. Pager: [phone]. After hours pager: [phone].

## 2019-05-23 NOTE — Progress Notes (Signed)
Inpatient Rehab Admissions:  Inpatient Rehab Consult received.  I met with patient at the bedside for rehabilitation assessment and to discuss goals and expectations of an inpatient rehab admission.  He is hopeful for CIR to maximize independence prior to returning home with his wife. I will need to open up a case with his insurance for prior authorization and confirm 24/7 assist from his wife before I can proceed with a possible admission when medically ready.   Signed: Shann Medal, PT, DPT Admissions Coordinator [phone] 05/23/19  11:30 AM

## 2019-05-23 NOTE — Progress Notes (Addendum)
PROGRESS NOTE   David Barton  FXT:[id number]    DOB: 1933-01-20    DOA: 05/19/2019  PCP: Maury Dus, MD   I have briefly reviewed patients previous medical records in [facility].  Brief Narrative:  85 year old male with PMH of COPD, chronic hypoxic respiratory failure on home oxygen at 4 L/min, chronic systolic CHF with EF 29% in 2017 which subsequently normalized, A. fib/PSVT with history of TEE cardioversion, HTN, stage IV CKD, alcohol abuse, colon cancer who initially presented with chest pain and dyspnea.  Recent prolonged hospitalization in March/April for pneumonia, septic shock complicated by A. fib with RVR and worsening renal failure.  In ED developed progressive hypoxia, placed on BiPAP which patient not fully compliant with, DNR, CT chest 5/31 showed new right-sided consolidation with right moderate pleural effusion, high PCT, suspecting H CAP, status post thoracentesis 05/21/2019.  Cardiology consulted 6/3.   Assessment & Plan:   Principal Problem:   Acute on chronic respiratory failure ([name]) Active Problems:   AKI (acute kidney injury) ([place])   Atrial fibrillation with RVR ([name])   HCAP (healthcare-associated pneumonia)   Chronic respiratory failure with hypoxia ([name])   Acute on chronic hypoxic respiratory failure - Suspected due to HCAP and right-sided pleural effusion. - Remains on IV cefepime.  MRSA PCR negative and vancomycin discontinued. - Blood cultures x2: Negative to date. - SARS coronavirus 2: Negative. - CT chest showed worsening right sided consolidation and effusion and hence underwent thoracentesis, please see discussion below. - After MBS and as per speech therapy recommendation, remains on regular diet and thin liquids with low index of suspicion for aspiration. - Acetylcholine receptor antibody results pending. -On home oxygen 3-4 L/min at baseline. -Subsequently willing to try BiPAP for a couple of hours at bedtime. -Target for oxygen saturation  between 88-92%.  Suspected right lower lobe HCAP -Remains on IV cefepime.  Cultures thus far negative.  Right-sided pleural effusion -Status post ultrasound-guided thoracentesis by IR on 6/1: Total of 1.85 L of dark red fluid was removed. - Pleural fluid: WBCs 1463, 82% neutrophils, protein 3.2, LDH 140, culture negative to date.  Reactive mesothelial cells and macrophages present, no atypia seen. -Suspected exudative?  Parapneumonic. -Remains on IV cefepime. -Chest x-ray 6/3 without sizable effusion.  Improved. -I discussed with pulmonology and requested to reassess in a.m. regarding etiology and further course.  Acute on stage IV chronic kidney disease -Baseline creatinine reportedly approximately 2.5. -On 6/1 creatinine up to 3.7.  Lasix discontinued.  Creatinine has improved to 3.4. -Patient absolutely does not want dialysis.  Acute systolic CHF/LVEF 92-42%/[id number]. -Last EF was 60-65%. -Clinically appears euvolemic.  Diuretics on hold due to acute kidney injury. -Cardiology input appreciated.  Discussed with Dr. Percival Spanish.  Because of renal insufficiency, not a candidate for ACEI or ARB.  He does not think that congestive heart failure is the contributor for patient's pleural effusion.  A. fib/flutter with RVR -Failed TEE cardioversion on 03/16/2019. -Xarelto changed to heparin by PCCM for procedures. -Remains on amiodarone and Zebeta. -Cardizem on hold. -As per discussion with cardiology, cardioversion may be less likely to be successful until primary pulmonary processes addressed.  Elevated TSH -Possibly sick euthyroid.  Follow TSH in 4 weeks.  Gout -No acute flare presently.  Continue allopurinol prophylaxis.  Morbid obesity/Body mass index is 32.46 kg/m.  Suspected OSA Outpatient evaluation.  Possible home with BiPAP although patient not sure if he will tolerate.  Macrocytic anemia Stable.  Getting IV and oral iron.  Physical deconditioning -Possibly CIR  when improved.  DVT prophylaxis: IV heparin infusion ongoing Code Status: DNR Family Communication: None at bedside Disposition: To be determined pending clinical improvement   Consultants:  PCCM Cardiology IR  Procedures:  Ultrasound guided thoracentesis 6/1  Antimicrobials:  IV cefepime   Subjective: Patient states that he feels much better.  Dyspnea significantly improved and breathing almost but not yet at baseline.  Used BiPAP last night for about 4 hours.  Denies cough or chest pain.  Asking when he can be discharged  ROS: As above  Objective:  Vitals:   05/23/19 1155 05/23/19 1200 05/23/19 1300 05/23/19 1547  BP:  113/65 (!) 99/54   Pulse:  98 98   Resp:  19 (!) 25   Temp: 98 F (36.7 C)   98.3 F (36.8 C)  TempSrc: Oral   Oral  SpO2:  95% 92%   Weight:      Height:        Examination:  General exam: Pleasant elderly male, moderately built and obese sitting up comfortably in chair this morning without distress. Respiratory system: Reduced breath sounds in the bases, right >left with few right basal crackles.  Occasional rhonchi posteriorly.  Otherwise clear to auscultation.  No increased work of breathing. Cardiovascular system: S1 & S2 heard, RRR. No JVD, murmurs, rubs, gallops or clicks.  Trace bilateral leg edema.  Telemetry personally reviewed: Sinus rhythm with BBB morphology. Gastrointestinal system: Abdomen is nondistended, soft and nontender. No organomegaly or masses felt. Normal bowel sounds heard. Central nervous system: Alert and oriented. No focal neurological deficits. Extremities: Symmetric 5 x 5 power. Skin: No rashes, lesions or ulcers Psychiatry: Judgement and insight appear normal. Mood & affect appropriate.     Data Reviewed: I have personally reviewed following labs and imaging studies  CBC: Recent Labs  Lab 05/19/19 0901  05/20/19 0301 05/21/19 0524 05/22/19 0307 05/23/19 0330 05/23/19 0341  WBC 12.7*  --  16.9* 11.9* 9.7  7.4  --   NEUTROABS 9.2*  --   --   --   --   --   --   HGB 10.0*   < > 9.2* 8.3* 8.1* 7.9* 7.8*  HCT 34.1*   < > 31.1* 27.6* 27.6* 26.4* 23.0*  MCV 108.9*  --  108.0* 107.4* 107.4* 106.5*  --   PLT 237  --  204 190 198 197  --    < > = values in this interval not displayed.   Basic Metabolic Panel: Recent Labs  Lab 05/19/19 0901 05/19/19 1632  05/20/19 0301 05/21/19 0524 05/22/19 0307 05/23/19 0330 05/23/19 0341  NA 140  --    < > 141 137 140 140 139  K 4.4  --    < > 4.8 4.5 4.5 4.3 4.3  CL 90*  --   --  98 96* 98 99  --   CO2 34*  --   --  32 30 30 32  --   GLUCOSE 129*  --   --  114* 122* 111* 98  --   BUN 59*  --   --  60* 75* 78* 75*  --   CREATININE 2.86*  --   --  2.93* 3.70* 3.71* 3.41*  --   CALCIUM 9.9  --   --  8.9 8.6* 8.7* 8.9  --   MG  --  1.8  --   --  2.3 2.3 2.3  --   PHOS  --  4.1  --   --  4.7* 4.4 3.1  --    < > = values in this interval not displayed.   Liver Function Tests: Recent Labs  Lab 05/21/19 0524  AST 14*  ALT 10  ALKPHOS 50  BILITOT 0.5  PROT 6.0*  ALBUMIN 2.3*   Cardiac Enzymes: Recent Labs  Lab 05/19/19 0901 05/21/19 0524  TROPONINI <0.03 <0.03   CBG: Recent Labs  Lab 05/19/19 1601 05/20/19 0847  GLUCAP 110* 104*    Recent Results (from the past 240 hour(s))  SARS Coronavirus 2 (CEPHEID- Performed in [place] hospital lab), Hosp Order     Status: None   Collection Time: 05/19/19  9:35 AM  Result Value Ref Range Status   SARS Coronavirus 2 NEGATIVE NEGATIVE Final    Comment: (NOTE) If result is NEGATIVE SARS-CoV-2 target nucleic acids are NOT DETECTED. The SARS-CoV-2 RNA is generally detectable in upper and lower  respiratory specimens during the acute phase of infection. The lowest  concentration of SARS-CoV-2 viral copies this assay can detect is 250  copies / mL. A negative result does not preclude SARS-CoV-2 infection  and should not be used as the sole basis for treatment or other  patient management  decisions.  A negative result may occur with  improper specimen collection / handling, submission of specimen other  than nasopharyngeal swab, presence of viral mutation(s) within the  areas targeted by this assay, and inadequate number of viral copies  (<250 copies / mL). A negative result must be combined with clinical  observations, patient history, and epidemiological information. If result is POSITIVE SARS-CoV-2 target nucleic acids are DETECTED. The SARS-CoV-2 RNA is generally detectable in upper and lower  respiratory specimens dur ing the acute phase of infection.  Positive  results are indicative of active infection with SARS-CoV-2.  Clinical  correlation with patient history and other diagnostic information is  necessary to determine patient infection status.  Positive results do  not rule out bacterial infection or co-infection with other viruses. If result is PRESUMPTIVE POSTIVE SARS-CoV-2 nucleic acids MAY BE PRESENT.   A presumptive positive result was obtained on the submitted specimen  and confirmed on repeat testing.  While 2019 novel coronavirus  (SARS-CoV-2) nucleic acids may be present in the submitted sample  additional confirmatory testing may be necessary for epidemiological  and / or clinical management purposes  to differentiate between  SARS-CoV-2 and other Sarbecovirus currently known to infect humans.  If clinically indicated additional testing with an alternate test  methodology [phone]) is advised. The SARS-CoV-2 RNA is generally  detectable in upper and lower respiratory sp ecimens during the acute  phase of infection. The expected result is Negative. Fact Sheet for Patients:  StrictlyIdeas.no Fact Sheet for Healthcare Providers: BankingDealers.co.za This test is not yet approved or cleared by the [place] FDA and has been authorized for detection and/or diagnosis of SARS-CoV-2 by FDA under an  Emergency Use Authorization (EUA).  This EUA will remain in effect (meaning this test can be used) for the duration of the COVID-19 declaration under Section 564(b)(1) of the Act, 21 U.S.C. section 360bbb-3(b)(1), unless the authorization is terminated or revoked sooner. Performed at [facility], [place] [address] [place], [place] [address]   Culture, blood (routine x 2)     Status: None (Preliminary result)   Collection Time: 05/19/19 11:38 AM  Result Value Ref Range Status   Specimen Description BLOOD LEFT ANTECUBITAL  Final   Special Requests   Final  BOTTLES DRAWN AEROBIC AND ANAEROBIC Blood Culture adequate volume   Culture   Final    NO GROWTH 4 DAYS Performed at [facility], [place] [address] [place], [place] [address]    Report Status PENDING  Incomplete  Culture, blood (routine x 2)     Status: None (Preliminary result)   Collection Time: 05/19/19 11:40 AM  Result Value Ref Range Status   Specimen Description BLOOD RIGHT ANTECUBITAL  Final   Special Requests   Final    BOTTLES DRAWN AEROBIC AND ANAEROBIC Blood Culture results may not be optimal due to an inadequate volume of blood received in culture bottles   Culture   Final    NO GROWTH 4 DAYS Performed at [facility], [place] [address] [place], [place] [address]    Report Status PENDING  Incomplete  MRSA PCR Screening     Status: None   Collection Time: 05/19/19  4:20 PM  Result Value Ref Range Status   MRSA by PCR NEGATIVE NEGATIVE Final    Comment:        The GeneXpert MRSA Assay (FDA approved for NASAL specimens only), is one component of a comprehensive MRSA colonization surveillance program. It is not intended to diagnose MRSA infection nor to guide or monitor treatment for MRSA infections. Performed at [facility], [place] [address], [place], [place] [address]   Acid Fast Smear (AFB)     Status: None   Collection Time: 05/21/19  2:08 PM  Result Value Ref Range Status   AFB  Specimen Processing Concentration  Final   Acid Fast Smear Negative  Final    Comment: (NOTE) Performed At: [facility] [place], [place] [id number] Rush Farmer MD [id number]    Source (AFB) FLUID  Final    Comment: RIGHT PLEURAL Performed at [facility], [place] [address], [place], [place] [address]   Culture, body fluid-bottle     Status: None (Preliminary result)   Collection Time: 05/21/19  2:08 PM  Result Value Ref Range Status   Specimen Description FLUID  Final   Special Requests RIGHT PLEURAL  Final   Culture   Final    NO GROWTH 2 DAYS Performed at [facility], [place] [address], [place], [place] [address]    Report Status PENDING  Incomplete  Gram stain     Status: None   Collection Time: 05/21/19  2:08 PM  Result Value Ref Range Status   Specimen Description FLUID  Final   Special Requests RIGHT PLEURAL  Final   Gram Stain   Final    ABUNDANT WBC PRESENT, PREDOMINANTLY PMN NO ORGANISMS SEEN Performed at [facility], [place] [address] [place],  [address]    Report Status 05/21/2019 FINAL  Final         Radiology Studies: Dg Chest Port 1 View  Result Date: 05/23/2019 CLINICAL DATA:  Right-sided pleural effusion EXAM: PORTABLE CHEST 1 VIEW COMPARISON:  05/21/2019 FINDINGS: Cardiac shadow is stable. Thickening is noted along the minor fissure on the right stable from the prior exam. Mild right basilar atelectasis is seen. No new focal abnormality is noted. No pneumothorax is seen. IMPRESSION: Stable changes on the right. No pneumothorax or sizable effusion is seen. Electronically Signed   By: Inez Catalina M.D.   On: 05/23/2019 07:54        Scheduled Meds: . allopurinol  100 mg Oral Daily  . amiodarone  200  mg Oral Daily  . bisacodyl  10 mg Oral Daily  . bisoprolol  5 mg Oral Daily  . budesonide (PULMICORT) nebulizer solution  0.25 mg Nebulization BID  . Chlorhexidine Gluconate Cloth  6 each Topical Daily  .  ferrous sulfate  325 mg Oral Daily  . sodium chloride flush  3 mL Intravenous Q12H   Continuous Infusions: . sodium chloride    . ceFEPime (MAXIPIME) IV 2 g (05/23/19 1144)  . [START ON 05/24/2019] ferumoxytol    . heparin 1,200 Units/hr (05/23/19 1200)     LOS: 4 days     Vernell Leep, MD, FACP, [facility]. [facility]  To contact the attending provider between 7A-7P or the covering provider during after hours 7P-7A, please log into the web site [email or web address] and access using universal [place] password for that web site. If you do not have the password, please call the hospital operator.  05/23/2019, 5:07 PM

## 2019-05-23 NOTE — Progress Notes (Signed)
Patient refuses to wear BiPAP. RT will continue to monitor pt throughout the night.

## 2019-05-24 DIAGNOSIS — J90 Pleural effusion, not elsewhere classified: Secondary | ICD-10-CM

## 2019-05-24 DIAGNOSIS — N179 Acute kidney failure, unspecified: Secondary | ICD-10-CM

## 2019-05-24 LAB — BASIC METABOLIC PANEL
Anion gap: 9 (ref 5–15)
BUN: 69 mg/dL — ABNORMAL HIGH (ref 8–23)
CO2: 32 mmol/L (ref 22–32)
Calcium: 9.2 mg/dL (ref 8.9–10.3)
Chloride: 103 mmol/L (ref 98–111)
Creatinine, Ser: 3.4 mg/dL — ABNORMAL HIGH (ref 0.61–1.24)
GFR calc Af Amer: 18 mL/min — ABNORMAL LOW (ref >60)
GFR calc non Af Amer: 16 mL/min — ABNORMAL LOW (ref >60)
Glucose, Bld: 95 mg/dL (ref 70–99)
Potassium: 4.9 mmol/L (ref 3.5–5.1)
Sodium: 144 mmol/L (ref 135–145)

## 2019-05-24 LAB — CULTURE, BLOOD (ROUTINE X 2)
Culture: NO GROWTH
Culture: NO GROWTH
Special Requests: ADEQUATE

## 2019-05-24 LAB — PHOSPHORUS: Phosphorus: 2.6 mg/dL (ref 2.5–4.6)

## 2019-05-24 LAB — CBC
HCT: 28.7 % — ABNORMAL LOW (ref 39.0–52.0)
Hemoglobin: 8.5 g/dL — ABNORMAL LOW (ref 13.0–17.0)
MCH: 31.8 pg (ref 26.0–34.0)
MCHC: 29.6 g/dL — ABNORMAL LOW (ref 30.0–36.0)
MCV: 107.5 fL — ABNORMAL HIGH (ref 80.0–100.0)
Platelets: 221 10*3/uL (ref 150–400)
RBC: 2.67 MIL/uL — ABNORMAL LOW (ref 4.22–5.81)
RDW: 16.6 % — ABNORMAL HIGH (ref 11.5–15.5)
WBC: 6.8 10*3/uL (ref 4.0–10.5)
nRBC: 0.3 % — ABNORMAL HIGH (ref 0.0–0.2)

## 2019-05-24 LAB — APTT: aPTT: 63 seconds — ABNORMAL HIGH (ref 24–36)

## 2019-05-24 LAB — MAGNESIUM: Magnesium: 2.2 mg/dL (ref 1.7–2.4)

## 2019-05-24 LAB — HEPARIN LEVEL (UNFRACTIONATED): Heparin Unfractionated: 0.38 IU/mL (ref 0.30–0.70)

## 2019-05-24 MED ORDER — FUROSEMIDE 40 MG PO TABS
40.0000 mg | ORAL_TABLET | Freq: Every day | ORAL | Status: DC
  Administered 2019-05-24: 40 mg via ORAL
  Filled 2019-05-24 (×2): qty 1

## 2019-05-24 MED ORDER — AMOXICILLIN-POT CLAVULANATE 500-125 MG PO TABS
1.0000 | ORAL_TABLET | Freq: Two times a day (BID) | ORAL | Status: AC
  Administered 2019-05-24 – 2019-05-27 (×7): 500 mg via ORAL
  Filled 2019-05-24 (×7): qty 1

## 2019-05-24 MED ORDER — APIXABAN 2.5 MG PO TABS
2.5000 mg | ORAL_TABLET | Freq: Two times a day (BID) | ORAL | Status: DC
  Administered 2019-05-24 – 2019-05-30 (×13): 2.5 mg via ORAL
  Filled 2019-05-24 (×13): qty 1

## 2019-05-24 NOTE — Progress Notes (Signed)
ANTICOAGULATION CONSULT NOTE   Pharmacy Consult for heparin Indication: atrial fibrillation  Patient Measurements: Height: 5\' 10"  (177.8 cm) Weight: 227 lb 1.2 oz (103 kg) IBW/kg (Calculated) : 73 Heparin Dosing Weight: 93.8kg  Vital Signs: Temp: 97.6 F (36.4 C) (06/04 0855) Temp Source: Oral (06/04 0855) BP: 146/84 (06/04 0800) Pulse Rate: 101 (06/04 0826)  Labs: Recent Labs    05/22/19 0307 05/22/19 1923 05/23/19 0330 05/23/19 0341 05/24/19 0454  HGB 8.1*  --  7.9* 7.8* 8.5*  HCT 27.6*  --  26.4* 23.0* 28.7*  PLT 198  --  197  --  221  APTT 61* 71* 71*  --  63*  HEPARINUNFRC 1.03*  --  0.57  --  0.38  CREATININE 3.71*  --  3.41*  --  3.40*    Estimated Creatinine Clearance: 19.8 mL/min (A) (by C-G formula based on SCr of 3.4 mg/dL (H)).  Assessment: 18 yom presented to the ED with CP. He is on chronic xarelto for history of afib which was transitioned to IV heparin.   Hep lvl therapeutic 0.38  Cbc stable  Goal of Therapy:  Heparin level 0.3-0.7 units/ml Monitor platelets by anticoagulation protocol: Yes   Plan:  Continue heparin gtt at 1200 units/hr Daily heparin level, CBC   Levester Fresh, PharmD, BCPS, BCCCP Clinical Pharmacist [phone]  Please check AMION for all [place] numbers  05/24/2019 9:25 AM

## 2019-05-24 NOTE — Progress Notes (Signed)
NAME:  David Barton, MRN:  [id number], DOB:  18-Nov-1933, LOS: 5 ADMISSION DATE:  05/19/2019, CONSULTATION DATE:  5/30  REFERRING MD:  Lanny Cramp PA  CHIEF COMPLAINT:  Hypoxia   Brief History    85 year old male former smoker with underlying moderate COPD (fev1 > 60% in 2014) and oxygen dependent respiratory failure.  Patient lives at home with his wife.  He presents to the emergency room on May 19, 2019 with chest pain and shortness of breath , found to have hypoxemia and increased oxygen demands. Recent prolonged admission in March-April for  pneumonia and septic shock.  Complicated by new onset A. fib with RVR and worsening renal failure. Patient was recently seen for a pulmonary consult with Dr. Melvyn Novas in the outpatient setting.  Patient was seen for a post hospital follow-up for recent pneumonia.  Patient was having worsening shortness of breath , cough , blood tinged mucus and increased oxygen demands at 4 L.  Chest x-ray showed multifocal airspace disease on the right. Patient was treated for a pneumonia with Levaquin x7 days.. Follow-up chest x-ray on April 04, 2019 showed movement in the right upper lobe with residual right lower lobe disease and small right effusion.  And stable left lower lobe airspace disease. Patient did have an elevated sed rate , concern for possible amiodarone pulmonary toxicity was considered , however chest after antibiotics on 5/15 showed improvement.  Patient says over the last 2 weeks breathing has gotten worse in the morning of arrival to the ER he had chest pain and worsening shortness of breath. He has been afebrile.  Blood pressure was stable.  White blood cell count was mildly elevated at 12.7.  ED course: Chest x-ray with increased collapse of the right lower lobe diffuse opacities to the right lung.  And a right pleural effusion, lactic acid 1.4.  Troponin 0 0.03.  BNP is 465, creatinine 2.86, potassium 4.4.,  COVID-19 SARS NEG .     Past Medical History   A. fib on Xarelto, diastolic heart failure, cardiomyopathy, Chronic kidney disease, colon cancer history, EtOH abuse history    Significant Hospital Events   05/19/2019  - admit and O2 sats 70s-low 80s on 6l/m [place] . Alert and talking, following commands Results for TIP, [name] (MRN [id number]) as of 05/20/2019 11:02  Ref. Range 05/04/2019 11:37 05/19/2019 16:32  Sed Rate Latest Ref Range: 0 - 16 mm/hr 75 (H) 84 (H)    5/31 -  refusing bipap. Wants to eat. Oriented x 4. Calm. On HFNC 15L [place] and refusing bipap. DNR/DNI. RN asking if ok to transfer. Making urine. Not on pressors. On IV heparin in lieu of home eliquis. No bedsores per RN.. . COVID negative. Urine strep negative. Baseline CKD - creat 2.5 - 3mg % in 2020. DNR - and he confirmed against intubation. Reluctantly agreed to bipap QHS x 4h.  Home o2 3-4L [place]. Was walking at home "Recently"   05/21/2019 - wore bipap 4h last night. Did not wear more due to vertigo > Rising PCT to 6.7 c/w sepsis but no fever. Worsening renal failure to creat 3.7. TSH normal. This am feels better. 8L [place] -> 100%, 6L  -> 97%. CT shows moderate rt pleural effusion with RLL Consolidation and new small pericardial effusion.   Consults:    Procedures:    Significant Diagnostic Tests:    Micro Data:  5/30 BC x 2 >>   Antimicrobials:  5/30 Maxipime>> (recommend 7-8 days)   5/30  Vanc >> 5/31  Interim history/subjective:   No events overnight, refused BiPAP overnight but for a short period of time  Objective   Blood pressure (!) 146/84, pulse (!) 101, temperature 97.6 F (36.4 C), temperature source Oral, resp. rate (!) 35, height 5\' 10"  (1.778 m), weight 103 kg, SpO2 98 %.    Vent Mode: PCV FiO2 (%):  [40 %] 40 % Set Rate:  [12 bmp] 12 bmp PEEP:  [6 cmH20] 6 cmH20   Intake/Output Summary (Last 24 hours) at 05/24/2019 0945 Last data filed at 05/24/2019 0800 Gross per 24 hour  Intake 993.51 ml  Output 1150 ml  Net -156.49 ml   Filed Weights    05/21/19 0317 05/23/19 0204 05/24/19 0455  Weight: 102.8 kg 102.6 kg 103 kg    General Appearance:  Well appearing, NAD Head:  [place]/AT, PERRL, EOM-I and MMM Neck:  Supple Lungs: Decreased BS diffusely Heart:  RRR, Nl S1/S2 and -M/R/G Abdomen:  Soft, NT, ND and +BS Extremities:  1+ edema and -tenderness Skin:  ntact in exposed areas . Sacral area - not examined Neurologic:  Alert and interactive, moving all ext to commands  I reviewed CXR myself, lung inflated post thora  LABS    PULMONARY Recent Labs  Lab 05/19/19 1751 05/23/19 0341  PHART 7.342* 7.417  PCO2ART 63.7* 49.8*  PO2ART 68.0* 56.0*  HCO3 34.6* 32.2*  TCO2 36* 34*  O2SAT 91.0 89.0    CBC Recent Labs  Lab 05/22/19 0307 05/23/19 0330 05/23/19 0341 05/24/19 0454  HGB 8.1* 7.9* 7.8* 8.5*  HCT 27.6* 26.4* 23.0* 28.7*  WBC 9.7 7.4  --  6.8  PLT 198 197  --  221    COAGULATION No results for input(s): INR in the last 168 hours.  CARDIAC   Recent Labs  Lab 05/19/19 0901 05/21/19 0524  TROPONINI <0.03 <0.03   No results for input(s): PROBNP in the last 168 hours.   CHEMISTRY Recent Labs  Lab 05/19/19 1632  05/20/19 0301 05/21/19 0524 05/22/19 0307 05/23/19 0330 05/23/19 0341 05/24/19 0454  NA  --    < > 141 137 140 140 139 144  K  --    < > 4.8 4.5 4.5 4.3 4.3 4.9  CL  --   --  98 96* 98 99  --  103  CO2  --   --  32 30 30 32  --  32  GLUCOSE  --   --  114* 122* 111* 98  --  95  BUN  --   --  60* 75* 78* 75*  --  69*  CREATININE  --   --  2.93* 3.70* 3.71* 3.41*  --  3.40*  CALCIUM  --   --  8.9 8.6* 8.7* 8.9  --  9.2  MG 1.8  --   --  2.3 2.3 2.3  --  2.2  PHOS 4.1  --   --  4.7* 4.4 3.1  --  2.6   < > = values in this interval not displayed.   Estimated Creatinine Clearance: 19.8 mL/min (A) (by C-G formula based on SCr of 3.4 mg/dL (H)).   LIVER Recent Labs  Lab 05/21/19 0524  AST 14*  ALT 10  ALKPHOS 50  BILITOT 0.5  PROT 6.0*  ALBUMIN 2.3*     INFECTIOUS Recent Labs   Lab 05/19/19 1140 05/19/19 1632 05/20/19 0301 05/21/19 0524 05/22/19 0307  LATICACIDVEN 1.4  --  1.0  --   --  PROCALCITON  --  2.52  --  6.71 5.44     ENDOCRINE CBG (last 3)  No results for input(s): GLUCAP in the last 72 hours.       IMAGING x48h  - image(s) personally visualized  -   highlighted in bold Dg Chest Port 1 View  Result Date: 05/23/2019 CLINICAL DATA:  Right-sided pleural effusion EXAM: PORTABLE CHEST 1 VIEW COMPARISON:  05/21/2019 FINDINGS: Cardiac shadow is stable. Thickening is noted along the minor fissure on the right stable from the prior exam. Mild right basilar atelectasis is seen. No new focal abnormality is noted. No pneumothorax is seen. IMPRESSION: Stable changes on the right. No pneumothorax or sizable effusion is seen. Electronically Signed   By: Inez Catalina M.D.   On: 05/23/2019 07:54    Resolved Hospital Problem list     Assessment & Plan:  Hypoxemia: - Titrate O2 for sat of 88-92%  Acute on chronic respiratory failure: - Patient continues to refuse BiPAP so will not arrange for home BiPAP  Pleural effusion: likely a parapneumonic effusion, no cultures growing, pH is 7.5 and glucose is normal not supporting an empyema - Keep dry - Treat infection - CXR upon f/u  Pneumonia: likely aspiration in nature (recognize that patient passed SLP but persistent RLL pneumonia failing treatment are usually aspiration). - Change from cefepime to augmentin - F/U on culture - Aspiration precautions - In 4-6 weeks after the infection is treated would recommend a repeat CT to evaluate the airways to see if a bronch is necessary (would not perform at this point).  A-fib: - Cards following - No cardioversion for now, effusion is likely multifactorial but but primary insult is a parapneumonic effusion  GOC: - DNR status noted  F/U with Dr Melvyn Novas already arranged on 6/16 at 10:30 AM with [facility].  Discussed with TR[facility]-MD  PCCM will sign off,  please call back if needed  Future Appointments  Date Time Provider [place]  05/31/2019  9:00 AM [facility] ROOM 4 [facility] None  06/05/2019 10:30 AM Tanda Rockers, MD [facility] None  06/06/2019 10:15 AM Lorretta Harp, MD [facility]   Discussed with PCCM-NP  Rush Farmer, M.D. [facility] Pulmonary/Critical Care Medicine. Pager: [phone]. After hours pager: [phone].

## 2019-05-24 NOTE — Progress Notes (Signed)
PROGRESS NOTE   David Barton  KDX:[id number]    DOB: 1933/12/06    DOA: 05/19/2019  PCP: Maury Dus, MD   I have briefly reviewed patients previous medical records in [facility].  Brief Narrative:  85 year old male with PMH of COPD, chronic hypoxic respiratory failure on home oxygen at 4 L/min, chronic systolic CHF with EF 97% in 2017 which subsequently normalized, A. fib/PSVT with history of TEE cardioversion, HTN, stage IV CKD, alcohol abuse, colon cancer who initially presented with chest pain and dyspnea.  Recent prolonged hospitalization in March/April for pneumonia, septic shock complicated by A. fib with RVR and worsening renal failure.  In ED developed progressive hypoxia, placed on BiPAP which patient not fully compliant with, DNR, CT chest 5/31 showed new right-sided consolidation with right moderate pleural effusion, high PCT, suspecting H CAP, status post thoracentesis 05/21/2019.  Cardiology consulted 6/3.   Assessment & Plan:   Principal Problem:   Acute on chronic respiratory failure ([name]) Active Problems:   AKI (acute kidney injury) ([place])   Atrial fibrillation with RVR ([name])   HCAP (healthcare-associated pneumonia)   Chronic respiratory failure with hypoxia ([name])   Acute on chronic hypoxic respiratory failure - Suspected due to HCAP and right-sided pleural effusion.  He may have underlying undiagnosed sleep apnea. - Remains on IV cefepime.  MRSA PCR negative and vancomycin discontinued. - Blood cultures x2: Negative to date. - SARS coronavirus 2: Negative. - CT chest showed worsening right sided consolidation and effusion and hence underwent thoracentesis, please see discussion below. - After MBS and as per speech therapy recommendation, remains on regular diet and thin liquids with low index of suspicion for aspiration. - Acetylcholine receptor antibody results pending. - On home oxygen 3-4 L/min at baseline. -Target for oxygen saturation between 88-92%.  -Patient declining or unable to tolerate BiPAP more than a short time.  Discussed with Dr. Nelda Marseille, thereby no BiPAP at discharge.  Suspected right lower lobe HCAP versus likely aspiration pneumonia -Remains on IV cefepime.  Cultures thus far negative. -As discussed with Dr. Nelda Marseille, PCCM, despite negative swallow evaluation, patient has had persistent RLL pneumonia despite multiple antibiotic courses. -Changed cefepime to Augmentin and complete total 7 days course. -Recommend repeating CT chest in 4 to 6 weeks and outpatient follow-up arranged arranged by pulmonology.  Right-sided parapneumonic pleural effusion -Status post ultrasound-guided thoracentesis by IR on 6/1: Total of 1.85 L of dark red fluid was removed. - Pleural fluid: WBCs 1463, 82% neutrophils, protein 3.2, LDH 140, culture negative to date.  Reactive mesothelial cells and macrophages present, no atypia seen. -Suspected exudative?  Parapneumonic. -IV cefepime changed to Augmentin. -Chest x-ray 6/3 without sizable effusion.  Improved. -Pulmonology follow-up appreciated, discussed in detail with Dr. Nelda Marseille.  Management as outlined above.  Acute on stage IV chronic kidney disease -Baseline creatinine reportedly approximately 2.5. -On 6/1 creatinine up to 3.7.  Lasix discontinued.  Creatinine has improved and plateaued in the 3.4 range over the last 2 days. -Patient absolutely does not want dialysis.  Acute systolic CHF/LVEF 67-34%/[id number]. -Last EF was 60-65%. -Clinically appears euvolemic.  Diuretics were temporarily held.  Lasix 40 mg daily restarted. -Cardiology input appreciated.  Discussed with Dr. Percival Spanish 6/3.  Because of renal insufficiency, not a candidate for ACEI or ARB.  He does not think that congestive heart failure is the contributor for patient's pleural effusion.  A. fib/flutter with RVR -Failed TEE cardioversion on 03/16/2019. -Xarelto changed to heparin by PCCM for procedures. -Remains on amiodarone  and Zebeta. -Cardizem on hold. -As per discussion with cardiology, cardioversion may be less likely to be successful until primary pulmonary processes addressed.   -IV heparin discontinued and started Eliquis due to chronic kidney disease. - Op f/u with Dr. Quay Burow, Cardiology 6/17  Elevated TSH -Possibly sick euthyroid.  Follow TSH in 4 weeks.  Gout -No acute flare presently.  Continue allopurinol prophylaxis.  Morbid obesity/Body mass index is 32.58 kg/m.  Suspected OSA Outpatient evaluation.  As discussed with pulmonology, patient unable to tolerate BiPAP and hence can continue just oxygen via nasal cannula.  Macrocytic anemia Stable.  Getting IV and oral iron.  Physical deconditioning -DC to CIR pending insurance approval and acceptance by CIR.  DVT prophylaxis: IV heparin changed to Eliquis. Code Status: DNR Family Communication: I discussed in detail with patient's spouse, updated care and answered questions. Disposition: DC to CIR pending acceptance and insurance approval.   Consultants:  PCCM Cardiology IR  Procedures:  Ultrasound guided thoracentesis 6/1  Antimicrobials:  IV cefepime   Subjective: Overall continues to feel better.  Reports that his breathing is almost back to baseline.  Mild intermittent mostly dry cough.  No chest pain.  Denies wheezing.  As per RN, unable to tolerate BiPAP but for a short while last night.  ROS: As above  Objective:  Vitals:   05/24/19 1200 05/24/19 1300 05/24/19 1400 05/24/19 1500  BP: 112/66 (!) 107/58 93/69 116/70  Pulse: 93 99 96 93  Resp: (!) 25 (!) 27 (!) 26 (!) 21  Temp:      TempSrc:      SpO2: 96% 94% 95% 92%  Weight:      Height:        Examination:  General exam: Pleasant elderly male, moderately built and obese sitting up comfortably in chair this morning without distress. Respiratory system: Slightly diminished breath sounds in the bases with occasional rhonchi posteriorly.  Otherwise  clear to auscultation. Cardiovascular system: S1 & S2 heard, RRR. No JVD, murmurs, rubs, gallops or clicks.  Trace bilateral leg edema.  Telemetry personally reviewed: Sinus rhythm with BBB morphology. Gastrointestinal system: Abdomen is nondistended, soft and nontender. No organomegaly or masses felt. Normal bowel sounds heard.  Stable. Central nervous system: Alert and oriented. No focal neurological deficits.  Stable. Extremities: Symmetric 5 x 5 power. Skin: No rashes, lesions or ulcers Psychiatry: Judgement and insight appear normal. Mood & affect appropriate.     Data Reviewed: I have personally reviewed following labs and imaging studies  CBC: Recent Labs  Lab 05/19/19 0901  05/20/19 0301 05/21/19 0524 05/22/19 0307 05/23/19 0330 05/23/19 0341 05/24/19 0454  WBC 12.7*  --  16.9* 11.9* 9.7 7.4  --  6.8  NEUTROABS 9.2*  --   --   --   --   --   --   --   HGB 10.0*   < > 9.2* 8.3* 8.1* 7.9* 7.8* 8.5*  HCT 34.1*   < > 31.1* 27.6* 27.6* 26.4* 23.0* 28.7*  MCV 108.9*  --  108.0* 107.4* 107.4* 106.5*  --  107.5*  PLT 237  --  204 190 198 197  --  221   < > = values in this interval not displayed.   Basic Metabolic Panel: Recent Labs  Lab 05/19/19 1632  05/20/19 0301 05/21/19 0524 05/22/19 0307 05/23/19 0330 05/23/19 0341 05/24/19 0454  NA  --    < > 141 137 140 140 139 144  K  --    < >  4.8 4.5 4.5 4.3 4.3 4.9  CL  --   --  98 96* 98 99  --  103  CO2  --   --  32 30 30 32  --  32  GLUCOSE  --   --  114* 122* 111* 98  --  95  BUN  --   --  60* 75* 78* 75*  --  69*  CREATININE  --   --  2.93* 3.70* 3.71* 3.41*  --  3.40*  CALCIUM  --   --  8.9 8.6* 8.7* 8.9  --  9.2  MG 1.8  --   --  2.3 2.3 2.3  --  2.2  PHOS 4.1  --   --  4.7* 4.4 3.1  --  2.6   < > = values in this interval not displayed.   Liver Function Tests: Recent Labs  Lab 05/21/19 0524  AST 14*  ALT 10  ALKPHOS 50  BILITOT 0.5  PROT 6.0*  ALBUMIN 2.3*   Cardiac Enzymes: Recent Labs  Lab  05/19/19 0901 05/21/19 0524  TROPONINI <0.03 <0.03   CBG: Recent Labs  Lab 05/19/19 1601 05/20/19 0847  GLUCAP 110* 104*    Recent Results (from the past 240 hour(s))  SARS Coronavirus 2 (CEPHEID- Performed in [facility] hospital lab), Hosp Order     Status: None   Collection Time: 05/19/19  9:35 AM  Result Value Ref Range Status   SARS Coronavirus 2 NEGATIVE NEGATIVE Final    Comment: (NOTE) If result is NEGATIVE SARS-CoV-2 target nucleic acids are NOT DETECTED. The SARS-CoV-2 RNA is generally detectable in upper and lower  respiratory specimens during the acute phase of infection. The lowest  concentration of SARS-CoV-2 viral copies this assay can detect is 250  copies / mL. A negative result does not preclude SARS-CoV-2 infection  and should not be used as the sole basis for treatment or other  patient management decisions.  A negative result may occur with  improper specimen collection / handling, submission of specimen other  than nasopharyngeal swab, presence of viral mutation(s) within the  areas targeted by this assay, and inadequate number of viral copies  (<250 copies / mL). A negative result must be combined with clinical  observations, patient history, and epidemiological information. If result is POSITIVE SARS-CoV-2 target nucleic acids are DETECTED. The SARS-CoV-2 RNA is generally detectable in upper and lower  respiratory specimens dur ing the acute phase of infection.  Positive  results are indicative of active infection with SARS-CoV-2.  Clinical  correlation with patient history and other diagnostic information is  necessary to determine patient infection status.  Positive results do  not rule out bacterial infection or co-infection with other viruses. If result is PRESUMPTIVE POSTIVE SARS-CoV-2 nucleic acids MAY BE PRESENT.   A presumptive positive result was obtained on the submitted specimen  and confirmed on repeat testing.  While 2019 novel  coronavirus  (SARS-CoV-2) nucleic acids may be present in the submitted sample  additional confirmatory testing may be necessary for epidemiological  and / or clinical management purposes  to differentiate between  SARS-CoV-2 and other Sarbecovirus currently known to infect humans.  If clinically indicated additional testing with an alternate test  methodology [phone]) is advised. The SARS-CoV-2 RNA is generally  detectable in upper and lower respiratory sp ecimens during the acute  phase of infection. The expected result is Negative. Fact Sheet for Patients:  StrictlyIdeas.no Fact Sheet for Healthcare Providers: BankingDealers.co.za This  test is not yet approved or cleared by the [place] and has been authorized for detection and/or diagnosis of SARS-CoV-2 by FDA under an Emergency Use Authorization (EUA).  This EUA will remain in effect (meaning this test can be used) for the duration of the COVID-19 declaration under Section 564(b)(1) of the Act, 21 U.S.C. section 360bbb-3(b)(1), unless the authorization is terminated or revoked sooner. Performed at [facility], [place] [address] [place], [place] [address]   Culture, blood (routine x 2)     Status: None   Collection Time: 05/19/19 11:38 AM  Result Value Ref Range Status   Specimen Description BLOOD LEFT ANTECUBITAL  Final   Special Requests   Final    BOTTLES DRAWN AEROBIC AND ANAEROBIC Blood Culture adequate volume   Culture   Final    NO GROWTH 5 DAYS Performed at [facility], [place] [address] [place], [place] [address]    Report Status 05/24/2019 FINAL  Final  Culture, blood (routine x 2)     Status: None   Collection Time: 05/19/19 11:40 AM  Result Value Ref Range Status   Specimen Description BLOOD RIGHT ANTECUBITAL  Final   Special Requests   Final    BOTTLES DRAWN AEROBIC AND ANAEROBIC Blood Culture results may not be optimal due to an inadequate  volume of blood received in culture bottles   Culture   Final    NO GROWTH 5 DAYS Performed at [facility], [place] [address] [place], [place] [address]    Report Status 05/24/2019 FINAL  Final  MRSA PCR Screening     Status: None   Collection Time: 05/19/19  4:20 PM  Result Value Ref Range Status   MRSA by PCR NEGATIVE NEGATIVE Final    Comment:        The GeneXpert MRSA Assay (FDA approved for NASAL specimens only), is one component of a comprehensive MRSA colonization surveillance program. It is not intended to diagnose MRSA infection nor to guide or monitor treatment for MRSA infections. Performed at [facility], [place] [address], [place], [place] [address]   Acid Fast Smear (AFB)     Status: None   Collection Time: 05/21/19  2:08 PM  Result Value Ref Range Status   AFB Specimen Processing Concentration  Final   Acid Fast Smear Negative  Final    Comment: (NOTE) Performed At: [facility] [place], [place] [id number] Rush Farmer MD AY:[id number]    Source (AFB) FLUID  Final    Comment: RIGHT PLEURAL Performed at [facility], [place] [address], [place], [place] [address]   Culture, body fluid-bottle     Status: None (Preliminary result)   Collection Time: 05/21/19  2:08 PM  Result Value Ref Range Status   Specimen Description FLUID  Final   Special Requests RIGHT PLEURAL  Final   Culture   Final    NO GROWTH 3 DAYS Performed at [place] [address] [place], [place] [address]    Report Status PENDING  Incomplete  Gram stain     Status: None   Collection Time: 05/21/19  2:08 PM  Result Value Ref Range Status   Specimen Description FLUID  Final   Special Requests RIGHT PLEURAL  Final   Gram Stain   Final    ABUNDANT WBC PRESENT, PREDOMINANTLY PMN NO ORGANISMS SEEN Performed at [facility], [place] [address], [place], [place] [address]  Report Status 05/21/2019 FINAL  Final         Radiology  Studies: Dg Chest Port 1 View  Result Date: 05/23/2019 CLINICAL DATA:  Right-sided pleural effusion EXAM: PORTABLE CHEST 1 VIEW COMPARISON:  05/21/2019 FINDINGS: Cardiac shadow is stable. Thickening is noted along the minor fissure on the right stable from the prior exam. Mild right basilar atelectasis is seen. No new focal abnormality is noted. No pneumothorax is seen. IMPRESSION: Stable changes on the right. No pneumothorax or sizable effusion is seen. Electronically Signed   By: Inez Catalina M.D.   On: 05/23/2019 07:54        Scheduled Meds: . allopurinol  100 mg Oral Daily  . amiodarone  200 mg Oral Daily  . apixaban  2.5 mg Oral BID  . bisacodyl  10 mg Oral Daily  . bisoprolol  5 mg Oral Daily  . budesonide (PULMICORT) nebulizer solution  0.25 mg Nebulization BID  . Chlorhexidine Gluconate Cloth  6 each Topical Daily  . ferrous sulfate  325 mg Oral Daily  . furosemide  40 mg Oral Daily  . sodium chloride flush  3 mL Intravenous Q12H   Continuous Infusions: . sodium chloride    . ceFEPime (MAXIPIME) IV 2 g (05/24/19 1142)     LOS: 5 days     Vernell Leep, MD, FACP, [facility]. [facility]  To contact the attending provider between 7A-7P or the covering provider during after hours 7P-7A, please log into the web site [email or web address] and access using universal [place] password for that web site. If you do not have the password, please call the hospital operator.  05/24/2019, 3:47 PM

## 2019-05-24 NOTE — Progress Notes (Addendum)
Progress Note  Patient Name: David Barton Date of Encounter: 05/24/2019  Primary Cardiologist: Quay Burow, MD  Subjective   Patient is overall feeling better. Does not feel he has any more swelling than baseline, in fact better. Still wheezy. No CP. Sitting up in chair reading.  Inpatient Medications    Scheduled Meds:  allopurinol  100 mg Oral Daily   amiodarone  200 mg Oral Daily   bisacodyl  10 mg Oral Daily   bisoprolol  5 mg Oral Daily   budesonide (PULMICORT) nebulizer solution  0.25 mg Nebulization BID   Chlorhexidine Gluconate Cloth  6 each Topical Daily   ferrous sulfate  325 mg Oral Daily   sodium chloride flush  3 mL Intravenous Q12H   Continuous Infusions:  sodium chloride     ceFEPime (MAXIPIME) IV 2 g (05/23/19 1144)   ferumoxytol     heparin 1,200 Units/hr (05/24/19 0800)   PRN Meds: sodium chloride, ipratropium-albuterol, nitroGLYCERIN, ondansetron (ZOFRAN) IV, sodium chloride flush   Vital Signs    Vitals:   05/24/19 0500 05/24/19 0800 05/24/19 0826 05/24/19 0855  BP:  (!) 146/84    Pulse: 94 100 (!) 101   Resp: 20 (!) 31 (!) 35   Temp:    97.6 F (36.4 C)  TempSrc:    Oral  SpO2: 95% 97% 98%   Weight:      Height:        Intake/Output Summary (Last 24 hours) at 05/24/2019 0912 Last data filed at 05/24/2019 0800 Gross per 24 hour  Intake 993.51 ml  Output 1150 ml  Net -156.49 ml   Last 3 Weights 05/24/2019 05/23/2019 05/21/2019  Weight (lbs) 227 lb 1.2 oz 226 lb 3.1 oz 226 lb 10.1 oz  Weight (kg) 103 kg 102.6 kg 102.8 kg     Telemetry    Appears to be atrial flutter with HR around 100 - Personally Reviewed  Physical Exam   GEN: No acute distress.  HEENT: Normocephalic, atraumatic, sclera non-icteric. Neck: No JVD or bruits. Cardiac: Reg rhythm, borderline tachycardic, no murmurs, rubs, or gallops.  Radials/DP/PT 1+ and equal bilaterally.  Respiratory: Diffusely wheezy. No rales or rhonchi. Breathing is unlabored. GI:  Soft, nontender, non-distended, BS +x 4. MS: no deformity. Extremities: No clubbing or cyanosis. Trace edema with what appears to be chronic skin thickening.  Neuro:  AAOx3. Follows commands. Psych:  Responds to questions appropriately with a normal affect.  Labs    Chemistry Recent Labs  Lab 05/21/19 0524 05/22/19 0307 05/23/19 0330 05/23/19 0341 05/24/19 0454  NA 137 140 140 139 144  K 4.5 4.5 4.3 4.3 4.9  CL 96* 98 99  --  103  CO2 30 30 32  --  32  GLUCOSE 122* 111* 98  --  95  BUN 75* 78* 75*  --  69*  CREATININE 3.70* 3.71* 3.41*  --  3.40*  CALCIUM 8.6* 8.7* 8.9  --  9.2  PROT 6.0*  --   --   --   --   ALBUMIN 2.3*  --   --   --   --   AST 14*  --   --   --   --   ALT 10  --   --   --   --   ALKPHOS 50  --   --   --   --   BILITOT 0.5  --   --   --   --   [facility]  14* 14* 16*  --  16*  GFRAA 17* 16* 18*  --  18*  ANIONGAP 11 12 9   --  9     Hematology Recent Labs  Lab 05/22/19 0307 05/23/19 0330 05/23/19 0341 05/24/19 0454  WBC 9.7 7.4  --  6.8  RBC 2.57* 2.48*  --  2.67*  HGB 8.1* 7.9* 7.8* 8.5*  HCT 27.6* 26.4* 23.0* 28.7*  MCV 107.4* 106.5*  --  107.5*  MCH 31.5 31.9  --  31.8  MCHC 29.3* 29.9*  --  29.6*  RDW 16.4* 16.3*  --  16.6*  PLT 198 197  --  221    Cardiac Enzymes Recent Labs  Lab 05/19/19 0901 05/21/19 0524  TROPONINI <0.03 <0.03   No results for input(s): TROPIPOC in the last 168 hours.   BNP Recent Labs  Lab 05/19/19 0901  BNP 465.7*     DDimer No results for input(s): DDIMER in the last 168 hours.   Radiology    Dg Chest Port 1 View  Result Date: 05/23/2019 CLINICAL DATA:  Right-sided pleural effusion EXAM: PORTABLE CHEST 1 VIEW COMPARISON:  05/21/2019 FINDINGS: Cardiac shadow is stable. Thickening is noted along the minor fissure on the right stable from the prior exam. Mild right basilar atelectasis is seen. No new focal abnormality is noted. No pneumothorax is seen. IMPRESSION: Stable changes on the right. No  pneumothorax or sizable effusion is seen. Electronically Signed   By: Inez Catalina M.D.   On: 05/23/2019 07:54    Cardiac Studies   2D Echo 05/21/19 IMPRESSIONS  1. The left ventricle has mildly reduced systolic function, with an ejection fraction of 45-50%. The cavity size was normal. There is mildly increased left ventricular wall thickness. Left ventricular diastolic Doppler parameters are indeterminate.  2. LV endocardium difficult to see (No Definity used). EF appears to be ~45-50% with hypokinesis of the inferolateral, inferoseptal and infero-apical myocardium. Consider repeat imaging with Definity to better assess.  3. Left atrial size was moderately dilated.  4. Right atrial size was moderately dilated.  5. Small pericardial effusion.  6. The pericardial effusion is posterior to the left ventricle and circumferential.  7. Mild calcification of the mitral valve leaflet.  8. The aortic valve is tricuspid. Moderate calcification of the aortic valve. No stenosis of the aortic valve.  9. The aortic root and ascending aorta are normal in size and structure. 10. The inferior vena cava was dilated in size with <50% respiratory variability. 11. The interatrial septum was not well visualized.   Patient Profile     85 y.o. male with male with a hx of COPD/emphysema, chronic hypoxic respiratory failure on home O2 (4L/min baseline), CKD stage IV-V, alcohol abuse, h/o presumed NICM in 2017 in the setting of sepsis w/ subsequent normalization of EF in 2018, recent admission for CAP and septic shock 02/2019 w/ hospitalization complicated by new onset atrial fibrillation w/ failed DCCV x 2, now on AAD therapy w/ amiodarone and anticoagulation w/ Xarelto. He was readmitted 05/19/2019 for acute on chronic hypoxic respiratory failure/recurrent PNA. Tested negative for Covid on 5/30, s/p thoracentesis on 6/1. Cardiology consulted for the evaluation of systolic HF/ newly reduced LVF.  Assessment & Plan    1.  Acute on chronic hypoxic respiratory failure with recurrent PNA and right sided pleural effusion - being managed by IM. Pleural effusion suspected exudative, not felt related to CHF.  2. Acute systolic CHF with LV dysfunction/cardiomyopathy - has h/o cardiomyopathy in prior stress  setting in 2017. Given that patient absolutely would not want dialysis, would not pursue ischemic eval at this time (as cath would come with risk of progression of CKD, and nuclear stress test would be made challenging by baseline respiratory status). Guideline directed therapy limited by CKD. Not a candidate for ACEI/ARB/spiro/entresto due to Cr. On Zebeta. BP has been on the softer side - randomly elevated this AM, but would follow before titrating HF med regimen any further. Patient was on Lasix 60mg  at baseline at home, not currently on diuretics in hospital the last few days. Will discuss home regimen recommendation with Dr. Percival Spanish.  3. Persistent atrial fib/flutter - previously attempted DCCV in 02/2019 but did not hold, with recommendation to re-try in 8-12 weeks after amio loading. I agree with Dr. Percival Spanish that if cardioversion were re-attempted in the setting of his acute lung illness, it would be less likely to be successful. Would continue amiodarone for now in anticipation of cardioversion in the future once recovered, understanding that although less ideal in pulmonary disease, his options for other antiarrhythmic therapy are severely limited by his cardiomyopathy and CKD. IM is considering changing heparin to [facility] today. He was on Xarelto 15mg  daily at home. Per UpToDate, For CrCl <15 mL/minute: Avoid use; apixaban or warfarin is more appropriate (AHA/ACC/HRS [January 2014, 5397]). Patients with CrCl <15 mL/minute were excluded from clinical trials (Patel 2011); currently no other clinical data are available to support use in this population (Ha 2019)." I will clarify with Dr. Percival Spanish if plan is to send home on Xarelto  as previously prescribed or consider switch to apixaban.  4. Pericardial effusion - confirmed to be small by echo 6/1. Follow clinically.  Anticipate DC next 24-48 hours after speaking with Dr. Algis Liming. Dr. Percival Spanish - please let me know if you feel this patient would require an in-person visit for his f/u to assess rhythm to decide about going forward with DCCV.  For questions or updates, please contact [place] Please consult [email or web address] for contact info under Cardiology/STEMI.  Signed, Charlie Pitter, PA-C 05/24/2019, 9:12 AM     History and all data above reviewed.  Patient examined.  I agree with the findings as above.  No pain.  Breathing is better and not at baselineThe patient exam reveals QBH:ALPFXTK  ,  Lungs: Decreased breath sounds  ,  Abd: Positive bowel sounds, no rebound no guarding, Ext No edema  .  All available labs, radiology testing, previous records reviewed. Agree with documented assessment and plan.   Acute on chronic systolic and diastolic HF:  Plan to restart low dose PO diuretic.  Unable to otherwise titrate meds.  Atrial flutter:  Continue amiodarone.  Stop heparin and start Eliquis.    Jeneen Rinks Ilan Kahrs  10:34 AM  05/24/2019

## 2019-05-24 NOTE — Progress Notes (Signed)
PT Cancellation Note  Patient Details Name: David Barton MRN: [id number] DOB: May 12, 1933   Cancelled Treatment:    Reason Eval/Treat Not Completed: Other (comment). Attempted x 2. Earlier pt on [facility] and had mobilized with OT. Now pt getting ready to transfer to new room and asked to defer until AM.   [place] 05/24/2019, 3:24 PM [place] Pager [phone] Office [phone]

## 2019-05-24 NOTE — Progress Notes (Signed)
Asked by Dr. Percival Spanish to assure f/u, as he would like patient to be considered for cardioversion in about 2 weeks. Patient had pre-existing f/u with Dr .Gwenlyn Found 6/17 in the office when Dr. Gwenlyn Found is DOD. Will keep in-person appointment at that time (will need EKG and pre-DCCV labs if pursuing DCCV). If for some reason he is still in CIR at that time, let our office know and we can reschedule. Anel Purohit PA-C

## 2019-05-24 NOTE — Progress Notes (Signed)
Pharmacy Antibiotic Note:  Augmentin  David Barton is a 85 y.o. male admitted on 05/19/2019 with acute on chronic respiratory failure. Pharmacy has been consulted for Augmentin dosing to complete at total of 7 days of antibiotic therapy for aspiration pneumonia. Pt has rec'd cefepime 2 grams IV Q 24 hrs since 05/21/19 (4 days).  PMH include: COPD, chronic hypoxic respiratory failure on home oxygen at 4 L/min, chronic systolic CHF, A fib/PSVT with history of TEE cardioversion, HTN, stage IV CKD, alcohol abuse, colon cancer  Pt with acute on stage IV chronic kidney disease; current CrCl ~20 mL/min  Plan: Augmentin 500 mg PO BID X total of 7 doses (to complete 7 day course of antibiotic therapy)  Height: 5\' 10"  (177.8 cm) Weight: 227 lb 1.2 oz (103 kg) IBW/kg (Calculated) : 73  Temp (24hrs), Avg:97.9 F (36.6 C), Min:97.6 F (36.4 C), Max:98.2 F (36.8 C)  Recent Labs  Lab 05/19/19 1140 05/20/19 0301 05/21/19 0524 05/22/19 0307 05/23/19 0330 05/24/19 0454  WBC  --  16.9* 11.9* 9.7 7.4 6.8  CREATININE  --  2.93* 3.70* 3.71* 3.41* 3.40*  LATICACIDVEN 1.4 1.0  --   --   --   --     Estimated Creatinine Clearance: 19.8 mL/min (A) (by C-G formula based on SCr of 3.4 mg/dL (H)).    Allergies  Allergen Reactions  . Flexeril [Cyclobenzaprine] Other (See Comments)    Unknown reaction     Antimicrobials this admission: 6/1 cefepime 2 gm IV Q 24 hrs >> 6/4  Microbiology results: 5/30  COVID: negative 5/30 BCx 2: no growth/final 5/30 MRSA PCR: negative 6/1 AFB cx (R pleural fluid): pending; gram stain: abundant WBCs (predominantly PMNs), NOS 6/1 AFB smear (R pleural fluid): negative  Thank you for allowing pharmacy to be a part of this patient's care.  Gillermina Hu, PharmD, BCPS, [facility] 05/24/2019 4:21 PM

## 2019-05-24 NOTE — Evaluation (Signed)
Occupational Therapy Evaluation Patient Details Name: David Barton MRN: [id number] DOB: 1933-09-01 Today's Date: 05/24/2019    History of Present Illness Pt adm with acute on chronic hypoxic respiratory failure and found to have rt sided effusion. PMH - copd on home O2, afib, htn, ckd, colon CA, etoh abuse   Clinical Impression   Pt PTA: living with spouse, recent admission and reports that he was performing own ADL and mobility. Pt currently performing ambulation in hallway ~100' with RW and minguardA with pt O2 >86% on 4L O2 [place] with activity and required 6L O2 [place] with mobility >89% requires cues for proper technique. Pt requires 30 secs to recover >90% once seated on 4L O2 [place]. Pt set-upA for UB ADL, modA for LB ADL. Pt stood at sink after rest break for light grooming with set-upA. Pt requiring rest breaks with fair activity tolerance and requiring assist for ADL. Pt would benefit from continued OT skilled services for ADL, mobility and safety in CIR setting.       Follow Up Recommendations  CIR(possibly progress to [facility] therapy)    Equipment Recommendations  Other (comment)(to be determined)    Recommendations for Other Services       Precautions / Restrictions Precautions Precautions: Fall;Other (comment) Precaution Comments: watch O2 Restrictions Weight Bearing Restrictions: No      Mobility Bed Mobility Overal bed mobility: Needs Assistance             General bed mobility comments: sitting in recliner   Transfers Overall transfer level: Needs assistance Equipment used: Rolling walker (2 wheeled) Transfers: Sit to/from Stand Sit to Stand: Min assist         General transfer comment: sit to stand from recliner    Balance Overall balance assessment: Needs assistance Sitting-balance support: No upper extremity supported;Feet supported Sitting balance-Leahy Scale: Fair       Standing balance-Leahy Scale: Poor Standing balance comment: walker and min  guard for static standing                           ADL either performed or assessed with clinical judgement   ADL Overall ADL's : Needs assistance/impaired Eating/Feeding: Set up;Sitting   Grooming: Set up;Sitting   Upper Body Bathing: Set up;Sitting   Lower Body Bathing: Moderate assistance;Sitting/lateral leans;Sit to/from stand   Upper Body Dressing : Set up;Sitting   Lower Body Dressing: Moderate assistance;Sitting/lateral leans;Sit to/from stand   Toilet Transfer: Minimal assistance;Ambulation;Regular Toilet;Grab bars   Toileting- Clothing Manipulation and Hygiene: Moderate assistance;Sitting/lateral lean;Sit to/from stand       Functional mobility during ADLs: Min guard;Rolling walker;Cueing for safety General ADL Comments: Pt set-up for UB ADL and modA for LB ADL     Vision Baseline Vision/History: No visual deficits Vision Assessment?: No apparent visual deficits     Perception     Praxis      Pertinent Vitals/Pain       Hand Dominance     Extremity/Trunk Assessment Upper Extremity Assessment Upper Extremity Assessment: Generalized weakness   Lower Extremity Assessment Lower Extremity Assessment: Generalized weakness   Cervical / Trunk Assessment Cervical / Trunk Assessment: Normal   Communication Communication Communication: HOH   Cognition Arousal/Alertness: Awake/alert Behavior During Therapy: WFL for tasks assessed/performed Overall Cognitive Status: Within Functional Limits for tasks assessed  General Comments  Pt ambulating in hallway today with Rw and minguardA. Pt O2 >86% on 4L O2 with activity and required 6L O2 [place] with mobility >89% requires cues for proper technique.    Exercises     Shoulder Instructions      Home Living Family/patient expects to be discharged to:: Private residence Living Arrangements: Spouse/significant other Available Help at Discharge:  Family;Available 24 hours/day Type of Home: House Home Access: Stairs to enter [employer] of Steps: 3-4   Home Layout: One level     Bathroom Shower/Tub: Teacher, early years/pre: Standard Bathroom Accessibility: Yes How Accessible: Accessible via walker Home Equipment: [place] - 2 wheels;Shower seat   Additional Comments: Home O2      Prior Functioning/Environment Level of Independence: Needs assistance  Gait / Transfers Assistance Needed: Per pt he was amb without assistive device. ADL's / Homemaking Assistance Needed: Pt was performing ADL with assist from spouse when tired.            OT Problem List: Decreased strength;Decreased activity tolerance;Impaired balance (sitting and/or standing);Decreased safety awareness;Pain;Decreased coordination      OT Treatment/Interventions: Self-care/ADL training;Therapeutic exercise;Neuromuscular education;Energy conservation;Therapeutic activities;Patient/family education;Balance training    OT Goals(Current goals can be found in the care plan section) Acute Rehab OT Goals Patient Stated Goal: get stronger OT Goal Formulation: With patient Time For Goal Achievement: 06/07/19 Potential to Achieve Goals: Good ADL Goals Pt Will Perform Grooming: with modified independence;standing Pt Will Perform Lower Body Dressing: with supervision;sit to/from stand Pt Will Transfer to Toilet: with modified independence;ambulating;regular height toilet;grab bars Pt Will Perform Toileting - Clothing Manipulation and hygiene: with supervision;sit to/from stand Additional ADL Goal #1: Pt will perform ADL tasks x10 mins standing at sink with set-upA and fair balance.  OT Frequency: Min 2X/week   Barriers to D/C:            Co-evaluation              AM-PAC OT "6 Clicks" Daily Activity     Outcome Measure Help from another person eating meals?: None Help from another person taking care of personal grooming?: A  Little Help from another person toileting, which includes using toliet, bedpan, or urinal?: A Little Help from another person bathing (including washing, rinsing, drying)?: A Lot Help from another person to put on and taking off regular upper body clothing?: A Little Help from another person to put on and taking off regular lower body clothing?: A Lot 6 Click Score: 17   End of Session Equipment Utilized During Treatment: Gait belt;Rolling walker Nurse Communication: Mobility status  Activity Tolerance: Treatment limited secondary to medical complications (Comment) Patient left: in chair;with call bell/phone within reach  OT Visit Diagnosis: Muscle weakness (generalized) (M62.81)                Time: 1610-9604 OT Time Calculation (min): 40 min Charges:  OT General Charges $OT Visit: 1 Visit OT Evaluation $OT Eval Moderate Complexity: 1 Mod OT Treatments $Self Care/Home Management : 8-22 mins $Therapeutic Activity: 8-22 mins  Ebony Hail Harold Hedge) Marsa Aris OTR/L Acute Rehabilitation Services Pager: [phone] Office: [place] 05/24/2019, 8:49 AM

## 2019-05-25 ENCOUNTER — Encounter (HOSPITAL_COMMUNITY): Payer: Self-pay

## 2019-05-25 LAB — BASIC METABOLIC PANEL
Anion gap: 8 (ref 5–15)
BUN: 67 mg/dL — ABNORMAL HIGH (ref 8–23)
CO2: 32 mmol/L (ref 22–32)
Calcium: 9.4 mg/dL (ref 8.9–10.3)
Chloride: 105 mmol/L (ref 98–111)
Creatinine, Ser: 3.25 mg/dL — ABNORMAL HIGH (ref 0.61–1.24)
GFR calc Af Amer: 19 mL/min — ABNORMAL LOW (ref >60)
GFR calc non Af Amer: 17 mL/min — ABNORMAL LOW (ref >60)
Glucose, Bld: 100 mg/dL — ABNORMAL HIGH (ref 70–99)
Potassium: 4.9 mmol/L (ref 3.5–5.1)
Sodium: 145 mmol/L (ref 135–145)

## 2019-05-25 MED ORDER — FUROSEMIDE 40 MG PO TABS
40.0000 mg | ORAL_TABLET | Freq: Two times a day (BID) | ORAL | Status: DC
  Administered 2019-05-25 – 2019-05-30 (×10): 40 mg via ORAL
  Filled 2019-05-25 (×10): qty 1

## 2019-05-25 NOTE — Progress Notes (Signed)
Inpatient Rehab Admissions Coordinator:   I continue to await insurance authorization for possible admission.  Note that I will not be able to admit patient until Monday at this time.    Shann Medal, PT, DPT Admissions Coordinator [phone] 05/25/19  4:15 PM

## 2019-05-25 NOTE — Care Management Important Message (Signed)
Important Message  Patient Details  Name: David Barton MRN: [id number] Date of Birth: 14-May-1933   Medicare Important Message Given:  Yes    Shelda Altes 05/25/2019, 2:25 PM

## 2019-05-25 NOTE — PMR Pre-admission (Addendum)
PMR Admission Coordinator Pre-Admission Assessment  Patient: David Barton is an 85 y.o., male MRN: [id number] DOB: 08/30/1933 Height: 5' 10" (177.8 cm) Weight: 102.2 kg  Insurance Information HMO: yes    PPO:      PCP:      IPA:      80/20:      OTHER:  PRIMARY: UHC Medicare      Policy#: [id number]      Subscriber: patient CM Name: Adonis Huguenin      Phone#: [phone]     Fax#: [phone] Pre-Cert#: [id number] Eugenio Saenz provided by Adonis Huguenin at [facility], with updates due to [place] at [phone] or ([phone]      Employer:  Benefits:  Phone #: [phone]     Name:  Irene Shipper. Date: 12/20/18     Deduct: $0      Out of Pocket Max: $3600 (met $2214.39)      Life Max: n/a CIR: $295/day for days 1-5      SNF: 20 full days Outpatient: $30/visit     Co-Pay: Home Health: 100%      Co-Pay:  DME: 80%     Co-Pay: 20% Providers:  SECONDARY:       Policy#:       Subscriber:  CM Name:       Phone#:      Fax#:  Pre-Cert#:       Employer:  Benefits:  Phone #:      Name:  Eff. Date:      Deduct:       Out of Pocket Max:       Life Max:  CIR:       SNF:  Outpatient:      Co-Pay:  Home Health:       Co-Pay:  DME:      Co-Pay:   Medicaid Application Date:       Case Manager:  Disability Application Date:       Case Worker:   The "Data Collection Information Summary" for patients in Inpatient Rehabilitation Facilities with attached "Privacy Act [place] Records" was provided and verbally reviewed with: Patient and Family  Emergency Contact Information Contact Information    Name Relation Home Work Mobile   [place] Spouse [phone]  [phone]      Current Medical History  Patient Admitting Diagnosis: debility, respiratory failure  History of Present Illness: David Barton is an 85 year old male with history of chronic hypoxic respiratory failure- 4L oxygen dependent, COPD, CKD IV, recent admission for septic shock due to CAP, complicated by onset of  A fib and failed DCCV.  He was readmitted on 05/19/19 with recurrent acute on chronic respiratory failure due to recurrent PNA with marked worsening of RLL collapse with right pleural effusion and new small pericardial effusion. Pt with mild volume overload, working to balance diruetics with pt's chronic kidney disease.  Pt refusing dialysis as well as PM BiPAP.  Therapy evaluations recommended CIR.    CKD IV improving  Patient's medical record from [facility] has been reviewed by the rehabilitation admission coordinator and physician.  Past Medical History  Past Medical History:  Diagnosis Date  . Alcohol abuse   . Colon cancer ([place])   . COPD (chronic obstructive pulmonary disease) ([place])   . Emphysema of lung ([place])   . Former tobacco use   . Hypertension   . Peripheral edema   . Sepsis ([place]) 10/2016   Aspiration PNA/C diff colitis  Family History   family history includes Brain cancer in his father; Colon cancer in his mother; Lung cancer in his brother.  Prior Rehab/Hospitalizations Has the patient had prior rehab or hospitalizations prior to admission? Yes  Has the patient had major surgery during 100 days prior to admission? No   Current Medications  Current Facility-Administered Medications:  .  0.9 %  sodium chloride infusion, 250 mL, Intravenous, PRN, Parrett, Tammy S, NP .  allopurinol (ZYLOPRIM) tablet 100 mg, 100 mg, Oral, Daily, Rizwan, Saima, MD, 100 mg at 05/30/19 1027 .  amiodarone (PACERONE) tablet 200 mg, 200 mg, Oral, Daily, Parrett, Tammy S, NP, 200 mg at 05/30/19 1027 .  apixaban (ELIQUIS) tablet 2.5 mg, 2.5 mg, Oral, BID, Minus Breeding, MD, 2.5 mg at 05/30/19 1027 .  bisacodyl (DULCOLAX) EC tablet 10 mg, 10 mg, Oral, Daily, Vertis Kelch L, RPH, 10 mg at 05/26/19 0930 .  bisoprolol (ZEBETA) tablet 5 mg, 5 mg, Oral, Daily, Parrett, Tammy S, NP, 5 mg at 05/30/19 1027 .  budesonide (PULMICORT) nebulizer solution 0.25 mg, 0.25 mg, Nebulization, BID,  Chase Caller, Murali, MD, 0.25 mg at 05/30/19 0819 .  Chlorhexidine Gluconate Cloth 2 % PADS 6 each, 6 each, Topical, Daily, Debbe Odea, MD, 6 each at 05/29/19 1506 .  ferrous sulfate tablet 325 mg, 325 mg, Oral, Daily, Parrett, Tammy S, NP, 325 mg at 05/30/19 1027 .  furosemide (LASIX) tablet 40 mg, 40 mg, Oral, BID, Minus Breeding, MD, 40 mg at 05/30/19 1027 .  ipratropium-albuterol (DUONEB) 0.5-2.5 (3) MG/3ML nebulizer solution 3 mL, 3 mL, Nebulization, Q6H PRN, Brand Males, MD, 3 mL at 05/30/19 0819 .  nitroGLYCERIN (NITROSTAT) SL tablet 0.4 mg, 0.4 mg, Sublingual, Q5 min PRN, Law, Alexandra M, PA-C, 0.4 mg at 05/19/19 0910 .  ondansetron (ZOFRAN) injection 4 mg, 4 mg, Intravenous, Q6H PRN, Parrett, Tammy S, NP .  sodium chloride flush (NS) 0.9 % injection 3 mL, 3 mL, Intravenous, Q12H, Parrett, Tammy S, NP, 3 mL at 05/30/19 1028 .  sodium chloride flush (NS) 0.9 % injection 3 mL, 3 mL, Intravenous, PRN, Parrett, Tammy S, NP  Patients Current Diet:  Diet Order            Diet regular Room service appropriate? Yes; Fluid consistency: Thin  Diet effective now              Precautions / Restrictions Precautions Precautions: Fall, Other (comment) Precaution Comments: watch O2 Restrictions Weight Bearing Restrictions: No   Has the patient had 2 or more falls or a fall with injury in the past year? Yes  Prior Activity Level Limited Community (1-2x/wk): more limited since his hospital admission in March, not driving, had just stopped using AD  Prior Functional Level Self Care: Did the patient need help bathing, dressing, using the toilet or eating? Needed some help  Indoor Mobility: Did the patient need assistance with walking from room to room (with or without device)? Needed some help  Stairs: Did the patient need assistance with internal or external stairs (with or without device)? Needed some help  Functional Cognition: Did the patient need help planning regular tasks  such as shopping or remembering to take medications? Needed some help  Home Assistive Devices / [place] Devices/Equipment: None Home Equipment: Walker - 2 wheels, Shower seat  Prior Device Use: Indicate devices/aids used by the patient prior to current illness, exacerbation or injury? Walker  Current Functional Level Cognition  Overall Cognitive Status: Within Functional Limits for tasks  assessed Orientation Level: Oriented X4    Extremity Assessment (includes Sensation/Coordination)  Upper Extremity Assessment: Generalized weakness  Lower Extremity Assessment: Generalized weakness    ADLs  Overall ADL's : Needs assistance/impaired Eating/Feeding: Set up, Sitting Grooming: Set up, Sitting Upper Body Bathing: Set up, Sitting Lower Body Bathing: Moderate assistance, Sitting/lateral leans, Sit to/from stand Upper Body Dressing : Set up, Sitting Lower Body Dressing: Moderate assistance, Sitting/lateral leans, Sit to/from stand Toilet Transfer: Min guard, Grab bars, Ambulation, Regular Toilet Toileting- Clothing Manipulation and Hygiene: Minimal assistance, Sit to/from stand Functional mobility during ADLs: Min guard, Rolling walker, Cueing for safety General ADL Comments: Pt set-up for UB ADL and modA for LB ADL    Mobility  Overal bed mobility: Needs Assistance Bed Mobility: Supine to Sit Supine to sit: Min assist General bed mobility comments: sitting in recliner     Transfers  Overall transfer level: Needs assistance Equipment used: Rolling walker (2 wheeled) Transfers: Sit to/from Stand Sit to Stand: Min guard, Supervision General transfer comment: sit to stand from recliner    Ambulation / Gait / Stairs / Emergency planning/management officer  Ambulation/Gait Ambulation/Gait assistance: Counsellor (Feet): 300 Feet Assistive device: Rolling walker (2 wheeled) Gait Pattern/deviations: Step-through pattern, Decreased stride length, Trunk flexed General Gait  Details: Assist for balance and support. Pt was 80% on RA at rest.  Pt amb on 3L down to 85% and 87% on 4L, returns to 95% with rest fairly quickly.  Gait velocity: decr Gait velocity interpretation: <1.8 ft/sec, indicate of risk for recurrent falls    Posture / Balance Balance Overall balance assessment: Needs assistance Sitting-balance support: No upper extremity supported, Feet supported Sitting balance-Leahy Scale: Fair Standing balance support: Bilateral upper extremity supported Standing balance-Leahy Scale: Poor Standing balance comment: walker and min guard for static standing    Special needs/care consideration BiPAP/CPAP refuses CPM  Continuous Drip IV  Dialysis refuses        Days  Life Vest  Oxygen 4L via nasal cannula Special Bed  Trach Size no Wound Vac (area) no      Location n/a Skin MASD to groin Bowel mgmt: last BM 6/4, continent Bladder mgmt: condom cath Diabetic mgmt: no Behavioral consideration no Chemo/radiation no   Previous Home Environment (from acute therapy documentation) Living Arrangements: Spouse/significant other Available Help at Discharge: Family, Available 24 hours/day Type of Home: House Home Layout: One level Home Access: Stairs to enter [employer] of Steps: 3-4 Bathroom Shower/Tub: Chiropodist: Standard Bathroom Accessibility: Yes How Accessible: Accessible via walker Home Care Services: Yes Type of Home Care Services: Other (Comment)(Home Oxygen ) Additional Comments: Home O2  Discharge Living Setting Plans for Discharge Living Setting: Patient's home Type of Home at Discharge: House Discharge Home Layout: One level Discharge Home Access: Stairs to enter Entrance Stairs-Rails: Right, Left Entrance Stairs-Number of Steps: 3 Discharge Bathroom Shower/Tub: Tub/shower unit Discharge Bathroom Toilet: Standard Discharge Bathroom Accessibility: Yes How Accessible: Accessible via walker Does the  patient have any problems obtaining your medications?: No  Social/Family/Support Systems Patient Roles: Spouse Anticipated Caregiver: wife, Archie Patten Anticipated Ambulance person Information: [phone] Ability/Limitations of Caregiver: min assist Caregiver Availability: 24/7 Discharge Plan Discussed with Primary Caregiver: Yes Is Caregiver In Agreement with Plan?: Yes Does Caregiver/Family have Issues with Lodging/Transportation while Pt is in Rehab?: No  Goals/Additional Needs Patient/Family Goal for Rehab: PT/OT supervision to mod I Expected length of stay: 7-10 days Dietary Needs: regular, thin Additional Information: has been unable to tolerate BiPAP  or CPAP, will need outpatient sleep study to determine best course of O2 support at night. Pt does tolerate venturimask at night if needed Pt/Family Agrees to Admission and willing to participate: Yes Program Orientation Provided & Reviewed with Pt/Caregiver Including Roles  & Responsibilities: Yes  Barriers to Discharge: Other (comments)(PIV needs)   Possible need for SNF placement upon discharge: no  Patient Condition: I have reviewed medical records from [facility], spoken with CM, and patient and spouse. I met with patient at the bedside and discussed with spouse via phone for inpatient rehabilitation assessment.  Patient will benefit from ongoing PT and OT, can actively participate in 3 hours of therapy a day 5 days of the week, and can make measurable gains during the admission.  Patient will also benefit from the coordinated team approach during an Inpatient Acute Rehabilitation admission.  The patient will receive intensive therapy as well as Rehabilitation physician, nursing, social worker, and care management interventions.  Due to safety, disease management, medication administration, pain management and patient education the patient requires 24 hour a day rehabilitation nursing.  The patient is currently min assist  with mobility and basic ADLs.  Discharge setting and therapy post discharge at home with home health is anticipated.  Patient has agreed to participate in the Acute Inpatient Rehabilitation Program and will admit today.  Preadmission Screen Completed By:  Michel Santee, PT, DPT 05/30/2019 1:17 PM ______________________________________________________________________   Discussed status with Dr. Letta Pate  on 05/30/19  at 1:17 PM  and received approval for admission today.  Admission Coordinator:  Michel Santee, PT, DPT 1:17 PM Sudie Grumbling 05/30/19    Assessment/Plan: Diagnosis:Debility due to pneumonia with acute on chronic respiratory failure 1. Does the need for close, 24 hr/day Medical supervision in concert with the patient's rehab needs make it unreasonable for this patient to be served in a less intensive setting? Yes 2. Co-Morbidities requiring supervision/potential complications: COPD, Atrial fib 3. Due to bladder management, bowel management, safety, skin/wound care, disease management, medication administration, pain management and patient education, does the patient require 24 hr/day rehab nursing? Yes 4. Does the patient require coordinated care of a physician, rehab nurse, PT (1-2 hrs/day, 5 days/week) and OT (1-2 hrs/day, 5 days/week) to address physical and functional deficits in the context of the above medical diagnosis(es)? Yes Addressing deficits in the following areas: balance, endurance, locomotion, strength, transferring, bowel/bladder control, bathing, dressing, feeding, grooming, toileting and psychosocial support 5. Can the patient actively participate in an intensive therapy program of at least 3 hrs of therapy 5 days a week? Yes 6. The potential for patient to make measurable gains while on inpatient rehab is good 7. Anticipated functional outcomes upon discharge from inpatients are: supervision PT, supervision OT, n/a SLP 8. Estimated rehab length of stay to reach the  above functional goals is: 7-10d 9. Anticipated D/C setting: Home 10. Anticipated post D/C treatments: [place] therapy 11. Overall Rehab/Functional Prognosis: fair  MD Signature: Charlett Blake M.D. [facility] FAAPM&R (Sports Med, Neuromuscular Med) Diplomate Am Board of Electrodiagnostic Med

## 2019-05-25 NOTE — Progress Notes (Signed)
Physical Therapy Treatment Patient Details Name: David Barton MRN: [id number] DOB: Jun 18, 1933 Today's Date: 05/25/2019    History of Present Illness Pt adm with acute on chronic hypoxic respiratory failure and found to have rt sided effusion. PMH - copd on home O2, afib, htn, ckd, colon CA, etoh abuse    PT Comments    Patient doing well with therapy today, ambulating on 2-4Ls, 4Ls remains above 90%. Unsteady and weak, patient says he is far from his baseline and struggles with household ambulation distances, eager to improve his activity tolerance and independence.. Cont to rec CIR.  \    Follow Up Recommendations  CIR     Equipment Recommendations  None recommended by PT    Recommendations for Other Services       Precautions / Restrictions Precautions Precautions: Fall;Other (comment) Precaution Comments: watch O2 Restrictions Weight Bearing Restrictions: No    Mobility  Bed Mobility Overal bed mobility: Needs Assistance             General bed mobility comments: sitting in recliner   Transfers Overall transfer level: Needs assistance Equipment used: Rolling walker (2 wheeled) Transfers: Sit to/from Stand Sit to Stand: Min assist         General transfer comment: sit to stand from recliner  Ambulation/Gait Ambulation/Gait assistance: Min assist Gait Distance (Feet): 150 Feet Assistive device: Rolling walker (2 wheeled) Gait Pattern/deviations: Step-through pattern;Decreased stride length;Trunk flexed;Drifts right/left Gait velocity: decr   General Gait Details: Assist for balance and support. Pt amb on 2L of O2 with SpO2 down to 79%. returns [place] with rest on 4L   Stairs             Wheelchair Mobility    Modified Rankin (Stroke Patients Only)       Balance Overall balance assessment: Needs assistance Sitting-balance support: No upper extremity supported;Feet supported Sitting balance-Leahy Scale: Fair       Standing  balance-Leahy Scale: Poor Standing balance comment: walker and min guard for static standing                            Cognition Arousal/Alertness: Awake/alert Behavior During Therapy: WFL for tasks assessed/performed Overall Cognitive Status: Within Functional Limits for tasks assessed                                        Exercises      General Comments        Pertinent Vitals/Pain      Home Living                      Prior Function            PT Goals (current goals can now be found in the care plan section) Acute Rehab PT Goals Patient Stated Goal: get stronger PT Goal Formulation: With patient Time For Goal Achievement: 06/05/19 Potential to Achieve Goals: Good Progress towards PT goals: Progressing toward goals    Frequency    Min 3X/week      PT Plan Current plan remains appropriate    Co-evaluation              AM-PAC PT "6 Clicks" Mobility   Outcome Measure  Help needed turning from your back to your side while in a flat bed without using bedrails?: A Little Help  needed moving from lying on your back to sitting on the side of a flat bed without using bedrails?: A Little Help needed moving to and from a bed to a chair (including a wheelchair)?: A Little Help needed standing up from a chair using your arms (e.g., wheelchair or bedside chair)?: A Little Help needed to walk in hospital room?: A Little Help needed climbing 3-5 steps with a railing? : A Lot 6 Click Score: 17    End of Session Equipment Utilized During Treatment: Gait belt;Oxygen Activity Tolerance: Patient tolerated treatment well Patient left: in chair;with call bell/phone within reach Nurse Communication: Mobility status PT Visit Diagnosis: Unsteadiness on feet (R26.81);Other abnormalities of gait and mobility (R26.89);Muscle weakness (generalized) (M62.81)     Time: 1730-1750 PT Time Calculation (min) (ACUTE ONLY): 20  min  Charges:  $Gait Training: 8-22 mins                     Reinaldo Berber, PT, DPT Acute Rehabilitation Services Pager: [phone] Office: [phone]     Reinaldo Berber 05/25/2019, 6:15 PM

## 2019-05-25 NOTE — Progress Notes (Signed)
Progress Note  Patient Name: David Barton Date of Encounter: 05/25/2019  Primary Cardiologist:   Quay Burow, MD   Subjective   More SOB this AM.  Had to have his O2 increased to 4 liters.    Inpatient Medications    Scheduled Meds: . allopurinol  100 mg Oral Daily  . amiodarone  200 mg Oral Daily  . amoxicillin-clavulanate  1 tablet Oral Q12H  . apixaban  2.5 mg Oral BID  . bisacodyl  10 mg Oral Daily  . bisoprolol  5 mg Oral Daily  . budesonide (PULMICORT) nebulizer solution  0.25 mg Nebulization BID  . Chlorhexidine Gluconate Cloth  6 each Topical Daily  . ferrous sulfate  325 mg Oral Daily  . furosemide  40 mg Oral Daily  . sodium chloride flush  3 mL Intravenous Q12H   Continuous Infusions: . sodium chloride     PRN Meds: sodium chloride, ipratropium-albuterol, nitroGLYCERIN, ondansetron (ZOFRAN) IV, sodium chloride flush   Vital Signs    Vitals:   05/24/19 2047 05/24/19 2344 05/25/19 0500 05/25/19 0911  BP:  120/78    Pulse: 91 88  (!) 103  Resp: 20   20  Temp:  97.9 F (36.6 C)    TempSrc:  Oral    SpO2: 91% (!) 80%  90%  Weight:   101.5 kg   Height:        Intake/Output Summary (Last 24 hours) at 05/25/2019 0914 Last data filed at 05/25/2019 0500 Gross per 24 hour  Intake 14.58 ml  Output 1700 ml  Net -1685.42 ml   Filed Weights   05/23/19 0204 05/24/19 0455 05/25/19 0500  Weight: 102.6 kg 103 kg 101.5 kg    Telemetry    Atrial flutter with controlled ventricular rate - Personally Reviewed  ECG    NA - Personally Reviewed  Physical Exam   GEN: No acute distress.   Neck: No  JVD Cardiac: RRR, no murmurs, rubs, or gallops.  Respiratory: C to auscultation bilaterally. GI: Soft, nontender, non-distended  MS: No  edema; No deformity. Neuro:  Nonfocal  Psych: Normal affect   Labs    Chemistry Recent Labs  Lab 05/21/19 0524  05/23/19 0330 05/23/19 0341 05/24/19 0454 05/25/19 0533  NA 137   < > 140 139 144 145  K 4.5   < >  4.3 4.3 4.9 4.9  CL 96*   < > 99  --  103 105  CO2 30   < > 32  --  32 32  GLUCOSE 122*   < > 98  --  95 100*  BUN 75*   < > 75*  --  69* 67*  CREATININE 3.70*   < > 3.41*  --  3.40* 3.25*  CALCIUM 8.6*   < > 8.9  --  9.2 9.4  PROT 6.0*  --   --   --   --   --   ALBUMIN 2.3*  --   --   --   --   --   AST 14*  --   --   --   --   --   ALT 10  --   --   --   --   --   ALKPHOS 50  --   --   --   --   --   BILITOT 0.5  --   --   --   --   --   GFRNONAA 14*   < >  16*  --  16* 17*  GFRAA 17*   < > 18*  --  18* 19*  ANIONGAP 11   < > 9  --  9 8   < > = values in this interval not displayed.     Hematology Recent Labs  Lab 05/22/19 0307 05/23/19 0330 05/23/19 0341 05/24/19 0454  WBC 9.7 7.4  --  6.8  RBC 2.57* 2.48*  --  2.67*  HGB 8.1* 7.9* 7.8* 8.5*  HCT 27.6* 26.4* 23.0* 28.7*  MCV 107.4* 106.5*  --  107.5*  MCH 31.5 31.9  --  31.8  MCHC 29.3* 29.9*  --  29.6*  RDW 16.4* 16.3*  --  16.6*  PLT 198 197  --  221    Cardiac Enzymes Recent Labs  Lab 05/19/19 0901 05/21/19 0524  TROPONINI <0.03 <0.03   No results for input(s): TROPIPOC in the last 168 hours.   BNP Recent Labs  Lab 05/19/19 0901  BNP 465.7*     DDimer No results for input(s): DDIMER in the last 168 hours.   Radiology    No results found.  Cardiac Studies   2D Echo 05/21/19 IMPRESSIONS 1. The left ventricle has mildly reduced systolic function, with an ejection fraction of 45-50%. The cavity size was normal. There is mildly increased left ventricular wall thickness. Left ventricular diastolic Doppler parameters are indeterminate. 2. LV endocardium difficult to see (No Definity used). EF appears to be ~45-50% with hypokinesis of the inferolateral, inferoseptal and infero-apical myocardium. Consider repeat imaging with Definity to better assess. 3. Left atrial size was moderately dilated. 4. Right atrial size was moderately dilated. 5. Small pericardial effusion. 6. The pericardial effusion is  posterior to the left ventricle and circumferential. 7. Mild calcification of the mitral valve leaflet. 8. The aortic valve is tricuspid. Moderate calcification of the aortic valve. No stenosis of the aortic valve. 9. The aortic root and ascending aorta are normal in size and structure. 10. The inferior vena cava was dilated in size with <50% respiratory variability. 11. The interatrial septum was not well visualized.    Patient Profile     85 y.o. male with a hx of COPD/emphysema, chronic hypoxic respiratory failure on home O2 (4L/min baseline), CKD stage IV-V, alcohol abuse, h/o presumed NICM in 2017 in the setting of sepsis w/ subsequent normalization of EF in 2018, recent admission for CAP and septic shock 02/2019 w/ hospitalization complicated by new onset atrial fibrillation w/ failed DCCV x 2, now on AAD therapy w/ amiodarone and anticoagulation w/ Xarelto. He was readmitted 05/19/2019 for acute on chronic hypoxic respiratory failure/recurrent PNA. Tested negative for Covid on 5/30, s/p thoracentesis on 6/1. Cardiology consulted for the evaluation of systolic HF/ newly reduced LVF.  Assessment & Plan   ATRIAL FIB/FLUTTER:    Follow up scheduled for office visit prior to considering another attempt at DCCV once he is recovered from a pulmonary standpoint.   He was switched to Eliquis yesterday.    PERICARDIAL EFFUSION:  Small  ACUTE SYSTOLIC HF:   On low dose PO Lasix.   Given increased dyspnea I will increase the PO Lasix slightly today.    CKD IV:  Does not want dialysis.  Creat is stable. Follow on increased Lasix.   ACUTE RESPIRATORY FAILURE:  Pneumonia plus parapneumonic effusion.  Plan per primary team.    For questions or updates, please contact [place] Please consult [email or web address] for contact info under Cardiology/STEMI.   Signed, Minus Breeding, MD  05/25/2019, 9:14 AM

## 2019-05-25 NOTE — Progress Notes (Signed)
PROGRESS NOTE   David Barton  VHQ:[id number]    DOB: 1933-12-15    DOA: 05/19/2019  PCP: Maury Dus, MD   I have briefly reviewed patients previous medical records in [facility].  Brief Narrative:  86 year old male with PMH of COPD, chronic hypoxic respiratory failure on home oxygen at 4 L/min, chronic systolic CHF with EF 41% in 2017 which subsequently normalized, A. fib/PSVT with history of TEE cardioversion, HTN, stage IV CKD, alcohol abuse, colon cancer who initially presented with chest pain and dyspnea.  Recent prolonged hospitalization in March/April for pneumonia, septic shock complicated by A. fib with RVR and worsening renal failure.  In ED developed progressive hypoxia, placed on BiPAP which patient not fully compliant with, DNR, CT chest 5/31 showed new right-sided consolidation with right moderate pleural effusion, high PCT, suspecting H CAP, status post thoracentesis 05/21/2019.  Cardiology consulted 6/3.   Assessment & Plan:   Principal Problem:   Acute on chronic respiratory failure ([name]) Active Problems:   AKI (acute kidney injury) ([place])   Atrial fibrillation with RVR ([name])   HCAP (healthcare-associated pneumonia)   Chronic respiratory failure with hypoxia ([name])   Acute on chronic hypoxic respiratory failure - Suspected due to HCAP and right-sided pleural effusion.  He may have underlying undiagnosed sleep apnea. -IV cefepime switched to Augmentin 6/4 to complete total 7 days treatment.  MRSA PCR negative and vancomycin discontinued. - Blood cultures x2: Negative to date. - SARS coronavirus 2: Negative. - CT chest showed worsening right sided consolidation and effusion and hence underwent thoracentesis, please see discussion below. - After MBS and as per speech therapy recommendation, remains on regular diet and thin liquids with low index of suspicion for aspiration. - Acetylcholine receptor antibody results pending. - On home oxygen 3-4 L/min at baseline.  -Target for oxygen saturation between 88-92%. -Patient declining or unable to tolerate BiPAP more than a short time.  Discussed with Dr. Nelda Marseille, thereby no BiPAP at discharge. -Slightly worsened dyspnea and increased oxygen requirement this morning, Lasix increased, continue nebs.  Follow closely.  Suspected right lower lobe HCAP versus likely aspiration pneumonia -Was on IV cefepime.  Cultures thus far negative. -As discussed with Dr. Nelda Marseille, PCCM, despite negative swallow evaluation, patient has had persistent RLL pneumonia despite multiple antibiotic courses. -Changed cefepime to Augmentin on 6/4 and complete total 7 days course. -Recommend repeating CT chest in 4 to 6 weeks and outpatient follow-up arranged arranged by pulmonology.  Right-sided parapneumonic pleural effusion -Status post ultrasound-guided thoracentesis by IR on 6/1: Total of 1.85 L of dark red fluid was removed. - Pleural fluid: WBCs 1463, 82% neutrophils, protein 3.2, LDH 140, culture negative to date.  Reactive mesothelial cells and macrophages present, no atypia seen. -Suspected exudative?  Parapneumonic. -IV cefepime changed to Augmentin. -Chest x-ray 6/3 without sizable effusion.  Improved. -Pulmonology follow-up appreciated, discussed in detail with Dr. Nelda Marseille.  Management as outlined above.  Acute on stage IV chronic kidney disease -Baseline creatinine reportedly approximately 2.5. -On 6/1 creatinine up to 3.7.  Lasix discontinued.  Creatinine has improved and plateaued in the 3.4 range over the last 2 days.  Creatinine down to 3.2 today. -Patient absolutely does not want dialysis.  Acute systolic CHF/LVEF 32-44%/[id number]. -Last EF was 60-65%. -Clinically appears euvolemic.  Diuretics were temporarily held.  Lasix 40 mg daily restarted. -Cardiology input appreciated.  Discussed with Dr. Percival Spanish 6/3.  Because of renal insufficiency, not a candidate for ACEI or ARB.  He does not think that  congestive heart  failure is the contributor for patient's pleural effusion. -Due to worsening dyspnea and hypoxia, Lasix increased to 40 mg twice daily.  A. fib/flutter with RVR -Failed TEE cardioversion on 03/16/2019. -Xarelto changed to heparin by PCCM for procedures. -Remains on amiodarone and Zebeta. -Cardizem on hold. -As per discussion with cardiology, cardioversion may be less likely to be successful until primary pulmonary processes addressed.   -IV heparin discontinued and started Eliquis due to chronic kidney disease. - Op f/u with Dr. Quay Burow, Cardiology 6/17  Elevated TSH -Possibly sick euthyroid.  Follow TSH in 4 weeks.  Gout -No acute flare presently.  Continue allopurinol prophylaxis.  Morbid obesity/Body mass index is 32.11 kg/m.  Suspected OSA Outpatient evaluation.  As discussed with pulmonology, patient unable to tolerate BiPAP and hence can continue just oxygen via nasal cannula.  Macrocytic anemia Stable.  Getting IV and oral iron.  Physical deconditioning -DC to CIR pending insurance approval and acceptance by CIR. as per CIR coordinator update, insurance approval not back to date and hence they will not be able to admit him to inpatient rehab at least until 6/8.  DVT prophylaxis: IV heparin changed to Eliquis. Code Status: DNR Family Communication: I discussed in detail with patient's spouse 6/4, updated care and answered questions. Disposition: DC to CIR pending acceptance and insurance approval.   Consultants:  PCCM Cardiology IR  Procedures:  Ultrasound guided thoracentesis 6/1  Antimicrobials:  IV cefepime-discontinued 6/4 Oral Augmentin 6/4 >   Subjective: When I saw him this morning patient denied dyspnea.  However subsequently reported dyspnea and noted to have increased oxygen requirement when cardiology saw him.  ROS: As above  Objective:  Vitals:   05/25/19 0500 05/25/19 0911 05/25/19 1043 05/25/19 1713  BP:    120/71  Pulse:  (!) 103  100 97  Resp:  20 18 19   Temp:    97.7 F (36.5 C)  TempSrc:    Oral  SpO2:  90% 90% 95%  Weight: 101.5 kg     Height:        Examination:  General exam: Pleasant elderly male, moderately built and obese sitting up comfortably in chair getting ready to eat his breakfast.  Did not appear in any distress. Respiratory system: Clear to auscultation anteriorly.  Few posterior rhonchi.  No crackles.  Slightly diminished breath sounds in the bases.  No increased work of breathing. Cardiovascular system: S1 & S2 heard, RRR. No JVD, murmurs, rubs, gallops or clicks.  Trace bilateral leg edema.  Telemetry personally reviewed: Sinus rhythm with BBB morphology.  Stable. Gastrointestinal system: Abdomen is nondistended, soft and nontender. No organomegaly or masses felt. Normal bowel sounds heard.  Stable. Central nervous system: Alert and oriented. No focal neurological deficits.  Stable. Extremities: Symmetric 5 x 5 power. Skin: No rashes, lesions or ulcers Psychiatry: Judgement and insight appear normal. Mood & affect appropriate.     Data Reviewed: I have personally reviewed following labs and imaging studies  CBC: Recent Labs  Lab 05/19/19 0901  05/20/19 0301 05/21/19 0524 05/22/19 0307 05/23/19 0330 05/23/19 0341 05/24/19 0454  WBC 12.7*  --  16.9* 11.9* 9.7 7.4  --  6.8  NEUTROABS 9.2*  --   --   --   --   --   --   --   HGB 10.0*   < > 9.2* 8.3* 8.1* 7.9* 7.8* 8.5*  HCT 34.1*   < > 31.1* 27.6* 27.6* 26.4* 23.0* 28.7*  MCV 108.9*  --  108.0* 107.4* 107.4* 106.5*  --  107.5*  PLT 237  --  204 190 198 197  --  221   < > = values in this interval not displayed.   Basic Metabolic Panel: Recent Labs  Lab 05/19/19 1632  05/21/19 0524 05/22/19 0307 05/23/19 0330 05/23/19 0341 05/24/19 0454 05/25/19 0533  NA  --    < > 137 140 140 139 144 145  K  --    < > 4.5 4.5 4.3 4.3 4.9 4.9  CL  --    < > 96* 98 99  --  103 105  CO2  --    < > 30 30 32  --  32 32  GLUCOSE  --    < >  122* 111* 98  --  95 100*  BUN  --    < > 75* 78* 75*  --  69* 67*  CREATININE  --    < > 3.70* 3.71* 3.41*  --  3.40* 3.25*  CALCIUM  --    < > 8.6* 8.7* 8.9  --  9.2 9.4  MG 1.8  --  2.3 2.3 2.3  --  2.2  --   PHOS 4.1  --  4.7* 4.4 3.1  --  2.6  --    < > = values in this interval not displayed.   Liver Function Tests: Recent Labs  Lab 05/21/19 0524  AST 14*  ALT 10  ALKPHOS 50  BILITOT 0.5  PROT 6.0*  ALBUMIN 2.3*   Cardiac Enzymes: Recent Labs  Lab 05/19/19 0901 05/21/19 0524  TROPONINI <0.03 <0.03   CBG: Recent Labs  Lab 05/19/19 1601 05/20/19 0847  GLUCAP 110* 104*    Recent Results (from the past 240 hour(s))  SARS Coronavirus 2 (CEPHEID- Performed in [facility] hospital lab), Hosp Order     Status: None   Collection Time: 05/19/19  9:35 AM  Result Value Ref Range Status   SARS Coronavirus 2 NEGATIVE NEGATIVE Final    Comment: (NOTE) If result is NEGATIVE SARS-CoV-2 target nucleic acids are NOT DETECTED. The SARS-CoV-2 RNA is generally detectable in upper and lower  respiratory specimens during the acute phase of infection. The lowest  concentration of SARS-CoV-2 viral copies this assay can detect is 250  copies / mL. A negative result does not preclude SARS-CoV-2 infection  and should not be used as the sole basis for treatment or other  patient management decisions.  A negative result may occur with  improper specimen collection / handling, submission of specimen other  than nasopharyngeal swab, presence of viral mutation(s) within the  areas targeted by this assay, and inadequate number of viral copies  (<250 copies / mL). A negative result must be combined with clinical  observations, patient history, and epidemiological information. If result is POSITIVE SARS-CoV-2 target nucleic acids are DETECTED. The SARS-CoV-2 RNA is generally detectable in upper and lower  respiratory specimens dur ing the acute phase of infection.  Positive  results are  indicative of active infection with SARS-CoV-2.  Clinical  correlation with patient history and other diagnostic information is  necessary to determine patient infection status.  Positive results do  not rule out bacterial infection or co-infection with other viruses. If result is PRESUMPTIVE POSTIVE SARS-CoV-2 nucleic acids MAY BE PRESENT.   A presumptive positive result was obtained on the submitted specimen  and confirmed on repeat testing.  While 2019 novel coronavirus  (SARS-CoV-2) nucleic acids may be present in  the submitted sample  additional confirmatory testing may be necessary for epidemiological  and / or clinical management purposes  to differentiate between  SARS-CoV-2 and other Sarbecovirus currently known to infect humans.  If clinically indicated additional testing with an alternate test  methodology [phone]) is advised. The SARS-CoV-2 RNA is generally  detectable in upper and lower respiratory sp ecimens during the acute  phase of infection. The expected result is Negative. Fact Sheet for Patients:  StrictlyIdeas.no Fact Sheet for Healthcare Providers: BankingDealers.co.za This test is not yet approved or cleared by the [place] FDA and has been authorized for detection and/or diagnosis of SARS-CoV-2 by FDA under an Emergency Use Authorization (EUA).  This EUA will remain in effect (meaning this test can be used) for the duration of the COVID-19 declaration under Section 564(b)(1) of the Act, 21 U.S.C. section 360bbb-3(b)(1), unless the authorization is terminated or revoked sooner. Performed at [facility], [place] [address], [place], [place] [address]   Culture, blood (routine x 2)     Status: None   Collection Time: 05/19/19 11:38 AM  Result Value Ref Range Status   Specimen Description BLOOD LEFT ANTECUBITAL  Final   Special Requests   Final    BOTTLES DRAWN AEROBIC AND ANAEROBIC Blood Culture adequate  volume   Culture   Final    NO GROWTH 5 DAYS Performed at [facility], [place] [address], [place], [place] [address]    Report Status 05/24/2019 FINAL  Final  Culture, blood (routine x 2)     Status: None   Collection Time: 05/19/19 11:40 AM  Result Value Ref Range Status   Specimen Description BLOOD RIGHT ANTECUBITAL  Final   Special Requests   Final    BOTTLES DRAWN AEROBIC AND ANAEROBIC Blood Culture results may not be optimal due to an inadequate volume of blood received in culture bottles   Culture   Final    NO GROWTH 5 DAYS Performed at [facility], [place] [address], [place], [place] [address]    Report Status 05/24/2019 FINAL  Final  MRSA PCR Screening     Status: None   Collection Time: 05/19/19  4:20 PM  Result Value Ref Range Status   MRSA by PCR NEGATIVE NEGATIVE Final    Comment:        The GeneXpert MRSA Assay (FDA approved for NASAL specimens only), is one component of a comprehensive MRSA colonization surveillance program. It is not intended to diagnose MRSA infection nor to guide or monitor treatment for MRSA infections. Performed at [facility], [place] [address] [place], [place] [address]   Acid Fast Smear (AFB)     Status: None   Collection Time: 05/21/19  2:08 PM  Result Value Ref Range Status   AFB Specimen Processing Concentration  Final   Acid Fast Smear Negative  Final    Comment: (NOTE) Performed At: [facility] [place], [place] [id number] Rush Farmer MD [id number]    Source (AFB) FLUID  Final    Comment: RIGHT PLEURAL Performed at [facility], [place] [address] [place], [place] [address]   Culture, body fluid-bottle     Status: None (Preliminary result)   Collection Time: 05/21/19  2:08 PM  Result Value Ref Range Status   Specimen Description FLUID  Final   Special Requests RIGHT PLEURAL  Final   Culture   Final    NO GROWTH 4 DAYS Performed at [facility], [place] [address]  [place], [place] [address]    Report Status PENDING  Incomplete  Gram stain     Status: None   Collection Time: 05/21/19  2:08 PM  Result Value Ref Range Status   Specimen Description FLUID  Final   Special Requests RIGHT PLEURAL  Final   Gram Stain   Final    ABUNDANT WBC PRESENT, PREDOMINANTLY PMN NO ORGANISMS SEEN Performed at [facility], [place] [address] [place],  [address]    Report Status 05/21/2019 FINAL  Final         Radiology Studies: No results found.      Scheduled Meds: . allopurinol  100 mg Oral Daily  . amiodarone  200 mg Oral Daily  . amoxicillin-clavulanate  1 tablet Oral Q12H  . apixaban  2.5 mg Oral BID  . bisacodyl  10 mg Oral Daily  . bisoprolol  5 mg Oral Daily  . budesonide (PULMICORT) nebulizer solution  0.25 mg Nebulization BID  . Chlorhexidine Gluconate Cloth  6 each Topical Daily  . ferrous sulfate  325 mg Oral Daily  . furosemide  40 mg Oral BID  . sodium chloride flush  3 mL Intravenous Q12H   Continuous Infusions: . sodium chloride       LOS: 6 days     Vernell Leep, MD, FACP, [facility]. [facility]  To contact the attending provider between 7A-7P or the covering provider during after hours 7P-7A, please log into the web site [email or web address] and access using universal [place] password for that web site. If you do not have the password, please call the hospital operator.  05/25/2019, 6:06 PM

## 2019-05-26 ENCOUNTER — Inpatient Hospital Stay (HOSPITAL_COMMUNITY): Payer: Medicare Other | Admitting: Occupational Therapy

## 2019-05-26 ENCOUNTER — Inpatient Hospital Stay (HOSPITAL_COMMUNITY): Payer: Medicare Other

## 2019-05-26 ENCOUNTER — Encounter (HOSPITAL_COMMUNITY): Payer: Self-pay

## 2019-05-26 DIAGNOSIS — N184 Chronic kidney disease, stage 4 (severe): Secondary | ICD-10-CM

## 2019-05-26 DIAGNOSIS — I483 Typical atrial flutter: Secondary | ICD-10-CM

## 2019-05-26 DIAGNOSIS — I428 Other cardiomyopathies: Secondary | ICD-10-CM

## 2019-05-26 LAB — BASIC METABOLIC PANEL
Anion gap: 9 (ref 5–15)
BUN: 65 mg/dL — ABNORMAL HIGH (ref 8–23)
CO2: 32 mmol/L (ref 22–32)
Calcium: 9.5 mg/dL (ref 8.9–10.3)
Chloride: 104 mmol/L (ref 98–111)
Creatinine, Ser: 3.14 mg/dL — ABNORMAL HIGH (ref 0.61–1.24)
GFR calc Af Amer: 20 mL/min — ABNORMAL LOW (ref >60)
GFR calc non Af Amer: 17 mL/min — ABNORMAL LOW (ref >60)
Glucose, Bld: 100 mg/dL — ABNORMAL HIGH (ref 70–99)
Potassium: 4.8 mmol/L (ref 3.5–5.1)
Sodium: 145 mmol/L (ref 135–145)

## 2019-05-26 LAB — CULTURE, BODY FLUID W GRAM STAIN -BOTTLE: Culture: NO GROWTH

## 2019-05-26 NOTE — Progress Notes (Signed)
Progress Note  Patient Name: David Barton Date of Encounter: 05/26/2019  Primary Cardiologist: Quay Burow, MD  Subjective   Reports breathing improved in general.  No chest pain or palpitations.  Still has oxygen desaturations at nighttime while sleeping.  States that he has not tolerated CPAP treatments when attempted.  Inpatient Medications    Scheduled Meds: . allopurinol  100 mg Oral Daily  . amiodarone  200 mg Oral Daily  . amoxicillin-clavulanate  1 tablet Oral Q12H  . apixaban  2.5 mg Oral BID  . bisacodyl  10 mg Oral Daily  . bisoprolol  5 mg Oral Daily  . budesonide (PULMICORT) nebulizer solution  0.25 mg Nebulization BID  . Chlorhexidine Gluconate Cloth  6 each Topical Daily  . ferrous sulfate  325 mg Oral Daily  . furosemide  40 mg Oral BID  . sodium chloride flush  3 mL Intravenous Q12H   Continuous Infusions: . sodium chloride     PRN Meds: sodium chloride, ipratropium-albuterol, nitroGLYCERIN, ondansetron (ZOFRAN) IV, sodium chloride flush   Vital Signs    Vitals:   05/25/19 1043 05/25/19 1713 05/25/19 2206 05/26/19 0332  BP:  120/71    Pulse: 100 97    Resp: 18 19    Temp:  97.7 F (36.5 C)    TempSrc:  Oral    SpO2: 90% 95% 90%   Weight:    101.5 kg  Height:        Intake/Output Summary (Last 24 hours) at 05/26/2019 0827 Last data filed at 05/25/2019 2157 Gross per 24 hour  Intake -  Output 850 ml  Net -850 ml   Filed Weights   05/24/19 0455 05/25/19 0500 05/26/19 0332  Weight: 103 kg 101.5 kg 101.5 kg    Telemetry    Probable atrial flutter with variable conduction and controlled heart rate.  Personally reviewed.  Physical Exam   GEN:  Elderly male.  No acute distress.   Neck: No JVD. Cardiac:  Irregular, no gallop.  Respiratory: Nonlabored.  Decreased breath sounds with rhonchi, prolonged expiratory phase. GI: Soft, nontender, bowel sounds present. MS:  Bilateral mild lower leg edema; No deformity. Neuro:  Nonfocal. Psych:  Alert and oriented x 3. Normal affect.  Labs    Chemistry Recent Labs  Lab 05/21/19 0524  05/24/19 0454 05/25/19 0533 05/26/19 0546  NA 137   < > 144 145 145  K 4.5   < > 4.9 4.9 4.8  CL 96*   < > 103 105 104  CO2 30   < > 32 32 32  GLUCOSE 122*   < > 95 100* 100*  BUN 75*   < > 69* 67* 65*  CREATININE 3.70*   < > 3.40* 3.25* 3.14*  CALCIUM 8.6*   < > 9.2 9.4 9.5  PROT 6.0*  --   --   --   --   ALBUMIN 2.3*  --   --   --   --   AST 14*  --   --   --   --   ALT 10  --   --   --   --   ALKPHOS 50  --   --   --   --   BILITOT 0.5  --   --   --   --   GFRNONAA 14*   < > 16* 17* 17*  GFRAA 17*   < > 18* 19* 20*  ANIONGAP 11   < >  9 8 9    < > = values in this interval not displayed.     Hematology Recent Labs  Lab 05/22/19 0307 05/23/19 0330 05/23/19 0341 05/24/19 0454  WBC 9.7 7.4  --  6.8  RBC 2.57* 2.48*  --  2.67*  HGB 8.1* 7.9* 7.8* 8.5*  HCT 27.6* 26.4* 23.0* 28.7*  MCV 107.4* 106.5*  --  107.5*  MCH 31.5 31.9  --  31.8  MCHC 29.3* 29.9*  --  29.6*  RDW 16.4* 16.3*  --  16.6*  PLT 198 197  --  221    Cardiac Enzymes Recent Labs  Lab 05/19/19 0901 05/21/19 0524  TROPONINI <0.03 <0.03   No results for input(s): TROPIPOC in the last 168 hours.   BNP Recent Labs  Lab 05/19/19 0901  BNP 465.7*     Radiology    No results found.  Cardiac Studies   Echocardiogram 05/21/2019:  1. The left ventricle has mildly reduced systolic function, with an ejection fraction of 45-50%. The cavity size was normal. There is mildly increased left ventricular wall thickness. Left ventricular diastolic Doppler parameters are indeterminate.  2. LV endocardium difficult to see (No Definity used). EF appears to be ~45-50% with hypokinesis of the inferolateral, inferoseptal and infero-apical myocardium. Consider repeat imaging with Definity to better assess.  3. Left atrial size was moderately dilated.  4. Right atrial size was moderately dilated.  5. Small pericardial  effusion.  6. The pericardial effusion is posterior to the left ventricle and circumferential.  7. Mild calcification of the mitral valve leaflet.  8. The aortic valve is tricuspid. Moderate calcification of the aortic valve. No stenosis of the aortic valve.  9. The aortic root and ascending aorta are normal in size and structure. 10. The inferior vena cava was dilated in size with <50% respiratory variability. 11. The interatrial septum was not well visualized.  Patient Profile     85 y.o. male with COPD and chronic hypoxic respiratory failure, CKD, nonischemic cardiomyopathy with prior normalization of LVEF, persistent atrial fibrillation/flutter status post failed cardioversion and community-acquired pneumonia.  Assessment & Plan    1.  Acute systolic heart failure with mildly reduced LVEF in the range of 45 to 50%.  He has a prior history of nonischemic cardiomyopathy.  Troponin I levels are not consistent with ACS.  Currently on Zebeta and Lasix with reasonable diuresis.  2.  Persistent atrial flutter, heart rate controlled.  He is on Eliquis and amiodarone.  Do not plan a repeat attempt at cardioversion at this time, can be reconsidered when more stable as an outpatient.  3.  CKD stage IV, current creatinine 3.14 with downward trend.  4.  HCAP versus aspiration pneumonia with right-sided parapneumonic pleural effusion.  He has associated acute on chronic hypoxic respiratory failure.  Treatment continues per primary team.  Continue Zebeta and current dose of Lasix.  Systolic blood pressure 657-903 range, would not push too aggressively on further afterload reduction at this time until he is more stable.  Not candidate for ARB/ANRI/Aldactone.  Otherwise continues on renally dosed Eliquis and amiodarone.  His heart rate is adequately controlled in atrial flutter.  Signed, Rozann Lesches, MD  05/26/2019, 8:27 AM

## 2019-05-26 NOTE — Progress Notes (Signed)
Physical Therapy Treatment Patient Details Name: David Barton MRN: [id number] DOB: [birth year] Today's Date: 05/26/2019    History of Present Illness Pt adm with acute on chronic hypoxic respiratory failure and found to have rt sided effusion. PMH - copd on home O2, afib, htn, ckd, colon CA, etoh abuse    PT Comments    Patient progressing today, now contact guard for short distance gait and use of RW. Desat to 83% on 3L, returns quickly with rest. Cont to rec CIR at this time.    Follow Up Recommendations  CIR     Equipment Recommendations  None recommended by PT    Recommendations for Other Services       Precautions / Restrictions Precautions Precautions: Fall;Other (comment) Precaution Comments: watch O2 Restrictions Weight Bearing Restrictions: No    Mobility  Bed Mobility Overal bed mobility: Needs Assistance             General bed mobility comments: sitting in recliner   Transfers Overall transfer level: Needs assistance Equipment used: Rolling walker (2 wheeled) Transfers: Sit to/from Stand Sit to Stand: Min assist         General transfer comment: sit to stand from recliner  Ambulation/Gait Ambulation/Gait assistance: Min assist Gait Distance (Feet): 160 Feet Assistive device: Rolling walker (2 wheeled) Gait Pattern/deviations: Step-through pattern;Decreased stride length;Trunk flexed;Drifts right/left Gait velocity: decr   General Gait Details: Assist for balance and support. Pt amb on 3L down to 80%, returns to 95% with rest   Stairs             Wheelchair Mobility    Modified Rankin (Stroke Patients Only)       Balance Overall balance assessment: Needs assistance Sitting-balance support: No upper extremity supported;Feet supported Sitting balance-Leahy Scale: Fair       Standing balance-Leahy Scale: Poor Standing balance comment: walker and min guard for static standing                             Cognition Arousal/Alertness: Awake/alert Behavior During Therapy: WFL for tasks assessed/performed Overall Cognitive Status: Within Functional Limits for tasks assessed                                        Exercises      General Comments        Pertinent Vitals/Pain      Home Living                      Prior Function            PT Goals (current goals can now be found in the care plan section) Acute Rehab PT Goals Patient Stated Goal: get stronger PT Goal Formulation: With patient Time For Goal Achievement: 06/05/19 Potential to Achieve Goals: Good    Frequency    Min 3X/week      PT Plan Current plan remains appropriate    Co-evaluation              AM-PAC PT "6 Clicks" Mobility   Outcome Measure  Help needed turning from your back to your side while in a flat bed without using bedrails?: A Little Help needed moving from lying on your back to sitting on the side of a flat bed without using bedrails?: A Little Help needed moving to and  from a bed to a chair (including a wheelchair)?: A Little Help needed standing up from a chair using your arms (e.g., wheelchair or bedside chair)?: A Little Help needed to walk in hospital room?: A Little Help needed climbing 3-5 steps with a railing? : A Lot 6 Click Score: 17    End of Session Equipment Utilized During Treatment: Gait belt;Oxygen Activity Tolerance: Patient tolerated treatment well Patient left: in chair;with call bell/phone within reach Nurse Communication: Mobility status PT Visit Diagnosis: Unsteadiness on feet (R26.81);Other abnormalities of gait and mobility (R26.89);Muscle weakness (generalized) (M62.81)     Time: 1720-1740 PT Time Calculation (min) (ACUTE ONLY): 20 min  Charges:  $Gait Training: 8-22 mins                     Reinaldo Berber, PT, DPT Acute Rehabilitation Services Pager: [phone] Office: [phone]     Reinaldo Berber 05/26/2019, 5:39  PM

## 2019-05-26 NOTE — Progress Notes (Signed)
PROGRESS NOTE   David Barton  ZOX:[id number]    DOB: 1933/08/17    DOA: 05/19/2019  PCP: Maury Dus, MD   I have briefly reviewed patients previous medical records in [facility].  Brief Narrative:  85 year old male with PMH of COPD, chronic hypoxic respiratory failure on home oxygen at 4 L/min, chronic systolic CHF with EF 81% in 2017 which subsequently normalized, A. fib/PSVT with history of TEE cardioversion, HTN, stage IV CKD, alcohol abuse, colon cancer who initially presented with chest pain and dyspnea.  Recent prolonged hospitalization in March/April for pneumonia, septic shock complicated by A. fib with RVR and worsening renal failure.  In ED developed progressive hypoxia, placed on BiPAP which patient not fully compliant with, DNR, CT chest 5/31 showed new right-sided consolidation with right moderate pleural effusion, high PCT, suspecting H CAP, status post thoracentesis 05/21/2019.  Cardiology consulted 6/3.  Medically stable for discharge to CIR pending insurance approval, hopefully 6/8.   Assessment & Plan:   Principal Problem:   Acute on chronic respiratory failure ([name]) Active Problems:   AKI (acute kidney injury) ([place])   Atrial fibrillation with RVR ([name])   HCAP (healthcare-associated pneumonia)   Chronic respiratory failure with hypoxia ([name])   Acute on chronic hypoxic respiratory failure - Suspected due to HCAP and right-sided pleural effusion.  He may have underlying undiagnosed sleep apnea. -IV cefepime switched to Augmentin 6/4 to complete total 7 days treatment.  MRSA PCR negative and vancomycin discontinued. - Blood cultures x2: Negative to date. - SARS coronavirus 2: Negative. - CT chest showed worsening right sided consolidation and effusion and hence underwent thoracentesis, please see discussion below. - After MBS and as per speech therapy recommendation, remains on regular diet and thin liquids with low index of suspicion for aspiration. -  Acetylcholine receptor antibody results pending. - On home oxygen 3-4 L/min at baseline. -Target for oxygen saturation between 88-92%. -Patient declining or unable to tolerate BiPAP more than a short time.  Discussed with Dr. Nelda Marseille, thereby no BiPAP at discharge. -Improved and stable.  Down to oxygen 3 L/min via nasal cannula.  Suspected right lower lobe HCAP versus likely aspiration pneumonia -Was on IV cefepime.  Cultures thus far negative. -As discussed with Dr. Nelda Marseille, PCCM, despite negative swallow evaluation, patient has had persistent RLL pneumonia despite multiple antibiotic courses. -Changed cefepime to Augmentin on 6/4 and complete total 7 days course. -Recommend repeating CT chest in 4 to 6 weeks and outpatient follow-up arranged arranged by pulmonology.  Right-sided parapneumonic pleural effusion -Status post ultrasound-guided thoracentesis by IR on 6/1: Total of 1.85 L of dark red fluid was removed. - Pleural fluid: WBCs 1463, 82% neutrophils, protein 3.2, LDH 140, culture negative to date.  Reactive mesothelial cells and macrophages present, no atypia seen. -Suspected exudative?  Parapneumonic. -IV cefepime changed to Augmentin. -Chest x-ray 6/3 without sizable effusion.  Improved. -Pulmonology follow-up appreciated, discussed in detail with Dr. Nelda Marseille.  Management as outlined above.  Acute on stage IV chronic kidney disease -Baseline creatinine reportedly approximately 2.5. -On 6/1 creatinine up to 3.7.  Lasix discontinued.  Creatinine continues to gradually improve, down to 3.14 today. -Patient absolutely does not want dialysis.  Acute systolic CHF/LVEF 19-14%/[id number]. -Last EF was 60-65%. -Clinically appears euvolemic.  Diuretics were temporarily held.  Lasix 40 mg daily restarted. -Not a candidate for ACEI/ARB/Aldactone due to CKD.  CHF not felt to be primary etiology for patient's pleural effusion. -Improved and stable.  Continue current dose of Lasix and  Zebeta.  A. fib/flutter with RVR -Failed TEE cardioversion on 03/16/2019. -Remains on amiodarone and Zebeta. -Cardizem discontinued -As per discussion with cardiology, cardioversion may be less likely to be successful until primary pulmonary processes addressed.   -IV heparin discontinued and started Eliquis due to chronic kidney disease. - Op f/u with Dr. Quay Burow, Cardiology 6/17  Elevated TSH -Possibly sick euthyroid.  Follow TSH in 4 weeks.  Gout -No acute flare presently.  Continue allopurinol prophylaxis.  Morbid obesity/Body mass index is 32.11 kg/m.  Suspected OSA Outpatient evaluation.  As discussed with pulmonology, patient unable to tolerate BiPAP and hence can continue just oxygen via nasal cannula.  Ongoing issues with desaturations while asleep.  Hopefully can be evaluated as outpatient and be able to use a CPAP device that he can tolerate.  Macrocytic anemia Stable.  Received IV iron.  Continue oral iron.  Physical deconditioning -DC to CIR pending insurance approval and acceptance by CIR. as per CIR coordinator update, insurance approval not back to date and hence they will not be able to admit him to inpatient rehab at least until 6/8.  DVT prophylaxis: IV heparin changed to Eliquis. Code Status: DNR Family Communication: I discussed in detail with patient's spouse 6/4, updated care and answered questions. Disposition: DC to CIR pending acceptance and insurance approval, possibly 6/8.   Consultants:  PCCM Cardiology IR  Procedures:  Ultrasound guided thoracentesis 6/1  Antimicrobials:  IV cefepime-discontinued 6/4 Oral Augmentin 6/4 >   Subjective: Patient states that he had a very brief "3 seconds" dyspnea with oxygen saturation of "88%" yesterday when he got up from his bed to sit on the chair otherwise did not really have any dyspnea.  Also frustrated that he is woken up at night from deep sleep because his oxygen saturations are low.   Currently denies dyspnea or chest pain.  Has been using flutter valve well.  Some cough.  ROS: As above  Objective:  Vitals:   05/25/19 2206 05/26/19 0332 05/26/19 0916 05/26/19 0928  BP:    131/67  Pulse:    (!) 106  Resp:    18  Temp:    97.9 F (36.6 C)  TempSrc:    Oral  SpO2: 90%  93% 92%  Weight:  101.5 kg    Height:        Examination:  General exam: Pleasant elderly male, moderately built and obese sitting up comfortably in chair. Respiratory system: Distant breath sounds but clear to auscultation without wheezing, rhonchi or crackles. Cardiovascular system: S1 & S2 heard, RRR. No JVD, murmurs, rubs, gallops or clicks.  Telemetry personally reviewed: Atrial flutter with controlled ventricular rate alternating with sinus rhythm. Gastrointestinal system: Abdomen is nondistended, soft and nontender. No organomegaly or masses felt. Normal bowel sounds heard.  Stable. Central nervous system: Alert and oriented. No focal neurological deficits.  Stable. Extremities: Symmetric 5 x 5 power. Skin: No rashes, lesions or ulcers Psychiatry: Judgement and insight appear normal. Mood & affect appropriate.     Data Reviewed: I have personally reviewed following labs and imaging studies  CBC: Recent Labs  Lab 05/20/19 0301 05/21/19 0524 05/22/19 0307 05/23/19 0330 05/23/19 0341 05/24/19 0454  WBC 16.9* 11.9* 9.7 7.4  --  6.8  HGB 9.2* 8.3* 8.1* 7.9* 7.8* 8.5*  HCT 31.1* 27.6* 27.6* 26.4* 23.0* 28.7*  MCV 108.0* 107.4* 107.4* 106.5*  --  107.5*  PLT 204 190 198 197  --  462   Basic Metabolic Panel: Recent Labs  Lab 05/19/19 1632  05/21/19 0524 05/22/19 1884 05/23/19 0330 05/23/19 0341 05/24/19 0454 05/25/19 0533 05/26/19 0546  NA  --    < > 137 140 140 139 144 145 145  K  --    < > 4.5 4.5 4.3 4.3 4.9 4.9 4.8  CL  --    < > 96* 98 99  --  103 105 104  CO2  --    < > 30 30 32  --  32 32 32  GLUCOSE  --    < > 122* 111* 98  --  95 100* 100*  BUN  --    < > 75* 78*  75*  --  69* 67* 65*  CREATININE  --    < > 3.70* 3.71* 3.41*  --  3.40* 3.25* 3.14*  CALCIUM  --    < > 8.6* 8.7* 8.9  --  9.2 9.4 9.5  MG 1.8  --  2.3 2.3 2.3  --  2.2  --   --   PHOS 4.1  --  4.7* 4.4 3.1  --  2.6  --   --    < > = values in this interval not displayed.   Liver Function Tests: Recent Labs  Lab 05/21/19 0524  AST 14*  ALT 10  ALKPHOS 50  BILITOT 0.5  PROT 6.0*  ALBUMIN 2.3*   Cardiac Enzymes: Recent Labs  Lab 05/21/19 0524  TROPONINI <0.03   CBG: Recent Labs  Lab 05/19/19 1601 05/20/19 0847  GLUCAP 110* 104*    Recent Results (from the past 240 hour(s))  SARS Coronavirus 2 (CEPHEID- Performed in [place] hospital lab), Hosp Order     Status: None   Collection Time: 05/19/19  9:35 AM  Result Value Ref Range Status   SARS Coronavirus 2 NEGATIVE NEGATIVE Final    Comment: (NOTE) If result is NEGATIVE SARS-CoV-2 target nucleic acids are NOT DETECTED. The SARS-CoV-2 RNA is generally detectable in upper and lower  respiratory specimens during the acute phase of infection. The lowest  concentration of SARS-CoV-2 viral copies this assay can detect is 250  copies / mL. A negative result does not preclude SARS-CoV-2 infection  and should not be used as the sole basis for treatment or other  patient management decisions.  A negative result may occur with  improper specimen collection / handling, submission of specimen other  than nasopharyngeal swab, presence of viral mutation(s) within the  areas targeted by this assay, and inadequate number of viral copies  (<250 copies / mL). A negative result must be combined with clinical  observations, patient history, and epidemiological information. If result is POSITIVE SARS-CoV-2 target nucleic acids are DETECTED. The SARS-CoV-2 RNA is generally detectable in upper and lower  respiratory specimens dur ing the acute phase of infection.  Positive  results are indicative of active infection with SARS-CoV-2.   Clinical  correlation with patient history and other diagnostic information is  necessary to determine patient infection status.  Positive results do  not rule out bacterial infection or co-infection with other viruses. If result is PRESUMPTIVE POSTIVE SARS-CoV-2 nucleic acids MAY BE PRESENT.   A presumptive positive result was obtained on the submitted specimen  and confirmed on repeat testing.  While 2019 novel coronavirus  (SARS-CoV-2) nucleic acids may be present in the submitted sample  additional confirmatory testing may be necessary for epidemiological  and / or clinical management purposes  to differentiate between  SARS-CoV-2 and other Sarbecovirus  currently known to infect humans.  If clinically indicated additional testing with an alternate test  methodology [phone]) is advised. The SARS-CoV-2 RNA is generally  detectable in upper and lower respiratory sp ecimens during the acute  phase of infection. The expected result is Negative. Fact Sheet for Patients:  StrictlyIdeas.no Fact Sheet for Healthcare Providers: BankingDealers.co.za This test is not yet approved or cleared by the [place] FDA and has been authorized for detection and/or diagnosis of SARS-CoV-2 by FDA under an Emergency Use Authorization (EUA).  This EUA will remain in effect (meaning this test can be used) for the duration of the COVID-19 declaration under Section 564(b)(1) of the Act, 21 U.S.C. section 360bbb-3(b)(1), unless the authorization is terminated or revoked sooner. Performed at [facility], [place] [address], [place], [place] [address]   Culture, blood (routine x 2)     Status: None   Collection Time: 05/19/19 11:38 AM  Result Value Ref Range Status   Specimen Description BLOOD LEFT ANTECUBITAL  Final   Special Requests   Final    BOTTLES DRAWN AEROBIC AND ANAEROBIC Blood Culture adequate volume   Culture   Final    NO GROWTH 5 DAYS  Performed at [facility], [place] [address] [place], [place] [address]    Report Status 05/24/2019 FINAL  Final  Culture, blood (routine x 2)     Status: None   Collection Time: 05/19/19 11:40 AM  Result Value Ref Range Status   Specimen Description BLOOD RIGHT ANTECUBITAL  Final   Special Requests   Final    BOTTLES DRAWN AEROBIC AND ANAEROBIC Blood Culture results may not be optimal due to an inadequate volume of blood received in culture bottles   Culture   Final    NO GROWTH 5 DAYS Performed at [facility], [place] [address] [place], [place] [address]    Report Status 05/24/2019 FINAL  Final  MRSA PCR Screening     Status: None   Collection Time: 05/19/19  4:20 PM  Result Value Ref Range Status   MRSA by PCR NEGATIVE NEGATIVE Final    Comment:        The GeneXpert MRSA Assay (FDA approved for NASAL specimens only), is one component of a comprehensive MRSA colonization surveillance program. It is not intended to diagnose MRSA infection nor to guide or monitor treatment for MRSA infections. Performed at [facility], [place] [address], [place], [place] [address]   Acid Fast Smear (AFB)     Status: None   Collection Time: 05/21/19  2:08 PM  Result Value Ref Range Status   AFB Specimen Processing Concentration  Final   Acid Fast Smear Negative  Final    Comment: (NOTE) Performed At: [facility] [place], [place] [id number] Rush Farmer MD GT:[id number]    Source (AFB) FLUID  Final    Comment: RIGHT PLEURAL Performed at [facility], [place] [address] [place], [place] [address]   Culture, body fluid-bottle     Status: None   Collection Time: 05/21/19  2:08 PM  Result Value Ref Range Status   Specimen Description FLUID  Final   Special Requests RIGHT PLEURAL  Final   Culture   Final    NO GROWTH 5 DAYS Performed at [facility], [address], [place],  [address]    Report Status 05/26/2019 FINAL  Final   Gram stain     Status: None  Collection Time: 05/21/19  2:08 PM  Result Value Ref Range Status   Specimen Description FLUID  Final   Special Requests RIGHT PLEURAL  Final   Gram Stain   Final    ABUNDANT WBC PRESENT, PREDOMINANTLY PMN NO ORGANISMS SEEN Performed at [facility], [address], [place], [place] [address]    Report Status 05/21/2019 FINAL  Final         Radiology Studies: No results found.      Scheduled Meds: . allopurinol  100 mg Oral Daily  . amiodarone  200 mg Oral Daily  . amoxicillin-clavulanate  1 tablet Oral Q12H  . apixaban  2.5 mg Oral BID  . bisacodyl  10 mg Oral Daily  . bisoprolol  5 mg Oral Daily  . budesonide (PULMICORT) nebulizer solution  0.25 mg Nebulization BID  . Chlorhexidine Gluconate Cloth  6 each Topical Daily  . ferrous sulfate  325 mg Oral Daily  . furosemide  40 mg Oral BID  . sodium chloride flush  3 mL Intravenous Q12H   Continuous Infusions: . sodium chloride       LOS: 7 days     Vernell Leep, MD, FACP, [facility]. [facility]  To contact the attending provider between 7A-7P or the covering provider during after hours 7P-7A, please log into the web site [email or web address] and access using universal [place] password for that web site. If you do not have the password, please call the hospital operator.  05/26/2019, 2:14 PM

## 2019-05-27 DIAGNOSIS — I5021 Acute systolic (congestive) heart failure: Secondary | ICD-10-CM

## 2019-05-27 DIAGNOSIS — I4892 Unspecified atrial flutter: Secondary | ICD-10-CM

## 2019-05-27 LAB — BASIC METABOLIC PANEL WITH GFR
Anion gap: 10 (ref 5–15)
BUN: 61 mg/dL — ABNORMAL HIGH (ref 8–23)
CO2: 31 mmol/L (ref 22–32)
Calcium: 9.5 mg/dL (ref 8.9–10.3)
Chloride: 103 mmol/L (ref 98–111)
Creatinine, Ser: 3.12 mg/dL — ABNORMAL HIGH (ref 0.61–1.24)
GFR calc Af Amer: 20 mL/min — ABNORMAL LOW (ref >60)
GFR calc non Af Amer: 18 mL/min — ABNORMAL LOW (ref >60)
Glucose, Bld: 96 mg/dL (ref 70–99)
Potassium: 4.3 mmol/L (ref 3.5–5.1)
Sodium: 144 mmol/L (ref 135–145)

## 2019-05-27 LAB — ACETYLCHOLINE RECEPTOR AB, ALL
Acety choline binding ab: <0.03 nmol/L (ref 0.00–0.24)
Acetylchol Block Ab: 23 % (ref 0–25)
Acetylcholine Modulat Ab: <12 % (ref 0–20)

## 2019-05-27 NOTE — Progress Notes (Signed)
PROGRESS NOTE   LIBERATO STANSBERY  OZD:[id number]    DOB: 1933/05/17    DOA: 05/19/2019  PCP: Maury Dus, MD   I have briefly reviewed patients previous medical records in [facility].  Brief Narrative:  85 year old male with PMH of COPD, chronic hypoxic respiratory failure on home oxygen at 4 L/min, chronic systolic CHF with EF 25% in 2017 which subsequently normalized, A. fib/PSVT with history of TEE cardioversion, HTN, stage IV CKD, alcohol abuse, colon cancer who initially presented with chest pain and dyspnea.  Recent prolonged hospitalization in March/April for pneumonia, septic shock complicated by A. fib with RVR and worsening renal failure.  In ED developed progressive hypoxia, placed on BiPAP which patient not fully compliant with, DNR, CT chest 5/31 showed new right-sided consolidation with right moderate pleural effusion, high PCT, suspecting H CAP, status post thoracentesis 05/21/2019.  Cardiology consulted 6/3.  Medically stable for discharge to CIR pending insurance approval, hopefully 6/8.   Assessment & Plan:   Principal Problem:   Acute on chronic respiratory failure ([name]) Active Problems:   AKI (acute kidney injury) ([place])   Atrial fibrillation with RVR ([name])   HCAP (healthcare-associated pneumonia)   Chronic respiratory failure with hypoxia ([name])   Acute on chronic hypoxic respiratory failure - Suspected due to HCAP and right-sided pleural effusion.  He may have underlying undiagnosed sleep apnea. -IV cefepime switched to Augmentin 6/4 to complete total 7 days treatment.  MRSA PCR negative and vancomycin discontinued. - Blood cultures x2: Negative to date. - SARS coronavirus 2: Negative. - CT chest showed worsening right sided consolidation and effusion and hence underwent thoracentesis, please see discussion below. - After MBS and as per speech therapy recommendation, remains on regular diet and thin liquids with low index of suspicion for aspiration. -  Acetylcholine receptor antibody results pending. - On home oxygen 3-4 L/min at baseline. -Target for oxygen saturation between 88-92%. -Patient declining or unable to tolerate BiPAP more than a short time.  Discussed with Dr. Nelda Marseille, thereby no BiPAP at discharge. -Stable.  Down to oxygen 3 L/min via nasal cannula.  Suspected right lower lobe HCAP versus likely aspiration pneumonia -Was on IV cefepime.  Cultures thus far negative. -As discussed with Dr. Nelda Marseille, PCCM, despite negative swallow evaluation, patient has had persistent RLL pneumonia despite multiple antibiotic courses. -Changed cefepime to Augmentin on 6/4 and complete total 7 days course. -Recommend repeating CT chest in 4 to 6 weeks and outpatient follow-up arranged arranged by pulmonology.  Right-sided parapneumonic pleural effusion -Status post ultrasound-guided thoracentesis by IR on 6/1: Total of 1.85 L of dark red fluid was removed. - Pleural fluid: WBCs 1463, 82% neutrophils, protein 3.2, LDH 140, culture negative to date.  Reactive mesothelial cells and macrophages present, no atypia seen. -Suspected exudative?  Parapneumonic. -IV cefepime changed to Augmentin. -Chest x-ray 6/3 without sizable effusion.  Improved. -Pulmonology follow-up appreciated, discussed in detail with Dr. Nelda Marseille.  Management as outlined above.  Acute on stage IV chronic kidney disease -Baseline creatinine reportedly approximately 2.5. -On 6/1 creatinine up to 3.7.  Lasix discontinued.  Creatinine continues to gradually improve, down to 3.12 today. -Patient absolutely does not want dialysis.  Acute systolic CHF/LVEF 95-63%/[id number]. -Last EF was 60-65%. -Clinically appears euvolemic.  Diuretics were temporarily held.  Lasix 40 mg daily restarted. -Not a candidate for ACEI/ARB/Aldactone due to CKD.  CHF not felt to be primary etiology for patient's pleural effusion. -Improved and stable.  Continue current dose of Lasix and Zebeta.  A.  fib/flutter with RVR -Failed TEE cardioversion on 03/16/2019. -Remains on amiodarone and Zebeta. -Cardizem discontinued -As per discussion with cardiology, cardioversion may be less likely to be successful until primary pulmonary processes addressed.   -IV heparin discontinued and started Eliquis due to chronic kidney disease. - Op f/u with Dr. Quay Burow, Cardiology 6/17  Elevated TSH -Possibly sick euthyroid.  Follow TSH in 4 weeks.  Gout -No acute flare presently.  Continue allopurinol prophylaxis.  Morbid obesity/Body mass index is 32.11 kg/m.  Suspected OSA Outpatient evaluation.  As discussed with pulmonology, patient unable to tolerate BiPAP and hence can continue just oxygen via nasal cannula.  Ongoing issues with desaturations while asleep.  Hopefully can be evaluated as outpatient and be able to use a CPAP device that he can tolerate.  Macrocytic anemia Stable.  Received IV iron.  Continue oral iron. Patient reports that he he has an outpatient appointment to come to [facility] on 6/11 for IV iron by his Nephrologist.  Advised him that this can be given at inpatient rehab on the same date.  Physical deconditioning -DC to CIR pending insurance approval and acceptance by CIR. as per CIR coordinator update, insurance approval not back to date and hence they will not be able to admit him to inpatient rehab at least until 6/8.  DVT prophylaxis: IV heparin changed to Eliquis. Code Status: DNR Family Communication: I discussed in detail with patient's spouse 6/4, updated care and answered questions. Disposition: DC to CIR pending acceptance and insurance approval, possibly 6/8.   Consultants:  PCCM Cardiology IR  Procedures:  Ultrasound guided thoracentesis 6/1  Antimicrobials:  IV cefepime-discontinued 6/4 Oral Augmentin 6/4 >   Subjective: "I am fine".  Denies complaints.  No dyspnea or chest pain.  ROS: As above  Objective:  Vitals:   05/26/19 1937 05/26/19  2314 05/27/19 0731 05/27/19 1100  BP:  113/83 134/78   Pulse:  99 97   Resp:  16 17 (!) 21  Temp:  97.8 F (36.6 C) 98 F (36.7 C)   TempSrc:  Oral Oral   SpO2: 93% 92% 92%   Weight:      Height:        Examination:  General exam: Pleasant elderly male, moderately built and obese sitting up comfortably in chair. Respiratory system: Distant breath sounds but clear to auscultation without wheezing, rhonchi or crackles.  Stable. Cardiovascular system: S1 & S2 heard, RRR. No JVD, murmurs, rubs, gallops or clicks.  Telemetry personally reviewed: A. fib with controlled ventricular rate/SR.  BBB morphology. Gastrointestinal system: Abdomen is nondistended, soft and nontender. No organomegaly or masses felt. Normal bowel sounds heard.  Stable. Central nervous system: Alert and oriented. No focal neurological deficits.  Stable. Extremities: Symmetric 5 x 5 power. Skin: No rashes, lesions or ulcers Psychiatry: Judgement and insight appear normal. Mood & affect appropriate.     Data Reviewed: I have personally reviewed following labs and imaging studies  CBC: Recent Labs  Lab 05/21/19 0524 05/22/19 0307 05/23/19 0330 05/23/19 0341 05/24/19 0454  WBC 11.9* 9.7 7.4  --  6.8  HGB 8.3* 8.1* 7.9* 7.8* 8.5*  HCT 27.6* 27.6* 26.4* 23.0* 28.7*  MCV 107.4* 107.4* 106.5*  --  107.5*  PLT 190 198 197  --  829   Basic Metabolic Panel: Recent Labs  Lab 05/21/19 0524 05/22/19 0307 05/23/19 0330 05/23/19 0341 05/24/19 0454 05/25/19 0533 05/26/19 0546 05/27/19 0645  NA 137 140 140 139 144 145 145 144  K  4.5 4.5 4.3 4.3 4.9 4.9 4.8 4.3  CL 96* 98 99  --  103 105 104 103  CO2 30 30 32  --  32 32 32 31  GLUCOSE 122* 111* 98  --  95 100* 100* 96  BUN 75* 78* 75*  --  69* 67* 65* 61*  CREATININE 3.70* 3.71* 3.41*  --  3.40* 3.25* 3.14* 3.12*  CALCIUM 8.6* 8.7* 8.9  --  9.2 9.4 9.5 9.5  MG 2.3 2.3 2.3  --  2.2  --   --   --   PHOS 4.7* 4.4 3.1  --  2.6  --   --   --    Liver Function  Tests: Recent Labs  Lab 05/21/19 0524  AST 14*  ALT 10  ALKPHOS 50  BILITOT 0.5  PROT 6.0*  ALBUMIN 2.3*   Cardiac Enzymes: Recent Labs  Lab 05/21/19 0524  TROPONINI <0.03   CBG: No results for input(s): GLUCAP in the last 168 hours.  Recent Results (from the past 240 hour(s))  SARS Coronavirus 2 (CEPHEID- Performed in [place] hospital lab), Hosp Order     Status: None   Collection Time: 05/19/19  9:35 AM  Result Value Ref Range Status   SARS Coronavirus 2 NEGATIVE NEGATIVE Final    Comment: (NOTE) If result is NEGATIVE SARS-CoV-2 target nucleic acids are NOT DETECTED. The SARS-CoV-2 RNA is generally detectable in upper and lower  respiratory specimens during the acute phase of infection. The lowest  concentration of SARS-CoV-2 viral copies this assay can detect is 250  copies / mL. A negative result does not preclude SARS-CoV-2 infection  and should not be used as the sole basis for treatment or other  patient management decisions.  A negative result may occur with  improper specimen collection / handling, submission of specimen other  than nasopharyngeal swab, presence of viral mutation(s) within the  areas targeted by this assay, and inadequate number of viral copies  (<250 copies / mL). A negative result must be combined with clinical  observations, patient history, and epidemiological information. If result is POSITIVE SARS-CoV-2 target nucleic acids are DETECTED. The SARS-CoV-2 RNA is generally detectable in upper and lower  respiratory specimens dur ing the acute phase of infection.  Positive  results are indicative of active infection with SARS-CoV-2.  Clinical  correlation with patient history and other diagnostic information is  necessary to determine patient infection status.  Positive results do  not rule out bacterial infection or co-infection with other viruses. If result is PRESUMPTIVE POSTIVE SARS-CoV-2 nucleic acids MAY BE PRESENT.   A  presumptive positive result was obtained on the submitted specimen  and confirmed on repeat testing.  While 2019 novel coronavirus  (SARS-CoV-2) nucleic acids may be present in the submitted sample  additional confirmatory testing may be necessary for epidemiological  and / or clinical management purposes  to differentiate between  SARS-CoV-2 and other Sarbecovirus currently known to infect humans.  If clinically indicated additional testing with an alternate test  methodology [phone]) is advised. The SARS-CoV-2 RNA is generally  detectable in upper and lower respiratory sp ecimens during the acute  phase of infection. The expected result is Negative. Fact Sheet for Patients:  StrictlyIdeas.no Fact Sheet for Healthcare Providers: BankingDealers.co.za This test is not yet approved or cleared by the [place] FDA and has been authorized for detection and/or diagnosis of SARS-CoV-2 by FDA under an Emergency Use Authorization (EUA).  This EUA will remain in  effect (meaning this test can be used) for the duration of the COVID-19 declaration under Section 564(b)(1) of the Act, 21 U.S.C. section 360bbb-3(b)(1), unless the authorization is terminated or revoked sooner. Performed at [facility], [place] [address], [place], [place] [address]   Culture, blood (routine x 2)     Status: None   Collection Time: 05/19/19 11:38 AM  Result Value Ref Range Status   Specimen Description BLOOD LEFT ANTECUBITAL  Final   Special Requests   Final    BOTTLES DRAWN AEROBIC AND ANAEROBIC Blood Culture adequate volume   Culture   Final    NO GROWTH 5 DAYS Performed at [facility], [place] [address] [place], [place] [address]    Report Status 05/24/2019 FINAL  Final  Culture, blood (routine x 2)     Status: None   Collection Time: 05/19/19 11:40 AM  Result Value Ref Range Status   Specimen Description BLOOD RIGHT ANTECUBITAL  Final   Special  Requests   Final    BOTTLES DRAWN AEROBIC AND ANAEROBIC Blood Culture results may not be optimal due to an inadequate volume of blood received in culture bottles   Culture   Final    NO GROWTH 5 DAYS Performed at [facility], [place] [address], [place], [place] [address]    Report Status 05/24/2019 FINAL  Final  MRSA PCR Screening     Status: None   Collection Time: 05/19/19  4:20 PM  Result Value Ref Range Status   MRSA by PCR NEGATIVE NEGATIVE Final    Comment:        The GeneXpert MRSA Assay (FDA approved for NASAL specimens only), is one component of a comprehensive MRSA colonization surveillance program. It is not intended to diagnose MRSA infection nor to guide or monitor treatment for MRSA infections. Performed at [facility], [place] [address], [place], [place] [address]   Acid Fast Smear (AFB)     Status: None   Collection Time: 05/21/19  2:08 PM  Result Value Ref Range Status   AFB Specimen Processing Concentration  Final   Acid Fast Smear Negative  Final    Comment: (NOTE) Performed At: [facility] [place], [place] [id number] Rush Farmer MD MC:[id number]    Source (AFB) FLUID  Final    Comment: RIGHT PLEURAL Performed at [facility], [place] [address] [place], [place] [address]   Culture, body fluid-bottle     Status: None   Collection Time: 05/21/19  2:08 PM  Result Value Ref Range Status   Specimen Description FLUID  Final   Special Requests RIGHT PLEURAL  Final   Culture   Final    NO GROWTH 5 DAYS Performed at [facility], [address], [place], [place] [address]    Report Status 05/26/2019 FINAL  Final  Gram stain     Status: None   Collection Time: 05/21/19  2:08 PM  Result Value Ref Range Status   Specimen Description FLUID  Final   Special Requests RIGHT PLEURAL  Final   Gram Stain   Final    ABUNDANT WBC PRESENT, PREDOMINANTLY PMN NO ORGANISMS SEEN Performed at [facility], [place]  [address], [place], [place] [address]    Report Status 05/21/2019 FINAL  Final         Radiology Studies: No results found.      Scheduled Meds: . allopurinol  100 mg Oral Daily  .  amiodarone  200 mg Oral Daily  . amoxicillin-clavulanate  1 tablet Oral Q12H  . apixaban  2.5 mg Oral BID  . bisacodyl  10 mg Oral Daily  . bisoprolol  5 mg Oral Daily  . budesonide (PULMICORT) nebulizer solution  0.25 mg Nebulization BID  . Chlorhexidine Gluconate Cloth  6 each Topical Daily  . ferrous sulfate  325 mg Oral Daily  . furosemide  40 mg Oral BID  . sodium chloride flush  3 mL Intravenous Q12H   Continuous Infusions: . sodium chloride       LOS: 8 days     Vernell Leep, MD, FACP, [facility]. [facility]  To contact the attending provider between 7A-7P or the covering provider during after hours 7P-7A, please log into the web site [email or web address] and access using universal [employer] password for that web site. If you do not have the password, please call the hospital operator.  05/27/2019, 4:02 PM

## 2019-05-27 NOTE — Plan of Care (Signed)

## 2019-05-27 NOTE — Progress Notes (Signed)
Progress Note  Patient Name: David Barton Date of Encounter: 05/27/2019  Primary Cardiologist: Quay Burow  Subjective   No reported chest pain or palpitations.  Still having issues with intermittent oxygen desaturation, reports that he tolerated ambulation somewhat yesterday.  Has wheezing but no cough.  Inpatient Medications    Scheduled Meds: . allopurinol  100 mg Oral Daily  . amiodarone  200 mg Oral Daily  . amoxicillin-clavulanate  1 tablet Oral Q12H  . apixaban  2.5 mg Oral BID  . bisacodyl  10 mg Oral Daily  . bisoprolol  5 mg Oral Daily  . budesonide (PULMICORT) nebulizer solution  0.25 mg Nebulization BID  . Chlorhexidine Gluconate Cloth  6 each Topical Daily  . ferrous sulfate  325 mg Oral Daily  . furosemide  40 mg Oral BID  . sodium chloride flush  3 mL Intravenous Q12H   Continuous Infusions: . sodium chloride     PRN Meds: sodium chloride, ipratropium-albuterol, nitroGLYCERIN, ondansetron (ZOFRAN) IV, sodium chloride flush   Vital Signs    Vitals:   05/26/19 1639 05/26/19 1937 05/26/19 2314 05/27/19 0731  BP: 120/75  113/83 134/78  Pulse: 83  99 97  Resp: 19  16 17   Temp: 98.4 F (36.9 C)  97.8 F (36.6 C) 98 F (36.7 C)  TempSrc: Oral  Oral Oral  SpO2: 92% 93% 92% 92%  Weight:      Height:       No intake or output data in the 24 hours ending 05/27/19 0800 Filed Weights   05/24/19 0455 05/25/19 0500 05/26/19 0332  Weight: 103 kg 101.5 kg 101.5 kg    Telemetry    Atrial flutter with variable conduction.  Personally reviewed.  Physical Exam   GEN:  Elderly male.  No acute distress.   Neck: No JVD. Cardiac:  Irregular,, no gallop.  Respiratory: Nonlabored at rest.  Prolonged expiratory phase with mild wheeze. GI: Soft, nontender, bowel sounds present. MS:  Mild ankle edema; No deformity. Neuro:  Nonfocal. Psych: Alert and oriented x 3. Normal affect.  Labs    Chemistry Recent Labs  Lab 05/21/19 0524  05/25/19 0533  05/26/19 0546 05/27/19 0645  NA 137   < > 145 145 144  K 4.5   < > 4.9 4.8 4.3  CL 96*   < > 105 104 103  CO2 30   < > 32 32 31  GLUCOSE 122*   < > 100* 100* 96  BUN 75*   < > 67* 65* 61*  CREATININE 3.70*   < > 3.25* 3.14* 3.12*  CALCIUM 8.6*   < > 9.4 9.5 9.5  PROT 6.0*  --   --   --   --   ALBUMIN 2.3*  --   --   --   --   AST 14*  --   --   --   --   ALT 10  --   --   --   --   ALKPHOS 50  --   --   --   --   BILITOT 0.5  --   --   --   --   GFRNONAA 14*   < > 17* 17* 18*  GFRAA 17*   < > 19* 20* 20*  ANIONGAP 11   < > 8 9 10    < > = values in this interval not displayed.     Hematology Recent Labs  Lab 05/22/19 [phone] 05/23/19 0330  05/23/19 0341 05/24/19 0454  WBC 9.7 7.4  --  6.8  RBC 2.57* 2.48*  --  2.67*  HGB 8.1* 7.9* 7.8* 8.5*  HCT 27.6* 26.4* 23.0* 28.7*  MCV 107.4* 106.5*  --  107.5*  MCH 31.5 31.9  --  31.8  MCHC 29.3* 29.9*  --  29.6*  RDW 16.4* 16.3*  --  16.6*  PLT 198 197  --  221    Cardiac Enzymes Recent Labs  Lab 05/21/19 0524  TROPONINI <0.03   No results for input(s): TROPIPOC in the last 168 hours.   Radiology    No results found.  Cardiac Studies   Echocardiogram 05/21/2019: 1. The left ventricle has mildly reduced systolic function, with an ejection fraction of 45-50%. The cavity size was normal. There is mildly increased left ventricular wall thickness. Left ventricular diastolic Doppler parameters are indeterminate. 2. LV endocardium difficult to see (No Definity used). EF appears to be ~45-50% with hypokinesis of the inferolateral, inferoseptal and infero-apical myocardium. Consider repeat imaging with Definity to better assess. 3. Left atrial size was moderately dilated. 4. Right atrial size was moderately dilated. 5. Small pericardial effusion. 6. The pericardial effusion is posterior to the left ventricle and circumferential. 7. Mild calcification of the mitral valve leaflet. 8. The aortic valve is tricuspid. Moderate  calcification of the aortic valve. No stenosis of the aortic valve. 9. The aortic root and ascending aorta are normal in size and structure. 10. The inferior vena cava was dilated in size with <50% respiratory variability. 11. The interatrial septum was not well visualized.  Patient Profile     85 y.o. male with COPD and chronic hypoxic respiratory failure, CKD, nonischemic cardiomyopathy with prior normalization of LVEF, persistent atrial fibrillation/flutter status post failed cardioversion and community-acquired pneumonia.  Assessment & Plan    1.  Acute systolic heart failure with mildly reduced LVEF at 45 to 50%.  He has a prior history of nonischemic cardiomyopathy with normalization of LVEF.  Present troponin I levels are not consistent with ACS.  He is being managed medically.  2.  Persistent atrial flutter with adequate heart rate control.  He is on Eliquis and amiodarone having failed a prior cardioversion attempt in March.  3.  CKD stage IV, creatinine 3.1.  4.  HCAP versus aspiration pneumonia with right-sided parapneumonic pleural effusion and acute on chronic hypoxic respiratory failure.  Management ongoing per primary team.  He has not tolerated BiPAP.  Continue Eliquis, amiodarone, Lasix and Zebeta.  He is not a candidate for ARB/ANRI/Aldactone due to present degree of renal insufficiency.  Would defer another attempt at cardioversion until he is more stable as an outpatient.  Signed, Rozann Lesches, MD  05/27/2019, 8:00 AM

## 2019-05-28 ENCOUNTER — Encounter (HOSPITAL_COMMUNITY): Payer: Self-pay

## 2019-05-28 DIAGNOSIS — I4891 Unspecified atrial fibrillation: Secondary | ICD-10-CM

## 2019-05-28 NOTE — Progress Notes (Signed)
Inpatient Rehab Admissions Coordinator:   Received denial from [facility] Medicare for inpatient rehab. Dr. Algis Liming to perform peer to peer review today.  I let patient know.  Will continue to follow.    Shann Medal, PT, DPT Admissions Coordinator [phone] 05/28/19  1:29 PM

## 2019-05-28 NOTE — Progress Notes (Signed)
Occupational Therapy Treatment Patient Details Name: David Barton MRN: [id number] DOB: 1933/06/06 Today's Date: 05/28/2019    History of present illness Pt adm with acute on chronic hypoxic respiratory failure and found to have rt sided effusion. PMH - copd on home O2, afib, htn, ckd, colon CA, etoh abuse   OT comments  Pt continues to make progress towards occupational therapy goals. Pt is very motivated for OT intervention. Pt standing and ambulating with min guard for balance to bathroom with use of RW. Pt demonstrated transfer onto standard commode with steady assistance for balance. Pt ambulating 150' with RW and min guard while on 3 L of O2 via [place]. Pt returning to room for seated rest break and O2 desat to 85% but able to increase to 92% with focus on pursed lip breathing and less than 1 minute to recover.  OT provided pt with level 1 resistive theraband for B UE strengthening exercises and pt performing 2 sets of 10 chest pulls and bicep curls with min cuing for proper technique. Pt remained seated in wheelchair with call bell and all needed items within reach upon exiting the room. Pt continues to benefit from OT intervention to address functional deficits.   Follow Up Recommendations  CIR    Equipment Recommendations  Other (comment)(defer to next venue of care)       Precautions / Restrictions Precautions Precautions: Fall;Other (comment) Precaution Comments: watch O2 Restrictions Weight Bearing Restrictions: No       Mobility Bed Mobility     General bed mobility comments: sitting in recliner   Transfers Overall transfer level: Needs assistance Equipment used: Rolling walker (2 wheeled) Transfers: Sit to/from Stand Sit to Stand: Min guard;Supervision  General transfer comment: sit to stand from recliner    Balance Overall balance assessment: Needs assistance Sitting-balance support: No upper extremity supported;Feet supported Sitting balance-Leahy Scale: Fair      Standing balance support: Bilateral upper extremity supported Standing balance-Leahy Scale: Poor Standing balance comment: walker and min guard for static standing         ADL either performed or assessed with clinical judgement   ADL     Toilet Transfer: Min guard;Grab bars;Ambulation;Regular Toilet   Toileting- Clothing Manipulation and Hygiene: Minimal assistance;Sit to/from stand                         Cognition Arousal/Alertness: Awake/alert Behavior During Therapy: WFL for tasks assessed/performed Overall Cognitive Status: Within Functional Limits for tasks assessed            Exercises Exercises: General Lower Extremity;Other exercises General Exercises - Lower Extremity Hip ABduction/ADduction: AROM;Both;10 reps;Standing Hip Flexion/Marching: AROM;Both;10 reps;Standing Toe Raises: AROM;Both;10 reps;Standing Mini-Sqauts: AROM;Both;10 reps;Standing Other Exercises Other Exercises: knee flexion x 10 in standing.            Pertinent Vitals/ Pain       Pain Assessment: No/denies pain      Frequency  Min 2X/week        Progress Toward Goals  OT Goals(current goals can now be found in the care plan section)  Progress towards OT goals: Progressing toward goals  Acute Rehab OT Goals Patient Stated Goal: to go to rehab to get stronger OT Goal Formulation: With patient Time For Goal Achievement: 06/11/19 Potential to Achieve Goals: Good  Plan Discharge plan remains appropriate       AM-PAC OT "6 Clicks" Daily Activity     Outcome Measure   Help from  another person eating meals?: None Help from another person taking care of personal grooming?: A Little Help from another person toileting, which includes using toliet, bedpan, or urinal?: A Little Help from another person bathing (including washing, rinsing, drying)?: A Little Help from another person to put on and taking off regular upper body clothing?: A Little Help from another person  to put on and taking off regular lower body clothing?: A Lot 6 Click Score: 18    End of Session Equipment Utilized During Treatment: Rolling walker;Oxygen  OT Visit Diagnosis: Muscle weakness (generalized) (M62.81)   Activity Tolerance Treatment limited secondary to medical complications (Comment)   Patient Left in chair;with call bell/phone within reach   Nurse Communication Mobility status        Time: 7289-7915 OT Time Calculation (min): 21 min  Charges: OT General Charges $OT Visit: 1 Visit OT Treatments $Therapeutic Activity: 8-22 mins   Verdine Grenfell P, MS, OTR/L 05/28/2019, 2:32 PM

## 2019-05-28 NOTE — Progress Notes (Signed)
PROGRESS NOTE   David Barton  BTD:[id number]    DOB: Apr 20, 1933    DOA: 05/19/2019  PCP: Maury Dus, MD   I have briefly reviewed patients previous medical records in [facility].  Brief Narrative:  85 year old male with PMH of COPD, chronic hypoxic respiratory failure on home oxygen at 4 L/min, chronic systolic CHF with EF 36% in 2017 which subsequently normalized, A. fib/PSVT with history of TEE cardioversion, HTN, stage IV CKD, alcohol abuse, colon cancer who initially presented with chest pain and dyspnea.  Recent prolonged hospitalization in March/April for pneumonia, septic shock complicated by A. fib with RVR and worsening renal failure.  In ED developed progressive hypoxia, placed on BiPAP which patient not fully compliant with, DNR, CT chest 5/31 showed new right-sided consolidation with right moderate pleural effusion, high PCT, suspecting H CAP, status post thoracentesis 05/21/2019.  Cardiology consulted 6/3.  Patient has been medically stable for discharge since 6/5 at which point he was waiting for insurance approval for CIR.  As per discussion with rehab team, insurance papers were filed on 6/4 but she only heard back from them today and he was declined for CIR.  I requested a peer to peer phone call with the insurance company this morning but until late this evening have not heard back.  I am also informed that if inpatient rehab stay is denied by insurance after peer to peer then patient does not wish to pursue SNF and would prefer to go home.   Assessment & Plan:   Principal Problem:   Acute on chronic respiratory failure ([name]) Active Problems:   AKI (acute kidney injury) ([place])   Atrial fibrillation with RVR ([name])   HCAP (healthcare-associated pneumonia)   Chronic respiratory failure with hypoxia ([name])   Acute on chronic hypoxic respiratory failure - Suspected due to HCAP and right-sided pleural effusion.  He may have underlying undiagnosed sleep apnea. -IV cefepime  switched to Augmentin 6/4 to complete total 7 days treatment.  MRSA PCR negative and vancomycin discontinued. - Blood cultures x2: Negative to date. - SARS coronavirus 2: Negative. - CT chest showed worsening right sided consolidation and effusion and hence underwent thoracentesis, please see discussion below. - After MBS and as per speech therapy recommendation, remains on regular diet and thin liquids with low index of suspicion for aspiration. - Acetylcholine receptor antibody results pending. -As per update from rehab team who evaluated him during prior hospitalization, prior to recent hospitalization, he was independent and used oxygen 2 L/min.  After recent hospitalization, CIR was denied, returned home, ambulating with the help of a walker and was walking up to 50 yards and on oxygen 4 L/min. -Target for oxygen saturation between 88-92%. -Patient declining or unable to tolerate BiPAP more than a short time.  Discussed with Dr. Nelda Marseille, thereby no BiPAP at discharge. -Stable.  Down to oxygen 3 L/min via nasal cannula.  Ongoing episodes of intermittent hypoxemia while sleeping.  Suspected right lower lobe HCAP versus likely aspiration pneumonia -Was on IV cefepime.  Cultures thus far negative. -As discussed with Dr. Nelda Marseille, PCCM, despite negative swallow evaluation, patient has had persistent RLL pneumonia despite multiple antibiotic courses. -Changed cefepime to Augmentin on 6/4 and complete total 7 days course. -Recommend repeating CT chest in 4 to 6 weeks and outpatient follow-up arranged arranged by pulmonology.  Right-sided parapneumonic pleural effusion -Status post ultrasound-guided thoracentesis by IR on 6/1: Total of 1.85 L of dark red fluid was removed. - Pleural fluid: WBCs 1463,  82% neutrophils, protein 3.2, LDH 140, culture negative to date.  Reactive mesothelial cells and macrophages present, no atypia seen. -Suspected exudative?  Parapneumonic. -IV cefepime changed to  Augmentin. -Chest x-ray 6/3 without sizable effusion.  Improved. -Pulmonology follow-up appreciated, discussed in detail with Dr. Nelda Marseille.  Management as outlined above.  Acute on stage IV chronic kidney disease -Baseline creatinine reportedly approximately 2.5. -On 6/1 creatinine up to 3.7.  Lasix discontinued.  Creatinine continues to gradually improve, down to 3.12 today. -Patient absolutely does not want dialysis.  Acute systolic CHF/LVEF 93-23%/[id number]. -Last EF was 60-65%. -Clinically appears euvolemic.  Diuretics were temporarily held.  Lasix 40 mg daily restarted. -Not a candidate for ACEI/ARB/Aldactone due to CKD.  CHF not felt to be primary etiology for patient's pleural effusion. -Improved and stable.  Continue current dose of Lasix and Zebeta.  A. fib/flutter with RVR -Failed TEE cardioversion on 03/16/2019. -Remains on amiodarone and Zebeta. -Cardizem discontinued -As per discussion with cardiology, cardioversion may be less likely to be successful until primary pulmonary processes addressed.   -IV heparin discontinued and started Eliquis due to chronic kidney disease. - Op f/u with Dr. Quay Burow, Cardiology 6/17  Elevated TSH -Possibly sick euthyroid.  Follow TSH in 4 weeks.  Gout -No acute flare presently.  Continue allopurinol prophylaxis.  Morbid obesity/Body mass index is 32.11 kg/m.  Suspected OSA Outpatient evaluation.  As discussed with pulmonology, patient unable to tolerate BiPAP and hence can continue just oxygen via nasal cannula.  Ongoing issues with desaturations while asleep.  Hopefully can be evaluated as outpatient and be able to use a CPAP device that he can tolerate.  Macrocytic anemia Stable.  Received IV iron.  Continue oral iron. Patient reports that he he has an outpatient appointment to come to [facility] on 6/11 for IV iron by his Nephrologist.  This can be pursued as outpatient.  Physical deconditioning -As per discussion with rehab  team, insurance declined admission to CIR.  Requested peer to peer phone call and waiting.  DVT prophylaxis: IV heparin changed to Eliquis. Code Status: DNR Family Communication: I discussed in detail with patient's spouse 6/4, updated care and answered questions. Disposition: To be determined pending peer-to-peer phone call with patient's insurance company.  Hopefully can be sorted out 6/9.  If approved then admission to CIR.  If declined then home with home health services.   Consultants:  PCCM Cardiology IR  Procedures:  Ultrasound guided thoracentesis 6/1  Antimicrobials:  IV cefepime-discontinued 6/4 Oral Augmentin 6/4 >   Subjective: Patient denies complaints.  ROS: As above  Objective:  Vitals:   05/28/19 0724 05/28/19 0900 05/28/19 0928 05/28/19 1714  BP: 110/69   103/70  Pulse: 99  100 94  Resp:   20   Temp: 97.8 F (36.6 C)   98.2 F (36.8 C)  TempSrc: Oral   Oral  SpO2: 95% 92% 91% 93%  Weight:      Height:        Examination:  General exam: Pleasant elderly male, moderately built and obese sitting up comfortably in chair. Respiratory system: Clear to auscultation.  No increased work of breathing. Cardiovascular system: S1 & S2 heard, RRR. No JVD, murmurs, rubs, gallops or clicks.  Trace bilateral ankle edema.  Telemetry personally reviewed: A. fib with controlled ventricular rate/BBB morphology. Gastrointestinal system: Abdomen is nondistended, soft and nontender. No organomegaly or masses felt. Normal bowel sounds heard.  Stable. Central nervous system: Alert and oriented. No focal neurological deficits.  Stable. Extremities:  Symmetric 5 x 5 power. Skin: No rashes, lesions or ulcers Psychiatry: Judgement and insight appear normal. Mood & affect appropriate.     Data Reviewed: I have personally reviewed following labs and imaging studies  CBC: Recent Labs  Lab 05/22/19 0307 05/23/19 0330 05/23/19 0341 05/24/19 0454  WBC 9.7 7.4  --  6.8   HGB 8.1* 7.9* 7.8* 8.5*  HCT 27.6* 26.4* 23.0* 28.7*  MCV 107.4* 106.5*  --  107.5*  PLT 198 197  --  528   Basic Metabolic Panel: Recent Labs  Lab 05/22/19 0307 05/23/19 0330 05/23/19 0341 05/24/19 0454 05/25/19 0533 05/26/19 0546 05/27/19 0645  NA 140 140 139 144 145 145 144  K 4.5 4.3 4.3 4.9 4.9 4.8 4.3  CL 98 99  --  103 105 104 103  CO2 30 32  --  32 32 32 31  GLUCOSE 111* 98  --  95 100* 100* 96  BUN 78* 75*  --  69* 67* 65* 61*  CREATININE 3.71* 3.41*  --  3.40* 3.25* 3.14* 3.12*  CALCIUM 8.7* 8.9  --  9.2 9.4 9.5 9.5  MG 2.3 2.3  --  2.2  --   --   --   PHOS 4.4 3.1  --  2.6  --   --   --    Liver Function Tests: No results for input(s): AST, ALT, ALKPHOS, BILITOT, PROT, ALBUMIN in the last 168 hours. Cardiac Enzymes: No results for input(s): CKTOTAL, CKMB, CKMBINDEX, TROPONINI in the last 168 hours. CBG: No results for input(s): GLUCAP in the last 168 hours.  Recent Results (from the past 240 hour(s))  SARS Coronavirus 2 (CEPHEID- Performed in [place] hospital lab), Hosp Order     Status: None   Collection Time: 05/19/19  9:35 AM  Result Value Ref Barton Status   SARS Coronavirus 2 NEGATIVE NEGATIVE Final    Comment: (NOTE) If result is NEGATIVE SARS-CoV-2 target nucleic acids are NOT DETECTED. The SARS-CoV-2 RNA is generally detectable in upper and lower  respiratory specimens during the acute phase of infection. The lowest  concentration of SARS-CoV-2 viral copies this assay can detect is 250  copies / mL. A negative result does not preclude SARS-CoV-2 infection  and should not be used as the sole basis for treatment or other  patient management decisions.  A negative result may occur with  improper specimen collection / handling, submission of specimen other  than nasopharyngeal swab, presence of viral mutation(s) within the  areas targeted by this assay, and inadequate number of viral copies  (<250 copies / mL). A negative result must be combined  with clinical  observations, patient history, and epidemiological information. If result is POSITIVE SARS-CoV-2 target nucleic acids are DETECTED. The SARS-CoV-2 RNA is generally detectable in upper and lower  respiratory specimens dur ing the acute phase of infection.  Positive  results are indicative of active infection with SARS-CoV-2.  Clinical  correlation with patient history and other diagnostic information is  necessary to determine patient infection status.  Positive results do  not rule out bacterial infection or co-infection with other viruses. If result is PRESUMPTIVE POSTIVE SARS-CoV-2 nucleic acids MAY BE PRESENT.   A presumptive positive result was obtained on the submitted specimen  and confirmed on repeat testing.  While 2019 novel coronavirus  (SARS-CoV-2) nucleic acids may be present in the submitted sample  additional confirmatory testing may be necessary for epidemiological  and / or clinical management purposes  to  differentiate between  SARS-CoV-2 and other Sarbecovirus currently known to infect humans.  If clinically indicated additional testing with an alternate test  methodology [phone]) is advised. The SARS-CoV-2 RNA is generally  detectable in upper and lower respiratory sp ecimens during the acute  phase of infection. The expected result is Negative. Fact Sheet for Patients:  StrictlyIdeas.no Fact Sheet for Healthcare Providers: BankingDealers.co.za This test is not yet approved or cleared by the [place] FDA and has been authorized for detection and/or diagnosis of SARS-CoV-2 by FDA under an Emergency Use Authorization (EUA).  This EUA will remain in effect (meaning this test can be used) for the duration of the COVID-19 declaration under Section 564(b)(1) of the Act, 21 U.S.C. section 360bbb-3(b)(1), unless the authorization is terminated or revoked sooner. Performed at [facility], [place] [address], [place], [place] [address]   Culture, blood (routine x 2)     Status: None   Collection Time: 05/19/19 11:38 AM  Result Value Ref Barton Status   Specimen Description BLOOD LEFT ANTECUBITAL  Final   Special Requests   Final    BOTTLES DRAWN AEROBIC AND ANAEROBIC Blood Culture adequate volume   Culture   Final    NO GROWTH 5 DAYS Performed at [facility], [place] [address], [place], [place] [address]    Report Status 05/24/2019 FINAL  Final  Culture, blood (routine x 2)     Status: None   Collection Time: 05/19/19 11:40 AM  Result Value Ref Barton Status   Specimen Description BLOOD RIGHT ANTECUBITAL  Final   Special Requests   Final    BOTTLES DRAWN AEROBIC AND ANAEROBIC Blood Culture results may not be optimal due to an inadequate volume of blood received in culture bottles   Culture   Final    NO GROWTH 5 DAYS Performed at [facility], [place] [address], [place], [place] [address]    Report Status 05/24/2019 FINAL  Final  MRSA PCR Screening     Status: None   Collection Time: 05/19/19  4:20 PM  Result Value Ref Barton Status   MRSA by PCR NEGATIVE NEGATIVE Final    Comment:        The GeneXpert MRSA Assay (FDA approved for NASAL specimens only), is one component of a comprehensive MRSA colonization surveillance program. It is not intended to diagnose MRSA infection nor to guide or monitor treatment for MRSA infections. Performed at [facility], [place] [address], [place], [place] [address]   Acid Fast Smear (AFB)     Status: None   Collection Time: 05/21/19  2:08 PM  Result Value Ref Barton Status   AFB Specimen Processing Concentration  Final   Acid Fast Smear Negative  Final    Comment: (NOTE) Performed At: [facility] [place], [place] [id number] Rush Farmer MD [id number]    Source (AFB) FLUID  Final    Comment: RIGHT PLEURAL Performed at [facility], [place] [address], [place], [place] [address]   Culture,  body fluid-bottle     Status: None   Collection Time: 05/21/19  2:08 PM  Result Value Ref Barton Status   Specimen Description FLUID  Final   Special Requests RIGHT PLEURAL  Final   Culture   Final    NO GROWTH 5 DAYS Performed at [facility], [address], [place],  [address]    Report Status 05/26/2019 FINAL  Final  Gram stain  Status: None   Collection Time: 05/21/19  2:08 PM  Result Value Ref Barton Status   Specimen Description FLUID  Final   Special Requests RIGHT PLEURAL  Final   Gram Stain   Final    ABUNDANT WBC PRESENT, PREDOMINANTLY PMN NO ORGANISMS SEEN Performed at [facility], [address], [place], [place] [address]    Report Status 05/21/2019 FINAL  Final         Radiology Studies: No results found.      Scheduled Meds: . allopurinol  100 mg Oral Daily  . amiodarone  200 mg Oral Daily  . apixaban  2.5 mg Oral BID  . bisacodyl  10 mg Oral Daily  . bisoprolol  5 mg Oral Daily  . budesonide (PULMICORT) nebulizer solution  0.25 mg Nebulization BID  . Chlorhexidine Gluconate Cloth  6 each Topical Daily  . ferrous sulfate  325 mg Oral Daily  . furosemide  40 mg Oral BID  . sodium chloride flush  3 mL Intravenous Q12H   Continuous Infusions: . sodium chloride       LOS: 9 days     Vernell Leep, MD, FACP, [facility]. [facility]  To contact the attending provider between 7A-7P or the covering provider during after hours 7P-7A, please log into the web site [email or web address] and access using universal [place] password for that web site. If you do not have the password, please call the hospital operator.  05/28/2019, 5:18 PM

## 2019-05-28 NOTE — Progress Notes (Signed)
Physical Therapy Treatment Patient Details Name: David Barton MRN: [id number] DOB: 1933-10-07 Today's Date: 05/28/2019    History of Present Illness Pt adm with acute on chronic hypoxic respiratory failure and found to have rt sided effusion. PMH - copd on home O2, afib, htn, ckd, colon CA, etoh abuse    PT Comments    Pt admitted with above diagnosis. Pt currently with functional limitations due to balance and endurance deficits. Pt was able to ambulate in hallway with RW with good stability overall.  Desat on RA at rest and with ambulation needing 4L to keep sats >87%.  Needs continued PT to incr endurance for activity.  Pt will benefit from skilled PT to increase their independence and safety with mobility to allow discharge to the venue listed below.     Follow Up Recommendations  CIR     Equipment Recommendations  None recommended by PT    Recommendations for Other Services       Precautions / Restrictions Precautions Precautions: Fall;Other (comment) Precaution Comments: watch O2 Restrictions Weight Bearing Restrictions: No    Mobility  Bed Mobility               General bed mobility comments: sitting in recliner   Transfers Overall transfer level: Needs assistance Equipment used: Rolling walker (2 wheeled) Transfers: Sit to/from Stand Sit to Stand: Min guard;Supervision         General transfer comment: sit to stand from recliner  Ambulation/Gait Ambulation/Gait assistance: Min guard Gait Distance (Feet): 180 Feet Assistive device: Rolling walker (2 wheeled) Gait Pattern/deviations: Step-through pattern;Decreased stride length;Trunk flexed Gait velocity: decr Gait velocity interpretation: <1.8 ft/sec, indicate of risk for recurrent falls General Gait Details: Assist for balance and support. Pt was 80% on RA at rest.  Pt amb on 3L down to 85% and 87% on 4L, returns to 95% with rest fairly quickly.    Stairs             Wheelchair  Mobility    Modified Rankin (Stroke Patients Only)       Balance Overall balance assessment: Needs assistance Sitting-balance support: No upper extremity supported;Feet supported Sitting balance-Leahy Scale: Fair     Standing balance support: Bilateral upper extremity supported Standing balance-Leahy Scale: Poor Standing balance comment: walker and min guard for static standing                            Cognition Arousal/Alertness: Awake/alert Behavior During Therapy: WFL for tasks assessed/performed Overall Cognitive Status: Within Functional Limits for tasks assessed                                        Exercises General Exercises - Lower Extremity Hip ABduction/ADduction: AROM;Both;10 reps;Standing Hip Flexion/Marching: AROM;Both;10 reps;Standing Toe Raises: AROM;Both;10 reps;Standing Mini-Sqauts: AROM;Both;10 reps;Standing Other Exercises Other Exercises: knee flexion x 10 in standing.     General Comments        Pertinent Vitals/Pain Pain Assessment: No/denies pain    Home Living                      Prior Function            PT Goals (current goals can now be found in the care plan section) Acute Rehab PT Goals Patient Stated Goal: get stronger Progress towards PT goals: Progressing  toward goals    Frequency    Min 3X/week      PT Plan Current plan remains appropriate    Co-evaluation              AM-PAC PT "6 Clicks" Mobility   Outcome Measure  Help needed turning from your back to your side while in a flat bed without using bedrails?: A Little Help needed moving from lying on your back to sitting on the side of a flat bed without using bedrails?: A Little Help needed moving to and from a bed to a chair (including a wheelchair)?: A Little Help needed standing up from a chair using your arms (e.g., wheelchair or bedside chair)?: A Little Help needed to walk in hospital room?: A Little Help  needed climbing 3-5 steps with a railing? : A Lot 6 Click Score: 17    End of Session Equipment Utilized During Treatment: Gait belt;Oxygen Activity Tolerance: Patient tolerated treatment well Patient left: in chair;with call bell/phone within reach Nurse Communication: Mobility status PT Visit Diagnosis: Unsteadiness on feet (R26.81);Other abnormalities of gait and mobility (R26.89);Muscle weakness (generalized) (M62.81)     Time: 2902-1115 PT Time Calculation (min) (ACUTE ONLY): 17 min  Charges:  $Gait Training: 8-22 mins                     [place] Pager:  [phone]  Office:  [place] 05/28/2019, 1:20 PM

## 2019-05-28 NOTE — Progress Notes (Addendum)
Progress Note  Patient Name: David Barton Date of Encounter: 05/28/2019  Primary Cardiologist: Quay Burow  Subjective   Pt feeling improved today. Remains SOB with communication. Denies chest pain. Plan for rehab if peer to peer accepted.   Inpatient Medications    Scheduled Meds: . allopurinol  100 mg Oral Daily  . amiodarone  200 mg Oral Daily  . apixaban  2.5 mg Oral BID  . bisacodyl  10 mg Oral Daily  . bisoprolol  5 mg Oral Daily  . budesonide (PULMICORT) nebulizer solution  0.25 mg Nebulization BID  . Chlorhexidine Gluconate Cloth  6 each Topical Daily  . ferrous sulfate  325 mg Oral Daily  . furosemide  40 mg Oral BID  . sodium chloride flush  3 mL Intravenous Q12H   Continuous Infusions: . sodium chloride     PRN Meds: sodium chloride, ipratropium-albuterol, nitroGLYCERIN, ondansetron (ZOFRAN) IV, sodium chloride flush   Vital Signs    Vitals:   05/28/19 0400 05/28/19 0724 05/28/19 0900 05/28/19 0928  BP:  110/69    Pulse:  99  100  Resp:    20  Temp:  97.8 F (36.6 C)    TempSrc:  Oral    SpO2: (!) 87% 95% 92% 91%  Weight:      Height:        Intake/Output Summary (Last 24 hours) at 05/28/2019 1028 Last data filed at 05/28/2019 0604 Gross per 24 hour  Intake -  Output 925 ml  Net -925 ml   Filed Weights   05/24/19 0455 05/25/19 0500 05/26/19 0332  Weight: 103 kg 101.5 kg 101.5 kg    Physical Exam   GEN: Well nourished, well developed, in no acute distress.  Neck: Supple, no JVD, carotid bruits, or masses. Cardiac: Irregularly irregular, no murmurs. Mild 1-2+BLE. Radials/DP/PT 1+ and equal bilaterally.  Respiratory:  Diminished bilaterally with crackles in upper lobes. Respirations regular  Skin: warm and dry, no rash. Neuro:  Strength and sensation are intact. Psych: AAOx3.  Normal affect.  Labs    Chemistry Recent Labs  Lab 05/25/19 0533 05/26/19 0546 05/27/19 0645  NA 145 145 144  K 4.9 4.8 4.3  CL 105 104 103  CO2 32  32 31  GLUCOSE 100* 100* 96  BUN 67* 65* 61*  CREATININE 3.25* 3.14* 3.12*  CALCIUM 9.4 9.5 9.5  GFRNONAA 17* 17* 18*  GFRAA 19* 20* 20*  ANIONGAP 8 9 10      Hematology Recent Labs  Lab 05/22/19 0307 05/23/19 0330 05/23/19 0341 05/24/19 0454  WBC 9.7 7.4  --  6.8  RBC 2.57* 2.48*  --  2.67*  HGB 8.1* 7.9* 7.8* 8.5*  HCT 27.6* 26.4* 23.0* 28.7*  MCV 107.4* 106.5*  --  107.5*  MCH 31.5 31.9  --  31.8  MCHC 29.3* 29.9*  --  29.6*  RDW 16.4* 16.3*  --  16.6*  PLT 198 197  --  221    Cardiac EnzymesNo results for input(s): TROPONINI in the last 168 hours. No results for input(s): TROPIPOC in the last 168 hours.   BNPNo results for input(s): BNP, PROBNP in the last 168 hours.   DDimer No results for input(s): DDIMER in the last 168 hours.   Radiology    No results found.  Telemetry    05/28/2019 AF with HR in the 80-90's  - Personally Reviewed  ECG    No new tracing as of 05/28/2019- Personally Reviewed  Cardiac Studies  Echocardiogram 05/21/2019: 1. The left ventricle has mildly reduced systolic function, with an ejection fraction of 45-50%. The cavity size was normal. There is mildly increased left ventricular wall thickness. Left ventricular diastolic Doppler parameters are indeterminate. 2. LV endocardium difficult to see (No Definity used). EF appears to be ~45-50% with hypokinesis of the inferolateral, inferoseptal and infero-apical myocardium. Consider repeat imaging with Definity to better assess. 3. Left atrial size was moderately dilated. 4. Right atrial size was moderately dilated. 5. Small pericardial effusion. 6. The pericardial effusion is posterior to the left ventricle and circumferential. 7. Mild calcification of the mitral valve leaflet. 8. The aortic valve is tricuspid. Moderate calcification of the aortic valve. No stenosis of the aortic valve. 9. The aortic root and ascending aorta are normal in size and structure. 10. The inferior vena  cava was dilated in size with <50% respiratory variability. 11. The interatrial septum was not well visualized.  Patient Profile     85 y.o. male with COPD and chronic hypoxic respiratory failure, CKD, nonischemic cardiomyopathy with prior normalization of LVEF, persistent atrial fibrillation/flutter status post failed cardioversion and community-acquired pneumonia.  Assessment & Plan    1. Acute systolic heart failure: -Echocardiogram from 05/21/2019 with LVEF of 45-50% -Has prior hx of nonischemic cardiomyopathy with normalized EF in the past  -Manage medically with BB, Lasix  -Not a candidate for ACE-I/ARB/aldactone in the setting of renal insufficiency  -Euvolemic on exam today   2. Persistent atrial flutter: -Failed TEE DCCVx 2 03/16/2019 -Repeat likely to be unsuccessful until underlying pulmonary issues addressed  -HR stable, 80-90's  -Continue amiodarone 200mg  daily  -Eliquis 2.5mg  BID, bisoprolol 5mg    3. CKD Stage IV: -Creatinine, 3.12 today with a baseline of 2.0-3.0 -Continue to avoid nephrotoxic medications -Lasix discontinue per primary team  -Not interested in pursuing HD   4. HCAP: -Suspected due to HCAP and right-sided pleural effusion secondary to aspiration? -On IV antibiotics per primary team  -Plan to be on home O2 supplementation with target saturation goal of 88-92% -Follow up CT per primary team    Signed, Kathyrn Drown NP-C [place] Pager: [phone] 05/28/2019, 10:28 AM    The patient was seen, examined and discussed with Kathyrn Drown, NP  and I agree with the above.   The patient feels back to baseline, improved SOB and LE edema, Crea stable. He remains in atrial flutter but rate controlled and anticoagulated. He will be discharged on Lasix 40 mg po BID, he is scheduled to see Dr Gwenlyn Found on 06/06/19.  Ena Dawley, MD 05/28/2019    For questions or updates, please contact   Please consult [email or web address] for contact info under  Cardiology/STEMI.

## 2019-05-28 NOTE — Progress Notes (Signed)
RN called RT regarding pt sats' dropping to the 40's while pt was asleep on a 3L [place]. RT instructed RN  to place pt on a NRB. RT went to assess pt, pt was on NRB satting 98%. RT placed pt on VM at 8L40%. Vitals are stable at this time.

## 2019-05-28 NOTE — Discharge Instructions (Signed)

## 2019-05-28 NOTE — Progress Notes (Signed)
MD notified of pt desatting to 58% while sleeping.  Pt's oxygen quickly increased to 80s and back to 97% 3L [place].

## 2019-05-29 DIAGNOSIS — G4733 Obstructive sleep apnea (adult) (pediatric): Secondary | ICD-10-CM

## 2019-05-29 MED ORDER — PNEUMOCOCCAL VAC POLYVALENT 25 MCG/0.5ML IJ INJ
0.5000 mL | INJECTION | INTRAMUSCULAR | Status: AC
  Administered 2019-05-30: 0.5 mL via INTRAMUSCULAR
  Filled 2019-05-29: qty 0.5

## 2019-05-29 NOTE — Progress Notes (Signed)
Physical Therapy Treatment Patient Details Name: David Barton MRN: [id number] DOB: [birth year] Today's Date: 05/29/2019    History of Present Illness Pt adm with acute on chronic hypoxic respiratory failure and found to have rt sided effusion. PMH - copd on home O2, afib, htn, ckd, colon CA, etoh abuse    PT Comments    Pt admitted with above diagnosis. Pt currently with functional limitations due to endurance deficits. Pt was able to ambulate with RW with incr distance and good stability.  Going home most likely today and is ready. Ambulated on 4LO2.  Did not want to exercise since he was going home.   Pt will benefit from skilled PT to increase their independence and safety with mobility to allow discharge to the venue listed below.     Follow Up Recommendations  Home health PT     Equipment Recommendations  None recommended by PT    Recommendations for Other Services       Precautions / Restrictions Precautions Precautions: Fall;Other (comment) Precaution Comments: watch O2 Restrictions Weight Bearing Restrictions: No    Mobility  Bed Mobility               General bed mobility comments: sitting in recliner   Transfers Overall transfer level: Needs assistance Equipment used: Rolling walker (2 wheeled) Transfers: Sit to/from Stand Sit to Stand: Min guard;Supervision         General transfer comment: sit to stand from recliner  Ambulation/Gait Ambulation/Gait assistance: Min guard Gait Distance (Feet): 300 Feet Assistive device: Rolling walker (2 wheeled) Gait Pattern/deviations: Step-through pattern;Decreased stride length;Trunk flexed Gait velocity: decr Gait velocity interpretation: <1.8 ft/sec, indicate of risk for recurrent falls General Gait Details: Assist for balance and support. Pt was 80% on RA at rest.  Pt amb on 3L down to 85% and 87% on 4L, returns to 95% with rest fairly quickly.    Stairs             Wheelchair Mobility     Modified Rankin (Stroke Patients Only)       Balance Overall balance assessment: Needs assistance Sitting-balance support: No upper extremity supported;Feet supported Sitting balance-Leahy Scale: Fair     Standing balance support: Bilateral upper extremity supported Standing balance-Leahy Scale: Poor Standing balance comment: walker and min guard for static standing                            Cognition Arousal/Alertness: Awake/alert Behavior During Therapy: WFL for tasks assessed/performed Overall Cognitive Status: Within Functional Limits for tasks assessed                                        Exercises      General Comments        Pertinent Vitals/Pain Pain Assessment: No/denies pain    Home Living                      Prior Function            PT Goals (current goals can now be found in the care plan section) Acute Rehab PT Goals Patient Stated Goal: to go to rehab to get stronger Progress towards PT goals: Progressing toward goals    Frequency    Min 3X/week      PT Plan Discharge plan needs to be updated  Co-evaluation              AM-PAC PT "6 Clicks" Mobility   Outcome Measure  Help needed turning from your back to your side while in a flat bed without using bedrails?: A Little Help needed moving from lying on your back to sitting on the side of a flat bed without using bedrails?: A Little Help needed moving to and from a bed to a chair (including a wheelchair)?: A Little Help needed standing up from a chair using your arms (e.g., wheelchair or bedside chair)?: A Little Help needed to walk in hospital room?: A Little Help needed climbing 3-5 steps with a railing? : A Lot 6 Click Score: 17    End of Session Equipment Utilized During Treatment: Gait belt;Oxygen Activity Tolerance: Patient tolerated treatment well Patient left: in chair;with call bell/phone within reach Nurse Communication:  Mobility status PT Visit Diagnosis: Unsteadiness on feet (R26.81);Other abnormalities of gait and mobility (R26.89);Muscle weakness (generalized) (M62.81)     Time: 9643-8381 PT Time Calculation (min) (ACUTE ONLY): 12 min  Charges:  $Gait Training: 8-22 mins                     [place] Pager:  [phone]  Office:  [place] 05/29/2019, 2:36 PM

## 2019-05-29 NOTE — Progress Notes (Signed)
NAME:  David Barton, MRN:  [id number], DOB:  [birth year], LOS: 10 ADMISSION DATE:  05/19/2019, CONSULTATION DATE:  5/30 REFERRING MD:  Algis Liming, CHIEF COMPLAINT:  Nocturnal desaturations.   Brief History   85 year old man with moderate COPD, obesity and HFrEF,  Followed by Dr Melvyn Novas. Prior admission for pneumonia and shock in March/April. Returned for increasing dyspnea.  Treated with antibiotics again. Parapneumonic effusion tapped.  Continue to have O2 requirements with significant desaturation at nighttime.  Unable to tolerate BiPAP due to tight fitting mask as well as sensation of pressurized air.  Past Medical History   Past Medical History:  Diagnosis Date  . Alcohol abuse   . Colon cancer ([place])   . COPD (chronic obstructive pulmonary disease) ([place])   . Emphysema of lung ([place])   . Former tobacco use   . Hypertension   . Peripheral edema   . Sepsis ([place]) 10/2016   Aspiration PNA/C diff colitis    Significant Diagnostic Tests:  CXR (personally reviewed):  Increased interstitial markings bilaterally. Improving R effusion. Persistent R mid ling infiltrate.    Interim history/subjective:  Patient unaware of desaturation and reports that he sleeps fine.  Objective   Blood pressure 107/68, pulse 99, temperature 97.6 F (36.4 C), temperature source Oral, resp. rate 17, height 5\' 10"  (1.778 m), weight 101.5 kg, SpO2 96 %.    FiO2 (%):  [40 %] 40 %   Intake/Output Summary (Last 24 hours) at 05/29/2019 1045 Last data filed at 05/29/2019 0547 Gross per 24 hour  Intake 0 ml  Output 350 ml  Net -350 ml   Filed Weights   05/24/19 0455 05/25/19 0500 05/26/19 0332  Weight: 103 kg 101.5 kg 101.5 kg    Examination: General: moderately obese, in no distress. Speaking in full sentences. HENT: Increased neck girth. Trachea is midline. Lungs: Normal chest excursion. Bibasilar crackles. Cardiovascular: Unable to see JVP. Summation gallop. No edema. Abdomen: protuberant but soft.  Extremities: No cyanosis. Neuro: No focal deficits, clear sensorium  Resolved Hospital Problem list   Parapneumonic effusion.   Assessment & Plan:   Acute on chronic respiratory insufficiency with baseline hypoxia and sleep disordered nocturnal breathing.  - would be best served with some form of NIV at nighttime at the maximum dose tolerated at present.  - can further titrate as outpatient to actual effective dose if necessary.  - probably would do best with nasal pillows. - optimize fluid status as HF can worsen sleep disordered breathing.  Recommend: - Hold discharge until can provide some form of tolerable NIV - Nocturnal autotitrating CPAP. - Would discharge on whatever dose he can tolerate and provides at least partial control of desaturations - Can fine tune as outpatient based on home sleep study.   Labs   CBC: Recent Labs  Lab 05/23/19 0330 05/23/19 0341 05/24/19 0454  WBC 7.4  --  6.8  HGB 7.9* 7.8* 8.5*  HCT 26.4* 23.0* 28.7*  MCV 106.5*  --  107.5*  PLT 197  --  203    Basic Metabolic Panel: Recent Labs  Lab 05/23/19 0330 05/23/19 0341 05/24/19 0454 05/25/19 0533 05/26/19 0546 05/27/19 0645  NA 140 139 144 145 145 144  K 4.3 4.3 4.9 4.9 4.8 4.3  CL 99  --  103 105 104 103  CO2 32  --  32 32 32 31  GLUCOSE 98  --  95 100* 100* 96  BUN 75*  --  69* 67* 65* 61*  CREATININE 3.41*  --  3.40* 3.25* 3.14* 3.12*  CALCIUM 8.9  --  9.2 9.4 9.5 9.5  MG 2.3  --  2.2  --   --   --   PHOS 3.1  --  2.6  --   --   --    GFR: Estimated Creatinine Clearance: 21.4 mL/min (A) (by C-G formula based on SCr of 3.12 mg/dL (H)). Recent Labs  Lab 05/23/19 0330 05/24/19 0454  WBC 7.4 6.8    Liver Function Tests: No results for input(s): AST, ALT, ALKPHOS, BILITOT, PROT, ALBUMIN in the last 168 hours. No results for input(s): LIPASE, AMYLASE in the last 168 hours. No results for input(s): AMMONIA in the last 168 hours.  ABG    Component Value Date/Time    PHART 7.417 05/23/2019 0341   PCO2ART 49.8 (H) 05/23/2019 0341   PO2ART 56.0 (L) 05/23/2019 0341   HCO3 32.2 (H) 05/23/2019 0341   TCO2 34 (H) 05/23/2019 0341   ACIDBASEDEF 3.0 (H) 03/05/2019 0317   O2SAT 89.0 05/23/2019 0341     Coagulation Profile: No results for input(s): INR, PROTIME in the last 168 hours.  Cardiac Enzymes: No results for input(s): CKTOTAL, CKMB, CKMBINDEX, TROPONINI in the last 168 hours.  HbA1C: Hgb A1c MFr Bld  Date/Time Value Ref Range Status  11/05/2016 03:07 AM 5.2 4.8 - 5.6 % Final    Comment:    (NOTE)         Pre-diabetes: 5.7 - 6.4         Diabetes: >6.4         Glycemic control for adults with diabetes: <7.0     CBG: No results for input(s): GLUCAP in the last 168 hours.   Kipp Brood, MD [facility] ICU Physician [place]  Pager: [phone] Mobile: [phone] After hours: [phone].  05/29/2019, 11:03 AM

## 2019-05-29 NOTE — Progress Notes (Signed)
Inpatient Rehab Admissions Coordinator:   I have again reached out to pt's insurance company for an update on peer-to-peer review with no response.  I will continue to attempt to contact them.   Shann Medal, PT, DPT Admissions Coordinator [phone] 05/29/19  10:12 AM

## 2019-05-29 NOTE — Progress Notes (Signed)
RT placed pt on cpap, pt immediately wanted it to be taken off, pt stated he could not tolerate it.. RT took pt off cpap and placed pt back on VM for the night.

## 2019-05-29 NOTE — Progress Notes (Signed)
Inpatient Rehab Admissions Coordinator:   Dr. Algis Liming was able to overturn insurance denial.  Pt is approved for inpatient rehab and will plan for admission tomorrow, pending PCCM trial of CPAP overnight per Dr. Lynetta Mare.   Shann Medal, PT, DPT Admissions Coordinator [phone] 05/29/19  4:08 PM

## 2019-05-29 NOTE — Progress Notes (Signed)
PROGRESS NOTE   David Barton  [name]:[id number]    DOB: Feb 05, 1933    DOA: 05/19/2019  PCP: Maury Dus, MD   I have briefly reviewed patients previous medical records in [facility].  Brief Narrative:  85 year old male with PMH of COPD, chronic hypoxic respiratory failure on home oxygen at 4 L/min, chronic systolic CHF with EF 76% in 2017 which subsequently normalized, A. fib/PSVT with history of TEE cardioversion, HTN, stage IV CKD, alcohol abuse, colon cancer who initially presented with chest pain and dyspnea.  Recent prolonged hospitalization in March/April for pneumonia, septic shock complicated by A. fib with RVR and worsening renal failure.  In ED developed progressive hypoxia, placed on BiPAP which patient not fully compliant with, DNR, CT chest 5/31 showed new right-sided consolidation with right moderate pleural effusion, high PCT, suspecting H CAP, status post thoracentesis 05/21/2019.  Cardiology consulted 6/3.  Patient was medically stable for discharge since 6/5 at which point he was waiting for insurance approval for CIR which was initially denied on 6/8 but approved after peer to peer MD discussion on 6/9.  Overnight 6/8, hypoxic in the 40s requiring NRBM> Ventimask, pulmonary reconsulted and will try nocturnal auto titrating CPAP tonight and hopefully discharge 6/10 to CIR with CPAP.  Assessment & Plan:   Principal Problem:   Acute on chronic respiratory failure ([name]) Active Problems:   AKI (acute kidney injury) ([place])   Atrial fibrillation with RVR ([name])   HCAP (healthcare-associated pneumonia)   Chronic respiratory failure with hypoxia ([name])   Acute on chronic hypoxic respiratory failure - Suspected due to HCAP and right-sided pleural effusion.  He may have underlying undiagnosed sleep apnea. - Has completed course of antibiotics (IV cefepime followed by oral Augmentin). - Blood cultures x2: Negative to date. - SARS coronavirus 2: Negative. - CT chest showed  worsening right sided consolidation and effusion and hence underwent thoracentesis, please see discussion below. - As per speech therapy, on regular diet. - Acetylcholine receptor antibody results negative. - As per update from rehab team who evaluated him during prior hospitalization, prior to recent hospitalization, he was independent and used oxygen 2 L/min.  After recent hospitalization, CIR was denied, returned home, ambulating with the help of a walker and was walking up to 50 yards and on oxygen 4 L/min. -Target for oxygen saturation between 89-92%. -Patient declining or unable to tolerate BiPAP more than a short time. -Weaned to oxygen 3 L/min at daytime. -Ongoing issues with nocturnal hypoxemia. -  Overnight 6/8, hypoxic in the 40s requiring NRBM> Ventimask, pulmonary reconsulted and will try nocturnal auto titrating CPAP tonight  -Requested pulmonary to follow-up in a.m.  Suspected right lower lobe HCAP versus likely aspiration pneumonia -Completed course of antibiotics - despite negative swallow evaluation, patient has had persistent RLL pneumonia despite multiple antibiotic courses and hence concern for aspiration pneumonia. -Recommend repeating CT chest in 4 to 6 weeks and outpatient follow-up arranged arranged by pulmonology.  Right-sided parapneumonic pleural effusion -Status post ultrasound-guided thoracentesis by IR on 6/1: Total of 1.85 L of dark red fluid was removed. - Pleural fluid: WBCs 1463, 82% neutrophils, protein 3.2, LDH 140, culture negative to date.  Reactive mesothelial cells and macrophages present, no atypia seen. -Suspected exudative?  Parapneumonic. -Completed antibiotic course -Chest x-ray 6/3 without sizable effusion.  Improved. -Improved and stable  Acute on stage IV chronic kidney disease -Baseline creatinine reportedly approximately 2.5. -On 6/1 creatinine up to 3.7.  Lasix discontinued.  Creatinine continues to gradually improve,  down to 3.12 today.  -Patient absolutely does not want dialysis. -Periodically follow BMP after discharge.  Acute systolic CHF/LVEF 28-41%/[id number]. -Last EF was 60-65%. -Clinically appears euvolemic.  Continue Lasix 40 mg twice daily. -Not a candidate for ACEI/ARB/Aldactone due to CKD.  CHF not felt to be primary etiology for patient's pleural effusion. -Improved and stable.  Continue current dose of Lasix and Zebeta.  A. fib/flutter with RVR -Failed TEE cardioversion on 03/16/2019. -Remains on amiodarone and Zebeta. -Cardizem discontinued -As per discussion with cardiology, cardioversion may be less likely to be successful until primary pulmonary processes addressed.   -IV heparin discontinued and started Eliquis due to chronic kidney disease. - Op f/u with Dr. Quay Burow, Cardiology 6/17  Elevated TSH -Possibly sick euthyroid.  Follow TSH in 4 weeks.  Gout -No acute flare presently.  Continue allopurinol prophylaxis.  Morbid obesity/Body mass index is 32.11 kg/m.  Suspected OSA Management as discussed above.  Trial of CPAP with auto titration tonight.  Macrocytic anemia Stable.  Received IV iron.  Continue oral iron. Patient reports that he he has an outpatient appointment to come to [facility] on 6/11 for IV iron by his Nephrologist.  This can be done at CIR.  Physical deconditioning -Plan for CIR admission, hopefully 6/10.  DVT prophylaxis: Eliquis Code Status: DNR Family Communication: I discussed in detail with patient's spouse 6/4, updated care and answered questions. Disposition: I did a peer to peer phone call with Dr. Morene Rankins from [facility] [employer] who kindly overturned insurance denial when patient was authorized for CIR admission.  Hopefully can DC to CIR 6/10.   Consultants:  PCCM Cardiology IR  Procedures:  Ultrasound guided thoracentesis 6/1  Antimicrobials:  IV cefepime-discontinued 6/4 Oral Augmentin 6/4 >completed   Subjective: Overnight events noted.  Patient states  that he had no complaints and was woken up from sleep due to low oxygen and was placed on mask.  Currently without complaints.  No dyspnea or pain.  ROS: As above  Objective:  Vitals:   05/28/19 2020 05/28/19 2324 05/29/19 0737 05/29/19 0804  BP: 104/65  107/68   Pulse: 96  99   Resp:   17   Temp: 97.9 F (36.6 C)  97.6 F (36.4 C)   TempSrc: Oral  Oral   SpO2: 91% 96% 93% 96%  Weight:      Height:        Examination:  General exam: Pleasant elderly male, moderately built and obese sitting up comfortably in chair. Respiratory system: Clear to auscultation.  No increased work of breathing. Cardiovascular system: S1 & S2 heard, RRR. No JVD, murmurs, rubs, gallops or clicks.  Trace bilateral ankle edema.  Telemetry personally reviewed: A. fib/flutter with controlled ventricular rate/BBB morphology. Gastrointestinal system: Abdomen is nondistended, soft and nontender. No organomegaly or masses felt. Normal bowel sounds heard.  Stable. Central nervous system: Alert and oriented. No focal neurological deficits.  Stable. Extremities: Symmetric 5 x 5 power. Skin: No rashes, lesions or ulcers Psychiatry: Judgement and insight appear normal. Mood & affect appropriate.     Data Reviewed: I have personally reviewed following labs and imaging studies  CBC: Recent Labs  Lab 05/23/19 0330 05/23/19 0341 05/24/19 0454  WBC 7.4  --  6.8  HGB 7.9* 7.8* 8.5*  HCT 26.4* 23.0* 28.7*  MCV 106.5*  --  107.5*  PLT 197  --  440   Basic Metabolic Panel: Recent Labs  Lab 05/23/19 0330 05/23/19 0341 05/24/19 0454 05/25/19 0533 05/26/19 0546 05/27/19 0645  NA 140 139 144 145 145 144  K 4.3 4.3 4.9 4.9 4.8 4.3  CL 99  --  103 105 104 103  CO2 32  --  32 32 32 31  GLUCOSE 98  --  95 100* 100* 96  BUN 75*  --  69* 67* 65* 61*  CREATININE 3.41*  --  3.40* 3.25* 3.14* 3.12*  CALCIUM 8.9  --  9.2 9.4 9.5 9.5  MG 2.3  --  2.2  --   --   --   PHOS 3.1  --  2.6  --   --   --    Liver  Function Tests: No results for input(s): AST, ALT, ALKPHOS, BILITOT, PROT, ALBUMIN in the last 168 hours. Cardiac Enzymes: No results for input(s): CKTOTAL, CKMB, CKMBINDEX, TROPONINI in the last 168 hours. CBG: No results for input(s): GLUCAP in the last 168 hours.  Recent Results (from the past 240 hour(s))  Acid Fast Smear (AFB)     Status: None   Collection Time: 05/21/19  2:08 PM  Result Value Ref Range Status   AFB Specimen Processing Concentration  Final   Acid Fast Smear Negative  Final    Comment: (NOTE) Performed At: [facility] [place], [place] [id number] Rush Farmer MD VQ:[id number]    Source (AFB) FLUID  Final    Comment: RIGHT PLEURAL Performed at [facility], [place] [address], [place], [place] [address]   Culture, body fluid-bottle     Status: None   Collection Time: 05/21/19  2:08 PM  Result Value Ref Range Status   Specimen Description FLUID  Final   Special Requests RIGHT PLEURAL  Final   Culture   Final    NO GROWTH 5 DAYS Performed at [facility], [address], [place], [place] [address]    Report Status 05/26/2019 FINAL  Final  Gram stain     Status: None   Collection Time: 05/21/19  2:08 PM  Result Value Ref Range Status   Specimen Description FLUID  Final   Special Requests RIGHT PLEURAL  Final   Gram Stain   Final    ABUNDANT WBC PRESENT, PREDOMINANTLY PMN NO ORGANISMS SEEN Performed at [facility], [place] [address], [place], [place] [address]    Report Status 05/21/2019 FINAL  Final         Radiology Studies: No results found.      Scheduled Meds: . allopurinol  100 mg Oral Daily  . amiodarone  200 mg Oral Daily  . apixaban  2.5 mg Oral BID  . bisacodyl  10 mg Oral Daily  . bisoprolol  5 mg Oral Daily  . budesonide (PULMICORT) nebulizer solution  0.25 mg Nebulization BID  . Chlorhexidine Gluconate Cloth  6 each Topical Daily  . ferrous sulfate  325 mg Oral Daily  . furosemide  40  mg Oral BID  . [START ON 05/30/2019] pneumococcal 23 valent vaccine  0.5 mL Intramuscular Tomorrow-1000  . sodium chloride flush  3 mL Intravenous Q12H   Continuous Infusions: . sodium chloride       LOS: 10 days     Vernell Leep, MD, FACP, [facility]  To contact the attending provider between 7A-7P or the covering provider during after hours 7P-7A, please log into the web site [email or web address] and access using universal [place] password for that web site. If you do not have the password, please call the hospital operator.  05/29/2019,  5:24 PM

## 2019-05-29 NOTE — Care Management Important Message (Signed)
Important Message  Patient Details  Name: David Barton MRN: [id number] Date of Birth: 1933-12-13   Medicare Important Message Given:  Yes    [name] 05/29/2019, 2:37 PM

## 2019-05-30 ENCOUNTER — Encounter (HOSPITAL_COMMUNITY): Payer: Self-pay

## 2019-05-30 ENCOUNTER — Inpatient Hospital Stay (HOSPITAL_COMMUNITY)
Admission: RE | Admit: 2019-05-30 | Discharge: 2019-06-07 | DRG: 945 | Disposition: A | Discharge status: Other Acute Inpatient Hospital | Payer: Medicare Other | Source: Intra-hospital | Attending: Physical Medicine & Rehabilitation | Admitting: Physical Medicine & Rehabilitation

## 2019-05-30 ENCOUNTER — Other Ambulatory Visit: Payer: Self-pay

## 2019-05-30 DIAGNOSIS — R627 Adult failure to thrive: Secondary | ICD-10-CM | POA: Diagnosis present

## 2019-05-30 DIAGNOSIS — E875 Hyperkalemia: Secondary | ICD-10-CM | POA: Diagnosis not present

## 2019-05-30 DIAGNOSIS — I480 Paroxysmal atrial fibrillation: Secondary | ICD-10-CM

## 2019-05-30 DIAGNOSIS — R0902 Hypoxemia: Secondary | ICD-10-CM | POA: Diagnosis not present

## 2019-05-30 DIAGNOSIS — I4891 Unspecified atrial fibrillation: Secondary | ICD-10-CM | POA: Diagnosis present

## 2019-05-30 DIAGNOSIS — D649 Anemia, unspecified: Secondary | ICD-10-CM | POA: Diagnosis present

## 2019-05-30 DIAGNOSIS — Z1159 Encounter for screening for other viral diseases: Secondary | ICD-10-CM | POA: Diagnosis not present

## 2019-05-30 DIAGNOSIS — E669 Obesity, unspecified: Secondary | ICD-10-CM | POA: Diagnosis present

## 2019-05-30 DIAGNOSIS — J90 Pleural effusion, not elsewhere classified: Secondary | ICD-10-CM | POA: Diagnosis present

## 2019-05-30 DIAGNOSIS — Z85038 Personal history of other malignant neoplasm of large intestine: Secondary | ICD-10-CM

## 2019-05-30 DIAGNOSIS — N186 End stage renal disease: Secondary | ICD-10-CM

## 2019-05-30 DIAGNOSIS — D72829 Elevated white blood cell count, unspecified: Secondary | ICD-10-CM

## 2019-05-30 DIAGNOSIS — Z66 Do not resuscitate: Secondary | ICD-10-CM | POA: Diagnosis not present

## 2019-05-30 DIAGNOSIS — I4811 Longstanding persistent atrial fibrillation: Secondary | ICD-10-CM

## 2019-05-30 DIAGNOSIS — J189 Pneumonia, unspecified organism: Secondary | ICD-10-CM | POA: Diagnosis present

## 2019-05-30 DIAGNOSIS — Z7901 Long term (current) use of anticoagulants: Secondary | ICD-10-CM

## 2019-05-30 DIAGNOSIS — R5381 Other malaise: Secondary | ICD-10-CM | POA: Diagnosis present

## 2019-05-30 DIAGNOSIS — R918 Other nonspecific abnormal finding of lung field: Secondary | ICD-10-CM

## 2019-05-30 DIAGNOSIS — I132 Hypertensive heart and chronic kidney disease with heart failure and with stage 5 chronic kidney disease, or end stage renal disease: Secondary | ICD-10-CM | POA: Diagnosis present

## 2019-05-30 DIAGNOSIS — J449 Chronic obstructive pulmonary disease, unspecified: Secondary | ICD-10-CM | POA: Diagnosis present

## 2019-05-30 DIAGNOSIS — Z6832 Body mass index (BMI) 32.0-32.9, adult: Secondary | ICD-10-CM

## 2019-05-30 DIAGNOSIS — I4892 Unspecified atrial flutter: Secondary | ICD-10-CM | POA: Diagnosis present

## 2019-05-30 DIAGNOSIS — Z9889 Other specified postprocedural states: Secondary | ICD-10-CM

## 2019-05-30 DIAGNOSIS — I5032 Chronic diastolic (congestive) heart failure: Secondary | ICD-10-CM | POA: Diagnosis present

## 2019-05-30 DIAGNOSIS — R0602 Shortness of breath: Secondary | ICD-10-CM

## 2019-05-30 DIAGNOSIS — Z515 Encounter for palliative care: Secondary | ICD-10-CM

## 2019-05-30 DIAGNOSIS — Z87891 Personal history of nicotine dependence: Secondary | ICD-10-CM | POA: Diagnosis not present

## 2019-05-30 DIAGNOSIS — I5043 Acute on chronic combined systolic (congestive) and diastolic (congestive) heart failure: Secondary | ICD-10-CM | POA: Diagnosis not present

## 2019-05-30 DIAGNOSIS — R531 Weakness: Secondary | ICD-10-CM

## 2019-05-30 DIAGNOSIS — J9621 Acute and chronic respiratory failure with hypoxia: Secondary | ICD-10-CM

## 2019-05-30 DIAGNOSIS — N179 Acute kidney failure, unspecified: Secondary | ICD-10-CM | POA: Diagnosis present

## 2019-05-30 DIAGNOSIS — N184 Chronic kidney disease, stage 4 (severe): Secondary | ICD-10-CM | POA: Diagnosis not present

## 2019-05-30 DIAGNOSIS — Z9981 Dependence on supplemental oxygen: Secondary | ICD-10-CM

## 2019-05-30 DIAGNOSIS — Z09 Encounter for follow-up examination after completed treatment for conditions other than malignant neoplasm: Secondary | ICD-10-CM

## 2019-05-30 DIAGNOSIS — D509 Iron deficiency anemia, unspecified: Secondary | ICD-10-CM | POA: Diagnosis present

## 2019-05-30 DIAGNOSIS — J9620 Acute and chronic respiratory failure, unspecified whether with hypoxia or hypercapnia: Secondary | ICD-10-CM | POA: Diagnosis present

## 2019-05-30 LAB — GLUCOSE, CAPILLARY: Glucose-Capillary: 113 mg/dL — ABNORMAL HIGH (ref 70–99)

## 2019-05-30 MED ORDER — FUROSEMIDE 40 MG PO TABS
40.0000 mg | ORAL_TABLET | Freq: Two times a day (BID) | ORAL | Status: DC
  Administered 2019-05-31: 09:00:00 40 mg via ORAL
  Filled 2019-05-30: qty 1

## 2019-05-30 MED ORDER — FLEET ENEMA 7-19 GM/118ML RE ENEM: 1.0000 | ENEMA | Freq: Once | RECTAL | Status: DC | PRN

## 2019-05-30 MED ORDER — DIPHENHYDRAMINE HCL 12.5 MG/5ML PO ELIX: 12.5000 mg | ORAL_SOLUTION | Freq: Four times a day (QID) | ORAL | Status: DC | PRN

## 2019-05-30 MED ORDER — BISACODYL 5 MG PO TBEC
10.0000 mg | DELAYED_RELEASE_TABLET | Freq: Every day | ORAL | Status: DC
  Administered 2019-05-31 – 2019-06-07 (×7): 10 mg via ORAL
  Filled 2019-05-30 (×8): qty 2

## 2019-05-30 MED ORDER — NITROGLYCERIN 0.4 MG SL SUBL: 0.4000 mg | SUBLINGUAL_TABLET | SUBLINGUAL | Status: DC | PRN

## 2019-05-30 MED ORDER — IPRATROPIUM-ALBUTEROL 0.5-2.5 (3) MG/3ML IN SOLN
3.0000 mL | Freq: Four times a day (QID) | RESPIRATORY_TRACT | Status: DC | PRN
  Administered 2019-05-31: 3 mL via RESPIRATORY_TRACT
  Filled 2019-05-30: qty 3

## 2019-05-30 MED ORDER — TRAZODONE HCL 50 MG PO TABS
25.0000 mg | ORAL_TABLET | Freq: Every evening | ORAL | Status: DC | PRN
  Administered 2019-06-06: 50 mg via ORAL
  Filled 2019-05-30: qty 1

## 2019-05-30 MED ORDER — PROCHLORPERAZINE MALEATE 5 MG PO TABS: 5.0000 mg | ORAL_TABLET | Freq: Four times a day (QID) | ORAL | Status: DC | PRN

## 2019-05-30 MED ORDER — APIXABAN 2.5 MG PO TABS
2.5000 mg | ORAL_TABLET | Freq: Two times a day (BID) | ORAL | Status: DC
  Administered 2019-05-30 – 2019-06-07 (×16): 2.5 mg via ORAL
  Filled 2019-05-30 (×16): qty 1

## 2019-05-30 MED ORDER — AMIODARONE HCL 200 MG PO TABS
200.0000 mg | ORAL_TABLET | Freq: Every day | ORAL | Status: DC
  Administered 2019-05-31 – 2019-06-07 (×8): 200 mg via ORAL
  Filled 2019-05-30 (×8): qty 1

## 2019-05-30 MED ORDER — ALUM & MAG HYDROXIDE-SIMETH 200-200-20 MG/5ML PO SUSP: 30.0000 mL | ORAL | Status: DC | PRN

## 2019-05-30 MED ORDER — BISACODYL 10 MG RE SUPP: 10.0000 mg | Freq: Every day | RECTAL | Status: DC | PRN

## 2019-05-30 MED ORDER — POLYETHYLENE GLYCOL 3350 17 G PO PACK: 17.0000 g | PACK | Freq: Every day | ORAL | Status: DC | PRN

## 2019-05-30 MED ORDER — CAMPHOR-MENTHOL 0.5-0.5 % EX LOTN
TOPICAL_LOTION | CUTANEOUS | Status: DC | PRN
  Filled 2019-05-30: qty 222

## 2019-05-30 MED ORDER — PROCHLORPERAZINE EDISYLATE 10 MG/2ML IJ SOLN: 5.0000 mg | Freq: Four times a day (QID) | INTRAMUSCULAR | Status: DC | PRN

## 2019-05-30 MED ORDER — FUROSEMIDE 40 MG PO TABS: 40.0000 mg | ORAL_TABLET | Freq: Two times a day (BID) | ORAL | 0 refills | Status: DC

## 2019-05-30 MED ORDER — HYDROCERIN EX CREA
TOPICAL_CREAM | Freq: Two times a day (BID) | CUTANEOUS | Status: DC
  Administered 2019-05-31: 1 via TOPICAL
  Administered 2019-06-01 – 2019-06-07 (×12): via TOPICAL
  Filled 2019-05-30 (×2): qty 113

## 2019-05-30 MED ORDER — ACETAMINOPHEN 325 MG PO TABS: 325.0000 mg | ORAL_TABLET | ORAL | Status: DC | PRN

## 2019-05-30 MED ORDER — BUDESONIDE 0.25 MG/2ML IN SUSP: 0.2500 mg | Freq: Two times a day (BID) | RESPIRATORY_TRACT | 0 refills | Status: DC

## 2019-05-30 MED ORDER — GUAIFENESIN-DM 100-10 MG/5ML PO SYRP: 5.0000 mL | ORAL_SOLUTION | Freq: Four times a day (QID) | ORAL | Status: DC | PRN

## 2019-05-30 MED ORDER — BUDESONIDE 0.25 MG/2ML IN SUSP
0.2500 mg | Freq: Two times a day (BID) | RESPIRATORY_TRACT | Status: DC
  Administered 2019-05-30 – 2019-06-07 (×16): 0.25 mg via RESPIRATORY_TRACT
  Filled 2019-05-30 (×16): qty 2

## 2019-05-30 MED ORDER — BISOPROLOL FUMARATE 5 MG PO TABS
5.0000 mg | ORAL_TABLET | Freq: Every day | ORAL | Status: DC
  Administered 2019-05-31 – 2019-06-04 (×5): 5 mg via ORAL
  Filled 2019-05-30 (×5): qty 1

## 2019-05-30 MED ORDER — FERROUS SULFATE 325 (65 FE) MG PO TABS
325.0000 mg | ORAL_TABLET | Freq: Every day | ORAL | Status: DC
  Administered 2019-05-31 – 2019-06-03 (×4): 325 mg via ORAL
  Filled 2019-05-30 (×4): qty 1

## 2019-05-30 MED ORDER — PROCHLORPERAZINE 25 MG RE SUPP: 12.5000 mg | Freq: Four times a day (QID) | RECTAL | Status: DC | PRN

## 2019-05-30 MED ORDER — APIXABAN 2.5 MG PO TABS: 2.5000 mg | ORAL_TABLET | Freq: Two times a day (BID) | ORAL | 0 refills | Status: DC

## 2019-05-30 MED ORDER — ALLOPURINOL 100 MG PO TABS
100.0000 mg | ORAL_TABLET | Freq: Every day | ORAL | Status: DC
  Administered 2019-05-31 – 2019-06-07 (×8): 100 mg via ORAL
  Filled 2019-05-30 (×8): qty 1

## 2019-05-30 NOTE — Discharge Summary (Signed)
Discharge Summary  David Barton WPY:[id number] DOB: June 13, 1933  PCP: Maury Dus, MD  Admit date: 05/19/2019 Discharge date: 05/30/2019  Time spent: 35 minutes  Recommendations for Outpatient Follow-up:  1. Continue physical therapy at inpatient rehab.  Discharge Diagnoses:  Active Hospital Problems   Diagnosis Date Noted   Acute on chronic respiratory failure ([place]) 05/19/2019   HCAP (healthcare-associated pneumonia) 04/25/2019   Chronic respiratory failure with hypoxia ([place]) 04/25/2019   Atrial fibrillation with RVR ([place]) 03/11/2019   AKI (acute kidney injury) ([place]) 11/04/2016    Resolved Hospital Problems   Diagnosis Date Noted Date Resolved   Community acquired pneumonia of right lung 11/03/2016 05/21/2019    Discharge Condition: Stable  Diet recommendation: Resume previous diet, heart healthy diet.  Vitals:   05/30/19 0821 05/30/19 0831  BP:    Pulse:    Resp:    Temp:    SpO2: 94% 93%    History of present illness:  85 year old male with PMH of COPD, chronic hypoxic respiratory failure on home oxygen at 4 L/min, chronic systolic CHF with EF 05% in 2017 which subsequently normalized, A. fib/PSVT with history of TEE cardioversion, HTN, stage IV CKD, alcohol abuse, colon cancer who initially presented with chest pain and dyspnea.  Recent prolonged hospitalization in March/April for pneumonia, septic shock complicated by A. fib with RVR and worsening renal failure.  In ED developed progressive hypoxia, placed on BiPAP which patient not fully compliant with, DNR, CT chest 5/31 showed new right-sided consolidation with right moderate pleural effusion, high PCT, suspecting H CAP, status post thoracentesis 05/21/2019.  Cardiology consulted 6/3.  Patient was medically stable for discharge since 6/5 at which point he was waiting for insurance approval for CIR which was initially denied on 6/8 but approved after peer to peer MD discussion on 6/9.  Overnight 6/8, hypoxic in  the 40s requiring NRBM> Ventimask, pulmonary reconsulted and will try nocturnal auto titrating CPAP tonight and hopefully discharge 6/10 to CIR with CPAP.  05/30/19: Patient seen and examined at his bedside.  Declined CPAP at night due to claustrophobia.  Tolerated Ventimask.  No new concerns.  Labs reviewed and are stable.  On the day of discharge, the patient was hemodynamically stable.  He will need to continue physical therapy at inpatient rehab.  Hospital Course:  Principal Problem:   Acute on chronic respiratory failure ([name]) Active Problems:   AKI (acute kidney injury) ([place])   Atrial fibrillation with RVR ([name])   HCAP (healthcare-associated pneumonia)   Chronic respiratory failure with hypoxia ([name])  Acute on chronic hypoxic respiratory failure -Likely multifactorial secondary to HCAP and right-sided pleural effusion.  He may have underlying undiagnosed sleep apnea. - Has completed course of antibiotics (IV cefepime followed by oral Augmentin). - Blood cultures x2: Negative to date. - SARS coronavirus 2: Negative. - CT chest showed worsening right sided consolidation and effusion and hence underwent thoracentesis, please see discussion below. - As per speech therapy, on regular diet. - Acetylcholine receptor antibody results negative. - As per update from rehab team who evaluated him during prior hospitalization, prior to recent hospitalization, he was independent and used oxygen 2 L/min.  After recent hospitalization, CIR was denied, returned home, ambulating with the help of a walker and was walking up to 50 yards and on oxygen 4 L/min. -Target for oxygen saturation between 89-92%. -Patient declining or unable to tolerate BiPAP more than a short time. -Weaned to oxygen 3 L/min at daytime. -Ongoing issues with nocturnal hypoxemia. -  Overnight 6/8, hypoxic in the 40s requiring NRBM> Ventimask, pulmonary reconsulted and will try nocturnal auto titrating CPAP tonight  -Patient  declines CPAP due to claustrophobia. -Continue with current oxygen prescription in CIR as recommended by pulmonology.  Suspected right lower lobe HCAP versus likely aspiration pneumonia -Completed course of antibiotics -Negative swallow evaluation. -Recommend repeating CT chest in 4 to 6 weeks and outpatient follow-up arranged arranged by pulmonology.  Right-sided parapneumonic pleural effusion -Status post ultrasound-guided thoracentesis by IR on 6/1: Total of 1.85 L of dark red fluid was removed. - Pleural fluid: WBCs 1463, 82% neutrophils, protein 3.2, LDH 140, culture negative to date.  Reactive mesothelial cells and macrophages present, no atypia seen. -Suspected exudative?  Parapneumonic. -Completed antibiotic course -Chest x-ray 6/3 without sizable effusion.  Improved. -Improved and stable -Independently reviewed chest x-ray done on 05/23/2019 which showed bibasilar infiltrates right greater than left.  Completed course of antibiotics.  Acute on stage IV chronic kidney disease -Baseline creatinine reportedly approximately 2.5. -On 6/1 creatinine up to 3.7.  Lasix discontinued.  Creatinine continues to gradually improve, down to 3.12 today. -Patient absolutely does not want dialysis. -Periodically follow BMP after discharge.  Acute systolic CHF/LVEF 62-83%/[id number]. -Last EF was 60-65%. -Clinically appears euvolemic.  Continue Lasix 40 mg twice daily. -Not a candidate for ACEI/ARB/Aldactone due to CKD.  CHF not felt to be primary etiology for patient's pleural effusion. -Improved and stable.  Continue current dose of Lasix and Zebeta.  A. fib/flutter with RVR -Failed TEE cardioversion on 03/16/2019. -Remains on amiodarone and Zebeta. -Cardizem discontinued -As per discussion with cardiology, cardioversion may be less likely to be successful until primary pulmonary processes addressed.   -IV heparin discontinued and started Eliquis due to chronic kidney disease. - Op  f/u with Dr. Quay Burow, Cardiology 6/17  Elevated TSH -Likely sick euthyroid.  Follow TSH in 4 weeks.  Gout -No acute flare presently.  Continue allopurinol prophylaxis.  Morbid obesity/Body mass index is 32.11 kg/m.  Likely undiagnosed OSA Management as discussed above.  Trial of CPAP with auto titration tonight.  Macrocytic anemia Stable.  Received IV iron.  Continue oral iron. Patient reports that he he has an outpatient appointment to come to [facility] on 6/11 for IV iron by his Nephrologist.  This can be done at CIR.  Physical deconditioning -Continue physical therapy at CIR.   Code Status: DNR    Consultants:  PCCM Cardiology IR CIR  Procedures:  Ultrasound guided thoracentesis 6/1  Antimicrobials:  IV cefepime-discontinued 6/4 Oral Augmentin 6/4 >completed     Discharge Exam: BP 101/60 (BP Location: Right Arm)    Pulse 89    Temp 97.6 F (36.4 C) (Oral)    Resp 18    Ht 5\' 10"  (1.778 m)    Wt 102.2 kg    SpO2 93%    BMI 32.33 kg/m   General: 85 y.o. year-old male well developed well nourished in no acute distress.  Alert and oriented x3.  Cardiovascular: Regular rate and rhythm with no rubs or gallops.  No thyromegaly or JVD noted.    Respiratory: Faint rales at bases with no wheezes noted.  Good inspiratory effort.  Abdomen: Obese with normal bowel sounds x4 quadrants.  Musculoskeletal: Trace lower extremity edema. 2/4 pulses in all 4 extremities.  Psychiatry: Mood is appropriate for condition and setting  Discharge Instructions You were cared for by a hospitalist during your hospital stay. If you have any questions about your discharge medications or the care you received  while you were in the hospital after you are discharged, you can call the unit and asked to speak with the hospitalist on call if the hospitalist that took care of you is not available. Once you are discharged, your primary care physician will handle any further medical  issues. Please note that NO REFILLS for any discharge medications will be authorized once you are discharged, as it is imperative that you return to your primary care physician (or establish a relationship with a primary care physician if you do not have one) for your aftercare needs so that they can reassess your need for medications and monitor your lab values.   Allergies as of 05/30/2019      Reactions   Flexeril [cyclobenzaprine] Other (See Comments)   Unknown reaction       Medication List    STOP taking these medications   diltiazem 120 MG 24 hr capsule Commonly known as:  CARDIZEM CD   Rivaroxaban 15 MG Tabs tablet Commonly known as:  XARELTO     TAKE these medications   allopurinol 100 MG tablet Commonly known as:  ZYLOPRIM Take 100 mg by mouth daily.   amiodarone 200 MG tablet Commonly known as:  PACERONE Take 1 tablet (200 mg total) by mouth daily.   apixaban 2.5 MG Tabs tablet Commonly known as:  ELIQUIS Take 1 tablet (2.5 mg total) by mouth 2 (two) times daily.   bisoprolol 5 MG tablet Commonly known as:  ZEBETA Take 5 mg by mouth daily.   budesonide 0.25 MG/2ML nebulizer solution Commonly known as:  PULMICORT Take 2 mLs (0.25 mg total) by nebulization 2 (two) times daily.   ferrous sulfate 325 (65 FE) MG tablet Take 325 mg by mouth daily.   furosemide 40 MG tablet Commonly known as:  LASIX Take 1 tablet (40 mg total) by mouth 2 (two) times daily. What changed:    how much to take  when to take this   ipratropium-albuterol 0.5-2.5 (3) MG/3ML Soln Commonly known as:  DUONEB Use twice a day scheduled and every 4 hours as needed for shortness of breath and wheezing   multivitamin with minerals Tabs tablet Take 1 tablet by mouth daily.   OXYGEN Inhale 4 L into the lungs continuous.      Allergies  Allergen Reactions   Flexeril [Cyclobenzaprine] Other (See Comments)    Unknown reaction    Follow-up Information    Lorretta Harp, MD  Follow up.   Specialties:  Cardiology, Radiology Why:  [facility]- keep appointment as scheduled with Dr. Gwenlyn Found for 06/06/19 at 10:15am. Please arrive 15 minutes prior to appointment to check in. Please let office know if still in CIR at that time. Contact information: [address] [place] [address] [phone]            The results of significant diagnostics from this hospitalization (including imaging, microbiology, ancillary and laboratory) are listed below for reference.    Significant Diagnostic Studies: Dg Chest 1 View  Result Date: 05/21/2019 CLINICAL DATA:  Status post right thoracentesis EXAM: CHEST  1 VIEW COMPARISON:  05/20/2019 CT FINDINGS: Cardiac shadow is enlarged but stable. Mild atelectasis is noted in the right mid and lower lung. No pneumothorax is noted following thoracentesis. No bony abnormality is seen. IMPRESSION: No pneumothorax following right-sided thoracentesis. Mild atelectatic changes on the right. Electronically Signed   By: Inez Catalina M.D.   On: 05/21/2019 14:30   Dg Chest 2 View  Result  Date: 05/04/2019 CLINICAL DATA:  Healthcare assisted pneumonia. EXAM: CHEST - 2 VIEW COMPARISON:  04/24/2019 FINDINGS: Interval improvement in right upper lobe pneumonia. Mild right lower lobe airspace disease and small right effusion unchanged. Mild left lower lobe airspace disease unchanged. Small left effusion noted on prior CT. Negative for heart failure. IMPRESSION: Significant improvement in right upper lobe pneumonia. Persistent right lower lobe airspace disease and small right effusion Mild left lower lobe airspace disease and small left effusion unchanged. Electronically Signed   By: Franchot Gallo M.D.   On: 05/04/2019 15:54   Ct Chest Wo Contrast  Result Date: 05/20/2019 CLINICAL DATA:  Unresolved pneumonia EXAM: CT CHEST WITHOUT CONTRAST TECHNIQUE: Multidetector CT imaging of the chest was performed following the standard protocol  without IV contrast. COMPARISON:  03/19/2019 FINDINGS: Cardiovascular: Atherosclerotic calcifications of the aortic arch, great vessels, and coronary arteries are noted. No evidence of aortic aneurysm. Mediastinum/Nodes: No abnormal mediastinal adenopathy. Visualized thyroid is unremarkable. Small pericardial effusion is new. Lungs/Pleura: Left lower lobe consolidation and left pleural effusion are improved. Right lower lobe consolidation has markedly worsened. A moderate right pleural effusion has worsened. There is also patchy airspace disease throughout the right upper lobe. Subsegmental atelectasis in the right middle lobe. No pneumothorax. Centrilobular emphysema towards the lung apices. Upper Abdomen: No acute abnormality. Musculoskeletal: No vertebral compression deformity. IMPRESSION: Marked worsening consolidation in the right upper and lower lobes. Improved left lower lobe consolidation and left pleural effusion Worsening right pleural effusion which is moderate. New small pericardial effusion. Aortic Atherosclerosis (ICD10-I70.0) and Emphysema (ICD10-J43.9). Electronically Signed   By: Marybelle Killings M.D.   On: 05/20/2019 13:18   Dg Chest Port 1 View  Result Date: 05/23/2019 CLINICAL DATA:  Right-sided pleural effusion EXAM: PORTABLE CHEST 1 VIEW COMPARISON:  05/21/2019 FINDINGS: Cardiac shadow is stable. Thickening is noted along the minor fissure on the right stable from the prior exam. Mild right basilar atelectasis is seen. No new focal abnormality is noted. No pneumothorax is seen. IMPRESSION: Stable changes on the right. No pneumothorax or sizable effusion is seen. Electronically Signed   By: Inez Catalina M.D.   On: 05/23/2019 07:54   Dg Chest Port 1 View  Result Date: 05/20/2019 CLINICAL DATA:  Pneumonia EXAM: PORTABLE CHEST 1 VIEW COMPARISON:  05/19/2019 FINDINGS: Small to moderate right pleural effusion, similar. Associated right lower lobe opacity, likely atelectasis. Trace left pleural  effusion. Left lung is otherwise clear. No pneumothorax is seen. Cardiomegaly. IMPRESSION: Small to moderate right pleural effusion and trace left pleural effusion, grossly unchanged. Associated right lower lobe opacity, likely atelectasis. Electronically Signed   By: Julian Hy M.D.   On: 05/20/2019 05:05   Dg Chest Portable 1 View  Result Date: 05/19/2019 CLINICAL DATA:  Chest pain EXAM: PORTABLE CHEST 1 VIEW COMPARISON:  05/04/2019 FINDINGS: There is increasing opacity at the right lung base with shift of the mediastinum to the right most consistent with increasing collapse in the right lower lobe. Heterogeneous opacities throughout the right lung are stable. Increasing heterogeneous opacities at the left base. Right pleural effusion is suspected. IMPRESSION: Increasing collapse in the right lower lobe. Stable opacities throughout the right lung. Increasing pulmonary opacities at the left lung base. Small right pleural effusion is suspected. Electronically Signed   By: Marybelle Killings M.D.   On: 05/19/2019 10:13   Dg Swallowing Func-speech Pathology  Result Date: 05/21/2019 Objective Swallowing Evaluation: Type of Study: MBS-Modified Barium Swallow Study  Patient Details Name: DECODA VAN MRN:  [id number] Date of Birth: March 11, 1933 Today's Date: 05/21/2019 Time: SLP Start Time (ACUTE ONLY): 1300 -SLP Stop Time (ACUTE ONLY): 1315 SLP Time Calculation (min) (ACUTE ONLY): 15 min Past Medical History: Past Medical History: Diagnosis Date  Alcohol abuse   Colon cancer ([place])   COPD (chronic obstructive pulmonary disease) ([place])   Emphysema of lung ([place])   Former tobacco use   Hypertension   Peripheral edema   Sepsis ([place]) 10/2016  Aspiration PNA/C diff colitis Past Surgical History: Past Surgical History: Procedure Laterality Date  CARDIOVERSION N/A 03/16/2019  Procedure: CARDIOVERSION;  Surgeon: Pixie Casino, MD;  Location[facility] ENDOSCOPY;  Service: Cardiovascular;  Laterality: N/A;  CARDIOVERSION  N/A 03/23/2019  Procedure: CARDIOVERSION;  Surgeon: Lelon Perla, MD;  Location: [facility] ENDOSCOPY;  Service: Cardiovascular;  Laterality: N/A;  CATARACT EXTRACTION    COLON RESECTION    COLONOSCOPY    IMPLANTATION BONE ANCHORED HEARING AID Left 2016  MINOR HEMORRHOIDECTOMY  1995  TEE WITHOUT CARDIOVERSION N/A 03/16/2019  Procedure: TRANSESOPHAGEAL ECHOCARDIOGRAM (TEE);  Surgeon: Pixie Casino, MD;  Location: [facility] ENDOSCOPY;  Service: Cardiovascular;  Laterality: N/A;  TONSILLECTOMY   HPI: Patient is an 85 y.o. male with PMH: COPD: oxygen dependent on 4L at home, atrial fibrillation on Xarelto, diastolic heart failure, cardiomyopathy, chronic kidney disease, colon cancer, ETOH abuse, who also had a prolonged hospitalization from March to April for septic shock. He arrived at the hospital on 5/30 with mid-sternal chest pain radiating to the neck and 2 week h/o worsening breathing. CXR revealed RLL collapse, mediastinal shift to the right, stable opacities in right lung, increasing opacities in LL base.  Repeat CXR revealed small to moderate right pleural effusion and trace left pleural effusion.  Subjective: pleasant, no c/o swallow problems Assessment / Plan / Recommendation CHL IP CLINICAL IMPRESSIONS 05/21/2019 Clinical Impression Patient presents with an oropharyngeal swallow that is WFL-WNL without aspiration or penetration even when taking large volume sips of thin liquids via cup and straw sips. He exhbited a trace amount of vallecular residuals, but these fully cleared with spontaneous swallowing.  SLP Visit Diagnosis Dysphagia, unspecified (R13.10) Attention and concentration deficit following -- Frontal lobe and executive function deficit following -- Impact on safety and function No limitations   CHL IP TREATMENT RECOMMENDATION 05/21/2019 Treatment Recommendations No treatment recommended at this time   Prognosis 05/21/2019 Prognosis for Safe Diet Advancement Good Barriers to Reach Goals --  Barriers/Prognosis Comment -- CHL IP DIET RECOMMENDATION 05/21/2019 SLP Diet Recommendations Regular solids;Thin liquid Liquid Administration via Cup;Straw Medication Administration Whole meds with liquid Compensations -- Postural Changes --   No flowsheet data found.  CHL IP FOLLOW UP RECOMMENDATIONS 05/21/2019 Follow up Recommendations None   No flowsheet data found.     CHL IP ORAL PHASE 05/21/2019 Oral Phase WFL Oral - Pudding Teaspoon -- Oral - Pudding Cup -- Oral - Honey Teaspoon -- Oral - Honey Cup -- Oral - Nectar Teaspoon -- Oral - Nectar Cup -- Oral - Nectar Straw -- Oral - Thin Teaspoon -- Oral - Thin Cup -- Oral - Thin Straw -- Oral - Puree -- Oral - Mech Soft -- Oral - Regular -- Oral - Multi-Consistency -- Oral - Pill -- Oral Phase - Comment --  CHL IP PHARYNGEAL PHASE 05/21/2019 Pharyngeal Phase WFL Pharyngeal- Pudding Teaspoon -- Pharyngeal -- Pharyngeal- Pudding Cup -- Pharyngeal -- Pharyngeal- Honey Teaspoon -- Pharyngeal -- Pharyngeal- Honey Cup -- Pharyngeal -- Pharyngeal- Nectar Teaspoon -- Pharyngeal -- Pharyngeal- Nectar Cup -- Pharyngeal -- Pharyngeal-  Nectar Straw -- Pharyngeal -- Pharyngeal- Thin Teaspoon -- Pharyngeal -- Pharyngeal- Thin Cup -- Pharyngeal -- Pharyngeal- Thin Straw -- Pharyngeal -- Pharyngeal- Puree -- Pharyngeal -- Pharyngeal- Mechanical Soft -- Pharyngeal -- Pharyngeal- Regular -- Pharyngeal -- Pharyngeal- Multi-consistency -- Pharyngeal -- Pharyngeal- Pill -- Pharyngeal -- Pharyngeal Comment --  CHL IP CERVICAL ESOPHAGEAL PHASE 05/21/2019 Cervical Esophageal Phase WFL Pudding Teaspoon -- Pudding Cup -- Honey Teaspoon -- Honey Cup -- Nectar Teaspoon -- Nectar Cup -- Nectar Straw -- Thin Teaspoon -- Thin Cup -- Thin Straw -- Puree -- Mechanical Soft -- Regular -- Multi-consistency -- Pill -- Cervical Esophageal Comment -- [name] 05/21/2019, 1:47 PM Sonia Baller, MA, CCC-SLP Speech Therapy [facility] Acute Rehab Pager: [phone]              Ir Thoracentesis Asp Pleural  Space W/img Guide  Result Date: 05/21/2019 INDICATION: Patient with history of COPD, emphysema, acute on chronic hypoxic and hypercapnic respiratory failure, and right pleural effusion. Request is made for diagnostic and therapeutic right thoracentesis. EXAM: ULTRASOUND GUIDED DIAGNOSTIC AND THERAPEUTIC RIGHT THORACENTESIS MEDICATIONS: 10 mL 1% lidocaine COMPLICATIONS: None immediate. PROCEDURE: An ultrasound guided thoracentesis was thoroughly discussed with the patient and questions answered. The benefits, risks, alternatives and complications were also discussed. The patient understands and wishes to proceed with the procedure. Written consent was obtained. Ultrasound was performed to localize and mark an adequate pocket of fluid in the right chest. The area was then prepped and draped in the normal sterile fashion. 1% Lidocaine was used for local anesthesia. Under ultrasound guidance a 6 Fr Safe-T-Centesis catheter was introduced. Thoracentesis was performed. The catheter was removed and a dressing applied. FINDINGS: A total of approximately 1.85 L of dark red fluid was removed. Samples were sent to the laboratory as requested by the clinical team. IMPRESSION: Successful ultrasound guided right thoracentesis yielding 1.85 L of pleural fluid. Read by: Earley Abide, PA-C Electronically Signed   By: Jerilynn Mages.  Shick M.D.   On: 05/21/2019 14:31    Microbiology: Recent Results (from the past 240 hour(s))  Acid Fast Smear (AFB)     Status: None   Collection Time: 05/21/19  2:08 PM  Result Value Ref Range Status   AFB Specimen Processing Concentration  Final   Acid Fast Smear Negative  Final    Comment: (NOTE) Performed At: [facility] [place], [place] [id number] Rush Farmer MD UQ:[id number]    Source (AFB) FLUID  Final    Comment: RIGHT PLEURAL Performed at [facility], [place] [address] [place], [place] [address]   Culture, body fluid-bottle     Status: None    Collection Time: 05/21/19  2:08 PM  Result Value Ref Range Status   Specimen Description FLUID  Final   Special Requests RIGHT PLEURAL  Final   Culture   Final    NO GROWTH 5 DAYS Performed at [facility], [address] [place], [place] [address]    Report Status 05/26/2019 FINAL  Final  Gram stain     Status: None   Collection Time: 05/21/19  2:08 PM  Result Value Ref Range Status   Specimen Description FLUID  Final   Special Requests RIGHT PLEURAL  Final   Gram Stain   Final    ABUNDANT WBC PRESENT, PREDOMINANTLY PMN NO ORGANISMS SEEN Performed at [facility], [place] [address] [place], [place] [address]    Report Status 05/21/2019 FINAL  Final  Labs: Basic Metabolic Panel: Recent Labs  Lab 05/24/19 0454 05/25/19 0533 05/26/19 0546 05/27/19 0645  NA 144 145 145 144  K 4.9 4.9 4.8 4.3  CL 103 105 104 103  CO2 32 32 32 31  GLUCOSE 95 100* 100* 96  BUN 69* 67* 65* 61*  CREATININE 3.40* 3.25* 3.14* 3.12*  CALCIUM 9.2 9.4 9.5 9.5  MG 2.2  --   --   --   PHOS 2.6  --   --   --    Liver Function Tests: No results for input(s): AST, ALT, ALKPHOS, BILITOT, PROT, ALBUMIN in the last 168 hours. No results for input(s): LIPASE, AMYLASE in the last 168 hours. No results for input(s): AMMONIA in the last 168 hours. CBC: Recent Labs  Lab 05/24/19 0454  WBC 6.8  HGB 8.5*  HCT 28.7*  MCV 107.5*  PLT 221   Cardiac Enzymes: No results for input(s): CKTOTAL, CKMB, CKMBINDEX, TROPONINI in the last 168 hours. BNP: BNP (last 3 results) Recent Labs    03/14/19 0405 03/20/19 0431 05/19/19 0901  BNP 457.1* 191.2* 465.7*    ProBNP (last 3 results) Recent Labs    05/04/19 1137  PROBNP 443.0*    CBG: No results for input(s): GLUCAP in the last 168 hours.     Signed:  Kayleen Memos, MD [facility] 05/30/2019, 1:45 PM

## 2019-05-30 NOTE — Progress Notes (Signed)
Patient arrived to 5N 13 via wheelchair accompanied by RN & NT.  Patient alert and oriented x 4. Patient oriented to milieu and unit specific protocols. Fall Safety packet reviewed with Patient verbalizing understanding.   Personal Belongings: Engineer, materials (located inside Patent's room closet).

## 2019-05-30 NOTE — Progress Notes (Signed)
Occupational Therapy Treatment Patient Details Name: David Barton MRN: [id number] DOB: May 29, 1933 Today's Date: 05/30/2019    History of present illness Pt adm with acute on chronic hypoxic respiratory failure and found to have rt sided effusion. PMH - copd on home O2, afib, htn, ckd, colon CA, etoh abuse   OT comments  Pt continues to demonstrate good motivation to progress towards PLOF, presents up in recliner pleasant and with appropriate questions at start of session regarding expected CIR admission/stay. Questions answered throughout. Pt demonstrating sit<>stand at supervision level; further reviewed UE exercise using level 1 theraband with additional exercise provided for shoulder external rotation. Pt reports baseline R shoulder difficulties. Pt on 4L O2 during session with sats maintaining >92%. Expecting discharge to CIR today. Will continue to follow while he remains in acute setting.   Follow Up Recommendations  CIR    Equipment Recommendations  Other (comment)(TBD)          Precautions / Restrictions Precautions Precautions: Fall;Other (comment) Precaution Comments: watch O2 Restrictions Weight Bearing Restrictions: No       Mobility Bed Mobility               General bed mobility comments: sitting in recliner   Transfers Overall transfer level: Needs assistance Equipment used: None Transfers: Sit to/from Stand Sit to Stand: Min guard;Supervision         General transfer comment: sit to stand from recliner    Balance Overall balance assessment: Needs assistance Sitting-balance support: No upper extremity supported;Feet supported Sitting balance-Leahy Scale: Fair     Standing balance support: Bilateral upper extremity supported;No upper extremity supported Standing balance-Leahy Scale: Fair Standing balance comment: able to maintain static standing without UE support                           ADL either performed or assessed with  clinical judgement   ADL Overall ADL's : Needs assistance/impaired                                     Functional mobility during ADLs: Min guard General ADL Comments: pt declined performing ADL this session but with questions about upcoming CIR admit, answered questions throughout session; discussion held on energy conservation and activity progression after return home     Vision       Perception     Praxis      Cognition Arousal/Alertness: Awake/alert Behavior During Therapy: WFL for tasks assessed/performed Overall Cognitive Status: Within Functional Limits for tasks assessed                                          Exercises Exercises: Other exercises Other Exercises Other Exercises: level 1 theraband exercise; LUE shoulder abduction; bil UE shoulder external rotation    Shoulder Instructions       General Comments Pt on 4L O2 with O2 sats >92% with sit<>stand and seated activity     Pertinent Vitals/ Pain          Home Living                                          Prior Functioning/Environment  Frequency  Min 2X/week        Progress Toward Goals  OT Goals(current goals can now be found in the care plan section)  Progress towards OT goals: Progressing toward goals  Acute Rehab OT Goals Patient Stated Goal: to go to rehab to get stronger OT Goal Formulation: With patient Time For Goal Achievement: 06/11/19 Potential to Achieve Goals: Good  Plan Discharge plan remains appropriate    Co-evaluation                 AM-PAC OT "6 Clicks" Daily Activity     Outcome Measure   Help from another person eating meals?: None Help from another person taking care of personal grooming?: A Little Help from another person toileting, which includes using toliet, bedpan, or urinal?: A Little Help from another person bathing (including washing, rinsing, drying)?: A Little Help from  another person to put on and taking off regular upper body clothing?: A Little Help from another person to put on and taking off regular lower body clothing?: A Lot 6 Click Score: 18    End of Session Equipment Utilized During Treatment: Oxygen  OT Visit Diagnosis: Muscle weakness (generalized) (M62.81)   Activity Tolerance Patient tolerated treatment well   Patient Left in chair;with call bell/phone within reach   Nurse Communication Mobility status        Time: 6387-5643 OT Time Calculation (min): 18 min  Charges: OT General Charges $OT Visit: 1 Visit OT Treatments $Self Care/Home Management : 8-22 mins  Lou Cal, OT Supplemental Rehabilitation Services Pager [phone] Office [phone]   Raymondo Band 05/30/2019, 4:37 PM

## 2019-05-30 NOTE — Progress Notes (Signed)
Patient refuses to wear CPAP due to it being intolerable.  RT set up a venturi mask on 8L 40% for the patient to sleep with.  RN was notified that the VM was setup and would need to apply once the patient is ready to go to sleep.

## 2019-05-30 NOTE — Progress Notes (Signed)
Patient states he has an appointment with [facility] on Thursday for an iron infusion. He is concerned about missing this appointment. Dr. Nevada Crane paged and informed. Awaiting a response.

## 2019-05-30 NOTE — Progress Notes (Signed)
Charlett Blake, MD  Physician  Physical Medicine and Rehabilitation  PMR Pre-admission  Addendum  Date of Service:  05/25/2019 3:03 PM       Related encounter: ED to Hosp-Admission (Current) from 05/19/2019 in [place] [facility]         Show:Clear all _0 Manual_1 Template_2 Copied  Added by: _3 Charlett Blake, MD_4 Michel Santee, PT  _5 Hover for details PMR Admission Coordinator Pre-Admission Assessment  Patient: David Barton is an 85 y.o., male MRN: [id number] DOB: 1933-11-11 Height: _6  (177.8 cm) Weight: 102.2 kg  Insurance Information HMO: yes    PPO:      PCP:      IPA:      80/20:      OTHER:  PRIMARY: U[employer] Medicare      Policy#: [id number]      Subscriber: patient CM Name: Adonis Huguenin      Phone#: [phone]     Fax#: [phone] Pre-Cert#: [id number] [place] provided by Adonis Huguenin at [facility], with updates due to [place] at (p) [phone] or (f) [phone]      Employer:  Benefits:  Phone #: [phone]     Name:  Irene Shipper. Date: 12/20/18     Deduct: $0      Out of Pocket Max: $3600 (met $2214.39)      Life Max: n/a CIR: $295/day for days 1-5      SNF: 20 full days Outpatient: $30/visit     Co-Pay: Home Health: 100%      Co-Pay:  DME: 80%     Co-Pay: 20% Providers:  SECONDARY:       Policy#:       Subscriber:  CM Name:       Phone#:      Fax#:  Pre-Cert#:       Employer:  Benefits:  Phone #:      Name:  Eff. Date:      Deduct:       Out of Pocket Max:       Life Max:  CIR:       SNF:  Outpatient:      Co-Pay:  Home Health:       Co-Pay:  DME:      Co-Pay:   Medicaid Application Date:       Case Manager:  Disability Application Date:       Case Worker:   The "Data Collection Information Summary" for patients in Inpatient Rehabilitation Facilities with attached "Privacy Act [place] Records" was provided and verbally reviewed with: Patient and Family  Emergency Contact Information          Contact Information    Name Relation Home Work Mobile   [place] Spouse [phone]  [phone]      Current Medical History  Patient Admitting Diagnosis: debility, respiratory failure  History of Present Illness: Dex Blakely is an 85 year old male with history of chronic hypoxic respiratory failure- 4L oxygen dependent, COPD, CKD IV, recent admission for septic shock due to CAP, complicated by onset of A fib and failed DCCV.  He was readmitted on 05/19/19 with recurrent acute on chronic respiratory failure due to recurrent PNA with marked worsening ofRLL collapse with right pleural effusionand new small pericardial effusion.Pt with mild volume overload, working to balance diruetics with pt's chronic kidney disease.  Pt refusing dialysis as well as PM BiPAP.  Therapy evaluations recommended CIR.  CKD IV improving  Patient's medical record from [facility] has been  reviewed by the rehabilitation admission coordinator and physician.  Past Medical History  Past Medical History:  Diagnosis Date  . Alcohol abuse   . Colon cancer ([place])   . COPD (chronic obstructive pulmonary disease) ([place])   . Emphysema of lung ([place])   . Former tobacco use   . Hypertension   . Peripheral edema   . Sepsis ([place]) 10/2016   Aspiration PNA/C diff colitis    Family History   family history includes Brain cancer in his father; Colon cancer in his mother; Lung cancer in his brother.  Prior Rehab/Hospitalizations Has the patient had prior rehab or hospitalizations prior to admission? Yes  Has the patient had major surgery during 100 days prior to admission? No              Current Medications  Current Facility-Administered Medications:  .  0.9 %  sodium chloride infusion, 250 mL, Intravenous, PRN, Parrett, Tammy S, NP .  allopurinol (ZYLOPRIM) tablet 100 mg, 100 mg, Oral, Daily, Rizwan, Saima, MD, 100 mg at 05/30/19 1027 .  amiodarone (PACERONE) tablet 200 mg, 200  mg, Oral, Daily, Parrett, Tammy S, NP, 200 mg at 05/30/19 1027 .  apixaban (ELIQUIS) tablet 2.5 mg, 2.5 mg, Oral, BID, Minus Breeding, MD, 2.5 mg at 05/30/19 1027 .  bisacodyl (DULCOLAX) EC tablet 10 mg, 10 mg, Oral, Daily, Vertis Kelch L, RPH, 10 mg at 05/26/19 0930 .  bisoprolol (ZEBETA) tablet 5 mg, 5 mg, Oral, Daily, Parrett, Tammy S, NP, 5 mg at 05/30/19 1027 .  budesonide (PULMICORT) nebulizer solution 0.25 mg, 0.25 mg, Nebulization, BID, Chase Caller, Murali, MD, 0.25 mg at 05/30/19 0819 .  Chlorhexidine Gluconate Cloth 2 % PADS 6 each, 6 each, Topical, Daily, Debbe Odea, MD, 6 each at 05/29/19 1506 .  ferrous sulfate tablet 325 mg, 325 mg, Oral, Daily, Parrett, Tammy S, NP, 325 mg at 05/30/19 1027 .  furosemide (LASIX) tablet 40 mg, 40 mg, Oral, BID, Minus Breeding, MD, 40 mg at 05/30/19 1027 .  ipratropium-albuterol (DUONEB) 0.5-2.5 (3) MG/3ML nebulizer solution 3 mL, 3 mL, Nebulization, Q6H PRN, Brand Males, MD, 3 mL at 05/30/19 0819 .  nitroGLYCERIN (NITROSTAT) SL tablet 0.4 mg, 0.4 mg, Sublingual, Q5 min PRN, Law, Alexandra M, PA-C, 0.4 mg at 05/19/19 0910 .  ondansetron (ZOFRAN) injection 4 mg, 4 mg, Intravenous, Q6H PRN, Parrett, Tammy S, NP .  sodium chloride flush (NS) 0.9 % injection 3 mL, 3 mL, Intravenous, Q12H, Parrett, Tammy S, NP, 3 mL at 05/30/19 1028 .  sodium chloride flush (NS) 0.9 % injection 3 mL, 3 mL, Intravenous, PRN, Parrett, Tammy S, NP  Patients Current Diet:  Diet Order                  Diet regular Room service appropriate? Yes; Fluid consistency: Thin  Diet effective now               Precautions / Restrictions Precautions Precautions: Fall, Other (comment) Precaution Comments: watch O2 Restrictions Weight Bearing Restrictions: No   Has the patient had 2 or more falls or a fall with injury in the past year? Yes  Prior Activity Level Limited Community (1-2x/wk): more limited since his hospital admission in March, not  driving, had just stopped using AD  Prior Functional Level Self Care: Did the patient need help bathing, dressing, using the toilet or eating? Needed some help  Indoor Mobility: Did the patient need assistance with walking from room to room (with or without device)? Needed  some help  Stairs: Did the patient need assistance with internal or external stairs (with or without device)? Needed some help  Functional Cognition: Did the patient need help planning regular tasks such as shopping or remembering to take medications? Needed some help  Home Assistive Devices / [place] Devices/Equipment: None Home Equipment: Walker - 2 wheels, Shower seat  Prior Device Use: Indicate devices/aids used by the patient prior to current illness, exacerbation or injury? Walker  Current Functional Level Cognition  Overall Cognitive Status: Within Functional Limits for tasks assessed Orientation Level: Oriented X4    Extremity Assessment (includes Sensation/Coordination)  Upper Extremity Assessment: Generalized weakness  Lower Extremity Assessment: Generalized weakness    ADLs  Overall ADL's : Needs assistance/impaired Eating/Feeding: Set up, Sitting Grooming: Set up, Sitting Upper Body Bathing: Set up, Sitting Lower Body Bathing: Moderate assistance, Sitting/lateral leans, Sit to/from stand Upper Body Dressing : Set up, Sitting Lower Body Dressing: Moderate assistance, Sitting/lateral leans, Sit to/from stand Toilet Transfer: Min guard, Grab bars, Ambulation, Regular Toilet Toileting- Clothing Manipulation and Hygiene: Minimal assistance, Sit to/from stand Functional mobility during ADLs: Min guard, Rolling walker, Cueing for safety General ADL Comments: Pt set-up for UB ADL and modA for LB ADL    Mobility  Overal bed mobility: Needs Assistance Bed Mobility: Supine to Sit Supine to sit: Min assist General bed mobility comments: sitting in recliner     Transfers   Overall transfer level: Needs assistance Equipment used: Rolling walker (2 wheeled) Transfers: Sit to/from Stand Sit to Stand: Min guard, Supervision General transfer comment: sit to stand from recliner    Ambulation / Gait / Stairs / Emergency planning/management officer  Ambulation/Gait Ambulation/Gait assistance: Counsellor (Feet): 300 Feet Assistive device: Rolling walker (2 wheeled) Gait Pattern/deviations: Step-through pattern, Decreased stride length, Trunk flexed General Gait Details: Assist for balance and support. Pt was 80% on RA at rest.  Pt amb on 3L down to 85% and 87% on 4L, returns to 95% with rest fairly quickly.  Gait velocity: decr Gait velocity interpretation: <1.8 ft/sec, indicate of risk for recurrent falls    Posture / Balance Balance Overall balance assessment: Needs assistance Sitting-balance support: No upper extremity supported, Feet supported Sitting balance-Leahy Scale: Fair Standing balance support: Bilateral upper extremity supported Standing balance-Leahy Scale: Poor Standing balance comment: walker and min guard for static standing    Special needs/care consideration BiPAP/CPAP refuses CPM  Continuous Drip IV  Dialysis refuses        Days  Life Vest  Oxygen 4L via nasal cannula Special Bed  Trach Size no Wound Vac (area) no      Location n/a Skin MASD to groin Bowel mgmt: last BM 6/4, continent Bladder mgmt: condom cath Diabetic mgmt: no Behavioral consideration no Chemo/radiation no   Previous Home Environment (from acute therapy documentation) Living Arrangements: Spouse/significant other Available Help at Discharge: Family, Available 24 hours/day Type of Home: House Home Layout: One level Home Access: Stairs to enter [employer] of Steps: 3-4 Bathroom Shower/Tub: Chiropodist: Standard Bathroom Accessibility: Yes How Accessible: Accessible via walker Home Care Services: Yes Type of Home Care  Services: Other (Comment)(Home Oxygen ) Additional Comments: Home O2  Discharge Living Setting Plans for Discharge Living Setting: Patient's home Type of Home at Discharge: House Discharge Home Layout: One level Discharge Home Access: Stairs to enter Entrance Stairs-Rails: Right, Left Entrance Stairs-Number of Steps: 3 Discharge Bathroom Shower/Tub: Tub/shower unit Discharge Bathroom Toilet: Standard Discharge Bathroom Accessibility: Yes How  Accessible: Accessible via walker Does the patient have any problems obtaining your medications?: No  Social/Family/Support Systems Patient Roles: Spouse Anticipated Caregiver: wife, Archie Patten Anticipated Ambulance person Information: [phone] Ability/Limitations of Caregiver: min assist Caregiver Availability: 24/7 Discharge Plan Discussed with Primary Caregiver: Yes Is Caregiver In Agreement with Plan?: Yes Does Caregiver/Family have Issues with Lodging/Transportation while Pt is in Rehab?: No  Goals/Additional Needs Patient/Family Goal for Rehab: PT/OT supervision to mod I Expected length of stay: 7-10 days Dietary Needs: regular, thin Additional Information: has been unable to tolerate BiPAP or CPAP, will need outpatient sleep study to determine best course of O2 support at night. Pt does tolerate venturimask at night if needed Pt/Family Agrees to Admission and willing to participate: Yes Program Orientation Provided & Reviewed with Pt/Caregiver Including Roles  & Responsibilities: Yes  Barriers to Discharge: Other (comments)(PIV needs)   Possible need for SNF placement upon discharge: no  Patient Condition: I have reviewed medical records from [facility], spoken with CM, and patient and spouse. I met with patient at the bedside and discussed with spouse via phone for inpatient rehabilitation assessment.  Patient will benefit from ongoing PT and OT, can actively participate in 3 hours of therapy a day 5 days of the  week, and can make measurable gains during the admission.  Patient will also benefit from the coordinated team approach during an Inpatient Acute Rehabilitation admission.  The patient will receive intensive therapy as well as Rehabilitation physician, nursing, social worker, and care management interventions.  Due to safety, disease management, medication administration, pain management and patient education the patient requires 24 hour a day rehabilitation nursing.  The patient is currently min assist with mobility and basic ADLs.  Discharge setting and therapy post discharge at home with home health is anticipated.  Patient has agreed to participate in the Acute Inpatient Rehabilitation Program and will admit today.  Preadmission Screen Completed By:  Michel Santee, PT, DPT 05/30/2019 1:17 PM ______________________________________________________________________   Discussed status with Dr. Letta Pate  on 05/30/19  at 1:17 PM  and received approval for admission today.  Admission Coordinator:  Michel Santee, PT, DPT 1:17 PM Sudie Grumbling 05/30/19    Assessment/Plan: Diagnosis:Debility due to pneumonia with acute on chronic respiratory failure 1. Does the need for close, 24 hr/day Medical supervision in concert with the patient's rehab needs make it unreasonable for this patient to be served in a less intensive setting? Yes 2. Co-Morbidities requiring supervision/potential complications: COPD, Atrial fib 3. Due to bladder management, bowel management, safety, skin/wound care, disease management, medication administration, pain management and patient education, does the patient require 24 hr/day rehab nursing? Yes 4. Does the patient require coordinated care of a physician, rehab nurse, PT (1-2 hrs/day, 5 days/week) and OT (1-2 hrs/day, 5 days/week) to address physical and functional deficits in the context of the above medical diagnosis(es)? Yes Addressing deficits in the following areas: balance,  endurance, locomotion, strength, transferring, bowel/bladder control, bathing, dressing, feeding, grooming, toileting and psychosocial support 5. Can the patient actively participate in an intensive therapy program of at least 3 hrs of therapy 5 days a week? Yes 6. The potential for patient to make measurable gains while on inpatient rehab is good 7. Anticipated functional outcomes upon discharge from inpatients are: supervision PT, supervision OT, n/a SLP 8. Estimated rehab length of stay to reach the above functional goals is: 7-10d 9. Anticipated D/C setting: Home 10. Anticipated post D/C treatments: [place] therapy 11. Overall Rehab/Functional Prognosis: fair  MD Signature: Charlett Blake M.D. [facility] FAAPM&R (Sports Med, Neuromuscular Med) Diplomate Am Board of Electrodiagnostic Med     Revision History

## 2019-05-30 NOTE — Progress Notes (Addendum)
NAME:  David Barton, MRN:  [id number], DOB:  1933-12-07, LOS: 11 ADMISSION DATE:  05/19/2019, CONSULTATION DATE:  5/30 REFERRING MD:  Algis Liming, CHIEF COMPLAINT:  Nocturnal desaturations.   Brief History   85 year old man with moderate COPD, obesity and HFrEF,  Followed by Dr Melvyn Novas. Prior admission for pneumonia and shock in March/April. Returned for increasing dyspnea.  Treated with antibiotics again. Parapneumonic effusion tapped.  Continue to have O2 requirements with significant desaturation at nighttime.  Unable to tolerate BiPAP due to tight fitting mask as well as sensation of pressurized air.  Past Medical History   Past Medical History:  Diagnosis Date  . Alcohol abuse   . Colon cancer ([place])   . COPD (chronic obstructive pulmonary disease) ([place])   . Emphysema of lung ([place])   . Former tobacco use   . Hypertension   . Peripheral edema   . Sepsis ([place]) 10/2016   Aspiration PNA/C diff colitis    Significant Diagnostic Tests:  CXR 6/3:  Increased interstitial markings bilaterally. Improving R effusion. Persistent R mid lung infiltrate.    Interim history/subjective:  Patient unaware of desaturation and reports that he sleeps fine.  Objective   Blood pressure 101/60, pulse 89, temperature 97.6 F (36.4 C), temperature source Oral, resp. rate 18, height 5\' 10"  (1.778 m), weight 102.2 kg, SpO2 93 %.        Intake/Output Summary (Last 24 hours) at 05/30/2019 1019 Last data filed at 05/30/2019 0600 Gross per 24 hour  Intake 0 ml  Output 500 ml  Net -500 ml   Filed Weights   05/25/19 0500 05/26/19 0332 05/30/19 0548  Weight: 101.5 kg 101.5 kg 102.2 kg    Examination: General: Obese older adult male, reclined in bed in NAD  HENT: NCAT. Trachea midline. Pink mmm. Patent nares.  Lungs: Symmetrical chest expansion. No accessory muscle recruitment. Diminished bibasilar sounds  Cardiovascular: RRR s1s2. 1+ peripheral pulses  Abdomen: Obese, soft, round, ndnt. +  bowel sounds  Extremities: No cyanosis, no clubbing, no obvious deformity  Neuro: AOx4. Following Commands. PERRL.   Resolved Hospital Problem list   Parapneumonic effusion.  Assessment & Plan:   Acute on chronic respiratory insufficiency, with baseline hypoxia and nocturnal sleep disordered breathing   - Continues to not tolerate CPAP due to claustrophobia from sensation of air/oxygen pressure - On VM overnight without desaturation  P - Patient would most benefit from form of tolerable NIV. Would probably do best with nasal pillows - After conversation with patient, am not very hopeful that he is willing to try further NIV at this time, however do recommend continued encouragement of trying NIV. Patient states he prefers the VM to all attempted NIV forms. Will discuss with PCCM attending.  - if unable to tolerate all NIV would recommend supplemental O2 for goal SpO2 88-92%. This was achieved with VM overnight.  - Will need outpatient follow up - Continue to optimize fluid status as heart failure can worsen sleep disordered breathing  - Continue pulm hygiene    Rest Per Primary   Labs   CBC: Recent Labs  Lab 05/24/19 0454  WBC 6.8  HGB 8.5*  HCT 28.7*  MCV 107.5*  PLT 701    Basic Metabolic Panel: Recent Labs  Lab 05/24/19 0454 05/25/19 0533 05/26/19 0546 05/27/19 0645  NA 144 145 145 144  K 4.9 4.9 4.8 4.3  CL 103 105 104 103  CO2 32 32 32 31  GLUCOSE 95 100* 100*  96  BUN 69* 67* 65* 61*  CREATININE 3.40* 3.25* 3.14* 3.12*  CALCIUM 9.2 9.4 9.5 9.5  MG 2.2  --   --   --   PHOS 2.6  --   --   --    GFR: Estimated Creatinine Clearance: 21.5 mL/min (A) (by C-G formula based on SCr of 3.12 mg/dL (H)). Recent Labs  Lab 05/24/19 0454  WBC 6.8    Liver Function Tests: No results for input(s): AST, ALT, ALKPHOS, BILITOT, PROT, ALBUMIN in the last 168 hours. No results for input(s): LIPASE, AMYLASE in the last 168 hours. No results for input(s): AMMONIA in the  last 168 hours.  ABG    Component Value Date/Time   PHART 7.417 05/23/2019 0341   PCO2ART 49.8 (H) 05/23/2019 0341   PO2ART 56.0 (L) 05/23/2019 0341   HCO3 32.2 (H) 05/23/2019 0341   TCO2 34 (H) 05/23/2019 0341   ACIDBASEDEF 3.0 (H) 03/05/2019 0317   O2SAT 89.0 05/23/2019 0341     Coagulation Profile: No results for input(s): INR, PROTIME in the last 168 hours.  Cardiac Enzymes: No results for input(s): CKTOTAL, CKMB, CKMBINDEX, TROPONINI in the last 168 hours.  HbA1C: Hgb A1c MFr Bld  Date/Time Value Ref Range Status  11/05/2016 03:07 AM 5.2 4.8 - 5.6 % Final    Comment:    (NOTE)         Pre-diabetes: 5.7 - 6.4         Diabetes: >6.4         Glycemic control for adults with diabetes: <7.0     CBG: No results for input(s): GLUCAP in the last 168 hours.   Eliseo Gum MSN, AGACNP-BC [place] [phone] If no answer, [phone] 05/30/2019, 10:19 AM

## 2019-05-30 NOTE — H&P (Signed)
Physical Medicine and Rehabilitation Admission H&P   CC: Debility     HPI: David Barton is an 85 year old male with history of chronic hypoxic respiratory failure- 4L oxygen dependent, COPD, h/o alcohol abuse, CKD IV--has refused dialysis.  recent admission for septic shock with renal failure due to CAP which was complicated by onset of A fib and failed DCCV. He was readmitted on 05/19/19 with recurrent acute on chronic respiratory failure due to recurrent PNA with marked worsening of RLL collapse with right pleural effusion and new small pericardial effusion. He was placed on BIPAP, IV antibiotics and gentle diuresis.  He refused BIPAP required 15 L high flow oxygen.  2 D echo showed EF  45% with hypokinesis of inferolateral, inferoseptal and infero-apical myocardium. Xarelto transitioned to IV heparin. Cardiology consulted for input due to recent decline in EF and felt acute on chronic respiratory failure was pulmonary in nature and  that effusion was parapneumonia. On 6/1, he underwent right thoracocentesis of 1.8 L dark red fluid and has had some bleeding from site. MBS done revealing functional swallow without penetration or aspiration on regular, thins.    BIPAP discontinued as patient has been refusing to wear it.  RLE PNA felt to be due to aspiration with exudative effusion and antibiotics narrowed to Augmentin on 6/4 and completed by 6/7--will need follow up CT chest in 4-5 weeks.  Iron deficiency anemia treated with dose of feraheme on 6/4.  Lasix added for diuresis and adjusted due to worsening of renal status.  He continues to require oxygen during the day and  started desaturating at nights requiring NRBM 6/8. Pulmonary consulted and recommended trial of CPAP due to significant desaturation at night--will require some time of NIV. Nocturnal autotitration with CPAP attempted last night without success due to claustophobia.  He prefers VM --requires  8L oxygen. Dr. Norval Morton recommends that target  saturation around 89-92% and re-evaluation prior to discharge. Patient continues to be deconditioned and CIR recommended due to decline in functional status.       Review of Systems  Constitutional: Negative for chills and fever.  HENT: Negative for hearing loss and tinnitus.   Eyes: Negative for blurred vision and double vision.  Respiratory: Positive for shortness of breath. Negative for cough and wheezing.   Cardiovascular: Negative for chest pain and palpitations.  Gastrointestinal: Negative for heartburn.  Genitourinary: Negative for dysuria and urgency.  Musculoskeletal: Negative for back pain, myalgias and neck pain.  Skin: Negative for rash.  Neurological: Negative for dizziness, weakness and headaches.  Psychiatric/Behavioral: Negative for depression. The patient is not nervous/anxious.           Past Medical History:  Diagnosis Date   Alcohol abuse     Colon cancer ([place])     COPD (chronic obstructive pulmonary disease) ([place])     Emphysema of lung ([place])     Former tobacco use     Hypertension     Peripheral edema     Sepsis ([place]) 10/2016    Aspiration PNA/C diff colitis           Past Surgical History:  Procedure Laterality Date   CARDIOVERSION N/A 03/16/2019    Procedure: CARDIOVERSION;  Surgeon: Pixie Casino, MD;  Location: [facility] ENDOSCOPY;  Service: Cardiovascular;  Laterality: N/A;   CARDIOVERSION N/A 03/23/2019    Procedure: CARDIOVERSION;  Surgeon: Lelon Perla, MD;  Location: [facility] ENDOSCOPY;  Service: Cardiovascular;  Laterality: N/A;   CATARACT EXTRACTION  COLON RESECTION       COLONOSCOPY       IMPLANTATION BONE ANCHORED HEARING AID Left 2016   IR THORACENTESIS ASP PLEURAL SPACE W/IMG GUIDE   05/21/2019   MINOR HEMORRHOIDECTOMY   1995   TEE WITHOUT CARDIOVERSION N/A 03/16/2019    Procedure: TRANSESOPHAGEAL ECHOCARDIOGRAM (TEE);  Surgeon: Pixie Casino, MD;  Location: [facility] ENDOSCOPY;  Service: Cardiovascular;  Laterality: N/A;    TONSILLECTOMY               Family History  Problem Relation Age of Onset   Brain cancer Father     Colon cancer Mother          with mets to liver   Lung cancer Brother          was a smoker      Social History:  Married. Retired-owned a Emergency planning/management officer. He  reports that he quit smoking about 37 years ago. His smoking use included cigarettes. He has a 20.00 pack-year smoking history. He has never used smokeless tobacco. He reports current alcohol use of about 7.0 standard drinks of alcohol per week. He reports that he does not use drugs.          Allergies  Allergen Reactions   Flexeril [Cyclobenzaprine] Other (See Comments)      Unknown reaction             Medications Prior to Admission  Medication Sig Dispense Refill   allopurinol (ZYLOPRIM) 100 MG tablet Take 100 mg by mouth daily.       amiodarone (PACERONE) 200 MG tablet Take 1 tablet (200 mg total) by mouth daily. 90 tablet 0   bisoprolol (ZEBETA) 5 MG tablet Take 5 mg by mouth daily.       diltiazem (CARDIZEM CD) 120 MG 24 hr capsule Take 1 capsule (120 mg total) by mouth at bedtime. 30 capsule 5   ferrous sulfate 325 (65 FE) MG tablet Take 325 mg by mouth daily.        furosemide (LASIX) 40 MG tablet Take 60 mg by mouth daily.        Multiple Vitamin (MULTIVITAMIN WITH MINERALS) TABS tablet Take 1 tablet by mouth daily.       OXYGEN Inhale 4 L into the lungs continuous.       Rivaroxaban (XARELTO) 15 MG TABS tablet Take 1 tablet (15 mg total) by mouth daily with supper. 90 tablet 1   ipratropium-albuterol (DUONEB) 0.5-2.5 (3) MG/3ML SOLN Use twice a day scheduled and every 4 hours as needed for shortness of breath and wheezing (Patient not taking: Reported on 05/19/2019) 360 mL 0      Drug Regimen Review  Drug regimen was reviewed and remains appropriate with no significant issues identified   Home: Home Living Family/patient expects to be discharged to:: Private residence Living Arrangements:  Spouse/significant other Available Help at Discharge: Family, Available 24 hours/day Type of Home: House Home Access: Stairs to enter [employer] of Steps: 3-4 Home Layout: One level Bathroom Shower/Tub: Chiropodist: Standard Bathroom Accessibility: Yes Home Equipment: Environmental consultant - 2 wheels, Shower seat Additional Comments: Home O2   Functional History: Prior Function Level of Independence: Needs assistance Gait / Transfers Assistance Needed: Per pt he was amb without assistive device. ADL's / Homemaking Assistance Needed: Pt was performing ADL with assist from spouse when tired.   Functional Status:  Mobility: Bed Mobility Overal bed mobility: Needs Assistance Bed Mobility: Supine to Sit Supine to  sit: Min assist General bed mobility comments: sitting in recliner  Transfers Overall transfer level: Needs assistance Equipment used: Rolling walker (2 wheeled) Transfers: Sit to/from Stand Sit to Stand: Min guard, Supervision General transfer comment: sit to stand from recliner Ambulation/Gait Ambulation/Gait assistance: Min guard Gait Distance (Feet): 300 Feet Assistive device: Rolling walker (2 wheeled) Gait Pattern/deviations: Step-through pattern, Decreased stride length, Trunk flexed General Gait Details: Assist for balance and support. Pt was 80% on RA at rest.  Pt amb on 3L down to 85% and 87% on 4L, returns to 95% with rest fairly quickly.  Gait velocity: decr Gait velocity interpretation: <1.8 ft/sec, indicate of risk for recurrent falls   ADL: ADL Overall ADL's : Needs assistance/impaired Eating/Feeding: Set up, Sitting Grooming: Set up, Sitting Upper Body Bathing: Set up, Sitting Lower Body Bathing: Moderate assistance, Sitting/lateral leans, Sit to/from stand Upper Body Dressing : Set up, Sitting Lower Body Dressing: Moderate assistance, Sitting/lateral leans, Sit to/from stand Toilet Transfer: Min guard, Grab bars, Ambulation,  Regular Toilet Toileting- Clothing Manipulation and Hygiene: Minimal assistance, Sit to/from stand Functional mobility during ADLs: Min guard, Rolling walker, Cueing for safety General ADL Comments: Pt set-up for UB ADL and modA for LB ADL   Cognition: Cognition Overall Cognitive Status: Within Functional Limits for tasks assessed Orientation Level: Oriented X4 Cognition Arousal/Alertness: Awake/alert Behavior During Therapy: WFL for tasks assessed/performed Overall Cognitive Status: Within Functional Limits for tasks assessed     Blood pressure 101/60, pulse 89, temperature 97.6 F (36.4 C), temperature source Oral, resp. rate 18, height 5\' 10"  (1.778 m), weight 102.2 kg, SpO2 93 %. Physical Exam  Nursing note and vitals reviewed. Constitutional: He is oriented to person, place, and time. He appears well-developed and well-nourished. No distress.  Respiratory: Effort normal. He has wheezes in the right lower field, the left upper field, the left middle field and the left lower field.  Musculoskeletal:        General: Edema present.     Comments: Min edema BLE. Healing excoriations bilateral shins. right shoulder limited due to RTC pathology.    Neurological: He is alert and oriented to person, place, and time.  Dysphonia noted. Able to follow commands without difficulty.   Skin: He is not diaphoretic.   Heart regularly irregular rate and rhythm no rubs murmurs or extra sounds Abdomen positive bowel sounds soft nontender palpation Lungs occasional wheeze no rales or rhonchi no respiratory distress Motor strength is 4/5 bilateral deltoid bicep tricep grip hip flexor knee extensor ankle dorsiflexion plantarflexion Sensation intact light touch bilateral upper and lower limbs.  Extremities no clubbing or cyanosis, extremities are warm Lab Results Last 48 Hours  No results found for this or any previous visit (from the past 48 hour(s)).   Imaging Results (Last 48 hours)  No results  found.       Medical Problem List and Plan: 1.    Debility secondary to pneumonia causing acute exacerbation of chronic respiratory failure 2.  Antithrombotics: -DVT/anticoagulation:  Pharmaceutical: Other (comment)--Elquis             -antiplatelet therapy: N/A 3. Pain Management: N/A 4. Mood: LCSW to follow for evaluation and support.              -antipsychotic agents: N/A 5. Neuropsych: This patient is capable of making decisions on his own behalf. 6. Skin/Wound Care: Routine pressure relief measures.  7. Fluids/Electrolytes/Nutrition: strict I/O. Check lytes in am.  8. COPD with chronic hypoxic failure: Pulmicort bid with  4 liters oxygen during the day and VM- 8L at nights. Wean to least amount needed to keep saturations 89-92%.   Will need PCCM to evaluate oxygen needs/set sleep study prior to discharge. .  9. Aspiration PNA: Has completed a week course of Augmentin.  10: Afib/Aflutter: Now back on Eliquis--for DCCV in the future after pulmonary status improves? 11. Acute on Chronic systolic: On lasix 40 mg bid and Zebeta.  Monitor daily weights as well as strict I/O. Marland Kitchen  12. CKD stage IV: Followed by Dr. Moshe Cipro. Has refused HD .13. Sick Euthyroid?: Will need repeat TSH in 3-4 weeks.      Post Admission Physician Evaluation: 1. Functional deficits secondary  to debility. 2. Patient admitted to receive collaborative, interdisciplinary care between the physiatrist, rehab nursing staff, and therapy team. 3. Patient's level of medical complexity and substantial therapy needs in context of that medical necessity cannot be provided at a lesser intensity of care. 4. Patient has experienced substantial functional loss from his/her baseline.  Judging by the patient's diagnosis, physical exam, and functional history, the patient has potential for functional progress which will result in measurable gains while on inpatient rehab.  These gains will be of substantial and practical use upon  discharge in facilitating mobility and self-care at the household level. 5. Physiatrist will provide 24 hour management of medical needs as well as oversight of the therapy plan/treatment and provide guidance as appropriate regarding the interaction of the two. 6. 24 hour rehab nursing will assist in the management of  bladder management, bowel management, safety, skin/wound care, disease management, medication administration, pain management and patient education  and help integrate therapy concepts, techniques,education, etc. 7. PT will assess and treat for:pre gait, gait training, endurance , safety, equipment, neuromuscular re education  .  Goals are: supervision. 8. OT will assess and treat for   ADLs, Cognitive perceptual skills, Neuromuscular re education, safety, endurance, equipment  .  Goals are: supervision.  9. SLP will assess and treat for  .  Goals are: N/A. 10. Case Management and Social Worker will assess and treat for psychological issues and discharge planning. 11. Team conference will be held weekly to assess progress toward goals and to determine barriers to discharge. 12.  Patient will receive at least 3 hours of therapy per day at least 5 days per week. 13. ELOS and Prognosis: 7 to 10 days good  "I have personally performed a face to face diagnostic evaluation of this patient.  Additionally, I have reviewed and concur with the physician assistant's documentation above."  Charlett Blake M.D. [facility] FAAPM&R (Sports Med, Neuromuscular Med) Diplomate [place] Board of [place], PA-C 05/30/2019

## 2019-05-30 NOTE — Progress Notes (Signed)
Inpatient Rehab Admissions Coordinator:   Pulmonary/Critical Care recommending current O2 management (nasal cannula with venturimask at night, if needed) while pt in CIR with f/u with outpatient sleep study.  I have approval from patient's attending, Dr. Nevada Crane, for patient to admit today.  I will let pt/family, and RN/CM know.    Shann Medal, PT, DPT Admissions Coordinator [phone] 05/30/19  12:13 PM

## 2019-05-30 NOTE — Plan of Care (Signed)
  Problem: Consults Goal: RH GENERAL PATIENT EDUCATION Description See Patient Education module for education specifics. Outcome: Progressing   Problem: RH SKIN INTEGRITY Goal: RH STG ABLE TO PERFORM INCISION/WOUND CARE W/ASSISTANCE Description STG Able To Perform Incision/Wound Care With Assistance. Outcome: Progressing   Problem: RH SAFETY Goal: RH STG DECREASED RISK OF FALL WITH ASSISTANCE Description STG Decreased Risk of Fall With Assistance. Outcome: Progressing   Problem: RH PAIN MANAGEMENT Goal: RH STG PAIN MANAGED AT OR BELOW PT'S PAIN GOAL Outcome: Progressing

## 2019-05-31 ENCOUNTER — Inpatient Hospital Stay (HOSPITAL_COMMUNITY): Payer: Medicare Other

## 2019-05-31 ENCOUNTER — Encounter (HOSPITAL_COMMUNITY): Payer: Medicare Other

## 2019-05-31 ENCOUNTER — Inpatient Hospital Stay (HOSPITAL_COMMUNITY): Payer: Medicare Other | Admitting: Physical Therapy

## 2019-05-31 DIAGNOSIS — J189 Pneumonia, unspecified organism: Secondary | ICD-10-CM

## 2019-05-31 LAB — CBC WITH DIFFERENTIAL/PLATELET
Abs Immature Granulocytes: 0.08 10*3/uL — ABNORMAL HIGH (ref 0.00–0.07)
Basophils Absolute: 0 10*3/uL (ref 0.0–0.1)
Basophils Relative: 0 %
Eosinophils Absolute: 0.2 10*3/uL (ref 0.0–0.5)
Eosinophils Relative: 2 %
HCT: 31.8 % — ABNORMAL LOW (ref 39.0–52.0)
Hemoglobin: 9.3 g/dL — ABNORMAL LOW (ref 13.0–17.0)
Immature Granulocytes: 1 %
Lymphocytes Relative: 10 %
Lymphs Abs: 1.1 10*3/uL (ref 0.7–4.0)
MCH: 32.1 pg (ref 26.0–34.0)
MCHC: 29.2 g/dL — ABNORMAL LOW (ref 30.0–36.0)
MCV: 109.7 fL — ABNORMAL HIGH (ref 80.0–100.0)
Monocytes Absolute: 1.4 10*3/uL — ABNORMAL HIGH (ref 0.1–1.0)
Monocytes Relative: 12 %
Neutro Abs: 8.9 10*3/uL — ABNORMAL HIGH (ref 1.7–7.7)
Neutrophils Relative %: 75 %
Platelets: 262 10*3/uL (ref 150–400)
RBC: 2.9 MIL/uL — ABNORMAL LOW (ref 4.22–5.81)
RDW: 16.5 % — ABNORMAL HIGH (ref 11.5–15.5)
WBC: 11.7 10*3/uL — ABNORMAL HIGH (ref 4.0–10.5)
nRBC: 0 % (ref 0.0–0.2)

## 2019-05-31 LAB — COMPREHENSIVE METABOLIC PANEL
ALT: 12 U/L (ref 0–44)
AST: 14 U/L — ABNORMAL LOW (ref 15–41)
Albumin: 2.7 g/dL — ABNORMAL LOW (ref 3.5–5.0)
Alkaline Phosphatase: 58 U/L (ref 38–126)
Anion gap: 10 (ref 5–15)
BUN: 82 mg/dL — ABNORMAL HIGH (ref 8–23)
CO2: 31 mmol/L (ref 22–32)
Calcium: 8.9 mg/dL (ref 8.9–10.3)
Chloride: 101 mmol/L (ref 98–111)
Creatinine, Ser: 4.5 mg/dL — ABNORMAL HIGH (ref 0.61–1.24)
GFR calc Af Amer: 13 mL/min — ABNORMAL LOW (ref >60)
GFR calc non Af Amer: 11 mL/min — ABNORMAL LOW (ref >60)
Glucose, Bld: 120 mg/dL — ABNORMAL HIGH (ref 70–99)
Potassium: 5.6 mmol/L — ABNORMAL HIGH (ref 3.5–5.1)
Sodium: 142 mmol/L (ref 135–145)
Total Bilirubin: 0.5 mg/dL (ref 0.3–1.2)
Total Protein: 6.3 g/dL — ABNORMAL LOW (ref 6.5–8.1)

## 2019-05-31 MED ORDER — FUROSEMIDE 10 MG/ML IJ SOLN
40.0000 mg | Freq: Two times a day (BID) | INTRAMUSCULAR | Status: DC
  Administered 2019-05-31: 23:00:00 40 mg via INTRAVENOUS
  Filled 2019-05-31: qty 4

## 2019-05-31 MED ORDER — FUROSEMIDE 40 MG PO TABS: 40.0000 mg | ORAL_TABLET | Freq: Two times a day (BID) | ORAL | Status: DC

## 2019-05-31 MED ORDER — IPRATROPIUM-ALBUTEROL 0.5-2.5 (3) MG/3ML IN SOLN
3.0000 mL | Freq: Three times a day (TID) | RESPIRATORY_TRACT | Status: DC
  Administered 2019-05-31 – 2019-06-01 (×2): 3 mL via RESPIRATORY_TRACT
  Filled 2019-05-31 (×3): qty 3

## 2019-05-31 NOTE — Progress Notes (Signed)
Discussed patient's current issues with wife as she has multiple concerns. CXR with recurrent right effusion discussed with Dr. Lynetta Mare who feels this is likely due to CHF and recommends diuresis.

## 2019-05-31 NOTE — Evaluation (Signed)
Occupational Therapy Assessment and Plan  Patient Details  Name: David Barton MRN: [id number] Date of Birth: [birth year]  OT Diagnosis: muscle weakness (generalized) and decreased activity tolerance Rehab Potential: Rehab Potential (ACUTE ONLY): Excellent ELOS: 5-7 days   Today's Date: 05/31/2019 OT Individual Time: 3532-9924 OT Individual Time Calculation (min): 75 min     Problem List:  Patient Active Problem List   Diagnosis Date Noted  . Debility 05/30/2019  . Acute on chronic respiratory failure ([place]) 05/19/2019  . Chronic respiratory failure with hypoxia and hypercapnia ([place]) 05/06/2019  . DOE (dyspnea on exertion) 05/04/2019  . HCAP (healthcare-associated pneumonia) 04/25/2019  . Chronic respiratory failure with hypoxia ([place]) 04/25/2019  . History of anemia due to chronic kidney disease 04/17/2019  . Pressure injury of skin 03/17/2019  . Atrial fibrillation with RVR ([place]) 03/11/2019  . Chronic diastolic heart failure ([place]) 01/17/2019  . Edema of both legs 01/02/2019  . Pulmonary edema   . Hypomagnesemia 11/15/2016  . Sleep apnea   . Sepsis ([place]) 11/10/2016  . Elevated troponin 11/10/2016  . Acute diastolic CHF (congestive heart failure) ([place]) 11/10/2016  . Cardiomyopathy ([place]) 11/10/2016  . PSVT (paroxysmal supraventricular tachycardia) ([place]) 11/10/2016  . Dysphagia   . Renal insufficiency   . Enteritis due to Clostridium difficile   . Aspiration pneumonia ([place]) 11/04/2016  . Diarrhea 11/04/2016  . AKI (acute kidney injury) ([place]) 11/04/2016  . Hyperkalemia 11/04/2016  . Essential hypertension 05/31/2013  . COPD with acute exacerbation ([place]) 05/29/2013  . Hypokalemia 05/08/2013  . Hypotension 05/08/2013  . Dizziness 05/08/2013  . Hypoxia 05/08/2013  . Acute respiratory failure ([place]) 05/08/2013    Past Medical History:  Past Medical History:  Diagnosis Date  . Alcohol abuse   . Colon cancer ([place])   . COPD (chronic obstructive pulmonary disease) ([place])    . Emphysema of lung ([place])   . Former tobacco use   . Hypertension   . Peripheral edema   . Sepsis ([place]) 10/2016   Aspiration PNA/C diff colitis   Past Surgical History:  Past Surgical History:  Procedure Laterality Date  . CARDIOVERSION N/A 03/16/2019   Procedure: CARDIOVERSION;  Surgeon: Pixie Casino, MD;  Location: [facility] ENDOSCOPY;  Service: Cardiovascular;  Laterality: N/A;  . CARDIOVERSION N/A 03/23/2019   Procedure: CARDIOVERSION;  Surgeon: Lelon Perla, MD;  Location: [facility] ENDOSCOPY;  Service: Cardiovascular;  Laterality: N/A;  . CATARACT EXTRACTION    . COLON RESECTION    . COLONOSCOPY    . IMPLANTATION BONE ANCHORED HEARING AID Left 2016  . IR THORACENTESIS ASP PLEURAL SPACE W/IMG GUIDE  05/21/2019  . MINOR HEMORRHOIDECTOMY  1995  . TEE WITHOUT CARDIOVERSION N/A 03/16/2019   Procedure: TRANSESOPHAGEAL ECHOCARDIOGRAM (TEE);  Surgeon: Pixie Casino, MD;  Location: [facility] ENDOSCOPY;  Service: Cardiovascular;  Laterality: N/A;  . TONSILLECTOMY      Assessment & Plan Clinical Impression: Patient is a 85 y.o. year old male with recent re-admission to the hospital on 05/19/19 with recurrent acute on chronic respiratory failure due to recurrent PNA withmarked worsening ofRLL collapse with right pleural effusionand new small pericardial effusion.He was placed on BIPAP, IV antibiotics and gentle diuresis. .  Patient transferred to CIR on 05/30/2019 .    Patient currently requires supervision with basic self-care skills secondary to muscle weakness and decreased cardiorespiratoy endurance.  Prior to hospitalization, patient could complete ADL and IADL with independent .  Patient will benefit from skilled intervention to increase independence with basic self-care skills prior to  discharge home with care partner.  Anticipate patient will require no supervision and no further OT follow recommended.  OT - End of Session Activity Tolerance: Decreased this session;Tolerates < 10 min  activity with changes in vital signs Endurance Deficit: Yes Endurance Deficit Description: Pt become SOB frequently during shower and when dressing and required frequent rest breaks and education on pursed lip breathing. OT Assessment Rehab Potential (ACUTE ONLY): Excellent OT Patient demonstrates impairments in the following area(s): Endurance OT Basic ADL's Functional Problem(s): Grooming;Bathing;Dressing;Toileting OT Transfers Functional Problem(s): Toilet;Other (comment)(walk in shower) OT Plan OT Intensity: Minimum of 1-2 x/day, 45 to 90 minutes OT Frequency: 5 out of 7 days OT Duration/Estimated Length of Stay: 5-7 days OT Treatment/Interventions: Balance/vestibular training;DME/adaptive equipment instruction;Patient/family education;Therapeutic Activities;Therapeutic Exercise;Community reintegration;Self Care/advanced ADL retraining;UE/LE Strength taining/ROM;Discharge planning;Neuromuscular re-education;Pain management OT Self Feeding Anticipated Outcome(s): Independent OT Basic Self-Care Anticipated Outcome(s): Independent OT Toileting Anticipated Outcome(s): Independent OT Bathroom Transfers Anticipated Outcome(s): Modified Independent OT Recommendation Patient destination: Home Follow Up Recommendations: None Equipment Recommended: None recommended by OT   Skilled Therapeutic Intervention Patient agreeable to take shower this AM. Physically, patient is functioning well at supervision level for transfer, toileting, grooming, bathing and dressing. Pt presents with decreased activity tolerance, UB strength and endurance resulting in the need to take several rest breaks during simple ADL tasks and requires increased support to complete those tasks. Patient was educated on pursed lip breathing during ADL due to SOB. Patient reports that he was placed on home oxygen a month ago and uses 4L. During session, patient was able to ambulate throughout room without any device. Pt was left in  recliner with chair alarm on and call light with in reach.    OT Evaluation Precautions/Restrictions  Precautions Precautions: Fall Restrictions Weight Bearing Restrictions: No General   Vital Signs Therapy Vitals Pulse Rate: (!) 114 Resp: 16 Patient Position (if appropriate): Lying Oxygen Therapy SpO2: 95 % O2 Device: Nasal Cannula O2 Flow Rate (L/min): 4 L/min Pain Pain Assessment Pain Scale: 0-10 Pain Score: 0-No pain Home Living/Prior Functioning Home Living Family/patient expects to be discharged to:: Private residence Living Arrangements: Spouse/significant other, Children Available Help at Discharge: Family, Available 24 hours/day Type of Home: House Home Access: Stairs to enter Entrance Stairs-Number of Steps: 4 Entrance Stairs-Rails: Right Home Layout: Two level, Able to live on main level with bedroom/bathroom Bathroom Shower/Tub: Walk-in shower(Currently renovating bathroom right now. Will be finished when he goes home) Bathroom Toilet: Standard Bathroom Accessibility: Yes Additional Comments: Patient has received home health PT and OT. 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 Prior Function Level of Independence: Independent with basic ADLs, Independent with homemaking with ambulation  Able to Take Stairs?: Yes ADL ADL Eating: Independent Grooming: Setup Where Assessed-Grooming: Standing at sink Upper Body Bathing: Setup Where Assessed-Upper Body Bathing: Shower Lower Body Bathing:  Setup Where Assessed-Lower Body Bathing: Shower Upper Body Dressing: Setup Where Assessed-Upper Body Dressing: Chair Lower Body Dressing: Setup Where Assessed-Lower Body Dressing: Chair Toileting: Supervision/safety Where Assessed-Toileting: Toilet Toilet Transfer: Close supervision Toilet Transfer Method: Ambulating Toilet Transfer Equipment: Grab bars Walk-In Shower Transfer: Close supervision Walk-In Shower Transfer Method: Ambulating Walk-In Shower Equipment: Grab   bars, Other (comment)(built in fold down bench) Vision Baseline Vision/History: Wears glasses Wears Glasses: Reading only Patient Visual Report: No change from baseline Perception  Perception: Within Functional Limits Praxis Praxis: Intact Cognition Orientation Level: Person;Place;Situation Person: Oriented Place: Oriented Situation: Oriented Year: 2020 Month: June Day of Week: Correct Immediate Memory Recall: Sock;Blue;Bed Memory Recall: Sock;Blue;Bed Memory Recall Sock: Without Cue Memory Recall Blue: Without Cue Memory Recall Bed: Without Cue Sensation Sensation Light Touch: Appears Intact Motor  Motor Motor: Within Functional Limits Mobility  Transfers Sit to Stand: Supervision/Verbal cueing Stand to Sit: Supervision/Verbal cueing  Extremity/Trunk Assessment RUE Assessment RUE Assessment: Within Functional Limits General Strength Comments: Patient has chronic right shoulder issues with limited A/ROM and strength at baseline. Injuried approximately 10 years ago. LUE Assessment Passive Range of Motion (PROM) Comments: WFL Active Range of Motion (AROM) Comments: WFL General Strength Comments: MMT: Shoulder ranges: 4/5, elbow ranges: 4+/5, functional gross grasp     Refer to Care Plan for Long Term Goals  Recommendations for other services: None    Discharge Criteria: Patient will be discharged from OT if patient refuses treatment 3 consecutive times without medical reason, if treatment goals  not met, if there is a change in medical status, if patient makes no progress towards goals or if patient is discharged from hospital.  The above assessment, treatment plan, treatment alternatives and goals were discussed and mutually agreed upon: by patient   Laura Essenmacher, OTR/L,CBIS  [phone]  05/31/2019, 10:49 AM  

## 2019-05-31 NOTE — Progress Notes (Addendum)
1152: Patient transported via wheelchair by Transportation Personnel to Radiology for CXR.   1214: Patient returned to unit via wheelchair transported by Conservation officer, historic buildings.

## 2019-05-31 NOTE — Progress Notes (Signed)
Discussed patient with Dr. Augustin Coupe who reviewed chart. He recommended follow up CXR to rule out fluid overload and monitor for LE edema. To hold lasix for a couple of days with strict monitoring of I/O. CXR ordered--await results. . Will hold lasix this evening and tomorrow--resume on Saturday.

## 2019-05-31 NOTE — Evaluation (Signed)
Physical Therapy Assessment and Plan  Patient Details  Name: David Barton MRN: [id number] Date of Birth: November 30, 1933  PT Diagnosis: Abnormality of gait, Difficulty walking and Muscle weakness Rehab Potential: Good ELOS: 7-9 days    Today's Date: 05/31/2019 PT Individual Time: 8127-5170 PT Individual Time Calculation (min): 76 min    Problem List:  Patient Active Problem List   Diagnosis Date Noted  . Debility 05/30/2019  . Acute on chronic respiratory failure ([place]) 05/19/2019  . Chronic respiratory failure with hypoxia and hypercapnia ([place]) 05/06/2019  . DOE (dyspnea on exertion) 05/04/2019  . HCAP (healthcare-associated pneumonia) 04/25/2019  . Chronic respiratory failure with hypoxia ([place]) 04/25/2019  . History of anemia due to chronic kidney disease 04/17/2019  . Pressure injury of skin 03/17/2019  . Atrial fibrillation with RVR ([place]) 03/11/2019  . Chronic diastolic heart failure ([place]) 01/17/2019  . Edema of both legs 01/02/2019  . Pulmonary edema   . Hypomagnesemia 11/15/2016  . Sleep apnea   . Sepsis ([place]) 11/10/2016  . Elevated troponin 11/10/2016  . Acute diastolic CHF (congestive heart failure) ([place]) 11/10/2016  . Cardiomyopathy ([place]) 11/10/2016  . PSVT (paroxysmal supraventricular tachycardia) ([place]) 11/10/2016  . Dysphagia   . Renal insufficiency   . Enteritis due to Clostridium difficile   . Aspiration pneumonia ([place]) 11/04/2016  . Diarrhea 11/04/2016  . AKI (acute kidney injury) ([place]) 11/04/2016  . Hyperkalemia 11/04/2016  . Essential hypertension 05/31/2013  . COPD with acute exacerbation ([place]) 05/29/2013  . Hypokalemia 05/08/2013  . Hypotension 05/08/2013  . Dizziness 05/08/2013  . Hypoxia 05/08/2013  . Acute respiratory failure ([place]) 05/08/2013    Past Medical History:  Past Medical History:  Diagnosis Date  . Alcohol abuse   . Colon cancer ([place])   . COPD (chronic obstructive pulmonary disease) ([place])   . Emphysema of lung ([place])   . Former  tobacco use   . Hypertension   . Peripheral edema   . Sepsis ([place]) 10/2016   Aspiration PNA/C diff colitis   Past Surgical History:  Past Surgical History:  Procedure Laterality Date  . CARDIOVERSION N/A 03/16/2019   Procedure: CARDIOVERSION;  Surgeon: Pixie Casino, MD;  Location: [facility] ENDOSCOPY;  Service: Cardiovascular;  Laterality: N/A;  . CARDIOVERSION N/A 03/23/2019   Procedure: CARDIOVERSION;  Surgeon: Lelon Perla, MD;  Location: [facility] ENDOSCOPY;  Service: Cardiovascular;  Laterality: N/A;  . CATARACT EXTRACTION    . COLON RESECTION    . COLONOSCOPY    . IMPLANTATION BONE ANCHORED HEARING AID Left 2016  . IR THORACENTESIS ASP PLEURAL SPACE W/IMG GUIDE  05/21/2019  . MINOR HEMORRHOIDECTOMY  1995  . TEE WITHOUT CARDIOVERSION N/A 03/16/2019   Procedure: TRANSESOPHAGEAL ECHOCARDIOGRAM (TEE);  Surgeon: Pixie Casino, MD;  Location: [facility] ENDOSCOPY;  Service: Cardiovascular;  Laterality: N/A;  . TONSILLECTOMY      Assessment & Plan Clinical Impression: Patient is a 85 y.o. year old male with history of chronic hypoxic respiratory failure- 4L oxygen dependent, COPD,h/o alcohol abuse,CKD IV--has refused dialysis.recent admission for septic shock with renal failuredue to CAP which was complicated by onset of A fib and failed DCCV. He wasreadmitted on 05/19/19 with recurrent acute on chronic respiratory failure due to recurrent PNA withmarked worsening ofRLL collapse with right pleural effusionand new small pericardial effusion.He was placed on BIPAP, IV antibiotics and gentle diuresis. He refused BIPAP required 15 L high flow oxygen. 2 D echo showed EF 45% with hypokinesis of inferolateral, inferoseptal and infero-apical myocardium. Xarelto transitioned to IV heparin.  Cardiology consulted for input due to recent decline in EF and felt acute on chronic respiratory failure was pulmonary in nature and that effusion was parapneumonia. On 6/1, he underwent right thoracocentesis of 1.8 L  dark red fluid and has had some bleeding from site. MBS done revealing functional swallow without penetration or aspiration on regular, thins.   BIPAP discontinued as patient has been refusing to wear it. RLE PNA felt to be due to aspiration with exudative effusion and antibiotics narrowed to Augmentin on 6/4 and completed by 6/7--will need follow up CT chest in 4-5 weeks. Iron deficiency anemia treated with dose of feraheme on 6/4. Lasix added for diuresisand adjusted due toworsening of renal status. He continues to require oxygenduring the day and started desaturating at nights requiring NRBM 6/8. Pulmonary consulted and recommended trial of CPAP due to significant desaturation at night--will require some time of NIV. Nocturnal autotitration with CPAP attempted last night without success due to claustophobia. He prefers VM --requires 8L oxygen. Dr. Norval Morton recommends thattarget saturation around 89-92%and re-evaluation prior to discharge. Patient continues to be deconditioned and CIR recommended due to decline in functional status.  Patient transferred to CIR on 05/30/2019 .   Patient currently requires min with mobility secondary to muscle weakness, decreased cardiorespiratoy endurance and decreased oxygen support, decreased motor planning and decreased sitting balance, decreased standing balance, decreased postural control and decreased balance strategies.  Prior to hospitalization, patient was independent  with mobility and lived with Spouse in a House home.  Home access is 4Stairs to enter.  Patient will benefit from skilled PT intervention to maximize safe functional mobility, minimize fall risk and decrease caregiver burden for planned discharge home with intermittent assist.  Anticipate patient will benefit from follow up [facility] at discharge.  PT - End of Session Activity Tolerance: Tolerates < 10 min activity, no significant change in vital signs Endurance Deficit: Yes Endurance  Deficit Description: Becomes SOB frequently during activity requiring seated rest break PT Assessment Rehab Potential (ACUTE/IP ONLY): Good PT Barriers to Discharge: [employer] home environment;Decreased caregiver support;Home environment access/layout;Lack of/limited family support;Other (comments) PT Barriers to Discharge Comments: poor cardiovascular endurance PT Patient demonstrates impairments in the following area(s): Balance;Endurance;Motor;Safety;Edema PT Transfers Functional Problem(s): Bed Mobility;Bed to Chair;Car;Furniture PT Locomotion Functional Problem(s): Ambulation;Stairs PT Plan PT Intensity: Minimum of 1-2 x/day ,45 to 90 minutes PT Frequency: 5 out of 7 days PT Duration Estimated Length of Stay: 7-9 days PT Treatment/Interventions: Ambulation/gait training;Community reintegration;DME/adaptive equipment instruction;Neuromuscular re-education;Psychosocial support;Stair training;UE/LE Strength taining/ROM;Balance/vestibular training;Discharge planning;Functional electrical stimulation;Pain management;Therapeutic Activities;Skin care/wound management;UE/LE Coordination activities;Cognitive remediation/compensation;Disease management/prevention;Functional mobility training;Patient/family education;Splinting/orthotics;Therapeutic Exercise;Visual/perceptual remediation/compensation PT Transfers Anticipated Outcome(s): mod-I PT Locomotion Anticipated Outcome(s): mod-I PT Recommendation Recommendations for Other Services: Therapeutic Recreation consult Therapeutic Recreation Interventions: Pet therapy;Kitchen group;Stress management Follow Up Recommendations: Home health PT Patient destination: Home Equipment Recommended: To be determined;Other (comment) Equipment Details: pt has RW  Skilled Therapeutic Intervention Evaluation completed (see details above and below) with education on PT POC and goals and individual treatment initiated with focus on transfers, gait training, stair  navigation, cardiovascular endurance training, as well as patient education regarding daily therapy schedule, weekly team meetings, purpose of PT evaluation, and other CIR information. Pt received sitting in recliner with nursing staff present to assess vitals and pt agreeable to therapy session. Pt received and maintain on 4L of O2 via nasal cannula throughout session with SpO2 monitored during activity and maintained >88%. Performed sit<>stand recliner<>no AD with CGA for steadying. Ambulated ~65f, no AD, with CGA for  steadying throughout and towards end of ambulation demonstrated increased fatigue with increased postural sway and path deviation. Performed x3 sit<>stand, no UE support, with min assist upon coming to standing due to posterior lean - pt reporting increased B LE muscle fatigue. Extensive pt education on importance of creating a walking program at home with proper set-up with chairs for increased safety as well as development of HEP to increase B LE strength and increase cardiovascular endurance. Transported to/from stairwell in w/c. Ascended/descended 4 steps using B UE support on R HR per home set-up with step-to pattern and mod assist for balance/lifting on ascent - cuing throughout for sequencing. Ambulated ~52f using RW with CGA for safety. Performed 3 bouts of B LE reciprocal movements on Nustep focusing on increased activity tolerance against level 5 resistance and cuing throughout to maintain steps per minute >40: 2.245mutes 76 steps, 17m38mte 48 steps, 1.20 minutes 54 steps. Stand pivot Nustep>w/c, no AD, with close supervision for safety. Transported back to room in w/c. Stand pivot w/c>recliner, no AD, with close supervision for safety. Pt left sitting in recliner with needs in reach and chair alarm on.   PT Evaluation Precautions/Restrictions Precautions Precautions: Fall;Other (comment) Precaution Comments: monitor SpO2 (4L-oxygen dependent baseline) Restrictions Weight Bearing  Restrictions: No Vital Signs Therapy Vitals Temp: 98 F (36.7 C) Temp Source: Oral Pulse Rate: 82 Resp: 18 BP: 92/65 Patient Position (if appropriate): Sitting Oxygen Therapy SpO2: (!) 89 % O2 Device: Nasal Cannula Pain Pain Assessment Pain Scale: 0-10 Pain Score: 0-No pain Pain Intervention(s): Other (Comment)(therapy to tolerance) Home Living/Prior Functioning Home Living Available Help at Discharge: Family;Available PRN/intermittently(wife works full time) Type of Home: House Home Access: Stairs to enter EntTechnical brewer Steps: 4 Entrance Stairs-Rails: Right Home Layout: One level  Lives With: Spouse Prior Function Level of Independence: Independent with gait;Independent with transfers;Independent with homemaking with ambulation  Able to Take Stairs?: Yes Driving: Yes 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 other varNorth Brooksvilleobbies-yes (Comment) Comments: playing golf up until a few years ago due to R shoulder injury but now enjoys reading Vision/Perception  Perception Perception: Within Functional Limits Praxis Praxis: Intact  Cognition Overall Cognitive Status: Within Functional Limits for tasks assessed Orientation Level: Oriented X4 Attention: Focused;Sustained Focused Attention: Appears intact Sustained Attention: Appears intact Awareness: Appears intact Safety/Judgment: Appears intact Sensation Sensation Light Touch: Appears Intact Hot/Cold: Not tested Proprioception: Appears Intact Stereognosis: Not tested Coordination Gross Motor Movements are Fluid and Coordinated: No Coordination and Movement Description: With increased fatigue demonstrates poor B LE motor coordination Heel Shin Test: symmetrical Motor  Motor Motor: Other (comment) Motor - Skilled Clinical Observations: generalized weakness and poor cardiovascular  endurance  Mobility Transfers Transfers: Sit to Stand;Stand to Sit Sit to Stand: Contact Guard/Touching assist Stand to Sit: Contact Guard/Touching assist Stand Pivot Transfers: Contact Guard/Touching assist;Supervision/Verbal cueing Stand Pivot Transfer Details: Tactile cues for sequencing;Verbal cues for sequencing;Verbal cues for precautions/safety;Visual cues/gestures for sequencing Transfer (Assistive device): None Locomotion  Gait Ambulation: Yes Gait Assistance: Contact Guard/Touching assist Gait Distance (Feet): 90 Feet Assistive device: None Gait Gait: Yes Gait Pattern: Impaired Gait Pattern: Decreased step length - left;Decreased step length - right;Wide base of support Gait velocity: decreased Stairs / Additional Locomotion Stairs: Yes Stairs Assistance: Moderate Assistance - Patient 50 - 74% Stair Management Technique: One rail Right(B UE support) Number of Stairs: 6 Height of Stairs: 7 Wheelchair Mobility Wheelchair Mobility: No  Trunk/Postural Assessment  Cervical  Assessment Cervical Assessment: Within Functional Limits Thoracic Assessment Thoracic Assessment: Within Functional Limits Lumbar Assessment Lumbar Assessment: Within Functional Limits Postural Control Postural Control: Deficits on evaluation Righting Reactions: impaired Protective Responses: impaired Postural Limitations: decreased  Balance Balance Balance Assessed: Yes Static Sitting Balance Static Sitting - Balance Support: Feet supported Static Sitting - Level of Assistance: 5: Stand by assistance Dynamic Sitting Balance Dynamic Sitting - Balance Support: Feet supported Dynamic Sitting - Level of Assistance: 5: Stand by assistance Static Standing Balance Static Standing - Balance Support: During functional activity;Bilateral upper extremity supported Static Standing - Level of Assistance: 4: Min assist;5: Stand by assistance Dynamic Standing Balance Dynamic Standing - Balance Support:  During functional activity;Bilateral upper extremity supported Dynamic Standing - Level of Assistance: 4: Min assist;5: Stand by assistance Extremity Assessment      RLE Assessment RLE Assessment: Exceptions to [facility] RLE Strength Right Hip Flexion: 4-/5 Right Knee Flexion: 4/5 Right Knee Extension: 4+/5 Right Ankle Dorsiflexion: 4+/5 Right Ankle Plantar Flexion: 4+/5 LLE Assessment LLE Assessment: Exceptions to [facility] LLE Strength Left Hip Flexion: 3+/5 Left Knee Flexion: 4-/5 Left Knee Extension: 4/5 Left Ankle Dorsiflexion: 4/5 Left Ankle Plantar Flexion: 4/5    Refer to Care Plan for Long Term Goals  Recommendations for other services: Therapeutic Recreation  Pet therapy, Kitchen group and Stress management  Discharge Criteria: Patient will be discharged from PT if patient refuses treatment 3 consecutive times without medical reason, if treatment goals not met, if there is a change in medical status, if patient makes no progress towards goals or if patient is discharged from hospital.  The above assessment, treatment plan, treatment alternatives and goals were discussed and mutually agreed upon: by patient  Tawana Scale, PT, DPT 05/31/2019, 3:48 PM

## 2019-05-31 NOTE — Plan of Care (Signed)
  Problem: Consults Goal: RH GENERAL PATIENT EDUCATION Description: See Patient Education module for education specifics. Outcome: Progressing   Problem: RH SKIN INTEGRITY Goal: RH STG MAINTAIN SKIN INTEGRITY WITH ASSISTANCE Description: STG Maintain Skin Integrity With Assistance. Outcome: Progressing   Problem: RH SAFETY Goal: RH STG ADHERE TO SAFETY PRECAUTIONS W/ASSISTANCE/DEVICE Description: STG Adhere to Safety Precautions With Assistance/Device. Outcome: Progressing Goal: RH STG DECREASED RISK OF FALL WITH ASSISTANCE Description: STG Decreased Risk of Fall With Assistance. Outcome: Progressing   Problem: RH PAIN MANAGEMENT Goal: RH STG PAIN MANAGED AT OR BELOW PT'S PAIN GOAL Outcome: Progressing

## 2019-05-31 NOTE — Discharge Instructions (Signed)

## 2019-05-31 NOTE — Progress Notes (Signed)
Occupational Therapy Note  Patient Details  Name: David Barton MRN: [id number] Date of Birth: 06-25-1933  Today's Date: 05/31/2019 OT Missed Time: 71 Minutes Missed Time Reason: X-Ray  Patient missed 76' of OT scheduled therapy as he was with Radiology for a CXR. Will make up time when able.   Ailene Ravel, OTR/L,CBIS  [phone]  05/31/2019, 12:50 PM

## 2019-05-31 NOTE — Progress Notes (Signed)
Patient information reviewed and entered into [facility] System by Ileana Ladd, [place] coordinator. Information including medical coding, function ability, and quality indicators will be reviewed and updated through discharge.  During the Covid 19 public health emergency, the inpatient rehabilitation unit of the [facility] will use acute care beds on another unit to provide each patient with a private room. This effort is to assure proper infection control and to optimize care management during this public health emergency.

## 2019-05-31 NOTE — Progress Notes (Signed)
PHYSICAL MEDICINE & REHABILITATION PROGRESS NOTE   Subjective/Complaints:  No new problems overnite, slept ok except mask was uncomfortable.  Using face mask not CPAP     Objective:   No results found. Recent Labs    05/31/19 0346  WBC 11.7*  HGB 9.3*  HCT 31.8*  PLT 262   Recent Labs    05/31/19 0346  NA 142  K 5.6*  CL 101  CO2 31  GLUCOSE 120*  BUN 82*  CREATININE 4.50*  CALCIUM 8.9    Intake/Output Summary (Last 24 hours) at 05/31/2019 0615 Last data filed at 05/31/2019 0400 Gross per 24 hour  Intake 100 ml  Output 300 ml  Net -200 ml     Physical Exam: Vital Signs Blood pressure 121/70, pulse 88, temperature (!) 97.5 F (36.4 C), resp. rate 20, height 5\' 10"  (1.778 m), weight 100.7 kg, SpO2 93 %.   General: No acute distress Mood and affect are appropriate Heart: Regular rate and rhythm no rubs murmurs or extra sounds Lungs: Clear to auscultation, breathing unlabored, no rales , +scattered wheezes Abdomen: Positive bowel sounds, soft nontender to palpation, nondistended Extremities: No clubbing, cyanosis, or edema Skin: No evidence of breakdown, no evidence of rash Neurologic: Cranial nerves II through XII intact, motor strength is 4/5 in bilateral deltoid, bicep, tricep, grip, hip flexor, knee extensors, ankle dorsiflexor and plantar flexor Sensory exam normal sensation to light touch and proprioception in bilateral upper and lower extremities Cerebellar exam normal finger to nose to finger as well as heel to shin in bilateral upper and lower extremities Musculoskeletal: Full range of motion in all 4 extremities. No joint swelling   Assessment/Plan: 1. Functional deficits secondary to debility which require 3+ hours per day of interdisciplinary therapy in a comprehensive inpatient rehab setting.  Physiatrist is providing close team supervision and 24 hour management of active medical problems listed below.  Physiatrist and rehab team  continue to assess barriers to discharge/monitor patient progress toward functional and medical goals  Care Tool:  Bathing              Bathing assist       Upper Body Dressing/Undressing Upper body dressing        Upper body assist      Lower Body Dressing/Undressing Lower body dressing            Lower body assist       Toileting Toileting    Toileting assist Assist for toileting: Supervision/Verbal cueing     Transfers Chair/bed transfer  Transfers assist           Locomotion Ambulation   Ambulation assist              Walk 10 feet activity   Assist           Walk 50 feet activity   Assist           Walk 150 feet activity   Assist           Walk 10 feet on uneven surface  activity   Assist           Wheelchair     Assist               Wheelchair 50 feet with 2 turns activity    Assist            Wheelchair 150 feet activity     Assist  Medical Problem List and Plan: 1.   Debility secondary to pneumonia causing acute exacerbation of chronic respiratory failure CIR Evals 2. Antithrombotics: -DVT/anticoagulation:Pharmaceutical:Other (comment)--Elquis -antiplatelet therapy: N/A 3. Pain Management:N/A 4. Mood:LCSW to follow for evaluation and support. -antipsychotic agents: N/A 5. Neuropsych: This patientiscapable of making decisions on hisown behalf. 6. Skin/Wound Care:Routine pressure relief measures. 7. Fluids/Electrolytes/Nutrition:strict I/O. Check lytes in am. 8. COPD with chronic hypoxic failure:Pulmicort bid with 4liters oxygenduring the day and VM- 8L at nights. Wean to least amount needed to keep saturations 89-92%. Will need PCCM to evaluate oxygen needs/set sleep study prior to discharge. . Will order prn albuterol as well 9. Aspiration PNA:Has completed a week course ofAugmentin.Afebrile without cough  10:  Afib/Aflutter:Now back on Eliquis--for DCCVin the futureafter pulmonary status improves? 11. Acute on Chronic systolic:On lasix 40 mg bid and Zebeta.Monitor daily weights as well as strict I/O.Marland Kitchen  12. CKD stage BH:ALPFXTKW by Dr. Moshe Cipro.Has refused HD creat up to 4.5, ask nephro to follow  .13. Sick Euthyroid?: Will need repeat TSH in 3-4 weeks    LOS: 1 days A FACE TO FACE EVALUATION WAS PERFORMED  Charlett Blake 05/31/2019, 6:15 AM

## 2019-06-01 ENCOUNTER — Inpatient Hospital Stay (HOSPITAL_COMMUNITY): Payer: Medicare Other | Admitting: Physical Therapy

## 2019-06-01 ENCOUNTER — Inpatient Hospital Stay (HOSPITAL_COMMUNITY): Payer: Medicare Other | Admitting: Occupational Therapy

## 2019-06-01 ENCOUNTER — Inpatient Hospital Stay (HOSPITAL_COMMUNITY): Payer: Medicare Other

## 2019-06-01 LAB — SARS CORONAVIRUS 2 BY RT PCR (HOSPITAL ORDER, PERFORMED IN ~~LOC~~ HOSPITAL LAB): SARS Coronavirus 2: NEGATIVE

## 2019-06-01 LAB — BASIC METABOLIC PANEL
Anion gap: 12 (ref 5–15)
BUN: 93 mg/dL — ABNORMAL HIGH (ref 8–23)
CO2: 26 mmol/L (ref 22–32)
Calcium: 8.9 mg/dL (ref 8.9–10.3)
Chloride: 102 mmol/L (ref 98–111)
Creatinine, Ser: 4.56 mg/dL — ABNORMAL HIGH (ref 0.61–1.24)
GFR calc Af Amer: 13 mL/min — ABNORMAL LOW (ref >60)
GFR calc non Af Amer: 11 mL/min — ABNORMAL LOW (ref >60)
Glucose, Bld: 120 mg/dL — ABNORMAL HIGH (ref 70–99)
Potassium: 5.1 mmol/L (ref 3.5–5.1)
Sodium: 140 mmol/L (ref 135–145)

## 2019-06-01 MED ORDER — ALBUTEROL SULFATE (2.5 MG/3ML) 0.083% IN NEBU: 2.5000 mg | INHALATION_SOLUTION | RESPIRATORY_TRACT | Status: DC | PRN

## 2019-06-01 MED ORDER — FUROSEMIDE 10 MG/ML IJ SOLN: 40.0000 mg | Freq: Two times a day (BID) | INTRAMUSCULAR | Status: DC

## 2019-06-01 MED ORDER — FUROSEMIDE 10 MG/ML IJ SOLN
40.0000 mg | Freq: Two times a day (BID) | INTRAMUSCULAR | Status: DC
  Administered 2019-06-01: 40 mg via INTRAVENOUS
  Filled 2019-06-01: qty 4

## 2019-06-01 MED ORDER — FUROSEMIDE 10 MG/ML IJ SOLN
80.0000 mg | Freq: Two times a day (BID) | INTRAMUSCULAR | Status: AC
  Administered 2019-06-01 – 2019-06-02 (×2): 80 mg via INTRAVENOUS
  Filled 2019-06-01 (×2): qty 8

## 2019-06-01 MED ORDER — IPRATROPIUM-ALBUTEROL 0.5-2.5 (3) MG/3ML IN SOLN
3.0000 mL | Freq: Two times a day (BID) | RESPIRATORY_TRACT | Status: DC
  Administered 2019-06-01 – 2019-06-07 (×12): 3 mL via RESPIRATORY_TRACT
  Filled 2019-06-01 (×12): qty 3

## 2019-06-01 NOTE — Progress Notes (Signed)
Occupational Therapy Note  Patient Details  Name: David Barton MRN: [id number] Date of Birth: 30-Nov-1933  Today's Date: 06/01/2019 OT Missed Time: 46 Minutes Missed Time Reason: X-Ray  Pt missed 45 mins scheduled OT treatment session due to off unit for chest x-ray. Will continue to follow per POC.  [name] 06/01/2019, 12:53 PM

## 2019-06-01 NOTE — Progress Notes (Signed)
[place] PHYSICAL MEDICINE & REHABILITATION PROGRESS NOTE   Subjective/Complaints:  Slept well last night.  Worked hard in therapy.  Denies any shortness of breath.  Reviewed vitals as well as chest x-rays.  He received IV Lasix at 2300.  Difficulty getting IV access left arm according to patient report.   Review of systems negative for chest pain shortness of breath nausea vomiting diarrhea or constipation.  Objective:   Dg Chest 2 View  Result Date: 05/31/2019 CLINICAL DATA:  Lung infiltrate EXAM: CHEST - 2 VIEW COMPARISON:  [facility] 2020, 05/23/2019, CT 05/20/2019 FINDINGS: Small left pleural effusion and moderate right pleural effusion without significant change. Persistent consolidation at the right middle lobe and right greater than left lung base. Enlarged cardiomediastinal silhouette with central congestion. Aortic atherosclerosis. No pneumothorax. IMPRESSION: No significant interval change in moderate right and small left pleural effusion. Continued airspace consolidation at the right middle lobe and right greater than left lung base. Electronically Signed   By: Donavan Foil M.D.   On: 05/31/2019 19:16   Dg Chest 2 View  Result Date: 05/31/2019 CLINICAL DATA:  Hypoxia EXAM: CHEST - 2 VIEW COMPARISON:  05/23/2019 FINDINGS: Cardiac shadow is enlarged. Enlarging right-sided pleural effusion is noted with likely underlying atelectasis/infiltrate. Small left effusion is noted. No other focal abnormality is noted. IMPRESSION: Bilateral pleural effusions right greater than left increased when compared with the prior exam particularly on the right. Electronically Signed   By: Inez Catalina M.D.   On: 05/31/2019 12:34   Recent Labs    05/31/19 0346  WBC 11.7*  HGB 9.3*  HCT 31.8*  PLT 262   Recent Labs    05/31/19 0346 06/01/19 0334  NA 142 140  K 5.6* 5.1  CL 101 102  CO2 31 26  GLUCOSE 120* 120*  BUN 82* 93*  CREATININE 4.50* 4.56*  CALCIUM 8.9 8.9     Intake/Output Summary (Last 24 hours) at 06/01/2019 0721 Last data filed at 06/01/2019 0100 Gross per 24 hour  Intake 598 ml  Output 400 ml  Net 198 ml     Physical Exam: Vital Signs Blood pressure 114/67, pulse 68, temperature 98 F (36.7 C), temperature source Oral, resp. rate 18, height 5\' 10"  (1.778 m), weight 102.7 kg, SpO2 93 %.   General: No acute distress Mood and affect are appropriate Heart: Regular rate and rhythm no rubs murmurs or extra sounds Lungs: Clear to auscultation, breathing unlabored, no rales , +scattered wheezes Abdomen: Positive bowel sounds, soft nontender to palpation, nondistended Extremities: No clubbing, cyanosis, or edema Skin: No evidence of breakdown, no evidence of rash Neurologic: Cranial nerves II through XII intact, motor strength is 4/5 in bilateral deltoid, bicep, tricep, grip, hip flexor, knee extensors, ankle dorsiflexor and plantar flexor Sensory exam normal sensation to light touch and proprioception in bilateral upper and lower extremities Cerebellar exam normal finger to nose to finger as well as heel to shin in bilateral upper and lower extremities Musculoskeletal: Full range of motion in all 4 extremities. No joint swelling   Assessment/Plan: 1. Functional deficits secondary to debility which require 3+ hours per day of interdisciplinary therapy in a comprehensive inpatient rehab setting.  Physiatrist is providing close team supervision and 24 hour management of active medical problems listed below.  Physiatrist and rehab team continue to assess barriers to discharge/monitor patient progress toward functional and medical goals  Care Tool:  Bathing    Body parts bathed by patient: Right arm, Left arm,  Chest, Abdomen, Front perineal area, Buttocks, Right upper leg, Left upper leg, Face(Pt reports that he occassionally washes his feet but he did not during eval)         Bathing assist Assist Level: Supervision/Verbal cueing      Upper Body Dressing/Undressing Upper body dressing   What is the patient wearing?: Pull over shirt    Upper body assist Assist Level: Set up assist    Lower Body Dressing/Undressing Lower body dressing      What is the patient wearing?: Pants, Underwear/pull up     Lower body assist Assist for lower body dressing: Set up assist     Toileting Toileting    Toileting assist Assist for toileting: Supervision/Verbal cueing     Transfers Chair/bed transfer  Transfers assist     Chair/bed transfer assist level: Contact Guard/Touching assist     Locomotion Ambulation   Ambulation assist      Assist level: Contact Guard/Touching assist Assistive device: No Device Max distance: 61ft   Walk 10 feet activity   Assist     Assist level: Contact Guard/Touching assist Assistive device: No Device   Walk 50 feet activity   Assist    Assist level: Contact Guard/Touching assist Assistive device: No Device    Walk 150 feet activity   Assist Walk 150 feet activity did not occur: Safety/medical concerns         Walk 10 feet on uneven surface  activity   Assist Walk 10 feet on uneven surfaces activity did not occur: Safety/medical concerns         Wheelchair     Assist Will patient use wheelchair at discharge?: No(TBD but do not anticipate will need w/c unless for community mobility)             Wheelchair 50 feet with 2 turns activity    Assist            Wheelchair 150 feet activity     Assist          Medical Problem List and Plan: 1.   Debility secondary to pneumonia causing acute exacerbation of chronic respiratory failure CIR PT OT, tolerating well despite worsening renal status and pleural effusion on right greater than left side 2. Antithrombotics: -DVT/anticoagulation:Pharmaceutical:Other (comment)--Elquis -antiplatelet therapy: N/A 3. Pain Management:N/A 4. Mood:LCSW to follow for  evaluation and support. -antipsychotic agents: N/A 5. Neuropsych: This patientiscapable of making decisions on hisown behalf. 6. Skin/Wound Care:Routine pressure relief measures. 7. Fluids/Electrolytes/Nutrition:strict I/O. Check lytes in am. 8. COPD with chronic hypoxic failure:Pulmicort bid with 4liters oxygenduring the day and VM- 8L at nights. Wean to least amount needed to keep saturations 89-92%. Will need PCCM to evaluate oxygen needs/set sleep study prior to discharge. . Will order prn albuterol as well 9. Aspiration PNA:Has completed a week course ofAugmentin.Afebrile without cough  10: Afib/Aflutter:Now back on Eliquis--for DCCVin the futureafter pulmonary status improves? 11. Acute on Chronic systolic:On lasix 40 mg bid and Zebeta.Monitor daily weights as well as strict I/O.Marland Kitchen  Per Dr. Doyne Keel, pleural effusion is due to CHF 12. CKD stage PQ:[id number] by Dr. Moshe Cipro.Has refused HD creat up to 4.5, appreciate nephrology follow-up.  Creatinine stable on 6/12 .13. Sick Euthyroid?: Will need repeat TSH in 3-4 weeks    LOS: 2 days A FACE TO FACE EVALUATION WAS PERFORMED  Charlett Blake 06/01/2019, 7:21 AM

## 2019-06-01 NOTE — Progress Notes (Signed)
Inpatient Rehabilitation Center Individual Statement of Services  Patient Name:  David Barton  Date:  06/01/2019  Welcome to the [facility].  Our goal is to provide you with an individualized program based on your diagnosis and situation, designed to meet your specific needs.  With this comprehensive rehabilitation program, you will be expected to participate in at least 3 hours of rehabilitation therapies Monday-Friday, with modified therapy programming on the weekends.  Your rehabilitation program will include the following services:  Physical Therapy (PT), Occupational Therapy (OT), 24 hour per day rehabilitation nursing, Case Management (Social Worker), Rehabilitation Medicine, Nutrition Services and Pharmacy Services  Weekly team conferences will be held on Wednesdays to discuss your progress.  Your Social Worker will talk with you frequently to get your input and to update you on team discussions.  Team conferences with you and your family in attendance may also be held.  Expected length of stay:  5 to 9 days  Overall anticipated outcome:  Independent with assistive device; supervision for car transfers  Depending on your progress and recovery, your program may change. Your Social Worker will coordinate services and will keep you informed of any changes. Your Social Worker's name and contact numbers are listed  below.  The following services may also be recommended but are not provided by the [facility] will be made to provide these services after discharge if needed.  Arrangements include referral to agencies that provide these services.  Your insurance has been verified to be:  [employer] Your primary doctor is:  Dr. Maury Dus  Pertinent information will be shared with your doctor and your insurance  company.  Social Worker:  Alfonse Alpers, LCSW  [phone] or ([phone]  Information discussed with and copy given to patient by: Trey Sailors, 06/01/2019, 9:48 AM

## 2019-06-01 NOTE — Progress Notes (Signed)
Physical Therapy Session Note  Patient Details  Name: David Barton MRN: [id number] Date of Birth: January 25, 1933  Today's Date: 06/01/2019 PT Individual Time: 0915-1000 session 1, 1330:14:26 session 2 PT Individual Time Calculation (min): 45 min session 1, 56 min session 2 Short Term Goals: Week 1:  PT Short Term Goal 1 (Week 1): = to LTGs based on ELOS  Skilled Therapeutic Interventions/Progress Updates:  Session 1 Pt seated in chair on arrival to room getting meds from RN. Agreed to PT at this time.   Min guard assist to stand from recliner with UE assist, increased time/effort. Pt ambulated for 90 feet no AD, min guard assist with slight staggering gait pattern. No overt loss of balance. Supervision to sit to NuStep. On level 3 pt worked for 2 minutes, rested for 1-2 minutes for total of 8 minutes work, all with goal >/=40 steps per minutes using bil LE's/left UE (right UE too painful due to shoulder injury). After NuStep pt was too tired to try to walk back to room, therefore transferred with supervision to wheelchair. Had pt propel himself back to room with cues on technique for continued work on activity tolerance, supervision. In room pt needed supervision to transfer from wheelchair back to recliner. Seated in recliner pt performed the following ex's: long arc quads with 3# ankle weight, hamstring curls with level 2 resistance for 10 reps each. Cues needed to slow down and for form.   Pt left in recliner with feet down and all needs in reach. SaO2 levels remained stable on 4 lpm O2 via [place] with session.   Session 2 Pt in recliner and agreeable to therapy at this time.  Pt ambulated from room to mat table in hallway no device with min guard assist for balance. Mild veering noted with gait. Seated edge of mat pt performed 4 sets of 5 reps sit<>stands with no hands on thighs with 1st 2 sets, then hands on mat to assist with last 2 sets. Cues for full upright posture, and slow controlled  descent. Standing with HHA: alternating forward foot taps to aerobic step, 2 sets of 10 reps each leg. With UE support on rail in hall: heel raises, alternating hip abduction and alternating hip extension for 10 reps each, cues/assist for posture and ex form. Worked on core strengthening as follows: seated on air disc on mat table- with level 2 band shoulder horizontal abduction (low due to right shoulder issues), rows x 10 reps; alternating long arc quads, alternating marching x 10 reps each, progressing to combo contralateral UE/LE raises for 10 reps each side. Pt able to ambulate back to room with no AD, min guard to min assist due to mild instability from fatigue. Supervision for all sit<>stand transfers in session.   Pt left in recliner with alarm on and all needs in reach. Vitals stable on 3 lpm O2 via [place]. Pt easily fatigues and needs frequent rest breaks to catch breath and recover.   Therapy Documentation Precautions:  Precautions Precautions: Fall, Other (comment) Precaution Comments: monitor SpO2 (4L-oxygen dependent baseline) Restrictions Weight Bearing Restrictions: No    Therapy/Group: Individual Therapy  Willow Ora, PTA 06/01/2019, 3:44 PM

## 2019-06-01 NOTE — Progress Notes (Signed)
Occupational Therapy Session Note  Patient Details  Name: David Barton MRN: [id number] Date of Birth: 03/01/1933  Today's Date: 06/01/2019 OT Individual Time: 1224-4975 OT Individual Time Calculation (min): 40 min    Short Term Goals: Week 1:  OT Short Term Goal 1 (Week 1): STG = LTG due to length of stay  Skilled Therapeutic Interventions/Progress Updates:    Treatment session with focus on functional mobility and endurance during self-care tasks.  Pt received supine in bed completing breathing treatment. Discussed home routine and previous oxygen use.  Pt declined bathing this session, stating he typically bathes every other day.  Pt donned pants and shoes seated EOB with supervision and increased time due to decreased breath support - educated on pursed lip breathing.  Pt ambulated to bathroom without AD with supervision and completed grooming tasks in standing for endurance.  Pt returned to sitting EOB, O2 sats 88% returning to 92% after rest and focus on breathing.  Set up for breakfast while seated in recliner.  Pt left upright in recliner with all needs in reach and chair alarm on.  Therapy Documentation Precautions:  Precautions Precautions: Fall, Other (comment) Precaution Comments: monitor SpO2 (4L-oxygen dependent baseline) Restrictions Weight Bearing Restrictions: No General:   Vital Signs: Therapy Vitals Temp: 98 F (36.7 C) Temp Source: Oral Pulse Rate: 68 Resp: 18 BP: 114/67 Patient Position (if appropriate): Lying Oxygen Therapy SpO2: 90 % O2 Device: Nasal Cannula O2 Flow Rate (L/min): 4 L/min Pain:  Pt with no c/o pain   Therapy/Group: Individual Therapy  [name] 06/01/2019, 8:12 AM

## 2019-06-01 NOTE — Progress Notes (Signed)
Pt made aware of new plan of care. Agreed to Covid and thoracentesis but has more questions for the MD. Informed that MD will come to discuss specifics prior to procedure. Covid test performed via nasopharyngeal route. Pt tolerated well. Specimen walked to lab and results pending.

## 2019-06-01 NOTE — Progress Notes (Signed)
Discussed patient with Dr. Augustin Coupe as well as minimal improvement in effusion with weight down by 1 lbs. He doubts that diuresis with resolve effusion and recommended thoracocentesis as well as aggressive diuresis with 80 mg bid X one day with monitoring of renal status.  Orders written. Daily BMET ordered. Will change lasix back to IV 40 mg bid starting tomorrow evening. Dr. Augustin Coupe will follow for additional input. Patient again instructed on need to monitor I/O.

## 2019-06-01 NOTE — Consult Note (Signed)
Reason for Consult: Renal failure Referring Physician:  Dr. Reesa Chew  Chief Complaint: Dyspnea  Assessment/Plan: 1. 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  - Stopped the Fleet enema; high risk for causing AKI and fortunately pt did not receive any. - Floor scale weights only; 101.5kg 05/26/19. - Lasix 80mg  IV BID x1 day and will evaluate response. - Strict I&O's; avoid nephrotoxins as you are already doing. - WIll also check a FeUrea given pt is on Lasix 2. CHF with echo 6/1 EF 45-50% - has been on Lasix 40mg  BID but then converted to 40mg  IV q12hrs (has only rx 2 dose) 3. Aflutter - failed TEE DCCV x2 03/16/19 4. Acute on chronic hypoxic respiratory failure 5. Anemia  - Will check an iron panel for now; likely will need ESA.     HPI: David Barton is an 85 y.o. male COPD sCHF EF 30% in 2017 which subsequently normalized (currently 45-50%) , Afib w/ TEE CV, HTN CKD IV, aocohol abuse , colon cancer who recently presented w/ CP and found to have a new right sided consolidation with moderate pleural effusion s/p thoracentesis 05/21/2019 (1.85L) thought to be a parapneumonic exudate and completed Abx course. He was COVID19 neg x2. BL creatinine is ~2.5 but it has fluctuated with diuretics; transferred to CIR. He was down to 101.5kg 05/26/19.   ROS Pertinent items are noted in HPI.  Chemistry and CBC: Creatinine, Ser  Date/Time Value Ref Range Status  06/01/2019 03:34 AM 4.56 (H) 0.61 - 1.24 mg/dL Final  05/31/2019 03:46 AM 4.50 (H) 0.61 - 1.24 mg/dL Final  05/27/2019 06:45 AM 3.12 (H) 0.61 - 1.24 mg/dL Final  05/26/2019 05:46 AM 3.14 (H) 0.61 - 1.24 mg/dL Final  05/25/2019 05:33 AM 3.25 (H) 0.61 - 1.24 mg/dL Final  05/24/2019 04:54 AM 3.40 (H) 0.61 - 1.24 mg/dL Final  05/23/2019 03:30 AM 3.41 (H) 0.61 - 1.24 mg/dL Final  05/22/2019 03:07 AM 3.71 (H) 0.61 - 1.24 mg/dL Final  05/21/2019 05:24 AM  3.70 (H) 0.61 - 1.24 mg/dL Final  05/20/2019 03:01 AM 2.93 (H) 0.61 - 1.24 mg/dL Final  05/19/2019 09:01 AM 2.86 (H) 0.61 - 1.24 mg/dL Final  05/04/2019 11:37 AM 3.07 (H) 0.40 - 1.50 mg/dL Final  03/26/2019 04:33 AM 3.14 (H) 0.61 - 1.24 mg/dL Final  03/25/2019 04:24 AM 3.18 (H) 0.61 - 1.24 mg/dL Final  03/24/2019 02:46 AM 2.99 (H) 0.61 - 1.24 mg/dL Final  03/23/2019 02:44 AM 2.99 (H) 0.61 - 1.24 mg/dL Final  03/22/2019 03:50 AM 2.79 (H) 0.61 - 1.24 mg/dL Final  03/21/2019 02:35 AM 2.72 (H) 0.61 - 1.24 mg/dL Final  03/20/2019 04:31 AM 2.65 (H) 0.61 - 1.24 mg/dL Final  03/19/2019 05:43 AM 2.51 (H) 0.61 - 1.24 mg/dL Final  03/18/2019 04:03 AM 2.41 (H) 0.61 - 1.24 mg/dL Final  03/17/2019 03:17 AM 2.64 (H) 0.61 - 1.24 mg/dL Final  03/16/2019 07:57 AM 2.50 (H) 0.61 - 1.24 mg/dL Final  03/16/2019 02:37 AM 2.64 (H) 0.61 - 1.24 mg/dL Final  03/15/2019 03:59 AM 2.56 (H) 0.61 - 1.24 mg/dL Final  03/14/2019 04:05 AM 2.40 (H) 0.61 - 1.24 mg/dL Final  03/13/2019 04:22 AM 2.46 (H) 0.61 - 1.24 mg/dL Final  03/12/2019 05:47 AM 2.47 (H) 0.61 - 1.24 mg/dL Final  03/11/2019 04:38 AM 2.29 (H) 0.61 - 1.24 mg/dL Final  03/10/2019 06:21 PM 2.42 (H) 0.61 - 1.24 mg/dL  Final  03/10/2019 11:30 AM 2.10 (H) 0.61 - 1.24 mg/dL Final  03/09/2019 03:59 AM 1.96 (H) 0.61 - 1.24 mg/dL Final  03/07/2019 09:29 AM 2.30 (H) 0.61 - 1.24 mg/dL Final  03/05/2019 05:39 AM 3.62 (H) 0.61 - 1.24 mg/dL Final  03/04/2019 03:29 AM 4.42 (H) 0.61 - 1.24 mg/dL Final  03/03/2019 11:37 PM 4.31 (H) 0.61 - 1.24 mg/dL Final  03/03/2019 02:09 PM 4.36 (H) 0.61 - 1.24 mg/dL Final  03/03/2019 09:47 AM 4.50 (H) 0.61 - 1.24 mg/dL Final  12/21/2018 10:45 AM 1.95 (H) 0.76 - 1.27 mg/dL Final  12/21/2018 10:38 AM 1.93 (H) 0.76 - 1.27 mg/dL Final  11/20/2016 05:45 AM 1.31 (H) 0.61 - 1.24 mg/dL Final  11/19/2016 05:07 AM 1.27 (H) 0.61 - 1.24 mg/dL Final  11/18/2016 05:43 AM 1.23 0.61 - 1.24 mg/dL Final  11/17/2016 05:39 AM 1.20 0.61 - 1.24 mg/dL Final   11/16/2016 05:20 AM 1.19 0.61 - 1.24 mg/dL Final  11/15/2016 05:28 AM 1.24 0.61 - 1.24 mg/dL Final  11/14/2016 05:29 AM 1.36 (H) 0.61 - 1.24 mg/dL Final  11/13/2016 05:26 AM 1.45 (H) 0.61 - 1.24 mg/dL Final  11/11/2016 04:39 AM 1.23 0.61 - 1.24 mg/dL Final  11/10/2016 03:31 AM 1.36 (H) 0.61 - 1.24 mg/dL Final  11/09/2016 06:20 AM 1.51 (H) 0.61 - 1.24 mg/dL Final  11/08/2016 06:06 PM 1.47 (H) 0.61 - 1.24 mg/dL Final   Recent Labs  Lab 05/26/19 0546 05/27/19 0645 05/31/19 0346 06/01/19 0334  NA 145 144 142 140  K 4.8 4.3 5.6* 5.1  CL 104 103 101 102  CO2 32 31 31 26   GLUCOSE 100* 96 120* 120*  BUN 65* 61* 82* 93*  CREATININE 3.14* 3.12* 4.50* 4.56*  CALCIUM 9.5 9.5 8.9 8.9   Recent Labs  Lab 05/31/19 0346  WBC 11.7*  NEUTROABS 8.9*  HGB 9.3*  HCT 31.8*  MCV 109.7*  PLT 262   Liver Function Tests: Recent Labs  Lab 05/31/19 0346  AST 14*  ALT 12  ALKPHOS 58  BILITOT 0.5  PROT 6.3*  ALBUMIN 2.7*   No results for input(s): LIPASE, AMYLASE in the last 168 hours. No results for input(s): AMMONIA in the last 168 hours. Cardiac Enzymes: No results for input(s): CKTOTAL, CKMB, CKMBINDEX, TROPONINI in the last 168 hours. Iron Studies: No results for input(s): IRON, TIBC, TRANSFERRIN, FERRITIN in the last 72 hours. PT/INR: @LABRCNTIP (inr:5)  Xrays/Other Studies: ) Results for orders placed or performed during the hospital encounter of 05/30/19 (from the past 48 hour(s))  CBC WITH DIFFERENTIAL     Status: Abnormal   Collection Time: 05/31/19  3:46 AM  Result Value Ref Range   WBC 11.7 (H) 4.0 - 10.5 K/uL   RBC 2.90 (L) 4.22 - 5.81 MIL/uL   Hemoglobin 9.3 (L) 13.0 - 17.0 g/dL   HCT 31.8 (L) 39.0 - 52.0 %   MCV 109.7 (H) 80.0 - 100.0 fL   MCH 32.1 26.0 - 34.0 pg   MCHC 29.2 (L) 30.0 - 36.0 g/dL   RDW 16.5 (H) 11.5 - 15.5 %   Platelets 262 150 - 400 K/uL   nRBC 0.0 0.0 - 0.2 %   Neutrophils Relative % 75 %   Neutro Abs 8.9 (H) 1.7 - 7.7 K/uL   Lymphocytes  Relative 10 %   Lymphs Abs 1.1 0.7 - 4.0 K/uL   Monocytes Relative 12 %   Monocytes Absolute 1.4 (H) 0.1 - 1.0 K/uL   Eosinophils Relative 2 %   Eosinophils  Absolute 0.2 0.0 - 0.5 K/uL   Basophils Relative 0 %   Basophils Absolute 0.0 0.0 - 0.1 K/uL   Immature Granulocytes 1 %   Abs Immature Granulocytes 0.08 (H) 0.00 - 0.07 K/uL    Comment: Performed at [facility] [address] [place], [place] [address]  Comprehensive metabolic panel     Status: Abnormal   Collection Time: 05/31/19  3:46 AM  Result Value Ref Range   Sodium 142 135 - 145 mmol/L   Potassium 5.6 (H) 3.5 - 5.1 mmol/L   Chloride 101 98 - 111 mmol/L   CO2 31 22 - 32 mmol/L   Glucose, Bld 120 (H) 70 - 99 mg/dL   BUN 82 (H) 8 - 23 mg/dL   Creatinine, Ser 4.50 (H) 0.61 - 1.24 mg/dL   Calcium 8.9 8.9 - 10.3 mg/dL   Total Protein 6.3 (L) 6.5 - 8.1 g/dL   Albumin 2.7 (L) 3.5 - 5.0 g/dL   AST 14 (L) 15 - 41 U/L   ALT 12 0 - 44 U/L   Alkaline Phosphatase 58 38 - 126 U/L   Total Bilirubin 0.5 0.3 - 1.2 mg/dL   GFR calc non Af Amer 11 (L) >60 mL/min   GFR calc Af Amer 13 (L) >60 mL/min   Anion gap 10 5 - 15    Comment: Performed at [facility], [place] [address], [place], [place] [address]  Basic metabolic panel     Status: Abnormal   Collection Time: 06/01/19  3:34 AM  Result Value Ref Range   Sodium 140 135 - 145 mmol/L   Potassium 5.1 3.5 - 5.1 mmol/L   Chloride 102 98 - 111 mmol/L   CO2 26 22 - 32 mmol/L   Glucose, Bld 120 (H) 70 - 99 mg/dL   BUN 93 (H) 8 - 23 mg/dL   Creatinine, Ser 4.56 (H) 0.61 - 1.24 mg/dL   Calcium 8.9 8.9 - 10.3 mg/dL   GFR calc non Af Amer 11 (L) >60 mL/min   GFR calc Af Amer 13 (L) >60 mL/min   Anion gap 12 5 - 15    Comment: Performed at [facility], [place] [address] [place], [place] [address]  SARS Coronavirus 2 (CEPHEID - Performed in [facility] hospital lab), Hosp Order     Status: None   Collection Time: 06/01/19  4:32 PM   Specimen: Nasopharyngeal Swab   Result Value Ref Range   SARS Coronavirus 2 NEGATIVE NEGATIVE    Comment: (NOTE) If result is NEGATIVE SARS-CoV-2 target nucleic acids are NOT DETECTED. The SARS-CoV-2 RNA is generally detectable in upper and lower  respiratory specimens during the acute phase of infection. The lowest  concentration of SARS-CoV-2 viral copies this assay can detect is 250  copies / mL. A negative result does not preclude SARS-CoV-2 infection  and should not be used as the sole basis for treatment or other  patient management decisions.  A negative result may occur with  improper specimen collection / handling, submission of specimen other  than nasopharyngeal swab, presence of viral mutation(s) within the  areas targeted by this assay, and inadequate number of viral copies  (<250 copies / mL). A negative result must be combined with clinical  observations, patient history, and epidemiological information. If result is POSITIVE SARS-CoV-2 target nucleic acids are DETECTED. The SARS-CoV-2 RNA is generally detectable in upper and lower  respiratory specimens dur ing the acute phase of infection.  Positive  results are indicative of active infection with SARS-CoV-2.  Clinical  correlation with patient history and other diagnostic information is  necessary to determine patient infection status.  Positive results do  not rule out bacterial infection or co-infection with other viruses. If result is PRESUMPTIVE POSTIVE SARS-CoV-2 nucleic acids MAY BE PRESENT.   A presumptive positive result was obtained on the submitted specimen  and confirmed on repeat testing.  While 2019 novel coronavirus  (SARS-CoV-2) nucleic acids may be present in the submitted sample  additional confirmatory testing may be necessary for epidemiological  and / or clinical management purposes  to differentiate between  SARS-CoV-2 and other Sarbecovirus currently known to infect humans.  If clinically indicated additional testing with  an alternate test  methodology [phone]) is advised. The SARS-CoV-2 RNA is generally  detectable in upper and lower respiratory sp ecimens during the acute  phase of infection. The expected result is Negative. Fact Sheet for Patients:  StrictlyIdeas.no Fact Sheet for Healthcare Providers: BankingDealers.co.za This test is not yet approved or cleared by the [place] FDA and has been authorized for detection and/or diagnosis of SARS-CoV-2 by FDA under an Emergency Use Authorization (EUA).  This EUA will remain in effect (meaning this test can be used) for the duration of the COVID-19 declaration under Section 564(b)(1) of the Act, 21 U.S.C. section 360bbb-3(b)(1), unless the authorization is terminated or revoked sooner. Performed at [facility], [place] [address] [place], [place] [address]    Dg Chest 2 View  Result Date: 06/01/2019 CLINICAL DATA:  Shortness of breath and weakness. EXAM: CHEST - 2 VIEW COMPARISON:  May 31, 2019 FINDINGS: Effusion and opacity in the right base is stable. A small effusion with underlying atelectasis in the left base is stable. The cardiomediastinal silhouette is unchanged. No pneumothorax. No other changes. IMPRESSION: Right greater than left pleural effusions with underlying atelectasis. No other interval changes or acute abnormalities. Electronically Signed   By: Dorise Bullion III M.D   On: 06/01/2019 12:20   Dg Chest 2 View  Result Date: 05/31/2019 CLINICAL DATA:  Lung infiltrate EXAM: CHEST - 2 VIEW COMPARISON:  [facility] [place] 2020, 05/23/2019, CT 05/20/2019 FINDINGS: Small left pleural effusion and moderate right pleural effusion without significant change. Persistent consolidation at the right middle lobe and right greater than left lung base. Enlarged cardiomediastinal silhouette with central congestion. Aortic atherosclerosis. No pneumothorax. IMPRESSION: No significant interval change in  moderate right and small left pleural effusion. Continued airspace consolidation at the right middle lobe and right greater than left lung base. Electronically Signed   By: Donavan Foil M.D.   On: 05/31/2019 19:16   Dg Chest 2 View  Result Date: 05/31/2019 CLINICAL DATA:  Hypoxia EXAM: CHEST - 2 VIEW COMPARISON:  05/23/2019 FINDINGS: Cardiac shadow is enlarged. Enlarging right-sided pleural effusion is noted with likely underlying atelectasis/infiltrate. Small left effusion is noted. No other focal abnormality is noted. IMPRESSION: Bilateral pleural effusions right greater than left increased when compared with the prior exam particularly on the right. Electronically Signed   By: Inez Catalina M.D.   On: 05/31/2019 12:34    PMH:   Past Medical History:  Diagnosis Date  . Alcohol abuse   . Colon cancer ([place])   . COPD (chronic obstructive pulmonary disease) ([place])   . Emphysema of lung ([place])   . Former tobacco use   . Hypertension   . Peripheral edema   . Sepsis ([place]) 10/2016   Aspiration  PNA/C diff colitis    PSH:   Past Surgical History:  Procedure Laterality Date  . CARDIOVERSION N/A 03/16/2019   Procedure: CARDIOVERSION;  Surgeon: Pixie Casino, MD;  Location: [facility] ENDOSCOPY;  Service: Cardiovascular;  Laterality: N/A;  . CARDIOVERSION N/A 03/23/2019   Procedure: CARDIOVERSION;  Surgeon: Lelon Perla, MD;  Location: [facility] ENDOSCOPY;  Service: Cardiovascular;  Laterality: N/A;  . CATARACT EXTRACTION    . COLON RESECTION    . COLONOSCOPY    . IMPLANTATION BONE ANCHORED HEARING AID Left 2016  . IR THORACENTESIS ASP PLEURAL SPACE W/IMG GUIDE  05/21/2019  . MINOR HEMORRHOIDECTOMY  1995  . TEE WITHOUT CARDIOVERSION N/A 03/16/2019   Procedure: TRANSESOPHAGEAL ECHOCARDIOGRAM (TEE);  Surgeon: Pixie Casino, MD;  Location: [facility] ENDOSCOPY;  Service: Cardiovascular;  Laterality: N/A;  . TONSILLECTOMY      Allergies:  Allergies  Allergen Reactions  . Flexeril [Cyclobenzaprine] Other (See  Comments)    Unknown reaction     Medications:   Prior to Admission medications   Medication Sig Start Date End Date Taking? Authorizing Provider  allopurinol (ZYLOPRIM) 100 MG tablet Take 100 mg by mouth daily. 02/17/19   [provider]  amiodarone (PACERONE) 200 MG tablet Take 1 tablet (200 mg total) by mouth daily. 04/17/19   Erlene Quan, PA-C  apixaban (ELIQUIS) 2.5 MG TABS tablet Take 1 tablet (2.5 mg total) by mouth 2 (two) times daily. 05/30/19   Kayleen Memos, DO  bisoprolol (ZEBETA) 5 MG tablet Take 5 mg by mouth daily.    [provider]  budesonide (PULMICORT) 0.25 MG/2ML nebulizer solution Take 2 mLs (0.25 mg total) by nebulization 2 (two) times daily. 05/30/19   Kayleen Memos, DO  ferrous sulfate 325 (65 FE) MG tablet Take 325 mg by mouth daily.     [provider]  furosemide (LASIX) 40 MG tablet Take 1 tablet (40 mg total) by mouth 2 (two) times daily. 05/30/19   Kayleen Memos, DO  ipratropium-albuterol (DUONEB) 0.5-2.5 (3) MG/3ML SOLN Use twice a day scheduled and every 4 hours as needed for shortness of breath and wheezing Patient not taking: Reported on 05/19/2019 03/26/19   Thurnell Lose, MD  Multiple Vitamin (MULTIVITAMIN WITH MINERALS) TABS tablet Take 1 tablet by mouth daily.    [provider]  OXYGEN Inhale 4 L into the lungs continuous.    [provider]    Discontinued Meds:   Medications Discontinued During This Encounter  Medication Reason  . furosemide (LASIX) tablet 40 mg   . furosemide (LASIX) tablet 40 mg   . furosemide (LASIX) injection 40 mg   . ipratropium-albuterol (DUONEB) 0.5-2.5 (3) MG/3ML nebulizer solution 3 mL   . ipratropium-albuterol (DUONEB) 0.5-2.5 (3) MG/3ML nebulizer solution 3 mL   . furosemide (LASIX) injection 40 mg     Social History:  reports that he quit smoking about 37 years ago. His smoking use included cigarettes. He has a 20.00 pack-year smoking history. He has never used  smokeless tobacco. He reports current alcohol use of about 7.0 standard drinks of alcohol per week. He reports that he does not use drugs.  Family History:   Family History  Problem Relation Age of Onset  . Brain cancer Father   . Colon cancer Mother        with mets to liver  . Lung cancer Brother        was a smoker    Blood pressure (!) 97/56, pulse  66, temperature 98 F (36.7 C), temperature source Oral, resp. rate 18, height 5\' 10"  (1.778 m), weight 102.7 kg, SpO2 96 %. General appearance: alert, cooperative and appears stated age Head: Normocephalic, without obvious abnormality, atraumatic Eyes: negative Neck: no adenopathy, no carotid bruit, supple, symmetrical, trachea midline and thyroid not enlarged, symmetric, no tenderness/mass/nodules Back: symmetric, no curvature. ROM normal. No CVA tenderness. Resp: diminished breath sounds bilaterally Chest wall: no tenderness Cardio: regular rate and rhythm, S1, S2 normal, no murmur, click, rub or gallop GI: soft, non-tender; bowel sounds normal; no masses,  no organomegaly Extremities: edema firm below the knee but no real pitting Neurologic: Grossly normal       Haylen Bellotti, Hunt Oris, MD 06/01/2019, 6:58 PM

## 2019-06-01 NOTE — Progress Notes (Addendum)
Pt had unwitnessed episode of urinary continence in the toilet. States he went to the bathroom and emptied his urinal. Discouraged independent ambulation without staff presence and  educated on importance of incentive spirometry and accurate documentation of input and output. Urinal at bedside and encouraged to use and notify nursing when done. Pt agrees to comply for safety.

## 2019-06-01 NOTE — IPOC Note (Signed)
Overall Plan of Care [facility]) Patient Details Name: David Barton MRN: [id number] DOB: 02-02-1933  Admitting Diagnosis: [facility] Problems: Principal Problem:   Debility Active Problems:   Chronic diastolic heart failure ([name])   Atrial fibrillation with RVR ([name])   HCAP (healthcare-associated pneumonia)   Acute on chronic respiratory failure ([name])     Functional Problem List: Nursing Endurance, Medication Management, Motor, Perception, Safety, Skin Integrity  PT Balance, Endurance, Motor, Safety, Edema  OT Endurance  SLP    TR         Basic ADL's: OT Grooming, Bathing, Dressing, Toileting     Advanced  ADL's: OT       Transfers: PT Bed Mobility, Bed to Chair, Car, Manufacturing systems engineer, Other (comment)(walk in shower)     Locomotion: PT Ambulation, Stairs     Additional Impairments: OT    SLP        TR      Anticipated Outcomes Item Anticipated Outcome  Self Feeding Independent  Swallowing      Basic self-care  Independent  Toileting  Independent   Bathroom Transfers Modified Independent  Bowel/Bladder  Patient remains continent of B&B x 2.   Transfers  mod-I  Locomotion  mod-I  Communication     Cognition     Pain  < 3  Safety/Judgment  Patient remains free from falls and injury.    Therapy Plan: PT Intensity: Minimum of 1-2 x/day ,45 to 90 minutes PT Frequency: 5 out of 7 days PT Duration Estimated Length of Stay: 7-9 days OT Intensity: Minimum of 1-2 x/day, 45 to 90 minutes OT Frequency: 5 out of 7 days OT Duration/Estimated Length of Stay: 5-7 days     Due to the current state of emergency, patients may not be receiving their 3-hours of Medicare-mandated therapy.   Team Interventions: Nursing Interventions Patient/Family Education, Skin Care/Wound Management, Psychosocial Support, Disease Management/Prevention, Medication Management, Discharge Planning  PT interventions Ambulation/gait training, Community reintegration,  DME/adaptive equipment instruction, Neuromuscular re-education, Psychosocial support, Stair training, UE/LE Strength taining/ROM, Training and development officer, Discharge planning, Functional electrical stimulation, Pain management, Therapeutic Activities, Skin care/wound management, UE/LE Coordination activities, Cognitive remediation/compensation, Disease management/prevention, Functional mobility training, Patient/family education, Splinting/orthotics, Therapeutic Exercise, Visual/perceptual remediation/compensation  OT Interventions Training and development officer, DME/adaptive equipment instruction, Patient/family education, Therapeutic Activities, Therapeutic Exercise, Community reintegration, Self Care/advanced ADL retraining, UE/LE Strength taining/ROM, Discharge planning, Neuromuscular re-education, Pain management  SLP Interventions    TR Interventions    SW/CM Interventions Discharge Planning, Psychosocial Support, Patient/Family Education   Barriers to Discharge MD  Medical stability  Nursing Medication compliance    PT Inaccessible home environment, Decreased caregiver support, Home environment access/layout, Lack of/limited family support, Other (comments) poor cardiovascular endurance  OT      SLP      SW       Team Discharge Planning: Destination: PT-Home ,OT- Home , SLP-  Projected Follow-up: PT-Home health PT, OT-  None, SLP-  Projected Equipment Needs: PT-To be determined, Other (comment), OT- None recommended by OT, SLP-  Equipment Details: PT-pt has RW, OT-  Patient/family involved in discharge planning: PT- Patient,  OT-Patient, SLP-   MD ELOS: 7-10d Medical Rehab Prognosis:  Good Assessment:  85 year old male with history of chronic hypoxic respiratory failure- 4L oxygen dependent, COPD,h/o alcohol abuse,CKD IV--has refused dialysis.recent admission for septic shock with renal failuredue to CAP which was complicated by onset of A fib and failed DCCV. He  wasreadmitted on 05/19/19 with recurrent acute on  chronic respiratory failure due to recurrent PNA withmarked worsening ofRLL collapse with right pleural effusionand new small pericardial effusion.He was placed on BIPAP, IV antibiotics and gentle diuresis. He refused BIPAP required 15 L high flow oxygen. 2 D echo showed EF 45% with hypokinesis of inferolateral, inferoseptal and infero-apical myocardium. Xarelto transitioned to IV heparin. Cardiology consulted for input due to recent decline in EF and felt acute on chronic respiratory failure was pulmonary in nature and that effusion was parapneumonia. On 6/1, he underwent right thoracocentesis of 1.8 L dark red fluid and has had some bleeding from site. MBS done revealing functional swallow without penetration or aspiration on regular, thins.   BIPAP discontinued as patient has been refusing to wear it.   Now requiring 24/7 Rehab RN,MD, as well as CIR level PT, OT  Treatment team will focus on ADLs and mobility with goals set at Mod I See Team Conference Notes for weekly updates to the plan of care

## 2019-06-01 NOTE — Plan of Care (Signed)
  Problem: RH SKIN INTEGRITY Goal: RH STG SKIN FREE OF INFECTION/BREAKDOWN Description: Patient skin will remain free from breakdown or infection during stay with Minimal assist.  Outcome: Progressing   Problem: RH KNOWLEDGE DEFICIT GENERAL Goal: RH STG INCREASE KNOWLEDGE OF SELF CARE AFTER HOSPITALIZATION Outcome: Progressing

## 2019-06-01 NOTE — Progress Notes (Signed)
Social Work Assessment and Plan  Patient Details  Name: David Barton MRN: [id number] Date of Birth: 06-22-1933  Today's Date: 05/31/2019  Problem List:  Patient Active Problem List   Diagnosis Date Noted  . Debility 05/30/2019  . Acute on chronic respiratory failure ([place]) 05/19/2019  . Chronic respiratory failure with hypoxia and hypercapnia ([place]) 05/06/2019  . DOE (dyspnea on exertion) 05/04/2019  . HCAP (healthcare-associated pneumonia) 04/25/2019  . Chronic respiratory failure with hypoxia ([place]) 04/25/2019  . History of anemia due to chronic kidney disease 04/17/2019  . Pressure injury of skin 03/17/2019  . Atrial fibrillation with RVR ([place]) 03/11/2019  . Chronic diastolic heart failure ([place]) 01/17/2019  . Edema of both legs 01/02/2019  . Pulmonary edema   . Hypomagnesemia 11/15/2016  . Sleep apnea   . Sepsis ([place]) 11/10/2016  . Elevated troponin 11/10/2016  . Acute diastolic CHF (congestive heart failure) ([place]) 11/10/2016  . Cardiomyopathy ([place]) 11/10/2016  . PSVT (paroxysmal supraventricular tachycardia) ([place]) 11/10/2016  . Dysphagia   . Renal insufficiency   . Enteritis due to Clostridium difficile   . Aspiration pneumonia ([place]) 11/04/2016  . Diarrhea 11/04/2016  . AKI (acute kidney injury) ([place]) 11/04/2016  . Hyperkalemia 11/04/2016  . Essential hypertension 05/31/2013  . COPD with acute exacerbation ([place]) 05/29/2013  . Hypokalemia 05/08/2013  . Hypotension 05/08/2013  . Dizziness 05/08/2013  . Hypoxia 05/08/2013  . Acute respiratory failure ([place]) 05/08/2013   Past Medical History:  Past Medical History:  Diagnosis Date  . Alcohol abuse   . Colon cancer ([place])   . COPD (chronic obstructive pulmonary disease) ([place])   . Emphysema of lung ([place])   . Former tobacco use   . Hypertension   . Peripheral edema   . Sepsis ([place]) 10/2016   Aspiration PNA/C diff colitis   Past Surgical History:  Past Surgical History:  Procedure Laterality Date  .  CARDIOVERSION N/A 03/16/2019   Procedure: CARDIOVERSION;  Surgeon: Pixie Casino, MD;  Location: [facility] ENDOSCOPY;  Service: Cardiovascular;  Laterality: N/A;  . CARDIOVERSION N/A 03/23/2019   Procedure: CARDIOVERSION;  Surgeon: Lelon Perla, MD;  Location: [facility] ENDOSCOPY;  Service: Cardiovascular;  Laterality: N/A;  . CATARACT EXTRACTION    . COLON RESECTION    . COLONOSCOPY    . IMPLANTATION BONE ANCHORED HEARING AID Left 2016  . IR THORACENTESIS ASP PLEURAL SPACE W/IMG GUIDE  05/21/2019  . MINOR HEMORRHOIDECTOMY  1995  . TEE WITHOUT CARDIOVERSION N/A 03/16/2019   Procedure: TRANSESOPHAGEAL ECHOCARDIOGRAM (TEE);  Surgeon: Pixie Casino, MD;  Location: [facility] ENDOSCOPY;  Service: Cardiovascular;  Laterality: N/A;  . TONSILLECTOMY     Social History:  reports that he quit smoking about 37 years ago. His smoking use included cigarettes. He has a 20.00 pack-year smoking history. He has never used smokeless tobacco. He reports current alcohol use of about 7.0 standard drinks of alcohol per week. He reports that he does not use drugs.  Family / Support Systems Marital Status: Married How Long?: 36 years Patient Roles: Spouse, Parent, Other (Comment)(grandparent; friend) Spouse/Significant Other: Edwen Mclester - wife - [phone] Children: 1 son ([place]); 1 dtr Regulatory affairs officer) Other Supports: friends Anticipated Caregiver: self and wife Ability/Limitations of Caregiver: min assist, but she also works full time Careers adviser: Evenings only(weekends and PRN) Family Dynamics: close, supportive family  Social History Preferred language: English Religion:  Education: college Read: Yes Write: Yes Employment Status: Retired Date Retired/Disabled/Unemployed: 2012 Age Retired: 15 Public relations account executive Issues: none reported Guardian/Conservator:  N/A - MD has determined that pt is capable of making his own decisions.   Abuse/Neglect Abuse/Neglect Assessment Can Be Completed:  Yes Physical Abuse: Denies Verbal Abuse: Denies Sexual Abuse: Denies Exploitation of patient/patient's resources: Denies Self-Neglect: Denies  Emotional Status Pt's affect, behavior and adjustment status: Pt with positive, upbeat attitude who is motivated to work hard and get home. Recent Psychosocial Issues: Pt with multiple falls and hospitalizations over the last year.  He is declining dialysis. Psychiatric History: none reported Substance Abuse History: none reported  Patient / Family Perceptions, Expectations & Goals Pt/Family understanding of illness & functional limitations: Pt/wife have a good understanding of pt's condition and limitations. Premorbid pt/family roles/activities: Pt used to golf, but his shoulder prohibits this now.  He reads a lot, drives his convertible mustang with the dog, and spends time with family. Anticipated changes in roles/activities/participation: Pt hopes to resume most of the above activities, as he is able. Pt/family expectations/goals: Wife wants pt's O2 to 2L before he comes home.  Pt wants to get stronger and not end up back in the hospital again.  Community [employer] Agencies: None Premorbid Home Care/DME Agencies: Other (Comment)([facility] for DME/O2 adn [place]) Transportation available at discharge: family Resource referrals recommended: Neuropsychology, Psychology, Support group (specify)  Discharge Planning Living Arrangements: Spouse/significant other Support Systems: Spouse/significant other, Children, Other relatives, Friends/neighbors Type of Residence: Private residence Insurance Resources: Multimedia programmer (specify)(United Electrical engineer) Financial Resources: Social Security, Family Support(wife is still working full time) Museum/gallery curator Screen Referred: No Money Management: Patient Does the patient have any problems obtaining your medications?: No Home Management: Pt used to do what he could.  Pt's wife  does some of this and they have hired people for house cleaning and other household tasks. Patient/Family Preliminary Plans: Pt plans to return to his home and his wife will assist as needed, but pt has mod I goals and wife will likely continue to work. Social Work Anticipated Follow Up Needs: HH/OP Expected length of stay: 5 to 9 days  Clinical Impression CSW met with pt and spoke with his wife via telephone to introduce self and role of CSW, as well as to complete assessment.  Pt was talkative with CSW and reports feeling pretty good emotionally, he just doesn't want to go home and end up right back in the hospital like last time.  Pt was pretty active at home prior to initial illness, per wife.  She says he's "not your typical 85 year old."  Pt had HH PTA and has a walker, shower seat, BSC, and O2 at home.  Wife wanted a medical update and CSW let Reesa Chew, PA-C know and she called wife.  No other concerns/needs/questions.  CSW will continue to follow and assist as needed.  Grant Henkes, Silvestre Mesi 06/01/2019, 10:16 AM

## 2019-06-02 ENCOUNTER — Inpatient Hospital Stay (HOSPITAL_COMMUNITY): Payer: Medicare Other

## 2019-06-02 ENCOUNTER — Other Ambulatory Visit (HOSPITAL_COMMUNITY): Payer: Medicare Other

## 2019-06-02 ENCOUNTER — Inpatient Hospital Stay (HOSPITAL_COMMUNITY): Payer: Medicare Other | Admitting: Occupational Therapy

## 2019-06-02 LAB — BASIC METABOLIC PANEL
Anion gap: 13 (ref 5–15)
BUN: 101 mg/dL — ABNORMAL HIGH (ref 8–23)
CO2: 29 mmol/L (ref 22–32)
Calcium: 8.8 mg/dL — ABNORMAL LOW (ref 8.9–10.3)
Chloride: 99 mmol/L (ref 98–111)
Creatinine, Ser: 5 mg/dL — ABNORMAL HIGH (ref 0.61–1.24)
GFR calc Af Amer: 11 mL/min — ABNORMAL LOW (ref >60)
GFR calc non Af Amer: 10 mL/min — ABNORMAL LOW (ref >60)
Glucose, Bld: 105 mg/dL — ABNORMAL HIGH (ref 70–99)
Potassium: 5.3 mmol/L — ABNORMAL HIGH (ref 3.5–5.1)
Sodium: 141 mmol/L (ref 135–145)

## 2019-06-02 LAB — IRON AND TIBC
Iron: 35 ug/dL — ABNORMAL LOW (ref 45–182)
Saturation Ratios: 16 % — ABNORMAL LOW (ref 17.9–39.5)
TIBC: 214 ug/dL — ABNORMAL LOW (ref 250–450)
UIBC: 179 ug/dL

## 2019-06-02 LAB — FERRITIN: Ferritin: 826 ng/mL — ABNORMAL HIGH (ref 24–336)

## 2019-06-02 LAB — CREATININE, URINE, RANDOM: Creatinine, Urine: 144.76 mg/dL

## 2019-06-02 MED ORDER — LIDOCAINE HCL (PF) 1 % IJ SOLN
INTRAMUSCULAR | Status: AC
  Filled 2019-06-02: qty 30

## 2019-06-02 NOTE — Procedures (Signed)
Ultrasound-guided diagnostic and therapeutic right thoracentesis performed yielding 1.4 liters of blood-tinged fluid. No immediate complications. Follow-up chest x-ray pending. The fluid was sent to the lab for preordered studies. EBL none. Due to hypotension only the above amount of fluid was removed today.

## 2019-06-02 NOTE — Progress Notes (Signed)
Physical Therapy Session Note  Patient Details  Name: David Barton MRN: [id number] Date of Birth: 10/12/1933  Today's Date: 06/02/2019 PT Individual Time: 0805-0900 PT Individual Time Calculation (min): 55 min   Short Term Goals: Week 1:  PT Short Term Goal 1 (Week 1): = to LTGs based on ELOS  Skilled Therapeutic Interventions/Progress Updates:    Pt supine in bed upon PT arrival, agreeable to therapy tx and denies pain. Pt transferred from supine>sitting EOB with min assist, pt seated EOB this session with supervision for sitting balance while eating breakfast x 5 minutes. Pt donned pants, sit<>Stand with supervision to pull over hips. Pt maintained standing balance with supervision while using urinal, continent of bladder. Pt ambulated in the unit x 60 ft with CGA without AD while on 4L O2/min. Pt reports feeling more SOB breath today, SpO2 80% following short bout of ambulation. Therapist discussed low SpO2 with RN and recommended adjusting oxygen to 6L O2/min. After therapeutic seated rest break and cues for breathing techniques, pts SpO2 89% while on 6L O2/min. Pt used nustep this session working on aerobic endurance to perform 3 bouts of 2 min at resistance level 5, between each bout checking SpO2 with it remaining between 86-89%. Pt worked on LE strength and endurance to perform 2 x 10 LAQ and seated marches. Pt ambulated x 60 ft back to room with CGA, transferred to recliner and left with needs in reach and chair alarm set. SpO2 88% while on 6L O2/min.   Therapy Documentation Precautions:  Precautions Precautions: Fall, Other (comment) Precaution Comments: monitor SpO2 (4L-oxygen dependent baseline) Restrictions Weight Bearing Restrictions: No    Therapy/Group: Individual Therapy  Netta Corrigan, PT, DPT 06/02/2019, 7:48 AM

## 2019-06-02 NOTE — Progress Notes (Signed)
[facility] PHYSICAL MEDICINE & REHABILITATION PROGRESS NOTE   Subjective/Complaints: Patient denies complaints.  He feels well.  He denies shortness of breath.  Objective:     Physical Exam: Vital Signs Blood pressure 109/61, pulse 68, temperature (!) 97.5 F (36.4 C), temperature source Oral, resp. rate 16, height 5\' 10"  (1.778 m), weight 102.7 kg, SpO2 91 %.  Overweight male in no acute distress.  He is wearing oxygen.  HEENT exam atraumatic, normocephalic, extraocular muscles are intact.  Neck is supple.  Chest clear to auscultation with decreased breath sounds at the right base.  Cardiac exam S1 and S2 are difficult to hear Abdominal exam overweight, active bowel sounds, soft.  Extremities without significant edema.   Assessment/Plan: 1. Functional deficits secondary to debility   Medical Problem List and Plan: 1.   Debility secondary to pneumonia causing acute exacerbation of chronic respiratory failure Lab Results  Component Value Date   CREATININE 5.00 (H) 06/02/2019   2. Antithrombotics: -DVT/anticoagulation:Pharmaceutical:Other (comment)--Elquis -antiplatelet therapy: N/A 3. Pain Management:N/A 4. Mood:LCSW to follow for evaluation and support. -antipsychotic agents: N/A 5. Neuropsych: This patientiscapable of making decisions on hisown behalf. 6. Skin/Wound Care:Routine pressure relief measures. 7. Fluids/Electrolytes/Nutrition:strict I/O. Check lytes in am. 8. COPD with chronic hypoxic failure:Pulmicort bid with 4liters oxygenduring the day and VM- 8L at nights. Wean to least amount needed to keep saturations 89-92%. Will need PCCM to evaluate oxygen needs/set sleep study prior to discharge. . Will order prn albuterol as well 9. Aspiration PNA:Has completed a week course ofAugmentin.Afebrile without cough  10: Afib/Aflutter:failed dccv  11. Acute on Chronic systolic:On lasix 40 mg bid and Zebeta.Monitor daily  weights as well as strict I/O.Marland Kitchen  Per Dr. Doyne Keel, pleural effusion is due to CHF 12. CKD stage PT:WSFKCLEX by Dr. Moshe Cipro.Has refused HD creat up to 4.5, appreciate nephrology follow-up.  Creatinine stable on 6/12 Lab Results  Component Value Date   CREATININE 5.00 (H) 06/02/2019  Followed by nephrology.  Appreciate nephrology note.  It appears that patient does not want hemodialysis.  Marland Kitchen13. Sick Euthyroid?: Will need repeat TSH in 3-4 weeks    LOS: 3 days A FACE TO FACE EVALUATION WAS PERFORMED  Ilyaas Musto H Sundeep Cary 06/02/2019, 9:58 AM

## 2019-06-02 NOTE — Progress Notes (Signed)
Occupational Therapy Note  Patient Details  Name: David Barton MRN: [id number] Date of Birth: January 07, 1933  Today's Date: 06/02/2019 OT Missed Time: 75 Minutes Missed Time Reason: Patient fatigue  Pt missed 75 mins scheduled OT treatment session secondary to just returning from renal ultrasound and extremely fatigued from events of the day.  Pt asleep upon arrival and required increased time to fully arouse.  Pt declined therapy at this time due to above and expressing desire to hold lunch and therapy at this time.  Will continue to follow.  [name] 06/02/2019, 3:01 PM

## 2019-06-02 NOTE — Progress Notes (Addendum)
[facility] Progress Note    Assessment/ Plan:   85 y.o. male COPD sCHF EF 30% in 2017 which subsequently normalized (currently 45-50%) , Afib w/ TEE CV, HTN CKD IV, aocohol abuse , colon cancer who recently presented w/ CP and found to have a new right sided consolidation with moderate pleural effusion s/p thoracentesis 05/21/2019 (1.85L) thought to be a parapneumonic exudate and completed Abx course. He was COVID19 neg x2. BL creatinine is ~2.5 but it has fluctuated with diuretics; transferred to CIR. He was down to 101.5kg 05/26/19.   1. 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  - Stopped the Fleet enema; high risk for causing AKI and fortunately pt did not receive any.  - Floor scale weights only; 101.5kg 05/26/19. - Lasix 80mg  IV BID x1 day (has received 2 doses) and will evaluate response -> Poor UOP and worsening renal function. Hold diuretics and will monitor closely. Fortunately dyspnea is no worse. S/p rt  Thoracentesis 6/13 with 1.4L blood tinged fluid.  Checking PVR bladder scan and renal u/s to r/o obstruction Stopped the Lasix; let's let him equilibrate over the next 24-48hrs.  Will also check urine micro.  2. CHF with echo 6/1 EF 45-50% - has been on Lasix 40 mg BID but then converted to 40mg  IV q12hrs (has only rx 2 dose) -> 80mg  BID with poor response 3. Aflutter - failed TEE DCCV x2 03/16/19 4. Acute on chronic hypoxic respiratory failure 5. Anemia  - Will check an iron panel for now; likely will need ESA.  Subjective:   Dyspnea no worse. Denies n/v/cp.  No f/c.   Objective:   BP (!) 90/48 (BP Location: Left Arm)   Pulse 68   Temp (!) 97.5 F (36.4 C) (Oral)   Resp 16   Ht 5\' 10"  (1.778 m)   Wt 102.7 kg   SpO2 91%   BMI 32.50 kg/m   Intake/Output Summary (Last 24 hours) at 06/02/2019 1126 Last data filed at 06/02/2019 0853 Gross per 24 hour  Intake 600 ml   Output 325 ml  Net 275 ml   Weight change:   Physical Exam: GEN: NAD, A&Ox3, NCAT HEENT: No conjunctival pallor, EOMI NECK: Supple, no thyromegaly LUNGS: CTA B/L no rales, rhonchi or wheezing CV: RRR, No M/R/G ABD: SNDNT +BS  EXT: No lower extremity pitting edema    Imaging: Dg Chest 2 View  Result Date: 06/01/2019 CLINICAL DATA:  Shortness of breath and weakness. EXAM: CHEST - 2 VIEW COMPARISON:  May 31, 2019 FINDINGS: Effusion and opacity in the right base is stable. A small effusion with underlying atelectasis in the left base is stable. The cardiomediastinal silhouette is unchanged. No pneumothorax. No other changes. IMPRESSION: Right greater than left pleural effusions with underlying atelectasis. No other interval changes or acute abnormalities. Electronically Signed   By: Dorise Bullion III M.D   On: 06/01/2019 12:20   Dg Chest 2 View  Result Date: 05/31/2019 CLINICAL DATA:  Lung infiltrate EXAM: CHEST - 2 VIEW COMPARISON:  Six [place] eleven 2020, 05/23/2019, CT 05/20/2019 FINDINGS: Small left pleural effusion and moderate right pleural effusion without significant change. Persistent consolidation at the right middle lobe and right greater than left lung base. Enlarged cardiomediastinal silhouette with central congestion. Aortic atherosclerosis. No pneumothorax. IMPRESSION: No significant interval change in moderate right and small left pleural effusion. Continued airspace consolidation at the  right middle lobe and right greater than left lung base. Electronically Signed   By: Donavan Foil M.D.   On: 05/31/2019 19:16   Dg Chest 2 View  Result Date: 05/31/2019 CLINICAL DATA:  Hypoxia EXAM: CHEST - 2 VIEW COMPARISON:  05/23/2019 FINDINGS: Cardiac shadow is enlarged. Enlarging right-sided pleural effusion is noted with likely underlying atelectasis/infiltrate. Small left effusion is noted. No other focal abnormality is noted. IMPRESSION: Bilateral pleural effusions right greater  than left increased when compared with the prior exam particularly on the right. Electronically Signed   By: Inez Catalina M.D.   On: 05/31/2019 12:34    Labs: BMET Recent Labs  Lab 05/27/19 0645 05/31/19 0346 06/01/19 0334 06/02/19 0411  NA 144 142 140 141  K 4.3 5.6* 5.1 5.3*  CL 103 101 102 99  CO2 31 31 26 29   GLUCOSE 96 120* 120* 105*  BUN 61* 82* 93* 101*  CREATININE 3.12* 4.50* 4.56* 5.00*  CALCIUM 9.5 8.9 8.9 8.8*   CBC Recent Labs  Lab 05/31/19 0346  WBC 11.7*  NEUTROABS 8.9*  HGB 9.3*  HCT 31.8*  MCV 109.7*  PLT 262    Medications:    . allopurinol  100 mg Oral Daily  . amiodarone  200 mg Oral Daily  . apixaban  2.5 mg Oral BID  . bisacodyl  10 mg Oral Daily  . bisoprolol  5 mg Oral Daily  . budesonide (PULMICORT) nebulizer solution  0.25 mg Nebulization BID  . ferrous sulfate  325 mg Oral Daily  . furosemide  40 mg Intravenous Q12H  . hydrocerin   Topical BID  . ipratropium-albuterol  3 mL Nebulization BID  . lidocaine (PF)          Otelia Santee, MD 06/02/2019, 11:26 AM

## 2019-06-02 NOTE — Progress Notes (Signed)
Occupational Therapy Note  Patient Details  Name: David Barton MRN: [id number] Date of Birth: 06-Mar-1933  Today's Date: 06/02/2019 OT Missed Time: 38 Minutes Missed Time Reason: (procedure)  Pt missed 60 mins scheduled OT therapy session due to off unit for thoracentesis.  Will continue to follow as pt availability allows.  Will attempt to see in PM.   English [name], [facility] 06/02/2019, 11:55 AM

## 2019-06-03 LAB — BASIC METABOLIC PANEL
Anion gap: 13 (ref 5–15)
BUN: 110 mg/dL — ABNORMAL HIGH (ref 8–23)
CO2: 27 mmol/L (ref 22–32)
Calcium: 8.9 mg/dL (ref 8.9–10.3)
Chloride: 100 mmol/L (ref 98–111)
Creatinine, Ser: 5.07 mg/dL — ABNORMAL HIGH (ref 0.61–1.24)
GFR calc Af Amer: 11 mL/min — ABNORMAL LOW (ref >60)
GFR calc non Af Amer: 10 mL/min — ABNORMAL LOW (ref >60)
Glucose, Bld: 113 mg/dL — ABNORMAL HIGH (ref 70–99)
Potassium: 5.1 mmol/L (ref 3.5–5.1)
Sodium: 140 mmol/L (ref 135–145)

## 2019-06-03 LAB — URINALYSIS, ROUTINE W REFLEX MICROSCOPIC
Bacteria, UA: NONE SEEN
Bilirubin Urine: NEGATIVE
Glucose, UA: NEGATIVE mg/dL
Hgb urine dipstick: NEGATIVE
Ketones, ur: NEGATIVE mg/dL
Leukocytes,Ua: NEGATIVE
Nitrite: NEGATIVE
Protein, ur: 30 mg/dL — AB
Specific Gravity, Urine: 1.014 (ref 1.005–1.030)
pH: 5 (ref 5.0–8.0)

## 2019-06-03 LAB — CREATININE, URINE, RANDOM: Creatinine, Urine: 174.33 mg/dL

## 2019-06-03 LAB — GRAM STAIN

## 2019-06-03 LAB — SODIUM, URINE, RANDOM: Sodium, Ur: <10 mmol/L

## 2019-06-03 MED ORDER — SODIUM CHLORIDE 0.9 % IV SOLN
510.0000 mg | Freq: Once | INTRAVENOUS | Status: AC
  Administered 2019-06-03: 510 mg via INTRAVENOUS
  Filled 2019-06-03: qty 17

## 2019-06-03 NOTE — Progress Notes (Signed)
[facility] PHYSICAL MEDICINE & REHABILITATION PROGRESS NOTE   Subjective/Complaints: Patient feels well.  He has no complaints.  He really cannot tell a difference in his breathing after thoracentesis.  Appreciate interventional radiology help with thoracentesis.  Appreciate nephrology follow-up. Objective:   Physical Exam: Vital Signs Blood pressure 109/72, pulse 68, temperature (!) 97.4 F (36.3 C), temperature source Oral, resp. rate 20, height 5\' 10"  (1.778 m), weight 103.7 kg, SpO2 90 %.  Elderly, chronically ill appearing male in no acute distress. HEENT exam atraumatic, normocephalic, neck supple without jugular venous distention. Chest clear to auscultation cardiac exam S1-S2 are regular. Abdominal exam overweight with bowel sounds, soft and nontender. Extremities no edema. Neurologic exam is alert   Assessment/Plan: 1. Functional deficits secondary to debility   Medical Problem List and Plan: 1.   Debility secondary to pneumonia causing acute exacerbation of chronic respiratory failure Lab Results  Component Value Date   CREATININE 5.07 (H) 06/03/2019   2. Antithrombotics: -DVT/anticoagulation:Pharmaceutical:Other (comment)--Elquis -antiplatelet therapy: N/A 3. Pain Management:N/A 4. Mood:LCSW to follow for evaluation and support. -antipsychotic agents: N/A 5. Neuropsych: This patientiscapable of making decisions on hisown behalf. 6. Skin/Wound Care:Routine pressure relief measures. 7. Fluids/Electrolytes/Nutrition:strict I/O.  BMP Latest Ref Rng & Units 06/03/2019 06/02/2019 06/01/2019  Glucose 70 - 99 mg/dL 113(H) 105(H) 120(H)  BUN 8 - 23 mg/dL 110(H) 101(H) 93(H)  Creatinine 0.61 - 1.24 mg/dL 5.07(H) 5.00(H) 4.56(H)  BUN/Creat Ratio 10 - 24 - - -  Sodium 135 - 145 mmol/L 140 141 140  Potassium 3.5 - 5.1 mmol/L 5.1 5.3(H) 5.1  Chloride 98 - 111 mmol/L 100 99 102  CO2 22 - 32 mmol/L 27 29 26   Calcium 8.9 - 10.3 mg/dL 8.9 8.8(L)  8.9   Will check bmet in a.m. Will need to closely follow renal failure 8. COPD with chronic hypoxic failure:Pulmicort bid with 4liters oxygenduring the day and VM- 8L at nights. Wean to least amount needed to keep saturations 89-92%. Will need PCCM to evaluate oxygen needs/set sleep study prior to discharge. . Will order prn albuterol as well 9. Aspiration PNA:Has completed a week course ofAugmentin.Afebrile without cough  10: Afib/Aflutter:failed dccv  11. Acute on Chronic systolic:On lasix 40 mg bid and Zebeta.Monitor daily weights as well as strict I/O.Marland Kitchen  Thoracentesis  yeaterday 12. CKD stage CV:ELFYBOFB by Dr. Moshe Cipro.Has refused HD creat up to 4.5, appreciate nephrology follow-up.  Creatinine stable on 6/12 Lab Results  Component Value Date   CREATININE 5.07 (H) 06/03/2019  Followed by nephrology.  Appreciate nephrology note.  It appears that patient does not want hemodialysis.  Marland Kitchen13. Sick Euthyroid?: Will need repeat TSH in 3-4 weeks   Lab Results  Component Value Date   TSH 4.099 05/19/2019     LOS: 4 days A FACE TO FACE EVALUATION WAS PERFORMED  David Barton 06/03/2019, 7:20 AM

## 2019-06-03 NOTE — Progress Notes (Signed)
[facility] Progress Note    Assessment/ Plan:   85 y.o.maleCOPD sCHF EF 30% in 2017 which subsequently normalized (currently 45-50%) , Afib w/ TEE CV, HTN CKD IV, aocohol abuse , colon cancer who recently presented w/ CP and found to have a new right sided consolidation with moderate pleural effusion s/p thoracentesis 05/21/2019 (1.85L) thought to be a parapneumonic exudate and completed Abx course. He was COVID19 neg x2. BL creatinine is ~2.5 but it has fluctuated with diuretics; transferred to CIR. He was down to 101.5kg 05/26/19.6/13 repeat thoracentesis with 1.2L  1. AKI on advanced CKD IV w/ progressive renal disease and the baseline appears to be in the 2.8-3.2 range- he does not want dialysis. - Stopped the Fleet enema; high risk for causing AKI and fortunately pt did not receive any.  - Floor scale weights only; 101.5kg 05/26/19 -> weights incr (103.7kg today) but it is a bed scale weight.  Bladder scan (under 63ml); renal u/s neg for obstruction  Will send for urine micro and FeNA w/ oliguric status; pt does not have any urine for me to study this AM. Wonder if he could be in ischemic ATN secondary to the recent infection and fluctuating hemodynamics.   If FeNA is low I will try gentle fluids as he appears to be clinically dry even though his weights are higher (only 540ml); if no response then will treat as cardiorenal with high dose diuretics 120mg  IV q8hr.  2. CHF with echo 6/1 EF 45-50%- has been on Lasix 40 mg BID but then converted to 40mg  IV q12hrs (has only rx 2 dose) -> 80mg  BID with poor response (last dose 6/13) 3. Aflutter - failed TEE DCCV x2 03/16/19 4. Acute on chronic hypoxic respiratory failure 5. Anemia - Iron panel c/w iron deficiency -> will replete with Feraheme; will need ESA after repletion  Subjective:   Denies any worsening of dyspnea. Denies f/c/n/v. +faitue   Objective:   BP 109/72 (BP Location: Right Arm)   Pulse 68   Temp (!)  97.4 F (36.3 C) (Oral)   Resp 20   Ht 5\' 10"  (1.778 m)   Wt 103.7 kg   SpO2 94%   BMI 32.80 kg/m   Intake/Output Summary (Last 24 hours) at 06/03/2019 0953 Last data filed at 06/03/2019 0845 Gross per 24 hour  Intake 297 ml  Output 352 ml  Net -55 ml   Weight change:   Physical Exam: GEN: NAD, A&Ox3, NCAT HEENT: No conjunctival pallor, EOMI NECK: Supple, no thyromegaly LUNGS: CTA B/L no rales, rhonchi CV: RRR ABD: SNDNT +BS  EXT: Tr lower extremity pitting edema  Imaging: Dg Chest 1 View  Result Date: 06/02/2019 CLINICAL DATA:  Pleural effusion. Status post thoracentesis. EXAM: CHEST  1 VIEW COMPARISON:  06/01/2019 FINDINGS: Small right pleural effusion shows decrease in size since previous study. No pneumothorax visualized. Atelectasis or infiltrate is again seen in both lung bases. Mild scarring is also noted in the lower lung zones bilaterally. Stable cardiomegaly. IMPRESSION: 1. Decreased size of small right pleural effusion. No pneumothorax visualized. 2. Bibasilar atelectasis versus infiltrates, without significant change. 3. Stable cardiomegaly. Electronically Signed   By: Earle Gell M.D.   On: 06/02/2019 11:54   Dg Chest 2 View  Result Date: 06/01/2019 CLINICAL DATA:  Shortness of breath and weakness. EXAM: CHEST - 2 VIEW COMPARISON:  May 31, 2019 FINDINGS: Effusion and opacity in the right base is stable. A small effusion with underlying atelectasis in the left  base is stable. The cardiomediastinal silhouette is unchanged. No pneumothorax. No other changes. IMPRESSION: Right greater than left pleural effusions with underlying atelectasis. No other interval changes or acute abnormalities. Electronically Signed   By: Dorise Bullion III M.D   On: 06/01/2019 12:20   US Renal  Result Date: 06/02/2019 CLINICAL DATA:  Acute kidney injury EXAM: RENAL / URINARY TRACT ULTRASOUND COMPLETE COMPARISON:  03/04/2019 FINDINGS: Right Kidney: Renal measurements: 11.3 x 6.1 x 6.1 cm =  volume: 216 mL. Increased renal echogenicity. No hydronephrosis. Upper pole hypoechoic cortical complex cyst measures 1.4 cm. Left Kidney: Renal measurements: 9.6 x 6.3 x 6.3 cm = volume: 199 mL. Increased renal echogenicity. No hydronephrosis. No acute finding by ultrasound. Bladder: Diffuse bladder wall thickening, this may be related to underdistention. IMPRESSION: Increased renal echogenicity compatible with medical renal disease. Suspect small minimally complex right renal cortical cyst measuring 1.4 cm Nonspecific bladder wall thickening Electronically Signed   By: Jerilynn Mages.  Shick M.D.   On: 06/02/2019 14:30   US Thoracentesis Asp Pleural Space W/img Guide  Result Date: 06/02/2019 INDICATION: Patient with history of colon cancer, COPD, CHF, chronic kidney disease, dyspnea, recurrent right pleural effusion. Request made for diagnostic and therapeutic right thoracentesis. EXAM: ULTRASOUND GUIDED DIAGNOSTIC AND THERAPEUTIC RIGHT THORACENTESIS MEDICATIONS: None COMPLICATIONS: None immediate. PROCEDURE: An ultrasound guided thoracentesis was thoroughly discussed with the patient and questions answered. The benefits, risks, alternatives and complications were also discussed. The patient understands and wishes to proceed with the procedure. Written consent was obtained. Ultrasound was performed to localize and mark an adequate pocket of fluid in the right chest. The area was then prepped and draped in the normal sterile fashion. 1% Lidocaine was used for local anesthesia. Under ultrasound guidance a 6 Fr Safe-T-Centesis catheter was introduced. Thoracentesis was performed. The catheter was removed and a dressing applied. FINDINGS: A total of approximately 1.4 liters of blood-tinged fluid was removed. Samples were sent to the laboratory as requested by the clinical team. Due to patient hypotension only the above amount of fluid was removed today. IMPRESSION: Successful ultrasound guided diagnostic and therapeutic right  thoracentesis yielding 1.4 liters of pleural fluid. Read by: Rowe Robert, PA-C Electronically Signed   By: Jerilynn Mages.  Shick M.D.   On: 06/02/2019 10:46    Labs: BMET Recent Labs  Lab 05/31/19 0346 06/01/19 0334 06/02/19 0411 06/03/19 0546  NA 142 140 141 140  K 5.6* 5.1 5.3* 5.1  CL 101 102 99 100  CO2 31 26 29 27   GLUCOSE 120* 120* 105* 113*  BUN 82* 93* 101* 110*  CREATININE 4.50* 4.56* 5.00* 5.07*  CALCIUM 8.9 8.9 8.8* 8.9   CBC Recent Labs  Lab 05/31/19 0346  WBC 11.7*  NEUTROABS 8.9*  HGB 9.3*  HCT 31.8*  MCV 109.7*  PLT 262    Medications:    . allopurinol  100 mg Oral Daily  . amiodarone  200 mg Oral Daily  . apixaban  2.5 mg Oral BID  . bisacodyl  10 mg Oral Daily  . bisoprolol  5 mg Oral Daily  . budesonide (PULMICORT) nebulizer solution  0.25 mg Nebulization BID  . ferrous sulfate  325 mg Oral Daily  . hydrocerin   Topical BID  . ipratropium-albuterol  3 mL Nebulization BID      Otelia Santee, MD 06/03/2019, 9:53 AM

## 2019-06-04 ENCOUNTER — Inpatient Hospital Stay (HOSPITAL_COMMUNITY): Payer: Medicare Other | Admitting: Physical Therapy

## 2019-06-04 ENCOUNTER — Inpatient Hospital Stay (HOSPITAL_COMMUNITY): Payer: Medicare Other | Admitting: Occupational Therapy

## 2019-06-04 ENCOUNTER — Inpatient Hospital Stay (HOSPITAL_COMMUNITY): Payer: Medicare Other

## 2019-06-04 DIAGNOSIS — N186 End stage renal disease: Secondary | ICD-10-CM

## 2019-06-04 DIAGNOSIS — N184 Chronic kidney disease, stage 4 (severe): Secondary | ICD-10-CM

## 2019-06-04 DIAGNOSIS — J449 Chronic obstructive pulmonary disease, unspecified: Secondary | ICD-10-CM

## 2019-06-04 DIAGNOSIS — E875 Hyperkalemia: Secondary | ICD-10-CM

## 2019-06-04 DIAGNOSIS — D72829 Elevated white blood cell count, unspecified: Secondary | ICD-10-CM

## 2019-06-04 DIAGNOSIS — R0902 Hypoxemia: Secondary | ICD-10-CM

## 2019-06-04 DIAGNOSIS — N179 Acute kidney failure, unspecified: Secondary | ICD-10-CM

## 2019-06-04 DIAGNOSIS — R0602 Shortness of breath: Secondary | ICD-10-CM

## 2019-06-04 DIAGNOSIS — Z9889 Other specified postprocedural states: Secondary | ICD-10-CM

## 2019-06-04 DIAGNOSIS — I5043 Acute on chronic combined systolic (congestive) and diastolic (congestive) heart failure: Secondary | ICD-10-CM

## 2019-06-04 LAB — RENAL FUNCTION PANEL
Albumin: 2.6 g/dL — ABNORMAL LOW (ref 3.5–5.0)
Anion gap: 13 (ref 5–15)
BUN: 120 mg/dL — ABNORMAL HIGH (ref 8–23)
CO2: 27 mmol/L (ref 22–32)
Calcium: 8.9 mg/dL (ref 8.9–10.3)
Chloride: 98 mmol/L (ref 98–111)
Creatinine, Ser: 5.79 mg/dL — ABNORMAL HIGH (ref 0.61–1.24)
GFR calc Af Amer: 10 mL/min — ABNORMAL LOW (ref >60)
GFR calc non Af Amer: 8 mL/min — ABNORMAL LOW (ref >60)
Glucose, Bld: 126 mg/dL — ABNORMAL HIGH (ref 70–99)
Phosphorus: 7 mg/dL — ABNORMAL HIGH (ref 2.5–4.6)
Potassium: 5.9 mmol/L — ABNORMAL HIGH (ref 3.5–5.1)
Sodium: 138 mmol/L (ref 135–145)

## 2019-06-04 LAB — BASIC METABOLIC PANEL
Anion gap: 14 (ref 5–15)
BUN: 117 mg/dL — ABNORMAL HIGH (ref 8–23)
CO2: 25 mmol/L (ref 22–32)
Calcium: 8.8 mg/dL — ABNORMAL LOW (ref 8.9–10.3)
Chloride: 100 mmol/L (ref 98–111)
Creatinine, Ser: 5.61 mg/dL — ABNORMAL HIGH (ref 0.61–1.24)
GFR calc Af Amer: 10 mL/min — ABNORMAL LOW (ref >60)
GFR calc non Af Amer: 9 mL/min — ABNORMAL LOW (ref >60)
Glucose, Bld: 106 mg/dL — ABNORMAL HIGH (ref 70–99)
Potassium: 5.4 mmol/L — ABNORMAL HIGH (ref 3.5–5.1)
Sodium: 139 mmol/L (ref 135–145)

## 2019-06-04 LAB — GLUCOSE, CAPILLARY
Glucose-Capillary: 109 mg/dL — ABNORMAL HIGH (ref 70–99)
Glucose-Capillary: 132 mg/dL — ABNORMAL HIGH (ref 70–99)

## 2019-06-04 LAB — UREA NITROGEN, URINE: Urea Nitrogen, Ur: 425 mg/dL

## 2019-06-04 MED ORDER — FUROSEMIDE 40 MG PO TABS
160.0000 mg | ORAL_TABLET | Freq: Two times a day (BID) | ORAL | Status: DC
  Administered 2019-06-05 – 2019-06-07 (×4): 160 mg via ORAL
  Filled 2019-06-04 (×4): qty 4

## 2019-06-04 MED ORDER — FUROSEMIDE 40 MG PO TABS
160.0000 mg | ORAL_TABLET | Freq: Two times a day (BID) | ORAL | Status: DC
  Administered 2019-06-04: 13:00:00 160 mg via ORAL
  Filled 2019-06-04: qty 4

## 2019-06-04 MED ORDER — FUROSEMIDE 40 MG PO TABS: 160.0000 mg | ORAL_TABLET | Freq: Two times a day (BID) | ORAL | Status: DC

## 2019-06-04 NOTE — Progress Notes (Signed)
Physical Therapy Session Note  Patient Details  Name: David Barton MRN: [id number] Date of Birth: 1933-01-27  Pt missed 30 minutes skilled PT due to just returning from being off unit for x ray and too fatigued to participate       Therapy Documentation Precautions:  Precautions Precautions: Fall, Other (comment) Precaution Comments: monitor SpO2 (4L-oxygen dependent baseline) Restrictions Weight Bearing Restrictions: No General: PT Amount of Missed Time (min): 30 Minutes PT Missed Treatment Reason: Patient fatigue    Therapy/Group: Individual Therapy  DONAWERTH,KAREN 06/04/2019, 12:47 PM

## 2019-06-04 NOTE — Progress Notes (Addendum)
Occupational Therapy Session Note  Patient Details  Name: David Barton MRN: [id number] Date of Birth: 1933/08/11  Today's Date: 06/04/2019 OT Individual Time: 0737-1062 OT Individual Time Calculation (min): 25 min  and Today's Date: 06/04/2019 OT Missed Time: 35 Minutes Missed Time Reason: Other (comment)(labored breathing at rest)   Short Term Goals: Week 1:  OT Short Term Goal 1 (Week 1): STG = LTG due to length of stay  Skilled Therapeutic Interventions/Progress Updates:    Limited treatment session due to decreased endurance and pt reporting feeling "awful" do to labored breathing.  O2 sats 90% on 6L O2 seated.  Pt reports extremely worn out from transferring just from bed to recliner and feeling it may be "too soon" in the recovery period as he feels worse today than last week.  MD arrived during session and reports plan to communicate with pulmonary MD.  Pt questioning if kidney issues may be impacting his breathing.  Engaged in prolonged discussion regarding medical issues and pt goals regarding fulfillment of life.  Deferred therapeutic activity at this time and pt hopeful for more answers regarding his lung/breathing status.  Therapy Documentation Precautions:  Precautions Precautions: Fall, Other (comment) Precaution Comments: monitor SpO2 (4L-oxygen dependent baseline) Restrictions Weight Bearing Restrictions: No General: General OT Amount of Missed Time: 35 Minutes Vital Signs: Therapy Vitals Temp: 97.6 F (36.4 C) Temp Source: Oral Pulse Rate: 63 Resp: 18 BP: 114/63 Patient Position (if appropriate): Sitting Oxygen Therapy SpO2: 91 % O2 Device: Nasal Cannula O2 Flow Rate (L/min): 5 L/min Patient Activity (if Appropriate): In bed Pain: Pain Assessment Pain Scale: 0-10 Pain Score: 0-No pain   Therapy/Group: Individual Therapy  [name] 06/04/2019, 8:14 AM

## 2019-06-04 NOTE — Progress Notes (Signed)
Subjective: Interval History: has complaints knows he is no better..  Objective: Vital signs in last 24 hours: Temp:  [97.5 F (36.4 C)-98.3 F (36.8 C)] 97.6 F (36.4 C) (06/15 0600) Pulse Rate:  [62-65] 63 (06/15 0811) Resp:  [17-18] 18 (06/15 0811) BP: (102-114)/(62-68) 114/63 (06/15 0600) SpO2:  [88 %-92 %] 89 % (06/15 1031) Weight:  [102.2 kg] 102.2 kg (06/15 0714) Weight change:   Intake/Output from previous day: 06/14 0701 - 06/15 0700 In: 637 [P.O.:637] Out: 376 [Urine:375; Stool:1] Intake/Output this shift: Total I/O In: 120 [P.O.:120] Out: -   General appearance: alert, cooperative, moderately obese and pale Resp: rales bibasilar Cardio: S1, S2 normal and systolic murmur: holosystolic 2/6, blowing at apex GI: obese, pos bs, soft, Extremities: edema 2+  Lab Results: No results for input(s): WBC, HGB, HCT, PLT in the last 72 hours. BMET:  Recent Labs    06/03/19 0546 06/04/19 0616  NA 140 139  K 5.1 5.4*  CL 100 100  CO2 27 25  GLUCOSE 113* 106*  BUN 110* 117*  CREATININE 5.07* 5.61*  CALCIUM 8.9 8.8*   No results for input(s): PTH in the last 72 hours. Iron Studies:  Recent Labs    06/02/19 0411  IRON 35*  TIBC 214*  FERRITIN 826*    Studies/Results: Dg Chest 2 View  Result Date: 06/04/2019 CLINICAL DATA:  Shortness of breath and weakness. EXAM: CHEST - 2 VIEW COMPARISON:  Radiographs 06/02/2019 and 06/01/2019.  CT 05/20/2019. FINDINGS: Stable cardiac enlargement. There has been no significant change in the right-greater-than-left pleural effusions and associated bibasilar airspace opacities. There is no pneumothorax or pulmonary edema. The bones appear unchanged. IMPRESSION: Unchanged residual small pleural effusions and bibasilar airspace opacities. No new findings. Electronically Signed   By: Richardean Sale M.D.   On: 06/04/2019 11:28   US Renal  Result Date: 06/02/2019 CLINICAL DATA:  Acute kidney injury EXAM: RENAL / URINARY TRACT  ULTRASOUND COMPLETE COMPARISON:  03/04/2019 FINDINGS: Right Kidney: Renal measurements: 11.3 x 6.1 x 6.1 cm = volume: 216 mL. Increased renal echogenicity. No hydronephrosis. Upper pole hypoechoic cortical complex cyst measures 1.4 cm. Left Kidney: Renal measurements: 9.6 x 6.3 x 6.3 cm = volume: 199 mL. Increased renal echogenicity. No hydronephrosis. No acute finding by ultrasound. Bladder: Diffuse bladder wall thickening, this may be related to underdistention. IMPRESSION: Increased renal echogenicity compatible with medical renal disease. Suspect small minimally complex right renal cortical cyst measuring 1.4 cm Nonspecific bladder wall thickening Electronically Signed   By: Jerilynn Mages.  Shick M.D.   On: 06/02/2019 14:30    I have reviewed the patient's current medications.  Assessment/Plan: 1 CKD4, now mild worsening, suspect cardio renal with low FENA, and low bps, will stop bisop,and add Lasix as is dyspneic.  Not a lot to do. Does not want dialysis 2 CM 3 Anemia hold off addressing 4 COPD 5 Pleural effusons P Lasix,stop bisoprolol  LOS: 5 days   Jeneen Rinks Deyanira Fesler 06/04/2019,12:00 PM

## 2019-06-04 NOTE — Progress Notes (Signed)
Occupational Therapy Session Note  Patient Details  Name: David Barton MRN: [id number] Date of Birth: 01/31/1933  Today's Date: 06/04/2019 OT Individual Time: 1335-1435 OT Individual Time Calculation (min): 60 min    Short Term Goals: Week 1:  OT Short Term Goal 1 (Week 1): STG = LTG due to length of stay  Skilled Therapeutic Interventions/Progress Updates:    Treatment session with focus on activity tolerance and functional mobility.  Pt received upright in recliner appearing to have less difficulty with breathing at this time.  Pt reports still feeling "awful" and like he has no endurance.  Engaged again in therapeutic use of self as pt with concerns about medical prognosis.  Discussed information he had received to this point and his feelings regarding treatments.  Pt reports need to toilet.  Attempted to utilize urinal in standing, however pt reports need to have BM.  Ambulated to toilet with CGA due to instability and urgency.  Pt completed toileting with supervision.  Returned to room and left upright in recliner with all needs in reach.  Therapy Documentation Precautions:  Precautions Precautions: Fall, Other (comment) Precaution Comments: monitor SpO2 (4L-oxygen dependent baseline) Restrictions Weight Bearing Restrictions: No Pain:  Pt with no c/o pain   Therapy/Group: Individual Therapy  [name] 06/04/2019, 3:10 PM

## 2019-06-04 NOTE — Progress Notes (Signed)
Physical Therapy Session Note  Patient Details  Name: David Barton MRN: [id number] Date of Birth: May 05, 1933  Today's Date: 06/04/2019 PT Individual Time: 3582-5189 PT Individual Time Calculation (min): 45 min   Short Term Goals: Week 1:  PT Short Term Goal 1 (Week 1): = to LTGs based on ELOS  Skilled Therapeutic Interventions/Progress Updates:    pt rec'd in recliner on 6LO2. Pt states "I feel horrible, I want to see a doctor".  PT empathizes with pt and provides emotional support. Pt agreeable to "try something".  Pt performs sit <> stand and gait without AD with supervision 100' x 2. spO2 89-90% on 6LO2 after gait.  Pt performs nustep 4 x 2 minutes with frequent prolonged rest breaks due to fatigue.  Pt performs bed mobility at end of session with supervision. Pt left in bed with needs at hand, alarm set, on 6LO2.  Therapy Documentation Precautions:  Precautions Precautions: Fall, Other (comment) Precaution Comments: monitor SpO2 (4L-oxygen dependent baseline) Restrictions Weight Bearing Restrictions: No General: PT Amount of Missed Time (min): 15 Minutes PT Missed Treatment Reason: Patient fatigue Vital Signs: Therapy Vitals Pulse Rate: 63 Resp: 18 Patient Position (if appropriate): Sitting Oxygen Therapy SpO2: (!) 89 % O2 Device: Nasal Cannula O2 Flow Rate (L/min): 6 L/min Patient Activity (if Appropriate): Ambulating Pain: Pain Assessment Pain Scale: 0-10 Pain Score: 0-No pain    Therapy/Group: Individual Therapy  [name] 06/04/2019, 10:31 AM

## 2019-06-04 NOTE — Progress Notes (Signed)
[place] PHYSICAL MEDICINE & REHABILITATION PROGRESS NOTE   Subjective/Complaints: Patient seen sitting up in his chair working with therapies this AM.  He states he did not sleep well overnight or have a good weekend due to shortness of breath.  Discussed with therapies functional status as of Friday and decreased endurance as well as increased oxygen demand today.  Discussed with nursing, patient not wearing CPAP at night.  She denies improvement with thoracentesis over the weekend.  Discussed with PA.   Review of systems: + Shortness of breath.  Denies chest pain, nausea, vomiting, diarrhea.  Objective:   Dg Chest 1 View  Result Date: 06/02/2019 CLINICAL DATA:  Pleural effusion. Status post thoracentesis. EXAM: CHEST  1 VIEW COMPARISON:  06/01/2019 FINDINGS: Small right pleural effusion shows decrease in size since previous study. No pneumothorax visualized. Atelectasis or infiltrate is again seen in both lung bases. Mild scarring is also noted in the lower lung zones bilaterally. Stable cardiomegaly. IMPRESSION: 1. Decreased size of small right pleural effusion. No pneumothorax visualized. 2. Bibasilar atelectasis versus infiltrates, without significant change. 3. Stable cardiomegaly. Electronically Signed   By: Earle Gell M.D.   On: 06/02/2019 11:54   US Renal  Result Date: 06/02/2019 CLINICAL DATA:  Acute kidney injury EXAM: RENAL / URINARY TRACT ULTRASOUND COMPLETE COMPARISON:  03/04/2019 FINDINGS: Right Kidney: Renal measurements: 11.3 x 6.1 x 6.1 cm = volume: 216 mL. Increased renal echogenicity. No hydronephrosis. Upper pole hypoechoic cortical complex cyst measures 1.4 cm. Left Kidney: Renal measurements: 9.6 x 6.3 x 6.3 cm = volume: 199 mL. Increased renal echogenicity. No hydronephrosis. No acute finding by ultrasound. Bladder: Diffuse bladder wall thickening, this may be related to underdistention. IMPRESSION: Increased renal echogenicity compatible with medical renal disease.  Suspect small minimally complex right renal cortical cyst measuring 1.4 cm Nonspecific bladder wall thickening Electronically Signed   By: Jerilynn Mages.  Shick M.D.   On: 06/02/2019 14:30   US Thoracentesis Asp Pleural Space W/img Guide  Result Date: 06/02/2019 INDICATION: Patient with history of colon cancer, COPD, CHF, chronic kidney disease, dyspnea, recurrent right pleural effusion. Request made for diagnostic and therapeutic right thoracentesis. EXAM: ULTRASOUND GUIDED DIAGNOSTIC AND THERAPEUTIC RIGHT THORACENTESIS MEDICATIONS: None COMPLICATIONS: None immediate. PROCEDURE: An ultrasound guided thoracentesis was thoroughly discussed with the patient and questions answered. The benefits, risks, alternatives and complications were also discussed. The patient understands and wishes to proceed with the procedure. Written consent was obtained. Ultrasound was performed to localize and mark an adequate pocket of fluid in the right chest. The area was then prepped and draped in the normal sterile fashion. 1% Lidocaine was used for local anesthesia. Under ultrasound guidance a 6 Fr Safe-T-Centesis catheter was introduced. Thoracentesis was performed. The catheter was removed and a dressing applied. FINDINGS: A total of approximately 1.4 liters of blood-tinged fluid was removed. Samples were sent to the laboratory as requested by the clinical team. Due to patient hypotension only the above amount of fluid was removed today. IMPRESSION: Successful ultrasound guided diagnostic and therapeutic right thoracentesis yielding 1.4 liters of pleural fluid. Read by: Rowe Robert, PA-C Electronically Signed   By: Jerilynn Mages.  Shick M.D.   On: 06/02/2019 10:46   No results for input(s): WBC, HGB, HCT, PLT in the last 72 hours. Recent Labs    06/03/19 0546 06/04/19 0616  NA 140 139  K 5.1 5.4*  CL 100 100  CO2 27 25  GLUCOSE 113* 106*  BUN 110* 117*  CREATININE 5.07* 5.61*  CALCIUM 8.9  8.8*    Intake/Output Summary (Last 24 hours)  at 06/04/2019 1022 Last data filed at 06/04/2019 0700 Gross per 24 hour  Intake 397 ml  Output 275 ml  Net 122 ml     Physical Exam: Vital Signs Blood pressure 114/63, pulse 63, temperature 97.6 F (36.4 C), temperature source Oral, resp. rate 18, height 5\' 10"  (1.778 m), weight 102.2 kg, SpO2 91 %. Constitutional: No distress . Vital signs reviewed. HENT: Normocephalic.  Atraumatic. Eyes: EOMI. No discharge. Cardiovascular: No JVD. Respiratory: Increased WOB.  + [place]. GI: Non-distended. Musc: No edema or tenderness in extremities. Neurologic: Alert Motor: 4+/5 throughout Skin: Warm and dry.  Intact.   Assessment/Plan: 1. Functional deficits secondary to debility which require 3+ hours per day of interdisciplinary therapy in a comprehensive inpatient rehab setting.  Physiatrist is providing close team supervision and 24 hour management of active medical problems listed below.  Physiatrist and rehab team continue to assess barriers to discharge/monitor patient progress toward functional and medical goals  Care Tool:  Bathing    Body parts bathed by patient: Right arm, Left arm, Chest, Abdomen, Front perineal area, Buttocks, Right upper leg, Left upper leg, Face(Pt reports that he occassionally washes his feet but he did not during eval)         Bathing assist Assist Level: Supervision/Verbal cueing     Upper Body Dressing/Undressing Upper body dressing   What is the patient wearing?: Pull over shirt    Upper body assist Assist Level: Set up assist    Lower Body Dressing/Undressing Lower body dressing      What is the patient wearing?: Pants, Underwear/pull up     Lower body assist Assist for lower body dressing: Set up assist     Toileting Toileting    Toileting assist Assist for toileting: Independent with assistive device     Transfers Chair/bed transfer  Transfers assist     Chair/bed transfer assist level: Contact Guard/Touching assist      Locomotion Ambulation   Ambulation assist      Assist level: Contact Guard/Touching assist Assistive device: No Device Max distance: 31ft   Walk 10 feet activity   Assist     Assist level: Contact Guard/Touching assist Assistive device: No Device   Walk 50 feet activity   Assist    Assist level: Contact Guard/Touching assist Assistive device: No Device    Walk 150 feet activity   Assist Walk 150 feet activity did not occur: Safety/medical concerns         Walk 10 feet on uneven surface  activity   Assist Walk 10 feet on uneven surfaces activity did not occur: Safety/medical concerns         Wheelchair     Assist Will patient use wheelchair at discharge?: No(TBD but do not anticipate will need w/c unless for community mobility)             Wheelchair 50 feet with 2 turns activity    Assist            Wheelchair 150 feet activity     Assist          Medical Problem List and Plan: 1.   Debility secondary to pneumonia causing acute exacerbation of chronic respiratory failure  Continue CIR  Notes reviewed- shortness of breath with kidney injury, appreciate nephro recs, labs reviewed 2. Antithrombotics: -DVT/anticoagulation:Pharmaceutical:Other (comment)--Elquis -antiplatelet therapy: N/A 3. Pain Management:N/A 4. Mood:LCSW to follow for evaluation and support. -antipsychotic agents: N/A 5.  Neuropsych: This patientiscapable of making decisions on hisown behalf. 6. Skin/Wound Care:Routine pressure relief measures. 7. Fluids/Electrolytes/Nutrition:strict I/O.  8. COPD with chronic hypoxic failure:Pulmicort bid with 4liters oxygenduring the day and VM- 8L at nights. Wean to least amount needed to keep saturations 89-92%. Will need PCCM to evaluate oxygen needs/set sleep study prior to discharge. PRN albuterol  Will discuss with Pulm  Chest x-ray on 6/13 reviewed-showing  improvement  Repeat chest x-ray ordered 9. Aspiration PNA:Has completed a week course ofAugmentin.Afebrile without cough  10: Afib/Aflutter:Now back on Eliquis--for DCCVin the futureafter pulmonary status improves? 11. Acute on Chronic systolic:On Zebeta.Monitor daily weights as well as strict I/O. Filed Weights   06/01/19 0500 06/03/19 0440 06/04/19 0714  Weight: 102.7 kg 103.7 kg 102.2 kg   Stable on 6/15  Appreciate nephro recs 12. CKD stage DT:HYHOOILN by Dr. Moshe Cipro.Has refused HD creat up to 4.5, appreciate nephrology follow-up.    Renal ultrasound suggesting medical renal disease  Creatinine 5.61 on 6/15, await further nephro recs 13. Sick Euthyroid?: Will need repeat TSH in 3-4 weeks 14.  Hyperkalemia  Potassium 5.4 on 6/15, await further nephro recs 15.  Leukocytosis  WBCs 11.7 on 6/11, labs ordered for tomorrow  LOS: 5 days A FACE TO FACE EVALUATION WAS PERFORMED  Ankit Lorie Phenix 06/04/2019, 10:22 AM

## 2019-06-05 ENCOUNTER — Ambulatory Visit: Payer: Medicare Other | Admitting: Internal Medicine

## 2019-06-05 ENCOUNTER — Inpatient Hospital Stay (HOSPITAL_COMMUNITY): Payer: Medicare Other | Admitting: Occupational Therapy

## 2019-06-05 ENCOUNTER — Inpatient Hospital Stay (HOSPITAL_COMMUNITY): Payer: Medicare Other

## 2019-06-05 DIAGNOSIS — Z515 Encounter for palliative care: Secondary | ICD-10-CM

## 2019-06-05 DIAGNOSIS — Z66 Do not resuscitate: Secondary | ICD-10-CM

## 2019-06-05 LAB — GLUCOSE, CAPILLARY: Glucose-Capillary: 111 mg/dL — ABNORMAL HIGH (ref 70–99)

## 2019-06-05 MED ORDER — SODIUM ZIRCONIUM CYCLOSILICATE 10 G PO PACK
10.0000 g | PACK | Freq: Two times a day (BID) | ORAL | Status: AC
  Administered 2019-06-05 (×2): 10 g via ORAL
  Filled 2019-06-05 (×2): qty 1

## 2019-06-05 NOTE — Consult Note (Signed)
Consultation Note Date: 06/05/2019   Patient Name: David Barton  DOB: [birth year]  MRN: [id number]  Age / Sex: 85 y.o., male  PCP: Maury Dus, MD Referring Physician: Jamse Arn, MD  Reason for Consultation: Establishing goals of care and Psychosocial/spiritual support  HPI/Patient Profile: 85 y.o. male  admitted on 05/30/2019 with past medical   history of chronic hypoxic respiratory failure- 4L oxygen dependent, COPD,h/o alcohol abuse,CKD IV--reluctant to discuss dialysis in march/20202 .  Recent admission for septic shock with renal failuredue to CAP which was complicated by onset of A fib and failed DCCV.   He wasreadmitted on 05/19/19 with recurrent acute on chronic respiratory failure due to recurrent PNA withmarked worsening ofRLL collapse with right pleural effusionand new small pericardial effusion.He was placed on BIPAP, IV antibiotics and gentle diuresis.   He refused BIPAP required 15 L high flow oxygen. 2 D echo showed EF 45%   Cardiology consulted for input due to recent decline in EF and felt acute on chronic respiratory failure was pulmonary in nature and that effusion was parapneumonia.   On 6/1, he underwent right thoracocentesis of 1.8 L dark red fluid and has had some bleeding from site. MBS done revealing functional swallow without penetration or aspiration on regular, thins.   BIPAP discontinued as patient has been refusing to wear it. RLE PNA felt to be due to aspiration with exudative effusion and antibiotics narrowed to Augmentin on 6/4 and completed by 6/7--will need follow up CT chest in 4-5 weeks.   Iron deficiency anemia treated with dose of feraheme on 6/4. Lasix added for diuresisand adjusted due toworsening of renal status.   He continues to require oxygenduring the day and started desaturating at nights requiring NRBM 6/8. Pulmonary  consulted and recommended trial of CPAP due to significant desaturation at night--will require some time of NIV. Nocturnal autotitration with CPAP attempted last night without success due to claustophobia.   Currently in CIR status for rehabilitation    Currently being present with decision regarding dialysis.  Knee function continues to decline and patient continues to decline physically and functionally secondary to overall failure to thrive and multiple comorbidities  Patient and family face treatment option decisions, advanced directive decisions and anticipatory care needs.  Clinical Assessment and Goals of Care:   This NP Wadie Lessen reviewed medical records, received report from team, assessed the patient and then meet at the patient's bedside and then spoke to wife by phone  to discuss diagnosis, prognosis, GOC, EOL wishes disposition and options.  Concept of Hospice and Palliative Care were discussed  A detailed discussion was had today regarding advanced directives.  Concepts specific to code status, artifical feeding and hydration, continued IV antibiotics and rehospitalization was had.  The difference between a aggressive medical intervention path  and a palliative comfort care path for this patient at this time was had.  Values and goals of care important to patient and family were attempted to be elicited.   Patient and his wife verbalize some misunderstanding  and confusion regarding the decision of dialysis.  Currently patient is considering the possibility of dialysis at home.  We had a long conversation regarding the logistics prior to and ongoing with the patient on home dialysis, be that peritoneal or hemodialysis  Both the patient and his wife tell me that they did not realize that a "real decision" on excepting dialysis was eminent.  They thought they had more time to make that ultimate decision.  Although the patient himself verbalized "I will never do that" he now is having  second thoughts as he faces his mortality.  I shared with both the patient and his wife that without dialysis he clearly is a hospice patient with a prognosis of less than 6 months.    Natural trajectory and expectations at EOL were discussed.  Questions and concerns addressed.   Family encouraged to call with questions or concerns.    PMT will continue to support holistically.   In hopes of clarifying any questions or concerns a meeting is scheduled for tomorrow with the patient, his wife, myself and Dr. Jimmy Footman.  Inpatient rehab is unable to attend     SUMMARY OF RECOMMENDATIONS    Code Status/Advance Care Planning:  DNR   Psycho-social/Spiritual:   Desire for further Chaplaincy support:no  Additional Recommendations: Education on Hospice  Prognosis:   Unable to determine-be dependent on desire for life prolonging measures.  Discharge Planning: To Be Determined      Primary Diagnoses: Present on Admission: . Acute on chronic respiratory failure ([place]) . Atrial fibrillation with RVR ([place]) . Chronic diastolic heart failure ([place]) . HCAP (healthcare-associated pneumonia)   I have reviewed the medical record, interviewed the patient and family, and examined the patient. The following aspects are pertinent.  Past Medical History:  Diagnosis Date  . Alcohol abuse   . Colon cancer ([place])   . COPD (chronic obstructive pulmonary disease) ([place])   . Emphysema of lung ([place])   . Former tobacco use   . Hypertension   . Peripheral edema   . Sepsis ([place]) 10/2016   Aspiration PNA/C diff colitis   Social History   Socioeconomic History  . Marital status: Married    Spouse name: Not on file  . Number of children: 3  . Years of education: Not on file  . Highest education level: Not on file  Occupational History  . Occupation: Retired- Associate Professor  Social Needs  . Financial resource strain: Not on file  . Food insecurity    Worry: Patient refused     Inability: Patient refused  . Transportation needs    Medical: Patient refused    Non-medical: Patient refused  Tobacco Use  . Smoking status: Former Smoker    Packs/day: 1.00    Years: 20.00    Pack years: 20.00    Types: Cigarettes    Quit date: 12/20/1981    Years since quitting: 37.4  . Smokeless tobacco: Never Used  Substance and Sexual Activity  . Alcohol use: Yes    Alcohol/week: 7.0 standard drinks    Types: 7 Standard drinks or equivalent per week    Comment: states he drinks 3-4 days a week, "a couple" on those occasions but does not further quaniity.  . Drug use: No  . Sexual activity: Never  Lifestyle  . Physical activity    Days per week: Patient refused    Minutes per session: Patient refused  . Stress: Not on file  Relationships  . Social connections  Talks on phone: Patient refused    Gets together: Patient refused    Attends religious service: Patient refused    Active member of club or organization: Patient refused    Attends meetings of clubs or organizations: Patient refused    Relationship status: Patient refused  Other Topics Concern  . Not on file  Social History Narrative  . Not on file   Family History  Problem Relation Age of Onset  . Brain cancer Father   . Colon cancer Mother        with mets to liver  . Lung cancer Brother        was a smoker   Scheduled Meds: . allopurinol  100 mg Oral Daily  . amiodarone  200 mg Oral Daily  . apixaban  2.5 mg Oral BID  . bisacodyl  10 mg Oral Daily  . budesonide (PULMICORT) nebulizer solution  0.25 mg Nebulization BID  . furosemide  160 mg Oral BID  . hydrocerin   Topical BID  . ipratropium-albuterol  3 mL Nebulization BID  . sodium zirconium cyclosilicate  10 g Oral BID   Continuous Infusions: PRN Meds:.acetaminophen, albuterol, alum & mag hydroxide-simeth, bisacodyl, camphor-menthol, diphenhydrAMINE, guaiFENesin-dextromethorphan, nitroGLYCERIN, polyethylene glycol, prochlorperazine **OR**  prochlorperazine **OR** prochlorperazine, traZODone Medications Prior to Admission:  Prior to Admission medications   Medication Sig Start Date End Date Taking? Authorizing Provider  allopurinol (ZYLOPRIM) 100 MG tablet Take 100 mg by mouth daily. 02/17/19   [provider]  amiodarone (PACERONE) 200 MG tablet Take 1 tablet (200 mg total) by mouth daily. 04/17/19   Erlene Quan, PA-C  apixaban (ELIQUIS) 2.5 MG TABS tablet Take 1 tablet (2.5 mg total) by mouth 2 (two) times daily. 05/30/19   Kayleen Memos, DO  bisoprolol (ZEBETA) 5 MG tablet Take 5 mg by mouth daily.    [provider]  budesonide (PULMICORT) 0.25 MG/2ML nebulizer solution Take 2 mLs (0.25 mg total) by nebulization 2 (two) times daily. 05/30/19   Kayleen Memos, DO  ferrous sulfate 325 (65 FE) MG tablet Take 325 mg by mouth daily.     [provider]  furosemide (LASIX) 40 MG tablet Take 1 tablet (40 mg total) by mouth 2 (two) times daily. 05/30/19   Kayleen Memos, DO  ipratropium-albuterol (DUONEB) 0.5-2.5 (3) MG/3ML SOLN Use twice a day scheduled and every 4 hours as needed for shortness of breath and wheezing Patient not taking: Reported on 05/19/2019 03/26/19   Thurnell Lose, MD  Multiple Vitamin (MULTIVITAMIN WITH MINERALS) TABS tablet Take 1 tablet by mouth daily.    [provider]  OXYGEN Inhale 4 L into the lungs continuous.    [provider]   Allergies  Allergen Reactions  . Flexeril [Cyclobenzaprine] Other (See Comments)    Unknown reaction    Review of Systems  Constitutional: Positive for fatigue.  Neurological: Positive for weakness.    Physical Exam Cardiovascular:     Rate and Rhythm: Normal rate and regular rhythm.  Pulmonary:     Effort: Pulmonary effort is normal.  Skin:    General: Skin is warm and dry.  Neurological:     Mental Status: He is alert.     Vital Signs: BP (!) 101/59 (BP Location: Left Arm)   Pulse 60   Temp (!) 97.5 F (36.4 C)  (Oral)   Resp 18   Ht 5\' 10"  (1.778 m)   Wt 103.8 kg   SpO2 90%  BMI 32.83 kg/m  Pain Scale: 0-10   Pain Score: 0-No pain   SpO2: SpO2: 90 % O2 Device:SpO2: 90 % O2 Flow Rate: .O2 Flow Rate (L/min): 8 L/min  IO: Intake/output summary:   Intake/Output Summary (Last 24 hours) at 06/05/2019 1103 Last data filed at 06/05/2019 0900 Gross per 24 hour  Intake 460 ml  Output 350 ml  Net 110 ml    LBM: Last BM Date: 06/03/19 Baseline Weight: Weight: 100.7 kg Most recent weight: Weight: 103.8 kg     Palliative Assessment/Data: 50 %   Discussed with Dr Jimmy Footman and Reesa Chew PA-C  Time In: 1315 Time Out: 1430 Time Total: 75 minutes Greater than 50%  of this time was spent counseling and coordinating care related to the above assessment and plan.  Signed by: Wadie Lessen, NP   Please contact Palliative Medicine Team phone at [phone] for questions and concerns.  For individual provider: See Shea Evans

## 2019-06-05 NOTE — Progress Notes (Signed)
Pt refused blood work this afternoon.

## 2019-06-05 NOTE — Progress Notes (Signed)
Subjective: Interval History: has complaints wants to think about it. At this time no dialysis.  Objective: Vital signs in last 24 hours: Temp:  [97.5 F (36.4 C)-97.8 F (36.6 C)] 97.5 F (36.4 C) (06/16 0643) Pulse Rate:  [60-64] 60 (06/16 0643) Resp:  [16-18] 18 (06/16 0643) BP: (101)/(59) 101/59 (06/16 0643) SpO2:  [82 %-93 %] 90 % (06/16 0756) Weight:  [103.8 kg] 103.8 kg (06/16 0800) Weight change:   Intake/Output from previous day: 06/15 0701 - 06/16 0700 In: 340 [P.O.:340] Out: 200 [Urine:200] Intake/Output this shift: Total I/O In: 240 [P.O.:240] Out: 150 [Urine:150]  General appearance: alert, cooperative, no distress and moderately obese Resp: rhonchi bibasilar Cardio: S1, S2 normal and systolic murmur: systolic ejection 2/6, crescendo and decrescendo at 2nd left intercostal space GI: obese, pos bs, liver down 6 cm Extremities: edema 2+  Lab Results: No results for input(s): WBC, HGB, HCT, PLT in the last 72 hours. BMET:  Recent Labs    06/04/19 0616 06/04/19 1222  NA 139 138  K 5.4* 5.9*  CL 100 98  CO2 25 27  GLUCOSE 106* 126*  BUN 117* 120*  CREATININE 5.61* 5.79*  CALCIUM 8.8* 8.9   No results for input(s): PTH in the last 72 hours. Iron Studies: No results for input(s): IRON, TIBC, TRANSFERRIN, FERRITIN in the last 72 hours.  Studies/Results: Dg Chest 2 View  Result Date: 06/04/2019 CLINICAL DATA:  Shortness of breath and weakness. EXAM: CHEST - 2 VIEW COMPARISON:  Radiographs 06/02/2019 and 06/01/2019.  CT 05/20/2019. FINDINGS: Stable cardiac enlargement. There has been no significant change in the right-greater-than-left pleural effusions and associated bibasilar airspace opacities. There is no pneumothorax or pulmonary edema. The bones appear unchanged. IMPRESSION: Unchanged residual small pleural effusions and bibasilar airspace opacities. No new findings. Electronically Signed   By: Richardean Sale M.D.   On: 06/04/2019 11:28    I have  reviewed the patient's current medications.  Assessment/Plan: 1 CKD5 needs K control, given Lokelma.  Diet changes.  He does not want dialysis now but considering 2 DM  3 CM 4 Anemia 5 HPTH P counseled 40 min, about options. Will follow.    LOS: 6 days   David Barton 06/05/2019,11:23 AM

## 2019-06-05 NOTE — Progress Notes (Signed)
Occupational Therapy Session Note  Patient Details  Name: KEAUNDRE THELIN MRN: [id number] Date of Birth: 09-14-1933  Today's Date: 06/05/2019 OT Individual Time: 4650-3546 OT Individual Time Calculation (min): 60 min    Short Term Goals: Week 1:  OT Short Term Goal 1 (Week 1): STG = LTG due to length of stay  Skilled Therapeutic Interventions/Progress Updates:    Treatment session with focus on activity tolerance and functional mobility.  Pt received semi-reclined in bed reporting sleeping better over night and feeling "somewhat" better this AM.  Per respiratory note, pt on 8L O2 at this time due to low sats.  Agreeable to bathing and dressing at sit > stand level from EOB as pt not feeling up to washing in bathroom.  Pt required frequent rest breaks throughout bathing with cues for breathing technique.  Discussed home setup and pt's wife's work schedule in regards to supervision vs Mod I level as pt continues to require CGA with mobility due to decreased lung capacity and DOE.  Pt ambulated 65' without AD with CGA to close supervision, pt frequently reaching for table or railing for UE support - may benefit from RW for balance and endurance.  O2 sats 91% on 8L O2.  Discussed possibility with pt and PT.  Pt returned to sitting in recliner and left upright with all needs in reach.    Therapy Documentation Precautions:  Precautions Precautions: Fall, Other (comment) Precaution Comments: monitor SpO2 (4L-oxygen dependent baseline) Restrictions Weight Bearing Restrictions: No Pain:  Pt with no c/o pain   Therapy/Group: Individual Therapy  [name] 06/05/2019, 12:25 PM

## 2019-06-05 NOTE — Progress Notes (Signed)
SAT 99% on [place] HFNC 8L, dropped to 6L HFNC SAT now 90%, pt sitting at 55 degrees in bed.

## 2019-06-05 NOTE — Progress Notes (Signed)
Spo2 80-82% on 5L [place]. Placed pt on [place] HFNC 8L. Spo2 now 90%

## 2019-06-05 NOTE — Progress Notes (Signed)
Occupational Therapy Session Note  Patient Details  Name: David Barton MRN: [id number] Date of Birth: Sep 30, 1933  Today's Date: 06/05/2019 OT Individual Time: 1400-1455 OT Individual Time Calculation (min): 55 min    Short Term Goals: Week 1:  OT Short Term Goal 1 (Week 1): STG = LTG due to length of stay  Skilled Therapeutic Interventions/Progress Updates:    Treatment session with focus on d/c planning and activity tolerance. Pt received upright in recliner reporting feeling "better" but still "not great".  Pt reports still processing information given by nephrologist and wavering on how he wants to proceed with his medical issues.  Engaged in prolonged discussion regarding quality of life and pt wishes regarding remainder of his rehab stay - expressing desire to d/c home by the end of the week at the latest.  Discussed various DME/AE recommendations to increase pt safety and endurance upon d/c home as well as pursing HH as tolerated.  Pt pleased with therapy, but frequently stating that it may be too much for him at this time.  Engaged in sit > stand with supervision with pt tolerating x2 with prolonged rest break between.  Encouraged pt to continue to discuss his options with his medical team and his wife to allow for a more formalized plan.  Pt appreciative of time spent this session.  Therapy Documentation Precautions:  Precautions Precautions: Fall, Other (comment) Precaution Comments: monitor SpO2 (4L-oxygen dependent baseline) Restrictions Weight Bearing Restrictions: No General:   Vital Signs: Therapy Vitals Temp: 97.6 F (36.4 C) Temp Source: Oral Pulse Rate: 92 Resp: 19 BP: 94/65 Patient Position (if appropriate): Sitting Oxygen Therapy SpO2: 90 % O2 Device: High Flow Nasal Cannula O2 Flow Rate (L/min): 6 L/min Patient Activity (if Appropriate): In bed(HOB 50) Pulse Oximetry Type: Continuous Pain:  Pt with no c/o pain   Therapy/Group: Individual  Therapy  [name] 06/05/2019, 3:20 PM

## 2019-06-05 NOTE — Progress Notes (Signed)
Spot check O2, pt sat 80% on 5L HFNC, increased to 8L HFNC sat 88/89%, pt sitting EOB for dinner, call light w/i reach.

## 2019-06-05 NOTE — Progress Notes (Signed)
Physical Therapy Session Note  Patient Details  Name: David Barton MRN: [id number] Date of Birth: February 26, 1933  Today's Date: 06/05/2019 PT Individual Time: 1135-1215 PT Individual Time Calculation (min): 40 min   Short Term Goals: Week 1:  PT Short Term Goal 1 (Week 1): = to LTGs based on ELOS  Skilled Therapeutic Interventions/Progress Updates:     Patient in recliner in room upon PT arrival. Patient alert and agreeable to PT session, on 6 L O2 throughout session. Patient reported that the nephrologist had just come by with some "bad news." He stated that the MD wants him to start on dialysis and he does not want to commit to the schedule required to do that. He asked about home dialysis, stating that the doctor had mentioned it. PT provided general education on home dialysis and encouraged the patient to ask his doctors more about it, as he seemed more open to that as opposed to outpatient dialysis. Patient was very upset by the news from the doctor and was concerned about telling his wife. PT comforted patient and recommended a consult with the neuropsychologist, CSW made aware, and patient was open to speaking with him. Educated patient on safe vitals at home, management of home oxygen, use of 10 point RPE scale, fall risk/safety at home, and activation of emergency response in the event of a fall.   Therapeutic Activity: Transfers: Patient performed sit to/from stand x1 and a toilet transfer using the no AD and managing O2 line with close supervision. Required supervision for peri-care and LB dressing.  Gait Training:  Patient ambulated 15 feet to and from the bathroom using no AD with close supervision, periodically would reaching out for something to hold on to, but no LOB, O2 92% on 6L O2 and RPE 4-5/10 after. Ambulated with decreased gait speed, decreased step length, increased hip and knee flexion, forward trunk lean, and downward head gaze. Provided verbal cues for erect posture, and  looking ahead.  Patient in recliner at end of session with breaks locked, chair alarm set, and all needs within reach. Patient missed 20 min of skilled PT, stating "I am not up for exercising right now" due to the difficulty of hearing the news from his nephrologist and that he needed to speak to his wife.    Therapy Documentation Precautions:  Precautions Precautions: Fall, Other (comment) Precaution Comments: monitor SpO2 (4L-oxygen dependent baseline) Restrictions Weight Bearing Restrictions: No  Pain: Pain Assessment Pain Scale: 0-10 Pain Score: 0-No pain    Therapy/Group: Individual Therapy  Kaylla Cobos L Deuce P[place]oster PT, DPT  06/05/2019, 4:41 PM

## 2019-06-05 NOTE — Progress Notes (Signed)
[facility] PHYSICAL MEDICINE & REHABILITATION PROGRESS NOTE   Subjective/Complaints: Patient seen laying in bed this morning.  He states he slept well overnight.  He feels he is declining.  Discussed situation with multiple parties yesterday.  Discussed plan of care with patient today.  Patient has questions regarding home dialysis.  Discussed with PA, discussed with nephro.  Discussed with nursing and respiratory therapy as well regarding need for high flow oxygen.  By nephro yesterday, notes reviewed-Lasix.  Review of systems: + Shortness of breath, unchanged.  Denies chest pain, nausea, vomiting, diarrhea.  Objective:   Dg Chest 2 View  Result Date: 06/04/2019 CLINICAL DATA:  Shortness of breath and weakness. EXAM: CHEST - 2 VIEW COMPARISON:  Radiographs 06/02/2019 and 06/01/2019.  CT 05/20/2019. FINDINGS: Stable cardiac enlargement. There has been no significant change in the right-greater-than-left pleural effusions and associated bibasilar airspace opacities. There is no pneumothorax or pulmonary edema. The bones appear unchanged. IMPRESSION: Unchanged residual small pleural effusions and bibasilar airspace opacities. No new findings. Electronically Signed   By: Richardean Sale M.D.   On: 06/04/2019 11:28   No results for input(s): WBC, HGB, HCT, PLT in the last 72 hours. Recent Labs    06/04/19 0616 06/04/19 1222  NA 139 138  K 5.4* 5.9*  CL 100 98  CO2 25 27  GLUCOSE 106* 126*  BUN 117* 120*  CREATININE 5.61* 5.79*  CALCIUM 8.8* 8.9    Intake/Output Summary (Last 24 hours) at 06/05/2019 0935 Last data filed at 06/05/2019 0325 Gross per 24 hour  Intake 220 ml  Output 200 ml  Net 20 ml     Physical Exam: Vital Signs Blood pressure (!) 101/59, pulse 60, temperature (!) 97.5 F (36.4 C), temperature source Oral, resp. rate 18, height 5\' 10"  (1.778 m), weight 103.8 kg, SpO2 90 %. Constitutional: No distress . Vital signs reviewed. HENT: Normocephalic.  Atraumatic. Eyes:  EOMI.  No discharge. Cardiovascular: No JVD. Respiratory: Increased WOB.  + [place]. GI: Non-distended. Musc: No edema or tenderness in extremities. Neurologic: Alert and oriented Motor: 4-4+/5 throughout Skin: Warm and dry.  Intact.   Assessment/Plan: 1. Functional deficits secondary to debility which require 3+ hours per day of interdisciplinary therapy in a comprehensive inpatient rehab setting.  Physiatrist is providing close team supervision and 24 hour management of active medical problems listed below.  Physiatrist and rehab team continue to assess barriers to discharge/monitor patient progress toward functional and medical goals  Care Tool:  Bathing    Body parts bathed by patient: Right arm, Left arm, Chest, Abdomen, Front perineal area, Buttocks, Right upper leg, Left upper leg, Face(Pt reports that he occassionally washes his feet but he did not during eval)         Bathing assist Assist Level: Supervision/Verbal cueing     Upper Body Dressing/Undressing Upper body dressing   What is the patient wearing?: Pull over shirt    Upper body assist Assist Level: Set up assist    Lower Body Dressing/Undressing Lower body dressing      What is the patient wearing?: Pants, Underwear/pull up     Lower body assist Assist for lower body dressing: Set up assist     Toileting Toileting    Toileting assist Assist for toileting: Supervision/Verbal cueing     Transfers Chair/bed transfer  Transfers assist     Chair/bed transfer assist level: Contact Guard/Touching assist     Locomotion Ambulation   Ambulation assist      Assist  level: Contact Guard/Touching assist Assistive device: No Device Max distance: 69ft   Walk 10 feet activity   Assist     Assist level: Contact Guard/Touching assist Assistive device: No Device   Walk 50 feet activity   Assist    Assist level: Contact Guard/Touching assist Assistive device: No Device    Walk 150 feet  activity   Assist Walk 150 feet activity did not occur: Safety/medical concerns         Walk 10 feet on uneven surface  activity   Assist Walk 10 feet on uneven surfaces activity did not occur: Safety/medical concerns         Wheelchair     Assist Will patient use wheelchair at discharge?: No(TBD but do not anticipate will need w/c unless for community mobility)             Wheelchair 50 feet with 2 turns activity    Assist            Wheelchair 150 feet activity     Assist          Medical Problem List and Plan: 1.   Debility secondary to pneumonia causing acute exacerbation of chronic respiratory failure  Continue CIR  Will consult palliative care  Will discuss with Nephro for edu for patient regarding home HD 2. Antithrombotics: -DVT/anticoagulation:Pharmaceutical:Other (comment)--Elquis -antiplatelet therapy: N/A 3. Pain Management:N/A 4. Mood:LCSW to follow for evaluation and support. -antipsychotic agents: N/A 5. Neuropsych: This patientiscapable of making decisions on hisown behalf. 6. Skin/Wound Care:Routine pressure relief measures. 7. Fluids/Electrolytes/Nutrition:strict I/O.  8. COPD with chronic hypoxic failure:Pulmicort bid with 4liters oxygenduring the day and VM- 8L at nights. Wean to least amount needed to keep saturations 88-92%. Will need PCCM to evaluate oxygen needs/set sleep study prior to discharge. PRN albuterol  Chest x-ray on 6/13 reviewed-showing improvement  Repeat chest x-ray reviewed, relatively stable 9. Aspiration PNA:Has completed a week course ofAugmentin.Afebrile without cough  10: Afib/Aflutter:Now back on Eliquis--for DCCVin the futureafter pulmonary status improves? 11. Acute on Chronic systolic:On Zebeta.Monitor daily weights as well as strict I/O. Filed Weights   06/03/19 0440 06/04/19 0714 06/05/19 0800  Weight: 103.7 kg 102.2 kg 103.8 kg   Stable  on 6/16  Appreciate nephro recs 12. CKD stage TD:VVOHYWVP by Dr. Moshe Cipro.Has refused HD creat up to 4.5, appreciate nephrology follow-up.    Renal ultrasound suggesting medical renal disease  Creatinine 5.79 on 6/15 13. Sick Euthyroid?: Will need repeat TSH in 3-4 weeks 14.  Hyperkalemia  Potassium 5.9 on 6/15, labs ordered for tomorrow  Discussed with pharmacy  15.  Leukocytosis  WBCs 11.7 on 6/11, labs pending  LOS: 6 days A FACE TO FACE EVALUATION WAS PERFORMED  Chason Mciver Lorie Phenix 06/05/2019, 9:35 AM

## 2019-06-06 ENCOUNTER — Inpatient Hospital Stay (HOSPITAL_COMMUNITY): Payer: Medicare Other

## 2019-06-06 ENCOUNTER — Inpatient Hospital Stay (HOSPITAL_COMMUNITY): Payer: Medicare Other | Admitting: Physical Therapy

## 2019-06-06 ENCOUNTER — Ambulatory Visit: Payer: Medicare Other | Admitting: Cardiovascular Disease

## 2019-06-06 DIAGNOSIS — R0602 Shortness of breath: Secondary | ICD-10-CM

## 2019-06-06 DIAGNOSIS — Z66 Do not resuscitate: Secondary | ICD-10-CM

## 2019-06-06 DIAGNOSIS — R918 Other nonspecific abnormal finding of lung field: Secondary | ICD-10-CM

## 2019-06-06 DIAGNOSIS — Z515 Encounter for palliative care: Secondary | ICD-10-CM

## 2019-06-06 LAB — RENAL FUNCTION PANEL
Albumin: 2.6 g/dL — ABNORMAL LOW (ref 3.5–5.0)
Anion gap: 15 (ref 5–15)
BUN: 132 mg/dL — ABNORMAL HIGH (ref 8–23)
CO2: 25 mmol/L (ref 22–32)
Calcium: 8.9 mg/dL (ref 8.9–10.3)
Chloride: 99 mmol/L (ref 98–111)
Creatinine, Ser: 5.81 mg/dL — ABNORMAL HIGH (ref 0.61–1.24)
GFR calc Af Amer: 10 mL/min — ABNORMAL LOW (ref >60)
GFR calc non Af Amer: 8 mL/min — ABNORMAL LOW (ref >60)
Glucose, Bld: 146 mg/dL — ABNORMAL HIGH (ref 70–99)
Phosphorus: 6.9 mg/dL — ABNORMAL HIGH (ref 2.5–4.6)
Potassium: 4.4 mmol/L (ref 3.5–5.1)
Sodium: 139 mmol/L (ref 135–145)

## 2019-06-06 NOTE — Progress Notes (Signed)
Physical Therapy Session Note  Patient Details  Name: David Barton MRN: [id number] Date of Birth: 1933-10-02  Today's Date: 06/06/2019 PT Individual Time: 11:00-11:05 session 1, 14:10-14:13 session 2  Short Term Goals: Week 1:  PT Short Term Goal 1 (Week 1): = to LTGs based on ELOS  Skilled Therapeutic Interventions/Progress Updates:  Session 1 Pt in bed on arrival. Reporting he just doesn't feel well today. Does not feel he can do anything right now as he is too tired from not sleeping last night. Reports he got bad news and that "it's not good". Pt reports his doctor and spouse are coming this after noon at 1pm to discuss options. PT was scheduled to see patient again at 1pm. Moved to 2pm due to MD conference scheduled.  Session 2 Pt's meeting with renal doctor, palliative NP and spouse ran into this session. Once it was done, PTA went into room. Was greeted by by Palliative NP who stated the pt had just said he "wants to do therapy, however is just too tired." Pt then stating "I'll start back tomorrow". Pt left with spouse in room to discuss all options regarding HD as they plan to have a decision by tomorrow morning.    Therapy Documentation Precautions:  Precautions Precautions: Fall, Other (comment) Precaution Comments: monitor SpO2 (4L-oxygen dependent baseline) Restrictions Weight Bearing Restrictions: No General: PT Amount of Missed Time (min): 60 Minutes in am, 60 minutes in pm PT Missed Treatment Reason: Patient unwilling to participate;Patient fatigue;Other (Comment)(reports not sleeping at all last night). X 2 sessions.  Vital Signs: Therapy Vitals Pulse Rate: 82 Resp: 18 Patient Position (if appropriate): Lying Oxygen Therapy SpO2: 94 % O2 Device: High Flow Nasal Cannula O2 Flow Rate (L/min): 10 L/min Patient Activity (if Appropriate): In bed Pulse Oximetry Type: Continuous    Therapy/Group: Individual Therapy  Willow Ora, PTA 06/06/2019, 11:19 AM

## 2019-06-06 NOTE — Progress Notes (Signed)
Subjective: Interval History: has complaints has refused blood draws.  Objective: Vital signs in last 24 hours: Temp:  [97.6 F (36.4 C)-98.6 F (37 C)] 97.8 F (36.6 C) (06/17 0517) Pulse Rate:  [73-92] 82 (06/17 0846) Resp:  [15-22] 18 (06/17 0846) BP: (94-110)/(63-65) 97/63 (06/17 0517) SpO2:  [80 %-99 %] 94 % (06/17 0849) Weight:  [104.7 kg] 104.7 kg (06/17 0500) Weight change: 1.6 kg  Intake/Output from previous day: 06/16 0701 - 06/17 0700 In: 525 [P.O.:525] Out: 300 [Urine:300] Intake/Output this shift: Total I/O In: 120 [P.O.:120] Out: 200 [Urine:200]  General appearance: cooperative, no distress, moderately obese and pale Resp: rales bibasilar and wheezes bilaterally Cardio: S1, S2 normal and systolic murmur: systolic ejection 2/6, crescendo and decrescendo at 2nd left intercostal space GI: soft, non-tender; bowel sounds normal; no masses,  no organomegaly Extremities: edema 2+  Abdm obese, pos bs  Lab Results: No results for input(s): WBC, HGB, HCT, PLT in the last 72 hours. BMET:  Recent Labs    06/04/19 0616 06/04/19 1222  NA 139 138  K 5.4* 5.9*  CL 100 98  CO2 25 27  GLUCOSE 106* 126*  BUN 117* 120*  CREATININE 5.61* 5.79*  CALCIUM 8.8* 8.9   No results for input(s): PTH in the last 72 hours. Iron Studies: No results for input(s): IRON, TIBC, TRANSFERRIN, FERRITIN in the last 72 hours.  Studies/Results: No results found.  I have reviewed the patient's current medications.  Assessment/Plan: 1 CKD 5 xs vol.  ??K .  Have repeatedly discussed options with him.  Will meet with wife this pm. 2 CM 3 Anemia 4 HPTH 5 Obesity 6 COPD P family discussions.     LOS: 7 days   Jeneen Rinks Lanice Folden 06/06/2019,11:55 AM

## 2019-06-06 NOTE — Progress Notes (Signed)
Patient ID: David Barton, male   DOB: 03-18-1933, 85 y.o.   MRN: [id number]  This NP visited patient at the bedside as a follow up to  yesterday's [place], and to meet with wife at bedside for continued conversation regarding current medical situation; specific to decision related to the  initiation of dialysis.    Dr Deterding was present at bedside to discuss the logistics of initiation of and ongoing  dialysis.   Questions and concerns addressed.  Patient struggles with  living with chronic disease, and  serious life limiting disease.  For the first time in his life David Barton tells me he is "facing my mortality."  Patient and his wife understand the importance of making a decision regarding dialysis.  They plan to let Dr. Jimmy Footman know their decision in the morning.    Discussed with patient the importance of continued conversation with his  Family/wife  and the medical providers regarding overall plan of care and treatment options,  ensuring decisions are within the context of the patients values and GOCs.  Patient's wife verbalizes support for her husband and what ever decision he makes for himself.  Questions and concerns addressed   Discussed with Dr Jimmy Footman and Reesa Chew PA-C  Total time spent on the unit was 60 minutes  Greater than 50% of the time was spent in counseling and coordination of care  Wadie Lessen NP  Palliative Medicine Team Team Phone # [phone] Pager [phone]

## 2019-06-06 NOTE — Progress Notes (Signed)
Patient ID: David Barton, male   DOB: 30-Oct-1933, 85 y.o.   MRN: [id number] 45 min family meeting on risks/benefits of RRt.  Marland Kitchen He is yet to make decision

## 2019-06-06 NOTE — Progress Notes (Addendum)
[facility] PHYSICAL MEDICINE & REHABILITATION PROGRESS NOTE   Subjective/Complaints: Patient seen lying in bed this morning.  He states he slept well overnight.  He states he had discussion with palliative care and nephrology yesterday.  He states he is agreeable to home dialysis, however per discussion with nursing, this may be less likely.  Patient also notes that there may be more complexities involved.  He is going to have a further discussion with palliative care, nephrology, his wife regarding complexity of home HD.  He states he was told by palliative care he is 2 weeks to live.  He states he does not have the endurance to participate in therapies.  Review of systems: + Shortness of breath, stable.  Denies chest pain, nausea, vomiting, diarrhea.  Objective:   Dg Chest 2 View  Result Date: 06/04/2019 CLINICAL DATA:  Shortness of breath and weakness. EXAM: CHEST - 2 VIEW COMPARISON:  Radiographs 06/02/2019 and 06/01/2019.  CT 05/20/2019. FINDINGS: Stable cardiac enlargement. There has been no significant change in the right-greater-than-left pleural effusions and associated bibasilar airspace opacities. There is no pneumothorax or pulmonary edema. The bones appear unchanged. IMPRESSION: Unchanged residual small pleural effusions and bibasilar airspace opacities. No new findings. Electronically Signed   By: Richardean Sale M.D.   On: 06/04/2019 11:28   No results for input(s): WBC, HGB, HCT, PLT in the last 72 hours. Recent Labs    06/04/19 0616 06/04/19 1222  NA 139 138  K 5.4* 5.9*  CL 100 98  CO2 25 27  GLUCOSE 106* 126*  BUN 117* 120*  CREATININE 5.61* 5.79*  CALCIUM 8.8* 8.9    Intake/Output Summary (Last 24 hours) at 06/06/2019 0845 Last data filed at 06/06/2019 0704 Gross per 24 hour  Intake 525 ml  Output 500 ml  Net 25 ml     Physical Exam: Vital Signs Blood pressure 97/63, pulse 82, temperature 97.8 F (36.6 C), temperature source Oral, resp. rate (!) 22, height  5\' 10"  (1.778 m), weight 104.7 kg, SpO2 (!) 85 %. Constitutional: No distress . Vital signs reviewed. HENT: Normocephalic.  Atraumatic. Eyes: EOMI.  No discharge. Cardiovascular: No JVD. Respiratory: Increased WOB.  + [place]. GI: Non-distended. Musc: No edema or tenderness in extremities. Neurologic: Alert and oriented Motor: 4-4+/5 throughout, unchanged. Skin: Warm and dry.  Intact.   Assessment/Plan: 1. Functional deficits secondary to debility which require 3+ hours per day of interdisciplinary therapy in a comprehensive inpatient rehab setting.  Physiatrist is providing close team supervision and 24 hour management of active medical problems listed below.  Physiatrist and rehab team continue to assess barriers to discharge/monitor patient progress toward functional and medical goals  Care Tool:  Bathing    Body parts bathed by patient: Right arm, Left arm, Chest, Abdomen, Face         Bathing assist Assist Level: Supervision/Verbal cueing     Upper Body Dressing/Undressing Upper body dressing   What is the patient wearing?: Pull over shirt    Upper body assist Assist Level: Set up assist    Lower Body Dressing/Undressing Lower body dressing      What is the patient wearing?: Pants, Underwear/pull up     Lower body assist Assist for lower body dressing: Set up assist     Toileting Toileting    Toileting assist Assist for toileting: Supervision/Verbal cueing     Transfers Chair/bed transfer  Transfers assist     Chair/bed transfer assist level: Contact Guard/Touching assist  Locomotion Ambulation   Ambulation assist      Assist level: Supervision/Verbal cueing Assistive device: No Device Max distance: 15'   Walk 10 feet activity   Assist     Assist level: Supervision/Verbal cueing Assistive device: No Device   Walk 50 feet activity   Assist    Assist level: Contact Guard/Touching assist Assistive device: No Device    Walk  150 feet activity   Assist Walk 150 feet activity did not occur: Safety/medical concerns         Walk 10 feet on uneven surface  activity   Assist Walk 10 feet on uneven surfaces activity did not occur: Safety/medical concerns         Wheelchair     Assist Will patient use wheelchair at discharge?: No(TBD but do not anticipate will need w/c unless for community mobility)             Wheelchair 50 feet with 2 turns activity    Assist            Wheelchair 150 feet activity     Assist          Medical Problem List and Plan: 1.   Debility secondary to pneumonia causing acute exacerbation of chronic respiratory failure, now with cardiorenal syndrome  Continue CIR as tolerated, likely home with hospice soon  Appreciate palliative care recs  Appreciate nephro follow-up  Team conference today to discuss current and goals and coordination of care, home and environmental barriers, and discharge planning with nursing, case manager, and therapies.  2. Antithrombotics: -DVT/anticoagulation:Pharmaceutical:Other (comment)--Elquis -antiplatelet therapy: N/A 3. Pain Management:N/A 4. Mood:LCSW to follow for evaluation and support. -antipsychotic agents: N/A 5. Neuropsych: This patientiscapable of making decisions on hisown behalf. 6. Skin/Wound Care:Routine pressure relief measures. 7. Fluids/Electrolytes/Nutrition:strict I/O.  8. COPD with chronic hypoxic failure:Pulmicort bid with 4liters oxygenduring the day and VM- 8L at nights. Wean to least amount needed to keep saturations 88-92%. Will need PCCM to evaluate oxygen needs/set sleep study prior to discharge. PRN albuterol  Chest x-ray on 6/13 reviewed-showing improvement, repeat chest x-ray reviewed, relatively stable 9. Aspiration PNA:Has completed a week course ofAugmentin.Afebrile without cough  10: Afib/Aflutter:Now back on Eliquis--for DCCVin the  futureafter pulmonary status improves?-Appears unlikely at this point. 11. Acute on Chronic systolic:On Zebeta.Monitor daily weights as well as strict I/O. Filed Weights   06/04/19 0714 06/05/19 0800 06/06/19 0500  Weight: 102.2 kg 103.8 kg 104.7 kg   Increasing on 6/17  Appreciate nephro recs 12. CKD stage IR:SWNIOEVO by Dr. Moshe Cipro.Has refused HD creat up to 4.5, appreciate nephrology follow-up.    Renal ultrasound suggesting medical renal disease  Creatinine 5.79 on 6/15, labs pending 13. Sick Euthyroid?: Will need repeat TSH in 3-4 weeks 14.  Hyperkalemia  Potassium 5.9 on 6/15, labs pending  Discussed with pharmacy  15.  Leukocytosis  WBCs 11.7 on 6/11  LOS: 7 days A FACE TO FACE EVALUATION WAS PERFORMED  Korbyn Chopin Lorie Phenix 06/06/2019, 8:45 AM

## 2019-06-06 NOTE — Progress Notes (Signed)
SAT 85% HFNC @90 

## 2019-06-06 NOTE — Progress Notes (Signed)
Occupational Therapy Session Note  Patient Details  Name: David Barton MRN: [id number] Date of Birth: 04/13/1933  Today's Date: 06/06/2019 OT Individual Time: 0913-0955 OT Individual Time Calculation (min): 42 min    Short Term Goals: Week 1:  OT Short Term Goal 1 (Week 1): STG = LTG due to length of stay  Skilled Therapeutic Interventions/Progress Updates:    Pt received supine on 8L high flow [place], with labored breathing and coughing often. Pt politely declining OOB mobility and initiated conversation re end of life decision. Provided pt with emotional support and therapeutic listening. Utilized motivational interviewing techniques for pt to consider which ADLs and IADLs are importance to him and to encourage him in his autonomous decision making in what his final weeks may look like. Encouraged pt to think about and write down goals and expectations if he chooses to decline dialysis. Pt was left supine with all needs met, bed alarm set.   Therapy Documentation Precautions:  Precautions Precautions: Fall, Other (comment) Precaution Comments: monitor SpO2 (4L-oxygen dependent baseline) Restrictions Weight Bearing Restrictions: No   Therapy/Group: Individual Therapy  Sandra H Davis 06/06/2019, 7:20 AM 

## 2019-06-07 ENCOUNTER — Inpatient Hospital Stay (HOSPITAL_COMMUNITY)
Admission: AD | Admit: 2019-06-07 | Discharge: 2019-06-15 | DRG: 291 | Disposition: A | Discharge status: Home Health | Payer: Medicare Other | Source: Ambulatory Visit | Attending: Student | Admitting: Student

## 2019-06-07 ENCOUNTER — Inpatient Hospital Stay (HOSPITAL_COMMUNITY): Payer: Medicare Other | Admitting: Physical Therapy

## 2019-06-07 ENCOUNTER — Inpatient Hospital Stay (HOSPITAL_COMMUNITY): Payer: Medicare Other | Admitting: Occupational Therapy

## 2019-06-07 ENCOUNTER — Inpatient Hospital Stay (HOSPITAL_COMMUNITY): Payer: Medicare Other | Admitting: Rehabilitation

## 2019-06-07 ENCOUNTER — Other Ambulatory Visit: Payer: Self-pay

## 2019-06-07 ENCOUNTER — Encounter (HOSPITAL_COMMUNITY): Payer: Self-pay

## 2019-06-07 DIAGNOSIS — G9341 Metabolic encephalopathy: Secondary | ICD-10-CM | POA: Diagnosis not present

## 2019-06-07 DIAGNOSIS — R531 Weakness: Secondary | ICD-10-CM | POA: Diagnosis not present

## 2019-06-07 DIAGNOSIS — Z66 Do not resuscitate: Secondary | ICD-10-CM | POA: Diagnosis present

## 2019-06-07 DIAGNOSIS — R0602 Shortness of breath: Secondary | ICD-10-CM

## 2019-06-07 DIAGNOSIS — I4821 Permanent atrial fibrillation: Secondary | ICD-10-CM | POA: Diagnosis present

## 2019-06-07 DIAGNOSIS — Z7901 Long term (current) use of anticoagulants: Secondary | ICD-10-CM | POA: Diagnosis not present

## 2019-06-07 DIAGNOSIS — R0600 Dyspnea, unspecified: Secondary | ICD-10-CM

## 2019-06-07 DIAGNOSIS — I5042 Chronic combined systolic (congestive) and diastolic (congestive) heart failure: Secondary | ICD-10-CM | POA: Diagnosis present

## 2019-06-07 DIAGNOSIS — J9611 Chronic respiratory failure with hypoxia: Secondary | ICD-10-CM | POA: Diagnosis not present

## 2019-06-07 DIAGNOSIS — I132 Hypertensive heart and chronic kidney disease with heart failure and with stage 5 chronic kidney disease, or end stage renal disease: Secondary | ICD-10-CM | POA: Diagnosis present

## 2019-06-07 DIAGNOSIS — I471 Supraventricular tachycardia: Secondary | ICD-10-CM | POA: Diagnosis present

## 2019-06-07 DIAGNOSIS — Z7951 Long term (current) use of inhaled steroids: Secondary | ICD-10-CM | POA: Diagnosis not present

## 2019-06-07 DIAGNOSIS — Z808 Family history of malignant neoplasm of other organs or systems: Secondary | ICD-10-CM

## 2019-06-07 DIAGNOSIS — J441 Chronic obstructive pulmonary disease with (acute) exacerbation: Secondary | ICD-10-CM | POA: Diagnosis present

## 2019-06-07 DIAGNOSIS — Z1159 Encounter for screening for other viral diseases: Secondary | ICD-10-CM | POA: Diagnosis not present

## 2019-06-07 DIAGNOSIS — F40240 Claustrophobia: Secondary | ICD-10-CM | POA: Diagnosis present

## 2019-06-07 DIAGNOSIS — N186 End stage renal disease: Secondary | ICD-10-CM | POA: Diagnosis present

## 2019-06-07 DIAGNOSIS — J9622 Acute and chronic respiratory failure with hypercapnia: Secondary | ICD-10-CM | POA: Diagnosis present

## 2019-06-07 DIAGNOSIS — J439 Emphysema, unspecified: Secondary | ICD-10-CM | POA: Diagnosis present

## 2019-06-07 DIAGNOSIS — J9621 Acute and chronic respiratory failure with hypoxia: Secondary | ICD-10-CM | POA: Diagnosis not present

## 2019-06-07 DIAGNOSIS — D631 Anemia in chronic kidney disease: Secondary | ICD-10-CM | POA: Diagnosis present

## 2019-06-07 DIAGNOSIS — Z515 Encounter for palliative care: Secondary | ICD-10-CM | POA: Diagnosis not present

## 2019-06-07 DIAGNOSIS — E876 Hypokalemia: Secondary | ICD-10-CM | POA: Diagnosis not present

## 2019-06-07 DIAGNOSIS — J449 Chronic obstructive pulmonary disease, unspecified: Secondary | ICD-10-CM | POA: Diagnosis not present

## 2019-06-07 DIAGNOSIS — N179 Acute kidney failure, unspecified: Secondary | ICD-10-CM | POA: Diagnosis not present

## 2019-06-07 DIAGNOSIS — Z79899 Other long term (current) drug therapy: Secondary | ICD-10-CM | POA: Diagnosis not present

## 2019-06-07 DIAGNOSIS — Z8701 Personal history of pneumonia (recurrent): Secondary | ICD-10-CM

## 2019-06-07 DIAGNOSIS — J90 Pleural effusion, not elsewhere classified: Secondary | ICD-10-CM | POA: Diagnosis not present

## 2019-06-07 DIAGNOSIS — D72829 Elevated white blood cell count, unspecified: Secondary | ICD-10-CM | POA: Diagnosis not present

## 2019-06-07 DIAGNOSIS — D539 Nutritional anemia, unspecified: Secondary | ICD-10-CM | POA: Diagnosis not present

## 2019-06-07 DIAGNOSIS — E872 Acidosis: Secondary | ICD-10-CM | POA: Diagnosis not present

## 2019-06-07 DIAGNOSIS — I10 Essential (primary) hypertension: Secondary | ICD-10-CM | POA: Diagnosis present

## 2019-06-07 DIAGNOSIS — I482 Chronic atrial fibrillation, unspecified: Secondary | ICD-10-CM | POA: Diagnosis not present

## 2019-06-07 DIAGNOSIS — Z87891 Personal history of nicotine dependence: Secondary | ICD-10-CM

## 2019-06-07 DIAGNOSIS — I5043 Acute on chronic combined systolic (congestive) and diastolic (congestive) heart failure: Secondary | ICD-10-CM | POA: Diagnosis not present

## 2019-06-07 DIAGNOSIS — J440 Chronic obstructive pulmonary disease with acute lower respiratory infection: Secondary | ICD-10-CM | POA: Diagnosis present

## 2019-06-07 DIAGNOSIS — N185 Chronic kidney disease, stage 5: Secondary | ICD-10-CM | POA: Diagnosis not present

## 2019-06-07 DIAGNOSIS — Z801 Family history of malignant neoplasm of trachea, bronchus and lung: Secondary | ICD-10-CM

## 2019-06-07 DIAGNOSIS — Z800 Family history of malignant neoplasm of digestive organs: Secondary | ICD-10-CM

## 2019-06-07 DIAGNOSIS — Z9849 Cataract extraction status, unspecified eye: Secondary | ICD-10-CM

## 2019-06-07 DIAGNOSIS — R5381 Other malaise: Secondary | ICD-10-CM | POA: Diagnosis not present

## 2019-06-07 DIAGNOSIS — Z85038 Personal history of other malignant neoplasm of large intestine: Secondary | ICD-10-CM

## 2019-06-07 DIAGNOSIS — N184 Chronic kidney disease, stage 4 (severe): Secondary | ICD-10-CM | POA: Diagnosis not present

## 2019-06-07 DIAGNOSIS — Z01810 Encounter for preprocedural cardiovascular examination: Secondary | ICD-10-CM | POA: Diagnosis not present

## 2019-06-07 DIAGNOSIS — Z9981 Dependence on supplemental oxygen: Secondary | ICD-10-CM | POA: Diagnosis not present

## 2019-06-07 DIAGNOSIS — Z992 Dependence on renal dialysis: Secondary | ICD-10-CM

## 2019-06-07 DIAGNOSIS — Z9889 Other specified postprocedural states: Secondary | ICD-10-CM

## 2019-06-07 DIAGNOSIS — I5023 Acute on chronic systolic (congestive) heart failure: Secondary | ICD-10-CM | POA: Diagnosis not present

## 2019-06-07 HISTORY — DX: End stage renal disease: N18.6

## 2019-06-07 LAB — PROTIME-INR
INR: 2 — ABNORMAL HIGH (ref 0.8–1.2)
Prothrombin Time: 22 seconds — ABNORMAL HIGH (ref 11.4–15.2)

## 2019-06-07 LAB — CBC
HCT: 28.7 % — ABNORMAL LOW (ref 39.0–52.0)
Hemoglobin: 8.6 g/dL — ABNORMAL LOW (ref 13.0–17.0)
MCH: 32.2 pg (ref 26.0–34.0)
MCHC: 30 g/dL (ref 30.0–36.0)
MCV: 107.5 fL — ABNORMAL HIGH (ref 80.0–100.0)
Platelets: 248 10*3/uL (ref 150–400)
RBC: 2.67 MIL/uL — ABNORMAL LOW (ref 4.22–5.81)
RDW: 17.8 % — ABNORMAL HIGH (ref 11.5–15.5)
WBC: 11.6 10*3/uL — ABNORMAL HIGH (ref 4.0–10.5)
nRBC: 0.4 % — ABNORMAL HIGH (ref 0.0–0.2)

## 2019-06-07 LAB — RENAL FUNCTION PANEL
Albumin: 2.5 g/dL — ABNORMAL LOW (ref 3.5–5.0)
Anion gap: 12 (ref 5–15)
BUN: 130 mg/dL — ABNORMAL HIGH (ref 8–23)
CO2: 29 mmol/L (ref 22–32)
Calcium: 8.9 mg/dL (ref 8.9–10.3)
Chloride: 101 mmol/L (ref 98–111)
Creatinine, Ser: 5.64 mg/dL — ABNORMAL HIGH (ref 0.61–1.24)
GFR calc Af Amer: 10 mL/min — ABNORMAL LOW (ref >60)
GFR calc non Af Amer: 9 mL/min — ABNORMAL LOW (ref >60)
Glucose, Bld: 108 mg/dL — ABNORMAL HIGH (ref 70–99)
Phosphorus: 7.1 mg/dL — ABNORMAL HIGH (ref 2.5–4.6)
Potassium: 4.3 mmol/L (ref 3.5–5.1)
Sodium: 142 mmol/L (ref 135–145)

## 2019-06-07 LAB — CULTURE, BODY FLUID W GRAM STAIN -BOTTLE: Culture: NO GROWTH

## 2019-06-07 LAB — APTT: aPTT: 38 seconds — ABNORMAL HIGH (ref 24–36)

## 2019-06-07 MED ORDER — DARBEPOETIN ALFA 150 MCG/0.3ML IJ SOSY: 150.0000 ug | PREFILLED_SYRINGE | INTRAMUSCULAR | Status: DC

## 2019-06-07 MED ORDER — BUDESONIDE 0.25 MG/2ML IN SUSP: 0.2500 mg | Freq: Two times a day (BID) | RESPIRATORY_TRACT | Status: DC

## 2019-06-07 MED ORDER — AMIODARONE HCL 200 MG PO TABS
200.0000 mg | ORAL_TABLET | Freq: Every day | ORAL | Status: DC
  Administered 2019-06-08 – 2019-06-15 (×8): 200 mg via ORAL
  Filled 2019-06-07 (×9): qty 1

## 2019-06-07 MED ORDER — IPRATROPIUM-ALBUTEROL 0.5-2.5 (3) MG/3ML IN SOLN
3.0000 mL | Freq: Two times a day (BID) | RESPIRATORY_TRACT | Status: DC
  Administered 2019-06-07 – 2019-06-15 (×14): 3 mL via RESPIRATORY_TRACT
  Filled 2019-06-07 (×16): qty 3

## 2019-06-07 MED ORDER — ALLOPURINOL 100 MG PO TABS
100.0000 mg | ORAL_TABLET | Freq: Every day | ORAL | Status: DC
  Administered 2019-06-08 – 2019-06-15 (×8): 100 mg via ORAL
  Filled 2019-06-07 (×8): qty 1

## 2019-06-07 MED ORDER — IPRATROPIUM-ALBUTEROL 0.5-2.5 (3) MG/3ML IN SOLN
3.0000 mL | RESPIRATORY_TRACT | Status: DC | PRN
  Administered 2019-06-10: 3 mL via RESPIRATORY_TRACT
  Filled 2019-06-07: qty 3

## 2019-06-07 MED ORDER — TRAZODONE HCL 50 MG PO TABS
25.0000 mg | ORAL_TABLET | Freq: Every evening | ORAL | Status: DC | PRN
  Filled 2019-06-07: qty 1

## 2019-06-07 MED ORDER — BUDESONIDE 0.25 MG/2ML IN SUSP
0.2500 mg | Freq: Two times a day (BID) | RESPIRATORY_TRACT | Status: DC
  Administered 2019-06-07 – 2019-06-13 (×11): 0.25 mg via RESPIRATORY_TRACT
  Filled 2019-06-07 (×12): qty 2

## 2019-06-07 MED ORDER — FUROSEMIDE 80 MG PO TABS
160.0000 mg | ORAL_TABLET | Freq: Two times a day (BID) | ORAL | Status: DC
  Administered 2019-06-07 – 2019-06-09 (×5): 160 mg via ORAL
  Filled 2019-06-07 (×5): qty 2

## 2019-06-07 MED ORDER — ACETAMINOPHEN 650 MG RE SUPP: 650.0000 mg | Freq: Four times a day (QID) | RECTAL | Status: DC | PRN

## 2019-06-07 MED ORDER — RENA-VITE PO TABS
1.0000 | ORAL_TABLET | Freq: Every day | ORAL | Status: DC
  Administered 2019-06-07 – 2019-06-14 (×8): 1 via ORAL
  Filled 2019-06-07 (×8): qty 1

## 2019-06-07 MED ORDER — GUAIFENESIN ER 600 MG PO TB12
600.0000 mg | ORAL_TABLET | Freq: Two times a day (BID) | ORAL | Status: DC
  Administered 2019-06-07 – 2019-06-15 (×16): 600 mg via ORAL
  Filled 2019-06-07 (×16): qty 1

## 2019-06-07 MED ORDER — RENA-VITE PO TABS: 1.0000 | ORAL_TABLET | Freq: Every day | ORAL | Status: DC

## 2019-06-07 MED ORDER — PROCHLORPERAZINE EDISYLATE 10 MG/2ML IJ SOLN
10.0000 mg | Freq: Four times a day (QID) | INTRAMUSCULAR | Status: DC | PRN
  Filled 2019-06-07: qty 2

## 2019-06-07 MED ORDER — ACETAMINOPHEN 325 MG PO TABS
650.0000 mg | ORAL_TABLET | Freq: Four times a day (QID) | ORAL | Status: DC | PRN
  Administered 2019-06-15: 650 mg via ORAL
  Filled 2019-06-07: qty 2

## 2019-06-07 MED ORDER — CAMPHOR-MENTHOL 0.5-0.5 % EX LOTN
TOPICAL_LOTION | CUTANEOUS | Status: DC | PRN
  Filled 2019-06-07: qty 222

## 2019-06-07 NOTE — Consult Note (Signed)
Chief Complaint: Patient was seen in consultation today for ESRD on HD/tunneled HD catheter placement.  Referring Physician(s): Lynnda Child  Supervising Physician: Corrie Mckusick  Patient Status: [name] - In-pt  History of Present Illness: David Barton is a 85 y.o. male with a past medical history of hypertension, HF, atrial fibrillation, COPD, emphysema, colon cancer, ESRD, alcohol abuse, and former tobacco use. He presented to [facility] ED 05/19/2019 with complaints of chest pain and dyspnea. In ED, CXR revealed bilateral airspace disease right greater than left with suspected pneumonia and volume overload. He was admitted for further management. At that time, patient CKD stage IV and patient had refused dialysis. During admission, he underwent two thoracentesis for fluid overload, first 05/21/2019 yielding 1.85 L and second 06/02/2019 yielding 1.2 L. He was discharged to [facility] 05/30/2019 due to deconditioning. During admission to CIR, patient found to have AKI superimposed on CKD stage IV with worsening renal function. Nephrology was consulted who recommended initiation of dialysis. Vascular surgery was consulted who recommended left arm AV fistula versus graft on Monday 06/11/2019 and IR consultation for possible tunneled HD catheter placement.  Patient awake and alert laying in bed. Complains of dyspnea, stable at this time. Denies fever, chills, chest pain, abdominal pain, or headache.  Patient is currently taking Eliquis 2.5 mg twice daily.   Past Medical History:  Diagnosis Date   Alcohol abuse    Colon cancer ([place])    COPD (chronic obstructive pulmonary disease) ([place])    Emphysema of lung ([place])    Former tobacco use    Hypertension    Peripheral edema    Sepsis ([place]) 10/2016   Aspiration PNA/C diff colitis    Past Surgical History:  Procedure Laterality Date   CARDIOVERSION N/A 03/16/2019   Procedure: CARDIOVERSION;  Surgeon: Pixie Casino, MD;  Location: [facility]  ENDOSCOPY;  Service: Cardiovascular;  Laterality: N/A;   CARDIOVERSION N/A 03/23/2019   Procedure: CARDIOVERSION;  Surgeon: Lelon Perla, MD;  Location: [place];  Service: Cardiovascular;  Laterality: N/A;   CATARACT EXTRACTION     COLON RESECTION     COLONOSCOPY     IMPLANTATION BONE ANCHORED HEARING AID Left 2016   IR THORACENTESIS ASP PLEURAL SPACE W/IMG GUIDE  05/21/2019   MINOR HEMORRHOIDECTOMY  1995   TEE WITHOUT CARDIOVERSION N/A 03/16/2019   Procedure: TRANSESOPHAGEAL ECHOCARDIOGRAM (TEE);  Surgeon: Pixie Casino, MD;  Location: [facility] ENDOSCOPY;  Service: Cardiovascular;  Laterality: N/A;   TONSILLECTOMY      Allergies: Flexeril [cyclobenzaprine]  Medications: Prior to Admission medications   Medication Sig Start Date End Date Taking? Authorizing Provider  allopurinol (ZYLOPRIM) 100 MG tablet Take 100 mg by mouth daily. 02/17/19   [provider]  amiodarone (PACERONE) 200 MG tablet Take 1 tablet (200 mg total) by mouth daily. 04/17/19   Erlene Quan, PA-C  apixaban (ELIQUIS) 2.5 MG TABS tablet Take 1 tablet (2.5 mg total) by mouth 2 (two) times daily. 05/30/19   Kayleen Memos, DO  bisoprolol (ZEBETA) 5 MG tablet Take 5 mg by mouth daily.    [provider]  budesonide (PULMICORT) 0.25 MG/2ML nebulizer solution Take 2 mLs (0.25 mg total) by nebulization 2 (two) times daily. 05/30/19   Kayleen Memos, DO  ferrous sulfate 325 (65 FE) MG tablet Take 325 mg by mouth daily.     [provider]  furosemide (LASIX) 40 MG tablet Take 1 tablet (40 mg total) by mouth 2 (two) times daily. 05/30/19  Irene Pap N, DO  ipratropium-albuterol (DUONEB) 0.5-2.5 (3) MG/3ML SOLN Use twice a day scheduled and every 4 hours as needed for shortness of breath and wheezing Patient not taking: Reported on 05/19/2019 03/26/19   Thurnell Lose, MD  Multiple Vitamin (MULTIVITAMIN WITH MINERALS) TABS tablet Take 1 tablet by mouth daily.    [provider]   OXYGEN Inhale 4 L into the lungs continuous.    [provider]     Family History  Problem Relation Age of Onset   Brain cancer Father    Colon cancer Mother        with mets to liver   Lung cancer Brother        was a smoker    Social History   Socioeconomic History   Marital status: Married    Spouse name: Not on file   Number of children: 3   Years of education: Not on file   Highest education level: Not on file  Occupational History   Occupation: Retired- Associate Professor  Social Designer, fashion/clothing strain: Not on file   Food insecurity    Worry: Patient refused    Inability: Patient refused   Transportation needs    Medical: Patient refused    Non-medical: Patient refused  Tobacco Use   Smoking status: Former Smoker    Packs/day: 1.00    Years: 20.00    Pack years: 20.00    Types: Cigarettes    Quit date: 12/20/1981    Years since quitting: 37.4   Smokeless tobacco: Never Used  Substance and Sexual Activity   Alcohol use: Yes    Alcohol/week: 7.0 standard drinks    Types: 7 Standard drinks or equivalent per week    Comment: states he drinks 3-4 days a week, "a couple" on those occasions but does not further quaniity.   Drug use: No   Sexual activity: Never  Lifestyle   Physical activity    Days per week: Patient refused    Minutes per session: Patient refused   Stress: Not on file  Relationships   Social connections    Talks on phone: Patient refused    Gets together: Patient refused    Attends religious service: Patient refused    Active member of club or organization: Patient refused    Attends meetings of clubs or organizations: Patient refused    Relationship status: Patient refused  Other Topics Concern   Not on file  Social History Narrative   Not on file     Review of Systems: A 12 point ROS discussed and pertinent positives are indicated in the HPI above.  All other systems are  negative.  Review of Systems  Constitutional: Negative for chills and fever.  Respiratory: Positive for shortness of breath. Negative for wheezing.   Cardiovascular: Negative for chest pain and palpitations.  Gastrointestinal: Negative for abdominal pain.  Neurological: Negative for headaches.  Psychiatric/Behavioral: Negative for behavioral problems and confusion.    Vital Signs: BP (!) 103/57 (BP Location: Right Arm)    Pulse 75    Temp 98 F (36.7 C) (Oral)    Resp (!) 24    Ht 5\' 10"  (1.778 m)    Wt 231 lb 7.7 oz (105 kg)    SpO2 92%    BMI 33.21 kg/m   Physical Exam       Imaging: Dg Chest 1 View  Result Date: 06/02/2019 CLINICAL DATA:  Pleural effusion. Status post thoracentesis.  EXAM: CHEST  1 VIEW COMPARISON:  06/01/2019 FINDINGS: Small right pleural effusion shows decrease in size since previous study. No pneumothorax visualized. Atelectasis or infiltrate is again seen in both lung bases. Mild scarring is also noted in the lower lung zones bilaterally. Stable cardiomegaly. IMPRESSION: 1. Decreased size of small right pleural effusion. No pneumothorax visualized. 2. Bibasilar atelectasis versus infiltrates, without significant change. 3. Stable cardiomegaly. Electronically Signed   By: Earle Gell M.D.   On: 06/02/2019 11:54   Dg Chest 1 View  Result Date: 05/21/2019 CLINICAL DATA:  Status post right thoracentesis EXAM: CHEST  1 VIEW COMPARISON:  05/20/2019 CT FINDINGS: Cardiac shadow is enlarged but stable. Mild atelectasis is noted in the right mid and lower lung. No pneumothorax is noted following thoracentesis. No bony abnormality is seen. IMPRESSION: No pneumothorax following right-sided thoracentesis. Mild atelectatic changes on the right. Electronically Signed   By: Inez Catalina M.D.   On: 05/21/2019 14:30   Dg Chest 2 View  Result Date: 06/04/2019 CLINICAL DATA:  Shortness of breath and weakness. EXAM: CHEST - 2 VIEW COMPARISON:  Radiographs 06/02/2019 and 06/01/2019.   CT 05/20/2019. FINDINGS: Stable cardiac enlargement. There has been no significant change in the right-greater-than-left pleural effusions and associated bibasilar airspace opacities. There is no pneumothorax or pulmonary edema. The bones appear unchanged. IMPRESSION: Unchanged residual small pleural effusions and bibasilar airspace opacities. No new findings. Electronically Signed   By: Richardean Sale M.D.   On: 06/04/2019 11:28   Dg Chest 2 View  Result Date: 06/01/2019 CLINICAL DATA:  Shortness of breath and weakness. EXAM: CHEST - 2 VIEW COMPARISON:  May 31, 2019 FINDINGS: Effusion and opacity in the right base is stable. A small effusion with underlying atelectasis in the left base is stable. The cardiomediastinal silhouette is unchanged. No pneumothorax. No other changes. IMPRESSION: Right greater than left pleural effusions with underlying atelectasis. No other interval changes or acute abnormalities. Electronically Signed   By: Dorise Bullion III M.D   On: 06/01/2019 12:20   Dg Chest 2 View  Result Date: 05/31/2019 CLINICAL DATA:  Lung infiltrate EXAM: CHEST - 2 VIEW COMPARISON:  Six hundred eleven 2020, 05/23/2019, CT 05/20/2019 FINDINGS: Small left pleural effusion and moderate right pleural effusion without significant change. Persistent consolidation at the right middle lobe and right greater than left lung base. Enlarged cardiomediastinal silhouette with central congestion. Aortic atherosclerosis. No pneumothorax. IMPRESSION: No significant interval change in moderate right and small left pleural effusion. Continued airspace consolidation at the right middle lobe and right greater than left lung base. Electronically Signed   By: Donavan Foil M.D.   On: 05/31/2019 19:16   Dg Chest 2 View  Result Date: 05/31/2019 CLINICAL DATA:  Hypoxia EXAM: CHEST - 2 VIEW COMPARISON:  05/23/2019 FINDINGS: Cardiac shadow is enlarged. Enlarging right-sided pleural effusion is noted with likely underlying  atelectasis/infiltrate. Small left effusion is noted. No other focal abnormality is noted. IMPRESSION: Bilateral pleural effusions right greater than left increased when compared with the prior exam particularly on the right. Electronically Signed   By: Inez Catalina M.D.   On: 05/31/2019 12:34   Ct Chest Wo Contrast  Result Date: 05/20/2019 CLINICAL DATA:  Unresolved pneumonia EXAM: CT CHEST WITHOUT CONTRAST TECHNIQUE: Multidetector CT imaging of the chest was performed following the standard protocol without IV contrast. COMPARISON:  03/19/2019 FINDINGS: Cardiovascular: Atherosclerotic calcifications of the aortic arch, great vessels, and coronary arteries are noted. No evidence of aortic aneurysm. Mediastinum/Nodes: No abnormal mediastinal  adenopathy. Visualized thyroid is unremarkable. Small pericardial effusion is new. Lungs/Pleura: Left lower lobe consolidation and left pleural effusion are improved. Right lower lobe consolidation has markedly worsened. A moderate right pleural effusion has worsened. There is also patchy airspace disease throughout the right upper lobe. Subsegmental atelectasis in the right middle lobe. No pneumothorax. Centrilobular emphysema towards the lung apices. Upper Abdomen: No acute abnormality. Musculoskeletal: No vertebral compression deformity. IMPRESSION: Marked worsening consolidation in the right upper and lower lobes. Improved left lower lobe consolidation and left pleural effusion Worsening right pleural effusion which is moderate. New small pericardial effusion. Aortic Atherosclerosis (ICD10-I70.0) and Emphysema (ICD10-J43.9). Electronically Signed   By: Marybelle Killings M.D.   On: 05/20/2019 13:18   US Renal  Result Date: 06/02/2019 CLINICAL DATA:  Acute kidney injury EXAM: RENAL / URINARY TRACT ULTRASOUND COMPLETE COMPARISON:  03/04/2019 FINDINGS: Right Kidney: Renal measurements: 11.3 x 6.1 x 6.1 cm = volume: 216 mL. Increased renal echogenicity. No hydronephrosis.  Upper pole hypoechoic cortical complex cyst measures 1.4 cm. Left Kidney: Renal measurements: 9.6 x 6.3 x 6.3 cm = volume: 199 mL. Increased renal echogenicity. No hydronephrosis. No acute finding by ultrasound. Bladder: Diffuse bladder wall thickening, this may be related to underdistention. IMPRESSION: Increased renal echogenicity compatible with medical renal disease. Suspect small minimally complex right renal cortical cyst measuring 1.4 cm Nonspecific bladder wall thickening Electronically Signed   By: Jerilynn Mages.  Shick M.D.   On: 06/02/2019 14:30   Dg Chest Port 1 View  Result Date: 05/23/2019 CLINICAL DATA:  Right-sided pleural effusion EXAM: PORTABLE CHEST 1 VIEW COMPARISON:  05/21/2019 FINDINGS: Cardiac shadow is stable. Thickening is noted along the minor fissure on the right stable from the prior exam. Mild right basilar atelectasis is seen. No new focal abnormality is noted. No pneumothorax is seen. IMPRESSION: Stable changes on the right. No pneumothorax or sizable effusion is seen. Electronically Signed   By: Inez Catalina M.D.   On: 05/23/2019 07:54   Dg Chest Port 1 View  Result Date: 05/20/2019 CLINICAL DATA:  Pneumonia EXAM: PORTABLE CHEST 1 VIEW COMPARISON:  05/19/2019 FINDINGS: Small to moderate right pleural effusion, similar. Associated right lower lobe opacity, likely atelectasis. Trace left pleural effusion. Left lung is otherwise clear. No pneumothorax is seen. Cardiomegaly. IMPRESSION: Small to moderate right pleural effusion and trace left pleural effusion, grossly unchanged. Associated right lower lobe opacity, likely atelectasis. Electronically Signed   By: Julian Hy M.D.   On: 05/20/2019 05:05   Dg Chest Portable 1 View  Result Date: 05/19/2019 CLINICAL DATA:  Chest pain EXAM: PORTABLE CHEST 1 VIEW COMPARISON:  05/04/2019 FINDINGS: There is increasing opacity at the right lung base with shift of the mediastinum to the right most consistent with increasing collapse in the right  lower lobe. Heterogeneous opacities throughout the right lung are stable. Increasing heterogeneous opacities at the left base. Right pleural effusion is suspected. IMPRESSION: Increasing collapse in the right lower lobe. Stable opacities throughout the right lung. Increasing pulmonary opacities at the left lung base. Small right pleural effusion is suspected. Electronically Signed   By: Marybelle Killings M.D.   On: 05/19/2019 10:13   Dg Swallowing Func-speech Pathology  Result Date: 05/21/2019 Objective Swallowing Evaluation: Type of Study: MBS-Modified Barium Swallow Study  Patient Details Name: LONDEN BOK MRN: [id number] Date of Birth: [birth year] Today's Date: 05/21/2019 Time: SLP Start Time (ACUTE ONLY): 1300 -SLP Stop Time (ACUTE ONLY): 1315 SLP Time Calculation (min) (ACUTE ONLY): 15 min Past Medical History: Past  Medical History: Diagnosis Date  Alcohol abuse   Colon cancer ([place])   COPD (chronic obstructive pulmonary disease) ([place])   Emphysema of lung ([place])   Former tobacco use   Hypertension   Peripheral edema   Sepsis ([place]) 10/2016  Aspiration PNA/C diff colitis Past Surgical History: Past Surgical History: Procedure Laterality Date  CARDIOVERSION N/A 03/16/2019  Procedure: CARDIOVERSION;  Surgeon: Pixie Casino, MD;  Location: [facility] ENDOSCOPY;  Service: Cardiovascular;  Laterality: N/A;  CARDIOVERSION N/A 03/23/2019  Procedure: CARDIOVERSION;  Surgeon: Lelon Perla, MD;  Location: [facility] ENDOSCOPY;  Service: Cardiovascular;  Laterality: N/A;  CATARACT EXTRACTION    COLON RESECTION    COLONOSCOPY    IMPLANTATION BONE ANCHORED HEARING AID Left 2016  MINOR HEMORRHOIDECTOMY  1995  TEE WITHOUT CARDIOVERSION N/A 03/16/2019  Procedure: TRANSESOPHAGEAL ECHOCARDIOGRAM (TEE);  Surgeon: Pixie Casino, MD;  Location: [facility] ENDOSCOPY;  Service: Cardiovascular;  Laterality: N/A;  TONSILLECTOMY   HPI: Patient is an 85 y.o. male with PMH: COPD: oxygen dependent on 4L at home, atrial fibrillation on Xarelto,  diastolic heart failure, cardiomyopathy, chronic kidney disease, colon cancer, ETOH abuse, who also had a prolonged hospitalization from March to April for septic shock. He arrived at the hospital on 5/30 with mid-sternal chest pain radiating to the neck and 2 week h/o worsening breathing. CXR revealed RLL collapse, mediastinal shift to the right, stable opacities in right lung, increasing opacities in LL base.  Repeat CXR revealed small to moderate right pleural effusion and trace left pleural effusion.  Subjective: pleasant, no c/o swallow problems Assessment / Plan / Recommendation CHL IP CLINICAL IMPRESSIONS 05/21/2019 Clinical Impression Patient presents with an oropharyngeal swallow that is WFL-WNL without aspiration or penetration even when taking large volume sips of thin liquids via cup and straw sips. He exhbited a trace amount of vallecular residuals, but these fully cleared with spontaneous swallowing.  SLP Visit Diagnosis Dysphagia, unspecified (R13.10) Attention and concentration deficit following -- Frontal lobe and executive function deficit following -- Impact on safety and function No limitations   CHL IP TREATMENT RECOMMENDATION 05/21/2019 Treatment Recommendations No treatment recommended at this time   Prognosis 05/21/2019 Prognosis for Safe Diet Advancement Good Barriers to Reach Goals -- Barriers/Prognosis Comment -- CHL IP DIET RECOMMENDATION 05/21/2019 SLP Diet Recommendations Regular solids;Thin liquid Liquid Administration via Cup;Straw Medication Administration Whole meds with liquid Compensations -- Postural Changes --   No flowsheet data found.  CHL IP FOLLOW UP RECOMMENDATIONS 05/21/2019 Follow up Recommendations None   No flowsheet data found.     CHL IP ORAL PHASE 05/21/2019 Oral Phase WFL Oral - Pudding Teaspoon -- Oral - Pudding Cup -- Oral - Honey Teaspoon -- Oral - Honey Cup -- Oral - Nectar Teaspoon -- Oral - Nectar Cup -- Oral - Nectar Straw -- Oral - Thin Teaspoon -- Oral - Thin Cup --  Oral - Thin Straw -- Oral - Puree -- Oral - Mech Soft -- Oral - Regular -- Oral - Multi-Consistency -- Oral - Pill -- Oral Phase - Comment --  CHL IP PHARYNGEAL PHASE 05/21/2019 Pharyngeal Phase WFL Pharyngeal- Pudding Teaspoon -- Pharyngeal -- Pharyngeal- Pudding Cup -- Pharyngeal -- Pharyngeal- Honey Teaspoon -- Pharyngeal -- Pharyngeal- Honey Cup -- Pharyngeal -- Pharyngeal- Nectar Teaspoon -- Pharyngeal -- Pharyngeal- Nectar Cup -- Pharyngeal -- Pharyngeal- Nectar Straw -- Pharyngeal -- Pharyngeal- Thin Teaspoon -- Pharyngeal -- Pharyngeal- Thin Cup -- Pharyngeal -- Pharyngeal- Thin Straw -- Pharyngeal -- Pharyngeal- Puree -- Pharyngeal -- Pharyngeal- Mechanical Soft -- Pharyngeal --  Pharyngeal- Regular -- Pharyngeal -- Pharyngeal- Multi-consistency -- Pharyngeal -- Pharyngeal- Pill -- Pharyngeal -- Pharyngeal Comment --  CHL IP CERVICAL ESOPHAGEAL PHASE 05/21/2019 Cervical Esophageal Phase WFL Pudding Teaspoon -- Pudding Cup -- Honey Teaspoon -- Honey Cup -- Nectar Teaspoon -- Nectar Cup -- Nectar Straw -- Thin Teaspoon -- Thin Cup -- Thin Straw -- Puree -- Mechanical Soft -- Regular -- Multi-consistency -- Pill -- Cervical Esophageal Comment -- [name] 05/21/2019, 1:47 PM Sonia Baller, MA, CCC-SLP Speech Therapy [facility] Acute Rehab Pager: [phone]              Ir Thoracentesis Asp Pleural Space W/img Guide  Result Date: 05/21/2019 INDICATION: Patient with history of COPD, emphysema, acute on chronic hypoxic and hypercapnic respiratory failure, and right pleural effusion. Request is made for diagnostic and therapeutic right thoracentesis. EXAM: ULTRASOUND GUIDED DIAGNOSTIC AND THERAPEUTIC RIGHT THORACENTESIS MEDICATIONS: 10 mL 1% lidocaine COMPLICATIONS: None immediate. PROCEDURE: An ultrasound guided thoracentesis was thoroughly discussed with the patient and questions answered. The benefits, risks, alternatives and complications were also discussed. The patient understands and wishes to proceed with  the procedure. Written consent was obtained. Ultrasound was performed to localize and mark an adequate pocket of fluid in the right chest. The area was then prepped and draped in the normal sterile fashion. 1% Lidocaine was used for local anesthesia. Under ultrasound guidance a 6 Fr Safe-T-Centesis catheter was introduced. Thoracentesis was performed. The catheter was removed and a dressing applied. FINDINGS: A total of approximately 1.85 L of dark red fluid was removed. Samples were sent to the laboratory as requested by the clinical team. IMPRESSION: Successful ultrasound guided right thoracentesis yielding 1.85 L of pleural fluid. Read by: Earley Abide, PA-C Electronically Signed   By: Jerilynn Mages.  Shick M.D.   On: 05/21/2019 14:31   US Thoracentesis Asp Pleural Space W/img Guide  Result Date: 06/02/2019 INDICATION: Patient with history of colon cancer, COPD, CHF, chronic kidney disease, dyspnea, recurrent right pleural effusion. Request made for diagnostic and therapeutic right thoracentesis. EXAM: ULTRASOUND GUIDED DIAGNOSTIC AND THERAPEUTIC RIGHT THORACENTESIS MEDICATIONS: None COMPLICATIONS: None immediate. PROCEDURE: An ultrasound guided thoracentesis was thoroughly discussed with the patient and questions answered. The benefits, risks, alternatives and complications were also discussed. The patient understands and wishes to proceed with the procedure. Written consent was obtained. Ultrasound was performed to localize and mark an adequate pocket of fluid in the right chest. The area was then prepped and draped in the normal sterile fashion. 1% Lidocaine was used for local anesthesia. Under ultrasound guidance a 6 Fr Safe-T-Centesis catheter was introduced. Thoracentesis was performed. The catheter was removed and a dressing applied. FINDINGS: A total of approximately 1.4 liters of blood-tinged fluid was removed. Samples were sent to the laboratory as requested by the clinical team. Due to patient hypotension  only the above amount of fluid was removed today. IMPRESSION: Successful ultrasound guided diagnostic and therapeutic right thoracentesis yielding 1.4 liters of pleural fluid. Read by: Rowe Robert, PA-C Electronically Signed   By: Jerilynn Mages.  Shick M.D.   On: 06/02/2019 10:46    Labs:  CBC: Recent Labs    05/22/19 0307 05/23/19 0330 05/23/19 0341 05/24/19 0454 05/31/19 0346  WBC 9.7 7.4  --  6.8 11.7*  HGB 8.1* 7.9* 7.8* 8.5* 9.3*  HCT 27.6* 26.4* 23.0* 28.7* 31.8*  PLT 198 197  --  221 262    COAGS: Recent Labs    03/04/19 0329 03/16/19 0757  05/22/19 0307 05/22/19 1923 05/23/19 0330 05/24/19  0454  INR 1.2 2.3*  --   --   --   --   --   APTT  --   --    < > 61* 71* 71* 63*   < > = values in this interval not displayed.    BMP: Recent Labs    06/04/19 0616 06/04/19 1222 06/06/19 1301 06/07/19 0746  NA 139 138 139 142  K 5.4* 5.9* 4.4 4.3  CL 100 98 99 101  CO2 25 27 25 29   GLUCOSE 106* 126* 146* 108*  BUN 117* 120* 132* 130*  CALCIUM 8.8* 8.9 8.9 8.9  CREATININE 5.61* 5.79* 5.81* 5.64*  GFRNONAA 9* 8* 8* 9*  GFRAA 10* 10* 10* 10*    LIVER FUNCTION TESTS: Recent Labs    03/03/19 0947 03/17/19 0317  05/21/19 0524 05/31/19 0346 06/04/19 1222 06/06/19 1301 06/07/19 0746  BILITOT 0.6 0.6  --  0.5 0.5  --   --   --   AST 24 20  --  14* 14*  --   --   --   ALT 10 20  --  10 12  --   --   --   ALKPHOS 75 51  --  50 58  --   --   --   PROT 7.0 5.1*  --  6.0* 6.3*  --   --   --   ALBUMIN 2.5* 2.3*   < > 2.3* 2.7* 2.6* 2.6* 2.5*   < > = values in this interval not displayed.     Assessment and Plan:  ESRD on HD. Plan for image-guided tunneled HD catheter placement tentatively for tomorrow 06/08/2019 with Dr. Anselm Pancoast. Patient will be NPO at midnight. Afebrile. Patient is currently taking Eliquis- per Dr. Anselm Pancoast hold today and tomorrow and plan for procedure tomorrow, RN aware to hold Eliquis. INR pending.  Risks and benefits discussed with the patient  including, but not limited to bleeding, infection, vascular injury, pneumothorax which may require chest tube placement, air embolism or even death. All of the patient's questions were answered, patient is agreeable to proceed. Consent signed and in chart.   Thank you for this interesting consult.  I greatly enjoyed meeting EBER FERRUFINO and look forward to participating in their care.  A copy of this report was sent to the requesting provider on this date.  Electronically Signed: Earley Abide, PA-C 06/07/2019, 3:22 PM   I spent a total of 40 Minutes in face to face in clinical consultation, greater than 50% of which was counseling/coordinating care for ESRD on HD/tunneled HD catheter placement.

## 2019-06-07 NOTE — Progress Notes (Signed)
Bed ready, waiting d/c order

## 2019-06-07 NOTE — Progress Notes (Signed)
Occupational Therapy Note  Patient Details  Name: David Barton MRN: [id number] Date of Birth: 1933/03/24  Pt's plan of care adjusted to 15/7 after speaking with care team and discussed with PA - Pam Love  as pt currently unable to tolerate current therapy schedule with OT, PT, and current medical issues.    [name] 06/07/2019, 12:47 PM

## 2019-06-07 NOTE — Consult Note (Signed)
Hospital Consult    Reason for Consult:  In need of HD access/TDC Requesting Physician:  Deterding MRN #:  [id number]  History of Present Illness: This is a 85 y.o. male who was admitted to the hospital at the end of May with respiratory failure.  He was on 4LO2NC at home.  He has hx of COPD, CHF with EF of 30% in 2017, which has since normalized. He also has hx of afib with hx of cardioversion, HTN, CKD IV, etoh abuse, colon cancer.  He had a prolonged hospitalization in March/April for PNA/septic shock.    Pt was discharged to CIR due to deconditioning.    Pt had an AKI on CKD IV.  He originally did not want to consider dialysis, but now ready to consider it.    The pt is not on a statin for cholesterol management.  The pt is not on a daily aspirin.   Other AC:  Eliquis The pt is not on meds for hypertension.   The pt is not diabetic.   Tobacco hx:  Remote; quit 1983  Past Medical History:  Diagnosis Date  . Alcohol abuse   . Colon cancer ([place])   . COPD (chronic obstructive pulmonary disease) ([place])   . Emphysema of lung ([place])   . Former tobacco use   . Hypertension   . Peripheral edema   . Sepsis ([place]) 10/2016   Aspiration PNA/C diff colitis    Past Surgical History:  Procedure Laterality Date  . CARDIOVERSION N/A 03/16/2019   Procedure: CARDIOVERSION;  Surgeon: Pixie Casino, MD;  Location: [facility] ENDOSCOPY;  Service: Cardiovascular;  Laterality: N/A;  . CARDIOVERSION N/A 03/23/2019   Procedure: CARDIOVERSION;  Surgeon: Lelon Perla, MD;  Location: [facility] ENDOSCOPY;  Service: Cardiovascular;  Laterality: N/A;  . CATARACT EXTRACTION    . COLON RESECTION    . COLONOSCOPY    . IMPLANTATION BONE ANCHORED HEARING AID Left 2016  . IR THORACENTESIS ASP PLEURAL SPACE W/IMG GUIDE  05/21/2019  . MINOR HEMORRHOIDECTOMY  1995  . TEE WITHOUT CARDIOVERSION N/A 03/16/2019   Procedure: TRANSESOPHAGEAL ECHOCARDIOGRAM (TEE);  Surgeon: Pixie Casino, MD;  Location: [facility] ENDOSCOPY;  Service:  Cardiovascular;  Laterality: N/A;  . TONSILLECTOMY      Allergies  Allergen Reactions  . Flexeril [Cyclobenzaprine] Other (See Comments)    Unknown reaction     Prior to Admission medications   Medication Sig Start Date End Date Taking? Authorizing Provider  allopurinol (ZYLOPRIM) 100 MG tablet Take 100 mg by mouth daily. 02/17/19   [provider]  amiodarone (PACERONE) 200 MG tablet Take 1 tablet (200 mg total) by mouth daily. 04/17/19   Erlene Quan, PA-C  apixaban (ELIQUIS) 2.5 MG TABS tablet Take 1 tablet (2.5 mg total) by mouth 2 (two) times daily. 05/30/19   Kayleen Memos, DO  bisoprolol (ZEBETA) 5 MG tablet Take 5 mg by mouth daily.    [provider]  budesonide (PULMICORT) 0.25 MG/2ML nebulizer solution Take 2 mLs (0.25 mg total) by nebulization 2 (two) times daily. 05/30/19   Kayleen Memos, DO  ferrous sulfate 325 (65 FE) MG tablet Take 325 mg by mouth daily.     [provider]  furosemide (LASIX) 40 MG tablet Take 1 tablet (40 mg total) by mouth 2 (two) times daily. 05/30/19   Kayleen Memos, DO  ipratropium-albuterol (DUONEB) 0.5-2.5 (3) MG/3ML SOLN Use twice a day scheduled and every 4 hours as needed for shortness of breath  and wheezing Patient not taking: Reported on 05/19/2019 03/26/19   Thurnell Lose, MD  Multiple Vitamin (MULTIVITAMIN WITH MINERALS) TABS tablet Take 1 tablet by mouth daily.    [provider]  OXYGEN Inhale 4 L into the lungs continuous.    [provider]    Social History   Socioeconomic History  . Marital status: Married    Spouse name: Not on file  . Number of children: 3  . Years of education: Not on file  . Highest education level: Not on file  Occupational History  . Occupation: Retired- Associate Professor  Social Needs  . Financial resource strain: Not on file  . Food insecurity    Worry: Patient refused    Inability: Patient refused  . Transportation needs    Medical: Patient  refused    Non-medical: Patient refused  Tobacco Use  . Smoking status: Former Smoker    Packs/day: 1.00    Years: 20.00    Pack years: 20.00    Types: Cigarettes    Quit date: 12/20/1981    Years since quitting: 37.4  . Smokeless tobacco: Never Used  Substance and Sexual Activity  . Alcohol use: Yes    Alcohol/week: 7.0 standard drinks    Types: 7 Standard drinks or equivalent per week    Comment: states he drinks 3-4 days a week, "a couple" on those occasions but does not further quaniity.  . Drug use: No  . Sexual activity: Never  Lifestyle  . Physical activity    Days per week: Patient refused    Minutes per session: Patient refused  . Stress: Not on file  Relationships  . Social Herbalist on phone: Patient refused    Gets together: Patient refused    Attends religious service: Patient refused    Active member of club or organization: Patient refused    Attends meetings of clubs or organizations: Patient refused    Relationship status: Patient refused  . Intimate partner violence    Fear of current or ex partner: Patient refused    Emotionally abused: Patient refused    Physically abused: Patient refused    Forced sexual activity: Patient refused  Other Topics Concern  . Not on file  Social History Narrative  . Not on file     Family History  Problem Relation Age of Onset  . Brain cancer Father   . Colon cancer Mother        with mets to liver  . Lung cancer Brother        was a smoker    ROS: [x]  Positive   [ ]  Negative   [ ]  All sytems reviewed and are negative  Cardiac: []  chest pain/pressure []  palpitations [x]  SOB [x]  DOE  Vascular: []  pain in legs while walking []  pain in legs at rest []  pain in legs at night []  non-healing ulcers []  hx of DVT []  swelling in legs  Pulmonary: [x]  asthma/wheezing [x]  home O2  Neurologic: []  hx of CVA []  mini stroke  Hematologic: [x]  hx of cancer-colon  Endocrine:   []  diabetes []  thyroid  disease  GI []  GERD  GU: [x]  CKD/renal failure []  HD--[]  M/W/F or []  T/T/S  Psychiatric: []  anxiety []  depression  Musculoskeletal: []  arthritis []  joint pain  Integumentary: []  rashes []  ulcers  Constitutional: []  fever []  chills   Physical Examination  Vitals:   06/07/19 0258 06/07/19 0803  BP: 130/88   Pulse:  84 88  Resp: 17 (!) 22  Temp: 97.9 F (36.6 C)   SpO2:  91%   Body mass index is 33.21 kg/m.  General:  nad HENT: WNL, normocephalic Pulmonary: mild labored breathing Cardiac: palpable radial pulses bilaterally Abdomen:  soft, NT/ND, no masses Extremities: iv left forearm Neurologic: A&O X 3;    CBC    Component Value Date/Time   WBC 11.7 (H) 05/31/2019 0346   RBC 2.90 (L) 05/31/2019 0346   HGB 9.3 (L) 05/31/2019 0346   HGB 14.1 12/21/2018 1038   HGB 14.9 11/24/2009 0958   HCT 31.8 (L) 05/31/2019 0346   HCT 36.6 (L) 03/10/2019 1821   HCT 43.8 11/24/2009 0958   PLT 262 05/31/2019 0346   PLT 276 12/21/2018 1038   MCV 109.7 (H) 05/31/2019 0346   MCV 101 (H) 12/21/2018 1038   MCV 108.6 (H) 11/24/2009 0958   MCH 32.1 05/31/2019 0346   MCHC 29.2 (L) 05/31/2019 0346   RDW 16.5 (H) 05/31/2019 0346   RDW 12.5 12/21/2018 1038   RDW 15.4 (H) 11/24/2009 0958   LYMPHSABS 1.1 05/31/2019 0346   LYMPHSABS 2.2 11/24/2009 0958   MONOABS 1.4 (H) 05/31/2019 0346   MONOABS 0.6 11/24/2009 0958   EOSABS 0.2 05/31/2019 0346   EOSABS 0.2 11/24/2009 0958   BASOSABS 0.0 05/31/2019 0346   BASOSABS 0.0 11/24/2009 0958    BMET    Component Value Date/Time   NA 142 06/07/2019 0746   NA 139 12/21/2018 1045   K 4.3 06/07/2019 0746   CL 101 06/07/2019 0746   CO2 29 06/07/2019 0746   GLUCOSE 108 (H) 06/07/2019 0746   BUN 130 (H) 06/07/2019 0746   BUN 35 (H) 12/21/2018 1045   CREATININE 5.64 (H) 06/07/2019 0746   CALCIUM 8.9 06/07/2019 0746   GFRNONAA 9 (L) 06/07/2019 0746   GFRAA 10 (L) 06/07/2019 0746    COAGS: Lab Results  Component Value Date    INR 2.3 (H) 03/16/2019   INR 1.2 03/04/2019     Non-Invasive Vascular Imaging:   ordered   ASSESSMENT/PLAN: This is a 85 y.o. male now with end-stage renal disease.  Unfortunately cannot have procedure until Monday given OR availability.  Will need tunneled dialysis catheter with interventional radiology if dialysis necessary prior to that.  We will plan left arm AV fistula versus graft pending vein mapping.  We will need to hold Eliquis this weekend in preparation for surgery Monday.  Please make n.p.o. past midnight Sunday night.   Leontine Locket, PA-C [facility]. Donzetta Matters, MD [facility] Office: [phone] Pager: [phone]

## 2019-06-07 NOTE — Progress Notes (Signed)
Subjective: Interval History: has no complaints, wants to go through with dialysis.  Objective: Vital signs in last 24 hours: Temp:  [97.9 F (36.6 C)-98.2 F (36.8 C)] 97.9 F (36.6 C) (06/18 0258) Pulse Rate:  [74-88] 88 (06/18 0803) Resp:  [17-22] 22 (06/18 0803) BP: (108-130)/(68-92) 130/88 (06/18 0258) SpO2:  [88 %-96 %] 91 % (06/18 0803) Weight:  [105 kg] 105 kg (06/18 0258) Weight change: 1.2 kg  Intake/Output from previous day: 06/17 0701 - 06/18 0700 In: 480 [P.O.:480] Out: 1300 [Urine:1300] Intake/Output this shift: No intake/output data recorded.  General appearance: alert, cooperative, no distress, moderately obese and pale Resp: diminished breath sounds bilaterally, rhonchi bibasilar and wheezes bilaterally Cardio: irregularly irregular rhythm, S1, S2 normal and systolic murmur: systolic ejection 2/6, crescendo and decrescendo at 2nd left intercostal space GI: obese, pos bs,  Extremities: edema 2+  Lab Results: No results for input(s): WBC, HGB, HCT, PLT in the last 72 hours. BMET:  Recent Labs    06/06/19 1301 06/07/19 0746  NA 139 142  K 4.4 4.3  CL 99 101  CO2 25 29  GLUCOSE 146* 108*  BUN 132* 130*  CREATININE 5.81* 5.64*  CALCIUM 8.9 8.9   No results for input(s): PTH in the last 72 hours. Iron Studies: No results for input(s): IRON, TIBC, TRANSFERRIN, FERRITIN in the last 72 hours.  Studies/Results: No results found.  I have reviewed the patient's current medications.  Assessment/Plan: 1 ESRD needs PC and perm access. Vol xs. Will plan CLIP 2 Anemia Fe, and esa. 3 HPTh stable 4 CM stable 5 COPD needs bronchodilators 6 Obesity 7 Afib P Pc, HD, perm access, counsel, esa.   LOS: 8 days   Jeneen Rinks Fabyan Loughmiller 06/07/2019,10:46 AM

## 2019-06-07 NOTE — H&P (Signed)
History and Physical    David Barton NLZ:[id number] DOB: [birth year] DOA: (Not on file)  Referring MD/NP/PA: Reesa Chew, PA-C PCP: Maury Dus, MD  Patient coming from: Transfer from inpatient rehab  Chief Complaint: Need of dialysis  I have personally briefly reviewed patient's old medical records in [facility]   HPI: David Barton is a 85 y.o. male with medical history significant of COPD/emphysema, chronic respiratory failure oxygen dependent A. fib/PSVT with history of cardioversion, HTN, systolic CHF with EF 90%, ESRD not on HD, alcohol abuse, history of colon cancer; who presents to start dialysis.  Patient recently been hospitalized from 5/30-6/10, with progressive hypoxia requiring BiPAP found to have moderate right pleural effusion which HCAP was suspected.  Patient was treated with empiric antibiotics and underwent thoracentesis on 6/1.  Cardiology has been consulted as well as pulmonary during his hospital stay.  On 6/8 as patient was found to be hypoxic with O2 saturations in the 40s requiring placement to nonrebreather.  He was titrated down to Ventimask and pulmonology tried nocturnal auto titration of his CPAP prior to being able to be discharged to inpatient rehab with CPAP.  Ultimately patient declined CPAP at night due to claustrophobia but tolerates high flow nasal cannula oxygen of 8 L at night.  During his hospital stay creatinine was noted to be elevated up to 3.7 and dialysis was discussed.  However, patient declined dialysis at that time.  Currently creatinine 5.64 with BUN 130.  Patient noted to have decreased ability to participate in rehab his shortness of breath and generalized weakness.  Since being in the hospital he is at the point where he takes all his energy just to stand up.  Nephrology had been seeing the patient while in rehab and after discussions with patient and family he agreed to dialysis.  Patient reports that he has been having more of a  productive cough.  Denies any significant fevers or chills.  ED Course: N/a  Review of Systems  Constitutional: Positive for malaise/fatigue. Negative for chills and fever.  Eyes: Negative for photophobia and pain.  Respiratory: Positive for cough and shortness of breath.   Cardiovascular: Positive for leg swelling. Negative for chest pain.    Past Medical History:  Diagnosis Date   Alcohol abuse    Colon cancer ([place])    COPD (chronic obstructive pulmonary disease) ([place])    Emphysema of lung ([place])    Former tobacco use    Hypertension    Peripheral edema    Sepsis ([place]) 10/2016   Aspiration PNA/C diff colitis    Past Surgical History:  Procedure Laterality Date   CARDIOVERSION N/A 03/16/2019   Procedure: CARDIOVERSION;  Surgeon: Pixie Casino, MD;  Location: [facility] ENDOSCOPY;  Service: Cardiovascular;  Laterality: N/A;   CARDIOVERSION N/A 03/23/2019   Procedure: CARDIOVERSION;  Surgeon: Lelon Perla, MD;  Location: [place];  Service: Cardiovascular;  Laterality: N/A;   CATARACT EXTRACTION     COLON RESECTION     COLONOSCOPY     IMPLANTATION BONE ANCHORED HEARING AID Left 2016   IR THORACENTESIS ASP PLEURAL SPACE W/IMG GUIDE  05/21/2019   MINOR HEMORRHOIDECTOMY  1995   TEE WITHOUT CARDIOVERSION N/A 03/16/2019   Procedure: TRANSESOPHAGEAL ECHOCARDIOGRAM (TEE);  Surgeon: Pixie Casino, MD;  Location[facility] END[facility]COPY;  Service: Cardiovascular;  Laterality: N/A;   TONSILLECTOMY       reports that he quit smoking about 37 years ago. His smoking use included cigarettes. He has a  20.00 pack-year smoking history. He has never used smokeless tobacco. He reports current alcohol use of about 7.0 standard drinks of alcohol per week. He reports that he does not use drugs.  Allergies  Allergen Reactions   Flexeril [Cyclobenzaprine] Other (See Comments)    Unknown reaction     Family History  Problem Relation Age of Onset   Brain cancer Father    Colon  cancer Mother        with mets to liver   Lung cancer Brother        was a smoker    Prior to Admission medications   Medication Sig Start Date End Date Taking? Authorizing Provider  allopurinol (ZYLOPRIM) 100 MG tablet Take 100 mg by mouth daily. 02/17/19   [provider]  amiodarone (PACERONE) 200 MG tablet Take 1 tablet (200 mg total) by mouth daily. 04/17/19   Erlene Quan, PA-C  apixaban (ELIQUIS) 2.5 MG TABS tablet Take 1 tablet (2.5 mg total) by mouth 2 (two) times daily. 05/30/19   Kayleen Memos, DO  bisoprolol (ZEBETA) 5 MG tablet Take 5 mg by mouth daily.    [provider]  budesonide (PULMICORT) 0.25 MG/2ML nebulizer solution Take 2 mLs (0.25 mg total) by nebulization 2 (two) times daily. 05/30/19   Kayleen Memos, DO  ferrous sulfate 325 (65 FE) MG tablet Take 325 mg by mouth daily.     [provider]  furosemide (LASIX) 40 MG tablet Take 1 tablet (40 mg total) by mouth 2 (two) times daily. 05/30/19   Kayleen Memos, DO  ipratropium-albuterol (DUONEB) 0.5-2.5 (3) MG/3ML SOLN Use twice a day scheduled and every 4 hours as needed for shortness of breath and wheezing Patient not taking: Reported on 05/19/2019 03/26/19   Thurnell Lose, MD  Multiple Vitamin (MULTIVITAMIN WITH MINERALS) TABS tablet Take 1 tablet by mouth daily.    [provider]  OXYGEN Inhale 4 L into the lungs continuous.    [provider]    Physical Exam:  Constitutional: Elderly male in NAD, calm, comfortable There were no vitals filed for this visit. Eyes: PERRL, lids and conjunctivae normal ENMT: Mucous membranes are moist. Posterior pharynx clear of any exudate or lesions. .  Neck: normal, supple, no masses, no thyromegaly Respiratory: Tachypneic with positive wheezes heard throughout and crackles in the mid to lower lung fields.  Patient on 9 L pulse flow nasal cannula oxygen at this time. Cardiovascular: Regular rate and rhythm, no murmurs / rubs /  gallops.  Trace bilateral lower extremity edema. 2+ pedal pulses. No carotid bruits.  Abdomen: no tenderness, no masses palpated. No hepatosplenomegaly. Bowel sounds positive.  Musculoskeletal: no clubbing / cyanosis. No joint deformity upper and lower extremities. Good ROM, no contractures. Normal muscle tone.  Skin: no rashes, lesions, ulcers. No induration Neurologic: CN 2-12 grossly intact. Sensation intact, DTR normal. Strength 5/5 in all 4.  Psychiatric: Normal judgment and insight. Alert and oriented x 3. Normal mood.     Labs on Admission: I have personally reviewed following labs and imaging studies  CBC: No results for input(s): WBC, NEUTROABS, HGB, HCT, MCV, PLT in the last 168 hours. Basic Metabolic Panel: Recent Labs  Lab 06/03/19 0546 06/04/19 0616 06/04/19 1222 06/06/19 1301 06/07/19 0746  NA 140 139 138 139 142  K 5.1 5.4* 5.9* 4.4 4.3  CL 100 100 98 99 101  CO2 27 25 27 25 29   GLUCOSE 113* 106* 126* 146* 108*  BUN 110* 117* 120* 132* 130*  CREATININE 5.07* 5.61* 5.79* 5.81* 5.64*  CALCIUM 8.9 8.8* 8.9 8.9 8.9  PHOS  --   --  7.0* 6.9* 7.1*   GFR: CrCl cannot be calculated (Unknown ideal weight.). Liver Function Tests: Recent Labs  Lab 06/04/19 1222 06/06/19 1301 06/07/19 0746  ALBUMIN 2.6* 2.6* 2.5*   No results for input(s): LIPASE, AMYLASE in the last 168 hours. No results for input(s): AMMONIA in the last 168 hours. Coagulation Profile: No results for input(s): INR, PROTIME in the last 168 hours. Cardiac Enzymes: No results for input(s): CKTOTAL, CKMB, CKMBINDEX, TROPONINI in the last 168 hours. BNP (last 3 results) Recent Labs    05/04/19 1137  PROBNP 443.0*   HbA1C: No results for input(s): HGBA1C in the last 72 hours. CBG: Recent Labs  Lab 06/04/19 0621 06/04/19 2243 06/05/19 0743  GLUCAP 109* 132* 111*   Lipid Profile: No results for input(s): CHOL, HDL, LDLCALC, TRIG, CHOLHDL, LDLDIRECT in the last 72 hours. Thyroid Function  Tests: No results for input(s): TSH, T4TOTAL, FREET4, T3FREE, THYROIDAB in the last 72 hours. Anemia Panel: No results for input(s): VITAMINB12, FOLATE, FERRITIN, TIBC, IRON, RETICCTPCT in the last 72 hours. Urine analysis:    Component Value Date/Time   COLORURINE YELLOW 06/03/2019 1022   APPEARANCEUR HAZY (A) 06/03/2019 1022   LABSPEC 1.014 06/03/2019 1022   PHURINE 5.0 06/03/2019 1022   GLUCOSEU NEGATIVE 06/03/2019 1022   HGBUR NEGATIVE 06/03/2019 1022   BILIRUBINUR NEGATIVE 06/03/2019 1022   KETONESUR NEGATIVE 06/03/2019 1022   PROTEINUR 30 (A) 06/03/2019 1022   UROBILINOGEN 1.0 05/08/2013 1738   NITRITE NEGATIVE 06/03/2019 1022   LEUKOCYTESUR NEGATIVE 06/03/2019 1022   Sepsis Labs: Recent Results (from the past 240 hour(s))  SARS Coronavirus 2 (CEPHEID - Performed in [place] hospital lab), Hosp Order     Status: None   Collection Time: 06/01/19  4:32 PM   Specimen: Nasopharyngeal Swab  Result Value Ref Range Status   SARS Coronavirus 2 NEGATIVE NEGATIVE Final    Comment: (NOTE) If result is NEGATIVE SARS-CoV-2 target nucleic acids are NOT DETECTED. The SARS-CoV-2 RNA is generally detectable in upper and lower  respiratory specimens during the acute phase of infection. The lowest  concentration of SARS-CoV-2 viral copies this assay can detect is 250  copies / mL. A negative result does not preclude SARS-CoV-2 infection  and should not be used as the sole basis for treatment or other  patient management decisions.  A negative result may occur with  improper specimen collection / handling, submission of specimen other  than nasopharyngeal swab, presence of viral mutation(s) within the  areas targeted by this assay, and inadequate number of viral copies  (<250 copies / mL). A negative result must be combined with clinical  observations, patient history, and epidemiological information. If result is POSITIVE SARS-CoV-2 target nucleic acids are DETECTED. The SARS-CoV-2  RNA is generally detectable in upper and lower  respiratory specimens dur ing the acute phase of infection.  Positive  results are indicative of active infection with SARS-CoV-2.  Clinical  correlation with patient history and other diagnostic information is  necessary to determine patient infection status.  Positive results do  not rule out bacterial infection or co-infection with other viruses. If result is PRESUMPTIVE POSTIVE SARS-CoV-2 nucleic acids MAY BE PRESENT.   A presumptive positive result was obtained on the submitted specimen  and confirmed on repeat testing.  While 2019 novel coronavirus  (SARS-CoV-2) nucleic acids may  be present in the submitted sample  additional confirmatory testing may be necessary for epidemiological  and / or clinical management purposes  to differentiate between  SARS-CoV-2 and other Sarbecovirus currently known to infect humans.  If clinically indicated additional testing with an alternate test  methodology [phone]) is advised. The SARS-CoV-2 RNA is generally  detectable in upper and lower respiratory sp ecimens during the acute  phase of infection. The expected result is Negative. Fact Sheet for Patients:  StrictlyIdeas.no Fact Sheet for Healthcare Providers: BankingDealers.co.za This test is not yet approved or cleared by the [place] FDA and has been authorized for detection and/or diagnosis of SARS-CoV-2 by FDA under an Emergency Use Authorization (EUA).  This EUA will remain in effect (meaning this test can be used) for the duration of the COVID-19 declaration under Section 564(b)(1) of the Act, 21 U.S.C. section 360bbb-3(b)(1), unless the authorization is terminated or revoked sooner. Performed at [facility], [place] [address] [place], [place] [address]   Culture, body fluid-bottle     Status: None (Preliminary result)   Collection Time: 06/02/19 10:46 AM   Specimen: Pleura   Result Value Ref Range Status   Specimen Description PLEURAL RIGHT  Final   Special Requests NONE  Final   Culture   Final    NO GROWTH 4 DAYS Performed at [facility] [address] [place], [place] [address]    Report Status PENDING  Incomplete  Gram stain     Status: None   Collection Time: 06/02/19 10:46 AM   Specimen: Pleura  Result Value Ref Range Status   Specimen Description PLEURAL RIGHT  Final   Special Requests NONE  Final   Gram Stain   Final    RARE WBC PRESENT, PREDOMINANTLY PMN NO ORGANISMS SEEN Performed at [facility], [place] [address] [place],  [address]    Report Status 06/03/2019 FINAL  Final     Radiological Exams on Admission: No results found.  Assessment/Plan ESRD: Patient had previously declined dialysis, but has since changed his mind.  Nephrology, vascular surgery, and interventional radiology consulted.  Plan for IR to place temporary hemodialysis catheter in a.m.  Vascular surgery recommends continuing holding Eliquis over the weekend for possible left AV fistula versus graft pending vein mapping on Monday. -Admit to a telemetry bed -Renal diet with fluid restriction -N.p.o. after midnight -Appreciate nephrology, interventional radiology vascular surgery consultative services, will follow for further recommendation  COPD, chronic respiratory failure with hypoxia: Patient is oxygen dependent.  Currently requiring high flow nasal cannula oxygen to maintain O2 saturations.  Patient was just treated for healthcare associated pneumonia and completed antibiotic treatment.  Still noted to be wheezing on physical exam. -Aspiration precautions -Continuous pulse oximetry with high flow nasal cannula oxygen as needed to maintain O2 saturations -Continue Budesonide nebs twice daily  -DuoNeb twice daily and as needed for shortness of breath/wheezing  Chronic congestive heart failure: Patient appears to be fluid overload in combination with  end-stage renal disease. -Strict I&Os and daily weights  Paroxysmal atrial fibrillation/PSVT on chronic anticoagulation: Patient had been on Eliquis.  Heart rates currently appear rate controlled. -Continue amiodarone -Hold Eliquis for need of procedures  Macrocytic anemia: Hemoglobin 8.6 with elevated MCV.  No reports of bleeding. -Check vitamin B12 and folate in a.m. and replace as needed -Recheck CBC  Leukocytosis: WBC 11.7 which appears unchanged from previous. -Continue to monitor  Essential hypertension -Continue current blood pressure medications as seen  above   DVT prophylaxis: SCDs Code Status: DNR Family Communication: No family present at bedside Disposition Plan: To be determined Consults called: Nephrology Admission status: Inpatient   Norval Morton MD [facility] Pager [phone]   If 7PM-7AM, please contact night-coverage [email or web address] Password [facility]  06/07/2019, 11:03 AM

## 2019-06-07 NOTE — Progress Notes (Signed)
Patient ID: David Barton, male   DOB: 14-Jun-1933, 85 y.o.   MRN: [id number]  This NP visited patient at the bedside as a follow up to  yesterday's meeting, palliative medicine needs and emotional support.  Patient is sitting on side of bed and working with occupational therapy.  We had a continued conversation regarding his decision as it relates to initiation of dialysis.  He has made a decision to "give it a try".  His family, wife and children, have encouraged him to do so,  and he feels good about his decision.  [name] understands the seriousness of his medical situations and is going to take it one day at a time.  Ultimately his hope is that dialysis will improve his overall health and wellness and provide some more quality time with his family.  We discussed the importance of self responsibility in his overall wellness plan. Emotional support offered.   Discussed with patient the importance of continued conversation with his  Family/wife  and the medical providers regarding overall plan of care and treatment options,  ensuring decisions are within the context of the patients values and GOCs.  Total time spent on the unit was 35 minutes    Questions and concerns addressed.  Discussed with Dr Jimmy Footman and Archie Patten Sylvia/wife  Greater than 50% of the time was spent in counseling and coordination of care  Wadie Lessen NP  Palliative Medicine Team Team Phone # [phone] Pager [phone]

## 2019-06-07 NOTE — Progress Notes (Signed)
Physical Therapy Session Note  Patient Details  Name: David Barton MRN: [id number] Date of Birth: 08-02-1933  Today's Date: 06/07/2019 PT Individual Time: 1115-1200 PT Individual Time Calculation (min): 45 min   Short Term Goals: Week 1:  PT Short Term Goal 1 (Week 1): = to LTGs based on ELOS  Skilled Therapeutic Interventions/Progress Updates:   Pt received sitting in recliner in room, agreeable to try some therapy (as able to tolerate due to fatigue, oxygen sats and high level O2 needed, stress on heart).  Pt verbalizing he is willing to give nustep a try at very low level, therefore pt assisted via stand pivot transfer from recliner to w/c at min/guard level with min cues for safety and O2 management.  PT notes that pt is on 8-9 L O2 in room, therefore placed on 10L on tank (goes from 8 to 10).  Once in hallway, Algis Liming, PA reports that she does not want pt to leave room for exercise as the demand on heart would be too high, esp now that he is on higher supplemental oxygen.  She does report that room seated level exercises are okay.  PT assisted back to room and transferred back to recliner as above.  Pt allowed several minutes of rest to continue.  Also pt very conversational during session and needed re-direction at times.  Then continued with seated exercises as follows: LAQ's x 10 reps, hamstring curls with orange theraband x 10 reps each LE, hip abd with orange theraband x 10 reps, hip add with pillow squeeze x 10 reps.  Bicep curls with 3# weight x 10 reps each, LUE overhead tricep extension x 10 reps (unable to do on R due to pain), LUE punching x 2 sets of 5 reps (unable on RUE).  Pt needing rest breaks in between each task due to fatigue.  Ended with sit<>stand with BUE support x 2 sets of 5 reps.  First bout, pt required increased rest break due to sats at 84-85%, but was finally able to increase to 87% (which seems to be closer to new baseline).  Pt did better with second set with less  time to recover.  Pt left in room with all needs in reach.   Therapy Documentation Precautions:  Precautions Precautions: Fall, Other (comment) Precaution Comments: monitor SpO2 (4L-oxygen dependent baseline) Restrictions Weight Bearing Restrictions: No General: PT Amount of Missed Time (min): 15 Minutes PT Missed Treatment Reason: Patient fatigue(Did not bill whole time, as PA had discussion w/ pt and needed to return to room)  Pain: Pt reports no pain during session, only fatigue.   Therapy/Group: Individual Therapy  Denice Bors 06/07/2019, 12:33 PM

## 2019-06-07 NOTE — Discharge Summary (Addendum)
Physician Discharge Summary  Patient ID: David Barton MRN: [id number] DOB/AGE: May 16, 1933 85 y.o.  Admit date: 05/30/2019 Discharge date: 06/07/2019  Discharge Diagnoses:  Principal Problem:   Acute on chronic respiratory failure ([name]) Active Problems:   Atrial fibrillation ([name])   HCAP (healthcare-associated pneumonia)   Debility   S/P thoracentesis   SOB (shortness of breath)   Leukocytosis   Chronic kidney disease (CKD), stage IV (severe) ([name])   Acute on chronic combined systolic and diastolic heart failure ([name])   Chronic obstructive pulmonary disease ([name])   Lung infiltrate   Shortness of breath   DNR (do not resuscitate)   Palliative care by specialist   Discharged Condition: stable   Significant Diagnostic Studies: Dg Chest 1 View  Result Date: 06/02/2019 CLINICAL DATA:  Pleural effusion. Status post thoracentesis. EXAM: CHEST  1 VIEW COMPARISON:  06/01/2019 FINDINGS: Small right pleural effusion shows decrease in size since previous study. No pneumothorax visualized. Atelectasis or infiltrate is again seen in both lung bases. Mild scarring is also noted in the lower lung zones bilaterally. Stable cardiomegaly. IMPRESSION: 1. Decreased size of small right pleural effusion. No pneumothorax visualized. 2. Bibasilar atelectasis versus infiltrates, without significant change. 3. Stable cardiomegaly. Electronically Signed   By: Earle Gell M.D.   On: 06/02/2019 11:54   Dg Chest 1 View  Result Date: 05/21/2019 CLINICAL DATA:  Status post right thoracentesis EXAM: CHEST  1 VIEW COMPARISON:  05/20/2019 CT FINDINGS: Cardiac shadow is enlarged but stable. Mild atelectasis is noted in the right mid and lower lung. No pneumothorax is noted following thoracentesis. No bony abnormality is seen. IMPRESSION: No pneumothorax following right-sided thoracentesis. Mild atelectatic changes on the right. Electronically Signed   By: Inez Catalina M.D.   On: 05/21/2019 14:30   Dg Chest 2  View  Result Date: 06/04/2019 CLINICAL DATA:  Shortness of breath and weakness. EXAM: CHEST - 2 VIEW COMPARISON:  Radiographs 06/02/2019 and 06/01/2019.  CT 05/20/2019. FINDINGS: Stable cardiac enlargement. There has been no significant change in the right-greater-than-left pleural effusions and associated bibasilar airspace opacities. There is no pneumothorax or pulmonary edema. The bones appear unchanged. IMPRESSION: Unchanged residual small pleural effusions and bibasilar airspace opacities. No new findings. Electronically Signed   By: Richardean Sale M.D.   On: 06/04/2019 11:28   Dg Chest 2 View  Result Date: 06/01/2019 CLINICAL DATA:  Shortness of breath and weakness. EXAM: CHEST - 2 VIEW COMPARISON:  May 31, 2019 FINDINGS: Effusion and opacity in the right base is stable. A small effusion with underlying atelectasis in the left base is stable. The cardiomediastinal silhouette is unchanged. No pneumothorax. No other changes. IMPRESSION: Right greater than left pleural effusions with underlying atelectasis. No other interval changes or acute abnormalities. Electronically Signed   By: Dorise Bullion III M.D   On: 06/01/2019 12:20   Dg Chest 2 View  Result Date: 05/31/2019 CLINICAL DATA:  Lung infiltrate EXAM: CHEST - 2 VIEW COMPARISON:  Six [place] 2020, 05/23/2019, CT 05/20/2019 FINDINGS: Small left pleural effusion and moderate right pleural effusion without significant change. Persistent consolidation at the right middle lobe and right greater than left lung base. Enlarged cardiomediastinal silhouette with central congestion. Aortic atherosclerosis. No pneumothorax. IMPRESSION: No significant interval change in moderate right and small left pleural effusion. Continued airspace consolidation at the right middle lobe and right greater than left lung base. Electronically Signed   By: Donavan Foil M.D.   On: 05/31/2019 19:16   Dg Chest 2  View  Result Date: 05/31/2019 CLINICAL DATA:  Hypoxia  EXAM: CHEST - 2 VIEW COMPARISON:  05/23/2019 FINDINGS: Cardiac shadow is enlarged. Enlarging right-sided pleural effusion is noted with likely underlying atelectasis/infiltrate. Small left effusion is noted. No other focal abnormality is noted. IMPRESSION: Bilateral pleural effusions right greater than left increased when compared with the prior exam particularly on the right. Electronically Signed   By: Inez Catalina M.D.   On: 05/31/2019 12:34   Ct Chest Wo Contrast  Result Date: 05/20/2019 CLINICAL DATA:  Unresolved pneumonia EXAM: CT CHEST WITHOUT CONTRAST TECHNIQUE: Multidetector CT imaging of the chest was performed following the standard protocol without IV contrast. COMPARISON:  03/19/2019 FINDINGS: Cardiovascular: Atherosclerotic calcifications of the aortic arch, great vessels, and coronary arteries are noted. No evidence of aortic aneurysm. Mediastinum/Nodes: No abnormal mediastinal adenopathy. Visualized thyroid is unremarkable. Small pericardial effusion is new. Lungs/Pleura: Left lower lobe consolidation and left pleural effusion are improved. Right lower lobe consolidation has markedly worsened. A moderate right pleural effusion has worsened. There is also patchy airspace disease throughout the right upper lobe. Subsegmental atelectasis in the right middle lobe. No pneumothorax. Centrilobular emphysema towards the lung apices. Upper Abdomen: No acute abnormality. Musculoskeletal: No vertebral compression deformity. IMPRESSION: Marked worsening consolidation in the right upper and lower lobes. Improved left lower lobe consolidation and left pleural effusion Worsening right pleural effusion which is moderate. New small pericardial effusion. Aortic Atherosclerosis (ICD10-I70.0) and Emphysema (ICD10-J43.9). Electronically Signed   By: Marybelle Killings M.D.   On: 05/20/2019 13:18   US Renal  Result Date: 06/02/2019 CLINICAL DATA:  Acute kidney injury EXAM: RENAL / URINARY TRACT ULTRASOUND COMPLETE  COMPARISON:  03/04/2019 FINDINGS: Right Kidney: Renal measurements: 11.3 x 6.1 x 6.1 cm = volume: 216 mL. Increased renal echogenicity. No hydronephrosis. Upper pole hypoechoic cortical complex cyst measures 1.4 cm. Left Kidney: Renal measurements: 9.6 x 6.3 x 6.3 cm = volume: 199 mL. Increased renal echogenicity. No hydronephrosis. No acute finding by ultrasound. Bladder: Diffuse bladder wall thickening, this may be related to underdistention. IMPRESSION: Increased renal echogenicity compatible with medical renal disease. Suspect small minimally complex right renal cortical cyst measuring 1.4 cm Nonspecific bladder wall thickening Electronically Signed   By: Jerilynn Mages.  Shick M.D.   On: 06/02/2019 14:30   Dg Chest Port 1 View  Result Date: 05/23/2019 CLINICAL DATA:  Right-sided pleural effusion EXAM: PORTABLE CHEST 1 VIEW COMPARISON:  05/21/2019 FINDINGS: Cardiac shadow is stable. Thickening is noted along the minor fissure on the right stable from the prior exam. Mild right basilar atelectasis is seen. No new focal abnormality is noted. No pneumothorax is seen. IMPRESSION: Stable changes on the right. No pneumothorax or sizable effusion is seen. Electronically Signed   By: Inez Catalina M.D.   On: 05/23/2019 07:54   Dg Chest Port 1 View  Result Date: 05/20/2019 CLINICAL DATA:  Pneumonia EXAM: PORTABLE CHEST 1 VIEW COMPARISON:  05/19/2019 FINDINGS: Small to moderate right pleural effusion, similar. Associated right lower lobe opacity, likely atelectasis. Trace left pleural effusion. Left lung is otherwise clear. No pneumothorax is seen. Cardiomegaly. IMPRESSION: Small to moderate right pleural effusion and trace left pleural effusion, grossly unchanged. Associated right lower lobe opacity, likely atelectasis. Electronically Signed   By: Julian Hy M.D.   On: 05/20/2019 05:05   Dg Chest Portable 1 View  Result Date: 05/19/2019 CLINICAL DATA:  Chest pain EXAM: PORTABLE CHEST 1 VIEW COMPARISON:  05/04/2019  FINDINGS: There is increasing opacity at the right lung base with  shift of the mediastinum to the right most consistent with increasing collapse in the right lower lobe. Heterogeneous opacities throughout the right lung are stable. Increasing heterogeneous opacities at the left base. Right pleural effusion is suspected. IMPRESSION: Increasing collapse in the right lower lobe. Stable opacities throughout the right lung. Increasing pulmonary opacities at the left lung base. Small right pleural effusion is suspected. Electronically Signed   By: Marybelle Killings M.D.   On: 05/19/2019 10:13   Dg Swallowing Func-speech Pathology  Result Date: 05/21/2019 Objective Swallowing Evaluation: Type of Study: MBS-Modified Barium Swallow Study  Patient Details Name: David Barton MRN: [id number] Date of Birth: 12/30/1932 Today's Date: 05/21/2019 Time: SLP Start Time (ACUTE ONLY): 1300 -SLP Stop Time (ACUTE ONLY): 1315 SLP Time Calculation (min) (ACUTE ONLY): 15 min Past Medical History: Past Medical History: Diagnosis Date . Alcohol abuse  . Colon cancer ([place])  . COPD (chronic obstructive pulmonary disease) ([place])  . Emphysema of lung ([place])  . Former tobacco use  . Hypertension  . Peripheral edema  . Sepsis ([place]) 10/2016  Aspiration PNA/C diff colitis Past Surgical History: Past Surgical History: Procedure Laterality Date . CARDIOVERSION N/A 03/16/2019  Procedure: CARDIOVERSION;  Surgeon: Pixie Casino, MD;  Location: [facility] ENDOSCOPY;  Service: Cardiovascular;  Laterality: N/A; . CARDIOVERSION N/A 03/23/2019  Procedure: CARDIOVERSION;  Surgeon: Lelon Perla, MD;  Location: [facility] ENDOSCOPY;  Service: Cardiovascular;  Laterality: N/A; . CATARACT EXTRACTION   . COLON RESECTION   . COLONOSCOPY   . IMPLANTATION BONE ANCHORED HEARING AID Left 2016 . MINOR HEMORRHOIDECTOMY  1995 . TEE WITHOUT CARDIOVERSION N/A 03/16/2019  Procedure: TRANSESOPHAGEAL ECHOCARDIOGRAM (TEE);  Surgeon: Pixie Casino, MD;  Location: [facility] ENDOSCOPY;  Service:  Cardiovascular;  Laterality: N/A; . TONSILLECTOMY   HPI: Patient is an 85 y.o. male with PMH: COPD: oxygen dependent on 4L at home, atrial fibrillation on Xarelto, diastolic heart failure, cardiomyopathy, chronic kidney disease, colon cancer, ETOH abuse, who also had a prolonged hospitalization from March to April for septic shock. He arrived at the hospital on 5/30 with mid-sternal chest pain radiating to the neck and 2 week h/o worsening breathing. CXR revealed RLL collapse, mediastinal shift to the right, stable opacities in right lung, increasing opacities in LL base.  Repeat CXR revealed small to moderate right pleural effusion and trace left pleural effusion.  Subjective: pleasant, no c/o swallow problems Assessment / Plan / Recommendation CHL IP CLINICAL IMPRESSIONS 05/21/2019 Clinical Impression Patient presents with an oropharyngeal swallow that is WFL-WNL without aspiration or penetration even when taking large volume sips of thin liquids via cup and straw sips. He exhbited a trace amount of vallecular residuals, but these fully cleared with spontaneous swallowing.  SLP Visit Diagnosis Dysphagia, unspecified (R13.10) Attention and concentration deficit following -- Frontal lobe and executive function deficit following -- Impact on safety and function No limitations   CHL IP TREATMENT RECOMMENDATION 05/21/2019 Treatment Recommendations No treatment recommended at this time   Prognosis 05/21/2019 Prognosis for Safe Diet Advancement Good Barriers to Reach Goals -- Barriers/Prognosis Comment -- CHL IP DIET RECOMMENDATION 05/21/2019 SLP Diet Recommendations Regular solids;Thin liquid Liquid Administration via Cup;Straw Medication Administration Whole meds with liquid Compensations -- Postural Changes --   No flowsheet data found.  CHL IP FOLLOW UP RECOMMENDATIONS 05/21/2019 Follow up Recommendations None   No flowsheet data found.     CHL IP ORAL PHASE 05/21/2019 Oral Phase WFL Oral - Pudding Teaspoon -- Oral - Pudding Cup  -- Oral - Honey Teaspoon --  Oral - Honey Cup -- Oral - Nectar Teaspoon -- Oral - Nectar Cup -- Oral - Nectar Straw -- Oral - Thin Teaspoon -- Oral - Thin Cup -- Oral - Thin Straw -- Oral - Puree -- Oral - Mech Soft -- Oral - Regular -- Oral - Multi-Consistency -- Oral - Pill -- Oral Phase - Comment --  CHL IP PHARYNGEAL PHASE 05/21/2019 Pharyngeal Phase WFL Pharyngeal- Pudding Teaspoon -- Pharyngeal -- Pharyngeal- Pudding Cup -- Pharyngeal -- Pharyngeal- Honey Teaspoon -- Pharyngeal -- Pharyngeal- Honey Cup -- Pharyngeal -- Pharyngeal- Nectar Teaspoon -- Pharyngeal -- Pharyngeal- Nectar Cup -- Pharyngeal -- Pharyngeal- Nectar Straw -- Pharyngeal -- Pharyngeal- Thin Teaspoon -- Pharyngeal -- Pharyngeal- Thin Cup -- Pharyngeal -- Pharyngeal- Thin Straw -- Pharyngeal -- Pharyngeal- Puree -- Pharyngeal -- Pharyngeal- Mechanical Soft -- Pharyngeal -- Pharyngeal- Regular -- Pharyngeal -- Pharyngeal- Multi-consistency -- Pharyngeal -- Pharyngeal- Pill -- Pharyngeal -- Pharyngeal Comment --  CHL IP CERVICAL ESOPHAGEAL PHASE 05/21/2019 Cervical Esophageal Phase WFL Pudding Teaspoon -- Pudding Cup -- Honey Teaspoon -- Honey Cup -- Nectar Teaspoon -- Nectar Cup -- Nectar Straw -- Thin Teaspoon -- Thin Cup -- Thin Straw -- Puree -- Mechanical Soft -- Regular -- Multi-consistency -- Pill -- Cervical Esophageal Comment -- [name] 05/21/2019, 1:47 PM Sonia Baller, MA, CCC-SLP Speech Therapy [facility] Acute Rehab Pager: [phone]              Ir Thoracentesis Asp Pleural Space W/img Guide  Result Date: 05/21/2019 INDICATION: Patient with history of COPD, emphysema, acute on chronic hypoxic and hypercapnic respiratory failure, and right pleural effusion. Request is made for diagnostic and therapeutic right thoracentesis. EXAM: ULTRASOUND GUIDED DIAGNOSTIC AND THERAPEUTIC RIGHT THORACENTESIS MEDICATIONS: 10 mL 1% lidocaine COMPLICATIONS: None immediate. PROCEDURE: An ultrasound guided thoracentesis was thoroughly discussed with  the patient and questions answered. The benefits, risks, alternatives and complications were also discussed. The patient understands and wishes to proceed with the procedure. Written consent was obtained. Ultrasound was performed to localize and mark an adequate pocket of fluid in the right chest. The area was then prepped and draped in the normal sterile fashion. 1% Lidocaine was used for local anesthesia. Under ultrasound guidance a 6 Fr Safe-T-Centesis catheter was introduced. Thoracentesis was performed. The catheter was removed and a dressing applied. FINDINGS: A total of approximately 1.85 L of dark red fluid was removed. Samples were sent to the laboratory as requested by the clinical team. IMPRESSION: Successful ultrasound guided right thoracentesis yielding 1.85 L of pleural fluid. Read by: Earley Abide, PA-C Electronically Signed   By: Jerilynn Mages.  Shick M.D.   On: 05/21/2019 14:31   US Thoracentesis Asp Pleural Space W/img Guide  Result Date: 06/02/2019 INDICATION: Patient with history of colon cancer, COPD, CHF, chronic kidney disease, dyspnea, recurrent right pleural effusion. Request made for diagnostic and therapeutic right thoracentesis. EXAM: ULTRASOUND GUIDED DIAGNOSTIC AND THERAPEUTIC RIGHT THORACENTESIS MEDICATIONS: None COMPLICATIONS: None immediate. PROCEDURE: An ultrasound guided thoracentesis was thoroughly discussed with the patient and questions answered. The benefits, risks, alternatives and complications were also discussed. The patient understands and wishes to proceed with the procedure. Written consent was obtained. Ultrasound was performed to localize and mark an adequate pocket of fluid in the right chest. The area was then prepped and draped in the normal sterile fashion. 1% Lidocaine was used for local anesthesia. Under ultrasound guidance a 6 Fr Safe-T-Centesis catheter was introduced. Thoracentesis was performed. The catheter was removed and a dressing applied. FINDINGS: A total of  approximately 1.4 liters of blood-tinged fluid was removed. Samples were sent to the laboratory as requested by the clinical team. Due to patient hypotension only the above amount of fluid was removed today. IMPRESSION: Successful ultrasound guided diagnostic and therapeutic right thoracentesis yielding 1.4 liters of pleural fluid. Read by: Rowe Robert, PA-C Electronically Signed   By: Jerilynn Mages.  Shick M.D.   On: 06/02/2019 10:46    Labs:  Basic Metabolic Panel: Recent Labs  Lab 06/01/19 0334 06/02/19 0411 06/03/19 0546 06/04/19 0616 06/04/19 1222 06/06/19 1301 06/07/19 0746  NA 140 141 140 139 138 139 142  K 5.1 5.3* 5.1 5.4* 5.9* 4.4 4.3  CL 102 99 100 100 98 99 101  CO2 26 29 27 25 27 25 29   GLUCOSE 120* 105* 113* 106* 126* 146* 108*  BUN 93* 101* 110* 117* 120* 132* 130*  CREATININE 4.56* 5.00* 5.07* 5.61* 5.79* 5.81* 5.64*  CALCIUM 8.9 8.8* 8.9 8.8* 8.9 8.9 8.9  PHOS  --   --   --   --  7.0* 6.9* 7.1*    CBC: CBC Latest Ref Rng & Units 06/07/2019 05/31/2019 05/24/2019  WBC 4.0 - 10.5 K/uL 11.6(H) 11.7(H) 6.8  Hemoglobin 13.0 - 17.0 g/dL 8.6(L) 9.3(L) 8.5(L)  Hematocrit 39.0 - 52.0 % 28.7(L) 31.8(L) 28.7(L)  Platelets 150 - 400 K/uL 248 262 221    CBG: Recent Labs  Lab 06/04/19 0621 06/04/19 2243 06/05/19 0743  GLUCAP 109* 132* 111*    Brief HPI:   David Barton is an 85 year old male with history of chronic hypoxic respiratory failure since recent admission for septic shock due to CAP with acute on chronic renal failure complicated by A fib, COPD, CKD-stage IV (has refused dialysis), oxygen dependent since last admission. He was readmitted on 05/19/19 with recurrent acute on chronic respiratory failure due to PNA with marked worsening of RLL collapse and right pleural effusion and new small pericardial effusion.  He was treated with BiPAP, IV antibiotics and gentle right diuresis.  He had difficulty tolerating BiPAP and has refused this therefore transition to 15 L high flow  oxygen.  Cardiology consulted for input and felt with respiratory failure was pulmonary in nature and effusion lesion was parapneumonic.  Xarelto was transitioned to IV heparin and he underwent right thoracocentesis of 1.8 L of dark red fluid on 06/01.   RLL pneumonia felt to be due to aspiration and antibiotics were narrowed to Augmentin and completed by 6/7.  Recommendations for follow-up CT chest in 4 to 5 weeks.  Lasix was added for diuresis and has been adjusted up and down depending on renal status.  Patient continued to have issues with hypoxia and was unable to tolerate CPAP therefore pulmonary recommended use of facemask at nights to provide adequate oxygenation and keeping target saturation around 89 to 92%.  Patient's respiratory status was improving however he was noted to be severely deconditioned.  CIR was recommended due to functional deficits   Hospital Course: David Barton was admitted to rehab 05/30/2019 for inpatient therapies to consist of PT and OT at least three hours five days a week. Past admission physiatrist, therapy team and rehab RN have worked together to provide customized collaborative inpatient rehab.  He was able to tolerate facemask with 8 to 9 L of oxygen at nights.  Follow-up labs on 6/11 revealed worsening of renal status with serum creatinine up to 4.5.  Nephrology was consulted for input on diuresis and recommended follow-up chest x-ray to  rule out pulmonary cause of shortness of breath.  Chest x-ray done revealing recurrent right effusion.  Lasix was increased to 80 mg twice daily without improvement in effusion therefore radiology was consulted for assistance.  He underwent thoracocentesis with removal of 1.4 L of blood-tinged fluid on 6/13.   He did receive dose of IV Feraheme on 6/14 and follow up renal ultrasound done revealing increased renal echogenicity compatible with medical renal disease.  His respiratory status did not improve after thoracocentesis and  aggressive diuresis was initiated by nephrology without much improvement in fluid status. His weight continue to be on upward trend 231 pounds and he has had increase in abdominal girth.  His respiratory status has worsened with decrease in activity tolerance and increasing oxygen needs.  Nephrology felt that patient's symptoms due to cardiorenal syndrome and multiple discussions had with family, Dr. Jimmy Footman as well as palliative care to help determine goals of care.  Patient ultimately decided to start trial of hemodialysis.  Due to inability to tolerate activity as well as need for close medical follow-up he was discharged to acute services on 06/07/19   Rehab course: During patient's stay in rehab weekly team conferences were held to monitor patient's progress, set goals and discuss barriers to discharge. At admission, patient required min assist with mobility and supervision with ADLs.  He has had decline in his activity tolerance during his stay.  He was able to perform transfers and was close supervision and required contact-guard assist to ambulate short distances however was limited by hypoxia and required frequent rest breaks with cues for breathing.  Due to medical issues his therapy was limited in the last 48 hours prior to discharge.    Current Medications:  . allopurinol  100 mg Oral Daily  . amiodarone  200 mg Oral Daily  . apixaban  2.5 mg Oral BID  . bisacodyl  10 mg Oral Daily  . budesonide (PULMICORT) nebulizer solution  0.25 mg Nebulization BID  . furosemide  160 mg Oral BID  . hydrocerin   Topical BID  . ipratropium-albuterol  3 mL Nebulization BID     Diet: Renal diet/1200 cc FR  Special Instructions: Needs 9 L high flow oxygen during the day and  10 Lvia face mask at nights--titarate to keep oxygen levels 89-92 % .   Disposition: Acute hospital.      Signed: Bary Leriche 06/07/2019, 4:16 PM Patient seen and examined by me on day of discharge. Delice Lesch, MD,  ABPMR

## 2019-06-07 NOTE — Progress Notes (Signed)
Occupational Therapy Session Note  Patient Details  Name: David Barton MRN: [id number] Date of Birth: Jan 19, 1933  Today's Date: 06/07/2019 OT Individual Time: 4142-3953 OT Individual Time Calculation (min): 60 min    Short Term Goals: Week 1:  OT Short Term Goal 1 (Week 1): STG = LTG due to length of stay  Skilled Therapeutic Interventions/Progress Updates:    Treatment session with focus on goals of care and activity tolerance as pt continues to voice desire to d/c home as soon as medically stable.  Pt on 8-9L O2 and continuing to sat 85-88% seated EOB.  Pt completed grooming tasks with setup assist while seated EOB.  Engaged in discussion about current goals of care and plan regarding HD.  Pt reports he is in agreement to attempt HD in hopes of increased activity tolerance and quality of life/time with his family.  Pt completed sit > stand from EOB with supervision, however continues to require prolonged rest break post even 10-20 seconds of standing.  Pt wanting to attempt ambulation, walked 65' with RW with supervision while on 8L O2.  Pt sats 83% post ambulation.  Pt reporting frustration with decreased sats and still has multiple questions regarding medical issues.  Palliative care NP arrived during portion of session, engaging in discussion regarding quality of life and goals of care, including therapy.  Therapy Documentation Precautions:  Precautions Precautions: Fall, Other (comment) Precaution Comments: monitor SpO2 (4L-oxygen dependent baseline) Restrictions Weight Bearing Restrictions: No Pain:  Pt with no c/o pain   Therapy/Group: Individual Therapy  [name] 06/07/2019, 12:49 PM

## 2019-06-07 NOTE — Progress Notes (Addendum)
[facility] PHYSICAL MEDICINE & REHABILITATION PROGRESS NOTE   Subjective/Complaints: Patient seen sitting up at the edge of his bed this morning.  Been noted to have good sitting balance.  He states he slept well overnight.  Discussed with patient his discussion with nephrology and palliative care yesterday, notes reviewed.  Patient states that per insistence of children, patient is going to proceed with HD.  Patient states that there was some misunderstanding regarding life expectancy.  Review of systems: + Shortness of breath, unchanged.  Denies chest pain, nausea, vomiting, diarrhea.  Objective:   No results found. No results for input(s): WBC, HGB, HCT, PLT in the last 72 hours. Recent Labs    06/06/19 1301 06/07/19 0746  NA 139 142  K 4.4 4.3  CL 99 101  CO2 25 29  GLUCOSE 146* 108*  BUN 132* 130*  CREATININE 5.81* 5.64*  CALCIUM 8.9 8.9    Intake/Output Summary (Last 24 hours) at 06/07/2019 0932 Last data filed at 06/07/2019 0700 Gross per 24 hour  Intake 360 ml  Output 1100 ml  Net -740 ml     Physical Exam: Vital Signs Blood pressure 130/88, pulse 88, temperature 97.9 F (36.6 C), temperature source Oral, resp. rate (!) 22, height 5\' 10"  (1.778 m), weight 105 kg, SpO2 91 %. Constitutional: No distress . Vital signs reviewed. HENT: Normocephalic.  Atraumatic. Eyes: EOMI.  No discharge. Cardiovascular: No JVD. Respiratory: Increased WOB.  + [place]. GI: Non-distended. Musc: No edema or tenderness in extremities. Neurologic: Alert and oriented Motor: 4-4+/5 throughout, stable. Skin: Warm and dry.  Intact.   Assessment/Plan: 1. Functional deficits secondary to debility which require 3+ hours per day of interdisciplinary therapy in a comprehensive inpatient rehab setting.  Physiatrist is providing close team supervision and 24 hour management of active medical problems listed below.  Physiatrist and rehab team continue to assess barriers to discharge/monitor  patient progress toward functional and medical goals  Care Tool:  Bathing    Body parts bathed by patient: Right arm, Left arm, Chest, Abdomen, Face         Bathing assist Assist Level: Supervision/Verbal cueing     Upper Body Dressing/Undressing Upper body dressing   What is the patient wearing?: Pull over shirt    Upper body assist Assist Level: Set up assist    Lower Body Dressing/Undressing Lower body dressing      What is the patient wearing?: Pants, Underwear/pull up     Lower body assist Assist for lower body dressing: Set up assist     Toileting Toileting    Toileting assist Assist for toileting: Supervision/Verbal cueing     Transfers Chair/bed transfer  Transfers assist     Chair/bed transfer assist level: Contact Guard/Touching assist     Locomotion Ambulation   Ambulation assist      Assist level: Supervision/Verbal cueing Assistive device: No Device Max distance: 15'   Walk 10 feet activity   Assist     Assist level: Supervision/Verbal cueing Assistive device: No Device   Walk 50 feet activity   Assist    Assist level: Contact Guard/Touching assist Assistive device: No Device    Walk 150 feet activity   Assist Walk 150 feet activity did not occur: Safety/medical concerns         Walk 10 feet on uneven surface  activity   Assist Walk 10 feet on uneven surfaces activity did not occur: Safety/medical concerns         Wheelchair  Assist Will patient use wheelchair at discharge?: No(TBD but do not anticipate will need w/c unless for community mobility)             Wheelchair 50 feet with 2 turns activity    Assist            Wheelchair 150 feet activity     Assist          Medical Problem List and Plan: 1.   Debility secondary to pneumonia causing acute exacerbation of chronic respiratory failure, now with cardiorenal syndrome  Continue CIR as tolerated, doing well with  therapies, predominant limited by endurance, hope to DC soon once dialysis plans in place  Appreciate palliative care recs  Appreciate nephro follow-up-patient is now amenable to HD, will follow up with nephro.  Will discuss with PA. 2. Antithrombotics: -DVT/anticoagulation:Pharmaceutical:Other (comment)--Elquis -antiplatelet therapy: N/A 3. Pain Management:N/A 4. Mood:LCSW to follow for evaluation and support. -antipsychotic agents: N/A 5. Neuropsych: This patientiscapable of making decisions on hisown behalf. 6. Skin/Wound Care:Routine pressure relief measures. 7. Fluids/Electrolytes/Nutrition:strict I/O.  8. COPD with chronic hypoxic failure:Pulmicort bid with 4liters oxygenduring the day and VM- 8L at nights. Wean to least amount needed to keep saturations 88-92%. Will need PCCM to evaluate oxygen needs/set sleep study prior to discharge. PRN albuterol  Chest x-ray on 6/13 reviewed-showing improvement, repeat chest x-ray reviewed, relatively stable 9. Aspiration PNA:Has completed a week course ofAugmentin.Afebrile without cough  10: Afib/Aflutter:Now back on Eliquis--for DCCVin the futureafter pulmonary status improves?  Will need follow-up. 11. Acute on Chronic systolic:On [place].Monitor daily weights as well as strict I/O. Filed Weights   06/05/19 0800 06/06/19 0500 06/07/19 0258  Weight: 103.8 kg 104.7 kg 105 kg   Continues to trend up on 6/18  Appreciate nephro recs 12. CKD stage NT:ZGYFVCBS by Dr. Moshe Cipro.Has refused HD creat up to 4.5, appreciate nephrology follow-up.    Renal ultrasound suggesting medical renal disease  Creatinine 5.64 on 6/18 13. Sick Euthyroid?: Will need repeat TSH in 3-4 weeks 14.  Hyperkalemia: Resolved  Potassium 4.3 on 6/18  Discussed with pharmacy  15.  Leukocytosis  WBCs 11.7 on 6/11  LOS: 8 days A FACE TO FACE EVALUATION WAS PERFORMED  Winton Offord Lorie Phenix 06/07/2019, 9:32 AM

## 2019-06-08 LAB — RENAL FUNCTION PANEL
Albumin: 2.5 g/dL — ABNORMAL LOW (ref 3.5–5.0)
Anion gap: 13 (ref 5–15)
BUN: 131 mg/dL — ABNORMAL HIGH (ref 8–23)
CO2: 29 mmol/L (ref 22–32)
Calcium: 8.7 mg/dL — ABNORMAL LOW (ref 8.9–10.3)
Chloride: 100 mmol/L (ref 98–111)
Creatinine, Ser: 5.56 mg/dL — ABNORMAL HIGH (ref 0.61–1.24)
GFR calc Af Amer: 10 mL/min — ABNORMAL LOW (ref >60)
GFR calc non Af Amer: 9 mL/min — ABNORMAL LOW (ref >60)
Glucose, Bld: 104 mg/dL — ABNORMAL HIGH (ref 70–99)
Phosphorus: 7.1 mg/dL — ABNORMAL HIGH (ref 2.5–4.6)
Potassium: 3.7 mmol/L (ref 3.5–5.1)
Sodium: 142 mmol/L (ref 135–145)

## 2019-06-08 LAB — CBC
HCT: 29 % — ABNORMAL LOW (ref 39.0–52.0)
Hemoglobin: 8.6 g/dL — ABNORMAL LOW (ref 13.0–17.0)
MCH: 32 pg (ref 26.0–34.0)
MCHC: 29.7 g/dL — ABNORMAL LOW (ref 30.0–36.0)
MCV: 107.8 fL — ABNORMAL HIGH (ref 80.0–100.0)
Platelets: 231 10*3/uL (ref 150–400)
RBC: 2.69 MIL/uL — ABNORMAL LOW (ref 4.22–5.81)
RDW: 18.2 % — ABNORMAL HIGH (ref 11.5–15.5)
WBC: 8.7 10*3/uL (ref 4.0–10.5)
nRBC: 0.2 % (ref 0.0–0.2)

## 2019-06-08 LAB — HEPATITIS C ANTIBODY (REFLEX): HCV Ab: 0.1 s/co ratio (ref 0.0–0.9)

## 2019-06-08 LAB — PROTIME-INR
INR: 1.6 — ABNORMAL HIGH (ref 0.8–1.2)
Prothrombin Time: 18.6 seconds — ABNORMAL HIGH (ref 11.4–15.2)

## 2019-06-08 LAB — VITAMIN B12: Vitamin B-12: 1029 pg/mL — ABNORMAL HIGH (ref 180–914)

## 2019-06-08 LAB — HEPATITIS B SURFACE ANTIGEN: Hepatitis B Surface Ag: NEGATIVE

## 2019-06-08 LAB — HEPATITIS B SURFACE ANTIBODY,QUALITATIVE: Hep B S Ab: NONREACTIVE

## 2019-06-08 LAB — GLUCOSE, CAPILLARY: Glucose-Capillary: 96 mg/dL (ref 70–99)

## 2019-06-08 LAB — HCV COMMENT:

## 2019-06-08 MED ORDER — CEFAZOLIN SODIUM-DEXTROSE 2-4 GM/100ML-% IV SOLN
2.0000 g | INTRAVENOUS | Status: AC
  Filled 2019-06-08: qty 100

## 2019-06-08 NOTE — Progress Notes (Signed)
Called back down to Radiology- MD going to place order for HD placement.

## 2019-06-08 NOTE — Progress Notes (Signed)
Patient wife would like call with update before and after procedure.

## 2019-06-08 NOTE — Progress Notes (Signed)
Held Aranesp - patient did not go to Hemodialysis today.

## 2019-06-08 NOTE — Progress Notes (Signed)
PROGRESS NOTE  David Barton QJJ:[id number] DOB: [birth year] DOA: 06/07/2019 PCP: Maury Dus, MD  HPI/Recap of past 24 hours:  HPI: David Barton is a 85 y.o. male with medical history significant of COPD/emphysema, chronic respiratory failure oxygen dependent A. fib/PSVT with history of cardioversion, HTN, systolic CHF with EF 48%, ESRD not on HD, alcohol abuse, history of colon cancer; who presents to start dialysis.  Patient recently been hospitalized from 5/30-6/10, with progressive hypoxia requiring BiPAP found to have moderate right pleural effusion which HCAP was suspected.  Patient was treated with empiric antibiotics and underwent thoracentesis on 6/1.  Cardiology has been consulted as well as pulmonary during his hospital stay.  On 6/8 as patient was found to be hypoxic with O2 saturations in the 40s requiring placement to nonrebreather.  He was titrated down to Ventimask and pulmonology tried nocturnal auto titration of his CPAP prior to being able to be discharged to inpatient rehab with CPAP.  Ultimately patient declined CPAP at night due to claustrophobia but tolerates high flow nasal cannula oxygen of 8 L at night.  During his hospital stay creatinine was noted to be elevated up to 3.7 and dialysis was discussed.  However, patient declined dialysis at that time.  Currently creatinine 5.64 with BUN 130.  Patient noted to have decreased ability to participate in rehab his shortness of breath and generalized weakness.  Since being in the hospital he is at the point where he takes all his energy just to stand up.  Nephrology had been seeing the patient while in rehab and after discussions with patient and family he agreed to dialysis.  Patient reports that he has been having more of a productive cough.  Denies any significant fevers or chills.  06/08/19: Patient seen and examined at his bedside.  He denies nausea or abdominal pain.  Still making urine 200 cc recorded in the last 24 hours.   Vascular surgery and nephrology following.  Plan for possible tunneled HD catheter placement by interventional radiology and starting hemodialysis during this admission.   Assessment/Plan: Principal Problem:   ESRD (end stage renal disease) ([place]) Active Problems:   COPD with acute exacerbation ([place])   Essential hypertension   Chronic respiratory failure with hypoxia ([name])   Leukocytosis   Macrocytic anemia  Newly diagnosed ESRD, will likely start hemodialysis during this admission Creatinine 5.5 with BUN of 131 Potassium and sodium level stable Nephrology following Nephrology arranging for HD spot. Plan for possible tunneled HD catheter placement by interventional radiology Still makes urine but minimal  COPD with chronic hypoxia On 2 L oxygen chronically Maintain O2 sat greater than 90% Continue inhalers and nebs  Paroxysmal A. fib/SVT on Eliquis Rate is controlled Continue amiodarone Eliquis is been held due to planned procedures as stated above  Microcytic anemia Hemoglobin 8.6 with elevated MCV No sign of overt bleeding Vitamin B12 normal Folate level pending  Chronic systolic CHF Last 2D echo done on 05/21/2019 showed LVEF 45 to 50% Continue daily weight Continue cardiac medications  Essential hypertension Blood pressure is normotensive Continue home medications  Resolved leukocytosis  Risks: High risk for decompensation due to renal failure requiring hemodialysis, multiple comorbidities and advanced age.  Patient will require at least 2 midnights for further evaluation and treatment of present condition.    DVT prophylaxis: SCDs; anticoagulants on hold due to planned procedure. Code Status: DNR Family Communication: No family present at bedside Disposition Plan:  Possibly return to [facility] when nephrology and vascular  surgery sign off and the patient has an HD spot.. Consults called: Nephrology    Objective: Vitals:   06/08/19 0046 06/08/19 0315 06/08/19  0500 06/08/19 1026  BP:  115/76  (!) 103/57  Pulse:  82  68  Resp:      Temp:  98.7 F (37.1 C)    TempSrc:  Axillary    SpO2:  (!) 86% 90% 90%  Weight: 103.1 kg       Intake/Output Summary (Last 24 hours) at 06/08/2019 1325 Last data filed at 06/08/2019 1030 Gross per 24 hour  Intake 240 ml  Output 900 ml  Net -660 ml   Filed Weights   06/08/19 0046  Weight: 103.1 kg    Exam:   General: 85 y.o. year-old male well developed well nourished in no acute distress.  Alert and oriented x3.  Cardiovascular: Regular rate and rhythm with no rubs or gallops.  No thyromegaly or JVD noted.    Respiratory: Clear to auscultation with no wheezes or rales. Good inspiratory effort.  Abdomen: Obese nontender nondistended with normal bowel sounds x4 quadrants.  Musculoskeletal: Trace lower extremity edema. 2/4 pulses in all 4 extremities.  Psychiatry: Mood is appropriate for condition and setting   Data Reviewed: CBC: Recent Labs  Lab 06/07/19 1531 06/08/19 0650  WBC 11.6* 8.7  HGB 8.6* 8.6*  HCT 28.7* 29.0*  MCV 107.5* 107.8*  PLT 248 614   Basic Metabolic Panel: Recent Labs  Lab 06/04/19 0616 06/04/19 1222 06/06/19 1301 06/07/19 0746 06/08/19 0650  NA 139 138 139 142 142  K 5.4* 5.9* 4.4 4.3 3.7  CL 100 98 99 101 100  CO2 25 27 25 29 29   GLUCOSE 106* 126* 146* 108* 104*  BUN 117* 120* 132* 130* 131*  CREATININE 5.61* 5.79* 5.81* 5.64* 5.56*  CALCIUM 8.8* 8.9 8.9 8.9 8.7*  PHOS  --  7.0* 6.9* 7.1* 7.1*   GFR: Estimated Creatinine Clearance: 12.1 mL/min (A) (by C-G formula based on SCr of 5.56 mg/dL (H)). Liver Function Tests: Recent Labs  Lab 06/04/19 1222 06/06/19 1301 06/07/19 0746 06/08/19 0650  ALBUMIN 2.6* 2.6* 2.5* 2.5*   No results for input(s): LIPASE, AMYLASE in the last 168 hours. No results for input(s): AMMONIA in the last 168 hours. Coagulation Profile: Recent Labs  Lab 06/07/19 1531  INR 2.0*   Cardiac Enzymes: No results for  input(s): CKTOTAL, CKMB, CKMBINDEX, TROPONINI in the last 168 hours. BNP (last 3 results) Recent Labs    05/04/19 1137  PROBNP 443.0*   HbA1C: No results for input(s): HGBA1C in the last 72 hours. CBG: Recent Labs  Lab 06/04/19 0621 06/04/19 2243 06/05/19 0743  GLUCAP 109* 132* 111*   Lipid Profile: No results for input(s): CHOL, HDL, LDLCALC, TRIG, CHOLHDL, LDLDIRECT in the last 72 hours. Thyroid Function Tests: No results for input(s): TSH, T4TOTAL, FREET4, T3FREE, THYROIDAB in the last 72 hours. Anemia Panel: Recent Labs    06/08/19 0650  VITAMINB12 1,029*   Urine analysis:    Component Value Date/Time   COLORURINE YELLOW 06/03/2019 1022   APPEARANCEUR HAZY (A) 06/03/2019 1022   LABSPEC 1.014 06/03/2019 1022   PHURINE 5.0 06/03/2019 1022   GLUCOSEU NEGATIVE 06/03/2019 1022   HGBUR NEGATIVE 06/03/2019 1022   BILIRUBINUR NEGATIVE 06/03/2019 1022   KETONESUR NEGATIVE 06/03/2019 1022   PROTEINUR 30 (A) 06/03/2019 1022   UROBILINOGEN 1.0 05/08/2013 1738   NITRITE NEGATIVE 06/03/2019 1022   LEUKOCYTESUR NEGATIVE 06/03/2019 1022   Sepsis Labs: @LABRCNTIP (procalcitonin:4,lacticidven:4)  )  Recent Results (from the past 240 hour(s))  SARS Coronavirus 2 (CEPHEID - Performed in [facility] hospital lab), Hosp Order     Status: None   Collection Time: 06/01/19  4:32 PM   Specimen: Nasopharyngeal Swab  Result Value Ref Range Status   SARS Coronavirus 2 NEGATIVE NEGATIVE Final    Comment: (NOTE) If result is NEGATIVE SARS-CoV-2 target nucleic acids are NOT DETECTED. The SARS-CoV-2 RNA is generally detectable in upper and lower  respiratory specimens during the acute phase of infection. The lowest  concentration of SARS-CoV-2 viral copies this assay can detect is 250  copies / mL. A negative result does not preclude SARS-CoV-2 infection  and should not be used as the sole basis for treatment or other  patient management decisions.  A negative result may occur with   improper specimen collection / handling, submission of specimen other  than nasopharyngeal swab, presence of viral mutation(s) within the  areas targeted by this assay, and inadequate number of viral copies  (<250 copies / mL). A negative result must be combined with clinical  observations, patient history, and epidemiological information. If result is POSITIVE SARS-CoV-2 target nucleic acids are DETECTED. The SARS-CoV-2 RNA is generally detectable in upper and lower  respiratory specimens dur ing the acute phase of infection.  Positive  results are indicative of active infection with SARS-CoV-2.  Clinical  correlation with patient history and other diagnostic information is  necessary to determine patient infection status.  Positive results do  not rule out bacterial infection or co-infection with other viruses. If result is PRESUMPTIVE POSTIVE SARS-CoV-2 nucleic acids MAY BE PRESENT.   A presumptive positive result was obtained on the submitted specimen  and confirmed on repeat testing.  While 2019 novel coronavirus  (SARS-CoV-2) nucleic acids may be present in the submitted sample  additional confirmatory testing may be necessary for epidemiological  and / or clinical management purposes  to differentiate between  SARS-CoV-2 and other Sarbecovirus currently known to infect humans.  If clinically indicated additional testing with an alternate test  methodology [phone]) is advised. The SARS-CoV-2 RNA is generally  detectable in upper and lower respiratory sp ecimens during the acute  phase of infection. The expected result is Negative. Fact Sheet for Patients:  StrictlyIdeas.no Fact Sheet for Healthcare Providers: BankingDealers.co.za This test is not yet approved or cleared by the [place] FDA and has been authorized for detection and/or diagnosis of SARS-CoV-2 by FDA under an Emergency Use Authorization (EUA).  This EUA will  remain in effect (meaning this test can be used) for the duration of the COVID-19 declaration under Section 564(b)(1) of the Act, 21 U.S.C. section 360bbb-3(b)(1), unless the authorization is terminated or revoked sooner. Performed at [facility], [place] [address], [place], [place] [address]   Culture, body fluid-bottle     Status: None   Collection Time: 06/02/19 10:46 AM   Specimen: Pleura  Result Value Ref Range Status   Specimen Description PLEURAL RIGHT  Final   Special Requests NONE  Final   Culture   Final    NO GROWTH 5 DAYS Performed at [facility] [address] [place],  [address]    Report Status 06/07/2019 FINAL  Final  Gram stain     Status: None   Collection Time: 06/02/19 10:46 AM   Specimen: Pleura  Result Value Ref Range Status   Specimen Description PLEURAL RIGHT  Final   Special Requests NONE  Final  Gram Stain   Final    RARE WBC PRESENT, PREDOMINANTLY PMN NO ORGANISMS SEEN Performed at [facility], [place] [address], [place], [place] [address]    Report Status 06/03/2019 FINAL  Final      Studies: No results found.  Scheduled Meds:  allopurinol  100 mg Oral Daily   amiodarone  200 mg Oral Daily   budesonide  0.25 mg Nebulization BID   darbepoetin (ARANESP) injection - DIALYSIS  150 mcg Intravenous Q Fri-HD   furosemide  160 mg Oral BID   guaiFENesin  600 mg Oral BID   ipratropium-albuterol  3 mL Nebulization BID   multivitamin  1 tablet Oral QHS    Continuous Infusions:  [START ON 06/11/2019]  ceFAZolin (ANCEF) IV       LOS: 1 day     Kayleen Memos, MD [facility] Pager [phone]  If 7PM-7AM, please contact night-coverage [email or web address] Password [email or web address] 06/08/2019, 1:25 PM

## 2019-06-08 NOTE — Progress Notes (Addendum)
Palliative Nurse called- Mr. Minus wife her very upset.  Very concerned patient needed Hemodialysis on an emergent basis.  Palliative nurse wanted to speak to unit director regarding trying to speed up process.     Unit director spoke with Palliative nurse.   Will call MD to get them to talk to patient's wife   Called Nephrology -  MD is going to call patient's wife

## 2019-06-08 NOTE — Progress Notes (Signed)
Mrs. Stecklein called-  Wanted an update on when patient was going to go down for procedure.   Explained what has happened thus far (following up with IR and Kidney Doctor) to try to see if we can still get in today, if not if he could have something to eat.   Let her know I will follow up with her as soon as I get an answer.    Wife was very concerned-  Was told that this was an emergent situation, that if patient did not get it he could pass away within 2 weeks to 4 months.     Explained that I have contacted Nephrology and IR multiple times and we are still trying to get him in today, but would call with up date as soon as I heard from them.

## 2019-06-08 NOTE — Progress Notes (Signed)
Radiology called - reported patient was suppose to come down to have HD Cath placed, however orders did not transfer over to [place] from IP rehab.     Would need to call MD to get them to place new orders.

## 2019-06-08 NOTE — Progress Notes (Signed)
Social Work Discharge Note  The overall goal for the admission was met for:   Discharge location: No - pt required tx to acute due to medical instability and decision to start dialysis and need to prepare pt for that.  Length of Stay: No  Discharge activity level: No - CGA/S mainly limited on getting to mod I goals due to his limited breathing/medical issues  Home/community participation: Yes  Services provided included: MD, RD, PT, OT, RN, Pharmacy and [place]: Private Insurance: [employer]  Follow-up services arranged: Other: Pt tx to acute for dialysis prep and medical stabilization.  Pt was to have [facility] once discharged.  He has home O2 from [employer].  Pt has walker, shower seat, and bedside commode.  Comments (or additional information):  Patient/Family verbalized understanding of follow-up arrangements: Other : pt tx to acute  Individual responsible for coordination of the follow-up plan: pt and his wife, Odin Mariani [phone]  Confirmed correct DME delivered: Trey Sailors 06/08/2019    Zadyn Yardley, Silvestre Mesi

## 2019-06-08 NOTE — Patient Care Conference (Signed)
Inpatient RehabilitationTeam Conference and Plan of Care Update Date: 06/06/2019   Time: 2:00 PM    Patient Name: David Barton      Medical Record Number: [id number]  Date of Birth: [birth year] Sex: Male         Room/Bed: 4M09C/4M09C-01 Payor Info: Payor: Marine scientist / Plan: [employer] MEDICARE / Product Type: *No Product type* /    Admitting Diagnosis: 5. Gen Team  Debility, malaise; 9-10 days  Admit Date/Time:  05/30/2019  4:50 PM Admission Comments: No comment available   Primary Diagnosis:  Acute on chronic respiratory failure ([name]) Principal Problem: Acute on chronic respiratory failure [facility])  Patient Active Problem List   Diagnosis Date Noted  . ESRD (end stage renal disease) ([place]) 06/07/2019  . Macrocytic anemia 06/07/2019  . Lung infiltrate   . Shortness of breath   . DNR (do not resuscitate)   . Palliative care by specialist   . S/P thoracentesis   . SOB (shortness of breath)   . Leukocytosis   . Chronic kidney disease (CKD), stage IV (severe) ([place])   . Acute on chronic combined systolic and diastolic heart failure ([place])   . Chronic obstructive pulmonary disease ([place])   . Debility 05/30/2019  . Acute on chronic respiratory failure ([place]) 05/19/2019  . Chronic respiratory failure with hypoxia and hypercapnia ([place]) 05/06/2019  . DOE (dyspnea on exertion) 05/04/2019  . HCAP (healthcare-associated pneumonia) 04/25/2019  . Chronic respiratory failure with hypoxia ([place]) 04/25/2019  . History of anemia due to chronic kidney disease 04/17/2019  . Pressure injury of skin 03/17/2019  . Atrial fibrillation ([place]) 03/11/2019  . Chronic diastolic heart failure ([place]) 01/17/2019  . Edema of both legs 01/02/2019  . Pulmonary edema   . Hypomagnesemia 11/15/2016  . Sleep apnea   . Sepsis ([place]) 11/10/2016  . Elevated troponin 11/10/2016  . Acute diastolic CHF (congestive heart failure) ([place]) 11/10/2016  . Cardiomyopathy ([place]) 11/10/2016  . PSVT (paroxysmal  supraventricular tachycardia) ([place]) 11/10/2016  . Dysphagia   . Renal insufficiency   . Enteritis due to Clostridium difficile   . Aspiration pneumonia ([place]) 11/04/2016  . Diarrhea 11/04/2016  . AKI (acute kidney injury) ([place]) 11/04/2016  . Hyperkalemia 11/04/2016  . Essential hypertension 05/31/2013  . COPD with acute exacerbation ([place]) 05/29/2013  . Hypokalemia 05/08/2013  . Hypotension 05/08/2013  . Dizziness 05/08/2013  . Hypoxia 05/08/2013  . Acute respiratory failure ([place]) 05/08/2013    Expected Discharge Date: Expected Discharge Date: (TBD)  Team Members Present: Physician leading conference: Dr. Delice Lesch Social Worker Present: Alfonse Alpers, LCSW Nurse Present: Isla Pence, RN PT Present: Roderic Ovens, PT OT Present: [name], OT SLP Present: Weston Anna, SLP PPS Coordinator present : Ileana Ladd, PT     Current Status/Progress Goal Weekly Team Focus  Medical   Debility secondary to pneumonia causing acute exacerbation of chronic respiratory failure with cardiorenal syndrome  Improve mobility, endurance, hyperkalemia, goals of care  See above   Bowel/Bladder   Continent bowel/bladder, Last BM 06/05/2019  Remain continent bowel/bladder  Assist with toileting needs PRN   Swallow/Nutrition/ Hydration             ADL's   CGA transfers, CGA to close supervision LB dressing;  very debilitated with fluctuating oxygen support and multiple medical concerns  Mod I overall; may downgrade to supervision depending on medical stability/d/c plan  activity tolerance/endurance, d/c planning   Mobility   supervision mobility and short distance gait  mod I  family  ed, d/c planning   Communication             Safety/Cognition/ Behavioral Observations            Pain   No complaints of pain  Remain pain free  Assess patient pain QS and PRN   Skin   MASD to groin bilaterally barrier cream applied, ecchymosis bilateral arms  Maintain skin integrity and promote  healing  Assess skin QS and PRN continue to apply topical barrier cream to groin    Rehab Goals Patient on target to meet rehab goals: Yes Rehab Goals Revised: none *See Care Plan and progress notes for long and short-term goals.     Barriers to Discharge  Current Status/Progress Possible Resolutions Date Resolved   Physician    Medical stability;Other (comments)  Need for HD, however, declining, follow up palliative care recs  See above  Therapies as tolerated, recs per Nephro/Palliative      Nursing                  PT                    OT                  SLP                SW                Discharge Planning/Teaching Needs:  Pt plans to go home with his wife to help him as needed, although he has mainly Mod I goals  Wife can come in for family education prior to d/c, as needed.   Team Discussion:  Pt and wife to talk with Palliative Care team and nephrologist to make a decision on starting dialysis or not.  If pt does not start, pt will likely need hospice care with weeks to live.  Pt is using 9% oxygen high flow device.  Pt is basically supervision for everything as far as function, but his medical status is what is holding him back, because he can't breathe.  Pt is CGA/S for self care.  Revisions to Treatment Plan:  none    Continued Need for Acute Rehabilitation Level of Care: The patient requires daily medical management by a physician with specialized training in physical medicine and rehabilitation for the following conditions: Daily direction of a multidisciplinary physical rehabilitation program to ensure safe treatment while eliciting the highest outcome that is of practical value to the patient.: Yes Daily medical management of patient stability for increased activity during participation in an intensive rehabilitation regime.: Yes Daily analysis of laboratory values and/or radiology reports with any subsequent need for medication adjustment of medical intervention  for : Cardiac problems;Pulmonary problems;Renal problems   I attest that I was present, lead the team conference, and concur with the assessment and plan of the team.Team conference was held via web/ teleconference due to [place] - 19.   Edina Winningham, Silvestre Mesi 06/08/2019, 9:30 AM

## 2019-06-08 NOTE — Progress Notes (Signed)
Renal Navigator received notification from Nephrologist/Dr. Deterding to complete OP HD referral for patient. Renal Navigator completed assessment with patient's wife/Carol and has submitted referral to OP HD clinic/[facility] due to preference for second shift and desire to transition to Home [facility] as soon as possible. (Patient's wife reports that patient will not attend treatment if he is given a first shift seat. Currently there are no first shift seats available at [facility] to patient's home address). Renal Navigator will follow up with Nephrologist and patient's wife once patient has been accepted and a seat schedule has been secured.  Alphonzo Cruise, [place] Renal Navigator [phone]

## 2019-06-08 NOTE — Progress Notes (Addendum)
[facility]    NEPHROLOGY PROGRESS NOTE  SUBJECTIVE: Seen and examined this morning.  Patient with no acute complaints.  Denies chest pain, shortness of breath, nausea, vomiting, diarrhea or dysuria.  All other review of systems are negative.  Weight is downtrending.  INR was 2 yesterday.  Discussed with interventional radiology.  Will repeat INR.  May need to delay permacath placement.  Spoke with wife and updated her regarding the plan.    OBJECTIVE:  Vitals:   06/08/19 0500 06/08/19 1026  BP:  (!) 103/57  Pulse:  68  Resp:    Temp:    SpO2: 90% 90%    Intake/Output Summary (Last 24 hours) at 06/08/2019 1439 Last data filed at 06/08/2019 1404 Gross per 24 hour  Intake 480 ml  Output 1200 ml  Net -720 ml      General:  AAOx3 NAD HEENT: MMM [place] AT anicteric sclera Neck:  No JVD, no adenopathy CV:  Heart RRR  Lungs:  L/S with coarse breath sounds bilaterally Abd:  abd SNT/ND with normal BS GU:  Bladder non-palpable Extremities: +1 bilateral lower extremity edema. Skin:  No skin rash  MEDICATIONS:  . allopurinol  100 mg Oral Daily  . amiodarone  200 mg Oral Daily  . budesonide  0.25 mg Nebulization BID  . darbepoetin (ARANESP) injection - DIALYSIS  150 mcg Intravenous Q Fri-HD  . furosemide  160 mg Oral BID  . guaiFENesin  600 mg Oral BID  . ipratropium-albuterol  3 mL Nebulization BID  . multivitamin  1 tablet Oral QHS       LABS:   CBC Latest Ref Rng & Units 06/08/2019 06/07/2019 05/31/2019  WBC 4.0 - 10.5 K/uL 8.7 11.6(H) 11.7(H)  Hemoglobin 13.0 - 17.0 g/dL 8.6(L) 8.6(L) 9.3(L)  Hematocrit 39.0 - 52.0 % 29.0(L) 28.7(L) 31.8(L)  Platelets 150 - 400 K/uL 231 248 262    CMP Latest Ref Rng & Units 06/08/2019 06/07/2019 06/06/2019  Glucose 70 - 99 mg/dL 104(H) 108(H) 146(H)  BUN 8 - 23 mg/dL 131(H) 130(H) 132(H)  Creatinine 0.61 - 1.24 mg/dL 5.56(H) 5.64(H) 5.81(H)  Sodium 135 - 145 mmol/L 142 142 139  Potassium 3.5 - 5.1 mmol/L 3.7 4.3 4.4   Chloride 98 - 111 mmol/L 100 101 99  CO2 22 - 32 mmol/L 29 29 25   Calcium 8.9 - 10.3 mg/dL 8.7(L) 8.9 8.9  Total Protein 6.5 - 8.1 g/dL - - -  Total Bilirubin 0.3 - 1.2 mg/dL - - -  Alkaline Phos 38 - 126 U/L - - -  AST 15 - 41 U/L - - -  ALT 0 - 44 U/L - - -    Lab Results  Component Value Date   PTH 67 (H) 03/20/2019   CALCIUM 8.7 (L) 06/08/2019   CAION 1.24 05/23/2019   PHOS 7.1 (H) 06/08/2019       Component Value Date/Time   COLORURINE YELLOW 06/03/2019 1022   APPEARANCEUR HAZY (A) 06/03/2019 1022   LABSPEC 1.014 06/03/2019 1022   PHURINE 5.0 06/03/2019 1022   GLUCOSEU NEGATIVE 06/03/2019 1022   HGBUR NEGATIVE 06/03/2019 1022   BILIRUBINUR NEGATIVE 06/03/2019 1022   KETONESUR NEGATIVE 06/03/2019 1022   PROTEINUR 30 (A) 06/03/2019 1022   UROBILINOGEN 1.0 05/08/2013 1738   NITRITE NEGATIVE 06/03/2019 1022   LEUKOCYTESUR NEGATIVE 06/03/2019 1022      Component Value Date/Time   PHART 7.417 05/23/2019 0341   PCO2ART 49.8 (H) 05/23/2019 0341   PO2ART 56.0 (L) 05/23/2019 0341  HCO3 32.2 (H) 05/23/2019 0341   TCO2 34 (H) 05/23/2019 0341   ACIDBASEDEF 3.0 (H) 03/05/2019 0317   O2SAT 89.0 05/23/2019 0341       Component Value Date/Time   IRON 35 (L) 06/02/2019 0411   TIBC 214 (L) 06/02/2019 0411   FERRITIN 826 (H) 06/02/2019 0411   IRONPCTSAT 16 (L) 06/02/2019 0411       ASSESSMENT/PLAN:    85 year old male patient with past medical history significant for COPD, chronic respiratory failure on oxygen, atrial fibrillation status post cardioversion, hypertension, systolic congestive heart failure with an ejection fraction of 30%, and chronic kidney disease who presented with progressive hypoxia requiring BiPAP.  He was found to have a moderate right pleural effusion.  He was suspected to have healthcare associated pneumonia.  He has been requiring significant amounts of oxygen supplementation.  He was noted to have worsening renal function.  1.  End-stage renal  disease.  His creatinine has been in the fives.  BUN has been around 150.  While there is no urgent indication for dialysis today with his normal bicarb and potassium, will plan for dialysis initiation in the next day or 2.  Awaiting permacath placement.  Will repeat INR due to the most recent INR being 2.0.  2.  Acute on chronic respiratory failure.  Oxygenation is stable.  Continue oxygen supplementation.  3.  Chronic heart failure with reduced ejection fraction.  Will continue diuresis.  4.  Anemia of chronic kidney disease.  Continue to darbopoietin.   [place], DO, [place]

## 2019-06-08 NOTE — Progress Notes (Signed)
Called IR to check on what time patient scheduled to go down for Cath placement.   They still could not see orders.    They are going to look into why.    Order was not placed for IR Fluoro Guided CV line but place for insertion of hemodialysis catheter-  Order needed to be changed.     Called Nephrology-  Let MD know order need to be change - was not showing up on IR list.     MD to look into it and place order.

## 2019-06-08 NOTE — Progress Notes (Signed)
Called down to interventional radiology.  They may be able to get procedure done today.  Will continue NPO order.

## 2019-06-08 NOTE — Progress Notes (Signed)
Paged MD ([facility]).     Dr. Nevada Crane on the floor- explained what happened with order for IR to place HD cath.   Per MD will need to call Nephrologist to get another order placed.     Paged kidney MD  MD going to place order for patient for HD cath placement.

## 2019-06-08 NOTE — Progress Notes (Signed)
Kidney doctor return call-  Reports they are going to do the HD cath in the morning.  Diet order placed, will let patient know ok to get something to eat.

## 2019-06-09 ENCOUNTER — Encounter (HOSPITAL_COMMUNITY): Payer: Self-pay | Admitting: Diagnostic Radiology

## 2019-06-09 ENCOUNTER — Inpatient Hospital Stay (HOSPITAL_COMMUNITY): Payer: Medicare Other

## 2019-06-09 ENCOUNTER — Encounter (HOSPITAL_COMMUNITY): Payer: Medicare Other

## 2019-06-09 HISTORY — PX: IR US GUIDE VASC ACCESS RIGHT: IMG2390

## 2019-06-09 HISTORY — PX: IR FLUORO GUIDE CV LINE RIGHT: IMG2283

## 2019-06-09 LAB — PROTIME-INR
INR: 1.5 — ABNORMAL HIGH (ref 0.8–1.2)
Prothrombin Time: 17.6 seconds — ABNORMAL HIGH (ref 11.4–15.2)

## 2019-06-09 LAB — SURGICAL PCR SCREEN
MRSA, PCR: NEGATIVE
Staphylococcus aureus: NEGATIVE

## 2019-06-09 LAB — TSH: TSH: 3.913 u[IU]/mL (ref 0.350–4.500)

## 2019-06-09 LAB — VITAMIN B12: Vitamin B-12: 1085 pg/mL — ABNORMAL HIGH (ref 180–914)

## 2019-06-09 MED ORDER — FENTANYL CITRATE (PF) 100 MCG/2ML IJ SOLN
INTRAMUSCULAR | Status: AC
  Filled 2019-06-09: qty 2

## 2019-06-09 MED ORDER — CHLORHEXIDINE GLUCONATE CLOTH 2 % EX PADS
6.0000 | MEDICATED_PAD | Freq: Every day | CUTANEOUS | Status: DC
  Administered 2019-06-09 – 2019-06-12 (×3): 6 via TOPICAL

## 2019-06-09 MED ORDER — HEPARIN SODIUM (PORCINE) 1000 UNIT/ML DIALYSIS: 1000.0000 [IU] | INTRAMUSCULAR | Status: DC | PRN

## 2019-06-09 MED ORDER — SODIUM CHLORIDE 0.9 % IV SOLN: 100.0000 mL | INTRAVENOUS | Status: DC | PRN

## 2019-06-09 MED ORDER — DARBEPOETIN ALFA 150 MCG/0.3ML IJ SOSY
150.0000 ug | PREFILLED_SYRINGE | INTRAMUSCULAR | Status: DC
  Administered 2019-06-09: 150 ug via INTRAVENOUS
  Filled 2019-06-09 (×2): qty 0.3

## 2019-06-09 MED ORDER — MIDAZOLAM HCL 2 MG/2ML IJ SOLN
INTRAMUSCULAR | Status: AC
  Filled 2019-06-09: qty 2

## 2019-06-09 MED ORDER — FENTANYL CITRATE (PF) 100 MCG/2ML IJ SOLN
INTRAMUSCULAR | Status: AC | PRN
  Administered 2019-06-09: 25 ug via INTRAVENOUS

## 2019-06-09 MED ORDER — MIDAZOLAM HCL 2 MG/2ML IJ SOLN
INTRAMUSCULAR | Status: AC | PRN
  Administered 2019-06-09: 0.5 mg via INTRAVENOUS

## 2019-06-09 MED ORDER — SEVELAMER CARBONATE 800 MG PO TABS
800.0000 mg | ORAL_TABLET | Freq: Three times a day (TID) | ORAL | Status: DC
  Administered 2019-06-09 – 2019-06-15 (×13): 800 mg via ORAL
  Filled 2019-06-09 (×13): qty 1

## 2019-06-09 MED ORDER — LIDOCAINE HCL 1 % IJ SOLN
INTRAMUSCULAR | Status: AC
  Filled 2019-06-09: qty 20

## 2019-06-09 MED ORDER — GELATIN ABSORBABLE 12-7 MM EX MISC
CUTANEOUS | Status: AC
  Filled 2019-06-09: qty 1

## 2019-06-09 MED ORDER — HEPARIN SODIUM (PORCINE) 1000 UNIT/ML IJ SOLN
INTRAMUSCULAR | Status: AC
  Filled 2019-06-09: qty 1

## 2019-06-09 MED ORDER — ALTEPLASE 2 MG IJ SOLR: 2.0000 mg | Freq: Once | INTRAMUSCULAR | Status: DC | PRN

## 2019-06-09 MED ORDER — CEFAZOLIN SODIUM-DEXTROSE 2-4 GM/100ML-% IV SOLN
INTRAVENOUS | Status: AC
  Administered 2019-06-09: 2000 mg
  Filled 2019-06-09: qty 100

## 2019-06-09 MED ORDER — HEPARIN SODIUM (PORCINE) 1000 UNIT/ML IJ SOLN
INTRAMUSCULAR | Status: AC
  Filled 2019-06-09: qty 4

## 2019-06-09 MED ORDER — LIDOCAINE HCL 1 % IJ SOLN
INTRAMUSCULAR | Status: AC | PRN
  Administered 2019-06-09: 15 mL

## 2019-06-09 NOTE — Progress Notes (Signed)
Paged MD  Arsenio Loader about expired teli order.

## 2019-06-09 NOTE — Progress Notes (Signed)
Pt is alert and oriented X4. Refusing bed alarm to be turned on.

## 2019-06-09 NOTE — Plan of Care (Signed)
Nutrition Education Note  RD working remotely.   RD consulted for Renal Education.   Spoke with pt wife over the phone, as she requested consult. Pt wife expressed frustration over not being educated on a renal diet until pt was HD-dependent. Pt follows a low sodium diet at home and pt wife does the cooking- focus is on fresh fruits and vegetables, as well as fresh herbs. Pt wife reports her and her husband have been eating this way going on 40 years. Pt has a good appetite and typical intake is raisin bran, milk, and banana for breakfast; greek yogurt with blueberries and raspberries and sandwich with low sodium swiss cheese, low sodium [place], and mayonnaise; dinner is steak, hamburgers, and vegetables. Pt drinks mainly water, gingerale, and club soda.    Provided "Nutrition Guidelines for Adults Starting Hemodialysis" handout from [facility]; handout and websites ([place]- [place].com.br;  [employer]- RepJet.co.nz; and [place].org) email to pt wife per her request ([email or web address]). Reviewed food groups and provided written recommended serving sizes specifically determined for patient's current nutritional status.   Explained why diet restrictions are needed and provided lists of foods to limit/avoid that are high potassium, sodium, and phosphorus. Provided specific recommendations on safer alternatives of these foods. Strongly encouraged compliance of this diet.   Discussed importance of protein intake at each meal and snack. Provided examples of how to maximize protein intake throughout the day. Discussed need for fluid restriction with dialysis, importance of minimizing weight gain between HD treatments, and renal-friendly beverage options.  Encouraged pt to discuss specific diet questions/concerns with RD at HD outpatient facility. Teach back method used.  Expect fair to good compliance.  Body mass index is 31.54 kg/m. Pt meets criteria for  obesity, class I based on current BMI.  Current diet order is renal/ carb modified with 1.2 L fluid restriction, patient is consuming approximately 100% of meals at this time. Labs and medications reviewed. No further nutrition interventions warranted at this time. RD contact information provided. If additional nutrition issues arise, please re-consult RD.  Theodora Lalanne A. Jimmye Norman, RD, LDN, [place] Registered Dietitian II Certified Diabetes Care and Education Specialist Pager: [phone] After hours Pager: [phone]

## 2019-06-09 NOTE — Progress Notes (Signed)
[facility]    NEPHROLOGY PROGRESS NOTE  SUBJECTIVE: Patient seen and examined.  Patient with no acute complaints.  Denies chest pain, shortness of breath, nausea, vomiting, diarrhea or dysuria.  All other review of systems are negative.  Weight is downtrending.  Had a tunneled catheter placed today.  Is scheduled for dialysis later today.  OBJECTIVE:  Vitals:   06/09/19 1100 06/09/19 1133  BP: (!) 113/56 112/67  Pulse: 60 80  Resp: 20 20  Temp:    SpO2: 92% 90%    Intake/Output Summary (Last 24 hours) at 06/09/2019 1453 Last data filed at 06/09/2019 0100 Gross per 24 hour  Intake 600 ml  Output 450 ml  Net 150 ml      General:  AAOx3 NAD HEENT: MMM [place] AT anicteric sclera Neck:  No JVD, no adenopathy CV:  Heart RRR  Lungs: Decreased breath sounds on the right, left basilar crackles Abd:  abd SNT/ND with normal BS GU:  Bladder non-palpable Extremities: +1 bilateral lower extremity edema. Skin:  No skin rash  MEDICATIONS:  . allopurinol  100 mg Oral Daily  . amiodarone  200 mg Oral Daily  . budesonide  0.25 mg Nebulization BID  . Chlorhexidine Gluconate Cloth  6 each Topical Q0600  . darbepoetin (ARANESP) injection - DIALYSIS  150 mcg Intravenous Q Sat-HD  . fentaNYL      . furosemide  160 mg Oral BID  . gelatin adsorbable      . guaiFENesin  600 mg Oral BID  . heparin      . ipratropium-albuterol  3 mL Nebulization BID  . lidocaine      . midazolam      . multivitamin  1 tablet Oral QHS       LABS:   CBC Latest Ref Rng & Units 06/08/2019 06/07/2019 05/31/2019  WBC 4.0 - 10.5 K/uL 8.7 11.6(H) 11.7(H)  Hemoglobin 13.0 - 17.0 g/dL 8.6(L) 8.6(L) 9.3(L)  Hematocrit 39.0 - 52.0 % 29.0(L) 28.7(L) 31.8(L)  Platelets 150 - 400 K/uL 231 248 262    CMP Latest Ref Rng & Units 06/08/2019 06/07/2019 06/06/2019  Glucose 70 - 99 mg/dL 104(H) 108(H) 146(H)  BUN 8 - 23 mg/dL 131(H) 130(H) 132(H)  Creatinine 0.61 - 1.24 mg/dL 5.56(H) 5.64(H) 5.81(H)  Sodium 135  - 145 mmol/L 142 142 139  Potassium 3.5 - 5.1 mmol/L 3.7 4.3 4.4  Chloride 98 - 111 mmol/L 100 101 99  CO2 22 - 32 mmol/L 29 29 25   Calcium 8.9 - 10.3 mg/dL 8.7(L) 8.9 8.9  Total Protein 6.5 - 8.1 g/dL - - -  Total Bilirubin 0.3 - 1.2 mg/dL - - -  Alkaline Phos 38 - 126 U/L - - -  AST 15 - 41 U/L - - -  ALT 0 - 44 U/L - - -    Lab Results  Component Value Date   PTH 67 (H) 03/20/2019   CALCIUM 8.7 (L) 06/08/2019   CAION 1.24 05/23/2019   PHOS 7.1 (H) 06/08/2019       Component Value Date/Time   COLORURINE YELLOW 06/03/2019 1022   APPEARANCEUR HAZY (A) 06/03/2019 1022   LABSPEC 1.014 06/03/2019 1022   PHURINE 5.0 06/03/2019 1022   GLUCOSEU NEGATIVE 06/03/2019 1022   HGBUR NEGATIVE 06/03/2019 1022   BILIRUBINUR NEGATIVE 06/03/2019 1022   KETONESUR NEGATIVE 06/03/2019 1022   PROTEINUR 30 (A) 06/03/2019 1022   UROBILINOGEN 1.0 05/08/2013 1738   NITRITE NEGATIVE 06/03/2019 1022   LEUKOCYTESUR NEGATIVE 06/03/2019 1022  Component Value Date/Time   PHART 7.417 05/23/2019 0341   PCO2ART 49.8 (H) 05/23/2019 0341   PO2ART 56.0 (L) 05/23/2019 0341   HCO3 32.2 (H) 05/23/2019 0341   TCO2 34 (H) 05/23/2019 0341   ACIDBASEDEF 3.0 (H) 03/05/2019 0317   O2SAT 89.0 05/23/2019 0341       Component Value Date/Time   IRON 35 (L) 06/02/2019 0411   TIBC 214 (L) 06/02/2019 0411   FERRITIN 826 (H) 06/02/2019 0411   IRONPCTSAT 16 (L) 06/02/2019 0411       ASSESSMENT/PLAN:    85 year old male patient with past medical history significant for COPD, chronic respiratory failure on oxygen, atrial fibrillation status post cardioversion, hypertension, systolic congestive heart failure with an ejection fraction of 30%, and chronic kidney disease who presented with progressive hypoxia requiring BiPAP.  He was found to have a moderate right pleural effusion.  He was suspected to have healthcare associated pneumonia.  He has been requiring significant amounts of oxygen supplementation.  He was  noted to have worsening renal function.  1.  End-stage renal disease.  Status post permacath placement.  Will ask vascular surgery to see for permanent access.  For first dialysis treatment today.  2.  Acute on chronic respiratory failure.  Oxygenation is stable.  Continue oxygen supplementation.  3.  Chronic heart failure with reduced ejection fraction.  Will continue diuresis.  Should be better managed on dialysis.  4.  Anemia of chronic kidney disease.  Continue to darbopoietin.  5.  Metabolic bone disease.  Check PTH.  Add phosphate binder.  6.  Large right pleural effusion.  Hopefully will improve with dialysis.   [place], DO, [place]

## 2019-06-09 NOTE — Progress Notes (Signed)
Pt is going down for procedure, will call wife and notified her

## 2019-06-09 NOTE — Progress Notes (Signed)
Order given to renew cardiac teli per MD.   New order placed.

## 2019-06-09 NOTE — Progress Notes (Signed)
PROGRESS NOTE    David Barton  IRW:[id number] DOB: [birth year] DOA: 06/07/2019 PCP: Maury Dus, MD   Brief Narrative:  85 year old with past medical history of COPD/emphysema, chronic hypoxia on home 2L O2, atrial fibrillation, systolic CHF ejection fraction 30%, essential hypertension, CKD stage V, colon cancer presented to the hospital to start hemodialysis.  Recently for a moderate right-sided pleural effusion requiring IV antibiotic treatment and thoracentesis.  Initially required significant amount of oxygen during the hospitalization.  Permacath placement and then fistula placement.  IR and vascular following.   Assessment & Plan:   Principal Problem:   ESRD (end stage renal disease) ([place]) Active Problems:   COPD with acute exacerbation ([name])   Essential hypertension   Chronic respiratory failure with hypoxia ([name])   Leukocytosis   Macrocytic anemia  Worsening Renal Function, New Onset ESRD on HD with electrolyte Abnormality Mild dyspnea from fluid overload.  -HD Tunneled catheter placed on 6/20, plan for HD per Nephro team now. Cont to monitor output and Renal Function. Monitor Lytes and BUN.  -Hep B and Hep C is negative.  -lasix 160mg  po bid  COPD with chronic hypoxia 2L O2 -bronchodilators prn.   P Afib, on Eliquis  -on Amiodarone, AC on Hold due to planned procedures.  F/u outptn Dr Gwenlyn Found  Anemia of Chronic Disease  -On Darbopoeitin, Monitor Renal Function. Check B12, TSH, and Folate.   Chronic Systolic CHF, ef 76% -needs to follow up outpatient with Cardiology. Need to optimize his medicaitons. Up until now not a candidate for ACE/ARB but if he is going to be permanent HD, then we can reconsider.   Essential HTN - Only on BB at home, currently on hold.      DVT prophylaxis: SCDs, resume AC once done with his planned procedures.  Code Status: DNR Family Communication:  None  Disposition Plan: Maintain hosp stay, he need inpatient HD to further improve  electrolytes.   Consultants:   Nephro   IR  Procedures:   Tunnelled cath placement 6/20  Antimicrobials:   None   Subjective: Seen after his procedure, he tolerated it well.  Upon speaking to him in long sentences, he appears mildly dyspnic   Review of Systems Otherwise negative except as per HPI, including: General: Denies fever, chills, night sweats or unintended weight loss. Resp: Denies cough, wheezing, shortness of breath. Cardiac: Denies chest pain, palpitations, orthopnea, paroxysmal nocturnal dyspnea. GI: Denies abdominal pain, nausea, vomiting, diarrhea or constipation GU: Denies dysuria, frequency, hesitancy or incontinence MS: Denies muscle aches, joint pain or swelling Neuro: Denies headache, neurologic deficits (focal weakness, numbness, tingling), abnormal gait Psych: Denies anxiety, depression, SI/HI/AVH Skin: Denies new rashes or lesions ID: Denies sick contacts, exotic exposures, travel  Objective: Vitals:   06/08/19 2009 06/09/19 0100 06/09/19 0520 06/09/19 0722  BP: (!) 119/57  127/66   Pulse: 73  71   Resp: 20  16   Temp: (!) 97.5 F (36.4 C)  97.9 F (36.6 C)   TempSrc: Oral  Oral   SpO2: 94%  90% 93%  Weight:  99.7 kg      Intake/Output Summary (Last 24 hours) at 06/09/2019 0802 Last data filed at 06/09/2019 0100 Gross per 24 hour  Intake 1080 ml  Output 1150 ml  Net -70 ml   Filed Weights   06/08/19 0046 06/09/19 0100  Weight: 103.1 kg 99.7 kg    Examination:  General exam: Appears calm and comfortable  Respiratory system: b/l basilar crackles.  Cardiovascular system:  S1 & S2 heard, RRR. No JVD, murmurs, rubs, gallops or clicks. No pedal edema. Gastrointestinal system: Abdomen is nondistended, soft and nontender. No organomegaly or masses felt. Normal bowel sounds heard. Central nervous system: Alert and oriented. No focal neurological deficits. Extremities: Symmetric 5 x 5 power. Skin: No rashes, lesions or ulcers Psychiatry:  Judgement and insight appear normal. Mood & affect appropriate.  Right IJ tunneled catheter noted.    Data Reviewed:   CBC: Recent Labs  Lab 06/07/19 1531 06/08/19 0650  WBC 11.6* 8.7  HGB 8.6* 8.6*  HCT 28.7* 29.0*  MCV 107.5* 107.8*  PLT 248 671   Basic Metabolic Panel: Recent Labs  Lab 06/04/19 0616 06/04/19 1222 06/06/19 1301 06/07/19 0746 06/08/19 0650  NA 139 138 139 142 142  K 5.4* 5.9* 4.4 4.3 3.7  CL 100 98 99 101 100  CO2 25 27 25 29 29   GLUCOSE 106* 126* 146* 108* 104*  BUN 117* 120* 132* 130* 131*  CREATININE 5.61* 5.79* 5.81* 5.64* 5.56*  CALCIUM 8.8* 8.9 8.9 8.9 8.7*  PHOS  --  7.0* 6.9* 7.1* 7.1*   GFR: Estimated Creatinine Clearance: 11.9 mL/min (A) (by C-G formula based on SCr of 5.56 mg/dL (H)). Liver Function Tests: Recent Labs  Lab 06/04/19 1222 06/06/19 1301 06/07/19 0746 06/08/19 0650  ALBUMIN 2.6* 2.6* 2.5* 2.5*   No results for input(s): LIPASE, AMYLASE in the last 168 hours. No results for input(s): AMMONIA in the last 168 hours. Coagulation Profile: Recent Labs  Lab 06/07/19 1531 06/08/19 1440  INR 2.0* 1.6*   Cardiac Enzymes: No results for input(s): CKTOTAL, CKMB, CKMBINDEX, TROPONINI in the last 168 hours. BNP (last 3 results) Recent Labs    05/04/19 1137  PROBNP 443.0*   HbA1C: No results for input(s): HGBA1C in the last 72 hours. CBG: Recent Labs  Lab 06/04/19 0621 06/04/19 2243 06/05/19 0743 06/08/19 1151  GLUCAP 109* 132* 111* 96   Lipid Profile: No results for input(s): CHOL, HDL, LDLCALC, TRIG, CHOLHDL, LDLDIRECT in the last 72 hours. Thyroid Function Tests: No results for input(s): TSH, T4TOTAL, FREET4, T3FREE, THYROIDAB in the last 72 hours. Anemia Panel: Recent Labs    06/08/19 0650  VITAMINB12 1,029*   Sepsis Labs: No results for input(s): PROCALCITON, LATICACIDVEN in the last 168 hours.  Recent Results (from the past 240 hour(s))  SARS Coronavirus 2 (CEPHEID - Performed in [place]  hospital lab), Hosp Order     Status: None   Collection Time: 06/01/19  4:32 PM   Specimen: Nasopharyngeal Swab  Result Value Ref Range Status   SARS Coronavirus 2 NEGATIVE NEGATIVE Final    Comment: (NOTE) If result is NEGATIVE SARS-CoV-2 target nucleic acids are NOT DETECTED. The SARS-CoV-2 RNA is generally detectable in upper and lower  respiratory specimens during the acute phase of infection. The lowest  concentration of SARS-CoV-2 viral copies this assay can detect is 250  copies / mL. A negative result does not preclude SARS-CoV-2 infection  and should not be used as the sole basis for treatment or other  patient management decisions.  A negative result may occur with  improper specimen collection / handling, submission of specimen other  than nasopharyngeal swab, presence of viral mutation(s) within the  areas targeted by this assay, and inadequate number of viral copies  (<250 copies / mL). A negative result must be combined with clinical  observations, patient history, and epidemiological information. If result is POSITIVE SARS-CoV-2 target nucleic acids are DETECTED. The SARS-CoV-2  RNA is generally detectable in upper and lower  respiratory specimens dur ing the acute phase of infection.  Positive  results are indicative of active infection with SARS-CoV-2.  Clinical  correlation with patient history and other diagnostic information is  necessary to determine patient infection status.  Positive results do  not rule out bacterial infection or co-infection with other viruses. If result is PRESUMPTIVE POSTIVE SARS-CoV-2 nucleic acids MAY BE PRESENT.   A presumptive positive result was obtained on the submitted specimen  and confirmed on repeat testing.  While 2019 novel coronavirus  (SARS-CoV-2) nucleic acids may be present in the submitted sample  additional confirmatory testing may be necessary for epidemiological  and / or clinical management purposes  to differentiate  between  SARS-CoV-2 and other Sarbecovirus currently known to infect humans.  If clinically indicated additional testing with an alternate test  methodology [id number]) is advised. The SARS-CoV-2 RNA is generally  detectable in upper and lower respiratory sp ecimens during the acute  phase of infection. The expected result is Negative. Fact Sheet for Patients:  StrictlyIdeas.no Fact Sheet for Healthcare Providers: BankingDealers.co.za This test is not yet approved or cleared by the [place] FDA and has been authorized for detection and/or diagnosis of SARS-CoV-2 by FDA under an Emergency Use Authorization (EUA).  This EUA will remain in effect (meaning this test can be used) for the duration of the COVID-19 declaration under Section 564(b)(1) of the Act, 21 U.S.C. section 360bbb-3(b)(1), unless the authorization is terminated or revoked sooner. Performed at [facility], [place] [address], [place], [place] [address]   Culture, body fluid-bottle     Status: None   Collection Time: 06/02/19 10:46 AM   Specimen: Pleura  Result Value Ref Range Status   Specimen Description PLEURAL RIGHT  Final   Special Requests NONE  Final   Culture   Final    NO GROWTH 5 DAYS Performed at [facility] [address], [place], [place] [address]    Report Status 06/07/2019 FINAL  Final  Gram stain     Status: None   Collection Time: 06/02/19 10:46 AM   Specimen: Pleura  Result Value Ref Range Status   Specimen Description PLEURAL RIGHT  Final   Special Requests NONE  Final   Gram Stain   Final    RARE WBC PRESENT, PREDOMINANTLY PMN NO ORGANISMS SEEN Performed at [facility], [place] [address], [place], [place] [address]    Report Status 06/03/2019 FINAL  Final         Radiology Studies: No results found.      Scheduled Meds: . allopurinol  100 mg Oral Daily  . amiodarone  200 mg Oral Daily  . budesonide  0.25 mg  Nebulization BID  . darbepoetin (ARANESP) injection - DIALYSIS  150 mcg Intravenous Q Fri-HD  . furosemide  160 mg Oral BID  . guaiFENesin  600 mg Oral BID  . ipratropium-albuterol  3 mL Nebulization BID  . multivitamin  1 tablet Oral QHS   Continuous Infusions: . [START ON 06/11/2019]  ceFAZolin (ANCEF) IV       LOS: 2 days   Time spent= 25 mins     Arsenio Loader, MD [facility]  If 7PM-7AM, please contact night-coverage [email or web address] 06/09/2019, 8:02 AM

## 2019-06-09 NOTE — Progress Notes (Signed)
Patient on the OR schedule for a permanent dialysis access on Monday.  We will continue to hold Eliquis.

## 2019-06-09 NOTE — Procedures (Signed)
Interventional Radiology Procedure:   Indications: ESRD and starting HD  Procedure: Tunneled HD catheter placement  Findings: Right jugular Palindrome (23 cm tip to cuff), tip at SVC/RA junction  Complications: None     EBL: Minimal, less than 10 ml  Plan: Catheter is ready to use.     Evola Hollis R. Anselm Pancoast, MD  Pager: [phone]

## 2019-06-09 NOTE — Plan of Care (Signed)
  Problem: Education: Goal: Knowledge of disease and its progression will improve Outcome: Progressing Goal: Individualized Educational Video(s) Outcome: Progressing   Problem: Fluid Volume: Goal: Compliance with measures to maintain balanced fluid volume will improve Outcome: Progressing   Problem: Health Behavior/Discharge Planning: Goal: Ability to manage health-related needs will improve Outcome: Progressing   Problem: Nutritional: Goal: Ability to make healthy dietary choices will improve Outcome: Progressing   Problem: Clinical Measurements: Goal: Complications related to the disease process, condition or treatment will be avoided or minimized Outcome: Progressing   Problem: Education: Goal: Knowledge of General Education information will improve Description: Including pain rating scale, medication(s)/side effects and non-pharmacologic comfort measures Outcome: Progressing   Problem: Health Behavior/Discharge Planning: Goal: Ability to manage health-related needs will improve Outcome: Progressing   Problem: Clinical Measurements: Goal: Ability to maintain clinical measurements within normal limits will improve Outcome: Progressing Goal: Will remain free from infection Outcome: Progressing Goal: Diagnostic test results will improve Outcome: Progressing Goal: Respiratory complications will improve Outcome: Progressing Goal: Cardiovascular complication will be avoided Outcome: Progressing   Problem: Activity: Goal: Risk for activity intolerance will decrease Outcome: Progressing   Problem: Nutrition: Goal: Adequate nutrition will be maintained Outcome: Progressing   Problem: Coping: Goal: Level of anxiety will decrease Outcome: Progressing   Problem: Elimination: Goal: Will not experience complications related to bowel motility Outcome: Progressing Goal: Will not experience complications related to urinary retention Outcome: Progressing   Problem: Pain  Managment: Goal: General experience of comfort will improve Outcome: Progressing   Problem: Safety: Goal: Ability to remain free from injury will improve Outcome: Progressing   Problem: Skin Integrity: Goal: Risk for impaired skin integrity will decrease Outcome: Progressing

## 2019-06-09 NOTE — Progress Notes (Signed)
Patient back, wife update about procedure

## 2019-06-10 ENCOUNTER — Inpatient Hospital Stay (HOSPITAL_COMMUNITY): Payer: Medicare Other

## 2019-06-10 DIAGNOSIS — Z01810 Encounter for preprocedural cardiovascular examination: Secondary | ICD-10-CM

## 2019-06-10 LAB — BLOOD GAS, ARTERIAL
Acid-Base Excess: 4.3 mmol/L — ABNORMAL HIGH (ref 0.0–2.0)
Acid-Base Excess: 4.2 mmol/L — ABNORMAL HIGH (ref 0.0–2.0)
Bicarbonate: 33.1 mmol/L — ABNORMAL HIGH (ref 20.0–28.0)
Bicarbonate: 31.6 mmol/L — ABNORMAL HIGH (ref 20.0–28.0)
Delivery systems: POSITIVE
Drawn by: 44135
Drawn by: 441371
Expiratory PAP: 6
FIO2: 100
FIO2: 60
Inspiratory PAP: 12
O2 Saturation: 84.2 %
O2 Saturation: 90.8 %
Patient temperature: 98.6
Patient temperature: 97.5
RATE: 12 resp/min
pCO2 arterial: 103 mmHg (ref 32.0–48.0)
pCO2 arterial: 80.5 mmHg (ref 32.0–48.0)
pH, Arterial: 7.132 — CL (ref 7.350–7.450)
pH, Arterial: 7.214 — ABNORMAL LOW (ref 7.350–7.450)
pO2, Arterial: 57.8 mmHg — ABNORMAL LOW (ref 83.0–108.0)
pO2, Arterial: 62.8 mmHg — ABNORMAL LOW (ref 83.0–108.0)

## 2019-06-10 LAB — BASIC METABOLIC PANEL
Anion gap: 16 — ABNORMAL HIGH (ref 5–15)
BUN: 92 mg/dL — ABNORMAL HIGH (ref 8–23)
CO2: 26 mmol/L (ref 22–32)
Calcium: 8.6 mg/dL — ABNORMAL LOW (ref 8.9–10.3)
Chloride: 100 mmol/L (ref 98–111)
Creatinine, Ser: 4.54 mg/dL — ABNORMAL HIGH (ref 0.61–1.24)
GFR calc Af Amer: 13 mL/min — ABNORMAL LOW (ref >60)
GFR calc non Af Amer: 11 mL/min — ABNORMAL LOW (ref >60)
Glucose, Bld: 109 mg/dL — ABNORMAL HIGH (ref 70–99)
Potassium: 4.2 mmol/L (ref 3.5–5.1)
Sodium: 142 mmol/L (ref 135–145)

## 2019-06-10 LAB — BODY FLUID CELL COUNT WITH DIFFERENTIAL
Lymphs, Fluid: 11 %
Monocyte-Macrophage-Serous Fluid: 27 % — ABNORMAL LOW (ref 50–90)
Neutrophil Count, Fluid: 62 % — ABNORMAL HIGH (ref 0–25)
Total Nucleated Cell Count, Fluid: 269 cu mm (ref 0–1000)

## 2019-06-10 LAB — LACTATE DEHYDROGENASE, PLEURAL OR PERITONEAL FLUID: LD, Fluid: 145 U/L — ABNORMAL HIGH (ref 3–23)

## 2019-06-10 LAB — PROTEIN, PLEURAL OR PERITONEAL FLUID: Total protein, fluid: 3.1 g/dL

## 2019-06-10 LAB — GRAM STAIN

## 2019-06-10 LAB — CBC
HCT: 33.6 % — ABNORMAL LOW (ref 39.0–52.0)
Hemoglobin: 9.5 g/dL — ABNORMAL LOW (ref 13.0–17.0)
MCH: 31.9 pg (ref 26.0–34.0)
MCHC: 28.3 g/dL — ABNORMAL LOW (ref 30.0–36.0)
MCV: 112.8 fL — ABNORMAL HIGH (ref 80.0–100.0)
Platelets: 190 10*3/uL (ref 150–400)
RBC: 2.98 MIL/uL — ABNORMAL LOW (ref 4.22–5.81)
RDW: 18.5 % — ABNORMAL HIGH (ref 11.5–15.5)
WBC: 10.2 10*3/uL (ref 4.0–10.5)
nRBC: 0.3 % — ABNORMAL HIGH (ref 0.0–0.2)

## 2019-06-10 LAB — PARATHYROID HORMONE, INTACT (NO CA): PTH: 15 pg/mL (ref 15–65)

## 2019-06-10 LAB — GLUCOSE, PLEURAL OR PERITONEAL FLUID: Glucose, Fluid: 128 mg/dL

## 2019-06-10 LAB — MAGNESIUM: Magnesium: 2.1 mg/dL (ref 1.7–2.4)

## 2019-06-10 LAB — HEPATITIS B SURFACE ANTIGEN: Hepatitis B Surface Ag: NEGATIVE

## 2019-06-10 LAB — SARS CORONAVIRUS 2 BY RT PCR (HOSPITAL ORDER, PERFORMED IN ~~LOC~~ HOSPITAL LAB): SARS Coronavirus 2: NEGATIVE

## 2019-06-10 LAB — PHOSPHORUS: Phosphorus: 7.6 mg/dL — ABNORMAL HIGH (ref 2.5–4.6)

## 2019-06-10 MED ORDER — FUROSEMIDE 10 MG/ML IJ SOLN
80.0000 mg | Freq: Two times a day (BID) | INTRAMUSCULAR | Status: DC
  Administered 2019-06-10 – 2019-06-12 (×5): 80 mg via INTRAVENOUS
  Filled 2019-06-10 (×5): qty 8

## 2019-06-10 MED ORDER — LIDOCAINE HCL (PF) 1 % IJ SOLN
INTRAMUSCULAR | Status: AC
  Filled 2019-06-10: qty 30

## 2019-06-10 NOTE — Progress Notes (Signed)
RT placed patient on BIPAP. FIO2 100% to maintain Spo2 92%. Patient is tolerating BIPAP settings well at this time. RT will continue to monitor.

## 2019-06-10 NOTE — Progress Notes (Signed)
Notified wife of change in status and new room number.

## 2019-06-10 NOTE — Progress Notes (Signed)
Pt transferred from [facility]  With hypoxia requiring BIPAP  16 /8 and 80% O2 bedside report received HD dressing changed d/t boipatch saturation.

## 2019-06-10 NOTE — Progress Notes (Signed)
Patient taken off Bipap and placed on a [place] HFNC at 6 L, sats 100%. Patient tolerating well at this time. RN notified.

## 2019-06-10 NOTE — Progress Notes (Signed)
Stopped by this am to discuss plan for left arm AVF vs graft tomorrow with vascular surgery.  Ultimately patient hypoxic, on bipap, right pleural effusion on CXR with whiteout, being transferred to higher level care.  Still needs vein mapping which is pending.  Will re-evaluate tomorrow am if stable for OR.  IR placed tunneled dialysis catheter yesterday so currently has access and medicine reports plan for dialysis today if able.  Marty Heck, MD [facility] Office: [phone] Pager: [place]

## 2019-06-10 NOTE — Progress Notes (Signed)
[facility]    NEPHROLOGY PROGRESS NOTE  SUBJECTIVE: Patient seen and examined.  Patient is lethargic and on BiPAP.  Weight is downtrending.  Had a tunneled catheter placed yesterday and underwent his first hemodialysis treatment.  He had 1.5 L ultrafiltered.  Overnight, he became more short of breath.  Thoracentesis is planned for this morning.  OBJECTIVE:  Vitals:   06/10/19 0754 06/10/19 0907  BP: 107/60 (!) 105/54  Pulse: 77 88  Resp: 16 (!) 23  Temp:    SpO2: 99% 95%    Intake/Output Summary (Last 24 hours) at 06/10/2019 1054 Last data filed at 06/10/2019 0000 Gross per 24 hour  Intake 240 ml  Output 800 ml  Net -560 ml      General:  AAOx3 NAD HEENT: MMM [place] AT anicteric sclera Neck:  No JVD, no adenopathy CV:  Heart RRR  Lungs: Decreased breath sounds on the right, left basilar crackles Abd:  abd SNT/ND with normal BS GU:  Bladder non-palpable Extremities: +1 bilateral lower extremity edema. Skin:  No skin rash  MEDICATIONS:  . allopurinol  100 mg Oral Daily  . amiodarone  200 mg Oral Daily  . budesonide  0.25 mg Nebulization BID  . Chlorhexidine Gluconate Cloth  6 each Topical Q0600  . darbepoetin (ARANESP) injection - DIALYSIS  150 mcg Intravenous Q Sat-HD  . furosemide  80 mg Intravenous Q12H  . guaiFENesin  600 mg Oral BID  . ipratropium-albuterol  3 mL Nebulization BID  . multivitamin  1 tablet Oral QHS  . sevelamer carbonate  800 mg Oral TID WC       LABS:   CBC Latest Ref Rng & Units 06/08/2019 06/07/2019 05/31/2019  WBC 4.0 - 10.5 K/uL 8.7 11.6(H) 11.7(H)  Hemoglobin 13.0 - 17.0 g/dL 8.6(L) 8.6(L) 9.3(L)  Hematocrit 39.0 - 52.0 % 29.0(L) 28.7(L) 31.8(L)  Platelets 150 - 400 K/uL 231 248 262    CMP Latest Ref Rng & Units 06/08/2019 06/07/2019 06/06/2019  Glucose 70 - 99 mg/dL 104(H) 108(H) 146(H)  BUN 8 - 23 mg/dL 131(H) 130(H) 132(H)  Creatinine 0.61 - 1.24 mg/dL 5.56(H) 5.64(H) 5.81(H)  Sodium 135 - 145 mmol/L 142 142 139   Potassium 3.5 - 5.1 mmol/L 3.7 4.3 4.4  Chloride 98 - 111 mmol/L 100 101 99  CO2 22 - 32 mmol/L 29 29 25   Calcium 8.9 - 10.3 mg/dL 8.7(L) 8.9 8.9  Total Protein 6.5 - 8.1 g/dL - - -  Total Bilirubin 0.3 - 1.2 mg/dL - - -  Alkaline Phos 38 - 126 U/L - - -  AST 15 - 41 U/L - - -  ALT 0 - 44 U/L - - -    Lab Results  Component Value Date   PTH 67 (H) 03/20/2019   CALCIUM 8.7 (L) 06/08/2019   CAION 1.24 05/23/2019   PHOS 7.1 (H) 06/08/2019       Component Value Date/Time   COLORURINE YELLOW 06/03/2019 1022   APPEARANCEUR HAZY (A) 06/03/2019 1022   LABSPEC 1.014 06/03/2019 1022   PHURINE 5.0 06/03/2019 1022   GLUCOSEU NEGATIVE 06/03/2019 1022   HGBUR NEGATIVE 06/03/2019 1022   BILIRUBINUR NEGATIVE 06/03/2019 1022   KETONESUR NEGATIVE 06/03/2019 1022   PROTEINUR 30 (A) 06/03/2019 1022   UROBILINOGEN 1.0 05/08/2013 1738   NITRITE NEGATIVE 06/03/2019 1022   LEUKOCYTESUR NEGATIVE 06/03/2019 1022      Component Value Date/Time   PHART 7.214 (L) 06/10/2019 0915   PCO2ART 80.5 ([place]) 06/10/2019 0915  PO2ART 62.8 (L) 06/10/2019 0915   HCO3 31.6 (H) 06/10/2019 0915   TCO2 34 (H) 05/23/2019 0341   ACIDBASEDEF 3.0 (H) 03/05/2019 0317   O2SAT 90.8 06/10/2019 0915       Component Value Date/Time   IRON 35 (L) 06/02/2019 0411   TIBC 214 (L) 06/02/2019 0411   FERRITIN 826 (H) 06/02/2019 0411   IRONPCTSAT 16 (L) 06/02/2019 0411       ASSESSMENT/PLAN:    85 year old male patient with past medical history significant for COPD, chronic respiratory failure on oxygen, atrial fibrillation status post cardioversion, hypertension, systolic congestive heart failure with an ejection fraction of 30%, and chronic kidney disease who presented with progressive hypoxia requiring BiPAP.  He was found to have a moderate right pleural effusion.  He was suspected to have healthcare associated pneumonia.  He has been requiring significant amounts of oxygen supplementation.  He was noted to have worsening  renal function.  1.  End-stage renal disease.  Status post permacath placement.  Will ask vascular surgery to see for permanent access.  For second dialysis treatment tomorrow.  2.  Acute on chronic hypercarbic respiratory failure.  Oxygenation is stable.  Continue oxygen supplementation.  Is currently on BiPAP.  Plan for a right-sided thoracentesis for a large right pleural effusion, which should improve his ventilation.  If it does not, we will plan for dialysis later today.  3.  Chronic heart failure with reduced ejection fraction.  Will continue diuresis.  Should be better managed on dialysis.  4.  Anemia of chronic kidney disease.  Continue to darbopoietin.  5.  Metabolic bone disease.  Check PTH.  Continue phosphate binder.  6.  Large right pleural effusion.  Hopefully will improve with thoracentesis today.  [place], DO, [place]

## 2019-06-10 NOTE — Progress Notes (Signed)
PROGRESS NOTE    David Barton  MWU:[id number] DOB: 1933/11/13 DOA: 06/07/2019 PCP: Maury Dus, MD   Brief Narrative:  85 year old with past medical history of COPD/emphysema, chronic hypoxia on home 2L O2, atrial fibrillation, systolic CHF ejection fraction 30%, essential hypertension, CKD stage V, colon cancer presented to the hospital to start hemodialysis.  Recently for a moderate right-sided pleural effusion requiring IV antibiotic treatment and thoracentesis.  Initially required significant amount of oxygen during the hospitalization.  Permacath placement and then fistula placement.  IR and vascular following.  On 6/20 overnight patient developed acute shortness of breath and hypercarbia requiring BiPAP.  X-ray showed complete whiteout on the right side.   Assessment & Plan:   Principal Problem:   ESRD (end stage renal disease) ([place]) Active Problems:   COPD with acute exacerbation ([place])   Essential hypertension   Chronic respiratory failure with hypoxia ([name])   Leukocytosis   Macrocytic anemia  Acute respiratory failure-hypoxic and hypercarbic with large right-sided pleural effusion Acute metabolic encephalopathy secondary to CO2 narcosis - Patient is currently on BiPAP, easily arousable but very drowsy.  Overnight ABG showed pH of 7.1 with PCO2 greater than 100.  This morning some adjustments were made and showed improvement in pH to 7.2 with PCO2 down to 80. -Case discussed with interventional radiology, plan on performing ultrasound-guided thoracentesis today.  Hopeful this will improve his condition much more.  We will continue to monitor him - If possible plan for dialysis later today otherwise most likely tomorrow.  Discussed with nephrology.  Worsening Renal Function, New Onset ESRD on HD with electrolyte Abnormality -Tunnel catheter placed 6/20.  Vascular team following, plans for vein mapping and fistula placement once medically stable. -Hep B and Hep C is negative.   -lasix 160mg  po bid, unable to take p.o. at this time.  80 mg IV given.  COPD with chronic hypoxia 2L O2 but currently on BiPAP -bronchodilators prn.   P Afib, on Eliquis  -on Amiodarone, AC on Hold due to planned procedures.  F/u outptn Dr Gwenlyn Found  Anemia of Chronic Disease  -On Darbopoeitin, Monitor Renal Function. Check B12 and TSH within normal limits, folate pending.  Chronic Systolic CHF, ef 72% -needs to follow up outpatient with Cardiology. Need to optimize his medicaitons. Up until now not a candidate for ACE/ARB but if he is going to be permanent HD, then we can reconsider.   Essential HTN - Only on BB at home, currently on hold.   -He yet to be put on BiPAP and was taken to our higher level of care DVT prophylaxis: SCDs for now, anticoagulation on hold due to planned procedure Code Status: DNR Family Communication: Patient's wife [name] updated by me Disposition Plan: Patient is currently on BiPAP, maintain stepdown unit.  Consultants:   Nephro   IR  Procedures:   Tunnelled cath placement 6/20  Antimicrobials:   None   Subjective: Overnight patient became extremely short of breath and went into respiratory failure requiring BiPAP.  Had respiratory acidosis.  X-ray suggested complete right-sided whiteout.  Patient seen and examined this morning he was arousable to sternal rub but unable to stay up.  Overall he appeared that he was protecting his airway while being on BiPAP.  Review of Systems Otherwise negative except as per HPI, including: Difficult to obtain  Objective: Vitals:   06/10/19 0531 06/10/19 0751 06/10/19 0754 06/10/19 0907  BP: 107/70 107/60 107/60 (!) 105/54  Pulse: 94 71 77 88  Resp: 15 15  16 (!) 23  Temp: (!) 97.5 F (36.4 C)     TempSrc: Oral     SpO2: 97% 98% 99% 95%  Weight: 101.2 kg       Intake/Output Summary (Last 24 hours) at 06/10/2019 1107 Last data filed at 06/10/2019 0000 Gross per 24 hour  Intake 240 ml  Output 800 ml   Net -560 ml   Filed Weights   06/09/19 1902 06/09/19 2111 06/10/19 0531  Weight: 101.6 kg 100.7 kg 101.2 kg    Examination:  Constitutional: Somnolent, on BiPAP Respiratory: No breath sounds on the right side.  Diminished breath sounds at the left lower basis Cardiovascular: Regular rate and rhythm, no murmurs / rubs / gallops. No extremity edema. 2+ pedal pulses. No carotid bruits.  Abdomen: no tenderness, no masses palpated. No hepatosplenomegaly. Bowel sounds positive.  Musculoskeletal: no clubbing / cyanosis. No joint deformity upper and lower extremities. Good ROM, no contractures. Normal muscle tone.  Skin: no rashes, lesions, ulcers. No induration Neurologic: Very drowsy but withdrawing all the extremities to pain and sternal rub Psychiatric: Difficult to assess  Right IJ tunneled catheter noted.    Data Reviewed:   CBC: Recent Labs  Lab 06/07/19 1531 06/08/19 0650  WBC 11.6* 8.7  HGB 8.6* 8.6*  HCT 28.7* 29.0*  MCV 107.5* 107.8*  PLT 248 937   Basic Metabolic Panel: Recent Labs  Lab 06/04/19 0616 06/04/19 1222 06/06/19 1301 06/07/19 0746 06/08/19 0650  NA 139 138 139 142 142  K 5.4* 5.9* 4.4 4.3 3.7  CL 100 98 99 101 100  CO2 25 27 25 29 29   GLUCOSE 106* 126* 146* 108* 104*  BUN 117* 120* 132* 130* 131*  CREATININE 5.61* 5.79* 5.81* 5.64* 5.56*  CALCIUM 8.8* 8.9 8.9 8.9 8.7*  PHOS  --  7.0* 6.9* 7.1* 7.1*   GFR: Estimated Creatinine Clearance: 12 mL/min (A) (by C-G formula based on SCr of 5.56 mg/dL (H)). Liver Function Tests: Recent Labs  Lab 06/04/19 1222 06/06/19 1301 06/07/19 0746 06/08/19 0650  ALBUMIN 2.6* 2.6* 2.5* 2.5*   No results for input(s): LIPASE, AMYLASE in the last 168 hours. No results for input(s): AMMONIA in the last 168 hours. Coagulation Profile: Recent Labs  Lab 06/07/19 1531 06/08/19 1440 06/09/19 0844  INR 2.0* 1.6* 1.5*   Cardiac Enzymes: No results for input(s): CKTOTAL, CKMB, CKMBINDEX, TROPONINI in the  last 168 hours. BNP (last 3 results) Recent Labs    05/04/19 1137  PROBNP 443.0*   HbA1C: No results for input(s): HGBA1C in the last 72 hours. CBG: Recent Labs  Lab 06/04/19 0621 06/04/19 2243 06/05/19 0743 06/08/19 1151  GLUCAP 109* 132* 111* 96   Lipid Profile: No results for input(s): CHOL, HDL, LDLCALC, TRIG, CHOLHDL, LDLDIRECT in the last 72 hours. Thyroid Function Tests: Recent Labs    06/09/19 1329  TSH 3.913   Anemia Panel: Recent Labs    06/08/19 0650 06/09/19 1329  VITAMINB12 1,029* 1,085*   Sepsis Labs: No results for input(s): PROCALCITON, LATICACIDVEN in the last 168 hours.  Recent Results (from the past 240 hour(s))  SARS Coronavirus 2 (CEPHEID - Performed in [place] hospital lab), Hosp Order     Status: None   Collection Time: 06/01/19  4:32 PM   Specimen: Nasopharyngeal Swab  Result Value Ref Range Status   SARS Coronavirus 2 NEGATIVE NEGATIVE Final    Comment: (NOTE) If result is NEGATIVE SARS-CoV-2 target nucleic acids are NOT DETECTED. The SARS-CoV-2 RNA is generally detectable  in upper and lower  respiratory specimens during the acute phase of infection. The lowest  concentration of SARS-CoV-2 viral copies this assay can detect is 250  copies / mL. A negative result does not preclude SARS-CoV-2 infection  and should not be used as the sole basis for treatment or other  patient management decisions.  A negative result may occur with  improper specimen collection / handling, submission of specimen other  than nasopharyngeal swab, presence of viral mutation(s) within the  areas targeted by this assay, and inadequate number of viral copies  (<250 copies / mL). A negative result must be combined with clinical  observations, patient history, and epidemiological information. If result is POSITIVE SARS-CoV-2 target nucleic acids are DETECTED. The SARS-CoV-2 RNA is generally detectable in upper and lower  respiratory specimens dur ing the  acute phase of infection.  Positive  results are indicative of active infection with SARS-CoV-2.  Clinical  correlation with patient history and other diagnostic information is  necessary to determine patient infection status.  Positive results do  not rule out bacterial infection or co-infection with other viruses. If result is PRESUMPTIVE POSTIVE SARS-CoV-2 nucleic acids MAY BE PRESENT.   A presumptive positive result was obtained on the submitted specimen  and confirmed on repeat testing.  While 2019 novel coronavirus  (SARS-CoV-2) nucleic acids may be present in the submitted sample  additional confirmatory testing may be necessary for epidemiological  and / or clinical management purposes  to differentiate between  SARS-CoV-2 and other Sarbecovirus currently known to infect humans.  If clinically indicated additional testing with an alternate test  methodology [phone]) is advised. The SARS-CoV-2 RNA is generally  detectable in upper and lower respiratory sp ecimens during the acute  phase of infection. The expected result is Negative. Fact Sheet for Patients:  StrictlyIdeas.no Fact Sheet for Healthcare Providers: BankingDealers.co.za This test is not yet approved or cleared by the [place] FDA and has been authorized for detection and/or diagnosis of SARS-CoV-2 by FDA under an Emergency Use Authorization (EUA).  This EUA will remain in effect (meaning this test can be used) for the duration of the COVID-19 declaration under Section 564(b)(1) of the Act, 21 U.S.C. section 360bbb-3(b)(1), unless the authorization is terminated or revoked sooner. Performed at [facility], [place] [address], [place], [place] [address]   Culture, body fluid-bottle     Status: None   Collection Time: 06/02/19 10:46 AM   Specimen: Pleura  Result Value Ref Range Status   Specimen Description PLEURAL RIGHT  Final   Special Requests NONE  Final    Culture   Final    NO GROWTH 5 DAYS Performed at [facility] [address], [place], [place] [address]    Report Status 06/07/2019 FINAL  Final  Gram stain     Status: None   Collection Time: 06/02/19 10:46 AM   Specimen: Pleura  Result Value Ref Range Status   Specimen Description PLEURAL RIGHT  Final   Special Requests NONE  Final   Gram Stain   Final    RARE WBC PRESENT, PREDOMINANTLY PMN NO ORGANISMS SEEN Performed at [facility], [place] [address], [place], [place] [address]    Report Status 06/03/2019 FINAL  Final  Surgical pcr screen     Status: None   Collection Time: 06/09/19  9:49 AM   Specimen: Nasal Mucosa; Nasal Swab  Result Value Ref Range Status   MRSA, PCR NEGATIVE NEGATIVE Final  Staphylococcus aureus NEGATIVE NEGATIVE Final    Comment: (NOTE) The Xpert SA Assay (FDA approved for NASAL specimens in patients 58 years of age and older), is one component of a comprehensive surveillance program. It is not intended to diagnose infection nor to guide or monitor treatment. Performed at [facility], [place] [address], [place], [place] [address]   SARS Coronavirus 2 (CEPHEID - Performed in [place] hospital lab), Hosp Order     Status: None   Collection Time: 06/10/19  9:35 AM   Specimen: Nasopharyngeal Swab  Result Value Ref Range Status   SARS Coronavirus 2 NEGATIVE NEGATIVE Final    Comment: (NOTE) If result is NEGATIVE SARS-CoV-2 target nucleic acids are NOT DETECTED. The SARS-CoV-2 RNA is generally detectable in upper and lower  respiratory specimens during the acute phase of infection. The lowest  concentration of SARS-CoV-2 viral copies this assay can detect is 250  copies / mL. A negative result does not preclude SARS-CoV-2 infection  and should not be used as the sole basis for treatment or other  patient management decisions.  A negative result may occur with  improper specimen collection / handling, submission of specimen other   than nasopharyngeal swab, presence of viral mutation(s) within the  areas targeted by this assay, and inadequate number of viral copies  (<250 copies / mL). A negative result must be combined with clinical  observations, patient history, and epidemiological information. If result is POSITIVE SARS-CoV-2 target nucleic acids are DETECTED. The SARS-CoV-2 RNA is generally detectable in upper and lower  respiratory specimens dur ing the acute phase of infection.  Positive  results are indicative of active infection with SARS-CoV-2.  Clinical  correlation with patient history and other diagnostic information is  necessary to determine patient infection status.  Positive results do  not rule out bacterial infection or co-infection with other viruses. If result is PRESUMPTIVE POSTIVE SARS-CoV-2 nucleic acids MAY BE PRESENT.   A presumptive positive result was obtained on the submitted specimen  and confirmed on repeat testing.  While 2019 novel coronavirus  (SARS-CoV-2) nucleic acids may be present in the submitted sample  additional confirmatory testing may be necessary for epidemiological  and / or clinical management purposes  to differentiate between  SARS-CoV-2 and other Sarbecovirus currently known to infect humans.  If clinically indicated additional testing with an alternate test  methodology [phone]) is advised. The SARS-CoV-2 RNA is generally  detectable in upper and lower respiratory sp ecimens during the acute  phase of infection. The expected result is Negative. Fact Sheet for Patients:  StrictlyIdeas.no Fact Sheet for Healthcare Providers: BankingDealers.co.za This test is not yet approved or cleared by the [place] FDA and has been authorized for detection and/or diagnosis of SARS-CoV-2 by FDA under an Emergency Use Authorization (EUA).  This EUA will remain in effect (meaning this test can be used) for the duration of  the COVID-19 declaration under Section 564(b)(1) of the Act, 21 U.S.C. section 360bbb-3(b)(1), unless the authorization is terminated or revoked sooner. Performed at [facility], [place] [address] [place], [place] [address]          Radiology Studies: Ir Cyndy Freeze Guide Cv Line Right  Result Date: 06/09/2019 INDICATION: 85 year old with end-stage renal disease. Patient needs a dialysis catheter for hemodialysis. EXAM: FLUOROSCOPIC AND ULTRASOUND GUIDED PLACEMENT OF A TUNNELED DIALYSIS CATHETER Physician: Stephan Minister. Anselm Pancoast, MD MEDICATIONS: Ancef 2 g; The antibiotic was administered within an appropriate time interval prior to skin  puncture. ANESTHESIA/SEDATION: Versed 0.5 mg IV; Fentanyl 25 mcg IV; Moderate Sedation Time:  15 minutes The patient was continuously monitored during the procedure by the interventional radiology nurse under my direct supervision. FLUOROSCOPY TIME:  Fluoroscopy Time: 48 seconds, 8 mGy COMPLICATIONS: None immediate. PROCEDURE: Informed consent was obtained for placement of a tunneled dialysis catheter. The patient was placed supine on the interventional table. Ultrasound confirmed a patent right internal jugular vein. Ultrasound images were obtained for documentation. The right side of the neck and chest was prepped and draped in a sterile fashion. The right neck was anesthetized with 1% lidocaine. Maximal barrier sterile technique was utilized including caps, mask, sterile gowns, sterile gloves, sterile drape, hand hygiene and skin antiseptic. A small incision was made with #11 blade scalpel. A 21 gauge needle directed into the right internal jugular vein with ultrasound guidance. A micropuncture dilator set was placed. A 23 cm tip to cuff Palindrome catheter was selected. The skin below the right clavicle was anesthetized and a small incision was made with an #11 blade scalpel. A subcutaneous tunnel was formed to the vein dermatotomy site. The catheter was brought through the  tunnel. The vein dermatotomy site was dilated to accommodate a peel-away sheath. The catheter was placed through the peel-away sheath and directed into the central venous structures. The tip of the catheter was placed at the superior cavoatrial junction with fluoroscopy. Fluoroscopic images were obtained for documentation. Both lumens were found to aspirate and flush well. The proper amount of heparin was flushed in both lumens. The vein dermatotomy site was closed using a single layer of absorbable suture and Dermabond. Gel-Foam was placed in subcutaneous tract. The catheter was secured to the skin using Prolene suture. IMPRESSION: Successful placement of a right jugular tunneled dialysis catheter using ultrasound and fluoroscopic guidance. Electronically Signed   By: Markus Daft M.D.   On: 06/09/2019 13:29   Ir US Guide Vasc Access Right  Result Date: 06/09/2019 INDICATION: 85 year old with end-stage renal disease. Patient needs a dialysis catheter for hemodialysis. EXAM: FLUOROSCOPIC AND ULTRASOUND GUIDED PLACEMENT OF A TUNNELED DIALYSIS CATHETER Physician: Stephan Minister. Anselm Pancoast, MD MEDICATIONS: Ancef 2 g; The antibiotic was administered within an appropriate time interval prior to skin puncture. ANESTHESIA/SEDATION: Versed 0.5 mg IV; Fentanyl 25 mcg IV; Moderate Sedation Time:  15 minutes The patient was continuously monitored during the procedure by the interventional radiology nurse under my direct supervision. FLUOROSCOPY TIME:  Fluoroscopy Time: 48 seconds, 8 mGy COMPLICATIONS: None immediate. PROCEDURE: Informed consent was obtained for placement of a tunneled dialysis catheter. The patient was placed supine on the interventional table. Ultrasound confirmed a patent right internal jugular vein. Ultrasound images were obtained for documentation. The right side of the neck and chest was prepped and draped in a sterile fashion. The right neck was anesthetized with 1% lidocaine. Maximal barrier sterile technique was  utilized including caps, mask, sterile gowns, sterile gloves, sterile drape, hand hygiene and skin antiseptic. A small incision was made with #11 blade scalpel. A 21 gauge needle directed into the right internal jugular vein with ultrasound guidance. A micropuncture dilator set was placed. A 23 cm tip to cuff Palindrome catheter was selected. The skin below the right clavicle was anesthetized and a small incision was made with an #11 blade scalpel. A subcutaneous tunnel was formed to the vein dermatotomy site. The catheter was brought through the tunnel. The vein dermatotomy site was dilated to accommodate a peel-away sheath. The catheter was placed through the peel-away  sheath and directed into the central venous structures. The tip of the catheter was placed at the superior cavoatrial junction with fluoroscopy. Fluoroscopic images were obtained for documentation. Both lumens were found to aspirate and flush well. The proper amount of heparin was flushed in both lumens. The vein dermatotomy site was closed using a single layer of absorbable suture and Dermabond. Gel-Foam was placed in subcutaneous tract. The catheter was secured to the skin using Prolene suture. IMPRESSION: Successful placement of a right jugular tunneled dialysis catheter using ultrasound and fluoroscopic guidance. Electronically Signed   By: Markus Daft M.D.   On: 06/09/2019 13:29   Dg Chest Port 1 View  Result Date: 06/10/2019 CLINICAL DATA:  Shortness of breath today. EXAM: PORTABLE CHEST 1 VIEW COMPARISON:  PA and lateral chest 06/04/2019. FINDINGS: There is complete whiteout of the right chest consistent with pleural effusion and airspace disease. Small to moderate left pleural effusion and basilar atelectasis are noted. Marked cardiomegaly. Atherosclerosis is noted. Right IJ approach dialysis catheter tip projects at the superior cavoatrial junction. No pneumothorax. IMPRESSION: Complete whiteout of the right chest compatible with pleural  effusion and airspace disease. The patient had only small pleural effusions on the prior exam. Small left pleural effusion and basilar atelectasis. Cardiomegaly. Electronically Signed   By: Inge Rise M.D.   On: 06/10/2019 03:33        Scheduled Meds:  lidocaine (PF)       allopurinol  100 mg Oral Daily   amiodarone  200 mg Oral Daily   budesonide  0.25 mg Nebulization BID   Chlorhexidine Gluconate Cloth  6 each Topical Q0600   darbepoetin (ARANESP) injection - DIALYSIS  150 mcg Intravenous Q Sat-HD   furosemide  80 mg Intravenous Q12H   guaiFENesin  600 mg Oral BID   ipratropium-albuterol  3 mL Nebulization BID   multivitamin  1 tablet Oral QHS   sevelamer carbonate  800 mg Oral TID WC   Continuous Infusions:  [START ON 06/11/2019]  ceFAZolin (ANCEF) IV       LOS: 3 days   Time spent=45 mins    David Kirsch Arsenio Loader, MD [facility]  If 7PM-7AM, please contact night-coverage [email or web address] 06/10/2019, 11:07 AM

## 2019-06-10 NOTE — Progress Notes (Addendum)
Pt's O2 sat in the 60's upon return from dialysis. Respiratory notified and titrated high flow [place] up to 10L with 02 sat increasing to 80%. Applied venturi mask at this time with 15L. O2 sat now 90%. Lungs congested. Cough congested but not productive.

## 2019-06-10 NOTE — Progress Notes (Signed)
Pt continues on nonrebreather with sats 88-92%. Lungs still congested. Lethargic but easily arousable and oriented x3. Notified MD on call.

## 2019-06-10 NOTE — Progress Notes (Signed)
VASCULAR LAB PRELIMINARY  PRELIMINARY  PRELIMINARY  PRELIMINARY  Vein mapping completed.    Preliminary report:  See CV proc for preliminary results.   Kingdom Vanzanten, RVT 06/10/2019, 1:58 PM

## 2019-06-10 NOTE — Progress Notes (Addendum)
Pt's o2 sat decreased to 70's on venturi mask. Respiratory notified and they placed pt on non rebreather with sats increasing to 90s. Pt is lethargice but easily arousable and oriented x3.

## 2019-06-10 NOTE — Progress Notes (Signed)
Received results from ABGs and notified MD.

## 2019-06-10 NOTE — Progress Notes (Signed)
Critical ABG values give to a. Reesa Chew, MD.

## 2019-06-10 NOTE — Procedures (Signed)
PROCEDURE SUMMARY:  Successful image-guided right thoracentesis. Yielded 2.3 liters of dark red fluid. Patient tolerated procedure well. No immediate complications. EBL = 5 mL.  Specimen was sent for labs. CXR ordered.  Earley Abide PA-C 06/10/2019 12:28 PM

## 2019-06-11 ENCOUNTER — Encounter (HOSPITAL_COMMUNITY): Payer: Self-pay | Admitting: General Practice

## 2019-06-11 ENCOUNTER — Inpatient Hospital Stay (HOSPITAL_COMMUNITY): Payer: Medicare Other | Admitting: Certified Registered Nurse Anesthetist

## 2019-06-11 ENCOUNTER — Inpatient Hospital Stay (HOSPITAL_COMMUNITY): Payer: Medicare Other

## 2019-06-11 ENCOUNTER — Other Ambulatory Visit: Payer: Self-pay

## 2019-06-11 DIAGNOSIS — J90 Pleural effusion, not elsewhere classified: Secondary | ICD-10-CM

## 2019-06-11 DIAGNOSIS — Z515 Encounter for palliative care: Secondary | ICD-10-CM

## 2019-06-11 DIAGNOSIS — J9622 Acute and chronic respiratory failure with hypercapnia: Secondary | ICD-10-CM

## 2019-06-11 DIAGNOSIS — J9621 Acute and chronic respiratory failure with hypoxia: Secondary | ICD-10-CM

## 2019-06-11 DIAGNOSIS — I482 Chronic atrial fibrillation, unspecified: Secondary | ICD-10-CM

## 2019-06-11 LAB — FOLATE RBC
Folate, Hemolysate: >620 ng/mL
Folate, Hemolysate: >620 ng/mL
Folate, RBC: >2255 ng/mL (ref >498)
Folate, RBC: >2060 ng/mL (ref >498)
Hematocrit: 27.5 % — ABNORMAL LOW (ref 37.5–51.0)
Hematocrit: 30.1 % — ABNORMAL LOW (ref 37.5–51.0)

## 2019-06-11 LAB — GLUCOSE, CAPILLARY
Glucose-Capillary: 117 mg/dL — ABNORMAL HIGH (ref 70–99)
Glucose-Capillary: 177 mg/dL — ABNORMAL HIGH (ref 70–99)
Glucose-Capillary: 122 mg/dL — ABNORMAL HIGH (ref 70–99)

## 2019-06-11 MED ORDER — ALTEPLASE 2 MG IJ SOLR: 2.0000 mg | Freq: Once | INTRAMUSCULAR | Status: DC | PRN

## 2019-06-11 MED ORDER — HEPARIN (PORCINE) 25000 UT/250ML-% IV SOLN
1500.0000 [IU]/h | INTRAVENOUS | Status: DC
  Administered 2019-06-11: 1100 [IU]/h via INTRAVENOUS
  Administered 2019-06-13 – 2019-06-14 (×2): 1500 [IU]/h via INTRAVENOUS
  Filled 2019-06-11 (×4): qty 250

## 2019-06-11 MED ORDER — ONDANSETRON HCL 4 MG/2ML IJ SOLN
INTRAMUSCULAR | Status: AC
  Filled 2019-06-11: qty 2

## 2019-06-11 MED ORDER — SUCCINYLCHOLINE CHLORIDE 200 MG/10ML IV SOSY
PREFILLED_SYRINGE | INTRAVENOUS | Status: AC
  Filled 2019-06-11: qty 10

## 2019-06-11 MED ORDER — SODIUM CHLORIDE 0.9 % IV SOLN: 100.0000 mL | INTRAVENOUS | Status: DC | PRN

## 2019-06-11 MED ORDER — LIDOCAINE 2% (20 MG/ML) 5 ML SYRINGE
INTRAMUSCULAR | Status: AC
  Filled 2019-06-11: qty 5

## 2019-06-11 MED ORDER — HEPARIN BOLUS VIA INFUSION
2000.0000 [IU] | Freq: Once | INTRAVENOUS | Status: AC
  Administered 2019-06-11: 2000 [IU] via INTRAVENOUS
  Filled 2019-06-11: qty 2000

## 2019-06-11 MED ORDER — HEPARIN SODIUM (PORCINE) 1000 UNIT/ML DIALYSIS: 1000.0000 [IU] | INTRAMUSCULAR | Status: DC | PRN

## 2019-06-11 MED ORDER — FENTANYL CITRATE (PF) 250 MCG/5ML IJ SOLN
INTRAMUSCULAR | Status: AC
  Filled 2019-06-11: qty 5

## 2019-06-11 MED ORDER — HEPARIN SODIUM (PORCINE) 1000 UNIT/ML IJ SOLN
INTRAMUSCULAR | Status: AC
  Administered 2019-06-11: 3800 [IU]
  Filled 2019-06-11: qty 4

## 2019-06-11 MED ORDER — PROPOFOL 10 MG/ML IV BOLUS
INTRAVENOUS | Status: AC
  Filled 2019-06-11: qty 20

## 2019-06-11 NOTE — Anesthesia Preprocedure Evaluation (Deleted)
Anesthesia Evaluation  Patient identified by MRN, date of birth, ID band Patient awake    Reviewed: Allergy & Precautions, NPO status , Patient's Chart, lab work & pertinent test results  Airway Mallampati: II  TM Distance: >3 FB Neck ROM: Limited    Dental no notable dental hx.    Pulmonary sleep apnea , COPD, former smoker,    Pulmonary exam normal breath sounds clear to auscultation + decreased breath sounds      Cardiovascular hypertension, +CHF  Normal cardiovascular exam Rhythm:Regular Rate:Normal  Left Ventricle: The left ventricle has mildly reduced systolic function, with an ejection fraction of 45-50%. The cavity size was normal. There is mildly increased left ventricular wall thickness. Left ventricular diastolic Doppler parameters are  indeterminate. LV endocardium difficult to see (No Definity used). EF appears to be ~45-50% with hypokinesis of the inferolateral, inferoseptal and infero-apical myocardium.   Neuro/Psych negative neurological ROS  negative psych ROS   GI/Hepatic negative GI ROS, (+)     substance abuse  alcohol use,   Endo/Other  negative endocrine ROS  Renal/GU ESRFRenal disease  negative genitourinary   Musculoskeletal negative musculoskeletal ROS (+)   Abdominal   Peds negative pediatric ROS (+)  Hematology  (+) anemia ,   Anesthesia Other Findings   Reproductive/Obstetrics negative OB ROS                             Anesthesia Physical Anesthesia Plan  ASA: III  Anesthesia Plan: General   Post-op Pain Management:    Induction: Intravenous  PONV Risk Score and Plan: 2 and Ondansetron and Treatment may vary due to age or medical condition  Airway Management Planned: LMA  Additional Equipment:   Intra-op Plan:   Post-operative Plan: Extubation in OR  Informed Consent: I have reviewed the patients History and Physical, chart, labs and discussed  the procedure including the risks, benefits and alternatives for the proposed anesthesia with the patient or authorized representative who has indicated his/her understanding and acceptance.     Dental advisory given  Plan Discussed with: CRNA and Surgeon  Anesthesia Plan Comments:         Anesthesia Quick Evaluation

## 2019-06-11 NOTE — Progress Notes (Signed)
Patient refused morning labs, will pass on to day shift RN.

## 2019-06-11 NOTE — Progress Notes (Signed)
[facility]    NEPHROLOGY PROGRESS NOTE  SUBJECTIVE: Patient seen and examined.  Is doing better from a respiratory standpoint this afternoon.  Denies any chest pain or shortness of breath.  Is frustrated with all of the procedures and does not think he really wants to go through with all of it.  We will plan for palliative care to evaluate.  Patient still wants to have left forearm AV fistula.  OBJECTIVE:  Vitals:   06/11/19 1230 06/11/19 1303  BP: (!) 112/53 (!) 104/42  Pulse: 92 79  Resp:    Temp:  97.7 F (36.5 C)  SpO2:  96%    Intake/Output Summary (Last 24 hours) at 06/11/2019 1534 Last data filed at 06/11/2019 1303 Gross per 24 hour  Intake -  Output 3000 ml  Net -3000 ml      General:  AAOx3 NAD HEENT: MMM [place] AT anicteric sclera Neck:  No JVD, no adenopathy CV:  Heart RRR  Lungs: Improved aeration on the right, left basilar crackles Abd:  abd SNT/ND with normal BS GU:  Bladder non-palpable Extremities: +1 bilateral lower extremity edema. Skin:  No skin rash  MEDICATIONS:  . allopurinol  100 mg Oral Daily  . amiodarone  200 mg Oral Daily  . budesonide  0.25 mg Nebulization BID  . Chlorhexidine Gluconate Cloth  6 each Topical Q0600  . darbepoetin (ARANESP) injection - DIALYSIS  150 mcg Intravenous Q Sat-HD  . furosemide  80 mg Intravenous Q12H  . guaiFENesin  600 mg Oral BID  . ipratropium-albuterol  3 mL Nebulization BID  . multivitamin  1 tablet Oral QHS  . sevelamer carbonate  800 mg Oral TID WC       LABS:   CBC Latest Ref Rng & Units 06/10/2019 06/09/2019 06/08/2019  WBC 4.0 - 10.5 K/uL 10.2 - 8.7  Hemoglobin 13.0 - 17.0 g/dL 9.5(L) - 8.6(L)  Hematocrit 39.0 - 52.0 % 33.6(L) 30.1(L) 29.0(L)  Platelets 150 - 400 K/uL 190 - 231    CMP Latest Ref Rng & Units 06/10/2019 06/08/2019 06/07/2019  Glucose 70 - 99 mg/dL 109(H) 104(H) 108(H)  BUN 8 - 23 mg/dL 92(H) 131(H) 130(H)  Creatinine 0.61 - 1.24 mg/dL 4.54(H) 5.56(H) 5.64(H)  Sodium 135  - 145 mmol/L 142 142 142  Potassium 3.5 - 5.1 mmol/L 4.2 3.7 4.3  Chloride 98 - 111 mmol/L 100 100 101  CO2 22 - 32 mmol/L 26 29 29   Calcium 8.9 - 10.3 mg/dL 8.6(L) 8.7(L) 8.9  Total Protein 6.5 - 8.1 g/dL - - -  Total Bilirubin 0.3 - 1.2 mg/dL - - -  Alkaline Phos 38 - 126 U/L - - -  AST 15 - 41 U/L - - -  ALT 0 - 44 U/L - - -    Lab Results  Component Value Date   PTH 15 06/09/2019   CALCIUM 8.6 (L) 06/10/2019   CAION 1.24 05/23/2019   PHOS 7.6 (H) 06/10/2019       Component Value Date/Time   COLORURINE YELLOW 06/03/2019 1022   APPEARANCEUR HAZY (A) 06/03/2019 1022   LABSPEC 1.014 06/03/2019 1022   PHURINE 5.0 06/03/2019 1022   GLUCOSEU NEGATIVE 06/03/2019 1022   HGBUR NEGATIVE 06/03/2019 1022   BILIRUBINUR NEGATIVE 06/03/2019 1022   KETONESUR NEGATIVE 06/03/2019 1022   PROTEINUR 30 (A) 06/03/2019 1022   UROBILINOGEN 1.0 05/08/2013 1738   NITRITE NEGATIVE 06/03/2019 1022   LEUKOCYTESUR NEGATIVE 06/03/2019 1022      Component Value Date/Time  PHART 7.214 (L) 06/10/2019 0915   PCO2ART 80.5 (HH) 06/10/2019 0915   PO2ART 62.8 (L) 06/10/2019 0915   HCO3 31.6 (H) 06/10/2019 0915   TCO2 34 (H) 05/23/2019 0341   ACIDBASEDEF 3.0 (H) 03/05/2019 0317   O2SAT 90.8 06/10/2019 0915       Component Value Date/Time   IRON 35 (L) 06/02/2019 0411   TIBC 214 (L) 06/02/2019 0411   FERRITIN 826 (H) 06/02/2019 0411   IRONPCTSAT 16 (L) 06/02/2019 0411       ASSESSMENT/PLAN:    85 year old male patient with past medical history significant for COPD, chronic respiratory failure on oxygen, atrial fibrillation status post cardioversion, hypertension, systolic congestive heart failure with an ejection fraction of 30%, and chronic kidney disease who presented with progressive hypoxia requiring BiPAP.  He was found to have a moderate right pleural effusion.  He was suspected to have healthcare associated pneumonia.  He has been requiring significant amounts of oxygen supplementation.  He  was noted to have worsening renal function.  1.  End-stage renal disease.  Status post permacath placement.  Will ask vascular surgery to see for permanent access.  For third dialysis on Wednesday.  We will asked palliative care to see.  2.  Acute on chronic hypercarbic respiratory failure.  Oxygenation is stable.  Continue oxygen supplementation.  Improved after dialysis today and thoracentesis yesterday.  We will continue to try to ultrafilter as much as possible.  3.  Chronic heart failure with reduced ejection fraction.  Will continue diuresis.  Should be better managed on dialysis.  4.  Anemia of chronic kidney disease.  Continue to darbopoietin.  5.  Metabolic bone disease.  Check PTH.  Continue phosphate binder.  6.  Large right pleural effusion.  Improved with thoracentesis.  [place], DO, [place]

## 2019-06-11 NOTE — Progress Notes (Signed)
Patient called the nurse in the room to talk about not going through with the AVF vs graft procedure and HD in general. Patient stated that he is afraid of needles and does not want to be stuck all time. He stated that he is tired and does not want to go through with anything. He states that he agreed to do HD for his family but now feels like he made the wrong decision. He feels like he needs to do this for him rather than anyone else. I listened and gave emotional support to patient. He will talk to wife and kids in am. I will reassess before HD and will pass on to day shift nurse.   [facility] RN

## 2019-06-11 NOTE — Progress Notes (Signed)
Hemodialysis called for report, patient still refusing HD. Will report to day shift nurse.

## 2019-06-11 NOTE — Progress Notes (Signed)
ANTICOAGULATION CONSULT NOTE - Initial Consult  Pharmacy Consult for heparin  Indication: atrial fibrillation  Allergies  Allergen Reactions  . Flexeril [Cyclobenzaprine] Other (See Comments)    Unknown reaction     Patient Measurements: Weight: 208 lb 15.9 oz (94.8 kg) Heparin Dosing Weight: 92kg  Vital Signs: Temp: 97.7 F (36.5 C) (06/22 1303) Temp Source: Oral (06/22 1303) BP: 104/42 (06/22 1303) Pulse Rate: 79 (06/22 1303)  Labs: Recent Labs    06/09/19 0844 06/09/19 1329 06/10/19 1104  HGB  --   --  9.5*  HCT  --  30.1* 33.6*  PLT  --   --  190  LABPROT 17.6*  --   --   INR 1.5*  --   --   CREATININE  --   --  4.54*    Estimated Creatinine Clearance: 14.2 mL/min (A) (by C-G formula based on SCr of 4.54 mg/dL (H)).   Medical History: Past Medical History:  Diagnosis Date  . Alcohol abuse   . Colon cancer ([place])   . COPD (chronic obstructive pulmonary disease) ([place])   . Emphysema of lung ([place])   . ESRD (end stage renal disease) ([place])   . Former tobacco use   . Hypertension   . Peripheral edema   . Sepsis ([place]) 10/2016   Aspiration PNA/C diff colitis    Medications:  Medications Prior to Admission  Medication Sig Dispense Refill Last Dose  . allopurinol (ZYLOPRIM) 100 MG tablet Take 100 mg by mouth daily.     Marland Kitchen amiodarone (PACERONE) 200 MG tablet Take 1 tablet (200 mg total) by mouth daily. 90 tablet 0   . apixaban (ELIQUIS) 2.5 MG TABS tablet Take 1 tablet (2.5 mg total) by mouth 2 (two) times daily. 60 tablet 0   . bisoprolol (ZEBETA) 5 MG tablet Take 5 mg by mouth daily.     . budesonide (PULMICORT) 0.25 MG/2ML nebulizer solution Take 2 mLs (0.25 mg total) by nebulization 2 (two) times daily. 60 mL 0   . ferrous sulfate 325 (65 FE) MG tablet Take 325 mg by mouth daily.      . furosemide (LASIX) 40 MG tablet Take 1 tablet (40 mg total) by mouth 2 (two) times daily. 60 tablet 0   . ipratropium-albuterol (DUONEB) 0.5-2.5 (3) MG/3ML SOLN Use twice a  day scheduled and every 4 hours as needed for shortness of breath and wheezing (Patient not taking: Reported on 05/19/2019) 360 mL 0   . Multiple Vitamin (MULTIVITAMIN WITH MINERALS) TABS tablet Take 1 tablet by mouth daily.     . OXYGEN Inhale 4 L into the lungs continuous.      Scheduled:  . allopurinol  100 mg Oral Daily  . amiodarone  200 mg Oral Daily  . budesonide  0.25 mg Nebulization BID  . Chlorhexidine Gluconate Cloth  6 each Topical Q0600  . darbepoetin (ARANESP) injection - DIALYSIS  150 mcg Intravenous Q Sat-HD  . furosemide  80 mg Intravenous Q12H  . guaiFENesin  600 mg Oral BID  . ipratropium-albuterol  3 mL Nebulization BID  . multivitamin  1 tablet Oral QHS  . sevelamer carbonate  800 mg Oral TID WC    Assessment: 85 yo male with afib on apixaban PTA (last dose was given 06/07/19).  He is noted with ESRD and plans are for AVF placement. Pharmacy consulted to dose heparin  -hg= 9.5, plt= 190 -he received 3800 units on heparin with HD today at ~ 1pm  Goal of  Therapy:  Heparin level 0.3-0.7 units/ml Monitor platelets by anticoagulation protocol: Yes   Plan:  -heparin 2000 units IV bolus followed by 1100 units/hr -Heparin level in 8 hours and daily wth CBC daily  Hildred Laser, PharmD Clinical Pharmacist **Pharmacist phone directory can now be found on [email or web address] ([email or web address]).  Listed under [place].

## 2019-06-11 NOTE — Care Management Important Message (Signed)
Important Message  Patient Details  Name: David Barton MRN: [id number] Date of Birth: [birth year]   Medicare Important Message Given:  Yes     Shelda Altes 06/11/2019, 1:16 PM

## 2019-06-11 NOTE — Progress Notes (Signed)
Patient was scheduled for left arm AVF with me today in the OR.  OR called for patient and he was being transported to dialysis.  I discussed with Dr. Maryjane Hurter who felt given his respiratory issues yesterday he needs dialysis today and his fistula needs to be delayed.  Will find time later later in the week when more stable.  Marty Heck, MD [facility] Office: [phone] Pager: [place]

## 2019-06-11 NOTE — Progress Notes (Signed)
RT encouraged patient to wear BIPAP tonight per MD note. Patient refused BIPAP stating that it makes him claustrophobic. RT informed patient to have RT called if he changes his mind. RT will monitor as needed.

## 2019-06-11 NOTE — Progress Notes (Signed)
Patient ID: David Barton, male   DOB: 1933-01-04, 85 y.o.   MRN: [id number]  This NP visited patient at the bedside as a follow up for  palliative medicine needs and emotional support.  I returned a call to the patient's wife who was concerned and frustrated with confusion with treatment plan as it relates to placement of the fistula and overall logistics of dialysis into the future.   Education offered.  Questions and concerns addressed.  Emotional support offered.  I then visited [name] in the dialysis center.  We had a discussion around the fact that he was again waffling on the decision to move forward with ongoing dialysis.  Today he is on the path for ongoing dialysis.  He understands that a fistula will be placed and that when he is discharged from the hospital he will continue dialysis in an outpatient dialysis clinic.  [name] understands the seriousness of his medical situations and is going to take it one day at a time.  "I just do not know how long I can do this"  Ultimately his hope is that dialysis will improve his overall health and wellness and provide some more quality time with his family.  Discussed with patient the importance of continued conversation with his  Family/wife  and the medical providers regarding overall plan of care and treatment options,  ensuring decisions are within the context of the patients values and GOCs.  Total time spent on the unit was 45 minutes    Questions and concerns addressed.  Discussed with  [name]   PMT will continue to support holistically  Greater than 50% of the time was spent in counseling and coordination of care  Wadie Lessen NP  Palliative Medicine Team Team Phone # [phone] Pager [phone]

## 2019-06-11 NOTE — Progress Notes (Signed)
Renal Navigator received call from patient's wife this morning, who sounds very anxious about her husband's HD initiation. She has numerous questions and would like to speak to a doctor, although she informs Renal Navigator that she already has. Renal Navigator passed along message to Dr. Grayland Ormond that patient's wife would like to speak with her.  Patient has been accepted at [facility]) on a TTS schedule with a seat time of 12:15pm. Patient needs to arrive at 11:10am on first day of treatment if this falls on a Tuesday or Thursday and needs to report to the clinic on the Friday prior to starting on Saturday to complete paperwork.  Renal Navigator informed Nephrologist/Dr. Grayland Ormond and patient's wife of seat schedule. Renal Navigator will follow for discharge plan and notify clinic of patient's start date in the OP HD clinic.  [facility] [address], [place], [place]  Alphonzo Cruise,  Renal Navigator [phone]

## 2019-06-11 NOTE — Progress Notes (Signed)
PROGRESS NOTE  David Barton ENI:[id number] DOB: 20-Sep-1933 DOA: 06/07/2019 PCP: Maury Dus, MD   LOS: 4 days   Patient is from: Home.  Brief Narrative / Interim history: 85 year old with past medical history of COPD/emphysema, chronic hypoxia on home 2L O2, atrial fibrillation, systolic CHF ejection fraction 30%, essential hypertension, CKD stage V, colon cancer presented to the hospital to start hemodialysis.  Recently for a moderate right-sided pleural effusion requiring IV antibiotic treatment and thoracentesis.  Initially required significant amount of oxygen during the hospitalization.  Permacath placement and then fistula placement.  IR and vascular following.  On 6/20 overnight patient developed acute shortness of breath and hypercarbia requiring BiPAP.  X-ray showed complete whiteout on the right side.  Assessment & Plan: Acute hypoxemic hypercarbic respiratory failure: Multifactorial including COPD and right-sided pleural effusion.  Patient was weaned off BiPAP to high flow nasal cannula overnight. -Continue weaning oxygen -Encourage nightly BiPAP. -Manage pleural effusion, COPD and other comorbidities as below -Continue breathing treatments  Large right-sided pleural effusion -Status post thoracocentesis on 6/21 with removal of 2.3 L of pleural fluid which appears to be transudative although no serum LDH or protein to make conclusion. -Pleural fluid Gram stain negative. -Follow pleural fluid cultures  Acute metabolic encephalopathy secondary to CO2 narcosis-resolved -Management as above  COPD with chronic hypoxia 2L O2 but currently on BiPAP -Budesonide and bronchodilators.  -Wean oxygen as able  Acute on chronic respiratory acidosis-likely due to the above -Continue nightly BiPAP.  Chronic Systolic CHF, ef 61%: Appears euvolemic. 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  -Patient  appears to be undecided about long-term dialysis. -Palliative care consulted-input appreciated. -Tunneled HD cath placed 6/20. -Vascular surgery to place fistula or graft.  P Afib, on Eliquis  -on Amiodarone -Resume anticoagulation after access.  Anemia of Chronic Disease: Stable. -Per nephrology.  Essential HTN: Normotensive. -Lasix as above.  Scheduled Meds:  allopurinol  100 mg Oral Daily   amiodarone  200 mg Oral Daily   budesonide  0.25 mg Nebulization BID   Chlorhexidine Gluconate Cloth  6 each Topical Q0600   darbepoetin (ARANESP) injection - DIALYSIS  150 mcg Intravenous Q Sat-HD   furosemide  80 mg Intravenous Q12H   guaiFENesin  600 mg Oral BID   ipratropium-albuterol  3 mL Nebulization BID   multivitamin  1 tablet Oral QHS   sevelamer carbonate  800 mg Oral TID WC   Continuous Infusions:   ceFAZolin (ANCEF) IV     PRN Meds:.acetaminophen **OR** acetaminophen, camphor-menthol, ipratropium-albuterol, prochlorperazine, traZODone  DVT prophylaxis: We will start heparin for A. fib Code Status: DNR Family Communication: Pending Disposition Plan: Remains inpatient  Subjective: No major events overnight or this morning.  Weaned off BiPAP to high flow nasal cannula.  Respiratory status improved.  Patient is undecided about long-term dialysis.  He feels he made the wrong decision because of his family.  He says he is 85 not 55.  He would like to talk to palliative care.   Objective: Vitals:   06/10/19 2003 06/10/19 2135 06/11/19 0540 06/11/19 0736  BP:  121/64 (!) 107/51   Pulse:  90 92 79  Resp:  20 20 (!) 21  Temp:  97.6 F (36.4 C) 98.6 F (37 C)   TempSrc:  Axillary Oral   SpO2: 98% 94% 91% 93%  Weight:   101.2 kg    No intake or output data in the  24 hours ending 06/11/19 0844 Filed Weights   06/09/19 2111 06/10/19 0531 06/11/19 0540  Weight: 100.7 kg 101.2 kg 101.2 kg    Examination:  GENERAL: No acute distress.  Appears well.  HEENT:  MMM.  Vision and hearing grossly intact.  NECK: Supple.  No JVD.  LUNGS:  No IWOB. Good air movement bilaterally. HEART: IR.  Normal rate.  Heart sounds normal.  ABD: Bowel sounds present. Soft. Non tender.  MSK/EXT:  Moves all extremities. No apparent deformity. No edema bilaterally.  SKIN: no apparent skin lesion or wound NEURO: Awake, alert and oriented appropriately.  No gross deficit.  PSYCH: Calm. Normal affect.   Consultants:   Nephrology  Vascular surgery  Palliative care  Procedures:   Tunneled HD cath on 6/20  Microbiology:  COVID-19 negative.  Pleural fluid culture negative.  Antimicrobials: Anti-infectives (From admission, onward)   Start     Dose/Rate Route Frequency Ordered Stop   06/11/19 0600  ceFAZolin (ANCEF) IVPB 2g/100 mL premix     2 g 200 mL/hr over 30 Minutes Intravenous To ShortStay Surgical 06/08/19 0748 06/12/19 0600   06/09/19 1044  ceFAZolin (ANCEF) 2-4 GM/100ML-% IVPB    Note to Pharmacy: Novella Rob   : cabinet override      06/09/19 1044 06/09/19 1127       Data Reviewed: I have independently reviewed following labs and imaging studies   CBC: Recent Labs  Lab 06/07/19 1531 06/08/19 0650 06/10/19 1104  WBC 11.6* 8.7 10.2  HGB 8.6* 8.6* 9.5*  HCT 28.7* 29.0* 33.6*  MCV 107.5* 107.8* 112.8*  PLT 248 231 191   Basic Metabolic Panel: Recent Labs  Lab 06/04/19 1222 06/06/19 1301 06/07/19 0746 06/08/19 0650 06/10/19 1104  NA 138 139 142 142 142  K 5.9* 4.4 4.3 3.7 4.2  CL 98 99 101 100 100  CO2 27 25 29 29 26   GLUCOSE 126* 146* 108* 104* 109*  BUN 120* 132* 130* 131* 92*  CREATININE 5.79* 5.81* 5.64* 5.56* 4.54*  CALCIUM 8.9 8.9 8.9 8.7* 8.6*  MG  --   --   --   --  2.1  PHOS 7.0* 6.9* 7.1* 7.1* 7.6*   GFR: Estimated Creatinine Clearance: 14.7 mL/min (A) (by C-G formula based on SCr of 4.54 mg/dL (H)). Liver Function Tests: Recent Labs  Lab 06/04/19 1222 06/06/19 1301 06/07/19 0746 06/08/19 0650  ALBUMIN 2.6*  2.6* 2.5* 2.5*   No results for input(s): LIPASE, AMYLASE in the last 168 hours. No results for input(s): AMMONIA in the last 168 hours. Coagulation Profile: Recent Labs  Lab 06/07/19 1531 06/08/19 1440 06/09/19 0844  INR 2.0* 1.6* 1.5*   Cardiac Enzymes: No results for input(s): CKTOTAL, CKMB, CKMBINDEX, TROPONINI in the last 168 hours. BNP (last 3 results) Recent Labs    05/04/19 1137  PROBNP 443.0*   HbA1C: No results for input(s): HGBA1C in the last 72 hours. CBG: Recent Labs  Lab 06/04/19 2243 06/05/19 0743 06/08/19 1151  GLUCAP 132* 111* 96   Lipid Profile: No results for input(s): CHOL, HDL, LDLCALC, TRIG, CHOLHDL, LDLDIRECT in the last 72 hours. Thyroid Function Tests: Recent Labs    06/09/19 1329  TSH 3.913   Anemia Panel: Recent Labs    06/09/19 1329  VITAMINB12 1,085*   Urine analysis:    Component Value Date/Time   COLORURINE YELLOW 06/03/2019 1022   APPEARANCEUR HAZY (A) 06/03/2019 1022   LABSPEC 1.014 06/03/2019 1022   PHURINE 5.0 06/03/2019 1022   GLUCOSEU  NEGATIVE 06/03/2019 1022   HGBUR NEGATIVE 06/03/2019 [place] 06/03/2019 1022   KETONESUR NEGATIVE 06/03/2019 1022   PROTEINUR 30 (A) 06/03/2019 1022   UROBILINOGEN 1.0 05/08/2013 1738   NITRITE NEGATIVE 06/03/2019 1022   LEUKOCYTESUR NEGATIVE 06/03/2019 1022   Sepsis Labs: Invalid input(s): PROCALCITONIN, LACTICIDVEN  Recent Results (from the past 240 hour(s))  SARS Coronavirus 2 (CEPHEID - Performed in [place] hospital lab), Hosp Order     Status: None   Collection Time: 06/01/19  4:32 PM   Specimen: Nasopharyngeal Swab  Result Value Ref Range Status   SARS Coronavirus 2 NEGATIVE NEGATIVE Final    Comment: (NOTE) If result is NEGATIVE SARS-CoV-2 target nucleic acids are NOT DETECTED. The SARS-CoV-2 RNA is generally detectable in upper and lower  respiratory specimens during the acute phase of infection. The lowest  concentration of SARS-CoV-2 viral  copies this assay can detect is 250  copies / mL. A negative result does not preclude SARS-CoV-2 infection  and should not be used as the sole basis for treatment or other  patient management decisions.  A negative result may occur with  improper specimen collection / handling, submission of specimen other  than nasopharyngeal swab, presence of viral mutation(s) within the  areas targeted by this assay, and inadequate number of viral copies  (<250 copies / mL). A negative result must be combined with clinical  observations, patient history, and epidemiological information. If result is POSITIVE SARS-CoV-2 target nucleic acids are DETECTED. The SARS-CoV-2 RNA is generally detectable in upper and lower  respiratory specimens dur ing the acute phase of infection.  Positive  results are indicative of active infection with SARS-CoV-2.  Clinical  correlation with patient history and other diagnostic information is  necessary to determine patient infection status.  Positive results do  not rule out bacterial infection or co-infection with other viruses. If result is PRESUMPTIVE POSTIVE SARS-CoV-2 nucleic acids MAY BE PRESENT.   A presumptive positive result was obtained on the submitted specimen  and confirmed on repeat testing.  While 2019 novel coronavirus  (SARS-CoV-2) nucleic acids may be present in the submitted sample  additional confirmatory testing may be necessary for epidemiological  and / or clinical management purposes  to differentiate between  SARS-CoV-2 and other Sarbecovirus currently known to infect humans.  If clinically indicated additional testing with an alternate test  methodology [phone]) is advised. The SARS-CoV-2 RNA is generally  detectable in upper and lower respiratory sp ecimens during the acute  phase of infection. The expected result is Negative. Fact Sheet for Patients:  StrictlyIdeas.no Fact Sheet for Healthcare  Providers: BankingDealers.co.za This test is not yet approved or cleared by the [place] FDA and has been authorized for detection and/or diagnosis of SARS-CoV-2 by FDA under an Emergency Use Authorization (EUA).  This EUA will remain in effect (meaning this test can be used) for the duration of the COVID-19 declaration under Section 564(b)(1) of the Act, 21 U.S.C. section 360bbb-3(b)(1), unless the authorization is terminated or revoked sooner. Performed at [facility], [place] [address] [place], [place] [address]   Culture, body fluid-bottle     Status: None   Collection Time: 06/02/19 10:46 AM   Specimen: Pleura  Result Value Ref Range Status   Specimen Description PLEURAL RIGHT  Final   Special Requests NONE  Final   Culture   Final    NO GROWTH 5 DAYS Performed at [facility] [address]  [place], [place] [address]    Report Status 06/07/2019 FINAL  Final  Gram stain     Status: None   Collection Time: 06/02/19 10:46 AM   Specimen: Pleura  Result Value Ref Range Status   Specimen Description PLEURAL RIGHT  Final   Special Requests NONE  Final   Gram Stain   Final    RARE WBC PRESENT, PREDOMINANTLY PMN NO ORGANISMS SEEN Performed at [facility], [place] [address], [place], [place] [address]    Report Status 06/03/2019 FINAL  Final  Surgical pcr screen     Status: None   Collection Time: 06/09/19  9:49 AM   Specimen: Nasal Mucosa; Nasal Swab  Result Value Ref Range Status   MRSA, PCR NEGATIVE NEGATIVE Final   Staphylococcus aureus NEGATIVE NEGATIVE Final    Comment: (NOTE) The Xpert SA Assay (FDA approved for NASAL specimens in patients 47 years of age and older), is one component of a comprehensive surveillance program. It is not intended to diagnose infection nor to guide or monitor treatment. Performed at [facility], [place] [address], [place], [place] [address]   SARS Coronavirus 2 (CEPHEID - Performed in [place] hospital lab), Hosp Order     Status: None   Collection Time: 06/10/19  9:35 AM   Specimen: Nasopharyngeal Swab  Result Value Ref Range Status   SARS Coronavirus 2 NEGATIVE NEGATIVE Final    Comment: (NOTE) If result is NEGATIVE SARS-CoV-2 target nucleic acids are NOT DETECTED. The SARS-CoV-2 RNA is generally detectable in upper and lower  respiratory specimens during the acute phase of infection. The lowest  concentration of SARS-CoV-2 viral copies this assay can detect is 250  copies / mL. A negative result does not preclude SARS-CoV-2 infection  and should not be used as the sole basis for treatment or other  patient management decisions.  A negative result may occur with  improper specimen collection / handling, submission of specimen other  than nasopharyngeal swab, presence of viral mutation(s) within the  areas targeted by this assay, and inadequate number of viral copies  (<250 copies / mL). A negative result must be combined with clinical  observations, patient history, and epidemiological information. If result is POSITIVE SARS-CoV-2 target nucleic acids are DETECTED. The SARS-CoV-2 RNA is generally detectable in upper and lower  respiratory specimens dur ing the acute phase of infection.  Positive  results are indicative of active infection with SARS-CoV-2.  Clinical  correlation with patient history and other diagnostic information is  necessary to determine patient infection status.  Positive results do  not rule out bacterial infection or co-infection with other viruses. If result is PRESUMPTIVE POSTIVE SARS-CoV-2 nucleic acids MAY BE PRESENT.   A presumptive positive result was obtained on the submitted specimen  and confirmed on repeat testing.  While 2019 novel coronavirus  (SARS-CoV-2) nucleic acids may be present in the submitted sample  additional confirmatory testing may be necessary for epidemiological  and / or clinical management purposes  to  differentiate between  SARS-CoV-2 and other Sarbecovirus currently known to infect humans.  If clinically indicated additional testing with an alternate test  methodology [phone]) is advised. The SARS-CoV-2 RNA is generally  detectable in upper and lower respiratory sp ecimens during the acute  phase of infection. The expected result is Negative. Fact Sheet for Patients:  StrictlyIdeas.no Fact Sheet for Healthcare Providers: BankingDealers.co.za This test is not yet approved or cleared by the [place] FDA and has  been authorized for detection and/or diagnosis of SARS-CoV-2 by FDA under an Emergency Use Authorization (EUA).  This EUA will remain in effect (meaning this test can be used) for the duration of the COVID-19 declaration under Section 564(b)(1) of the Act, 21 U.S.C. section 360bbb-3(b)(1), unless the authorization is terminated or revoked sooner. Performed at [facility], [place] [address] [place], [place] [address]   Gram stain     Status: None   Collection Time: 06/10/19 12:32 PM   Specimen: Pleura  Result Value Ref Range Status   Specimen Description PLEURAL RIGHT  Final   Special Requests NONE  Final   Gram Stain   Final    RARE WBC PRESENT, PREDOMINANTLY PMN NO ORGANISMS SEEN Performed at [facility], [place] [address], [place], [place] [address]    Report Status 06/10/2019 FINAL  Final     Radiology Studies: Dg Chest Port 1 View  Result Date: 06/10/2019 CLINICAL DATA:  Status post right-sided thoracentesis. EXAM: PORTABLE CHEST 1 VIEW COMPARISON:  Chest x-ray 06/09/2001 FINDINGS: Twenty evacuation of the large right pleural fluid collection. Minimal residual fluid at the lung base. Patchy right lower lobe atelectasis. No postprocedural pneumothorax. There is a small left pleural effusion. Right IJ dialysis catheter in good position, unchanged. Stable mild cardiac enlargement. IMPRESSION: 1. Near complete  evacuation of the large right pleural fluid collection without postprocedural pneumothorax. 2. Small left pleural effusion. 3. Bibasilar atelectasis. Electronically Signed   By: Marijo Sanes M.D.   On: 06/10/2019 13:07   Vas [place] Upper Ext Vein Mapping (pre-op Avf)  Result Date: 06/10/2019 UPPER EXTREMITY VEIN MAPPING  Indications: Pre-dialysis access. Comparison Study: No prior study on file for comparison. Performing Technologist: Sharion Dove RVS  Examination Guidelines: A complete evaluation includes B-mode imaging, spectral Doppler, color Doppler, and power Doppler as needed of all accessible portions of each vessel. Bilateral testing is considered an integral part of a complete examination. Limited examinations for reoccurring indications may be performed as noted. +-----------------+-------------+----------+--------+  Right Cephalic    Diameter (cm) Depth (cm) Findings  +-----------------+-------------+----------+--------+  Prox upper arm        0.18         0.62              +-----------------+-------------+----------+--------+  Mid upper arm         0.18         0.63              +-----------------+-------------+----------+--------+  Dist upper arm        0.22         0.39              +-----------------+-------------+----------+--------+  Antecubital fossa     0.50         0.89              +-----------------+-------------+----------+--------+  Prox forearm          0.36         0.88              +-----------------+-------------+----------+--------+  Mid forearm           0.36         0.58              +-----------------+-------------+----------+--------+  Wrist                 0.43         0.49              +-----------------+-------------+----------+--------+ +-----------------+-------------+----------+---------------------------+  Right Basilic     Diameter (cm) Depth (cm)          Findings            +-----------------+-------------+----------+---------------------------+  Prox upper arm         0.32         0.37                                 +-----------------+-------------+----------+---------------------------+  Mid upper arm         0.32         0.49                                 +-----------------+-------------+----------+---------------------------+  Dist upper arm        0.40         0.40                                 +-----------------+-------------+----------+---------------------------+  Antecubital fossa     0.62         0.26                                 +-----------------+-------------+----------+---------------------------+  Prox forearm                               not visualized and bandages  +-----------------+-------------+----------+---------------------------+  Mid forearm           0.32         0.17                                 +-----------------+-------------+----------+---------------------------+  Wrist                 0.34         0.16                                 +-----------------+-------------+----------+---------------------------+ *See table(s) above for measurements and observations.  Diagnosing physician:    Preliminary    US Thoracentesis Asp Pleural Space W/img Guide  Result Date: 06/10/2019 INDICATION: Patient with history of colon cancer, COPD, HF, CKD, dyspnea, and recurrent right pleural effusion. Request is made for diagnostic and therapeutic right thoracentesis. EXAM: ULTRASOUND GUIDED DIAGNOSTIC AND THERAPEUTIC RIGHT THORACENTESIS MEDICATIONS: 15 mL 1% lidocaine COMPLICATIONS: None immediate. PROCEDURE: An ultrasound guided thoracentesis was thoroughly discussed with the patient and questions answered. The benefits, risks, alternatives and complications were also discussed. The patient understands and wishes to proceed with the procedure. Written consent was obtained. Ultrasound was performed to localize and mark an adequate pocket of fluid in the right chest. The area was then prepped and draped in the normal sterile fashion. 1% Lidocaine was used  for local anesthesia. Under ultrasound guidance a 6 Fr Safe-T-Centesis catheter was introduced. Thoracentesis was performed. The catheter was removed and a dressing applied. FINDINGS: A total of approximately 2.3 L of dark red fluid was removed. Samples were sent to the laboratory as requested by the clinical team. IMPRESSION: Successful ultrasound guided right thoracentesis yielding 2.3 L of  pleural fluid. Read by: Earley Abide, PA-C Electronically Signed   By: Markus Daft M.D.   On: 06/10/2019 13:22   Esmirna Ravan T. [place]  If 7PM-7AM, please contact night-coverage [email or web address] Password [email or web address] 06/11/2019, 8:44 AM

## 2019-06-12 ENCOUNTER — Inpatient Hospital Stay (HOSPITAL_COMMUNITY): Payer: Medicare Other

## 2019-06-12 DIAGNOSIS — R531 Weakness: Secondary | ICD-10-CM

## 2019-06-12 LAB — MAGNESIUM: Magnesium: 1.9 mg/dL (ref 1.7–2.4)

## 2019-06-12 LAB — CBC
HCT: 32.8 % — ABNORMAL LOW (ref 39.0–52.0)
Hemoglobin: 9.8 g/dL — ABNORMAL LOW (ref 13.0–17.0)
MCH: 32.1 pg (ref 26.0–34.0)
MCHC: 29.9 g/dL — ABNORMAL LOW (ref 30.0–36.0)
MCV: 107.5 fL — ABNORMAL HIGH (ref 80.0–100.0)
Platelets: 185 10*3/uL (ref 150–400)
RBC: 3.05 MIL/uL — ABNORMAL LOW (ref 4.22–5.81)
RDW: 18.6 % — ABNORMAL HIGH (ref 11.5–15.5)
WBC: 9.4 10*3/uL (ref 4.0–10.5)
nRBC: 0 % (ref 0.0–0.2)

## 2019-06-12 LAB — HEPARIN LEVEL (UNFRACTIONATED): Heparin Unfractionated: 1.02 IU/mL — ABNORMAL HIGH (ref 0.30–0.70)

## 2019-06-12 LAB — BASIC METABOLIC PANEL
Anion gap: 11 (ref 5–15)
BUN: 52 mg/dL — ABNORMAL HIGH (ref 8–23)
CO2: 28 mmol/L (ref 22–32)
Calcium: 8.4 mg/dL — ABNORMAL LOW (ref 8.9–10.3)
Chloride: 101 mmol/L (ref 98–111)
Creatinine, Ser: 3.63 mg/dL — ABNORMAL HIGH (ref 0.61–1.24)
GFR calc Af Amer: 17 mL/min — ABNORMAL LOW (ref >60)
GFR calc non Af Amer: 15 mL/min — ABNORMAL LOW (ref >60)
Glucose, Bld: 102 mg/dL — ABNORMAL HIGH (ref 70–99)
Potassium: 3.7 mmol/L (ref 3.5–5.1)
Sodium: 140 mmol/L (ref 135–145)

## 2019-06-12 LAB — HEPATITIS B CORE ANTIBODY, TOTAL: Hep B Core Total Ab: NEGATIVE

## 2019-06-12 LAB — APTT
aPTT: 50 seconds — ABNORMAL HIGH (ref 24–36)
aPTT: 35 seconds (ref 24–36)

## 2019-06-12 LAB — HEPATITIS B SURFACE ANTIBODY,QUALITATIVE: Hep B S Ab: NONREACTIVE

## 2019-06-12 MED ORDER — FUROSEMIDE 10 MG/ML IJ SOLN: 80.0000 mg | Freq: Once | INTRAMUSCULAR | Status: DC

## 2019-06-12 MED ORDER — CHLORHEXIDINE GLUCONATE CLOTH 2 % EX PADS
6.0000 | MEDICATED_PAD | Freq: Every day | CUTANEOUS | Status: DC
  Administered 2019-06-13 – 2019-06-14 (×2): 6 via TOPICAL

## 2019-06-12 MED ORDER — FUROSEMIDE 10 MG/ML IJ SOLN
80.0000 mg | Freq: Once | INTRAMUSCULAR | Status: AC
  Administered 2019-06-12: 80 mg via INTRAVENOUS
  Filled 2019-06-12: qty 8

## 2019-06-12 NOTE — Progress Notes (Signed)
Patient ID: David Barton, male   DOB: Jun 26, 1933, 85 y.o.   MRN: [id number]  This NP visited patient at the bedside as a follow up for  palliative medicine needs and emotional support.   I visited David Barton at bedside, he is out of bed to the chair, alert and oriented, engaged in conversation and "feeling pretty good today".  He shows me where his fistula was placed and says that it is just "a little sore"  Patient is improving and moving forward. Ultimately his hope is that dialysis will improve his overall health and wellness and provide some more quality time with his family.  Discussed that he likely would be being discharged from the hospital in the next day or so.  He is a little bit apprehensive about how things will be at home and the care that he will need.  I encouraged David Barton to incorporate into his day active range of motion exercises and gentle movement, to increase his strength.  Questions and concerns addressed.  Emotional support offered.  Recommend outpatient community-based palliative care services on discharge  Once again I discussed with patient the importance of continued conversation with his  Family/wife  and the medical providers regarding overall plan of care and treatment options,  ensuring decisions are within the context of the patients values and GOCs.  Total time spent on the unit was 25 minutes    Questions and concerns addressed.  Discussed with  [name]   PMT will continue to support holistically  Greater than 50% of the time was spent in counseling and coordination of care  David Lessen NP  Palliative Medicine Team Team Phone # [phone] Pager [phone]

## 2019-06-12 NOTE — Progress Notes (Signed)
[facility]  Subjective  - Feels better.   Objective 105/62 84 (!) 97.5 F (36.4 C) (Oral) 20 91%  Intake/Output Summary (Last 24 hours) at 06/12/2019 0802 Last data filed at 06/12/2019 0617 Gross per 24 hour  Intake 240 ml  Output 3450 ml  Net -3210 ml    Palpable left radial and brachial pulse  Laboratory Lab Results: Recent Labs    06/10/19 1104 06/12/19 0634  WBC 10.2 9.4  HGB 9.5* 9.8*  HCT 33.6* 32.8*  PLT 190 185   BMET Recent Labs    06/10/19 1104 06/12/19 0634  NA 142 140  K 4.2 3.7  CL 100 101  CO2 26 28  GLUCOSE 109* 102*  BUN 92* 52*  CREATININE 4.54* 3.63*  CALCIUM 8.6* 8.4*    COAG Lab Results  Component Value Date   INR 1.5 (H) 06/09/2019   INR 1.6 (H) 06/08/2019   INR 2.0 (H) 06/07/2019   No results found for: PTT  Assessment/Planning:  AVF cancelled yesterday due to emergent need for dialysis.  Feels better today and looks much better.  Looks like plan to dialyze again on Wednesday.  As a result, will schedule AVF for Thursday.  Need to restrict left arm.  Looks like decent cephalic vein on duplex.  Marty Heck 06/12/2019 8:02 AM --

## 2019-06-12 NOTE — Progress Notes (Signed)
PROGRESS NOTE  David Barton JKK:[id number] DOB: 1933/07/15 DOA: 06/07/2019 PCP: Maury Dus, MD   LOS: 5 days   Patient is from: Home.  Brief Narrative / Interim history: 85 year old with past medical history of COPD/emphysema, chronic hypoxia on home 2L O2, atrial fibrillation, systolic CHF ejection fraction 30%, essential hypertension, CKD stage V, colon cancer presented to the hospital to start hemodialysis.  Recently for a moderate right-sided pleural effusion requiring IV antibiotic treatment and thoracentesis.  Initially required significant amount of oxygen during the hospitalization.  Permacath placement and then fistula placement.  IR and vascular following.  On 6/20 overnight patient developed acute shortness of breath and hypercarbia requiring BiPAP.  X-ray showed complete whiteout on the right side due to pleural effusion.  Had thoracocentesis on 6/21 with removal of 2.3 L of transudative pleural fluid.  Cultures have been negative.  Breathing improved.  Assessment & Plan: Acute hypoxemic hypercarbic respiratory failure: Multifactorial including COPD and right-sided pleural effusion and possible right lung PNA.  However, no fever or leukocytosis. -On 5 L by high flow nasal cannula. -Continue weaning oxygen -Manage pleural effusion, COPD and other comorbidities as below -Continue breathing treatments  Large right-sided pleural effusion -Thora on 6/21 with removal of 2.3 L of pleural fluid  -Fluid appears to be transudative although no serum LDH or protein -Pleural fluid Gram stain and cultures negative. -CXR on 6/22 with small bilateral pleural effusion and airspace disease.  -Patient has no fever or leukocytosis.  Respiratory status improving.  -Repeat portable chest x-ray  Acute metabolic encephalopathy secondary to CO2 narcosis-resolved -Management as above  COPD with chronic hypoxia 2L -On 5 L by high flow nasal cannula today. -Budesonide and bronchodilators.   -Wean oxygen as able  Acute on chronic respiratory acidosis-likely due to the above  -Continue weaning oxygen -Continue nightly BiPAP.  Chronic Systolic CHF, ef 71%: Appears euvolemic. 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  -Tunneled HD cath placed 6/20. -First HD on 6/22 -aVF or graft on 6/25 -Palliative care consulted-input appreciated.  Permanent Afib, on Eliquis: Rate controlled. -On PO iodarone and heparin drip -Resume anticoagulation after access.  Anemia of Chronic Disease: Stable. -Per nephrology.  Essential HTN: Normotensive. -Lasix as above.  Scheduled Meds: . allopurinol  100 mg Oral Daily  . amiodarone  200 mg Oral Daily  . budesonide  0.25 mg Nebulization BID  . Chlorhexidine Gluconate Cloth  6 each Topical Q0600  . darbepoetin (ARANESP) injection - DIALYSIS  150 mcg Intravenous Q Sat-HD  . furosemide  80 mg Intravenous Q12H  . guaiFENesin  600 mg Oral BID  . ipratropium-albuterol  3 mL Nebulization BID  . multivitamin  1 tablet Oral QHS  . sevelamer carbonate  800 mg Oral TID WC   Continuous Infusions: . heparin 1,300 Units/hr (06/12/19 0913)   PRN Meds:.acetaminophen **OR** acetaminophen, camphor-menthol, ipratropium-albuterol, prochlorperazine, traZODone  DVT prophylaxis: We will start heparin for A. fib Code Status: DNR Family Communication: Pending Disposition Plan: Remains inpatient pending AVF and outpatient HD set up  Subjective: No major events overnight or this morning.  Reports having a good night.  Denies chest pain, dyspnea, cough, GI or GU symptoms.  On 5 L by high flow nasal cannula.   Objective: Vitals:   06/12/19 0615 06/12/19 0724 06/12/19 0828 06/12/19 0830  BP: 105/62     Pulse:  80    Resp: 20     Temp: Marland Kitchen)  97.5 F (36.4 C)     TempSrc: Oral     SpO2: 91%  93% 91%  Weight: 95.1 kg       Intake/Output Summary (Last 24 hours) at  06/12/2019 1523 Last data filed at 06/12/2019 0617 Gross per 24 hour  Intake -  Output 450 ml  Net -450 ml   Filed Weights   06/11/19 0955 06/11/19 1303 06/12/19 0615  Weight: 98 kg 94.8 kg 95.1 kg    Examination: GENERAL: No acute distress.  Appears well.  HEENT: MMM.  Vision and hearing grossly intact.  NECK: Supple.  No JVD.  HD cath in right IJ LUNGS: Saturating in low 90s on 5 L by HFNC.  No IWOB.  Fair air movement bilaterally.  Rhonchi on the right. HEART: IR.  Normal rate. Heart sounds normal.  ABD: Bowel sounds present. Soft. Non tender.  MSK/EXT:  Moves all extremities. No apparent deformity. No edema bilaterally.  SKIN: no apparent skin lesion or wound NEURO: Awake, alert and oriented appropriately.  No gross deficit.  PSYCH: Calm. Normal affect.  Consultants:   Nephrology  Vascular surgery  Palliative care  Procedures:   Tunneled HD cath on 6/20  First HD on 6/22.  Microbiology: . COVID-19 negative. . Pleural fluid culture negative.  Antimicrobials: Anti-infectives (From admission, onward)   Start     Dose/Rate Route Frequency Ordered Stop   06/11/19 0600  ceFAZolin (ANCEF) IVPB 2g/100 mL premix     2 g 200 mL/hr over 30 Minutes Intravenous To [facility] 06/08/19 0748 06/12/19 0600   06/09/19 1044  ceFAZolin (ANCEF) 2-4 GM/100ML-% IVPB    Note to Pharmacy: Novella Rob   : cabinet override      06/09/19 1044 06/09/19 1127      Data Reviewed: I have independently reviewed following labs and imaging studies   CBC: Recent Labs  Lab 06/07/19 1531 06/08/19 0650 06/09/19 1329 06/10/19 1104 06/12/19 0634  WBC 11.6* 8.7  --  10.2 9.4  HGB 8.6* 8.6*  --  9.5* 9.8*  HCT 28.7* 29.0*  27.5* 30.1* 33.6* 32.8*  MCV 107.5* 107.8*  --  112.8* 107.5*  PLT 248 231  --  190 349   Basic Metabolic Panel: Recent Labs  Lab 06/06/19 1301 06/07/19 0746 06/08/19 0650 06/10/19 1104 06/12/19 0634  NA 139 142 142 142 140  K 4.4 4.3 3.7 4.2 3.7   CL 99 101 100 100 101  CO2 25 29 29 26 28   GLUCOSE 146* 108* 104* 109* 102*  BUN 132* 130* 131* 92* 52*  CREATININE 5.81* 5.64* 5.56* 4.54* 3.63*  CALCIUM 8.9 8.9 8.7* 8.6* 8.4*  MG  --   --   --  2.1 1.9  PHOS 6.9* 7.1* 7.1* 7.6*  --    GFR: Estimated Creatinine Clearance: 17.8 mL/min (A) (by C-G formula based on SCr of 3.63 mg/dL (H)). Liver Function Tests: Recent Labs  Lab 06/06/19 1301 06/07/19 0746 06/08/19 0650  ALBUMIN 2.6* 2.5* 2.5*   No results for input(s): LIPASE, AMYLASE in the last 168 hours. No results for input(s): AMMONIA in the last 168 hours. Coagulation Profile: Recent Labs  Lab 06/07/19 1531 06/08/19 1440 06/09/19 0844  INR 2.0* 1.6* 1.5*   Cardiac Enzymes: No results for input(s): CKTOTAL, CKMB, CKMBINDEX, TROPONINI in the last 168 hours. BNP (last 3 results) Recent Labs    05/04/19 1137  PROBNP 443.0*   HbA1C: No results for input(s): HGBA1C in the last 72 hours. CBG: Recent Labs  Lab 06/08/19 1151 06/08/19 1644 06/08/19 2129 06/09/19 0630  GLUCAP 96 117* 177* 122*   Lipid Profile: No results for input(s): CHOL, HDL, LDLCALC, TRIG, CHOLHDL, LDLDIRECT in the last 72 hours. Thyroid Function Tests: No results for input(s): TSH, T4TOTAL, FREET4, T3FREE, THYROIDAB in the last 72 hours. Anemia Panel: No results for input(s): VITAMINB12, FOLATE, FERRITIN, TIBC, IRON, RETICCTPCT in the last 72 hours. Urine analysis:    Component Value Date/Time   COLORURINE YELLOW 06/03/2019 1022   APPEARANCEUR HAZY (A) 06/03/2019 1022   LABSPEC 1.014 06/03/2019 1022   PHURINE 5.0 06/03/2019 1022   GLUCOSEU NEGATIVE 06/03/2019 1022   HGBUR NEGATIVE 06/03/2019 1022   BILIRUBINUR NEGATIVE 06/03/2019 1022   KETONESUR NEGATIVE 06/03/2019 1022   PROTEINUR 30 (A) 06/03/2019 1022   UROBILINOGEN 1.0 05/08/2013 1738   NITRITE NEGATIVE 06/03/2019 1022   LEUKOCYTESUR NEGATIVE 06/03/2019 1022   Sepsis Labs: Invalid input(s): PROCALCITONIN, LACTICIDVEN   Recent Results (from the past 240 hour(s))  Surgical pcr screen     Status: None   Collection Time: 06/09/19  9:49 AM   Specimen: Nasal Mucosa; Nasal Swab  Result Value Ref Range Status   MRSA, PCR NEGATIVE NEGATIVE Final   Staphylococcus aureus NEGATIVE NEGATIVE Final    Comment: (NOTE) The Xpert SA Assay (FDA approved for NASAL specimens in patients 34 years of age and older), is one component of a comprehensive surveillance program. It is not intended to diagnose infection nor to guide or monitor treatment. Performed at [facility], [place] [address], [place], [name] [address]   SARS Coronavirus 2 (CEPHEID - Performed in [facility]), Hosp Order     Status: None   Collection Time: 06/10/19  9:35 AM   Specimen: Nasopharyngeal Swab  Result Value Ref Range Status   SARS Coronavirus 2 NEGATIVE NEGATIVE Final    Comment: (NOTE) If result is NEGATIVE SARS-CoV-2 target nucleic acids are NOT DETECTED. The SARS-CoV-2 RNA is generally detectable in upper and lower  respiratory specimens during the acute phase of infection. The lowest  concentration of SARS-CoV-2 viral copies this assay can detect is 250  copies / mL. A negative result does not preclude SARS-CoV-2 infection  and should not be used as the sole basis for treatment or other  patient management decisions.  A negative result may occur with  improper specimen collection / handling, submission of specimen other  than nasopharyngeal swab, presence of viral mutation(s) within the  areas targeted by this assay, and inadequate number of viral copies  (<250 copies / mL). A negative result must be combined with clinical  observations, patient history, and epidemiological information. If result is POSITIVE SARS-CoV-2 target nucleic acids are DETECTED. The SARS-CoV-2 RNA is generally detectable in upper and lower  respiratory specimens dur ing the acute phase of infection.  Positive  results are indicative of  active infection with SARS-CoV-2.  Clinical  correlation with patient history and other diagnostic information is  necessary to determine patient infection status.  Positive results do  not rule out bacterial infection or co-infection with other viruses. If result is PRESUMPTIVE POSTIVE SARS-CoV-2 nucleic acids MAY BE PRESENT.   A presumptive positive result was obtained on the submitted specimen  and confirmed on repeat testing.  While 2019 novel coronavirus  (SARS-CoV-2) nucleic acids may be present in the submitted sample  additional confirmatory testing may be necessary for epidemiological  and / or clinical management purposes  to differentiate between  SARS-CoV-2 and other Sarbecovirus  currently known to infect humans.  If clinically indicated additional testing with an alternate test  methodology [phone]) is advised. The SARS-CoV-2 RNA is generally  detectable in upper and lower respiratory sp ecimens during the acute  phase of infection. The expected result is Negative. Fact Sheet for Patients:  StrictlyIdeas.no Fact Sheet for Healthcare Providers: BankingDealers.co.za This test is not yet approved or cleared by the [place] FDA and has been authorized for detection and/or diagnosis of SARS-CoV-2 by FDA under an Emergency Use Authorization (EUA).  This EUA will remain in effect (meaning this test can be used) for the duration of the COVID-19 declaration under Section 564(b)(1) of the Act, 21 U.S.C. section 360bbb-3(b)(1), unless the authorization is terminated or revoked sooner. Performed at [facility], [place] [address] [place], [place] [address]   Culture, body fluid-bottle     Status: None (Preliminary result)   Collection Time: 06/10/19 12:32 PM   Specimen: Pleura  Result Value Ref Range Status   Specimen Description PLEURAL RIGHT  Final   Special Requests NONE  Final   Culture   Final    NO GROWTH 2 DAYS  Performed at [facility] [address], [place], [place] [address]    Report Status PENDING  Incomplete  Gram stain     Status: None   Collection Time: 06/10/19 12:32 PM   Specimen: Pleura  Result Value Ref Range Status   Specimen Description PLEURAL RIGHT  Final   Special Requests NONE  Final   Gram Stain   Final    RARE WBC PRESENT, PREDOMINANTLY PMN NO ORGANISMS SEEN Performed at [facility], [place] [address] [place], [place] [address]    Report Status 06/10/2019 FINAL  Final     Radiology Studies: No results found. Taye T. Ballwin  If 7PM-7AM, please contact night-coverage [email or web address] Password [facility] 06/12/2019, 3:23 PM

## 2019-06-12 NOTE — Progress Notes (Addendum)
[facility]    NEPHROLOGY PROGRESS NOTE  SUBJECTIVE:  Vascular saw pt and plans for AVF on 6/25, Thurs.  Palliative care saw him - he is hoping dialysis will improve his quality of life.  He echoes this sentiment to me - "I'm willing to give it a shot".  HD here is going ok so far per his assessment.  He is afraid of needles - he cites the numbing cream as reason he's willing to go ahead with AVF.  Had 3 kg UF on 6/22 with HD.  No urine output charted 6/22.  Review of systems:  Reports shortness of breath improving Denies cp Denies n/v  OBJECTIVE:  Vitals:   06/12/19 0830 06/12/19 1534  BP:  107/65  Pulse:  (!) 104  Resp:  (!) 23  Temp:  98 F (36.7 C)  SpO2: 91% 90%    Intake/Output Summary (Last 24 hours) at 06/12/2019 1619 Last data filed at 06/12/2019 0617 Gross per 24 hour  Intake -  Output 450 ml  Net -450 ml      General:  AAOx3 NAD HEENT: MMM  AT anicteric sclera Neck:  No JVD, no adenopathy CV:  Heart RRR  Lungs: Improved aeration on the right, left basilar crackles Abd:  abd SNT/ND with normal BS GU:  Bladder non-palpable Extremities: +1 bilateral lower extremity edema. Skin:  No skin rash Access: right chest tunneled catheter   MEDICATIONS:  . allopurinol  100 mg Oral Daily  . amiodarone  200 mg Oral Daily  . budesonide  0.25 mg Nebulization BID  . Chlorhexidine Gluconate Cloth  6 each Topical Q0600  . darbepoetin (ARANESP) injection - DIALYSIS  150 mcg Intravenous Q Sat-HD  . furosemide  80 mg Intravenous Q12H  . guaiFENesin  600 mg Oral BID  . ipratropium-albuterol  3 mL Nebulization BID  . multivitamin  1 tablet Oral QHS  . sevelamer carbonate  800 mg Oral TID WC       LABS:   CBC Latest Ref Rng & Units 06/12/2019 06/10/2019 06/09/2019  WBC 4.0 - 10.5 K/uL 9.4 10.2 -  Hemoglobin 13.0 - 17.0 g/dL 9.8(L) 9.5(L) -  Hematocrit 39.0 - 52.0 % 32.8(L) 33.6(L) 30.1(L)  Platelets 150 - 400 K/uL 185 190 -    CMP Latest Ref Rng &  Units 06/12/2019 06/10/2019 06/08/2019  Glucose 70 - 99 mg/dL 102(H) 109(H) 104(H)  BUN 8 - 23 mg/dL 52(H) 92(H) 131(H)  Creatinine 0.61 - 1.24 mg/dL 3.63(H) 4.54(H) 5.56(H)  Sodium 135 - 145 mmol/L 140 142 142  Potassium 3.5 - 5.1 mmol/L 3.7 4.2 3.7  Chloride 98 - 111 mmol/L 101 100 100  CO2 22 - 32 mmol/L 28 26 29   Calcium 8.9 - 10.3 mg/dL 8.4(L) 8.6(L) 8.7(L)  Total Protein 6.5 - 8.1 g/dL - - -  Total Bilirubin 0.3 - 1.2 mg/dL - - -  Alkaline Phos 38 - 126 U/L - - -  AST 15 - 41 U/L - - -  ALT 0 - 44 U/L - - -    Lab Results  Component Value Date   PTH 15 06/09/2019   CALCIUM 8.4 (L) 06/12/2019   CAION 1.24 05/23/2019   PHOS 7.6 (H) 06/10/2019       Component Value Date/Time   COLORURINE YELLOW 06/03/2019 1022   APPEARANCEUR HAZY (A) 06/03/2019 1022   LABSPEC 1.014 06/03/2019 1022   PHURINE 5.0 06/03/2019 1022   GLUCOSEU NEGATIVE 06/03/2019 1022   HGBUR NEGATIVE 06/03/2019 1022  BILIRUBINUR NEGATIVE 06/03/2019 1022   KETONESUR NEGATIVE 06/03/2019 1022   PROTEINUR 30 (A) 06/03/2019 1022   UROBILINOGEN 1.0 05/08/2013 1738   NITRITE NEGATIVE 06/03/2019 1022   LEUKOCYTESUR NEGATIVE 06/03/2019 1022      Component Value Date/Time   PHART 7.214 (L) 06/10/2019 0915   PCO2ART 80.5 (HH) 06/10/2019 0915   PO2ART 62.8 (L) 06/10/2019 0915   HCO3 31.6 (H) 06/10/2019 0915   TCO2 34 (H) 05/23/2019 0341   ACIDBASEDEF 3.0 (H) 03/05/2019 0317   O2SAT 90.8 06/10/2019 0915       Component Value Date/Time   IRON 35 (L) 06/02/2019 0411   TIBC 214 (L) 06/02/2019 0411   FERRITIN 826 (H) 06/02/2019 0411   IRONPCTSAT 16 (L) 06/02/2019 0411       ASSESSMENT/PLAN:    85 year old male patient with past medical history significant for COPD, chronic respiratory failure on oxygen, atrial fibrillation status post cardioversion, hypertension, systolic congestive heart failure with an ejection fraction of 30%, and chronic kidney disease who presented with progressive hypoxia requiring BiPAP.   He was found to have a moderate right pleural effusion.  He was suspected to have healthcare associated pneumonia.  He has been requiring significant amounts of oxygen supplementation.  He was noted to have worsening renal function.  1.  End-stage renal disease.  New diagnosis. Status post permacath placement.  Will ask vascular surgery to see for permanent access.  For third dialysis on Wednesday, 6/24 - keep Wed treatment for now as AVF is scheduled for Thursday then will transition to TTS schedule.  He now has a spot TTS at [facility] for when stable for discharge - with 11:00 arrival first treatment.   2.  Acute on chronic hypercarbic respiratory failure.  Oxygenation is stable and improving.  Continue oxygen supplementation.  Improved after dialysis and thoracentesis.  UF as tolerated with HD.  3.  Chronic heart failure with reduced ejection fraction.  Optimize volume status with HD.  Lasix IV once again tonight and again on 6/25.  His HD regimen is changing so defer scheduled regimen for now in favor of one-time-only doses.  4.  Anemia of chronic kidney disease.  Continue darbopoietin.  5.  Metabolic bone disease.  PTH low at 15.  Continue phosphate binder.  6.  Large right pleural effusion.  Improved with thoracentesis.

## 2019-06-12 NOTE — Progress Notes (Signed)
ANTICOAGULATION CONSULT NOTE - Follow-Up Consult  Pharmacy Consult for Heparin  Indication: atrial fibrillation  Patient Measurements: Weight: 209 lb 10.5 oz (95.1 kg)  Ideal body weight: 73 kg (160 lb 15 oz) Adjusted ideal body weight: 81.8 kg (180 lb 6.8 oz)  Heparin Dosing Weight: 92kg  Vital Signs: Temp: 97.5 F (36.4 C) (06/23 0615) Temp Source: Oral (06/23 0615) BP: 105/62 (06/23 0615) Pulse Rate: 84 (06/22 2155)  Labs: Recent Labs    06/09/19 0844 06/09/19 1329 06/10/19 1104 06/12/19 0634  HGB  --   --  9.5* 9.8*  HCT  --  30.1* 33.6* 32.8*  PLT  --   --  190 185  LABPROT 17.6*  --   --   --   INR 1.5*  --   --   --   HEPARINUNFRC  --   --   --  1.02*  CREATININE  --   --  4.54* 3.63*    Estimated Creatinine Clearance: 17.8 mL/min (A) (by C-G formula based on SCr of 3.63 mg/dL (H)).   Medications:  Medications Prior to Admission  Medication Sig Dispense Refill Last Dose  . allopurinol (ZYLOPRIM) 100 MG tablet Take 100 mg by mouth daily.     Marland Kitchen amiodarone (PACERONE) 200 MG tablet Take 1 tablet (200 mg total) by mouth daily. 90 tablet 0   . apixaban (ELIQUIS) 2.5 MG TABS tablet Take 1 tablet (2.5 mg total) by mouth 2 (two) times daily. 60 tablet 0   . bisoprolol (ZEBETA) 5 MG tablet Take 5 mg by mouth daily.     . budesonide (PULMICORT) 0.25 MG/2ML nebulizer solution Take 2 mLs (0.25 mg total) by nebulization 2 (two) times daily. 60 mL 0   . ferrous sulfate 325 (65 FE) MG tablet Take 325 mg by mouth daily.      . furosemide (LASIX) 40 MG tablet Take 1 tablet (40 mg total) by mouth 2 (two) times daily. 60 tablet 0   . ipratropium-albuterol (DUONEB) 0.5-2.5 (3) MG/3ML SOLN Use twice a day scheduled and every 4 hours as needed for shortness of breath and wheezing (Patient not taking: Reported on 05/19/2019) 360 mL 0   . Multiple Vitamin (MULTIVITAMIN WITH MINERALS) TABS tablet Take 1 tablet by mouth daily.     . OXYGEN Inhale 4 L into the lungs continuous.       Scheduled:  . allopurinol  100 mg Oral Daily  . amiodarone  200 mg Oral Daily  . budesonide  0.25 mg Nebulization BID  . Chlorhexidine Gluconate Cloth  6 each Topical Q0600  . darbepoetin (ARANESP) injection - DIALYSIS  150 mcg Intravenous Q Sat-HD  . furosemide  80 mg Intravenous Q12H  . guaiFENesin  600 mg Oral BID  . ipratropium-albuterol  3 mL Nebulization BID  . multivitamin  1 tablet Oral QHS  . sevelamer carbonate  800 mg Oral TID WC    Assessment: 85 YOM with afib on apixaban PTA (last dose was given 06/07/19).  He is noted with ESRD and plans are for AVF placement on 6/25. Pharmacy consulted to bridge with Heparin   aPTT this morning is SUBtherapeutic (aPTT 50, goal of 66-102). Heparin level this morning is falsely elevated in the setting of recent Apixaban use (HL 1.02). Will monitor aPTTs for now and daily HL/aPTT until correlated.   Goal of Therapy:  Heparin level 0.3-0.7 units/ml aPTT 66-102 seconds Monitor platelets by anticoagulation protocol: Yes   Plan:  - Increase Heparin to 1300  units/hr (13 ml/hr) - Will continue to monitor for any signs/symptoms of bleeding and will follow up with aPTT in 8 hours   Thank you for allowing pharmacy to be a part of this patient's care.  Alycia Rossetti, PharmD, BCPS Clinical Pharmacist Clinical phone for 06/12/2019: [phone] 06/12/2019 8:39 AM   **Pharmacist phone directory can now be found on [email or web address] ([email or web address]).  Listed under [place].

## 2019-06-12 NOTE — Progress Notes (Signed)
ANTICOAGULATION CONSULT NOTE - Follow-Up Consult  Pharmacy Consult for Heparin  Indication: atrial fibrillation  Patient Measurements: Weight: 209 lb 10.5 oz (95.1 kg)  Ideal body weight: 73 kg (160 lb 15 oz) Adjusted ideal body weight: 81.8 kg (180 lb 6.8 oz)  Heparin Dosing Weight: 92kg  Vital Signs: Temp: 98 F (36.7 C) (06/23 1534) Temp Source: Oral (06/23 1534) BP: 107/65 (06/23 1534) Pulse Rate: 104 (06/23 1534)  Labs: Recent Labs    06/10/19 1104 06/12/19 0634 06/12/19 0651 06/12/19 1643  HGB 9.5* 9.8*  --   --   HCT 33.6* 32.8*  --   --   PLT 190 185  --   --   APTT  --   --  50* 35  HEPARINUNFRC  --  1.02*  --   --   CREATININE 4.54* 3.63*  --   --     Estimated Creatinine Clearance: 17.8 mL/min (A) (by C-G formula based on SCr of 3.63 mg/dL (H)).   Medications:  Medications Prior to Admission  Medication Sig Dispense Refill Last Dose  . allopurinol (ZYLOPRIM) 100 MG tablet Take 100 mg by mouth daily.     Marland Kitchen amiodarone (PACERONE) 200 MG tablet Take 1 tablet (200 mg total) by mouth daily. 90 tablet 0   . apixaban (ELIQUIS) 2.5 MG TABS tablet Take 1 tablet (2.5 mg total) by mouth 2 (two) times daily. 60 tablet 0   . bisoprolol (ZEBETA) 5 MG tablet Take 5 mg by mouth daily.     . budesonide (PULMICORT) 0.25 MG/2ML nebulizer solution Take 2 mLs (0.25 mg total) by nebulization 2 (two) times daily. 60 mL 0   . ferrous sulfate 325 (65 FE) MG tablet Take 325 mg by mouth daily.      . furosemide (LASIX) 40 MG tablet Take 1 tablet (40 mg total) by mouth 2 (two) times daily. 60 tablet 0   . ipratropium-albuterol (DUONEB) 0.5-2.5 (3) MG/3ML SOLN Use twice a day scheduled and every 4 hours as needed for shortness of breath and wheezing (Patient not taking: Reported on 05/19/2019) 360 mL 0   . Multiple Vitamin (MULTIVITAMIN WITH MINERALS) TABS tablet Take 1 tablet by mouth daily.     . OXYGEN Inhale 4 L into the lungs continuous.      Scheduled:  . allopurinol  100 mg Oral  Daily  . amiodarone  200 mg Oral Daily  . budesonide  0.25 mg Nebulization BID  . Chlorhexidine Gluconate Cloth  6 each Topical Q0600  . darbepoetin (ARANESP) injection - DIALYSIS  150 mcg Intravenous Q Sat-HD  . furosemide  80 mg Intravenous Once  . [START ON 06/14/2019] furosemide  80 mg Intravenous Once  . guaiFENesin  600 mg Oral BID  . ipratropium-albuterol  3 mL Nebulization BID  . multivitamin  1 tablet Oral QHS  . sevelamer carbonate  800 mg Oral TID WC    Assessment: 85 YOM with afib on apixaban PTA (last dose was given 06/07/19).  He is noted with ESRD and plans are for AVF placement on 6/25. Pharmacy consulted to bridge with Heparin   aPTT this morning was SUBtherapeutic (aPTT 50, goal of 66-102) now down to 35s after rate adjustment. No bleeding or IV issues noted.   Will monitor aPTTs for now and daily HL/aPTT until correlated.   Goal of Therapy:  Heparin level 0.3-0.7 units/ml aPTT 66-102 seconds Monitor platelets by anticoagulation protocol: Yes   Plan:  - Increase Heparin to 1600 units/hr (16  ml/hr) - Will continue to monitor for any signs/symptoms of bleeding and will follow up with aPTT in 8 hours   Thank you for allowing pharmacy to be a part of this patient's care.  Erin Hearing PharmD., BCPS Clinical Pharmacist 06/12/2019 6:31 PM

## 2019-06-13 LAB — BASIC METABOLIC PANEL
Anion gap: 11 (ref 5–15)
BUN: 59 mg/dL — ABNORMAL HIGH (ref 8–23)
CO2: 28 mmol/L (ref 22–32)
Calcium: 8.3 mg/dL — ABNORMAL LOW (ref 8.9–10.3)
Chloride: 98 mmol/L (ref 98–111)
Creatinine, Ser: 3.89 mg/dL — ABNORMAL HIGH (ref 0.61–1.24)
GFR calc Af Amer: 16 mL/min — ABNORMAL LOW (ref >60)
GFR calc non Af Amer: 13 mL/min — ABNORMAL LOW (ref >60)
Glucose, Bld: 114 mg/dL — ABNORMAL HIGH (ref 70–99)
Potassium: 3.1 mmol/L — ABNORMAL LOW (ref 3.5–5.1)
Sodium: 137 mmol/L (ref 135–145)

## 2019-06-13 LAB — CBC
HCT: 31.3 % — ABNORMAL LOW (ref 39.0–52.0)
Hemoglobin: 9.5 g/dL — ABNORMAL LOW (ref 13.0–17.0)
MCH: 32 pg (ref 26.0–34.0)
MCHC: 30.4 g/dL (ref 30.0–36.0)
MCV: 105.4 fL — ABNORMAL HIGH (ref 80.0–100.0)
Platelets: 185 10*3/uL (ref 150–400)
RBC: 2.97 MIL/uL — ABNORMAL LOW (ref 4.22–5.81)
RDW: 18.1 % — ABNORMAL HIGH (ref 11.5–15.5)
WBC: 10 10*3/uL (ref 4.0–10.5)
nRBC: 0 % (ref 0.0–0.2)

## 2019-06-13 LAB — MAGNESIUM: Magnesium: 1.9 mg/dL (ref 1.7–2.4)

## 2019-06-13 LAB — HEPARIN LEVEL (UNFRACTIONATED): Heparin Unfractionated: 0.94 IU/mL — ABNORMAL HIGH (ref 0.30–0.70)

## 2019-06-13 LAB — APTT
aPTT: 74 seconds — ABNORMAL HIGH (ref 24–36)
aPTT: 97 seconds — ABNORMAL HIGH (ref 24–36)

## 2019-06-13 MED ORDER — BUDESONIDE 0.25 MG/2ML IN SUSP
0.5000 mg | Freq: Two times a day (BID) | RESPIRATORY_TRACT | Status: DC
  Administered 2019-06-13 – 2019-06-15 (×3): 0.5 mg via RESPIRATORY_TRACT
  Filled 2019-06-13 (×4): qty 4

## 2019-06-13 MED ORDER — CEFAZOLIN SODIUM-DEXTROSE 2-4 GM/100ML-% IV SOLN
2.0000 g | INTRAVENOUS | Status: AC
  Administered 2019-06-14: 2 g via INTRAVENOUS
  Filled 2019-06-13: qty 100

## 2019-06-13 MED ORDER — HEPARIN SODIUM (PORCINE) 1000 UNIT/ML IJ SOLN
INTRAMUSCULAR | Status: AC
  Administered 2019-06-13: 3800 [IU]
  Filled 2019-06-13: qty 4

## 2019-06-13 MED ORDER — DOXYCYCLINE HYCLATE 100 MG PO TABS
100.0000 mg | ORAL_TABLET | Freq: Two times a day (BID) | ORAL | Status: DC
  Administered 2019-06-13 – 2019-06-15 (×5): 100 mg via ORAL
  Filled 2019-06-13 (×5): qty 1

## 2019-06-13 MED ORDER — PREDNISONE 20 MG PO TABS
50.0000 mg | ORAL_TABLET | Freq: Every day | ORAL | Status: DC
  Administered 2019-06-13 – 2019-06-15 (×3): 50 mg via ORAL
  Filled 2019-06-13 (×3): qty 1

## 2019-06-13 NOTE — Progress Notes (Signed)
[facility]    NEPHROLOGY PROGRESS NOTE  SUBJECTIVE:  He had 2.5 kg UF with HD earlier today - just finished.  He states that he is on 4 liters of oxygen at home and has been since around March 2020.  I spent several minutes talking with the patient and his wife via speakerphone.  He continues on heparin gtt in anticipation of AVF placement tomorrow.  He has a spot at [facility] on TTS schedule at 12:15 - they chose this location based on the 2nd shift time and the TTS schedule but the patient had not spoken with his wife about it so was confused as to why that clinic was chosen.  She is concerned about his strength and I asked that she discuss that with his team.   Review of systems:    Reports shortness of breath improving Denies cp Denies n/v Edema improving   OBJECTIVE:  Vitals:   06/13/19 1721 06/13/19 1814  BP: 107/78 (!) 158/126  Pulse: 85 (!) 103  Resp: (!) 21 18  Temp: 98.4 F (36.9 C) 97.9 F (36.6 C)  SpO2: 97% 96%    Intake/Output Summary (Last 24 hours) at 06/13/2019 1941 Last data filed at 06/13/2019 1721 Gross per 24 hour  Intake -  Output 2900 ml  Net -2900 ml      General:  AAOx3 NAD HEENT: MMM [place] AT anicteric sclera Neck:  No JVD, no adenopathy CV:  Tachycardia  Lungs: Improved aeration on the right, left base reduced  Abd:  abd SNT/ND GU:  Bladder non-palpable Extremities: no pitting lower extremity edema  Skin:  No skin rash Access: right chest tunneled catheter   MEDICATIONS:  . allopurinol  100 mg Oral Daily  . amiodarone  200 mg Oral Daily  . budesonide  0.5 mg Nebulization BID  . Chlorhexidine Gluconate Cloth  6 each Topical Q0600  . Chlorhexidine Gluconate Cloth  6 each Topical Q0600  . darbepoetin (ARANESP) injection - DIALYSIS  150 mcg Intravenous Q Sat-HD  . doxycycline  100 mg Oral Q12H  . [START ON 06/14/2019] furosemide  80 mg Intravenous Once  . guaiFENesin  600 mg Oral BID  . ipratropium-albuterol  3 mL Nebulization BID   . multivitamin  1 tablet Oral QHS  . predniSONE  50 mg Oral Q breakfast  . sevelamer carbonate  800 mg Oral TID WC       LABS:   CBC Latest Ref Rng & Units 06/13/2019 06/12/2019 06/10/2019  WBC 4.0 - 10.5 K/uL 10.0 9.4 10.2  Hemoglobin 13.0 - 17.0 g/dL 9.5(L) 9.8(L) 9.5(L)  Hematocrit 39.0 - 52.0 % 31.3(L) 32.8(L) 33.6(L)  Platelets 150 - 400 K/uL 185 185 190    CMP Latest Ref Rng & Units 06/13/2019 06/12/2019 06/10/2019  Glucose 70 - 99 mg/dL 114(H) 102(H) 109(H)  BUN 8 - 23 mg/dL 59(H) 52(H) 92(H)  Creatinine 0.61 - 1.24 mg/dL 3.89(H) 3.63(H) 4.54(H)  Sodium 135 - 145 mmol/L 137 140 142  Potassium 3.5 - 5.1 mmol/L 3.1(L) 3.7 4.2  Chloride 98 - 111 mmol/L 98 101 100  CO2 22 - 32 mmol/L 28 28 26   Calcium 8.9 - 10.3 mg/dL 8.3(L) 8.4(L) 8.6(L)  Total Protein 6.5 - 8.1 g/dL - - -  Total Bilirubin 0.3 - 1.2 mg/dL - - -  Alkaline Phos 38 - 126 U/L - - -  AST 15 - 41 U/L - - -  ALT 0 - 44 U/L - - -    Lab Results  Component Value Date   PTH 15 06/09/2019   CALCIUM 8.3 (L) 06/13/2019   CAION 1.24 05/23/2019   PHOS 7.6 (H) 06/10/2019       Component Value Date/Time   COLORURINE YELLOW 06/03/2019 1022   APPEARANCEUR HAZY (A) 06/03/2019 1022   LABSPEC 1.014 06/03/2019 1022   PHURINE 5.0 06/03/2019 1022   GLUCOSEU NEGATIVE 06/03/2019 1022   HGBUR NEGATIVE 06/03/2019 1022   BILIRUBINUR NEGATIVE 06/03/2019 1022   KETONESUR NEGATIVE 06/03/2019 1022   PROTEINUR 30 (A) 06/03/2019 1022   UROBILINOGEN 1.0 05/08/2013 1738   NITRITE NEGATIVE 06/03/2019 1022   LEUKOCYTESUR NEGATIVE 06/03/2019 1022      Component Value Date/Time   PHART 7.214 (L) 06/10/2019 0915   PCO2ART 80.5 (HH) 06/10/2019 0915   PO2ART 62.8 (L) 06/10/2019 0915   HCO3 31.6 (H) 06/10/2019 0915   TCO2 34 (H) 05/23/2019 0341   ACIDBASEDEF 3.0 (H) 03/05/2019 0317   O2SAT 90.8 06/10/2019 0915       Component Value Date/Time   IRON 35 (L) 06/02/2019 0411   TIBC 214 (L) 06/02/2019 0411   FERRITIN 826 (H)  06/02/2019 0411   IRONPCTSAT 16 (L) 06/02/2019 0411       ASSESSMENT/PLAN:    85 year old male patient with past medical history significant for COPD, chronic respiratory failure on oxygen, atrial fibrillation status post cardioversion, hypertension, systolic congestive heart failure with an ejection fraction of 30%, and chronic kidney disease who presented with progressive hypoxia requiring BiPAP.  He was found to have a moderate right pleural effusion.  He was suspected to have healthcare associated pneumonia.  He has been requiring significant amounts of oxygen supplementation.  He was noted to have worsening renal function.  1.  End-stage renal disease.  New diagnosis.  - Status post permacath placement.   - s/p HD today.  Will then transition to TTS schedule.  - AVF is scheduled for Thursday  - He has an outpatient spot TTS at [facility] for when stable for discharge - with 11:10 arrival for his first treatment if he starts Tues or Thursday and would need to come on Friday to clinic to sign paperwork if he starts on a Saturday.  2.  Acute on chronic hypoxemic hypercarbic respiratory failure.  Multifactorial with COPD and overload and possible PNA.  Oxygenation is stable but still with high oxygen requirement.  Continue oxygen supplementation.  Improved after dialysis and thoracentesis.  UF as tolerated with HD.  Noted addition of steroids per primary team.  3.  Chronic heart failure with reduced ejection fraction.  Optimize volume status with HD.  Lasix for 6/25 once (his HD regimen is changing hence the one-time doses)  4.  Anemia of chronic kidney disease.  Continue darbopoietin.  5.  Metabolic bone disease.  Hyperphos.  PTH low at 15.  Continue phosphate binder.  6.  Large right pleural effusion.  Improved with thoracentesis.  7. Afib - anticoagulation per primary team

## 2019-06-13 NOTE — Progress Notes (Signed)
ANTICOAGULATION CONSULT NOTE - Follow-Up Consult  Pharmacy Consult for Heparin  Indication: atrial fibrillation  Patient Measurements: Weight: 204 lb 2.3 oz (92.6 kg)  Ideal body weight: 73 kg (160 lb 15 oz) Adjusted ideal body weight: 80.8 kg (178 lb 3.5 oz)  Heparin Dosing Weight: 92kg  Vital Signs: Temp: 97.8 F (36.6 C) (06/24 2048) Temp Source: Oral (06/24 2048) BP: 106/59 (06/24 2052) Pulse Rate: 96 (06/24 2052)  Labs: Recent Labs    06/12/19 7782  06/12/19 1643 06/13/19 0313 06/13/19 2131  HGB 9.8*  --   --  9.5*  --   HCT 32.8*  --   --  31.3*  --   PLT 185  --   --  185  --   APTT  --    < > 35 74* 97*  HEPARINUNFRC 1.02*  --   --  0.94*  --   CREATININE 3.63*  --   --  3.89*  --    < > = values in this interval not displayed.    Estimated Creatinine Clearance: 16.4 mL/min (A) (by C-G formula based on SCr of 3.89 mg/dL (H)).   Medications:  Medications Prior to Admission  Medication Sig Dispense Refill Last Dose  . allopurinol (ZYLOPRIM) 100 MG tablet Take 100 mg by mouth daily.     Marland Kitchen amiodarone (PACERONE) 200 MG tablet Take 1 tablet (200 mg total) by mouth daily. 90 tablet 0   . apixaban (ELIQUIS) 2.5 MG TABS tablet Take 1 tablet (2.5 mg total) by mouth 2 (two) times daily. 60 tablet 0   . bisoprolol (ZEBETA) 5 MG tablet Take 5 mg by mouth daily.     . budesonide (PULMICORT) 0.25 MG/2ML nebulizer solution Take 2 mLs (0.25 mg total) by nebulization 2 (two) times daily. 60 mL 0   . ferrous sulfate 325 (65 FE) MG tablet Take 325 mg by mouth daily.      . furosemide (LASIX) 40 MG tablet Take 1 tablet (40 mg total) by mouth 2 (two) times daily. 60 tablet 0   . ipratropium-albuterol (DUONEB) 0.5-2.5 (3) MG/3ML SOLN Use twice a day scheduled and every 4 hours as needed for shortness of breath and wheezing (Patient not taking: Reported on 05/19/2019) 360 mL 0   . Multiple Vitamin (MULTIVITAMIN WITH MINERALS) TABS tablet Take 1 tablet by mouth daily.     . OXYGEN  Inhale 4 L into the lungs continuous.      Scheduled:  . allopurinol  100 mg Oral Daily  . amiodarone  200 mg Oral Daily  . budesonide  0.5 mg Nebulization BID  . Chlorhexidine Gluconate Cloth  6 each Topical Q0600  . Chlorhexidine Gluconate Cloth  6 each Topical Q0600  . darbepoetin (ARANESP) injection - DIALYSIS  150 mcg Intravenous Q Sat-HD  . doxycycline  100 mg Oral Q12H  . [START ON 06/14/2019] furosemide  80 mg Intravenous Once  . guaiFENesin  600 mg Oral BID  . ipratropium-albuterol  3 mL Nebulization BID  . multivitamin  1 tablet Oral QHS  . predniSONE  50 mg Oral Q breakfast  . sevelamer carbonate  800 mg Oral TID WC    Assessment: 85 YOM with afib on apixaban PTA (last dose was given 06/07/19).  He is noted with ESRD and plans are for AVF placement on 6/25. Pharmacy consulted to bridge with Heparin   Will monitor aPTTs for now and daily HL/aPTT until correlated.   Aptt this evening at goal on heparin  1500 units/hr.  No overt bleeding or complications noted.  Goal of Therapy:  Heparin level 0.3-0.7 units/ml aPTT 66-102 seconds Monitor platelets by anticoagulation protocol: Yes   Plan:  -Cont heparin at 1500 units/hr -Daily aptt and heparin level, CBC.  Marguerite Olea, [facility] Clinical Pharmacist Phone [phone]  06/13/2019 10:41 PM

## 2019-06-13 NOTE — Progress Notes (Signed)
Pt order for BIPAP is PRN at this time. Pt respiratory status stable at this time. Pt states he is fine without machine. RT will continue to monitor.

## 2019-06-13 NOTE — Progress Notes (Signed)
PROGRESS NOTE  David Barton DDU:[id number] DOB: [birth year] DOA: 06/07/2019 PCP: Maury Dus, MD   LOS: 6 days   Patient is from: Home.  Brief Narrative / Interim history: 85 year old with past medical history of COPD/emphysema, chronic hypoxia on home 2L O2, atrial fibrillation, systolic CHF ejection fraction 30%, essential hypertension, CKD stage V, colon cancer presented to the hospital to start hemodialysis.  Recently for a moderate right-sided pleural effusion requiring IV antibiotic treatment and thoracentesis.  Initially required significant amount of oxygen during the hospitalization.  Permacath placement and then fistula placement.  IR and vascular following.  On 6/20 overnight patient developed acute shortness of breath and hypercarbia requiring BiPAP.  X-ray showed complete whiteout on the right side due to pleural effusion.  Had thoracocentesis on 6/21 with removal of 2.3 L of transudative pleural fluid.  Cultures have been negative.  Breathing improved.  Assessment & Plan: Acute hypoxemic hypercarbic respiratory failure: Multifactorial including COPD and right-sided pleural effusion and possible right lung PNA.  However, no fever or leukocytosis. -On 5 L by high flow nasal cannula. Previously discharged on 4 L but weaned to 2 L by PCP -Continue weaning oxygen -Encourage incentive spirometry and flutter valve. -Manage pleural effusion, COPD and other comorbidities as below -Continue breathing treatments  Large right-sided pleural effusion -Thora on 6/21 with removal of 2.3 L of pleural fluid  -Fluid appears to be transudative although no serum LDH or protein -Pleural fluid Gram stain and cultures negative. -CXR on 6/22 with small bilateral pleural effusion and airspace disease.  -Patient has no fever or leukocytosis.  Repeat portable chest x-ray on 6/23 with improved aeration  Acute metabolic encephalopathy secondary to CO2 narcosis-resolved -Management as above  COPD  with chronic hypoxia: Previously discharged on 4 L but weaned to 2 L by PCP -On 5 L by high flow nasal cannula today. -Start systemic steroid and doxycycline-patient does await cough as well. -Budesonide and bronchodilators.  -Encourage incentive spirometry and flutter valve. -Wean oxygen as able  Acute on chronic respiratory acidosis-likely due to the above  -Continue weaning oxygen if able  Chronic Systolic CHF, ef 23%: Appears euvolemic. 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  -Tunneled HD cath placed 6/20. -First HD on 6/22 -AVF or graft on 6/25 -Palliative care consulted-input appreciated.  Permanent Afib, on Eliquis: Rate controlled. -On PO iodarone and heparin drip -Resume anticoagulation after access.  Anemia of Chronic Disease: Stable. -Per nephrology.  Essential HTN: Normotensive. -Lasix as above.  Scheduled Meds: . allopurinol  100 mg Oral Daily  . amiodarone  200 mg Oral Daily  . budesonide  0.25 mg Nebulization BID  . Chlorhexidine Gluconate Cloth  6 each Topical Q0600  . Chlorhexidine Gluconate Cloth  6 each Topical Q0600  . darbepoetin (ARANESP) injection - DIALYSIS  150 mcg Intravenous Q Sat-HD  . [START ON 06/14/2019] furosemide  80 mg Intravenous Once  . guaiFENesin  600 mg Oral BID  . ipratropium-albuterol  3 mL Nebulization BID  . multivitamin  1 tablet Oral QHS  . sevelamer carbonate  800 mg Oral TID WC   Continuous Infusions: . [START ON 06/14/2019]  ceFAZolin (ANCEF) IV    . heparin 1,500 Units/hr (06/13/19 1051)   PRN Meds:.acetaminophen **OR** acetaminophen, camphor-menthol, ipratropium-albuterol, prochlorperazine, traZODone  DVT prophylaxis: We will start heparin for A. fib Code Status: DNR Family Communication: Pending Disposition Plan: Remains inpatient pending AVF and outpatient HD set  up  Subjective: No major events overnight of this  morning.  Reports having good night and improvement in his breathing.  However, saturating in low 90s to upper 80s on 5 L by high flow nasal cannula.  Has a wet cough.  Denies chest pain, hemoptysis, GI or GU symptoms.   Objective: Vitals:   06/12/19 2056 06/13/19 0411 06/13/19 0432 06/13/19 0800  BP: 105/67 (!) 107/56    Pulse: (!) 107 (!) 108  84  Resp: (!) 21 (!) 23  20  Temp: 98.1 F (36.7 C) 98.8 F (37.1 C)    TempSrc: Oral Oral    SpO2: 94% 94%  94%  Weight:   94.4 kg     Intake/Output Summary (Last 24 hours) at 06/13/2019 1253 Last data filed at 06/13/2019 0432 Gross per 24 hour  Intake 249.49 ml  Output 400 ml  Net -150.51 ml   Filed Weights   06/11/19 1303 06/12/19 0615 06/13/19 0432  Weight: 94.8 kg 95.1 kg 94.4 kg    Examination:  GENERAL: No acute distress.  Appears well.  HEENT: MMM.  Vision and hearing grossly intact.  NECK: Supple.  No apparent JVD. LUNGS: Low 90s to upper 80s on 5 L by HFNC.  No IWOB.  Fair air movement.  Rhonchi on the right. HEART: IR.  Normal rate.Marland Kitchen Heart sounds normal.  ABD: Bowel sounds present. Soft. Non tender.  MSK/EXT:  Moves all extremities. No apparent deformity. No edema bilaterally.  SKIN: no apparent skin lesion or wound NEURO: Awake, alert and oriented appropriately.  No gross deficit.  PSYCH: Calm. Normal affect.  Consultants:   Nephrology  Vascular surgery  Palliative care  Procedures:   Tunneled HD cath on 6/20  First HD on 6/22.  Microbiology: . COVID-19 negative. . Pleural fluid culture negative.  Antimicrobials: Anti-infectives (From admission, onward)   Start     Dose/Rate Route Frequency Ordered Stop   06/14/19 0600  ceFAZolin (ANCEF) IVPB 2g/100 mL premix     2 g 200 mL/hr over 30 Minutes Intravenous To Short Stay 06/13/19 0805 06/15/19 0600   06/11/19 0600  ceFAZolin (ANCEF) IVPB 2g/100 mL premix     2 g 200 mL/hr over 30 Minutes Intravenous To ShortStay Surgical 06/08/19 0748 06/12/19 0600    06/09/19 1044  ceFAZolin (ANCEF) 2-4 GM/100ML-% IVPB    Note to Pharmacy: Novella Rob   : cabinet override      06/09/19 1044 06/09/19 1127      Data Reviewed: I have independently reviewed following labs and imaging studies   CBC: Recent Labs  Lab 06/07/19 1531 06/08/19 0650 06/09/19 1329 06/10/19 1104 06/12/19 0634 06/13/19 0313  WBC 11.6* 8.7  --  10.2 9.4 10.0  HGB 8.6* 8.6*  --  9.5* 9.8* 9.5*  HCT 28.7* 29.0*  27.5* 30.1* 33.6* 32.8* 31.3*  MCV 107.5* 107.8*  --  112.8* 107.5* 105.4*  PLT 248 231  --  190 185 259   Basic Metabolic Panel: Recent Labs  Lab 06/06/19 1301 06/07/19 0746 06/08/19 0650 06/10/19 1104 06/12/19 0634 06/13/19 0313  NA 139 142 142 142 140 137  K 4.4 4.3 3.7 4.2 3.7 3.1*  CL 99 101 100 100 101 98  CO2 25 29 29 26 28 28   GLUCOSE 146* 108* 104* 109* 102* 114*  BUN 132* 130* 131* 92* 52* 59*  CREATININE 5.81* 5.64* 5.56* 4.54* 3.63* 3.89*  CALCIUM 8.9 8.9 8.7* 8.6* 8.4* 8.3*  MG  --   --   --  2.1 1.9 1.9  PHOS 6.9* 7.1* 7.1* 7.6*  --   --    GFR: Estimated Creatinine Clearance: 16.6 mL/min (A) (by C-G formula based on SCr of 3.89 mg/dL (H)). Liver Function Tests: Recent Labs  Lab 06/06/19 1301 06/07/19 0746 06/08/19 0650  ALBUMIN 2.6* 2.5* 2.5*   No results for input(s): LIPASE, AMYLASE in the last 168 hours. No results for input(s): AMMONIA in the last 168 hours. Coagulation Profile: Recent Labs  Lab 06/07/19 1531 06/08/19 1440 06/09/19 0844  INR 2.0* 1.6* 1.5*   Cardiac Enzymes: No results for input(s): CKTOTAL, CKMB, CKMBINDEX, TROPONINI in the last 168 hours. BNP (last 3 results) Recent Labs    05/04/19 1137  PROBNP 443.0*   HbA1C: No results for input(s): HGBA1C in the last 72 hours. CBG: Recent Labs  Lab 06/08/19 1151 06/08/19 1644 06/08/19 2129 06/09/19 0630  GLUCAP 96 117* 177* 122*   Lipid Profile: No results for input(s): CHOL, HDL, LDLCALC, TRIG, CHOLHDL, LDLDIRECT in the last 72 hours. Thyroid  Function Tests: No results for input(s): TSH, T4TOTAL, FREET4, T3FREE, THYROIDAB in the last 72 hours. Anemia Panel: No results for input(s): VITAMINB12, FOLATE, FERRITIN, TIBC, IRON, RETICCTPCT in the last 72 hours. Urine analysis:    Component Value Date/Time   COLORURINE YELLOW 06/03/2019 1022   APPEARANCEUR HAZY (A) 06/03/2019 1022   LABSPEC 1.014 06/03/2019 1022   PHURINE 5.0 06/03/2019 1022   GLUCOSEU NEGATIVE 06/03/2019 1022   HGBUR NEGATIVE 06/03/2019 1022   BILIRUBINUR NEGATIVE 06/03/2019 1022   KETONESUR NEGATIVE 06/03/2019 1022   PROTEINUR 30 (A) 06/03/2019 1022   UROBILINOGEN 1.0 05/08/2013 1738   NITRITE NEGATIVE 06/03/2019 1022   LEUKOCYTESUR NEGATIVE 06/03/2019 1022   Sepsis Labs: Invalid input(s): PROCALCITONIN, LACTICIDVEN  Recent Results (from the past 240 hour(s))  Surgical pcr screen     Status: None   Collection Time: 06/09/19  9:49 AM   Specimen: Nasal Mucosa; Nasal Swab  Result Value Ref Range Status   MRSA, PCR NEGATIVE NEGATIVE Final   Staphylococcus aureus NEGATIVE NEGATIVE Final    Comment: (NOTE) The Xpert SA Assay (FDA approved for NASAL specimens in patients 79 years of age and older), is one component of a comprehensive surveillance program. It is not intended to diagnose infection nor to guide or monitor treatment. Performed at [facility], [place] [address] [place], [place] [address]   SARS Coronavirus 2 (CEPHEID - Performed in [facility] lab), Hosp Order     Status: None   Collection Time: 06/10/19  9:35 AM   Specimen: Nasopharyngeal Swab  Result Value Ref Range Status   SARS Coronavirus 2 NEGATIVE NEGATIVE Final    Comment: (NOTE) If result is NEGATIVE SARS-CoV-2 target nucleic acids are NOT DETECTED. The SARS-CoV-2 RNA is generally detectable in upper and lower  respiratory specimens during the acute phase of infection. The lowest  concentration of SARS-CoV-2 viral copies this assay can detect is 250  copies / mL. A  negative result does not preclude SARS-CoV-2 infection  and should not be used as the sole basis for treatment or other  patient management decisions.  A negative result may occur with  improper specimen collection / handling, submission of specimen other  than nasopharyngeal swab, presence of viral mutation(s) within the  areas targeted by this assay, and inadequate number of viral copies  (<250 copies / mL). A negative result must be combined with clinical  observations, patient history, and epidemiological information. If result is POSITIVE SARS-CoV-2  target nucleic acids are DETECTED. The SARS-CoV-2 RNA is generally detectable in upper and lower  respiratory specimens dur ing the acute phase of infection.  Positive  results are indicative of active infection with SARS-CoV-2.  Clinical  correlation with patient history and other diagnostic information is  necessary to determine patient infection status.  Positive results do  not rule out bacterial infection or co-infection with other viruses. If result is PRESUMPTIVE POSTIVE SARS-CoV-2 nucleic acids MAY BE PRESENT.   A presumptive positive result was obtained on the submitted specimen  and confirmed on repeat testing.  While 2019 novel coronavirus  (SARS-CoV-2) nucleic acids may be present in the submitted sample  additional confirmatory testing may be necessary for epidemiological  and / or clinical management purposes  to differentiate between  SARS-CoV-2 and other Sarbecovirus currently known to infect humans.  If clinically indicated additional testing with an alternate test  methodology [phone]) is advised. The SARS-CoV-2 RNA is generally  detectable in upper and lower respiratory sp ecimens during the acute  phase of infection. The expected result is Negative. Fact Sheet for Patients:  StrictlyIdeas.no Fact Sheet for Healthcare Providers: BankingDealers.co.za This test is not  yet approved or cleared by the [place] FDA and has been authorized for detection and/or diagnosis of SARS-CoV-2 by FDA under an Emergency Use Authorization (EUA).  This EUA will remain in effect (meaning this test can be used) for the duration of the COVID-19 declaration under Section 564(b)(1) of the Act, 21 U.S.C. section 360bbb-3(b)(1), unless the authorization is terminated or revoked sooner. Performed at [facility], [place] [address], [place], [place] [address]   Culture, body fluid-bottle     Status: None (Preliminary result)   Collection Time: 06/10/19 12:32 PM   Specimen: Pleura  Result Value Ref Range Status   Specimen Description PLEURAL RIGHT  Final   Special Requests NONE  Final   Culture   Final    NO GROWTH 3 DAYS Performed at [facility] [address], [place], [place] [address]    Report Status PENDING  Incomplete  Gram stain     Status: None   Collection Time: 06/10/19 12:32 PM   Specimen: Pleura  Result Value Ref Range Status   Specimen Description PLEURAL RIGHT  Final   Special Requests NONE  Final   Gram Stain   Final    RARE WBC PRESENT, PREDOMINANTLY PMN NO ORGANISMS SEEN Performed at [facility], [place] [address], [place], [place] [address]    Report Status 06/10/2019 FINAL  Final     Radiology Studies: Dg Chest Port 1 View  Result Date: 06/12/2019 CLINICAL DATA:  Dyspnea. EXAM: PORTABLE CHEST 1 VIEW COMPARISON:  06/11/2019 FINDINGS: Right jugular central venous catheter tip in the right atrium unchanged. Interval improvement in right upper lobe airspace disease. Bibasilar airspace disease left greater than right unchanged. Left pleural effusion unchanged. No pneumothorax. Cardiac enlargement IMPRESSION: Improved aeration right upper lobe which may be due to clearing atelectasis or edema. Bibasilar atelectasis left greater than right unchanged. Left pleural effusion unchanged. No edema on the current study. Electronically Signed    By: Franchot Gallo M.D.   On: 06/12/2019 17:30   Krishauna Schatzman [address] [place]  If 7PM-7AM, please contact night-coverage [email or web address] Password [facility] 06/13/2019, 12:53 PM

## 2019-06-13 NOTE — H&P (View-Only) (Signed)
[facility]  Subjective  - No complaints.   Objective (!) 107/56 84 98.8 F (37.1 C) (Oral) 20 94%  Intake/Output Summary (Last 24 hours) at 06/13/2019 0828 Last data filed at 06/13/2019 0432 Gross per 24 hour  Intake 249.49 ml  Output 400 ml  Net -150.51 ml    Palpable left radial and brachial pulse Has IV in left arm  Laboratory Lab Results: Recent Labs    06/12/19 0634 06/13/19 0313  WBC 9.4 10.0  HGB 9.8* 9.5*  HCT 32.8* 31.3*  PLT 185 185   BMET Recent Labs    06/12/19 0634 06/13/19 0313  NA 140 137  K 3.7 3.1*  CL 101 98  CO2 28 28  GLUCOSE 102* 114*  BUN 52* 59*  CREATININE 3.63* 3.89*  CALCIUM 8.4* 8.3*    COAG Lab Results  Component Value Date   INR 1.5 (H) 06/09/2019   INR 1.6 (H) 06/08/2019   INR 2.0 (H) 06/07/2019   No results found for: PTT  Assessment/Planning:  AVF cancelled Monday due to emergent need for dialysis.  Plan to dialyze again today.  OR tomorrow with Dr. Donnetta Hutching for left arm AVF.  Need to restrict left arm - order was placed yesterday and still have IV in left arm.  Looks like decent cephalic vein on duplex.  NPO after midnight.  Marty Heck 06/13/2019 8:28 AM --

## 2019-06-13 NOTE — Progress Notes (Signed)
ANTICOAGULATION CONSULT NOTE - Follow-Up Consult  Pharmacy Consult for Heparin  Indication: atrial fibrillation  Patient Measurements: Weight: 209 lb 10.5 oz (95.1 kg)  Ideal body weight: 73 kg (160 lb 15 oz) Adjusted ideal body weight: 81.8 kg (180 lb 6.8 oz)  Heparin Dosing Weight: 92kg  Vital Signs: Temp: 98.1 F (36.7 C) (06/23 2056) Temp Source: Oral (06/23 2056) BP: 105/67 (06/23 2056) Pulse Rate: 107 (06/23 2056)  Labs: Recent Labs    06/10/19 1104 06/12/19 0634 06/12/19 0651 06/12/19 1643 06/13/19 0313  HGB 9.5* 9.8*  --   --  9.5*  HCT 33.6* 32.8*  --   --  31.3*  PLT 190 185  --   --  185  APTT  --   --  50* 35 74*  HEPARINUNFRC  --  1.02*  --   --  0.94*  CREATININE 4.54* 3.63*  --   --   --     Estimated Creatinine Clearance: 17.8 mL/min (A) (by C-G formula based on SCr of 3.63 mg/dL (H)).   Medications:  Medications Prior to Admission  Medication Sig Dispense Refill Last Dose  . allopurinol (ZYLOPRIM) 100 MG tablet Take 100 mg by mouth daily.     Marland Kitchen amiodarone (PACERONE) 200 MG tablet Take 1 tablet (200 mg total) by mouth daily. 90 tablet 0   . apixaban (ELIQUIS) 2.5 MG TABS tablet Take 1 tablet (2.5 mg total) by mouth 2 (two) times daily. 60 tablet 0   . bisoprolol (ZEBETA) 5 MG tablet Take 5 mg by mouth daily.     . budesonide (PULMICORT) 0.25 MG/2ML nebulizer solution Take 2 mLs (0.25 mg total) by nebulization 2 (two) times daily. 60 mL 0   . ferrous sulfate 325 (65 FE) MG tablet Take 325 mg by mouth daily.      . furosemide (LASIX) 40 MG tablet Take 1 tablet (40 mg total) by mouth 2 (two) times daily. 60 tablet 0   . ipratropium-albuterol (DUONEB) 0.5-2.5 (3) MG/3ML SOLN Use twice a day scheduled and every 4 hours as needed for shortness of breath and wheezing (Patient not taking: Reported on 05/19/2019) 360 mL 0   . Multiple Vitamin (MULTIVITAMIN WITH MINERALS) TABS tablet Take 1 tablet by mouth daily.     . OXYGEN Inhale 4 L into the lungs continuous.       Scheduled:  . allopurinol  100 mg Oral Daily  . amiodarone  200 mg Oral Daily  . budesonide  0.25 mg Nebulization BID  . Chlorhexidine Gluconate Cloth  6 each Topical Q0600  . Chlorhexidine Gluconate Cloth  6 each Topical Q0600  . darbepoetin (ARANESP) injection - DIALYSIS  150 mcg Intravenous Q Sat-HD  . [START ON 06/14/2019] furosemide  80 mg Intravenous Once  . guaiFENesin  600 mg Oral BID  . ipratropium-albuterol  3 mL Nebulization BID  . multivitamin  1 tablet Oral QHS  . sevelamer carbonate  800 mg Oral TID WC    Assessment: 85 YOM with afib on apixaban PTA (last dose was given 06/07/19).  He is noted with ESRD and plans are for AVF placement on 6/25. Pharmacy consulted to bridge with Heparin   Will monitor aPTTs for now and daily HL/aPTT until correlated.   6/24 AM update:   APTT therapeutic x 1 after rate increase  Hgb stable  Goal of Therapy:  Heparin level 0.3-0.7 units/ml aPTT 66-102 seconds Monitor platelets by anticoagulation protocol: Yes   Plan:  -Cont heparin at 1500  units/hr -1200 aPTT  Narda Bonds, PharmD, BCPS Clinical Pharmacist Phone: [phone]

## 2019-06-13 NOTE — Progress Notes (Signed)
[facility]  Subjective  - No complaints.   Objective (!) 107/56 84 98.8 F (37.1 C) (Oral) 20 94%  Intake/Output Summary (Last 24 hours) at 06/13/2019 0828 Last data filed at 06/13/2019 0432 Gross per 24 hour  Intake 249.49 ml  Output 400 ml  Net -150.51 ml    Palpable left radial and brachial pulse Has IV in left arm  Laboratory Lab Results: Recent Labs    06/12/19 0634 06/13/19 0313  WBC 9.4 10.0  HGB 9.8* 9.5*  HCT 32.8* 31.3*  PLT 185 185   BMET Recent Labs    06/12/19 0634 06/13/19 0313  NA 140 137  K 3.7 3.1*  CL 101 98  CO2 28 28  GLUCOSE 102* 114*  BUN 52* 59*  CREATININE 3.63* 3.89*  CALCIUM 8.4* 8.3*    COAG Lab Results  Component Value Date   INR 1.5 (H) 06/09/2019   INR 1.6 (H) 06/08/2019   INR 2.0 (H) 06/07/2019   No results found for: PTT  Assessment/Planning:  AVF cancelled Monday due to emergent need for dialysis.  Plan to dialyze again today.  OR tomorrow with Dr. Donnetta Hutching for left arm AVF.  Need to restrict left arm - order was placed yesterday and still have IV in left arm.  Looks like decent cephalic vein on duplex.  NPO after midnight.  Marty Heck 06/13/2019 8:28 AM --

## 2019-06-13 NOTE — Anesthesia Preprocedure Evaluation (Addendum)
Anesthesia Evaluation  Patient identified by MRN, date of birth, ID band Patient awake    Reviewed: Allergy & Precautions, H&P , NPO status , Patient's Chart, lab work & pertinent test results, reviewed documented beta blocker date and time   Airway Mallampati: II  TM Distance: >3 FB Neck ROM: Full    Dental no notable dental hx. (+) Teeth Intact, Dental Advisory Given   Pulmonary COPD,  COPD inhaler, former smoker,    Pulmonary exam normal breath sounds clear to auscultation       Cardiovascular Exercise Tolerance: Good hypertension, Pt. on medications and Pt. on home beta blockers + DOE   Rhythm:Regular Rate:Normal     Neuro/Psych negative neurological ROS  negative psych ROS   GI/Hepatic negative GI ROS, Neg liver ROS,   Endo/Other  negative endocrine ROS  Renal/GU ESRF and DialysisRenal disease  negative genitourinary   Musculoskeletal   Abdominal   Peds  Hematology  (+) Blood dyscrasia, anemia ,   Anesthesia Other Findings   Reproductive/Obstetrics negative OB ROS                            Anesthesia Physical Anesthesia Plan  ASA: III  Anesthesia Plan: MAC   Post-op Pain Management:    Induction: Intravenous  PONV Risk Score and Plan: 2 and Propofol infusion and Ondansetron  Airway Management Planned: Simple Face Mask  Additional Equipment:   Intra-op Plan:   Post-operative Plan:   Informed Consent: I have reviewed the patients History and Physical, chart, labs and discussed the procedure including the risks, benefits and alternatives for the proposed anesthesia with the patient or authorized representative who has indicated his/her understanding and acceptance.     Dental advisory given  Plan Discussed with: CRNA  Anesthesia Plan Comments:         Anesthesia Quick Evaluation

## 2019-06-14 ENCOUNTER — Encounter (HOSPITAL_COMMUNITY)
Admission: AD | Disposition: A | Discharge status: Home Health | Payer: Self-pay | Source: Ambulatory Visit | Attending: Student

## 2019-06-14 ENCOUNTER — Inpatient Hospital Stay (HOSPITAL_COMMUNITY): Payer: Medicare Other | Admitting: Certified Registered Nurse Anesthetist

## 2019-06-14 ENCOUNTER — Encounter (HOSPITAL_COMMUNITY): Payer: Self-pay | Admitting: Certified Registered"

## 2019-06-14 DIAGNOSIS — R5381 Other malaise: Secondary | ICD-10-CM

## 2019-06-14 DIAGNOSIS — N185 Chronic kidney disease, stage 5: Secondary | ICD-10-CM

## 2019-06-14 HISTORY — PX: AV FISTULA PLACEMENT: SHX1204

## 2019-06-14 LAB — HEPARIN LEVEL (UNFRACTIONATED): Heparin Unfractionated: 0.8 IU/mL — ABNORMAL HIGH (ref 0.30–0.70)

## 2019-06-14 LAB — CBC
HCT: 35.5 % — ABNORMAL LOW (ref 39.0–52.0)
HCT: 36.8 % — ABNORMAL LOW (ref 39.0–52.0)
Hemoglobin: 10.8 g/dL — ABNORMAL LOW (ref 13.0–17.0)
Hemoglobin: 10.9 g/dL — ABNORMAL LOW (ref 13.0–17.0)
MCH: 32.2 pg (ref 26.0–34.0)
MCH: 31.7 pg (ref 26.0–34.0)
MCHC: 30.4 g/dL (ref 30.0–36.0)
MCHC: 29.6 g/dL — ABNORMAL LOW (ref 30.0–36.0)
MCV: 106 fL — ABNORMAL HIGH (ref 80.0–100.0)
MCV: 107 fL — ABNORMAL HIGH (ref 80.0–100.0)
Platelets: 203 10*3/uL (ref 150–400)
Platelets: 226 10*3/uL (ref 150–400)
RBC: 3.35 MIL/uL — ABNORMAL LOW (ref 4.22–5.81)
RBC: 3.44 MIL/uL — ABNORMAL LOW (ref 4.22–5.81)
RDW: 18 % — ABNORMAL HIGH (ref 11.5–15.5)
RDW: 18 % — ABNORMAL HIGH (ref 11.5–15.5)
WBC: 5.5 10*3/uL (ref 4.0–10.5)
WBC: 11.3 10*3/uL — ABNORMAL HIGH (ref 4.0–10.5)
nRBC: 0 % (ref 0.0–0.2)
nRBC: 0 % (ref 0.0–0.2)

## 2019-06-14 LAB — BASIC METABOLIC PANEL
Anion gap: 11 (ref 5–15)
BUN: 34 mg/dL — ABNORMAL HIGH (ref 8–23)
CO2: 25 mmol/L (ref 22–32)
Calcium: 8.5 mg/dL — ABNORMAL LOW (ref 8.9–10.3)
Chloride: 102 mmol/L (ref 98–111)
Creatinine, Ser: 2.99 mg/dL — ABNORMAL HIGH (ref 0.61–1.24)
GFR calc Af Amer: 21 mL/min — ABNORMAL LOW (ref >60)
GFR calc non Af Amer: 18 mL/min — ABNORMAL LOW (ref >60)
Glucose, Bld: 155 mg/dL — ABNORMAL HIGH (ref 70–99)
Potassium: 4.3 mmol/L (ref 3.5–5.1)
Sodium: 138 mmol/L (ref 135–145)

## 2019-06-14 LAB — MAGNESIUM: Magnesium: 1.8 mg/dL (ref 1.7–2.4)

## 2019-06-14 SURGERY — ARTERIOVENOUS (AV) FISTULA CREATION
Anesthesia: Monitor Anesthesia Care | Site: Arm Lower | Laterality: Left

## 2019-06-14 SURGERY — ARTERIOVENOUS (AV) FISTULA CREATION
Anesthesia: General | Laterality: Left

## 2019-06-14 MED ORDER — 0.9 % SODIUM CHLORIDE (POUR BTL) OPTIME
TOPICAL | Status: DC | PRN
  Administered 2019-06-14: 1000 mL

## 2019-06-14 MED ORDER — FENTANYL CITRATE (PF) 100 MCG/2ML IJ SOLN: 25.0000 ug | INTRAMUSCULAR | Status: DC | PRN

## 2019-06-14 MED ORDER — LIDOCAINE-EPINEPHRINE 0.5 %-1:200000 IJ SOLN
INTRAMUSCULAR | Status: DC | PRN
  Administered 2019-06-14: 10 mL

## 2019-06-14 MED ORDER — PROMETHAZINE HCL 25 MG/ML IJ SOLN: 6.2500 mg | INTRAMUSCULAR | Status: DC | PRN

## 2019-06-14 MED ORDER — FENTANYL CITRATE (PF) 250 MCG/5ML IJ SOLN
INTRAMUSCULAR | Status: AC
  Filled 2019-06-14: qty 5

## 2019-06-14 MED ORDER — ONDANSETRON HCL 4 MG/2ML IJ SOLN
INTRAMUSCULAR | Status: AC
  Filled 2019-06-14: qty 2

## 2019-06-14 MED ORDER — SODIUM CHLORIDE 0.9 % IV SOLN
INTRAVENOUS | Status: AC
  Filled 2019-06-14: qty 1.2

## 2019-06-14 MED ORDER — BISOPROLOL FUMARATE 5 MG PO TABS
5.0000 mg | ORAL_TABLET | Freq: Every day | ORAL | Status: DC
  Administered 2019-06-14 – 2019-06-15 (×2): 5 mg via ORAL
  Filled 2019-06-14 (×2): qty 1

## 2019-06-14 MED ORDER — LIDOCAINE-EPINEPHRINE 0.5 %-1:200000 IJ SOLN
INTRAMUSCULAR | Status: AC
  Filled 2019-06-14: qty 1

## 2019-06-14 MED ORDER — EPHEDRINE 5 MG/ML INJ
INTRAVENOUS | Status: AC
  Filled 2019-06-14: qty 10

## 2019-06-14 MED ORDER — ACETAMINOPHEN 500 MG PO TABS
1000.0000 mg | ORAL_TABLET | Freq: Once | ORAL | Status: DC
  Filled 2019-06-14: qty 2

## 2019-06-14 MED ORDER — LIDOCAINE 2% (20 MG/ML) 5 ML SYRINGE
INTRAMUSCULAR | Status: AC
  Filled 2019-06-14: qty 5

## 2019-06-14 MED ORDER — PROPOFOL 500 MG/50ML IV EMUL
INTRAVENOUS | Status: DC | PRN
  Administered 2019-06-14: 35 ug/kg/min via INTRAVENOUS

## 2019-06-14 MED ORDER — APIXABAN 2.5 MG PO TABS
2.5000 mg | ORAL_TABLET | Freq: Two times a day (BID) | ORAL | Status: DC
  Administered 2019-06-14 – 2019-06-15 (×2): 2.5 mg via ORAL
  Filled 2019-06-14 (×2): qty 1

## 2019-06-14 MED ORDER — SODIUM CHLORIDE 0.9 % IV SOLN
INTRAVENOUS | Status: DC | PRN
  Administered 2019-06-14: 500 mL

## 2019-06-14 MED ORDER — LIDOCAINE 2% (20 MG/ML) 5 ML SYRINGE
INTRAMUSCULAR | Status: DC | PRN
  Administered 2019-06-14: 40 mg via INTRAVENOUS

## 2019-06-14 MED ORDER — ONDANSETRON HCL 4 MG/2ML IJ SOLN
INTRAMUSCULAR | Status: DC | PRN
  Administered 2019-06-14: 4 mg via INTRAVENOUS

## 2019-06-14 MED ORDER — SODIUM CHLORIDE 0.9 % IV SOLN
INTRAVENOUS | Status: DC | PRN
  Administered 2019-06-14: 07:00:00 via INTRAVENOUS

## 2019-06-14 MED ORDER — SUCCINYLCHOLINE CHLORIDE 200 MG/10ML IV SOSY
PREFILLED_SYRINGE | INTRAVENOUS | Status: AC
  Filled 2019-06-14: qty 10

## 2019-06-14 MED ORDER — PHENYLEPHRINE 40 MCG/ML (10ML) SYRINGE FOR IV PUSH (FOR BLOOD PRESSURE SUPPORT)
PREFILLED_SYRINGE | INTRAVENOUS | Status: AC
  Filled 2019-06-14: qty 10

## 2019-06-14 MED ORDER — FENTANYL CITRATE (PF) 250 MCG/5ML IJ SOLN
INTRAMUSCULAR | Status: DC | PRN
  Administered 2019-06-14: 50 ug via INTRAVENOUS
  Administered 2019-06-14: 25 ug via INTRAVENOUS

## 2019-06-14 SURGICAL SUPPLY — 34 items
ADH SKN CLS APL DERMABOND .7 (GAUZE/BANDAGES/DRESSINGS) ×1
ARMBAND PINK RESTRICT EXTREMIT (MISCELLANEOUS) ×4 IMPLANT
CANISTER SUCT 3000ML PPV (MISCELLANEOUS) ×2 IMPLANT
CANNULA VESSEL 3MM 2 BLNT TIP (CANNULA) ×2 IMPLANT
CLIP LIGATING EXTRA MED SLVR (CLIP) ×2 IMPLANT
CLIP LIGATING EXTRA SM BLUE (MISCELLANEOUS) ×2 IMPLANT
COVER PROBE W GEL 5X96 (DRAPES) ×2 IMPLANT
COVER WAND RF STERILE (DRAPES) ×1 IMPLANT
DECANTER SPIKE VIAL GLASS SM (MISCELLANEOUS) ×2 IMPLANT
DERMABOND ADVANCED (GAUZE/BANDAGES/DRESSINGS) ×1
DERMABOND ADVANCED .7 DNX12 (GAUZE/BANDAGES/DRESSINGS) ×1 IMPLANT
ELECT REM PT RETURN 9FT ADLT (ELECTROSURGICAL) ×2
ELECTRODE REM PT RTRN 9FT ADLT (ELECTROSURGICAL) ×1 IMPLANT
GLOVE BIO SURGEON STRL SZ 6.5 (GLOVE) ×1 IMPLANT
GLOVE BIOGEL PI IND STRL 6.5 (GLOVE) IMPLANT
GLOVE BIOGEL PI IND STRL 7.0 (GLOVE) IMPLANT
GLOVE BIOGEL PI INDICATOR 6.5 (GLOVE) ×1
GLOVE BIOGEL PI INDICATOR 7.0 (GLOVE) ×1
GLOVE ECLIPSE 7.5 STRL STRAW (GLOVE) ×1 IMPLANT
GLOVE SS BIOGEL STRL SZ 7.5 (GLOVE) ×1 IMPLANT
GLOVE SUPERSENSE BIOGEL SZ 7.5 (GLOVE) ×1
GOWN STRL REUS W/ TWL LRG LVL3 (GOWN DISPOSABLE) ×3 IMPLANT
GOWN STRL REUS W/TWL LRG LVL3 (GOWN DISPOSABLE) ×6
KIT BASIN OR (CUSTOM PROCEDURE TRAY) ×2 IMPLANT
KIT TURNOVER KIT B (KITS) ×2 IMPLANT
NS IRRIG 1000ML POUR BTL (IV SOLUTION) ×2 IMPLANT
PACK CV ACCESS (CUSTOM PROCEDURE TRAY) ×2 IMPLANT
PAD ARMBOARD 7.5X6 YLW CONV (MISCELLANEOUS) ×4 IMPLANT
SUT PROLENE 6 0 CC (SUTURE) ×2 IMPLANT
SUT VIC AB 3-0 SH 27 (SUTURE) ×2
SUT VIC AB 3-0 SH 27X BRD (SUTURE) ×1 IMPLANT
TOWEL GREEN STERILE (TOWEL DISPOSABLE) ×2 IMPLANT
UNDERPAD 30X30 (UNDERPADS AND DIAPERS) ×2 IMPLANT
WATER STERILE IRR 1000ML POUR (IV SOLUTION) ×2 IMPLANT

## 2019-06-14 NOTE — Progress Notes (Signed)
Spoke with Anesthesiologist re: stopping Heparin drip and he said to stop it when they come to pick him up this morning.

## 2019-06-14 NOTE — Progress Notes (Signed)
PT Cancellation Note  Patient Details Name: David Barton MRN: [id number] DOB: 06-May-1933   Cancelled Treatment:    Reason Eval/Treat Not Completed: Patient declined, no reason specified Pt reporting "I'm not in the mood to do physical therapy". Will follow up as schedule allows.   Leighton Ruff, PT, DPT  Acute Rehabilitation Services  Pager: [phone] Office: [phone]    Rudean Hitt 06/14/2019, 11:39 AM

## 2019-06-14 NOTE — Progress Notes (Signed)
RN came to check on pt, found pt's bleeding/ oozing from the site.  Paged MD on call. Donzetta Matters, MD called back, said to put gauze over it and MD will come assess it later.

## 2019-06-14 NOTE — Progress Notes (Signed)
[facility]    NEPHROLOGY PROGRESS NOTE  SUBJECTIVE:  S/p left radiocephalic AV fistula today with Dr. Donnetta Hutching.  Last HD on 6/24 with 2.5 kg UF.   Feels well and states procedure went well.  He states that he wants to work with OT tomorrow to try going up stairs.  Isn't quite sure he's ready to leave.  Review of systems:    Reports shortness of breath improving Denies cp Denies n/v Edema improving  Has a condom cath - not a foley   OBJECTIVE:  Vitals:   06/14/19 1553 06/14/19 1605  BP: 118/72   Pulse: (!) 123 (!) 105  Resp:  19  Temp: 98.1 F (36.7 C)   SpO2: (!) 89% 91%    Intake/Output Summary (Last 24 hours) at 06/14/2019 1806 Last data filed at 06/14/2019 1400 Gross per 24 hour  Intake 950.72 ml  Output -  Net 950.72 ml      General:  AAOx3 NAD  HEENT: MMM [place] AT anicteric sclera Neck:  No JVD, no adenopathy CV:  Tachycardia  Lungs: Improved aeration on the right, left base reduced  Abd:  abd SNT/ND GU:  Bladder non-palpable Extremities: trace lower extremity edema  Skin:  No skin rash Access: right chest tunneled catheter; LUE AVF bruit and thrill  GU condom cath    MEDICATIONS:  . acetaminophen  1,000 mg Oral Once  . allopurinol  100 mg Oral Daily  . amiodarone  200 mg Oral Daily  . apixaban  2.5 mg Oral BID  . budesonide  0.5 mg Nebulization BID  . Chlorhexidine Gluconate Cloth  6 each Topical Q0600  . Chlorhexidine Gluconate Cloth  6 each Topical Q0600  . darbepoetin (ARANESP) injection - DIALYSIS  150 mcg Intravenous Q Sat-HD  . doxycycline  100 mg Oral Q12H  . furosemide  80 mg Intravenous Once  . guaiFENesin  600 mg Oral BID  . ipratropium-albuterol  3 mL Nebulization BID  . multivitamin  1 tablet Oral QHS  . predniSONE  50 mg Oral Q breakfast  . sevelamer carbonate  800 mg Oral TID WC       LABS:   CBC Latest Ref Rng & Units 06/14/2019 06/14/2019 06/13/2019  WBC 4.0 - 10.5 K/uL 11.3(H) 5.5 10.0  Hemoglobin 13.0 - 17.0 g/dL  10.9(L) 10.8(L) 9.5(L)  Hematocrit 39.0 - 52.0 % 36.8(L) 35.5(L) 31.3(L)  Platelets 150 - 400 K/uL 226 203 185    CMP Latest Ref Rng & Units 06/14/2019 06/13/2019 06/12/2019  Glucose 70 - 99 mg/dL 155(H) 114(H) 102(H)  BUN 8 - 23 mg/dL 34(H) 59(H) 52(H)  Creatinine 0.61 - 1.24 mg/dL 2.99(H) 3.89(H) 3.63(H)  Sodium 135 - 145 mmol/L 138 137 140  Potassium 3.5 - 5.1 mmol/L 4.3 3.1(L) 3.7  Chloride 98 - 111 mmol/L 102 98 101  CO2 22 - 32 mmol/L 25 28 28   Calcium 8.9 - 10.3 mg/dL 8.5(L) 8.3(L) 8.4(L)  Total Protein 6.5 - 8.1 g/dL - - -  Total Bilirubin 0.3 - 1.2 mg/dL - - -  Alkaline Phos 38 - 126 U/L - - -  AST 15 - 41 U/L - - -  ALT 0 - 44 U/L - - -    Lab Results  Component Value Date   PTH 15 06/09/2019   CALCIUM 8.5 (L) 06/14/2019   CAION 1.24 05/23/2019   PHOS 7.6 (H) 06/10/2019       Component Value Date/Time   COLORURINE YELLOW 06/03/2019 1022   APPEARANCEUR  HAZY (A) 06/03/2019 1022   LABSPEC 1.014 06/03/2019 1022   PHURINE 5.0 06/03/2019 1022   GLUCOSEU NEGATIVE 06/03/2019 1022   HGBUR NEGATIVE 06/03/2019 1022   BILIRUBINUR NEGATIVE 06/03/2019 1022   KETONESUR NEGATIVE 06/03/2019 1022   PROTEINUR 30 (A) 06/03/2019 1022   UROBILINOGEN 1.0 05/08/2013 1738   NITRITE NEGATIVE 06/03/2019 1022   LEUKOCYTESUR NEGATIVE 06/03/2019 1022      Component Value Date/Time   PHART 7.214 (L) 06/10/2019 0915   PCO2ART 80.5 (HH) 06/10/2019 0915   PO2ART 62.8 (L) 06/10/2019 0915   HCO3 31.6 (H) 06/10/2019 0915   TCO2 34 (H) 05/23/2019 0341   ACIDBASEDEF 3.0 (H) 03/05/2019 0317   O2SAT 90.8 06/10/2019 0915       Component Value Date/Time   IRON 35 (L) 06/02/2019 0411   TIBC 214 (L) 06/02/2019 0411   FERRITIN 826 (H) 06/02/2019 0411   IRONPCTSAT 16 (L) 06/02/2019 0411       ASSESSMENT/PLAN:    85 year old male patient with past medical history significant for COPD, chronic respiratory failure on oxygen, atrial fibrillation status post cardioversion, hypertension, systolic  congestive heart failure with an ejection fraction of 30%, and chronic kidney disease who presented with progressive hypoxia requiring BiPAP.  He was found to have a moderate right pleural effusion.  He was suspected to have healthcare associated pneumonia.  He has been requiring significant amounts of oxygen supplementation.  He was noted to have worsening renal function.  1.  End-stage renal disease.  New diagnosis.  - Now s/p LUE radiocephalic AVF on 1/63.  Started HD on 6/20 via tunneled catheter - Transitioning to a TTS schedule for HD  - He has an outpatient spot TTS at [facility] for when stable for discharge - with 11:10 arrival for his first treatment if he starts Tues or Thursday and would need to come on Friday to clinic to sign paperwork if he starts on a Saturday. - If he discharges 6/26 he will need to go by [facility] on [address] to sign paperwork for outpatient HD and then start there on Sat 6/27 - If he stays tomorrow, he will dialyze here on Sat  2.  Acute on chronic hypoxemic hypercarbic respiratory failure. Multifactorial with COPD and overload and possible PNA. Improved after dialysis and thoracentesis.  UF as tolerated with HD.  Steroids per primary team.  Note chronic oxygen requirement of 4 liters at home since march per pt.   3.  Chronic heart failure with reduced ejection fraction.  Optimize volume status with HD   4.  Anemia of chronic kidney disease.  Continue darbopoietin.  5.  Metabolic bone disease.  Hyperphos.  PTH low at 15.  Continue phosphate binder for now.  6.  Large right pleural effusion.  Improved with thoracentesis.  7. Afib - on amio and anticoagulation per primary team

## 2019-06-14 NOTE — Op Note (Signed)
    OPERATIVE REPORT  DATE OF SURGERY: 06/14/2019  PATIENT: David Barton, 85 y.o. male MRN: [id number]  DOB: 1933/12/04  PRE-OPERATIVE DIAGNOSIS: End-stage renal disease  POST-OPERATIVE DIAGNOSIS:  Same  PROCEDURE: Left radiocephalic AV fistula  SURGEON:  Curt Jews, M.D.  PHYSICIAN ASSISTANT: Nurse  ANESTHESIA: With sedation  EBL: per anesthesia record  No intake/output data recorded.  BLOOD ADMINISTERED: none  DRAINS: none  SPECIMEN: none  COUNTS CORRECT:  YES  PATIENT DISPOSITION:  PACU - hemodynamically stable  PROCEDURE DETAILS: Patient was taken operating placed supine position where the area of the left arm was prepped draped in sterile fashion.  SonoSite ultrasound was used to visualize the cephalic vein which was of good caliber from the wrist through the upper arm.  The patient had a more dorsal aspect of his cephalic vein away from the radial artery.  Using local anesthesia incision was made over this vein and the vein was of good caliber.  Tributary branches were ligated with 3-0 and 4 silk ties and divided.  The vein was ligated distally and divided.  The vein was gently dilated and was of excellent caliber.  Separate incision was made over the radial artery and the vein was brought through a subcutaneous tunnel to the level of the radial artery.  The artery was occluded proximally and distally with Serafin clamp and was open to knee with Potts scissors.  The vein was cut to the appropriate length and was spatulated and sewn end-to-side to the artery with a running 6-0 Prolene suture.  Clamps were removed and excellent thrill was noted.  Wounds irrigated with saline.  Hemostasis after cautery.  Wounds were closed with 3-0 Vicryl in the subcutaneous subcuticular tissue.  Sterile dressing was applied the patient was transferred to the recovery in stable condition   Rosetta Posner, M.D., [facility] 06/14/2019 9:26 AM

## 2019-06-14 NOTE — Interval H&P Note (Signed)
History and Physical Interval Note:  06/14/2019 7:29 AM  David Barton  has presented today for surgery, with the diagnosis of END STAGE RENAL DISEASE.  The various methods of treatment have been discussed with the patient and family. After consideration of risks, benefits and other options for treatment, the patient has consented to  Procedure(s): ARTERIOVENOUS (AV) FISTULA CREATION VERSUS GRAFT (Left) as a surgical intervention.  The patient's history has been reviewed, patient examined, no change in status, stable for surgery.  I have reviewed the patient's chart and labs.  Questions were answered to the patient's satisfaction.     Curt Jews

## 2019-06-14 NOTE — Evaluation (Signed)
Occupational Therapy Evaluation Patient Details Name: David Barton MRN: [id number] DOB: 1933/01/04 Today's Date: 06/14/2019    History of Present Illness Pt adm with acute on chronic hypoxic respiratory failure and found to have rt sided effusion. PMH - copd on home O2, afib, htn, ckd, colon CA, etoh abuse   Clinical Impression   Pt admitted with above. He demonstrates the below listed deficits and will benefit from continued OT to maximize safety and independence with BADLs.  Pt presents to OT with generalized weakness, decreased activity tolerance, impaired balance.  He currently requires up to min A for ADLs and min guard assist for functional mobility.  HR to 128 with activity, and 02 sats 86-91% on 4L supplemental 02, DOE 3/4.  Pt lives with spouse, who works and will be able to provide intermittent supervision/assist at discharge. He is most concerned about his fatigue level/generalized weakness, and ability to negotiate 4 steps to get into his home.  Recommend HHOT.  Will follow acutely.       Follow Up Recommendations  Home health OT;Supervision/Assistance - 24 hour(initially  )    Equipment Recommendations  None recommended by OT    Recommendations for Other Services       Precautions / Restrictions Precautions Precautions: Fall;Other (comment) Precaution Comments: monitor SpO2 (4L-oxygen dependent baseline)      Mobility Bed Mobility               General bed mobility comments: Pt sitting up in chair   Transfers Overall transfer level: Needs assistance Equipment used: Rolling walker (2 wheeled) Transfers: Sit to/from [facility] Sit to Stand: Min guard Stand pivot transfers: Min guard       General transfer comment: min guard for safety     Balance Overall balance assessment: Needs assistance Sitting-balance support: No upper extremity supported;Feet supported Sitting balance-Leahy Scale: Fair     Standing balance support: During  functional activity Standing balance-Leahy Scale: Fair Standing balance comment: able to maintain static standing with close min gaurd assist                            ADL either performed or assessed with clinical judgement   ADL Overall ADL's : Needs assistance/impaired Eating/Feeding: Sitting;Independent   Grooming: Wash/dry hands;Wash/dry face;Oral care;Brushing hair;Min guard;Standing   Upper Body Bathing: Set up;Sitting   Lower Body Bathing: Minimal assistance;Sit to/from stand   Upper Body Dressing : Set up;Sitting   Lower Body Dressing: Minimal assistance;Sit to/from stand   Toilet Transfer: Min guard;Ambulation;Comfort height toilet;Grab bars;RW   Toileting- Water quality scientist and Hygiene: Min guard;Sit to/from stand       Functional mobility during ADLs: Min guard General ADL Comments: Pt requires seated rest breaks due to fatigue      Vision Baseline Vision/History: Wears glasses Wears Glasses: Reading only Patient Visual Report: No change from baseline Vision Assessment?: No apparent visual deficits     Perception     Praxis      Pertinent Vitals/Pain Pain Assessment: No/denies pain     Hand Dominance Right   Extremity/Trunk Assessment Upper Extremity Assessment Upper Extremity Assessment: Generalized weakness   Lower Extremity Assessment Lower Extremity Assessment: Defer to PT evaluation       Communication Communication Communication: HOH   Cognition Arousal/Alertness: Awake/alert Behavior During Therapy: WFL for tasks assessed/performed Overall Cognitive Status: Within Functional Limits for tasks assessed  General Comments  02 sats 86-91% on 4L suppplemental 02; HR 128 with activity.       Exercises     Shoulder Instructions      Home Living Family/patient expects to be discharged to:: Private residence Living Arrangements: Spouse/significant other Available Help  at Discharge: Family;Available PRN/intermittently(spouse works ) Type of Home: House Home Access: Stairs to enter Technical brewer of Steps: 4 Entrance Stairs-Rails: Right Home Layout: One level     Bathroom Shower/Tub: Occupational psychologist: Standard Bathroom Accessibility: Yes How Accessible: Accessible via walker Home Equipment: [place] - 2 wheels;Shower seat   Additional Comments: Patient has received home health PT and OT. Patient has a 2WW, straight cane, grab bars in shower, LHS, BSC  Lives With: Spouse    Prior Functioning/Environment Level of Independence: Needs assistance  Gait / Transfers Assistance Needed: Per pt he was amb without assistive device. ADL's / Homemaking Assistance Needed: Pt was performing ADL with assist from spouse when tired.   Comments: Pt reports he uses up to 4L supplemental 02 at home. Pt enjoys reading         OT Problem List: Decreased strength;Decreased activity tolerance;Impaired balance (sitting and/or standing);Decreased safety awareness;Pain;Decreased coordination;Cardiopulmonary status limiting activity      OT Treatment/Interventions: Self-care/ADL training;Therapeutic exercise;Neuromuscular education;Energy conservation;Therapeutic activities;Patient/family education;Balance training;DME and/or AE instruction    OT Goals(Current goals can be found in the care plan section) Acute Rehab OT Goals Patient Stated Goal: to be able to go up and down the steps to get into house  OT Goal Formulation: With patient Time For Goal Achievement: 06/28/19 Potential to Achieve Goals: Good ADL Goals Pt Will Perform Grooming: with supervision;standing Pt Will Perform Lower Body Bathing: with supervision;with adaptive equipment;sit to/from stand Pt Will Perform Upper Body Dressing: with supervision;sitting Pt Will Perform Lower Body Dressing: with supervision;with adaptive equipment;sit to/from stand Pt Will Transfer to Toilet: with  supervision;ambulating;regular height toilet;bedside commode;grab bars Pt Will Perform Toileting - Clothing Manipulation and hygiene: with supervision;sit to/from stand Pt/caregiver will Perform Home Exercise Program: Increased strength;Both right and left upper extremity;With theraband;With written HEP provided;Independently  OT Frequency: Min 2X/week   Barriers to D/C:            Co-evaluation PT/OT/SLP Co-Evaluation/Treatment: Yes Reason for Co-Treatment: For patient/therapist safety;To address functional/ADL transfers   OT goals addressed during session: ADL's and self-care      AM-PAC OT "6 Clicks" Daily Activity     Outcome Measure Help from another person eating meals?: None Help from another person taking care of personal grooming?: A Little Help from another person toileting, which includes using toliet, bedpan, or urinal?: A Little Help from another person bathing (including washing, rinsing, drying)?: A Little Help from another person to put on and taking off regular upper body clothing?: A Little Help from another person to put on and taking off regular lower body clothing?: A Little 6 Click Score: 19   End of Session Equipment Utilized During Treatment: Oxygen Nurse Communication: Mobility status  Activity Tolerance: Patient tolerated treatment well Patient left: in chair;with call bell/phone within reach  OT Visit Diagnosis: Muscle weakness (generalized) (M62.81)                Time: 4235-3614 OT Time Calculation (min): 26 min Charges:  OT General Charges $OT Visit: 1 Visit OT Evaluation $OT Eval Moderate Complexity: 1 Mod  Lucille Passy, OTR/L Acute Rehabilitation Services Pager [phone] Office [phone]   Lucille Passy M 06/14/2019, 2:43 PM

## 2019-06-14 NOTE — Care Management Important Message (Signed)
Important Message  Patient Details  Name: David Barton MRN: [id number] Date of Birth: 03-Aug-1933   Medicare Important Message Given:  Yes     Shelda Altes 06/14/2019, 1:31 PM

## 2019-06-14 NOTE — Transfer of Care (Signed)
Immediate Anesthesia Transfer of Care Note  Patient: David Barton  Procedure(s) Performed: Creation of Left arm arteriovenous fistula (Left Arm Lower)  Patient Location: PACU  Anesthesia Type:MAC  Level of Consciousness: awake, alert , oriented and patient cooperative  Airway & Oxygen Therapy: Patient Spontanous Breathing and non-rebreather face mask  Post-op Assessment: Report given to RN, Post -op Vital signs reviewed and stable and Patient moving all extremities  Post vital signs: Reviewed and stable  Last Vitals:  Vitals Value Taken Time  BP 130/79 06/14/19 0907  Temp 36.6 C 06/14/19 0907  Pulse 101 06/14/19 0912  Resp 19 06/14/19 0912  SpO2 96 % 06/14/19 0912  Vitals shown include unvalidated device data.  Last Pain:  Vitals:   06/14/19 0907  TempSrc:   PainSc: (P) 0-No pain      Patients Stated Pain Goal: 0 (58/30/94 0768)  Complications: No apparent anesthesia complications

## 2019-06-14 NOTE — Progress Notes (Signed)
Pt BIPAP order is PRN and pt is not in need of at this time. Pt respiratory status is stable at this time on HFNC [place] 4 Lpm, SpO2 94% RR 15. Pt resting comfortably. RT will continue to monitor.

## 2019-06-14 NOTE — Progress Notes (Signed)
06/14/19 1708  PT Visit Information  Assistance Needed +1  PT/OT/SLP Co-Evaluation/Treatment Yes  Reason for Co-Treatment For patient/therapist safety;To address functional/ADL transfers  PT goals addressed during session Mobility/safety with mobility;Balance;Proper use of DME  History of Present Illness Pt admitted with acute on chronic hypoxic respiratory failure and found to have rt sided effusion. Pt is s/p thoracentesis on 6/21. Pt also with ESRD and is s/p L AV fistula creastion, and tunneled dialysis catheter placement.  PMH - copd on home O2, afib, htn, ckd, colon CA, etoh abuse  Precautions  Precautions Fall;Other (comment)  Precaution Comments monitor SpO2 (4L-oxygen dependent baseline)  Restrictions  Weight Bearing Restrictions No  Home Living  Family/patient expects to be discharged to: Private residence  Living Arrangements Spouse/significant other  Available Help at Discharge Family;Available PRN/intermittently (spouse works )  Type of Barrister's clerk to enter  [employer] of Steps 4  Entrance Stairs-Rails Right  Home Layout One level  Bathroom Shower/Tub Walk-in Cytogeneticist Yes  Home Equipment [place] - 2 wheels;Shower seat  Additional Comments Patient has received home health PT and OT. Patient has a 2WW, straight cane, grab bars in shower, LHS, BSC   Lives With Spouse  Prior Function  Level of Independence Needs assistance  Gait / Transfers Assistance Needed Per pt he was amb without assistive device.  ADL's / Homemaking Assistance Needed Pt was performing ADL with assist from spouse when tired.  Comments Pt reports he uses up to 4L supplemental 02 at home. Pt enjoys reading   Communication  Communication HOH  Pain Assessment  Pain Assessment No/denies pain  Cognition  Arousal/Alertness Awake/alert  Behavior During Therapy WFL for tasks assessed/performed  Overall Cognitive Status Within  Functional Limits for tasks assessed  Upper Extremity Assessment  Upper Extremity Assessment Defer to OT evaluation  Lower Extremity Assessment  Lower Extremity Assessment Generalized weakness  Cervical / Trunk Assessment  Cervical / Trunk Assessment Normal  Bed Mobility  General bed mobility comments Pt sitting up in chair   Transfers  Overall transfer level Needs assistance  Equipment used Rolling walker (2 wheeled)  Transfers Sit to/from Stand  Sit to Stand Min guard  General transfer comment min guard for safety. Cues for hand placement.   Ambulation/Gait  Ambulation/Gait assistance Min guard;+2 safety/equipment (line management)  Gait Distance (Feet) 120 Feet  Assistive device Rolling walker (2 wheeled)  Gait Pattern/deviations Step-through pattern;Decreased stride length;Trunk flexed  General Gait Details Slow, cautious gait. Mild instability noted, and required min guard A for steadying. Cues for proximity to device.   Balance  Overall balance assessment Needs assistance  Sitting-balance support No upper extremity supported;Feet supported  Sitting balance-Leahy Scale Fair  Standing balance support During functional activity  Standing balance-Leahy Scale Fair  Standing balance comment able to maintain static standing with close min gaurd assist   General Comments  General comments (skin integrity, edema, etc.) 02 sats 86-91% on 4L suppplemental 02; HR 128 with activity.     PT - End of Session  Equipment Utilized During Treatment Oxygen  Activity Tolerance Patient tolerated treatment well  Patient left in chair;with call bell/phone within reach  Nurse Communication Mobility status  PT Assessment  PT Recommendation/Assessment Patient needs continued PT services  PT Visit Diagnosis Unsteadiness on feet (R26.81);Other abnormalities of gait and mobility (R26.89);Muscle weakness (generalized) (M62.81)  PT Problem List Decreased strength;Decreased mobility;Cardiopulmonary  status limiting activity;Decreased knowledge of use of DME;Decreased balance;Decreased  activity tolerance;Decreased knowledge of precautions  Barriers to Discharge Inaccessible home environment  Barriers to Discharge Comments steps to enter  PT Plan  PT Frequency (ACUTE ONLY) Min 3X/week  PT Treatment/Interventions (ACUTE ONLY) DME instruction;Gait training;Stair training;Functional mobility training;Therapeutic activities;Therapeutic exercise;Balance training;Patient/family education  AM-PAC PT "6 Clicks" Mobility Outcome Measure (Version 2)  Help needed turning from your back to your side while in a flat bed without using bedrails? 3  Help needed moving from lying on your back to sitting on the side of a flat bed without using bedrails? 3  Help needed moving to and from a bed to a chair (including a wheelchair)? 3  Help needed standing up from a chair using your arms (e.g., wheelchair or bedside chair)? 3  Help needed to walk in hospital room? 3  Help needed climbing 3-5 steps with a railing?  2  6 Click Score 17  Consider Recommendation of Discharge To: Home with [facility]  PT Recommendation  Follow Up Recommendations Home health PT;Supervision/Assistance - 24 hour (initially)  PT equipment None recommended by PT  Individuals Consulted  Consulted and Agree with Results and Recommendations Patient  Acute Rehab PT Goals  Patient Stated Goal to be able to go up and down the steps to get into house   PT Goal Formulation With patient  Time For Goal Achievement 06/28/19  Potential to Achieve Goals Good  PT Time Calculation  PT Start Time (ACUTE ONLY) 1400  PT Stop Time (ACUTE ONLY) 1426  PT Time Calculation (min) (ACUTE ONLY) 26 min  PT General Charges  $$ ACUTE PT VISIT 1 Visit  PT Evaluation  $PT Eval Moderate Complexity 1 Mod  Written Expression  Dominant Hand Right   Pt admitted secondary to problem above with deficits above. Pt presenting with weakness and decreased balance. Requiring  min guard A for gait with RW this session. Pt reports wife is available to assist PRN at d/c. He is most concerned about his fatigue level/generalized weakness, and ability to negotiate 4 steps to get into his home. Feel pt would benefit from HHPT. Will continue to follow acutely to maximize functional mobility independence and safety.   Leighton Ruff, PT, DPT  Acute Rehabilitation Services  Pager: [phone] Office: [phone]

## 2019-06-14 NOTE — Anesthesia Postprocedure Evaluation (Signed)
Anesthesia Post Note  Patient: David Barton  Procedure(s) Performed: Creation of Left arm arteriovenous fistula (Left Arm Lower)     Patient location during evaluation: PACU Anesthesia Type: MAC Level of consciousness: awake and alert Pain management: pain level controlled Vital Signs Assessment: post-procedure vital signs reviewed and stable Respiratory status: spontaneous breathing, nonlabored ventilation, respiratory function stable and patient connected to nasal cannula oxygen Cardiovascular status: stable and blood pressure returned to baseline Postop Assessment: no apparent nausea or vomiting Anesthetic complications: no    Last Vitals:  Vitals:   06/14/19 0928 06/14/19 0950  BP: (!) 122/47 117/63  Pulse: 93   Resp: 15   Temp: 36.6 C   SpO2: 94%     Last Pain:  Vitals:   06/14/19 0928  TempSrc:   PainSc: 0-No pain                 Denea Cheaney,W. EDMOND

## 2019-06-14 NOTE — Progress Notes (Signed)
PROGRESS NOTE  HARI CASAUS LOV:[id number] DOB: 04-20-1933 DOA: 06/07/2019 PCP: Maury Dus, MD   LOS: 7 days   Patient is from: Home.  Brief Narrative / Interim history: 85 year old with past medical history of COPD/emphysema, chronic respiratory failure on 4 L at home, atrial fibrillation, systolic CHF ejection fraction 30%, essential hypertension, CKD stage V, colon cancer presented to the hospital to start hemodialysis.  Recently for a moderate right-sided pleural effusion requiring IV antibiotic treatment and thoracentesis.  Initially required significant amount of oxygen during the hospitalization.  Permacath placement and then fistula placement.  IR and vascular following.  On 6/20 overnight patient developed acute shortness of breath and hypercarbia requiring BiPAP.  X-ray showed complete whiteout on the right side due to pleural effusion.  Had thoracocentesis on 6/21 with removal of 2.3 L of transudative pleural fluid.  Cultures have been negative.  Breathing improved.  Patient had AVF on 06/14/2019  Assessment & Plan: Acute hypoxemic hypercarbic respiratory failure: Multifactorial including COPD and right-sided pleural effusion and possible right lung PNA.  However, no fever or leukocytosis. -On 4 L.  Continue weaning as able. -Encourage incentive spirometry and flutter valve. -Manage pleural effusion, COPD and other comorbidities as below -Continue breathing treatments -Ambulate on home level oxygen -PT/OT  Large right-sided pleural effusion -Thora on 6/21 with removal of 2.3 L of pleural fluid  -Fluid appears to be transudative although no serum LDH or protein -Pleural fluid Gram stain and cultures negative. -CXR on 6/22 with small bilateral pleural effusion and airspace disease.  -Patient has no fever or leukocytosis.   -Repeat portable chest x-ray on 6/23 with improved aeration  Acute metabolic encephalopathy secondary to CO2 narcosis-resolved -Management as above   COPD with chronic hypoxia:  -Oxygen as above. -Startedsteroid and doxycycline on 6/24 -Continue budesonide and bronchodilators.   Acute on chronic respiratory acidosis-likely due to the above  -Continue weaning oxygen if able  Chronic Systolic CHF, ef 88%: Appears euvolemic. 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  -Tunneled HD cath placed 6/20. -First HD on 6/22 -Left RCF on 6/25 -Outpatient HD [facility] TTS  Permanent Afib, on Eliquis: Rate controlled. -On PO amiodarone and heparin drip -Resume Eliquis tonight  Anemia of Chronic Disease: Stable. -Per nephrology.  Essential HTN: Normotensive. -Lasix as above.  Scheduled Meds: . acetaminophen  1,000 mg Oral Once  . allopurinol  100 mg Oral Daily  . amiodarone  200 mg Oral Daily  . apixaban  2.5 mg Oral BID  . budesonide  0.5 mg Nebulization BID  . Chlorhexidine Gluconate Cloth  6 each Topical Q0600  . Chlorhexidine Gluconate Cloth  6 each Topical Q0600  . darbepoetin (ARANESP) injection - DIALYSIS  150 mcg Intravenous Q Sat-HD  . doxycycline  100 mg Oral Q12H  . furosemide  80 mg Intravenous Once  . guaiFENesin  600 mg Oral BID  . ipratropium-albuterol  3 mL Nebulization BID  . multivitamin  1 tablet Oral QHS  . predniSONE  50 mg Oral Q breakfast  . sevelamer carbonate  800 mg Oral TID WC   Continuous Infusions:  PRN Meds:.acetaminophen **OR** acetaminophen, camphor-menthol, ipratropium-albuterol, prochlorperazine, traZODone  DVT prophylaxis: We will start heparin for A. fib Code Status: DNR Family Communication: Updated patient's wife over the phone. Disposition Plan: Anticipate discharge in the next 24-hour pending PT and OT eval.   Subjective: No major events overnight of this morning.  He had  AV fistula this morning.  Denies chest pain, dyspnea, palpitation or lightheadedness.  No GI or GU symptoms.  Objective:  Vitals:   06/14/19 0922 06/14/19 0928 06/14/19 0950 06/14/19 1119  BP: (!) 143/50 (!) 122/47 117/63   Pulse: 97 93  92  Resp: 16 15  19   Temp:  97.9 F (36.6 C)    TempSrc:      SpO2: 93% 94%  92%  Weight:        Intake/Output Summary (Last 24 hours) at 06/14/2019 1316 Last data filed at 06/14/2019 0500 Gross per 24 hour  Intake 590.72 ml  Output 2500 ml  Net -1909.28 ml   Filed Weights   06/13/19 1410 06/13/19 1721 06/14/19 0538  Weight: 95.4 kg 92.6 kg 92.5 kg    Examination:  GENERAL: No acute distress.  Appears well.  Sitting on bedside chair reading book. HEENT: MMM.  Vision and hearing grossly intact.  NECK: Supple.  No apparent JVD but sitting upright. LUNGS: Upper 80s to low 90s on 4 L by [place].  No IWOB.  Fair air movement.  Mild rhonchi on the right. HEART: IR.  Normal rate. Heart sounds normal.  ABD: Bowel sounds present. Soft. Non tender.  MSK/EXT:  Moves all extremities. No apparent deformity. No edema bilaterally.  AV fistula in left upper extremity with good bruits. SKIN: no apparent skin lesion or wound NEURO: Awake, alert and oriented appropriately.  No gross deficit.  PSYCH: Calm. Normal affect.  Consultants:   Nephrology  Vascular surgery  Palliative care  Procedures:   Tunneled HD cath on 6/20  First HD on 6/22.  Left RCF on 6/25.  Microbiology: . COVID-19 negative. . Pleural fluid culture negative.  Antimicrobials: Anti-infectives (From admission, onward)   Start     Dose/Rate Route Frequency Ordered Stop   06/14/19 0600  ceFAZolin (ANCEF) IVPB 2g/100 mL premix     2 g 200 mL/hr over 30 Minutes Intravenous To Short Stay 06/13/19 0805 06/14/19 0744   06/13/19 1315  doxycycline (VIBRA-TABS) tablet 100 mg     100 mg Oral Every 12 hours 06/13/19 1300     06/11/19 0600  ceFAZolin (ANCEF) IVPB 2g/100 mL premix     2 g 200 mL/hr over 30 Minutes Intravenous To ShortStay Surgical 06/08/19 0748 06/12/19 0600   06/09/19 1044  ceFAZolin (ANCEF)  2-4 GM/100ML-% IVPB    Note to Pharmacy: Novella Rob   : cabinet override      06/09/19 1044 06/09/19 1127      Data Reviewed: I have independently reviewed following labs and imaging studies   CBC: Recent Labs  Lab 06/08/19 0650 06/09/19 1329 06/10/19 1104 06/12/19 0634 06/13/19 0313 06/14/19 0529  WBC 8.7  --  10.2 9.4 10.0 5.5  HGB 8.6*  --  9.5* 9.8* 9.5* 10.8*  HCT 29.0*  27.5* 30.1* 33.6* 32.8* 31.3* 35.5*  MCV 107.8*  --  112.8* 107.5* 105.4* 106.0*  PLT 231  --  190 185 185 601   Basic Metabolic Panel: Recent Labs  Lab 06/08/19 0650 06/10/19 1104 06/12/19 0634 06/13/19 0313 06/14/19 0529  NA 142 142 140 137 138  K 3.7 4.2 3.7 3.1* 4.3  CL 100 100 101 98 102  CO2 29 26 28 28 25   GLUCOSE 104* 109* 102* 114* 155*  BUN 131* 92* 52* 59* 34*  CREATININE 5.56* 4.54* 3.63* 3.89* 2.99*  CALCIUM 8.7* 8.6* 8.4* 8.3* 8.5*  MG  --  2.1 1.9 1.9 1.8  PHOS 7.1*  7.6*  --   --   --    GFR: Estimated Creatinine Clearance: 21.4 mL/min (A) (by C-G formula based on SCr of 2.99 mg/dL (H)). Liver Function Tests: Recent Labs  Lab 06/08/19 0650  ALBUMIN 2.5*   No results for input(s): LIPASE, AMYLASE in the last 168 hours. No results for input(s): AMMONIA in the last 168 hours. Coagulation Profile: Recent Labs  Lab 06/07/19 1531 06/08/19 1440 06/09/19 0844  INR 2.0* 1.6* 1.5*   Cardiac Enzymes: No results for input(s): CKTOTAL, CKMB, CKMBINDEX, TROPONINI in the last 168 hours. BNP (last 3 results) Recent Labs    05/04/19 1137  PROBNP 443.0*   HbA1C: No results for input(s): HGBA1C in the last 72 hours. CBG: Recent Labs  Lab 06/08/19 1151 06/08/19 1644 06/08/19 2129 06/09/19 0630  GLUCAP 96 117* 177* 122*   Lipid Profile: No results for input(s): CHOL, HDL, LDLCALC, TRIG, CHOLHDL, LDLDIRECT in the last 72 hours. Thyroid Function Tests: No results for input(s): TSH, T4TOTAL, FREET4, T3FREE, THYROIDAB in the last 72 hours. Anemia Panel: No results for  input(s): VITAMINB12, FOLATE, FERRITIN, TIBC, IRON, RETICCTPCT in the last 72 hours. Urine analysis:    Component Value Date/Time   COLORURINE YELLOW 06/03/2019 1022   APPEARANCEUR HAZY (A) 06/03/2019 1022   LABSPEC 1.014 06/03/2019 1022   PHURINE 5.0 06/03/2019 1022   GLUCOSEU NEGATIVE 06/03/2019 1022   HGBUR NEGATIVE 06/03/2019 1022   BILIRUBINUR NEGATIVE 06/03/2019 1022   KETONESUR NEGATIVE 06/03/2019 1022   PROTEINUR 30 (A) 06/03/2019 1022   UROBILINOGEN 1.0 05/08/2013 1738   NITRITE NEGATIVE 06/03/2019 1022   LEUKOCYTESUR NEGATIVE 06/03/2019 1022   Sepsis Labs: Invalid input(s): PROCALCITONIN, LACTICIDVEN  Recent Results (from the past 240 hour(s))  Surgical pcr screen     Status: None   Collection Time: 06/09/19  9:49 AM   Specimen: Nasal Mucosa; Nasal Swab  Result Value Ref Range Status   MRSA, PCR NEGATIVE NEGATIVE Final   Staphylococcus aureus NEGATIVE NEGATIVE Final    Comment: (NOTE) The Xpert SA Assay (FDA approved for NASAL specimens in patients 33 years of age and older), is one component of a comprehensive surveillance program. It is not intended to diagnose infection nor to guide or monitor treatment. Performed at [facility], [place] [address], [place], [place] [address]   SARS Coronavirus 2 (CEPHEID - Performed in [place] hospital lab), Hosp Order     Status: None   Collection Time: 06/10/19  9:35 AM   Specimen: Nasopharyngeal Swab  Result Value Ref Range Status   SARS Coronavirus 2 NEGATIVE NEGATIVE Final    Comment: (NOTE) If result is NEGATIVE SARS-CoV-2 target nucleic acids are NOT DETECTED. The SARS-CoV-2 RNA is generally detectable in upper and lower  respiratory specimens during the acute phase of infection. The lowest  concentration of SARS-CoV-2 viral copies this assay can detect is 250  copies / mL. A negative result does not preclude SARS-CoV-2 infection  and should not be used as the sole basis for treatment or other  patient  management decisions.  A negative result may occur with  improper specimen collection / handling, submission of specimen other  than nasopharyngeal swab, presence of viral mutation(s) within the  areas targeted by this assay, and inadequate number of viral copies  (<250 copies / mL). A negative result must be combined with clinical  observations, patient history, and epidemiological information. If result is POSITIVE SARS-CoV-2 target nucleic acids are DETECTED. The SARS-CoV-2 RNA is generally detectable  in upper and lower  respiratory specimens dur ing the acute phase of infection.  Positive  results are indicative of active infection with SARS-CoV-2.  Clinical  correlation with patient history and other diagnostic information is  necessary to determine patient infection status.  Positive results do  not rule out bacterial infection or co-infection with other viruses. If result is PRESUMPTIVE POSTIVE SARS-CoV-2 nucleic acids MAY BE PRESENT.   A presumptive positive result was obtained on the submitted specimen  and confirmed on repeat testing.  While 2019 novel coronavirus  (SARS-CoV-2) nucleic acids may be present in the submitted sample  additional confirmatory testing may be necessary for epidemiological  and / or clinical management purposes  to differentiate between  SARS-CoV-2 and other Sarbecovirus currently known to infect humans.  If clinically indicated additional testing with an alternate test  methodology [phone]) is advised. The SARS-CoV-2 RNA is generally  detectable in upper and lower respiratory sp ecimens during the acute  phase of infection. The expected result is Negative. Fact Sheet for Patients:  StrictlyIdeas.no Fact Sheet for Healthcare Providers: BankingDealers.co.za This test is not yet approved or cleared by the [place] FDA and has been authorized for detection and/or diagnosis of SARS-CoV-2 by FDA under  an Emergency Use Authorization (EUA).  This EUA will remain in effect (meaning this test can be used) for the duration of the COVID-19 declaration under Section 564(b)(1) of the Act, 21 U.S.C. section 360bbb-3(b)(1), unless the authorization is terminated or revoked sooner. Performed at [facility], [place] [address], [place], [place] [address]   Culture, body fluid-bottle     Status: None (Preliminary result)   Collection Time: 06/10/19 12:32 PM   Specimen: Pleura  Result Value Ref Range Status   Specimen Description PLEURAL RIGHT  Final   Special Requests NONE  Final   Culture   Final    NO GROWTH 4 DAYS Performed at [place] [address], [place], [place] [address]    Report Status PENDING  Incomplete  Gram stain     Status: None   Collection Time: 06/10/19 12:32 PM   Specimen: Pleura  Result Value Ref Range Status   Specimen Description PLEURAL RIGHT  Final   Special Requests NONE  Final   Gram Stain   Final    RARE WBC PRESENT, PREDOMINANTLY PMN NO ORGANISMS SEEN Performed at [facility], [place] [address], [place], [place] [address]    Report Status 06/10/2019 FINAL  Final     Radiology Studies: No results found. Galilee Pierron T. [place]  If 7PM-7AM, please contact night-coverage [email or web address] Password [email or web address] 06/14/2019, 1:16 PM

## 2019-06-15 ENCOUNTER — Encounter (HOSPITAL_COMMUNITY): Payer: Self-pay | Admitting: Vascular Surgery

## 2019-06-15 DIAGNOSIS — I5023 Acute on chronic systolic (congestive) heart failure: Secondary | ICD-10-CM

## 2019-06-15 LAB — CULTURE, BODY FLUID W GRAM STAIN -BOTTLE: Culture: NO GROWTH

## 2019-06-15 LAB — CBC
HCT: 34.1 % — ABNORMAL LOW (ref 39.0–52.0)
Hemoglobin: 10 g/dL — ABNORMAL LOW (ref 13.0–17.0)
MCH: 31.5 pg (ref 26.0–34.0)
MCHC: 29.3 g/dL — ABNORMAL LOW (ref 30.0–36.0)
MCV: 107.6 fL — ABNORMAL HIGH (ref 80.0–100.0)
Platelets: 210 10*3/uL (ref 150–400)
RBC: 3.17 MIL/uL — ABNORMAL LOW (ref 4.22–5.81)
RDW: 17.3 % — ABNORMAL HIGH (ref 11.5–15.5)
WBC: 9.4 10*3/uL (ref 4.0–10.5)
nRBC: 0.2 % (ref 0.0–0.2)

## 2019-06-15 MED ORDER — DOXYCYCLINE HYCLATE 100 MG PO TABS: 100.0000 mg | ORAL_TABLET | Freq: Two times a day (BID) | ORAL | 0 refills | Status: DC

## 2019-06-15 MED ORDER — RENA-VITE PO TABS: 1.0000 | ORAL_TABLET | Freq: Every day | ORAL | 0 refills | Status: AC

## 2019-06-15 MED ORDER — DARBEPOETIN ALFA 150 MCG/0.3ML IJ SOSY: 150.0000 ug | PREFILLED_SYRINGE | INTRAMUSCULAR | 0 refills | Status: DC

## 2019-06-15 MED ORDER — TRAZODONE HCL 50 MG PO TABS: 25.0000 mg | ORAL_TABLET | Freq: Every evening | ORAL | 0 refills | Status: DC | PRN

## 2019-06-15 MED ORDER — PREDNISONE 50 MG PO TABS: 50.0000 mg | ORAL_TABLET | Freq: Every day | ORAL | 0 refills | Status: DC

## 2019-06-15 MED ORDER — ANORO ELLIPTA 62.5-25 MCG/INH IN AEPB: 1.0000 | INHALATION_SPRAY | Freq: Every day | RESPIRATORY_TRACT | 1 refills | Status: DC

## 2019-06-15 MED ORDER — SEVELAMER CARBONATE 800 MG PO TABS: 800.0000 mg | ORAL_TABLET | Freq: Three times a day (TID) | ORAL | 0 refills | Status: AC

## 2019-06-15 MED ORDER — UMECLIDINIUM-VILANTEROL 62.5-25 MCG/INH IN AEPB
1.0000 | INHALATION_SPRAY | Freq: Every day | RESPIRATORY_TRACT | Status: DC
  Administered 2019-06-15: 1 via RESPIRATORY_TRACT
  Filled 2019-06-15: qty 14

## 2019-06-15 NOTE — Progress Notes (Signed)
Occupational Therapy Treatment Patient Details Name: David Barton MRN: [id number] DOB: 06/30/1933 Today's Date: 06/15/2019    History of present illness Pt admitted with acute on chronic hypoxic respiratory failure and found to have rt sided effusion. Pt is s/p thoracentesis on 6/21. Pt also with ESRD and is s/p L AV fistula creastion, and tunneled dialysis catheter placement.  PMH - copd on home O2, afib, htn, ckd, colon CA, etoh abuse   OT comments  Pt reports he is feeling better today. See "general comments" section for O2 sats. Pt supervision level for grooming at sink and for functional mobility with RW in room (VCs for safety with lines).  Therapist educating pt on energy conservation strategies. Pt anticipates discharge home today. Will continue to follow acutely.  Follow Up Recommendations  Home health OT;Supervision/Assistance - 24 hour(initially)    Equipment Recommendations  None recommended by OT    Recommendations for Other Services      Precautions / Restrictions Precautions Precautions: Fall;Other (comment) Precaution Comments: monitor SpO2 (4L-oxygen dependent baseline)       Mobility Bed Mobility               General bed mobility comments: pt up with PT in room  Transfers Overall transfer level: Needs assistance Equipment used: Rolling walker (2 wheeled) Transfers: Sit to/from Stand Sit to Stand: Supervision         General transfer comment: supervision for safety    Balance                                           ADL either performed or assessed with clinical judgement   ADL Overall ADL's : Needs assistance/impaired     Grooming: Wash/dry hands;Wash/dry face;Brushing hair;Supervision/safety;Standing                   Toilet Transfer: Consulting civil engineer Details (indicate cue type and reason): Simulated. Ambulating from sink to chair. Close supervision for safety. VCs for safety maneuvering  around lines.           General ADL Comments: Discussed LB dressing at home. Pt reports that he prefers to wear slide on shoes (such as boat shoes) so does not need to practice AE with socks. ("I can't remember the last time I wore socks.")  Discussed energy conservation strategy of seated rest breaks and prioritizing activities/ADLs and pacing himself so that he does not fatigue quickly.     Vision       Perception     Praxis      Cognition Arousal/Alertness: Awake/alert Behavior During Therapy: WFL for tasks assessed/performed Overall Cognitive Status: Within Functional Limits for tasks assessed                                          Exercises     Shoulder Instructions       General Comments O2 sats 90-95% on 5L O2 [place] (had just finished steps with PT).  89-95% on 4L O2 [place] at end of session when returned to sitting in chair.    Pertinent Vitals/ Pain       Pain Assessment: No/denies pain  Home Living  Prior Functioning/Environment              Frequency  Min 2X/week        Progress Toward Goals  OT Goals(current goals can now be found in the care plan section)  Progress towards OT goals: Progressing toward goals  Acute Rehab OT Goals Patient Stated Goal: to be able to go up and down the steps to get into house  OT Goal Formulation: With patient Time For Goal Achievement: 06/28/19 Potential to Achieve Goals: Good ADL Goals Pt Will Perform Grooming: with supervision;standing Pt Will Perform Lower Body Bathing: with supervision;with adaptive equipment;sit to/from stand Pt Will Perform Upper Body Dressing: with supervision;sitting Pt Will Perform Lower Body Dressing: with supervision;with adaptive equipment;sit to/from stand Pt Will Transfer to Toilet: with supervision;ambulating;regular height toilet;bedside commode;grab bars Pt Will Perform Toileting - Clothing Manipulation  and hygiene: with supervision;sit to/from stand Pt/caregiver will Perform Home Exercise Program: Increased strength;Both right and left upper extremity;With theraband;With written HEP provided;Independently Additional ADL Goal #1: Pt will perform ADL tasks x10 mins standing at sink with set-upA and fair balance.  Plan Discharge plan remains appropriate    Co-evaluation                 AM-PAC OT "6 Clicks" Daily Activity     Outcome Measure   Help from another person eating meals?: None Help from another person taking care of personal grooming?: None Help from another person toileting, which includes using toliet, bedpan, or urinal?: None Help from another person bathing (including washing, rinsing, drying)?: A Little Help from another person to put on and taking off regular upper body clothing?: A Little Help from another person to put on and taking off regular lower body clothing?: A Little 6 Click Score: 21    End of Session Equipment Utilized During Treatment: Gait belt;Rolling walker;Oxygen  OT Visit Diagnosis: Muscle weakness (generalized) (M62.81)   Activity Tolerance Patient tolerated treatment well   Patient Left in chair;with call bell/phone within reach   Nurse Communication Mobility status        Time: 0962-8366 OT Time Calculation (min): 10 min  Charges: OT General Charges $OT Visit: 1 Visit OT Treatments $Self Care/Home Management : 8-22 mins   Darrol Jump OTR/L [place] [phone] 06/15/2019, 12:26 PM

## 2019-06-15 NOTE — Progress Notes (Addendum)
  Progress Note    06/15/2019 7:59 AM 1 Day Post-Op  Subjective:  No pain at surgical site.  Denies signs or symptoms of steal syndrome.   Vitals:   06/14/19 2101 06/15/19 0701  BP:  (!) 94/55  Pulse: 97 75  Resp: 15   Temp:  97.7 F (36.5 C)  SpO2:  94%   Physical Exam: Lungs:  Non labored Incisions:  L wrist with 2 incisions, both c/d/i; local ecchymosis but no palpable firm hematoma; palpable thrill in distal forearm Extremities:  Symmetrical grip strength Neurologic: A&O  CBC    Component Value Date/Time   WBC 11.3 (H) 06/14/2019 1500   RBC 3.44 (L) 06/14/2019 1500   HGB 10.9 (L) 06/14/2019 1500   HGB 14.1 12/21/2018 1038   HGB 14.9 11/24/2009 0958   HCT 36.8 (L) 06/14/2019 1500   HCT 30.1 (L) 06/09/2019 1329   HCT 43.8 11/24/2009 0958   PLT 226 06/14/2019 1500   PLT 276 12/21/2018 1038   MCV 107.0 (H) 06/14/2019 1500   MCV 101 (H) 12/21/2018 1038   MCV 108.6 (H) 11/24/2009 0958   MCH 31.7 06/14/2019 1500   MCHC 29.6 (L) 06/14/2019 1500   RDW 18.0 (H) 06/14/2019 1500   RDW 12.5 12/21/2018 1038   RDW 15.4 (H) 11/24/2009 0958   LYMPHSABS 1.1 05/31/2019 0346   LYMPHSABS 2.2 11/24/2009 0958   MONOABS 1.4 (H) 05/31/2019 0346   MONOABS 0.6 11/24/2009 0958   EOSABS 0.2 05/31/2019 0346   EOSABS 0.2 11/24/2009 0958   BASOSABS 0.0 05/31/2019 0346   BASOSABS 0.0 11/24/2009 0958    BMET    Component Value Date/Time   NA 138 06/14/2019 0529   NA 139 12/21/2018 1045   K 4.3 06/14/2019 0529   CL 102 06/14/2019 0529   CO2 25 06/14/2019 0529   GLUCOSE 155 (H) 06/14/2019 0529   BUN 34 (H) 06/14/2019 0529   BUN 35 (H) 12/21/2018 1045   CREATININE 2.99 (H) 06/14/2019 0529   CALCIUM 8.5 (L) 06/14/2019 0529   GFRNONAA 18 (L) 06/14/2019 0529   GFRAA 21 (L) 06/14/2019 0529    INR    Component Value Date/Time   INR 1.5 (H) 06/09/2019 0844     Intake/Output Summary (Last 24 hours) at 06/15/2019 0759 Last data filed at 06/14/2019 1400 Gross per 24 hour   Intake 360 ml  Output -  Net 360 ml     Assessment/Plan:  85 y.o. male is s/p L radiocephalic fistula creation 1 Day Post-Op   Patent fistula with palpable thrill; no signs or symptoms of steal Local ecchymosis but no firm hematoma Office will arrange duplex in 4-6 weeks   David Ligas, PA-C [facility] [phone] 06/15/2019 7:59 AM

## 2019-06-15 NOTE — Progress Notes (Signed)
Renal Navigator notified OP HD clinic/[facility] of patient's discharge today and start in the clinic tomorrow. Renal Navigator notified Renal PA/M. Bergman to request orders to be sent for new start to [facility].  Alphonzo Cruise, [place] Renal Navigator [phone]

## 2019-06-15 NOTE — Progress Notes (Signed)
Physical Therapy Treatment Patient Details Name: David Barton MRN: [id number] DOB: 1933-04-19 Today's Date: 06/15/2019    History of Present Illness Pt adm with acute on chronic hypoxic respiratory failure and found to have rt sided effusion. PMH - copd on home O2, afib, htn, ckd, colon CA, etoh abuse    PT Comments    Pt is eager to work with therapy on stair training as he had difficulty with getting into his house after last discharge. Pt is supervision for transfers, min guard for ambulation and minA for ascent/descent of stairs. Pt reports feeling stronger since dialysis and feels confident about getting into house at discharge this afternoon. Pt is supposed to sign paperwork at the the Dialysis center on the way home. PT request that wife go in and get paperwork for him to sign so that he will have full energy reserve to get into house.     Follow Up Recommendations  Home health PT;Supervision/Assistance - 24 hour(initially)     Equipment Recommendations  None recommended by PT    Recommendations for Other Services       Precautions / Restrictions Precautions Precautions: Fall;Other (comment) Precaution Comments: monitor SpO2 (4L-oxygen dependent baseline) Restrictions Weight Bearing Restrictions: No    Mobility  Bed Mobility               General bed mobility comments: Pt sitting up in chair   Transfers Overall transfer level: Needs assistance Equipment used: Rolling walker (2 wheeled) Transfers: Sit to/from [employer] Transfers Sit to Stand: Supervision Stand pivot transfers: Supervision       General transfer comment: supervision for safety and management of O2 lines  Ambulation/Gait Ambulation/Gait assistance: Min guard Gait Distance (Feet): 3 Feet Assistive device: Rolling walker (2 wheeled) Gait Pattern/deviations: Step-through pattern;Decreased stride length;Trunk flexed Gait velocity: decreased Gait velocity interpretation: <1.8  ft/sec, indicate of risk for recurrent falls General Gait Details: slow stepping from w/c to sink to work with OT   Stairs Stairs: Yes Stairs assistance: Min assist Stair Management: One rail Right;Sideways Number of Stairs: 3(2x3) General stair comments: minA for steadying with ascent/descent of 3 steps vc for upright posture to push up on rail rather than pull up rail       Balance Overall balance assessment: Needs assistance Sitting-balance support: No upper extremity supported;Feet supported Sitting balance-Leahy Scale: Fair     Standing balance support: During functional activity Standing balance-Leahy Scale: Fair Standing balance comment: able to maintain static standing with close min gaurd assist                             Cognition Arousal/Alertness: Awake/alert Behavior During Therapy: WFL for tasks assessed/performed Overall Cognitive Status: Within Functional Limits for tasks assessed                                        Exercises      General Comments General comments (skin integrity, edema, etc.): O2 sats 90-95% on 5L O2 [place] (had just finished steps with PT).  89-95% on 4L O2 [place] at end of session when returned to sitting in chair.      Pertinent Vitals/Pain Pain Assessment: No/denies pain    Home Living                      Prior Function  PT Goals (current goals can now be found in the care plan section) Acute Rehab PT Goals Patient Stated Goal: to be able to go up and down the steps to get into house  PT Goal Formulation: With patient Time For Goal Achievement: 06/28/19 Potential to Achieve Goals: Good Progress towards PT goals: Progressing toward goals    Frequency    Min 3X/week      PT Plan Current plan remains appropriate       AM-PAC PT "6 Clicks" Mobility   Outcome Measure  Help needed turning from your back to your side while in a flat bed without using bedrails?: A  Little Help needed moving from lying on your back to sitting on the side of a flat bed without using bedrails?: A Little Help needed moving to and from a bed to a chair (including a wheelchair)?: A Little Help needed standing up from a chair using your arms (e.g., wheelchair or bedside chair)?: A Little Help needed to walk in hospital room?: A Little Help needed climbing 3-5 steps with a railing? : A Little 6 Click Score: 18    End of Session Equipment Utilized During Treatment: Oxygen Activity Tolerance: Patient tolerated treatment well Patient left: Other (comment)(with OT at sink in room) Nurse Communication: Mobility status PT Visit Diagnosis: Unsteadiness on feet (R26.81);Other abnormalities of gait and mobility (R26.89);Muscle weakness (generalized) (M62.81)     Time: 9604-5409 PT Time Calculation (min) (ACUTE ONLY): 17 min  Charges:  $Gait Training: 8-22 mins                     Kelicia Youtz B. Migdalia Dk PT, DPT Acute Rehabilitation Services Pager [phone] Office [phone]    [place] 06/15/2019, 1:19 PM

## 2019-06-15 NOTE — TOC Transition Note (Signed)
Transition of Care [facility]) - CM/SW Discharge Note   Patient Details  Name: David Barton MRN: [id number] Date of Birth: [birth year]  [facility]) CM/SW Contact:  Bethena Roys, RN Phone Number: 06/15/2019, 10:43 AM   Clinical Narrative:  Pt presented for ESRD- new to HD- Clip completed. Plan to return home today with spouse and she will provide transportation. Patient currently active with [facility]- received resumption orders. Plan will be transition home today and [facility] will reach out to patient within 24-48 hours of transition home.      Final next level of care: Home w Home Health Services Barriers to Discharge: No Barriers Identified   Patient Goals and CMS Choice Patient states their goals for this hospitalization and ongoing recovery are:: to make sure everything is coordinated with wife CMS Medicare.gov Compare Post Acute Care list provided to:: Patient Choice offered to / list presented to : Patient  Discharge Placement                       Discharge Plan and Services In-house Referral: NA Discharge Planning Services: CM Consult Post Acute Care Choice: Home Health                    HH Arranged: RN, Disease Management, PT, OT [place] Agency: [place] ([place]) Date HH Agency Contacted: 06/15/19 Time [place]: 1042 Representative spoke with at [place]: [place] ([place]) Interventions     Readmission Risk Interventions Readmission Risk Prevention Plan 03/20/2019  Transportation Screening Complete  PCP or Specialist Appt within 5-7 Days Complete  Home Care Screening Complete  Medication Review (RN CM) Complete  Some recent data might be hidden

## 2019-06-15 NOTE — Progress Notes (Signed)
[facility]    NEPHROLOGY PROGRESS NOTE  SUBJECTIVE:  Last HD on 6/24 with 2.5 kg UF.  Strict ins/outs not available.  Feels well and is hoping to go home today and planning to go to [facility] to sign papers for starting HD there tomorrow.   Review of systems:   Reports shortness of breath improved  Denies cp Denies n/v Edema improving  Has a condom cath - not a foley   OBJECTIVE:  Vitals:   06/15/19 0701 06/15/19 0908  BP: (!) 94/55   Pulse: 75 92  Resp:  19  Temp: 97.7 F (36.5 C)   SpO2: 94% 96%    Intake/Output Summary (Last 24 hours) at 06/15/2019 0925 Last data filed at 06/14/2019 1400 Gross per 24 hour  Intake 360 ml  Output -  Net 360 ml      General:  AAOx3 NAD  HEENT: MMM [place] AT anicteric sclera Neck:  No JVD, no adenopathy CV:  Tachycardia  Lungs: reduced at bases; on 4L oxygen; unlabored at rest Abd:  abd SNT/ND Extremities: trace lower extremity edema  Skin:  No skin rash Access: right chest tunneled catheter; LUE AVF bruit and thrill  GU condom cath    MEDICATIONS:  . acetaminophen  1,000 mg Oral Once  . allopurinol  100 mg Oral Daily  . amiodarone  200 mg Oral Daily  . apixaban  2.5 mg Oral BID  . bisoprolol  5 mg Oral Daily  . budesonide  0.5 mg Nebulization BID  . Chlorhexidine Gluconate Cloth  6 each Topical Q0600  . Chlorhexidine Gluconate Cloth  6 each Topical Q0600  . darbepoetin (ARANESP) injection - DIALYSIS  150 mcg Intravenous Q Sat-HD  . doxycycline  100 mg Oral Q12H  . furosemide  80 mg Intravenous Once  . guaiFENesin  600 mg Oral BID  . ipratropium-albuterol  3 mL Nebulization BID  . multivitamin  1 tablet Oral QHS  . predniSONE  50 mg Oral Q breakfast  . sevelamer carbonate  800 mg Oral TID WC  . umeclidinium-vilanterol  1 puff Inhalation Daily       LABS:   CBC Latest Ref Rng & Units 06/15/2019 06/14/2019 06/14/2019  WBC 4.0 - 10.5 K/uL 9.4 11.3(H) 5.5  Hemoglobin 13.0 - 17.0 g/dL 10.0(L) 10.9(L) 10.8(L)   Hematocrit 39.0 - 52.0 % 34.1(L) 36.8(L) 35.5(L)  Platelets 150 - 400 K/uL 210 226 203    CMP Latest Ref Rng & Units 06/15/2019 06/14/2019 06/13/2019  Glucose 70 - 99 mg/dL 114(H) 155(H) 114(H)  BUN 8 - 23 mg/dL 59(H) 34(H) 59(H)  Creatinine 0.61 - 1.24 mg/dL 4.25(H) 2.99(H) 3.89(H)  Sodium 135 - 145 mmol/L 137 138 137  Potassium 3.5 - 5.1 mmol/L 4.8 4.3 3.1(L)  Chloride 98 - 111 mmol/L 100 102 98  CO2 22 - 32 mmol/L 25 25 28   Calcium 8.9 - 10.3 mg/dL 8.8(L) 8.5(L) 8.3(L)  Total Protein 6.5 - 8.1 g/dL - - -  Total Bilirubin 0.3 - 1.2 mg/dL - - -  Alkaline Phos 38 - 126 U/L - - -  AST 15 - 41 U/L - - -  ALT 0 - 44 U/L - - -    Lab Results  Component Value Date   PTH 15 06/09/2019   CALCIUM 8.8 (L) 06/15/2019   CAION 1.24 05/23/2019   PHOS 6.2 (H) 06/15/2019       Component Value Date/Time   COLORURINE YELLOW 06/03/2019 1022   APPEARANCEUR HAZY (A) 06/03/2019  1022   LABSPEC 1.014 06/03/2019 1022   PHURINE 5.0 06/03/2019 1022   GLUCOSEU NEGATIVE 06/03/2019 1022   HGBUR NEGATIVE 06/03/2019 1022   [place] 06/03/2019 1022   [place] 06/03/2019 1022   PROTEINUR 30 (A) 06/03/2019 1022   UROBILINOGEN 1.0 05/08/2013 1738   NITRITE NEGATIVE 06/03/2019 1022   LEUKOCYTESUR NEGATIVE 06/03/2019 1022      Component Value Date/Time   PHART 7.214 (L) 06/10/2019 0915   PCO2ART 80.5 (HH) 06/10/2019 0915   PO2ART 62.8 (L) 06/10/2019 0915   HCO3 31.6 (H) 06/10/2019 0915   TCO2 34 (H) 05/23/2019 0341   ACIDBASEDEF 3.0 (H) 03/05/2019 0317   O2SAT 90.8 06/10/2019 0915       Component Value Date/Time   IRON 35 (L) 06/02/2019 0411   TIBC 214 (L) 06/02/2019 0411   FERRITIN 826 (H) 06/02/2019 0411   IRONPCTSAT 16 (L) 06/02/2019 0411       ASSESSMENT/PLAN:    85 year old male patient with past medical history significant for COPD, chronic respiratory failure on oxygen, atrial fibrillation status post cardioversion, hypertension, systolic congestive heart failure  with an ejection fraction of 30%, and chronic kidney disease who presented with progressive hypoxia requiring BiPAP.  He was found to have a moderate right pleural effusion.  He was suspected to have healthcare associated pneumonia.  He has been requiring significant amounts of oxygen supplementation.  He was noted to have worsening renal function.  1.  End-stage renal disease.  New diagnosis.  - Now s/p LUE radiocephalic AVF on 5/73.  Started HD on 6/20 via tunneled catheter - Transitioning to a TTS schedule for HD  - He has an outpatient spot TTS at [facility] - with 11:10 arrival for his first treatment if he starts Tues or Thursday and would need to come on Friday to clinic to sign paperwork if he starts on a Saturday. - He is planning for discharge today and he will need to go by [facility] on [address] to sign paperwork for outpatient HD and then start there on Sat 6/27.  If he remains overnight tonight, he will dialyze here on Sat  2.  Acute on chronic hypoxemic hypercarbic respiratory failure. Multifactorial with COPD and overload and possible PNA. Improved after dialysis and thoracentesis.  UF as tolerated with HD.  Steroids per primary team.  Note chronic oxygen requirement of 4 liters at home since march per pt.   3.  Chronic heart failure with reduced ejection fraction.  Optimize volume status with HD   4.  Anemia of chronic kidney disease.  Continue darbopoietin.  Stable   5.  Metabolic bone disease.  Hyperphos.  PTH low at 15.  Continue phosphate binder for now.  6.  Large right pleural effusion.  Improved with thoracentesis.  7. Afib - on amio and anticoagulation per primary team

## 2019-06-15 NOTE — Discharge Summary (Signed)
Physician Discharge Summary  David Barton JEH:[id number] DOB: Jul 01, 1933 DOA: 06/07/2019  PCP: Maury Dus, MD  Admit date: 06/07/2019 Discharge date: 06/15/2019  Admitted From: Home Disposition: Home  Recommendations for Outpatient Follow-up:  1. Follow up with PCP in 1-2 weeks 2. Encourage follow-up with cardiology and pulmonology in 1 to 2 weeks 3. Please follow up on the following pending results: None  Home Health: PT/OT/RN Equipment/Devices: Patient has walker and oxygen at home  Discharge Condition: Stable CODE STATUS: DNR/DNI  Hospital Course: 85 year old with COPD/emphysema, chronic respiratory failure on 4 L at home, atrial fibrillation, systolic CHF ejection fraction 30% (now 45-50%), essential hypertension, CKD-V, now progressed to ESRD, colon cancer presented to the hospital to start hemodialysis. Recently hospitalized for a moderate right-sided pleural effusion requiring IV antibiotic treatment and thoracentesis. Initially required significant amount of oxygen during that hospitalization.  Patient had HD cath placement on 06/08/2019.  Underwent his first hemodialysis on 06/09/2019.  Patient developed acute shortness of breath and hypercarbia requiring BiPAP the night of 06/09/2019.[facility] X-ray showed complete whiteout on the right side due to pleural effusion.  Had thoracocentesis on 06/10/19 with removal of 2.3 L of transudative pleural fluid.  Cultures have been negative.  Breathing improved.  He was weaned to 5 L by [facility].  Repeat chest x-ray on 6/22 with a small bilateral pleural effusion and airspace disease.  Patient did not have leukocytosis or fever.  Had another portable chest x-ray on 6/23 with improved aeration suggesting pulmonary congestion versus infiltrate. Started on systemic steroid and doxycycline for COPD exacerbation on 06/13/2019.  Respiratory status improved significantly.  He was weaned to 4 L by nasal cannula.  Evaluated by PT/OT who recommended home  health PT/OT.  Patient to pursue dialysis at [facility].  See individual problem list below for more.  Discharge Diagnoses:  Acute hypoxemic hypercarbic respiratory failure: Multifactorial including COPD and right-sided pleural effusion and possible right lung PNA.  However, no fever or leukocytosis. -On 4 L (home level) -Encourage incentive spirometry and flutter valve. -Manage pleural effusion, COPD and other comorbidities as below  Large right-sided pleural effusion -Thora on 6/21 with removal of 2.3 L transudative, culture negative pleural fluid  -CXR on 6/22 with small bilateral pleural effusion and airspace disease.  -Patient has no fever or leukocytosis.   -Repeat portable chest x-ray on 6/23 with improved aeration  Acute metabolic encephalopathy secondary to CO2 narcosis-resolved -Management as above  COPD with chronic hypoxia:  -4 L by [place]  as above. -Prednisone and doxycycline on 6/24-6/29 -Continue budesonide and PRN DuoNeb -Added Anoro Ellipta -Encouraged follow-up with pulmonology  Acute on chronic respiratory acidosis-likely due to the above  -Continue weaning oxygen if able  Chronic Systolic CHF, EF 45 to 37% (30% previously): Appears euvolemic. -Fluid management by HD -Continue home Lasix.  Make some urine. -Continue bisoprolol.  May consider adding other GDMT down the road. 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  -Tunneled HD cath placed 06/09/19. -First HD on 06/11/19 -Left RCF on 06/14/19 -Outpatient HD [facility] starting 06/16/2019  Permanent Afib, on Eliquis: Rate controlled. -On PO amiodarone and heparin drip -Resume Eliquis at 2.5 mg twice daily  Anemia of Chronic Disease: Stable. -Per nephrology.  Essential HTN: Normotensive. -Lasix and bisoprolol as above.  Discharge Instructions  Discharge Instructions    Call MD for:  difficulty breathing, headache or visual disturbances   Complete  by: As directed    Call  MD for:  persistant dizziness or light-headedness   Complete by: As directed    Call MD for:  temperature >100.4   Complete by: As directed    Diet - low sodium heart healthy   Complete by: As directed    Discharge instructions   Complete by: As directed    It has been a pleasure taking care of you! -Review your new medication list and the directions before you take. -Call and make an appointment with your primary care doctor, pulmonologist and cardiologist for hospital follow-up in 1 to 2 weeks. -Go to your dialysis and other appointments as recommended.   Increase activity slowly   Complete by: As directed      Allergies as of 06/15/2019      Reactions   Flexeril [cyclobenzaprine] Other (See Comments)   Unknown reaction       Medication List    STOP taking these medications   multivitamin with minerals Tabs tablet     TAKE these medications   allopurinol 100 MG tablet Commonly known as: ZYLOPRIM Take 100 mg by mouth daily.   amiodarone 200 MG tablet Commonly known as: PACERONE Take 1 tablet (200 mg total) by mouth daily.   Anoro Ellipta 62.5-25 MCG/INH Aepb Generic drug: umeclidinium-vilanterol Inhale 1 puff into the lungs daily.   apixaban 2.5 MG Tabs tablet Commonly known as: ELIQUIS Take 1 tablet (2.5 mg total) by mouth 2 (two) times daily.   bisoprolol 5 MG tablet Commonly known as: ZEBETA Take 5 mg by mouth daily.   budesonide 0.25 MG/2ML nebulizer solution Commonly known as: PULMICORT Take 2 mLs (0.25 mg total) by nebulization 2 (two) times daily.   Darbepoetin Alfa 150 MCG/0.3ML Sosy injection Commonly known as: ARANESP Inject 0.3 mLs (150 mcg total) into the vein every Saturday with hemodialysis. Start taking on: June 16, 2019   doxycycline 100 MG tablet Commonly known as: VIBRA-TABS Take 1 tablet (100 mg total) by mouth every 12 (twelve) hours.   ferrous sulfate 325 (65 FE) MG tablet Take 325 mg by mouth daily.    furosemide 40 MG tablet Commonly known as: LASIX Take 1 tablet (40 mg total) by mouth 2 (two) times daily.   ipratropium-albuterol 0.5-2.5 (3) MG/3ML Soln Commonly known as: DUONEB Use twice a day scheduled and every 4 hours as needed for shortness of breath and wheezing   multivitamin Tabs tablet Take 1 tablet by mouth at bedtime.   OXYGEN Inhale 4 L into the lungs continuous.   predniSONE 50 MG tablet Commonly known as: DELTASONE Take 1 tablet (50 mg total) by mouth daily with breakfast. Start on 06/16/19   sevelamer carbonate 800 MG tablet Commonly known as: RENVELA Take 1 tablet (800 mg total) by mouth 3 (three) times daily with meals.   traZODone 50 MG tablet Commonly known as: DESYREL Take 0.5-1 tablets (25-50 mg total) by mouth at bedtime as needed for sleep.      Follow-up Information    Early, Arvilla Meres, MD Follow up in 5 week(s).   Specialties: Vascular Surgery, Cardiology Contact information: [place] [address] [phone]        Maury Dus, MD. Schedule an appointment as soon as possible for a visit in 2 week(s).   Specialty: Family Medicine Contact information: [place] [address] [phone]           Consultations:  Nephrology  Vascular surgery  Procedures/Studies:  2D Echo on 05/21/2019 1. The  left ventricle has mildly reduced systolic function, with an ejection fraction of 45-50%. The cavity size was normal. There is mildly increased left ventricular wall thickness. Left ventricular diastolic Doppler parameters are indeterminate.  2. LV endocardium difficult to see (No Definity used). EF appears to be ~45-50% with hypokinesis of the inferolateral, inferoseptal and infero-apical myocardium. Consider repeat imaging with Definity to better assess.  3. Left atrial size was moderately dilated.  4. Right atrial size was moderately dilated.  5. Small pericardial effusion.  6. The pericardial  effusion is posterior to the left ventricle and circumferential.  7. Mild calcification of the mitral valve leaflet.  8. The aortic valve is tricuspid. Moderate calcification of the aortic valve. No stenosis of the aortic valve.  9. The aortic root and ascending aorta are normal in size and structure. 10. The inferior vena cava was dilated in size with <50% respiratory variability. 11. The interatrial septum was not well visualized.   HD cath placement on 06/08/2019   AV fistula on 06/14/2019  Dg Chest 1 View  Result Date: 06/02/2019 CLINICAL DATA:  Pleural effusion. Status post thoracentesis. EXAM: CHEST  1 VIEW COMPARISON:  06/01/2019 FINDINGS: Small right pleural effusion shows decrease in size since previous study. No pneumothorax visualized. Atelectasis or infiltrate is again seen in both lung bases. Mild scarring is also noted in the lower lung zones bilaterally. Stable cardiomegaly. IMPRESSION: 1. Decreased size of small right pleural effusion. No pneumothorax visualized. 2. Bibasilar atelectasis versus infiltrates, without significant change. 3. Stable cardiomegaly. Electronically Signed   By: Earle Gell M.D.   On: 06/02/2019 11:54   Dg Chest 1 View  Result Date: 05/21/2019 CLINICAL DATA:  Status post right thoracentesis EXAM: CHEST  1 VIEW COMPARISON:  05/20/2019 CT FINDINGS: Cardiac shadow is enlarged but stable. Mild atelectasis is noted in the right mid and lower lung. No pneumothorax is noted following thoracentesis. No bony abnormality is seen. IMPRESSION: No pneumothorax following right-sided thoracentesis. Mild atelectatic changes on the right. Electronically Signed   By: Inez Catalina M.D.   On: 05/21/2019 14:30   Dg Chest 2 View  Result Date: 06/04/2019 CLINICAL DATA:  Shortness of breath and weakness. EXAM: CHEST - 2 VIEW COMPARISON:  Radiographs 06/02/2019 and 06/01/2019.  CT 05/20/2019. FINDINGS: Stable cardiac enlargement. There has been no significant change in the  right-greater-than-left pleural effusions and associated bibasilar airspace opacities. There is no pneumothorax or pulmonary edema. The bones appear unchanged. IMPRESSION: Unchanged residual small pleural effusions and bibasilar airspace opacities. No new findings. Electronically Signed   By: Richardean Sale M.D.   On: 06/04/2019 11:28   Dg Chest 2 View  Result Date: 06/01/2019 CLINICAL DATA:  Shortness of breath and weakness. EXAM: CHEST - 2 VIEW COMPARISON:  May 31, 2019 FINDINGS: Effusion and opacity in the right base is stable. A small effusion with underlying atelectasis in the left base is stable. The cardiomediastinal silhouette is unchanged. No pneumothorax. No other changes. IMPRESSION: Right greater than left pleural effusions with underlying atelectasis. No other interval changes or acute abnormalities. Electronically Signed   By: Dorise Bullion III M.D   On: 06/01/2019 12:20   Dg Chest 2 View  Result Date: 05/31/2019 CLINICAL DATA:  Lung infiltrate EXAM: CHEST - 2 VIEW COMPARISON:  Six hundred eleven 2020, 05/23/2019, CT 05/20/2019 FINDINGS: Small left pleural effusion and moderate right pleural effusion without significant change. Persistent consolidation at the right middle lobe and right greater than left lung base. Enlarged cardiomediastinal silhouette with  central congestion. Aortic atherosclerosis. No pneumothorax. IMPRESSION: No significant interval change in moderate right and small left pleural effusion. Continued airspace consolidation at the right middle lobe and right greater than left lung base. Electronically Signed   By: Donavan Foil M.D.   On: 05/31/2019 19:16   Dg Chest 2 View  Result Date: 05/31/2019 CLINICAL DATA:  Hypoxia EXAM: CHEST - 2 VIEW COMPARISON:  05/23/2019 FINDINGS: Cardiac shadow is enlarged. Enlarging right-sided pleural effusion is noted with likely underlying atelectasis/infiltrate. Small left effusion is noted. No other focal abnormality is noted.  IMPRESSION: Bilateral pleural effusions right greater than left increased when compared with the prior exam particularly on the right. Electronically Signed   By: Inez Catalina M.D.   On: 05/31/2019 12:34   Ct Chest Wo Contrast  Result Date: 05/20/2019 CLINICAL DATA:  Unresolved pneumonia EXAM: CT CHEST WITHOUT CONTRAST TECHNIQUE: Multidetector CT imaging of the chest was performed following the standard protocol without IV contrast. COMPARISON:  03/19/2019 FINDINGS: Cardiovascular: Atherosclerotic calcifications of the aortic arch, great vessels, and coronary arteries are noted. No evidence of aortic aneurysm. Mediastinum/Nodes: No abnormal mediastinal adenopathy. Visualized thyroid is unremarkable. Small pericardial effusion is new. Lungs/Pleura: Left lower lobe consolidation and left pleural effusion are improved. Right lower lobe consolidation has markedly worsened. A moderate right pleural effusion has worsened. There is also patchy airspace disease throughout the right upper lobe. Subsegmental atelectasis in the right middle lobe. No pneumothorax. Centrilobular emphysema towards the lung apices. Upper Abdomen: No acute abnormality. Musculoskeletal: No vertebral compression deformity. IMPRESSION: Marked worsening consolidation in the right upper and lower lobes. Improved left lower lobe consolidation and left pleural effusion Worsening right pleural effusion which is moderate. New small pericardial effusion. Aortic Atherosclerosis (ICD10-I70.0) and Emphysema (ICD10-J43.9). Electronically Signed   By: Marybelle Killings M.D.   On: 05/20/2019 13:18   US Renal  Result Date: 06/02/2019 CLINICAL DATA:  Acute kidney injury EXAM: RENAL / URINARY TRACT ULTRASOUND COMPLETE COMPARISON:  03/04/2019 FINDINGS: Right Kidney: Renal measurements: 11.3 x 6.1 x 6.1 cm = volume: 216 mL. Increased renal echogenicity. No hydronephrosis. Upper pole hypoechoic cortical complex cyst measures 1.4 cm. Left Kidney: Renal measurements:  9.6 x 6.3 x 6.3 cm = volume: 199 mL. Increased renal echogenicity. No hydronephrosis. No acute finding by ultrasound. Bladder: Diffuse bladder wall thickening, this may be related to underdistention. IMPRESSION: Increased renal echogenicity compatible with medical renal disease. Suspect small minimally complex right renal cortical cyst measuring 1.4 cm Nonspecific bladder wall thickening Electronically Signed   By: Jerilynn Mages.  Shick M.D.   On: 06/02/2019 14:30   Ir Fluoro Guide Cv Line Right  Result Date: 06/09/2019 INDICATION: 85 year old with end-stage renal disease. Patient needs a dialysis catheter for hemodialysis. EXAM: FLUOROSCOPIC AND ULTRASOUND GUIDED PLACEMENT OF A TUNNELED DIALYSIS CATHETER Physician: Stephan Minister. Anselm Pancoast, MD MEDICATIONS: Ancef 2 g; The antibiotic was administered within an appropriate time interval prior to skin puncture. ANESTHESIA/SEDATION: Versed 0.5 mg IV; Fentanyl 25 mcg IV; Moderate Sedation Time:  15 minutes The patient was continuously monitored during the procedure by the interventional radiology nurse under my direct supervision. FLUOROSCOPY TIME:  Fluoroscopy Time: 48 seconds, 8 mGy COMPLICATIONS: None immediate. PROCEDURE: Informed consent was obtained for placement of a tunneled dialysis catheter. The patient was placed supine on the interventional table. Ultrasound confirmed a patent right internal jugular vein. Ultrasound images were obtained for documentation. The right side of the neck and chest was prepped and draped in a sterile fashion. The right neck was anesthetized  with 1% lidocaine. Maximal barrier sterile technique was utilized including caps, mask, sterile gowns, sterile gloves, sterile drape, hand hygiene and skin antiseptic. A small incision was made with #11 blade scalpel. A 21 gauge needle directed into the right internal jugular vein with ultrasound guidance. A micropuncture dilator set was placed. A 23 cm tip to cuff Palindrome catheter was selected. The skin below  the right clavicle was anesthetized and a small incision was made with an #11 blade scalpel. A subcutaneous tunnel was formed to the vein dermatotomy site. The catheter was brought through the tunnel. The vein dermatotomy site was dilated to accommodate a peel-away sheath. The catheter was placed through the peel-away sheath and directed into the central venous structures. The tip of the catheter was placed at the superior cavoatrial junction with fluoroscopy. Fluoroscopic images were obtained for documentation. Both lumens were found to aspirate and flush well. The proper amount of heparin was flushed in both lumens. The vein dermatotomy site was closed using a single layer of absorbable suture and Dermabond. Gel-Foam was placed in subcutaneous tract. The catheter was secured to the skin using Prolene suture. IMPRESSION: Successful placement of a right jugular tunneled dialysis catheter using ultrasound and fluoroscopic guidance. Electronically Signed   By: Markus Daft M.D.   On: 06/09/2019 13:29   Ir US Guide Vasc Access Right  Result Date: 06/09/2019 INDICATION: 85 year old with end-stage renal disease. Patient needs a dialysis catheter for hemodialysis. EXAM: FLUOROSCOPIC AND ULTRASOUND GUIDED PLACEMENT OF A TUNNELED DIALYSIS CATHETER Physician: Stephan Minister. Anselm Pancoast, MD MEDICATIONS: Ancef 2 g; The antibiotic was administered within an appropriate time interval prior to skin puncture. ANESTHESIA/SEDATION: Versed 0.5 mg IV; Fentanyl 25 mcg IV; Moderate Sedation Time:  15 minutes The patient was continuously monitored during the procedure by the interventional radiology nurse under my direct supervision. FLUOROSCOPY TIME:  Fluoroscopy Time: 48 seconds, 8 mGy COMPLICATIONS: None immediate. PROCEDURE: Informed consent was obtained for placement of a tunneled dialysis catheter. The patient was placed supine on the interventional table. Ultrasound confirmed a patent right internal jugular vein. Ultrasound images were  obtained for documentation. The right side of the neck and chest was prepped and draped in a sterile fashion. The right neck was anesthetized with 1% lidocaine. Maximal barrier sterile technique was utilized including caps, mask, sterile gowns, sterile gloves, sterile drape, hand hygiene and skin antiseptic. A small incision was made with #11 blade scalpel. A 21 gauge needle directed into the right internal jugular vein with ultrasound guidance. A micropuncture dilator set was placed. A 23 cm tip to cuff Palindrome catheter was selected. The skin below the right clavicle was anesthetized and a small incision was made with an #11 blade scalpel. A subcutaneous tunnel was formed to the vein dermatotomy site. The catheter was brought through the tunnel. The vein dermatotomy site was dilated to accommodate a peel-away sheath. The catheter was placed through the peel-away sheath and directed into the central venous structures. The tip of the catheter was placed at the superior cavoatrial junction with fluoroscopy. Fluoroscopic images were obtained for documentation. Both lumens were found to aspirate and flush well. The proper amount of heparin was flushed in both lumens. The vein dermatotomy site was closed using a single layer of absorbable suture and Dermabond. Gel-Foam was placed in subcutaneous tract. The catheter was secured to the skin using Prolene suture. IMPRESSION: Successful placement of a right jugular tunneled dialysis catheter using ultrasound and fluoroscopic guidance. Electronically Signed   By: Markus Daft  M.D.   On: 06/09/2019 13:29   Dg Chest Port 1 View  Result Date: 06/12/2019 CLINICAL DATA:  Dyspnea. EXAM: PORTABLE CHEST 1 VIEW COMPARISON:  06/11/2019 FINDINGS: Right jugular central venous catheter tip in the right atrium unchanged. Interval improvement in right upper lobe airspace disease. Bibasilar airspace disease left greater than right unchanged. Left pleural effusion unchanged. No  pneumothorax. Cardiac enlargement IMPRESSION: Improved aeration right upper lobe which may be due to clearing atelectasis or edema. Bibasilar atelectasis left greater than right unchanged. Left pleural effusion unchanged. No edema on the current study. Electronically Signed   By: Franchot Gallo M.D.   On: 06/12/2019 17:30   Dg Chest Port 1 View  Result Date: 06/11/2019 CLINICAL DATA:  Weakness, pleural effusion EXAM: PORTABLE CHEST 1 VIEW COMPARISON:  06/09/2019 FINDINGS: Small bilateral pleural effusions. Increased right upper lobe and right lower lobe airspace disease. Mild left lower lobe airspace disease. No pneumothorax. Stable cardiomediastinal silhouette. Right-sided dialysis catheter with the tip projecting over the cavoatrial junction. IMPRESSION: 1. Small bilateral pleural effusions with increased airspace disease throughout the right lung and mild left basilar airspace disease most concerning for multilobar pneumonia versus pulmonary edema. Electronically Signed   By: Kathreen Devoid   On: 06/11/2019 09:28   Dg Chest Port 1 View  Result Date: 06/10/2019 CLINICAL DATA:  Status post right-sided thoracentesis. EXAM: PORTABLE CHEST 1 VIEW COMPARISON:  Chest x-ray 06/09/2001 FINDINGS: Twenty evacuation of the large right pleural fluid collection. Minimal residual fluid at the lung base. Patchy right lower lobe atelectasis. No postprocedural pneumothorax. There is a small left pleural effusion. Right IJ dialysis catheter in good position, unchanged. Stable mild cardiac enlargement. IMPRESSION: 1. Near complete evacuation of the large right pleural fluid collection without postprocedural pneumothorax. 2. Small left pleural effusion. 3. Bibasilar atelectasis. Electronically Signed   By: Marijo Sanes M.D.   On: 06/10/2019 13:07   Dg Chest Port 1 View  Result Date: 06/10/2019 CLINICAL DATA:  Shortness of breath today. EXAM: PORTABLE CHEST 1 VIEW COMPARISON:  PA and lateral chest 06/04/2019. FINDINGS:  There is complete whiteout of the right chest consistent with pleural effusion and airspace disease. Small to moderate left pleural effusion and basilar atelectasis are noted. Marked cardiomegaly. Atherosclerosis is noted. Right IJ approach dialysis catheter tip projects at the superior cavoatrial junction. No pneumothorax. IMPRESSION: Complete whiteout of the right chest compatible with pleural effusion and airspace disease. The patient had only small pleural effusions on the prior exam. Small left pleural effusion and basilar atelectasis. Cardiomegaly. Electronically Signed   By: Inge Rise M.D.   On: 06/10/2019 03:33   Dg Chest Port 1 View  Result Date: 05/23/2019 CLINICAL DATA:  Right-sided pleural effusion EXAM: PORTABLE CHEST 1 VIEW COMPARISON:  05/21/2019 FINDINGS: Cardiac shadow is stable. Thickening is noted along the minor fissure on the right stable from the prior exam. Mild right basilar atelectasis is seen. No new focal abnormality is noted. No pneumothorax is seen. IMPRESSION: Stable changes on the right. No pneumothorax or sizable effusion is seen. Electronically Signed   By: Inez Catalina M.D.   On: 05/23/2019 07:54   Dg Chest Port 1 View  Result Date: 05/20/2019 CLINICAL DATA:  Pneumonia EXAM: PORTABLE CHEST 1 VIEW COMPARISON:  05/19/2019 FINDINGS: Small to moderate right pleural effusion, similar. Associated right lower lobe opacity, likely atelectasis. Trace left pleural effusion. Left lung is otherwise clear. No pneumothorax is seen. Cardiomegaly. IMPRESSION: Small to moderate right pleural effusion and trace left pleural effusion,  grossly unchanged. Associated right lower lobe opacity, likely atelectasis. Electronically Signed   By: Julian Hy M.D.   On: 05/20/2019 05:05   Dg Chest Portable 1 View  Result Date: 05/19/2019 CLINICAL DATA:  Chest pain EXAM: PORTABLE CHEST 1 VIEW COMPARISON:  05/04/2019 FINDINGS: There is increasing opacity at the right lung base with shift of  the mediastinum to the right most consistent with increasing collapse in the right lower lobe. Heterogeneous opacities throughout the right lung are stable. Increasing heterogeneous opacities at the left base. Right pleural effusion is suspected. IMPRESSION: Increasing collapse in the right lower lobe. Stable opacities throughout the right lung. Increasing pulmonary opacities at the left lung base. Small right pleural effusion is suspected. Electronically Signed   By: Marybelle Killings M.D.   On: 05/19/2019 10:13   Dg Swallowing Func-speech Pathology  Result Date: 05/21/2019 Objective Swallowing Evaluation: Type of Study: MBS-Modified Barium Swallow Study  Patient Details Name: David Barton MRN: [id number] Date of Birth: 1933/06/23 Today's Date: 05/21/2019 Time: SLP Start Time (ACUTE ONLY): 1300 -SLP Stop Time (ACUTE ONLY): 1315 SLP Time Calculation (min) (ACUTE ONLY): 15 min Past Medical History: Past Medical History: Diagnosis Date  Alcohol abuse   Colon cancer ([place])   COPD (chronic obstructive pulmonary disease) ([place])   Emphysema of lung ([place])   Former tobacco use   Hypertension   Peripheral edema   Sepsis ([place]) 10/2016  Aspiration PNA/C diff colitis Past Surgical History: Past Surgical History: Procedure Laterality Date  CARDIOVERSION N/A 03/16/2019  Procedure: CARDIOVERSION;  Surgeon: Pixie Casino, MD;  Location: [facility] ENDOSCOPY;  Service: Cardiovascular;  Laterality: N/A;  CARDIOVERSION N/A 03/23/2019  Procedure: CARDIOVERSION;  Surgeon: Lelon Perla, MD;  Location: [facility] ENDOSCOPY;  Service: Cardiovascular;  Laterality: N/A;  CATARACT EXTRACTION    COLON RESECTION    COLONOSCOPY    IMPLANTATION BONE ANCHORED HEARING AID Left 2016  MINOR HEMORRHOIDECTOMY  1995  TEE WITHOUT CARDIOVERSION N/A 03/16/2019  Procedure: TRANSESOPHAGEAL ECHOCARDIOGRAM (TEE);  Surgeon: Pixie Casino, MD;  Location: [facility] ENDOSCOPY;  Service: Cardiovascular;  Laterality: N/A;  TONSILLECTOMY   HPI: Patient is an 85 y.o.  male with PMH: COPD: oxygen dependent on 4L at home, atrial fibrillation on Xarelto, diastolic heart failure, cardiomyopathy, chronic kidney disease, colon cancer, ETOH abuse, who also had a prolonged hospitalization from March to April for septic shock. He arrived at the hospital on 5/30 with mid-sternal chest pain radiating to the neck and 2 week h/o worsening breathing. CXR revealed RLL collapse, mediastinal shift to the right, stable opacities in right lung, increasing opacities in LL base.  Repeat CXR revealed small to moderate right pleural effusion and trace left pleural effusion.  Subjective: pleasant, no c/o swallow problems Assessment / Plan / Recommendation CHL IP CLINICAL IMPRESSIONS 05/21/2019 Clinical Impression Patient presents with an oropharyngeal swallow that is WFL-WNL without aspiration or penetration even when taking large volume sips of thin liquids via cup and straw sips. He exhbited a trace amount of vallecular residuals, but these fully cleared with spontaneous swallowing.  SLP Visit Diagnosis Dysphagia, unspecified (R13.10) Attention and concentration deficit following -- Frontal lobe and executive function deficit following -- Impact on safety and function No limitations   CHL IP TREATMENT RECOMMENDATION 05/21/2019 Treatment Recommendations No treatment recommended at this time   Prognosis 05/21/2019 Prognosis for Safe Diet Advancement Good Barriers to Reach Goals -- Barriers/Prognosis Comment -- CHL IP DIET RECOMMENDATION 05/21/2019 SLP Diet Recommendations Regular solids;Thin liquid Liquid Administration via Cup;Straw Medication Administration Whole  meds with liquid Compensations -- Postural Changes --   No flowsheet data found.  CHL IP FOLLOW UP RECOMMENDATIONS 05/21/2019 Follow up Recommendations None   No flowsheet data found.     CHL IP ORAL PHASE 05/21/2019 Oral Phase WFL Oral - Pudding Teaspoon -- Oral - Pudding Cup -- Oral - Honey Teaspoon -- Oral - Honey Cup -- Oral - Nectar Teaspoon -- Oral  - Nectar Cup -- Oral - Nectar Straw -- Oral - Thin Teaspoon -- Oral - Thin Cup -- Oral - Thin Straw -- Oral - Puree -- Oral - Mech Soft -- Oral - Regular -- Oral - Multi-Consistency -- Oral - Pill -- Oral Phase - Comment --  CHL IP PHARYNGEAL PHASE 05/21/2019 Pharyngeal Phase WFL Pharyngeal- Pudding Teaspoon -- Pharyngeal -- Pharyngeal- Pudding Cup -- Pharyngeal -- Pharyngeal- Honey Teaspoon -- Pharyngeal -- Pharyngeal- Honey Cup -- Pharyngeal -- Pharyngeal- Nectar Teaspoon -- Pharyngeal -- Pharyngeal- Nectar Cup -- Pharyngeal -- Pharyngeal- Nectar Straw -- Pharyngeal -- Pharyngeal- Thin Teaspoon -- Pharyngeal -- Pharyngeal- Thin Cup -- Pharyngeal -- Pharyngeal- Thin Straw -- Pharyngeal -- Pharyngeal- Puree -- Pharyngeal -- Pharyngeal- Mechanical Soft -- Pharyngeal -- Pharyngeal- Regular -- Pharyngeal -- Pharyngeal- Multi-consistency -- Pharyngeal -- Pharyngeal- Pill -- Pharyngeal -- Pharyngeal Comment --  CHL IP CERVICAL ESOPHAGEAL PHASE 05/21/2019 Cervical Esophageal Phase WFL Pudding Teaspoon -- Pudding Cup -- Honey Teaspoon -- Honey Cup -- Nectar Teaspoon -- Nectar Cup -- Nectar Straw -- Thin Teaspoon -- Thin Cup -- Thin Straw -- Puree -- Mechanical Soft -- Regular -- Multi-consistency -- Pill -- Cervical Esophageal Comment -- [name] 05/21/2019, 1:47 PM Sonia Baller, MA, CCC-SLP Speech Therapy [facility] Acute Rehab Pager: [phone]              Vas [place] Upper Ext Vein Mapping (pre-op Avf)  Result Date: 06/12/2019 UPPER EXTREMITY VEIN MAPPING  Indications: Pre-dialysis access. Comparison Study: No prior study on file for comparison. Performing Technologist: Sharion Dove RVS  Examination Guidelines: A complete evaluation includes B-mode imaging, spectral Doppler, color Doppler, and power Doppler as needed of all accessible portions of each vessel. Bilateral testing is considered an integral part of a complete examination. Limited examinations for reoccurring indications may be performed as noted.  +-----------------+-------------+----------+--------+  Right Cephalic    Diameter (cm) Depth (cm) Findings  +-----------------+-------------+----------+--------+  Prox upper arm        0.18         0.62              +-----------------+-------------+----------+--------+  Mid upper arm         0.18         0.63              +-----------------+-------------+----------+--------+  Dist upper arm        0.22         0.39              +-----------------+-------------+----------+--------+  Antecubital fossa     0.50         0.89              +-----------------+-------------+----------+--------+  Prox forearm          0.36         0.88              +-----------------+-------------+----------+--------+  Mid forearm           0.36         0.58              +-----------------+-------------+----------+--------+  Wrist                 0.43         0.49              +-----------------+-------------+----------+--------+ +-----------------+-------------+----------+---------------------------+  Right Basilic     Diameter (cm) Depth (cm)          Findings            +-----------------+-------------+----------+---------------------------+  Prox upper arm        0.32         0.37                                 +-----------------+-------------+----------+---------------------------+  Mid upper arm         0.32         0.49                                 +-----------------+-------------+----------+---------------------------+  Dist upper arm        0.40         0.40                                 +-----------------+-------------+----------+---------------------------+  Antecubital fossa     0.62         0.26                                 +-----------------+-------------+----------+---------------------------+  Prox forearm                               not visualized and bandages  +-----------------+-------------+----------+---------------------------+  Mid forearm           0.32         0.17                                  +-----------------+-------------+----------+---------------------------+  Wrist                 0.34         0.16                                 +-----------------+-------------+----------+---------------------------+ *See table(s) above for measurements and observations.  Diagnosing physician: [name] MD Electronically signed by [name] MD on 06/12/2019 at 11:47:31 AM.    Final    Ir Thoracentesis Asp Pleural Space W/img Guide  Result Date: 05/21/2019 INDICATION: Patient with history of COPD, emphysema, acute on chronic hypoxic and hypercapnic respiratory failure, and right pleural effusion. Request is made for diagnostic and therapeutic right thoracentesis. EXAM: ULTRASOUND GUIDED DIAGNOSTIC AND THERAPEUTIC RIGHT THORACENTESIS MEDICATIONS: 10 mL 1% lidocaine COMPLICATIONS: None immediate. PROCEDURE: An ultrasound guided thoracentesis was thoroughly discussed with the patient and questions answered. The benefits, risks, alternatives and complications were also discussed. The patient understands and wishes to proceed with the procedure. Written consent was obtained. Ultrasound was performed to localize and mark an adequate pocket of fluid in the right chest. The area was then prepped and draped in the normal sterile fashion. 1% Lidocaine was used  for local anesthesia. Under ultrasound guidance a 6 Fr Safe-T-Centesis catheter was introduced. Thoracentesis was performed. The catheter was removed and a dressing applied. FINDINGS: A total of approximately 1.85 L of dark red fluid was removed. Samples were sent to the laboratory as requested by the clinical team. IMPRESSION: Successful ultrasound guided right thoracentesis yielding 1.85 L of pleural fluid. Read by: Earley Abide, PA-C Electronically Signed   By: Jerilynn Mages.  Shick M.D.   On: 05/21/2019 14:31   US Thoracentesis Asp Pleural Space W/img Guide  Result Date: 06/10/2019 INDICATION: Patient with history of colon cancer, COPD, HF, CKD,  dyspnea, and recurrent right pleural effusion. Request is made for diagnostic and therapeutic right thoracentesis. EXAM: ULTRASOUND GUIDED DIAGNOSTIC AND THERAPEUTIC RIGHT THORACENTESIS MEDICATIONS: 15 mL 1% lidocaine COMPLICATIONS: None immediate. PROCEDURE: An ultrasound guided thoracentesis was thoroughly discussed with the patient and questions answered. The benefits, risks, alternatives and complications were also discussed. The patient understands and wishes to proceed with the procedure. Written consent was obtained. Ultrasound was performed to localize and mark an adequate pocket of fluid in the right chest. The area was then prepped and draped in the normal sterile fashion. 1% Lidocaine was used for local anesthesia. Under ultrasound guidance a 6 Fr Safe-T-Centesis catheter was introduced. Thoracentesis was performed. The catheter was removed and a dressing applied. FINDINGS: A total of approximately 2.3 L of dark red fluid was removed. Samples were sent to the laboratory as requested by the clinical team. IMPRESSION: Successful ultrasound guided right thoracentesis yielding 2.3 L of pleural fluid. Read by: Earley Abide, PA-C Electronically Signed   By: Markus Daft M.D.   On: 06/10/2019 13:22   US Thoracentesis Asp Pleural Space W/img Guide  Result Date: 06/02/2019 INDICATION: Patient with history of colon cancer, COPD, CHF, chronic kidney disease, dyspnea, recurrent right pleural effusion. Request made for diagnostic and therapeutic right thoracentesis. EXAM: ULTRASOUND GUIDED DIAGNOSTIC AND THERAPEUTIC RIGHT THORACENTESIS MEDICATIONS: None COMPLICATIONS: None immediate. PROCEDURE: An ultrasound guided thoracentesis was thoroughly discussed with the patient and questions answered. The benefits, risks, alternatives and complications were also discussed. The patient understands and wishes to proceed with the procedure. Written consent was obtained. Ultrasound was performed to localize and mark an  adequate pocket of fluid in the right chest. The area was then prepped and draped in the normal sterile fashion. 1% Lidocaine was used for local anesthesia. Under ultrasound guidance a 6 Fr Safe-T-Centesis catheter was introduced. Thoracentesis was performed. The catheter was removed and a dressing applied. FINDINGS: A total of approximately 1.4 liters of blood-tinged fluid was removed. Samples were sent to the laboratory as requested by the clinical team. Due to patient hypotension only the above amount of fluid was removed today. IMPRESSION: Successful ultrasound guided diagnostic and therapeutic right thoracentesis yielding 1.4 liters of pleural fluid. Read by: Rowe Robert, PA-C Electronically Signed   By: Jerilynn Mages.  Shick M.D.   On: 06/02/2019 10:46     Subjective: No major events overnight of this point.  Reports feeling well.  Denies chest pain, dyspnea, lightheadedness, GI or GU symptoms.  Likes to work with PT/OT again today before he goes home.  Aware of the plan about going to outpatient dialysis center and signing paperwork today and starting dialysis tomorrow.   Discharge Exam: Vitals:   06/15/19 0701 06/15/19 0908  BP: (!) 94/55   Pulse: 75 92  Resp:  19  Temp: 97.7 F (36.5 C)   SpO2: 94% 96%    GENERAL: No acute distress.  Appears well.  HEENT: MMM.  Vision and hearing grossly intact.  NECK: Supple.  No apparent JVD. LUNGS: Saturating in low 90s on 4 L by [place].  No IWOB.  Fair air movement bilaterally. HEART:  RRR. Heart sounds normal.  ABD: Bowel sounds present. Soft. Non tender.  MSK/EXT:  Moves all extremities. No apparent deformity. No edema bilaterally. SKIN: no apparent skin lesion or wound NEURO: Awake, alert and oriented appropriately.  No gross deficit.  PSYCH: Calm. Normal affect.   Left RCF with good bruit.  No hematoma   The results of significant diagnostics from this hospitalization (including imaging, microbiology, ancillary and laboratory) are listed below for  reference.     Microbiology: Recent Results (from the past 240 hour(s))  Surgical pcr screen     Status: None   Collection Time: 06/09/19  9:49 AM   Specimen: Nasal Mucosa; Nasal Swab  Result Value Ref Range Status   MRSA, PCR NEGATIVE NEGATIVE Final   Staphylococcus aureus NEGATIVE NEGATIVE Final    Comment: (NOTE) The Xpert SA Assay (FDA approved for NASAL specimens in patients 28 years of age and older), is one component of a comprehensive surveillance program. It is not intended to diagnose infection nor to guide or monitor treatment. Performed at [facility], [place] [address] [place], [place] [address]   SARS Coronavirus 2 (CEPHEID - Performed in [place] hospital lab), Hosp Order     Status: None   Collection Time: 06/10/19  9:35 AM   Specimen: Nasopharyngeal Swab  Result Value Ref Range Status   SARS Coronavirus 2 NEGATIVE NEGATIVE Final    Comment: (NOTE) If result is NEGATIVE SARS-CoV-2 target nucleic acids are NOT DETECTED. The SARS-CoV-2 RNA is generally detectable in upper and lower  respiratory specimens during the acute phase of infection. The lowest  concentration of SARS-CoV-2 viral copies this assay can detect is 250  copies / mL. A negative result does not preclude SARS-CoV-2 infection  and should not be used as the sole basis for treatment or other  patient management decisions.  A negative result may occur with  improper specimen collection / handling, submission of specimen other  than nasopharyngeal swab, presence of viral mutation(s) within the  areas targeted by this assay, and inadequate number of viral copies  (<250 copies / mL). A negative result must be combined with clinical  observations, patient history, and epidemiological information. If result is POSITIVE SARS-CoV-2 target nucleic acids are DETECTED. The SARS-CoV-2 RNA is generally detectable in upper and lower  respiratory specimens dur ing the acute phase of infection.  Positive   results are indicative of active infection with SARS-CoV-2.  Clinical  correlation with patient history and other diagnostic information is  necessary to determine patient infection status.  Positive results do  not rule out bacterial infection or co-infection with other viruses. If result is PRESUMPTIVE POSTIVE SARS-CoV-2 nucleic acids MAY BE PRESENT.   A presumptive positive result was obtained on the submitted specimen  and confirmed on repeat testing.  While 2019 novel coronavirus  (SARS-CoV-2) nucleic acids may be present in the submitted sample  additional confirmatory testing may be necessary for epidemiological  and / or clinical management purposes  to differentiate between  SARS-CoV-2 and other Sarbecovirus currently known to infect humans.  If clinically indicated additional testing with an alternate test  methodology [phone]) is advised. The SARS-CoV-2 RNA is generally  detectable in upper and lower respiratory sp ecimens during the acute  phase  of infection. The expected result is Negative. Fact Sheet for Patients:  StrictlyIdeas.no Fact Sheet for Healthcare Providers: BankingDealers.co.za This test is not yet approved or cleared by the [place] FDA and has been authorized for detection and/or diagnosis of SARS-CoV-2 by FDA under an Emergency Use Authorization (EUA).  This EUA will remain in effect (meaning this test can be used) for the duration of the COVID-19 declaration under Section 564(b)(1) of the Act, 21 U.S.C. section 360bbb-3(b)(1), unless the authorization is terminated or revoked sooner. Performed at [facility], [place] [address] [place], [place] [address]   Culture, body fluid-bottle     Status: None   Collection Time: 06/10/19 12:32 PM   Specimen: Pleura  Result Value Ref Range Status   Specimen Description PLEURAL RIGHT  Final   Special Requests NONE  Final   Culture   Final    NO GROWTH 5  DAYS Performed at [facility] [address] [place], [place] [address]    Report Status 06/15/2019 FINAL  Final  Gram stain     Status: None   Collection Time: 06/10/19 12:32 PM   Specimen: Pleura  Result Value Ref Range Status   Specimen Description PLEURAL RIGHT  Final   Special Requests NONE  Final   Gram Stain   Final    RARE WBC PRESENT, PREDOMINANTLY PMN NO ORGANISMS SEEN Performed at [facility], [place] [address] [place], [place] [address]    Report Status 06/10/2019 FINAL  Final     Labs: BNP (last 3 results) Recent Labs    03/14/19 0405 03/20/19 0431 05/19/19 0901  BNP 457.1* 191.2* 681.1*   Basic Metabolic Panel: Recent Labs  Lab 06/10/19 1104 06/12/19 0634 06/13/19 0313 06/14/19 0529 06/15/19 0753  NA 142 140 137 138 137  K 4.2 3.7 3.1* 4.3 4.8  CL 100 101 98 102 100  CO2 26 28 28 25 25   GLUCOSE 109* 102* 114* 155* 114*  BUN 92* 52* 59* 34* 59*  CREATININE 4.54* 3.63* 3.89* 2.99* 4.25*  CALCIUM 8.6* 8.4* 8.3* 8.5* 8.8*  MG 2.1 1.9 1.9 1.8  --   PHOS 7.6*  --   --   --  6.2*   Liver Function Tests: Recent Labs  Lab 06/15/19 0753  ALBUMIN 2.5*   No results for input(s): LIPASE, AMYLASE in the last 168 hours. No results for input(s): AMMONIA in the last 168 hours. CBC: Recent Labs  Lab 06/12/19 0634 06/13/19 0313 06/14/19 0529 06/14/19 1500 06/15/19 0753  WBC 9.4 10.0 5.5 11.3* 9.4  HGB 9.8* 9.5* 10.8* 10.9* 10.0*  HCT 32.8* 31.3* 35.5* 36.8* 34.1*  MCV 107.5* 105.4* 106.0* 107.0* 107.6*  PLT 185 185 203 226 210   Cardiac Enzymes: No results for input(s): CKTOTAL, CKMB, CKMBINDEX, TROPONINI in the last 168 hours. BNP: Invalid input(s): POCBNP CBG: Recent Labs  Lab 06/08/19 1151 06/08/19 1644 06/08/19 2129 06/09/19 0630  GLUCAP 96 117* 177* 122*   D-Dimer No results for input(s): DDIMER in the last 72 hours. Hgb A1c No results for input(s): HGBA1C in the last 72 hours. Lipid Profile No results for input(s):  CHOL, HDL, LDLCALC, TRIG, CHOLHDL, LDLDIRECT in the last 72 hours. Thyroid function studies No results for input(s): TSH, T4TOTAL, T3FREE, THYROIDAB in the last 72 hours.  Invalid input(s): FREET3 Anemia work up No results for input(s): VITAMINB12, FOLATE, FERRITIN, TIBC, IRON, RETICCTPCT in the last 72 hours. Urinalysis    Component Value Date/Time  COLORURINE YELLOW 06/03/2019 1022   APPEARANCEUR HAZY (A) 06/03/2019 1022   LABSPEC 1.014 06/03/2019 1022   PHURINE 5.0 06/03/2019 1022   GLUCOSEU NEGATIVE 06/03/2019 1022   HGBUR NEGATIVE 06/03/2019 1022   BILIRUBINUR NEGATIVE 06/03/2019 1022   KETONESUR NEGATIVE 06/03/2019 1022   PROTEINUR 30 (A) 06/03/2019 1022   UROBILINOGEN 1.0 05/08/2013 1738   NITRITE NEGATIVE 06/03/2019 1022   LEUKOCYTESUR NEGATIVE 06/03/2019 1022   Sepsis Labs Invalid input(s): PROCALCITONIN,  WBC,  LACTICIDVEN   Time coordinating discharge: 40 minutes  SIGNED:  Mercy Riding, MD  [facility] 06/15/2019, 9:33 AM  If 7PM-7AM, please contact night-coverage [email or web address] Password [email or web address]

## 2019-06-15 NOTE — Discharge Instructions (Addendum)
[facility]  Discharge Instructions  AV Fistula or Graft Surgery for Dialysis Access  Please refer to the following instructions for your post-procedure care. Your surgeon or physician assistant will discuss any changes with you.  Activity  You may drive the day following your surgery, if you are comfortable and no longer taking prescription pain medication. Resume full activity as the soreness in your incision resolves.  Bathing/Showering  You may shower after you go home. Keep your incision dry for 48 hours. Do not soak in a bathtub, hot tub, or swim until the incision heals completely. You may not shower if you have a hemodialysis catheter.  Incision Care  Clean your incision with mild soap and water after 48 hours. Pat the area dry with a clean towel. You do not need a bandage unless otherwise instructed. Do not apply any ointments or creams to your incision. You may have skin glue on your incision. Do not peel it off. It will come off on its own in about one week. Your arm may swell a bit after surgery. To reduce swelling use pillows to elevate your arm so it is above your heart. Your doctor will tell you if you need to lightly wrap your arm with an ACE bandage.  Diet  Resume your normal diet. There are not special food restrictions following this procedure. In order to heal from your surgery, it is CRITICAL to get adequate nutrition. Your body requires vitamins, minerals, and protein. Vegetables are the best source of vitamins and minerals. Vegetables also provide the perfect balance of protein. Processed food has little nutritional value, so try to avoid this.  Medications  Resume taking all of your medications. If your incision is causing pain, you may take over-the counter pain relievers such as acetaminophen (Tylenol). If you were prescribed a stronger pain medication, please be aware these medications can cause nausea and constipation. Prevent  nausea by taking the medication with a snack or meal. Avoid constipation by drinking plenty of fluids and eating foods with high amount of fiber, such as fruits, vegetables, and grains. Do not take Tylenol if you are taking prescription pain medications.     Follow up Your surgeon may want to see you in the office following your access surgery. If so, this will be arranged at the time of your surgery.  Please call us immediately for any of the following conditions:  Increased pain, redness, drainage (pus) from your incision site Fever of 101 degrees or higher Severe or worsening pain at your incision site Hand pain or numbness.  Reduce your risk of vascular disease:  Stop smoking. If you would like help, call [facility] at 1-800-QUIT-NOW 838-245-4730) or [place] at [place] your cholesterol Maintain a desired weight Control your diabetes Keep your blood pressure down  Dialysis  It will take several weeks to several months for your new dialysis access to be ready for use. Your surgeon will determine when it is OK to use it. Your nephrologist will continue to direct your dialysis. You can continue to use your Permcath until your new access is ready for use.  If you have any questions, please call the office at [phone].  Information on my medicine - ELIQUIS (apixaban)  This medication education was reviewed with me or my healthcare representative as part of my discharge preparation.  The pharmacist that spoke with me during my hospital stay was:  Einar Grad, [facility]  Why was Eliquis  prescribed for you? Eliquis was prescribed for you to reduce the risk of a blood clot forming that can cause a stroke if you have a medical condition called atrial fibrillation (a type of irregular heartbeat).  What do You need to know about Eliquis ? Take your Eliquis TWICE DAILY - one tablet in the morning and one tablet in the evening with or without food. If you have  difficulty swallowing the tablet whole please discuss with your pharmacist how to take the medication safely.  Take Eliquis exactly as prescribed by your doctor and DO NOT stop taking Eliquis without talking to the doctor who prescribed the medication.  Stopping may increase your risk of developing a stroke.  Refill your prescription before you run out.  After discharge, you should have regular check-up appointments with your healthcare provider that is prescribing your Eliquis.  In the future your dose may need to be changed if your kidney function or weight changes by a significant amount or as you get older.  What do you do if you miss a dose? If you miss a dose, take it as soon as you remember on the same day and resume taking twice daily.  Do not take more than one dose of ELIQUIS at the same time to make up a missed dose.  Important Safety Information A possible side effect of Eliquis is bleeding. You should call your healthcare provider right away if you experience any of the following: ? Bleeding from an injury or your nose that does not stop. ? Unusual colored urine (red or dark brown) or unusual colored stools (red or black). ? Unusual bruising for unknown reasons. ? A serious fall or if you hit your head (even if there is no bleeding).  Some medicines may interact with Eliquis and might increase your risk of bleeding or clotting while on Eliquis. To help avoid this, consult your healthcare provider or pharmacist prior to using any new prescription or non-prescription medications, including herbals, vitamins, non-steroidal anti-inflammatory drugs (NSAIDs) and supplements.  This website has more information on Eliquis (apixaban): http://www.eliquis.com/eliquis/home

## 2019-06-16 LAB — RENAL FUNCTION PANEL
Albumin: 2.5 g/dL — ABNORMAL LOW (ref 3.5–5.0)
Anion gap: 12 (ref 5–15)
BUN: 59 mg/dL — ABNORMAL HIGH (ref 8–23)
CO2: 25 mmol/L (ref 22–32)
Calcium: 8.8 mg/dL — ABNORMAL LOW (ref 8.9–10.3)
Chloride: 100 mmol/L (ref 98–111)
Creatinine, Ser: 4.25 mg/dL — ABNORMAL HIGH (ref 0.61–1.24)
GFR calc Af Amer: 14 mL/min — ABNORMAL LOW (ref >60)
GFR calc non Af Amer: 12 mL/min — ABNORMAL LOW (ref >60)
Glucose, Bld: 114 mg/dL — ABNORMAL HIGH (ref 70–99)
Phosphorus: 6.2 mg/dL — ABNORMAL HIGH (ref 2.5–4.6)
Potassium: 4.8 mmol/L (ref 3.5–5.1)
Sodium: 137 mmol/L (ref 135–145)

## 2019-06-18 ENCOUNTER — Telehealth: Payer: Self-pay | Admitting: Internal Medicine

## 2019-06-18 NOTE — Telephone Encounter (Signed)
Spoke with pt.  appt 06/27/19 at 945am.  Nothing further is needed.

## 2019-06-27 ENCOUNTER — Ambulatory Visit: Payer: Medicare Other | Admitting: Internal Medicine

## 2019-06-27 ENCOUNTER — Other Ambulatory Visit: Payer: Self-pay

## 2019-06-27 ENCOUNTER — Ambulatory Visit (INDEPENDENT_AMBULATORY_CARE_PROVIDER_SITE_OTHER): Payer: Medicare Other

## 2019-06-27 ENCOUNTER — Encounter: Payer: Self-pay | Admitting: Internal Medicine

## 2019-06-27 DIAGNOSIS — J90 Pleural effusion, not elsewhere classified: Secondary | ICD-10-CM | POA: Diagnosis not present

## 2019-06-27 DIAGNOSIS — J449 Chronic obstructive pulmonary disease, unspecified: Secondary | ICD-10-CM | POA: Diagnosis not present

## 2019-06-27 DIAGNOSIS — J9612 Chronic respiratory failure with hypercapnia: Secondary | ICD-10-CM

## 2019-06-27 DIAGNOSIS — J189 Pneumonia, unspecified organism: Secondary | ICD-10-CM

## 2019-06-27 DIAGNOSIS — J9611 Chronic respiratory failure with hypoxia: Secondary | ICD-10-CM | POA: Diagnosis not present

## 2019-06-27 MED ORDER — ALBUTEROL SULFATE (2.5 MG/3ML) 0.083% IN NEBU: 2.5000 mg | INHALATION_SOLUTION | RESPIRATORY_TRACT | 12 refills | Status: DC | PRN

## 2019-06-27 MED ORDER — TRELEGY ELLIPTA 100-62.5-25 MCG/INH IN AEPB: 1.0000 | INHALATION_SPRAY | RESPIRATORY_TRACT | 11 refills | Status: DC

## 2019-06-27 MED ORDER — TRELEGY ELLIPTA 100-62.5-25 MCG/INH IN AEPB: 1.0000 | INHALATION_SPRAY | Freq: Every day | RESPIRATORY_TRACT | 0 refills | Status: DC

## 2019-06-27 MED ORDER — PROAIR DIGIHALER 108 MCG/ACT IN AEPB: 2.0000 | INHALATION_SPRAY | RESPIRATORY_TRACT | 0 refills | Status: DC | PRN

## 2019-06-27 MED ORDER — PROAIR RESPICLICK 108 (90 BASE) MCG/ACT IN AEPB: 1.0000 | INHALATION_SPRAY | RESPIRATORY_TRACT | Status: DC | PRN

## 2019-06-27 NOTE — Progress Notes (Signed)
[name], male    DOB: [birth year],    MRN: [id number]   Brief patient profile:  28 yowm  Quit smoking 1983  At wt 180 with baseline able to walk the dog 10 minutes slow pace but  avoided steps maint on spiriva with GOLD II COPD criteria 07/16/13     History of Present Illness  04/24/2019  Pulmonary/  office eval/Delle Andrzejewski / 02 4lpm 24/7 / persistent afib and started amiodarone since last ov Chief Complaint  Patient presents with   Pulmonary Consult    Referred by Kerin Ransom, PA. Pt c/o SOB since March 2020. Pt c/o DOE with walking from room to room.  He has had some cough with clear to bloody sputum.     Dyspnea:  Gradually losing ground now Room to room gives out due to sob even on 4lpm  Cough: more bloody/ no pain with cough Sleep: on flat bed, 3 pillows on back SABA OZD:GUYQIH helps the most  rec Stop spiriva and take the  duoneb up to 4 x in 24 hours as you feel you need it if it helps your shortness of breath For cough/congestion >  mucinex or mucinex dm up to 1200 mg every 12 hours and use flutter valve  as much as you can  Humidify 02 as much of the time as your can  Add:  cxr worse >>>  rx levaquin 500 x 7 days     05/04/2019  f/u ov/Aubrielle Stroud re: copd/ pna vs alv hem vs amio effects/ main concern now is weak Dyspnea:  Same = across the room, even on 02  Cough: dry now day > noct  Sleeping: flat bed/ 1  Pillow  s resp symptoms SABA use: just the duoneb qid 02: 4lpm and sats ok x with walking sometimes drop into 80s  rec Please remember to go to the lab department   for your tests - we will call you with the results when they are available.  No change in recommendations  Please schedule a follow up office visit in 4 weeks, sooner if needed with cxr  Additional recs p reviewed studies 1) adjust 02 to low 90's at rest and with activity  2) u/s  thoracentesis     Admit date: 05/19/2019 Discharge date: 05/30/2019  Time spent: 35 minutes  Recommendations for Outpatient  Follow-up:  1. Continue physical therapy at inpatient rehab.  Discharge Diagnoses:       Active Hospital Problems   Diagnosis Date Noted   Acute on chronic respiratory failure ([place]) 05/19/2019   HCAP (healthcare-associated pneumonia) 04/25/2019   Chronic respiratory failure with hypoxia ([place]) 04/25/2019   Atrial fibrillation with RVR ([place]) 03/11/2019   AKI (acute kidney injury) ([place]) 11/04/2016    Resolved Hospital Problems   Diagnosis Date Noted Date Resolved   Community acquired pneumonia of right lung 11/03/2016 05/21/2019    Discharge Condition: Stable  Diet recommendation: Resume previous diet, heart healthy diet.      Vitals:   05/30/19 0821 05/30/19 0831  BP:    Pulse:    Resp:    Temp:    SpO2: 94% 93%    History of present illness:  85 year old male with PMH of COPD, chronic hypoxic respiratory failure on home oxygen at 4 L/min, chronic systolic CHF with EF 47% in 2017 which subsequently normalized, A. fib/PSVT with history of TEE cardioversion, HTN, stage IV CKD, alcohol abuse, colon cancer who initially presented with chest pain and dyspnea. Recent  prolonged hospitalization in March/April for pneumonia, septic shock complicated by A. fib with RVR and worsening renal failure. In ED developed progressive hypoxia, placed on BiPAP which patient not fully compliant with, DNR, CT chest 5/31 showed new right-sided consolidation with right moderate pleural effusion, high PCT, suspecting H CAP, status post thoracentesis 05/21/2019. Cardiology consulted 6/3. Patient wasmedically stable for discharge since 6/5 at which point he was waiting for insurance approval for CIR which was initially denied on 6/8 but approved after peer to peer MD discussion on 6/9. Overnight 6/8, hypoxic in the 40s requiring NRBM>Ventimask, pulmonary reconsulted and will try nocturnal auto titrating CPAP tonight and hopefully discharge 6/10 to CIR with CPAP.  05/30/19: Patient  seen and examined at his bedside.  Declined CPAP at night due to claustrophobia.  Tolerated Ventimask.  No new concerns.  Labs reviewed and are stable.  On the day of discharge, the patient was hemodynamically stable.  He will need to continue physical therapy at inpatient rehab.  Hospital Course:  Principal Problem:   Acute on chronic respiratory failure ([name]) Active Problems:   AKI (acute kidney injury) ([place])   Atrial fibrillation with RVR ([name])   HCAP (healthcare-associated pneumonia)   Chronic respiratory failure with hypoxia ([name])  Acute on chronic hypoxic respiratory failure -Likely multifactorial secondary to HCAP and right-sided pleural effusion. He may have underlying undiagnosed sleep apnea. -Has completed course of antibiotics (IV cefepime followed by oral Augmentin). - Blood cultures x2: Negative to date. - SARS coronavirus 2: Negative. - CT chest showed worsening right sided consolidation and effusion and hence underwent thoracentesis, please see discussion below. -As per speech therapy, on regular diet. - Acetylcholine receptor antibody resultsnegative. - As per update from rehab team who evaluated him during prior hospitalization, prior to recent hospitalization, he was independent and used oxygen 2 L/min. After recent hospitalization, CIR was denied, returned home, ambulating with the help of a walker and was walking up to 50 yards and on oxygen 4 L/min. -Target for oxygen saturation between 89-92%. -Patient declining or unable to tolerate BiPAP more than a short time. -Weaned to oxygen 3 L/min at daytime. -Ongoing issues with nocturnal hypoxemia. -Overnight 6/8, hypoxic in the 40s requiring NRBM>Ventimask, pulmonary reconsulted and will try nocturnal auto titrating CPAP tonight  -Patient declines CPAP due to claustrophobia. -Continue with current oxygen prescription in CIR as recommended by pulmonology.  Suspected right lower lobe HCAP versus likely  aspiration pneumonia -Completed course of antibiotics -Negative swallow evaluation. -Recommend repeating CT chest in 4 to 6 weeks and outpatient follow-up arranged arranged by pulmonology.  Right-sided parapneumonic pleural effusion -Status post ultrasound-guided thoracentesis by IR on 6/1: Total of 1.85 L of dark red fluid was removed. - Pleural fluid: WBCs 1463, 82% neutrophils, protein 3.2, LDH 140, culture negative to date. Reactive mesothelial cells and macrophages present, no atypia seen. -Suspected exudative? Parapneumonic. -Completed antibiotic course -Chest x-ray 6/3 without sizable effusion. Improved. -Improved and stable -Independently reviewed chest x-ray done on 05/23/2019 which showed bibasilar infiltrates right greater than left.  Completed course of antibiotics.  Acute on stage IV chronic kidney disease -Baseline creatinine reportedly approximately 2.5. -On 6/1 creatinine up to 3.7. Lasix discontinued. Creatinine continues to gradually improve, down to 3.12 today. -Patient absolutely does not want dialysis. -Periodically follow BMP after discharge.  Acute systolic CHF/LVEF 43-15%/[id number]. -Last EF was 60-65%. -Clinically appears euvolemic.Continue Lasix 40 mg twice daily. -Not a candidate for ACEI/ARB/Aldactone due to CKD. CHF not felt to be primary etiology for patient's pleural  effusion. -Improved and stable. Continue current dose of Lasix and Zebeta.  A. fib/flutter with RVR -Failed TEE cardioversion on 03/16/2019. -Remains on amiodarone and Zebeta. -Cardizem discontinued -As per discussion with cardiology, cardioversion may be less likely to be successful until primary pulmonary processes addressed.  -IV heparin discontinued and started Eliquis due to chronic kidney disease. - Op f/u with Dr. Quay Burow, Cardiology 6/17  Elevated TSH -Likely sick euthyroid. Follow TSH in 4 weeks.  Gout -No acute flare presently. Continue allopurinol  prophylaxis.  Morbid obesity/Body mass index is 32.11 kg/m.  Likely undiagnosed OSA Management as discussed above. Trial of CPAP with auto titration tonight.  Macrocytic anemia Stable. Received IV iron. Continue oral iron. Patient reports that he he has an outpatient appointment to come to [facility] on 6/11 for IV iron by his Nephrologist. This can bedone at CIR.  Physical deconditioning -Continue physical therapy at CIR.      06/27/2019  f/u ov/Consepcion Utt re:  S/p thoracentesis/ HD initiation on anoro and pulmocort Chief Complaint  Patient presents with   Follow-up    Breathing is "ok" no new co's.   Dyspnea:  Room to room at home / on 3lpm  Cough: none Sleeping: flat in bed / two pillows  02: 3lpm 24/7    No obvious day to day or daytime variability or assoc excess/ purulent sputum or mucus plugs or hemoptysis or cp or chest tightness, subjective wheeze or overt sinus or hb symptoms.   Sleeping as above without nocturnal  or early am exacerbation  of respiratory  c/o's or need for noct saba. Also denies any obvious fluctuation of symptoms with weather or environmental changes or other aggravating or alleviating factors except as outlined above   No unusual exposure hx or h/o childhood pna/ asthma or knowledge of premature birth.  Current Allergies, Complete Past Medical History, Past Surgical History, Family History, and Social History were reviewed in [employer] record.  ROS  The following are not active complaints unless bolded Hoarseness, sore throat, dysphagia, dental problems, itching, sneezing,  nasal congestion or discharge of excess mucus or purulent secretions, ear ache,   fever, chills, sweats, unintended wt loss or wt gain, classically pleuritic or exertional cp,  orthopnea pnd or arm/hand swelling  or leg swelling, presyncope, palpitations, abdominal pain, anorexia, nausea, vomiting, diarrhea  or change in bowel habits or change in bladder  habits, change in stools or change in urine, dysuria, hematuria,  rash, arthralgias, visual complaints, headache, numbness, weakness or ataxia or problems with walking or coordination,  change in mood or  memory.        Current Meds - - NOTE:   Unable to verify as accurately reflecting what pt takes     Medication Sig   allopurinol (ZYLOPRIM) 100 MG tablet Take 100 mg by mouth daily.   amiodarone (PACERONE) 200 MG tablet Take 1 tablet (200 mg total) by mouth daily.   apixaban (ELIQUIS) 2.5 MG TABS tablet Take 1 tablet (2.5 mg total) by mouth 2 (two) times daily.   bisoprolol (ZEBETA) 5 MG tablet Take 5 mg by mouth daily.   budesonide (PULMICORT) 0.25 MG/2ML nebulizer solution Take 2 mLs (0.25 mg total) by nebulization 2 (two) times daily.   ferrous sulfate 325 (65 FE) MG tablet Take 325 mg by mouth daily.    furosemide (LASIX) 40 MG tablet Take 1 tablet (40 mg total) by mouth 2 (two) times daily.   ipratropium-albuterol (DUONEB) 0.5-2.5 (3) MG/3ML SOLN Use  twice a day scheduled and every 4 hours as needed for shortness of breath and wheezing   multivitamin (RENA-VIT) TABS tablet Take 1 tablet by mouth at bedtime.   OXYGEN Inhale 4 L into the lungs continuous.   sevelamer carbonate (RENVELA) 800 MG tablet Take 1 tablet (800 mg total) by mouth 3 (three) times daily with meals.   traZODone (DESYREL) 50 MG tablet Take 0.5-1 tablets (25-50 mg total) by mouth at bedtime as needed for sleep.   umeclidinium-vilanterol (ANORO ELLIPTA) 62.5-25 MCG/INH AEPB Inhale 1 puff into the lungs daily.                  Objective:        06/27/2019         208   04/24/19 231 lb (104.8 kg)  04/17/19 225 lb (102.1 kg)  03/26/19 226 lb 3.1 oz (102.6 kg)    W/c bound wm nad easily confused with details of care, names of meds/ nebulizer vs inhaler "I use a tube"   Vital signs reviewed - Note on arrival 02 sats  95% on 3lpm cont        HEENT: nl dentition / oropharynx. Nl external ear  canals without cough reflex -  Mild bilateral non-specific turbinate edema     NECK :  without JVD/Nodes/TM/ nl carotid upstrokes bilaterally   LUNGS: no acc muscle use,  Mild barrel  contour chest wall with bilateral  Distant bs esp  R base s audible wheeze and  without cough on insp or exp maneuver and dullness to percussion R base   CV:  RRR  no s3 or murmur or increase in P2, and no edema   ABD:  soft and nontender with pos end  insp Hoover's  in the supine position. No bruits or organomegaly appreciated, bowel sounds nl  MS:    ext warm without deformities, calf tenderness, cyanosis or clubbing No obvious joint restrictions   SKIN: warm and dry without lesions    NEURO:  alert, approp, nl sensorium with  no motor or cerebellar deficits apparent.         CXR PA and Lateral:   06/27/2019 :    I personally reviewed images and agree with radiology impression as follows:  Increase in size of the right effusion. Trace left effusion present as well. Bibasilar atelectasis with more focal consolidation in the right lower lobe possibly reflecting persistent infectious consolidation.            Assessment

## 2019-06-27 NOTE — Patient Instructions (Addendum)
Plan A = Automatic = Trelegy one click - take two good drags   Plan B = Backup Only use your albuterol (proair resplick) as a rescue medication to be used if you can't catch your breath by resting or doing a relaxed purse lip breathing pattern.  - The less you use it, the better it will work when you need it. - Ok to use the inhaler up to 1-2 puffs  every 4 hours if you must but call for appointment if use goes up over your usual need - Don't leave home without it !!  (think of it like the spare tire for your car)   Plan C = Crisis - only use your albuterol nebulizer if you first try Plan B and it fails to help > ok to use the nebulizer up to every 4 hours but if start needing it regularly call for immediate appointment   Please remember to go to the  x-ray department  for your tests - we will call you with the results when they are available     Stay on 3lpm at bedtime but ok to adjust during the day to keep your saturations over 90%   Add:  Ov in 4 week with cxr

## 2019-06-28 ENCOUNTER — Encounter: Payer: Self-pay | Admitting: Internal Medicine

## 2019-06-28 DIAGNOSIS — J90 Pleural effusion, not elsewhere classified: Secondary | ICD-10-CM | POA: Insufficient documentation

## 2019-06-28 NOTE — Assessment & Plan Note (Signed)
Onset 02/2019 secondary to cap, chf/ copd HC03 05/04/2019 = 32   Adequate control on present rx, reviewed in detail with pt > no change in rx needed  = 3lpm 24/7

## 2019-06-28 NOTE — Progress Notes (Signed)
Spoke with pt and notified of results per Dr. Wert. Pt verbalized understanding and denied any questions. 

## 2019-06-28 NOTE — Assessment & Plan Note (Signed)
Quit smoking 1983  - PFTs 07/16/2013  FEV1  1.75 (61%) ratio 52 and 15% better p B2, DLCO 74% - 06/27/2019  After extensive coaching inhaler device,  effectiveness =    90% with dpi so changed to trelegy maint and proair respiclick  Based on eval today need to keep it as simple as possible for him to improve compliance.  Apparently  Group D in terms of symptom/risk and laba/lama/ICS  therefore appropriate rx at this point >>>  trelegy approp for now

## 2019-06-28 NOTE — Assessment & Plan Note (Signed)
R throaceneteis 05/21/19   =  1.85 l slt bloody exudative, nl glucose, nl cells on path review Repeat 06/02/19 x 1.4 liters Repeat 6/21 x 2.3 liters dark red  cyt neg   Likely related to vol overload/ cri/ anticoagulation and improved overall but may need consideration for pleurex if recurs on HD   NB :  Note that pleural effusion and copd have the same effect on insp muscles/mechanics (both shorten their length prior to inspiration making them weaker with less force reserve) so they are synergistic in causing sob.    F/u in 4 weeks but in meantime if worse sob needs tap and refer to t surgery for pleurex.   I had an extended discussion with the patient reviewing all relevant studies completed to date and  lasting 25 minutes of a 40  minute post hosp office visit addressing  severe non-specific but potentially very serious refractory respiratory symptoms of uncertain and potentially multiple  etiologies.   Each maintenance medication was reviewed in detail including most importantly the difference between maintenance and prns and under what circumstances the prns are to be triggered using an action plan format that is not reflected in the computer generated alphabetically organized AVS.    Please see AVS for specific instructions unique to this office visit that I personally wrote and verbalized to the the pt in detail and then reviewed with pt  by my nurse highlighting any changes in therapy/plan of care  recommended at today's visit.

## 2019-07-04 LAB — ACID FAST CULTURE WITH REFLEXED SENSITIVITIES (MYCOBACTERIA): Acid Fast Culture: NEGATIVE

## 2019-07-10 ENCOUNTER — Other Ambulatory Visit: Payer: Self-pay

## 2019-07-10 ENCOUNTER — Telehealth: Payer: Self-pay | Admitting: Cardiology

## 2019-07-10 DIAGNOSIS — N186 End stage renal disease: Secondary | ICD-10-CM

## 2019-07-10 DIAGNOSIS — R600 Localized edema: Secondary | ICD-10-CM

## 2019-07-10 NOTE — Telephone Encounter (Signed)
LVM, reminding pt of his appt with Kerin Ransom on 07-11-19.

## 2019-07-11 ENCOUNTER — Other Ambulatory Visit: Payer: Self-pay

## 2019-07-11 ENCOUNTER — Ambulatory Visit (INDEPENDENT_AMBULATORY_CARE_PROVIDER_SITE_OTHER): Payer: Medicare Other | Admitting: Cardiology

## 2019-07-11 ENCOUNTER — Encounter: Payer: Self-pay | Admitting: Cardiology

## 2019-07-11 DIAGNOSIS — Z7901 Long term (current) use of anticoagulants: Secondary | ICD-10-CM | POA: Diagnosis not present

## 2019-07-11 DIAGNOSIS — J9612 Chronic respiratory failure with hypercapnia: Secondary | ICD-10-CM

## 2019-07-11 DIAGNOSIS — N186 End stage renal disease: Secondary | ICD-10-CM

## 2019-07-11 DIAGNOSIS — I4891 Unspecified atrial fibrillation: Secondary | ICD-10-CM

## 2019-07-11 DIAGNOSIS — I10 Essential (primary) hypertension: Secondary | ICD-10-CM

## 2019-07-11 DIAGNOSIS — J90 Pleural effusion, not elsewhere classified: Secondary | ICD-10-CM

## 2019-07-11 DIAGNOSIS — I5043 Acute on chronic combined systolic (congestive) and diastolic (congestive) heart failure: Secondary | ICD-10-CM | POA: Diagnosis not present

## 2019-07-11 DIAGNOSIS — Z992 Dependence on renal dialysis: Secondary | ICD-10-CM

## 2019-07-11 DIAGNOSIS — J9611 Chronic respiratory failure with hypoxia: Secondary | ICD-10-CM

## 2019-07-11 MED ORDER — APIXABAN 2.5 MG PO TABS: 2.5000 mg | ORAL_TABLET | Freq: Two times a day (BID) | ORAL | 3 refills | Status: DC

## 2019-07-11 NOTE — Assessment & Plan Note (Addendum)
Most recent echo June 2020 showed an EF of 45-50% with moderate LAE Volume now controlled with HD

## 2019-07-11 NOTE — Assessment & Plan Note (Signed)
R throaceneteis 05/21/19   =  1.85 l slt bloody exudative, nl glucose, nl cells on path review Repeat 06/02/19 x 1.4 liters Repeat 6/21 x 2.3 liters dark red  cyt neg

## 2019-07-11 NOTE — Assessment & Plan Note (Signed)
On chronic O2, unable to tolerate BiPap or C-pap.  O2 goal 89-92%

## 2019-07-11 NOTE — Assessment & Plan Note (Signed)
New onset 02/2019 in setting of CAP , respiratory failure, septic shock, AKI. Failed cardioversion twice during March admission- Amio added then with plans to attempt eventual DCCV (delayed secondary to COPD, and COVID).  Meanwhile patient admitted again with respiratory failure in June 2020.  He appears to be in NSR today (poor EKG tracing)

## 2019-07-11 NOTE — Assessment & Plan Note (Signed)
HD T Th Sat

## 2019-07-11 NOTE — Assessment & Plan Note (Signed)
Low dose Eliquis

## 2019-07-11 NOTE — Patient Instructions (Signed)
Medication Instructions:  Your physician recommends that you continue on your current medications as directed. Please refer to the Current Medication list given to you today. If you need a refill on your cardiac medications before your next appointment, please call your pharmacy.   Lab work: None  If you have labs (blood work) drawn today and your tests are completely normal, you will receive your results only by: . MyChart Message (if you have MyChart) OR . A paper copy in the mail If you have any lab test that is abnormal or we need to change your treatment, we will call you to review the results.  Testing/Procedures: None   Follow-Up: At [facility], you and your health needs are our priority.  As part of our continuing mission to provide you with exceptional heart care, we have created designated Provider Care Teams.  These Care Teams include your primary Cardiologist (physician) and Advanced Practice Providers (APPs -  Physician Assistants and Nurse Practitioners) who all work together to provide you with the care you need, when you need it. You will need a follow up appointment in 3 months.  Please call our office 2 months in advance to schedule this appointment.  You may see Jonathan Berry, MD or one of the following Advanced Practice Providers on your designated Care Team:   Luke Kilroy, PA-C Krista Kroeger, PA-C . Callie Goodrich, PA-C  Any Other Special Instructions Will Be Listed Below (If Applicable).   

## 2019-07-11 NOTE — Assessment & Plan Note (Signed)
Controlled.  

## 2019-07-11 NOTE — Progress Notes (Signed)
Cardiology Office Note:    Date:  07/11/2019   ID:  David Barton, DOB January 08, 1933, MRN [id number]  PCP:  Maury Dus, MD  Cardiologist:  Quay Burow, MD  Electrophysiologist:  None   Referring MD: Maury Dus, [facility] follow up from 6/18-6/26/2020 admission  History of Present Illness:    David Barton is a 85 y.o. male with a hx of prior cardiomyopathy in the setting of sepsis in 2018.  This subsequently recovered.  He was admitted to the hospital March 04, 2019 with acute respiratory failure felt to be secondary to community-acquired pneumonia.  During his hospitalization he had septic shock,  atrial fibrillation with rapid ventricular response, acute diastolic heart failure, and acute on chronic renal insufficiency.  The patient had 2 attempted cardioversions, both failed.  He was placed on amiodarone loading with plans for another attempted cardioversion in 8 to 12 weeks.  Xarelto at renal dose was also added.  TEE done March 27 showed his ejection fraction to be 60 to 65% with mild left atrial enlargement.  His serum creatinine on admission was 4.6, at discharge 3.14.  He was seen in f/u as a virtual visit in April and was stable.  The plan was for cardioversion when able (not being done secondary to COVID).  Before this could be done he was again admitted 05/23/2019 with respirator failure. During that hospitalization he had Rt thoracentesis -1.8L.   He had an AVF placed and is now on HD.  He was discharged 06/15/2019.  He saw Dr Melvyn Novas in f/u 06/28/2019.  The patient is unable to tolerate Bi Pap or C-pap.  O2 goal is 89-92%.   He is in the office today for follow up.  He says since he started dialysis he thinks his overall status is more stable.  He is unaware of tachycardia.  He continues with Amiodarone 200 mg daily, Zebeta 5 mg daily, and Eliquis 2.5 mg BID.  His EKG today shows    Past Medical History:  Diagnosis Date  . Alcohol abuse   . Colon cancer ([place])   .  COPD (chronic obstructive pulmonary disease) ([place])   . Emphysema of lung ([place])   . ESRD (end stage renal disease) ([place])   . Former tobacco use   . Hypertension   . Peripheral edema   . Sepsis ([place]) 10/2016   Aspiration PNA/C diff colitis    Past Surgical History:  Procedure Laterality Date  . AV FISTULA PLACEMENT Left 06/14/2019   Procedure: Creation of Left arm arteriovenous fistula;  Surgeon: Rosetta Posner, MD;  Location: [place];  Service: Vascular;  Laterality: Left;  . CARDIOVERSION N/A 03/16/2019   Procedure: CARDIOVERSION;  Surgeon: Pixie Casino, MD;  Location[facility] ENDOSCOPY;  Service: Cardiovascular;  Laterality: N/A;  . CARDIOVERSION N/A 03/23/2019   Procedure: CARDIOVERSION;  Surgeon: Lelon Perla, MD;  Location: [facility] ENDOSCOPY;  Service: Cardiovascular;  Laterality: N/A;  . CATARACT EXTRACTION    . COLON RESECTION    . COLONOSCOPY    . IMPLANTATION BONE ANCHORED HEARING AID Left 2016  . IR FLUORO GUIDE CV LINE RIGHT  06/09/2019  . IR THORACENTESIS ASP PLEURAL SPACE W/IMG GUIDE  05/21/2019  . IR US GUIDE VASC ACCESS RIGHT  06/09/2019  . MINOR HEMORRHOIDECTOMY  1995  . TEE WITHOUT CARDIOVERSION N/A 03/16/2019   Procedure: TRANSESOPHAGEAL ECHOCARDIOGRAM (TEE);  Surgeon: Pixie Casino, MD;  Location: [place];  Service: Cardiovascular;  Laterality: N/A;  . TONSILLECTOMY  Current Medications: Current Meds  Medication Sig  . albuterol (PROVENTIL) (2.5 MG/3ML) 0.083% nebulizer solution Take 3 mLs (2.5 mg total) by nebulization every 4 (four) hours as needed for wheezing or shortness of breath.  . Albuterol Sulfate (PROAIR RESPICLICK) 782 (90 Base) MCG/ACT AEPB Inhale 1-2 puffs into the lungs every 4 (four) hours as needed.  Marland Kitchen allopurinol (ZYLOPRIM) 100 MG tablet Take 100 mg by mouth daily.  Marland Kitchen amiodarone (PACERONE) 200 MG tablet Take 1 tablet (200 mg total) by mouth daily.  Marland Kitchen apixaban (ELIQUIS) 2.5 MG TABS tablet Take 1 tablet (2.5 mg total) by mouth 2 (two) times  daily.  . bisoprolol (ZEBETA) 5 MG tablet Take 5 mg by mouth daily.  . Darbepoetin Alfa (ARANESP) 150 MCG/0.3ML SOSY injection Inject 0.3 mLs (150 mcg total) into the vein every Saturday with hemodialysis.  . ferrous sulfate 325 (65 FE) MG tablet Take 325 mg by mouth daily.   . Fluticasone-Umeclidin-Vilant (TRELEGY ELLIPTA) 100-62.5-25 MCG/INH AEPB Inhale 1 puff into the lungs every morning.  . furosemide (LASIX) 40 MG tablet Take 1 tablet (40 mg total) by mouth 2 (two) times daily.  . multivitamin (RENA-VIT) TABS tablet Take 1 tablet by mouth at bedtime.  . OXYGEN Inhale 4 L into the lungs continuous.  . sevelamer carbonate (RENVELA) 800 MG tablet Take 1 tablet (800 mg total) by mouth 3 (three) times daily with meals.  . traZODone (DESYREL) 50 MG tablet Take 0.5-1 tablets (25-50 mg total) by mouth at bedtime as needed for sleep.     Allergies:   Flexeril [cyclobenzaprine]   Social History   Socioeconomic History  . Marital status: Married    Spouse name: Not on file  . Number of children: 3  . Years of education: Not on file  . Highest education level: Not on file  Occupational History  . Occupation: Retired- Associate Professor  Social Needs  . Financial resource strain: Not on file  . Food insecurity    Worry: Patient refused    Inability: Patient refused  . Transportation needs    Medical: Patient refused    Non-medical: Patient refused  Tobacco Use  . Smoking status: Former Smoker    Packs/day: 1.00    Years: 20.00    Pack years: 20.00    Types: Cigarettes    Quit date: 12/20/1981    Years since quitting: 37.5  . Smokeless tobacco: Never Used  Substance and Sexual Activity  . Alcohol use: Yes    Alcohol/week: 7.0 standard drinks    Types: 7 Standard drinks or equivalent per week    Comment: states he drinks 3-4 days a week, "a couple" on those occasions but does not further quaniity.  . Drug use: No  . Sexual activity: Never  Lifestyle  . Physical activity     Days per week: Patient refused    Minutes per session: Patient refused  . Stress: Not on file  Relationships  . Social Herbalist on phone: Patient refused    Gets together: Patient refused    Attends religious service: Patient refused    Active member of club or organization: Patient refused    Attends meetings of clubs or organizations: Patient refused    Relationship status: Patient refused  Other Topics Concern  . Not on file  Social History Narrative  . Not on file     Family History: The patient's family history includes Brain cancer in his father; Colon cancer in his mother;  Lung cancer in his brother.  ROS:   Please see the history of present illness.     All other systems reviewed and are negative.  EKGs/Labs/Other Studies Reviewed:    The following studies were reviewed today: Echo June 2020  EKG:  EKG is ordered today.  The ekg ordered today demonstrates regular R-R interval, RBBB, presume NSR but poor tracing with irregular baseline.   Recent Labs: 05/04/2019: Pro B Natriuretic peptide (BNP) 443.0 05/19/2019: B Natriuretic Peptide 465.7 05/31/2019: ALT 12 06/09/2019: TSH 3.913 06/14/2019: Magnesium 1.8 06/15/2019: BUN 59; Creatinine, Ser 4.25; Hemoglobin 10.0; Platelets 210; Potassium 4.8; Sodium 137  Recent Lipid Panel    Component Value Date/Time   CHOL 106 11/10/2016 0331   TRIG 100 03/05/2019 0539   HDL 60 11/10/2016 0331   CHOLHDL 1.8 11/10/2016 0331   VLDL 17 11/10/2016 0331   LDLCALC 29 11/10/2016 0331    Physical Exam:    VS:  BP 133/79   Pulse (!) 109   Temp (!) 94.1 F (34.5 C)   Ht 5\' 10"  (1.778 m)   Wt 201 lb (91.2 kg)   SpO2 98%   BMI 28.84 kg/m     Wt Readings from Last 3 Encounters:  07/11/19 201 lb (91.2 kg)  06/27/19 208 lb 12.8 oz (94.7 kg)  06/15/19 217 lb 2.5 oz (98.5 kg)     GEN: Chronically ill appearing male, well developed in no acute distress HEENT: Normal NECK: No JVD; No carotid bruits LYMPHATICS: No  lymphadenopathy CARDIAC: RRR, no murmurs, rubs, gallops, increased rate RESPIRATORY:  Decreased breath sounds overall c/w COPD.  Decreased breath sounds at bases with dullness to percussion.  ABDOMEN: Soft, non-tender, non-distended MUSCULOSKELETAL:  No edema; No deformity  SKIN: Warm and dry NEUROLOGIC:  Alert and oriented x 3 PSYCHIATRIC:  Normal affect   ASSESSMENT:    Acute on chronic combined systolic and diastolic heart failure ([place]) Most recent echo June 2020 showed an EF of 45-50% with moderate LAE Volume now controlled with HD  ESRD on dialysis [facility]) HD T Th Sat  Atrial fibrillation ([place]) New onset 02/2019 in setting of CAP , respiratory failure, septic shock, AKI. Failed cardioversion twice during March admission- Amio added then with plans to attempt eventual DCCV (delayed secondary to COPD, and COVID).  Meanwhile patient admitted again with respiratory failure in June 2020.  He appears to be in NSR today (poor EKG tracing).  LFTs and TSH WNL June 2020.  Chronic anticoagulation Low dose Eliquis  Chronic respiratory failure with hypoxia and hypercapnia ([name]) On chronic O2, unable to tolerate BiPap or C-pap.  O2 goal 89-92%  Essential hypertension Controlled  Pleural effusion on right R throaceneteis 05/21/19   =  1.85 l slt bloody exudative, nl glucose, nl cells on path review Repeat 06/02/19 x 1.4 liters Repeat 6/21 x 2.3 liters dark red  cyt neg   PLAN:    No change in Rx.  F/U with Dr Gwenlyn Found in 3 months.    Medication Adjustments/Labs and Tests Ordered: Current medicines are reviewed at length with the patient today.  Concerns regarding medicines are outlined above.  No orders of the defined types were placed in this encounter.  No orders of the defined types were placed in this encounter.   There are no Patient Instructions on file for this visit.   Signed, Kerin Ransom, PA-C  07/11/2019 10:20 AM     [facility]

## 2019-07-16 ENCOUNTER — Telehealth (HOSPITAL_COMMUNITY): Payer: Self-pay

## 2019-07-16 NOTE — Telephone Encounter (Signed)

## 2019-07-17 ENCOUNTER — Ambulatory Visit (INDEPENDENT_AMBULATORY_CARE_PROVIDER_SITE_OTHER): Payer: Medicare Other | Admitting: Vascular Surgery

## 2019-07-17 ENCOUNTER — Other Ambulatory Visit: Payer: Self-pay

## 2019-07-17 ENCOUNTER — Ambulatory Visit (HOSPITAL_COMMUNITY)
Admission: RE | Admit: 2019-07-17 | Discharge: 2019-07-17 | Disposition: A | Discharge status: Home / Self Care | Payer: Medicare Other | Source: Ambulatory Visit | Attending: Vascular Surgery | Admitting: Vascular Surgery

## 2019-07-17 ENCOUNTER — Encounter: Payer: Self-pay | Admitting: Vascular Surgery

## 2019-07-17 VITALS — BP 105/65 | HR 88 | Temp 97.5°F | Resp 20 | Ht 70.0 in | Wt 200.0 lb

## 2019-07-17 DIAGNOSIS — N186 End stage renal disease: Secondary | ICD-10-CM

## 2019-07-17 NOTE — Progress Notes (Signed)
   Patient name: David Barton MRN: [id number] DOB: [birth year] Sex: male  REASON FOR VISIT: Low up left radiocephalic AV fistula creation on 06/14/2019  HPI: David Barton is a 85 y.o. male here today for follow-up.  Is having good dialysis treatment via his right IJ tunneled catheter.  He reports no discomfort or difficulty with his left radiocephalic fistula  Current Outpatient Medications  Medication Sig Dispense Refill  . albuterol (PROVENTIL) (2.5 MG/3ML) 0.083% nebulizer solution Take 3 mLs (2.5 mg total) by nebulization every 4 (four) hours as needed for wheezing or shortness of breath. 75 mL 12  . Albuterol Sulfate (PROAIR RESPICLICK) 195 (90 Base) MCG/ACT AEPB Inhale 1-2 puffs into the lungs every 4 (four) hours as needed.    Marland Kitchen allopurinol (ZYLOPRIM) 100 MG tablet Take 100 mg by mouth daily.    Marland Kitchen amiodarone (PACERONE) 200 MG tablet Take 1 tablet (200 mg total) by mouth daily. 90 tablet 0  . apixaban (ELIQUIS) 2.5 MG TABS tablet Take 1 tablet (2.5 mg total) by mouth 2 (two) times daily. 180 tablet 3  . bisoprolol (ZEBETA) 5 MG tablet Take 5 mg by mouth daily.    . Darbepoetin Alfa (ARANESP) 150 MCG/0.3ML SOSY injection Inject 0.3 mLs (150 mcg total) into the vein every Saturday with hemodialysis. 1.68 mL 0  . ferrous sulfate 325 (65 FE) MG tablet Take 325 mg by mouth daily.     . Fluticasone-Umeclidin-Vilant (TRELEGY ELLIPTA) 100-62.5-25 MCG/INH AEPB Inhale 1 puff into the lungs every morning. 1 each 11  . furosemide (LASIX) 40 MG tablet Take 1 tablet (40 mg total) by mouth 2 (two) times daily. 60 tablet 0  . multivitamin (RENA-VIT) TABS tablet Take 1 tablet by mouth at bedtime. 90 tablet 0  . OXYGEN Inhale 4 L into the lungs continuous.    . sevelamer carbonate (RENVELA) 800 MG tablet Take 1 tablet (800 mg total) by mouth 3 (three) times daily with meals. 270 tablet 0  . traZODone (DESYREL) 50 MG tablet Take 0.5-1 tablets (25-50 mg total) by mouth  at bedtime as needed for sleep. 60 tablet 0   No current facility-administered medications for this visit.      PHYSICAL EXAM: Vitals:   07/17/19 1429  BP: 105/65  Pulse: 88  Resp: 20  Temp: (!) 97.5 F (36.4 C)  SpO2: 100%  Weight: 200 lb (90.7 kg)  Height: 5\' 10"  (1.778 m)    GENERAL: The patient is a well-nourished male, in no acute distress. The vital signs are documented above. Good healing of the incision in his left radiocephalic fistula.  Excellent thrill.  He has a very superficial location of his vein with nice size  Duplex today reveals 5 to 6 mm throughout his left AV fistula  MEDICAL ISSUES: Excellent result at 1 month of left AV fistula creation.  We will continue to use his catheter for 2 months and then begin to use his fistula at its 38-month duration.  Can then remove tunneled catheter.  He will see [place] on an as-needed basis   Rosetta Posner, MD [facility] Tel [phone] Pager [phone]

## 2019-07-27 ENCOUNTER — Ambulatory Visit: Payer: Medicare Other | Admitting: Internal Medicine

## 2019-08-03 ENCOUNTER — Ambulatory Visit (INDEPENDENT_AMBULATORY_CARE_PROVIDER_SITE_OTHER): Payer: Medicare Other

## 2019-08-03 ENCOUNTER — Encounter: Payer: Self-pay | Admitting: Internal Medicine

## 2019-08-03 ENCOUNTER — Ambulatory Visit: Payer: Medicare Other | Admitting: Internal Medicine

## 2019-08-03 ENCOUNTER — Other Ambulatory Visit: Payer: Self-pay

## 2019-08-03 VITALS — BP 126/74 | HR 111 | Temp 97.6°F | Ht 70.0 in | Wt 195.0 lb

## 2019-08-03 DIAGNOSIS — J449 Chronic obstructive pulmonary disease, unspecified: Secondary | ICD-10-CM | POA: Diagnosis not present

## 2019-08-03 DIAGNOSIS — J9611 Chronic respiratory failure with hypoxia: Secondary | ICD-10-CM | POA: Diagnosis not present

## 2019-08-03 DIAGNOSIS — J9612 Chronic respiratory failure with hypercapnia: Secondary | ICD-10-CM | POA: Diagnosis not present

## 2019-08-03 DIAGNOSIS — J90 Pleural effusion, not elsewhere classified: Secondary | ICD-10-CM

## 2019-08-03 NOTE — Patient Instructions (Signed)
The 02 saturation should at peak exercise once or twice a week with goal of keeping over 90%   Continue 3lpm at bedtime   Please schedule a follow up visit in 3 months but call sooner if needed with cxr on return

## 2019-08-03 NOTE — Progress Notes (Signed)
David Barton, male    DOB: Feb 07, 1933,    MRN: [id number]   Brief patient profile:  89 yowm  Quit smoking 1983  At wt 180 with baseline able to walk the dog 10 minutes slow pace but  avoided steps maint on spiriva with GOLD II COPD criteria 07/16/13     History of Present Illness  04/24/2019  Pulmonary/  office eval/David Barton / 02 4lpm 24/7 / persistent afib and started amiodarone since last ov Chief Complaint  Patient presents with  . Pulmonary Consult    Referred by Kerin Ransom, PA. Pt c/o SOB since March 2020. Pt c/o DOE with walking from room to room.  He has had some cough with clear to bloody sputum.     Dyspnea:  Gradually losing ground now Room to room gives out due to sob even on 4lpm  Cough: more bloody/ no pain with cough Sleep: on flat bed, 3 pillows on back SABA WUJ:[id number] helps the most  rec Stop spiriva and take the  duoneb up to 4 x in 24 hours as you feel you need it if it helps your shortness of breath For cough/congestion >  mucinex or mucinex dm up to 1200 mg every 12 hours and use flutter valve  as much as you can  Humidify 02 as much of the time as your can  Add:  cxr worse >>>  rx levaquin 500 x 7 days     05/04/2019  f/u ov/David Barton re: copd/ pna vs alv hem vs amio effects/ main concern now is weak Dyspnea:  Same = across the room, even on 02  Cough: dry now day > noct  Sleeping: flat bed/ 1  Pillow  s resp symptoms SABA use: just the duoneb qid 02: 4lpm and sats ok x with walking sometimes drop into 80s  rec Please remember to go to the lab department   for your tests - we will call you with the results when they are available.  No change in recommendations  Please schedule a follow up office visit in 4 weeks, sooner if needed with cxr  Additional recs p reviewed studies 1) adjust 02 to low 90's at rest and with activity  2) u/s  thoracentesis     Admit date: 05/19/2019 Discharge date: 05/30/2019  Time spent: 35 minutes  Recommendations for Outpatient  Follow-up:  1. Continue physical therapy at inpatient rehab.  Discharge Diagnoses:       Active Hospital Problems   Diagnosis Date Noted  . Acute on chronic respiratory failure ([place]) 05/19/2019  . HCAP (healthcare-associated pneumonia) 04/25/2019  . Chronic respiratory failure with hypoxia ([place]) 04/25/2019  . Atrial fibrillation with RVR ([place]) 03/11/2019  . AKI (acute kidney injury) ([place]) 11/04/2016    Resolved Hospital Problems   Diagnosis Date Noted Date Resolved  . Community acquired pneumonia of right lung 11/03/2016 05/21/2019    Discharge Condition: Stable  Diet recommendation: Resume previous diet, heart healthy diet.      Vitals:   05/30/19 0821 05/30/19 0831  BP:    Pulse:    Resp:    Temp:    SpO2: 94% 93%    History of present illness:  85 year old male with PMH of COPD, chronic hypoxic respiratory failure on home oxygen at 4 L/min, chronic systolic CHF with EF 78% in 2017 which subsequently normalized, A. fib/PSVT with history of TEE cardioversion, HTN, stage IV CKD, alcohol abuse, colon cancer who initially presented with chest pain and dyspnea. Recent  prolonged hospitalization in March/April for pneumonia, septic shock complicated by A. fib with RVR and worsening renal failure. In ED developed progressive hypoxia, placed on BiPAP which patient not fully compliant with, DNR, CT chest 5/31 showed new right-sided consolidation with right moderate pleural effusion, high PCT, suspecting H CAP, status post thoracentesis 05/21/2019. Cardiology consulted 6/3. Patient wasmedically stable for discharge since 6/5 at which point he was waiting for insurance approval for CIR which was initially denied on 6/8 but approved after peer to peer MD discussion on 6/9. Overnight 6/8, hypoxic in the 40s requiring NRBM>Ventimask, pulmonary reconsulted and will try nocturnal auto titrating CPAP tonight and hopefully discharge 6/10 to CIR with CPAP.  05/30/19: Patient  seen and examined at his bedside.  Declined CPAP at night due to claustrophobia.  Tolerated Ventimask.  No new concerns.  Labs reviewed and are stable.  On the day of discharge, the patient was hemodynamically stable.  He will need to continue physical therapy at inpatient rehab.  Hospital Course:  Principal Problem:   Acute on chronic respiratory failure ([name]) Active Problems:   AKI (acute kidney injury) ([place])   Atrial fibrillation with RVR ([name])   HCAP (healthcare-associated pneumonia)   Chronic respiratory failure with hypoxia ([name])  Acute on chronic hypoxic respiratory failure -Likely multifactorial secondary to HCAP and right-sided pleural effusion. He may have underlying undiagnosed sleep apnea. -Has completed course of antibiotics (IV cefepime followed by oral Augmentin). - Blood cultures x2: Negative to date. - SARS coronavirus 2: Negative. - CT chest showed worsening right sided consolidation and effusion and hence underwent thoracentesis, please see discussion below. -As per speech therapy, on regular diet. - Acetylcholine receptor antibody resultsnegative. - As per update from rehab team who evaluated him during prior hospitalization, prior to recent hospitalization, he was independent and used oxygen 2 L/min. After recent hospitalization, CIR was denied, returned home, ambulating with the help of a walker and was walking up to 50 yards and on oxygen 4 L/min. -Target for oxygen saturation between 89-92%. -Patient declining or unable to tolerate BiPAP more than a short time. -Weaned to oxygen 3 L/min at daytime. -Ongoing issues with nocturnal hypoxemia. -Overnight 6/8, hypoxic in the 40s requiring NRBM>Ventimask, pulmonary reconsulted and will try nocturnal auto titrating CPAP tonight  -Patient declines CPAP due to claustrophobia. -Continue with current oxygen prescription in CIR as recommended by pulmonology.  Suspected right lower lobe HCAP versus likely  aspiration pneumonia -Completed course of antibiotics -Negative swallow evaluation. -Recommend repeating CT chest in 4 to 6 weeks and outpatient follow-up arranged arranged by pulmonology.  Right-sided parapneumonic pleural effusion -Status post ultrasound-guided thoracentesis by IR on 6/1: Total of 1.85 L of dark red fluid was removed. - Pleural fluid: WBCs 1463, 82% neutrophils, protein 3.2, LDH 140, culture negative to date. Reactive mesothelial cells and macrophages present, no atypia seen. -Suspected exudative? Parapneumonic. -Completed antibiotic course -Chest x-ray 6/3 without sizable effusion. Improved. -Improved and stable -Independently reviewed chest x-ray done on 05/23/2019 which showed bibasilar infiltrates right greater than left.  Completed course of antibiotics.  Acute on stage IV chronic kidney disease -Baseline creatinine reportedly approximately 2.5. -On 6/1 creatinine up to 3.7. Lasix discontinued. Creatinine continues to gradually improve, down to 3.12 today. -Patient absolutely does not want dialysis. -Periodically follow BMP after discharge.  Acute systolic CHF/LVEF 69-62%/[id number]. -Last EF was 60-65%. -Clinically appears euvolemic.Continue Lasix 40 mg twice daily. -Not a candidate for ACEI/ARB/Aldactone due to CKD. CHF not felt to be primary etiology for patient's pleural  effusion. -Improved and stable. Continue current dose of Lasix and Zebeta.  A. fib/flutter with RVR -Failed TEE cardioversion on 03/16/2019. -Remains on amiodarone and Zebeta. -Cardizem discontinued -As per discussion with cardiology, cardioversion may be less likely to be successful until primary pulmonary processes addressed.  -IV heparin discontinued and started Eliquis due to chronic kidney disease. - Op f/u with Dr. Quay Burow, Cardiology 6/17  Elevated TSH -Likely sick euthyroid. Follow TSH in 4 weeks.  Gout -No acute flare presently. Continue allopurinol  prophylaxis.  Morbid obesity/Body mass index is 32.11 kg/m.  Likely undiagnosed OSA Management as discussed above. Trial of CPAP with auto titration tonight.  Macrocytic anemia Stable. Received IV iron. Continue oral iron. Patient reports that he he has an outpatient appointment to come to [facility] on 6/11 for IV iron by his Nephrologist. This can bedone at CIR.  Physical deconditioning -Continue physical therapy at CIR.      06/27/2019  f/u ov/David Barton re:  S/p thoracentesis/ HD initiation on anoro and pulmocort Chief Complaint  Patient presents with  . Follow-up    Breathing is "ok" no new co's.   Dyspnea:  Room to room at home / on 3lpm  Cough: none Sleeping: flat in bed / two pillows  02: 3lpm 24/7  rec Plan A = Automatic = Trelegy one click - take two good drags  Plan B = Backup Only use your albuterol (proair resplick) as a rescue medication Plan C = Crisis - only use your albuterol nebulizer if you first try Plan B and it fails to help > ok to use the nebulizer up to every 4 hours but if start needing it regularly call for immediate appointment Stay on 3lpm at bedtime but ok to adjust during the day to keep your saturations over 90%     08/03/2019  f/u ov/David Barton re: f/u effusions / copd doing better on trelegy  Chief Complaint  Patient presents with  . Follow-up    CXR repeated. Breathing has improved. He has not been using his albuterol inhaler or neb.   Dyspnea:  Says can walk "1000 yards" ( I believe he means feet)  off 02 but does not check level of  Treadmill x 5 min tired x 1.2 mph no grade at all, also does not check sats of 02  Cough: none  Sleeping: flat in bed, 2 pillows SABA use: none  02: 3lpm bedtime , not at all during the day   No obvious day to day or daytime variability or assoc excess/ purulent sputum or mucus plugs or hemoptysis or cp or chest tightness, subjective wheeze or overt sinus or hb symptoms.   Sleeping  without nocturnal  or early  am exacerbation  of respiratory  c/o's or need for noct saba. Also denies any obvious fluctuation of symptoms with weather or environmental changes or other aggravating or alleviating factors except as outlined above   No unusual exposure hx or h/o childhood pna/ asthma or knowledge of premature birth.  Current Allergies, Complete Past Medical History, Past Surgical History, Family History, and Social History were reviewed in [employer] record.  ROS  The following are not active complaints unless bolded Hoarseness, sore throat, dysphagia, dental problems, itching, sneezing,  nasal congestion or discharge of excess mucus or purulent secretions, ear ache,   fever, chills, sweats, unintended wt loss or wt gain, classically pleuritic or exertional cp,  orthopnea pnd or arm/hand swelling  or leg swelling, presyncope, palpitations, abdominal pain, anorexia,  nausea, vomiting, diarrhea  or change in bowel habits or change in bladder habits, change in stools or change in urine, dysuria, hematuria,  rash, arthralgias, visual complaints, headache, numbness, weakness or ataxia or problems with walking or coordination,  change in mood or  memory.        Current Meds  Medication Sig  . albuterol (PROVENTIL) (2.5 MG/3ML) 0.083% nebulizer solution Take 3 mLs (2.5 mg total) by nebulization every 4 (four) hours as needed for wheezing or shortness of breath.  . Albuterol Sulfate (PROAIR RESPICLICK) 573 (90 Base) MCG/ACT AEPB Inhale 1-2 puffs into the lungs every 4 (four) hours as needed.  Marland Kitchen allopurinol (ZYLOPRIM) 100 MG tablet Take 100 mg by mouth daily.  Marland Kitchen amiodarone (PACERONE) 200 MG tablet Take 1 tablet (200 mg total) by mouth daily.  Marland Kitchen apixaban (ELIQUIS) 2.5 MG TABS tablet Take 1 tablet (2.5 mg total) by mouth 2 (two) times daily.  . Darbepoetin Alfa (ARANESP) 150 MCG/0.3ML SOSY injection Inject 0.3 mLs (150 mcg total) into the vein every Saturday with hemodialysis.  . ferrous sulfate 325  (65 FE) MG tablet Take 325 mg by mouth daily.   . Fluticasone-Umeclidin-Vilant (TRELEGY ELLIPTA) 100-62.5-25 MCG/INH AEPB Inhale 1 puff into the lungs every morning.  . multivitamin (RENA-VIT) TABS tablet Take 1 tablet by mouth at bedtime.  . OXYGEN Inhale 4 L into the lungs continuous.  . sevelamer carbonate (RENVELA) 800 MG tablet Take 1 tablet (800 mg total) by mouth 3 (three) times daily with meals.  Marland Kitchen zolpidem (AMBIEN) 10 MG tablet Take 1 tablet by mouth at bedtime as needed.                  Objective:      08/03/2019       195   06/27/2019         208   04/24/19 231 lb (104.8 kg)  04/17/19 225 lb (102.1 kg)  03/26/19 226 lb 3.1 oz (102.6 kg)     Vital signs reviewed - Note on arrival 02 sats  94% on RA       amb wm slt hoarse, nad    HEENT: nl dentition, turbinates bilaterally, and oropharynx. Nl external ear canals without cough reflex   NECK :  without JVD/Nodes/TM/ nl carotid upstrokes bilaterally   LUNGS: no acc muscle use,  Nl contour chest with decreased bs/ dullness just in bases  without cough on insp or exp maneuvers   CV:  slt irreg S1S   no s3 or murmur or increase in P2, and no edema   ABD:  soft and nontender with nl inspiratory excursion in the supine position. No bruits or organomegaly appreciated, bowel sounds nl  MS:  Nl gait/ ext warm without deformities, calf tenderness, cyanosis or clubbing No obvious joint restrictions   SKIN: warm and dry without lesions    NEURO:  alert, approp, nl sensorium with  no motor or cerebellar deficits apparent.                CXR PA and Lateral:   08/03/2019 :    I personally reviewed images and agree with radiology impression as follows:   Stable small bilateral pleural effusions. No new lung findings.  Heart size and mediastinal contours are stable. Dialysis catheter appears adequately positioned with tip at the level of the lower SVC/cavoatrial junction. No acute appearing osseous abnormality.  My  review:  Effusions are clearly smaller vs priors      Assessment

## 2019-08-05 ENCOUNTER — Encounter: Payer: Self-pay | Admitting: Internal Medicine

## 2019-08-05 NOTE — Assessment & Plan Note (Addendum)
Onset 02/2019 secondary to cap, chf/ copd HC03 05/04/2019 = 32  HCO3  06/14/19   = 25   hypercapneic component better and just using 02 3lpm at hs   >>> rec monitor sats with peak activity to assure maintain over 90%

## 2019-08-05 NOTE — Assessment & Plan Note (Signed)
Quit smoking 1983  - PFTs 07/16/2013  FEV1  1.75 (61%) ratio 52 and 15% better p B2, DLCO 74% - 06/27/2019  After extensive coaching inhaler device,  effectiveness =    90% with dpi so changed to trelegy maint and proair respiclick> improved as of ov 08/03/2019    Group D in terms of symptom/risk and laba/lama/ICS  therefore appropriate rx at this point  >>>  Continue trelegy

## 2019-08-05 NOTE — Assessment & Plan Note (Addendum)
R throaceneteis 05/21/19   =  1.85 l slt bloody exudative, nl glucose, nl cells on path review Repeat 06/02/19 x 1.4 liters Repeat 6/21/0  x 2.3 liters dark red  cyt neg   Both R and L are improving with HD w/o further tap > no need for further taps  >>> f/u 3 m with cxr, call sooner if needed    I had an extended discussion with the patient reviewing all relevant studies completed to date and  lasting 15 to 20 minutes of a 25 minute visit    Each maintenance medication was reviewed in detail including most importantly the difference between maintenance and prns and under what circumstances the prns are to be triggered using an action plan format that is not reflected in the computer generated alphabetically organized AVS.     Please see AVS for specific instructions unique to this visit that I personally wrote and verbalized to the the pt in detail and then reviewed with pt  by my nurse highlighting any  changes in therapy recommended at today's visit to their plan of care.

## 2019-08-20 ENCOUNTER — Emergency Department (HOSPITAL_COMMUNITY): Payer: Medicare Other

## 2019-08-20 ENCOUNTER — Other Ambulatory Visit: Payer: Self-pay

## 2019-08-20 ENCOUNTER — Emergency Department (HOSPITAL_BASED_OUTPATIENT_CLINIC_OR_DEPARTMENT_OTHER): Payer: Medicare Other

## 2019-08-20 ENCOUNTER — Telehealth: Payer: Self-pay | Admitting: Internal Medicine

## 2019-08-20 ENCOUNTER — Emergency Department (HOSPITAL_COMMUNITY)
Admission: EM | Admit: 2019-08-20 | Discharge: 2019-08-20 | Disposition: A | Discharge status: Home / Self Care | Payer: Medicare Other | Attending: Emergency Medicine | Admitting: Emergency Medicine

## 2019-08-20 ENCOUNTER — Encounter (HOSPITAL_COMMUNITY): Payer: Self-pay

## 2019-08-20 DIAGNOSIS — Z87891 Personal history of nicotine dependence: Secondary | ICD-10-CM | POA: Diagnosis not present

## 2019-08-20 DIAGNOSIS — Z79899 Other long term (current) drug therapy: Secondary | ICD-10-CM | POA: Diagnosis not present

## 2019-08-20 DIAGNOSIS — N186 End stage renal disease: Secondary | ICD-10-CM | POA: Diagnosis not present

## 2019-08-20 DIAGNOSIS — Z7901 Long term (current) use of anticoagulants: Secondary | ICD-10-CM | POA: Diagnosis not present

## 2019-08-20 DIAGNOSIS — I5042 Chronic combined systolic (congestive) and diastolic (congestive) heart failure: Secondary | ICD-10-CM | POA: Insufficient documentation

## 2019-08-20 DIAGNOSIS — R0602 Shortness of breath: Secondary | ICD-10-CM | POA: Insufficient documentation

## 2019-08-20 DIAGNOSIS — Z20828 Contact with and (suspected) exposure to other viral communicable diseases: Secondary | ICD-10-CM | POA: Diagnosis not present

## 2019-08-20 DIAGNOSIS — I132 Hypertensive heart and chronic kidney disease with heart failure and with stage 5 chronic kidney disease, or end stage renal disease: Secondary | ICD-10-CM | POA: Insufficient documentation

## 2019-08-20 DIAGNOSIS — J449 Chronic obstructive pulmonary disease, unspecified: Secondary | ICD-10-CM | POA: Diagnosis not present

## 2019-08-20 DIAGNOSIS — R0789 Other chest pain: Secondary | ICD-10-CM | POA: Diagnosis present

## 2019-08-20 DIAGNOSIS — R609 Edema, unspecified: Secondary | ICD-10-CM

## 2019-08-20 LAB — CBC
HCT: 39.1 % (ref 39.0–52.0)
Hemoglobin: 12 g/dL — ABNORMAL LOW (ref 13.0–17.0)
MCH: 30.1 pg (ref 26.0–34.0)
MCHC: 30.7 g/dL (ref 30.0–36.0)
MCV: 98 fL (ref 80.0–100.0)
Platelets: 263 10*3/uL (ref 150–400)
RBC: 3.99 MIL/uL — ABNORMAL LOW (ref 4.22–5.81)
RDW: 16.2 % — ABNORMAL HIGH (ref 11.5–15.5)
WBC: 12.6 10*3/uL — ABNORMAL HIGH (ref 4.0–10.5)
nRBC: 0 % (ref 0.0–0.2)

## 2019-08-20 LAB — BASIC METABOLIC PANEL
Anion gap: 15 (ref 5–15)
BUN: 52 mg/dL — ABNORMAL HIGH (ref 8–23)
CO2: 25 mmol/L (ref 22–32)
Calcium: 9.1 mg/dL (ref 8.9–10.3)
Chloride: 91 mmol/L — ABNORMAL LOW (ref 98–111)
Creatinine, Ser: 5.13 mg/dL — ABNORMAL HIGH (ref 0.61–1.24)
GFR calc Af Amer: 11 mL/min — ABNORMAL LOW (ref >60)
GFR calc non Af Amer: 10 mL/min — ABNORMAL LOW (ref >60)
Glucose, Bld: 148 mg/dL — ABNORMAL HIGH (ref 70–99)
Potassium: 3.9 mmol/L (ref 3.5–5.1)
Sodium: 131 mmol/L — ABNORMAL LOW (ref 135–145)

## 2019-08-20 LAB — TROPONIN I (HIGH SENSITIVITY)
Troponin I (High Sensitivity): 16 ng/L (ref <18)
Troponin I (High Sensitivity): 16 ng/L (ref <18)

## 2019-08-20 LAB — SARS CORONAVIRUS 2 BY RT PCR (HOSPITAL ORDER, PERFORMED IN ~~LOC~~ HOSPITAL LAB): SARS Coronavirus 2: NEGATIVE

## 2019-08-20 MED ORDER — TECHNETIUM TC 99M DIETHYLENETRIAME-PENTAACETIC ACID
27.3000 | Freq: Once | INTRAVENOUS | Status: AC | PRN
  Administered 2019-08-20: 22:00:00 27.3 via RESPIRATORY_TRACT

## 2019-08-20 MED ORDER — SODIUM CHLORIDE 0.9% FLUSH
3.0000 mL | Freq: Once | INTRAVENOUS | Status: AC
  Administered 2019-08-20: 3 mL via INTRAVENOUS

## 2019-08-20 MED ORDER — TECHNETIUM TO 99M ALBUMIN AGGREGATED
1.5300 | Freq: Once | INTRAVENOUS | Status: AC | PRN
  Administered 2019-08-20: 21:00:00 1.53 via INTRAVENOUS

## 2019-08-20 NOTE — Telephone Encounter (Signed)
Primary Pulmonologist: MW Last office visit and with whom: 08/03/2019 w/ MW What do we see them for (pulmonary problems): Pleural effusion on right, COPD Gold II, Chronic respiratory failure with hypoxia and hypercapnia  Reason for call: Pt states his breathing was stable until Saturday 08/18/2019, when he started getting pain in his left side in the lung area. Pt describes it as a "steady" pain when inhaling 8/10. He states he just went back on O2 1L this morning after 2 weeks of being off O2. He suspects he may have gotten dehydrated, but states he is unsure where this pain is coming from. I had pt take his SpO2 over the phone and pt reported 93% on O2 1L while resting. Pt denies new onset shortness of breath. Denies fever/chills/body aches. Denies cough, chest pain/chest tightness. Pt states he has been taking Trelegy one puff daily and that he's been blowing into a balloon up 2-3 times daily to strengthen lungs. Pt is inquiring if he needs another CXR.   In the last month, have you been in contact with someone who was confirmed or suspected to have Con[place]avirus / COVID-19?  No  Do you have any of the following symptoms developed in the last 30 days? Fever: No Cough: No Shortness of breath: No  When did your symptoms start?  Saturday 08/18/2019  If the patient has a fever, what is the last reading?  (use n/a if patient denies fever)  N/A . IF THE PATIENT STATES THEY DO NOT OWN A THERMOMETER, THEY MUST GO AND PURCHASE ONE When did the fever start?: N/A Have you taken any medication to suppress a fever (ie Ibuprofen, Aleve, Tylenol)?: N/A  Routing to MW for follow up. MW, please advise with your recommendations for this pt. Thank you.

## 2019-08-20 NOTE — Telephone Encounter (Signed)
Call returned to patient, made aware of MW recommendations. He states he is going to call his wife and get her to take him to ER. Nothing further needed at this time.

## 2019-08-20 NOTE — Progress Notes (Signed)
Lower extremity venous has been completed.   Preliminary results in CV Proc.   Abram Sander 08/20/2019 6:01 PM

## 2019-08-20 NOTE — Telephone Encounter (Signed)
Pt calling again.

## 2019-08-20 NOTE — Discharge Instructions (Signed)
As discussed, your evaluation today has been largely reassuring.  But, it is important that you monitor your condition carefully, and do not hesitate to return to the ED if you develop new, or concerning changes in your condition. ? ?Otherwise, please follow-up with your physician for appropriate ongoing care. ? ?

## 2019-08-20 NOTE — ED Provider Notes (Signed)
[facility] EMERGENCY DEPARTMENT Provider Note   CSN: [id number] Arrival date & time: 08/20/19  1405     History   Chief Complaint Chief Complaint  Patient presents with   Chest Pain    HPI David Barton is a 85 y.o. male.     85 yo M with a chief complaints of left-sided chest pain and shortness of breath.  This was noticed today.  Patient had recently had a prolonged stay in the hospital and had required oxygen.  They have titrated him off of oxygen at home.  He typically is somewhere between 88 to 92% at rest and usually improves on exertion.  However today his oxygen saturation was lower and with the chest pain they called his pulmonologist who felt this was concerning for a pulmonary embolism and sent him to the ED for rule out.  The patient is chronically on anticoagulation.  Was on Xarelto until about 2 months ago and transitioned to Eliquis.  States he has been compliant with his medication and not missed any doses.  Denies any unilateral lower extremity edema.  Denies hemoptysis.  Denies cough congestion or fever.  Denies trauma to the chest.  The history is provided by the patient.  Chest Pain Pain location:  L chest Pain quality: sharp   Pain radiates to:  Does not radiate Pain severity:  Moderate Onset quality:  Gradual Duration:  2 days Timing:  Constant Progression:  Worsening Chronicity:  New Relieved by:  Nothing Worsened by:  Nothing Ineffective treatments:  None tried Associated symptoms: shortness of breath   Associated symptoms: no abdominal pain, no fever, no headache, no palpitations and no vomiting     Past Medical History:  Diagnosis Date   Alcohol abuse    Colon cancer ([place])    COPD (chronic obstructive pulmonary disease) ([place])    Emphysema of lung ([place])    ESRD (end stage renal disease) ([place])    Former tobacco use    Hypertension    Peripheral edema    Sepsis ([place]) 10/2016   Aspiration PNA/C diff colitis     Patient Active Problem List   Diagnosis Date Noted   Chronic anticoagulation 07/11/2019   Chronic bilateral pleural effusions  R>L 06/28/2019   Macrocytic anemia 06/07/2019   Lung infiltrate    Shortness of breath    DNR (do not resuscitate)    Palliative care by specialist    S/P thoracentesis    SOB (shortness of breath)    Leukocytosis    ESRD (end stage renal disease) ([place])    Acute on chronic combined systolic and diastolic heart failure ([name])    COPD GOLD II spirometry but 02 dep     Weakness generalized 05/30/2019   Acute on chronic respiratory failure ([place]) 05/19/2019   Chronic respiratory failure with hypoxia and hypercapnia ([place]) 05/06/2019   DOE (dyspnea on exertion) 05/04/2019   Chronic respiratory failure with hypoxia ([place]) 04/25/2019   History of anemia due to chronic kidney disease 04/17/2019   Pressure injury of skin 03/17/2019   Atrial fibrillation ([place]) 03/11/2019   Chronic diastolic heart failure ([place]) 01/17/2019   Edema of both legs 01/02/2019   Pulmonary edema    Hypomagnesemia 11/15/2016   Sleep apnea    Sepsis ([place]) 11/10/2016   Elevated troponin 13/24/4010   Acute diastolic CHF (congestive heart failure) ([place]) 11/10/2016   Cardiomyopathy ([place]) 11/10/2016   PSVT (paroxysmal supraventricular tachycardia) ([place]) 11/10/2016   Dysphagia  Renal insufficiency    Enteritis due to Clostridium difficile    Aspiration pneumonia ([place]) 11/04/2016   Diarrhea 11/04/2016   AKI (acute kidney injury) ([place]) 11/04/2016   Hyperkalemia 11/04/2016   Essential hypertension 05/31/2013   COPD with acute exacerbation ([place]) 05/29/2013   Hypokalemia 05/08/2013   Hypotension 05/08/2013   Dizziness 05/08/2013   Hypoxia 05/08/2013   Acute respiratory failure ([place]) 05/08/2013    Past Surgical History:  Procedure Laterality Date   AV FISTULA PLACEMENT Left 06/14/2019   Procedure: Creation of Left arm arteriovenous fistula;   Surgeon: Rosetta Posner, MD;  Location: [place];  Service: Vascular;  Laterality: Left;   CARDIOVERSION N/A 03/16/2019   Procedure: CARDIOVERSION;  Surgeon: Pixie Casino, MD;  Location: [facility] ENDOSCOPY;  Service: Cardiovascular;  Laterality: N/A;   CARDIOVERSION N/A 03/23/2019   Procedure: CARDIOVERSION;  Surgeon: Lelon Perla, MD;  Location: [place];  Service: Cardiovascular;  Laterality: N/A;   CATARACT EXTRACTION     COLON RESECTION     COLONOSCOPY     IMPLANTATION BONE ANCHORED HEARING AID Left 2016   IR FLUORO GUIDE CV LINE RIGHT  06/09/2019   IR THORACENTESIS ASP PLEURAL SPACE W/IMG GUIDE  05/21/2019   IR US GUIDE VASC ACCESS RIGHT  06/09/2019   MINOR HEMORRHOIDECTOMY  1995   TEE WITHOUT CARDIOVERSION N/A 03/16/2019   Procedure: TRANSESOPHAGEAL ECHOCARDIOGRAM (TEE);  Surgeon: Pixie Casino, MD;  [facility] END[facility]COPY;  Service: Cardiovascular;  Laterality: N/A;   TONSILLECTOMY          Home Medications    Prior to Admission medications   Medication Sig Start Date End Date Taking? Authorizing Provider  albuterol (PROVENTIL) (2.5 MG/3ML) 0.083% nebulizer solution Take 3 mLs (2.5 mg total) by nebulization every 4 (four) hours as needed for wheezing or shortness of breath. 06/27/19   Tanda Rockers, MD  Albuterol Sulfate (PROAIR RESPICLICK) 433 (90 Base) MCG/ACT AEPB Inhale 1-2 puffs into the lungs every 4 (four) hours as needed. 06/27/19   Tanda Rockers, MD  allopurinol (ZYLOPRIM) 100 MG tablet Take 100 mg by mouth daily. 02/17/19   [provider]  amiodarone (PACERONE) 200 MG tablet Take 1 tablet (200 mg total) by mouth daily. 04/17/19   Erlene Quan, PA-C  apixaban (ELIQUIS) 2.5 MG TABS tablet Take 1 tablet (2.5 mg total) by mouth 2 (two) times daily. 07/11/19   Erlene Quan, PA-C  Darbepoetin Alfa (ARANESP) 150 MCG/0.3ML SOSY injection Inject 0.3 mLs (150 mcg total) into the vein every Saturday with hemodialysis. 06/16/19   Mercy Riding, MD  ferrous  sulfate 325 (65 FE) MG tablet Take 325 mg by mouth daily.     [provider]  Fluticasone-Umeclidin-Vilant (TRELEGY ELLIPTA) 100-62.5-25 MCG/INH AEPB Inhale 1 puff into the lungs every morning. 06/27/19   Tanda Rockers, MD  multivitamin (RENA-VIT) TABS tablet Take 1 tablet by mouth at bedtime. 06/15/19   Mercy Riding, MD  OXYGEN Inhale 4 L into the lungs continuous.    [provider]  sevelamer carbonate (RENVELA) 800 MG tablet Take 1 tablet (800 mg total) by mouth 3 (three) times daily with meals. 06/15/19   Mercy Riding, MD  zolpidem (AMBIEN) 10 MG tablet Take 1 tablet by mouth at bedtime as needed. 07/20/19   [provider]    Family History Family History  Problem Relation Age of Onset   Brain cancer Father    Colon cancer Mother  with mets to liver   Lung cancer Brother        was a smoker    Social History Social History   Tobacco Use   Smoking status: Former Smoker    Packs/day: 1.00    Years: 20.00    Pack years: 20.00    Types: Cigarettes    Quit date: 12/20/1981    Years since quitting: 37.6   Smokeless tobacco: Never Used  Substance Use Topics   Alcohol use: Yes    Alcohol/week: 7.0 standard drinks    Types: 7 Standard drinks or equivalent per week    Comment: states he drinks 3-4 days a week, "a couple" on those occasions but does not further quaniity.   Drug use: No     Allergies   Patient has no known allergies.   Review of Systems Review of Systems  Constitutional: Negative for chills and fever.  HENT: Negative for congestion and facial swelling.   Eyes: Negative for discharge and visual disturbance.  Respiratory: Positive for shortness of breath.   Cardiovascular: Positive for chest pain. Negative for palpitations.  Gastrointestinal: Negative for abdominal pain, diarrhea and vomiting.  Musculoskeletal: Negative for arthralgias and myalgias.  Skin: Negative for color change and rash.  Neurological: Negative  for tremors, syncope and headaches.  Psychiatric/Behavioral: Negative for confusion and dysphoric mood.     Physical Exam Updated Vital Signs BP 117/63    Pulse 80    Temp 98.4 F (36.9 C) (Oral)    Resp (!) 22    SpO2 93%   Physical Exam Vitals signs and nursing note reviewed.  Constitutional:      Appearance: He is well-developed.  HENT:     Head: Normocephalic and atraumatic.  Eyes:     Pupils: Pupils are equal, round, and reactive to light.  Neck:     Musculoskeletal: Normal range of motion and neck supple.     Vascular: No JVD.  Cardiovascular:     Rate and Rhythm: Normal rate and regular rhythm.     Heart sounds: No murmur. No friction rub. No gallop.   Pulmonary:     Effort: No respiratory distress.     Breath sounds: No wheezing.  Chest:     Chest wall: Tenderness (with palpation of the L chest wall, reproduces the patients pain) present.  Abdominal:     General: There is no distension.     Tenderness: There is no guarding or rebound.  Musculoskeletal: Normal range of motion.     Comments: L AV fistula  Skin:    Coloration: Skin is not pale.     Findings: No rash.  Neurological:     Mental Status: He is alert and oriented to person, place, and time.  Psychiatric:        Behavior: Behavior normal.      ED Treatments / Results  Labs (all labs ordered are listed, but only abnormal results are displayed) Labs Reviewed  BASIC METABOLIC PANEL - Abnormal; Notable for the following components:      Result Value   Sodium 131 (*)    Chloride 91 (*)    Glucose, Bld 148 (*)    BUN 52 (*)    Creatinine, Ser 5.13 (*)    GFR calc non Af Amer 10 (*)    GFR calc Af Amer 11 (*)    All other components within normal limits  CBC - Abnormal; Notable for the following components:   WBC 12.6 (*)  RBC 3.99 (*)    Hemoglobin 12.0 (*)    RDW 16.2 (*)    All other components within normal limits  SARS CORONAVIRUS 2 (HOSPITAL ORDER, [place]  LAB)  TROPONIN I (HIGH SENSITIVITY)  TROPONIN I (HIGH SENSITIVITY)    EKG EKG Interpretation  Date/Time:  Monday August 20 2019 14:31:28 EDT Ventricular Rate:  101 PR Interval:    QRS Duration: 164 QT Interval:  374 QTC Calculation: 484 R Axis:   -143 Text Interpretation:  Atrial fibrillation with rapid ventricular response Right bundle branch block Abnormal ECG No significant change since last tracing Confirmed by Deno Etienne [id number]) on 08/20/2019 4:54:22 PM   Radiology Dg Chest 2 View  Result Date: 08/20/2019 CLINICAL DATA:  Acute shortness of breath and chest pain EXAM: CHEST - 2 VIEW COMPARISON:  66063 1,020 and prior radiographs FINDINGS: Cardiomegaly and pulmonary vascular congestion again noted. A RIGHT IJ central venous catheter is noted with tip overlying the SUPERIOR cavoatrial junction. Small bilateral pleural effusions, LEFT-greater-than-RIGHT, and bibasilar atelectasis again noted. There is no evidence of pneumothorax or acute bony abnormality. IMPRESSION: Cardiomegaly with pulmonary vascular congestion, small bilateral pleural effusions and mild bibasilar atelectasis. Electronically Signed   By: Margarette Canada M.D.   On: 08/20/2019 14:49   Vas [place] Lower Extremity Venous (dvt) (only [facility])  Result Date: 08/20/2019  Lower Venous Study Indications: Edema.  Limitations: Poor ultrasound/tissue interface. Comparison Study: no prior Performing Technologist: Abram Sander RVS  Examination Guidelines: A complete evaluation includes B-mode imaging, spectral Doppler, color Doppler, and power Doppler as needed of all accessible portions of each vessel. Bilateral testing is considered an integral part of a complete examination. Limited examinations for reoccurring indications may be performed as noted.  +---------+---------------+---------+-----------+----------+--------------+  RIGHT     Compressibility Phasicity Spontaneity Properties Thrombus Aging   +---------+---------------+---------+-----------+----------+--------------+  CFV       Full            Yes       Yes                                    +---------+---------------+---------+-----------+----------+--------------+  SFJ       Full                                                             +---------+---------------+---------+-----------+----------+--------------+  FV Prox   Full                                                             +---------+---------------+---------+-----------+----------+--------------+  FV Mid    Full                                                             +---------+---------------+---------+-----------+----------+--------------+  FV Distal Full                                                             +---------+---------------+---------+-----------+----------+--------------+  PFV       Full                                                             +---------+---------------+---------+-----------+----------+--------------+  POP       Full            Yes       Yes                                    +---------+---------------+---------+-----------+----------+--------------+  PTV       Full                                                             +---------+---------------+---------+-----------+----------+--------------+  PERO                                                       Not visualized  +---------+---------------+---------+-----------+----------+--------------+   +---------+---------------+---------+-----------+----------+--------------+  LEFT      Compressibility Phasicity Spontaneity Properties Thrombus Aging  +---------+---------------+---------+-----------+----------+--------------+  CFV       Full            Yes       Yes                                    +---------+---------------+---------+-----------+----------+--------------+  SFJ       Full                                                              +---------+---------------+---------+-----------+----------+--------------+  FV Prox   Full                                                             +---------+---------------+---------+-----------+----------+--------------+  FV Mid    Full                                                             +---------+---------------+---------+-----------+----------+--------------+  FV Distal Full                                                             +---------+---------------+---------+-----------+----------+--------------+  PFV       Full                                                             +---------+---------------+---------+-----------+----------+--------------+  POP       Full            Yes       Yes                                    +---------+---------------+---------+-----------+----------+--------------+  PTV                                                        Not visualized  +---------+---------------+---------+-----------+----------+--------------+  PERO                                                       Not visualized  +---------+---------------+---------+-----------+----------+--------------+     Summary: Right: There is no evidence of deep vein thrombosis in the lower extremity. No cystic structure found in the popliteal fossa. Left: There is no evidence of deep vein thrombosis in the lower extremity. However, portions of this examination were limited- see technologist comments above. No cystic structure found in the popliteal fossa.  *See table(s) above for measurements and observations. Electronically signed by Monica Martinez MD on 08/20/2019 at 6:14:20 PM.    Final     Procedures Procedures (including critical care time)  Medications Ordered in ED Medications  sodium chloride flush (NS) 0.9 % injection 3 mL (3 mLs Intravenous Given 08/20/19 1830)     Initial Impression / Assessment and Plan / ED Course  I have reviewed the triage vital signs and the nursing  notes.  Pertinent labs & imaging results that were available during my care of the patient were reviewed by me and considered in my medical decision making (see chart for details).        85 yo M with a chief complaint of left-sided chest pain.  Patient also with some hypoxia at his house.  He is at his baseline oxygen saturation currently.  I discussed inpatient evaluation with VQ scan and bilateral Dopplers of the legs.  Family has been reluctant to come to the hospital.  As he is at his baseline oxygenation I will have him ambulate with a pulse ox and will obtain a VQ scan.  This is negative we will likely discharge the patient home and have him follow-up with his family doctor and treat this is muscular.  This is positive must consider that he had pulmonary embolism while on Eliquis and he may need to be put into the hospital for hematology evaluation or transition to a different anticoagulant. Chest x-ray with mild fluid overload.  Troponin is negative.  Mild leukocytosis.  Awaiting VQ scan.  DVT study is negative.  Signed out to [facility], please see her note for further details of care.  The patients results and plan were reviewed and discussed.   Any x-rays performed were independently reviewed by myself.   Differential diagnosis were considered with the presenting HPI.  Medications  sodium chloride flush (NS) 0.9 % injection 3 mL (3 mLs Intravenous Given 08/20/19 1830)    Vitals:   08/20/19 1415 08/20/19 1730  BP: (!) 125/51 117/63  Pulse: 90 80  Resp: 18 (!) 22  Temp: 98.4 F (36.9 C)   TempSrc: Oral   SpO2: 96% 93%    Final diagnoses:  Atypical chest pain    Admission/ observation were discussed with the admitting physician, patient and/or family and they are comfortable with the plan.    Final Clinical Impressions(s) / ED Diagnoses   Final diagnoses:  Atypical chest pain    ED Discharge Orders    None       Deno Etienne, DO 08/20/19 1918

## 2019-08-20 NOTE — ED Notes (Signed)
Pt discharged from ED; instructions provided; Pt encouraged to return to ED if symptoms worsen and to f/u with PCP; Pt verbalized understanding of all instructions 

## 2019-08-20 NOTE — Telephone Encounter (Signed)
Worrisome for a blood  Clot, can't dx this over the phone or office, should go to ER ASAP

## 2019-08-20 NOTE — ED Triage Notes (Signed)
Pt reports he has had pain on his left side since Friday. Pt report pain gets worse when taking a deep breath. Pt reports that his pulmonologist sent him here. Pt reports he was previously on home oxygen but has not needed it in the last 3 weeks until Sunday. Pt reports his oxygen sat would not get above 89% without the oxygen. Pt reports the pain is in his left chest and down his left side.

## 2019-08-20 NOTE — Telephone Encounter (Signed)
Called and spoke with Patient's Wife, Archie Patten.  Archie Patten stated they had been at the ED at [facility], since 2pm.  Archie Patten stated they had just been moved to a ED room.  Archie Patten stated ED provider explained they were going to preform test to check for any blood clots. Archie Patten was concerned [facility] providers did not know current treatments and meds Patient is currently on. Explained providers at [facility] have access to [facility] Pulmonary records. Nothing further at this time.

## 2019-08-20 NOTE — ED Provider Notes (Signed)
11:00 PM Patient awake, alert, speaking clearly, in no distress. He notes that he possibly has pulled muscle, while exercising a few days ago. He has no new dyspnea, no other pain, no fever. I reviewed all findings including normal troponin series, reassuring VQ scan, and absent hypoxia, tachypnea, patient is appropriate for close outpatient follow-up to which he is amenable, prefers. Patient discharged in stable condition.   Carmin Muskrat, MD 08/20/19 [phone]

## 2019-08-20 NOTE — ED Notes (Signed)
Pt wife would like a call when pt is roomed [phone]

## 2019-08-20 NOTE — ED Provider Notes (Signed)
Received patient at signout from Dr. Tyrone Nine.  Refer to provider note for full history and physical examination.  Briefly, patient is an 85 year old male with history of COPD presenting for evaluation of left-sided chest pain and shortness of breath.  Symptoms began suddenly today.  Patient recently had a prolonged stay in the hospital requiring supplemental oxygen but was able to be titrated off of this at home.  Per family he is typically anywhere between 35 to 92% SPO2 saturations at rest.  However today he is on oxygen saturations were lower so his family called his pulmonologist who felt that this and his chest pain could suggest a PE and sent him to the ED for rule out.  The patient is on Eliquis currently, reports that he has been compliant with this.  He is currently on dialysis which was initiated 2 months ago so he will require a VQ scan for evaluation of possible PE.  Pending delta troponin and VQ scan.  If VQ scan shows intermediate or high risk study then he will likely require admission.  If low risk study then he will likely be stable for discharge home.   MDM  Serial troponins are negative.  Pending VQ scan.  Care signed out to Dr. Vanita Panda to follow-up on results and impending disposition.      Renita Papa, PA-C 08/20/19 2123    Deno Etienne, DO 08/21/19 1457

## 2019-08-22 ENCOUNTER — Telehealth: Payer: Self-pay | Admitting: Internal Medicine

## 2019-08-22 NOTE — Telephone Encounter (Signed)
ATC, there was no answer and no voicemail picked up. Will try back.

## 2019-08-22 NOTE — Telephone Encounter (Signed)
Called and spoke to Beverley Fiedler, physical therapist with [facility].  She stated that patient was in the ED on 08/20/2019 with chest pain and was diagnosised with a muscle strain. She stated that patient was having trouble taking deep breaths due to pain but she did encourage him to continue to breathe deep and use devices he has at home. She reported when listening to lungs that she heard crackles in the left lower lobe and wanted to report this. Per the chest xray in the ED patient has small bilateral pleural effusions and mild bibasilar atelectasis.     Dr. Melvyn Novas if you would like to make any other adjustments or suggestions please advise. Thank you!

## 2019-08-22 NOTE — Telephone Encounter (Signed)
Nothing else to rec for now -  I reviewed the imaging studies and no surprises - we knew about the effusions - needs ov if recs are being followed and not helping but bring all meds with him

## 2019-08-23 NOTE — Telephone Encounter (Signed)
Contacted Joaquim Lai Benjamine Mola) at [facility] and reviewed Dr. Gustavus Bryant notes regarding the patient and effusions.  Joaquim Lai will be visiting the patient's home tomorrow 08/24/19 and will evaluate and schedule OV if she determines patient condition is declining or if there are new concerns which need addressing.  Nothing further needed.

## 2019-09-10 ENCOUNTER — Other Ambulatory Visit: Payer: Self-pay

## 2019-09-10 MED ORDER — AMIODARONE HCL 200 MG PO TABS: 200.0000 mg | ORAL_TABLET | Freq: Every day | ORAL | 0 refills | Status: DC

## 2019-09-14 ENCOUNTER — Other Ambulatory Visit: Payer: Self-pay

## 2019-09-14 DIAGNOSIS — N186 End stage renal disease: Secondary | ICD-10-CM

## 2019-09-15 NOTE — Progress Notes (Signed)
HISTORY AND PHYSICAL     CC:  dialysis access Requesting Provider:  Maury Dus, MD  HPI: This is a 85 y.o. male here for evaluation for hemodialysis access.  He underwent left RC AVF on 06/14/2019 by Dr. Donnetta Hutching.  He was seen on 7/28 by Dr. Donnetta Hutching and at that time, his fistula was maturing nicely.  He is sent here today for evaluation of his fistula as it is felt it is not maturing.  He states they have not tried to stick the fistula yet. His catheter was placed by IR and is working well.   He dialyzes T/T/S.  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    The pt is not on a statin for cholesterol management.  The pt is not diabetic.   The pt is not on meds for hypertension.   Tobacco hx:  former The pt is not on a daily aspirin. Other AC:  Eliquis  Past Medical History:  Diagnosis Date  . Alcohol abuse   . Colon cancer ([place])   . COPD (chronic obstructive pulmonary disease) ([place])   . Emphysema of lung ([place])   . ESRD (end stage renal disease) ([place])   . Former tobacco use   . Hypertension   . Peripheral edema   . Sepsis ([place]) 10/2016   Aspiration PNA/C diff colitis    Past Surgical History:  Procedure Laterality Date  . AV FISTULA PLACEMENT Left 06/14/2019   Procedure: Creation of Left arm arteriovenous fistula;  Surgeon: Rosetta Posner, MD;  Location: [place];  Service: Vascular;  Laterality: Left;  . CARDIOVERSION N/A 03/16/2019   Procedure: CARDIOVERSION;  Surgeon: Pixie Casino, MD;  Location[facility] ENDOSCOPY;  Service: Cardiovascular;  Laterality: N/A;  . CARDIOVERSION N/A 03/23/2019   Procedure: CARDIOVERSION;  Surgeon: Lelon Perla, MD;  Location: [facility] ENDOSCOPY;  Service: Cardiovascular;  Laterality: N/A;  . CATARACT EXTRACTION    . COLON RESECTION    . COLONOSCOPY    . IMPLANTATION BONE ANCHORED HEARING AID Left 2016  . IR FLUORO GUIDE CV LINE RIGHT  06/09/2019  . IR THORACENTESIS ASP PLEURAL SPACE W/IMG GUIDE   05/21/2019  . IR US GUIDE VASC ACCESS RIGHT  06/09/2019  . MINOR HEMORRHOIDECTOMY  1995  . TEE WITHOUT CARDIOVERSION N/A 03/16/2019   Procedure: TRANSESOPHAGEAL ECHOCARDIOGRAM (TEE);  Surgeon: Pixie Casino, MD;  Location: [facility] ENDOSCOPY;  Service: Cardiovascular;  Laterality: N/A;  . TONSILLECTOMY      No Known Allergies  Current Outpatient Medications  Medication Sig Dispense Refill  . albuterol (PROVENTIL) (2.5 MG/3ML) 0.083% nebulizer solution Take 3 mLs (2.5 mg total) by nebulization every 4 (four) hours as needed for wheezing or shortness of breath. 75 mL 12  . Albuterol Sulfate (PROAIR RESPICLICK) 397 (90 Base) MCG/ACT AEPB Inhale 1-2 puffs into the lungs every 4 (four) hours as needed.    Marland Kitchen allopurinol (ZYLOPRIM) 100 MG tablet Take 100 mg by mouth daily.    Marland Kitchen amiodarone (PACERONE) 200 MG tablet Take 1 tablet (200 mg total) by mouth daily. 90 tablet 0  . apixaban (ELIQUIS) 2.5 MG TABS tablet Take 1 tablet (2.5 mg total) by mouth 2 (two) times daily. 180 tablet 3  . Darbepoetin Alfa (ARANESP) 150 MCG/0.3ML SOSY injection Inject 0.3 mLs (150 mcg total) into the vein every Saturday with hemodialysis. 1.68 mL 0  . ferrous sulfate 325 (65 FE) MG tablet Take 325 mg by mouth daily.     Marland Kitchen  Fluticasone-Umeclidin-Vilant (TRELEGY ELLIPTA) 100-62.5-25 MCG/INH AEPB Inhale 1 puff into the lungs every morning. 1 each 11  . multivitamin (RENA-VIT) TABS tablet Take 1 tablet by mouth at bedtime. 90 tablet 0  . OXYGEN Inhale 4 L into the lungs continuous.    . sevelamer carbonate (RENVELA) 800 MG tablet Take 1 tablet (800 mg total) by mouth 3 (three) times daily with meals. 270 tablet 0  . zolpidem (AMBIEN) 10 MG tablet Take 1 tablet by mouth at bedtime as needed.     No current facility-administered medications for this visit.     Family History  Problem Relation Age of Onset  . Brain cancer Father   . Colon cancer Mother        with mets to liver  . Lung cancer Brother        was a smoker     Social History   Socioeconomic History  . Marital status: Married    Spouse name: Not on file  . Number of children: 3  . Years of education: Not on file  . Highest education level: Not on file  Occupational History  . Occupation: Retired- Associate Professor  Social Needs  . Financial resource strain: Not on file  . Food insecurity    Worry: Patient refused    Inability: Patient refused  . Transportation needs    Medical: Patient refused    Non-medical: Patient refused  Tobacco Use  . Smoking status: Former Smoker    Packs/day: 1.00    Years: 20.00    Pack years: 20.00    Types: Cigarettes    Quit date: 12/20/1981    Years since quitting: 37.7  . Smokeless tobacco: Never Used  Substance and Sexual Activity  . Alcohol use: Yes    Alcohol/week: 7.0 standard drinks    Types: 7 Standard drinks or equivalent per week    Comment: states he drinks 3-4 days a week, "a couple" on those occasions but does not further quaniity.  . Drug use: No  . Sexual activity: Never  Lifestyle  . Physical activity    Days per week: Patient refused    Minutes per session: Patient refused  . Stress: Not on file  Relationships  . Social Herbalist on phone: Patient refused    Gets together: Patient refused    Attends religious service: Patient refused    Active member of club or organization: Patient refused    Attends meetings of clubs or organizations: Patient refused    Relationship status: Patient refused  . Intimate partner violence    Fear of current or ex partner: Patient refused    Emotionally abused: Patient refused    Physically abused: Patient refused    Forced sexual activity: Patient refused  Other Topics Concern  . Not on file  Social History Narrative  . Not on file     ROS: [x]  Positive   [ ]  Negative   [ ]  All sytems reviewed and are negative  Cardiac: []  chest pain/pressure []  SOB []  DOE  Vascular: []  pain in legs while walking []  pain in  feet when lying flat []  hx of DVT []  swelling in legs  Pulmonary: [x]  home O2 at night    Neurologic: []  weakness in []  arms []  legs []  numbness in []  arms []  legs [] difficulty speaking or slurred speech []  temporary loss of vision in one eye []  dizziness  Hematologic: []  bleeding problems  GI []  GERD  GU: [  x] CKD/renal failure  [x]  HD---[]  M/W/F [x]  T/T/S  Psychiatric: []  hx of major depression  Integumentary: []  rashes []  ulcers  Constitutional: []  fever []  chills  PHYSICAL EXAMINATION:  Today's Vitals   09/17/19 1249  BP: 127/73  Pulse: 77  Resp: 14  Temp: 97.8 F (36.6 C)  TempSrc: Temporal  SpO2: 98%  Weight: 199 lb (90.3 kg)  Height: 5\' 10"  (1.778 m)   Body mass index is 28.55 kg/m.    General:  WDWN male in NAD Gait: Not observed HENT: WNL Pulmonary: normal non-labored breathing , without Rales, rhonchi,  wheezing Cardiac: regular, without  Murmurs without carotid bruits Skin: without rashes, without ulcers  Vascular Exam/Pulses:  Palpable radial pulses bilaterally Extremities: fistula is easily palpable with a thrill present Musculoskeletal: no muscle wasting or atrophy  Neurologic: A&O X 3; Moving all extremities equally;  Speech is fluent/normal  Non-Invasive Vascular Imaging:   Upper Extremity Vein Mapping on 09/17/2019: Diameter:  0.47-0.58cm Depth:  0.11-0.42cm -Patent left radial-cephalic fistula with no visualized stenosis. Decreased flow on proximally.  Upper extremity vein mapping 07/17/2019: Diameter:  0.57-0.68cm Depth:  0.11-0.42cm Arteriovenous fistula-Velocities less than 100cm/s noted in the antecubital fossa and prox forearm.  Patent left radiocephalic arteriovenous fistula with no evidence of hemodynamically significant stenosis. Retained valves noted in the proximal to mid segments of the forearm without stenoses noted. Competing branch measuring 0.40 cm is noted in the  proximal forearm.   ASSESSMENT/PLAN: 85 y.o.  male who is s/p left RC AVF on 06/14/2019 here for evaluation of his hemodialysis access  --pt sent here for evaluation of his fistula.  On duplex today, the fistula is not maturing.  Will schedule him for fistulogram on a M/W/F.  Discussed with pt that hopeful there will be something to intervene on to help fistula mature.  If not, may need to abandon and go more proximal for fistula. -pt is hoping for PD eventually -pt is on Eliquis   Leontine Locket, PA-C [facility] [phone]  Clinic MD:   Trula Slade

## 2019-09-17 ENCOUNTER — Ambulatory Visit (HOSPITAL_COMMUNITY)
Admission: RE | Admit: 2019-09-17 | Discharge: 2019-09-17 | Disposition: A | Discharge status: Home / Self Care | Payer: Medicare Other | Source: Ambulatory Visit | Attending: Family | Admitting: Family

## 2019-09-17 ENCOUNTER — Ambulatory Visit: Payer: Medicare Other | Admitting: Physician Assistant

## 2019-09-17 ENCOUNTER — Encounter: Payer: Self-pay | Admitting: *Deleted

## 2019-09-17 ENCOUNTER — Other Ambulatory Visit: Payer: Self-pay

## 2019-09-17 VITALS — BP 127/73 | HR 77 | Temp 97.8°F | Resp 14 | Ht 70.0 in | Wt 199.0 lb

## 2019-09-17 DIAGNOSIS — N186 End stage renal disease: Secondary | ICD-10-CM

## 2019-09-18 ENCOUNTER — Telehealth: Payer: Self-pay | Admitting: *Deleted

## 2019-09-18 ENCOUNTER — Other Ambulatory Visit: Payer: Self-pay | Admitting: *Deleted

## 2019-09-18 NOTE — Telephone Encounter (Signed)
Pt takes Eliquis for afib with CHADS2VASc score of 5 (age x2, sex, CHF, HTN). Pt is on dialysis, on appropriately reduced dose of Eliquis due to age and renal function. Ok to hold Eliquis 2-3 days prior to fistulogram.

## 2019-09-18 NOTE — Telephone Encounter (Signed)
Request for Surgical Clearance  1. What type of surgery is being performed?  Fistulogram of left A/V fistula    2. When is this surgery scheduled? 10/19/2019  3. What type of clearance is required (medical clearance vs. Pharmacy clearance to hold med vs. Both)? PHARMACY ONLY     4. Are there any medications that need to be held prior to surgery and how long?  ELIQUIS HOLD X 2 DAYS PRE PROCEDURE.    5. Practice name and name of physician performing surgery?   [facility] DR. DICKSON    6.  What is your office phone number? [phone]    7. What is your office fax number? (Be sure to include anyone who it needs to go Attn to) [phone] ATTN: [id number]    8. Anesthesia type (None, local, MAC, general)? IV SEDATION    REMINDER TO USER: Remember to please route this message to P CV DIV PREOP in a phone note.

## 2019-09-18 NOTE — Telephone Encounter (Signed)
Please comment on eliquis. 

## 2019-09-18 NOTE — Telephone Encounter (Signed)
   Primary Cardiologist: Quay Burow, MD  Chart reviewed as part of pre-operative protocol coverage. Request received to hold eliquis for fistulogram.   Per our clinical pharmacist: Pt takes Eliquis for afib with CHADS2VASc score of 5 (age x2, sex, CHF, HTN). Pt is on dialysis, on appropriately reduced dose of Eliquis due to age and renal function. Ok to hold Eliquis 2-3 days prior to fistulogram.  I will route this recommendation to the requesting party via [facility] fax function and remove from pre-op pool.  Please call with questions.  Ledora Bottcher, PA 09/18/2019, 3:43 PM

## 2019-10-12 ENCOUNTER — Other Ambulatory Visit: Payer: Self-pay

## 2019-10-12 ENCOUNTER — Ambulatory Visit: Payer: Medicare Other | Admitting: Cardiovascular Disease

## 2019-10-12 ENCOUNTER — Encounter: Payer: Self-pay | Admitting: Cardiovascular Disease

## 2019-10-12 DIAGNOSIS — I4891 Unspecified atrial fibrillation: Secondary | ICD-10-CM

## 2019-10-12 DIAGNOSIS — I10 Essential (primary) hypertension: Secondary | ICD-10-CM

## 2019-10-12 DIAGNOSIS — I5021 Acute systolic (congestive) heart failure: Secondary | ICD-10-CM

## 2019-10-12 NOTE — Assessment & Plan Note (Signed)
History of combined systolic and diastolic heart failure with his most recent echo performed 05/21/2019 revealing EF of 45 to 50%, somewhat reduced compared to his prior echo 3 months prior.

## 2019-10-12 NOTE — Assessment & Plan Note (Signed)
History of new onset atrial fibrillation during an episode of sepsis in January.  He did have a TEE guided cardioversion back in March on amiodarone.  He was seen by Kerin Ransom in July at which time it was mentioned that he was in sinus rhythm.  He remains on amiodarone 200 mg a day and low-dose Eliquis.

## 2019-10-12 NOTE — Progress Notes (Signed)
10/12/2019 DECKER COGDELL   [birth year]  [id number]  Primary Physician Maury Dus, MD Primary Cardiologist: Lorretta Harp MD FACP, [place], [place], [place]  HPI:  David Barton is a 85 y.o.  mild to moderately overweight married Caucasian male father of 2, grandfather one grandchild who I initially saw during a hospitalization in November during episode of sepsis.I last saw him in the office  01/17/2019. He has a history of remote tobacco abuse. He underwent [employer] which he retired from an age 12. He has lost 50 pounds intentionally and feels much better. There is a history of colon cancer. When I saw him in November he was staring episode of sepsis and mildly elevated troponins. Also had some PSVT. His EF at that time was 30-35% which is ultimately improved this 55-60% by 2-D echo done recently. He is completely asymptomatic. Since I saw him March 2018 he has gained 40 pounds. He does admit to dietary indiscretion. He has had increasing dyspnea on exertion over the last several weeks which improved with the ministration of low-dose oral diuretics. Edema improved as well. He says that he watches his salt. He denies chest pain. Since I saw him in the office back in January of this year he has had a fairly eventful year from a medical point of view.  He was placed on dialysis in June and since that time he feels clinically improved.  He has had acute on chronic systolic and diastolic heart failure, thoracentesis and TEE guided cardioversion in March of this year.  He was seen by Kerin Ransom in July which time he was thought to be in sinus rhythm on amiodarone and Eliquis oral anticoagulation.  He currently exercises daily walking 12 minutes a day.  He denies chest pain or shortness of breath.   Current Meds  Medication Sig  . allopurinol (ZYLOPRIM) 100 MG tablet Take 100 mg by mouth daily.  Marland Kitchen amiodarone (PACERONE) 200 MG tablet Take 1 tablet (200 mg total) by mouth daily.  Marland Kitchen apixaban  (ELIQUIS) 2.5 MG TABS tablet Take 1 tablet (2.5 mg total) by mouth 2 (two) times daily.  . Darbepoetin Alfa (ARANESP) 150 MCG/0.3ML SOSY injection Inject 0.3 mLs (150 mcg total) into the vein every Saturday with hemodialysis.  . ferrous sulfate 325 (65 FE) MG tablet Take 325 mg by mouth daily.   . Fluticasone-Umeclidin-Vilant (TRELEGY ELLIPTA) 100-62.5-25 MCG/INH AEPB Inhale 1 puff into the lungs every morning.  . multivitamin (RENA-VIT) TABS tablet Take 1 tablet by mouth at bedtime.  . OXYGEN Inhale 4 L into the lungs continuous.  . sevelamer carbonate (RENVELA) 800 MG tablet Take 1 tablet (800 mg total) by mouth 3 (three) times daily with meals.  Marland Kitchen zolpidem (AMBIEN) 10 MG tablet Take 1 tablet by mouth at bedtime as needed.  . [DISCONTINUED] albuterol (PROVENTIL) (2.5 MG/3ML) 0.083% nebulizer solution Take 3 mLs (2.5 mg total) by nebulization every 4 (four) hours as needed for wheezing or shortness of breath.  . [DISCONTINUED] Albuterol Sulfate (PROAIR RESPICLICK) 762 (90 Base) MCG/ACT AEPB Inhale 1-2 puffs into the lungs every 4 (four) hours as needed.     No Known Allergies  Social History   Socioeconomic History  . Marital status: Married    Spouse name: Not on file  . Number of children: 3  . Years of education: Not on file  . Highest education level: Not on file  Occupational History  . Occupation: Retired- Associate Professor  Social Needs  .  Financial resource strain: Not on file  . Food insecurity    Worry: Patient refused    Inability: Patient refused  . Transportation needs    Medical: Patient refused    Non-medical: Patient refused  Tobacco Use  . Smoking status: Former Smoker    Packs/day: 1.00    Years: 20.00    Pack years: 20.00    Types: Cigarettes    Quit date: 12/20/1981    Years since quitting: 37.8  . Smokeless tobacco: Never Used  Substance and Sexual Activity  . Alcohol use: Yes    Alcohol/week: 7.0 standard drinks    Types: 7 Standard drinks or  equivalent per week    Comment: states he drinks 3-4 days a week, "a couple" on those occasions but does not further quaniity.  . Drug use: No  . Sexual activity: Never  Lifestyle  . Physical activity    Days per week: Patient refused    Minutes per session: Patient refused  . Stress: Not on file  Relationships  . Social Herbalist on phone: Patient refused    Gets together: Patient refused    Attends religious service: Patient refused    Active member of club or organization: Patient refused    Attends meetings of clubs or organizations: Patient refused    Relationship status: Patient refused  . Intimate partner violence    Fear of current or ex partner: Patient refused    Emotionally abused: Patient refused    Physically abused: Patient refused    Forced sexual activity: Patient refused  Other Topics Concern  . Not on file  Social History Narrative  . Not on file     Review of Systems: General: negative for chills, fever, night sweats or weight changes.  Cardiovascular: negative for chest pain, dyspnea on exertion, edema, orthopnea, palpitations, paroxysmal nocturnal dyspnea or shortness of breath Dermatological: negative for rash Respiratory: negative for cough or wheezing Urologic: negative for hematuria Abdominal: negative for nausea, vomiting, diarrhea, bright red blood per rectum, melena, or hematemesis Neurologic: negative for visual changes, syncope, or dizziness All other systems reviewed and are otherwise negative except as noted above.    Blood pressure 127/65, pulse 78, temperature (!) 96.4 F (35.8 C), height 5\' 10"  (1.778 m), weight 191 lb 12.8 oz (87 kg), SpO2 95 %.  General appearance: alert and no distress Neck: no adenopathy, no carotid bruit, no JVD, supple, symmetrical, trachea midline and thyroid not enlarged, symmetric, no tenderness/mass/nodules Lungs: clear to auscultation bilaterally Heart: regular rate and rhythm, S1, S2 normal, no  murmur, click, rub or gallop Extremities: extremities normal, atraumatic, no cyanosis or edema Pulses: 2+ and symmetric Skin: Skin color, texture, turgor normal. No rashes or lesions Neurologic: Alert and oriented X 3, normal strength and tone. Normal symmetric reflexes. Normal coordination and gait  EKG atrial fibrillation with ventricular spots of 98, right bundle branch block.  I personally reviewed this EKG.  ASSESSMENT AND PLAN:   Essential hypertension History of essential hypertension with blood pressure measured today 127/65.  He currently is not on any antihypertensive medications.  Acute diastolic CHF (congestive heart failure) ([name]) History of combined systolic and diastolic heart failure with his most recent echo performed 05/21/2019 revealing EF of 45 to 50%, somewhat reduced compared to his prior echo 3 months prior.  Atrial fibrillation ([place]) History of new onset atrial fibrillation during an episode of sepsis in January.  He did have a TEE guided cardioversion back in March  on amiodarone.  He was seen by Kerin Ransom in July at which time it was mentioned that he was in sinus rhythm.  He remains on amiodarone 200 mg a day and low-dose Eliquis.      Lorretta Harp MD FACP,FACC,FAHA, [facility] 10/12/2019 10:41 AM

## 2019-10-12 NOTE — Assessment & Plan Note (Addendum)
History of essential hypertension with blood pressure measured today 127/65.  He currently is not on any antihypertensive medications.

## 2019-10-12 NOTE — Patient Instructions (Addendum)
Medication Instructions:  Your physician recommends that you continue on your current medications as directed. Please refer to the Current Medication list given to you today.  If you need a refill on your cardiac medications before your next appointment, please call your pharmacy.   Lab work: NONE If you have labs (blood work) drawn today and your tests are completely normal, you will receive your results only by: Marland Kitchen MyChart Message (if you have MyChart) OR . A paper copy in the mail If you have any lab test that is abnormal or we need to change your treatment, we will call you to review the results.  Testing/Procedures: NONE  Follow-Up: At [facility], you and your health needs are our priority.  As part of our continuing mission to provide you with exceptional heart care, we have created designated Provider Care Teams.  These Care Teams include your primary Cardiologist (physician) and Advanced Practice Providers (APPs -  Physician Assistants and Nurse Practitioners) who all work together to provide you with the care you need, when you need it. You will need a follow up appointment in 6 months with Kerin Ransom, PA-C AND IN 12 MONTHS WITH Dr. Quay Burow.  Please call our office 2 months in advance to schedule each appointment.

## 2019-10-16 ENCOUNTER — Inpatient Hospital Stay (HOSPITAL_COMMUNITY)
Admission: RE | Admit: 2019-10-16 | Discharge: 2019-10-16 | Disposition: A | Discharge status: Home / Self Care | Payer: Medicare Other | Source: Ambulatory Visit

## 2019-10-17 ENCOUNTER — Other Ambulatory Visit (HOSPITAL_COMMUNITY)
Admission: RE | Admit: 2019-10-17 | Discharge: 2019-10-17 | Disposition: A | Discharge status: Home / Self Care | Payer: Medicare Other | Source: Ambulatory Visit | Attending: Vascular Surgery | Admitting: Vascular Surgery

## 2019-10-17 DIAGNOSIS — Z20828 Contact with and (suspected) exposure to other viral communicable diseases: Secondary | ICD-10-CM | POA: Insufficient documentation

## 2019-10-17 DIAGNOSIS — Z01812 Encounter for preprocedural laboratory examination: Secondary | ICD-10-CM | POA: Insufficient documentation

## 2019-10-17 LAB — SARS CORONAVIRUS 2 (TAT 6-24 HRS): SARS Coronavirus 2: NEGATIVE

## 2019-10-17 NOTE — Progress Notes (Signed)
Pt no showed covid testing appt 10/16/19. Attempted to call patient to reschedule. VM left to return call

## 2019-10-19 ENCOUNTER — Other Ambulatory Visit: Payer: Self-pay

## 2019-10-19 ENCOUNTER — Encounter (HOSPITAL_COMMUNITY): Payer: Self-pay | Admitting: Vascular Surgery

## 2019-10-19 ENCOUNTER — Ambulatory Visit (HOSPITAL_COMMUNITY)
Admission: RE | Admit: 2019-10-19 | Discharge: 2019-10-19 | Disposition: A | Discharge status: Home / Self Care | Payer: Medicare Other | Attending: Vascular Surgery | Admitting: Vascular Surgery

## 2019-10-19 ENCOUNTER — Encounter (HOSPITAL_COMMUNITY)
Admission: RE | Disposition: A | Discharge status: Home / Self Care | Payer: Self-pay | Source: Home / Self Care | Attending: Vascular Surgery

## 2019-10-19 DIAGNOSIS — T82898A Other specified complication of vascular prosthetic devices, implants and grafts, initial encounter: Secondary | ICD-10-CM

## 2019-10-19 DIAGNOSIS — I120 Hypertensive chronic kidney disease with stage 5 chronic kidney disease or end stage renal disease: Secondary | ICD-10-CM | POA: Insufficient documentation

## 2019-10-19 DIAGNOSIS — Z85038 Personal history of other malignant neoplasm of large intestine: Secondary | ICD-10-CM | POA: Diagnosis not present

## 2019-10-19 DIAGNOSIS — Z7901 Long term (current) use of anticoagulants: Secondary | ICD-10-CM | POA: Diagnosis not present

## 2019-10-19 DIAGNOSIS — Z992 Dependence on renal dialysis: Secondary | ICD-10-CM

## 2019-10-19 DIAGNOSIS — Z79899 Other long term (current) drug therapy: Secondary | ICD-10-CM | POA: Insufficient documentation

## 2019-10-19 DIAGNOSIS — N186 End stage renal disease: Secondary | ICD-10-CM | POA: Insufficient documentation

## 2019-10-19 DIAGNOSIS — Z87891 Personal history of nicotine dependence: Secondary | ICD-10-CM | POA: Insufficient documentation

## 2019-10-19 DIAGNOSIS — J449 Chronic obstructive pulmonary disease, unspecified: Secondary | ICD-10-CM | POA: Diagnosis not present

## 2019-10-19 HISTORY — PX: A/V FISTULAGRAM: CATH118298

## 2019-10-19 LAB — POCT I-STAT, CHEM 8
BUN: 32 mg/dL — ABNORMAL HIGH (ref 8–23)
Calcium, Ion: 1.15 mmol/L (ref 1.15–1.40)
Chloride: 93 mmol/L — ABNORMAL LOW (ref 98–111)
Creatinine, Ser: 3.3 mg/dL — ABNORMAL HIGH (ref 0.61–1.24)
Glucose, Bld: 97 mg/dL (ref 70–99)
HCT: 40 % (ref 39.0–52.0)
Hemoglobin: 13.6 g/dL (ref 13.0–17.0)
Potassium: 3.7 mmol/L (ref 3.5–5.1)
Sodium: 135 mmol/L (ref 135–145)
TCO2: 33 mmol/L — ABNORMAL HIGH (ref 22–32)

## 2019-10-19 SURGERY — A/V FISTULAGRAM
Anesthesia: LOCAL

## 2019-10-19 MED ORDER — LIDOCAINE HCL (PF) 1 % IJ SOLN
INTRAMUSCULAR | Status: AC
  Filled 2019-10-19: qty 30

## 2019-10-19 MED ORDER — MIDAZOLAM HCL 2 MG/2ML IJ SOLN
INTRAMUSCULAR | Status: DC | PRN
  Administered 2019-10-19: 1 mg via INTRAVENOUS

## 2019-10-19 MED ORDER — FENTANYL CITRATE (PF) 100 MCG/2ML IJ SOLN
INTRAMUSCULAR | Status: AC
  Filled 2019-10-19: qty 2

## 2019-10-19 MED ORDER — LIDOCAINE HCL (PF) 1 % IJ SOLN
INTRAMUSCULAR | Status: DC | PRN
  Administered 2019-10-19: 2 mL via INTRADERMAL

## 2019-10-19 MED ORDER — HEPARIN (PORCINE) IN NACL 1000-0.9 UT/500ML-% IV SOLN
INTRAVENOUS | Status: DC | PRN
  Administered 2019-10-19: 500 mL

## 2019-10-19 MED ORDER — FENTANYL CITRATE (PF) 100 MCG/2ML IJ SOLN
INTRAMUSCULAR | Status: DC | PRN
  Administered 2019-10-19: 50 ug via INTRAVENOUS

## 2019-10-19 MED ORDER — IODIXANOL 320 MG/ML IV SOLN
INTRAVENOUS | Status: DC | PRN
  Administered 2019-10-19: 11:00:00 35 mL via INTRA_ARTERIAL

## 2019-10-19 MED ORDER — MIDAZOLAM HCL 2 MG/2ML IJ SOLN
INTRAMUSCULAR | Status: AC
  Filled 2019-10-19: qty 2

## 2019-10-19 MED ORDER — HEPARIN (PORCINE) IN NACL 1000-0.9 UT/500ML-% IV SOLN
INTRAVENOUS | Status: AC
  Filled 2019-10-19: qty 500

## 2019-10-19 MED ORDER — SODIUM CHLORIDE 0.9% FLUSH: 3.0000 mL | INTRAVENOUS | Status: DC | PRN

## 2019-10-19 SURGICAL SUPPLY — 9 items

## 2019-10-19 NOTE — Discharge Instructions (Signed)

## 2019-10-19 NOTE — H&P (Signed)
CC: dialysis access  Requesting Provider: Maury Dus, MD  HPI: This is a 85 y.o. male here for evaluation for hemodialysis access. He underwent left RC AVF on 06/14/2019 by Dr. Donnetta Hutching. He was seen on 7/28 by Dr. Donnetta Hutching and at that time, his fistula was maturing nicely. He is sent here today for evaluation of his fistula as it is felt it is not maturing. He states they have not tried to stick the fistula yet. His catheter was placed by IR and is working well. He dialyzes T/T/S.  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  The pt is not on a statin for cholesterol management.  The pt is not diabetic.  The pt is not on meds for hypertension.  Tobacco hx: former  The pt is not on a daily aspirin.  Other AC: Eliquis      Past Medical History:  Diagnosis Date  . Alcohol abuse   . Colon cancer ([place])   . COPD (chronic obstructive pulmonary disease) ([place])   . Emphysema of lung ([place])   . ESRD (end stage renal disease) ([place])   . Former tobacco use   . Hypertension   . Peripheral edema   . Sepsis ([place]) 10/2016   Aspiration PNA/C diff colitis        Past Surgical History:  Procedure Laterality Date  . AV FISTULA PLACEMENT Left 06/14/2019   Procedure: Creation of Left arm arteriovenous fistula; Surgeon: Rosetta Posner, MD; Location: [place]; Service: Vascular; Laterality: Left;  . CARDIOVERSION N/A 03/16/2019   Procedure: CARDIOVERSION; Surgeon: Pixie Casino, MD; Location: [facility] ENDOSCOPY; Service: Cardiovascular; Laterality: N/A;  . CARDIOVERSION N/A 03/23/2019   Procedure: CARDIOVERSION; Surgeon: Lelon Perla, MD; Location: [facility] ENDOSCOPY; Service: Cardiovascular; Laterality: N/A;  . CATARACT EXTRACTION    . COLON RESECTION    . COLONOSCOPY    . IMPLANTATION BONE ANCHORED HEARING AID Left 2016  . IR FLUORO GUIDE CV LINE RIGHT  06/09/2019  . IR THORACENTESIS ASP PLEURAL SPACE W/IMG GUIDE  05/21/2019  . IR US GUIDE VASC ACCESS RIGHT   06/09/2019  . MINOR HEMORRHOIDECTOMY  1995  . TEE WITHOUT CARDIOVERSION N/A 03/16/2019   Procedure: TRANSESOPHAGEAL ECHOCARDIOGRAM (TEE); Surgeon: Pixie Casino, MD; Location: [facility] ENDOSCOPY; Service: Cardiovascular; Laterality: N/A;  . TONSILLECTOMY    No Known Allergies        Current Outpatient Medications  Medication Sig Dispense Refill  . albuterol (PROVENTIL) (2.5 MG/3ML) 0.083% nebulizer solution Take 3 mLs (2.5 mg total) by nebulization every 4 (four) hours as needed for wheezing or shortness of breath. 75 mL 12  . Albuterol Sulfate (PROAIR RESPICLICK) 034 (90 Base) MCG/ACT AEPB Inhale 1-2 puffs into the lungs every 4 (four) hours as needed.    Marland Kitchen allopurinol (ZYLOPRIM) 100 MG tablet Take 100 mg by mouth daily.    Marland Kitchen amiodarone (PACERONE) 200 MG tablet Take 1 tablet (200 mg total) by mouth daily. 90 tablet 0  . apixaban (ELIQUIS) 2.5 MG TABS tablet Take 1 tablet (2.5 mg total) by mouth 2 (two) times daily. 180 tablet 3  . Darbepoetin Alfa (ARANESP) 150 MCG/0.3ML SOSY injection Inject 0.3 mLs (150 mcg total) into the vein every Saturday with hemodialysis. 1.68 mL 0  . ferrous sulfate 325 (65 FE) MG tablet Take 325 mg by mouth daily.     . Fluticasone-Umeclidin-Vilant (TRELEGY ELLIPTA) 100-62.5-25 MCG/INH AEPB Inhale 1 puff into the lungs every morning. 1 each 11  .  multivitamin (RENA-VIT) TABS tablet Take 1 tablet by mouth at bedtime. 90 tablet 0  . OXYGEN Inhale 4 L into the lungs continuous.    . sevelamer carbonate (RENVELA) 800 MG tablet Take 1 tablet (800 mg total) by mouth 3 (three) times daily with meals. 270 tablet 0  . zolpidem (AMBIEN) 10 MG tablet Take 1 tablet by mouth at bedtime as needed.     No current facility-administered medications for this visit.         Family History  Problem Relation Age of Onset  . Brain cancer Father   . Colon cancer Mother    with mets to liver  . Lung cancer Brother    was a smoker   Social History        Socioeconomic History  .  Marital status: Married    Spouse name: Not on file  . Number of children: 3  . Years of education: Not on file  . Highest education level: Not on file  Occupational History  . Occupation: Retired- Associate Professor  Social Needs  . Financial resource strain: Not on file  . Food insecurity    Worry: Patient refused    Inability: Patient refused  . Transportation needs    Medical: Patient refused    Non-medical: Patient refused  Tobacco Use  . Smoking status: Former Smoker    Packs/day: 1.00    Years: 20.00    Pack years: 20.00    Types: Cigarettes    Quit date: 12/20/1981    Years since quitting: 37.7  . Smokeless tobacco: Never Used  Substance and Sexual Activity  . Alcohol use: Yes    Alcohol/week: 7.0 standard drinks    Types: 7 Standard drinks or equivalent per week    Comment: states he drinks 3-4 days a week, "a couple" on those occasions but does not further quaniity.  . Drug use: No  . Sexual activity: Never  Lifestyle  . Physical activity    Days per week: Patient refused    Minutes per session: Patient refused  . Stress: Not on file  Relationships  . Social Herbalist on phone: Patient refused    Gets together: Patient refused    Attends religious service: Patient refused    Active member of club or organization: Patient refused    Attends meetings of clubs or organizations: Patient refused    Relationship status: Patient refused  . Intimate partner violence    Fear of current or ex partner: Patient refused    Emotionally abused: Patient refused    Physically abused: Patient refused    Forced sexual activity: Patient refused  Other Topics Concern  . Not on file  Social History Narrative  . Not on file  ROS: [x]  Positive [ ]  Negative [ ]  All sytems reviewed and are negative  Cardiac:  []  chest pain/pressure  []  SOB  []  DOE  Vascular:  []  pain in legs while walking  []  pain in feet when lying flat  []  hx of DVT  []  swelling in  legs  Pulmonary:  [x]  home O2 at night  Neurologic:  []  weakness in []  arms []  legs  []  numbness in []  arms []  legs  [] difficulty speaking or slurred speech  []  temporary loss of vision in one eye  []  dizziness  Hematologic:  []  bleeding problems  GI  []  GERD  GU:  [x]  CKD/renal failure [x]  HD---[]  M/W/F [x]  T/T/S  Psychiatric:  []   hx of major depression  Integumentary:  []  rashes []  ulcers  Constitutional:  []  fever []  chills  PHYSICAL EXAMINATION:     Today's Vitals   09/17/19 1249  BP: 127/73  Pulse: 77  Resp: 14  Temp: 97.8 F (36.6 C)  TempSrc: Temporal  SpO2: 98%  Weight: 199 lb (90.3 kg)  Height: 5\' 10"  (1.778 m)  Body mass index is 28.55 kg/m.  General: WDWN male in NAD  Gait: Not observed  HENT: WNL  Pulmonary: normal non-labored breathing , without Rales, rhonchi, wheezing  Cardiac: regular, without Murmurs without carotid bruits  Skin: without rashes, without ulcers  Vascular Exam/Pulses:  Palpable radial pulses bilaterally  Extremities: fistula is easily palpable with a thrill present  Musculoskeletal: no muscle wasting or atrophy  Neurologic: A&O X 3; Moving all extremities equally; Speech is fluent/normal  Non-Invasive Vascular Imaging:  Upper Extremity Vein Mapping on 09/17/2019:  Diameter: 0.47-0.58cm  Depth: 0.11-0.42cm  -Patent left radial-cephalic fistula with no visualized stenosis. Decreased flow on proximally.  Upper extremity vein mapping 07/17/2019:  Diameter: 0.57-0.68cm  Depth: 0.11-0.42cm  Arteriovenous fistula-Velocities less than 100cm/s noted in the antecubital  fossa and prox forearm.  Patent left radiocephalic arteriovenous fistula with no evidence of hemodynamically significant stenosis. Retained valves noted in the proximal to mid segments of the forearm without stenoses noted. Competing branch measuring 0.40 cm is noted in the  proximal forearm.  ASSESSMENT/PLAN: 85 y.o. male who is s/p left RC AVF on 06/14/2019 here for  evaluation of his hemodialysis access  --pt sent here for evaluation of his fistula. On duplex today, the fistula is not maturing. Will schedule him for fistulogram on a M/W/F. Discussed with pt that hopeful there will be something to intervene on to help fistula mature. If not, may need to abandon and go more proximal for fistula.  -pt is hoping for PD eventually  -pt is on Eliquis [name], MD Office: [phone]

## 2019-10-19 NOTE — Op Note (Signed)
   PATIENT: David Barton      MRN: [id number] DOB: 1933/05/03    DATE OF PROCEDURE: 10/19/2019  INDICATIONS:    [name] is a 85 y.o. male who had a left radiocephalic fistula placed in June of this year.  This apparently was not maturing adequately.  He was seen a month ago by the physicians assistant and set up for a fistulogram.  PROCEDURE:    1.  Ultrasound-guided access to the left radiocephalic fistula 2.  Fistulogram left radiocephalic fistula  SURGEON: Judeth Cornfield. Scot Dock, MD, FACS  ANESTHESIA: Local with sedation  EBL: Minimal  TECHNIQUE: The patient was brought to the peripheral vascular lab and received 1 mg of Versed and 50 mcg of fentanyl.  Vital signs were monitored throughout the procedure.  The left arm was prepped and draped in usual sterile fashion.  After the skin was anesthetized with 1% lidocaine, under ultrasound guidance, the proximal fistula was cannulated with a micropuncture needle and a micropuncture sheath introduced over a wire.  A fistulogram was obtained to evaluate the fistula from the point of cannulation to include the central veins.  The blood pressure was inflated on the upper arm and a retrograde shot was performed in order to evaluate the proximal fistula and arterial anastomosis.  At the completion of 4-0 Monocryl suture was placed around the cannulation site in the micropuncture sheath was removed.  There was good hemostasis.  No immediate complications were noted.  FINDINGS:   1.  The left radiocephalic fistula is widely patent with no areas of stenosis identified. 2.  There is no central venous stenosis. 3.  The arterial anastomosis is widely patent.  CLINICAL NOTE: I think the fistula can be accessed at this point.  No problems are identified both by duplex and by fistulogram.  [name], MD, FACS [facility] DICTATION:   10/19/2019

## 2019-11-05 ENCOUNTER — Encounter: Payer: Self-pay | Admitting: Internal Medicine

## 2019-11-05 ENCOUNTER — Ambulatory Visit (INDEPENDENT_AMBULATORY_CARE_PROVIDER_SITE_OTHER): Payer: Medicare Other

## 2019-11-05 ENCOUNTER — Ambulatory Visit: Payer: Medicare Other | Admitting: Internal Medicine

## 2019-11-05 ENCOUNTER — Other Ambulatory Visit: Payer: Self-pay

## 2019-11-05 DIAGNOSIS — J9612 Chronic respiratory failure with hypercapnia: Secondary | ICD-10-CM | POA: Diagnosis not present

## 2019-11-05 DIAGNOSIS — J9611 Chronic respiratory failure with hypoxia: Secondary | ICD-10-CM | POA: Diagnosis not present

## 2019-11-05 DIAGNOSIS — J449 Chronic obstructive pulmonary disease, unspecified: Secondary | ICD-10-CM | POA: Diagnosis not present

## 2019-11-05 DIAGNOSIS — J90 Pleural effusion, not elsewhere classified: Secondary | ICD-10-CM

## 2019-11-05 NOTE — Patient Instructions (Signed)
Please remember to go to the  x-ray department  for your tests - we will call you with the results when they are available    Have your wife check your oxygen level sleeping - we need to keep it around 90% or higher  Follow up will be as needed

## 2019-11-05 NOTE — Progress Notes (Signed)
Spoke with pt and notified of results per Dr. Wert. Pt verbalized understanding and denied any questions. 

## 2019-11-05 NOTE — Progress Notes (Signed)
David Barton, male    DOB: 1933/12/20,    MRN: [id number]   Brief patient profile:  47 yowm  Quit smoking 1983  At wt 180 with baseline able to walk the dog 10 minutes slow pace but  avoided steps maint on spiriva with GOLD II COPD criteria 07/16/13     History of Present Illness  04/24/2019  Pulmonary/  office eval/Rowan Pollman / 02 4lpm 24/7 / persistent afib and started amiodarone since last ov Chief Complaint  Patient presents with  . Pulmonary Consult    Referred by Kerin Ransom, PA. Pt c/o SOB since March 2020. Pt c/o DOE with walking from room to room.  He has had some cough with clear to bloody sputum.     Dyspnea:  Gradually losing ground now Room to room gives out due to sob even on 4lpm  Cough: more bloody/ no pain with cough Sleep: on flat bed, 3 pillows on back SABA QMV:HQIONG helps the most  rec Stop spiriva and take the  duoneb up to 4 x in 24 hours as you feel you need it if it helps your shortness of breath For cough/congestion >  mucinex or mucinex dm up to 1200 mg every 12 hours and use flutter valve  as much as you can  Humidify 02 as much of the time as your can    06/27/2019  f/u ov/Joshlynn Alfonzo re:  S/p thoracentesis/ HD initiation on anoro and pulmocort Chief Complaint  Patient presents with  . Follow-up    Breathing is "ok" no new co's.   Dyspnea:  Room to room at home / on 3lpm  Cough: none Sleeping: flat in bed / two pillows  02: 3lpm 24/7  rec Plan A = Automatic = Trelegy one click - take two good drags  Plan B = Backup Only use your albuterol (proair resplick) as a rescue medication Plan C = Crisis - only use your albuterol nebulizer if you first try Plan B and it fails to help > ok to use the nebulizer up to every 4 hours but if start needing it regularly call for immediate appointment Stay on 3lpm at bedtime but ok to adjust during the day to keep your saturations over 90%     08/03/2019  f/u ov/Roman Dubuc re: f/u effusions / copd doing better on trelegy  Chief  Complaint  Patient presents with  . Follow-up    CXR repeated. Breathing has improved. He has not been using his albuterol inhaler or neb.   Dyspnea:  Says can walk "1000 yards" ( I believe he means feet)  off 02 but does not check level of  Treadmill x 5 min tired x 1.2 mph no grade at all, also does not check sats of 02  Cough: none  Sleeping: flat in bed, 2 pillows SABA use: none  02: 3lpm bedtime , not at all during the day rec The 02 saturation should at peak exercise once or twice a week with goal of keeping over 90%  Continue 3lpm at bedtime     11/05/2019  f/u ov/Passion Lavin re: copd GOLD II on trelegy/  f/u bilateral effusions/ stopped 02 end of oct 2020 Chief Complaint  Patient presents with  . Follow-up    Breathing is doing well. He c/o occ fatigue.    Dyspnea:  teadmill x 37min RA x  1.2 mph 2 notches  Cough: no cough just some throat clearing, mild hoarseness on trelgy Sleeping: flat p sleeping pill x  one pillow x 2  SABA use: none  02: no longer using any 02 x 3 weeks  - was on 1pm when stopped    No obvious day to day or daytime variability or assoc excess/ purulent sputum or mucus plugs or hemoptysis or cp or chest tightness, subjective wheeze or overt sinus or hb symptoms.   Sleeping flat  without nocturnal  or early am exacerbation  of respiratory  c/o's or need for noct saba. Also denies any obvious fluctuation of symptoms with weather or environmental changes or other aggravating or alleviating factors except as outlined above   No unusual exposure hx or h/o childhood pna/ asthma or knowledge of premature birth.  Current Allergies, Complete Past Medical History, Past Surgical History, Family History, and Social History were reviewed in [employer] record.  ROS  The following are not active complaints unless bolded Hoarseness, sore throat, dysphagia, dental problems, itching, sneezing,  nasal congestion or discharge of excess mucus or purulent  secretions, ear ache,   fever, chills, sweats, unintended wt loss or wt gain, classically pleuritic or exertional cp,  orthopnea pnd or arm/hand swelling  or leg swelling, presyncope, palpitations, abdominal pain, anorexia, nausea, vomiting, diarrhea  or change in bowel habits or change in bladder habits, change in stools or change in urine, dysuria, hematuria,  rash, arthralgias, visual complaints, headache, numbness, weakness or ataxia or problems with walking or coordination,  change in mood or  memory.        Current Meds  Medication Sig  . acetaminophen (TYLENOL) 500 MG tablet Take 1,000 mg by mouth every 8 (eight) hours as needed for moderate pain or headache.  . allopurinol (ZYLOPRIM) 100 MG tablet Take 100 mg by mouth daily.  Marland Kitchen amiodarone (PACERONE) 200 MG tablet Take 1 tablet (200 mg total) by mouth daily.  Marland Kitchen apixaban (ELIQUIS) 2.5 MG TABS tablet Take 1 tablet (2.5 mg total) by mouth 2 (two) times daily.  . Darbepoetin Alfa (ARANESP) 150 MCG/0.3ML SOSY injection Inject 0.3 mLs (150 mcg total) into the vein every Saturday with hemodialysis.  . Fluticasone-Umeclidin-Vilant (TRELEGY ELLIPTA) 100-62.5-25 MCG/INH AEPB Inhale 1 puff into the lungs every morning.  . furosemide (LASIX) 40 MG tablet Take 40 mg by mouth 2 (two) times daily.  . multivitamin (RENA-VIT) TABS tablet Take 1 tablet by mouth at bedtime.  . sevelamer carbonate (RENVELA) 800 MG tablet Take 1 tablet (800 mg total) by mouth 3 (three) times daily with meals.                  Objective:    11/05/2019      190   08/03/2019       195   06/27/2019         208   04/24/19 231 lb (104.8 kg)  04/17/19 225 lb (102.1 kg)  03/26/19 226 lb 3.1 oz (102.6 kg)      BP 130/60 (BP Location: Left Arm, Cuff Size: Normal)   Pulse 84   Temp (!) 97.2 F (36.2 C) (Temporal)   Ht 5\' 10"  (1.778 m)   Wt 190 lb (86.2 kg)   SpO2 94% Comment: on RA  BMI 27.26 kg/m         HEENT : pt wearing mask not removed for exam due to covid -  19 concerns.    NECK :  without JVD/Nodes/TM/ nl carotid upstrokes bilaterally   LUNGS: no acc muscle use,  Mild barrel  contour chest wall with bilateral  Distant bs s audible wheeze and  without cough on insp or exp maneuvers  and mild  Hyperresonant  to  percussion bilaterally     CV:  RRR  no s3 or murmur or increase in P2, and no edema   ABD:  soft and nontender with pos end  insp Hoover's  in the supine position. No bruits or organomegaly appreciated, bowel sounds nl  MS:   Nl gait/  ext warm without deformities, calf tenderness, cyanosis or clubbing No obvious joint restrictions   SKIN: warm and dry without lesions    NEURO:  alert, approp, nl sensorium with  no motor or cerebellar deficits apparent.                 CXR PA and Lateral:   11/05/2019 :    After HD 11/03/2019 I personally reviewed images and agree with radiology impression as follows:  1. Interval clearing of pleural effusions. 2. Stable chronic interstitial coarsening. 3. Stable right IJ catheter.         Assessment

## 2019-11-06 ENCOUNTER — Encounter: Payer: Self-pay | Admitting: Internal Medicine

## 2019-11-06 NOTE — Assessment & Plan Note (Addendum)
Onset 02/2019 secondary to cap, chf/ copd HC03 05/04/2019 = 32  HCO3  06/14/19   = 25  - Pt d/c'd 02 on his own end of Oct 2020   He probably no longer needs 02 at rest or with activity but advised Make sure you check your oxygen saturations at highest level of activity to be sure it stays over 90% and if need 02 then adjust upward to maintain this level if needed but remember to turn it back to previous settings when you stop (to conserve your supply).   Also advised to at least  have wife check it while asleep  To assure  > 90% may need formal 24 h check to be sure it's safe to d/c 02 permanently.   >>> f/u can be prn if Dr Alyson Ingles willing to refill his Trelegy    I had an extended discussion with the patient reviewing all relevant studies completed to date and  lasting 15 to 20 minutes of a 25 minute visit    Each maintenance medication was reviewed in detail including most importantly the difference between maintenance and prns and under what circumstances the prns are to be triggered using an action plan format that is not reflected in the computer generated alphabetically organized AVS.     Please see AVS for specific instructions unique to this visit that I personally wrote and verbalized to the the pt in detail and then reviewed with pt  by my nurse highlighting any  changes in therapy recommended at today's visit to their plan of care.

## 2019-11-06 NOTE — Assessment & Plan Note (Signed)
Quit smoking 1983  - PFTs 07/16/2013  FEV1  1.75 (61%) ratio 52 and 15% better p B2, DLCO 74% - 06/27/2019  After extensive coaching inhaler device,  effectiveness =    90% with dpi so changed to trelegy maint and proair respiclick> improved as of ov 08/03/2019    Group D in terms of symptom/risk and laba/lama/ICS  therefore appropriate rx at this point >>>  Improved on trelegy, no change needed  Advised:  formulary restrictions will be an ongoing challenge for the forseable future and I would be happy to pick an alternative if the pt will first  provide me a list of them -  pt  will need to return here for training for any new device that is required eg dpi vs hfa vs respimat.    In the meantime we can always provide samples so that the patient never runs out of any needed respiratory medications.   Pt informed of the seriousness of COVID 19 infection as a direct risk to their health  and safey and to those of their loved ones and should continue to wear facemask in public and minimize exposure to public locations but especially avoid any area or activity where non-close contacts are not observing distancing or wearing an appropriate face mask.

## 2019-11-06 NOTE — Assessment & Plan Note (Signed)
R throaceneteis 05/21/19   =  1.85 l slt bloody exudative, nl glucose, nl cells on path review Repeat 06/02/19 x 1.4 liters Repeat 6/21/0  x 2.3 liters dark red  cyt neg  11/05/2019 resolved as of 11/05/2019 p  HD 11/03/2019   Despite this being he longest window of the week s HD (since it's a Monday and he 55 T TH SAT his cxr looks fine now so no further pulmonary f/u for this problem is needed

## 2019-12-05 ENCOUNTER — Telehealth: Payer: Self-pay | Admitting: Cardiovascular Disease

## 2019-12-05 NOTE — Telephone Encounter (Signed)
   [facility] Pre-operative Risk Assessment    Request for surgical clearance:  1. What type of surgery is being performed? Insertion of catheder into stomach    2. When is this surgery scheduled? 01-01-20  3. What type of clearance is required (medical clearance vs. Pharmacy clearance to hold med vs. Both)? Both  4. Are there any medications that need to be held prior to surgery and how long? Eliquis TBD by Dr. Gwenlyn Found  5. Practice name and name of physician performing surgery? Dr. Demetrius Revel, [facility]  6. What is your office phone number: [phone]   7.   What is your office fax number: pt was not given one   8.   Anesthesia type (None, local, MAC, general) ? General  Patient is calling in regards to clearance for this procedure.    David Barton 12/05/2019, 1:35 PM  _________________________________________________________________   (provider comments below)

## 2019-12-05 NOTE — Telephone Encounter (Signed)
Patient with diagnosis of atrial fibrillation on Eliquis for anticoagulation.    Procedure: catheter insertion into stomach Date of procedure: 01-01-20  CHADS2-VASc score of  4  (CHF, HTN, AGE x 2)  CrCl 20.7 Platelet count 263  Per office protocol, patient can hold Eliquis for 2 days prior to procedure.

## 2019-12-05 NOTE — Telephone Encounter (Signed)
   Primary Cardiologist: Quay Burow, MD  Chart reviewed as part of pre-operative protocol coverage. Given past medical history and time since last visit, based on ACC/AHA guidelines, David Barton would be at acceptable risk for the planned procedure without further cardiovascular testing.   Patient was seen by Dr. Gwenlyn Found 09/2019 and was without CV symptoms. His fluid volume was managed with HD. He had been dx with new onset AF earlier in the year, however was well controlled on Amiodarone and Eliquis.   Patient with diagnosis of atrial fibrillation on Eliquis for anticoagulation.    Procedure: catheter insertion into stomach Date of procedure: 01-01-20  CHADS2-VASc score of  4  (CHF, HTN, AGE x 2)  CrCl 20.7 Platelet count 263  Per office protocol, patient can hold Eliquis for 2 days prior to procedure.  I will route this recommendation to the requesting party via Epic fax function and remove from pre-op pool.  Please call with questions.  Kathyrn Drown, NP 12/05/2019, 3:30 PM

## 2019-12-10 ENCOUNTER — Other Ambulatory Visit: Payer: Self-pay | Admitting: Cardiology

## 2019-12-26 ENCOUNTER — Telehealth: Payer: Self-pay | Admitting: Internal Medicine

## 2019-12-26 DIAGNOSIS — J9611 Chronic respiratory failure with hypoxia: Secondary | ICD-10-CM

## 2019-12-26 DIAGNOSIS — J9612 Chronic respiratory failure with hypercapnia: Secondary | ICD-10-CM

## 2019-12-26 NOTE — Telephone Encounter (Signed)
Fine with me to d/c 02

## 2019-12-26 NOTE — Telephone Encounter (Signed)
Order sent to [facility] and spoke with the pt and notified that this was done

## 2019-12-26 NOTE — Telephone Encounter (Signed)
Spoke with the pt  He states has not used o2 in the past 2 months  He has had wife check his sats while sleeping and they range from 88-94%  He wants to know if he can have o2 picked up  Please advise thanks!

## 2020-03-01 ENCOUNTER — Other Ambulatory Visit: Payer: Self-pay | Admitting: Cardiology

## 2020-03-31 ENCOUNTER — Telehealth: Payer: Self-pay | Admitting: Internal Medicine

## 2020-03-31 NOTE — Telephone Encounter (Signed)
Would require ov with formulary in hand so give a sample x 2 weeks and see me or NP to review cheaper options / teaching new device

## 2020-03-31 NOTE — Telephone Encounter (Signed)
Called and spoke with pt. Pt wants to know if there is a different medication that he could take in place of the trelegy as trelegy is too expensive for him.  Dr. Melvyn Novas, please advise.

## 2020-03-31 NOTE — Telephone Encounter (Signed)
Attempted to call pt but unable to reach. Left message for pt to return call. 

## 2020-04-01 MED ORDER — TRELEGY ELLIPTA 100-62.5-25 MCG/INH IN AEPB: 1.0000 | INHALATION_SPRAY | Freq: Every day | RESPIRATORY_TRACT | 0 refills | Status: DC

## 2020-04-01 NOTE — Telephone Encounter (Signed)
Patient is returning phone call. Patient phone number is [phone].

## 2020-04-01 NOTE — Telephone Encounter (Signed)
Called and spoke with Patient.  Dr. Gustavus Bryant recommendations given.  Understanding stated.  Patient is aware to bring formulary to OV with Dr. Melvyn Novas scheduled 04/14/20 at 4pm.  Trelegy sample placed at front door for Patient pickup. Nothing further at this time.

## 2020-04-01 NOTE — Telephone Encounter (Signed)
LMTCB x2 for pt 

## 2020-04-09 ENCOUNTER — Ambulatory Visit: Payer: Medicare Other | Admitting: Cardiology

## 2020-04-09 ENCOUNTER — Other Ambulatory Visit: Payer: Self-pay

## 2020-04-09 ENCOUNTER — Encounter: Payer: Self-pay | Admitting: Cardiology

## 2020-04-09 VITALS — BP 114/52 | HR 72 | Ht 70.0 in | Wt 183.0 lb

## 2020-04-09 DIAGNOSIS — J9611 Chronic respiratory failure with hypoxia: Secondary | ICD-10-CM

## 2020-04-09 DIAGNOSIS — N186 End stage renal disease: Secondary | ICD-10-CM | POA: Diagnosis not present

## 2020-04-09 DIAGNOSIS — Z7901 Long term (current) use of anticoagulants: Secondary | ICD-10-CM | POA: Diagnosis not present

## 2020-04-09 DIAGNOSIS — J9612 Chronic respiratory failure with hypercapnia: Secondary | ICD-10-CM

## 2020-04-09 DIAGNOSIS — I429 Cardiomyopathy, unspecified: Secondary | ICD-10-CM | POA: Diagnosis not present

## 2020-04-09 DIAGNOSIS — Z9889 Other specified postprocedural states: Secondary | ICD-10-CM | POA: Diagnosis not present

## 2020-04-09 DIAGNOSIS — I480 Paroxysmal atrial fibrillation: Secondary | ICD-10-CM

## 2020-04-09 DIAGNOSIS — J90 Pleural effusion, not elsewhere classified: Secondary | ICD-10-CM

## 2020-04-09 NOTE — Assessment & Plan Note (Signed)
Last EF 45-50% by echo June 2020

## 2020-04-09 NOTE — Assessment & Plan Note (Signed)
R throaceneteis 05/21/19   =  1.85 l slt bloody exudative, nl glucose, nl cells on path review Repeat 06/02/19 x 1.4 liters Repeat 6/21/0  x 2.3 liters dark red  cyt neg  11/05/2019 resolved as of 11/05/2019 p  HD 11/03/2019  No effusion on exam today

## 2020-04-09 NOTE — Assessment & Plan Note (Signed)
Low dose Eliquis

## 2020-04-09 NOTE — Progress Notes (Signed)
Cardiology Office Note:    Date:  04/09/2020   ID:  David Barton, DOB [birth year], MRN [id number]  PCP:  Maury Dus, MD  Cardiologist:  Quay Burow, MD  Electrophysiologist:  None   Referring MD: Maury Dus, MD   No chief complaint on file. routine 6 month follow up  History of Present Illness:    David Barton is a pleasant 86 y.o. male with a hx of presumed nonischemic cardiomyopathy, COPD, PAF, and end-stage renal disease.  Patient had a cardiomyopathy in the setting of sepsis in 2018 which subsequently improved.  He was admitted in March 2020 with respiratory failure secondary to CAP.  During that admission he had septic shock as well as atrial fibrillation with RVR and acute diastolic heart failure.  Cardioversion was attempted then but failed.  He was placed on amiodarone with plans for outpatient cardio version in 8 to 12 weeks but because of Covid this was delayed.  He was admitted again in June 2020 with respiratory failure.  He had a right pleural effusion which required thoracentesis of 1.8 L.  By this time he was now on dialysis, recently he has been transition to peritoneal dialysis.Marland Kitchen    He saw Dr. Gwenlyn Found in October 2020.  EKG 10/12/2019 appears to show atrial fibrillation with right bundle branch block and controlled ventricular response.  The patient is seen today for 72-month follow-up.  He has been doing well since we last saw him, he is very happy with peritoneal dialysis.  He denies any episodes of tachycardia or increased dyspnea.  His EKG today is a little difficult to interpret but appears to show sinus rhythm with first-degree AV block and right bundle branch block.  His rate is 72.  Past Medical History:  Diagnosis Date  . Alcohol abuse   . Colon cancer ([place])   . COPD (chronic obstructive pulmonary disease) ([place])   . Emphysema of lung ([place])   . ESRD (end stage renal disease) ([place])   . Former tobacco use   . Hypertension   . Peripheral edema   . Sepsis  ([place]) 10/2016   Aspiration PNA/C diff colitis    Past Surgical History:  Procedure Laterality Date  . A/V FISTULAGRAM N/A 10/19/2019   Procedure: A/V FISTULAGRAM- Left Arm;  Surgeon: Angelia Mould, MD;  Location: [facility];  Service: Cardiovascular;  Laterality: N/A;  . AV FISTULA PLACEMENT Left 06/14/2019   Procedure: Creation of Left arm arteriovenous fistula;  Surgeon: Rosetta Posner, MD;  Location: [place];  Service: Vascular;  Laterality: Left;  . CARDIOVERSION N/A 03/16/2019   Procedure: CARDIOVERSION;  Surgeon: Pixie Casino, MD;  Location[facility] ENDOSCOPY;  Service: Cardiovascular;  Laterality: N/A;  . CARDIOVERSION N/A 03/23/2019   Procedure: CARDIOVERSION;  Surgeon: Lelon Perla, MD;  Location: [facility] ENDOSCOPY;  Service: Cardiovascular;  Laterality: N/A;  . CATARACT EXTRACTION    . COLON RESECTION    . COLONOSCOPY    . IMPLANTATION BONE ANCHORED HEARING AID Left 2016  . IR FLUORO GUIDE CV LINE RIGHT  06/09/2019  . IR THORACENTESIS ASP PLEURAL SPACE W/IMG GUIDE  05/21/2019  . IR US GUIDE VASC ACCESS RIGHT  06/09/2019  . MINOR HEMORRHOIDECTOMY  1995  . TEE WITHOUT CARDIOVERSION N/A 03/16/2019   Procedure: TRANSESOPHAGEAL ECHOCARDIOGRAM (TEE);  Surgeon: Pixie Casino, MD;  Location: [facility] ENDOSCOPY;  Service: Cardiovascular;  Laterality: N/A;  . TONSILLECTOMY      Current Medications: Current Meds  Medication Sig  .  acetaminophen (TYLENOL) 500 MG tablet Take 1,000 mg by mouth every 8 (eight) hours as needed for moderate pain or headache.  . allopurinol (ZYLOPRIM) 100 MG tablet Take 100 mg by mouth daily.  Marland Kitchen amiodarone (PACERONE) 200 MG tablet TAKE 1 TABLET(200 MG) BY MOUTH DAILY  . apixaban (ELIQUIS) 2.5 MG TABS tablet Take 1 tablet (2.5 mg total) by mouth 2 (two) times daily.  . Fluticasone-Umeclidin-Vilant (TRELEGY ELLIPTA) 100-62.5-25 MCG/INH AEPB Inhale 1 puff into the lungs every morning.  . multivitamin (RENA-VIT) TABS tablet Take 1 tablet by mouth at bedtime.   . sevelamer carbonate (RENVELA) 800 MG tablet Take 1 tablet (800 mg total) by mouth 3 (three) times daily with meals.     Allergies:   Patient has no known allergies.   Social History   Socioeconomic History  . Marital status: Married    Spouse name: Not on file  . Number of children: 3  . Years of education: Not on file  . Highest education level: Not on file  Occupational History  . Occupation: Retired- Associate Professor  Tobacco Use  . Smoking status: Former Smoker    Packs/day: 1.00    Years: 20.00    Pack years: 20.00    Types: Cigarettes    Quit date: 12/20/1981    Years since quitting: 38.3  . Smokeless tobacco: Never Used  Substance and Sexual Activity  . Alcohol use: Yes    Alcohol/week: 7.0 standard drinks    Types: 7 Standard drinks or equivalent per week    Comment: states he drinks 3-4 days a week, "a couple" on those occasions but does not further quaniity.  . Drug use: No  . Sexual activity: Never  Other Topics Concern  . Not on file  Social History Narrative  . Not on file   Social Determinants of Health   Financial Resource Strain:   . Difficulty of Paying Living Expenses:   Food Insecurity: Unknown  . Worried About Charity fundraiser in the Last Year: Patient refused  . Ran Out of Food in the Last Year: Patient refused  Transportation Needs: Unknown  . Lack of Transportation (Medical): Patient refused  . Lack of Transportation (Non-Medical): Patient refused  Physical Activity: Unknown  . Days of Exercise per Week: Patient refused  . Minutes of Exercise per Session: Patient refused  Stress:   . Feeling of Stress :   Social Connections: Unknown  . Frequency of Communication with Friends and Family: Patient refused  . Frequency of Social Gatherings with Friends and Family: Patient refused  . Attends Religious Services: Patient refused  . Active Member of Clubs or Organizations: Patient refused  . Attends Archivist Meetings:  Patient refused  . Marital Status: Patient refused     Family History: The patient's family history includes Brain cancer in his father; Colon cancer in his mother; Lung cancer in his brother.  ROS:   Please see the history of present illness.     All other systems reviewed and are negative.  EKGs/Labs/Other Studies Reviewed:    The following studies were reviewed today: Echo June 2020- EF 45-50%  EKG:  EKG is ordered today.  The ekg ordered today demonstrates NSR, 1st AVB, RBBB  Recent Labs: 05/04/2019: Pro B Natriuretic peptide (BNP) 443.0 05/19/2019: B Natriuretic Peptide 465.7 05/31/2019: ALT 12 06/09/2019: TSH 3.913 06/14/2019: Magnesium 1.8 08/20/2019: Platelets 263 10/19/2019: BUN 32; Creatinine, Ser 3.30; Hemoglobin 13.6; Potassium 3.7; Sodium 135  Recent Lipid Panel  Component Value Date/Time   CHOL 106 11/10/2016 0331   TRIG 100 03/05/2019 0539   HDL 60 11/10/2016 0331   CHOLHDL 1.8 11/10/2016 0331   VLDL 17 11/10/2016 0331   LDLCALC 29 11/10/2016 0331    Physical Exam:    VS:  BP (!) 114/52   Pulse 72   Ht 5\' 10"  (1.778 m)   Wt 183 lb (83 kg)   BMI 26.26 kg/m     Wt Readings from Last 3 Encounters:  04/09/20 183 lb (83 kg)  11/05/19 190 lb (86.2 kg)  10/19/19 190 lb (86.2 kg)     GEN:  Well nourished, well developed in no acute distress HEENT: Normal NECK: No JVD; No carotid bruits LYMPHATICS: No lymphadenopathy CARDIAC: RRR, no murmurs, rubs, gallops RESPIRATORY:  Decreased breath sounds c/w COPD ABDOMEN: Soft, non-tender, non-distended MUSCULOSKELETAL:  No edema; No deformity  SKIN: Warm and dry NEUROLOGIC:  Alert and oriented x 3 PSYCHIATRIC:  Normal affect   ASSESSMENT:    Chronic respiratory failure with hypoxia ([name]) Onset 02/2019 secondary to cap, chf/ copd- currently stable, followed by Dr Melvyn Novas  PAF (paroxysmal atrial fibrillation) ([place]) New onset 02/2019 in setting of CAP , respiratory failure, septic shock, AKI. Failed  cardioversion twice during March admission- Amio added then with plans to attempt eventual DCCV (delayed secondary to COPD, and COVID).  Meanwhile patient admitted again with respiratory failure in June 2020. He eventually appears to have converted to NSR on Amiodarone.  Chronic anticoagulation Low dose Eliquis  Chronic bilateral pleural effusions  R>L R throaceneteis 05/21/19   =  1.85 l slt bloody exudative, nl glucose, nl cells on path review Repeat 06/02/19 x 1.4 liters Repeat 6/21/0  x 2.3 liters dark red  cyt neg  11/05/2019 resolved as of 11/05/2019 p  HD 11/03/2019  No effusion on exam today  ESRD (end stage renal disease) ([place]) Now on peritoneal dialysis- Dr Deterding follows  Cardiomyopathy [facility]) Last EF 45-50% by echo June 2020  PLAN:    Same cardiac Rx- I will see if we can provide him some samples of Eliquis as he is having some trouble with the cost.  He declined transition to warfarin.  F/U Dr Gwenlyn Found in 6 months.    Medication Adjustments/Labs and Tests Ordered: Current medicines are reviewed at length with the patient today.  Concerns regarding medicines are outlined above.  Orders Placed This Encounter  Procedures  . EKG 12-Lead   No orders of the defined types were placed in this encounter.   Patient Instructions  Medication Instructions:  Your physician recommends that you continue on your current medications as directed. Please refer to the Current Medication list given to you today.  *If you need a refill on your cardiac medications before your next appointment, please call your pharmacy*    Follow-Up: At [facility], you and your health needs are our priority.  As part of our continuing mission to provide you with exceptional heart care, we have created designated Provider Care Teams.  These Care Teams include your primary Cardiologist (physician) and Advanced Practice Providers (APPs -  Physician Assistants and Nurse Practitioners) who all work together  to provide you with the care you need, when you need it.  We recommend signing up for the patient portal called "MyChart".  Sign up information is provided on this After Visit Summary.  MyChart is used to connect with patients for Virtual Visits (Telemedicine).  Patients are able to view lab/test results, encounter notes,  upcoming appointments, etc.  Non-urgent messages can be sent to your provider as well.   To learn more about what you can do with MyChart, go to [email or web address].    Your next appointment:   6 month(s)  The format for your next appointment:   In Person  Provider:   Quay Burow, MD   Other Instructions Please call our office 2 months in advance to schedule your follow-up appointment with Dr. Gwenlyn Found.     Angelena Form, PA-C  04/09/2020 10:09 AM    [facility]

## 2020-04-09 NOTE — Assessment & Plan Note (Signed)
Onset 02/2019 secondary to cap, chf/ copd- currently stable, followed by Dr Melvyn Novas

## 2020-04-09 NOTE — Patient Instructions (Signed)
Medication Instructions:  Your physician recommends that you continue on your current medications as directed. Please refer to the Current Medication list given to you today.  *If you need a refill on your cardiac medications before your next appointment, please call your pharmacy*    Follow-Up: At [facility], you and your health needs are our priority.  As part of our continuing mission to provide you with exceptional heart care, we have created designated Provider Care Teams.  These Care Teams include your primary Cardiologist (physician) and Advanced Practice Providers (APPs -  Physician Assistants and Nurse Practitioners) who all work together to provide you with the care you need, when you need it.  We recommend signing up for the patient portal called "MyChart".  Sign up information is provided on this After Visit Summary.  MyChart is used to connect with patients for Virtual Visits (Telemedicine).  Patients are able to view lab/test results, encounter notes, upcoming appointments, etc.  Non-urgent messages can be sent to your provider as well.   To learn more about what you can do with MyChart, go to [email or web address].    Your next appointment:   6 month(s)  The format for your next appointment:   In Person  Provider:   Quay Burow, MD   Other Instructions Please call our office 2 months in advance to schedule your follow-up appointment with Dr. Gwenlyn Found.

## 2020-04-09 NOTE — Assessment & Plan Note (Signed)
Now on peritoneal dialysis- Dr Deterding follows

## 2020-04-09 NOTE — Assessment & Plan Note (Signed)
New onset 02/2019 in setting of CAP , respiratory failure, septic shock, AKI. Failed cardioversion twice during March admission- Amio added then with plans to attempt eventual DCCV (delayed secondary to COPD, and COVID).  Meanwhile patient admitted again with respiratory failure in June 2020. He eventually appears to have converted to NSR on Amiodarone.

## 2020-04-14 ENCOUNTER — Ambulatory Visit: Payer: Medicare Other | Admitting: Internal Medicine

## 2020-05-28 ENCOUNTER — Other Ambulatory Visit: Payer: Self-pay | Admitting: Nephrology

## 2020-05-28 DIAGNOSIS — R7989 Other specified abnormal findings of blood chemistry: Secondary | ICD-10-CM

## 2020-06-03 ENCOUNTER — Ambulatory Visit
Admission: RE | Admit: 2020-06-03 | Discharge: 2020-06-03 | Disposition: A | Discharge status: Home / Self Care | Payer: Medicare Other | Source: Ambulatory Visit | Attending: Nephrology | Admitting: Nephrology

## 2020-06-03 DIAGNOSIS — R7989 Other specified abnormal findings of blood chemistry: Secondary | ICD-10-CM

## 2020-06-03 IMAGING — DX PORTABLE CHEST - 1 VIEW
1 series · 1 of 1 positions shown · non-contrast
Comparison: 03/05/2019.

CLINICAL DATA: Shortness of breath.

EXAM:
PORTABLE CHEST 1 VIEW

[chest ap]
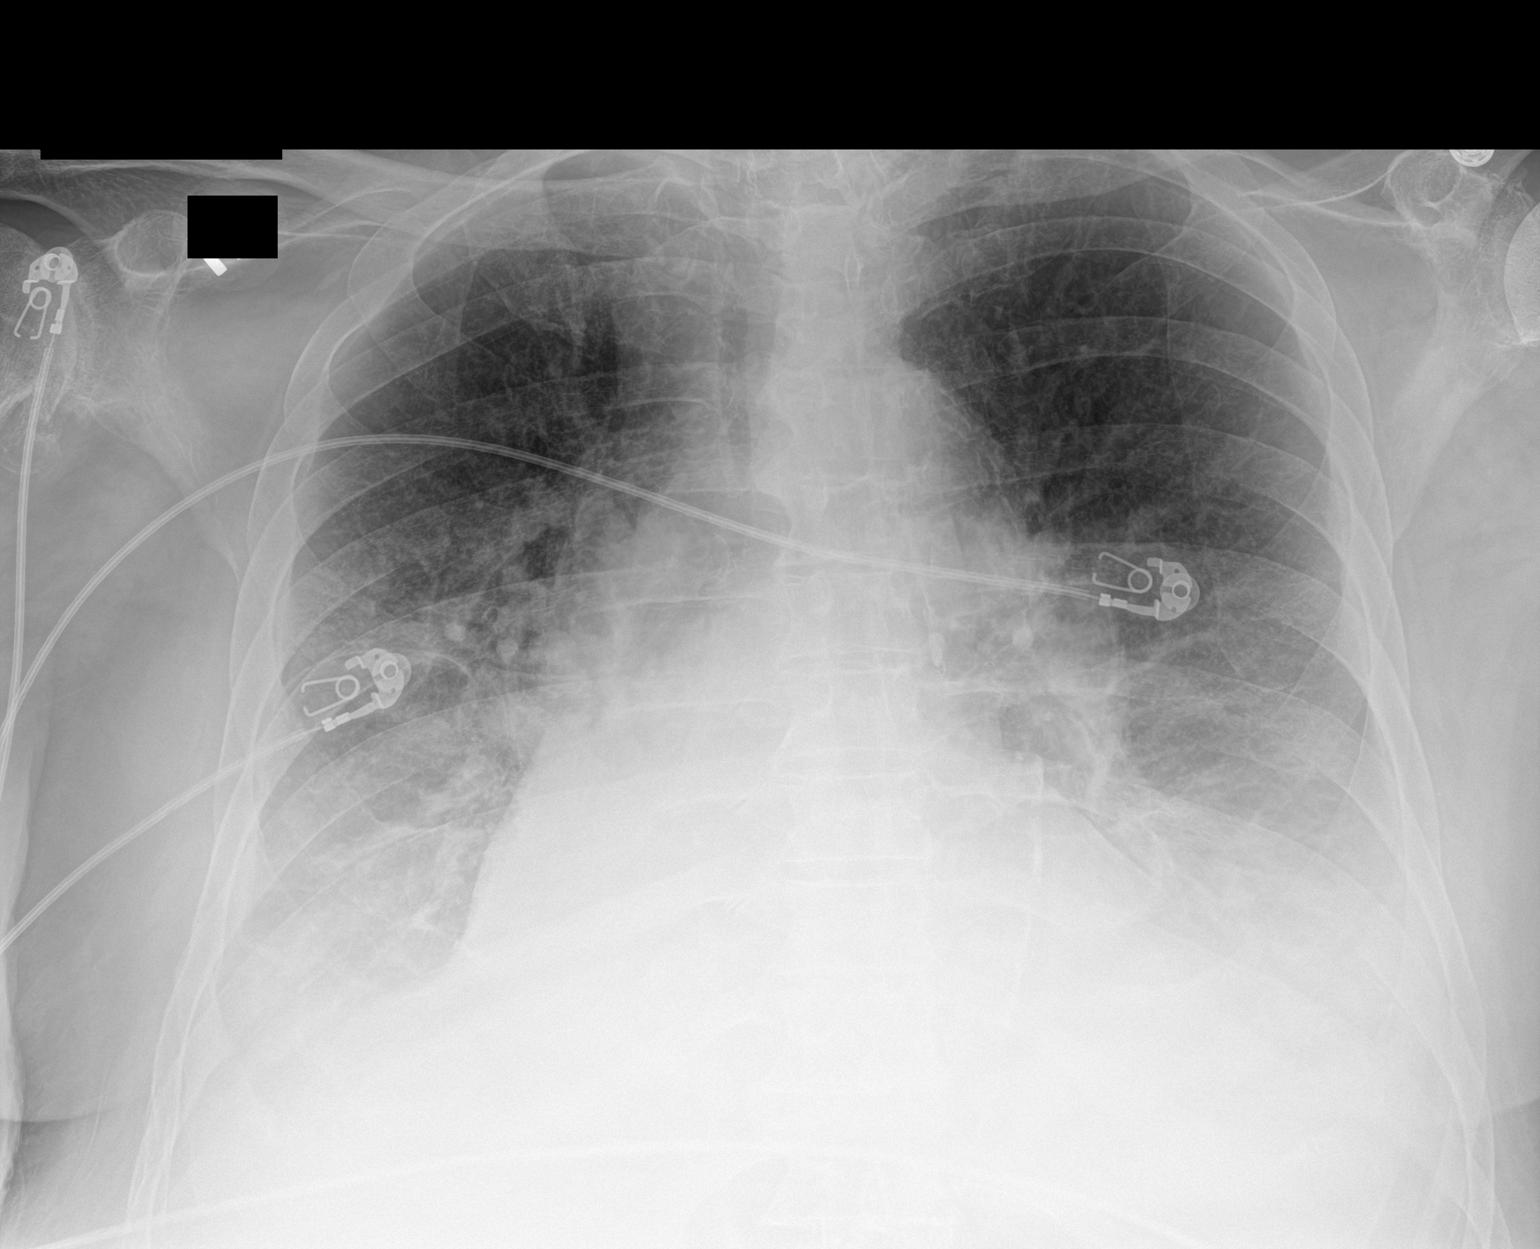

[1 of 1 positions shown; findings below may reference images not displayed]

FINDINGS: Cardiomegaly with diffuse bilateral pulmonary infiltrates/edema and
bilateral pleural effusions. No pneumothorax.
IMPRESSION: Cardiomegaly with diffuse bilateral pulmonary infiltrates/edema and
bilateral pleural effusions. Similar findings noted on prior exam.
Findings consistent with CHF.

## 2020-06-17 IMAGING — CR CHEST - 2 VIEW
3 series · 3 of 3 positions shown · non-contrast
Comparison: 03/21/2019

CLINICAL DATA: Shortness of breath, cough

EXAM:
CHEST - 2 VIEW

[chest lat (1 of 2)]
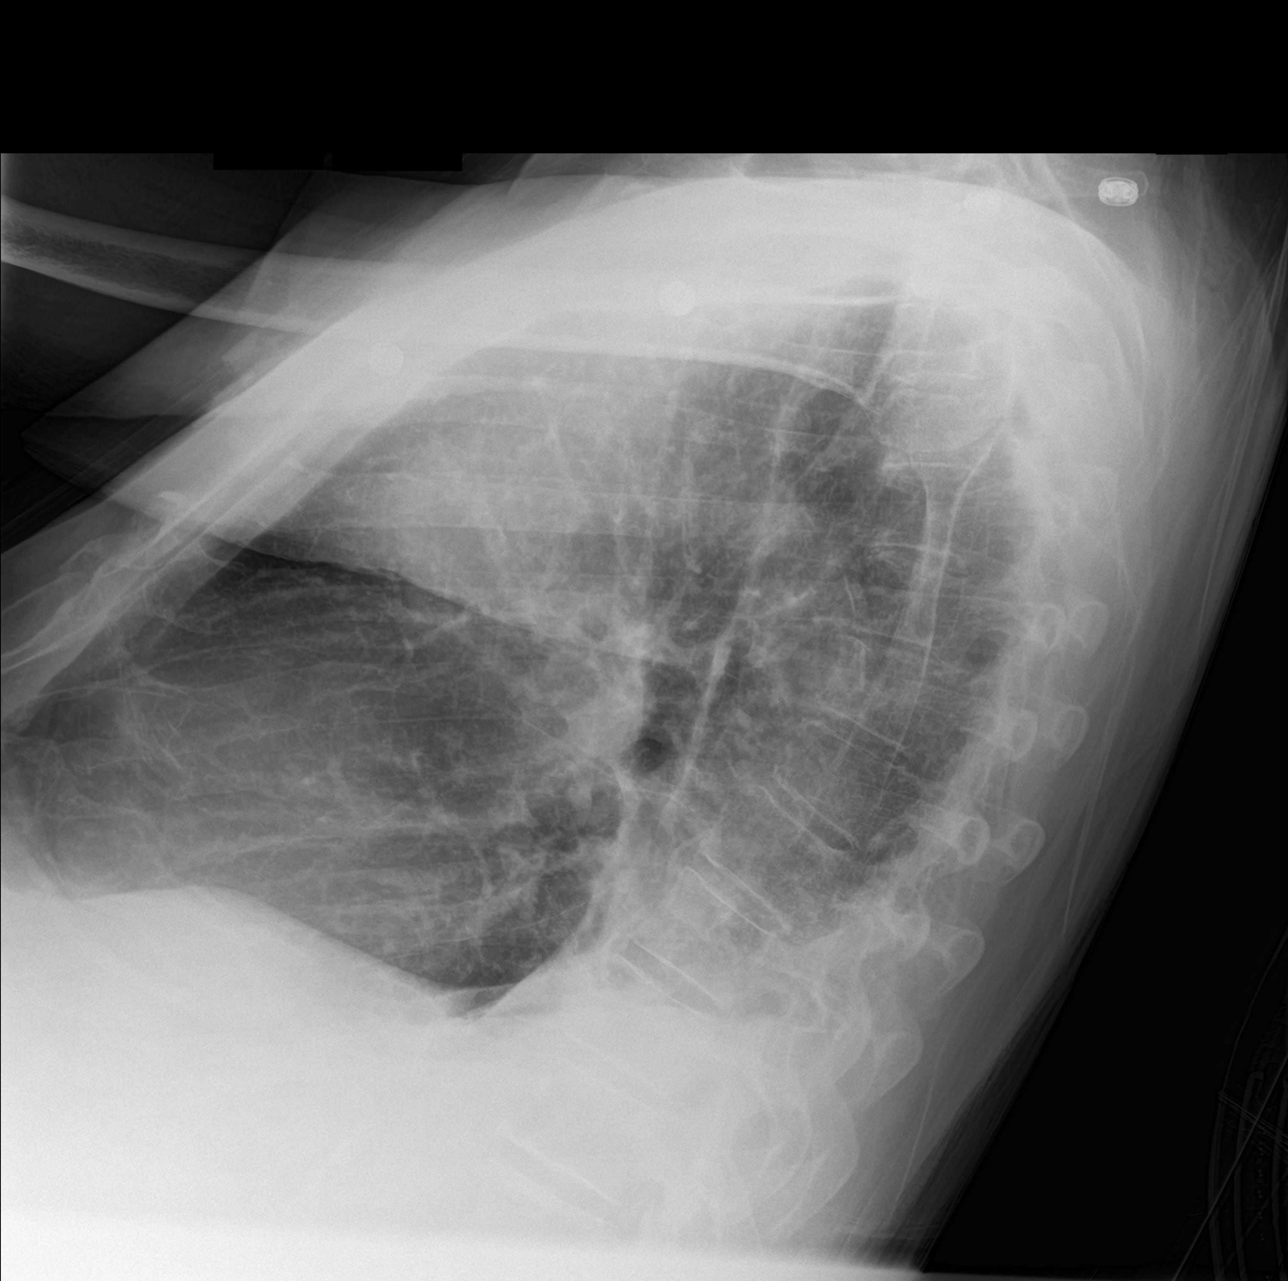

[chest ap]
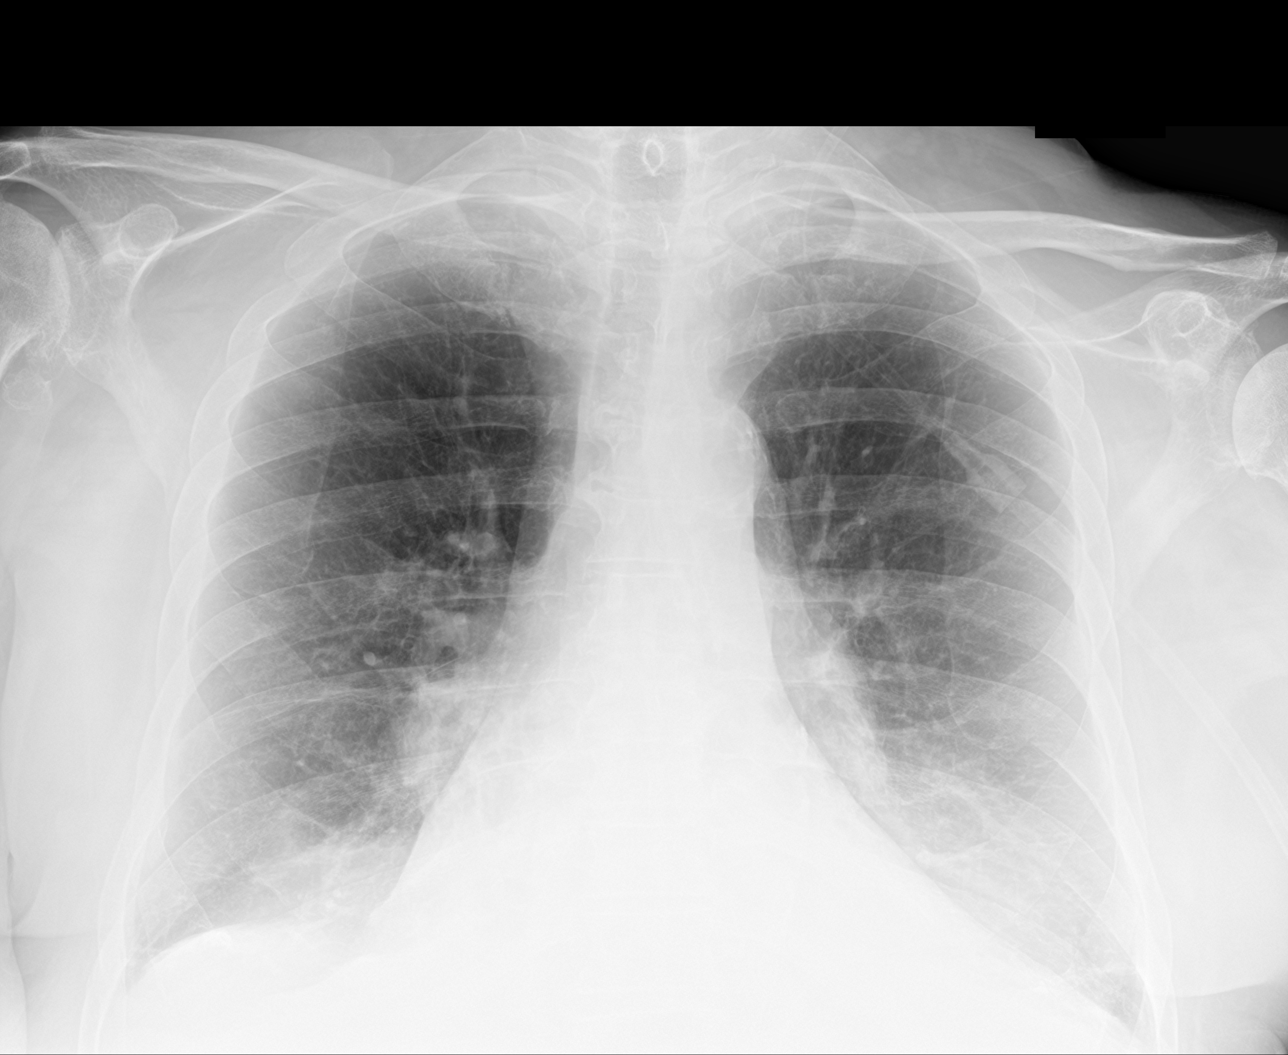

[chest lat (2 of 2)]
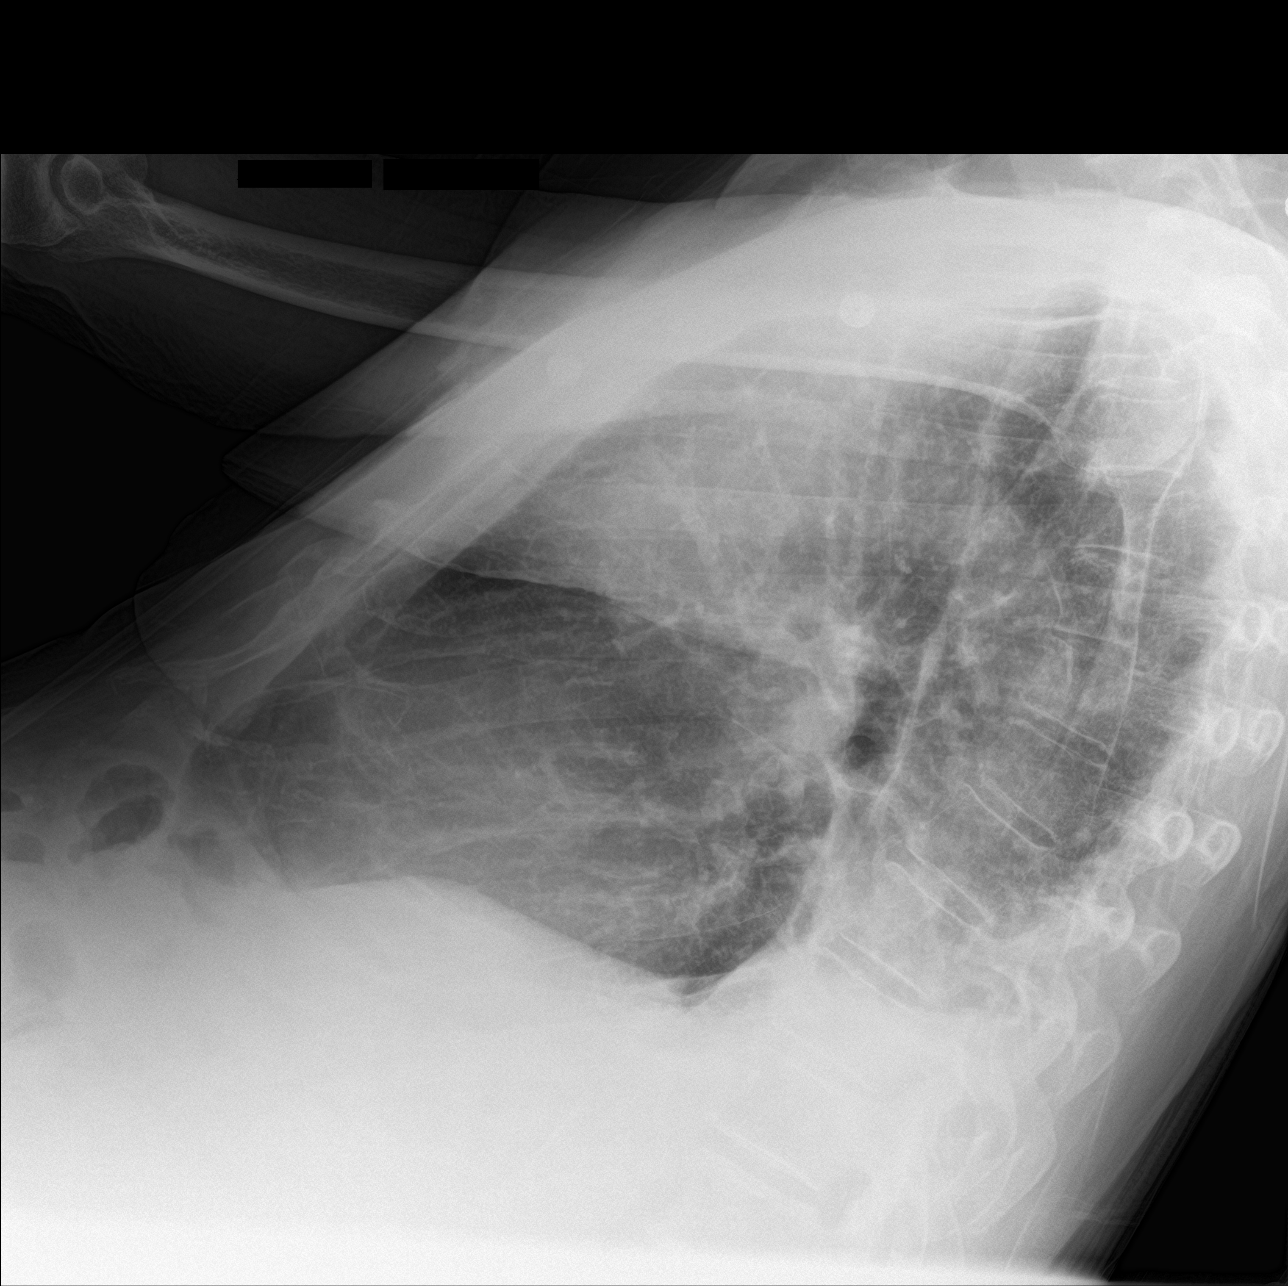

[3 of 3 positions shown; findings below may reference images not displayed]

FINDINGS: Small to moderate layering bilateral effusions. Bibasilar
atelectasis or infiltrates. Mild cardiomegaly. No overt edema. No
acute bony abnormality.
IMPRESSION: Layering bilateral effusions with bibasilar atelectasis or
infiltrates.

## 2020-06-24 ENCOUNTER — Telehealth: Payer: Self-pay | Admitting: Internal Medicine

## 2020-06-24 NOTE — Telephone Encounter (Signed)
Patient calling for medication alternative to Trelegy, he states he can no longer afford the medication. Please advise.

## 2020-06-25 MED ORDER — TRELEGY ELLIPTA 100-62.5-25 MCG/INH IN AEPB: 1.0000 | INHALATION_SPRAY | Freq: Every day | RESPIRATORY_TRACT | 0 refills | Status: DC

## 2020-06-25 NOTE — Telephone Encounter (Signed)
Give samples until can be scheduled with our pharmacist our NP with formulary in hand

## 2020-06-25 NOTE — Telephone Encounter (Signed)
Called and spoke with pt letting him know the info stated by MW that we needed to schedule an appt with APP for med management and also needed him to contact insurance company to get them to provide him a list of covered inhalers. Pt verbalized understanding. Pt has been scheduled for an appt with Beth 7/13 and also have placed sample of Trelegy up front for pt. Nothing further needed.

## 2020-07-01 ENCOUNTER — Encounter: Payer: Self-pay | Admitting: Primary Care

## 2020-07-01 ENCOUNTER — Other Ambulatory Visit: Payer: Self-pay

## 2020-07-01 ENCOUNTER — Ambulatory Visit: Payer: Medicare Other | Admitting: Primary Care

## 2020-07-01 ENCOUNTER — Telehealth: Payer: Self-pay | Admitting: Primary Care

## 2020-07-01 VITALS — BP 112/68 | HR 95 | Temp 97.7°F | Ht 70.0 in | Wt 185.0 lb

## 2020-07-01 DIAGNOSIS — J449 Chronic obstructive pulmonary disease, unspecified: Secondary | ICD-10-CM | POA: Diagnosis not present

## 2020-07-01 MED ORDER — STIOLTO RESPIMAT 2.5-2.5 MCG/ACT IN AERS: 2.0000 | INHALATION_SPRAY | Freq: Every day | RESPIRATORY_TRACT | 0 refills | Status: DC

## 2020-07-01 NOTE — Telephone Encounter (Addendum)
Can you do a benefit investigation for possible alternative to Trelegy. He is doing very well so we may try to step down therapy to LABA/LAMA such as Stiolto.   Current prescription cost patient too much, states that it used to be 45 dollars and now over a 160 dollars a month. He thinks this is because he is on less medication. He is on Eliquis. He thinks he has spent 3,000 out of pocket this year.

## 2020-07-01 NOTE — Assessment & Plan Note (Addendum)
Patient is doing extremly well. He can no longer afford Trelegy. We will try and step down therapy to LABA/LAMA. Patient will be given sample of Stiolto today to see how he does on this. Benefits investigation showed he is in coverage gap. He will need to apply for patient assistance for any of the LABA/LAMA inhalers if he tolerate step down OR Trelegy/Breztri. If patients breathing worsens advised him to notify office sooner than follow-up visit in 2 weeks..   Recommendations Stop Trelegy Start Stiolto Respimat 2 puffs once daily in the morning (he will need patient assistance to be started at next visit if he tolerate)  Follow-up 2 weeks with Eustaquio Maize NP

## 2020-07-01 NOTE — Telephone Encounter (Signed)
Thank, will do at his follow-up. Ill see how he does on LABA/LAMA

## 2020-07-01 NOTE — Telephone Encounter (Signed)
Ran test claims for 1 month supply:  Trelegy- $160.94 Breztri- $157.88  Stiolto- $117.30 Anoro- $116.28 Bevespi- $105.74  Patient is in the Medicare coverage gap, which affects all copays.Patient should apply for patient assistance for chosen medication.

## 2020-07-01 NOTE — Progress Notes (Signed)
@Patient  ID: David Barton, male    DOB: 11-15-1933, 86 y.o.   MRN: [id number]  Chief Complaint  Patient presents with  . Follow-up    Referring provider: Maury Dus, MD  HPI: 86 year old male, former smoker quit 1983 (20-pack-year history).  Past medical history significant for COPD Gold 2, chronic respiratory failure with hypoxia, chronic bilateral pleural effusions right greater than left, sleep apnea, chronic diastolic heart failure, cardiomyopathy, hypertension, proximal A. Fib, dysphagia, end-stage renal disease.  Patient of Dr. Melvyn Novas, last seen November 05, 2019.  Patient called our office last week asking for alternative medication to trilogy, patient states he can no longer afford medication.  Patient stopped oxygen in October 2020.    07/01/2020 Patient presents today for medication management.  Patient has stage II COPD.  Patient is actually doing extremely well today with no acute complaints.  He does not have any shortness of breath, wheezing, chest tightness or cough.  His current prescription for trilogy is costing him around $160 a month.  Previous to this it was $45 a month.  He feels this is because he is on less medication and is not meeting his deductible as soon as he was previous years.  He has spent close to $3000 in pocket on medication.  He is on Eliquis for proximal A. Fib.  He remains on home dialysis.   Pulmonary function testing: PFTs 07/16/2013  FEV1  1.75 (61%) ratio 52 and 15% better p B2, DLCO 74%  Imaging: CXR PA and Lateral:   11/05/2019 :    After HD 11/03/2019- Interval clearing of pleural effusions. Stable chronic interstitial coarsening. Stable right IJ catheter.   Significant events: R throaceneteis 05/21/19 =  1.85 l slt bloody exudative, nl glucose, nl cells on path review Repeat 06/02/19 x 1.4 liters Repeat 6/21/0  x 2.3 liters dark red  cyt neg  11/05/2019 resolved as of 11/05/2019 p  HD 11/03/2019   No Known Allergies  Immunization  History  Administered Date(s) Administered  . Influenza, High Dose Seasonal PF 09/30/2018, 09/10/2019  . Pneumococcal Polysaccharide-23 05/30/2019  . Tdap 07/20/2018    Past Medical History:  Diagnosis Date  . Alcohol abuse   . Colon cancer ([place])   . COPD (chronic obstructive pulmonary disease) ([place])   . Emphysema of lung ([place])   . ESRD (end stage renal disease) ([place])   . Former tobacco use   . Hypertension   . Peripheral edema   . Sepsis ([place]) 10/2016   Aspiration PNA/C diff colitis    Tobacco History: Social History   Tobacco Use  Smoking Status Former Smoker  . Packs/day: 1.00  . Years: 20.00  . Pack years: 20.00  . Types: Cigarettes  . Quit date: 12/20/1981  . Years since quitting: 38.5  Smokeless Tobacco Never Used   Counseling given: Not Answered   Outpatient Medications Prior to Visit  Medication Sig Dispense Refill  . acetaminophen (TYLENOL) 500 MG tablet Take 1,000 mg by mouth every 8 (eight) hours as needed for moderate pain or headache.    Marland Kitchen amiodarone (PACERONE) 200 MG tablet TAKE 1 TABLET(200 MG) BY MOUTH DAILY 90 tablet 0  . apixaban (ELIQUIS) 2.5 MG TABS tablet Take 1 tablet (2.5 mg total) by mouth 2 (two) times daily. 180 tablet 3  . Fluticasone-Umeclidin-Vilant (TRELEGY ELLIPTA) 100-62.5-25 MCG/INH AEPB Inhale 1 puff into the lungs every morning. 1 each 11  . Fluticasone-Umeclidin-Vilant (TRELEGY ELLIPTA) 100-62.5-25 MCG/INH AEPB Inhale 1 puff into the lungs daily.  14 each 0  . multivitamin (RENA-VIT) TABS tablet Take 1 tablet by mouth at bedtime. 90 tablet 0  . sevelamer carbonate (RENVELA) 800 MG tablet Take 1 tablet (800 mg total) by mouth 3 (three) times daily with meals. 270 tablet 0  . allopurinol (ZYLOPRIM) 100 MG tablet Take 100 mg by mouth daily.     No facility-administered medications prior to visit.    Review of Systems  Review of Systems  Constitutional: Negative.   Respiratory: Negative for cough, chest tightness, shortness of breath  and wheezing.   Cardiovascular: Negative.    Physical Exam  BP 112/68 (BP Location: Left Arm, Cuff Size: Normal)   Pulse 95   Temp 97.7 F (36.5 C) (Oral)   Ht 5\' 10"  (1.778 m)   Wt 185 lb (83.9 kg)   SpO2 99%   BMI 26.54 kg/m  Physical Exam Constitutional:      General: He is not in acute distress.    Appearance: Normal appearance. He is not ill-appearing.  HENT:     Head: Normocephalic and atraumatic.     Mouth/Throat:     Mouth: Mucous membranes are moist.     Pharynx: Oropharynx is clear.  Cardiovascular:     Rate and Rhythm: Normal rate and regular rhythm.  Pulmonary:     Effort: Pulmonary effort is normal. No respiratory distress.     Breath sounds: Normal breath sounds. No wheezing, rhonchi or rales.     Comments: CTA, slightly diminished bases. No overt crackles/rales. Musculoskeletal:        General: Normal range of motion.  Skin:    General: Skin is warm and dry.  Neurological:     General: No focal deficit present.     Mental Status: He is alert and oriented to person, place, and time. Mental status is at baseline.  Psychiatric:        Mood and Affect: Mood normal.        Behavior: Behavior normal.        Thought Content: Thought content normal.        Judgment: Judgment normal.      Lab Results:  CBC    Component Value Date/Time   WBC 12.6 (H) 08/20/2019 1429   RBC 3.99 (L) 08/20/2019 1429   HGB 13.6 10/19/2019 0822   HGB 14.1 12/21/2018 1038   HGB 14.9 11/24/2009 0958   HCT 40.0 10/19/2019 0822   HCT 30.1 (L) 06/09/2019 1329   HCT 43.8 11/24/2009 0958   PLT 263 08/20/2019 1429   PLT 276 12/21/2018 1038   MCV 98.0 08/20/2019 1429   MCV 101 (H) 12/21/2018 1038   MCV 108.6 (H) 11/24/2009 0958   MCH 30.1 08/20/2019 1429   MCHC 30.7 08/20/2019 1429   RDW 16.2 (H) 08/20/2019 1429   RDW 12.5 12/21/2018 1038   RDW 15.4 (H) 11/24/2009 0958   LYMPHSABS 1.1 05/31/2019 0346   LYMPHSABS 2.2 11/24/2009 0958   MONOABS 1.4 (H) 05/31/2019 0346    MONOABS 0.6 11/24/2009 0958   EOSABS 0.2 05/31/2019 0346   EOSABS 0.2 11/24/2009 0958   BASOSABS 0.0 05/31/2019 0346   BASOSABS 0.0 11/24/2009 0958    BMET    Component Value Date/Time   NA 135 10/19/2019 0822   NA 139 12/21/2018 1045   K 3.7 10/19/2019 0822   CL 93 (L) 10/19/2019 0822   CO2 25 08/20/2019 1429   GLUCOSE 97 10/19/2019 0822   BUN 32 (H) 10/19/2019 0822   BUN 35 (  H) 12/21/2018 1045   CREATININE 3.30 (H) 10/19/2019 0822   CALCIUM 9.1 08/20/2019 1429   GFRNONAA 10 (L) 08/20/2019 1429   GFRAA 11 (L) 08/20/2019 1429    BNP    Component Value Date/Time   BNP 465.7 (H) 05/19/2019 0901    ProBNP    Component Value Date/Time   PROBNP 443.0 (H) 05/04/2019 1137    Imaging: US Abdomen Limited RUQ  Result Date: 06/03/2020 CLINICAL DATA:  Elevated LFTs EXAM: ULTRASOUND ABDOMEN LIMITED RIGHT UPPER QUADRANT COMPARISON:  None. FINDINGS: Gallbladder: No gallstones or wall thickening visualized. No sonographic Murphy sign noted by sonographer. Small gallbladder polyps are noted. Common bile duct: Diameter: 5.7 mm Liver: Increased echogenicity with areas of nodularity. No definitive discrete mass is seen. Portal vein is patent on color Doppler imaging with normal direction of blood flow towards the liver. Other: Changes of mild to moderate ascites are seen. IMPRESSION: Changes consistent with cirrhosis of the liver with associated ascites. Small gallbladder polyps. Electronically Signed   By: Inez Catalina M.D.   On: 06/03/2020 15:29     Assessment & Plan:   COPD GOLD II spirometry but 02 dep  Patient is doing extremly well. He can no longer afford Trelegy. We will try and step down therapy to LABA/LAMA. Patient will be given sample of Stiolto today to see how he does on this. Benefits investigation showed he is in coverage gap. He will need to apply for patient assistance for any of the LABA/LAMA inhalers if he tolerate step down OR Trelegy/Breztri. If patients breathing  worsens advised him to notify office sooner than follow-up visit in 2 weeks..   Recommendations Stop Trelegy Start Stiolto Respimat 2 puffs once daily in the morning (he will need patient assistance to be started at next visit if he tolerate)  Follow-up 2 weeks with Eustaquio Maize NP    Martyn Ehrich, NP 07/01/2020

## 2020-07-01 NOTE — Patient Instructions (Signed)
Pleasure meeting you today David Barton.  We will do benefits investigation for alternative inhalers to your current trelegy.  Because you are doing well again we are going to try to stepdown your therapy with an LAMA LABA inhaler.  If your breathing worsens please notify office sooner than follow-up visit in 2 weeks..   Recommendations Stop Trelegy Start Stiolto Respimat- take 2 puffs once daily in the morning  Follow-up 2 weeks with Eustaquio Maize NP

## 2020-07-18 ENCOUNTER — Telehealth: Payer: Self-pay | Admitting: Primary Care

## 2020-07-18 NOTE — Telephone Encounter (Signed)
He was doing well at last visit so we stepped his therapy down. Also for cost reasons. If he hasn't noticed a difference that is a good thing, I just wanted to make sure his breathing didn't worsen on Stiolto as opposed to Trelegy. I would continue this- I believe we applied for patient assistance

## 2020-07-18 NOTE — Telephone Encounter (Signed)
Patient called with message from provider. Patient will stay on Stiolto and contact us if his symptoms change.

## 2020-07-18 NOTE — Telephone Encounter (Signed)
Called and spoke with pt who states he cannot tell much of a difference after beginning the Stiolto once switched from Trelegy.  Pt wanted to let Beth know this info and to see if anything else could be recommended.  Beth, please advise.

## 2020-07-28 ENCOUNTER — Other Ambulatory Visit: Payer: Self-pay | Admitting: Cardiovascular Disease

## 2020-07-28 ENCOUNTER — Other Ambulatory Visit: Payer: Self-pay | Admitting: Cardiology

## 2020-07-30 ENCOUNTER — Telehealth: Payer: Self-pay | Admitting: Internal Medicine

## 2020-07-30 MED ORDER — STIOLTO RESPIMAT 2.5-2.5 MCG/ACT IN AERS: 2.0000 | INHALATION_SPRAY | Freq: Every day | RESPIRATORY_TRACT | 5 refills | Status: DC

## 2020-07-30 NOTE — Telephone Encounter (Signed)
Pt returning call.  [phone].  Available the rest of the day.

## 2020-07-30 NOTE — Telephone Encounter (Signed)
Called and spoke with pt who stated that he could not turn the Stiolto inhaler anymore. Asked pt if the inhaler was showing in the red and he stated he was unsure. Pt stated that it was a sample that was given to him and he can no longer turn it. Stated to him that the inhaler was probably empty if he could no longer turn it and he verbalized understanding. Stated to pt that I would send Rx to pharmacy for him and he verbalized understanding. Verified preferred pharmacy and sent Rx for Stiolto to pharmacy for him. Nothing further needed.

## 2020-07-30 NOTE — Telephone Encounter (Signed)
Attempted to call pt but unable to reach. Left message for him to return call. °

## 2020-08-20 ENCOUNTER — Telehealth: Payer: Self-pay | Admitting: Primary Care

## 2020-08-20 MED ORDER — TRELEGY ELLIPTA 100-62.5-25 MCG/INH IN AEPB: 1.0000 | INHALATION_SPRAY | Freq: Every day | RESPIRATORY_TRACT | 5 refills | Status: DC

## 2020-08-20 NOTE — Telephone Encounter (Signed)
He can go back to trelegy. Will need PA started

## 2020-08-20 NOTE — Telephone Encounter (Signed)
Called and spoke with Patient.  Patient stated that he has been on Stiolto for 2-3 weeks, because Trelegy was to expensive. Patient stated he started developing a cough after he started Stiolto, but hoped it would go away.  Patient stated he has a deep cough, with phlegm, that he can't get up. Patient has not other problems. Patient requested to go back to Trelegy, or another inhaler that may be cheaper, that will not cause him to cough.  Message routed to Beth,NP to advise

## 2020-08-20 NOTE — Telephone Encounter (Signed)
Called and spoke with pt letting him know that beth said he could go back on Trelegy and he verbalized understanding. Rx for Trelegy has been sent to preferred pharmacy for pt. Nothing further needed.

## 2020-08-27 IMAGING — DX CHEST  1 VIEW
1 series · 1 of 1 positions shown · non-contrast
Comparison: 06/01/2019

CLINICAL DATA: Pleural effusion. Status post thoracentesis.

EXAM:
CHEST  1 VIEW

[chest ap]
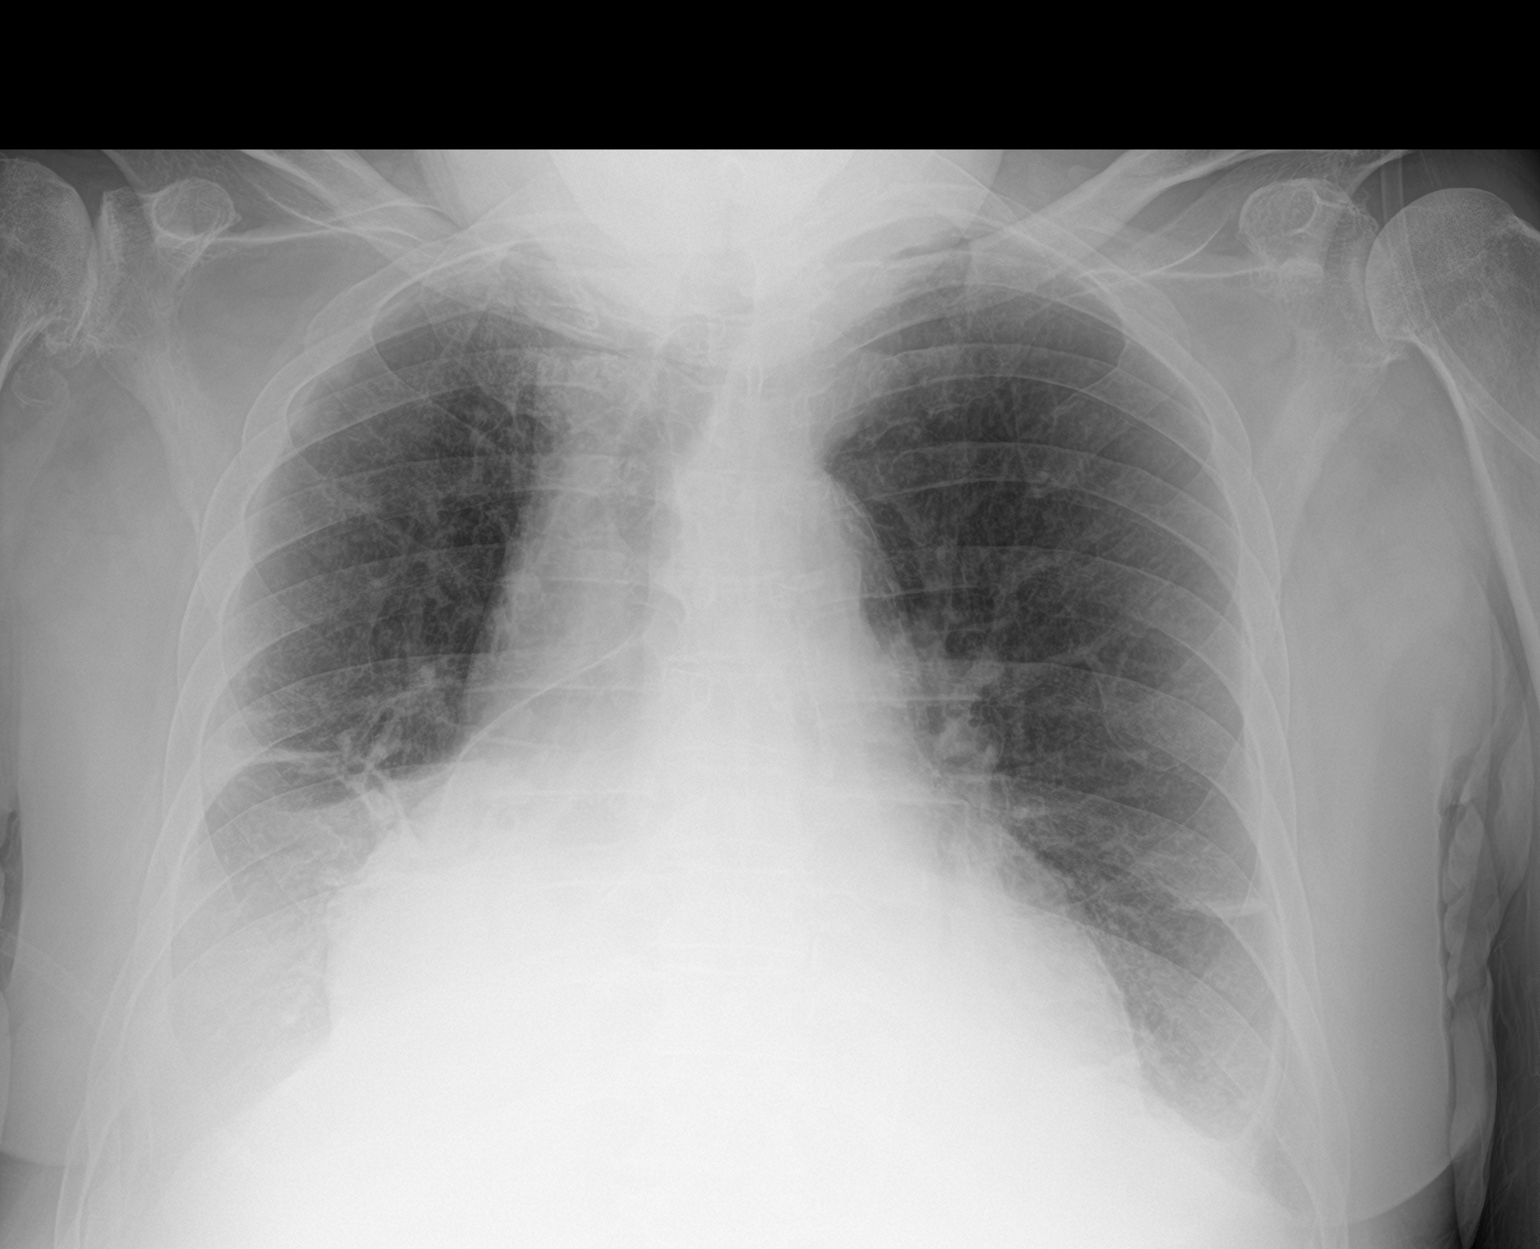

[1 of 1 positions shown; findings below may reference images not displayed]

FINDINGS: Small right pleural effusion shows decrease in size since previous
study. No pneumothorax visualized. Atelectasis or infiltrate is
again seen in both lung bases. Mild scarring is also noted in the
lower lung zones bilaterally. Stable cardiomegaly.
IMPRESSION: 1. Decreased size of small right pleural effusion. No pneumothorax
visualized.
2. Bibasilar atelectasis versus infiltrates, without significant
change.
3. Stable cardiomegaly.

## 2020-08-28 ENCOUNTER — Other Ambulatory Visit: Payer: Self-pay | Admitting: Cardiovascular Disease

## 2020-09-11 ENCOUNTER — Telehealth: Payer: Self-pay | Admitting: *Deleted

## 2020-09-11 ENCOUNTER — Telehealth: Payer: Self-pay | Admitting: Cardiovascular Disease

## 2020-09-11 NOTE — Telephone Encounter (Signed)
    I went in pt's chart to see who called him today. It was to make him an appointment. I made his appt for 10-28-20 with Dr Gwenlyn Found.

## 2020-09-11 NOTE — Telephone Encounter (Signed)
A message was left, re: his follow up visit. 

## 2020-09-15 ENCOUNTER — Telehealth: Payer: Self-pay | Admitting: Internal Medicine

## 2020-09-15 NOTE — Telephone Encounter (Signed)
Called and spoke with patient to offer for him to come in for an appointment since covid test is negative or for him to go to the ED for further evaluation. Patient is now coming in for appt on Wednesday 09/17/20 at 10:15 with Dr. Melvyn Novas. Advised patient to keep an eye on his oxygen levels and if he continues to drop or gets worse for him to go to the ED for evaluation. Nothing further needed at this time.

## 2020-09-15 NOTE — Telephone Encounter (Signed)
Cannot treat low 02 sats over the phone and my notes say he discontinued 02 back in 12/2019 on his own so will need to start the process over again to get him 02 but because there is an acute change in resp status I can't just call in 02  rec ER eval

## 2020-09-15 NOTE — Telephone Encounter (Signed)
Primary Pulmonologist: Dr. Melvyn Novas Last office visit and with whom: 07/01/20 David Barton What do we see them for (pulmonary problems): COPD Last OV assessment/plan: See below  Was appointment offered to patient (explain)?  No   Reason for call: Called and spoke with patient he states a few days he started feeling weak and having a hard time walking. No fever. Patient states oxygen saturation is running 88-94.Yesterday 85-87. He had a covid test yesterday it was Negative. Does not wear oxygen, has dry cough sometimes but when he started Trelegy it went away was on Stiolto and switched back to Trelegy.  Dr. Melvyn Novas please advise  (examples of things to ask: : When did symptoms start? Fever? Cough? Productive? Color to sputum? More sputum than usual? Wheezing? Have you needed increased oxygen? Are you taking your respiratory medications? What over the counter measures have you tried?)  No Known Allergies  Immunization History  Administered Date(s) Administered  . Influenza, High Dose Seasonal PF 09/30/2018, 09/10/2019  . Moderna SARS-COVID-2 Vaccination 01/26/2020, 03/03/2020  . Pneumococcal Polysaccharide-23 05/30/2019  . Tdap 07/20/2018    Assessment & Plan:   COPD GOLD II spirometry but 02 dep  Patient is doing extremly well. He can no longer afford Trelegy. We will try and step down therapy to LABA/LAMA. Patient will be given sample of Stiolto today to see how he does on this. Benefits investigation showed he is in coverage gap. He will need to apply for patient assistance for any of the LABA/LAMA inhalers if he tolerate step down OR Trelegy/Breztri. If patients breathing worsens advised him to notify office sooner than follow-up visit in 2 weeks..   Recommendations Stop Trelegy Start Stiolto Respimat 2 puffs once daily in the morning (he will need patient assistance to be started at next visit if he tolerate)  Follow-up 2 weeks with Eustaquio Maize NP

## 2020-09-17 ENCOUNTER — Other Ambulatory Visit: Payer: Self-pay

## 2020-09-17 ENCOUNTER — Ambulatory Visit (INDEPENDENT_AMBULATORY_CARE_PROVIDER_SITE_OTHER): Payer: Medicare Other | Admitting: Internal Medicine

## 2020-09-17 ENCOUNTER — Ambulatory Visit (INDEPENDENT_AMBULATORY_CARE_PROVIDER_SITE_OTHER): Payer: Medicare Other

## 2020-09-17 ENCOUNTER — Encounter: Payer: Self-pay | Admitting: Internal Medicine

## 2020-09-17 DIAGNOSIS — J90 Pleural effusion, not elsewhere classified: Secondary | ICD-10-CM

## 2020-09-17 DIAGNOSIS — M549 Dorsalgia, unspecified: Secondary | ICD-10-CM

## 2020-09-17 DIAGNOSIS — J449 Chronic obstructive pulmonary disease, unspecified: Secondary | ICD-10-CM

## 2020-09-17 NOTE — Assessment & Plan Note (Signed)
R throaceneteis 05/21/19   =  1.85 l slt bloody exudative, nl glucose, nl cells on path review Repeat 06/02/19 x 1.4 liters Repeat 6/21/0  x 2.3 liters dark red  cyt neg  11/05/2019 resolved as of 11/05/2019 p  HD 11/03/2019  - 09/17/2020 minimal decreased aeration R base s obvious pl effusion   Appears to be well controlled despite PD risking atx and translocation of dialysate.  No change rx needed

## 2020-09-17 NOTE — Patient Instructions (Addendum)
After you use trelegy then brush your teeth with arm and hammer toothpaste/ make slurry and gargle  Please remember to go to the  x-ray department  for your tests - we will call you with the results when they are available     Please schedule a follow up visit in 6 months but call sooner if needed

## 2020-09-17 NOTE — Progress Notes (Signed)
David Barton, male    DOB: [birth year],    MRN: [id number]   Brief patient profile:  86yowm  Quit smoking 1983  At wt 180 with baseline able to walk the dog 10 minutes slow pace but  avoided steps maint on spiriva with GOLD II COPD criteria 07/16/13     History of Present Illness  04/24/2019  Pulmonary/  office eval/[facility] Younan / 02 4lpm 24/7 / persistent afib and started amiodarone since last ov Chief Complaint  Patient presents with  . Pulmonary Consult    Referred by Kerin Ransom, PA. Pt c/o SOB since March 2020. Pt c/o DOE with walking from room to room.  He has had some cough with clear to bloody sputum.     Dyspnea:  Gradually losing ground now Room to room gives out due to sob even on 4lpm  Cough: more bloody/ no pain with cough Sleep: on flat bed, 3 pillows on back SABA QIO:NGEXBM helps the most  rec Stop spiriva and take the  duoneb up to 4 x in 24 hours as you feel you need it if it helps your shortness of breath For cough/congestion >  mucinex or mucinex dm up to 1200 mg every 12 hours and use flutter valve  as much as you can  Humidify 02 as much of the time as your can    06/27/2019  f/u ov/David Barton re:  S/p thoracentesis/ HD initiation on anoro and pulmocort Chief Complaint  Patient presents with  . Follow-up    Breathing is "ok" no new co's.   Dyspnea:  Room to room at home / on 3lpm  Cough: none Sleeping: flat in bed / two pillows  02: 3lpm 24/7  rec Plan A = Automatic = Trelegy one click - take two good drags  Plan B = Backup Only use your albuterol (proair resplick) as a rescue medication Plan C = Crisis - only use your albuterol nebulizer if you first try Plan B and it fails to help > ok to use the nebulizer up to every 4 hours but if start needing it regularly call for immediate appointment Stay on 3lpm at bedtime but ok to adjust during the day to keep your saturations over 90%     08/03/2019  f/u ov/David Barton re: f/u effusions / copd doing better on trelegy  Chief  Complaint  Patient presents with  . Follow-up    CXR repeated. Breathing has improved. He has not been using his albuterol inhaler or neb.   Dyspnea:  Says can walk "1000 yards" ( I believe he means feet)  off 02 but does not check level of  Treadmill x 5 min tired x 1.2 mph no grade at all, also does not check sats of 02  Cough: none  Sleeping: flat in bed, 2 pillows SABA use: none  02: 3lpm bedtime , not at all during the day rec The 02 saturation should at peak exercise once or twice a week with goal of keeping over 90%  Continue 3lpm at bedtime     11/05/2019  f/u ov/David Barton re: copd GOLD II on trelegy/  f/u bilateral effusions/ stopped 02 end of oct 2020 Chief Complaint  Patient presents with  . Follow-up    Breathing is doing well. He c/o occ fatigue.    Dyspnea:  teadmill x 27min RA x  1.2 mph 2 notches  Cough: no cough just some throat clearing, mild hoarseness on trelgy Sleeping: flat p sleeping pill x one  pillow x 2  SABA use: none  02: no longer using any 02 x 3 weeks  - was on 1pm when stopped  rec Have your wife check your oxygen level sleeping - we need to keep it around 90% or higher   07/01/20 NP ov  rec Stop Trelegy Start Stiolto Respimat- take 2 puffs once daily in the morning    09/17/2020  f/u ov/David Barton re:  Back on trelegy p breathing worse on stiolto  Chief Complaint  Patient presents with  . Follow-up  Dyspnea:  Not doing treadmill x one week due positional L back pain and both legs feel weak s numbness or radicular features to pain which radiates up to lower T spine but no pleuritic features, feels better supine with no assoc gi/gu symptoms Cough: no cough / but hoarse  Sleeping: flat in bed / one pillow / PD  SABA use: not using any 02: none    No obvious day to day or daytime variability or assoc excess/ purulent sputum or mucus plugs or hemoptysis or  chest tightness, subjective wheeze or overt sinus or hb symptoms.   Sleeping ok on PD without  nocturnal  or early am exacerbation  of respiratory  c/o's or need for noct saba. Also denies any obvious fluctuation of symptoms with weather or environmental changes or other aggravating or alleviating factors except as outlined above   No unusual exposure hx or h/o childhood pna/ asthma or knowledge of premature birth.  Current Allergies, Complete Past Medical History, Past Surgical History, Family History, and Social History were reviewed in [employer] record.  ROS  The following are not active complaints unless bolded Hoarseness, sore throat, dysphagia, dental problems, itching, sneezing,  nasal congestion or discharge of excess mucus or purulent secretions, ear ache,   fever, chills, sweats, unintended wt loss or wt gain, classically pleuritic or exertional cp,  orthopnea pnd or arm/hand swelling  or leg swelling, presyncope, palpitations, abdominal pain, anorexia, nausea, vomiting, diarrhea  or change in bowel habits or change in bladder habits, change in stools or change in urine, dysuria, hematuria,  rash, arthralgias, visual complaints, headache, numbness, weakness or ataxia or problems with walking or coordination,  change in mood or  memory.        Current Meds  Medication Sig  . acetaminophen (TYLENOL) 500 MG tablet Take 1,000 mg by mouth every 8 (eight) hours as needed for moderate pain or headache.  Marland Kitchen amiodarone (PACERONE) 200 MG tablet TAKE 1 TABLET(200 MG) BY MOUTH DAILY  . ELIQUIS 2.5 MG TABS tablet TAKE 1 TABLET(2.5 MG) BY MOUTH TWICE DAILY  . Fluticasone-Umeclidin-Vilant (TRELEGY ELLIPTA) 100-62.5-25 MCG/INH AEPB Inhale 1 puff into the lungs daily.  . multivitamin (RENA-VIT) TABS tablet Take 1 tablet by mouth at bedtime.  . sevelamer carbonate (RENVELA) 800 MG tablet Take 1 tablet (800 mg total) by mouth 3 (three) times daily with meals.                      Objective:     09/17/2020        184  11/05/2019      190   08/03/2019       195    06/27/2019         208   04/24/19 231 lb (104.8 kg)  04/17/19 225 lb (102.1 kg)  03/26/19 226 lb 3.1 oz (102.6 kg)    Vital signs reviewed  09/17/2020  - Note at  rest 02 sats  96% on RA     Hoarse amb wm nl gait  HEENT : pt wearing mask not removed for exam due to covid - 19 concerns.    NECK :  without JVD/Nodes/TM/ nl carotid upstrokes bilaterally   LUNGS: no acc muscle use,  Mild barrel  contour chest wall with bilateral  Distant bs s audible wheeze and  without cough on insp or exp maneuvers  and mild  Hyperresonant  to  percussion bilaterally     CV:  RRR  no s3 or murmur or increase in P2, and no edema   ABD: obese  soft and nontender with pos end  insp Hoover's  in the supine position. No bruits or organomegaly appreciated, bowel sounds nl  MS:     ext warm without deformities, calf tenderness, cyanosis or clubbing No obvious joint restrictions - mild tendereness over upper L lumbar spine/ no rash or bruising   SKIN: warm and dry without lesions    NEURO:  alert, approp, nl sensorium with  no motor or cerebellar deficits apparent.              CXR PA and Lateral:   09/17/2020 :    I personally reviewed images and agree with radiology impression as follows:    slt decreased aeration R base but no obvious L sided findings including viz T spine          Assessment

## 2020-09-17 NOTE — Assessment & Plan Note (Signed)
New onset around 09/11/20 > defer w/u to PCP 09/17/2020   Positional, better supine s evidence of pleuritic by hx or pulmonary component by cxr  and plans to see PCP this afternoon for further evaluation          Each maintenance medication was reviewed in detail including emphasizing most importantly the difference between maintenance and prns and under what circumstances the prns are to be triggered using an action plan format where appropriate.  Total time for H and P, chart review, counseling, teaching device and generating customized AVS unique to this  acute office visit / charting =  35 min

## 2020-09-17 NOTE — Assessment & Plan Note (Signed)
Quit smoking 1983  - PFTs 07/16/2013  FEV1  1.75 (61%) ratio 52 and 15% better p B2, DLCO 74% - 06/27/2019  After extensive coaching inhaler device,  effectiveness =    90% with dpi so changed to trelegy maint and proair respiclick> improved as of ov 08/03/2019    Group D in terms of symptom/risk and laba/lama/ICS  therefore appropriate rx at this point >>>  trelegy preferred at this point though causing dysphonia > rec baking soda tootpaste slurry p daily use of trelegy

## 2020-09-19 NOTE — Progress Notes (Signed)
Spoke with pt and notified of results per Dr. Wert. Pt verbalized understanding and denied any questions. 

## 2020-10-17 ENCOUNTER — Encounter: Payer: Self-pay | Admitting: Pulmonary Disease

## 2020-10-17 ENCOUNTER — Other Ambulatory Visit: Payer: Self-pay

## 2020-10-17 ENCOUNTER — Ambulatory Visit: Payer: Medicare Other | Admitting: Pulmonary Disease

## 2020-10-17 ENCOUNTER — Ambulatory Visit (INDEPENDENT_AMBULATORY_CARE_PROVIDER_SITE_OTHER): Payer: Medicare Other

## 2020-10-17 ENCOUNTER — Ambulatory Visit: Payer: Medicare Other | Admitting: Internal Medicine

## 2020-10-17 VITALS — BP 120/70 | HR 88 | Temp 97.4°F | Ht 69.0 in | Wt 185.8 lb

## 2020-10-17 DIAGNOSIS — J449 Chronic obstructive pulmonary disease, unspecified: Secondary | ICD-10-CM | POA: Diagnosis not present

## 2020-10-17 DIAGNOSIS — Z79899 Other long term (current) drug therapy: Secondary | ICD-10-CM | POA: Diagnosis not present

## 2020-10-17 DIAGNOSIS — Z0000 Encounter for general adult medical examination without abnormal findings: Secondary | ICD-10-CM | POA: Insufficient documentation

## 2020-10-17 MED ORDER — TRELEGY ELLIPTA 100-62.5-25 MCG/INH IN AEPB: 1.0000 | INHALATION_SPRAY | Freq: Every day | RESPIRATORY_TRACT | 0 refills | Status: DC

## 2020-10-17 NOTE — Assessment & Plan Note (Signed)
Plan: Trelegy Ellipta samples provided today Patient needs [employer] for you paperwork

## 2020-10-17 NOTE — Assessment & Plan Note (Signed)
Plan: Continue Trelegy Ellipta Continue rescue Hailer Follow-up in 3 months Up-to-date with vaccinations Trelegy Ellipta samples provided today [place] for you paperwork provided today

## 2020-10-17 NOTE — Patient Instructions (Addendum)
You were seen today by Lauraine Rinne, NP  for:   1. COPD GOLD II   - DG Chest 2 View; Future  Trelegy Ellipta  >>> 1 puff daily in the morning >>>rinse mouth out after use  >>> This inhaler contains 3 medications that help manage her respiratory status, contact our office if you cannot afford this medication or unable to remain on this medication  Only use your albuterol as a rescue medication to be used if you can't catch your breath by resting or doing a relaxed purse lip breathing pattern.  - The less you use it, the better it will work when you need it. - Ok to use up to 2 puffs  every 4 hours if you must but call for immediate appointment if use goes up over your usual need - Don't leave home without it !!  (think of it like the spare tire for your car)   Note your daily symptoms > remember "red flags" for COPD:   >>>Increase in cough >>>increase in sputum production >>>increase in shortness of breath or activity  intolerance.   If you notice these symptoms, please call the office to be seen.    2. Medication management  Samples of Trelegy Ellipta provided today  Patient needs [employer] for you paperwork to see if he may be able to apply for patient assistance  Patient can also discuss with primary care whether or not he can apply for patient assistance for his Eliquis  3. Healthcare maintenance  Up-to-date with COVID-19 vaccinations  Up-to-date with seasonal flu vaccine   We recommend today:  Orders Placed This Encounter  Procedures  . DG Chest 2 View    Standing Status:   Future    Number of Occurrences:   1    Standing Expiration Date:   10/17/2021    Order Specific Question:   Reason for Exam (SYMPTOM  OR DIAGNOSIS REQUIRED)    Answer:   COPD    Order Specific Question:   Preferred imaging location?    Answer:   Internal   Orders Placed This Encounter  Procedures  . DG Chest 2 View   No orders of the defined types were placed in this encounter.   Follow Up:     Return in about 3 months (around 01/17/2021), or if symptoms worsen or fail to improve, for Follow up with Dr. Melvyn Novas.   Notification of test results are managed in the following manner: If there are  any recommendations or changes to the  plan of care discussed in office today,  we will contact you and let you know what they are. If you do not hear from [place], then your results are normal and you can view them through your  MyChart account , or a letter will be sent to you. Thank you again for trusting [place] with your care  - Thank you, [facility]    It is flu season:   >>> Best ways to protect herself from the flu: Receive the yearly flu vaccine, practice good hand hygiene washing with soap and also using hand sanitizer when available, eat a nutritious meals, get adequate rest, hydrate appropriately       Please contact the office if your symptoms worsen or you have concerns that you are not improving.   Thank you for choosing [facility] for your healthcare, and for allowing [place] to partner with you on your healthcare journey. I am thankful to be able to  provide care to you today.   Wyn Quaker FNP-C

## 2020-10-17 NOTE — Assessment & Plan Note (Signed)
Up-to-date with vaccinations °

## 2020-10-17 NOTE — Progress Notes (Signed)
@Patient  ID: David Barton, male    DOB: 07/20/1933, 86 y.o.   MRN: [id number]  Chief Complaint  Patient presents with  . Follow-up    COPD, Bil PE, doing better    Referring provider: Chesley Noon, MD  HPI:  86 year old male former smoker followed in our office for COPD, chronic pleural effusions  PMH: Hypertension, acute kidney injury, dysphagia, history of thoracentesis, end-stage renal disease Smoker/ Smoking History: Former smoker.  Quit 1983.  20-pack-year smoking history. Maintenance: Trelegy Ellipta 100 Pt of: Dr. Melvyn Novas  10/17/2020  - Visit   86 year old male former smoker followed in our office for COPD and chronic respiratory failure.  Established with Dr. Melvyn Novas.  Last seen in September/2021.  Back to our office today as a follow-up.  He has had multiple visits with his primary care doctor Dr. Melford Aase.  He was treated with doxycycline in late September/2021 with prednisone.  Patient reports that he is feeling significantly better since then.  He is up-to-date with his vaccinations.  Patient had a chest x-ray today that showed normal heart size.  No pulmonary infiltrate or pleural effusion.  Stable evaluation.  He is currently using his Trelegy Ellipta 100 without any issues.  Questionaires / Pulmonary Flowsheets:   ACT:  No flowsheet data found.  MMRC: mMRC Dyspnea Scale mMRC Score  10/17/2020 1  09/17/2020 3  07/01/2020 0    Epworth:  No flowsheet data found.  Tests:   11/05/2019-chest x-ray-interval clearing of pleural effusions, stable chronic interstitial coarsening,  09/18/2020-chest x-ray-progression of bibasilar atelectasis/infiltrate, minimal pleural effusion  05/21/2019-echocardiogram-LV ejection fraction 45 to 50%  FENO:  No results found for: NITRICOXIDE  PFT: No flowsheet data found.  WALK:  SIX MIN WALK 09/17/2020  Tech Comments: Pt stopped to rest twice at slow pace. States he can't tolerate a 3rd lap. Reports breathing better after  3 minute rest after completion of lap 2    Imaging: DG Chest 2 View  Result Date: 10/17/2020 CLINICAL DATA:  COPD, colon cancer, end-stage renal disease, hypertension, former smoker EXAM: CHEST - 2 VIEW COMPARISON:  09/17/2020 FINDINGS: Normal heart size, mediastinal contours, and pulmonary vascularity. Emphysematous and bronchitic changes consistent with COPD. Bibasilar scarring. No pulmonary infiltrate, pleural effusion, or pneumothorax. No acute osseous findings. IMPRESSION: COPD changes with bibasilar scarring. No acute abnormalities. Electronically Signed   By: Lavonia Dana M.D.   On: 10/17/2020 15:46    Lab Results:  CBC    Component Value Date/Time   WBC 12.6 (H) 08/20/2019 1429   RBC 3.99 (L) 08/20/2019 1429   HGB 13.6 10/19/2019 0822   HGB 14.1 12/21/2018 1038   HGB 14.9 11/24/2009 0958   HCT 40.0 10/19/2019 0822   HCT 30.1 (L) 06/09/2019 1329   HCT 43.8 11/24/2009 0958   PLT 263 08/20/2019 1429   PLT 276 12/21/2018 1038   MCV 98.0 08/20/2019 1429   MCV 101 (H) 12/21/2018 1038   MCV 108.6 (H) 11/24/2009 0958   MCH 30.1 08/20/2019 1429   MCHC 30.7 08/20/2019 1429   RDW 16.2 (H) 08/20/2019 1429   RDW 12.5 12/21/2018 1038   RDW 15.4 (H) 11/24/2009 0958   LYMPHSABS 1.1 05/31/2019 0346   LYMPHSABS 2.2 11/24/2009 0958   MONOABS 1.4 (H) 05/31/2019 0346   MONOABS 0.6 11/24/2009 0958   EOSABS 0.2 05/31/2019 0346   EOSABS 0.2 11/24/2009 0958   BASOSABS 0.0 05/31/2019 0346   BASOSABS 0.0 11/24/2009 0958    BMET    Component Value  Date/Time   NA 135 10/19/2019 0822   NA 139 12/21/2018 1045   K 3.7 10/19/2019 0822   CL 93 (L) 10/19/2019 0822   CO2 25 08/20/2019 1429   GLUCOSE 97 10/19/2019 0822   BUN 32 (H) 10/19/2019 0822   BUN 35 (H) 12/21/2018 1045   CREATININE 3.30 (H) 10/19/2019 0822   CALCIUM 9.1 08/20/2019 1429   GFRNONAA 10 (L) 08/20/2019 1429   GFRAA 11 (L) 08/20/2019 1429    BNP    Component Value Date/Time   BNP 465.7 (H) 05/19/2019 0901     ProBNP    Component Value Date/Time   PROBNP 443.0 (H) 05/04/2019 1137    Specialty Problems      Pulmonary Problems   Acute respiratory failure ([name])   Hypoxia   COPD with acute exacerbation ([name])    Followed in Pulmonary clinic/ [facility]/ [place]  Quit smoking 1983  - PFTs 07/16/2013  FEV1  1.75 (61%) ratio 52 and 15% better p B2, DLCO 74%      Aspiration pneumonia ([name])   Sleep apnea   Pulmonary edema   Chronic respiratory failure with hypoxia ([name])    Onset 02/2019 secondary to cap, chf/ copd      DOE (dyspnea on exertion)   Chronic respiratory failure with hypoxia and hypercapnia ([name])    Onset 02/2019 secondary to cap, chf/ copd HC03 05/04/2019 = 32  HCO3  06/14/19   = 25  - Pt d/c'd 02 on his own end of Oct 2020 and again 12/26/2019       Acute on chronic respiratory failure ([name])   COPD GOLD II     Quit smoking 1983  - PFTs 07/16/2013  FEV1  1.75 (61%) ratio 52 and 15% better p B2, DLCO 74% - 06/27/2019  After extensive coaching inhaler device,  effectiveness =    90% with dpi so changed to trelegy maint and proair respiclick> improved as of ov 08/03/2019       SOB (shortness of breath)   Lung infiltrate   Shortness of breath   Chronic bilateral pleural effusions  R>L    R throaceneteis 05/21/19   =  1.85 l slt bloody exudative, nl glucose, nl cells on path review Repeat 06/02/19 x 1.4 liters Repeat 6/21/0  x 2.3 liters dark red  cyt neg  11/05/2019 resolved as of 11/05/2019 p  HD 11/03/2019  - 09/17/2020 minimal decreased aeration R base s obvious pl effusion          Allergies  Allergen Reactions  . Amlodipine Swelling    Immunization History  Administered Date(s) Administered  . Influenza, High Dose Seasonal PF 09/30/2018, 09/10/2019  . Moderna SARS-COVID-2 Vaccination 01/26/2020, 03/03/2020, 10/15/2020  . Pneumococcal Polysaccharide-23 05/30/2019  . Tdap 07/20/2018    Past Medical History:  Diagnosis Date  . Alcohol abuse   . Colon cancer  ([place])   . COPD (chronic obstructive pulmonary disease) ([place])   . Emphysema of lung ([place])   . ESRD (end stage renal disease) ([place])   . Former tobacco use   . Hypertension   . Peripheral edema   . Sepsis ([place]) 10/2016   Aspiration PNA/C diff colitis    Tobacco History: Social History   Tobacco Use  Smoking Status Former Smoker  . Packs/day: 1.00  . Years: 20.00  . Pack years: 20.00  . Types: Cigarettes  . Quit date: 12/20/1981  . Years since quitting: 38.8  Smokeless Tobacco Never Used   Counseling given:  Not Answered   Continue to not smoke  Outpatient Encounter Medications as of 10/17/2020  Medication Sig  . allopurinol (ZYLOPRIM) 100 MG tablet Take by mouth.  Marland Kitchen amiodarone (PACERONE) 200 MG tablet TAKE 1 TABLET(200 MG) BY MOUTH DAILY  . calcitRIOL (ROCALTROL) 0.5 MCG capsule Take by mouth.  . ELIQUIS 2.5 MG TABS tablet TAKE 1 TABLET(2.5 MG) BY MOUTH TWICE DAILY  . Fluticasone-Umeclidin-Vilant (TRELEGY ELLIPTA) 100-62.5-25 MCG/INH AEPB Inhale 1 puff into the lungs daily.  . multivitamin (RENA-VIT) TABS tablet Take 1 tablet by mouth at bedtime.  . sevelamer carbonate (RENVELA) 800 MG tablet Take 1 tablet (800 mg total) by mouth 3 (three) times daily with meals.  Marland Kitchen zolpidem (AMBIEN) 10 MG tablet Take by mouth.  . [DISCONTINUED] acetaminophen (TYLENOL) 500 MG tablet Take 1,000 mg by mouth every 8 (eight) hours as needed for moderate pain or headache.   No facility-administered encounter medications on file as of 10/17/2020.     Review of Systems  Review of Systems  Constitutional: Positive for fatigue. Negative for activity change, chills, fever and unexpected weight change.  HENT: Negative for postnasal drip, rhinorrhea, sinus pressure, sinus pain and sore throat.   Eyes: Negative.   Respiratory: Positive for shortness of breath. Negative for cough and wheezing.   Cardiovascular: Negative for chest pain and palpitations.  Gastrointestinal: Negative for constipation,  diarrhea, nausea and vomiting.  Endocrine: Negative.   Genitourinary: Negative.   Musculoskeletal: Negative.   Skin: Negative.   Neurological: Negative for dizziness and headaches.  Psychiatric/Behavioral: Negative.  Negative for dysphoric mood. The patient is not nervous/anxious.   All other systems reviewed and are negative.    Physical Exam  BP 120/70 (BP Location: Left Arm, Cuff Size: Normal)   Pulse 88   Temp (!) 97.4 F (36.3 C) (Oral)   Ht 5\' 9"  (1.753 m)   Wt 185 lb 12.8 oz (84.3 kg)   SpO2 97%   BMI 27.44 kg/m   Wt Readings from Last 5 Encounters:  10/17/20 185 lb 12.8 oz (84.3 kg)  09/17/20 184 lb (83.5 kg)  07/01/20 185 lb (83.9 kg)  04/09/20 183 lb (83 kg)  11/05/19 190 lb (86.2 kg)    BMI Readings from Last 5 Encounters:  10/17/20 27.44 kg/m  09/17/20 27.17 kg/m  07/01/20 26.54 kg/m  04/09/20 26.26 kg/m  11/05/19 27.26 kg/m     Physical Exam Vitals and nursing note reviewed.  Constitutional:      General: He is not in acute distress.    Appearance: Normal appearance. He is obese.  HENT:     Head: Normocephalic and atraumatic.     Right Ear: Hearing, tympanic membrane, ear canal and external ear normal. There is no impacted cerumen.     Left Ear: Hearing, tympanic membrane, ear canal and external ear normal. There is no impacted cerumen.     Ears:     Comments: Cochlear implant    Nose: Nose normal. No mucosal edema or rhinorrhea.     Right Turbinates: Not enlarged.     Left Turbinates: Not enlarged.     Mouth/Throat:     Mouth: Mucous membranes are dry.     Pharynx: Oropharynx is clear. No oropharyngeal exudate.  Eyes:     Pupils: Pupils are equal, round, and reactive to light.  Cardiovascular:     Rate and Rhythm: Normal rate and regular rhythm.     Pulses: Normal pulses.     Heart sounds: Normal heart sounds. No murmur  heard.   Pulmonary:     Effort: Pulmonary effort is normal.     Breath sounds: Normal breath sounds. No decreased  breath sounds, wheezing or rales.  Musculoskeletal:     Cervical back: Normal range of motion.     Right lower leg: No edema.     Left lower leg: No edema.  Lymphadenopathy:     Cervical: No cervical adenopathy.  Skin:    General: Skin is warm and dry.     Capillary Refill: Capillary refill takes less than 2 seconds.     Findings: No erythema or rash.  Neurological:     General: No focal deficit present.     Mental Status: He is alert and oriented to person, place, and time.     Motor: No weakness.     Coordination: Coordination normal.     Gait: Gait is intact. Gait normal.  Psychiatric:        Mood and Affect: Mood normal.        Behavior: Behavior normal. Behavior is cooperative.        Thought Content: Thought content normal.        Judgment: Judgment normal.       Assessment & Plan:   COPD GOLD II  Plan: Continue Trelegy Ellipta Continue rescue Hailer Follow-up in 3 months Up-to-date with vaccinations Trelegy Ellipta samples provided today [place] for you paperwork provided today   Healthcare maintenance Up-to-date with vaccinations  Medication management Plan: Trelegy Ellipta samples provided today Patient needs [employer] for you paperwork    Return in about 3 months (around 01/17/2021), or if symptoms worsen or fail to improve, for Follow up with Dr. Melvyn Novas.   Lauraine Rinne, NP 10/17/2020   This appointment required 26 minutes of patient care (this includes precharting, chart review, review of results, face-to-face care, etc.).

## 2020-10-17 NOTE — Addendum Note (Signed)
Addended by: Coralie Keens on: 10/17/2020 04:59 PM   Modules accepted: Orders

## 2020-10-28 ENCOUNTER — Ambulatory Visit (INDEPENDENT_AMBULATORY_CARE_PROVIDER_SITE_OTHER): Payer: Medicare Other | Admitting: Cardiovascular Disease

## 2020-10-28 ENCOUNTER — Other Ambulatory Visit: Payer: Self-pay

## 2020-10-28 ENCOUNTER — Encounter: Payer: Self-pay | Admitting: Cardiovascular Disease

## 2020-10-28 VITALS — BP 116/56 | HR 96 | Ht 70.0 in | Wt 183.0 lb

## 2020-10-28 DIAGNOSIS — I480 Paroxysmal atrial fibrillation: Secondary | ICD-10-CM

## 2020-10-28 DIAGNOSIS — I429 Cardiomyopathy, unspecified: Secondary | ICD-10-CM | POA: Diagnosis not present

## 2020-10-28 DIAGNOSIS — I10 Essential (primary) hypertension: Secondary | ICD-10-CM | POA: Diagnosis not present

## 2020-10-28 MED ORDER — METOPROLOL SUCCINATE ER 25 MG PO TB24: 25.0000 mg | ORAL_TABLET | Freq: Every day | ORAL | 3 refills | Status: DC

## 2020-10-28 NOTE — Assessment & Plan Note (Signed)
History of persistent A. fib on Eliquis oral anticoagulation.  He remains on amiodarone for unclear reasons with a heart rate in the 90s.  We will stop his amiodarone and begin him on low-dose Toprol for rate control.

## 2020-10-28 NOTE — Patient Instructions (Signed)
Medication Instructions:  Stop taking amiodarone. Start taking metoprolol 25mg  once daily.  *If you need a refill on your cardiac medications before your next appointment, please call your pharmacy*   Follow-Up: At [facility], you and your health needs are our priority.  As part of our continuing mission to provide you with exceptional heart care, we have created designated Provider Care Teams.  These Care Teams include your primary Cardiologist (physician) and Advanced Practice Providers (APPs -  Physician Assistants and Nurse Practitioners) who all work together to provide you with the care you need, when you need it.  We recommend signing up for the patient portal called "MyChart".  Sign up information is provided on this After Visit Summary.  MyChart is used to connect with patients for Virtual Visits (Telemedicine).  Patients are able to view lab/test results, encounter notes, upcoming appointments, etc.  Non-urgent messages can be sent to your provider as well.   To learn more about what you can do with MyChart, go to [email or web address].    Your next appointment:   4 week(s)  The format for your next appointment:   In Person  Provider:   You will see one of the following Advanced Practice Providers on your designated Care Team:    Kerin Ransom, PA-C  [place], [place]  Coletta Memos, [place]  Then, Quay Burow, MD will plan to see you again in 12 month(s).

## 2020-10-28 NOTE — Progress Notes (Signed)
10/28/2020 David Barton   December 16, 1933  [id number]  Primary Physician Chesley Noon, MD Primary Cardiologist: Lorretta Harp MD FACP, [place], [place], [place]  HPI:  David Barton is a 86 y.o.    mild to moderately overweight married Caucasian male father of 2, grandfather one grandchild who I initially saw during a hospitalization in November 2017 during episode of sepsis.I last saw him in the office  10/12/2019. He has a history of remote tobacco abuse. He underwent [employer] which he retired from an age 38. He has lost 50 pounds intentionally and feels much better. There is a history of colon cancer. When I saw him in November he was staring episode of sepsis and mildly elevated troponins. Also had some PSVT. His EF at that time was 30-35% which is ultimately improved this 55-60% by 2-D echo done recently. He is completely asymptomatic. Since I saw him March 2018 he has gained 40 pounds. He does admit to dietary indiscretion. He has had increasing dyspnea on exertion over the last several weeks which improved with the ministration of low-dose oral diuretics. Edema improved as well. He says that he watches his salt. He denies chest pain. Since I saw him in the office back in January of this year he has had a fairly eventful year from a medical point of view.  He was placed on dialysis in June and since that time he feels clinically improved.  He has had acute on chronic systolic and diastolic heart failure, thoracentesis and TEE guided cardioversion in March of this year.  He was seen by Kerin Ransom in July which time he was thought to be in sinus rhythm on amiodarone and Eliquis oral anticoagulation.  He currently exercises daily walking 12 minutes a day.  He denies chest pain or shortness of breath.  Since I saw him a year ago he continues to do well.  He is doing peritoneal dialysis at home.  He is beginning to start back exercising.  He is asymptomatic.   Current Meds    Medication Sig  . ELIQUIS 2.5 MG TABS tablet TAKE 1 TABLET(2.5 MG) BY MOUTH TWICE DAILY  . Fluticasone-Umeclidin-Vilant (TRELEGY ELLIPTA) 100-62.5-25 MCG/INH AEPB Inhale 1 puff into the lungs daily.  . Fluticasone-Umeclidin-Vilant (TRELEGY ELLIPTA) 100-62.5-25 MCG/INH AEPB Inhale 1 puff into the lungs daily.  . multivitamin (RENA-VIT) TABS tablet Take 1 tablet by mouth at bedtime.  . sevelamer carbonate (RENVELA) 800 MG tablet Take 1 tablet (800 mg total) by mouth 3 (three) times daily with meals.  . [DISCONTINUED] allopurinol (ZYLOPRIM) 100 MG tablet Take 100 mg by mouth.   . [DISCONTINUED] amiodarone (PACERONE) 200 MG tablet TAKE 1 TABLET(200 MG) BY MOUTH DAILY  . [DISCONTINUED] calcitRIOL (ROCALTROL) 0.5 MCG capsule Take by mouth.  . [DISCONTINUED] zolpidem (AMBIEN) 10 MG tablet Take by mouth.     Allergies  Allergen Reactions  . Amlodipine Swelling    Social History   Socioeconomic History  . Marital status: Married    Spouse name: Not on file  . Number of children: 3  . Years of education: Not on file  . Highest education level: Not on file  Occupational History  . Occupation: Retired- Associate Professor  Tobacco Use  . Smoking status: Former Smoker    Packs/day: 1.00    Years: 20.00    Pack years: 20.00    Types: Cigarettes    Quit date: 12/20/1981    Years since quitting: 38.8  .  Smokeless tobacco: Never Used  Vaping Use  . Vaping Use: Never used  Substance and Sexual Activity  . Alcohol use: Yes    Alcohol/week: 7.0 standard drinks    Types: 7 Standard drinks or equivalent per week    Comment: states he drinks 3-4 days a week, "a couple" on those occasions but does not further quaniity.  . Drug use: No  . Sexual activity: Never  Other Topics Concern  . Not on file  Social History Narrative  . Not on file   Social Determinants of Health   Financial Resource Strain:   . Difficulty of Paying Living Expenses: Not on file  Food Insecurity:   . Worried  About Charity fundraiser in the Last Year: Not on file  . Ran Out of Food in the Last Year: Not on file  Transportation Needs:   . Lack of Transportation (Medical): Not on file  . Lack of Transportation (Non-Medical): Not on file  Physical Activity:   . Days of Exercise per Week: Not on file  . Minutes of Exercise per Session: Not on file  Stress:   . Feeling of Stress : Not on file  Social Connections:   . Frequency of Communication with Friends and Family: Not on file  . Frequency of Social Gatherings with Friends and Family: Not on file  . Attends Religious Services: Not on file  . Active Member of Clubs or Organizations: Not on file  . Attends Archivist Meetings: Not on file  . Marital Status: Not on file  Intimate Partner Violence:   . Fear of Current or Ex-Partner: Not on file  . Emotionally Abused: Not on file  . Physically Abused: Not on file  . Sexually Abused: Not on file     Review of Systems: General: negative for chills, fever, night sweats or weight changes.  Cardiovascular: negative for chest pain, dyspnea on exertion, edema, orthopnea, palpitations, paroxysmal nocturnal dyspnea or shortness of breath Dermatological: negative for rash Respiratory: negative for cough or wheezing Urologic: negative for hematuria Abdominal: negative for nausea, vomiting, diarrhea, bright red blood per rectum, melena, or hematemesis Neurologic: negative for visual changes, syncope, or dizziness All other systems reviewed and are otherwise negative except as noted above.    Blood pressure (!) 116/56, pulse 96, height 5\' 10"  (1.778 m), weight 183 lb (83 kg).  General appearance: alert and no distress Neck: no adenopathy, no carotid bruit, no JVD, supple, symmetrical, trachea midline and thyroid not enlarged, symmetric, no tenderness/mass/nodules Lungs: clear to auscultation bilaterally Heart: irregularly irregular rhythm Extremities: extremities normal, atraumatic, no  cyanosis or edema Pulses: 2+ and symmetric Skin: Skin color, texture, turgor normal. No rashes or lesions Neurologic: Alert and oriented X 3, normal strength and tone. Normal symmetric reflexes. Normal coordination and gait  EKG atrial fibrillation with a ventricular sponsor 96 and right bundle branch block.  I personally reviewed this EKG.  ASSESSMENT AND PLAN:   Essential hypertension History of essential hypertension a blood pressure measured today 116/56.  He is not on antihypertensive medications.    Cardiomyopathy ([place]) History of nonischemic cardiomyopathy with an EF in the 45 to 50% range by his most recent 2D echo performed 05/21/2019.  PAF (paroxysmal atrial fibrillation) ([name]) History of persistent A. fib on Eliquis oral anticoagulation.  He remains on amiodarone for unclear reasons with a heart rate in the 90s.  We will stop his amiodarone and begin him on low-dose Toprol for rate control.  Lorretta Harp MD FACP,FACC,FAHA, [facility] 10/28/2020 3:57 PM

## 2020-10-28 NOTE — Assessment & Plan Note (Signed)
History of essential hypertension a blood pressure measured today 116/56.  He is not on antihypertensive medications.

## 2020-10-28 NOTE — Assessment & Plan Note (Signed)
History of nonischemic cardiomyopathy with an EF in the 45 to 50% range by his most recent 2D echo performed 05/21/2019.

## 2020-11-01 ENCOUNTER — Telehealth: Payer: Self-pay | Admitting: Physician Assistant

## 2020-11-01 NOTE — Telephone Encounter (Signed)
86 year old male with end-stage renal disease on home peritoneal dialysis, atrial fibrillation, dilated cardiomyopathy. He was just seen by Dr. Gwenlyn Found 10/28/2020. He remained in persistent atrial fibrillation. Therefore, amiodarone was stopped. He was placed on metoprolol succinate 25 mg daily. The patient called into the answering service due to concerns over low blood pressure. He is somewhat symptomatic with fatigue. He has not had near syncope, syncope, chest pain, shortness of breath. He notes his systolic pressures in the low 100s.  PLAN: I advised him to decrease his metoprolol succinate to 12.5 mg every night. I advised him to contact us if he his blood pressure continues to run low or if he remains symptomatic. Of note, I did reassure him that fatigue may be related to beta-blocker therapy and this will likely resolve after 1 to 2 weeks. Richardson Dopp, PA-C 11/01/2020 2:28 PM

## 2020-11-03 ENCOUNTER — Telehealth: Payer: Self-pay | Admitting: Cardiology

## 2020-11-03 NOTE — Telephone Encounter (Signed)
Returned call to pt he states that he called on-call 11-13 and was told by [facility] and he told him to cut the metoprolol and take it a HS. Pt was just wondering "who this guy is and if this is ok". His BP today 110/59. Informed pt that Nicki Reaper is a PA here and should continue taking 12.5mg  daily. He will continue to take BP daily and call if it is abnormal.

## 2020-11-03 NOTE — Telephone Encounter (Signed)
Patient is calling and want to speak with Dr. Gwenlyn Found or his nurse about the blood pressure medication he's taking. Please call

## 2020-11-03 NOTE — Telephone Encounter (Signed)
Patient following up on advice given to him by Richardson Dopp on 11/13 in regards to his blood pressure. Patient wanted to be sure that Dr. Gwenlyn Found was okay with the plan. Patient wanted to know if it's possible to go back on the medicine he was on previously - he states that his blood pressure is still running low but did not provide a bp reading.   Please call/advise. Thank you!

## 2020-11-04 NOTE — Telephone Encounter (Signed)
That’s fine with me

## 2020-11-05 NOTE — Telephone Encounter (Signed)
Patient began taking Metoprolol 25 mg approximately a week ago. He was concerned when his systolic BP dropped into the 90's. Patient called our office and was instructed by Kathleen Argue, PA to only take half tab of Metoprolol in the evening making the new dose 12.5 mg per day.  Patient calling in to report that he still feels his BP is too low. His BP this morning was 112/59 with HR of 72. Pt reports being asymptomatic, denies lightheadedness, dizziness, palpations, SOB or swelling. Patient states he has not seen any improvement since starting this mediation and feels like it has caused his BP to be too low. Will route to Dr. Gwenlyn Found for advisement.

## 2020-11-05 NOTE — Telephone Encounter (Signed)
Pt c/o medication issue:  1. Name of Medication:  metoprolol succinate (TOPROL XL) 25 MG 24 hr tablet 2. How are you currently taking this medication (dosage and times per day)?  Taking half a tablet per day, at night   3. Are you having a reaction (difficulty breathing--STAT)?  no 4. What is your medication issue? Patient states that his blood pressure has still not went up yet, this morning it was 112/59 . Yesterday (11/16) his reading was 110/?  Patient is concerned it is the metoprolol causing this issue. Please call/advise.

## 2020-11-06 NOTE — Telephone Encounter (Signed)
I have called the patient and explained that the current blood pressure is normal.  Heart rate has also improved after starting on the metoprolol, currently in the 70s.  Patient denies any dizziness, blurred vision or feeling of passing out.  We will continue on the current therapy.

## 2020-11-26 ENCOUNTER — Encounter: Payer: Self-pay | Admitting: Cardiology

## 2020-11-26 ENCOUNTER — Other Ambulatory Visit: Payer: Self-pay

## 2020-11-26 ENCOUNTER — Ambulatory Visit (INDEPENDENT_AMBULATORY_CARE_PROVIDER_SITE_OTHER): Payer: Medicare Other | Admitting: Cardiology

## 2020-11-26 VITALS — BP 122/62 | HR 63 | Ht 70.0 in | Wt 192.8 lb

## 2020-11-26 DIAGNOSIS — J441 Chronic obstructive pulmonary disease with (acute) exacerbation: Secondary | ICD-10-CM

## 2020-11-26 DIAGNOSIS — I4811 Longstanding persistent atrial fibrillation: Secondary | ICD-10-CM

## 2020-11-26 DIAGNOSIS — Z7901 Long term (current) use of anticoagulants: Secondary | ICD-10-CM

## 2020-11-26 DIAGNOSIS — N186 End stage renal disease: Secondary | ICD-10-CM

## 2020-11-26 DIAGNOSIS — I10 Essential (primary) hypertension: Secondary | ICD-10-CM

## 2020-11-26 DIAGNOSIS — I429 Cardiomyopathy, unspecified: Secondary | ICD-10-CM

## 2020-11-26 NOTE — Assessment & Plan Note (Signed)
New onset 02/2019 in setting of CAP , respiratory failure, septic shock, AKI. Failed cardioversion twice during March admission- Amio added then with plans to attempt eventual DCCV (delayed secondary to COPD, and COVID).  Meanwhile patient admitted again with respiratory failure in June 2020. He eventually appears to have permanent atrial fibrillation and Amiodarone D/C'd and low dose Toprol added Nov 2021

## 2020-11-26 NOTE — Assessment & Plan Note (Signed)
Now on peritoneal dialysis- Dr Deterding follows

## 2020-11-26 NOTE — Patient Instructions (Signed)

## 2020-11-26 NOTE — Assessment & Plan Note (Signed)
EF 45-50% by echo June 2020

## 2020-11-26 NOTE — Assessment & Plan Note (Signed)
Low B/P after Toprol added- he is doing better on reduced dose which he takes Q HS

## 2020-11-26 NOTE — Assessment & Plan Note (Signed)
Low dose Eliquis

## 2020-11-26 NOTE — Progress Notes (Signed)
Cardiology Office Note:    Date:  11/26/2020   ID:  JAGER KOSKA, DOB 1933/07/28, MRN [id number]  PCP:  Chesley Noon, MD  Cardiologist:  Quay Burow, MD  Electrophysiologist:  None   Referring MD: Chesley Noon, MD   No chief complaint on file.   History of Present Illness:    David Barton is a 86 y.o. male with a hx of presumed nonischemic cardiomyopathy, COPD, PAF, and end-stage renal disease.  The patient had cardiomyopathy in the setting of sepsis in 2018 which subsequently improved.  He was admitted in March 2020 with respiratory failure secondary to CAP.  During that admission he had septic shock as well as atrial fibrillation with RVR and acute diastolic heart failure.  Cardioversion was attempted then but failed.  He was placed on amiodarone with plans for outpatient cardio version but because of Covid this was delayed.  He was admitted again in June 2020 with respiratory failure.  He had a right pleural effusion which required thoracentesis of 1.8 L.  By this time he was now on dialysis, recently he has been transition to peritoneal dialysis.  He has remained in atrial fibrillation and Dr Gwenlyn Found stopped his Amiodarone and placed him on Toprol 25 mg daily.  The patient called a few days later with complaints of fatigue and low B/P.  His Toprol was reduced to 12.5mg  Q HS and he is tolerating this well.  He denies tachycardia.  His HR is in the 60's today in the office.   Past Medical History:  Diagnosis Date  . Alcohol abuse   . Colon cancer ([place])   . COPD (chronic obstructive pulmonary disease) ([place])   . Emphysema of lung ([place])   . ESRD (end stage renal disease) ([place])   . Former tobacco use   . Hypertension   . Peripheral edema   . Sepsis ([place]) 10/2016   Aspiration PNA/C diff colitis    Past Surgical History:  Procedure Laterality Date  . A/V FISTULAGRAM N/A 10/19/2019   Procedure: A/V FISTULAGRAM- Left Arm;  Surgeon: Angelia Mould, MD;   Location: [facility];  Service: Cardiovascular;  Laterality: N/A;  . AV FISTULA PLACEMENT Left 06/14/2019   Procedure: Creation of Left arm arteriovenous fistula;  Surgeon: Rosetta Posner, MD;  Location: [facility];  Service: Vascular;  Laterality: Left;  . CARDIOVERSION N/A 03/16/2019   Procedure: CARDIOVERSION;  Surgeon: Pixie Casino, MD;  Location: [facility] ENDOSCOPY;  Service: Cardiovascular;  Laterality: N/A;  . CARDIOVERSION N/A 03/23/2019   Procedure: CARDIOVERSION;  Surgeon: Lelon Perla, MD;  Location: [facility] ENDOSCOPY;  Service: Cardiovascular;  Laterality: N/A;  . CATARACT EXTRACTION    . COLON RESECTION    . COLONOSCOPY    . IMPLANTATION BONE ANCHORED HEARING AID Left 2016  . IR FLUORO GUIDE CV LINE RIGHT  06/09/2019  . IR THORACENTESIS ASP PLEURAL SPACE W/IMG GUIDE  05/21/2019  . IR US GUIDE VASC ACCESS RIGHT  06/09/2019  . MINOR HEMORRHOIDECTOMY  1995  . TEE WITHOUT CARDIOVERSION N/A 03/16/2019   Procedure: TRANSESOPHAGEAL ECHOCARDIOGRAM (TEE);  Surgeon: Pixie Casino, MD;  Location: [facility] ENDOSCOPY;  Service: Cardiovascular;  Laterality: N/A;  . TONSILLECTOMY      Current Medications: Current Meds  Medication Sig  . ELIQUIS 2.5 MG TABS tablet TAKE 1 TABLET(2.5 MG) BY MOUTH TWICE DAILY  . Fluticasone-Umeclidin-Vilant (TRELEGY ELLIPTA) 100-62.5-25 MCG/INH AEPB Inhale 1 puff into the lungs daily.  . Fluticasone-Umeclidin-Vilant (TRELEGY ELLIPTA) 100-62.5-25  MCG/INH AEPB Inhale 1 puff into the lungs daily.  . metoprolol succinate (TOPROL XL) 25 MG 24 hr tablet Take 1 tablet (25 mg total) by mouth daily.  . multivitamin (RENA-VIT) TABS tablet Take 1 tablet by mouth at bedtime.  . sevelamer carbonate (RENVELA) 800 MG tablet Take 1 tablet (800 mg total) by mouth 3 (three) times daily with meals.     Allergies:   Amlodipine and Tizanidine   Social History   Socioeconomic History  . Marital status: Married    Spouse name: Not on file  . Number of children: 3  . Years of  education: Not on file  . Highest education level: Not on file  Occupational History  . Occupation: Retired- Associate Professor  Tobacco Use  . Smoking status: Former Smoker    Packs/day: 1.00    Years: 20.00    Pack years: 20.00    Types: Cigarettes    Quit date: 12/20/1981    Years since quitting: 38.9  . Smokeless tobacco: Never Used  Vaping Use  . Vaping Use: Never used  Substance and Sexual Activity  . Alcohol use: Yes    Alcohol/week: 7.0 standard drinks    Types: 7 Standard drinks or equivalent per week    Comment: states he drinks 3-4 days a week, "a couple" on those occasions but does not further quaniity.  . Drug use: No  . Sexual activity: Never  Other Topics Concern  . Not on file  Social History Narrative  . Not on file   Social Determinants of Health   Financial Resource Strain:   . Difficulty of Paying Living Expenses: Not on file  Food Insecurity:   . Worried About Charity fundraiser in the Last Year: Not on file  . Ran Out of Food in the Last Year: Not on file  Transportation Needs:   . Lack of Transportation (Medical): Not on file  . Lack of Transportation (Non-Medical): Not on file  Physical Activity:   . Days of Exercise per Week: Not on file  . Minutes of Exercise per Session: Not on file  Stress:   . Feeling of Stress : Not on file  Social Connections:   . Frequency of Communication with Friends and Family: Not on file  . Frequency of Social Gatherings with Friends and Family: Not on file  . Attends Religious Services: Not on file  . Active Member of Clubs or Organizations: Not on file  . Attends Archivist Meetings: Not on file  . Marital Status: Not on file     Family History: The patient's family history includes Brain cancer in his father; Colon cancer in his mother; Lung cancer in his brother.  ROS:   Please see the history of present illness.     All other systems reviewed and are negative.  EKGs/Labs/Other Studies  Reviewed:    The following studies were reviewed today:  Echo 05/21/2019- IMPRESSIONS    1. The left ventricle has mildly reduced systolic function, with an  ejection fraction of 45-50%. The cavity size was normal. There is mildly  increased left ventricular wall thickness. Left ventricular diastolic  Doppler parameters are indeterminate.  2. LV endocardium difficult to see (No Definity used). EF appears to be  ~45-50% with hypokinesis of the inferolateral, inferoseptal and  infero-apical myocardium. Consider repeat imaging with Definity to better  assess.  3. Left atrial size was moderately dilated.  4. Right atrial size was moderately dilated.  5.  Small pericardial effusion.  6. The pericardial effusion is posterior to the left ventricle and  circumferential.  7. Mild calcification of the mitral valve leaflet.  8. The aortic valve is tricuspid. Moderate calcification of the aortic  valve. No stenosis of the aortic valve.  9. The aortic root and ascending aorta are normal in size and structure.  10. The inferior vena cava was dilated in size with <50% respiratory  variability.  11. The interatrial septum was not well visualized.   EKG:  EKG is not ordered today.  The ekg ordered  demonstrates AF with VR 96, RBBB  Recent Labs: No results found for requested labs within last 8760 hours.  Recent Lipid Panel    Component Value Date/Time   CHOL 106 11/10/2016 0331   TRIG 100 03/05/2019 0539   HDL 60 11/10/2016 0331   CHOLHDL 1.8 11/10/2016 0331   VLDL 17 11/10/2016 0331   LDLCALC 29 11/10/2016 0331    Physical Exam:    VS:  BP 122/62   Pulse 63   Ht 5\' 10"  (1.778 m)   Wt 192 lb 12.8 oz (87.5 kg)   SpO2 93%   BMI 27.66 kg/m     Wt Readings from Last 3 Encounters:  11/26/20 192 lb 12.8 oz (87.5 kg)  10/28/20 183 lb (83 kg)  10/17/20 185 lb 12.8 oz (84.3 kg)     GEN:  Well nourished, well developed in no acute distress HEENT: Normal NECK: No JVD; No  carotid bruits CARDIAC: irregularly irregular, no murmurs, rubs, gallops RESPIRATORY:  Clear to auscultation without rales, wheezing or rhonchi, decreased Rt base ABDOMEN: Soft, non-tender, non-distended, peritoneal catheter in place  MUSCULOSKELETAL:  No edema; No deformity  SKIN: Warm and dry NEUROLOGIC:  Alert and oriented x 3 PSYCHIATRIC:  Normal affect   ASSESSMENT:    Longstanding persistent atrial fibrillation ([place]) New onset 02/2019 in setting of CAP , respiratory failure, septic shock, AKI. Failed cardioversion twice during March admission- Amio added then with plans to attempt eventual DCCV (delayed secondary to COPD, and COVID).  Meanwhile patient admitted again with respiratory failure in June 2020. He eventually appears to have permanent atrial fibrillation and Amiodarone D/C'd and low dose Toprol added Nov 2021  ESRD (end stage renal disease) [facility]) Now on peritoneal dialysis- Dr Deterding follows  Essential hypertension Low B/P after Toprol added- he is doing better on reduced dose which he takes Q HS  Chronic anticoagulation Low dose Eliquis  Cardiomyopathy ([place]) EF 45-50% by echo June 2020  PLAN:    Same rx- f/u Dr Gwenlyn Found in 6 months.    Medication Adjustments/Labs and Tests Ordered: Current medicines are reviewed at length with the patient today.  Concerns regarding medicines are outlined above.  No orders of the defined types were placed in this encounter.  No orders of the defined types were placed in this encounter.   Patient Instructions  Medication Instructions:  Continue current medications  *If you need a refill on your cardiac medications before your next appointment, please call your pharmacy*   Lab Work: None Ordered  Testing/Procedures: None Ordered   Follow-Up: At [facility], you and your health needs are our priority.  As part of our continuing mission to provide you with exceptional heart care, we have created designated Provider  Care Teams.  These Care Teams include your primary Cardiologist (physician) and Advanced Practice Providers (APPs -  Physician Assistants and Nurse Practitioners) who all work together to provide you with the  care you need, when you need it.  We recommend signing up for the patient portal called "MyChart".  Sign up information is provided on this After Visit Summary.  MyChart is used to connect with patients for Virtual Visits (Telemedicine).  Patients are able to view lab/test results, encounter notes, upcoming appointments, etc.  Non-urgent messages can be sent to your provider as well.   To learn more about what you can do with MyChart, go to [email or web address].    Your next appointment:   6 month(s)  The format for your next appointment:   In Person  Provider:   You may see Quay Burow, MD or one of the following Advanced Practice Providers on your designated Care Team:    Kerin Ransom, PA-C  [place], [place]  Coletta Memos, FNP        Signed, Kerin Ransom, [place]  11/26/2020 11:37 AM    [place]

## 2020-12-20 DIAGNOSIS — D513 Other dietary vitamin B12 deficiency anemia: Secondary | ICD-10-CM | POA: Diagnosis not present

## 2020-12-20 DIAGNOSIS — N2589 Other disorders resulting from impaired renal tubular function: Secondary | ICD-10-CM | POA: Diagnosis not present

## 2020-12-20 DIAGNOSIS — R82998 Other abnormal findings in urine: Secondary | ICD-10-CM | POA: Diagnosis not present

## 2020-12-20 DIAGNOSIS — D509 Iron deficiency anemia, unspecified: Secondary | ICD-10-CM | POA: Diagnosis not present

## 2020-12-20 DIAGNOSIS — Z4932 Encounter for adequacy testing for peritoneal dialysis: Secondary | ICD-10-CM | POA: Diagnosis not present

## 2020-12-20 DIAGNOSIS — D631 Anemia in chronic kidney disease: Secondary | ICD-10-CM | POA: Diagnosis not present

## 2020-12-20 DIAGNOSIS — N2581 Secondary hyperparathyroidism of renal origin: Secondary | ICD-10-CM | POA: Diagnosis not present

## 2020-12-20 DIAGNOSIS — Z992 Dependence on renal dialysis: Secondary | ICD-10-CM | POA: Diagnosis not present

## 2020-12-20 DIAGNOSIS — E876 Hypokalemia: Secondary | ICD-10-CM | POA: Diagnosis not present

## 2020-12-20 DIAGNOSIS — N186 End stage renal disease: Secondary | ICD-10-CM | POA: Diagnosis not present

## 2020-12-21 DIAGNOSIS — Z992 Dependence on renal dialysis: Secondary | ICD-10-CM | POA: Diagnosis not present

## 2020-12-21 DIAGNOSIS — R82998 Other abnormal findings in urine: Secondary | ICD-10-CM | POA: Diagnosis not present

## 2020-12-21 DIAGNOSIS — E876 Hypokalemia: Secondary | ICD-10-CM | POA: Diagnosis not present

## 2020-12-21 DIAGNOSIS — N186 End stage renal disease: Secondary | ICD-10-CM | POA: Diagnosis not present

## 2020-12-21 DIAGNOSIS — N2589 Other disorders resulting from impaired renal tubular function: Secondary | ICD-10-CM | POA: Diagnosis not present

## 2020-12-21 DIAGNOSIS — D513 Other dietary vitamin B12 deficiency anemia: Secondary | ICD-10-CM | POA: Diagnosis not present

## 2020-12-21 DIAGNOSIS — N2581 Secondary hyperparathyroidism of renal origin: Secondary | ICD-10-CM | POA: Diagnosis not present

## 2020-12-21 DIAGNOSIS — D509 Iron deficiency anemia, unspecified: Secondary | ICD-10-CM | POA: Diagnosis not present

## 2020-12-21 DIAGNOSIS — D631 Anemia in chronic kidney disease: Secondary | ICD-10-CM | POA: Diagnosis not present

## 2020-12-21 DIAGNOSIS — Z4932 Encounter for adequacy testing for peritoneal dialysis: Secondary | ICD-10-CM | POA: Diagnosis not present

## 2020-12-22 DIAGNOSIS — N2581 Secondary hyperparathyroidism of renal origin: Secondary | ICD-10-CM | POA: Diagnosis not present

## 2020-12-22 DIAGNOSIS — E876 Hypokalemia: Secondary | ICD-10-CM | POA: Diagnosis not present

## 2020-12-22 DIAGNOSIS — N186 End stage renal disease: Secondary | ICD-10-CM | POA: Diagnosis not present

## 2020-12-22 DIAGNOSIS — Z992 Dependence on renal dialysis: Secondary | ICD-10-CM | POA: Diagnosis not present

## 2020-12-22 DIAGNOSIS — Z4932 Encounter for adequacy testing for peritoneal dialysis: Secondary | ICD-10-CM | POA: Diagnosis not present

## 2020-12-22 DIAGNOSIS — N2589 Other disorders resulting from impaired renal tubular function: Secondary | ICD-10-CM | POA: Diagnosis not present

## 2020-12-22 DIAGNOSIS — R82998 Other abnormal findings in urine: Secondary | ICD-10-CM | POA: Diagnosis not present

## 2020-12-22 DIAGNOSIS — D631 Anemia in chronic kidney disease: Secondary | ICD-10-CM | POA: Diagnosis not present

## 2020-12-22 DIAGNOSIS — D509 Iron deficiency anemia, unspecified: Secondary | ICD-10-CM | POA: Diagnosis not present

## 2020-12-22 DIAGNOSIS — D513 Other dietary vitamin B12 deficiency anemia: Secondary | ICD-10-CM | POA: Diagnosis not present

## 2020-12-23 ENCOUNTER — Other Ambulatory Visit: Payer: Self-pay

## 2020-12-23 ENCOUNTER — Telehealth: Payer: Self-pay | Admitting: Internal Medicine

## 2020-12-23 ENCOUNTER — Ambulatory Visit: Payer: Medicare Other

## 2020-12-23 ENCOUNTER — Ambulatory Visit (INDEPENDENT_AMBULATORY_CARE_PROVIDER_SITE_OTHER): Payer: Medicare Other | Admitting: Primary Care

## 2020-12-23 ENCOUNTER — Encounter: Payer: Self-pay | Admitting: Primary Care

## 2020-12-23 DIAGNOSIS — J441 Chronic obstructive pulmonary disease with (acute) exacerbation: Secondary | ICD-10-CM

## 2020-12-23 DIAGNOSIS — J029 Acute pharyngitis, unspecified: Secondary | ICD-10-CM | POA: Diagnosis not present

## 2020-12-23 DIAGNOSIS — B349 Viral infection, unspecified: Secondary | ICD-10-CM

## 2020-12-23 DIAGNOSIS — Z992 Dependence on renal dialysis: Secondary | ICD-10-CM | POA: Diagnosis not present

## 2020-12-23 DIAGNOSIS — D509 Iron deficiency anemia, unspecified: Secondary | ICD-10-CM | POA: Diagnosis not present

## 2020-12-23 DIAGNOSIS — N2589 Other disorders resulting from impaired renal tubular function: Secondary | ICD-10-CM | POA: Diagnosis not present

## 2020-12-23 DIAGNOSIS — E876 Hypokalemia: Secondary | ICD-10-CM | POA: Diagnosis not present

## 2020-12-23 DIAGNOSIS — R82998 Other abnormal findings in urine: Secondary | ICD-10-CM | POA: Diagnosis not present

## 2020-12-23 DIAGNOSIS — D631 Anemia in chronic kidney disease: Secondary | ICD-10-CM | POA: Diagnosis not present

## 2020-12-23 DIAGNOSIS — N186 End stage renal disease: Secondary | ICD-10-CM | POA: Diagnosis not present

## 2020-12-23 DIAGNOSIS — N2581 Secondary hyperparathyroidism of renal origin: Secondary | ICD-10-CM | POA: Diagnosis not present

## 2020-12-23 DIAGNOSIS — Z4932 Encounter for adequacy testing for peritoneal dialysis: Secondary | ICD-10-CM | POA: Diagnosis not present

## 2020-12-23 DIAGNOSIS — D513 Other dietary vitamin B12 deficiency anemia: Secondary | ICD-10-CM | POA: Diagnosis not present

## 2020-12-23 NOTE — Telephone Encounter (Signed)
Needs televisit with NP - if no slots can add him on the afternoon and I'll call him before the end of the day

## 2020-12-23 NOTE — Telephone Encounter (Signed)
Pt c/o body aches, fatigue, sore throat, difficulty swallowing, headache, pnd, sometimes prod cough with yellow mucus.  Denies chest pain/tightness, fever, loss of taste/smell, nausea/vomiting/diarhhea.  States he feels like he has the flu.   Taking tylenol daily, trelegy daily, making sure he gets adequate fluids. Requesting additional recs.   Pt has been covid tested X2 since feeling ill- first test 12/23 was negative, second test 12/31 was negative.  Pt couldn't tell me if this was PCR or rapid test, but based on short turnaround time it sounds like rapid test.  Pt knew results same day.   Pharmacy: [facility] on [address].   MW please advise on additional recs.  Thanks!

## 2020-12-23 NOTE — Progress Notes (Signed)
Virtual Visit via Telephone Note  I connected with David Barton on 12/23/20 at  3:30 PM EST by telephone and verified that I am speaking with the correct person using two identifiers.  Location: Patient: Home Provider: Office   I discussed the limitations, risks, security and privacy concerns of performing an evaluation and management service by telephone and the availability of in person appointments. I also discussed with the patient that there may be a patient responsible charge related to this service. The patient expressed understanding and agreed to proceed.   History of Present Illness: 87 year old male, quit 1983 (20-pack-year history).  Past medical history significant for COPD Gold 2, chronic bilateral pleural effusion, pulmonary edema, chronic respiratory failure, sleep apnea, hypertension, chronic diastolic heart failure, cardiomyopathy, A. fib, aphasia, end-stage renal disease.  12/23/2020 Contacted our office today for complaints of body aches, fatigue, sore throat, difficulty swallowing, headache, postnasal drip, productive cough with yellow mucus. His symptoms started Friday 12/19/20. Feels like he has the flu.  He tested negative for covid on 12/11/20 and 12/19/20. Patient unsure if test was PCR or rapid. Results were known same day so likely rapid. He has received all three covid vaccines. He denies any trouble breathing or wheezing.    Observations/Objective:  - Able to speak in full sentences; no overt shortness of breath or wheezing   Assessment and Plan:  Viral illness: - Patient reports body aches, fatigue, sore throat, HA - Covid negative x 2  - Encourage rest, oral fluids and tylenol q 6 hours for fever/chills  - Orders: flu swab and strep culture   Bronchitis: - Productive cough with yellow mucus x 4 days  - Continue Trelegy 100 one puff daily  - Encourage mucinex 600mg  twice daily  - Orders: CXR to rule out pneumonia / consider abx if testing negative for  potential COPD exacerbation   Follow Up Instructions:  - FU after testing or sooner if symptoms do not improve  I discussed the assessment and treatment plan with the patient. The patient was provided an opportunity to ask questions and all were answered. The patient agreed with the plan and demonstrated an understanding of the instructions.   The patient was advised to call back or seek an in-person evaluation if the symptoms worsen or if the condition fails to improve as anticipated.  I provided 18 minutes of non-face-to-face time during this encounter.   Martyn Ehrich, NP

## 2020-12-23 NOTE — Telephone Encounter (Signed)
Spoke with pt, aware of recs.  Scheduled for televisit with Beth at 3:30 today.  Nothing further needed at this time- will close encounter.

## 2020-12-24 ENCOUNTER — Telehealth: Payer: Self-pay | Admitting: Internal Medicine

## 2020-12-24 ENCOUNTER — Other Ambulatory Visit: Payer: Self-pay

## 2020-12-24 ENCOUNTER — Ambulatory Visit (INDEPENDENT_AMBULATORY_CARE_PROVIDER_SITE_OTHER)
Admission: RE | Admit: 2020-12-24 | Discharge: 2020-12-24 | Disposition: A | Discharge status: Home / Self Care | Payer: Medicare Other | Source: Ambulatory Visit | Attending: Primary Care | Admitting: Primary Care

## 2020-12-24 ENCOUNTER — Other Ambulatory Visit: Payer: Medicare Other

## 2020-12-24 DIAGNOSIS — D631 Anemia in chronic kidney disease: Secondary | ICD-10-CM | POA: Diagnosis not present

## 2020-12-24 DIAGNOSIS — N2589 Other disorders resulting from impaired renal tubular function: Secondary | ICD-10-CM | POA: Diagnosis not present

## 2020-12-24 DIAGNOSIS — I517 Cardiomegaly: Secondary | ICD-10-CM | POA: Diagnosis not present

## 2020-12-24 DIAGNOSIS — J029 Acute pharyngitis, unspecified: Secondary | ICD-10-CM

## 2020-12-24 DIAGNOSIS — E876 Hypokalemia: Secondary | ICD-10-CM | POA: Diagnosis not present

## 2020-12-24 DIAGNOSIS — Z4932 Encounter for adequacy testing for peritoneal dialysis: Secondary | ICD-10-CM | POA: Diagnosis not present

## 2020-12-24 DIAGNOSIS — J441 Chronic obstructive pulmonary disease with (acute) exacerbation: Secondary | ICD-10-CM

## 2020-12-24 DIAGNOSIS — N186 End stage renal disease: Secondary | ICD-10-CM | POA: Diagnosis not present

## 2020-12-24 DIAGNOSIS — N2581 Secondary hyperparathyroidism of renal origin: Secondary | ICD-10-CM | POA: Diagnosis not present

## 2020-12-24 DIAGNOSIS — Z992 Dependence on renal dialysis: Secondary | ICD-10-CM | POA: Diagnosis not present

## 2020-12-24 DIAGNOSIS — R059 Cough, unspecified: Secondary | ICD-10-CM | POA: Diagnosis not present

## 2020-12-24 DIAGNOSIS — D509 Iron deficiency anemia, unspecified: Secondary | ICD-10-CM | POA: Diagnosis not present

## 2020-12-24 DIAGNOSIS — R82998 Other abnormal findings in urine: Secondary | ICD-10-CM | POA: Diagnosis not present

## 2020-12-24 DIAGNOSIS — D513 Other dietary vitamin B12 deficiency anemia: Secondary | ICD-10-CM | POA: Diagnosis not present

## 2020-12-24 LAB — RESPIRATORY VIRUS PANEL
Influenza A RNA: NOT DETECTED
Influenza B RNA: NOT DETECTED
RSV RNA: NOT DETECTED
hMPV: NOT DETECTED

## 2020-12-24 LAB — STREP COMPLETE PANEL
Strep Pyogenes: NEGATIVE
Strep dysgalactiae: NEGATIVE

## 2020-12-24 NOTE — Progress Notes (Signed)
Please call patient and let him know resp viral panel (flu/RSV) and strep were negative. CXR showed evidence of interstitial edema versus pneumonitis which is inflammation of lung not an infection. If he still is not doing better by end of the week recommend in office apt

## 2020-12-24 NOTE — Addendum Note (Signed)
Addended by: Martyn Ehrich on: 12/24/2020 09:59 AM   Modules accepted: Orders

## 2020-12-24 NOTE — Telephone Encounter (Signed)
Please call patient and let him know resp viral panel (flu/RSV) and strep were negative. CXR showed evidence of interstitial edema versus pneumonitis which is inflammation of lung not an infection. If he still is not doing better by end of the week recommend in office apt  ----------  Left detailed message on patient's named voicemail (ok per dpr).  Will leave open to follow up on tomorrow- pt was weak and could not walk to car today when I performed his viral panel swab, want to make sure he is not worsening.

## 2020-12-24 NOTE — Telephone Encounter (Signed)
Called and spoke with pt in regards to call received from [facility] with [place] xray. Asked pt if he feels like he is full of fluid and he states that he does not feel like he is overloaded with fluid. Pt is also not on any lasix. Pt did get xray done at [facility] but they were not able to do the flu or strep tests there and due to this, pt is being sent back to our office to have these done. Per Irma, the wrong tests were ordered and this has been fixed. Pt will have swabs done and then the tests will be sent back to [facility] lab.

## 2020-12-25 DIAGNOSIS — D631 Anemia in chronic kidney disease: Secondary | ICD-10-CM | POA: Diagnosis not present

## 2020-12-25 DIAGNOSIS — D513 Other dietary vitamin B12 deficiency anemia: Secondary | ICD-10-CM | POA: Diagnosis not present

## 2020-12-25 DIAGNOSIS — Z992 Dependence on renal dialysis: Secondary | ICD-10-CM | POA: Diagnosis not present

## 2020-12-25 DIAGNOSIS — Z4932 Encounter for adequacy testing for peritoneal dialysis: Secondary | ICD-10-CM | POA: Diagnosis not present

## 2020-12-25 DIAGNOSIS — N2581 Secondary hyperparathyroidism of renal origin: Secondary | ICD-10-CM | POA: Diagnosis not present

## 2020-12-25 DIAGNOSIS — N2589 Other disorders resulting from impaired renal tubular function: Secondary | ICD-10-CM | POA: Diagnosis not present

## 2020-12-25 DIAGNOSIS — D509 Iron deficiency anemia, unspecified: Secondary | ICD-10-CM | POA: Diagnosis not present

## 2020-12-25 DIAGNOSIS — E876 Hypokalemia: Secondary | ICD-10-CM | POA: Diagnosis not present

## 2020-12-25 DIAGNOSIS — R82998 Other abnormal findings in urine: Secondary | ICD-10-CM | POA: Diagnosis not present

## 2020-12-25 DIAGNOSIS — N186 End stage renal disease: Secondary | ICD-10-CM | POA: Diagnosis not present

## 2020-12-25 NOTE — Telephone Encounter (Signed)
Called and spoke to pt. Attempted to tell pt the results of the labs however, the pt states 'I already have the information I need.' I asked if I could give the results to the pt and asked if he was feeling ok, he said 'no, and I already have the information. Bye.' Pt then disconnected the phone. I do not see where anyone else has attempted to call pt for results.   Caryl Pina, do you want to try and call him back?

## 2020-12-26 DIAGNOSIS — E876 Hypokalemia: Secondary | ICD-10-CM | POA: Diagnosis not present

## 2020-12-26 DIAGNOSIS — D513 Other dietary vitamin B12 deficiency anemia: Secondary | ICD-10-CM | POA: Diagnosis not present

## 2020-12-26 DIAGNOSIS — N2581 Secondary hyperparathyroidism of renal origin: Secondary | ICD-10-CM | POA: Diagnosis not present

## 2020-12-26 DIAGNOSIS — N2589 Other disorders resulting from impaired renal tubular function: Secondary | ICD-10-CM | POA: Diagnosis not present

## 2020-12-26 DIAGNOSIS — D631 Anemia in chronic kidney disease: Secondary | ICD-10-CM | POA: Diagnosis not present

## 2020-12-26 DIAGNOSIS — Z4932 Encounter for adequacy testing for peritoneal dialysis: Secondary | ICD-10-CM | POA: Diagnosis not present

## 2020-12-26 DIAGNOSIS — D509 Iron deficiency anemia, unspecified: Secondary | ICD-10-CM | POA: Diagnosis not present

## 2020-12-26 DIAGNOSIS — N186 End stage renal disease: Secondary | ICD-10-CM | POA: Diagnosis not present

## 2020-12-26 DIAGNOSIS — Z992 Dependence on renal dialysis: Secondary | ICD-10-CM | POA: Diagnosis not present

## 2020-12-26 DIAGNOSIS — R82998 Other abnormal findings in urine: Secondary | ICD-10-CM | POA: Diagnosis not present

## 2020-12-27 DIAGNOSIS — D509 Iron deficiency anemia, unspecified: Secondary | ICD-10-CM | POA: Diagnosis not present

## 2020-12-27 DIAGNOSIS — E876 Hypokalemia: Secondary | ICD-10-CM | POA: Diagnosis not present

## 2020-12-27 DIAGNOSIS — R82998 Other abnormal findings in urine: Secondary | ICD-10-CM | POA: Diagnosis not present

## 2020-12-27 DIAGNOSIS — N186 End stage renal disease: Secondary | ICD-10-CM | POA: Diagnosis not present

## 2020-12-27 DIAGNOSIS — N2589 Other disorders resulting from impaired renal tubular function: Secondary | ICD-10-CM | POA: Diagnosis not present

## 2020-12-27 DIAGNOSIS — N2581 Secondary hyperparathyroidism of renal origin: Secondary | ICD-10-CM | POA: Diagnosis not present

## 2020-12-27 DIAGNOSIS — Z992 Dependence on renal dialysis: Secondary | ICD-10-CM | POA: Diagnosis not present

## 2020-12-27 DIAGNOSIS — D631 Anemia in chronic kidney disease: Secondary | ICD-10-CM | POA: Diagnosis not present

## 2020-12-27 DIAGNOSIS — Z4932 Encounter for adequacy testing for peritoneal dialysis: Secondary | ICD-10-CM | POA: Diagnosis not present

## 2020-12-27 DIAGNOSIS — D513 Other dietary vitamin B12 deficiency anemia: Secondary | ICD-10-CM | POA: Diagnosis not present

## 2020-12-28 DIAGNOSIS — R82998 Other abnormal findings in urine: Secondary | ICD-10-CM | POA: Diagnosis not present

## 2020-12-28 DIAGNOSIS — D631 Anemia in chronic kidney disease: Secondary | ICD-10-CM | POA: Diagnosis not present

## 2020-12-28 DIAGNOSIS — Z4932 Encounter for adequacy testing for peritoneal dialysis: Secondary | ICD-10-CM | POA: Diagnosis not present

## 2020-12-28 DIAGNOSIS — N2589 Other disorders resulting from impaired renal tubular function: Secondary | ICD-10-CM | POA: Diagnosis not present

## 2020-12-28 DIAGNOSIS — N186 End stage renal disease: Secondary | ICD-10-CM | POA: Diagnosis not present

## 2020-12-28 DIAGNOSIS — E876 Hypokalemia: Secondary | ICD-10-CM | POA: Diagnosis not present

## 2020-12-28 DIAGNOSIS — Z992 Dependence on renal dialysis: Secondary | ICD-10-CM | POA: Diagnosis not present

## 2020-12-28 DIAGNOSIS — D513 Other dietary vitamin B12 deficiency anemia: Secondary | ICD-10-CM | POA: Diagnosis not present

## 2020-12-28 DIAGNOSIS — D509 Iron deficiency anemia, unspecified: Secondary | ICD-10-CM | POA: Diagnosis not present

## 2020-12-28 DIAGNOSIS — N2581 Secondary hyperparathyroidism of renal origin: Secondary | ICD-10-CM | POA: Diagnosis not present

## 2020-12-29 ENCOUNTER — Telehealth: Payer: Self-pay | Admitting: Internal Medicine

## 2020-12-29 DIAGNOSIS — Z992 Dependence on renal dialysis: Secondary | ICD-10-CM | POA: Diagnosis not present

## 2020-12-29 DIAGNOSIS — N2589 Other disorders resulting from impaired renal tubular function: Secondary | ICD-10-CM | POA: Diagnosis not present

## 2020-12-29 DIAGNOSIS — R82998 Other abnormal findings in urine: Secondary | ICD-10-CM | POA: Diagnosis not present

## 2020-12-29 DIAGNOSIS — E876 Hypokalemia: Secondary | ICD-10-CM | POA: Diagnosis not present

## 2020-12-29 DIAGNOSIS — D513 Other dietary vitamin B12 deficiency anemia: Secondary | ICD-10-CM | POA: Diagnosis not present

## 2020-12-29 DIAGNOSIS — N2581 Secondary hyperparathyroidism of renal origin: Secondary | ICD-10-CM | POA: Diagnosis not present

## 2020-12-29 DIAGNOSIS — N186 End stage renal disease: Secondary | ICD-10-CM | POA: Diagnosis not present

## 2020-12-29 DIAGNOSIS — Z4932 Encounter for adequacy testing for peritoneal dialysis: Secondary | ICD-10-CM | POA: Diagnosis not present

## 2020-12-29 DIAGNOSIS — D631 Anemia in chronic kidney disease: Secondary | ICD-10-CM | POA: Diagnosis not present

## 2020-12-29 DIAGNOSIS — D509 Iron deficiency anemia, unspecified: Secondary | ICD-10-CM | POA: Diagnosis not present

## 2020-12-29 NOTE — Telephone Encounter (Signed)
Spoke with the pt  He is c/o increased cough for the past wk  He states cough is non prod and usually only occurs at night  He denies any increased SOB, wheezing, chest tightness, f/c/s, aches  He has had covid vax x 3  Still on the trelegy and has not had to use his albuterol recently  Pt asking if there is something he could take for cough or if MW thinks he needs another cxr done  Please advise, thanks!  Pt had cxr on 12/24/20:  IMPRESSION: 1. Borderline cardiomegaly. No prominent pulmonary venous congestion. 2. Bilateral interstitial prominence consistent with interstitial edema and or pneumonitis. Tiny right pleural effusion cannot be excluded.   Electronically Signed   By: Marcello Moores  Register   On: 12/24/2020 09:42

## 2020-12-29 NOTE — Telephone Encounter (Signed)
Needs televisit - ok to schedule for today or am 1/11 and let him know I won't be calling until I finish with all my patients or in am before I starte,  but I can't help with a phone note alone

## 2020-12-29 NOTE — Telephone Encounter (Signed)
Called spoke with patient. Let him know Dr. Melvyn Novas would call him tomorrow.  He states he would like to be called at 10 I told him Dr. Melvyn Novas would call at end of day around 3 he said that would be just fine. He just doesn't want a phone call before 10am   Nothing further needed at this time.

## 2020-12-30 ENCOUNTER — Encounter: Payer: Self-pay | Admitting: Internal Medicine

## 2020-12-30 ENCOUNTER — Ambulatory Visit (INDEPENDENT_AMBULATORY_CARE_PROVIDER_SITE_OTHER): Payer: Medicare Other | Admitting: Internal Medicine

## 2020-12-30 ENCOUNTER — Other Ambulatory Visit: Payer: Self-pay

## 2020-12-30 DIAGNOSIS — J449 Chronic obstructive pulmonary disease, unspecified: Secondary | ICD-10-CM

## 2020-12-30 DIAGNOSIS — D509 Iron deficiency anemia, unspecified: Secondary | ICD-10-CM | POA: Diagnosis not present

## 2020-12-30 DIAGNOSIS — N186 End stage renal disease: Secondary | ICD-10-CM | POA: Diagnosis not present

## 2020-12-30 DIAGNOSIS — D513 Other dietary vitamin B12 deficiency anemia: Secondary | ICD-10-CM | POA: Diagnosis not present

## 2020-12-30 DIAGNOSIS — Z4932 Encounter for adequacy testing for peritoneal dialysis: Secondary | ICD-10-CM | POA: Diagnosis not present

## 2020-12-30 DIAGNOSIS — R82998 Other abnormal findings in urine: Secondary | ICD-10-CM | POA: Diagnosis not present

## 2020-12-30 DIAGNOSIS — Z992 Dependence on renal dialysis: Secondary | ICD-10-CM | POA: Diagnosis not present

## 2020-12-30 DIAGNOSIS — E876 Hypokalemia: Secondary | ICD-10-CM | POA: Diagnosis not present

## 2020-12-30 DIAGNOSIS — N2581 Secondary hyperparathyroidism of renal origin: Secondary | ICD-10-CM | POA: Diagnosis not present

## 2020-12-30 DIAGNOSIS — N2589 Other disorders resulting from impaired renal tubular function: Secondary | ICD-10-CM | POA: Diagnosis not present

## 2020-12-30 DIAGNOSIS — J90 Pleural effusion, not elsewhere classified: Secondary | ICD-10-CM

## 2020-12-30 DIAGNOSIS — D631 Anemia in chronic kidney disease: Secondary | ICD-10-CM | POA: Diagnosis not present

## 2020-12-30 MED ORDER — ALBUTEROL SULFATE HFA 108 (90 BASE) MCG/ACT IN AERS: INHALATION_SPRAY | RESPIRATORY_TRACT | Status: DC

## 2020-12-30 NOTE — Assessment & Plan Note (Signed)
R throaceneteis 05/21/19   =  1.85 l slt bloody exudative, nl glucose, nl cells on path review Repeat 06/02/19 x 1.4 liters Repeat 6/21/0  x 2.3 liters dark red  cyt neg  11/05/2019 resolved as of 11/05/2019 p  HD 11/03/2019  - 09/17/2020 minimal decreased aeration R base s obvious pl effusion  - 12/30/2020 cxr slt wet appearing cxr ? Pre or post HD with no def effusions   Keep on dry side if tolerates, keep f/u appt  Each maintenance medication was reviewed in detail including most importantly the difference between maintenance and as needed and under what circumstances the prns are to be used.  Please see AVS for specific  Instructions which are unique to this visit and I personally typed out  which were reviewed in detail over the phone with the patient and a copy provided via my chart

## 2020-12-30 NOTE — Patient Instructions (Addendum)
For cough / congestion >>>  mucinex dm 1200 mg every 12 hours as needed  (2 x 600 every 12 hours)   Plan A = Automatic = Always=    Continue trelegy one click   Plan B = Backup (to supplement plan A, not to replace it) Only use your albuterol inhaler as a rescue medication to be used if you can't catch your breath by resting or doing a relaxed purse lip breathing pattern.  - The less you use it, the better it will work when you need it. - Ok to use the inhaler up to 2 puffs  every 4 hours if you must but call for appointment if use goes up over your usual need - Don't leave home without it !!  (think of it like the spare tire for your car)   Also ok to try albuterol 15 min before an activity that you know would make you short of breath and see if it makes any difference and if makes none then don't take it after activity unless you can't catch your breath.  Keep your appt you have already - call sooner if losing ground

## 2020-12-30 NOTE — Assessment & Plan Note (Signed)
Quit smoking 1983  - PFTs 07/16/2013  FEV1  1.75 (61%) ratio 52 and 15% better p B2, DLCO 74% - 06/27/2019  After extensive coaching inhaler device,  effectiveness =    90% with dpi so changed to trelegy maint and proair respiclick> improved as of ov 08/03/2019    Group D in terms of symptom/risk and laba/lama/ICS  therefore appropriate rx at this point >>>  Continue trelegy    re saba: I spent extra time with pt today reviewing appropriate use of albuterol for prn use on exertion with the following points: 1) saba is for relief of sob that does not improve by walking a slower pace or resting but rather if the pt does not improve after trying this first. 2) If the pt is convinced, as many are, that saba helps recover from activity faster then it's easy to tell if this is the case by re-challenging : ie stop, take the inhaler, then p 5 minutes try the exact same activity (intensity of workload) that just caused the symptoms and see if they are substantially diminished or not after saba 3) if there is an activity that reproducibly causes the symptoms, try the saba 15 min before the activity on alternate days   If in fact the saba really does help, then fine to continue to use it prn but advised may need to look closer at the maintenance regimen being used to achieve better control of airways disease with exertion.  Appears to be getting over viral uri fine and covid r/o already so just rx cough prn mucinex dm 1200 mg every 12 hours as needed

## 2020-12-30 NOTE — Progress Notes (Signed)
David Barton, male    DOB: 06-20-1933,    MRN: [id number]   Brief patient profile:  86yowm  Quit smoking 1983  At wt 180 with baseline able to walk the dog 10 minutes slow pace but  avoided steps maint on spiriva with GOLD II COPD criteria 07/16/13     History of Present Illness  04/24/2019  Pulmonary/  office eval/David Barton / 02 4lpm 24/7 / persistent afib and started amiodarone since last ov Chief Complaint  Patient presents with  . Pulmonary Consult    Referred by Kerin Ransom, PA. Pt c/o SOB since March 2020. Pt c/o DOE with walking from room to room.  He has had some cough with clear to bloody sputum.     Dyspnea:  Gradually losing ground now Room to room gives out due to sob even on 4lpm  Cough: more bloody/ no pain with cough Sleep: on flat bed, 3 pillows on back SABA HBZ:JIRCVE helps the most  rec Stop spiriva and take the  duoneb up to 4 x in 24 hours as you feel you need it if it helps your shortness of breath For cough/congestion >  mucinex or mucinex dm up to 1200 mg every 12 hours and use flutter valve  as much as you can  Humidify 02 as much of the time as your can    06/27/2019  f/u ov/David Barton re:  S/p thoracentesis/ HD initiation on anoro and pulmocort Chief Complaint  Patient presents with  . Follow-up    Breathing is "ok" no new co's.   Dyspnea:  Room to room at home / on 3lpm  Cough: none Sleeping: flat in bed / two pillows  02: 3lpm 24/7  rec Plan A = Automatic = Trelegy one click - take two good drags  Plan B = Backup Only use your albuterol (proair resplick) as a rescue medication Plan C = Crisis - only use your albuterol nebulizer if you first try Plan B and it fails to help > ok to use the nebulizer up to every 4 hours but if start needing it regularly call for immediate appointment Stay on 3lpm at bedtime but ok to adjust during the day to keep your saturations over 90%     08/03/2019  f/u ov/David Barton re: f/u effusions / copd doing better on trelegy  Chief  Complaint  Patient presents with  . Follow-up    CXR repeated. Breathing has improved. He has not been using his albuterol inhaler or neb.   Dyspnea:  Says can walk "1000 yards" ( I believe he means feet)  off 02 but does not check level of  Treadmill x 5 min tired x 1.2 mph no grade at all, also does not check sats of 02  Cough: none  Sleeping: flat in bed, 2 pillows SABA use: none  02: 3lpm bedtime , not at all during the day rec The 02 saturation should at peak exercise once or twice a week with goal of keeping over 90%  Continue 3lpm at bedtime     11/05/2019  f/u ov/David Barton re: copd GOLD II on trelegy/  f/u bilateral effusions/ stopped 02 end of oct 2020 Chief Complaint  Patient presents with  . Follow-up    Breathing is doing well. He c/o occ fatigue.    Dyspnea:  teadmill x 35min RA x  1.2 mph 2 notches  Cough: no cough just some throat clearing, mild hoarseness on trelgy Sleeping: flat p sleeping pill x one  pillow x 2  SABA use: none  02: no longer using any 02 x 3 weeks  - was on 1pm when stopped  rec Have your wife check your oxygen level sleeping - we need to keep it around 90% or higher   07/01/20 NP ov  rec Stop Trelegy Start Stiolto Respimat- take 2 puffs once daily in the morning    09/17/2020  f/u ov/David Barton re:  Back on trelegy p breathing worse on stiolto  Chief Complaint  Patient presents with  . Follow-up  Dyspnea:  Not doing treadmill x one week due positional L back pain and both legs feel weak s numbness or radicular features to pain which radiates up to lower T spine but no pleuritic features, feels better supine with no assoc gi/gu symptoms Cough: no cough / but hoarse  Sleeping: flat in bed / one pillow / PD  SABA use: not using any 02: none   rec Arm and hammer   12/23/2020 Complaints of body aches, fatigue, sore throat, difficulty swallowing, headache, postnasal drip, productive cough with yellow mucus. His symptoms started  12/19/20. Feels like he  has the flu.  He tested negative for covid on 12/11/20 and 12/19/20. Patient unsure if test was PCR or rapid  He denies any trouble breathing or wheezing.  rec Viral illness: - Patient reports body aches, fatigue, sore throat, HA - Covid negative x 2  - Encourage rest, oral fluids and tylenol q 6 hours for fever/chills  - Orders: flu swab and strep culture> both neg    Bronchitis: - Productive cough with yellow mucus x 4 days  - Continue Trelegy 100 one puff daily  - Encourage mucinex 600 mg twice daily  - Orders: CXR to rule out pneumonia / consider abx if testing negative for potential COPD exacerbation          Virtual Visit via Telephone Note 12/30/2020   I connected with David Barton on 12/30/20 at  3:00 PM EST by telephone and verified that I am speaking with the correct person using two identifiers. Pt is at home and this call made from my office with no other participants    I discussed the limitations, risks, security and privacy concerns of performing an evaluation and management service by telephone and the availability of in person appointments. I also discussed with the patient that there may be a patient responsible charge related to this service. The patient expressed understanding and agreed to proceed.   History of Present Illness: On mucinex dm 600/30 but not full dose  Dyspnea: only with walking  Cough: mostly when head hits pillow at 30 degrees  Sleeping: cough does not keep him up  SABA use: not tried  02: none    No obvious day to day or daytime variability or assoc excess/ purulent sputum or mucus plugs or hemoptysis or cp or chest tightness, subjective wheeze or overt sinus or hb symptoms.    Also denies any obvious fluctuation of symptoms with weather or environmental changes or other aggravating or alleviating factors except as outlined above.   Meds reviewed/ med reconciliation completed     No outpatient medications have been marked as taking  for the 12/30/20 encounter (Office Visit) with Tanda Rockers, MD.         Observations/Objective: Very poor phone connection, cutting out and frustrated he can't be understood better "no breathing problem"  Morphs into " -breathing problem" / good voice texture, no conversational sob  I personally reviewed images and agree with radiology impression as follows:  CXR:   12/24/20 1. Borderline cardiomegaly. No prominent pulmonary venous congestion. 2. Bilateral interstitial prominence consistent with interstitial edema and or pneumonitis. Tiny right pleural effusion cannot be excluded.  Assessment and Plan: See problem list for active a/p's   Follow Up Instructions: See avs for instructions unique to this ov which includes revised/ updated med list     I discussed the assessment and treatment plan with the patient. The patient was provided an opportunity to ask questions and all were answered. The patient agreed with the plan and demonstrated an understanding of the instructions.   The patient was advised to call back or seek an in-person evaluation if the symptoms worsen or if the condition fails to improve as anticipated.  I provided  30 minutes of non-face-to-face time during this encounter.   Christinia Gully, MD

## 2020-12-31 DIAGNOSIS — E876 Hypokalemia: Secondary | ICD-10-CM | POA: Diagnosis not present

## 2020-12-31 DIAGNOSIS — N2581 Secondary hyperparathyroidism of renal origin: Secondary | ICD-10-CM | POA: Diagnosis not present

## 2020-12-31 DIAGNOSIS — N2589 Other disorders resulting from impaired renal tubular function: Secondary | ICD-10-CM | POA: Diagnosis not present

## 2020-12-31 DIAGNOSIS — D509 Iron deficiency anemia, unspecified: Secondary | ICD-10-CM | POA: Diagnosis not present

## 2020-12-31 DIAGNOSIS — R82998 Other abnormal findings in urine: Secondary | ICD-10-CM | POA: Diagnosis not present

## 2020-12-31 DIAGNOSIS — Z992 Dependence on renal dialysis: Secondary | ICD-10-CM | POA: Diagnosis not present

## 2020-12-31 DIAGNOSIS — N186 End stage renal disease: Secondary | ICD-10-CM | POA: Diagnosis not present

## 2020-12-31 DIAGNOSIS — D631 Anemia in chronic kidney disease: Secondary | ICD-10-CM | POA: Diagnosis not present

## 2020-12-31 DIAGNOSIS — D513 Other dietary vitamin B12 deficiency anemia: Secondary | ICD-10-CM | POA: Diagnosis not present

## 2020-12-31 DIAGNOSIS — Z4932 Encounter for adequacy testing for peritoneal dialysis: Secondary | ICD-10-CM | POA: Diagnosis not present

## 2021-01-01 DIAGNOSIS — D631 Anemia in chronic kidney disease: Secondary | ICD-10-CM | POA: Diagnosis not present

## 2021-01-01 DIAGNOSIS — Z4932 Encounter for adequacy testing for peritoneal dialysis: Secondary | ICD-10-CM | POA: Diagnosis not present

## 2021-01-01 DIAGNOSIS — N186 End stage renal disease: Secondary | ICD-10-CM | POA: Diagnosis not present

## 2021-01-01 DIAGNOSIS — E876 Hypokalemia: Secondary | ICD-10-CM | POA: Diagnosis not present

## 2021-01-01 DIAGNOSIS — N2581 Secondary hyperparathyroidism of renal origin: Secondary | ICD-10-CM | POA: Diagnosis not present

## 2021-01-01 DIAGNOSIS — R82998 Other abnormal findings in urine: Secondary | ICD-10-CM | POA: Diagnosis not present

## 2021-01-01 DIAGNOSIS — N2589 Other disorders resulting from impaired renal tubular function: Secondary | ICD-10-CM | POA: Diagnosis not present

## 2021-01-01 DIAGNOSIS — Z992 Dependence on renal dialysis: Secondary | ICD-10-CM | POA: Diagnosis not present

## 2021-01-01 DIAGNOSIS — D513 Other dietary vitamin B12 deficiency anemia: Secondary | ICD-10-CM | POA: Diagnosis not present

## 2021-01-01 DIAGNOSIS — D509 Iron deficiency anemia, unspecified: Secondary | ICD-10-CM | POA: Diagnosis not present

## 2021-01-02 DIAGNOSIS — N2581 Secondary hyperparathyroidism of renal origin: Secondary | ICD-10-CM | POA: Diagnosis not present

## 2021-01-02 DIAGNOSIS — R82998 Other abnormal findings in urine: Secondary | ICD-10-CM | POA: Diagnosis not present

## 2021-01-02 DIAGNOSIS — D509 Iron deficiency anemia, unspecified: Secondary | ICD-10-CM | POA: Diagnosis not present

## 2021-01-02 DIAGNOSIS — D631 Anemia in chronic kidney disease: Secondary | ICD-10-CM | POA: Diagnosis not present

## 2021-01-02 DIAGNOSIS — Z992 Dependence on renal dialysis: Secondary | ICD-10-CM | POA: Diagnosis not present

## 2021-01-02 DIAGNOSIS — D513 Other dietary vitamin B12 deficiency anemia: Secondary | ICD-10-CM | POA: Diagnosis not present

## 2021-01-02 DIAGNOSIS — E876 Hypokalemia: Secondary | ICD-10-CM | POA: Diagnosis not present

## 2021-01-02 DIAGNOSIS — N2589 Other disorders resulting from impaired renal tubular function: Secondary | ICD-10-CM | POA: Diagnosis not present

## 2021-01-02 DIAGNOSIS — Z4932 Encounter for adequacy testing for peritoneal dialysis: Secondary | ICD-10-CM | POA: Diagnosis not present

## 2021-01-02 DIAGNOSIS — N186 End stage renal disease: Secondary | ICD-10-CM | POA: Diagnosis not present

## 2021-01-03 DIAGNOSIS — Z992 Dependence on renal dialysis: Secondary | ICD-10-CM | POA: Diagnosis not present

## 2021-01-03 DIAGNOSIS — D509 Iron deficiency anemia, unspecified: Secondary | ICD-10-CM | POA: Diagnosis not present

## 2021-01-03 DIAGNOSIS — N2581 Secondary hyperparathyroidism of renal origin: Secondary | ICD-10-CM | POA: Diagnosis not present

## 2021-01-03 DIAGNOSIS — Z4932 Encounter for adequacy testing for peritoneal dialysis: Secondary | ICD-10-CM | POA: Diagnosis not present

## 2021-01-03 DIAGNOSIS — D631 Anemia in chronic kidney disease: Secondary | ICD-10-CM | POA: Diagnosis not present

## 2021-01-03 DIAGNOSIS — R82998 Other abnormal findings in urine: Secondary | ICD-10-CM | POA: Diagnosis not present

## 2021-01-03 DIAGNOSIS — N2589 Other disorders resulting from impaired renal tubular function: Secondary | ICD-10-CM | POA: Diagnosis not present

## 2021-01-03 DIAGNOSIS — D513 Other dietary vitamin B12 deficiency anemia: Secondary | ICD-10-CM | POA: Diagnosis not present

## 2021-01-03 DIAGNOSIS — N186 End stage renal disease: Secondary | ICD-10-CM | POA: Diagnosis not present

## 2021-01-03 DIAGNOSIS — E876 Hypokalemia: Secondary | ICD-10-CM | POA: Diagnosis not present

## 2021-01-04 DIAGNOSIS — N2589 Other disorders resulting from impaired renal tubular function: Secondary | ICD-10-CM | POA: Diagnosis not present

## 2021-01-04 DIAGNOSIS — N2581 Secondary hyperparathyroidism of renal origin: Secondary | ICD-10-CM | POA: Diagnosis not present

## 2021-01-04 DIAGNOSIS — D513 Other dietary vitamin B12 deficiency anemia: Secondary | ICD-10-CM | POA: Diagnosis not present

## 2021-01-04 DIAGNOSIS — E876 Hypokalemia: Secondary | ICD-10-CM | POA: Diagnosis not present

## 2021-01-04 DIAGNOSIS — Z992 Dependence on renal dialysis: Secondary | ICD-10-CM | POA: Diagnosis not present

## 2021-01-04 DIAGNOSIS — Z4932 Encounter for adequacy testing for peritoneal dialysis: Secondary | ICD-10-CM | POA: Diagnosis not present

## 2021-01-04 DIAGNOSIS — N186 End stage renal disease: Secondary | ICD-10-CM | POA: Diagnosis not present

## 2021-01-04 DIAGNOSIS — D509 Iron deficiency anemia, unspecified: Secondary | ICD-10-CM | POA: Diagnosis not present

## 2021-01-04 DIAGNOSIS — D631 Anemia in chronic kidney disease: Secondary | ICD-10-CM | POA: Diagnosis not present

## 2021-01-04 DIAGNOSIS — R82998 Other abnormal findings in urine: Secondary | ICD-10-CM | POA: Diagnosis not present

## 2021-01-05 DIAGNOSIS — D509 Iron deficiency anemia, unspecified: Secondary | ICD-10-CM | POA: Diagnosis not present

## 2021-01-05 DIAGNOSIS — D631 Anemia in chronic kidney disease: Secondary | ICD-10-CM | POA: Diagnosis not present

## 2021-01-05 DIAGNOSIS — N186 End stage renal disease: Secondary | ICD-10-CM | POA: Diagnosis not present

## 2021-01-05 DIAGNOSIS — Z992 Dependence on renal dialysis: Secondary | ICD-10-CM | POA: Diagnosis not present

## 2021-01-05 DIAGNOSIS — D513 Other dietary vitamin B12 deficiency anemia: Secondary | ICD-10-CM | POA: Diagnosis not present

## 2021-01-05 DIAGNOSIS — Z4932 Encounter for adequacy testing for peritoneal dialysis: Secondary | ICD-10-CM | POA: Diagnosis not present

## 2021-01-05 DIAGNOSIS — N2581 Secondary hyperparathyroidism of renal origin: Secondary | ICD-10-CM | POA: Diagnosis not present

## 2021-01-05 DIAGNOSIS — R82998 Other abnormal findings in urine: Secondary | ICD-10-CM | POA: Diagnosis not present

## 2021-01-05 DIAGNOSIS — N2589 Other disorders resulting from impaired renal tubular function: Secondary | ICD-10-CM | POA: Diagnosis not present

## 2021-01-05 DIAGNOSIS — E876 Hypokalemia: Secondary | ICD-10-CM | POA: Diagnosis not present

## 2021-01-06 DIAGNOSIS — E876 Hypokalemia: Secondary | ICD-10-CM | POA: Diagnosis not present

## 2021-01-06 DIAGNOSIS — Z4932 Encounter for adequacy testing for peritoneal dialysis: Secondary | ICD-10-CM | POA: Diagnosis not present

## 2021-01-06 DIAGNOSIS — N2589 Other disorders resulting from impaired renal tubular function: Secondary | ICD-10-CM | POA: Diagnosis not present

## 2021-01-06 DIAGNOSIS — N186 End stage renal disease: Secondary | ICD-10-CM | POA: Diagnosis not present

## 2021-01-06 DIAGNOSIS — D513 Other dietary vitamin B12 deficiency anemia: Secondary | ICD-10-CM | POA: Diagnosis not present

## 2021-01-06 DIAGNOSIS — Z992 Dependence on renal dialysis: Secondary | ICD-10-CM | POA: Diagnosis not present

## 2021-01-06 DIAGNOSIS — N2581 Secondary hyperparathyroidism of renal origin: Secondary | ICD-10-CM | POA: Diagnosis not present

## 2021-01-06 DIAGNOSIS — D631 Anemia in chronic kidney disease: Secondary | ICD-10-CM | POA: Diagnosis not present

## 2021-01-06 DIAGNOSIS — R82998 Other abnormal findings in urine: Secondary | ICD-10-CM | POA: Diagnosis not present

## 2021-01-06 DIAGNOSIS — D509 Iron deficiency anemia, unspecified: Secondary | ICD-10-CM | POA: Diagnosis not present

## 2021-01-07 DIAGNOSIS — N186 End stage renal disease: Secondary | ICD-10-CM | POA: Diagnosis not present

## 2021-01-07 DIAGNOSIS — N2581 Secondary hyperparathyroidism of renal origin: Secondary | ICD-10-CM | POA: Diagnosis not present

## 2021-01-07 DIAGNOSIS — E876 Hypokalemia: Secondary | ICD-10-CM | POA: Diagnosis not present

## 2021-01-07 DIAGNOSIS — Z4932 Encounter for adequacy testing for peritoneal dialysis: Secondary | ICD-10-CM | POA: Diagnosis not present

## 2021-01-07 DIAGNOSIS — N2589 Other disorders resulting from impaired renal tubular function: Secondary | ICD-10-CM | POA: Diagnosis not present

## 2021-01-07 DIAGNOSIS — Z992 Dependence on renal dialysis: Secondary | ICD-10-CM | POA: Diagnosis not present

## 2021-01-07 DIAGNOSIS — D509 Iron deficiency anemia, unspecified: Secondary | ICD-10-CM | POA: Diagnosis not present

## 2021-01-07 DIAGNOSIS — D631 Anemia in chronic kidney disease: Secondary | ICD-10-CM | POA: Diagnosis not present

## 2021-01-07 DIAGNOSIS — R82998 Other abnormal findings in urine: Secondary | ICD-10-CM | POA: Diagnosis not present

## 2021-01-07 DIAGNOSIS — D513 Other dietary vitamin B12 deficiency anemia: Secondary | ICD-10-CM | POA: Diagnosis not present

## 2021-01-08 DIAGNOSIS — D509 Iron deficiency anemia, unspecified: Secondary | ICD-10-CM | POA: Diagnosis not present

## 2021-01-08 DIAGNOSIS — E876 Hypokalemia: Secondary | ICD-10-CM | POA: Diagnosis not present

## 2021-01-08 DIAGNOSIS — N2589 Other disorders resulting from impaired renal tubular function: Secondary | ICD-10-CM | POA: Diagnosis not present

## 2021-01-08 DIAGNOSIS — Z992 Dependence on renal dialysis: Secondary | ICD-10-CM | POA: Diagnosis not present

## 2021-01-08 DIAGNOSIS — N2581 Secondary hyperparathyroidism of renal origin: Secondary | ICD-10-CM | POA: Diagnosis not present

## 2021-01-08 DIAGNOSIS — Z4932 Encounter for adequacy testing for peritoneal dialysis: Secondary | ICD-10-CM | POA: Diagnosis not present

## 2021-01-08 DIAGNOSIS — D631 Anemia in chronic kidney disease: Secondary | ICD-10-CM | POA: Diagnosis not present

## 2021-01-08 DIAGNOSIS — N186 End stage renal disease: Secondary | ICD-10-CM | POA: Diagnosis not present

## 2021-01-08 DIAGNOSIS — R82998 Other abnormal findings in urine: Secondary | ICD-10-CM | POA: Diagnosis not present

## 2021-01-08 DIAGNOSIS — D513 Other dietary vitamin B12 deficiency anemia: Secondary | ICD-10-CM | POA: Diagnosis not present

## 2021-01-09 DIAGNOSIS — N186 End stage renal disease: Secondary | ICD-10-CM | POA: Diagnosis not present

## 2021-01-09 DIAGNOSIS — D513 Other dietary vitamin B12 deficiency anemia: Secondary | ICD-10-CM | POA: Diagnosis not present

## 2021-01-09 DIAGNOSIS — Z4932 Encounter for adequacy testing for peritoneal dialysis: Secondary | ICD-10-CM | POA: Diagnosis not present

## 2021-01-09 DIAGNOSIS — N2589 Other disorders resulting from impaired renal tubular function: Secondary | ICD-10-CM | POA: Diagnosis not present

## 2021-01-09 DIAGNOSIS — D631 Anemia in chronic kidney disease: Secondary | ICD-10-CM | POA: Diagnosis not present

## 2021-01-09 DIAGNOSIS — Z992 Dependence on renal dialysis: Secondary | ICD-10-CM | POA: Diagnosis not present

## 2021-01-09 DIAGNOSIS — E876 Hypokalemia: Secondary | ICD-10-CM | POA: Diagnosis not present

## 2021-01-09 DIAGNOSIS — R82998 Other abnormal findings in urine: Secondary | ICD-10-CM | POA: Diagnosis not present

## 2021-01-09 DIAGNOSIS — D509 Iron deficiency anemia, unspecified: Secondary | ICD-10-CM | POA: Diagnosis not present

## 2021-01-09 DIAGNOSIS — N2581 Secondary hyperparathyroidism of renal origin: Secondary | ICD-10-CM | POA: Diagnosis not present

## 2021-01-10 DIAGNOSIS — D509 Iron deficiency anemia, unspecified: Secondary | ICD-10-CM | POA: Diagnosis not present

## 2021-01-10 DIAGNOSIS — N2581 Secondary hyperparathyroidism of renal origin: Secondary | ICD-10-CM | POA: Diagnosis not present

## 2021-01-10 DIAGNOSIS — R82998 Other abnormal findings in urine: Secondary | ICD-10-CM | POA: Diagnosis not present

## 2021-01-10 DIAGNOSIS — N186 End stage renal disease: Secondary | ICD-10-CM | POA: Diagnosis not present

## 2021-01-10 DIAGNOSIS — Z4932 Encounter for adequacy testing for peritoneal dialysis: Secondary | ICD-10-CM | POA: Diagnosis not present

## 2021-01-10 DIAGNOSIS — D631 Anemia in chronic kidney disease: Secondary | ICD-10-CM | POA: Diagnosis not present

## 2021-01-10 DIAGNOSIS — D513 Other dietary vitamin B12 deficiency anemia: Secondary | ICD-10-CM | POA: Diagnosis not present

## 2021-01-10 DIAGNOSIS — Z992 Dependence on renal dialysis: Secondary | ICD-10-CM | POA: Diagnosis not present

## 2021-01-10 DIAGNOSIS — E876 Hypokalemia: Secondary | ICD-10-CM | POA: Diagnosis not present

## 2021-01-10 DIAGNOSIS — N2589 Other disorders resulting from impaired renal tubular function: Secondary | ICD-10-CM | POA: Diagnosis not present

## 2021-01-11 DIAGNOSIS — Z992 Dependence on renal dialysis: Secondary | ICD-10-CM | POA: Diagnosis not present

## 2021-01-11 DIAGNOSIS — R82998 Other abnormal findings in urine: Secondary | ICD-10-CM | POA: Diagnosis not present

## 2021-01-11 DIAGNOSIS — N186 End stage renal disease: Secondary | ICD-10-CM | POA: Diagnosis not present

## 2021-01-11 DIAGNOSIS — N2581 Secondary hyperparathyroidism of renal origin: Secondary | ICD-10-CM | POA: Diagnosis not present

## 2021-01-11 DIAGNOSIS — D509 Iron deficiency anemia, unspecified: Secondary | ICD-10-CM | POA: Diagnosis not present

## 2021-01-11 DIAGNOSIS — D513 Other dietary vitamin B12 deficiency anemia: Secondary | ICD-10-CM | POA: Diagnosis not present

## 2021-01-11 DIAGNOSIS — E876 Hypokalemia: Secondary | ICD-10-CM | POA: Diagnosis not present

## 2021-01-11 DIAGNOSIS — N2589 Other disorders resulting from impaired renal tubular function: Secondary | ICD-10-CM | POA: Diagnosis not present

## 2021-01-11 DIAGNOSIS — D631 Anemia in chronic kidney disease: Secondary | ICD-10-CM | POA: Diagnosis not present

## 2021-01-11 DIAGNOSIS — Z4932 Encounter for adequacy testing for peritoneal dialysis: Secondary | ICD-10-CM | POA: Diagnosis not present

## 2021-01-12 DIAGNOSIS — D513 Other dietary vitamin B12 deficiency anemia: Secondary | ICD-10-CM | POA: Diagnosis not present

## 2021-01-12 DIAGNOSIS — N2581 Secondary hyperparathyroidism of renal origin: Secondary | ICD-10-CM | POA: Diagnosis not present

## 2021-01-12 DIAGNOSIS — N186 End stage renal disease: Secondary | ICD-10-CM | POA: Diagnosis not present

## 2021-01-12 DIAGNOSIS — D509 Iron deficiency anemia, unspecified: Secondary | ICD-10-CM | POA: Diagnosis not present

## 2021-01-12 DIAGNOSIS — R82998 Other abnormal findings in urine: Secondary | ICD-10-CM | POA: Diagnosis not present

## 2021-01-12 DIAGNOSIS — Z992 Dependence on renal dialysis: Secondary | ICD-10-CM | POA: Diagnosis not present

## 2021-01-12 DIAGNOSIS — Z4932 Encounter for adequacy testing for peritoneal dialysis: Secondary | ICD-10-CM | POA: Diagnosis not present

## 2021-01-12 DIAGNOSIS — D631 Anemia in chronic kidney disease: Secondary | ICD-10-CM | POA: Diagnosis not present

## 2021-01-12 DIAGNOSIS — E876 Hypokalemia: Secondary | ICD-10-CM | POA: Diagnosis not present

## 2021-01-12 DIAGNOSIS — N2589 Other disorders resulting from impaired renal tubular function: Secondary | ICD-10-CM | POA: Diagnosis not present

## 2021-01-13 DIAGNOSIS — E876 Hypokalemia: Secondary | ICD-10-CM | POA: Diagnosis not present

## 2021-01-13 DIAGNOSIS — N2581 Secondary hyperparathyroidism of renal origin: Secondary | ICD-10-CM | POA: Diagnosis not present

## 2021-01-13 DIAGNOSIS — N2589 Other disorders resulting from impaired renal tubular function: Secondary | ICD-10-CM | POA: Diagnosis not present

## 2021-01-13 DIAGNOSIS — D513 Other dietary vitamin B12 deficiency anemia: Secondary | ICD-10-CM | POA: Diagnosis not present

## 2021-01-13 DIAGNOSIS — Z4932 Encounter for adequacy testing for peritoneal dialysis: Secondary | ICD-10-CM | POA: Diagnosis not present

## 2021-01-13 DIAGNOSIS — D509 Iron deficiency anemia, unspecified: Secondary | ICD-10-CM | POA: Diagnosis not present

## 2021-01-13 DIAGNOSIS — Z992 Dependence on renal dialysis: Secondary | ICD-10-CM | POA: Diagnosis not present

## 2021-01-13 DIAGNOSIS — R82998 Other abnormal findings in urine: Secondary | ICD-10-CM | POA: Diagnosis not present

## 2021-01-13 DIAGNOSIS — N186 End stage renal disease: Secondary | ICD-10-CM | POA: Diagnosis not present

## 2021-01-13 DIAGNOSIS — D631 Anemia in chronic kidney disease: Secondary | ICD-10-CM | POA: Diagnosis not present

## 2021-01-14 DIAGNOSIS — Z4932 Encounter for adequacy testing for peritoneal dialysis: Secondary | ICD-10-CM | POA: Diagnosis not present

## 2021-01-14 DIAGNOSIS — R82998 Other abnormal findings in urine: Secondary | ICD-10-CM | POA: Diagnosis not present

## 2021-01-14 DIAGNOSIS — Z992 Dependence on renal dialysis: Secondary | ICD-10-CM | POA: Diagnosis not present

## 2021-01-14 DIAGNOSIS — N2589 Other disorders resulting from impaired renal tubular function: Secondary | ICD-10-CM | POA: Diagnosis not present

## 2021-01-14 DIAGNOSIS — D509 Iron deficiency anemia, unspecified: Secondary | ICD-10-CM | POA: Diagnosis not present

## 2021-01-14 DIAGNOSIS — D513 Other dietary vitamin B12 deficiency anemia: Secondary | ICD-10-CM | POA: Diagnosis not present

## 2021-01-14 DIAGNOSIS — N2581 Secondary hyperparathyroidism of renal origin: Secondary | ICD-10-CM | POA: Diagnosis not present

## 2021-01-14 DIAGNOSIS — D631 Anemia in chronic kidney disease: Secondary | ICD-10-CM | POA: Diagnosis not present

## 2021-01-14 DIAGNOSIS — N186 End stage renal disease: Secondary | ICD-10-CM | POA: Diagnosis not present

## 2021-01-14 DIAGNOSIS — E876 Hypokalemia: Secondary | ICD-10-CM | POA: Diagnosis not present

## 2021-01-15 DIAGNOSIS — N186 End stage renal disease: Secondary | ICD-10-CM | POA: Diagnosis not present

## 2021-01-15 DIAGNOSIS — D513 Other dietary vitamin B12 deficiency anemia: Secondary | ICD-10-CM | POA: Diagnosis not present

## 2021-01-15 DIAGNOSIS — D509 Iron deficiency anemia, unspecified: Secondary | ICD-10-CM | POA: Diagnosis not present

## 2021-01-15 DIAGNOSIS — Z992 Dependence on renal dialysis: Secondary | ICD-10-CM | POA: Diagnosis not present

## 2021-01-15 DIAGNOSIS — D631 Anemia in chronic kidney disease: Secondary | ICD-10-CM | POA: Diagnosis not present

## 2021-01-15 DIAGNOSIS — R82998 Other abnormal findings in urine: Secondary | ICD-10-CM | POA: Diagnosis not present

## 2021-01-15 DIAGNOSIS — Z4932 Encounter for adequacy testing for peritoneal dialysis: Secondary | ICD-10-CM | POA: Diagnosis not present

## 2021-01-15 DIAGNOSIS — N2589 Other disorders resulting from impaired renal tubular function: Secondary | ICD-10-CM | POA: Diagnosis not present

## 2021-01-15 DIAGNOSIS — E876 Hypokalemia: Secondary | ICD-10-CM | POA: Diagnosis not present

## 2021-01-15 DIAGNOSIS — N2581 Secondary hyperparathyroidism of renal origin: Secondary | ICD-10-CM | POA: Diagnosis not present

## 2021-01-16 DIAGNOSIS — R82998 Other abnormal findings in urine: Secondary | ICD-10-CM | POA: Diagnosis not present

## 2021-01-16 DIAGNOSIS — D631 Anemia in chronic kidney disease: Secondary | ICD-10-CM | POA: Diagnosis not present

## 2021-01-16 DIAGNOSIS — D509 Iron deficiency anemia, unspecified: Secondary | ICD-10-CM | POA: Diagnosis not present

## 2021-01-16 DIAGNOSIS — E876 Hypokalemia: Secondary | ICD-10-CM | POA: Diagnosis not present

## 2021-01-16 DIAGNOSIS — N2581 Secondary hyperparathyroidism of renal origin: Secondary | ICD-10-CM | POA: Diagnosis not present

## 2021-01-16 DIAGNOSIS — Z992 Dependence on renal dialysis: Secondary | ICD-10-CM | POA: Diagnosis not present

## 2021-01-16 DIAGNOSIS — Z4932 Encounter for adequacy testing for peritoneal dialysis: Secondary | ICD-10-CM | POA: Diagnosis not present

## 2021-01-16 DIAGNOSIS — D513 Other dietary vitamin B12 deficiency anemia: Secondary | ICD-10-CM | POA: Diagnosis not present

## 2021-01-16 DIAGNOSIS — N186 End stage renal disease: Secondary | ICD-10-CM | POA: Diagnosis not present

## 2021-01-16 DIAGNOSIS — N2589 Other disorders resulting from impaired renal tubular function: Secondary | ICD-10-CM | POA: Diagnosis not present

## 2021-01-17 DIAGNOSIS — D631 Anemia in chronic kidney disease: Secondary | ICD-10-CM | POA: Diagnosis not present

## 2021-01-17 DIAGNOSIS — Z4932 Encounter for adequacy testing for peritoneal dialysis: Secondary | ICD-10-CM | POA: Diagnosis not present

## 2021-01-17 DIAGNOSIS — D513 Other dietary vitamin B12 deficiency anemia: Secondary | ICD-10-CM | POA: Diagnosis not present

## 2021-01-17 DIAGNOSIS — N2581 Secondary hyperparathyroidism of renal origin: Secondary | ICD-10-CM | POA: Diagnosis not present

## 2021-01-17 DIAGNOSIS — D509 Iron deficiency anemia, unspecified: Secondary | ICD-10-CM | POA: Diagnosis not present

## 2021-01-17 DIAGNOSIS — Z992 Dependence on renal dialysis: Secondary | ICD-10-CM | POA: Diagnosis not present

## 2021-01-17 DIAGNOSIS — E876 Hypokalemia: Secondary | ICD-10-CM | POA: Diagnosis not present

## 2021-01-17 DIAGNOSIS — N186 End stage renal disease: Secondary | ICD-10-CM | POA: Diagnosis not present

## 2021-01-17 DIAGNOSIS — R82998 Other abnormal findings in urine: Secondary | ICD-10-CM | POA: Diagnosis not present

## 2021-01-17 DIAGNOSIS — N2589 Other disorders resulting from impaired renal tubular function: Secondary | ICD-10-CM | POA: Diagnosis not present

## 2021-01-18 DIAGNOSIS — N2581 Secondary hyperparathyroidism of renal origin: Secondary | ICD-10-CM | POA: Diagnosis not present

## 2021-01-18 DIAGNOSIS — Z4932 Encounter for adequacy testing for peritoneal dialysis: Secondary | ICD-10-CM | POA: Diagnosis not present

## 2021-01-18 DIAGNOSIS — N2589 Other disorders resulting from impaired renal tubular function: Secondary | ICD-10-CM | POA: Diagnosis not present

## 2021-01-18 DIAGNOSIS — D631 Anemia in chronic kidney disease: Secondary | ICD-10-CM | POA: Diagnosis not present

## 2021-01-18 DIAGNOSIS — R82998 Other abnormal findings in urine: Secondary | ICD-10-CM | POA: Diagnosis not present

## 2021-01-18 DIAGNOSIS — D509 Iron deficiency anemia, unspecified: Secondary | ICD-10-CM | POA: Diagnosis not present

## 2021-01-18 DIAGNOSIS — D513 Other dietary vitamin B12 deficiency anemia: Secondary | ICD-10-CM | POA: Diagnosis not present

## 2021-01-18 DIAGNOSIS — Z992 Dependence on renal dialysis: Secondary | ICD-10-CM | POA: Diagnosis not present

## 2021-01-18 DIAGNOSIS — E876 Hypokalemia: Secondary | ICD-10-CM | POA: Diagnosis not present

## 2021-01-18 DIAGNOSIS — N186 End stage renal disease: Secondary | ICD-10-CM | POA: Diagnosis not present

## 2021-01-19 ENCOUNTER — Encounter: Payer: Self-pay | Admitting: Internal Medicine

## 2021-01-19 ENCOUNTER — Ambulatory Visit: Payer: Medicare Other | Admitting: Internal Medicine

## 2021-01-19 ENCOUNTER — Ambulatory Visit (INDEPENDENT_AMBULATORY_CARE_PROVIDER_SITE_OTHER): Payer: Medicare Other

## 2021-01-19 ENCOUNTER — Other Ambulatory Visit: Payer: Self-pay

## 2021-01-19 DIAGNOSIS — D509 Iron deficiency anemia, unspecified: Secondary | ICD-10-CM | POA: Diagnosis not present

## 2021-01-19 DIAGNOSIS — R82998 Other abnormal findings in urine: Secondary | ICD-10-CM | POA: Diagnosis not present

## 2021-01-19 DIAGNOSIS — R0600 Dyspnea, unspecified: Secondary | ICD-10-CM

## 2021-01-19 DIAGNOSIS — J449 Chronic obstructive pulmonary disease, unspecified: Secondary | ICD-10-CM | POA: Diagnosis not present

## 2021-01-19 DIAGNOSIS — I129 Hypertensive chronic kidney disease with stage 1 through stage 4 chronic kidney disease, or unspecified chronic kidney disease: Secondary | ICD-10-CM | POA: Diagnosis not present

## 2021-01-19 DIAGNOSIS — R0609 Other forms of dyspnea: Secondary | ICD-10-CM

## 2021-01-19 DIAGNOSIS — D631 Anemia in chronic kidney disease: Secondary | ICD-10-CM | POA: Diagnosis not present

## 2021-01-19 DIAGNOSIS — R059 Cough, unspecified: Secondary | ICD-10-CM | POA: Diagnosis not present

## 2021-01-19 DIAGNOSIS — E876 Hypokalemia: Secondary | ICD-10-CM | POA: Diagnosis not present

## 2021-01-19 DIAGNOSIS — D513 Other dietary vitamin B12 deficiency anemia: Secondary | ICD-10-CM | POA: Diagnosis not present

## 2021-01-19 DIAGNOSIS — N2581 Secondary hyperparathyroidism of renal origin: Secondary | ICD-10-CM | POA: Diagnosis not present

## 2021-01-19 DIAGNOSIS — Z4932 Encounter for adequacy testing for peritoneal dialysis: Secondary | ICD-10-CM | POA: Diagnosis not present

## 2021-01-19 DIAGNOSIS — J439 Emphysema, unspecified: Secondary | ICD-10-CM | POA: Diagnosis not present

## 2021-01-19 DIAGNOSIS — N186 End stage renal disease: Secondary | ICD-10-CM | POA: Diagnosis not present

## 2021-01-19 DIAGNOSIS — N2589 Other disorders resulting from impaired renal tubular function: Secondary | ICD-10-CM | POA: Diagnosis not present

## 2021-01-19 DIAGNOSIS — Z992 Dependence on renal dialysis: Secondary | ICD-10-CM | POA: Diagnosis not present

## 2021-01-19 LAB — SEDIMENTATION RATE: Sed Rate: 113 mm/hr — ABNORMAL HIGH (ref 0–20)

## 2021-01-19 LAB — CBC WITH DIFFERENTIAL/PLATELET
Basophils Absolute: 0 10*3/uL (ref 0.0–0.1)
Basophils Relative: 0.3 % (ref 0.0–3.0)
Eosinophils Absolute: 0 10*3/uL (ref 0.0–0.7)
Eosinophils Relative: 0.4 % (ref 0.0–5.0)
HCT: 31.9 % — ABNORMAL LOW (ref 39.0–52.0)
Hemoglobin: 10.7 g/dL — ABNORMAL LOW (ref 13.0–17.0)
Lymphocytes Relative: 7.5 % — ABNORMAL LOW (ref 12.0–46.0)
Lymphs Abs: 0.8 10*3/uL (ref 0.7–4.0)
MCHC: 33.5 g/dL (ref 30.0–36.0)
MCV: 101.4 fl — ABNORMAL HIGH (ref 78.0–100.0)
Monocytes Absolute: 1.3 10*3/uL — ABNORMAL HIGH (ref 0.1–1.0)
Monocytes Relative: 12.4 % — ABNORMAL HIGH (ref 3.0–12.0)
Neutro Abs: 8.6 10*3/uL — ABNORMAL HIGH (ref 1.4–7.7)
Neutrophils Relative %: 79.4 % — ABNORMAL HIGH (ref 43.0–77.0)
Platelets: 365 10*3/uL (ref 150.0–400.0)
RBC: 3.14 Mil/uL — ABNORMAL LOW (ref 4.22–5.81)
RDW: 15.3 % (ref 11.5–15.5)
WBC: 10.8 10*3/uL — ABNORMAL HIGH (ref 4.0–10.5)

## 2021-01-19 LAB — BRAIN NATRIURETIC PEPTIDE: Pro B Natriuretic peptide (BNP): 387 pg/mL — ABNORMAL HIGH (ref 0.0–100.0)

## 2021-01-19 LAB — TSH: TSH: 1.39 u[IU]/mL (ref 0.35–4.50)

## 2021-01-19 MED ORDER — PREDNISONE 10 MG PO TABS: ORAL_TABLET | ORAL | 0 refills | Status: DC

## 2021-01-19 MED ORDER — FAMOTIDINE 20 MG PO TABS: ORAL_TABLET | ORAL | 11 refills | Status: AC

## 2021-01-19 MED ORDER — PANTOPRAZOLE SODIUM 40 MG PO TBEC: 40.0000 mg | DELAYED_RELEASE_TABLET | Freq: Every day | ORAL | 2 refills | Status: AC

## 2021-01-19 NOTE — Patient Instructions (Addendum)
For cough / congestion >>>  mucinex dm 1200 mg every 12 hours as needed  (2 x 600 every 12 hours)  And use the flutter valve as much as possible   Make sure you check your oxygen saturation at your highest level of activity to be sure it stays over 90% and keep track of it at least once a week, more often if breathing getting worse, and let me know if losing ground.   Pantoprazole (protonix) 40 mg   Take  30-60 min before first meal of the day and Pepcid (famotidine)  20 mg one an hour before   bedtime until return to office - this is the best way to tell whether stomach acid is contributing to your problem.    GERD (REFLUX)  is an extremely common cause of respiratory symptoms just like yours , many times with no obvious heartburn at all.    It can be treated with medication, but also with lifestyle changes including elevation of the head of your bed (ideally with 6 -8inch blocks under the headboard of your bed),  Smoking cessation, avoidance of late meals, excessive alcohol, and avoid fatty foods, chocolate, peppermint, colas, red wine, and acidic juices such as orange juice.  NO MINT OR MENTHOL PRODUCTS SO NO COUGH DROPS  USE SUGARLESS CANDY INSTEAD (Jolley ranchers or Stover's or Life Savers) or even ice chips will also do - the key is to swallow to prevent all throat clearing. NO OIL BASED VITAMINS - use powdered substitutes.  Avoid fish oil when coughing.  Prednisone 10 mg take  4 each am x 2 days,   2 each am x 2 days,  1 each am x 2 days and stop   Please schedule a follow up office visit in 4 weeks, sooner if needed  with all medications /inhalers/ solutions in hand so we can verify exactly what you are taking. This includes all medications from all doctors and over the counters

## 2021-01-19 NOTE — Progress Notes (Signed)
David Barton, male    DOB: 1933/05/31    MRN: [id number]   Brief patient profile:  77  yowm  Quit smoking 1983  At wt 180 with baseline able to walk the dog 10 minutes slow pace but  avoided steps maint on spiriva with GOLD II COPD criteria 07/16/13     History of Present Illness  04/24/2019  Pulmonary/  office eval/David Barton / 02 4lpm 24/7 / persistent afib and started amiodarone since last ov Chief Complaint  Patient presents with  . Pulmonary Consult    Referred by Kerin Ransom, PA. Pt c/o SOB since March 2020. Pt c/o DOE with walking from room to room.  He has had some cough with clear to bloody sputum.     Dyspnea:  Gradually losing ground now Room to room gives out due to sob even on 4lpm  Cough: more bloody/ no pain with cough Sleep: on flat bed, 3 pillows on back SABA QVZ:[id number] helps the most  rec Stop spiriva and take the  duoneb up to 4 x in 24 hours as you feel you need it if it helps your shortness of breath For cough/congestion >  mucinex or mucinex dm up to 1200 mg every 12 hours and use flutter valve  as much as you can  Humidify 02 as much of the time as your can    06/27/2019  f/u ov/David Barton re:  S/p thoracentesis/ HD initiation on anoro and pulmocort Chief Complaint  Patient presents with  . Follow-up    Breathing is "ok" no new co's.   Dyspnea:  Room to room at home / on 3lpm  Cough: none Sleeping: flat in bed / two pillows  02: 3lpm 24/7  rec Plan A = Automatic = Trelegy one click - take two good drags  Plan B = Backup Only use your albuterol (proair resplick) as a rescue medication Plan C = Crisis - only use your albuterol nebulizer if you first try Plan B and it fails to help > ok to use the nebulizer up to every 4 hours but if start needing it regularly call for immediate appointment Stay on 3lpm at bedtime but ok to adjust during the day to keep your saturations over 90%     08/03/2019  f/u ov/David Barton re: f/u effusions / copd doing better on trelegy  Chief  Complaint  Patient presents with  . Follow-up    CXR repeated. Breathing has improved. He has not been using his albuterol inhaler or neb.   Dyspnea:  Says can walk "1000 yards" ( I believe he means feet)  off 02 but does not check level of  Treadmill x 5 min tired x 1.2 mph no grade at all, also does not check sats of 02  Cough: none  Sleeping: flat in bed, 2 pillows SABA use: none  02: 3lpm bedtime , not at all during the day rec The 02 saturation should at peak exercise once or twice a week with goal of keeping over 90%  Continue 3lpm at bedtime     11/05/2019  f/u ov/David Barton re: copd GOLD II on trelegy/  f/u bilateral effusions/ stopped 02 end of oct 2020 Chief Complaint  Patient presents with  . Follow-up    Breathing is doing well. He c/o occ fatigue.    Dyspnea:  teadmill x 49min RA x  1.2 mph 2 notches  Cough: no cough just some throat clearing, mild hoarseness on trelgy Sleeping: flat p sleeping pill  x one pillow x 2  SABA use: none  02: no longer using any 02 x 3 weeks  - was on 1pm when stopped  rec Have your wife check your oxygen level sleeping - we need to keep it around 90% or higher   07/01/20 NP ov  rec Stop Trelegy Start Stiolto Respimat- take 2 puffs once daily in the morning    09/17/2020  f/u ov/David Barton re:  Back on trelegy p breathing worse on stiolto  Chief Complaint  Patient presents with  . Follow-up  Dyspnea:  Not doing treadmill x one week due positional L back pain and both legs feel weak s numbness or radicular features to pain which radiates up to lower T spine but no pleuritic features, feels better supine with no assoc gi/gu symptoms Cough: no cough / but hoarse  Sleeping: flat in bed / one pillow / PD  SABA use: not using any 02: none  rec No change rx   televisit  12/30/20 For cough / congestion >>>  mucinex dm 1200 mg every 12 hours as needed  (2 x 600 every 12 hours)  Plan A = Automatic = Always=    Continue trelegy one click  Plan B =  Backup (to supplement plan A, not to replace it) Only use your albuterol inhaler as a rescue medication Also ok to try albuterol 15 min before an activity   01/19/2021  f/u ov/David Barton re:  GOLD II/ did not bring meds, easily confused with details of care. maint on trelegy but wrose x one month /sob  Chief Complaint  Patient presents with  . Follow-up    Sob-worse,cough-clear  Dyspnea:  Walking around the house sev times wears him out, has not tried saba first as rec  Cough: mucus  Sleeping: cough worse at hs at 30 degrees electric   SABA use: none  02: none    No obvious day to day or daytime variability or assoc   purulent sputum or mucus plugs or hemoptysis or cp or chest tightness, subjective wheeze or overt sinus or hb symptoms.    Also denies any obvious fluctuation of symptoms with weather or environmental changes or other aggravating or alleviating factors except as outlined above   No unusual exposure hx or h/o childhood pna/ asthma or knowledge of premature birth.  Current Allergies, Complete Past Medical History, Past Surgical History, Family History, and Social History were reviewed in [employer] record.  ROS  The following are not active complaints unless bolded Hoarseness, sore throat, dysphagia, dental problems, itching, sneezing,  nasal congestion or discharge of excess mucus or purulent secretions, ear ache,   fever, chills, sweats, unintended wt loss or wt gain, classically pleuritic or exertional cp,  orthopnea pnd or arm/hand swelling  or leg swelling, presyncope, palpitations, abdominal pain, anorexia, nausea, vomiting, diarrhea  or change in bowel habits or change in bladder habits, change in stools or change in urine, dysuria, hematuria,  rash, arthralgias, visual complaints, headache, numbness, weakness or ataxia or problems with walking or coordination,  change in mood or  memory.        Current Meds  Medication Sig  . ELIQUIS 2.5 MG TABS  tablet TAKE 1 TABLET(2.5 MG) BY MOUTH TWICE DAILY  . Fluticasone-Umeclidin-Vilant (TRELEGY ELLIPTA) 100-62.5-25 MCG/INH AEPB Inhale 1 puff into the lungs daily.  . metoprolol succinate (TOPROL XL) 25 MG 24 hr tablet Take 1 tablet (25 mg total) by mouth daily.  . multivitamin (RENA-VIT) TABS  tablet Take 1 tablet by mouth at bedtime.  . sevelamer carbonate (RENVELA) 800 MG tablet Take 1 tablet (800 mg total) by mouth 3 (three) times daily with meals.  Marland Kitchen zolpidem (AMBIEN) 10 MG tablet Take 10 mg by mouth at bedtime as needed.           Objective:    01/19/2021        183  09/17/2020        184  11/05/2019      190   08/03/2019       195   06/27/2019         208   04/24/19 231 lb (104.8 kg)  04/17/19 225 lb (102.1 kg)  03/26/19 226 lb 3.1 oz (102.6 kg)    Vital signs reviewed  01/19/2021  - Note at rest 02 sats  94% on  RA   General appearance:   amb wm with congested sounding rattling cough     HEENT : pt wearing mask not removed for exam due to covid - 19 concerns.    NECK :  without JVD/Nodes/TM/ nl carotid upstrokes bilaterally   LUNGS: no acc muscle use,  Mild barrel  contour chest wall with scatterd insp/ exp rhonchi bilaterally s audible wheeze and  without cough on insp or exp maneuvers  and mild  Hyperresonant  to  percussion bilaterally     CV:  RRR  no s3 or murmur or increase in P2, and no edema   ABD:  soft and nontender with pos end  insp Hoover's  in the supine position. No bruits or organomegaly appreciated, bowel sounds nl  MS:   Nl gait/  ext warm without deformities, calf tenderness, cyanosis or clubbing No obvious joint restrictions   SKIN: warm and dry without lesions    NEURO:  alert, approp, nl sensorium with  no motor or cerebellar deficits apparent.        CXR PA and Lateral:   01/19/2021 :    I personally reviewed images and agree with radiology impression as follows:   Changes of COPD with bibasilar atelectasis versus scarring, Unchanged. No acute  abnormalities.  Labs ordered/ reviewed:      Chemistry      Component Value Date/Time   NA 131 (L) 01/19/2021 1200   NA 139 12/21/2018 1045   K 3.8 01/19/2021 1200   CL 90 (L) 01/19/2021 1200   CO2 23 01/19/2021 1200   BUN 45 (H) 01/19/2021 1200   BUN 35 (H) 12/21/2018 1045   CREATININE 7.99 (HH) 01/19/2021 1200      Component Value Date/Time   CALCIUM 8.9 01/19/2021 1200   ALKPHOS 58 05/31/2019 0346   AST 14 (L) 05/31/2019 0346   ALT 12 05/31/2019 0346   BILITOT 0.5 05/31/2019 0346   BILITOT 0.5 12/21/2018 1045        Lab Results  Component Value Date   WBC 10.8 (H) 01/19/2021   HGB 10.7 (L) 01/19/2021   HCT 31.9 (L) 01/19/2021   MCV 101.4 (H) 01/19/2021   PLT 365.0 01/19/2021       EOS                                                               0.4  01/19/2021     Lab Results  Component Value Date   TSH 1.39 01/19/2021     Lab Results  Component Value Date   PROBNP 387.0 (H) 01/19/2021       Lab Results  Component Value Date   ESRSEDRATE 113 (H) 01/19/2021   ESRSEDRATE 84 (H) 05/19/2019   ESRSEDRATE 75 (H) 05/04/2019         Labs ordered 01/19/2021  :  allergy profile   alpha one AT phenotype      Assessment

## 2021-01-20 ENCOUNTER — Encounter: Payer: Self-pay | Admitting: Internal Medicine

## 2021-01-20 DIAGNOSIS — E440 Moderate protein-calorie malnutrition: Secondary | ICD-10-CM | POA: Diagnosis not present

## 2021-01-20 DIAGNOSIS — D509 Iron deficiency anemia, unspecified: Secondary | ICD-10-CM | POA: Diagnosis not present

## 2021-01-20 DIAGNOSIS — D631 Anemia in chronic kidney disease: Secondary | ICD-10-CM | POA: Diagnosis not present

## 2021-01-20 DIAGNOSIS — N186 End stage renal disease: Secondary | ICD-10-CM | POA: Diagnosis not present

## 2021-01-20 DIAGNOSIS — K769 Liver disease, unspecified: Secondary | ICD-10-CM | POA: Diagnosis not present

## 2021-01-20 DIAGNOSIS — Z992 Dependence on renal dialysis: Secondary | ICD-10-CM | POA: Diagnosis not present

## 2021-01-20 DIAGNOSIS — I480 Paroxysmal atrial fibrillation: Secondary | ICD-10-CM | POA: Diagnosis not present

## 2021-01-20 DIAGNOSIS — J449 Chronic obstructive pulmonary disease, unspecified: Secondary | ICD-10-CM | POA: Diagnosis not present

## 2021-01-20 DIAGNOSIS — R82998 Other abnormal findings in urine: Secondary | ICD-10-CM | POA: Diagnosis not present

## 2021-01-20 DIAGNOSIS — Z79899 Other long term (current) drug therapy: Secondary | ICD-10-CM | POA: Diagnosis not present

## 2021-01-20 DIAGNOSIS — N2581 Secondary hyperparathyroidism of renal origin: Secondary | ICD-10-CM | POA: Diagnosis not present

## 2021-01-20 LAB — BASIC METABOLIC PANEL
BUN: 45 mg/dL — ABNORMAL HIGH (ref 6–23)
CO2: 23 mEq/L (ref 19–32)
Calcium: 8.9 mg/dL (ref 8.4–10.5)
Chloride: 90 mEq/L — ABNORMAL LOW (ref 96–112)
Creatinine, Ser: 7.99 mg/dL (ref 0.40–1.50)
GFR: 5.68 mL/min — CL (ref >60.00)
Glucose, Bld: 90 mg/dL (ref 70–99)
Potassium: 3.8 mEq/L (ref 3.5–5.1)
Sodium: 131 mEq/L — ABNORMAL LOW (ref 135–145)

## 2021-01-20 NOTE — Assessment & Plan Note (Addendum)
  Lab Results  Component Value Date   CREATININE 7.99 (HH) 01/19/2021   CREATININE 3.30 (H) 10/19/2019   CREATININE 5.13 (H) 08/20/2019       Lab Results  Component Value Date   HGB 10.7 (L) 01/19/2021   HGB 13.6 10/19/2019   HGB 12.0 (L) 08/20/2019   HGB 14.1 12/21/2018   HGB 14.9 11/24/2009   HGB 15.1 05/28/2009   HGB 14.6 11/27/2008     He is anemic and creatinine over baseline but no PD which he is tolerating fine so nothing to do other than f/u with hematology as planned.             Each maintenance medication was reviewed in detail including emphasizing most importantly the difference between maintenance and prns and under what circumstances the prns are to be triggered using an action plan format where appropriate.  Total time for H and P, chart review, counseling, and generating customized AVS unique to this office visit / same day charting = 26 min

## 2021-01-20 NOTE — Assessment & Plan Note (Addendum)
Quit smoking 1983  - PFTs 07/16/2013  FEV1  1.75 (61%) ratio 52 and 15% better p B2, DLCO 74% - 06/27/2019  After extensive coaching inhaler device,  effectiveness =    90% with dpi so changed to trelegy maint and proair respiclick> improved as of ov 08/03/2019   DDX of  difficult airways management almost all start with A and  include Adherence, Ace Inhibitors, Acid Reflux, Active Sinus Disease, Alpha 1 Antitripsin deficiency, Anxiety masquerading as Airways dz,  ABPA,  Allergy(esp in young), Aspiration (esp in elderly), Adverse effects of meds,  Active smoking or vaping, A bunch of PE's (a small clot burden can't cause this syndrome unless there is already severe underlying pulm or vascular dz with poor reserve) plus two Bs  = Bronchiectasis and Beta blocker use..and one C= CHF  Adherence is always the initial "prime suspect" and is a multilayered concern that requires a "trust but verify" approach in every patient - starting with knowing how to use medications, especially inhalers, correctly, keeping up with refills and understanding the fundamental difference between maintenance and prns vs those medications only taken for a very short course and then stopped and not refilled.  - reviewed elipta/ hfa devices  ? Acid (or non-acid) GERD > always difficult to exclude as up to 75% of pts in some series report no assoc GI/ Heartburn symptoms> rec max (24h)  acid suppression and diet restrictions/ reviewed and instructions given in writing.  ? Alpha one AT def > send phenotype  ? Allergy/asthma component > continue trelegy >>> also so added 6 day taper off  Prednisone starting at 40 mg per day in case of component of Th-2 driven upper or lower airways inflammation (if cough responds short term only to relapse before return while will on full rx for uacs (as above), then  that would point to allergic rhinitis/ asthma or eos bronchitis as alternative dx)   ? chf > not apparent on cxr or clinically     Reviewed use of mucinex dm/ flutter for cough control   F/u in 4 weeks with all meds in hand using a trust but verify approach to confirm accurate Medication  Reconciliation The principal here is that until we are certain that the  patients are doing what we've asked, it makes no sense to ask them to do more.

## 2021-01-21 DIAGNOSIS — Z992 Dependence on renal dialysis: Secondary | ICD-10-CM | POA: Diagnosis not present

## 2021-01-21 DIAGNOSIS — N2581 Secondary hyperparathyroidism of renal origin: Secondary | ICD-10-CM | POA: Diagnosis not present

## 2021-01-21 DIAGNOSIS — K769 Liver disease, unspecified: Secondary | ICD-10-CM | POA: Diagnosis not present

## 2021-01-21 DIAGNOSIS — E440 Moderate protein-calorie malnutrition: Secondary | ICD-10-CM | POA: Diagnosis not present

## 2021-01-21 DIAGNOSIS — D631 Anemia in chronic kidney disease: Secondary | ICD-10-CM | POA: Diagnosis not present

## 2021-01-21 DIAGNOSIS — R82998 Other abnormal findings in urine: Secondary | ICD-10-CM | POA: Diagnosis not present

## 2021-01-21 DIAGNOSIS — N186 End stage renal disease: Secondary | ICD-10-CM | POA: Diagnosis not present

## 2021-01-21 DIAGNOSIS — Z79899 Other long term (current) drug therapy: Secondary | ICD-10-CM | POA: Diagnosis not present

## 2021-01-21 DIAGNOSIS — D509 Iron deficiency anemia, unspecified: Secondary | ICD-10-CM | POA: Diagnosis not present

## 2021-01-22 DIAGNOSIS — D631 Anemia in chronic kidney disease: Secondary | ICD-10-CM | POA: Diagnosis not present

## 2021-01-22 DIAGNOSIS — R82998 Other abnormal findings in urine: Secondary | ICD-10-CM | POA: Diagnosis not present

## 2021-01-22 DIAGNOSIS — N186 End stage renal disease: Secondary | ICD-10-CM | POA: Diagnosis not present

## 2021-01-22 DIAGNOSIS — D509 Iron deficiency anemia, unspecified: Secondary | ICD-10-CM | POA: Diagnosis not present

## 2021-01-22 DIAGNOSIS — K769 Liver disease, unspecified: Secondary | ICD-10-CM | POA: Diagnosis not present

## 2021-01-22 DIAGNOSIS — Z79899 Other long term (current) drug therapy: Secondary | ICD-10-CM | POA: Diagnosis not present

## 2021-01-22 DIAGNOSIS — N2581 Secondary hyperparathyroidism of renal origin: Secondary | ICD-10-CM | POA: Diagnosis not present

## 2021-01-22 DIAGNOSIS — E440 Moderate protein-calorie malnutrition: Secondary | ICD-10-CM | POA: Diagnosis not present

## 2021-01-22 DIAGNOSIS — Z992 Dependence on renal dialysis: Secondary | ICD-10-CM | POA: Diagnosis not present

## 2021-01-23 DIAGNOSIS — Z79899 Other long term (current) drug therapy: Secondary | ICD-10-CM | POA: Diagnosis not present

## 2021-01-23 DIAGNOSIS — K769 Liver disease, unspecified: Secondary | ICD-10-CM | POA: Diagnosis not present

## 2021-01-23 DIAGNOSIS — E440 Moderate protein-calorie malnutrition: Secondary | ICD-10-CM | POA: Diagnosis not present

## 2021-01-23 DIAGNOSIS — D509 Iron deficiency anemia, unspecified: Secondary | ICD-10-CM | POA: Diagnosis not present

## 2021-01-23 DIAGNOSIS — D631 Anemia in chronic kidney disease: Secondary | ICD-10-CM | POA: Diagnosis not present

## 2021-01-23 DIAGNOSIS — R82998 Other abnormal findings in urine: Secondary | ICD-10-CM | POA: Diagnosis not present

## 2021-01-23 DIAGNOSIS — N186 End stage renal disease: Secondary | ICD-10-CM | POA: Diagnosis not present

## 2021-01-23 DIAGNOSIS — N2581 Secondary hyperparathyroidism of renal origin: Secondary | ICD-10-CM | POA: Diagnosis not present

## 2021-01-23 DIAGNOSIS — Z992 Dependence on renal dialysis: Secondary | ICD-10-CM | POA: Diagnosis not present

## 2021-01-24 DIAGNOSIS — E440 Moderate protein-calorie malnutrition: Secondary | ICD-10-CM | POA: Diagnosis not present

## 2021-01-24 DIAGNOSIS — R82998 Other abnormal findings in urine: Secondary | ICD-10-CM | POA: Diagnosis not present

## 2021-01-24 DIAGNOSIS — D509 Iron deficiency anemia, unspecified: Secondary | ICD-10-CM | POA: Diagnosis not present

## 2021-01-24 DIAGNOSIS — Z992 Dependence on renal dialysis: Secondary | ICD-10-CM | POA: Diagnosis not present

## 2021-01-24 DIAGNOSIS — N186 End stage renal disease: Secondary | ICD-10-CM | POA: Diagnosis not present

## 2021-01-24 DIAGNOSIS — Z79899 Other long term (current) drug therapy: Secondary | ICD-10-CM | POA: Diagnosis not present

## 2021-01-24 DIAGNOSIS — D631 Anemia in chronic kidney disease: Secondary | ICD-10-CM | POA: Diagnosis not present

## 2021-01-24 DIAGNOSIS — N2581 Secondary hyperparathyroidism of renal origin: Secondary | ICD-10-CM | POA: Diagnosis not present

## 2021-01-24 DIAGNOSIS — K769 Liver disease, unspecified: Secondary | ICD-10-CM | POA: Diagnosis not present

## 2021-01-25 DIAGNOSIS — D631 Anemia in chronic kidney disease: Secondary | ICD-10-CM | POA: Diagnosis not present

## 2021-01-25 DIAGNOSIS — E440 Moderate protein-calorie malnutrition: Secondary | ICD-10-CM | POA: Diagnosis not present

## 2021-01-25 DIAGNOSIS — R82998 Other abnormal findings in urine: Secondary | ICD-10-CM | POA: Diagnosis not present

## 2021-01-25 DIAGNOSIS — D509 Iron deficiency anemia, unspecified: Secondary | ICD-10-CM | POA: Diagnosis not present

## 2021-01-25 DIAGNOSIS — Z79899 Other long term (current) drug therapy: Secondary | ICD-10-CM | POA: Diagnosis not present

## 2021-01-25 DIAGNOSIS — Z992 Dependence on renal dialysis: Secondary | ICD-10-CM | POA: Diagnosis not present

## 2021-01-25 DIAGNOSIS — N186 End stage renal disease: Secondary | ICD-10-CM | POA: Diagnosis not present

## 2021-01-25 DIAGNOSIS — K769 Liver disease, unspecified: Secondary | ICD-10-CM | POA: Diagnosis not present

## 2021-01-25 DIAGNOSIS — N2581 Secondary hyperparathyroidism of renal origin: Secondary | ICD-10-CM | POA: Diagnosis not present

## 2021-01-26 DIAGNOSIS — K769 Liver disease, unspecified: Secondary | ICD-10-CM | POA: Diagnosis not present

## 2021-01-26 DIAGNOSIS — N2581 Secondary hyperparathyroidism of renal origin: Secondary | ICD-10-CM | POA: Diagnosis not present

## 2021-01-26 DIAGNOSIS — Z79899 Other long term (current) drug therapy: Secondary | ICD-10-CM | POA: Diagnosis not present

## 2021-01-26 DIAGNOSIS — D509 Iron deficiency anemia, unspecified: Secondary | ICD-10-CM | POA: Diagnosis not present

## 2021-01-26 DIAGNOSIS — R82998 Other abnormal findings in urine: Secondary | ICD-10-CM | POA: Diagnosis not present

## 2021-01-26 DIAGNOSIS — N186 End stage renal disease: Secondary | ICD-10-CM | POA: Diagnosis not present

## 2021-01-26 DIAGNOSIS — E440 Moderate protein-calorie malnutrition: Secondary | ICD-10-CM | POA: Diagnosis not present

## 2021-01-26 DIAGNOSIS — Z992 Dependence on renal dialysis: Secondary | ICD-10-CM | POA: Diagnosis not present

## 2021-01-26 DIAGNOSIS — D631 Anemia in chronic kidney disease: Secondary | ICD-10-CM | POA: Diagnosis not present

## 2021-01-27 DIAGNOSIS — N2581 Secondary hyperparathyroidism of renal origin: Secondary | ICD-10-CM | POA: Diagnosis not present

## 2021-01-27 DIAGNOSIS — Z992 Dependence on renal dialysis: Secondary | ICD-10-CM | POA: Diagnosis not present

## 2021-01-27 DIAGNOSIS — R82998 Other abnormal findings in urine: Secondary | ICD-10-CM | POA: Diagnosis not present

## 2021-01-27 DIAGNOSIS — N186 End stage renal disease: Secondary | ICD-10-CM | POA: Diagnosis not present

## 2021-01-27 DIAGNOSIS — D509 Iron deficiency anemia, unspecified: Secondary | ICD-10-CM | POA: Diagnosis not present

## 2021-01-27 DIAGNOSIS — Z79899 Other long term (current) drug therapy: Secondary | ICD-10-CM | POA: Diagnosis not present

## 2021-01-27 DIAGNOSIS — E440 Moderate protein-calorie malnutrition: Secondary | ICD-10-CM | POA: Diagnosis not present

## 2021-01-27 DIAGNOSIS — K769 Liver disease, unspecified: Secondary | ICD-10-CM | POA: Diagnosis not present

## 2021-01-27 DIAGNOSIS — D631 Anemia in chronic kidney disease: Secondary | ICD-10-CM | POA: Diagnosis not present

## 2021-01-28 DIAGNOSIS — Z79899 Other long term (current) drug therapy: Secondary | ICD-10-CM | POA: Diagnosis not present

## 2021-01-28 DIAGNOSIS — D631 Anemia in chronic kidney disease: Secondary | ICD-10-CM | POA: Diagnosis not present

## 2021-01-28 DIAGNOSIS — D509 Iron deficiency anemia, unspecified: Secondary | ICD-10-CM | POA: Diagnosis not present

## 2021-01-28 DIAGNOSIS — E440 Moderate protein-calorie malnutrition: Secondary | ICD-10-CM | POA: Diagnosis not present

## 2021-01-28 DIAGNOSIS — Z992 Dependence on renal dialysis: Secondary | ICD-10-CM | POA: Diagnosis not present

## 2021-01-28 DIAGNOSIS — R82998 Other abnormal findings in urine: Secondary | ICD-10-CM | POA: Diagnosis not present

## 2021-01-28 DIAGNOSIS — N186 End stage renal disease: Secondary | ICD-10-CM | POA: Diagnosis not present

## 2021-01-28 DIAGNOSIS — N2581 Secondary hyperparathyroidism of renal origin: Secondary | ICD-10-CM | POA: Diagnosis not present

## 2021-01-28 DIAGNOSIS — K769 Liver disease, unspecified: Secondary | ICD-10-CM | POA: Diagnosis not present

## 2021-01-29 ENCOUNTER — Telehealth: Payer: Self-pay | Admitting: Internal Medicine

## 2021-01-29 DIAGNOSIS — R82998 Other abnormal findings in urine: Secondary | ICD-10-CM | POA: Diagnosis not present

## 2021-01-29 DIAGNOSIS — N2581 Secondary hyperparathyroidism of renal origin: Secondary | ICD-10-CM | POA: Diagnosis not present

## 2021-01-29 DIAGNOSIS — D631 Anemia in chronic kidney disease: Secondary | ICD-10-CM | POA: Diagnosis not present

## 2021-01-29 DIAGNOSIS — N186 End stage renal disease: Secondary | ICD-10-CM | POA: Diagnosis not present

## 2021-01-29 DIAGNOSIS — K769 Liver disease, unspecified: Secondary | ICD-10-CM | POA: Diagnosis not present

## 2021-01-29 DIAGNOSIS — Z79899 Other long term (current) drug therapy: Secondary | ICD-10-CM | POA: Diagnosis not present

## 2021-01-29 DIAGNOSIS — Z992 Dependence on renal dialysis: Secondary | ICD-10-CM | POA: Diagnosis not present

## 2021-01-29 DIAGNOSIS — E440 Moderate protein-calorie malnutrition: Secondary | ICD-10-CM | POA: Diagnosis not present

## 2021-01-29 DIAGNOSIS — D509 Iron deficiency anemia, unspecified: Secondary | ICD-10-CM | POA: Diagnosis not present

## 2021-01-29 LAB — ALPHA-1 ANTITRYPSIN PHENOTYPE: A-1 Antitrypsin, Ser: 279 mg/dL — ABNORMAL HIGH (ref 83–199)

## 2021-01-29 LAB — IGE: IgE (Immunoglobulin E), Serum: 10 kU/L (ref <=114)

## 2021-01-29 NOTE — Telephone Encounter (Signed)
Call returned to patient, confirmed DOB. Confirmed with patient call was referring to his labs. Aware of results. Nothing further needed at this time.

## 2021-01-30 DIAGNOSIS — K769 Liver disease, unspecified: Secondary | ICD-10-CM | POA: Diagnosis not present

## 2021-01-30 DIAGNOSIS — E440 Moderate protein-calorie malnutrition: Secondary | ICD-10-CM | POA: Diagnosis not present

## 2021-01-30 DIAGNOSIS — N2581 Secondary hyperparathyroidism of renal origin: Secondary | ICD-10-CM | POA: Diagnosis not present

## 2021-01-30 DIAGNOSIS — D509 Iron deficiency anemia, unspecified: Secondary | ICD-10-CM | POA: Diagnosis not present

## 2021-01-30 DIAGNOSIS — N186 End stage renal disease: Secondary | ICD-10-CM | POA: Diagnosis not present

## 2021-01-30 DIAGNOSIS — R82998 Other abnormal findings in urine: Secondary | ICD-10-CM | POA: Diagnosis not present

## 2021-01-30 DIAGNOSIS — Z79899 Other long term (current) drug therapy: Secondary | ICD-10-CM | POA: Diagnosis not present

## 2021-01-30 DIAGNOSIS — Z992 Dependence on renal dialysis: Secondary | ICD-10-CM | POA: Diagnosis not present

## 2021-01-30 DIAGNOSIS — D631 Anemia in chronic kidney disease: Secondary | ICD-10-CM | POA: Diagnosis not present

## 2021-01-31 DIAGNOSIS — R82998 Other abnormal findings in urine: Secondary | ICD-10-CM | POA: Diagnosis not present

## 2021-01-31 DIAGNOSIS — N186 End stage renal disease: Secondary | ICD-10-CM | POA: Diagnosis not present

## 2021-01-31 DIAGNOSIS — Z79899 Other long term (current) drug therapy: Secondary | ICD-10-CM | POA: Diagnosis not present

## 2021-01-31 DIAGNOSIS — N2581 Secondary hyperparathyroidism of renal origin: Secondary | ICD-10-CM | POA: Diagnosis not present

## 2021-01-31 DIAGNOSIS — D631 Anemia in chronic kidney disease: Secondary | ICD-10-CM | POA: Diagnosis not present

## 2021-01-31 DIAGNOSIS — D509 Iron deficiency anemia, unspecified: Secondary | ICD-10-CM | POA: Diagnosis not present

## 2021-01-31 DIAGNOSIS — K769 Liver disease, unspecified: Secondary | ICD-10-CM | POA: Diagnosis not present

## 2021-01-31 DIAGNOSIS — E440 Moderate protein-calorie malnutrition: Secondary | ICD-10-CM | POA: Diagnosis not present

## 2021-01-31 DIAGNOSIS — Z992 Dependence on renal dialysis: Secondary | ICD-10-CM | POA: Diagnosis not present

## 2021-02-01 DIAGNOSIS — R82998 Other abnormal findings in urine: Secondary | ICD-10-CM | POA: Diagnosis not present

## 2021-02-01 DIAGNOSIS — Z992 Dependence on renal dialysis: Secondary | ICD-10-CM | POA: Diagnosis not present

## 2021-02-01 DIAGNOSIS — Z79899 Other long term (current) drug therapy: Secondary | ICD-10-CM | POA: Diagnosis not present

## 2021-02-01 DIAGNOSIS — K769 Liver disease, unspecified: Secondary | ICD-10-CM | POA: Diagnosis not present

## 2021-02-01 DIAGNOSIS — E440 Moderate protein-calorie malnutrition: Secondary | ICD-10-CM | POA: Diagnosis not present

## 2021-02-01 DIAGNOSIS — D509 Iron deficiency anemia, unspecified: Secondary | ICD-10-CM | POA: Diagnosis not present

## 2021-02-01 DIAGNOSIS — D631 Anemia in chronic kidney disease: Secondary | ICD-10-CM | POA: Diagnosis not present

## 2021-02-01 DIAGNOSIS — N2581 Secondary hyperparathyroidism of renal origin: Secondary | ICD-10-CM | POA: Diagnosis not present

## 2021-02-01 DIAGNOSIS — N186 End stage renal disease: Secondary | ICD-10-CM | POA: Diagnosis not present

## 2021-02-02 DIAGNOSIS — D631 Anemia in chronic kidney disease: Secondary | ICD-10-CM | POA: Diagnosis not present

## 2021-02-02 DIAGNOSIS — N186 End stage renal disease: Secondary | ICD-10-CM | POA: Diagnosis not present

## 2021-02-02 DIAGNOSIS — D509 Iron deficiency anemia, unspecified: Secondary | ICD-10-CM | POA: Diagnosis not present

## 2021-02-02 DIAGNOSIS — H9190 Unspecified hearing loss, unspecified ear: Secondary | ICD-10-CM | POA: Diagnosis not present

## 2021-02-02 DIAGNOSIS — N2581 Secondary hyperparathyroidism of renal origin: Secondary | ICD-10-CM | POA: Diagnosis not present

## 2021-02-02 DIAGNOSIS — R82998 Other abnormal findings in urine: Secondary | ICD-10-CM | POA: Diagnosis not present

## 2021-02-02 DIAGNOSIS — Z79899 Other long term (current) drug therapy: Secondary | ICD-10-CM | POA: Diagnosis not present

## 2021-02-02 DIAGNOSIS — K1120 Sialoadenitis, unspecified: Secondary | ICD-10-CM | POA: Diagnosis not present

## 2021-02-02 DIAGNOSIS — I480 Paroxysmal atrial fibrillation: Secondary | ICD-10-CM | POA: Diagnosis not present

## 2021-02-02 DIAGNOSIS — K769 Liver disease, unspecified: Secondary | ICD-10-CM | POA: Diagnosis not present

## 2021-02-02 DIAGNOSIS — E440 Moderate protein-calorie malnutrition: Secondary | ICD-10-CM | POA: Diagnosis not present

## 2021-02-02 DIAGNOSIS — Z992 Dependence on renal dialysis: Secondary | ICD-10-CM | POA: Diagnosis not present

## 2021-02-03 DIAGNOSIS — D631 Anemia in chronic kidney disease: Secondary | ICD-10-CM | POA: Diagnosis not present

## 2021-02-03 DIAGNOSIS — E440 Moderate protein-calorie malnutrition: Secondary | ICD-10-CM | POA: Diagnosis not present

## 2021-02-03 DIAGNOSIS — N186 End stage renal disease: Secondary | ICD-10-CM | POA: Diagnosis not present

## 2021-02-03 DIAGNOSIS — R82998 Other abnormal findings in urine: Secondary | ICD-10-CM | POA: Diagnosis not present

## 2021-02-03 DIAGNOSIS — Z79899 Other long term (current) drug therapy: Secondary | ICD-10-CM | POA: Diagnosis not present

## 2021-02-03 DIAGNOSIS — N2581 Secondary hyperparathyroidism of renal origin: Secondary | ICD-10-CM | POA: Diagnosis not present

## 2021-02-03 DIAGNOSIS — Z992 Dependence on renal dialysis: Secondary | ICD-10-CM | POA: Diagnosis not present

## 2021-02-03 DIAGNOSIS — K769 Liver disease, unspecified: Secondary | ICD-10-CM | POA: Diagnosis not present

## 2021-02-03 DIAGNOSIS — D509 Iron deficiency anemia, unspecified: Secondary | ICD-10-CM | POA: Diagnosis not present

## 2021-02-04 DIAGNOSIS — R82998 Other abnormal findings in urine: Secondary | ICD-10-CM | POA: Diagnosis not present

## 2021-02-04 DIAGNOSIS — E440 Moderate protein-calorie malnutrition: Secondary | ICD-10-CM | POA: Diagnosis not present

## 2021-02-04 DIAGNOSIS — Z992 Dependence on renal dialysis: Secondary | ICD-10-CM | POA: Diagnosis not present

## 2021-02-04 DIAGNOSIS — K769 Liver disease, unspecified: Secondary | ICD-10-CM | POA: Diagnosis not present

## 2021-02-04 DIAGNOSIS — D509 Iron deficiency anemia, unspecified: Secondary | ICD-10-CM | POA: Diagnosis not present

## 2021-02-04 DIAGNOSIS — D631 Anemia in chronic kidney disease: Secondary | ICD-10-CM | POA: Diagnosis not present

## 2021-02-04 DIAGNOSIS — N2581 Secondary hyperparathyroidism of renal origin: Secondary | ICD-10-CM | POA: Diagnosis not present

## 2021-02-04 DIAGNOSIS — K1120 Sialoadenitis, unspecified: Secondary | ICD-10-CM | POA: Diagnosis not present

## 2021-02-04 DIAGNOSIS — N186 End stage renal disease: Secondary | ICD-10-CM | POA: Diagnosis not present

## 2021-02-04 DIAGNOSIS — Z79899 Other long term (current) drug therapy: Secondary | ICD-10-CM | POA: Diagnosis not present

## 2021-02-05 DIAGNOSIS — K769 Liver disease, unspecified: Secondary | ICD-10-CM | POA: Diagnosis not present

## 2021-02-05 DIAGNOSIS — Z79899 Other long term (current) drug therapy: Secondary | ICD-10-CM | POA: Diagnosis not present

## 2021-02-05 DIAGNOSIS — D631 Anemia in chronic kidney disease: Secondary | ICD-10-CM | POA: Diagnosis not present

## 2021-02-05 DIAGNOSIS — D509 Iron deficiency anemia, unspecified: Secondary | ICD-10-CM | POA: Diagnosis not present

## 2021-02-05 DIAGNOSIS — N186 End stage renal disease: Secondary | ICD-10-CM | POA: Diagnosis not present

## 2021-02-05 DIAGNOSIS — Z9621 Cochlear implant status: Secondary | ICD-10-CM | POA: Diagnosis not present

## 2021-02-05 DIAGNOSIS — Z992 Dependence on renal dialysis: Secondary | ICD-10-CM | POA: Diagnosis not present

## 2021-02-05 DIAGNOSIS — H6123 Impacted cerumen, bilateral: Secondary | ICD-10-CM | POA: Diagnosis not present

## 2021-02-05 DIAGNOSIS — Z9889 Other specified postprocedural states: Secondary | ICD-10-CM | POA: Diagnosis not present

## 2021-02-05 DIAGNOSIS — E440 Moderate protein-calorie malnutrition: Secondary | ICD-10-CM | POA: Diagnosis not present

## 2021-02-05 DIAGNOSIS — H6042 Cholesteatoma of left external ear: Secondary | ICD-10-CM | POA: Diagnosis not present

## 2021-02-05 DIAGNOSIS — H6122 Impacted cerumen, left ear: Secondary | ICD-10-CM | POA: Diagnosis not present

## 2021-02-05 DIAGNOSIS — R82998 Other abnormal findings in urine: Secondary | ICD-10-CM | POA: Diagnosis not present

## 2021-02-05 DIAGNOSIS — N2581 Secondary hyperparathyroidism of renal origin: Secondary | ICD-10-CM | POA: Diagnosis not present

## 2021-02-06 ENCOUNTER — Other Ambulatory Visit: Payer: Self-pay | Admitting: Nephrology

## 2021-02-06 ENCOUNTER — Ambulatory Visit
Admission: RE | Admit: 2021-02-06 | Discharge: 2021-02-06 | Disposition: A | Discharge status: Home / Self Care | Payer: Medicare Other | Source: Ambulatory Visit | Attending: Nephrology | Admitting: Nephrology

## 2021-02-06 DIAGNOSIS — M5136 Other intervertebral disc degeneration, lumbar region: Secondary | ICD-10-CM | POA: Diagnosis not present

## 2021-02-06 DIAGNOSIS — Z992 Dependence on renal dialysis: Secondary | ICD-10-CM | POA: Diagnosis not present

## 2021-02-06 DIAGNOSIS — N186 End stage renal disease: Secondary | ICD-10-CM

## 2021-02-06 DIAGNOSIS — Z452 Encounter for adjustment and management of vascular access device: Secondary | ICD-10-CM | POA: Diagnosis not present

## 2021-02-06 DIAGNOSIS — K769 Liver disease, unspecified: Secondary | ICD-10-CM | POA: Diagnosis not present

## 2021-02-06 DIAGNOSIS — D631 Anemia in chronic kidney disease: Secondary | ICD-10-CM | POA: Diagnosis not present

## 2021-02-06 DIAGNOSIS — N2581 Secondary hyperparathyroidism of renal origin: Secondary | ICD-10-CM | POA: Diagnosis not present

## 2021-02-06 DIAGNOSIS — Z79899 Other long term (current) drug therapy: Secondary | ICD-10-CM | POA: Diagnosis not present

## 2021-02-06 DIAGNOSIS — E440 Moderate protein-calorie malnutrition: Secondary | ICD-10-CM | POA: Diagnosis not present

## 2021-02-06 DIAGNOSIS — D509 Iron deficiency anemia, unspecified: Secondary | ICD-10-CM | POA: Diagnosis not present

## 2021-02-06 DIAGNOSIS — R82998 Other abnormal findings in urine: Secondary | ICD-10-CM | POA: Diagnosis not present

## 2021-02-07 DIAGNOSIS — N2581 Secondary hyperparathyroidism of renal origin: Secondary | ICD-10-CM | POA: Diagnosis not present

## 2021-02-07 DIAGNOSIS — Z79899 Other long term (current) drug therapy: Secondary | ICD-10-CM | POA: Diagnosis not present

## 2021-02-07 DIAGNOSIS — E440 Moderate protein-calorie malnutrition: Secondary | ICD-10-CM | POA: Diagnosis not present

## 2021-02-07 DIAGNOSIS — K769 Liver disease, unspecified: Secondary | ICD-10-CM | POA: Diagnosis not present

## 2021-02-07 DIAGNOSIS — D631 Anemia in chronic kidney disease: Secondary | ICD-10-CM | POA: Diagnosis not present

## 2021-02-07 DIAGNOSIS — N186 End stage renal disease: Secondary | ICD-10-CM | POA: Diagnosis not present

## 2021-02-07 DIAGNOSIS — D509 Iron deficiency anemia, unspecified: Secondary | ICD-10-CM | POA: Diagnosis not present

## 2021-02-07 DIAGNOSIS — R82998 Other abnormal findings in urine: Secondary | ICD-10-CM | POA: Diagnosis not present

## 2021-02-07 DIAGNOSIS — Z992 Dependence on renal dialysis: Secondary | ICD-10-CM | POA: Diagnosis not present

## 2021-02-08 DIAGNOSIS — E440 Moderate protein-calorie malnutrition: Secondary | ICD-10-CM | POA: Diagnosis not present

## 2021-02-08 DIAGNOSIS — D509 Iron deficiency anemia, unspecified: Secondary | ICD-10-CM | POA: Diagnosis not present

## 2021-02-08 DIAGNOSIS — K769 Liver disease, unspecified: Secondary | ICD-10-CM | POA: Diagnosis not present

## 2021-02-08 DIAGNOSIS — N2581 Secondary hyperparathyroidism of renal origin: Secondary | ICD-10-CM | POA: Diagnosis not present

## 2021-02-08 DIAGNOSIS — D631 Anemia in chronic kidney disease: Secondary | ICD-10-CM | POA: Diagnosis not present

## 2021-02-08 DIAGNOSIS — Z79899 Other long term (current) drug therapy: Secondary | ICD-10-CM | POA: Diagnosis not present

## 2021-02-08 DIAGNOSIS — R82998 Other abnormal findings in urine: Secondary | ICD-10-CM | POA: Diagnosis not present

## 2021-02-08 DIAGNOSIS — N186 End stage renal disease: Secondary | ICD-10-CM | POA: Diagnosis not present

## 2021-02-08 DIAGNOSIS — Z992 Dependence on renal dialysis: Secondary | ICD-10-CM | POA: Diagnosis not present

## 2021-02-09 DIAGNOSIS — R82998 Other abnormal findings in urine: Secondary | ICD-10-CM | POA: Diagnosis not present

## 2021-02-09 DIAGNOSIS — K769 Liver disease, unspecified: Secondary | ICD-10-CM | POA: Diagnosis not present

## 2021-02-09 DIAGNOSIS — N2581 Secondary hyperparathyroidism of renal origin: Secondary | ICD-10-CM | POA: Diagnosis not present

## 2021-02-09 DIAGNOSIS — Z79899 Other long term (current) drug therapy: Secondary | ICD-10-CM | POA: Diagnosis not present

## 2021-02-09 DIAGNOSIS — D509 Iron deficiency anemia, unspecified: Secondary | ICD-10-CM | POA: Diagnosis not present

## 2021-02-09 DIAGNOSIS — Z992 Dependence on renal dialysis: Secondary | ICD-10-CM | POA: Diagnosis not present

## 2021-02-09 DIAGNOSIS — D631 Anemia in chronic kidney disease: Secondary | ICD-10-CM | POA: Diagnosis not present

## 2021-02-09 DIAGNOSIS — N186 End stage renal disease: Secondary | ICD-10-CM | POA: Diagnosis not present

## 2021-02-09 DIAGNOSIS — E440 Moderate protein-calorie malnutrition: Secondary | ICD-10-CM | POA: Diagnosis not present

## 2021-02-10 DIAGNOSIS — R82998 Other abnormal findings in urine: Secondary | ICD-10-CM | POA: Diagnosis not present

## 2021-02-10 DIAGNOSIS — N186 End stage renal disease: Secondary | ICD-10-CM | POA: Diagnosis not present

## 2021-02-10 DIAGNOSIS — K769 Liver disease, unspecified: Secondary | ICD-10-CM | POA: Diagnosis not present

## 2021-02-10 DIAGNOSIS — N2581 Secondary hyperparathyroidism of renal origin: Secondary | ICD-10-CM | POA: Diagnosis not present

## 2021-02-10 DIAGNOSIS — Z79899 Other long term (current) drug therapy: Secondary | ICD-10-CM | POA: Diagnosis not present

## 2021-02-10 DIAGNOSIS — Z992 Dependence on renal dialysis: Secondary | ICD-10-CM | POA: Diagnosis not present

## 2021-02-10 DIAGNOSIS — E440 Moderate protein-calorie malnutrition: Secondary | ICD-10-CM | POA: Diagnosis not present

## 2021-02-10 DIAGNOSIS — D631 Anemia in chronic kidney disease: Secondary | ICD-10-CM | POA: Diagnosis not present

## 2021-02-10 DIAGNOSIS — D509 Iron deficiency anemia, unspecified: Secondary | ICD-10-CM | POA: Diagnosis not present

## 2021-02-11 DIAGNOSIS — Z79899 Other long term (current) drug therapy: Secondary | ICD-10-CM | POA: Diagnosis not present

## 2021-02-11 DIAGNOSIS — R82998 Other abnormal findings in urine: Secondary | ICD-10-CM | POA: Diagnosis not present

## 2021-02-11 DIAGNOSIS — D631 Anemia in chronic kidney disease: Secondary | ICD-10-CM | POA: Diagnosis not present

## 2021-02-11 DIAGNOSIS — E440 Moderate protein-calorie malnutrition: Secondary | ICD-10-CM | POA: Diagnosis not present

## 2021-02-11 DIAGNOSIS — Z992 Dependence on renal dialysis: Secondary | ICD-10-CM | POA: Diagnosis not present

## 2021-02-11 DIAGNOSIS — D509 Iron deficiency anemia, unspecified: Secondary | ICD-10-CM | POA: Diagnosis not present

## 2021-02-11 DIAGNOSIS — N2581 Secondary hyperparathyroidism of renal origin: Secondary | ICD-10-CM | POA: Diagnosis not present

## 2021-02-11 DIAGNOSIS — K769 Liver disease, unspecified: Secondary | ICD-10-CM | POA: Diagnosis not present

## 2021-02-11 DIAGNOSIS — N186 End stage renal disease: Secondary | ICD-10-CM | POA: Diagnosis not present

## 2021-02-12 DIAGNOSIS — Z992 Dependence on renal dialysis: Secondary | ICD-10-CM | POA: Diagnosis not present

## 2021-02-12 DIAGNOSIS — Z79899 Other long term (current) drug therapy: Secondary | ICD-10-CM | POA: Diagnosis not present

## 2021-02-12 DIAGNOSIS — N2581 Secondary hyperparathyroidism of renal origin: Secondary | ICD-10-CM | POA: Diagnosis not present

## 2021-02-12 DIAGNOSIS — E440 Moderate protein-calorie malnutrition: Secondary | ICD-10-CM | POA: Diagnosis not present

## 2021-02-12 DIAGNOSIS — D509 Iron deficiency anemia, unspecified: Secondary | ICD-10-CM | POA: Diagnosis not present

## 2021-02-12 DIAGNOSIS — N186 End stage renal disease: Secondary | ICD-10-CM | POA: Diagnosis not present

## 2021-02-12 DIAGNOSIS — R82998 Other abnormal findings in urine: Secondary | ICD-10-CM | POA: Diagnosis not present

## 2021-02-12 DIAGNOSIS — D631 Anemia in chronic kidney disease: Secondary | ICD-10-CM | POA: Diagnosis not present

## 2021-02-12 DIAGNOSIS — K769 Liver disease, unspecified: Secondary | ICD-10-CM | POA: Diagnosis not present

## 2021-02-13 DIAGNOSIS — Z79899 Other long term (current) drug therapy: Secondary | ICD-10-CM | POA: Diagnosis not present

## 2021-02-13 DIAGNOSIS — N186 End stage renal disease: Secondary | ICD-10-CM | POA: Diagnosis not present

## 2021-02-13 DIAGNOSIS — R82998 Other abnormal findings in urine: Secondary | ICD-10-CM | POA: Diagnosis not present

## 2021-02-13 DIAGNOSIS — E440 Moderate protein-calorie malnutrition: Secondary | ICD-10-CM | POA: Diagnosis not present

## 2021-02-13 DIAGNOSIS — K769 Liver disease, unspecified: Secondary | ICD-10-CM | POA: Diagnosis not present

## 2021-02-13 DIAGNOSIS — N2581 Secondary hyperparathyroidism of renal origin: Secondary | ICD-10-CM | POA: Diagnosis not present

## 2021-02-13 DIAGNOSIS — D631 Anemia in chronic kidney disease: Secondary | ICD-10-CM | POA: Diagnosis not present

## 2021-02-13 DIAGNOSIS — Z992 Dependence on renal dialysis: Secondary | ICD-10-CM | POA: Diagnosis not present

## 2021-02-13 DIAGNOSIS — D509 Iron deficiency anemia, unspecified: Secondary | ICD-10-CM | POA: Diagnosis not present

## 2021-02-14 DIAGNOSIS — N186 End stage renal disease: Secondary | ICD-10-CM | POA: Diagnosis not present

## 2021-02-14 DIAGNOSIS — D631 Anemia in chronic kidney disease: Secondary | ICD-10-CM | POA: Diagnosis not present

## 2021-02-14 DIAGNOSIS — R82998 Other abnormal findings in urine: Secondary | ICD-10-CM | POA: Diagnosis not present

## 2021-02-14 DIAGNOSIS — Z79899 Other long term (current) drug therapy: Secondary | ICD-10-CM | POA: Diagnosis not present

## 2021-02-14 DIAGNOSIS — N2581 Secondary hyperparathyroidism of renal origin: Secondary | ICD-10-CM | POA: Diagnosis not present

## 2021-02-14 DIAGNOSIS — K769 Liver disease, unspecified: Secondary | ICD-10-CM | POA: Diagnosis not present

## 2021-02-14 DIAGNOSIS — E440 Moderate protein-calorie malnutrition: Secondary | ICD-10-CM | POA: Diagnosis not present

## 2021-02-14 DIAGNOSIS — Z992 Dependence on renal dialysis: Secondary | ICD-10-CM | POA: Diagnosis not present

## 2021-02-14 DIAGNOSIS — D509 Iron deficiency anemia, unspecified: Secondary | ICD-10-CM | POA: Diagnosis not present

## 2021-02-15 DIAGNOSIS — N186 End stage renal disease: Secondary | ICD-10-CM | POA: Diagnosis not present

## 2021-02-15 DIAGNOSIS — K769 Liver disease, unspecified: Secondary | ICD-10-CM | POA: Diagnosis not present

## 2021-02-15 DIAGNOSIS — D631 Anemia in chronic kidney disease: Secondary | ICD-10-CM | POA: Diagnosis not present

## 2021-02-15 DIAGNOSIS — E440 Moderate protein-calorie malnutrition: Secondary | ICD-10-CM | POA: Diagnosis not present

## 2021-02-15 DIAGNOSIS — Z79899 Other long term (current) drug therapy: Secondary | ICD-10-CM | POA: Diagnosis not present

## 2021-02-15 DIAGNOSIS — N2581 Secondary hyperparathyroidism of renal origin: Secondary | ICD-10-CM | POA: Diagnosis not present

## 2021-02-15 DIAGNOSIS — Z992 Dependence on renal dialysis: Secondary | ICD-10-CM | POA: Diagnosis not present

## 2021-02-15 DIAGNOSIS — R82998 Other abnormal findings in urine: Secondary | ICD-10-CM | POA: Diagnosis not present

## 2021-02-15 DIAGNOSIS — D509 Iron deficiency anemia, unspecified: Secondary | ICD-10-CM | POA: Diagnosis not present

## 2021-02-16 DIAGNOSIS — Z79899 Other long term (current) drug therapy: Secondary | ICD-10-CM | POA: Diagnosis not present

## 2021-02-16 DIAGNOSIS — E440 Moderate protein-calorie malnutrition: Secondary | ICD-10-CM | POA: Diagnosis not present

## 2021-02-16 DIAGNOSIS — K769 Liver disease, unspecified: Secondary | ICD-10-CM | POA: Diagnosis not present

## 2021-02-16 DIAGNOSIS — N186 End stage renal disease: Secondary | ICD-10-CM | POA: Diagnosis not present

## 2021-02-16 DIAGNOSIS — N2581 Secondary hyperparathyroidism of renal origin: Secondary | ICD-10-CM | POA: Diagnosis not present

## 2021-02-16 DIAGNOSIS — R82998 Other abnormal findings in urine: Secondary | ICD-10-CM | POA: Diagnosis not present

## 2021-02-16 DIAGNOSIS — I129 Hypertensive chronic kidney disease with stage 1 through stage 4 chronic kidney disease, or unspecified chronic kidney disease: Secondary | ICD-10-CM | POA: Diagnosis not present

## 2021-02-16 DIAGNOSIS — D631 Anemia in chronic kidney disease: Secondary | ICD-10-CM | POA: Diagnosis not present

## 2021-02-16 DIAGNOSIS — D509 Iron deficiency anemia, unspecified: Secondary | ICD-10-CM | POA: Diagnosis not present

## 2021-02-16 DIAGNOSIS — Z992 Dependence on renal dialysis: Secondary | ICD-10-CM | POA: Diagnosis not present

## 2021-02-17 DIAGNOSIS — E440 Moderate protein-calorie malnutrition: Secondary | ICD-10-CM | POA: Diagnosis not present

## 2021-02-17 DIAGNOSIS — D631 Anemia in chronic kidney disease: Secondary | ICD-10-CM | POA: Diagnosis not present

## 2021-02-17 DIAGNOSIS — N186 End stage renal disease: Secondary | ICD-10-CM | POA: Diagnosis not present

## 2021-02-17 DIAGNOSIS — D509 Iron deficiency anemia, unspecified: Secondary | ICD-10-CM | POA: Diagnosis not present

## 2021-02-17 DIAGNOSIS — K769 Liver disease, unspecified: Secondary | ICD-10-CM | POA: Diagnosis not present

## 2021-02-17 DIAGNOSIS — Z992 Dependence on renal dialysis: Secondary | ICD-10-CM | POA: Diagnosis not present

## 2021-02-17 DIAGNOSIS — N2581 Secondary hyperparathyroidism of renal origin: Secondary | ICD-10-CM | POA: Diagnosis not present

## 2021-02-17 DIAGNOSIS — Z79899 Other long term (current) drug therapy: Secondary | ICD-10-CM | POA: Diagnosis not present

## 2021-02-17 DIAGNOSIS — R82998 Other abnormal findings in urine: Secondary | ICD-10-CM | POA: Diagnosis not present

## 2021-02-18 ENCOUNTER — Encounter: Payer: Self-pay | Admitting: Internal Medicine

## 2021-02-18 ENCOUNTER — Other Ambulatory Visit: Payer: Self-pay

## 2021-02-18 ENCOUNTER — Ambulatory Visit: Payer: Medicare Other | Admitting: Internal Medicine

## 2021-02-18 DIAGNOSIS — J449 Chronic obstructive pulmonary disease, unspecified: Secondary | ICD-10-CM | POA: Diagnosis not present

## 2021-02-18 DIAGNOSIS — D509 Iron deficiency anemia, unspecified: Secondary | ICD-10-CM | POA: Diagnosis not present

## 2021-02-18 DIAGNOSIS — R82998 Other abnormal findings in urine: Secondary | ICD-10-CM | POA: Diagnosis not present

## 2021-02-18 DIAGNOSIS — N186 End stage renal disease: Secondary | ICD-10-CM | POA: Diagnosis not present

## 2021-02-18 DIAGNOSIS — Z79899 Other long term (current) drug therapy: Secondary | ICD-10-CM | POA: Diagnosis not present

## 2021-02-18 DIAGNOSIS — E440 Moderate protein-calorie malnutrition: Secondary | ICD-10-CM | POA: Diagnosis not present

## 2021-02-18 DIAGNOSIS — Z992 Dependence on renal dialysis: Secondary | ICD-10-CM | POA: Diagnosis not present

## 2021-02-18 DIAGNOSIS — J9612 Chronic respiratory failure with hypercapnia: Secondary | ICD-10-CM | POA: Diagnosis not present

## 2021-02-18 DIAGNOSIS — J9611 Chronic respiratory failure with hypoxia: Secondary | ICD-10-CM | POA: Diagnosis not present

## 2021-02-18 DIAGNOSIS — D631 Anemia in chronic kidney disease: Secondary | ICD-10-CM | POA: Diagnosis not present

## 2021-02-18 DIAGNOSIS — K769 Liver disease, unspecified: Secondary | ICD-10-CM | POA: Diagnosis not present

## 2021-02-18 DIAGNOSIS — N2581 Secondary hyperparathyroidism of renal origin: Secondary | ICD-10-CM | POA: Diagnosis not present

## 2021-02-18 NOTE — Assessment & Plan Note (Signed)
Onset 02/2019 secondary to cap, chf/ copd HC03 05/04/2019 = 32  HCO3  06/14/19   = 25  - Pt d/c'd 02 on his own end of Oct 2020 and again 12/26/2019  - 02/18/2021   Walked on RA x one lap =  approx 250 ft -@ slow pace stopped due to legs tired/hurt    But no sob and sats 100% at end   Resolved, no further w/u or rx needed   Each maintenance medication was reviewed in detail including emphasizing most importantly the difference between maintenance and prns and under what circumstances the prns are to be triggered using an action plan format where appropriate.  Total time for H and P, chart review, counseling,  directly observing portions of ambulatory 02 saturation study/ and generating customized AVS unique to this office visit / same day charting = > 30 min

## 2021-02-18 NOTE — Assessment & Plan Note (Addendum)
Quit smoking 1983  - PFTs 07/16/2013  FEV1  1.75 (61%) ratio 52 and 15% better p B2, DLCO 74% - 06/27/2019  After extensive coaching inhaler device,  effectiveness =    90% with dpi so changed to trelegy maint and proair respiclick> improved as of ov 08/03/2019  -Labs  01/19/2021      alpha one AT phenotype  MM level 279   - Allergy profile 01/19/21  >  Eos 0.4 /  IgE  10   Group D in terms of symptom/risk and laba/lama/ICS  therefore appropriate rx at this point >>>  Continue trelegy one click each am and prn saba  Re saba: I spent extra time with pt today reviewing appropriate use of albuterol for prn use on exertion with the following points: 1) saba is for relief of sob that does not improve by walking a slower pace or resting but rather if the pt does not improve after trying this first. 2) If the pt is convinced, as many are, that saba helps recover from activity faster then it's easy to tell if this is the case by re-challenging : ie stop, take the inhaler, then p 5 minutes try the exact same activity (intensity of workload) that just caused the symptoms and see if they are substantially diminished or not after saba 3) if there is an activity that reproducibly causes the symptoms, try the saba 15 min before the activity on alternate days   If in fact the saba really does help, then fine to continue to use it prn but advised may need to look closer at the maintenance regimen being used to achieve better control of airways disease with exertion.   F/u in 6 m sooner if needed with all meds in hand using a trust but verify approach to confirm accurate Medication  Reconciliation The principal here is that until we are certain that the  patients are doing what we've asked, it makes no sense to ask them to do more.

## 2021-02-18 NOTE — Patient Instructions (Addendum)
Continue same medications which should include pepcid 20 mg (famotidine)  at least an hour before bed.   Please schedule a follow up visit in 6 months but call sooner if needed  with all medications /inhalers/ solutions in hand so we can verify exactly what you are taking. This includes all medications from all doctors and over the counters.

## 2021-02-18 NOTE — Progress Notes (Signed)
David Barton, male    DOB: 10-09-1933    MRN: [id number]   Brief patient profile:  84  yowm  MM/quit smoking 1983  At wt 180 with baseline able to walk the dog 10 minutes slow pace but  avoided steps maint on spiriva with GOLD II COPD criteria 07/16/13     History of Present Illness  04/24/2019  Pulmonary/  office eval/Wert / 02 4lpm 24/7 / persistent afib and started amiodarone since last ov Chief Complaint  Patient presents with  . Pulmonary Consult    Referred by Kerin Ransom, PA. Pt c/o SOB since March 2020. Pt c/o DOE with walking from room to room.  He has had some cough with clear to bloody sputum.     Dyspnea:  Gradually losing ground now Room to room gives out due to sob even on 4lpm  Cough: more bloody/ no pain with cough Sleep: on flat bed, 3 pillows on back SABA [id number] helps the most  rec Stop spiriva and take the  duoneb up to 4 x in 24 hours as you feel you need it if it helps your shortness of breath For cough/congestion >  mucinex or mucinex dm up to 1200 mg every 12 hours and use flutter valve  as much as you can  Humidify 02 as much of the time as your can    06/27/2019  f/u ov/Wert re:  S/p thoracentesis/ HD initiation on anoro and pulmocort Chief Complaint  Patient presents with  . Follow-up    Breathing is "ok" no new co's.   Dyspnea:  Room to room at home / on 3lpm  Cough: none Sleeping: flat in bed / two pillows  02: 3lpm 24/7  rec Plan A = Automatic = Trelegy one click - take two good drags  Plan B = Backup Only use your albuterol (proair resplick) as a rescue medication Plan C = Crisis - only use your albuterol nebulizer if you first try Plan B and it fails to help > ok to use the nebulizer up to every 4 hours but if start needing it regularly call for immediate appointment Stay on 3lpm at bedtime but ok to adjust during the day to keep your saturations over 90%     08/03/2019  f/u ov/Wert re: f/u effusions / copd doing better on trelegy  Chief  Complaint  Patient presents with  . Follow-up    CXR repeated. Breathing has improved. He has not been using his albuterol inhaler or neb.   Dyspnea:  Says can walk "1000 yards" ( I believe he means feet)  off 02 but does not check level of  Treadmill x 5 min tired x 1.2 mph no grade at all, also does not check sats of 02  Cough: none  Sleeping: flat in bed, 2 pillows SABA use: none  02: 3lpm bedtime , not at all during the day rec The 02 saturation should at peak exercise once or twice a week with goal of keeping over 90%  Continue 3lpm at bedtime     11/05/2019  f/u ov/Wert re: copd GOLD II on trelegy/  f/u bilateral effusions/ stopped 02 end of oct 2020 Chief Complaint  Patient presents with  . Follow-up    Breathing is doing well. He c/o occ fatigue.    Dyspnea:  teadmill x 7min RA x  1.2 mph 2 notches  Cough: no cough just some throat clearing, mild hoarseness on trelgy Sleeping: flat p sleeping pill  x one pillow x 2  SABA use: none  02: no longer using any 02 x 3 weeks  - was on 1pm when stopped  rec Have your wife check your oxygen level sleeping - we need to keep it around 90% or higher   07/01/20 NP ov  rec Stop Trelegy Start Stiolto Respimat- take 2 puffs once daily in the morning    09/17/2020  f/u ov/Wert re:  Back on trelegy p breathing worse on stiolto  Chief Complaint  Patient presents with  . Follow-up  Dyspnea:  Not doing treadmill x one week due positional L back pain and both legs feel weak s numbness or radicular features to pain which radiates up to lower T spine but no pleuritic features, feels better supine with no assoc gi/gu symptoms Cough: no cough / but hoarse  Sleeping: flat in bed / one pillow / PD  SABA use: not using any 02: none  rec No change rx   televisit  12/30/20 For cough / congestion >>>  mucinex dm 1200 mg every 12 hours as needed  (2 x 600 every 12 hours)  Plan A = Automatic = Always=    Continue trelegy one click  Plan B =  Backup (to supplement plan A, not to replace it) Only use your albuterol inhaler as a rescue medication Also ok to try albuterol 15 min before an activity   01/19/2021  f/u ov/Wert re:  GOLD II/ did not bring meds, easily confused with details of care. maint on trelegy but wrose x one month /sob  Chief Complaint  Patient presents with  . Follow-up    Sob-worse,cough-clear  Dyspnea:  Walking around the house sev times wears him out, has not tried saba first as rec  Cough: mucus  Sleeping: cough worse at hs at 30 degrees electric   SABA use: none  02: none  rec For cough / congestion >>>  mucinex dm 1200 mg every 12 hours as needed  (2 x 600 every 12 hours)  And use the flutter valve as much as possible  Make sure you check your oxygen saturation at your highest level of activity  Pantoprazole (protonix) 40 mg   Take  30-60 min before first meal of the day and Pepcid (famotidine)  20 mg one an hour before   bedtime until return to office - this is the best way to tell whether stomach acid is contributing to your problem.   GERD diet Prednisone 10 mg take  4 each am x 2 days,   2 each am x 2 days,  1 each am x 2 days and stop    02/18/2021  f/u ov/Wert re:  GOLD II / no meds with trelegy only  Chief Complaint  Patient presents with  . Follow-up    Sob with exertion, dry cough last night   Dyspnea:  Walk 5 min flat and could do more/ starting to do treadmill  Cough: better and not using mucinex dm  Sleeping: 30 degrees electric bed/ on PD nightly  SABA use: none  02: none  Covid status:  moderna x 3    No obvious day to day or daytime variability or assoc excess/ purulent sputum or mucus plugs or hemoptysis or cp or chest tightness, subjective wheeze or overt sinus or hb symptoms.   Sleeping  without nocturnal  or early am exacerbation  of respiratory  c/o's or need for noct saba. Also denies any obvious fluctuation of symptoms  with weather or environmental changes or other aggravating  or alleviating factors except as outlined above   No unusual exposure hx or h/o childhood pna/ asthma or knowledge of premature birth.  Current Allergies, Complete Past Medical History, Past Surgical History, Family History, and Social History were reviewed in [employer] record.  ROS  The following are not active complaints unless bolded Hoarseness, sore throat, dysphagia, dental problems, itching, sneezing,  nasal congestion or discharge of excess mucus or purulent secretions, ear ache,   fever, chills, sweats, unintended wt loss or wt gain, classically pleuritic or exertional cp,  orthopnea pnd or arm/hand swelling  or leg swelling, presyncope, palpitations, abdominal pain, anorexia, nausea, vomiting, diarrhea  or change in bowel habits or change in bladder habits, change in stools or change in urine, dysuria, hematuria,  rash, arthralgias, visual complaints, headache, numbness, weakness or ataxia or problems with walking or coordination,  change in mood or  memory.        Current Meds  Medication Sig  . albuterol (PROAIR HFA) 108 (90 Base) MCG/ACT inhaler 2 puffs every 4 hours as needed only  if your can't catch your breath (Patient taking differently: 2 puffs every 4 hours as needed only  if your can't catch your breath)  . ELIQUIS 2.5 MG TABS tablet TAKE 1 TABLET(2.5 MG) BY MOUTH TWICE DAILY  . famotidine (PEPCID) 20 MG tablet One after supper  . Fluticasone-Umeclidin-Vilant (TRELEGY ELLIPTA) 100-62.5-25 MCG/INH AEPB Inhale 1 puff into the lungs daily.  . metoprolol succinate (TOPROL XL) 25 MG 24 hr tablet Take 1 tablet (25 mg total) by mouth daily.  . multivitamin (RENA-VIT) TABS tablet Take 1 tablet by mouth at bedtime.  . pantoprazole (PROTONIX) 40 MG tablet Take 1 tablet (40 mg total) by mouth daily. Take 30-60 min before first meal of the day  . sevelamer carbonate (RENVELA) 800 MG tablet Take 1 tablet (800 mg total) by mouth 3 (three) times daily with meals.  Marland Kitchen  zolpidem (AMBIEN) 10 MG tablet Take 10 mg by mouth at bedtime as needed.                Objective:   02/18/2021          183  01/19/2021        183  09/17/2020        184  11/05/2019      190   08/03/2019       195   06/27/2019         208   04/24/19 231 lb (104.8 kg)  04/17/19 225 lb (102.1 kg)  03/26/19 226 lb 3.1 oz (102.6 kg)   Vital signs reviewed  02/18/2021  - Note at rest 02 sats  96% on RA   General appearance:    Hoarse amb upbeat wm nad     HEENT : pt wearing mask not removed for exam due to covid - 19 concerns.    NECK :  without JVD/Nodes/TM/ nl carotid upstrokes bilaterally   LUNGS: no acc muscle use,  Mild barrel  contour chest wall with bilateral  Distant bs s audible wheeze and  without cough on insp or exp maneuvers  and mild  Hyperresonant  to  percussion bilaterally     CV:  RRR  no s3 or murmur or increase in P2, and no edema   ABD:  soft and nontender with pos end  insp Hoover's  in the supine position. No bruits or organomegaly appreciated,  bowel sounds nl  MS:   Nl gait/  ext warm without deformities, calf tenderness, cyanosis or clubbing No obvious joint restrictions   SKIN: warm and dry without lesions    NEURO:  alert, approp, nl sensorium with  no motor or cerebellar deficits apparent.                 Assessment

## 2021-02-19 DIAGNOSIS — Z992 Dependence on renal dialysis: Secondary | ICD-10-CM | POA: Diagnosis not present

## 2021-02-19 DIAGNOSIS — D509 Iron deficiency anemia, unspecified: Secondary | ICD-10-CM | POA: Diagnosis not present

## 2021-02-19 DIAGNOSIS — R221 Localized swelling, mass and lump, neck: Secondary | ICD-10-CM | POA: Diagnosis not present

## 2021-02-19 DIAGNOSIS — N2581 Secondary hyperparathyroidism of renal origin: Secondary | ICD-10-CM | POA: Diagnosis not present

## 2021-02-19 DIAGNOSIS — R82998 Other abnormal findings in urine: Secondary | ICD-10-CM | POA: Diagnosis not present

## 2021-02-19 DIAGNOSIS — D631 Anemia in chronic kidney disease: Secondary | ICD-10-CM | POA: Diagnosis not present

## 2021-02-19 DIAGNOSIS — H918X9 Other specified hearing loss, unspecified ear: Secondary | ICD-10-CM | POA: Diagnosis not present

## 2021-02-19 DIAGNOSIS — Z79899 Other long term (current) drug therapy: Secondary | ICD-10-CM | POA: Diagnosis not present

## 2021-02-19 DIAGNOSIS — H6123 Impacted cerumen, bilateral: Secondary | ICD-10-CM | POA: Diagnosis not present

## 2021-02-19 DIAGNOSIS — E440 Moderate protein-calorie malnutrition: Secondary | ICD-10-CM | POA: Diagnosis not present

## 2021-02-19 DIAGNOSIS — K769 Liver disease, unspecified: Secondary | ICD-10-CM | POA: Diagnosis not present

## 2021-02-19 DIAGNOSIS — N186 End stage renal disease: Secondary | ICD-10-CM | POA: Diagnosis not present

## 2021-02-20 DIAGNOSIS — D509 Iron deficiency anemia, unspecified: Secondary | ICD-10-CM | POA: Diagnosis not present

## 2021-02-20 DIAGNOSIS — N186 End stage renal disease: Secondary | ICD-10-CM | POA: Diagnosis not present

## 2021-02-20 DIAGNOSIS — K769 Liver disease, unspecified: Secondary | ICD-10-CM | POA: Diagnosis not present

## 2021-02-20 DIAGNOSIS — R82998 Other abnormal findings in urine: Secondary | ICD-10-CM | POA: Diagnosis not present

## 2021-02-20 DIAGNOSIS — N2581 Secondary hyperparathyroidism of renal origin: Secondary | ICD-10-CM | POA: Diagnosis not present

## 2021-02-20 DIAGNOSIS — Z79899 Other long term (current) drug therapy: Secondary | ICD-10-CM | POA: Diagnosis not present

## 2021-02-20 DIAGNOSIS — Z992 Dependence on renal dialysis: Secondary | ICD-10-CM | POA: Diagnosis not present

## 2021-02-20 DIAGNOSIS — D631 Anemia in chronic kidney disease: Secondary | ICD-10-CM | POA: Diagnosis not present

## 2021-02-20 DIAGNOSIS — E440 Moderate protein-calorie malnutrition: Secondary | ICD-10-CM | POA: Diagnosis not present

## 2021-02-21 DIAGNOSIS — N2581 Secondary hyperparathyroidism of renal origin: Secondary | ICD-10-CM | POA: Diagnosis not present

## 2021-02-21 DIAGNOSIS — D631 Anemia in chronic kidney disease: Secondary | ICD-10-CM | POA: Diagnosis not present

## 2021-02-21 DIAGNOSIS — E440 Moderate protein-calorie malnutrition: Secondary | ICD-10-CM | POA: Diagnosis not present

## 2021-02-21 DIAGNOSIS — Z79899 Other long term (current) drug therapy: Secondary | ICD-10-CM | POA: Diagnosis not present

## 2021-02-21 DIAGNOSIS — N186 End stage renal disease: Secondary | ICD-10-CM | POA: Diagnosis not present

## 2021-02-21 DIAGNOSIS — Z992 Dependence on renal dialysis: Secondary | ICD-10-CM | POA: Diagnosis not present

## 2021-02-21 DIAGNOSIS — K769 Liver disease, unspecified: Secondary | ICD-10-CM | POA: Diagnosis not present

## 2021-02-21 DIAGNOSIS — R82998 Other abnormal findings in urine: Secondary | ICD-10-CM | POA: Diagnosis not present

## 2021-02-21 DIAGNOSIS — D509 Iron deficiency anemia, unspecified: Secondary | ICD-10-CM | POA: Diagnosis not present

## 2021-02-22 DIAGNOSIS — N2581 Secondary hyperparathyroidism of renal origin: Secondary | ICD-10-CM | POA: Diagnosis not present

## 2021-02-22 DIAGNOSIS — N186 End stage renal disease: Secondary | ICD-10-CM | POA: Diagnosis not present

## 2021-02-22 DIAGNOSIS — K769 Liver disease, unspecified: Secondary | ICD-10-CM | POA: Diagnosis not present

## 2021-02-22 DIAGNOSIS — Z992 Dependence on renal dialysis: Secondary | ICD-10-CM | POA: Diagnosis not present

## 2021-02-22 DIAGNOSIS — E440 Moderate protein-calorie malnutrition: Secondary | ICD-10-CM | POA: Diagnosis not present

## 2021-02-22 DIAGNOSIS — R82998 Other abnormal findings in urine: Secondary | ICD-10-CM | POA: Diagnosis not present

## 2021-02-22 DIAGNOSIS — Z79899 Other long term (current) drug therapy: Secondary | ICD-10-CM | POA: Diagnosis not present

## 2021-02-22 DIAGNOSIS — D509 Iron deficiency anemia, unspecified: Secondary | ICD-10-CM | POA: Diagnosis not present

## 2021-02-22 DIAGNOSIS — D631 Anemia in chronic kidney disease: Secondary | ICD-10-CM | POA: Diagnosis not present

## 2021-02-23 DIAGNOSIS — K118 Other diseases of salivary glands: Secondary | ICD-10-CM | POA: Diagnosis not present

## 2021-02-23 DIAGNOSIS — H6042 Cholesteatoma of left external ear: Secondary | ICD-10-CM | POA: Diagnosis not present

## 2021-02-23 DIAGNOSIS — D509 Iron deficiency anemia, unspecified: Secondary | ICD-10-CM | POA: Diagnosis not present

## 2021-02-23 DIAGNOSIS — Z79899 Other long term (current) drug therapy: Secondary | ICD-10-CM | POA: Diagnosis not present

## 2021-02-23 DIAGNOSIS — D631 Anemia in chronic kidney disease: Secondary | ICD-10-CM | POA: Diagnosis not present

## 2021-02-23 DIAGNOSIS — K769 Liver disease, unspecified: Secondary | ICD-10-CM | POA: Diagnosis not present

## 2021-02-23 DIAGNOSIS — N186 End stage renal disease: Secondary | ICD-10-CM | POA: Diagnosis not present

## 2021-02-23 DIAGNOSIS — H90A21 Sensorineural hearing loss, unilateral, right ear, with restricted hearing on the contralateral side: Secondary | ICD-10-CM | POA: Diagnosis not present

## 2021-02-23 DIAGNOSIS — H903 Sensorineural hearing loss, bilateral: Secondary | ICD-10-CM | POA: Diagnosis not present

## 2021-02-23 DIAGNOSIS — R82998 Other abnormal findings in urine: Secondary | ICD-10-CM | POA: Diagnosis not present

## 2021-02-23 DIAGNOSIS — Z992 Dependence on renal dialysis: Secondary | ICD-10-CM | POA: Diagnosis not present

## 2021-02-23 DIAGNOSIS — H90A32 Mixed conductive and sensorineural hearing loss, unilateral, left ear with restricted hearing on the contralateral side: Secondary | ICD-10-CM | POA: Diagnosis not present

## 2021-02-23 DIAGNOSIS — N2581 Secondary hyperparathyroidism of renal origin: Secondary | ICD-10-CM | POA: Diagnosis not present

## 2021-02-23 DIAGNOSIS — E440 Moderate protein-calorie malnutrition: Secondary | ICD-10-CM | POA: Diagnosis not present

## 2021-02-24 DIAGNOSIS — Z79899 Other long term (current) drug therapy: Secondary | ICD-10-CM | POA: Diagnosis not present

## 2021-02-24 DIAGNOSIS — E440 Moderate protein-calorie malnutrition: Secondary | ICD-10-CM | POA: Diagnosis not present

## 2021-02-24 DIAGNOSIS — D509 Iron deficiency anemia, unspecified: Secondary | ICD-10-CM | POA: Diagnosis not present

## 2021-02-24 DIAGNOSIS — R82998 Other abnormal findings in urine: Secondary | ICD-10-CM | POA: Diagnosis not present

## 2021-02-24 DIAGNOSIS — Z992 Dependence on renal dialysis: Secondary | ICD-10-CM | POA: Diagnosis not present

## 2021-02-24 DIAGNOSIS — D631 Anemia in chronic kidney disease: Secondary | ICD-10-CM | POA: Diagnosis not present

## 2021-02-24 DIAGNOSIS — N2581 Secondary hyperparathyroidism of renal origin: Secondary | ICD-10-CM | POA: Diagnosis not present

## 2021-02-24 DIAGNOSIS — N186 End stage renal disease: Secondary | ICD-10-CM | POA: Diagnosis not present

## 2021-02-24 DIAGNOSIS — K769 Liver disease, unspecified: Secondary | ICD-10-CM | POA: Diagnosis not present

## 2021-02-25 ENCOUNTER — Other Ambulatory Visit: Payer: Self-pay | Admitting: Nephrology

## 2021-02-25 ENCOUNTER — Ambulatory Visit
Admission: RE | Admit: 2021-02-25 | Discharge: 2021-02-25 | Disposition: A | Discharge status: Home / Self Care | Payer: Medicare Other | Source: Ambulatory Visit | Attending: Nephrology | Admitting: Nephrology

## 2021-02-25 DIAGNOSIS — E440 Moderate protein-calorie malnutrition: Secondary | ICD-10-CM | POA: Diagnosis not present

## 2021-02-25 DIAGNOSIS — T85691A Other mechanical complication of intraperitoneal dialysis catheter, initial encounter: Secondary | ICD-10-CM

## 2021-02-25 DIAGNOSIS — K8020 Calculus of gallbladder without cholecystitis without obstruction: Secondary | ICD-10-CM | POA: Diagnosis not present

## 2021-02-25 DIAGNOSIS — D509 Iron deficiency anemia, unspecified: Secondary | ICD-10-CM | POA: Diagnosis not present

## 2021-02-25 DIAGNOSIS — Z79899 Other long term (current) drug therapy: Secondary | ICD-10-CM | POA: Diagnosis not present

## 2021-02-25 DIAGNOSIS — Z992 Dependence on renal dialysis: Secondary | ICD-10-CM | POA: Diagnosis not present

## 2021-02-25 DIAGNOSIS — N2581 Secondary hyperparathyroidism of renal origin: Secondary | ICD-10-CM | POA: Diagnosis not present

## 2021-02-25 DIAGNOSIS — R82998 Other abnormal findings in urine: Secondary | ICD-10-CM | POA: Diagnosis not present

## 2021-02-25 DIAGNOSIS — N186 End stage renal disease: Secondary | ICD-10-CM | POA: Diagnosis not present

## 2021-02-25 DIAGNOSIS — K769 Liver disease, unspecified: Secondary | ICD-10-CM | POA: Diagnosis not present

## 2021-02-25 DIAGNOSIS — D631 Anemia in chronic kidney disease: Secondary | ICD-10-CM | POA: Diagnosis not present

## 2021-02-26 ENCOUNTER — Telehealth: Payer: Self-pay | Admitting: Cardiovascular Disease

## 2021-02-26 DIAGNOSIS — E440 Moderate protein-calorie malnutrition: Secondary | ICD-10-CM | POA: Diagnosis not present

## 2021-02-26 DIAGNOSIS — K769 Liver disease, unspecified: Secondary | ICD-10-CM | POA: Diagnosis not present

## 2021-02-26 DIAGNOSIS — D509 Iron deficiency anemia, unspecified: Secondary | ICD-10-CM | POA: Diagnosis not present

## 2021-02-26 DIAGNOSIS — D631 Anemia in chronic kidney disease: Secondary | ICD-10-CM | POA: Diagnosis not present

## 2021-02-26 DIAGNOSIS — N2581 Secondary hyperparathyroidism of renal origin: Secondary | ICD-10-CM | POA: Diagnosis not present

## 2021-02-26 DIAGNOSIS — Z79899 Other long term (current) drug therapy: Secondary | ICD-10-CM | POA: Diagnosis not present

## 2021-02-26 DIAGNOSIS — Z992 Dependence on renal dialysis: Secondary | ICD-10-CM | POA: Diagnosis not present

## 2021-02-26 DIAGNOSIS — N186 End stage renal disease: Secondary | ICD-10-CM | POA: Diagnosis not present

## 2021-02-26 DIAGNOSIS — R82998 Other abnormal findings in urine: Secondary | ICD-10-CM | POA: Diagnosis not present

## 2021-02-26 NOTE — Telephone Encounter (Signed)
Pt called in and would like the advice of Dr Gwenlyn Found.  His ENT dr would like to do a 3 hr procedure (See ENT notes) He was not sure what the name of the procedure was but he would like to Dr Gwenlyn Found thoughts    Best number [phone]

## 2021-02-26 NOTE — Telephone Encounter (Signed)
Pt wife state his ENT physician wants to do a procedure that will take 3 hours and wanted the advice of Dr. Gwenlyn Found.  Nurse advised wife to have physician office fax over a surgical clearance form. Wife verbalized understanding.

## 2021-02-27 DIAGNOSIS — D509 Iron deficiency anemia, unspecified: Secondary | ICD-10-CM | POA: Diagnosis not present

## 2021-02-27 DIAGNOSIS — E440 Moderate protein-calorie malnutrition: Secondary | ICD-10-CM | POA: Diagnosis not present

## 2021-02-27 DIAGNOSIS — Z79899 Other long term (current) drug therapy: Secondary | ICD-10-CM | POA: Diagnosis not present

## 2021-02-27 DIAGNOSIS — N186 End stage renal disease: Secondary | ICD-10-CM | POA: Diagnosis not present

## 2021-02-27 DIAGNOSIS — D631 Anemia in chronic kidney disease: Secondary | ICD-10-CM | POA: Diagnosis not present

## 2021-02-27 DIAGNOSIS — K769 Liver disease, unspecified: Secondary | ICD-10-CM | POA: Diagnosis not present

## 2021-02-27 DIAGNOSIS — R82998 Other abnormal findings in urine: Secondary | ICD-10-CM | POA: Diagnosis not present

## 2021-02-27 DIAGNOSIS — Z992 Dependence on renal dialysis: Secondary | ICD-10-CM | POA: Diagnosis not present

## 2021-02-27 DIAGNOSIS — N2581 Secondary hyperparathyroidism of renal origin: Secondary | ICD-10-CM | POA: Diagnosis not present

## 2021-02-28 DIAGNOSIS — Z992 Dependence on renal dialysis: Secondary | ICD-10-CM | POA: Diagnosis not present

## 2021-02-28 DIAGNOSIS — N186 End stage renal disease: Secondary | ICD-10-CM | POA: Diagnosis not present

## 2021-02-28 DIAGNOSIS — D509 Iron deficiency anemia, unspecified: Secondary | ICD-10-CM | POA: Diagnosis not present

## 2021-02-28 DIAGNOSIS — Z79899 Other long term (current) drug therapy: Secondary | ICD-10-CM | POA: Diagnosis not present

## 2021-02-28 DIAGNOSIS — E440 Moderate protein-calorie malnutrition: Secondary | ICD-10-CM | POA: Diagnosis not present

## 2021-02-28 DIAGNOSIS — D631 Anemia in chronic kidney disease: Secondary | ICD-10-CM | POA: Diagnosis not present

## 2021-02-28 DIAGNOSIS — N2581 Secondary hyperparathyroidism of renal origin: Secondary | ICD-10-CM | POA: Diagnosis not present

## 2021-02-28 DIAGNOSIS — R82998 Other abnormal findings in urine: Secondary | ICD-10-CM | POA: Diagnosis not present

## 2021-02-28 DIAGNOSIS — K769 Liver disease, unspecified: Secondary | ICD-10-CM | POA: Diagnosis not present

## 2021-03-01 DIAGNOSIS — Z992 Dependence on renal dialysis: Secondary | ICD-10-CM | POA: Diagnosis not present

## 2021-03-01 DIAGNOSIS — Z79899 Other long term (current) drug therapy: Secondary | ICD-10-CM | POA: Diagnosis not present

## 2021-03-01 DIAGNOSIS — K769 Liver disease, unspecified: Secondary | ICD-10-CM | POA: Diagnosis not present

## 2021-03-01 DIAGNOSIS — D509 Iron deficiency anemia, unspecified: Secondary | ICD-10-CM | POA: Diagnosis not present

## 2021-03-01 DIAGNOSIS — E440 Moderate protein-calorie malnutrition: Secondary | ICD-10-CM | POA: Diagnosis not present

## 2021-03-01 DIAGNOSIS — N186 End stage renal disease: Secondary | ICD-10-CM | POA: Diagnosis not present

## 2021-03-01 DIAGNOSIS — R82998 Other abnormal findings in urine: Secondary | ICD-10-CM | POA: Diagnosis not present

## 2021-03-01 DIAGNOSIS — N2581 Secondary hyperparathyroidism of renal origin: Secondary | ICD-10-CM | POA: Diagnosis not present

## 2021-03-01 DIAGNOSIS — D631 Anemia in chronic kidney disease: Secondary | ICD-10-CM | POA: Diagnosis not present

## 2021-03-02 DIAGNOSIS — R82998 Other abnormal findings in urine: Secondary | ICD-10-CM | POA: Diagnosis not present

## 2021-03-02 DIAGNOSIS — K769 Liver disease, unspecified: Secondary | ICD-10-CM | POA: Diagnosis not present

## 2021-03-02 DIAGNOSIS — E440 Moderate protein-calorie malnutrition: Secondary | ICD-10-CM | POA: Diagnosis not present

## 2021-03-02 DIAGNOSIS — Z79899 Other long term (current) drug therapy: Secondary | ICD-10-CM | POA: Diagnosis not present

## 2021-03-02 DIAGNOSIS — N2581 Secondary hyperparathyroidism of renal origin: Secondary | ICD-10-CM | POA: Diagnosis not present

## 2021-03-02 DIAGNOSIS — N186 End stage renal disease: Secondary | ICD-10-CM | POA: Diagnosis not present

## 2021-03-02 DIAGNOSIS — D509 Iron deficiency anemia, unspecified: Secondary | ICD-10-CM | POA: Diagnosis not present

## 2021-03-02 DIAGNOSIS — Z992 Dependence on renal dialysis: Secondary | ICD-10-CM | POA: Diagnosis not present

## 2021-03-02 DIAGNOSIS — D631 Anemia in chronic kidney disease: Secondary | ICD-10-CM | POA: Diagnosis not present

## 2021-03-03 DIAGNOSIS — D631 Anemia in chronic kidney disease: Secondary | ICD-10-CM | POA: Diagnosis not present

## 2021-03-03 DIAGNOSIS — N2581 Secondary hyperparathyroidism of renal origin: Secondary | ICD-10-CM | POA: Diagnosis not present

## 2021-03-03 DIAGNOSIS — Z79899 Other long term (current) drug therapy: Secondary | ICD-10-CM | POA: Diagnosis not present

## 2021-03-03 DIAGNOSIS — K769 Liver disease, unspecified: Secondary | ICD-10-CM | POA: Diagnosis not present

## 2021-03-03 DIAGNOSIS — Z992 Dependence on renal dialysis: Secondary | ICD-10-CM | POA: Diagnosis not present

## 2021-03-03 DIAGNOSIS — N186 End stage renal disease: Secondary | ICD-10-CM | POA: Diagnosis not present

## 2021-03-03 DIAGNOSIS — E440 Moderate protein-calorie malnutrition: Secondary | ICD-10-CM | POA: Diagnosis not present

## 2021-03-03 DIAGNOSIS — R82998 Other abnormal findings in urine: Secondary | ICD-10-CM | POA: Diagnosis not present

## 2021-03-03 DIAGNOSIS — D509 Iron deficiency anemia, unspecified: Secondary | ICD-10-CM | POA: Diagnosis not present

## 2021-03-04 DIAGNOSIS — K769 Liver disease, unspecified: Secondary | ICD-10-CM | POA: Diagnosis not present

## 2021-03-04 DIAGNOSIS — N186 End stage renal disease: Secondary | ICD-10-CM | POA: Diagnosis not present

## 2021-03-04 DIAGNOSIS — N2581 Secondary hyperparathyroidism of renal origin: Secondary | ICD-10-CM | POA: Diagnosis not present

## 2021-03-04 DIAGNOSIS — E440 Moderate protein-calorie malnutrition: Secondary | ICD-10-CM | POA: Diagnosis not present

## 2021-03-04 DIAGNOSIS — Z79899 Other long term (current) drug therapy: Secondary | ICD-10-CM | POA: Diagnosis not present

## 2021-03-04 DIAGNOSIS — D631 Anemia in chronic kidney disease: Secondary | ICD-10-CM | POA: Diagnosis not present

## 2021-03-04 DIAGNOSIS — Z992 Dependence on renal dialysis: Secondary | ICD-10-CM | POA: Diagnosis not present

## 2021-03-04 DIAGNOSIS — R82998 Other abnormal findings in urine: Secondary | ICD-10-CM | POA: Diagnosis not present

## 2021-03-04 DIAGNOSIS — D509 Iron deficiency anemia, unspecified: Secondary | ICD-10-CM | POA: Diagnosis not present

## 2021-03-05 DIAGNOSIS — D631 Anemia in chronic kidney disease: Secondary | ICD-10-CM | POA: Diagnosis not present

## 2021-03-05 DIAGNOSIS — D509 Iron deficiency anemia, unspecified: Secondary | ICD-10-CM | POA: Diagnosis not present

## 2021-03-05 DIAGNOSIS — I480 Paroxysmal atrial fibrillation: Secondary | ICD-10-CM | POA: Diagnosis not present

## 2021-03-05 DIAGNOSIS — Z992 Dependence on renal dialysis: Secondary | ICD-10-CM | POA: Diagnosis not present

## 2021-03-05 DIAGNOSIS — Z79899 Other long term (current) drug therapy: Secondary | ICD-10-CM | POA: Diagnosis not present

## 2021-03-05 DIAGNOSIS — J449 Chronic obstructive pulmonary disease, unspecified: Secondary | ICD-10-CM | POA: Diagnosis not present

## 2021-03-05 DIAGNOSIS — N186 End stage renal disease: Secondary | ICD-10-CM | POA: Diagnosis not present

## 2021-03-05 DIAGNOSIS — H6123 Impacted cerumen, bilateral: Secondary | ICD-10-CM | POA: Diagnosis not present

## 2021-03-05 DIAGNOSIS — F5101 Primary insomnia: Secondary | ICD-10-CM | POA: Diagnosis not present

## 2021-03-05 DIAGNOSIS — E440 Moderate protein-calorie malnutrition: Secondary | ICD-10-CM | POA: Diagnosis not present

## 2021-03-05 DIAGNOSIS — N2581 Secondary hyperparathyroidism of renal origin: Secondary | ICD-10-CM | POA: Diagnosis not present

## 2021-03-05 DIAGNOSIS — R82998 Other abnormal findings in urine: Secondary | ICD-10-CM | POA: Diagnosis not present

## 2021-03-05 DIAGNOSIS — K769 Liver disease, unspecified: Secondary | ICD-10-CM | POA: Diagnosis not present

## 2021-03-06 DIAGNOSIS — K769 Liver disease, unspecified: Secondary | ICD-10-CM | POA: Diagnosis not present

## 2021-03-06 DIAGNOSIS — E440 Moderate protein-calorie malnutrition: Secondary | ICD-10-CM | POA: Diagnosis not present

## 2021-03-06 DIAGNOSIS — D631 Anemia in chronic kidney disease: Secondary | ICD-10-CM | POA: Diagnosis not present

## 2021-03-06 DIAGNOSIS — N186 End stage renal disease: Secondary | ICD-10-CM | POA: Diagnosis not present

## 2021-03-06 DIAGNOSIS — N2581 Secondary hyperparathyroidism of renal origin: Secondary | ICD-10-CM | POA: Diagnosis not present

## 2021-03-06 DIAGNOSIS — R82998 Other abnormal findings in urine: Secondary | ICD-10-CM | POA: Diagnosis not present

## 2021-03-06 DIAGNOSIS — Z992 Dependence on renal dialysis: Secondary | ICD-10-CM | POA: Diagnosis not present

## 2021-03-06 DIAGNOSIS — D509 Iron deficiency anemia, unspecified: Secondary | ICD-10-CM | POA: Diagnosis not present

## 2021-03-06 DIAGNOSIS — Z79899 Other long term (current) drug therapy: Secondary | ICD-10-CM | POA: Diagnosis not present

## 2021-03-07 DIAGNOSIS — E440 Moderate protein-calorie malnutrition: Secondary | ICD-10-CM | POA: Diagnosis not present

## 2021-03-07 DIAGNOSIS — D509 Iron deficiency anemia, unspecified: Secondary | ICD-10-CM | POA: Diagnosis not present

## 2021-03-07 DIAGNOSIS — N2581 Secondary hyperparathyroidism of renal origin: Secondary | ICD-10-CM | POA: Diagnosis not present

## 2021-03-07 DIAGNOSIS — D631 Anemia in chronic kidney disease: Secondary | ICD-10-CM | POA: Diagnosis not present

## 2021-03-07 DIAGNOSIS — Z992 Dependence on renal dialysis: Secondary | ICD-10-CM | POA: Diagnosis not present

## 2021-03-07 DIAGNOSIS — K769 Liver disease, unspecified: Secondary | ICD-10-CM | POA: Diagnosis not present

## 2021-03-07 DIAGNOSIS — N186 End stage renal disease: Secondary | ICD-10-CM | POA: Diagnosis not present

## 2021-03-07 DIAGNOSIS — Z79899 Other long term (current) drug therapy: Secondary | ICD-10-CM | POA: Diagnosis not present

## 2021-03-07 DIAGNOSIS — R82998 Other abnormal findings in urine: Secondary | ICD-10-CM | POA: Diagnosis not present

## 2021-03-08 DIAGNOSIS — Z992 Dependence on renal dialysis: Secondary | ICD-10-CM | POA: Diagnosis not present

## 2021-03-08 DIAGNOSIS — R82998 Other abnormal findings in urine: Secondary | ICD-10-CM | POA: Diagnosis not present

## 2021-03-08 DIAGNOSIS — E440 Moderate protein-calorie malnutrition: Secondary | ICD-10-CM | POA: Diagnosis not present

## 2021-03-08 DIAGNOSIS — Z79899 Other long term (current) drug therapy: Secondary | ICD-10-CM | POA: Diagnosis not present

## 2021-03-08 DIAGNOSIS — N186 End stage renal disease: Secondary | ICD-10-CM | POA: Diagnosis not present

## 2021-03-08 DIAGNOSIS — D509 Iron deficiency anemia, unspecified: Secondary | ICD-10-CM | POA: Diagnosis not present

## 2021-03-08 DIAGNOSIS — D631 Anemia in chronic kidney disease: Secondary | ICD-10-CM | POA: Diagnosis not present

## 2021-03-08 DIAGNOSIS — N2581 Secondary hyperparathyroidism of renal origin: Secondary | ICD-10-CM | POA: Diagnosis not present

## 2021-03-08 DIAGNOSIS — K769 Liver disease, unspecified: Secondary | ICD-10-CM | POA: Diagnosis not present

## 2021-03-09 DIAGNOSIS — K769 Liver disease, unspecified: Secondary | ICD-10-CM | POA: Diagnosis not present

## 2021-03-09 DIAGNOSIS — N186 End stage renal disease: Secondary | ICD-10-CM | POA: Diagnosis not present

## 2021-03-09 DIAGNOSIS — E440 Moderate protein-calorie malnutrition: Secondary | ICD-10-CM | POA: Diagnosis not present

## 2021-03-09 DIAGNOSIS — R82998 Other abnormal findings in urine: Secondary | ICD-10-CM | POA: Diagnosis not present

## 2021-03-09 DIAGNOSIS — D509 Iron deficiency anemia, unspecified: Secondary | ICD-10-CM | POA: Diagnosis not present

## 2021-03-09 DIAGNOSIS — N2581 Secondary hyperparathyroidism of renal origin: Secondary | ICD-10-CM | POA: Diagnosis not present

## 2021-03-09 DIAGNOSIS — Z79899 Other long term (current) drug therapy: Secondary | ICD-10-CM | POA: Diagnosis not present

## 2021-03-09 DIAGNOSIS — D631 Anemia in chronic kidney disease: Secondary | ICD-10-CM | POA: Diagnosis not present

## 2021-03-09 DIAGNOSIS — Z992 Dependence on renal dialysis: Secondary | ICD-10-CM | POA: Diagnosis not present

## 2021-03-10 DIAGNOSIS — D509 Iron deficiency anemia, unspecified: Secondary | ICD-10-CM | POA: Diagnosis not present

## 2021-03-10 DIAGNOSIS — E440 Moderate protein-calorie malnutrition: Secondary | ICD-10-CM | POA: Diagnosis not present

## 2021-03-10 DIAGNOSIS — Z992 Dependence on renal dialysis: Secondary | ICD-10-CM | POA: Diagnosis not present

## 2021-03-10 DIAGNOSIS — R82998 Other abnormal findings in urine: Secondary | ICD-10-CM | POA: Diagnosis not present

## 2021-03-10 DIAGNOSIS — N2581 Secondary hyperparathyroidism of renal origin: Secondary | ICD-10-CM | POA: Diagnosis not present

## 2021-03-10 DIAGNOSIS — Z79899 Other long term (current) drug therapy: Secondary | ICD-10-CM | POA: Diagnosis not present

## 2021-03-10 DIAGNOSIS — N186 End stage renal disease: Secondary | ICD-10-CM | POA: Diagnosis not present

## 2021-03-10 DIAGNOSIS — D631 Anemia in chronic kidney disease: Secondary | ICD-10-CM | POA: Diagnosis not present

## 2021-03-10 DIAGNOSIS — K769 Liver disease, unspecified: Secondary | ICD-10-CM | POA: Diagnosis not present

## 2021-03-11 DIAGNOSIS — D509 Iron deficiency anemia, unspecified: Secondary | ICD-10-CM | POA: Diagnosis not present

## 2021-03-11 DIAGNOSIS — Z992 Dependence on renal dialysis: Secondary | ICD-10-CM | POA: Diagnosis not present

## 2021-03-11 DIAGNOSIS — N2581 Secondary hyperparathyroidism of renal origin: Secondary | ICD-10-CM | POA: Diagnosis not present

## 2021-03-11 DIAGNOSIS — E440 Moderate protein-calorie malnutrition: Secondary | ICD-10-CM | POA: Diagnosis not present

## 2021-03-11 DIAGNOSIS — Z79899 Other long term (current) drug therapy: Secondary | ICD-10-CM | POA: Diagnosis not present

## 2021-03-11 DIAGNOSIS — N186 End stage renal disease: Secondary | ICD-10-CM | POA: Diagnosis not present

## 2021-03-11 DIAGNOSIS — R82998 Other abnormal findings in urine: Secondary | ICD-10-CM | POA: Diagnosis not present

## 2021-03-11 DIAGNOSIS — D631 Anemia in chronic kidney disease: Secondary | ICD-10-CM | POA: Diagnosis not present

## 2021-03-11 DIAGNOSIS — K769 Liver disease, unspecified: Secondary | ICD-10-CM | POA: Diagnosis not present

## 2021-03-12 DIAGNOSIS — N2581 Secondary hyperparathyroidism of renal origin: Secondary | ICD-10-CM | POA: Diagnosis not present

## 2021-03-12 DIAGNOSIS — K769 Liver disease, unspecified: Secondary | ICD-10-CM | POA: Diagnosis not present

## 2021-03-12 DIAGNOSIS — E440 Moderate protein-calorie malnutrition: Secondary | ICD-10-CM | POA: Diagnosis not present

## 2021-03-12 DIAGNOSIS — Z992 Dependence on renal dialysis: Secondary | ICD-10-CM | POA: Diagnosis not present

## 2021-03-12 DIAGNOSIS — D631 Anemia in chronic kidney disease: Secondary | ICD-10-CM | POA: Diagnosis not present

## 2021-03-12 DIAGNOSIS — D509 Iron deficiency anemia, unspecified: Secondary | ICD-10-CM | POA: Diagnosis not present

## 2021-03-12 DIAGNOSIS — Z79899 Other long term (current) drug therapy: Secondary | ICD-10-CM | POA: Diagnosis not present

## 2021-03-12 DIAGNOSIS — N186 End stage renal disease: Secondary | ICD-10-CM | POA: Diagnosis not present

## 2021-03-12 DIAGNOSIS — R82998 Other abnormal findings in urine: Secondary | ICD-10-CM | POA: Diagnosis not present

## 2021-03-13 DIAGNOSIS — Z992 Dependence on renal dialysis: Secondary | ICD-10-CM | POA: Diagnosis not present

## 2021-03-13 DIAGNOSIS — K769 Liver disease, unspecified: Secondary | ICD-10-CM | POA: Diagnosis not present

## 2021-03-13 DIAGNOSIS — R82998 Other abnormal findings in urine: Secondary | ICD-10-CM | POA: Diagnosis not present

## 2021-03-13 DIAGNOSIS — D631 Anemia in chronic kidney disease: Secondary | ICD-10-CM | POA: Diagnosis not present

## 2021-03-13 DIAGNOSIS — D509 Iron deficiency anemia, unspecified: Secondary | ICD-10-CM | POA: Diagnosis not present

## 2021-03-13 DIAGNOSIS — N2581 Secondary hyperparathyroidism of renal origin: Secondary | ICD-10-CM | POA: Diagnosis not present

## 2021-03-13 DIAGNOSIS — Z79899 Other long term (current) drug therapy: Secondary | ICD-10-CM | POA: Diagnosis not present

## 2021-03-13 DIAGNOSIS — N186 End stage renal disease: Secondary | ICD-10-CM | POA: Diagnosis not present

## 2021-03-13 DIAGNOSIS — E440 Moderate protein-calorie malnutrition: Secondary | ICD-10-CM | POA: Diagnosis not present

## 2021-03-14 DIAGNOSIS — E440 Moderate protein-calorie malnutrition: Secondary | ICD-10-CM | POA: Diagnosis not present

## 2021-03-14 DIAGNOSIS — R82998 Other abnormal findings in urine: Secondary | ICD-10-CM | POA: Diagnosis not present

## 2021-03-14 DIAGNOSIS — N186 End stage renal disease: Secondary | ICD-10-CM | POA: Diagnosis not present

## 2021-03-14 DIAGNOSIS — K769 Liver disease, unspecified: Secondary | ICD-10-CM | POA: Diagnosis not present

## 2021-03-14 DIAGNOSIS — N2581 Secondary hyperparathyroidism of renal origin: Secondary | ICD-10-CM | POA: Diagnosis not present

## 2021-03-14 DIAGNOSIS — Z992 Dependence on renal dialysis: Secondary | ICD-10-CM | POA: Diagnosis not present

## 2021-03-14 DIAGNOSIS — D631 Anemia in chronic kidney disease: Secondary | ICD-10-CM | POA: Diagnosis not present

## 2021-03-14 DIAGNOSIS — D509 Iron deficiency anemia, unspecified: Secondary | ICD-10-CM | POA: Diagnosis not present

## 2021-03-14 DIAGNOSIS — Z79899 Other long term (current) drug therapy: Secondary | ICD-10-CM | POA: Diagnosis not present

## 2021-03-15 DIAGNOSIS — D509 Iron deficiency anemia, unspecified: Secondary | ICD-10-CM | POA: Diagnosis not present

## 2021-03-15 DIAGNOSIS — D631 Anemia in chronic kidney disease: Secondary | ICD-10-CM | POA: Diagnosis not present

## 2021-03-15 DIAGNOSIS — N186 End stage renal disease: Secondary | ICD-10-CM | POA: Diagnosis not present

## 2021-03-15 DIAGNOSIS — Z992 Dependence on renal dialysis: Secondary | ICD-10-CM | POA: Diagnosis not present

## 2021-03-15 DIAGNOSIS — N2581 Secondary hyperparathyroidism of renal origin: Secondary | ICD-10-CM | POA: Diagnosis not present

## 2021-03-15 DIAGNOSIS — K769 Liver disease, unspecified: Secondary | ICD-10-CM | POA: Diagnosis not present

## 2021-03-15 DIAGNOSIS — E440 Moderate protein-calorie malnutrition: Secondary | ICD-10-CM | POA: Diagnosis not present

## 2021-03-15 DIAGNOSIS — R82998 Other abnormal findings in urine: Secondary | ICD-10-CM | POA: Diagnosis not present

## 2021-03-15 DIAGNOSIS — Z79899 Other long term (current) drug therapy: Secondary | ICD-10-CM | POA: Diagnosis not present

## 2021-03-16 DIAGNOSIS — D509 Iron deficiency anemia, unspecified: Secondary | ICD-10-CM | POA: Diagnosis not present

## 2021-03-16 DIAGNOSIS — N186 End stage renal disease: Secondary | ICD-10-CM | POA: Diagnosis not present

## 2021-03-16 DIAGNOSIS — Z992 Dependence on renal dialysis: Secondary | ICD-10-CM | POA: Diagnosis not present

## 2021-03-16 DIAGNOSIS — N2581 Secondary hyperparathyroidism of renal origin: Secondary | ICD-10-CM | POA: Diagnosis not present

## 2021-03-16 DIAGNOSIS — Z79899 Other long term (current) drug therapy: Secondary | ICD-10-CM | POA: Diagnosis not present

## 2021-03-16 DIAGNOSIS — E440 Moderate protein-calorie malnutrition: Secondary | ICD-10-CM | POA: Diagnosis not present

## 2021-03-16 DIAGNOSIS — K769 Liver disease, unspecified: Secondary | ICD-10-CM | POA: Diagnosis not present

## 2021-03-16 DIAGNOSIS — R82998 Other abnormal findings in urine: Secondary | ICD-10-CM | POA: Diagnosis not present

## 2021-03-16 DIAGNOSIS — D631 Anemia in chronic kidney disease: Secondary | ICD-10-CM | POA: Diagnosis not present

## 2021-03-17 ENCOUNTER — Ambulatory Visit: Payer: Medicare Other | Admitting: Internal Medicine

## 2021-03-17 DIAGNOSIS — Z79899 Other long term (current) drug therapy: Secondary | ICD-10-CM | POA: Diagnosis not present

## 2021-03-17 DIAGNOSIS — Z992 Dependence on renal dialysis: Secondary | ICD-10-CM | POA: Diagnosis not present

## 2021-03-17 DIAGNOSIS — E440 Moderate protein-calorie malnutrition: Secondary | ICD-10-CM | POA: Diagnosis not present

## 2021-03-17 DIAGNOSIS — R82998 Other abnormal findings in urine: Secondary | ICD-10-CM | POA: Diagnosis not present

## 2021-03-17 DIAGNOSIS — N186 End stage renal disease: Secondary | ICD-10-CM | POA: Diagnosis not present

## 2021-03-17 DIAGNOSIS — D509 Iron deficiency anemia, unspecified: Secondary | ICD-10-CM | POA: Diagnosis not present

## 2021-03-17 DIAGNOSIS — D631 Anemia in chronic kidney disease: Secondary | ICD-10-CM | POA: Diagnosis not present

## 2021-03-17 DIAGNOSIS — K769 Liver disease, unspecified: Secondary | ICD-10-CM | POA: Diagnosis not present

## 2021-03-17 DIAGNOSIS — N2581 Secondary hyperparathyroidism of renal origin: Secondary | ICD-10-CM | POA: Diagnosis not present

## 2021-03-18 DIAGNOSIS — R82998 Other abnormal findings in urine: Secondary | ICD-10-CM | POA: Diagnosis not present

## 2021-03-18 DIAGNOSIS — D509 Iron deficiency anemia, unspecified: Secondary | ICD-10-CM | POA: Diagnosis not present

## 2021-03-18 DIAGNOSIS — E440 Moderate protein-calorie malnutrition: Secondary | ICD-10-CM | POA: Diagnosis not present

## 2021-03-18 DIAGNOSIS — N2581 Secondary hyperparathyroidism of renal origin: Secondary | ICD-10-CM | POA: Diagnosis not present

## 2021-03-18 DIAGNOSIS — D631 Anemia in chronic kidney disease: Secondary | ICD-10-CM | POA: Diagnosis not present

## 2021-03-18 DIAGNOSIS — Z992 Dependence on renal dialysis: Secondary | ICD-10-CM | POA: Diagnosis not present

## 2021-03-18 DIAGNOSIS — K769 Liver disease, unspecified: Secondary | ICD-10-CM | POA: Diagnosis not present

## 2021-03-18 DIAGNOSIS — N186 End stage renal disease: Secondary | ICD-10-CM | POA: Diagnosis not present

## 2021-03-18 DIAGNOSIS — Z79899 Other long term (current) drug therapy: Secondary | ICD-10-CM | POA: Diagnosis not present

## 2021-03-19 DIAGNOSIS — E440 Moderate protein-calorie malnutrition: Secondary | ICD-10-CM | POA: Diagnosis not present

## 2021-03-19 DIAGNOSIS — Z79899 Other long term (current) drug therapy: Secondary | ICD-10-CM | POA: Diagnosis not present

## 2021-03-19 DIAGNOSIS — K769 Liver disease, unspecified: Secondary | ICD-10-CM | POA: Diagnosis not present

## 2021-03-19 DIAGNOSIS — R82998 Other abnormal findings in urine: Secondary | ICD-10-CM | POA: Diagnosis not present

## 2021-03-19 DIAGNOSIS — N186 End stage renal disease: Secondary | ICD-10-CM | POA: Diagnosis not present

## 2021-03-19 DIAGNOSIS — Z992 Dependence on renal dialysis: Secondary | ICD-10-CM | POA: Diagnosis not present

## 2021-03-19 DIAGNOSIS — I129 Hypertensive chronic kidney disease with stage 1 through stage 4 chronic kidney disease, or unspecified chronic kidney disease: Secondary | ICD-10-CM | POA: Diagnosis not present

## 2021-03-19 DIAGNOSIS — N2581 Secondary hyperparathyroidism of renal origin: Secondary | ICD-10-CM | POA: Diagnosis not present

## 2021-03-19 DIAGNOSIS — D631 Anemia in chronic kidney disease: Secondary | ICD-10-CM | POA: Diagnosis not present

## 2021-03-19 DIAGNOSIS — D509 Iron deficiency anemia, unspecified: Secondary | ICD-10-CM | POA: Diagnosis not present

## 2021-03-20 DIAGNOSIS — N2581 Secondary hyperparathyroidism of renal origin: Secondary | ICD-10-CM | POA: Diagnosis not present

## 2021-03-20 DIAGNOSIS — K769 Liver disease, unspecified: Secondary | ICD-10-CM | POA: Diagnosis not present

## 2021-03-20 DIAGNOSIS — N186 End stage renal disease: Secondary | ICD-10-CM | POA: Diagnosis not present

## 2021-03-20 DIAGNOSIS — D509 Iron deficiency anemia, unspecified: Secondary | ICD-10-CM | POA: Diagnosis not present

## 2021-03-20 DIAGNOSIS — N2589 Other disorders resulting from impaired renal tubular function: Secondary | ICD-10-CM | POA: Diagnosis not present

## 2021-03-20 DIAGNOSIS — D631 Anemia in chronic kidney disease: Secondary | ICD-10-CM | POA: Diagnosis not present

## 2021-03-20 DIAGNOSIS — Z4932 Encounter for adequacy testing for peritoneal dialysis: Secondary | ICD-10-CM | POA: Diagnosis not present

## 2021-03-20 DIAGNOSIS — R82998 Other abnormal findings in urine: Secondary | ICD-10-CM | POA: Diagnosis not present

## 2021-03-20 DIAGNOSIS — Z992 Dependence on renal dialysis: Secondary | ICD-10-CM | POA: Diagnosis not present

## 2021-03-21 DIAGNOSIS — Z4932 Encounter for adequacy testing for peritoneal dialysis: Secondary | ICD-10-CM | POA: Diagnosis not present

## 2021-03-21 DIAGNOSIS — N186 End stage renal disease: Secondary | ICD-10-CM | POA: Diagnosis not present

## 2021-03-21 DIAGNOSIS — D631 Anemia in chronic kidney disease: Secondary | ICD-10-CM | POA: Diagnosis not present

## 2021-03-21 DIAGNOSIS — K769 Liver disease, unspecified: Secondary | ICD-10-CM | POA: Diagnosis not present

## 2021-03-21 DIAGNOSIS — Z992 Dependence on renal dialysis: Secondary | ICD-10-CM | POA: Diagnosis not present

## 2021-03-21 DIAGNOSIS — D509 Iron deficiency anemia, unspecified: Secondary | ICD-10-CM | POA: Diagnosis not present

## 2021-03-21 DIAGNOSIS — N2581 Secondary hyperparathyroidism of renal origin: Secondary | ICD-10-CM | POA: Diagnosis not present

## 2021-03-21 DIAGNOSIS — N2589 Other disorders resulting from impaired renal tubular function: Secondary | ICD-10-CM | POA: Diagnosis not present

## 2021-03-21 DIAGNOSIS — R82998 Other abnormal findings in urine: Secondary | ICD-10-CM | POA: Diagnosis not present

## 2021-03-22 DIAGNOSIS — N2589 Other disorders resulting from impaired renal tubular function: Secondary | ICD-10-CM | POA: Diagnosis not present

## 2021-03-22 DIAGNOSIS — Z4932 Encounter for adequacy testing for peritoneal dialysis: Secondary | ICD-10-CM | POA: Diagnosis not present

## 2021-03-22 DIAGNOSIS — K769 Liver disease, unspecified: Secondary | ICD-10-CM | POA: Diagnosis not present

## 2021-03-22 DIAGNOSIS — N186 End stage renal disease: Secondary | ICD-10-CM | POA: Diagnosis not present

## 2021-03-22 DIAGNOSIS — R82998 Other abnormal findings in urine: Secondary | ICD-10-CM | POA: Diagnosis not present

## 2021-03-22 DIAGNOSIS — N2581 Secondary hyperparathyroidism of renal origin: Secondary | ICD-10-CM | POA: Diagnosis not present

## 2021-03-22 DIAGNOSIS — Z992 Dependence on renal dialysis: Secondary | ICD-10-CM | POA: Diagnosis not present

## 2021-03-22 DIAGNOSIS — D509 Iron deficiency anemia, unspecified: Secondary | ICD-10-CM | POA: Diagnosis not present

## 2021-03-22 DIAGNOSIS — D631 Anemia in chronic kidney disease: Secondary | ICD-10-CM | POA: Diagnosis not present

## 2021-03-23 DIAGNOSIS — N186 End stage renal disease: Secondary | ICD-10-CM | POA: Diagnosis not present

## 2021-03-23 DIAGNOSIS — K769 Liver disease, unspecified: Secondary | ICD-10-CM | POA: Diagnosis not present

## 2021-03-23 DIAGNOSIS — D509 Iron deficiency anemia, unspecified: Secondary | ICD-10-CM | POA: Diagnosis not present

## 2021-03-23 DIAGNOSIS — Z4932 Encounter for adequacy testing for peritoneal dialysis: Secondary | ICD-10-CM | POA: Diagnosis not present

## 2021-03-23 DIAGNOSIS — D631 Anemia in chronic kidney disease: Secondary | ICD-10-CM | POA: Diagnosis not present

## 2021-03-23 DIAGNOSIS — N2589 Other disorders resulting from impaired renal tubular function: Secondary | ICD-10-CM | POA: Diagnosis not present

## 2021-03-23 DIAGNOSIS — Z992 Dependence on renal dialysis: Secondary | ICD-10-CM | POA: Diagnosis not present

## 2021-03-23 DIAGNOSIS — N2581 Secondary hyperparathyroidism of renal origin: Secondary | ICD-10-CM | POA: Diagnosis not present

## 2021-03-23 DIAGNOSIS — R82998 Other abnormal findings in urine: Secondary | ICD-10-CM | POA: Diagnosis not present

## 2021-03-24 DIAGNOSIS — K769 Liver disease, unspecified: Secondary | ICD-10-CM | POA: Diagnosis not present

## 2021-03-24 DIAGNOSIS — N186 End stage renal disease: Secondary | ICD-10-CM | POA: Diagnosis not present

## 2021-03-24 DIAGNOSIS — R82998 Other abnormal findings in urine: Secondary | ICD-10-CM | POA: Diagnosis not present

## 2021-03-24 DIAGNOSIS — Z992 Dependence on renal dialysis: Secondary | ICD-10-CM | POA: Diagnosis not present

## 2021-03-24 DIAGNOSIS — N2589 Other disorders resulting from impaired renal tubular function: Secondary | ICD-10-CM | POA: Diagnosis not present

## 2021-03-24 DIAGNOSIS — D509 Iron deficiency anemia, unspecified: Secondary | ICD-10-CM | POA: Diagnosis not present

## 2021-03-24 DIAGNOSIS — N2581 Secondary hyperparathyroidism of renal origin: Secondary | ICD-10-CM | POA: Diagnosis not present

## 2021-03-24 DIAGNOSIS — Z4932 Encounter for adequacy testing for peritoneal dialysis: Secondary | ICD-10-CM | POA: Diagnosis not present

## 2021-03-24 DIAGNOSIS — D631 Anemia in chronic kidney disease: Secondary | ICD-10-CM | POA: Diagnosis not present

## 2021-03-25 DIAGNOSIS — D509 Iron deficiency anemia, unspecified: Secondary | ICD-10-CM | POA: Diagnosis not present

## 2021-03-25 DIAGNOSIS — N186 End stage renal disease: Secondary | ICD-10-CM | POA: Diagnosis not present

## 2021-03-25 DIAGNOSIS — R82998 Other abnormal findings in urine: Secondary | ICD-10-CM | POA: Diagnosis not present

## 2021-03-25 DIAGNOSIS — D631 Anemia in chronic kidney disease: Secondary | ICD-10-CM | POA: Diagnosis not present

## 2021-03-25 DIAGNOSIS — K769 Liver disease, unspecified: Secondary | ICD-10-CM | POA: Diagnosis not present

## 2021-03-25 DIAGNOSIS — Z992 Dependence on renal dialysis: Secondary | ICD-10-CM | POA: Diagnosis not present

## 2021-03-25 DIAGNOSIS — N2581 Secondary hyperparathyroidism of renal origin: Secondary | ICD-10-CM | POA: Diagnosis not present

## 2021-03-25 DIAGNOSIS — Z4932 Encounter for adequacy testing for peritoneal dialysis: Secondary | ICD-10-CM | POA: Diagnosis not present

## 2021-03-25 DIAGNOSIS — N2589 Other disorders resulting from impaired renal tubular function: Secondary | ICD-10-CM | POA: Diagnosis not present

## 2021-03-26 DIAGNOSIS — Z4932 Encounter for adequacy testing for peritoneal dialysis: Secondary | ICD-10-CM | POA: Diagnosis not present

## 2021-03-26 DIAGNOSIS — D631 Anemia in chronic kidney disease: Secondary | ICD-10-CM | POA: Diagnosis not present

## 2021-03-26 DIAGNOSIS — D509 Iron deficiency anemia, unspecified: Secondary | ICD-10-CM | POA: Diagnosis not present

## 2021-03-26 DIAGNOSIS — N2589 Other disorders resulting from impaired renal tubular function: Secondary | ICD-10-CM | POA: Diagnosis not present

## 2021-03-26 DIAGNOSIS — R82998 Other abnormal findings in urine: Secondary | ICD-10-CM | POA: Diagnosis not present

## 2021-03-26 DIAGNOSIS — N186 End stage renal disease: Secondary | ICD-10-CM | POA: Diagnosis not present

## 2021-03-26 DIAGNOSIS — N2581 Secondary hyperparathyroidism of renal origin: Secondary | ICD-10-CM | POA: Diagnosis not present

## 2021-03-26 DIAGNOSIS — Z992 Dependence on renal dialysis: Secondary | ICD-10-CM | POA: Diagnosis not present

## 2021-03-26 DIAGNOSIS — K769 Liver disease, unspecified: Secondary | ICD-10-CM | POA: Diagnosis not present

## 2021-03-27 DIAGNOSIS — D509 Iron deficiency anemia, unspecified: Secondary | ICD-10-CM | POA: Diagnosis not present

## 2021-03-27 DIAGNOSIS — Z992 Dependence on renal dialysis: Secondary | ICD-10-CM | POA: Diagnosis not present

## 2021-03-27 DIAGNOSIS — Z4932 Encounter for adequacy testing for peritoneal dialysis: Secondary | ICD-10-CM | POA: Diagnosis not present

## 2021-03-27 DIAGNOSIS — N2589 Other disorders resulting from impaired renal tubular function: Secondary | ICD-10-CM | POA: Diagnosis not present

## 2021-03-27 DIAGNOSIS — N186 End stage renal disease: Secondary | ICD-10-CM | POA: Diagnosis not present

## 2021-03-27 DIAGNOSIS — N2581 Secondary hyperparathyroidism of renal origin: Secondary | ICD-10-CM | POA: Diagnosis not present

## 2021-03-27 DIAGNOSIS — D631 Anemia in chronic kidney disease: Secondary | ICD-10-CM | POA: Diagnosis not present

## 2021-03-27 DIAGNOSIS — R82998 Other abnormal findings in urine: Secondary | ICD-10-CM | POA: Diagnosis not present

## 2021-03-27 DIAGNOSIS — K769 Liver disease, unspecified: Secondary | ICD-10-CM | POA: Diagnosis not present

## 2021-03-28 DIAGNOSIS — D631 Anemia in chronic kidney disease: Secondary | ICD-10-CM | POA: Diagnosis not present

## 2021-03-28 DIAGNOSIS — Z992 Dependence on renal dialysis: Secondary | ICD-10-CM | POA: Diagnosis not present

## 2021-03-28 DIAGNOSIS — Z4932 Encounter for adequacy testing for peritoneal dialysis: Secondary | ICD-10-CM | POA: Diagnosis not present

## 2021-03-28 DIAGNOSIS — K769 Liver disease, unspecified: Secondary | ICD-10-CM | POA: Diagnosis not present

## 2021-03-28 DIAGNOSIS — D509 Iron deficiency anemia, unspecified: Secondary | ICD-10-CM | POA: Diagnosis not present

## 2021-03-28 DIAGNOSIS — N2581 Secondary hyperparathyroidism of renal origin: Secondary | ICD-10-CM | POA: Diagnosis not present

## 2021-03-28 DIAGNOSIS — R82998 Other abnormal findings in urine: Secondary | ICD-10-CM | POA: Diagnosis not present

## 2021-03-28 DIAGNOSIS — N2589 Other disorders resulting from impaired renal tubular function: Secondary | ICD-10-CM | POA: Diagnosis not present

## 2021-03-28 DIAGNOSIS — N186 End stage renal disease: Secondary | ICD-10-CM | POA: Diagnosis not present

## 2021-03-29 DIAGNOSIS — Z992 Dependence on renal dialysis: Secondary | ICD-10-CM | POA: Diagnosis not present

## 2021-03-29 DIAGNOSIS — N2589 Other disorders resulting from impaired renal tubular function: Secondary | ICD-10-CM | POA: Diagnosis not present

## 2021-03-29 DIAGNOSIS — D631 Anemia in chronic kidney disease: Secondary | ICD-10-CM | POA: Diagnosis not present

## 2021-03-29 DIAGNOSIS — Z4932 Encounter for adequacy testing for peritoneal dialysis: Secondary | ICD-10-CM | POA: Diagnosis not present

## 2021-03-29 DIAGNOSIS — N186 End stage renal disease: Secondary | ICD-10-CM | POA: Diagnosis not present

## 2021-03-29 DIAGNOSIS — D509 Iron deficiency anemia, unspecified: Secondary | ICD-10-CM | POA: Diagnosis not present

## 2021-03-29 DIAGNOSIS — K769 Liver disease, unspecified: Secondary | ICD-10-CM | POA: Diagnosis not present

## 2021-03-29 DIAGNOSIS — N2581 Secondary hyperparathyroidism of renal origin: Secondary | ICD-10-CM | POA: Diagnosis not present

## 2021-03-29 DIAGNOSIS — R82998 Other abnormal findings in urine: Secondary | ICD-10-CM | POA: Diagnosis not present

## 2021-03-30 DIAGNOSIS — Z992 Dependence on renal dialysis: Secondary | ICD-10-CM | POA: Diagnosis not present

## 2021-03-30 DIAGNOSIS — R82998 Other abnormal findings in urine: Secondary | ICD-10-CM | POA: Diagnosis not present

## 2021-03-30 DIAGNOSIS — N2581 Secondary hyperparathyroidism of renal origin: Secondary | ICD-10-CM | POA: Diagnosis not present

## 2021-03-30 DIAGNOSIS — K769 Liver disease, unspecified: Secondary | ICD-10-CM | POA: Diagnosis not present

## 2021-03-30 DIAGNOSIS — N2589 Other disorders resulting from impaired renal tubular function: Secondary | ICD-10-CM | POA: Diagnosis not present

## 2021-03-30 DIAGNOSIS — N186 End stage renal disease: Secondary | ICD-10-CM | POA: Diagnosis not present

## 2021-03-30 DIAGNOSIS — D509 Iron deficiency anemia, unspecified: Secondary | ICD-10-CM | POA: Diagnosis not present

## 2021-03-30 DIAGNOSIS — D631 Anemia in chronic kidney disease: Secondary | ICD-10-CM | POA: Diagnosis not present

## 2021-03-30 DIAGNOSIS — Z4932 Encounter for adequacy testing for peritoneal dialysis: Secondary | ICD-10-CM | POA: Diagnosis not present

## 2021-03-31 DIAGNOSIS — D631 Anemia in chronic kidney disease: Secondary | ICD-10-CM | POA: Diagnosis not present

## 2021-03-31 DIAGNOSIS — N186 End stage renal disease: Secondary | ICD-10-CM | POA: Diagnosis not present

## 2021-03-31 DIAGNOSIS — Z992 Dependence on renal dialysis: Secondary | ICD-10-CM | POA: Diagnosis not present

## 2021-03-31 DIAGNOSIS — N2589 Other disorders resulting from impaired renal tubular function: Secondary | ICD-10-CM | POA: Diagnosis not present

## 2021-03-31 DIAGNOSIS — K769 Liver disease, unspecified: Secondary | ICD-10-CM | POA: Diagnosis not present

## 2021-03-31 DIAGNOSIS — D509 Iron deficiency anemia, unspecified: Secondary | ICD-10-CM | POA: Diagnosis not present

## 2021-03-31 DIAGNOSIS — R82998 Other abnormal findings in urine: Secondary | ICD-10-CM | POA: Diagnosis not present

## 2021-03-31 DIAGNOSIS — Z4932 Encounter for adequacy testing for peritoneal dialysis: Secondary | ICD-10-CM | POA: Diagnosis not present

## 2021-03-31 DIAGNOSIS — N2581 Secondary hyperparathyroidism of renal origin: Secondary | ICD-10-CM | POA: Diagnosis not present

## 2021-04-01 DIAGNOSIS — N2581 Secondary hyperparathyroidism of renal origin: Secondary | ICD-10-CM | POA: Diagnosis not present

## 2021-04-01 DIAGNOSIS — Z4932 Encounter for adequacy testing for peritoneal dialysis: Secondary | ICD-10-CM | POA: Diagnosis not present

## 2021-04-01 DIAGNOSIS — N2589 Other disorders resulting from impaired renal tubular function: Secondary | ICD-10-CM | POA: Diagnosis not present

## 2021-04-01 DIAGNOSIS — D509 Iron deficiency anemia, unspecified: Secondary | ICD-10-CM | POA: Diagnosis not present

## 2021-04-01 DIAGNOSIS — Z992 Dependence on renal dialysis: Secondary | ICD-10-CM | POA: Diagnosis not present

## 2021-04-01 DIAGNOSIS — K769 Liver disease, unspecified: Secondary | ICD-10-CM | POA: Diagnosis not present

## 2021-04-01 DIAGNOSIS — D631 Anemia in chronic kidney disease: Secondary | ICD-10-CM | POA: Diagnosis not present

## 2021-04-01 DIAGNOSIS — N186 End stage renal disease: Secondary | ICD-10-CM | POA: Diagnosis not present

## 2021-04-01 DIAGNOSIS — R82998 Other abnormal findings in urine: Secondary | ICD-10-CM | POA: Diagnosis not present

## 2021-04-02 DIAGNOSIS — N2581 Secondary hyperparathyroidism of renal origin: Secondary | ICD-10-CM | POA: Diagnosis not present

## 2021-04-02 DIAGNOSIS — Z4932 Encounter for adequacy testing for peritoneal dialysis: Secondary | ICD-10-CM | POA: Diagnosis not present

## 2021-04-02 DIAGNOSIS — N2589 Other disorders resulting from impaired renal tubular function: Secondary | ICD-10-CM | POA: Diagnosis not present

## 2021-04-02 DIAGNOSIS — Z992 Dependence on renal dialysis: Secondary | ICD-10-CM | POA: Diagnosis not present

## 2021-04-02 DIAGNOSIS — R82998 Other abnormal findings in urine: Secondary | ICD-10-CM | POA: Diagnosis not present

## 2021-04-02 DIAGNOSIS — D631 Anemia in chronic kidney disease: Secondary | ICD-10-CM | POA: Diagnosis not present

## 2021-04-02 DIAGNOSIS — K769 Liver disease, unspecified: Secondary | ICD-10-CM | POA: Diagnosis not present

## 2021-04-02 DIAGNOSIS — N186 End stage renal disease: Secondary | ICD-10-CM | POA: Diagnosis not present

## 2021-04-02 DIAGNOSIS — D509 Iron deficiency anemia, unspecified: Secondary | ICD-10-CM | POA: Diagnosis not present

## 2021-04-03 DIAGNOSIS — N2581 Secondary hyperparathyroidism of renal origin: Secondary | ICD-10-CM | POA: Diagnosis not present

## 2021-04-03 DIAGNOSIS — Z992 Dependence on renal dialysis: Secondary | ICD-10-CM | POA: Diagnosis not present

## 2021-04-03 DIAGNOSIS — N186 End stage renal disease: Secondary | ICD-10-CM | POA: Diagnosis not present

## 2021-04-03 DIAGNOSIS — R82998 Other abnormal findings in urine: Secondary | ICD-10-CM | POA: Diagnosis not present

## 2021-04-03 DIAGNOSIS — N2589 Other disorders resulting from impaired renal tubular function: Secondary | ICD-10-CM | POA: Diagnosis not present

## 2021-04-03 DIAGNOSIS — D631 Anemia in chronic kidney disease: Secondary | ICD-10-CM | POA: Diagnosis not present

## 2021-04-03 DIAGNOSIS — Z4932 Encounter for adequacy testing for peritoneal dialysis: Secondary | ICD-10-CM | POA: Diagnosis not present

## 2021-04-03 DIAGNOSIS — D509 Iron deficiency anemia, unspecified: Secondary | ICD-10-CM | POA: Diagnosis not present

## 2021-04-03 DIAGNOSIS — K769 Liver disease, unspecified: Secondary | ICD-10-CM | POA: Diagnosis not present

## 2021-04-04 DIAGNOSIS — R82998 Other abnormal findings in urine: Secondary | ICD-10-CM | POA: Diagnosis not present

## 2021-04-04 DIAGNOSIS — N2581 Secondary hyperparathyroidism of renal origin: Secondary | ICD-10-CM | POA: Diagnosis not present

## 2021-04-04 DIAGNOSIS — K769 Liver disease, unspecified: Secondary | ICD-10-CM | POA: Diagnosis not present

## 2021-04-04 DIAGNOSIS — Z4932 Encounter for adequacy testing for peritoneal dialysis: Secondary | ICD-10-CM | POA: Diagnosis not present

## 2021-04-04 DIAGNOSIS — Z992 Dependence on renal dialysis: Secondary | ICD-10-CM | POA: Diagnosis not present

## 2021-04-04 DIAGNOSIS — D631 Anemia in chronic kidney disease: Secondary | ICD-10-CM | POA: Diagnosis not present

## 2021-04-04 DIAGNOSIS — N186 End stage renal disease: Secondary | ICD-10-CM | POA: Diagnosis not present

## 2021-04-04 DIAGNOSIS — D509 Iron deficiency anemia, unspecified: Secondary | ICD-10-CM | POA: Diagnosis not present

## 2021-04-04 DIAGNOSIS — N2589 Other disorders resulting from impaired renal tubular function: Secondary | ICD-10-CM | POA: Diagnosis not present

## 2021-04-05 DIAGNOSIS — N2589 Other disorders resulting from impaired renal tubular function: Secondary | ICD-10-CM | POA: Diagnosis not present

## 2021-04-05 DIAGNOSIS — N186 End stage renal disease: Secondary | ICD-10-CM | POA: Diagnosis not present

## 2021-04-05 DIAGNOSIS — K769 Liver disease, unspecified: Secondary | ICD-10-CM | POA: Diagnosis not present

## 2021-04-05 DIAGNOSIS — Z992 Dependence on renal dialysis: Secondary | ICD-10-CM | POA: Diagnosis not present

## 2021-04-05 DIAGNOSIS — D631 Anemia in chronic kidney disease: Secondary | ICD-10-CM | POA: Diagnosis not present

## 2021-04-05 DIAGNOSIS — N2581 Secondary hyperparathyroidism of renal origin: Secondary | ICD-10-CM | POA: Diagnosis not present

## 2021-04-05 DIAGNOSIS — Z4932 Encounter for adequacy testing for peritoneal dialysis: Secondary | ICD-10-CM | POA: Diagnosis not present

## 2021-04-05 DIAGNOSIS — D509 Iron deficiency anemia, unspecified: Secondary | ICD-10-CM | POA: Diagnosis not present

## 2021-04-05 DIAGNOSIS — R82998 Other abnormal findings in urine: Secondary | ICD-10-CM | POA: Diagnosis not present

## 2021-04-06 DIAGNOSIS — R82998 Other abnormal findings in urine: Secondary | ICD-10-CM | POA: Diagnosis not present

## 2021-04-06 DIAGNOSIS — N186 End stage renal disease: Secondary | ICD-10-CM | POA: Diagnosis not present

## 2021-04-06 DIAGNOSIS — D631 Anemia in chronic kidney disease: Secondary | ICD-10-CM | POA: Diagnosis not present

## 2021-04-06 DIAGNOSIS — Z992 Dependence on renal dialysis: Secondary | ICD-10-CM | POA: Diagnosis not present

## 2021-04-06 DIAGNOSIS — N2581 Secondary hyperparathyroidism of renal origin: Secondary | ICD-10-CM | POA: Diagnosis not present

## 2021-04-06 DIAGNOSIS — K769 Liver disease, unspecified: Secondary | ICD-10-CM | POA: Diagnosis not present

## 2021-04-06 DIAGNOSIS — D509 Iron deficiency anemia, unspecified: Secondary | ICD-10-CM | POA: Diagnosis not present

## 2021-04-06 DIAGNOSIS — Z4932 Encounter for adequacy testing for peritoneal dialysis: Secondary | ICD-10-CM | POA: Diagnosis not present

## 2021-04-06 DIAGNOSIS — N2589 Other disorders resulting from impaired renal tubular function: Secondary | ICD-10-CM | POA: Diagnosis not present

## 2021-04-07 DIAGNOSIS — R82998 Other abnormal findings in urine: Secondary | ICD-10-CM | POA: Diagnosis not present

## 2021-04-07 DIAGNOSIS — N2589 Other disorders resulting from impaired renal tubular function: Secondary | ICD-10-CM | POA: Diagnosis not present

## 2021-04-07 DIAGNOSIS — D631 Anemia in chronic kidney disease: Secondary | ICD-10-CM | POA: Diagnosis not present

## 2021-04-07 DIAGNOSIS — Z4932 Encounter for adequacy testing for peritoneal dialysis: Secondary | ICD-10-CM | POA: Diagnosis not present

## 2021-04-07 DIAGNOSIS — D509 Iron deficiency anemia, unspecified: Secondary | ICD-10-CM | POA: Diagnosis not present

## 2021-04-07 DIAGNOSIS — N186 End stage renal disease: Secondary | ICD-10-CM | POA: Diagnosis not present

## 2021-04-07 DIAGNOSIS — N2581 Secondary hyperparathyroidism of renal origin: Secondary | ICD-10-CM | POA: Diagnosis not present

## 2021-04-07 DIAGNOSIS — Z992 Dependence on renal dialysis: Secondary | ICD-10-CM | POA: Diagnosis not present

## 2021-04-07 DIAGNOSIS — K769 Liver disease, unspecified: Secondary | ICD-10-CM | POA: Diagnosis not present

## 2021-04-08 DIAGNOSIS — D631 Anemia in chronic kidney disease: Secondary | ICD-10-CM | POA: Diagnosis not present

## 2021-04-08 DIAGNOSIS — Z992 Dependence on renal dialysis: Secondary | ICD-10-CM | POA: Diagnosis not present

## 2021-04-08 DIAGNOSIS — N2589 Other disorders resulting from impaired renal tubular function: Secondary | ICD-10-CM | POA: Diagnosis not present

## 2021-04-08 DIAGNOSIS — N186 End stage renal disease: Secondary | ICD-10-CM | POA: Diagnosis not present

## 2021-04-08 DIAGNOSIS — N2581 Secondary hyperparathyroidism of renal origin: Secondary | ICD-10-CM | POA: Diagnosis not present

## 2021-04-08 DIAGNOSIS — K769 Liver disease, unspecified: Secondary | ICD-10-CM | POA: Diagnosis not present

## 2021-04-08 DIAGNOSIS — R82998 Other abnormal findings in urine: Secondary | ICD-10-CM | POA: Diagnosis not present

## 2021-04-08 DIAGNOSIS — Z4932 Encounter for adequacy testing for peritoneal dialysis: Secondary | ICD-10-CM | POA: Diagnosis not present

## 2021-04-08 DIAGNOSIS — D509 Iron deficiency anemia, unspecified: Secondary | ICD-10-CM | POA: Diagnosis not present

## 2021-04-09 DIAGNOSIS — R82998 Other abnormal findings in urine: Secondary | ICD-10-CM | POA: Diagnosis not present

## 2021-04-09 DIAGNOSIS — Z992 Dependence on renal dialysis: Secondary | ICD-10-CM | POA: Diagnosis not present

## 2021-04-09 DIAGNOSIS — D631 Anemia in chronic kidney disease: Secondary | ICD-10-CM | POA: Diagnosis not present

## 2021-04-09 DIAGNOSIS — Z4932 Encounter for adequacy testing for peritoneal dialysis: Secondary | ICD-10-CM | POA: Diagnosis not present

## 2021-04-09 DIAGNOSIS — K769 Liver disease, unspecified: Secondary | ICD-10-CM | POA: Diagnosis not present

## 2021-04-09 DIAGNOSIS — D509 Iron deficiency anemia, unspecified: Secondary | ICD-10-CM | POA: Diagnosis not present

## 2021-04-09 DIAGNOSIS — N186 End stage renal disease: Secondary | ICD-10-CM | POA: Diagnosis not present

## 2021-04-09 DIAGNOSIS — N2581 Secondary hyperparathyroidism of renal origin: Secondary | ICD-10-CM | POA: Diagnosis not present

## 2021-04-09 DIAGNOSIS — N2589 Other disorders resulting from impaired renal tubular function: Secondary | ICD-10-CM | POA: Diagnosis not present

## 2021-04-10 DIAGNOSIS — N2589 Other disorders resulting from impaired renal tubular function: Secondary | ICD-10-CM | POA: Diagnosis not present

## 2021-04-10 DIAGNOSIS — K769 Liver disease, unspecified: Secondary | ICD-10-CM | POA: Diagnosis not present

## 2021-04-10 DIAGNOSIS — N186 End stage renal disease: Secondary | ICD-10-CM | POA: Diagnosis not present

## 2021-04-10 DIAGNOSIS — Z4932 Encounter for adequacy testing for peritoneal dialysis: Secondary | ICD-10-CM | POA: Diagnosis not present

## 2021-04-10 DIAGNOSIS — D509 Iron deficiency anemia, unspecified: Secondary | ICD-10-CM | POA: Diagnosis not present

## 2021-04-10 DIAGNOSIS — R82998 Other abnormal findings in urine: Secondary | ICD-10-CM | POA: Diagnosis not present

## 2021-04-10 DIAGNOSIS — D631 Anemia in chronic kidney disease: Secondary | ICD-10-CM | POA: Diagnosis not present

## 2021-04-10 DIAGNOSIS — Z992 Dependence on renal dialysis: Secondary | ICD-10-CM | POA: Diagnosis not present

## 2021-04-10 DIAGNOSIS — N2581 Secondary hyperparathyroidism of renal origin: Secondary | ICD-10-CM | POA: Diagnosis not present

## 2021-04-11 DIAGNOSIS — N2589 Other disorders resulting from impaired renal tubular function: Secondary | ICD-10-CM | POA: Diagnosis not present

## 2021-04-11 DIAGNOSIS — D631 Anemia in chronic kidney disease: Secondary | ICD-10-CM | POA: Diagnosis not present

## 2021-04-11 DIAGNOSIS — R82998 Other abnormal findings in urine: Secondary | ICD-10-CM | POA: Diagnosis not present

## 2021-04-11 DIAGNOSIS — K769 Liver disease, unspecified: Secondary | ICD-10-CM | POA: Diagnosis not present

## 2021-04-11 DIAGNOSIS — D509 Iron deficiency anemia, unspecified: Secondary | ICD-10-CM | POA: Diagnosis not present

## 2021-04-11 DIAGNOSIS — N2581 Secondary hyperparathyroidism of renal origin: Secondary | ICD-10-CM | POA: Diagnosis not present

## 2021-04-11 DIAGNOSIS — N186 End stage renal disease: Secondary | ICD-10-CM | POA: Diagnosis not present

## 2021-04-11 DIAGNOSIS — Z4932 Encounter for adequacy testing for peritoneal dialysis: Secondary | ICD-10-CM | POA: Diagnosis not present

## 2021-04-11 DIAGNOSIS — Z992 Dependence on renal dialysis: Secondary | ICD-10-CM | POA: Diagnosis not present

## 2021-04-12 DIAGNOSIS — N2581 Secondary hyperparathyroidism of renal origin: Secondary | ICD-10-CM | POA: Diagnosis not present

## 2021-04-12 DIAGNOSIS — Z4932 Encounter for adequacy testing for peritoneal dialysis: Secondary | ICD-10-CM | POA: Diagnosis not present

## 2021-04-12 DIAGNOSIS — D631 Anemia in chronic kidney disease: Secondary | ICD-10-CM | POA: Diagnosis not present

## 2021-04-12 DIAGNOSIS — N2589 Other disorders resulting from impaired renal tubular function: Secondary | ICD-10-CM | POA: Diagnosis not present

## 2021-04-12 DIAGNOSIS — R82998 Other abnormal findings in urine: Secondary | ICD-10-CM | POA: Diagnosis not present

## 2021-04-12 DIAGNOSIS — D509 Iron deficiency anemia, unspecified: Secondary | ICD-10-CM | POA: Diagnosis not present

## 2021-04-12 DIAGNOSIS — K769 Liver disease, unspecified: Secondary | ICD-10-CM | POA: Diagnosis not present

## 2021-04-12 DIAGNOSIS — N186 End stage renal disease: Secondary | ICD-10-CM | POA: Diagnosis not present

## 2021-04-12 DIAGNOSIS — Z992 Dependence on renal dialysis: Secondary | ICD-10-CM | POA: Diagnosis not present

## 2021-04-13 DIAGNOSIS — R82998 Other abnormal findings in urine: Secondary | ICD-10-CM | POA: Diagnosis not present

## 2021-04-13 DIAGNOSIS — Z992 Dependence on renal dialysis: Secondary | ICD-10-CM | POA: Diagnosis not present

## 2021-04-13 DIAGNOSIS — N2589 Other disorders resulting from impaired renal tubular function: Secondary | ICD-10-CM | POA: Diagnosis not present

## 2021-04-13 DIAGNOSIS — D509 Iron deficiency anemia, unspecified: Secondary | ICD-10-CM | POA: Diagnosis not present

## 2021-04-13 DIAGNOSIS — Z4932 Encounter for adequacy testing for peritoneal dialysis: Secondary | ICD-10-CM | POA: Diagnosis not present

## 2021-04-13 DIAGNOSIS — N186 End stage renal disease: Secondary | ICD-10-CM | POA: Diagnosis not present

## 2021-04-13 DIAGNOSIS — D631 Anemia in chronic kidney disease: Secondary | ICD-10-CM | POA: Diagnosis not present

## 2021-04-13 DIAGNOSIS — N2581 Secondary hyperparathyroidism of renal origin: Secondary | ICD-10-CM | POA: Diagnosis not present

## 2021-04-13 DIAGNOSIS — K769 Liver disease, unspecified: Secondary | ICD-10-CM | POA: Diagnosis not present

## 2021-04-14 DIAGNOSIS — K769 Liver disease, unspecified: Secondary | ICD-10-CM | POA: Diagnosis not present

## 2021-04-14 DIAGNOSIS — D509 Iron deficiency anemia, unspecified: Secondary | ICD-10-CM | POA: Diagnosis not present

## 2021-04-14 DIAGNOSIS — Z4932 Encounter for adequacy testing for peritoneal dialysis: Secondary | ICD-10-CM | POA: Diagnosis not present

## 2021-04-14 DIAGNOSIS — D631 Anemia in chronic kidney disease: Secondary | ICD-10-CM | POA: Diagnosis not present

## 2021-04-14 DIAGNOSIS — N2581 Secondary hyperparathyroidism of renal origin: Secondary | ICD-10-CM | POA: Diagnosis not present

## 2021-04-14 DIAGNOSIS — R82998 Other abnormal findings in urine: Secondary | ICD-10-CM | POA: Diagnosis not present

## 2021-04-14 DIAGNOSIS — N186 End stage renal disease: Secondary | ICD-10-CM | POA: Diagnosis not present

## 2021-04-14 DIAGNOSIS — N2589 Other disorders resulting from impaired renal tubular function: Secondary | ICD-10-CM | POA: Diagnosis not present

## 2021-04-14 DIAGNOSIS — Z992 Dependence on renal dialysis: Secondary | ICD-10-CM | POA: Diagnosis not present

## 2021-04-15 DIAGNOSIS — Z992 Dependence on renal dialysis: Secondary | ICD-10-CM | POA: Diagnosis not present

## 2021-04-15 DIAGNOSIS — R82998 Other abnormal findings in urine: Secondary | ICD-10-CM | POA: Diagnosis not present

## 2021-04-15 DIAGNOSIS — K769 Liver disease, unspecified: Secondary | ICD-10-CM | POA: Diagnosis not present

## 2021-04-15 DIAGNOSIS — D631 Anemia in chronic kidney disease: Secondary | ICD-10-CM | POA: Diagnosis not present

## 2021-04-15 DIAGNOSIS — Z4932 Encounter for adequacy testing for peritoneal dialysis: Secondary | ICD-10-CM | POA: Diagnosis not present

## 2021-04-15 DIAGNOSIS — N186 End stage renal disease: Secondary | ICD-10-CM | POA: Diagnosis not present

## 2021-04-15 DIAGNOSIS — D509 Iron deficiency anemia, unspecified: Secondary | ICD-10-CM | POA: Diagnosis not present

## 2021-04-15 DIAGNOSIS — N2589 Other disorders resulting from impaired renal tubular function: Secondary | ICD-10-CM | POA: Diagnosis not present

## 2021-04-15 DIAGNOSIS — N2581 Secondary hyperparathyroidism of renal origin: Secondary | ICD-10-CM | POA: Diagnosis not present

## 2021-04-16 DIAGNOSIS — N2581 Secondary hyperparathyroidism of renal origin: Secondary | ICD-10-CM | POA: Diagnosis not present

## 2021-04-16 DIAGNOSIS — D509 Iron deficiency anemia, unspecified: Secondary | ICD-10-CM | POA: Diagnosis not present

## 2021-04-16 DIAGNOSIS — N2589 Other disorders resulting from impaired renal tubular function: Secondary | ICD-10-CM | POA: Diagnosis not present

## 2021-04-16 DIAGNOSIS — D631 Anemia in chronic kidney disease: Secondary | ICD-10-CM | POA: Diagnosis not present

## 2021-04-16 DIAGNOSIS — Z992 Dependence on renal dialysis: Secondary | ICD-10-CM | POA: Diagnosis not present

## 2021-04-16 DIAGNOSIS — Z4932 Encounter for adequacy testing for peritoneal dialysis: Secondary | ICD-10-CM | POA: Diagnosis not present

## 2021-04-16 DIAGNOSIS — N186 End stage renal disease: Secondary | ICD-10-CM | POA: Diagnosis not present

## 2021-04-16 DIAGNOSIS — R82998 Other abnormal findings in urine: Secondary | ICD-10-CM | POA: Diagnosis not present

## 2021-04-16 DIAGNOSIS — K769 Liver disease, unspecified: Secondary | ICD-10-CM | POA: Diagnosis not present

## 2021-04-17 DIAGNOSIS — D631 Anemia in chronic kidney disease: Secondary | ICD-10-CM | POA: Diagnosis not present

## 2021-04-17 DIAGNOSIS — Z4932 Encounter for adequacy testing for peritoneal dialysis: Secondary | ICD-10-CM | POA: Diagnosis not present

## 2021-04-17 DIAGNOSIS — R82998 Other abnormal findings in urine: Secondary | ICD-10-CM | POA: Diagnosis not present

## 2021-04-17 DIAGNOSIS — D509 Iron deficiency anemia, unspecified: Secondary | ICD-10-CM | POA: Diagnosis not present

## 2021-04-17 DIAGNOSIS — N2581 Secondary hyperparathyroidism of renal origin: Secondary | ICD-10-CM | POA: Diagnosis not present

## 2021-04-17 DIAGNOSIS — N186 End stage renal disease: Secondary | ICD-10-CM | POA: Diagnosis not present

## 2021-04-17 DIAGNOSIS — N2589 Other disorders resulting from impaired renal tubular function: Secondary | ICD-10-CM | POA: Diagnosis not present

## 2021-04-17 DIAGNOSIS — Z992 Dependence on renal dialysis: Secondary | ICD-10-CM | POA: Diagnosis not present

## 2021-04-17 DIAGNOSIS — K769 Liver disease, unspecified: Secondary | ICD-10-CM | POA: Diagnosis not present

## 2021-04-18 DIAGNOSIS — R82998 Other abnormal findings in urine: Secondary | ICD-10-CM | POA: Diagnosis not present

## 2021-04-18 DIAGNOSIS — N2581 Secondary hyperparathyroidism of renal origin: Secondary | ICD-10-CM | POA: Diagnosis not present

## 2021-04-18 DIAGNOSIS — Z4932 Encounter for adequacy testing for peritoneal dialysis: Secondary | ICD-10-CM | POA: Diagnosis not present

## 2021-04-18 DIAGNOSIS — Z992 Dependence on renal dialysis: Secondary | ICD-10-CM | POA: Diagnosis not present

## 2021-04-18 DIAGNOSIS — D631 Anemia in chronic kidney disease: Secondary | ICD-10-CM | POA: Diagnosis not present

## 2021-04-18 DIAGNOSIS — N186 End stage renal disease: Secondary | ICD-10-CM | POA: Diagnosis not present

## 2021-04-18 DIAGNOSIS — I129 Hypertensive chronic kidney disease with stage 1 through stage 4 chronic kidney disease, or unspecified chronic kidney disease: Secondary | ICD-10-CM | POA: Diagnosis not present

## 2021-04-18 DIAGNOSIS — N2589 Other disorders resulting from impaired renal tubular function: Secondary | ICD-10-CM | POA: Diagnosis not present

## 2021-04-18 DIAGNOSIS — K769 Liver disease, unspecified: Secondary | ICD-10-CM | POA: Diagnosis not present

## 2021-04-18 DIAGNOSIS — D509 Iron deficiency anemia, unspecified: Secondary | ICD-10-CM | POA: Diagnosis not present

## 2021-04-19 DIAGNOSIS — E440 Moderate protein-calorie malnutrition: Secondary | ICD-10-CM | POA: Diagnosis not present

## 2021-04-19 DIAGNOSIS — N186 End stage renal disease: Secondary | ICD-10-CM | POA: Diagnosis not present

## 2021-04-19 DIAGNOSIS — D631 Anemia in chronic kidney disease: Secondary | ICD-10-CM | POA: Diagnosis not present

## 2021-04-19 DIAGNOSIS — K769 Liver disease, unspecified: Secondary | ICD-10-CM | POA: Diagnosis not present

## 2021-04-19 DIAGNOSIS — D509 Iron deficiency anemia, unspecified: Secondary | ICD-10-CM | POA: Diagnosis not present

## 2021-04-19 DIAGNOSIS — Z992 Dependence on renal dialysis: Secondary | ICD-10-CM | POA: Diagnosis not present

## 2021-04-19 DIAGNOSIS — R82998 Other abnormal findings in urine: Secondary | ICD-10-CM | POA: Diagnosis not present

## 2021-04-19 DIAGNOSIS — N2581 Secondary hyperparathyroidism of renal origin: Secondary | ICD-10-CM | POA: Diagnosis not present

## 2021-04-19 DIAGNOSIS — Z79899 Other long term (current) drug therapy: Secondary | ICD-10-CM | POA: Diagnosis not present

## 2021-04-19 DIAGNOSIS — Z4932 Encounter for adequacy testing for peritoneal dialysis: Secondary | ICD-10-CM | POA: Diagnosis not present

## 2021-04-20 DIAGNOSIS — Z4932 Encounter for adequacy testing for peritoneal dialysis: Secondary | ICD-10-CM | POA: Diagnosis not present

## 2021-04-20 DIAGNOSIS — R82998 Other abnormal findings in urine: Secondary | ICD-10-CM | POA: Diagnosis not present

## 2021-04-20 DIAGNOSIS — D631 Anemia in chronic kidney disease: Secondary | ICD-10-CM | POA: Diagnosis not present

## 2021-04-20 DIAGNOSIS — D509 Iron deficiency anemia, unspecified: Secondary | ICD-10-CM | POA: Diagnosis not present

## 2021-04-20 DIAGNOSIS — E440 Moderate protein-calorie malnutrition: Secondary | ICD-10-CM | POA: Diagnosis not present

## 2021-04-20 DIAGNOSIS — N186 End stage renal disease: Secondary | ICD-10-CM | POA: Diagnosis not present

## 2021-04-20 DIAGNOSIS — Z992 Dependence on renal dialysis: Secondary | ICD-10-CM | POA: Diagnosis not present

## 2021-04-20 DIAGNOSIS — K769 Liver disease, unspecified: Secondary | ICD-10-CM | POA: Diagnosis not present

## 2021-04-20 DIAGNOSIS — N2581 Secondary hyperparathyroidism of renal origin: Secondary | ICD-10-CM | POA: Diagnosis not present

## 2021-04-20 DIAGNOSIS — Z79899 Other long term (current) drug therapy: Secondary | ICD-10-CM | POA: Diagnosis not present

## 2021-04-21 DIAGNOSIS — K769 Liver disease, unspecified: Secondary | ICD-10-CM | POA: Diagnosis not present

## 2021-04-21 DIAGNOSIS — R82998 Other abnormal findings in urine: Secondary | ICD-10-CM | POA: Diagnosis not present

## 2021-04-21 DIAGNOSIS — Z79899 Other long term (current) drug therapy: Secondary | ICD-10-CM | POA: Diagnosis not present

## 2021-04-21 DIAGNOSIS — N186 End stage renal disease: Secondary | ICD-10-CM | POA: Diagnosis not present

## 2021-04-21 DIAGNOSIS — N2581 Secondary hyperparathyroidism of renal origin: Secondary | ICD-10-CM | POA: Diagnosis not present

## 2021-04-21 DIAGNOSIS — Z992 Dependence on renal dialysis: Secondary | ICD-10-CM | POA: Diagnosis not present

## 2021-04-21 DIAGNOSIS — D509 Iron deficiency anemia, unspecified: Secondary | ICD-10-CM | POA: Diagnosis not present

## 2021-04-21 DIAGNOSIS — Z4932 Encounter for adequacy testing for peritoneal dialysis: Secondary | ICD-10-CM | POA: Diagnosis not present

## 2021-04-21 DIAGNOSIS — D631 Anemia in chronic kidney disease: Secondary | ICD-10-CM | POA: Diagnosis not present

## 2021-04-21 DIAGNOSIS — E440 Moderate protein-calorie malnutrition: Secondary | ICD-10-CM | POA: Diagnosis not present

## 2021-04-22 DIAGNOSIS — Z79899 Other long term (current) drug therapy: Secondary | ICD-10-CM | POA: Diagnosis not present

## 2021-04-22 DIAGNOSIS — D631 Anemia in chronic kidney disease: Secondary | ICD-10-CM | POA: Diagnosis not present

## 2021-04-22 DIAGNOSIS — R82998 Other abnormal findings in urine: Secondary | ICD-10-CM | POA: Diagnosis not present

## 2021-04-22 DIAGNOSIS — N186 End stage renal disease: Secondary | ICD-10-CM | POA: Diagnosis not present

## 2021-04-22 DIAGNOSIS — N2581 Secondary hyperparathyroidism of renal origin: Secondary | ICD-10-CM | POA: Diagnosis not present

## 2021-04-22 DIAGNOSIS — Z4932 Encounter for adequacy testing for peritoneal dialysis: Secondary | ICD-10-CM | POA: Diagnosis not present

## 2021-04-22 DIAGNOSIS — D509 Iron deficiency anemia, unspecified: Secondary | ICD-10-CM | POA: Diagnosis not present

## 2021-04-22 DIAGNOSIS — E440 Moderate protein-calorie malnutrition: Secondary | ICD-10-CM | POA: Diagnosis not present

## 2021-04-22 DIAGNOSIS — K769 Liver disease, unspecified: Secondary | ICD-10-CM | POA: Diagnosis not present

## 2021-04-22 DIAGNOSIS — Z992 Dependence on renal dialysis: Secondary | ICD-10-CM | POA: Diagnosis not present

## 2021-04-23 DIAGNOSIS — Z79899 Other long term (current) drug therapy: Secondary | ICD-10-CM | POA: Diagnosis not present

## 2021-04-23 DIAGNOSIS — Z4932 Encounter for adequacy testing for peritoneal dialysis: Secondary | ICD-10-CM | POA: Diagnosis not present

## 2021-04-23 DIAGNOSIS — D509 Iron deficiency anemia, unspecified: Secondary | ICD-10-CM | POA: Diagnosis not present

## 2021-04-23 DIAGNOSIS — K769 Liver disease, unspecified: Secondary | ICD-10-CM | POA: Diagnosis not present

## 2021-04-23 DIAGNOSIS — Z992 Dependence on renal dialysis: Secondary | ICD-10-CM | POA: Diagnosis not present

## 2021-04-23 DIAGNOSIS — N2581 Secondary hyperparathyroidism of renal origin: Secondary | ICD-10-CM | POA: Diagnosis not present

## 2021-04-23 DIAGNOSIS — N186 End stage renal disease: Secondary | ICD-10-CM | POA: Diagnosis not present

## 2021-04-23 DIAGNOSIS — R82998 Other abnormal findings in urine: Secondary | ICD-10-CM | POA: Diagnosis not present

## 2021-04-23 DIAGNOSIS — E440 Moderate protein-calorie malnutrition: Secondary | ICD-10-CM | POA: Diagnosis not present

## 2021-04-23 DIAGNOSIS — D631 Anemia in chronic kidney disease: Secondary | ICD-10-CM | POA: Diagnosis not present

## 2021-04-24 DIAGNOSIS — Z79899 Other long term (current) drug therapy: Secondary | ICD-10-CM | POA: Diagnosis not present

## 2021-04-24 DIAGNOSIS — D631 Anemia in chronic kidney disease: Secondary | ICD-10-CM | POA: Diagnosis not present

## 2021-04-24 DIAGNOSIS — E440 Moderate protein-calorie malnutrition: Secondary | ICD-10-CM | POA: Diagnosis not present

## 2021-04-24 DIAGNOSIS — N2581 Secondary hyperparathyroidism of renal origin: Secondary | ICD-10-CM | POA: Diagnosis not present

## 2021-04-24 DIAGNOSIS — R82998 Other abnormal findings in urine: Secondary | ICD-10-CM | POA: Diagnosis not present

## 2021-04-24 DIAGNOSIS — D509 Iron deficiency anemia, unspecified: Secondary | ICD-10-CM | POA: Diagnosis not present

## 2021-04-24 DIAGNOSIS — K769 Liver disease, unspecified: Secondary | ICD-10-CM | POA: Diagnosis not present

## 2021-04-24 DIAGNOSIS — Z4932 Encounter for adequacy testing for peritoneal dialysis: Secondary | ICD-10-CM | POA: Diagnosis not present

## 2021-04-24 DIAGNOSIS — Z992 Dependence on renal dialysis: Secondary | ICD-10-CM | POA: Diagnosis not present

## 2021-04-24 DIAGNOSIS — N186 End stage renal disease: Secondary | ICD-10-CM | POA: Diagnosis not present

## 2021-04-25 DIAGNOSIS — Z992 Dependence on renal dialysis: Secondary | ICD-10-CM | POA: Diagnosis not present

## 2021-04-25 DIAGNOSIS — K769 Liver disease, unspecified: Secondary | ICD-10-CM | POA: Diagnosis not present

## 2021-04-25 DIAGNOSIS — E440 Moderate protein-calorie malnutrition: Secondary | ICD-10-CM | POA: Diagnosis not present

## 2021-04-25 DIAGNOSIS — Z79899 Other long term (current) drug therapy: Secondary | ICD-10-CM | POA: Diagnosis not present

## 2021-04-25 DIAGNOSIS — D509 Iron deficiency anemia, unspecified: Secondary | ICD-10-CM | POA: Diagnosis not present

## 2021-04-25 DIAGNOSIS — D631 Anemia in chronic kidney disease: Secondary | ICD-10-CM | POA: Diagnosis not present

## 2021-04-25 DIAGNOSIS — N186 End stage renal disease: Secondary | ICD-10-CM | POA: Diagnosis not present

## 2021-04-25 DIAGNOSIS — Z4932 Encounter for adequacy testing for peritoneal dialysis: Secondary | ICD-10-CM | POA: Diagnosis not present

## 2021-04-25 DIAGNOSIS — R82998 Other abnormal findings in urine: Secondary | ICD-10-CM | POA: Diagnosis not present

## 2021-04-25 DIAGNOSIS — N2581 Secondary hyperparathyroidism of renal origin: Secondary | ICD-10-CM | POA: Diagnosis not present

## 2021-04-26 ENCOUNTER — Other Ambulatory Visit: Payer: Self-pay | Admitting: Primary Care

## 2021-04-26 DIAGNOSIS — R82998 Other abnormal findings in urine: Secondary | ICD-10-CM | POA: Diagnosis not present

## 2021-04-26 DIAGNOSIS — N2581 Secondary hyperparathyroidism of renal origin: Secondary | ICD-10-CM | POA: Diagnosis not present

## 2021-04-26 DIAGNOSIS — N186 End stage renal disease: Secondary | ICD-10-CM | POA: Diagnosis not present

## 2021-04-26 DIAGNOSIS — Z992 Dependence on renal dialysis: Secondary | ICD-10-CM | POA: Diagnosis not present

## 2021-04-26 DIAGNOSIS — D509 Iron deficiency anemia, unspecified: Secondary | ICD-10-CM | POA: Diagnosis not present

## 2021-04-26 DIAGNOSIS — Z4932 Encounter for adequacy testing for peritoneal dialysis: Secondary | ICD-10-CM | POA: Diagnosis not present

## 2021-04-26 DIAGNOSIS — E440 Moderate protein-calorie malnutrition: Secondary | ICD-10-CM | POA: Diagnosis not present

## 2021-04-26 DIAGNOSIS — K769 Liver disease, unspecified: Secondary | ICD-10-CM | POA: Diagnosis not present

## 2021-04-26 DIAGNOSIS — D631 Anemia in chronic kidney disease: Secondary | ICD-10-CM | POA: Diagnosis not present

## 2021-04-26 DIAGNOSIS — Z79899 Other long term (current) drug therapy: Secondary | ICD-10-CM | POA: Diagnosis not present

## 2021-04-27 DIAGNOSIS — D509 Iron deficiency anemia, unspecified: Secondary | ICD-10-CM | POA: Diagnosis not present

## 2021-04-27 DIAGNOSIS — D631 Anemia in chronic kidney disease: Secondary | ICD-10-CM | POA: Diagnosis not present

## 2021-04-27 DIAGNOSIS — Z992 Dependence on renal dialysis: Secondary | ICD-10-CM | POA: Diagnosis not present

## 2021-04-27 DIAGNOSIS — R82998 Other abnormal findings in urine: Secondary | ICD-10-CM | POA: Diagnosis not present

## 2021-04-27 DIAGNOSIS — N2581 Secondary hyperparathyroidism of renal origin: Secondary | ICD-10-CM | POA: Diagnosis not present

## 2021-04-27 DIAGNOSIS — E440 Moderate protein-calorie malnutrition: Secondary | ICD-10-CM | POA: Diagnosis not present

## 2021-04-27 DIAGNOSIS — K769 Liver disease, unspecified: Secondary | ICD-10-CM | POA: Diagnosis not present

## 2021-04-27 DIAGNOSIS — Z79899 Other long term (current) drug therapy: Secondary | ICD-10-CM | POA: Diagnosis not present

## 2021-04-27 DIAGNOSIS — Z4932 Encounter for adequacy testing for peritoneal dialysis: Secondary | ICD-10-CM | POA: Diagnosis not present

## 2021-04-27 DIAGNOSIS — N186 End stage renal disease: Secondary | ICD-10-CM | POA: Diagnosis not present

## 2021-04-28 DIAGNOSIS — Z4932 Encounter for adequacy testing for peritoneal dialysis: Secondary | ICD-10-CM | POA: Diagnosis not present

## 2021-04-28 DIAGNOSIS — K769 Liver disease, unspecified: Secondary | ICD-10-CM | POA: Diagnosis not present

## 2021-04-28 DIAGNOSIS — N186 End stage renal disease: Secondary | ICD-10-CM | POA: Diagnosis not present

## 2021-04-28 DIAGNOSIS — Z992 Dependence on renal dialysis: Secondary | ICD-10-CM | POA: Diagnosis not present

## 2021-04-28 DIAGNOSIS — N2581 Secondary hyperparathyroidism of renal origin: Secondary | ICD-10-CM | POA: Diagnosis not present

## 2021-04-28 DIAGNOSIS — R82998 Other abnormal findings in urine: Secondary | ICD-10-CM | POA: Diagnosis not present

## 2021-04-28 DIAGNOSIS — D509 Iron deficiency anemia, unspecified: Secondary | ICD-10-CM | POA: Diagnosis not present

## 2021-04-28 DIAGNOSIS — Z79899 Other long term (current) drug therapy: Secondary | ICD-10-CM | POA: Diagnosis not present

## 2021-04-28 DIAGNOSIS — D631 Anemia in chronic kidney disease: Secondary | ICD-10-CM | POA: Diagnosis not present

## 2021-04-28 DIAGNOSIS — E440 Moderate protein-calorie malnutrition: Secondary | ICD-10-CM | POA: Diagnosis not present

## 2021-04-29 DIAGNOSIS — N186 End stage renal disease: Secondary | ICD-10-CM | POA: Diagnosis not present

## 2021-04-29 DIAGNOSIS — N2581 Secondary hyperparathyroidism of renal origin: Secondary | ICD-10-CM | POA: Diagnosis not present

## 2021-04-29 DIAGNOSIS — E440 Moderate protein-calorie malnutrition: Secondary | ICD-10-CM | POA: Diagnosis not present

## 2021-04-29 DIAGNOSIS — D509 Iron deficiency anemia, unspecified: Secondary | ICD-10-CM | POA: Diagnosis not present

## 2021-04-29 DIAGNOSIS — Z992 Dependence on renal dialysis: Secondary | ICD-10-CM | POA: Diagnosis not present

## 2021-04-29 DIAGNOSIS — R82998 Other abnormal findings in urine: Secondary | ICD-10-CM | POA: Diagnosis not present

## 2021-04-29 DIAGNOSIS — Z79899 Other long term (current) drug therapy: Secondary | ICD-10-CM | POA: Diagnosis not present

## 2021-04-29 DIAGNOSIS — Z4932 Encounter for adequacy testing for peritoneal dialysis: Secondary | ICD-10-CM | POA: Diagnosis not present

## 2021-04-29 DIAGNOSIS — D631 Anemia in chronic kidney disease: Secondary | ICD-10-CM | POA: Diagnosis not present

## 2021-04-29 DIAGNOSIS — K769 Liver disease, unspecified: Secondary | ICD-10-CM | POA: Diagnosis not present

## 2021-04-30 DIAGNOSIS — D509 Iron deficiency anemia, unspecified: Secondary | ICD-10-CM | POA: Diagnosis not present

## 2021-04-30 DIAGNOSIS — N2581 Secondary hyperparathyroidism of renal origin: Secondary | ICD-10-CM | POA: Diagnosis not present

## 2021-04-30 DIAGNOSIS — Z992 Dependence on renal dialysis: Secondary | ICD-10-CM | POA: Diagnosis not present

## 2021-04-30 DIAGNOSIS — R82998 Other abnormal findings in urine: Secondary | ICD-10-CM | POA: Diagnosis not present

## 2021-04-30 DIAGNOSIS — D631 Anemia in chronic kidney disease: Secondary | ICD-10-CM | POA: Diagnosis not present

## 2021-04-30 DIAGNOSIS — Z4932 Encounter for adequacy testing for peritoneal dialysis: Secondary | ICD-10-CM | POA: Diagnosis not present

## 2021-04-30 DIAGNOSIS — E440 Moderate protein-calorie malnutrition: Secondary | ICD-10-CM | POA: Diagnosis not present

## 2021-04-30 DIAGNOSIS — N186 End stage renal disease: Secondary | ICD-10-CM | POA: Diagnosis not present

## 2021-04-30 DIAGNOSIS — Z79899 Other long term (current) drug therapy: Secondary | ICD-10-CM | POA: Diagnosis not present

## 2021-04-30 DIAGNOSIS — K769 Liver disease, unspecified: Secondary | ICD-10-CM | POA: Diagnosis not present

## 2021-05-01 DIAGNOSIS — E440 Moderate protein-calorie malnutrition: Secondary | ICD-10-CM | POA: Diagnosis not present

## 2021-05-01 DIAGNOSIS — Z4932 Encounter for adequacy testing for peritoneal dialysis: Secondary | ICD-10-CM | POA: Diagnosis not present

## 2021-05-01 DIAGNOSIS — D509 Iron deficiency anemia, unspecified: Secondary | ICD-10-CM | POA: Diagnosis not present

## 2021-05-01 DIAGNOSIS — Z992 Dependence on renal dialysis: Secondary | ICD-10-CM | POA: Diagnosis not present

## 2021-05-01 DIAGNOSIS — R82998 Other abnormal findings in urine: Secondary | ICD-10-CM | POA: Diagnosis not present

## 2021-05-01 DIAGNOSIS — K769 Liver disease, unspecified: Secondary | ICD-10-CM | POA: Diagnosis not present

## 2021-05-01 DIAGNOSIS — N186 End stage renal disease: Secondary | ICD-10-CM | POA: Diagnosis not present

## 2021-05-01 DIAGNOSIS — Z79899 Other long term (current) drug therapy: Secondary | ICD-10-CM | POA: Diagnosis not present

## 2021-05-01 DIAGNOSIS — N2581 Secondary hyperparathyroidism of renal origin: Secondary | ICD-10-CM | POA: Diagnosis not present

## 2021-05-01 DIAGNOSIS — D631 Anemia in chronic kidney disease: Secondary | ICD-10-CM | POA: Diagnosis not present

## 2021-05-02 DIAGNOSIS — K769 Liver disease, unspecified: Secondary | ICD-10-CM | POA: Diagnosis not present

## 2021-05-02 DIAGNOSIS — R82998 Other abnormal findings in urine: Secondary | ICD-10-CM | POA: Diagnosis not present

## 2021-05-02 DIAGNOSIS — E440 Moderate protein-calorie malnutrition: Secondary | ICD-10-CM | POA: Diagnosis not present

## 2021-05-02 DIAGNOSIS — N2581 Secondary hyperparathyroidism of renal origin: Secondary | ICD-10-CM | POA: Diagnosis not present

## 2021-05-02 DIAGNOSIS — Z4932 Encounter for adequacy testing for peritoneal dialysis: Secondary | ICD-10-CM | POA: Diagnosis not present

## 2021-05-02 DIAGNOSIS — N186 End stage renal disease: Secondary | ICD-10-CM | POA: Diagnosis not present

## 2021-05-02 DIAGNOSIS — D509 Iron deficiency anemia, unspecified: Secondary | ICD-10-CM | POA: Diagnosis not present

## 2021-05-02 DIAGNOSIS — Z79899 Other long term (current) drug therapy: Secondary | ICD-10-CM | POA: Diagnosis not present

## 2021-05-02 DIAGNOSIS — Z992 Dependence on renal dialysis: Secondary | ICD-10-CM | POA: Diagnosis not present

## 2021-05-02 DIAGNOSIS — D631 Anemia in chronic kidney disease: Secondary | ICD-10-CM | POA: Diagnosis not present

## 2021-05-03 DIAGNOSIS — Z992 Dependence on renal dialysis: Secondary | ICD-10-CM | POA: Diagnosis not present

## 2021-05-03 DIAGNOSIS — D631 Anemia in chronic kidney disease: Secondary | ICD-10-CM | POA: Diagnosis not present

## 2021-05-03 DIAGNOSIS — D509 Iron deficiency anemia, unspecified: Secondary | ICD-10-CM | POA: Diagnosis not present

## 2021-05-03 DIAGNOSIS — N2581 Secondary hyperparathyroidism of renal origin: Secondary | ICD-10-CM | POA: Diagnosis not present

## 2021-05-03 DIAGNOSIS — K769 Liver disease, unspecified: Secondary | ICD-10-CM | POA: Diagnosis not present

## 2021-05-03 DIAGNOSIS — E440 Moderate protein-calorie malnutrition: Secondary | ICD-10-CM | POA: Diagnosis not present

## 2021-05-03 DIAGNOSIS — Z79899 Other long term (current) drug therapy: Secondary | ICD-10-CM | POA: Diagnosis not present

## 2021-05-03 DIAGNOSIS — N186 End stage renal disease: Secondary | ICD-10-CM | POA: Diagnosis not present

## 2021-05-03 DIAGNOSIS — R82998 Other abnormal findings in urine: Secondary | ICD-10-CM | POA: Diagnosis not present

## 2021-05-03 DIAGNOSIS — Z4932 Encounter for adequacy testing for peritoneal dialysis: Secondary | ICD-10-CM | POA: Diagnosis not present

## 2021-05-04 DIAGNOSIS — N2581 Secondary hyperparathyroidism of renal origin: Secondary | ICD-10-CM | POA: Diagnosis not present

## 2021-05-04 DIAGNOSIS — K769 Liver disease, unspecified: Secondary | ICD-10-CM | POA: Diagnosis not present

## 2021-05-04 DIAGNOSIS — Z79899 Other long term (current) drug therapy: Secondary | ICD-10-CM | POA: Diagnosis not present

## 2021-05-04 DIAGNOSIS — Z992 Dependence on renal dialysis: Secondary | ICD-10-CM | POA: Diagnosis not present

## 2021-05-04 DIAGNOSIS — R82998 Other abnormal findings in urine: Secondary | ICD-10-CM | POA: Diagnosis not present

## 2021-05-04 DIAGNOSIS — D631 Anemia in chronic kidney disease: Secondary | ICD-10-CM | POA: Diagnosis not present

## 2021-05-04 DIAGNOSIS — D509 Iron deficiency anemia, unspecified: Secondary | ICD-10-CM | POA: Diagnosis not present

## 2021-05-04 DIAGNOSIS — E440 Moderate protein-calorie malnutrition: Secondary | ICD-10-CM | POA: Diagnosis not present

## 2021-05-04 DIAGNOSIS — Z4932 Encounter for adequacy testing for peritoneal dialysis: Secondary | ICD-10-CM | POA: Diagnosis not present

## 2021-05-04 DIAGNOSIS — N186 End stage renal disease: Secondary | ICD-10-CM | POA: Diagnosis not present

## 2021-05-05 DIAGNOSIS — D509 Iron deficiency anemia, unspecified: Secondary | ICD-10-CM | POA: Diagnosis not present

## 2021-05-05 DIAGNOSIS — Z79899 Other long term (current) drug therapy: Secondary | ICD-10-CM | POA: Diagnosis not present

## 2021-05-05 DIAGNOSIS — N186 End stage renal disease: Secondary | ICD-10-CM | POA: Diagnosis not present

## 2021-05-05 DIAGNOSIS — D631 Anemia in chronic kidney disease: Secondary | ICD-10-CM | POA: Diagnosis not present

## 2021-05-05 DIAGNOSIS — N2581 Secondary hyperparathyroidism of renal origin: Secondary | ICD-10-CM | POA: Diagnosis not present

## 2021-05-05 DIAGNOSIS — R82998 Other abnormal findings in urine: Secondary | ICD-10-CM | POA: Diagnosis not present

## 2021-05-05 DIAGNOSIS — E440 Moderate protein-calorie malnutrition: Secondary | ICD-10-CM | POA: Diagnosis not present

## 2021-05-05 DIAGNOSIS — Z4932 Encounter for adequacy testing for peritoneal dialysis: Secondary | ICD-10-CM | POA: Diagnosis not present

## 2021-05-05 DIAGNOSIS — K769 Liver disease, unspecified: Secondary | ICD-10-CM | POA: Diagnosis not present

## 2021-05-05 DIAGNOSIS — Z992 Dependence on renal dialysis: Secondary | ICD-10-CM | POA: Diagnosis not present

## 2021-05-06 DIAGNOSIS — K769 Liver disease, unspecified: Secondary | ICD-10-CM | POA: Diagnosis not present

## 2021-05-06 DIAGNOSIS — N186 End stage renal disease: Secondary | ICD-10-CM | POA: Diagnosis not present

## 2021-05-06 DIAGNOSIS — D631 Anemia in chronic kidney disease: Secondary | ICD-10-CM | POA: Diagnosis not present

## 2021-05-06 DIAGNOSIS — E440 Moderate protein-calorie malnutrition: Secondary | ICD-10-CM | POA: Diagnosis not present

## 2021-05-06 DIAGNOSIS — R82998 Other abnormal findings in urine: Secondary | ICD-10-CM | POA: Diagnosis not present

## 2021-05-06 DIAGNOSIS — Z79899 Other long term (current) drug therapy: Secondary | ICD-10-CM | POA: Diagnosis not present

## 2021-05-06 DIAGNOSIS — Z992 Dependence on renal dialysis: Secondary | ICD-10-CM | POA: Diagnosis not present

## 2021-05-06 DIAGNOSIS — N2581 Secondary hyperparathyroidism of renal origin: Secondary | ICD-10-CM | POA: Diagnosis not present

## 2021-05-06 DIAGNOSIS — Z4932 Encounter for adequacy testing for peritoneal dialysis: Secondary | ICD-10-CM | POA: Diagnosis not present

## 2021-05-06 DIAGNOSIS — D509 Iron deficiency anemia, unspecified: Secondary | ICD-10-CM | POA: Diagnosis not present

## 2021-05-07 DIAGNOSIS — N186 End stage renal disease: Secondary | ICD-10-CM | POA: Diagnosis not present

## 2021-05-07 DIAGNOSIS — N2581 Secondary hyperparathyroidism of renal origin: Secondary | ICD-10-CM | POA: Diagnosis not present

## 2021-05-07 DIAGNOSIS — E440 Moderate protein-calorie malnutrition: Secondary | ICD-10-CM | POA: Diagnosis not present

## 2021-05-07 DIAGNOSIS — D509 Iron deficiency anemia, unspecified: Secondary | ICD-10-CM | POA: Diagnosis not present

## 2021-05-07 DIAGNOSIS — Z4932 Encounter for adequacy testing for peritoneal dialysis: Secondary | ICD-10-CM | POA: Diagnosis not present

## 2021-05-07 DIAGNOSIS — Z992 Dependence on renal dialysis: Secondary | ICD-10-CM | POA: Diagnosis not present

## 2021-05-07 DIAGNOSIS — Z79899 Other long term (current) drug therapy: Secondary | ICD-10-CM | POA: Diagnosis not present

## 2021-05-07 DIAGNOSIS — R82998 Other abnormal findings in urine: Secondary | ICD-10-CM | POA: Diagnosis not present

## 2021-05-07 DIAGNOSIS — K769 Liver disease, unspecified: Secondary | ICD-10-CM | POA: Diagnosis not present

## 2021-05-07 DIAGNOSIS — D631 Anemia in chronic kidney disease: Secondary | ICD-10-CM | POA: Diagnosis not present

## 2021-05-08 DIAGNOSIS — Z4932 Encounter for adequacy testing for peritoneal dialysis: Secondary | ICD-10-CM | POA: Diagnosis not present

## 2021-05-08 DIAGNOSIS — R82998 Other abnormal findings in urine: Secondary | ICD-10-CM | POA: Diagnosis not present

## 2021-05-08 DIAGNOSIS — D509 Iron deficiency anemia, unspecified: Secondary | ICD-10-CM | POA: Diagnosis not present

## 2021-05-08 DIAGNOSIS — N2581 Secondary hyperparathyroidism of renal origin: Secondary | ICD-10-CM | POA: Diagnosis not present

## 2021-05-08 DIAGNOSIS — N186 End stage renal disease: Secondary | ICD-10-CM | POA: Diagnosis not present

## 2021-05-08 DIAGNOSIS — E440 Moderate protein-calorie malnutrition: Secondary | ICD-10-CM | POA: Diagnosis not present

## 2021-05-08 DIAGNOSIS — Z79899 Other long term (current) drug therapy: Secondary | ICD-10-CM | POA: Diagnosis not present

## 2021-05-08 DIAGNOSIS — D631 Anemia in chronic kidney disease: Secondary | ICD-10-CM | POA: Diagnosis not present

## 2021-05-08 DIAGNOSIS — Z992 Dependence on renal dialysis: Secondary | ICD-10-CM | POA: Diagnosis not present

## 2021-05-08 DIAGNOSIS — K769 Liver disease, unspecified: Secondary | ICD-10-CM | POA: Diagnosis not present

## 2021-05-09 DIAGNOSIS — D631 Anemia in chronic kidney disease: Secondary | ICD-10-CM | POA: Diagnosis not present

## 2021-05-09 DIAGNOSIS — Z992 Dependence on renal dialysis: Secondary | ICD-10-CM | POA: Diagnosis not present

## 2021-05-09 DIAGNOSIS — N2581 Secondary hyperparathyroidism of renal origin: Secondary | ICD-10-CM | POA: Diagnosis not present

## 2021-05-09 DIAGNOSIS — R82998 Other abnormal findings in urine: Secondary | ICD-10-CM | POA: Diagnosis not present

## 2021-05-09 DIAGNOSIS — E440 Moderate protein-calorie malnutrition: Secondary | ICD-10-CM | POA: Diagnosis not present

## 2021-05-09 DIAGNOSIS — Z79899 Other long term (current) drug therapy: Secondary | ICD-10-CM | POA: Diagnosis not present

## 2021-05-09 DIAGNOSIS — N186 End stage renal disease: Secondary | ICD-10-CM | POA: Diagnosis not present

## 2021-05-09 DIAGNOSIS — K769 Liver disease, unspecified: Secondary | ICD-10-CM | POA: Diagnosis not present

## 2021-05-09 DIAGNOSIS — D509 Iron deficiency anemia, unspecified: Secondary | ICD-10-CM | POA: Diagnosis not present

## 2021-05-09 DIAGNOSIS — Z4932 Encounter for adequacy testing for peritoneal dialysis: Secondary | ICD-10-CM | POA: Diagnosis not present

## 2021-05-10 DIAGNOSIS — N186 End stage renal disease: Secondary | ICD-10-CM | POA: Diagnosis not present

## 2021-05-10 DIAGNOSIS — N2581 Secondary hyperparathyroidism of renal origin: Secondary | ICD-10-CM | POA: Diagnosis not present

## 2021-05-10 DIAGNOSIS — Z79899 Other long term (current) drug therapy: Secondary | ICD-10-CM | POA: Diagnosis not present

## 2021-05-10 DIAGNOSIS — D509 Iron deficiency anemia, unspecified: Secondary | ICD-10-CM | POA: Diagnosis not present

## 2021-05-10 DIAGNOSIS — Z992 Dependence on renal dialysis: Secondary | ICD-10-CM | POA: Diagnosis not present

## 2021-05-10 DIAGNOSIS — Z4932 Encounter for adequacy testing for peritoneal dialysis: Secondary | ICD-10-CM | POA: Diagnosis not present

## 2021-05-10 DIAGNOSIS — R82998 Other abnormal findings in urine: Secondary | ICD-10-CM | POA: Diagnosis not present

## 2021-05-10 DIAGNOSIS — E440 Moderate protein-calorie malnutrition: Secondary | ICD-10-CM | POA: Diagnosis not present

## 2021-05-10 DIAGNOSIS — K769 Liver disease, unspecified: Secondary | ICD-10-CM | POA: Diagnosis not present

## 2021-05-10 DIAGNOSIS — D631 Anemia in chronic kidney disease: Secondary | ICD-10-CM | POA: Diagnosis not present

## 2021-05-11 DIAGNOSIS — K769 Liver disease, unspecified: Secondary | ICD-10-CM | POA: Diagnosis not present

## 2021-05-11 DIAGNOSIS — Z992 Dependence on renal dialysis: Secondary | ICD-10-CM | POA: Diagnosis not present

## 2021-05-11 DIAGNOSIS — N186 End stage renal disease: Secondary | ICD-10-CM | POA: Diagnosis not present

## 2021-05-11 DIAGNOSIS — Z79899 Other long term (current) drug therapy: Secondary | ICD-10-CM | POA: Diagnosis not present

## 2021-05-11 DIAGNOSIS — Z4932 Encounter for adequacy testing for peritoneal dialysis: Secondary | ICD-10-CM | POA: Diagnosis not present

## 2021-05-11 DIAGNOSIS — D509 Iron deficiency anemia, unspecified: Secondary | ICD-10-CM | POA: Diagnosis not present

## 2021-05-11 DIAGNOSIS — N2581 Secondary hyperparathyroidism of renal origin: Secondary | ICD-10-CM | POA: Diagnosis not present

## 2021-05-11 DIAGNOSIS — R82998 Other abnormal findings in urine: Secondary | ICD-10-CM | POA: Diagnosis not present

## 2021-05-11 DIAGNOSIS — E440 Moderate protein-calorie malnutrition: Secondary | ICD-10-CM | POA: Diagnosis not present

## 2021-05-11 DIAGNOSIS — D631 Anemia in chronic kidney disease: Secondary | ICD-10-CM | POA: Diagnosis not present

## 2021-05-12 DIAGNOSIS — E440 Moderate protein-calorie malnutrition: Secondary | ICD-10-CM | POA: Diagnosis not present

## 2021-05-12 DIAGNOSIS — Z4932 Encounter for adequacy testing for peritoneal dialysis: Secondary | ICD-10-CM | POA: Diagnosis not present

## 2021-05-12 DIAGNOSIS — D631 Anemia in chronic kidney disease: Secondary | ICD-10-CM | POA: Diagnosis not present

## 2021-05-12 DIAGNOSIS — D509 Iron deficiency anemia, unspecified: Secondary | ICD-10-CM | POA: Diagnosis not present

## 2021-05-12 DIAGNOSIS — N2581 Secondary hyperparathyroidism of renal origin: Secondary | ICD-10-CM | POA: Diagnosis not present

## 2021-05-12 DIAGNOSIS — Z79899 Other long term (current) drug therapy: Secondary | ICD-10-CM | POA: Diagnosis not present

## 2021-05-12 DIAGNOSIS — R82998 Other abnormal findings in urine: Secondary | ICD-10-CM | POA: Diagnosis not present

## 2021-05-12 DIAGNOSIS — N186 End stage renal disease: Secondary | ICD-10-CM | POA: Diagnosis not present

## 2021-05-12 DIAGNOSIS — K769 Liver disease, unspecified: Secondary | ICD-10-CM | POA: Diagnosis not present

## 2021-05-12 DIAGNOSIS — Z992 Dependence on renal dialysis: Secondary | ICD-10-CM | POA: Diagnosis not present

## 2021-05-13 DIAGNOSIS — Z4932 Encounter for adequacy testing for peritoneal dialysis: Secondary | ICD-10-CM | POA: Diagnosis not present

## 2021-05-13 DIAGNOSIS — Z79899 Other long term (current) drug therapy: Secondary | ICD-10-CM | POA: Diagnosis not present

## 2021-05-13 DIAGNOSIS — D631 Anemia in chronic kidney disease: Secondary | ICD-10-CM | POA: Diagnosis not present

## 2021-05-13 DIAGNOSIS — Z992 Dependence on renal dialysis: Secondary | ICD-10-CM | POA: Diagnosis not present

## 2021-05-13 DIAGNOSIS — N186 End stage renal disease: Secondary | ICD-10-CM | POA: Diagnosis not present

## 2021-05-13 DIAGNOSIS — R82998 Other abnormal findings in urine: Secondary | ICD-10-CM | POA: Diagnosis not present

## 2021-05-13 DIAGNOSIS — D509 Iron deficiency anemia, unspecified: Secondary | ICD-10-CM | POA: Diagnosis not present

## 2021-05-13 DIAGNOSIS — N2581 Secondary hyperparathyroidism of renal origin: Secondary | ICD-10-CM | POA: Diagnosis not present

## 2021-05-13 DIAGNOSIS — K769 Liver disease, unspecified: Secondary | ICD-10-CM | POA: Diagnosis not present

## 2021-05-13 DIAGNOSIS — E440 Moderate protein-calorie malnutrition: Secondary | ICD-10-CM | POA: Diagnosis not present

## 2021-05-14 DIAGNOSIS — D509 Iron deficiency anemia, unspecified: Secondary | ICD-10-CM | POA: Diagnosis not present

## 2021-05-14 DIAGNOSIS — Z79899 Other long term (current) drug therapy: Secondary | ICD-10-CM | POA: Diagnosis not present

## 2021-05-14 DIAGNOSIS — E440 Moderate protein-calorie malnutrition: Secondary | ICD-10-CM | POA: Diagnosis not present

## 2021-05-14 DIAGNOSIS — K769 Liver disease, unspecified: Secondary | ICD-10-CM | POA: Diagnosis not present

## 2021-05-14 DIAGNOSIS — Z992 Dependence on renal dialysis: Secondary | ICD-10-CM | POA: Diagnosis not present

## 2021-05-14 DIAGNOSIS — N2581 Secondary hyperparathyroidism of renal origin: Secondary | ICD-10-CM | POA: Diagnosis not present

## 2021-05-14 DIAGNOSIS — R82998 Other abnormal findings in urine: Secondary | ICD-10-CM | POA: Diagnosis not present

## 2021-05-14 DIAGNOSIS — D631 Anemia in chronic kidney disease: Secondary | ICD-10-CM | POA: Diagnosis not present

## 2021-05-14 DIAGNOSIS — N186 End stage renal disease: Secondary | ICD-10-CM | POA: Diagnosis not present

## 2021-05-14 DIAGNOSIS — Z4932 Encounter for adequacy testing for peritoneal dialysis: Secondary | ICD-10-CM | POA: Diagnosis not present

## 2021-05-15 DIAGNOSIS — E440 Moderate protein-calorie malnutrition: Secondary | ICD-10-CM | POA: Diagnosis not present

## 2021-05-15 DIAGNOSIS — N186 End stage renal disease: Secondary | ICD-10-CM | POA: Diagnosis not present

## 2021-05-15 DIAGNOSIS — R82998 Other abnormal findings in urine: Secondary | ICD-10-CM | POA: Diagnosis not present

## 2021-05-15 DIAGNOSIS — N2581 Secondary hyperparathyroidism of renal origin: Secondary | ICD-10-CM | POA: Diagnosis not present

## 2021-05-15 DIAGNOSIS — K769 Liver disease, unspecified: Secondary | ICD-10-CM | POA: Diagnosis not present

## 2021-05-15 DIAGNOSIS — Z4932 Encounter for adequacy testing for peritoneal dialysis: Secondary | ICD-10-CM | POA: Diagnosis not present

## 2021-05-15 DIAGNOSIS — D509 Iron deficiency anemia, unspecified: Secondary | ICD-10-CM | POA: Diagnosis not present

## 2021-05-15 DIAGNOSIS — Z79899 Other long term (current) drug therapy: Secondary | ICD-10-CM | POA: Diagnosis not present

## 2021-05-15 DIAGNOSIS — Z992 Dependence on renal dialysis: Secondary | ICD-10-CM | POA: Diagnosis not present

## 2021-05-15 DIAGNOSIS — D631 Anemia in chronic kidney disease: Secondary | ICD-10-CM | POA: Diagnosis not present

## 2021-05-16 DIAGNOSIS — K769 Liver disease, unspecified: Secondary | ICD-10-CM | POA: Diagnosis not present

## 2021-05-16 DIAGNOSIS — N2581 Secondary hyperparathyroidism of renal origin: Secondary | ICD-10-CM | POA: Diagnosis not present

## 2021-05-16 DIAGNOSIS — E440 Moderate protein-calorie malnutrition: Secondary | ICD-10-CM | POA: Diagnosis not present

## 2021-05-16 DIAGNOSIS — R82998 Other abnormal findings in urine: Secondary | ICD-10-CM | POA: Diagnosis not present

## 2021-05-16 DIAGNOSIS — D631 Anemia in chronic kidney disease: Secondary | ICD-10-CM | POA: Diagnosis not present

## 2021-05-16 DIAGNOSIS — Z4932 Encounter for adequacy testing for peritoneal dialysis: Secondary | ICD-10-CM | POA: Diagnosis not present

## 2021-05-16 DIAGNOSIS — Z992 Dependence on renal dialysis: Secondary | ICD-10-CM | POA: Diagnosis not present

## 2021-05-16 DIAGNOSIS — Z79899 Other long term (current) drug therapy: Secondary | ICD-10-CM | POA: Diagnosis not present

## 2021-05-16 DIAGNOSIS — N186 End stage renal disease: Secondary | ICD-10-CM | POA: Diagnosis not present

## 2021-05-16 DIAGNOSIS — D509 Iron deficiency anemia, unspecified: Secondary | ICD-10-CM | POA: Diagnosis not present

## 2021-05-17 DIAGNOSIS — D509 Iron deficiency anemia, unspecified: Secondary | ICD-10-CM | POA: Diagnosis not present

## 2021-05-17 DIAGNOSIS — Z4932 Encounter for adequacy testing for peritoneal dialysis: Secondary | ICD-10-CM | POA: Diagnosis not present

## 2021-05-17 DIAGNOSIS — Z79899 Other long term (current) drug therapy: Secondary | ICD-10-CM | POA: Diagnosis not present

## 2021-05-17 DIAGNOSIS — N186 End stage renal disease: Secondary | ICD-10-CM | POA: Diagnosis not present

## 2021-05-17 DIAGNOSIS — D631 Anemia in chronic kidney disease: Secondary | ICD-10-CM | POA: Diagnosis not present

## 2021-05-17 DIAGNOSIS — N2581 Secondary hyperparathyroidism of renal origin: Secondary | ICD-10-CM | POA: Diagnosis not present

## 2021-05-17 DIAGNOSIS — H2513 Age-related nuclear cataract, bilateral: Secondary | ICD-10-CM | POA: Diagnosis not present

## 2021-05-17 DIAGNOSIS — R82998 Other abnormal findings in urine: Secondary | ICD-10-CM | POA: Diagnosis not present

## 2021-05-17 DIAGNOSIS — K769 Liver disease, unspecified: Secondary | ICD-10-CM | POA: Diagnosis not present

## 2021-05-17 DIAGNOSIS — Z992 Dependence on renal dialysis: Secondary | ICD-10-CM | POA: Diagnosis not present

## 2021-05-17 DIAGNOSIS — E440 Moderate protein-calorie malnutrition: Secondary | ICD-10-CM | POA: Diagnosis not present

## 2021-05-18 DIAGNOSIS — N2581 Secondary hyperparathyroidism of renal origin: Secondary | ICD-10-CM | POA: Diagnosis not present

## 2021-05-18 DIAGNOSIS — E440 Moderate protein-calorie malnutrition: Secondary | ICD-10-CM | POA: Diagnosis not present

## 2021-05-18 DIAGNOSIS — Z992 Dependence on renal dialysis: Secondary | ICD-10-CM | POA: Diagnosis not present

## 2021-05-18 DIAGNOSIS — N186 End stage renal disease: Secondary | ICD-10-CM | POA: Diagnosis not present

## 2021-05-18 DIAGNOSIS — Z4932 Encounter for adequacy testing for peritoneal dialysis: Secondary | ICD-10-CM | POA: Diagnosis not present

## 2021-05-18 DIAGNOSIS — D631 Anemia in chronic kidney disease: Secondary | ICD-10-CM | POA: Diagnosis not present

## 2021-05-18 DIAGNOSIS — Z79899 Other long term (current) drug therapy: Secondary | ICD-10-CM | POA: Diagnosis not present

## 2021-05-18 DIAGNOSIS — D509 Iron deficiency anemia, unspecified: Secondary | ICD-10-CM | POA: Diagnosis not present

## 2021-05-18 DIAGNOSIS — K769 Liver disease, unspecified: Secondary | ICD-10-CM | POA: Diagnosis not present

## 2021-05-18 DIAGNOSIS — R82998 Other abnormal findings in urine: Secondary | ICD-10-CM | POA: Diagnosis not present

## 2021-05-19 DIAGNOSIS — D631 Anemia in chronic kidney disease: Secondary | ICD-10-CM | POA: Diagnosis not present

## 2021-05-19 DIAGNOSIS — Z992 Dependence on renal dialysis: Secondary | ICD-10-CM | POA: Diagnosis not present

## 2021-05-19 DIAGNOSIS — I129 Hypertensive chronic kidney disease with stage 1 through stage 4 chronic kidney disease, or unspecified chronic kidney disease: Secondary | ICD-10-CM | POA: Diagnosis not present

## 2021-05-19 DIAGNOSIS — Z4932 Encounter for adequacy testing for peritoneal dialysis: Secondary | ICD-10-CM | POA: Diagnosis not present

## 2021-05-19 DIAGNOSIS — E440 Moderate protein-calorie malnutrition: Secondary | ICD-10-CM | POA: Diagnosis not present

## 2021-05-19 DIAGNOSIS — N2581 Secondary hyperparathyroidism of renal origin: Secondary | ICD-10-CM | POA: Diagnosis not present

## 2021-05-19 DIAGNOSIS — K769 Liver disease, unspecified: Secondary | ICD-10-CM | POA: Diagnosis not present

## 2021-05-19 DIAGNOSIS — D509 Iron deficiency anemia, unspecified: Secondary | ICD-10-CM | POA: Diagnosis not present

## 2021-05-19 DIAGNOSIS — R82998 Other abnormal findings in urine: Secondary | ICD-10-CM | POA: Diagnosis not present

## 2021-05-19 DIAGNOSIS — N186 End stage renal disease: Secondary | ICD-10-CM | POA: Diagnosis not present

## 2021-05-19 DIAGNOSIS — Z79899 Other long term (current) drug therapy: Secondary | ICD-10-CM | POA: Diagnosis not present

## 2021-05-20 DIAGNOSIS — N2581 Secondary hyperparathyroidism of renal origin: Secondary | ICD-10-CM | POA: Diagnosis not present

## 2021-05-20 DIAGNOSIS — R82998 Other abnormal findings in urine: Secondary | ICD-10-CM | POA: Diagnosis not present

## 2021-05-20 DIAGNOSIS — Z4932 Encounter for adequacy testing for peritoneal dialysis: Secondary | ICD-10-CM | POA: Diagnosis not present

## 2021-05-20 DIAGNOSIS — N186 End stage renal disease: Secondary | ICD-10-CM | POA: Diagnosis not present

## 2021-05-20 DIAGNOSIS — Z79899 Other long term (current) drug therapy: Secondary | ICD-10-CM | POA: Diagnosis not present

## 2021-05-20 DIAGNOSIS — D631 Anemia in chronic kidney disease: Secondary | ICD-10-CM | POA: Diagnosis not present

## 2021-05-20 DIAGNOSIS — D509 Iron deficiency anemia, unspecified: Secondary | ICD-10-CM | POA: Diagnosis not present

## 2021-05-20 DIAGNOSIS — E440 Moderate protein-calorie malnutrition: Secondary | ICD-10-CM | POA: Diagnosis not present

## 2021-05-20 DIAGNOSIS — Z992 Dependence on renal dialysis: Secondary | ICD-10-CM | POA: Diagnosis not present

## 2021-05-20 DIAGNOSIS — K769 Liver disease, unspecified: Secondary | ICD-10-CM | POA: Diagnosis not present

## 2021-05-21 DIAGNOSIS — N2581 Secondary hyperparathyroidism of renal origin: Secondary | ICD-10-CM | POA: Diagnosis not present

## 2021-05-21 DIAGNOSIS — D631 Anemia in chronic kidney disease: Secondary | ICD-10-CM | POA: Diagnosis not present

## 2021-05-21 DIAGNOSIS — N186 End stage renal disease: Secondary | ICD-10-CM | POA: Diagnosis not present

## 2021-05-21 DIAGNOSIS — Z79899 Other long term (current) drug therapy: Secondary | ICD-10-CM | POA: Diagnosis not present

## 2021-05-21 DIAGNOSIS — D509 Iron deficiency anemia, unspecified: Secondary | ICD-10-CM | POA: Diagnosis not present

## 2021-05-21 DIAGNOSIS — E440 Moderate protein-calorie malnutrition: Secondary | ICD-10-CM | POA: Diagnosis not present

## 2021-05-21 DIAGNOSIS — Z992 Dependence on renal dialysis: Secondary | ICD-10-CM | POA: Diagnosis not present

## 2021-05-21 DIAGNOSIS — Z4932 Encounter for adequacy testing for peritoneal dialysis: Secondary | ICD-10-CM | POA: Diagnosis not present

## 2021-05-21 DIAGNOSIS — K769 Liver disease, unspecified: Secondary | ICD-10-CM | POA: Diagnosis not present

## 2021-05-21 DIAGNOSIS — R82998 Other abnormal findings in urine: Secondary | ICD-10-CM | POA: Diagnosis not present

## 2021-05-22 DIAGNOSIS — N186 End stage renal disease: Secondary | ICD-10-CM | POA: Diagnosis not present

## 2021-05-22 DIAGNOSIS — Z79899 Other long term (current) drug therapy: Secondary | ICD-10-CM | POA: Diagnosis not present

## 2021-05-22 DIAGNOSIS — Z4932 Encounter for adequacy testing for peritoneal dialysis: Secondary | ICD-10-CM | POA: Diagnosis not present

## 2021-05-22 DIAGNOSIS — D631 Anemia in chronic kidney disease: Secondary | ICD-10-CM | POA: Diagnosis not present

## 2021-05-22 DIAGNOSIS — R82998 Other abnormal findings in urine: Secondary | ICD-10-CM | POA: Diagnosis not present

## 2021-05-22 DIAGNOSIS — K769 Liver disease, unspecified: Secondary | ICD-10-CM | POA: Diagnosis not present

## 2021-05-22 DIAGNOSIS — N2581 Secondary hyperparathyroidism of renal origin: Secondary | ICD-10-CM | POA: Diagnosis not present

## 2021-05-22 DIAGNOSIS — E440 Moderate protein-calorie malnutrition: Secondary | ICD-10-CM | POA: Diagnosis not present

## 2021-05-22 DIAGNOSIS — Z992 Dependence on renal dialysis: Secondary | ICD-10-CM | POA: Diagnosis not present

## 2021-05-22 DIAGNOSIS — D509 Iron deficiency anemia, unspecified: Secondary | ICD-10-CM | POA: Diagnosis not present

## 2021-05-23 DIAGNOSIS — R82998 Other abnormal findings in urine: Secondary | ICD-10-CM | POA: Diagnosis not present

## 2021-05-23 DIAGNOSIS — D631 Anemia in chronic kidney disease: Secondary | ICD-10-CM | POA: Diagnosis not present

## 2021-05-23 DIAGNOSIS — Z4932 Encounter for adequacy testing for peritoneal dialysis: Secondary | ICD-10-CM | POA: Diagnosis not present

## 2021-05-23 DIAGNOSIS — Z79899 Other long term (current) drug therapy: Secondary | ICD-10-CM | POA: Diagnosis not present

## 2021-05-23 DIAGNOSIS — E440 Moderate protein-calorie malnutrition: Secondary | ICD-10-CM | POA: Diagnosis not present

## 2021-05-23 DIAGNOSIS — N2581 Secondary hyperparathyroidism of renal origin: Secondary | ICD-10-CM | POA: Diagnosis not present

## 2021-05-23 DIAGNOSIS — Z992 Dependence on renal dialysis: Secondary | ICD-10-CM | POA: Diagnosis not present

## 2021-05-23 DIAGNOSIS — D509 Iron deficiency anemia, unspecified: Secondary | ICD-10-CM | POA: Diagnosis not present

## 2021-05-23 DIAGNOSIS — K769 Liver disease, unspecified: Secondary | ICD-10-CM | POA: Diagnosis not present

## 2021-05-23 DIAGNOSIS — N186 End stage renal disease: Secondary | ICD-10-CM | POA: Diagnosis not present

## 2021-05-24 DIAGNOSIS — Z79899 Other long term (current) drug therapy: Secondary | ICD-10-CM | POA: Diagnosis not present

## 2021-05-24 DIAGNOSIS — N2581 Secondary hyperparathyroidism of renal origin: Secondary | ICD-10-CM | POA: Diagnosis not present

## 2021-05-24 DIAGNOSIS — Z4932 Encounter for adequacy testing for peritoneal dialysis: Secondary | ICD-10-CM | POA: Diagnosis not present

## 2021-05-24 DIAGNOSIS — R82998 Other abnormal findings in urine: Secondary | ICD-10-CM | POA: Diagnosis not present

## 2021-05-24 DIAGNOSIS — K769 Liver disease, unspecified: Secondary | ICD-10-CM | POA: Diagnosis not present

## 2021-05-24 DIAGNOSIS — N186 End stage renal disease: Secondary | ICD-10-CM | POA: Diagnosis not present

## 2021-05-24 DIAGNOSIS — Z992 Dependence on renal dialysis: Secondary | ICD-10-CM | POA: Diagnosis not present

## 2021-05-24 DIAGNOSIS — E440 Moderate protein-calorie malnutrition: Secondary | ICD-10-CM | POA: Diagnosis not present

## 2021-05-24 DIAGNOSIS — D509 Iron deficiency anemia, unspecified: Secondary | ICD-10-CM | POA: Diagnosis not present

## 2021-05-24 DIAGNOSIS — D631 Anemia in chronic kidney disease: Secondary | ICD-10-CM | POA: Diagnosis not present

## 2021-05-25 DIAGNOSIS — D631 Anemia in chronic kidney disease: Secondary | ICD-10-CM | POA: Diagnosis not present

## 2021-05-25 DIAGNOSIS — K769 Liver disease, unspecified: Secondary | ICD-10-CM | POA: Diagnosis not present

## 2021-05-25 DIAGNOSIS — D509 Iron deficiency anemia, unspecified: Secondary | ICD-10-CM | POA: Diagnosis not present

## 2021-05-25 DIAGNOSIS — Z992 Dependence on renal dialysis: Secondary | ICD-10-CM | POA: Diagnosis not present

## 2021-05-25 DIAGNOSIS — E440 Moderate protein-calorie malnutrition: Secondary | ICD-10-CM | POA: Diagnosis not present

## 2021-05-25 DIAGNOSIS — Z79899 Other long term (current) drug therapy: Secondary | ICD-10-CM | POA: Diagnosis not present

## 2021-05-25 DIAGNOSIS — N2581 Secondary hyperparathyroidism of renal origin: Secondary | ICD-10-CM | POA: Diagnosis not present

## 2021-05-25 DIAGNOSIS — R82998 Other abnormal findings in urine: Secondary | ICD-10-CM | POA: Diagnosis not present

## 2021-05-25 DIAGNOSIS — N186 End stage renal disease: Secondary | ICD-10-CM | POA: Diagnosis not present

## 2021-05-25 DIAGNOSIS — Z4932 Encounter for adequacy testing for peritoneal dialysis: Secondary | ICD-10-CM | POA: Diagnosis not present

## 2021-05-26 DIAGNOSIS — E440 Moderate protein-calorie malnutrition: Secondary | ICD-10-CM | POA: Diagnosis not present

## 2021-05-26 DIAGNOSIS — D509 Iron deficiency anemia, unspecified: Secondary | ICD-10-CM | POA: Diagnosis not present

## 2021-05-26 DIAGNOSIS — N2581 Secondary hyperparathyroidism of renal origin: Secondary | ICD-10-CM | POA: Diagnosis not present

## 2021-05-26 DIAGNOSIS — Z4932 Encounter for adequacy testing for peritoneal dialysis: Secondary | ICD-10-CM | POA: Diagnosis not present

## 2021-05-26 DIAGNOSIS — N186 End stage renal disease: Secondary | ICD-10-CM | POA: Diagnosis not present

## 2021-05-26 DIAGNOSIS — Z79899 Other long term (current) drug therapy: Secondary | ICD-10-CM | POA: Diagnosis not present

## 2021-05-26 DIAGNOSIS — Z992 Dependence on renal dialysis: Secondary | ICD-10-CM | POA: Diagnosis not present

## 2021-05-26 DIAGNOSIS — D631 Anemia in chronic kidney disease: Secondary | ICD-10-CM | POA: Diagnosis not present

## 2021-05-26 DIAGNOSIS — K769 Liver disease, unspecified: Secondary | ICD-10-CM | POA: Diagnosis not present

## 2021-05-26 DIAGNOSIS — R82998 Other abnormal findings in urine: Secondary | ICD-10-CM | POA: Diagnosis not present

## 2021-05-27 DIAGNOSIS — Z79899 Other long term (current) drug therapy: Secondary | ICD-10-CM | POA: Diagnosis not present

## 2021-05-27 DIAGNOSIS — Z4932 Encounter for adequacy testing for peritoneal dialysis: Secondary | ICD-10-CM | POA: Diagnosis not present

## 2021-05-27 DIAGNOSIS — E440 Moderate protein-calorie malnutrition: Secondary | ICD-10-CM | POA: Diagnosis not present

## 2021-05-27 DIAGNOSIS — K769 Liver disease, unspecified: Secondary | ICD-10-CM | POA: Diagnosis not present

## 2021-05-27 DIAGNOSIS — D509 Iron deficiency anemia, unspecified: Secondary | ICD-10-CM | POA: Diagnosis not present

## 2021-05-27 DIAGNOSIS — Z992 Dependence on renal dialysis: Secondary | ICD-10-CM | POA: Diagnosis not present

## 2021-05-27 DIAGNOSIS — R82998 Other abnormal findings in urine: Secondary | ICD-10-CM | POA: Diagnosis not present

## 2021-05-27 DIAGNOSIS — N2581 Secondary hyperparathyroidism of renal origin: Secondary | ICD-10-CM | POA: Diagnosis not present

## 2021-05-27 DIAGNOSIS — N186 End stage renal disease: Secondary | ICD-10-CM | POA: Diagnosis not present

## 2021-05-27 DIAGNOSIS — D631 Anemia in chronic kidney disease: Secondary | ICD-10-CM | POA: Diagnosis not present

## 2021-05-28 DIAGNOSIS — N186 End stage renal disease: Secondary | ICD-10-CM | POA: Diagnosis not present

## 2021-05-28 DIAGNOSIS — D509 Iron deficiency anemia, unspecified: Secondary | ICD-10-CM | POA: Diagnosis not present

## 2021-05-28 DIAGNOSIS — N2581 Secondary hyperparathyroidism of renal origin: Secondary | ICD-10-CM | POA: Diagnosis not present

## 2021-05-28 DIAGNOSIS — K769 Liver disease, unspecified: Secondary | ICD-10-CM | POA: Diagnosis not present

## 2021-05-28 DIAGNOSIS — E440 Moderate protein-calorie malnutrition: Secondary | ICD-10-CM | POA: Diagnosis not present

## 2021-05-28 DIAGNOSIS — Z79899 Other long term (current) drug therapy: Secondary | ICD-10-CM | POA: Diagnosis not present

## 2021-05-28 DIAGNOSIS — Z4932 Encounter for adequacy testing for peritoneal dialysis: Secondary | ICD-10-CM | POA: Diagnosis not present

## 2021-05-28 DIAGNOSIS — Z992 Dependence on renal dialysis: Secondary | ICD-10-CM | POA: Diagnosis not present

## 2021-05-28 DIAGNOSIS — R82998 Other abnormal findings in urine: Secondary | ICD-10-CM | POA: Diagnosis not present

## 2021-05-28 DIAGNOSIS — D631 Anemia in chronic kidney disease: Secondary | ICD-10-CM | POA: Diagnosis not present

## 2021-05-29 DIAGNOSIS — Z4932 Encounter for adequacy testing for peritoneal dialysis: Secondary | ICD-10-CM | POA: Diagnosis not present

## 2021-05-29 DIAGNOSIS — Z992 Dependence on renal dialysis: Secondary | ICD-10-CM | POA: Diagnosis not present

## 2021-05-29 DIAGNOSIS — Z79899 Other long term (current) drug therapy: Secondary | ICD-10-CM | POA: Diagnosis not present

## 2021-05-29 DIAGNOSIS — E440 Moderate protein-calorie malnutrition: Secondary | ICD-10-CM | POA: Diagnosis not present

## 2021-05-29 DIAGNOSIS — R82998 Other abnormal findings in urine: Secondary | ICD-10-CM | POA: Diagnosis not present

## 2021-05-29 DIAGNOSIS — N2581 Secondary hyperparathyroidism of renal origin: Secondary | ICD-10-CM | POA: Diagnosis not present

## 2021-05-29 DIAGNOSIS — N186 End stage renal disease: Secondary | ICD-10-CM | POA: Diagnosis not present

## 2021-05-29 DIAGNOSIS — K769 Liver disease, unspecified: Secondary | ICD-10-CM | POA: Diagnosis not present

## 2021-05-29 DIAGNOSIS — D631 Anemia in chronic kidney disease: Secondary | ICD-10-CM | POA: Diagnosis not present

## 2021-05-29 DIAGNOSIS — D509 Iron deficiency anemia, unspecified: Secondary | ICD-10-CM | POA: Diagnosis not present

## 2021-05-30 DIAGNOSIS — Z4932 Encounter for adequacy testing for peritoneal dialysis: Secondary | ICD-10-CM | POA: Diagnosis not present

## 2021-05-30 DIAGNOSIS — K769 Liver disease, unspecified: Secondary | ICD-10-CM | POA: Diagnosis not present

## 2021-05-30 DIAGNOSIS — Z79899 Other long term (current) drug therapy: Secondary | ICD-10-CM | POA: Diagnosis not present

## 2021-05-30 DIAGNOSIS — R82998 Other abnormal findings in urine: Secondary | ICD-10-CM | POA: Diagnosis not present

## 2021-05-30 DIAGNOSIS — E440 Moderate protein-calorie malnutrition: Secondary | ICD-10-CM | POA: Diagnosis not present

## 2021-05-30 DIAGNOSIS — N186 End stage renal disease: Secondary | ICD-10-CM | POA: Diagnosis not present

## 2021-05-30 DIAGNOSIS — Z992 Dependence on renal dialysis: Secondary | ICD-10-CM | POA: Diagnosis not present

## 2021-05-30 DIAGNOSIS — D631 Anemia in chronic kidney disease: Secondary | ICD-10-CM | POA: Diagnosis not present

## 2021-05-30 DIAGNOSIS — D509 Iron deficiency anemia, unspecified: Secondary | ICD-10-CM | POA: Diagnosis not present

## 2021-05-30 DIAGNOSIS — N2581 Secondary hyperparathyroidism of renal origin: Secondary | ICD-10-CM | POA: Diagnosis not present

## 2021-05-31 DIAGNOSIS — E440 Moderate protein-calorie malnutrition: Secondary | ICD-10-CM | POA: Diagnosis not present

## 2021-05-31 DIAGNOSIS — R82998 Other abnormal findings in urine: Secondary | ICD-10-CM | POA: Diagnosis not present

## 2021-05-31 DIAGNOSIS — N186 End stage renal disease: Secondary | ICD-10-CM | POA: Diagnosis not present

## 2021-05-31 DIAGNOSIS — D631 Anemia in chronic kidney disease: Secondary | ICD-10-CM | POA: Diagnosis not present

## 2021-05-31 DIAGNOSIS — Z4932 Encounter for adequacy testing for peritoneal dialysis: Secondary | ICD-10-CM | POA: Diagnosis not present

## 2021-05-31 DIAGNOSIS — K769 Liver disease, unspecified: Secondary | ICD-10-CM | POA: Diagnosis not present

## 2021-05-31 DIAGNOSIS — D509 Iron deficiency anemia, unspecified: Secondary | ICD-10-CM | POA: Diagnosis not present

## 2021-05-31 DIAGNOSIS — Z79899 Other long term (current) drug therapy: Secondary | ICD-10-CM | POA: Diagnosis not present

## 2021-05-31 DIAGNOSIS — N2581 Secondary hyperparathyroidism of renal origin: Secondary | ICD-10-CM | POA: Diagnosis not present

## 2021-05-31 DIAGNOSIS — Z992 Dependence on renal dialysis: Secondary | ICD-10-CM | POA: Diagnosis not present

## 2021-06-01 DIAGNOSIS — N2581 Secondary hyperparathyroidism of renal origin: Secondary | ICD-10-CM | POA: Diagnosis not present

## 2021-06-01 DIAGNOSIS — D631 Anemia in chronic kidney disease: Secondary | ICD-10-CM | POA: Diagnosis not present

## 2021-06-01 DIAGNOSIS — E440 Moderate protein-calorie malnutrition: Secondary | ICD-10-CM | POA: Diagnosis not present

## 2021-06-01 DIAGNOSIS — Z79899 Other long term (current) drug therapy: Secondary | ICD-10-CM | POA: Diagnosis not present

## 2021-06-01 DIAGNOSIS — Z4932 Encounter for adequacy testing for peritoneal dialysis: Secondary | ICD-10-CM | POA: Diagnosis not present

## 2021-06-01 DIAGNOSIS — N186 End stage renal disease: Secondary | ICD-10-CM | POA: Diagnosis not present

## 2021-06-01 DIAGNOSIS — Z992 Dependence on renal dialysis: Secondary | ICD-10-CM | POA: Diagnosis not present

## 2021-06-01 DIAGNOSIS — R82998 Other abnormal findings in urine: Secondary | ICD-10-CM | POA: Diagnosis not present

## 2021-06-01 DIAGNOSIS — D509 Iron deficiency anemia, unspecified: Secondary | ICD-10-CM | POA: Diagnosis not present

## 2021-06-01 DIAGNOSIS — K769 Liver disease, unspecified: Secondary | ICD-10-CM | POA: Diagnosis not present

## 2021-06-02 DIAGNOSIS — Z4932 Encounter for adequacy testing for peritoneal dialysis: Secondary | ICD-10-CM | POA: Diagnosis not present

## 2021-06-02 DIAGNOSIS — Z992 Dependence on renal dialysis: Secondary | ICD-10-CM | POA: Diagnosis not present

## 2021-06-02 DIAGNOSIS — D509 Iron deficiency anemia, unspecified: Secondary | ICD-10-CM | POA: Diagnosis not present

## 2021-06-02 DIAGNOSIS — N2581 Secondary hyperparathyroidism of renal origin: Secondary | ICD-10-CM | POA: Diagnosis not present

## 2021-06-02 DIAGNOSIS — K769 Liver disease, unspecified: Secondary | ICD-10-CM | POA: Diagnosis not present

## 2021-06-02 DIAGNOSIS — Z79899 Other long term (current) drug therapy: Secondary | ICD-10-CM | POA: Diagnosis not present

## 2021-06-02 DIAGNOSIS — N186 End stage renal disease: Secondary | ICD-10-CM | POA: Diagnosis not present

## 2021-06-02 DIAGNOSIS — D631 Anemia in chronic kidney disease: Secondary | ICD-10-CM | POA: Diagnosis not present

## 2021-06-02 DIAGNOSIS — E440 Moderate protein-calorie malnutrition: Secondary | ICD-10-CM | POA: Diagnosis not present

## 2021-06-02 DIAGNOSIS — R82998 Other abnormal findings in urine: Secondary | ICD-10-CM | POA: Diagnosis not present

## 2021-06-03 DIAGNOSIS — J449 Chronic obstructive pulmonary disease, unspecified: Secondary | ICD-10-CM | POA: Diagnosis not present

## 2021-06-03 DIAGNOSIS — Z79899 Other long term (current) drug therapy: Secondary | ICD-10-CM | POA: Diagnosis not present

## 2021-06-03 DIAGNOSIS — I429 Cardiomyopathy, unspecified: Secondary | ICD-10-CM | POA: Diagnosis not present

## 2021-06-03 DIAGNOSIS — R82998 Other abnormal findings in urine: Secondary | ICD-10-CM | POA: Diagnosis not present

## 2021-06-03 DIAGNOSIS — E440 Moderate protein-calorie malnutrition: Secondary | ICD-10-CM | POA: Diagnosis not present

## 2021-06-03 DIAGNOSIS — Z4932 Encounter for adequacy testing for peritoneal dialysis: Secondary | ICD-10-CM | POA: Diagnosis not present

## 2021-06-03 DIAGNOSIS — N186 End stage renal disease: Secondary | ICD-10-CM | POA: Diagnosis not present

## 2021-06-03 DIAGNOSIS — K769 Liver disease, unspecified: Secondary | ICD-10-CM | POA: Diagnosis not present

## 2021-06-03 DIAGNOSIS — I4891 Unspecified atrial fibrillation: Secondary | ICD-10-CM | POA: Diagnosis not present

## 2021-06-03 DIAGNOSIS — D631 Anemia in chronic kidney disease: Secondary | ICD-10-CM | POA: Diagnosis not present

## 2021-06-03 DIAGNOSIS — Z992 Dependence on renal dialysis: Secondary | ICD-10-CM | POA: Diagnosis not present

## 2021-06-03 DIAGNOSIS — D509 Iron deficiency anemia, unspecified: Secondary | ICD-10-CM | POA: Diagnosis not present

## 2021-06-03 DIAGNOSIS — N185 Chronic kidney disease, stage 5: Secondary | ICD-10-CM | POA: Diagnosis not present

## 2021-06-03 DIAGNOSIS — N2581 Secondary hyperparathyroidism of renal origin: Secondary | ICD-10-CM | POA: Diagnosis not present

## 2021-06-04 DIAGNOSIS — Z79899 Other long term (current) drug therapy: Secondary | ICD-10-CM | POA: Diagnosis not present

## 2021-06-04 DIAGNOSIS — D509 Iron deficiency anemia, unspecified: Secondary | ICD-10-CM | POA: Diagnosis not present

## 2021-06-04 DIAGNOSIS — K769 Liver disease, unspecified: Secondary | ICD-10-CM | POA: Diagnosis not present

## 2021-06-04 DIAGNOSIS — Z992 Dependence on renal dialysis: Secondary | ICD-10-CM | POA: Diagnosis not present

## 2021-06-04 DIAGNOSIS — E440 Moderate protein-calorie malnutrition: Secondary | ICD-10-CM | POA: Diagnosis not present

## 2021-06-04 DIAGNOSIS — N186 End stage renal disease: Secondary | ICD-10-CM | POA: Diagnosis not present

## 2021-06-04 DIAGNOSIS — N2581 Secondary hyperparathyroidism of renal origin: Secondary | ICD-10-CM | POA: Diagnosis not present

## 2021-06-04 DIAGNOSIS — Z4932 Encounter for adequacy testing for peritoneal dialysis: Secondary | ICD-10-CM | POA: Diagnosis not present

## 2021-06-04 DIAGNOSIS — D631 Anemia in chronic kidney disease: Secondary | ICD-10-CM | POA: Diagnosis not present

## 2021-06-04 DIAGNOSIS — R82998 Other abnormal findings in urine: Secondary | ICD-10-CM | POA: Diagnosis not present

## 2021-06-05 DIAGNOSIS — N186 End stage renal disease: Secondary | ICD-10-CM | POA: Diagnosis not present

## 2021-06-05 DIAGNOSIS — N2581 Secondary hyperparathyroidism of renal origin: Secondary | ICD-10-CM | POA: Diagnosis not present

## 2021-06-05 DIAGNOSIS — R82998 Other abnormal findings in urine: Secondary | ICD-10-CM | POA: Diagnosis not present

## 2021-06-05 DIAGNOSIS — Z4932 Encounter for adequacy testing for peritoneal dialysis: Secondary | ICD-10-CM | POA: Diagnosis not present

## 2021-06-05 DIAGNOSIS — K769 Liver disease, unspecified: Secondary | ICD-10-CM | POA: Diagnosis not present

## 2021-06-05 DIAGNOSIS — E440 Moderate protein-calorie malnutrition: Secondary | ICD-10-CM | POA: Diagnosis not present

## 2021-06-05 DIAGNOSIS — Z992 Dependence on renal dialysis: Secondary | ICD-10-CM | POA: Diagnosis not present

## 2021-06-05 DIAGNOSIS — D631 Anemia in chronic kidney disease: Secondary | ICD-10-CM | POA: Diagnosis not present

## 2021-06-05 DIAGNOSIS — Z79899 Other long term (current) drug therapy: Secondary | ICD-10-CM | POA: Diagnosis not present

## 2021-06-05 DIAGNOSIS — D509 Iron deficiency anemia, unspecified: Secondary | ICD-10-CM | POA: Diagnosis not present

## 2021-06-06 DIAGNOSIS — D509 Iron deficiency anemia, unspecified: Secondary | ICD-10-CM | POA: Diagnosis not present

## 2021-06-06 DIAGNOSIS — N2581 Secondary hyperparathyroidism of renal origin: Secondary | ICD-10-CM | POA: Diagnosis not present

## 2021-06-06 DIAGNOSIS — R82998 Other abnormal findings in urine: Secondary | ICD-10-CM | POA: Diagnosis not present

## 2021-06-06 DIAGNOSIS — Z79899 Other long term (current) drug therapy: Secondary | ICD-10-CM | POA: Diagnosis not present

## 2021-06-06 DIAGNOSIS — K769 Liver disease, unspecified: Secondary | ICD-10-CM | POA: Diagnosis not present

## 2021-06-06 DIAGNOSIS — Z4932 Encounter for adequacy testing for peritoneal dialysis: Secondary | ICD-10-CM | POA: Diagnosis not present

## 2021-06-06 DIAGNOSIS — Z992 Dependence on renal dialysis: Secondary | ICD-10-CM | POA: Diagnosis not present

## 2021-06-06 DIAGNOSIS — E440 Moderate protein-calorie malnutrition: Secondary | ICD-10-CM | POA: Diagnosis not present

## 2021-06-06 DIAGNOSIS — N186 End stage renal disease: Secondary | ICD-10-CM | POA: Diagnosis not present

## 2021-06-06 DIAGNOSIS — D631 Anemia in chronic kidney disease: Secondary | ICD-10-CM | POA: Diagnosis not present

## 2021-06-07 DIAGNOSIS — N186 End stage renal disease: Secondary | ICD-10-CM | POA: Diagnosis not present

## 2021-06-07 DIAGNOSIS — Z992 Dependence on renal dialysis: Secondary | ICD-10-CM | POA: Diagnosis not present

## 2021-06-07 DIAGNOSIS — R82998 Other abnormal findings in urine: Secondary | ICD-10-CM | POA: Diagnosis not present

## 2021-06-07 DIAGNOSIS — K769 Liver disease, unspecified: Secondary | ICD-10-CM | POA: Diagnosis not present

## 2021-06-07 DIAGNOSIS — N2581 Secondary hyperparathyroidism of renal origin: Secondary | ICD-10-CM | POA: Diagnosis not present

## 2021-06-07 DIAGNOSIS — Z79899 Other long term (current) drug therapy: Secondary | ICD-10-CM | POA: Diagnosis not present

## 2021-06-07 DIAGNOSIS — D631 Anemia in chronic kidney disease: Secondary | ICD-10-CM | POA: Diagnosis not present

## 2021-06-07 DIAGNOSIS — D509 Iron deficiency anemia, unspecified: Secondary | ICD-10-CM | POA: Diagnosis not present

## 2021-06-07 DIAGNOSIS — E440 Moderate protein-calorie malnutrition: Secondary | ICD-10-CM | POA: Diagnosis not present

## 2021-06-07 DIAGNOSIS — Z4932 Encounter for adequacy testing for peritoneal dialysis: Secondary | ICD-10-CM | POA: Diagnosis not present

## 2021-06-08 DIAGNOSIS — D631 Anemia in chronic kidney disease: Secondary | ICD-10-CM | POA: Diagnosis not present

## 2021-06-08 DIAGNOSIS — Z79899 Other long term (current) drug therapy: Secondary | ICD-10-CM | POA: Diagnosis not present

## 2021-06-08 DIAGNOSIS — N2581 Secondary hyperparathyroidism of renal origin: Secondary | ICD-10-CM | POA: Diagnosis not present

## 2021-06-08 DIAGNOSIS — Z4932 Encounter for adequacy testing for peritoneal dialysis: Secondary | ICD-10-CM | POA: Diagnosis not present

## 2021-06-08 DIAGNOSIS — R82998 Other abnormal findings in urine: Secondary | ICD-10-CM | POA: Diagnosis not present

## 2021-06-08 DIAGNOSIS — E440 Moderate protein-calorie malnutrition: Secondary | ICD-10-CM | POA: Diagnosis not present

## 2021-06-08 DIAGNOSIS — Z992 Dependence on renal dialysis: Secondary | ICD-10-CM | POA: Diagnosis not present

## 2021-06-08 DIAGNOSIS — D509 Iron deficiency anemia, unspecified: Secondary | ICD-10-CM | POA: Diagnosis not present

## 2021-06-08 DIAGNOSIS — K769 Liver disease, unspecified: Secondary | ICD-10-CM | POA: Diagnosis not present

## 2021-06-08 DIAGNOSIS — N186 End stage renal disease: Secondary | ICD-10-CM | POA: Diagnosis not present

## 2021-06-09 DIAGNOSIS — R82998 Other abnormal findings in urine: Secondary | ICD-10-CM | POA: Diagnosis not present

## 2021-06-09 DIAGNOSIS — Z992 Dependence on renal dialysis: Secondary | ICD-10-CM | POA: Diagnosis not present

## 2021-06-09 DIAGNOSIS — D509 Iron deficiency anemia, unspecified: Secondary | ICD-10-CM | POA: Diagnosis not present

## 2021-06-09 DIAGNOSIS — K769 Liver disease, unspecified: Secondary | ICD-10-CM | POA: Diagnosis not present

## 2021-06-09 DIAGNOSIS — E440 Moderate protein-calorie malnutrition: Secondary | ICD-10-CM | POA: Diagnosis not present

## 2021-06-09 DIAGNOSIS — Z79899 Other long term (current) drug therapy: Secondary | ICD-10-CM | POA: Diagnosis not present

## 2021-06-09 DIAGNOSIS — D631 Anemia in chronic kidney disease: Secondary | ICD-10-CM | POA: Diagnosis not present

## 2021-06-09 DIAGNOSIS — Z4932 Encounter for adequacy testing for peritoneal dialysis: Secondary | ICD-10-CM | POA: Diagnosis not present

## 2021-06-09 DIAGNOSIS — N186 End stage renal disease: Secondary | ICD-10-CM | POA: Diagnosis not present

## 2021-06-09 DIAGNOSIS — N2581 Secondary hyperparathyroidism of renal origin: Secondary | ICD-10-CM | POA: Diagnosis not present

## 2021-06-10 DIAGNOSIS — D509 Iron deficiency anemia, unspecified: Secondary | ICD-10-CM | POA: Diagnosis not present

## 2021-06-10 DIAGNOSIS — E440 Moderate protein-calorie malnutrition: Secondary | ICD-10-CM | POA: Diagnosis not present

## 2021-06-10 DIAGNOSIS — N2581 Secondary hyperparathyroidism of renal origin: Secondary | ICD-10-CM | POA: Diagnosis not present

## 2021-06-10 DIAGNOSIS — Z79899 Other long term (current) drug therapy: Secondary | ICD-10-CM | POA: Diagnosis not present

## 2021-06-10 DIAGNOSIS — K769 Liver disease, unspecified: Secondary | ICD-10-CM | POA: Diagnosis not present

## 2021-06-10 DIAGNOSIS — Z992 Dependence on renal dialysis: Secondary | ICD-10-CM | POA: Diagnosis not present

## 2021-06-10 DIAGNOSIS — Z4932 Encounter for adequacy testing for peritoneal dialysis: Secondary | ICD-10-CM | POA: Diagnosis not present

## 2021-06-10 DIAGNOSIS — R82998 Other abnormal findings in urine: Secondary | ICD-10-CM | POA: Diagnosis not present

## 2021-06-10 DIAGNOSIS — D631 Anemia in chronic kidney disease: Secondary | ICD-10-CM | POA: Diagnosis not present

## 2021-06-10 DIAGNOSIS — N186 End stage renal disease: Secondary | ICD-10-CM | POA: Diagnosis not present

## 2021-06-11 DIAGNOSIS — D509 Iron deficiency anemia, unspecified: Secondary | ICD-10-CM | POA: Diagnosis not present

## 2021-06-11 DIAGNOSIS — N186 End stage renal disease: Secondary | ICD-10-CM | POA: Diagnosis not present

## 2021-06-11 DIAGNOSIS — Z4932 Encounter for adequacy testing for peritoneal dialysis: Secondary | ICD-10-CM | POA: Diagnosis not present

## 2021-06-11 DIAGNOSIS — D631 Anemia in chronic kidney disease: Secondary | ICD-10-CM | POA: Diagnosis not present

## 2021-06-11 DIAGNOSIS — K769 Liver disease, unspecified: Secondary | ICD-10-CM | POA: Diagnosis not present

## 2021-06-11 DIAGNOSIS — Z79899 Other long term (current) drug therapy: Secondary | ICD-10-CM | POA: Diagnosis not present

## 2021-06-11 DIAGNOSIS — E440 Moderate protein-calorie malnutrition: Secondary | ICD-10-CM | POA: Diagnosis not present

## 2021-06-11 DIAGNOSIS — R82998 Other abnormal findings in urine: Secondary | ICD-10-CM | POA: Diagnosis not present

## 2021-06-11 DIAGNOSIS — N2581 Secondary hyperparathyroidism of renal origin: Secondary | ICD-10-CM | POA: Diagnosis not present

## 2021-06-11 DIAGNOSIS — Z992 Dependence on renal dialysis: Secondary | ICD-10-CM | POA: Diagnosis not present

## 2021-06-12 DIAGNOSIS — K769 Liver disease, unspecified: Secondary | ICD-10-CM | POA: Diagnosis not present

## 2021-06-12 DIAGNOSIS — Z79899 Other long term (current) drug therapy: Secondary | ICD-10-CM | POA: Diagnosis not present

## 2021-06-12 DIAGNOSIS — E440 Moderate protein-calorie malnutrition: Secondary | ICD-10-CM | POA: Diagnosis not present

## 2021-06-12 DIAGNOSIS — R82998 Other abnormal findings in urine: Secondary | ICD-10-CM | POA: Diagnosis not present

## 2021-06-12 DIAGNOSIS — Z992 Dependence on renal dialysis: Secondary | ICD-10-CM | POA: Diagnosis not present

## 2021-06-12 DIAGNOSIS — Z4932 Encounter for adequacy testing for peritoneal dialysis: Secondary | ICD-10-CM | POA: Diagnosis not present

## 2021-06-12 DIAGNOSIS — D509 Iron deficiency anemia, unspecified: Secondary | ICD-10-CM | POA: Diagnosis not present

## 2021-06-12 DIAGNOSIS — N186 End stage renal disease: Secondary | ICD-10-CM | POA: Diagnosis not present

## 2021-06-12 DIAGNOSIS — D631 Anemia in chronic kidney disease: Secondary | ICD-10-CM | POA: Diagnosis not present

## 2021-06-12 DIAGNOSIS — N2581 Secondary hyperparathyroidism of renal origin: Secondary | ICD-10-CM | POA: Diagnosis not present

## 2021-06-13 DIAGNOSIS — Z992 Dependence on renal dialysis: Secondary | ICD-10-CM | POA: Diagnosis not present

## 2021-06-13 DIAGNOSIS — Z79899 Other long term (current) drug therapy: Secondary | ICD-10-CM | POA: Diagnosis not present

## 2021-06-13 DIAGNOSIS — Z4932 Encounter for adequacy testing for peritoneal dialysis: Secondary | ICD-10-CM | POA: Diagnosis not present

## 2021-06-13 DIAGNOSIS — E440 Moderate protein-calorie malnutrition: Secondary | ICD-10-CM | POA: Diagnosis not present

## 2021-06-13 DIAGNOSIS — D631 Anemia in chronic kidney disease: Secondary | ICD-10-CM | POA: Diagnosis not present

## 2021-06-13 DIAGNOSIS — R82998 Other abnormal findings in urine: Secondary | ICD-10-CM | POA: Diagnosis not present

## 2021-06-13 DIAGNOSIS — D509 Iron deficiency anemia, unspecified: Secondary | ICD-10-CM | POA: Diagnosis not present

## 2021-06-13 DIAGNOSIS — N186 End stage renal disease: Secondary | ICD-10-CM | POA: Diagnosis not present

## 2021-06-13 DIAGNOSIS — K769 Liver disease, unspecified: Secondary | ICD-10-CM | POA: Diagnosis not present

## 2021-06-13 DIAGNOSIS — N2581 Secondary hyperparathyroidism of renal origin: Secondary | ICD-10-CM | POA: Diagnosis not present

## 2021-06-14 DIAGNOSIS — K769 Liver disease, unspecified: Secondary | ICD-10-CM | POA: Diagnosis not present

## 2021-06-14 DIAGNOSIS — N2581 Secondary hyperparathyroidism of renal origin: Secondary | ICD-10-CM | POA: Diagnosis not present

## 2021-06-14 DIAGNOSIS — E440 Moderate protein-calorie malnutrition: Secondary | ICD-10-CM | POA: Diagnosis not present

## 2021-06-14 DIAGNOSIS — Z4932 Encounter for adequacy testing for peritoneal dialysis: Secondary | ICD-10-CM | POA: Diagnosis not present

## 2021-06-14 DIAGNOSIS — R82998 Other abnormal findings in urine: Secondary | ICD-10-CM | POA: Diagnosis not present

## 2021-06-14 DIAGNOSIS — D509 Iron deficiency anemia, unspecified: Secondary | ICD-10-CM | POA: Diagnosis not present

## 2021-06-14 DIAGNOSIS — Z992 Dependence on renal dialysis: Secondary | ICD-10-CM | POA: Diagnosis not present

## 2021-06-14 DIAGNOSIS — D631 Anemia in chronic kidney disease: Secondary | ICD-10-CM | POA: Diagnosis not present

## 2021-06-14 DIAGNOSIS — Z79899 Other long term (current) drug therapy: Secondary | ICD-10-CM | POA: Diagnosis not present

## 2021-06-14 DIAGNOSIS — N186 End stage renal disease: Secondary | ICD-10-CM | POA: Diagnosis not present

## 2021-06-15 DIAGNOSIS — R82998 Other abnormal findings in urine: Secondary | ICD-10-CM | POA: Diagnosis not present

## 2021-06-15 DIAGNOSIS — Z992 Dependence on renal dialysis: Secondary | ICD-10-CM | POA: Diagnosis not present

## 2021-06-15 DIAGNOSIS — D631 Anemia in chronic kidney disease: Secondary | ICD-10-CM | POA: Diagnosis not present

## 2021-06-15 DIAGNOSIS — N2581 Secondary hyperparathyroidism of renal origin: Secondary | ICD-10-CM | POA: Diagnosis not present

## 2021-06-15 DIAGNOSIS — Z79899 Other long term (current) drug therapy: Secondary | ICD-10-CM | POA: Diagnosis not present

## 2021-06-15 DIAGNOSIS — N186 End stage renal disease: Secondary | ICD-10-CM | POA: Diagnosis not present

## 2021-06-15 DIAGNOSIS — K769 Liver disease, unspecified: Secondary | ICD-10-CM | POA: Diagnosis not present

## 2021-06-15 DIAGNOSIS — Z4932 Encounter for adequacy testing for peritoneal dialysis: Secondary | ICD-10-CM | POA: Diagnosis not present

## 2021-06-15 DIAGNOSIS — D509 Iron deficiency anemia, unspecified: Secondary | ICD-10-CM | POA: Diagnosis not present

## 2021-06-15 DIAGNOSIS — E440 Moderate protein-calorie malnutrition: Secondary | ICD-10-CM | POA: Diagnosis not present

## 2021-06-16 ENCOUNTER — Ambulatory Visit (INDEPENDENT_AMBULATORY_CARE_PROVIDER_SITE_OTHER): Payer: Medicare Other | Admitting: Cardiovascular Disease

## 2021-06-16 ENCOUNTER — Encounter: Payer: Self-pay | Admitting: Cardiovascular Disease

## 2021-06-16 ENCOUNTER — Other Ambulatory Visit: Payer: Self-pay

## 2021-06-16 VITALS — BP 104/56 | HR 79 | Ht 70.0 in | Wt 191.0 lb

## 2021-06-16 DIAGNOSIS — N2581 Secondary hyperparathyroidism of renal origin: Secondary | ICD-10-CM | POA: Diagnosis not present

## 2021-06-16 DIAGNOSIS — K769 Liver disease, unspecified: Secondary | ICD-10-CM | POA: Diagnosis not present

## 2021-06-16 DIAGNOSIS — Z4932 Encounter for adequacy testing for peritoneal dialysis: Secondary | ICD-10-CM | POA: Diagnosis not present

## 2021-06-16 DIAGNOSIS — I4811 Longstanding persistent atrial fibrillation: Secondary | ICD-10-CM

## 2021-06-16 DIAGNOSIS — I429 Cardiomyopathy, unspecified: Secondary | ICD-10-CM | POA: Diagnosis not present

## 2021-06-16 DIAGNOSIS — Z79899 Other long term (current) drug therapy: Secondary | ICD-10-CM | POA: Diagnosis not present

## 2021-06-16 DIAGNOSIS — I10 Essential (primary) hypertension: Secondary | ICD-10-CM | POA: Diagnosis not present

## 2021-06-16 DIAGNOSIS — E440 Moderate protein-calorie malnutrition: Secondary | ICD-10-CM | POA: Diagnosis not present

## 2021-06-16 DIAGNOSIS — D631 Anemia in chronic kidney disease: Secondary | ICD-10-CM | POA: Diagnosis not present

## 2021-06-16 DIAGNOSIS — R82998 Other abnormal findings in urine: Secondary | ICD-10-CM | POA: Diagnosis not present

## 2021-06-16 DIAGNOSIS — N186 End stage renal disease: Secondary | ICD-10-CM | POA: Diagnosis not present

## 2021-06-16 DIAGNOSIS — Z992 Dependence on renal dialysis: Secondary | ICD-10-CM | POA: Diagnosis not present

## 2021-06-16 DIAGNOSIS — D509 Iron deficiency anemia, unspecified: Secondary | ICD-10-CM | POA: Diagnosis not present

## 2021-06-16 NOTE — Assessment & Plan Note (Signed)
History of essential hypertension a blood pressure measured today 104/56.  He is on metoprolol.

## 2021-06-16 NOTE — Progress Notes (Signed)
06/16/2021 DAO MEMMOTT   05/10/1933  [id number]  Primary Physician Chesley Noon, MD Primary Cardiologist: Lorretta Harp MD FACP, [place], [place], [place]  HPI:  David Barton is a 87 y.o.  mild to moderately overweight married Caucasian male father of 2, grandfather one grandchild who I initially saw during a hospitalization in November 2017 during episode of sepsis.  I last saw him in the office 10/28/2020.  He has a history of remote tobacco abuse. He underwent [employer] which he retired from an age 83. He has lost 50 pounds intentionally and feels much better. There is a history of colon cancer. When I saw him in November he was staring episode of sepsis and mildly elevated troponins. Also had some PSVT. His EF at that time was 30-35% which is ultimately improved this 55-60% by 2-D echo done recently. He is completely asymptomatic. Since I saw him March 2018 he has gained 40 pounds.  He does admit to dietary indiscretion.  He has had increasing dyspnea on exertion over the last several weeks which improved with the ministration of low-dose oral diuretics.  Edema improved as well.  He says that he watches his salt.  He denies chest pain. Since I saw him in the office back in January of this year he has had a fairly eventful year from a medical point of view.  He was placed on dialysis in June and since that time he feels clinically improved.  He has had acute on chronic systolic and diastolic heart failure, thoracentesis and TEE guided cardioversion in March of this year.  He was seen by Kerin Ransom in July which time he was thought to be in sinus rhythm on amiodarone and Eliquis oral anticoagulation.  He currently exercises daily walking 12 minutes a day.  He denies chest pain or shortness of breath.   Since I saw him in the office 7 months ago he continues to do well.  He exercises on a treadmill several minutes a day.  He continues to do peritoneal dialysis at home.  He remains in A. fib  rate controlled on low-dose Eliquis oral anticoagulation.  He denies chest pain or shortness of breath.   Current Meds  Medication Sig   albuterol (PROAIR HFA) 108 (90 Base) MCG/ACT inhaler 2 puffs every 4 hours as needed only  if your can't catch your breath (Patient taking differently: 2 puffs every 4 hours as needed only  if your can't catch your breath)   ELIQUIS 2.5 MG TABS tablet TAKE 1 TABLET(2.5 MG) BY MOUTH TWICE DAILY   famotidine (PEPCID) 20 MG tablet One after supper   metoprolol succinate (TOPROL XL) 25 MG 24 hr tablet Take 1 tablet (25 mg total) by mouth daily.   multivitamin (RENA-VIT) TABS tablet Take 1 tablet by mouth at bedtime.   pantoprazole (PROTONIX) 40 MG tablet Take 1 tablet (40 mg total) by mouth daily. Take 30-60 min before first meal of the day   sevelamer carbonate (RENVELA) 800 MG tablet Take 1 tablet (800 mg total) by mouth 3 (three) times daily with meals.   TRELEGY ELLIPTA 100-62.5-25 MCG/INH AEPB INHALE 1 PUFF INTO THE LUNGS DAILY   zolpidem (AMBIEN) 10 MG tablet Take 10 mg by mouth at bedtime as needed.     Allergies  Allergen Reactions   Amlodipine Swelling   Tizanidine     Other reaction(s): hypotension    Social History   Socioeconomic History   Marital status:  Married    Spouse name: Not on file   Number of children: 3   Years of education: Not on file   Highest education level: Not on file  Occupational History   Occupation: Retired- office work/business owner  Tobacco Use   Smoking status: Former    Packs/day: 1.00    Years: 20.00    Pack years: 20.00    Types: Cigarettes    Quit date: 12/20/1981    Years since quitting: 39.5   Smokeless tobacco: Never  Vaping Use   Vaping Use: Never used  Substance and Sexual Activity   Alcohol use: Yes    Alcohol/week: 7.0 standard drinks    Types: 7 Standard drinks or equivalent per week    Comment: states he drinks 3-4 days a week, "a couple" on those occasions but does not further quaniity.    Drug use: No   Sexual activity: Never  Other Topics Concern   Not on file  Social History Narrative   Not on file   Social Determinants of Health   Financial Resource Strain: Not on file  Food Insecurity: Not on file  Transportation Needs: Not on file  Physical Activity: Not on file  Stress: Not on file  Social Connections: Not on file  Intimate Partner Violence: Not on file     Review of Systems: General: negative for chills, fever, night sweats or weight changes.  Cardiovascular: negative for chest pain, dyspnea on exertion, edema, orthopnea, palpitations, paroxysmal nocturnal dyspnea or shortness of breath Dermatological: negative for rash Respiratory: negative for cough or wheezing Urologic: negative for hematuria Abdominal: negative for nausea, vomiting, diarrhea, bright red blood per rectum, melena, or hematemesis Neurologic: negative for visual changes, syncope, or dizziness All other systems reviewed and are otherwise negative except as noted above.    Blood pressure (!) 104/56, pulse 79, height 5\' 10"  (1.778 m), weight 191 lb (86.6 kg).  General appearance: alert and no distress Neck: no adenopathy, no carotid bruit, no JVD, supple, symmetrical, trachea midline, and thyroid not enlarged, symmetric, no tenderness/mass/nodules Lungs: clear to auscultation bilaterally Heart: irregularly irregular rhythm Extremities: extremities normal, atraumatic, no cyanosis or edema Pulses: 2+ and symmetric Skin: Skin color, texture, turgor normal. No rashes or lesions Neurologic: Grossly normal  EKG atrial fibrillation with a ventricular sponsor of 79 and low limb voltage with incomplete right bundle branch block.  I personally reviewed this EKG.  ASSESSMENT AND PLAN:   Essential hypertension History of essential hypertension a blood pressure measured today 104/56.  He is on metoprolol.  Cardiomyopathy ([place]) History of nonischemic cardiomyopathy with an EF of 45 to 50% by 2D  echo performed 05/21/2019.  Longstanding persistent atrial fibrillation ([name]) History of longstanding persistent A. fib on low-dose Eliquis oral anticoagulation.     Lorretta Harp MD FACP,FACC,FAHA, [facility] 06/16/2021 12:00 PM

## 2021-06-16 NOTE — Assessment & Plan Note (Signed)
History of nonischemic cardiomyopathy with an EF of 45 to 50% by 2D echo performed 05/21/2019.

## 2021-06-16 NOTE — Assessment & Plan Note (Signed)
History of longstanding persistent A. fib on low-dose Eliquis oral anticoagulation.

## 2021-06-16 NOTE — Patient Instructions (Signed)
Medication Instructions:  Your Physician recommend you continue on your current medication as directed.    *If you need a refill on your cardiac medications before your next appointment, please call your pharmacy*   Lab Work: None ordered today   Testing/Procedures: None ordered today   Follow-Up: At [facility], you and your health needs are our priority.  As part of our continuing mission to provide you with exceptional heart care, we have created designated Provider Care Teams.  These Care Teams include your primary Cardiologist (physician) and Advanced Practice Providers (APPs -  Physician Assistants and Nurse Practitioners) who all work together to provide you with the care you need, when you need it.  We recommend signing up for the patient portal called "MyChart".  Sign up information is provided on this After Visit Summary.  MyChart is used to connect with patients for Virtual Visits (Telemedicine).  Patients are able to view lab/test results, encounter notes, upcoming appointments, etc.  Non-urgent messages can be sent to your provider as well.   To learn more about what you can do with MyChart, go to [email or web address].    Your next appointment:   1 year(s)  The format for your next appointment:   In Person  Provider:   Quay Burow, MD

## 2021-06-17 DIAGNOSIS — K769 Liver disease, unspecified: Secondary | ICD-10-CM | POA: Diagnosis not present

## 2021-06-17 DIAGNOSIS — R82998 Other abnormal findings in urine: Secondary | ICD-10-CM | POA: Diagnosis not present

## 2021-06-17 DIAGNOSIS — D631 Anemia in chronic kidney disease: Secondary | ICD-10-CM | POA: Diagnosis not present

## 2021-06-17 DIAGNOSIS — Z4932 Encounter for adequacy testing for peritoneal dialysis: Secondary | ICD-10-CM | POA: Diagnosis not present

## 2021-06-17 DIAGNOSIS — N186 End stage renal disease: Secondary | ICD-10-CM | POA: Diagnosis not present

## 2021-06-17 DIAGNOSIS — Z992 Dependence on renal dialysis: Secondary | ICD-10-CM | POA: Diagnosis not present

## 2021-06-17 DIAGNOSIS — Z79899 Other long term (current) drug therapy: Secondary | ICD-10-CM | POA: Diagnosis not present

## 2021-06-17 DIAGNOSIS — E440 Moderate protein-calorie malnutrition: Secondary | ICD-10-CM | POA: Diagnosis not present

## 2021-06-17 DIAGNOSIS — D509 Iron deficiency anemia, unspecified: Secondary | ICD-10-CM | POA: Diagnosis not present

## 2021-06-17 DIAGNOSIS — N2581 Secondary hyperparathyroidism of renal origin: Secondary | ICD-10-CM | POA: Diagnosis not present

## 2021-06-18 DIAGNOSIS — K769 Liver disease, unspecified: Secondary | ICD-10-CM | POA: Diagnosis not present

## 2021-06-18 DIAGNOSIS — D631 Anemia in chronic kidney disease: Secondary | ICD-10-CM | POA: Diagnosis not present

## 2021-06-18 DIAGNOSIS — E440 Moderate protein-calorie malnutrition: Secondary | ICD-10-CM | POA: Diagnosis not present

## 2021-06-18 DIAGNOSIS — I129 Hypertensive chronic kidney disease with stage 1 through stage 4 chronic kidney disease, or unspecified chronic kidney disease: Secondary | ICD-10-CM | POA: Diagnosis not present

## 2021-06-18 DIAGNOSIS — D509 Iron deficiency anemia, unspecified: Secondary | ICD-10-CM | POA: Diagnosis not present

## 2021-06-18 DIAGNOSIS — R82998 Other abnormal findings in urine: Secondary | ICD-10-CM | POA: Diagnosis not present

## 2021-06-18 DIAGNOSIS — Z79899 Other long term (current) drug therapy: Secondary | ICD-10-CM | POA: Diagnosis not present

## 2021-06-18 DIAGNOSIS — Z992 Dependence on renal dialysis: Secondary | ICD-10-CM | POA: Diagnosis not present

## 2021-06-18 DIAGNOSIS — Z4932 Encounter for adequacy testing for peritoneal dialysis: Secondary | ICD-10-CM | POA: Diagnosis not present

## 2021-06-18 DIAGNOSIS — N186 End stage renal disease: Secondary | ICD-10-CM | POA: Diagnosis not present

## 2021-06-18 DIAGNOSIS — N2581 Secondary hyperparathyroidism of renal origin: Secondary | ICD-10-CM | POA: Diagnosis not present

## 2021-06-19 DIAGNOSIS — R82998 Other abnormal findings in urine: Secondary | ICD-10-CM | POA: Diagnosis not present

## 2021-06-19 DIAGNOSIS — D509 Iron deficiency anemia, unspecified: Secondary | ICD-10-CM | POA: Diagnosis not present

## 2021-06-19 DIAGNOSIS — K769 Liver disease, unspecified: Secondary | ICD-10-CM | POA: Diagnosis not present

## 2021-06-19 DIAGNOSIS — Z992 Dependence on renal dialysis: Secondary | ICD-10-CM | POA: Diagnosis not present

## 2021-06-19 DIAGNOSIS — Z4932 Encounter for adequacy testing for peritoneal dialysis: Secondary | ICD-10-CM | POA: Diagnosis not present

## 2021-06-19 DIAGNOSIS — N2581 Secondary hyperparathyroidism of renal origin: Secondary | ICD-10-CM | POA: Diagnosis not present

## 2021-06-19 DIAGNOSIS — N186 End stage renal disease: Secondary | ICD-10-CM | POA: Diagnosis not present

## 2021-06-19 DIAGNOSIS — N2589 Other disorders resulting from impaired renal tubular function: Secondary | ICD-10-CM | POA: Diagnosis not present

## 2021-06-19 DIAGNOSIS — D631 Anemia in chronic kidney disease: Secondary | ICD-10-CM | POA: Diagnosis not present

## 2021-06-20 DIAGNOSIS — N186 End stage renal disease: Secondary | ICD-10-CM | POA: Diagnosis not present

## 2021-06-20 DIAGNOSIS — R82998 Other abnormal findings in urine: Secondary | ICD-10-CM | POA: Diagnosis not present

## 2021-06-20 DIAGNOSIS — Z992 Dependence on renal dialysis: Secondary | ICD-10-CM | POA: Diagnosis not present

## 2021-06-20 DIAGNOSIS — N2589 Other disorders resulting from impaired renal tubular function: Secondary | ICD-10-CM | POA: Diagnosis not present

## 2021-06-20 DIAGNOSIS — Z4932 Encounter for adequacy testing for peritoneal dialysis: Secondary | ICD-10-CM | POA: Diagnosis not present

## 2021-06-20 DIAGNOSIS — D631 Anemia in chronic kidney disease: Secondary | ICD-10-CM | POA: Diagnosis not present

## 2021-06-20 DIAGNOSIS — D509 Iron deficiency anemia, unspecified: Secondary | ICD-10-CM | POA: Diagnosis not present

## 2021-06-20 DIAGNOSIS — N2581 Secondary hyperparathyroidism of renal origin: Secondary | ICD-10-CM | POA: Diagnosis not present

## 2021-06-20 DIAGNOSIS — K769 Liver disease, unspecified: Secondary | ICD-10-CM | POA: Diagnosis not present

## 2021-06-21 DIAGNOSIS — D631 Anemia in chronic kidney disease: Secondary | ICD-10-CM | POA: Diagnosis not present

## 2021-06-21 DIAGNOSIS — Z4932 Encounter for adequacy testing for peritoneal dialysis: Secondary | ICD-10-CM | POA: Diagnosis not present

## 2021-06-21 DIAGNOSIS — N2589 Other disorders resulting from impaired renal tubular function: Secondary | ICD-10-CM | POA: Diagnosis not present

## 2021-06-21 DIAGNOSIS — R82998 Other abnormal findings in urine: Secondary | ICD-10-CM | POA: Diagnosis not present

## 2021-06-21 DIAGNOSIS — N2581 Secondary hyperparathyroidism of renal origin: Secondary | ICD-10-CM | POA: Diagnosis not present

## 2021-06-21 DIAGNOSIS — K769 Liver disease, unspecified: Secondary | ICD-10-CM | POA: Diagnosis not present

## 2021-06-21 DIAGNOSIS — Z992 Dependence on renal dialysis: Secondary | ICD-10-CM | POA: Diagnosis not present

## 2021-06-21 DIAGNOSIS — N186 End stage renal disease: Secondary | ICD-10-CM | POA: Diagnosis not present

## 2021-06-21 DIAGNOSIS — D509 Iron deficiency anemia, unspecified: Secondary | ICD-10-CM | POA: Diagnosis not present

## 2021-06-22 DIAGNOSIS — Z992 Dependence on renal dialysis: Secondary | ICD-10-CM | POA: Diagnosis not present

## 2021-06-22 DIAGNOSIS — D509 Iron deficiency anemia, unspecified: Secondary | ICD-10-CM | POA: Diagnosis not present

## 2021-06-22 DIAGNOSIS — N2581 Secondary hyperparathyroidism of renal origin: Secondary | ICD-10-CM | POA: Diagnosis not present

## 2021-06-22 DIAGNOSIS — N186 End stage renal disease: Secondary | ICD-10-CM | POA: Diagnosis not present

## 2021-06-22 DIAGNOSIS — Z4932 Encounter for adequacy testing for peritoneal dialysis: Secondary | ICD-10-CM | POA: Diagnosis not present

## 2021-06-22 DIAGNOSIS — D631 Anemia in chronic kidney disease: Secondary | ICD-10-CM | POA: Diagnosis not present

## 2021-06-22 DIAGNOSIS — K769 Liver disease, unspecified: Secondary | ICD-10-CM | POA: Diagnosis not present

## 2021-06-22 DIAGNOSIS — R82998 Other abnormal findings in urine: Secondary | ICD-10-CM | POA: Diagnosis not present

## 2021-06-22 DIAGNOSIS — N2589 Other disorders resulting from impaired renal tubular function: Secondary | ICD-10-CM | POA: Diagnosis not present

## 2021-06-23 DIAGNOSIS — D509 Iron deficiency anemia, unspecified: Secondary | ICD-10-CM | POA: Diagnosis not present

## 2021-06-23 DIAGNOSIS — D631 Anemia in chronic kidney disease: Secondary | ICD-10-CM | POA: Diagnosis not present

## 2021-06-23 DIAGNOSIS — N186 End stage renal disease: Secondary | ICD-10-CM | POA: Diagnosis not present

## 2021-06-23 DIAGNOSIS — R82998 Other abnormal findings in urine: Secondary | ICD-10-CM | POA: Diagnosis not present

## 2021-06-23 DIAGNOSIS — N2581 Secondary hyperparathyroidism of renal origin: Secondary | ICD-10-CM | POA: Diagnosis not present

## 2021-06-23 DIAGNOSIS — N2589 Other disorders resulting from impaired renal tubular function: Secondary | ICD-10-CM | POA: Diagnosis not present

## 2021-06-23 DIAGNOSIS — K769 Liver disease, unspecified: Secondary | ICD-10-CM | POA: Diagnosis not present

## 2021-06-23 DIAGNOSIS — Z4932 Encounter for adequacy testing for peritoneal dialysis: Secondary | ICD-10-CM | POA: Diagnosis not present

## 2021-06-23 DIAGNOSIS — Z992 Dependence on renal dialysis: Secondary | ICD-10-CM | POA: Diagnosis not present

## 2021-06-24 DIAGNOSIS — Z992 Dependence on renal dialysis: Secondary | ICD-10-CM | POA: Diagnosis not present

## 2021-06-24 DIAGNOSIS — H9190 Unspecified hearing loss, unspecified ear: Secondary | ICD-10-CM | POA: Diagnosis not present

## 2021-06-24 DIAGNOSIS — D631 Anemia in chronic kidney disease: Secondary | ICD-10-CM | POA: Diagnosis not present

## 2021-06-24 DIAGNOSIS — J449 Chronic obstructive pulmonary disease, unspecified: Secondary | ICD-10-CM | POA: Diagnosis not present

## 2021-06-24 DIAGNOSIS — N2589 Other disorders resulting from impaired renal tubular function: Secondary | ICD-10-CM | POA: Diagnosis not present

## 2021-06-24 DIAGNOSIS — Z4932 Encounter for adequacy testing for peritoneal dialysis: Secondary | ICD-10-CM | POA: Diagnosis not present

## 2021-06-24 DIAGNOSIS — N2581 Secondary hyperparathyroidism of renal origin: Secondary | ICD-10-CM | POA: Diagnosis not present

## 2021-06-24 DIAGNOSIS — D509 Iron deficiency anemia, unspecified: Secondary | ICD-10-CM | POA: Diagnosis not present

## 2021-06-24 DIAGNOSIS — N186 End stage renal disease: Secondary | ICD-10-CM | POA: Diagnosis not present

## 2021-06-24 DIAGNOSIS — R82998 Other abnormal findings in urine: Secondary | ICD-10-CM | POA: Diagnosis not present

## 2021-06-24 DIAGNOSIS — K769 Liver disease, unspecified: Secondary | ICD-10-CM | POA: Diagnosis not present

## 2021-06-24 DIAGNOSIS — I480 Paroxysmal atrial fibrillation: Secondary | ICD-10-CM | POA: Diagnosis not present

## 2021-06-24 DIAGNOSIS — F5101 Primary insomnia: Secondary | ICD-10-CM | POA: Diagnosis not present

## 2021-06-25 DIAGNOSIS — D631 Anemia in chronic kidney disease: Secondary | ICD-10-CM | POA: Diagnosis not present

## 2021-06-25 DIAGNOSIS — Z992 Dependence on renal dialysis: Secondary | ICD-10-CM | POA: Diagnosis not present

## 2021-06-25 DIAGNOSIS — N2581 Secondary hyperparathyroidism of renal origin: Secondary | ICD-10-CM | POA: Diagnosis not present

## 2021-06-25 DIAGNOSIS — K769 Liver disease, unspecified: Secondary | ICD-10-CM | POA: Diagnosis not present

## 2021-06-25 DIAGNOSIS — D509 Iron deficiency anemia, unspecified: Secondary | ICD-10-CM | POA: Diagnosis not present

## 2021-06-25 DIAGNOSIS — Z4932 Encounter for adequacy testing for peritoneal dialysis: Secondary | ICD-10-CM | POA: Diagnosis not present

## 2021-06-25 DIAGNOSIS — N2589 Other disorders resulting from impaired renal tubular function: Secondary | ICD-10-CM | POA: Diagnosis not present

## 2021-06-25 DIAGNOSIS — N186 End stage renal disease: Secondary | ICD-10-CM | POA: Diagnosis not present

## 2021-06-25 DIAGNOSIS — R82998 Other abnormal findings in urine: Secondary | ICD-10-CM | POA: Diagnosis not present

## 2021-06-26 DIAGNOSIS — Z992 Dependence on renal dialysis: Secondary | ICD-10-CM | POA: Diagnosis not present

## 2021-06-26 DIAGNOSIS — R82998 Other abnormal findings in urine: Secondary | ICD-10-CM | POA: Diagnosis not present

## 2021-06-26 DIAGNOSIS — D631 Anemia in chronic kidney disease: Secondary | ICD-10-CM | POA: Diagnosis not present

## 2021-06-26 DIAGNOSIS — D509 Iron deficiency anemia, unspecified: Secondary | ICD-10-CM | POA: Diagnosis not present

## 2021-06-26 DIAGNOSIS — K769 Liver disease, unspecified: Secondary | ICD-10-CM | POA: Diagnosis not present

## 2021-06-26 DIAGNOSIS — N2589 Other disorders resulting from impaired renal tubular function: Secondary | ICD-10-CM | POA: Diagnosis not present

## 2021-06-26 DIAGNOSIS — N2581 Secondary hyperparathyroidism of renal origin: Secondary | ICD-10-CM | POA: Diagnosis not present

## 2021-06-26 DIAGNOSIS — N186 End stage renal disease: Secondary | ICD-10-CM | POA: Diagnosis not present

## 2021-06-26 DIAGNOSIS — Z4932 Encounter for adequacy testing for peritoneal dialysis: Secondary | ICD-10-CM | POA: Diagnosis not present

## 2021-06-27 DIAGNOSIS — D631 Anemia in chronic kidney disease: Secondary | ICD-10-CM | POA: Diagnosis not present

## 2021-06-27 DIAGNOSIS — N2589 Other disorders resulting from impaired renal tubular function: Secondary | ICD-10-CM | POA: Diagnosis not present

## 2021-06-27 DIAGNOSIS — N186 End stage renal disease: Secondary | ICD-10-CM | POA: Diagnosis not present

## 2021-06-27 DIAGNOSIS — Z4932 Encounter for adequacy testing for peritoneal dialysis: Secondary | ICD-10-CM | POA: Diagnosis not present

## 2021-06-27 DIAGNOSIS — N2581 Secondary hyperparathyroidism of renal origin: Secondary | ICD-10-CM | POA: Diagnosis not present

## 2021-06-27 DIAGNOSIS — K769 Liver disease, unspecified: Secondary | ICD-10-CM | POA: Diagnosis not present

## 2021-06-27 DIAGNOSIS — R82998 Other abnormal findings in urine: Secondary | ICD-10-CM | POA: Diagnosis not present

## 2021-06-27 DIAGNOSIS — Z992 Dependence on renal dialysis: Secondary | ICD-10-CM | POA: Diagnosis not present

## 2021-06-27 DIAGNOSIS — D509 Iron deficiency anemia, unspecified: Secondary | ICD-10-CM | POA: Diagnosis not present

## 2021-06-28 DIAGNOSIS — N186 End stage renal disease: Secondary | ICD-10-CM | POA: Diagnosis not present

## 2021-06-28 DIAGNOSIS — N2589 Other disorders resulting from impaired renal tubular function: Secondary | ICD-10-CM | POA: Diagnosis not present

## 2021-06-28 DIAGNOSIS — N2581 Secondary hyperparathyroidism of renal origin: Secondary | ICD-10-CM | POA: Diagnosis not present

## 2021-06-28 DIAGNOSIS — Z992 Dependence on renal dialysis: Secondary | ICD-10-CM | POA: Diagnosis not present

## 2021-06-28 DIAGNOSIS — D631 Anemia in chronic kidney disease: Secondary | ICD-10-CM | POA: Diagnosis not present

## 2021-06-28 DIAGNOSIS — Z4932 Encounter for adequacy testing for peritoneal dialysis: Secondary | ICD-10-CM | POA: Diagnosis not present

## 2021-06-28 DIAGNOSIS — D509 Iron deficiency anemia, unspecified: Secondary | ICD-10-CM | POA: Diagnosis not present

## 2021-06-28 DIAGNOSIS — K769 Liver disease, unspecified: Secondary | ICD-10-CM | POA: Diagnosis not present

## 2021-06-28 DIAGNOSIS — R82998 Other abnormal findings in urine: Secondary | ICD-10-CM | POA: Diagnosis not present

## 2021-06-29 DIAGNOSIS — K769 Liver disease, unspecified: Secondary | ICD-10-CM | POA: Diagnosis not present

## 2021-06-29 DIAGNOSIS — N2589 Other disorders resulting from impaired renal tubular function: Secondary | ICD-10-CM | POA: Diagnosis not present

## 2021-06-29 DIAGNOSIS — D631 Anemia in chronic kidney disease: Secondary | ICD-10-CM | POA: Diagnosis not present

## 2021-06-29 DIAGNOSIS — R82998 Other abnormal findings in urine: Secondary | ICD-10-CM | POA: Diagnosis not present

## 2021-06-29 DIAGNOSIS — Z992 Dependence on renal dialysis: Secondary | ICD-10-CM | POA: Diagnosis not present

## 2021-06-29 DIAGNOSIS — Z4932 Encounter for adequacy testing for peritoneal dialysis: Secondary | ICD-10-CM | POA: Diagnosis not present

## 2021-06-29 DIAGNOSIS — N2581 Secondary hyperparathyroidism of renal origin: Secondary | ICD-10-CM | POA: Diagnosis not present

## 2021-06-29 DIAGNOSIS — N186 End stage renal disease: Secondary | ICD-10-CM | POA: Diagnosis not present

## 2021-06-29 DIAGNOSIS — D509 Iron deficiency anemia, unspecified: Secondary | ICD-10-CM | POA: Diagnosis not present

## 2021-06-30 DIAGNOSIS — N2589 Other disorders resulting from impaired renal tubular function: Secondary | ICD-10-CM | POA: Diagnosis not present

## 2021-06-30 DIAGNOSIS — N2581 Secondary hyperparathyroidism of renal origin: Secondary | ICD-10-CM | POA: Diagnosis not present

## 2021-06-30 DIAGNOSIS — K769 Liver disease, unspecified: Secondary | ICD-10-CM | POA: Diagnosis not present

## 2021-06-30 DIAGNOSIS — D509 Iron deficiency anemia, unspecified: Secondary | ICD-10-CM | POA: Diagnosis not present

## 2021-06-30 DIAGNOSIS — D631 Anemia in chronic kidney disease: Secondary | ICD-10-CM | POA: Diagnosis not present

## 2021-06-30 DIAGNOSIS — N186 End stage renal disease: Secondary | ICD-10-CM | POA: Diagnosis not present

## 2021-06-30 DIAGNOSIS — Z4932 Encounter for adequacy testing for peritoneal dialysis: Secondary | ICD-10-CM | POA: Diagnosis not present

## 2021-06-30 DIAGNOSIS — R82998 Other abnormal findings in urine: Secondary | ICD-10-CM | POA: Diagnosis not present

## 2021-06-30 DIAGNOSIS — Z992 Dependence on renal dialysis: Secondary | ICD-10-CM | POA: Diagnosis not present

## 2021-07-01 DIAGNOSIS — K769 Liver disease, unspecified: Secondary | ICD-10-CM | POA: Diagnosis not present

## 2021-07-01 DIAGNOSIS — R82998 Other abnormal findings in urine: Secondary | ICD-10-CM | POA: Diagnosis not present

## 2021-07-01 DIAGNOSIS — Z4932 Encounter for adequacy testing for peritoneal dialysis: Secondary | ICD-10-CM | POA: Diagnosis not present

## 2021-07-01 DIAGNOSIS — D509 Iron deficiency anemia, unspecified: Secondary | ICD-10-CM | POA: Diagnosis not present

## 2021-07-01 DIAGNOSIS — D631 Anemia in chronic kidney disease: Secondary | ICD-10-CM | POA: Diagnosis not present

## 2021-07-01 DIAGNOSIS — N2581 Secondary hyperparathyroidism of renal origin: Secondary | ICD-10-CM | POA: Diagnosis not present

## 2021-07-01 DIAGNOSIS — N2589 Other disorders resulting from impaired renal tubular function: Secondary | ICD-10-CM | POA: Diagnosis not present

## 2021-07-01 DIAGNOSIS — Z992 Dependence on renal dialysis: Secondary | ICD-10-CM | POA: Diagnosis not present

## 2021-07-01 DIAGNOSIS — N186 End stage renal disease: Secondary | ICD-10-CM | POA: Diagnosis not present

## 2021-07-02 DIAGNOSIS — D631 Anemia in chronic kidney disease: Secondary | ICD-10-CM | POA: Diagnosis not present

## 2021-07-02 DIAGNOSIS — N2589 Other disorders resulting from impaired renal tubular function: Secondary | ICD-10-CM | POA: Diagnosis not present

## 2021-07-02 DIAGNOSIS — D509 Iron deficiency anemia, unspecified: Secondary | ICD-10-CM | POA: Diagnosis not present

## 2021-07-02 DIAGNOSIS — K769 Liver disease, unspecified: Secondary | ICD-10-CM | POA: Diagnosis not present

## 2021-07-02 DIAGNOSIS — N2581 Secondary hyperparathyroidism of renal origin: Secondary | ICD-10-CM | POA: Diagnosis not present

## 2021-07-02 DIAGNOSIS — Z4932 Encounter for adequacy testing for peritoneal dialysis: Secondary | ICD-10-CM | POA: Diagnosis not present

## 2021-07-02 DIAGNOSIS — N186 End stage renal disease: Secondary | ICD-10-CM | POA: Diagnosis not present

## 2021-07-02 DIAGNOSIS — Z992 Dependence on renal dialysis: Secondary | ICD-10-CM | POA: Diagnosis not present

## 2021-07-02 DIAGNOSIS — R82998 Other abnormal findings in urine: Secondary | ICD-10-CM | POA: Diagnosis not present

## 2021-07-03 DIAGNOSIS — N186 End stage renal disease: Secondary | ICD-10-CM | POA: Diagnosis not present

## 2021-07-03 DIAGNOSIS — N2581 Secondary hyperparathyroidism of renal origin: Secondary | ICD-10-CM | POA: Diagnosis not present

## 2021-07-03 DIAGNOSIS — D509 Iron deficiency anemia, unspecified: Secondary | ICD-10-CM | POA: Diagnosis not present

## 2021-07-03 DIAGNOSIS — Z4932 Encounter for adequacy testing for peritoneal dialysis: Secondary | ICD-10-CM | POA: Diagnosis not present

## 2021-07-03 DIAGNOSIS — Z992 Dependence on renal dialysis: Secondary | ICD-10-CM | POA: Diagnosis not present

## 2021-07-03 DIAGNOSIS — D631 Anemia in chronic kidney disease: Secondary | ICD-10-CM | POA: Diagnosis not present

## 2021-07-03 DIAGNOSIS — K769 Liver disease, unspecified: Secondary | ICD-10-CM | POA: Diagnosis not present

## 2021-07-03 DIAGNOSIS — R82998 Other abnormal findings in urine: Secondary | ICD-10-CM | POA: Diagnosis not present

## 2021-07-03 DIAGNOSIS — N2589 Other disorders resulting from impaired renal tubular function: Secondary | ICD-10-CM | POA: Diagnosis not present

## 2021-07-04 DIAGNOSIS — K769 Liver disease, unspecified: Secondary | ICD-10-CM | POA: Diagnosis not present

## 2021-07-04 DIAGNOSIS — Z4932 Encounter for adequacy testing for peritoneal dialysis: Secondary | ICD-10-CM | POA: Diagnosis not present

## 2021-07-04 DIAGNOSIS — R82998 Other abnormal findings in urine: Secondary | ICD-10-CM | POA: Diagnosis not present

## 2021-07-04 DIAGNOSIS — N186 End stage renal disease: Secondary | ICD-10-CM | POA: Diagnosis not present

## 2021-07-04 DIAGNOSIS — D631 Anemia in chronic kidney disease: Secondary | ICD-10-CM | POA: Diagnosis not present

## 2021-07-04 DIAGNOSIS — D509 Iron deficiency anemia, unspecified: Secondary | ICD-10-CM | POA: Diagnosis not present

## 2021-07-04 DIAGNOSIS — N2581 Secondary hyperparathyroidism of renal origin: Secondary | ICD-10-CM | POA: Diagnosis not present

## 2021-07-04 DIAGNOSIS — N2589 Other disorders resulting from impaired renal tubular function: Secondary | ICD-10-CM | POA: Diagnosis not present

## 2021-07-04 DIAGNOSIS — Z992 Dependence on renal dialysis: Secondary | ICD-10-CM | POA: Diagnosis not present

## 2021-07-05 DIAGNOSIS — Z4932 Encounter for adequacy testing for peritoneal dialysis: Secondary | ICD-10-CM | POA: Diagnosis not present

## 2021-07-05 DIAGNOSIS — K769 Liver disease, unspecified: Secondary | ICD-10-CM | POA: Diagnosis not present

## 2021-07-05 DIAGNOSIS — Z992 Dependence on renal dialysis: Secondary | ICD-10-CM | POA: Diagnosis not present

## 2021-07-05 DIAGNOSIS — D631 Anemia in chronic kidney disease: Secondary | ICD-10-CM | POA: Diagnosis not present

## 2021-07-05 DIAGNOSIS — R82998 Other abnormal findings in urine: Secondary | ICD-10-CM | POA: Diagnosis not present

## 2021-07-05 DIAGNOSIS — N2589 Other disorders resulting from impaired renal tubular function: Secondary | ICD-10-CM | POA: Diagnosis not present

## 2021-07-05 DIAGNOSIS — N2581 Secondary hyperparathyroidism of renal origin: Secondary | ICD-10-CM | POA: Diagnosis not present

## 2021-07-05 DIAGNOSIS — N186 End stage renal disease: Secondary | ICD-10-CM | POA: Diagnosis not present

## 2021-07-05 DIAGNOSIS — D509 Iron deficiency anemia, unspecified: Secondary | ICD-10-CM | POA: Diagnosis not present

## 2021-07-06 DIAGNOSIS — D631 Anemia in chronic kidney disease: Secondary | ICD-10-CM | POA: Diagnosis not present

## 2021-07-06 DIAGNOSIS — K769 Liver disease, unspecified: Secondary | ICD-10-CM | POA: Diagnosis not present

## 2021-07-06 DIAGNOSIS — D509 Iron deficiency anemia, unspecified: Secondary | ICD-10-CM | POA: Diagnosis not present

## 2021-07-06 DIAGNOSIS — Z992 Dependence on renal dialysis: Secondary | ICD-10-CM | POA: Diagnosis not present

## 2021-07-06 DIAGNOSIS — R82998 Other abnormal findings in urine: Secondary | ICD-10-CM | POA: Diagnosis not present

## 2021-07-06 DIAGNOSIS — Z4932 Encounter for adequacy testing for peritoneal dialysis: Secondary | ICD-10-CM | POA: Diagnosis not present

## 2021-07-06 DIAGNOSIS — N186 End stage renal disease: Secondary | ICD-10-CM | POA: Diagnosis not present

## 2021-07-06 DIAGNOSIS — N2589 Other disorders resulting from impaired renal tubular function: Secondary | ICD-10-CM | POA: Diagnosis not present

## 2021-07-06 DIAGNOSIS — N2581 Secondary hyperparathyroidism of renal origin: Secondary | ICD-10-CM | POA: Diagnosis not present

## 2021-07-07 DIAGNOSIS — Z4932 Encounter for adequacy testing for peritoneal dialysis: Secondary | ICD-10-CM | POA: Diagnosis not present

## 2021-07-07 DIAGNOSIS — R82998 Other abnormal findings in urine: Secondary | ICD-10-CM | POA: Diagnosis not present

## 2021-07-07 DIAGNOSIS — D631 Anemia in chronic kidney disease: Secondary | ICD-10-CM | POA: Diagnosis not present

## 2021-07-07 DIAGNOSIS — N186 End stage renal disease: Secondary | ICD-10-CM | POA: Diagnosis not present

## 2021-07-07 DIAGNOSIS — N2589 Other disorders resulting from impaired renal tubular function: Secondary | ICD-10-CM | POA: Diagnosis not present

## 2021-07-07 DIAGNOSIS — N2581 Secondary hyperparathyroidism of renal origin: Secondary | ICD-10-CM | POA: Diagnosis not present

## 2021-07-07 DIAGNOSIS — Z992 Dependence on renal dialysis: Secondary | ICD-10-CM | POA: Diagnosis not present

## 2021-07-07 DIAGNOSIS — K769 Liver disease, unspecified: Secondary | ICD-10-CM | POA: Diagnosis not present

## 2021-07-07 DIAGNOSIS — D509 Iron deficiency anemia, unspecified: Secondary | ICD-10-CM | POA: Diagnosis not present

## 2021-07-08 DIAGNOSIS — D631 Anemia in chronic kidney disease: Secondary | ICD-10-CM | POA: Diagnosis not present

## 2021-07-08 DIAGNOSIS — N186 End stage renal disease: Secondary | ICD-10-CM | POA: Diagnosis not present

## 2021-07-08 DIAGNOSIS — R82998 Other abnormal findings in urine: Secondary | ICD-10-CM | POA: Diagnosis not present

## 2021-07-08 DIAGNOSIS — Z4932 Encounter for adequacy testing for peritoneal dialysis: Secondary | ICD-10-CM | POA: Diagnosis not present

## 2021-07-08 DIAGNOSIS — N2581 Secondary hyperparathyroidism of renal origin: Secondary | ICD-10-CM | POA: Diagnosis not present

## 2021-07-08 DIAGNOSIS — Z992 Dependence on renal dialysis: Secondary | ICD-10-CM | POA: Diagnosis not present

## 2021-07-08 DIAGNOSIS — K769 Liver disease, unspecified: Secondary | ICD-10-CM | POA: Diagnosis not present

## 2021-07-08 DIAGNOSIS — N2589 Other disorders resulting from impaired renal tubular function: Secondary | ICD-10-CM | POA: Diagnosis not present

## 2021-07-08 DIAGNOSIS — D509 Iron deficiency anemia, unspecified: Secondary | ICD-10-CM | POA: Diagnosis not present

## 2021-07-09 DIAGNOSIS — N2589 Other disorders resulting from impaired renal tubular function: Secondary | ICD-10-CM | POA: Diagnosis not present

## 2021-07-09 DIAGNOSIS — R82998 Other abnormal findings in urine: Secondary | ICD-10-CM | POA: Diagnosis not present

## 2021-07-09 DIAGNOSIS — Z992 Dependence on renal dialysis: Secondary | ICD-10-CM | POA: Diagnosis not present

## 2021-07-09 DIAGNOSIS — D509 Iron deficiency anemia, unspecified: Secondary | ICD-10-CM | POA: Diagnosis not present

## 2021-07-09 DIAGNOSIS — D631 Anemia in chronic kidney disease: Secondary | ICD-10-CM | POA: Diagnosis not present

## 2021-07-09 DIAGNOSIS — N2581 Secondary hyperparathyroidism of renal origin: Secondary | ICD-10-CM | POA: Diagnosis not present

## 2021-07-09 DIAGNOSIS — K769 Liver disease, unspecified: Secondary | ICD-10-CM | POA: Diagnosis not present

## 2021-07-09 DIAGNOSIS — Z4932 Encounter for adequacy testing for peritoneal dialysis: Secondary | ICD-10-CM | POA: Diagnosis not present

## 2021-07-09 DIAGNOSIS — N186 End stage renal disease: Secondary | ICD-10-CM | POA: Diagnosis not present

## 2021-07-10 DIAGNOSIS — N186 End stage renal disease: Secondary | ICD-10-CM | POA: Diagnosis not present

## 2021-07-10 DIAGNOSIS — N2581 Secondary hyperparathyroidism of renal origin: Secondary | ICD-10-CM | POA: Diagnosis not present

## 2021-07-10 DIAGNOSIS — N2589 Other disorders resulting from impaired renal tubular function: Secondary | ICD-10-CM | POA: Diagnosis not present

## 2021-07-10 DIAGNOSIS — D509 Iron deficiency anemia, unspecified: Secondary | ICD-10-CM | POA: Diagnosis not present

## 2021-07-10 DIAGNOSIS — D631 Anemia in chronic kidney disease: Secondary | ICD-10-CM | POA: Diagnosis not present

## 2021-07-10 DIAGNOSIS — K769 Liver disease, unspecified: Secondary | ICD-10-CM | POA: Diagnosis not present

## 2021-07-10 DIAGNOSIS — R82998 Other abnormal findings in urine: Secondary | ICD-10-CM | POA: Diagnosis not present

## 2021-07-10 DIAGNOSIS — Z4932 Encounter for adequacy testing for peritoneal dialysis: Secondary | ICD-10-CM | POA: Diagnosis not present

## 2021-07-10 DIAGNOSIS — Z992 Dependence on renal dialysis: Secondary | ICD-10-CM | POA: Diagnosis not present

## 2021-07-11 DIAGNOSIS — R82998 Other abnormal findings in urine: Secondary | ICD-10-CM | POA: Diagnosis not present

## 2021-07-11 DIAGNOSIS — Z4932 Encounter for adequacy testing for peritoneal dialysis: Secondary | ICD-10-CM | POA: Diagnosis not present

## 2021-07-11 DIAGNOSIS — K769 Liver disease, unspecified: Secondary | ICD-10-CM | POA: Diagnosis not present

## 2021-07-11 DIAGNOSIS — Z992 Dependence on renal dialysis: Secondary | ICD-10-CM | POA: Diagnosis not present

## 2021-07-11 DIAGNOSIS — N2589 Other disorders resulting from impaired renal tubular function: Secondary | ICD-10-CM | POA: Diagnosis not present

## 2021-07-11 DIAGNOSIS — D509 Iron deficiency anemia, unspecified: Secondary | ICD-10-CM | POA: Diagnosis not present

## 2021-07-11 DIAGNOSIS — D631 Anemia in chronic kidney disease: Secondary | ICD-10-CM | POA: Diagnosis not present

## 2021-07-11 DIAGNOSIS — N2581 Secondary hyperparathyroidism of renal origin: Secondary | ICD-10-CM | POA: Diagnosis not present

## 2021-07-11 DIAGNOSIS — N186 End stage renal disease: Secondary | ICD-10-CM | POA: Diagnosis not present

## 2021-07-12 DIAGNOSIS — D631 Anemia in chronic kidney disease: Secondary | ICD-10-CM | POA: Diagnosis not present

## 2021-07-12 DIAGNOSIS — N2581 Secondary hyperparathyroidism of renal origin: Secondary | ICD-10-CM | POA: Diagnosis not present

## 2021-07-12 DIAGNOSIS — K769 Liver disease, unspecified: Secondary | ICD-10-CM | POA: Diagnosis not present

## 2021-07-12 DIAGNOSIS — N186 End stage renal disease: Secondary | ICD-10-CM | POA: Diagnosis not present

## 2021-07-12 DIAGNOSIS — R82998 Other abnormal findings in urine: Secondary | ICD-10-CM | POA: Diagnosis not present

## 2021-07-12 DIAGNOSIS — Z992 Dependence on renal dialysis: Secondary | ICD-10-CM | POA: Diagnosis not present

## 2021-07-12 DIAGNOSIS — N2589 Other disorders resulting from impaired renal tubular function: Secondary | ICD-10-CM | POA: Diagnosis not present

## 2021-07-12 DIAGNOSIS — Z4932 Encounter for adequacy testing for peritoneal dialysis: Secondary | ICD-10-CM | POA: Diagnosis not present

## 2021-07-12 DIAGNOSIS — D509 Iron deficiency anemia, unspecified: Secondary | ICD-10-CM | POA: Diagnosis not present

## 2021-07-13 DIAGNOSIS — N186 End stage renal disease: Secondary | ICD-10-CM | POA: Diagnosis not present

## 2021-07-13 DIAGNOSIS — N2581 Secondary hyperparathyroidism of renal origin: Secondary | ICD-10-CM | POA: Diagnosis not present

## 2021-07-13 DIAGNOSIS — N2589 Other disorders resulting from impaired renal tubular function: Secondary | ICD-10-CM | POA: Diagnosis not present

## 2021-07-13 DIAGNOSIS — Z992 Dependence on renal dialysis: Secondary | ICD-10-CM | POA: Diagnosis not present

## 2021-07-13 DIAGNOSIS — D631 Anemia in chronic kidney disease: Secondary | ICD-10-CM | POA: Diagnosis not present

## 2021-07-13 DIAGNOSIS — R82998 Other abnormal findings in urine: Secondary | ICD-10-CM | POA: Diagnosis not present

## 2021-07-13 DIAGNOSIS — D509 Iron deficiency anemia, unspecified: Secondary | ICD-10-CM | POA: Diagnosis not present

## 2021-07-13 DIAGNOSIS — K769 Liver disease, unspecified: Secondary | ICD-10-CM | POA: Diagnosis not present

## 2021-07-13 DIAGNOSIS — Z4932 Encounter for adequacy testing for peritoneal dialysis: Secondary | ICD-10-CM | POA: Diagnosis not present

## 2021-07-14 DIAGNOSIS — N2581 Secondary hyperparathyroidism of renal origin: Secondary | ICD-10-CM | POA: Diagnosis not present

## 2021-07-14 DIAGNOSIS — N186 End stage renal disease: Secondary | ICD-10-CM | POA: Diagnosis not present

## 2021-07-14 DIAGNOSIS — R82998 Other abnormal findings in urine: Secondary | ICD-10-CM | POA: Diagnosis not present

## 2021-07-14 DIAGNOSIS — N2589 Other disorders resulting from impaired renal tubular function: Secondary | ICD-10-CM | POA: Diagnosis not present

## 2021-07-14 DIAGNOSIS — K769 Liver disease, unspecified: Secondary | ICD-10-CM | POA: Diagnosis not present

## 2021-07-14 DIAGNOSIS — Z4932 Encounter for adequacy testing for peritoneal dialysis: Secondary | ICD-10-CM | POA: Diagnosis not present

## 2021-07-14 DIAGNOSIS — D631 Anemia in chronic kidney disease: Secondary | ICD-10-CM | POA: Diagnosis not present

## 2021-07-14 DIAGNOSIS — D509 Iron deficiency anemia, unspecified: Secondary | ICD-10-CM | POA: Diagnosis not present

## 2021-07-14 DIAGNOSIS — Z992 Dependence on renal dialysis: Secondary | ICD-10-CM | POA: Diagnosis not present

## 2021-07-15 DIAGNOSIS — N2589 Other disorders resulting from impaired renal tubular function: Secondary | ICD-10-CM | POA: Diagnosis not present

## 2021-07-15 DIAGNOSIS — D509 Iron deficiency anemia, unspecified: Secondary | ICD-10-CM | POA: Diagnosis not present

## 2021-07-15 DIAGNOSIS — D631 Anemia in chronic kidney disease: Secondary | ICD-10-CM | POA: Diagnosis not present

## 2021-07-15 DIAGNOSIS — R82998 Other abnormal findings in urine: Secondary | ICD-10-CM | POA: Diagnosis not present

## 2021-07-15 DIAGNOSIS — N186 End stage renal disease: Secondary | ICD-10-CM | POA: Diagnosis not present

## 2021-07-15 DIAGNOSIS — Z4932 Encounter for adequacy testing for peritoneal dialysis: Secondary | ICD-10-CM | POA: Diagnosis not present

## 2021-07-15 DIAGNOSIS — Z992 Dependence on renal dialysis: Secondary | ICD-10-CM | POA: Diagnosis not present

## 2021-07-15 DIAGNOSIS — K769 Liver disease, unspecified: Secondary | ICD-10-CM | POA: Diagnosis not present

## 2021-07-15 DIAGNOSIS — N2581 Secondary hyperparathyroidism of renal origin: Secondary | ICD-10-CM | POA: Diagnosis not present

## 2021-07-16 DIAGNOSIS — D509 Iron deficiency anemia, unspecified: Secondary | ICD-10-CM | POA: Diagnosis not present

## 2021-07-16 DIAGNOSIS — N2581 Secondary hyperparathyroidism of renal origin: Secondary | ICD-10-CM | POA: Diagnosis not present

## 2021-07-16 DIAGNOSIS — Z992 Dependence on renal dialysis: Secondary | ICD-10-CM | POA: Diagnosis not present

## 2021-07-16 DIAGNOSIS — K769 Liver disease, unspecified: Secondary | ICD-10-CM | POA: Diagnosis not present

## 2021-07-16 DIAGNOSIS — R82998 Other abnormal findings in urine: Secondary | ICD-10-CM | POA: Diagnosis not present

## 2021-07-16 DIAGNOSIS — Z4932 Encounter for adequacy testing for peritoneal dialysis: Secondary | ICD-10-CM | POA: Diagnosis not present

## 2021-07-16 DIAGNOSIS — N2589 Other disorders resulting from impaired renal tubular function: Secondary | ICD-10-CM | POA: Diagnosis not present

## 2021-07-16 DIAGNOSIS — D631 Anemia in chronic kidney disease: Secondary | ICD-10-CM | POA: Diagnosis not present

## 2021-07-16 DIAGNOSIS — N186 End stage renal disease: Secondary | ICD-10-CM | POA: Diagnosis not present

## 2021-07-17 DIAGNOSIS — Z992 Dependence on renal dialysis: Secondary | ICD-10-CM | POA: Diagnosis not present

## 2021-07-17 DIAGNOSIS — D631 Anemia in chronic kidney disease: Secondary | ICD-10-CM | POA: Diagnosis not present

## 2021-07-17 DIAGNOSIS — Z4932 Encounter for adequacy testing for peritoneal dialysis: Secondary | ICD-10-CM | POA: Diagnosis not present

## 2021-07-17 DIAGNOSIS — R82998 Other abnormal findings in urine: Secondary | ICD-10-CM | POA: Diagnosis not present

## 2021-07-17 DIAGNOSIS — K769 Liver disease, unspecified: Secondary | ICD-10-CM | POA: Diagnosis not present

## 2021-07-17 DIAGNOSIS — N186 End stage renal disease: Secondary | ICD-10-CM | POA: Diagnosis not present

## 2021-07-17 DIAGNOSIS — D509 Iron deficiency anemia, unspecified: Secondary | ICD-10-CM | POA: Diagnosis not present

## 2021-07-17 DIAGNOSIS — N2589 Other disorders resulting from impaired renal tubular function: Secondary | ICD-10-CM | POA: Diagnosis not present

## 2021-07-17 DIAGNOSIS — N2581 Secondary hyperparathyroidism of renal origin: Secondary | ICD-10-CM | POA: Diagnosis not present

## 2021-07-18 DIAGNOSIS — Z992 Dependence on renal dialysis: Secondary | ICD-10-CM | POA: Diagnosis not present

## 2021-07-18 DIAGNOSIS — Z4932 Encounter for adequacy testing for peritoneal dialysis: Secondary | ICD-10-CM | POA: Diagnosis not present

## 2021-07-18 DIAGNOSIS — N2589 Other disorders resulting from impaired renal tubular function: Secondary | ICD-10-CM | POA: Diagnosis not present

## 2021-07-18 DIAGNOSIS — D509 Iron deficiency anemia, unspecified: Secondary | ICD-10-CM | POA: Diagnosis not present

## 2021-07-18 DIAGNOSIS — N186 End stage renal disease: Secondary | ICD-10-CM | POA: Diagnosis not present

## 2021-07-18 DIAGNOSIS — K769 Liver disease, unspecified: Secondary | ICD-10-CM | POA: Diagnosis not present

## 2021-07-18 DIAGNOSIS — D631 Anemia in chronic kidney disease: Secondary | ICD-10-CM | POA: Diagnosis not present

## 2021-07-18 DIAGNOSIS — R82998 Other abnormal findings in urine: Secondary | ICD-10-CM | POA: Diagnosis not present

## 2021-07-18 DIAGNOSIS — N2581 Secondary hyperparathyroidism of renal origin: Secondary | ICD-10-CM | POA: Diagnosis not present

## 2021-07-19 DIAGNOSIS — D509 Iron deficiency anemia, unspecified: Secondary | ICD-10-CM | POA: Diagnosis not present

## 2021-07-19 DIAGNOSIS — Z992 Dependence on renal dialysis: Secondary | ICD-10-CM | POA: Diagnosis not present

## 2021-07-19 DIAGNOSIS — R82998 Other abnormal findings in urine: Secondary | ICD-10-CM | POA: Diagnosis not present

## 2021-07-19 DIAGNOSIS — D631 Anemia in chronic kidney disease: Secondary | ICD-10-CM | POA: Diagnosis not present

## 2021-07-19 DIAGNOSIS — Z4932 Encounter for adequacy testing for peritoneal dialysis: Secondary | ICD-10-CM | POA: Diagnosis not present

## 2021-07-19 DIAGNOSIS — I129 Hypertensive chronic kidney disease with stage 1 through stage 4 chronic kidney disease, or unspecified chronic kidney disease: Secondary | ICD-10-CM | POA: Diagnosis not present

## 2021-07-19 DIAGNOSIS — N2589 Other disorders resulting from impaired renal tubular function: Secondary | ICD-10-CM | POA: Diagnosis not present

## 2021-07-19 DIAGNOSIS — N2581 Secondary hyperparathyroidism of renal origin: Secondary | ICD-10-CM | POA: Diagnosis not present

## 2021-07-19 DIAGNOSIS — K769 Liver disease, unspecified: Secondary | ICD-10-CM | POA: Diagnosis not present

## 2021-07-19 DIAGNOSIS — N186 End stage renal disease: Secondary | ICD-10-CM | POA: Diagnosis not present

## 2021-07-20 DIAGNOSIS — N2581 Secondary hyperparathyroidism of renal origin: Secondary | ICD-10-CM | POA: Diagnosis not present

## 2021-07-20 DIAGNOSIS — Z992 Dependence on renal dialysis: Secondary | ICD-10-CM | POA: Diagnosis not present

## 2021-07-20 DIAGNOSIS — N186 End stage renal disease: Secondary | ICD-10-CM | POA: Diagnosis not present

## 2021-07-20 DIAGNOSIS — D631 Anemia in chronic kidney disease: Secondary | ICD-10-CM | POA: Diagnosis not present

## 2021-07-21 DIAGNOSIS — N186 End stage renal disease: Secondary | ICD-10-CM | POA: Diagnosis not present

## 2021-07-21 DIAGNOSIS — N2581 Secondary hyperparathyroidism of renal origin: Secondary | ICD-10-CM | POA: Diagnosis not present

## 2021-07-21 DIAGNOSIS — D631 Anemia in chronic kidney disease: Secondary | ICD-10-CM | POA: Diagnosis not present

## 2021-07-21 DIAGNOSIS — Z992 Dependence on renal dialysis: Secondary | ICD-10-CM | POA: Diagnosis not present

## 2021-07-22 DIAGNOSIS — N2581 Secondary hyperparathyroidism of renal origin: Secondary | ICD-10-CM | POA: Diagnosis not present

## 2021-07-22 DIAGNOSIS — Z992 Dependence on renal dialysis: Secondary | ICD-10-CM | POA: Diagnosis not present

## 2021-07-22 DIAGNOSIS — N186 End stage renal disease: Secondary | ICD-10-CM | POA: Diagnosis not present

## 2021-07-22 DIAGNOSIS — D631 Anemia in chronic kidney disease: Secondary | ICD-10-CM | POA: Diagnosis not present

## 2021-07-23 DIAGNOSIS — D631 Anemia in chronic kidney disease: Secondary | ICD-10-CM | POA: Diagnosis not present

## 2021-07-23 DIAGNOSIS — Z992 Dependence on renal dialysis: Secondary | ICD-10-CM | POA: Diagnosis not present

## 2021-07-23 DIAGNOSIS — N2581 Secondary hyperparathyroidism of renal origin: Secondary | ICD-10-CM | POA: Diagnosis not present

## 2021-07-23 DIAGNOSIS — N186 End stage renal disease: Secondary | ICD-10-CM | POA: Diagnosis not present

## 2021-07-24 DIAGNOSIS — N2581 Secondary hyperparathyroidism of renal origin: Secondary | ICD-10-CM | POA: Diagnosis not present

## 2021-07-24 DIAGNOSIS — Z992 Dependence on renal dialysis: Secondary | ICD-10-CM | POA: Diagnosis not present

## 2021-07-24 DIAGNOSIS — N186 End stage renal disease: Secondary | ICD-10-CM | POA: Diagnosis not present

## 2021-07-24 DIAGNOSIS — D631 Anemia in chronic kidney disease: Secondary | ICD-10-CM | POA: Diagnosis not present

## 2021-07-25 DIAGNOSIS — Z992 Dependence on renal dialysis: Secondary | ICD-10-CM | POA: Diagnosis not present

## 2021-07-25 DIAGNOSIS — N186 End stage renal disease: Secondary | ICD-10-CM | POA: Diagnosis not present

## 2021-07-25 DIAGNOSIS — D631 Anemia in chronic kidney disease: Secondary | ICD-10-CM | POA: Diagnosis not present

## 2021-07-25 DIAGNOSIS — N2581 Secondary hyperparathyroidism of renal origin: Secondary | ICD-10-CM | POA: Diagnosis not present

## 2021-07-26 DIAGNOSIS — D631 Anemia in chronic kidney disease: Secondary | ICD-10-CM | POA: Diagnosis not present

## 2021-07-26 DIAGNOSIS — Z992 Dependence on renal dialysis: Secondary | ICD-10-CM | POA: Diagnosis not present

## 2021-07-26 DIAGNOSIS — N2581 Secondary hyperparathyroidism of renal origin: Secondary | ICD-10-CM | POA: Diagnosis not present

## 2021-07-26 DIAGNOSIS — N186 End stage renal disease: Secondary | ICD-10-CM | POA: Diagnosis not present

## 2021-07-27 DIAGNOSIS — N186 End stage renal disease: Secondary | ICD-10-CM | POA: Diagnosis not present

## 2021-07-27 DIAGNOSIS — Z992 Dependence on renal dialysis: Secondary | ICD-10-CM | POA: Diagnosis not present

## 2021-07-27 DIAGNOSIS — D631 Anemia in chronic kidney disease: Secondary | ICD-10-CM | POA: Diagnosis not present

## 2021-07-27 DIAGNOSIS — N2581 Secondary hyperparathyroidism of renal origin: Secondary | ICD-10-CM | POA: Diagnosis not present

## 2021-07-28 DIAGNOSIS — D631 Anemia in chronic kidney disease: Secondary | ICD-10-CM | POA: Diagnosis not present

## 2021-07-28 DIAGNOSIS — N2581 Secondary hyperparathyroidism of renal origin: Secondary | ICD-10-CM | POA: Diagnosis not present

## 2021-07-28 DIAGNOSIS — Z992 Dependence on renal dialysis: Secondary | ICD-10-CM | POA: Diagnosis not present

## 2021-07-28 DIAGNOSIS — N186 End stage renal disease: Secondary | ICD-10-CM | POA: Diagnosis not present

## 2021-07-29 DIAGNOSIS — D631 Anemia in chronic kidney disease: Secondary | ICD-10-CM | POA: Diagnosis not present

## 2021-07-29 DIAGNOSIS — N186 End stage renal disease: Secondary | ICD-10-CM | POA: Diagnosis not present

## 2021-07-29 DIAGNOSIS — N2581 Secondary hyperparathyroidism of renal origin: Secondary | ICD-10-CM | POA: Diagnosis not present

## 2021-07-29 DIAGNOSIS — Z992 Dependence on renal dialysis: Secondary | ICD-10-CM | POA: Diagnosis not present

## 2021-07-30 DIAGNOSIS — Z992 Dependence on renal dialysis: Secondary | ICD-10-CM | POA: Diagnosis not present

## 2021-07-30 DIAGNOSIS — N186 End stage renal disease: Secondary | ICD-10-CM | POA: Diagnosis not present

## 2021-07-30 DIAGNOSIS — D631 Anemia in chronic kidney disease: Secondary | ICD-10-CM | POA: Diagnosis not present

## 2021-07-30 DIAGNOSIS — N2581 Secondary hyperparathyroidism of renal origin: Secondary | ICD-10-CM | POA: Diagnosis not present

## 2021-07-31 DIAGNOSIS — N2581 Secondary hyperparathyroidism of renal origin: Secondary | ICD-10-CM | POA: Diagnosis not present

## 2021-07-31 DIAGNOSIS — N186 End stage renal disease: Secondary | ICD-10-CM | POA: Diagnosis not present

## 2021-07-31 DIAGNOSIS — Z992 Dependence on renal dialysis: Secondary | ICD-10-CM | POA: Diagnosis not present

## 2021-07-31 DIAGNOSIS — D631 Anemia in chronic kidney disease: Secondary | ICD-10-CM | POA: Diagnosis not present

## 2021-08-01 DIAGNOSIS — N2581 Secondary hyperparathyroidism of renal origin: Secondary | ICD-10-CM | POA: Diagnosis not present

## 2021-08-01 DIAGNOSIS — N186 End stage renal disease: Secondary | ICD-10-CM | POA: Diagnosis not present

## 2021-08-01 DIAGNOSIS — D631 Anemia in chronic kidney disease: Secondary | ICD-10-CM | POA: Diagnosis not present

## 2021-08-01 DIAGNOSIS — Z992 Dependence on renal dialysis: Secondary | ICD-10-CM | POA: Diagnosis not present

## 2021-08-02 DIAGNOSIS — N2581 Secondary hyperparathyroidism of renal origin: Secondary | ICD-10-CM | POA: Diagnosis not present

## 2021-08-02 DIAGNOSIS — D631 Anemia in chronic kidney disease: Secondary | ICD-10-CM | POA: Diagnosis not present

## 2021-08-02 DIAGNOSIS — Z992 Dependence on renal dialysis: Secondary | ICD-10-CM | POA: Diagnosis not present

## 2021-08-02 DIAGNOSIS — N186 End stage renal disease: Secondary | ICD-10-CM | POA: Diagnosis not present

## 2021-08-03 DIAGNOSIS — N186 End stage renal disease: Secondary | ICD-10-CM | POA: Diagnosis not present

## 2021-08-03 DIAGNOSIS — N2581 Secondary hyperparathyroidism of renal origin: Secondary | ICD-10-CM | POA: Diagnosis not present

## 2021-08-03 DIAGNOSIS — D631 Anemia in chronic kidney disease: Secondary | ICD-10-CM | POA: Diagnosis not present

## 2021-08-03 DIAGNOSIS — Z992 Dependence on renal dialysis: Secondary | ICD-10-CM | POA: Diagnosis not present

## 2021-08-04 DIAGNOSIS — Z992 Dependence on renal dialysis: Secondary | ICD-10-CM | POA: Diagnosis not present

## 2021-08-04 DIAGNOSIS — D631 Anemia in chronic kidney disease: Secondary | ICD-10-CM | POA: Diagnosis not present

## 2021-08-04 DIAGNOSIS — N186 End stage renal disease: Secondary | ICD-10-CM | POA: Diagnosis not present

## 2021-08-04 DIAGNOSIS — N2581 Secondary hyperparathyroidism of renal origin: Secondary | ICD-10-CM | POA: Diagnosis not present

## 2021-08-05 DIAGNOSIS — D631 Anemia in chronic kidney disease: Secondary | ICD-10-CM | POA: Diagnosis not present

## 2021-08-05 DIAGNOSIS — Z992 Dependence on renal dialysis: Secondary | ICD-10-CM | POA: Diagnosis not present

## 2021-08-05 DIAGNOSIS — N2581 Secondary hyperparathyroidism of renal origin: Secondary | ICD-10-CM | POA: Diagnosis not present

## 2021-08-05 DIAGNOSIS — N186 End stage renal disease: Secondary | ICD-10-CM | POA: Diagnosis not present

## 2021-08-06 DIAGNOSIS — Z9621 Cochlear implant status: Secondary | ICD-10-CM | POA: Diagnosis not present

## 2021-08-06 DIAGNOSIS — N2581 Secondary hyperparathyroidism of renal origin: Secondary | ICD-10-CM | POA: Diagnosis not present

## 2021-08-06 DIAGNOSIS — N186 End stage renal disease: Secondary | ICD-10-CM | POA: Diagnosis not present

## 2021-08-06 DIAGNOSIS — H6042 Cholesteatoma of left external ear: Secondary | ICD-10-CM | POA: Diagnosis not present

## 2021-08-06 DIAGNOSIS — H903 Sensorineural hearing loss, bilateral: Secondary | ICD-10-CM | POA: Diagnosis not present

## 2021-08-06 DIAGNOSIS — Z9889 Other specified postprocedural states: Secondary | ICD-10-CM | POA: Diagnosis not present

## 2021-08-06 DIAGNOSIS — D631 Anemia in chronic kidney disease: Secondary | ICD-10-CM | POA: Diagnosis not present

## 2021-08-06 DIAGNOSIS — H6122 Impacted cerumen, left ear: Secondary | ICD-10-CM | POA: Diagnosis not present

## 2021-08-06 DIAGNOSIS — Z992 Dependence on renal dialysis: Secondary | ICD-10-CM | POA: Diagnosis not present

## 2021-08-07 DIAGNOSIS — N2581 Secondary hyperparathyroidism of renal origin: Secondary | ICD-10-CM | POA: Diagnosis not present

## 2021-08-07 DIAGNOSIS — D631 Anemia in chronic kidney disease: Secondary | ICD-10-CM | POA: Diagnosis not present

## 2021-08-07 DIAGNOSIS — Z992 Dependence on renal dialysis: Secondary | ICD-10-CM | POA: Diagnosis not present

## 2021-08-07 DIAGNOSIS — N186 End stage renal disease: Secondary | ICD-10-CM | POA: Diagnosis not present

## 2021-08-08 DIAGNOSIS — Z992 Dependence on renal dialysis: Secondary | ICD-10-CM | POA: Diagnosis not present

## 2021-08-08 DIAGNOSIS — N186 End stage renal disease: Secondary | ICD-10-CM | POA: Diagnosis not present

## 2021-08-08 DIAGNOSIS — N2581 Secondary hyperparathyroidism of renal origin: Secondary | ICD-10-CM | POA: Diagnosis not present

## 2021-08-08 DIAGNOSIS — D631 Anemia in chronic kidney disease: Secondary | ICD-10-CM | POA: Diagnosis not present

## 2021-08-09 DIAGNOSIS — D631 Anemia in chronic kidney disease: Secondary | ICD-10-CM | POA: Diagnosis not present

## 2021-08-09 DIAGNOSIS — N186 End stage renal disease: Secondary | ICD-10-CM | POA: Diagnosis not present

## 2021-08-09 DIAGNOSIS — Z992 Dependence on renal dialysis: Secondary | ICD-10-CM | POA: Diagnosis not present

## 2021-08-09 DIAGNOSIS — N2581 Secondary hyperparathyroidism of renal origin: Secondary | ICD-10-CM | POA: Diagnosis not present

## 2021-08-10 DIAGNOSIS — Z992 Dependence on renal dialysis: Secondary | ICD-10-CM | POA: Diagnosis not present

## 2021-08-10 DIAGNOSIS — N186 End stage renal disease: Secondary | ICD-10-CM | POA: Diagnosis not present

## 2021-08-10 DIAGNOSIS — D631 Anemia in chronic kidney disease: Secondary | ICD-10-CM | POA: Diagnosis not present

## 2021-08-10 DIAGNOSIS — N2581 Secondary hyperparathyroidism of renal origin: Secondary | ICD-10-CM | POA: Diagnosis not present

## 2021-08-11 DIAGNOSIS — N186 End stage renal disease: Secondary | ICD-10-CM | POA: Diagnosis not present

## 2021-08-11 DIAGNOSIS — Z992 Dependence on renal dialysis: Secondary | ICD-10-CM | POA: Diagnosis not present

## 2021-08-11 DIAGNOSIS — N2581 Secondary hyperparathyroidism of renal origin: Secondary | ICD-10-CM | POA: Diagnosis not present

## 2021-08-11 DIAGNOSIS — D631 Anemia in chronic kidney disease: Secondary | ICD-10-CM | POA: Diagnosis not present

## 2021-08-12 DIAGNOSIS — N2581 Secondary hyperparathyroidism of renal origin: Secondary | ICD-10-CM | POA: Diagnosis not present

## 2021-08-12 DIAGNOSIS — D631 Anemia in chronic kidney disease: Secondary | ICD-10-CM | POA: Diagnosis not present

## 2021-08-12 DIAGNOSIS — N186 End stage renal disease: Secondary | ICD-10-CM | POA: Diagnosis not present

## 2021-08-12 DIAGNOSIS — Z992 Dependence on renal dialysis: Secondary | ICD-10-CM | POA: Diagnosis not present

## 2021-08-13 DIAGNOSIS — N186 End stage renal disease: Secondary | ICD-10-CM | POA: Diagnosis not present

## 2021-08-13 DIAGNOSIS — N2581 Secondary hyperparathyroidism of renal origin: Secondary | ICD-10-CM | POA: Diagnosis not present

## 2021-08-13 DIAGNOSIS — Z992 Dependence on renal dialysis: Secondary | ICD-10-CM | POA: Diagnosis not present

## 2021-08-13 DIAGNOSIS — D631 Anemia in chronic kidney disease: Secondary | ICD-10-CM | POA: Diagnosis not present

## 2021-08-14 DIAGNOSIS — N186 End stage renal disease: Secondary | ICD-10-CM | POA: Diagnosis not present

## 2021-08-14 DIAGNOSIS — Z992 Dependence on renal dialysis: Secondary | ICD-10-CM | POA: Diagnosis not present

## 2021-08-14 DIAGNOSIS — D631 Anemia in chronic kidney disease: Secondary | ICD-10-CM | POA: Diagnosis not present

## 2021-08-14 DIAGNOSIS — N2581 Secondary hyperparathyroidism of renal origin: Secondary | ICD-10-CM | POA: Diagnosis not present

## 2021-08-15 DIAGNOSIS — N2581 Secondary hyperparathyroidism of renal origin: Secondary | ICD-10-CM | POA: Diagnosis not present

## 2021-08-15 DIAGNOSIS — Z992 Dependence on renal dialysis: Secondary | ICD-10-CM | POA: Diagnosis not present

## 2021-08-15 DIAGNOSIS — D631 Anemia in chronic kidney disease: Secondary | ICD-10-CM | POA: Diagnosis not present

## 2021-08-15 DIAGNOSIS — N186 End stage renal disease: Secondary | ICD-10-CM | POA: Diagnosis not present

## 2021-08-16 DIAGNOSIS — Z992 Dependence on renal dialysis: Secondary | ICD-10-CM | POA: Diagnosis not present

## 2021-08-16 DIAGNOSIS — N2581 Secondary hyperparathyroidism of renal origin: Secondary | ICD-10-CM | POA: Diagnosis not present

## 2021-08-16 DIAGNOSIS — D631 Anemia in chronic kidney disease: Secondary | ICD-10-CM | POA: Diagnosis not present

## 2021-08-16 DIAGNOSIS — N186 End stage renal disease: Secondary | ICD-10-CM | POA: Diagnosis not present

## 2021-08-17 ENCOUNTER — Telehealth: Payer: Self-pay | Admitting: Cardiovascular Disease

## 2021-08-17 DIAGNOSIS — D631 Anemia in chronic kidney disease: Secondary | ICD-10-CM | POA: Diagnosis not present

## 2021-08-17 DIAGNOSIS — Z992 Dependence on renal dialysis: Secondary | ICD-10-CM | POA: Diagnosis not present

## 2021-08-17 DIAGNOSIS — N2581 Secondary hyperparathyroidism of renal origin: Secondary | ICD-10-CM | POA: Diagnosis not present

## 2021-08-17 DIAGNOSIS — N186 End stage renal disease: Secondary | ICD-10-CM | POA: Diagnosis not present

## 2021-08-17 MED ORDER — APIXABAN 2.5 MG PO TABS: ORAL_TABLET | ORAL | 1 refills | Status: AC

## 2021-08-17 NOTE — Telephone Encounter (Signed)
Prescription refill request for Eliquis received. Indication:atrial fib Last office visit:6/22 Scr:7.9 Age: 87 Weight:86.6 kg  Prescription refilled

## 2021-08-17 NOTE — Telephone Encounter (Signed)
*  STAT* If patient is at the pharmacy, call can be transferred to refill team.   1. Which medications need to be refilled? (please list name of each medication and dose if known) Eliquis   2. Which pharmacy/location (including street and city if local pharmacy) is medication to be sent to? [facility] 3. Do they need a 30 day or 90 day supply? 30     Patient is out

## 2021-08-18 DIAGNOSIS — D631 Anemia in chronic kidney disease: Secondary | ICD-10-CM | POA: Diagnosis not present

## 2021-08-18 DIAGNOSIS — N186 End stage renal disease: Secondary | ICD-10-CM | POA: Diagnosis not present

## 2021-08-18 DIAGNOSIS — N2581 Secondary hyperparathyroidism of renal origin: Secondary | ICD-10-CM | POA: Diagnosis not present

## 2021-08-18 DIAGNOSIS — Z992 Dependence on renal dialysis: Secondary | ICD-10-CM | POA: Diagnosis not present

## 2021-08-19 DIAGNOSIS — I129 Hypertensive chronic kidney disease with stage 1 through stage 4 chronic kidney disease, or unspecified chronic kidney disease: Secondary | ICD-10-CM | POA: Diagnosis not present

## 2021-08-19 DIAGNOSIS — N186 End stage renal disease: Secondary | ICD-10-CM | POA: Diagnosis not present

## 2021-08-19 DIAGNOSIS — D631 Anemia in chronic kidney disease: Secondary | ICD-10-CM | POA: Diagnosis not present

## 2021-08-19 DIAGNOSIS — Z992 Dependence on renal dialysis: Secondary | ICD-10-CM | POA: Diagnosis not present

## 2021-08-19 DIAGNOSIS — N2581 Secondary hyperparathyroidism of renal origin: Secondary | ICD-10-CM | POA: Diagnosis not present

## 2021-08-20 DIAGNOSIS — N2581 Secondary hyperparathyroidism of renal origin: Secondary | ICD-10-CM | POA: Diagnosis not present

## 2021-08-20 DIAGNOSIS — E440 Moderate protein-calorie malnutrition: Secondary | ICD-10-CM | POA: Diagnosis not present

## 2021-08-20 DIAGNOSIS — K769 Liver disease, unspecified: Secondary | ICD-10-CM | POA: Diagnosis not present

## 2021-08-20 DIAGNOSIS — Z992 Dependence on renal dialysis: Secondary | ICD-10-CM | POA: Diagnosis not present

## 2021-08-20 DIAGNOSIS — N186 End stage renal disease: Secondary | ICD-10-CM | POA: Diagnosis not present

## 2021-08-20 DIAGNOSIS — D509 Iron deficiency anemia, unspecified: Secondary | ICD-10-CM | POA: Diagnosis not present

## 2021-08-20 DIAGNOSIS — Z4932 Encounter for adequacy testing for peritoneal dialysis: Secondary | ICD-10-CM | POA: Diagnosis not present

## 2021-08-20 DIAGNOSIS — R82998 Other abnormal findings in urine: Secondary | ICD-10-CM | POA: Diagnosis not present

## 2021-08-20 DIAGNOSIS — Z79899 Other long term (current) drug therapy: Secondary | ICD-10-CM | POA: Diagnosis not present

## 2021-08-20 DIAGNOSIS — D631 Anemia in chronic kidney disease: Secondary | ICD-10-CM | POA: Diagnosis not present

## 2021-08-21 ENCOUNTER — Ambulatory Visit: Payer: Medicare Other | Admitting: Internal Medicine

## 2021-08-21 DIAGNOSIS — D509 Iron deficiency anemia, unspecified: Secondary | ICD-10-CM | POA: Diagnosis not present

## 2021-08-21 DIAGNOSIS — E440 Moderate protein-calorie malnutrition: Secondary | ICD-10-CM | POA: Diagnosis not present

## 2021-08-21 DIAGNOSIS — R82998 Other abnormal findings in urine: Secondary | ICD-10-CM | POA: Diagnosis not present

## 2021-08-21 DIAGNOSIS — Z79899 Other long term (current) drug therapy: Secondary | ICD-10-CM | POA: Diagnosis not present

## 2021-08-21 DIAGNOSIS — D631 Anemia in chronic kidney disease: Secondary | ICD-10-CM | POA: Diagnosis not present

## 2021-08-21 DIAGNOSIS — K769 Liver disease, unspecified: Secondary | ICD-10-CM | POA: Diagnosis not present

## 2021-08-21 DIAGNOSIS — N186 End stage renal disease: Secondary | ICD-10-CM | POA: Diagnosis not present

## 2021-08-21 DIAGNOSIS — Z4932 Encounter for adequacy testing for peritoneal dialysis: Secondary | ICD-10-CM | POA: Diagnosis not present

## 2021-08-21 DIAGNOSIS — Z992 Dependence on renal dialysis: Secondary | ICD-10-CM | POA: Diagnosis not present

## 2021-08-21 DIAGNOSIS — N2581 Secondary hyperparathyroidism of renal origin: Secondary | ICD-10-CM | POA: Diagnosis not present

## 2021-08-22 DIAGNOSIS — E440 Moderate protein-calorie malnutrition: Secondary | ICD-10-CM | POA: Diagnosis not present

## 2021-08-22 DIAGNOSIS — K769 Liver disease, unspecified: Secondary | ICD-10-CM | POA: Diagnosis not present

## 2021-08-22 DIAGNOSIS — Z992 Dependence on renal dialysis: Secondary | ICD-10-CM | POA: Diagnosis not present

## 2021-08-22 DIAGNOSIS — D509 Iron deficiency anemia, unspecified: Secondary | ICD-10-CM | POA: Diagnosis not present

## 2021-08-22 DIAGNOSIS — N186 End stage renal disease: Secondary | ICD-10-CM | POA: Diagnosis not present

## 2021-08-22 DIAGNOSIS — R82998 Other abnormal findings in urine: Secondary | ICD-10-CM | POA: Diagnosis not present

## 2021-08-22 DIAGNOSIS — D631 Anemia in chronic kidney disease: Secondary | ICD-10-CM | POA: Diagnosis not present

## 2021-08-22 DIAGNOSIS — Z79899 Other long term (current) drug therapy: Secondary | ICD-10-CM | POA: Diagnosis not present

## 2021-08-22 DIAGNOSIS — Z4932 Encounter for adequacy testing for peritoneal dialysis: Secondary | ICD-10-CM | POA: Diagnosis not present

## 2021-08-22 DIAGNOSIS — N2581 Secondary hyperparathyroidism of renal origin: Secondary | ICD-10-CM | POA: Diagnosis not present

## 2021-08-23 DIAGNOSIS — E440 Moderate protein-calorie malnutrition: Secondary | ICD-10-CM | POA: Diagnosis not present

## 2021-08-23 DIAGNOSIS — D631 Anemia in chronic kidney disease: Secondary | ICD-10-CM | POA: Diagnosis not present

## 2021-08-23 DIAGNOSIS — Z4932 Encounter for adequacy testing for peritoneal dialysis: Secondary | ICD-10-CM | POA: Diagnosis not present

## 2021-08-23 DIAGNOSIS — Z992 Dependence on renal dialysis: Secondary | ICD-10-CM | POA: Diagnosis not present

## 2021-08-23 DIAGNOSIS — D509 Iron deficiency anemia, unspecified: Secondary | ICD-10-CM | POA: Diagnosis not present

## 2021-08-23 DIAGNOSIS — N2581 Secondary hyperparathyroidism of renal origin: Secondary | ICD-10-CM | POA: Diagnosis not present

## 2021-08-23 DIAGNOSIS — Z79899 Other long term (current) drug therapy: Secondary | ICD-10-CM | POA: Diagnosis not present

## 2021-08-23 DIAGNOSIS — K769 Liver disease, unspecified: Secondary | ICD-10-CM | POA: Diagnosis not present

## 2021-08-23 DIAGNOSIS — R82998 Other abnormal findings in urine: Secondary | ICD-10-CM | POA: Diagnosis not present

## 2021-08-23 DIAGNOSIS — N186 End stage renal disease: Secondary | ICD-10-CM | POA: Diagnosis not present

## 2021-08-24 DIAGNOSIS — N2581 Secondary hyperparathyroidism of renal origin: Secondary | ICD-10-CM | POA: Diagnosis not present

## 2021-08-24 DIAGNOSIS — D631 Anemia in chronic kidney disease: Secondary | ICD-10-CM | POA: Diagnosis not present

## 2021-08-24 DIAGNOSIS — R82998 Other abnormal findings in urine: Secondary | ICD-10-CM | POA: Diagnosis not present

## 2021-08-24 DIAGNOSIS — D509 Iron deficiency anemia, unspecified: Secondary | ICD-10-CM | POA: Diagnosis not present

## 2021-08-24 DIAGNOSIS — N186 End stage renal disease: Secondary | ICD-10-CM | POA: Diagnosis not present

## 2021-08-24 DIAGNOSIS — Z4932 Encounter for adequacy testing for peritoneal dialysis: Secondary | ICD-10-CM | POA: Diagnosis not present

## 2021-08-24 DIAGNOSIS — E440 Moderate protein-calorie malnutrition: Secondary | ICD-10-CM | POA: Diagnosis not present

## 2021-08-24 DIAGNOSIS — Z992 Dependence on renal dialysis: Secondary | ICD-10-CM | POA: Diagnosis not present

## 2021-08-24 DIAGNOSIS — Z79899 Other long term (current) drug therapy: Secondary | ICD-10-CM | POA: Diagnosis not present

## 2021-08-24 DIAGNOSIS — K769 Liver disease, unspecified: Secondary | ICD-10-CM | POA: Diagnosis not present

## 2021-08-25 DIAGNOSIS — R82998 Other abnormal findings in urine: Secondary | ICD-10-CM | POA: Diagnosis not present

## 2021-08-25 DIAGNOSIS — D509 Iron deficiency anemia, unspecified: Secondary | ICD-10-CM | POA: Diagnosis not present

## 2021-08-25 DIAGNOSIS — N2581 Secondary hyperparathyroidism of renal origin: Secondary | ICD-10-CM | POA: Diagnosis not present

## 2021-08-25 DIAGNOSIS — Z79899 Other long term (current) drug therapy: Secondary | ICD-10-CM | POA: Diagnosis not present

## 2021-08-25 DIAGNOSIS — Z992 Dependence on renal dialysis: Secondary | ICD-10-CM | POA: Diagnosis not present

## 2021-08-25 DIAGNOSIS — N186 End stage renal disease: Secondary | ICD-10-CM | POA: Diagnosis not present

## 2021-08-25 DIAGNOSIS — K769 Liver disease, unspecified: Secondary | ICD-10-CM | POA: Diagnosis not present

## 2021-08-25 DIAGNOSIS — E440 Moderate protein-calorie malnutrition: Secondary | ICD-10-CM | POA: Diagnosis not present

## 2021-08-25 DIAGNOSIS — D631 Anemia in chronic kidney disease: Secondary | ICD-10-CM | POA: Diagnosis not present

## 2021-08-25 DIAGNOSIS — Z4932 Encounter for adequacy testing for peritoneal dialysis: Secondary | ICD-10-CM | POA: Diagnosis not present

## 2021-08-26 ENCOUNTER — Ambulatory Visit: Payer: Medicare Other | Admitting: Internal Medicine

## 2021-08-26 DIAGNOSIS — D509 Iron deficiency anemia, unspecified: Secondary | ICD-10-CM | POA: Diagnosis not present

## 2021-08-26 DIAGNOSIS — N2581 Secondary hyperparathyroidism of renal origin: Secondary | ICD-10-CM | POA: Diagnosis not present

## 2021-08-26 DIAGNOSIS — N186 End stage renal disease: Secondary | ICD-10-CM | POA: Diagnosis not present

## 2021-08-26 DIAGNOSIS — Z992 Dependence on renal dialysis: Secondary | ICD-10-CM | POA: Diagnosis not present

## 2021-08-26 DIAGNOSIS — D631 Anemia in chronic kidney disease: Secondary | ICD-10-CM | POA: Diagnosis not present

## 2021-08-26 DIAGNOSIS — Z4932 Encounter for adequacy testing for peritoneal dialysis: Secondary | ICD-10-CM | POA: Diagnosis not present

## 2021-08-26 DIAGNOSIS — Z79899 Other long term (current) drug therapy: Secondary | ICD-10-CM | POA: Diagnosis not present

## 2021-08-26 DIAGNOSIS — E440 Moderate protein-calorie malnutrition: Secondary | ICD-10-CM | POA: Diagnosis not present

## 2021-08-26 DIAGNOSIS — K769 Liver disease, unspecified: Secondary | ICD-10-CM | POA: Diagnosis not present

## 2021-08-26 DIAGNOSIS — R82998 Other abnormal findings in urine: Secondary | ICD-10-CM | POA: Diagnosis not present

## 2021-08-27 DIAGNOSIS — D631 Anemia in chronic kidney disease: Secondary | ICD-10-CM | POA: Diagnosis not present

## 2021-08-27 DIAGNOSIS — Z79899 Other long term (current) drug therapy: Secondary | ICD-10-CM | POA: Diagnosis not present

## 2021-08-27 DIAGNOSIS — K769 Liver disease, unspecified: Secondary | ICD-10-CM | POA: Diagnosis not present

## 2021-08-27 DIAGNOSIS — N186 End stage renal disease: Secondary | ICD-10-CM | POA: Diagnosis not present

## 2021-08-27 DIAGNOSIS — Z4932 Encounter for adequacy testing for peritoneal dialysis: Secondary | ICD-10-CM | POA: Diagnosis not present

## 2021-08-27 DIAGNOSIS — D509 Iron deficiency anemia, unspecified: Secondary | ICD-10-CM | POA: Diagnosis not present

## 2021-08-27 DIAGNOSIS — R82998 Other abnormal findings in urine: Secondary | ICD-10-CM | POA: Diagnosis not present

## 2021-08-27 DIAGNOSIS — N2581 Secondary hyperparathyroidism of renal origin: Secondary | ICD-10-CM | POA: Diagnosis not present

## 2021-08-27 DIAGNOSIS — E440 Moderate protein-calorie malnutrition: Secondary | ICD-10-CM | POA: Diagnosis not present

## 2021-08-27 DIAGNOSIS — Z992 Dependence on renal dialysis: Secondary | ICD-10-CM | POA: Diagnosis not present

## 2021-08-28 DIAGNOSIS — Z992 Dependence on renal dialysis: Secondary | ICD-10-CM | POA: Diagnosis not present

## 2021-08-28 DIAGNOSIS — Z79899 Other long term (current) drug therapy: Secondary | ICD-10-CM | POA: Diagnosis not present

## 2021-08-28 DIAGNOSIS — D631 Anemia in chronic kidney disease: Secondary | ICD-10-CM | POA: Diagnosis not present

## 2021-08-28 DIAGNOSIS — N186 End stage renal disease: Secondary | ICD-10-CM | POA: Diagnosis not present

## 2021-08-28 DIAGNOSIS — R82998 Other abnormal findings in urine: Secondary | ICD-10-CM | POA: Diagnosis not present

## 2021-08-28 DIAGNOSIS — K769 Liver disease, unspecified: Secondary | ICD-10-CM | POA: Diagnosis not present

## 2021-08-28 DIAGNOSIS — E440 Moderate protein-calorie malnutrition: Secondary | ICD-10-CM | POA: Diagnosis not present

## 2021-08-28 DIAGNOSIS — N2581 Secondary hyperparathyroidism of renal origin: Secondary | ICD-10-CM | POA: Diagnosis not present

## 2021-08-28 DIAGNOSIS — D509 Iron deficiency anemia, unspecified: Secondary | ICD-10-CM | POA: Diagnosis not present

## 2021-08-28 DIAGNOSIS — Z4932 Encounter for adequacy testing for peritoneal dialysis: Secondary | ICD-10-CM | POA: Diagnosis not present

## 2021-08-29 DIAGNOSIS — Z4932 Encounter for adequacy testing for peritoneal dialysis: Secondary | ICD-10-CM | POA: Diagnosis not present

## 2021-08-29 DIAGNOSIS — R82998 Other abnormal findings in urine: Secondary | ICD-10-CM | POA: Diagnosis not present

## 2021-08-29 DIAGNOSIS — Z992 Dependence on renal dialysis: Secondary | ICD-10-CM | POA: Diagnosis not present

## 2021-08-29 DIAGNOSIS — N186 End stage renal disease: Secondary | ICD-10-CM | POA: Diagnosis not present

## 2021-08-29 DIAGNOSIS — Z79899 Other long term (current) drug therapy: Secondary | ICD-10-CM | POA: Diagnosis not present

## 2021-08-29 DIAGNOSIS — E440 Moderate protein-calorie malnutrition: Secondary | ICD-10-CM | POA: Diagnosis not present

## 2021-08-29 DIAGNOSIS — D631 Anemia in chronic kidney disease: Secondary | ICD-10-CM | POA: Diagnosis not present

## 2021-08-29 DIAGNOSIS — N2581 Secondary hyperparathyroidism of renal origin: Secondary | ICD-10-CM | POA: Diagnosis not present

## 2021-08-29 DIAGNOSIS — K769 Liver disease, unspecified: Secondary | ICD-10-CM | POA: Diagnosis not present

## 2021-08-29 DIAGNOSIS — D509 Iron deficiency anemia, unspecified: Secondary | ICD-10-CM | POA: Diagnosis not present

## 2021-08-30 DIAGNOSIS — N186 End stage renal disease: Secondary | ICD-10-CM | POA: Diagnosis not present

## 2021-08-30 DIAGNOSIS — Z4932 Encounter for adequacy testing for peritoneal dialysis: Secondary | ICD-10-CM | POA: Diagnosis not present

## 2021-08-30 DIAGNOSIS — K769 Liver disease, unspecified: Secondary | ICD-10-CM | POA: Diagnosis not present

## 2021-08-30 DIAGNOSIS — Z79899 Other long term (current) drug therapy: Secondary | ICD-10-CM | POA: Diagnosis not present

## 2021-08-30 DIAGNOSIS — Z992 Dependence on renal dialysis: Secondary | ICD-10-CM | POA: Diagnosis not present

## 2021-08-30 DIAGNOSIS — N2581 Secondary hyperparathyroidism of renal origin: Secondary | ICD-10-CM | POA: Diagnosis not present

## 2021-08-30 DIAGNOSIS — R82998 Other abnormal findings in urine: Secondary | ICD-10-CM | POA: Diagnosis not present

## 2021-08-30 DIAGNOSIS — D509 Iron deficiency anemia, unspecified: Secondary | ICD-10-CM | POA: Diagnosis not present

## 2021-08-30 DIAGNOSIS — D631 Anemia in chronic kidney disease: Secondary | ICD-10-CM | POA: Diagnosis not present

## 2021-08-30 DIAGNOSIS — E440 Moderate protein-calorie malnutrition: Secondary | ICD-10-CM | POA: Diagnosis not present

## 2021-08-31 DIAGNOSIS — K769 Liver disease, unspecified: Secondary | ICD-10-CM | POA: Diagnosis not present

## 2021-08-31 DIAGNOSIS — D631 Anemia in chronic kidney disease: Secondary | ICD-10-CM | POA: Diagnosis not present

## 2021-08-31 DIAGNOSIS — R82998 Other abnormal findings in urine: Secondary | ICD-10-CM | POA: Diagnosis not present

## 2021-08-31 DIAGNOSIS — Z79899 Other long term (current) drug therapy: Secondary | ICD-10-CM | POA: Diagnosis not present

## 2021-08-31 DIAGNOSIS — N2581 Secondary hyperparathyroidism of renal origin: Secondary | ICD-10-CM | POA: Diagnosis not present

## 2021-08-31 DIAGNOSIS — Z992 Dependence on renal dialysis: Secondary | ICD-10-CM | POA: Diagnosis not present

## 2021-08-31 DIAGNOSIS — D509 Iron deficiency anemia, unspecified: Secondary | ICD-10-CM | POA: Diagnosis not present

## 2021-08-31 DIAGNOSIS — N186 End stage renal disease: Secondary | ICD-10-CM | POA: Diagnosis not present

## 2021-08-31 DIAGNOSIS — Z4932 Encounter for adequacy testing for peritoneal dialysis: Secondary | ICD-10-CM | POA: Diagnosis not present

## 2021-08-31 DIAGNOSIS — E440 Moderate protein-calorie malnutrition: Secondary | ICD-10-CM | POA: Diagnosis not present

## 2021-09-01 DIAGNOSIS — Z992 Dependence on renal dialysis: Secondary | ICD-10-CM | POA: Diagnosis not present

## 2021-09-01 DIAGNOSIS — N2581 Secondary hyperparathyroidism of renal origin: Secondary | ICD-10-CM | POA: Diagnosis not present

## 2021-09-01 DIAGNOSIS — N186 End stage renal disease: Secondary | ICD-10-CM | POA: Diagnosis not present

## 2021-09-01 DIAGNOSIS — Z79899 Other long term (current) drug therapy: Secondary | ICD-10-CM | POA: Diagnosis not present

## 2021-09-01 DIAGNOSIS — E440 Moderate protein-calorie malnutrition: Secondary | ICD-10-CM | POA: Diagnosis not present

## 2021-09-01 DIAGNOSIS — R82998 Other abnormal findings in urine: Secondary | ICD-10-CM | POA: Diagnosis not present

## 2021-09-01 DIAGNOSIS — Z4932 Encounter for adequacy testing for peritoneal dialysis: Secondary | ICD-10-CM | POA: Diagnosis not present

## 2021-09-01 DIAGNOSIS — K769 Liver disease, unspecified: Secondary | ICD-10-CM | POA: Diagnosis not present

## 2021-09-01 DIAGNOSIS — D631 Anemia in chronic kidney disease: Secondary | ICD-10-CM | POA: Diagnosis not present

## 2021-09-01 DIAGNOSIS — D509 Iron deficiency anemia, unspecified: Secondary | ICD-10-CM | POA: Diagnosis not present

## 2021-09-02 DIAGNOSIS — K769 Liver disease, unspecified: Secondary | ICD-10-CM | POA: Diagnosis not present

## 2021-09-02 DIAGNOSIS — D631 Anemia in chronic kidney disease: Secondary | ICD-10-CM | POA: Diagnosis not present

## 2021-09-02 DIAGNOSIS — R82998 Other abnormal findings in urine: Secondary | ICD-10-CM | POA: Diagnosis not present

## 2021-09-02 DIAGNOSIS — Z4932 Encounter for adequacy testing for peritoneal dialysis: Secondary | ICD-10-CM | POA: Diagnosis not present

## 2021-09-02 DIAGNOSIS — N2581 Secondary hyperparathyroidism of renal origin: Secondary | ICD-10-CM | POA: Diagnosis not present

## 2021-09-02 DIAGNOSIS — Z79899 Other long term (current) drug therapy: Secondary | ICD-10-CM | POA: Diagnosis not present

## 2021-09-02 DIAGNOSIS — E440 Moderate protein-calorie malnutrition: Secondary | ICD-10-CM | POA: Diagnosis not present

## 2021-09-02 DIAGNOSIS — D509 Iron deficiency anemia, unspecified: Secondary | ICD-10-CM | POA: Diagnosis not present

## 2021-09-02 DIAGNOSIS — N186 End stage renal disease: Secondary | ICD-10-CM | POA: Diagnosis not present

## 2021-09-02 DIAGNOSIS — Z992 Dependence on renal dialysis: Secondary | ICD-10-CM | POA: Diagnosis not present

## 2021-09-03 DIAGNOSIS — D631 Anemia in chronic kidney disease: Secondary | ICD-10-CM | POA: Diagnosis not present

## 2021-09-03 DIAGNOSIS — N2581 Secondary hyperparathyroidism of renal origin: Secondary | ICD-10-CM | POA: Diagnosis not present

## 2021-09-03 DIAGNOSIS — Z4932 Encounter for adequacy testing for peritoneal dialysis: Secondary | ICD-10-CM | POA: Diagnosis not present

## 2021-09-03 DIAGNOSIS — R82998 Other abnormal findings in urine: Secondary | ICD-10-CM | POA: Diagnosis not present

## 2021-09-03 DIAGNOSIS — Z992 Dependence on renal dialysis: Secondary | ICD-10-CM | POA: Diagnosis not present

## 2021-09-03 DIAGNOSIS — Z79899 Other long term (current) drug therapy: Secondary | ICD-10-CM | POA: Diagnosis not present

## 2021-09-03 DIAGNOSIS — N186 End stage renal disease: Secondary | ICD-10-CM | POA: Diagnosis not present

## 2021-09-03 DIAGNOSIS — D509 Iron deficiency anemia, unspecified: Secondary | ICD-10-CM | POA: Diagnosis not present

## 2021-09-03 DIAGNOSIS — E440 Moderate protein-calorie malnutrition: Secondary | ICD-10-CM | POA: Diagnosis not present

## 2021-09-03 DIAGNOSIS — K769 Liver disease, unspecified: Secondary | ICD-10-CM | POA: Diagnosis not present

## 2021-09-04 DIAGNOSIS — N2581 Secondary hyperparathyroidism of renal origin: Secondary | ICD-10-CM | POA: Diagnosis not present

## 2021-09-04 DIAGNOSIS — E440 Moderate protein-calorie malnutrition: Secondary | ICD-10-CM | POA: Diagnosis not present

## 2021-09-04 DIAGNOSIS — D631 Anemia in chronic kidney disease: Secondary | ICD-10-CM | POA: Diagnosis not present

## 2021-09-04 DIAGNOSIS — K769 Liver disease, unspecified: Secondary | ICD-10-CM | POA: Diagnosis not present

## 2021-09-04 DIAGNOSIS — Z992 Dependence on renal dialysis: Secondary | ICD-10-CM | POA: Diagnosis not present

## 2021-09-04 DIAGNOSIS — D509 Iron deficiency anemia, unspecified: Secondary | ICD-10-CM | POA: Diagnosis not present

## 2021-09-04 DIAGNOSIS — Z4932 Encounter for adequacy testing for peritoneal dialysis: Secondary | ICD-10-CM | POA: Diagnosis not present

## 2021-09-04 DIAGNOSIS — N186 End stage renal disease: Secondary | ICD-10-CM | POA: Diagnosis not present

## 2021-09-04 DIAGNOSIS — Z79899 Other long term (current) drug therapy: Secondary | ICD-10-CM | POA: Diagnosis not present

## 2021-09-04 DIAGNOSIS — R82998 Other abnormal findings in urine: Secondary | ICD-10-CM | POA: Diagnosis not present

## 2021-09-05 DIAGNOSIS — N186 End stage renal disease: Secondary | ICD-10-CM | POA: Diagnosis not present

## 2021-09-05 DIAGNOSIS — R82998 Other abnormal findings in urine: Secondary | ICD-10-CM | POA: Diagnosis not present

## 2021-09-05 DIAGNOSIS — N2581 Secondary hyperparathyroidism of renal origin: Secondary | ICD-10-CM | POA: Diagnosis not present

## 2021-09-05 DIAGNOSIS — K769 Liver disease, unspecified: Secondary | ICD-10-CM | POA: Diagnosis not present

## 2021-09-05 DIAGNOSIS — Z992 Dependence on renal dialysis: Secondary | ICD-10-CM | POA: Diagnosis not present

## 2021-09-05 DIAGNOSIS — D509 Iron deficiency anemia, unspecified: Secondary | ICD-10-CM | POA: Diagnosis not present

## 2021-09-05 DIAGNOSIS — D631 Anemia in chronic kidney disease: Secondary | ICD-10-CM | POA: Diagnosis not present

## 2021-09-05 DIAGNOSIS — Z79899 Other long term (current) drug therapy: Secondary | ICD-10-CM | POA: Diagnosis not present

## 2021-09-05 DIAGNOSIS — Z4932 Encounter for adequacy testing for peritoneal dialysis: Secondary | ICD-10-CM | POA: Diagnosis not present

## 2021-09-05 DIAGNOSIS — E440 Moderate protein-calorie malnutrition: Secondary | ICD-10-CM | POA: Diagnosis not present

## 2021-09-06 DIAGNOSIS — D631 Anemia in chronic kidney disease: Secondary | ICD-10-CM | POA: Diagnosis not present

## 2021-09-06 DIAGNOSIS — N2581 Secondary hyperparathyroidism of renal origin: Secondary | ICD-10-CM | POA: Diagnosis not present

## 2021-09-06 DIAGNOSIS — D509 Iron deficiency anemia, unspecified: Secondary | ICD-10-CM | POA: Diagnosis not present

## 2021-09-06 DIAGNOSIS — N186 End stage renal disease: Secondary | ICD-10-CM | POA: Diagnosis not present

## 2021-09-06 DIAGNOSIS — Z4932 Encounter for adequacy testing for peritoneal dialysis: Secondary | ICD-10-CM | POA: Diagnosis not present

## 2021-09-06 DIAGNOSIS — Z79899 Other long term (current) drug therapy: Secondary | ICD-10-CM | POA: Diagnosis not present

## 2021-09-06 DIAGNOSIS — E440 Moderate protein-calorie malnutrition: Secondary | ICD-10-CM | POA: Diagnosis not present

## 2021-09-06 DIAGNOSIS — R82998 Other abnormal findings in urine: Secondary | ICD-10-CM | POA: Diagnosis not present

## 2021-09-06 DIAGNOSIS — Z992 Dependence on renal dialysis: Secondary | ICD-10-CM | POA: Diagnosis not present

## 2021-09-06 DIAGNOSIS — K769 Liver disease, unspecified: Secondary | ICD-10-CM | POA: Diagnosis not present

## 2021-09-07 DIAGNOSIS — K769 Liver disease, unspecified: Secondary | ICD-10-CM | POA: Diagnosis not present

## 2021-09-07 DIAGNOSIS — D509 Iron deficiency anemia, unspecified: Secondary | ICD-10-CM | POA: Diagnosis not present

## 2021-09-07 DIAGNOSIS — N186 End stage renal disease: Secondary | ICD-10-CM | POA: Diagnosis not present

## 2021-09-07 DIAGNOSIS — D631 Anemia in chronic kidney disease: Secondary | ICD-10-CM | POA: Diagnosis not present

## 2021-09-07 DIAGNOSIS — E440 Moderate protein-calorie malnutrition: Secondary | ICD-10-CM | POA: Diagnosis not present

## 2021-09-07 DIAGNOSIS — Z4932 Encounter for adequacy testing for peritoneal dialysis: Secondary | ICD-10-CM | POA: Diagnosis not present

## 2021-09-07 DIAGNOSIS — Z79899 Other long term (current) drug therapy: Secondary | ICD-10-CM | POA: Diagnosis not present

## 2021-09-07 DIAGNOSIS — N2581 Secondary hyperparathyroidism of renal origin: Secondary | ICD-10-CM | POA: Diagnosis not present

## 2021-09-07 DIAGNOSIS — Z992 Dependence on renal dialysis: Secondary | ICD-10-CM | POA: Diagnosis not present

## 2021-09-07 DIAGNOSIS — R82998 Other abnormal findings in urine: Secondary | ICD-10-CM | POA: Diagnosis not present

## 2021-09-08 DIAGNOSIS — D631 Anemia in chronic kidney disease: Secondary | ICD-10-CM | POA: Diagnosis not present

## 2021-09-08 DIAGNOSIS — R82998 Other abnormal findings in urine: Secondary | ICD-10-CM | POA: Diagnosis not present

## 2021-09-08 DIAGNOSIS — E440 Moderate protein-calorie malnutrition: Secondary | ICD-10-CM | POA: Diagnosis not present

## 2021-09-08 DIAGNOSIS — D509 Iron deficiency anemia, unspecified: Secondary | ICD-10-CM | POA: Diagnosis not present

## 2021-09-08 DIAGNOSIS — Z4932 Encounter for adequacy testing for peritoneal dialysis: Secondary | ICD-10-CM | POA: Diagnosis not present

## 2021-09-08 DIAGNOSIS — N2581 Secondary hyperparathyroidism of renal origin: Secondary | ICD-10-CM | POA: Diagnosis not present

## 2021-09-08 DIAGNOSIS — Z79899 Other long term (current) drug therapy: Secondary | ICD-10-CM | POA: Diagnosis not present

## 2021-09-08 DIAGNOSIS — K769 Liver disease, unspecified: Secondary | ICD-10-CM | POA: Diagnosis not present

## 2021-09-08 DIAGNOSIS — N186 End stage renal disease: Secondary | ICD-10-CM | POA: Diagnosis not present

## 2021-09-08 DIAGNOSIS — Z992 Dependence on renal dialysis: Secondary | ICD-10-CM | POA: Diagnosis not present

## 2021-09-09 DIAGNOSIS — R82998 Other abnormal findings in urine: Secondary | ICD-10-CM | POA: Diagnosis not present

## 2021-09-09 DIAGNOSIS — Z79899 Other long term (current) drug therapy: Secondary | ICD-10-CM | POA: Diagnosis not present

## 2021-09-09 DIAGNOSIS — Z992 Dependence on renal dialysis: Secondary | ICD-10-CM | POA: Diagnosis not present

## 2021-09-09 DIAGNOSIS — N186 End stage renal disease: Secondary | ICD-10-CM | POA: Diagnosis not present

## 2021-09-09 DIAGNOSIS — E440 Moderate protein-calorie malnutrition: Secondary | ICD-10-CM | POA: Diagnosis not present

## 2021-09-09 DIAGNOSIS — Z4932 Encounter for adequacy testing for peritoneal dialysis: Secondary | ICD-10-CM | POA: Diagnosis not present

## 2021-09-09 DIAGNOSIS — D631 Anemia in chronic kidney disease: Secondary | ICD-10-CM | POA: Diagnosis not present

## 2021-09-09 DIAGNOSIS — K769 Liver disease, unspecified: Secondary | ICD-10-CM | POA: Diagnosis not present

## 2021-09-09 DIAGNOSIS — D509 Iron deficiency anemia, unspecified: Secondary | ICD-10-CM | POA: Diagnosis not present

## 2021-09-09 DIAGNOSIS — N2581 Secondary hyperparathyroidism of renal origin: Secondary | ICD-10-CM | POA: Diagnosis not present

## 2021-09-10 DIAGNOSIS — Z79899 Other long term (current) drug therapy: Secondary | ICD-10-CM | POA: Diagnosis not present

## 2021-09-10 DIAGNOSIS — Z4932 Encounter for adequacy testing for peritoneal dialysis: Secondary | ICD-10-CM | POA: Diagnosis not present

## 2021-09-10 DIAGNOSIS — R82998 Other abnormal findings in urine: Secondary | ICD-10-CM | POA: Diagnosis not present

## 2021-09-10 DIAGNOSIS — N186 End stage renal disease: Secondary | ICD-10-CM | POA: Diagnosis not present

## 2021-09-10 DIAGNOSIS — D631 Anemia in chronic kidney disease: Secondary | ICD-10-CM | POA: Diagnosis not present

## 2021-09-10 DIAGNOSIS — E440 Moderate protein-calorie malnutrition: Secondary | ICD-10-CM | POA: Diagnosis not present

## 2021-09-10 DIAGNOSIS — D509 Iron deficiency anemia, unspecified: Secondary | ICD-10-CM | POA: Diagnosis not present

## 2021-09-10 DIAGNOSIS — K769 Liver disease, unspecified: Secondary | ICD-10-CM | POA: Diagnosis not present

## 2021-09-10 DIAGNOSIS — N2581 Secondary hyperparathyroidism of renal origin: Secondary | ICD-10-CM | POA: Diagnosis not present

## 2021-09-10 DIAGNOSIS — Z992 Dependence on renal dialysis: Secondary | ICD-10-CM | POA: Diagnosis not present

## 2021-09-11 DIAGNOSIS — E440 Moderate protein-calorie malnutrition: Secondary | ICD-10-CM | POA: Diagnosis not present

## 2021-09-11 DIAGNOSIS — Z4932 Encounter for adequacy testing for peritoneal dialysis: Secondary | ICD-10-CM | POA: Diagnosis not present

## 2021-09-11 DIAGNOSIS — R82998 Other abnormal findings in urine: Secondary | ICD-10-CM | POA: Diagnosis not present

## 2021-09-11 DIAGNOSIS — Z79899 Other long term (current) drug therapy: Secondary | ICD-10-CM | POA: Diagnosis not present

## 2021-09-11 DIAGNOSIS — Z992 Dependence on renal dialysis: Secondary | ICD-10-CM | POA: Diagnosis not present

## 2021-09-11 DIAGNOSIS — N186 End stage renal disease: Secondary | ICD-10-CM | POA: Diagnosis not present

## 2021-09-11 DIAGNOSIS — N2581 Secondary hyperparathyroidism of renal origin: Secondary | ICD-10-CM | POA: Diagnosis not present

## 2021-09-11 DIAGNOSIS — D509 Iron deficiency anemia, unspecified: Secondary | ICD-10-CM | POA: Diagnosis not present

## 2021-09-11 DIAGNOSIS — D631 Anemia in chronic kidney disease: Secondary | ICD-10-CM | POA: Diagnosis not present

## 2021-09-11 DIAGNOSIS — K769 Liver disease, unspecified: Secondary | ICD-10-CM | POA: Diagnosis not present

## 2021-09-12 DIAGNOSIS — Z79899 Other long term (current) drug therapy: Secondary | ICD-10-CM | POA: Diagnosis not present

## 2021-09-12 DIAGNOSIS — D509 Iron deficiency anemia, unspecified: Secondary | ICD-10-CM | POA: Diagnosis not present

## 2021-09-12 DIAGNOSIS — Z992 Dependence on renal dialysis: Secondary | ICD-10-CM | POA: Diagnosis not present

## 2021-09-12 DIAGNOSIS — R82998 Other abnormal findings in urine: Secondary | ICD-10-CM | POA: Diagnosis not present

## 2021-09-12 DIAGNOSIS — N186 End stage renal disease: Secondary | ICD-10-CM | POA: Diagnosis not present

## 2021-09-12 DIAGNOSIS — E440 Moderate protein-calorie malnutrition: Secondary | ICD-10-CM | POA: Diagnosis not present

## 2021-09-12 DIAGNOSIS — K769 Liver disease, unspecified: Secondary | ICD-10-CM | POA: Diagnosis not present

## 2021-09-12 DIAGNOSIS — N2581 Secondary hyperparathyroidism of renal origin: Secondary | ICD-10-CM | POA: Diagnosis not present

## 2021-09-12 DIAGNOSIS — D631 Anemia in chronic kidney disease: Secondary | ICD-10-CM | POA: Diagnosis not present

## 2021-09-12 DIAGNOSIS — Z4932 Encounter for adequacy testing for peritoneal dialysis: Secondary | ICD-10-CM | POA: Diagnosis not present

## 2021-09-13 DIAGNOSIS — R82998 Other abnormal findings in urine: Secondary | ICD-10-CM | POA: Diagnosis not present

## 2021-09-13 DIAGNOSIS — E440 Moderate protein-calorie malnutrition: Secondary | ICD-10-CM | POA: Diagnosis not present

## 2021-09-13 DIAGNOSIS — N186 End stage renal disease: Secondary | ICD-10-CM | POA: Diagnosis not present

## 2021-09-13 DIAGNOSIS — Z4932 Encounter for adequacy testing for peritoneal dialysis: Secondary | ICD-10-CM | POA: Diagnosis not present

## 2021-09-13 DIAGNOSIS — Z79899 Other long term (current) drug therapy: Secondary | ICD-10-CM | POA: Diagnosis not present

## 2021-09-13 DIAGNOSIS — D509 Iron deficiency anemia, unspecified: Secondary | ICD-10-CM | POA: Diagnosis not present

## 2021-09-13 DIAGNOSIS — K769 Liver disease, unspecified: Secondary | ICD-10-CM | POA: Diagnosis not present

## 2021-09-13 DIAGNOSIS — Z992 Dependence on renal dialysis: Secondary | ICD-10-CM | POA: Diagnosis not present

## 2021-09-13 DIAGNOSIS — D631 Anemia in chronic kidney disease: Secondary | ICD-10-CM | POA: Diagnosis not present

## 2021-09-13 DIAGNOSIS — N2581 Secondary hyperparathyroidism of renal origin: Secondary | ICD-10-CM | POA: Diagnosis not present

## 2021-09-14 DIAGNOSIS — N2581 Secondary hyperparathyroidism of renal origin: Secondary | ICD-10-CM | POA: Diagnosis not present

## 2021-09-14 DIAGNOSIS — Z79899 Other long term (current) drug therapy: Secondary | ICD-10-CM | POA: Diagnosis not present

## 2021-09-14 DIAGNOSIS — D631 Anemia in chronic kidney disease: Secondary | ICD-10-CM | POA: Diagnosis not present

## 2021-09-14 DIAGNOSIS — D509 Iron deficiency anemia, unspecified: Secondary | ICD-10-CM | POA: Diagnosis not present

## 2021-09-14 DIAGNOSIS — N186 End stage renal disease: Secondary | ICD-10-CM | POA: Diagnosis not present

## 2021-09-14 DIAGNOSIS — Z992 Dependence on renal dialysis: Secondary | ICD-10-CM | POA: Diagnosis not present

## 2021-09-14 DIAGNOSIS — Z4932 Encounter for adequacy testing for peritoneal dialysis: Secondary | ICD-10-CM | POA: Diagnosis not present

## 2021-09-14 DIAGNOSIS — E440 Moderate protein-calorie malnutrition: Secondary | ICD-10-CM | POA: Diagnosis not present

## 2021-09-14 DIAGNOSIS — K769 Liver disease, unspecified: Secondary | ICD-10-CM | POA: Diagnosis not present

## 2021-09-14 DIAGNOSIS — R82998 Other abnormal findings in urine: Secondary | ICD-10-CM | POA: Diagnosis not present

## 2021-09-15 DIAGNOSIS — E440 Moderate protein-calorie malnutrition: Secondary | ICD-10-CM | POA: Diagnosis not present

## 2021-09-15 DIAGNOSIS — K769 Liver disease, unspecified: Secondary | ICD-10-CM | POA: Diagnosis not present

## 2021-09-15 DIAGNOSIS — D631 Anemia in chronic kidney disease: Secondary | ICD-10-CM | POA: Diagnosis not present

## 2021-09-15 DIAGNOSIS — Z992 Dependence on renal dialysis: Secondary | ICD-10-CM | POA: Diagnosis not present

## 2021-09-15 DIAGNOSIS — R82998 Other abnormal findings in urine: Secondary | ICD-10-CM | POA: Diagnosis not present

## 2021-09-15 DIAGNOSIS — Z79899 Other long term (current) drug therapy: Secondary | ICD-10-CM | POA: Diagnosis not present

## 2021-09-15 DIAGNOSIS — D509 Iron deficiency anemia, unspecified: Secondary | ICD-10-CM | POA: Diagnosis not present

## 2021-09-15 DIAGNOSIS — N2581 Secondary hyperparathyroidism of renal origin: Secondary | ICD-10-CM | POA: Diagnosis not present

## 2021-09-15 DIAGNOSIS — Z4932 Encounter for adequacy testing for peritoneal dialysis: Secondary | ICD-10-CM | POA: Diagnosis not present

## 2021-09-15 DIAGNOSIS — N186 End stage renal disease: Secondary | ICD-10-CM | POA: Diagnosis not present

## 2021-09-16 DIAGNOSIS — Z4932 Encounter for adequacy testing for peritoneal dialysis: Secondary | ICD-10-CM | POA: Diagnosis not present

## 2021-09-16 DIAGNOSIS — D631 Anemia in chronic kidney disease: Secondary | ICD-10-CM | POA: Diagnosis not present

## 2021-09-16 DIAGNOSIS — Z79899 Other long term (current) drug therapy: Secondary | ICD-10-CM | POA: Diagnosis not present

## 2021-09-16 DIAGNOSIS — N186 End stage renal disease: Secondary | ICD-10-CM | POA: Diagnosis not present

## 2021-09-16 DIAGNOSIS — Z992 Dependence on renal dialysis: Secondary | ICD-10-CM | POA: Diagnosis not present

## 2021-09-16 DIAGNOSIS — N2581 Secondary hyperparathyroidism of renal origin: Secondary | ICD-10-CM | POA: Diagnosis not present

## 2021-09-16 DIAGNOSIS — D509 Iron deficiency anemia, unspecified: Secondary | ICD-10-CM | POA: Diagnosis not present

## 2021-09-16 DIAGNOSIS — K769 Liver disease, unspecified: Secondary | ICD-10-CM | POA: Diagnosis not present

## 2021-09-16 DIAGNOSIS — R82998 Other abnormal findings in urine: Secondary | ICD-10-CM | POA: Diagnosis not present

## 2021-09-16 DIAGNOSIS — E440 Moderate protein-calorie malnutrition: Secondary | ICD-10-CM | POA: Diagnosis not present

## 2021-09-17 DIAGNOSIS — K769 Liver disease, unspecified: Secondary | ICD-10-CM | POA: Diagnosis not present

## 2021-09-17 DIAGNOSIS — E440 Moderate protein-calorie malnutrition: Secondary | ICD-10-CM | POA: Diagnosis not present

## 2021-09-17 DIAGNOSIS — R82998 Other abnormal findings in urine: Secondary | ICD-10-CM | POA: Diagnosis not present

## 2021-09-17 DIAGNOSIS — N2581 Secondary hyperparathyroidism of renal origin: Secondary | ICD-10-CM | POA: Diagnosis not present

## 2021-09-17 DIAGNOSIS — Z992 Dependence on renal dialysis: Secondary | ICD-10-CM | POA: Diagnosis not present

## 2021-09-17 DIAGNOSIS — Z79899 Other long term (current) drug therapy: Secondary | ICD-10-CM | POA: Diagnosis not present

## 2021-09-17 DIAGNOSIS — D631 Anemia in chronic kidney disease: Secondary | ICD-10-CM | POA: Diagnosis not present

## 2021-09-17 DIAGNOSIS — N186 End stage renal disease: Secondary | ICD-10-CM | POA: Diagnosis not present

## 2021-09-17 DIAGNOSIS — Z4932 Encounter for adequacy testing for peritoneal dialysis: Secondary | ICD-10-CM | POA: Diagnosis not present

## 2021-09-17 DIAGNOSIS — D509 Iron deficiency anemia, unspecified: Secondary | ICD-10-CM | POA: Diagnosis not present

## 2021-09-18 DIAGNOSIS — I129 Hypertensive chronic kidney disease with stage 1 through stage 4 chronic kidney disease, or unspecified chronic kidney disease: Secondary | ICD-10-CM | POA: Diagnosis not present

## 2021-09-18 DIAGNOSIS — N2581 Secondary hyperparathyroidism of renal origin: Secondary | ICD-10-CM | POA: Diagnosis not present

## 2021-09-18 DIAGNOSIS — Z4932 Encounter for adequacy testing for peritoneal dialysis: Secondary | ICD-10-CM | POA: Diagnosis not present

## 2021-09-18 DIAGNOSIS — Z79899 Other long term (current) drug therapy: Secondary | ICD-10-CM | POA: Diagnosis not present

## 2021-09-18 DIAGNOSIS — R82998 Other abnormal findings in urine: Secondary | ICD-10-CM | POA: Diagnosis not present

## 2021-09-18 DIAGNOSIS — E440 Moderate protein-calorie malnutrition: Secondary | ICD-10-CM | POA: Diagnosis not present

## 2021-09-18 DIAGNOSIS — Z992 Dependence on renal dialysis: Secondary | ICD-10-CM | POA: Diagnosis not present

## 2021-09-18 DIAGNOSIS — D509 Iron deficiency anemia, unspecified: Secondary | ICD-10-CM | POA: Diagnosis not present

## 2021-09-18 DIAGNOSIS — K769 Liver disease, unspecified: Secondary | ICD-10-CM | POA: Diagnosis not present

## 2021-09-18 DIAGNOSIS — D631 Anemia in chronic kidney disease: Secondary | ICD-10-CM | POA: Diagnosis not present

## 2021-09-18 DIAGNOSIS — N186 End stage renal disease: Secondary | ICD-10-CM | POA: Diagnosis not present

## 2021-09-19 DIAGNOSIS — Z4932 Encounter for adequacy testing for peritoneal dialysis: Secondary | ICD-10-CM | POA: Diagnosis not present

## 2021-09-19 DIAGNOSIS — N186 End stage renal disease: Secondary | ICD-10-CM | POA: Diagnosis not present

## 2021-09-19 DIAGNOSIS — Z23 Encounter for immunization: Secondary | ICD-10-CM | POA: Diagnosis not present

## 2021-09-19 DIAGNOSIS — N2581 Secondary hyperparathyroidism of renal origin: Secondary | ICD-10-CM | POA: Diagnosis not present

## 2021-09-19 DIAGNOSIS — K769 Liver disease, unspecified: Secondary | ICD-10-CM | POA: Diagnosis not present

## 2021-09-19 DIAGNOSIS — Z992 Dependence on renal dialysis: Secondary | ICD-10-CM | POA: Diagnosis not present

## 2021-09-19 DIAGNOSIS — D631 Anemia in chronic kidney disease: Secondary | ICD-10-CM | POA: Diagnosis not present

## 2021-09-19 DIAGNOSIS — R82998 Other abnormal findings in urine: Secondary | ICD-10-CM | POA: Diagnosis not present

## 2021-09-19 DIAGNOSIS — N2589 Other disorders resulting from impaired renal tubular function: Secondary | ICD-10-CM | POA: Diagnosis not present

## 2021-09-19 DIAGNOSIS — D509 Iron deficiency anemia, unspecified: Secondary | ICD-10-CM | POA: Diagnosis not present

## 2021-09-20 DIAGNOSIS — N2589 Other disorders resulting from impaired renal tubular function: Secondary | ICD-10-CM | POA: Diagnosis not present

## 2021-09-20 DIAGNOSIS — D509 Iron deficiency anemia, unspecified: Secondary | ICD-10-CM | POA: Diagnosis not present

## 2021-09-20 DIAGNOSIS — N2581 Secondary hyperparathyroidism of renal origin: Secondary | ICD-10-CM | POA: Diagnosis not present

## 2021-09-20 DIAGNOSIS — D631 Anemia in chronic kidney disease: Secondary | ICD-10-CM | POA: Diagnosis not present

## 2021-09-20 DIAGNOSIS — N186 End stage renal disease: Secondary | ICD-10-CM | POA: Diagnosis not present

## 2021-09-20 DIAGNOSIS — Z4932 Encounter for adequacy testing for peritoneal dialysis: Secondary | ICD-10-CM | POA: Diagnosis not present

## 2021-09-20 DIAGNOSIS — Z23 Encounter for immunization: Secondary | ICD-10-CM | POA: Diagnosis not present

## 2021-09-20 DIAGNOSIS — Z992 Dependence on renal dialysis: Secondary | ICD-10-CM | POA: Diagnosis not present

## 2021-09-20 DIAGNOSIS — K769 Liver disease, unspecified: Secondary | ICD-10-CM | POA: Diagnosis not present

## 2021-09-20 DIAGNOSIS — R82998 Other abnormal findings in urine: Secondary | ICD-10-CM | POA: Diagnosis not present

## 2021-09-21 DIAGNOSIS — D631 Anemia in chronic kidney disease: Secondary | ICD-10-CM | POA: Diagnosis not present

## 2021-09-21 DIAGNOSIS — R82998 Other abnormal findings in urine: Secondary | ICD-10-CM | POA: Diagnosis not present

## 2021-09-21 DIAGNOSIS — N186 End stage renal disease: Secondary | ICD-10-CM | POA: Diagnosis not present

## 2021-09-21 DIAGNOSIS — N2589 Other disorders resulting from impaired renal tubular function: Secondary | ICD-10-CM | POA: Diagnosis not present

## 2021-09-21 DIAGNOSIS — Z992 Dependence on renal dialysis: Secondary | ICD-10-CM | POA: Diagnosis not present

## 2021-09-21 DIAGNOSIS — N2581 Secondary hyperparathyroidism of renal origin: Secondary | ICD-10-CM | POA: Diagnosis not present

## 2021-09-21 DIAGNOSIS — K769 Liver disease, unspecified: Secondary | ICD-10-CM | POA: Diagnosis not present

## 2021-09-21 DIAGNOSIS — Z23 Encounter for immunization: Secondary | ICD-10-CM | POA: Diagnosis not present

## 2021-09-21 DIAGNOSIS — Z4932 Encounter for adequacy testing for peritoneal dialysis: Secondary | ICD-10-CM | POA: Diagnosis not present

## 2021-09-21 DIAGNOSIS — D509 Iron deficiency anemia, unspecified: Secondary | ICD-10-CM | POA: Diagnosis not present

## 2021-09-22 DIAGNOSIS — Z23 Encounter for immunization: Secondary | ICD-10-CM | POA: Diagnosis not present

## 2021-09-22 DIAGNOSIS — K769 Liver disease, unspecified: Secondary | ICD-10-CM | POA: Diagnosis not present

## 2021-09-22 DIAGNOSIS — D631 Anemia in chronic kidney disease: Secondary | ICD-10-CM | POA: Diagnosis not present

## 2021-09-22 DIAGNOSIS — Z4932 Encounter for adequacy testing for peritoneal dialysis: Secondary | ICD-10-CM | POA: Diagnosis not present

## 2021-09-22 DIAGNOSIS — Z992 Dependence on renal dialysis: Secondary | ICD-10-CM | POA: Diagnosis not present

## 2021-09-22 DIAGNOSIS — R82998 Other abnormal findings in urine: Secondary | ICD-10-CM | POA: Diagnosis not present

## 2021-09-22 DIAGNOSIS — N2589 Other disorders resulting from impaired renal tubular function: Secondary | ICD-10-CM | POA: Diagnosis not present

## 2021-09-22 DIAGNOSIS — N186 End stage renal disease: Secondary | ICD-10-CM | POA: Diagnosis not present

## 2021-09-22 DIAGNOSIS — D509 Iron deficiency anemia, unspecified: Secondary | ICD-10-CM | POA: Diagnosis not present

## 2021-09-22 DIAGNOSIS — N2581 Secondary hyperparathyroidism of renal origin: Secondary | ICD-10-CM | POA: Diagnosis not present

## 2021-09-23 ENCOUNTER — Ambulatory Visit (INDEPENDENT_AMBULATORY_CARE_PROVIDER_SITE_OTHER): Payer: Medicare Other

## 2021-09-23 ENCOUNTER — Other Ambulatory Visit: Payer: Self-pay

## 2021-09-23 ENCOUNTER — Encounter: Payer: Self-pay | Admitting: Internal Medicine

## 2021-09-23 ENCOUNTER — Ambulatory Visit (INDEPENDENT_AMBULATORY_CARE_PROVIDER_SITE_OTHER): Payer: Medicare Other | Admitting: Internal Medicine

## 2021-09-23 VITALS — BP 108/68 | HR 99 | Temp 98.0°F | Ht 70.0 in | Wt 188.2 lb

## 2021-09-23 DIAGNOSIS — J90 Pleural effusion, not elsewhere classified: Secondary | ICD-10-CM

## 2021-09-23 DIAGNOSIS — J9611 Chronic respiratory failure with hypoxia: Secondary | ICD-10-CM

## 2021-09-23 DIAGNOSIS — Z23 Encounter for immunization: Secondary | ICD-10-CM | POA: Diagnosis not present

## 2021-09-23 DIAGNOSIS — I4811 Longstanding persistent atrial fibrillation: Secondary | ICD-10-CM

## 2021-09-23 DIAGNOSIS — J9612 Chronic respiratory failure with hypercapnia: Secondary | ICD-10-CM

## 2021-09-23 DIAGNOSIS — N186 End stage renal disease: Secondary | ICD-10-CM | POA: Diagnosis not present

## 2021-09-23 DIAGNOSIS — J449 Chronic obstructive pulmonary disease, unspecified: Secondary | ICD-10-CM | POA: Diagnosis not present

## 2021-09-23 DIAGNOSIS — Z992 Dependence on renal dialysis: Secondary | ICD-10-CM | POA: Diagnosis not present

## 2021-09-23 DIAGNOSIS — D631 Anemia in chronic kidney disease: Secondary | ICD-10-CM | POA: Diagnosis not present

## 2021-09-23 DIAGNOSIS — R82998 Other abnormal findings in urine: Secondary | ICD-10-CM | POA: Diagnosis not present

## 2021-09-23 DIAGNOSIS — N2581 Secondary hyperparathyroidism of renal origin: Secondary | ICD-10-CM | POA: Diagnosis not present

## 2021-09-23 DIAGNOSIS — Z4932 Encounter for adequacy testing for peritoneal dialysis: Secondary | ICD-10-CM | POA: Diagnosis not present

## 2021-09-23 DIAGNOSIS — R0602 Shortness of breath: Secondary | ICD-10-CM | POA: Diagnosis not present

## 2021-09-23 DIAGNOSIS — D509 Iron deficiency anemia, unspecified: Secondary | ICD-10-CM | POA: Diagnosis not present

## 2021-09-23 DIAGNOSIS — K769 Liver disease, unspecified: Secondary | ICD-10-CM | POA: Diagnosis not present

## 2021-09-23 DIAGNOSIS — N2589 Other disorders resulting from impaired renal tubular function: Secondary | ICD-10-CM | POA: Diagnosis not present

## 2021-09-23 MED ORDER — TRELEGY ELLIPTA 100-62.5-25 MCG/INH IN AEPB: 1.0000 | INHALATION_SPRAY | Freq: Every day | RESPIRATORY_TRACT | 0 refills | Status: DC

## 2021-09-23 NOTE — Assessment & Plan Note (Signed)
New onset 02/2019 in setting of CAP , respiratory failure, septic shock, AKI. Failed cardioversion twice during March admission- Amio added then with plans to attempt eventual DCCV (delayed secondary to COPD, and COVID).  Meanwhile patient admitted again with respiratory failure in June 2020. He eventually appears to have permanent atrial fibrillation and Amiodarone D/C'd and low dose Toprol added Nov 2021  Poor rate control off BB 09/23/2021 > rec cards/ nephrology rx

## 2021-09-23 NOTE — Progress Notes (Addendum)
David Barton, male    DOB: Oct 07, 1933    MRN: [id number]   Brief patient profile:  60  yowm  MM/quit smoking 1983  At wt 180 with baseline able to walk the dog 10 minutes slow pace but  avoided steps maint on spiriva with GOLD II COPD criteria 07/16/13     History of Present Illness  04/24/2019  Pulmonary/  office eval/David Barton / 02 4lpm 24/7 / persistent afib and started amiodarone since last ov Chief Complaint  Patient presents with   Pulmonary Consult    Referred by Kerin Ransom, PA. Pt c/o SOB since March 2020. Pt c/o DOE with walking from room to room.  He has had some cough with clear to bloody sputum.     Dyspnea:  Gradually losing ground now Room to room gives out due to sob even on 4lpm  Cough: more bloody/ no pain with cough Sleep: on flat bed, 3 pillows on back SABA BDZ:HGDJME helps the most  rec Stop spiriva and take the  duoneb up to 4 x in 24 hours as you feel you need it if it helps your shortness of breath For cough/congestion >  mucinex or mucinex dm up to 1200 mg every 12 hours and use flutter valve  as much as you can  Humidify 02 as much of the time as your can    televisit  12/30/20 For cough / congestion >>>  mucinex dm 1200 mg every 12 hours as needed  (2 x 600 every 12 hours)  Plan A = Automatic = Always=    Continue trelegy one click  Plan B = Backup (to supplement plan A, not to replace it) Only use your albuterol inhaler as a rescue medication Also ok to try albuterol 15 min before an activity   01/19/2021  f/u ov/David Barton re:  GOLD II/ did not bring meds, easily confused with details of care. maint on trelegy but wrose x one month /sob  Chief Complaint  Patient presents with   Follow-up    Sob-worse,cough-clear  Dyspnea:  Walking around the house sev times wears him out, has not tried saba first as rec  Cough: mucus  Sleeping: cough worse at hs at 30 degrees electric   SABA use: none  02: none  rec For cough / congestion >>>  mucinex dm 1200 mg every 12  hours as needed  (2 x 600 every 12 hours)  And use the flutter valve as much as possible  Make sure you check your oxygen saturation at your highest level of activity  Pantoprazole (protonix) 40 mg   Take  30-60 min before first meal of the day and Pepcid (famotidine)  20 mg one an hour before   bedtime until return to office - this is the best way to tell whether stomach acid is contributing to your problem.   GERD diet Prednisone 10 mg take  4 each am x 2 days,   2 each am x 2 days,  1 each am x 2 days and stop    02/18/2021  f/u ov/David Barton re:  GOLD II / no meds with trelegy only  Chief Complaint  Patient presents with   Follow-up    Sob with exertion, dry cough last night  Dyspnea:  Walk 5 min flat and could do more/ starting to do treadmill  Cough: better and not using mucinex dm  Sleeping: 30 degrees electric bed/ on PD nightly  SABA use: none  02: none  Covid status:  moderna x 3  Rec Continue same medications which should include pepcid 20 mg (famotidine)  at least an hour before bed.  Please schedule a follow up visit in 6 months but call sooner if needed  with all medications /inhalers/ solutions in hand   09/23/2021  f/u ov/David Barton re: GOLD 2 copd   maint on trelegy / qhs HD   Chief Complaint  Patient presents with   Follow-up    Has had a productive cough for 4 months, mucus is clear. SOB has been the same. Patient states having a hard time walking.   Dyspnea:  walking up landing and in house gives out, not checking sats  Cough: improving but still daytime congestion, no worse at hs and no excess or purulent sputum Sleeping: almost flat / one pillow  SABA use: not using  02: none  Covid status:   vax x 3  HR up since says nehrology d/c'd lopressor    No obvious patterns in  day to day or daytime variability or assoc mucus plugs or hemoptysis or cp or chest tightness, subjective wheeze or overt sinus or hb symptoms.   Sleeping  without nocturnal  or early am exacerbation  of  respiratory  c/o's or need for noct saba. Also denies any obvious fluctuation of symptoms with weather or environmental changes or other aggravating or alleviating factors except as outlined above   No unusual exposure hx or h/o childhood pna/ asthma or knowledge of premature birth.  Current Allergies, Complete Past Medical History, Past Surgical History, Family History, and Social History were reviewed in [employer] [facility] record.  ROS  The following are not active complaints unless bolded Hoarseness, sore throat, dysphagia, dental problems, itching, sneezing,  nasal congestion or discharge of excess mucus or purulent secretions, ear ache,   fever, chills, sweats, unintended wt loss or wt gain, classically pleuritic or exertional cp,  orthopnea pnd or arm/hand swelling  or leg swelling, presyncope, palpitations, abdominal pain, anorexia, nausea, vomiting, diarrhea  or change in bowel habits or change in bladder habits, change in stools or change in urine, dysuria, hematuria,  rash, arthralgias, visual complaints, headache, numbness, weakness or ataxia or problems with walking or coordination,  change in mood or  memory.        Current Meds  Medication Sig   albuterol (PROAIR HFA) 108 (90 Base) MCG/ACT inhaler 2 puffs every 4 hours as needed only  if your can't catch your breath (Patient taking differently: 2 puffs every 4 hours as needed only  if your can't catch your breath)   apixaban (ELIQUIS) 2.5 MG TABS tablet TAKE 1 TABLET(2.5 MG) BY MOUTH TWICE DAILY   famotidine (PEPCID) 20 MG tablet One after supper   multivitamin (RENA-VIT) TABS tablet Take 1 tablet by mouth at bedtime.   pantoprazole (PROTONIX) 40 MG tablet Take 1 tablet (40 mg total) by mouth daily. Take 30-60 min before first meal of the day   sevelamer carbonate (RENVELA) 800 MG tablet Take 1 tablet (800 mg total) by mouth 3 (three) times daily with meals.   TRELEGY ELLIPTA 100-62.5-25 MCG/INH AEPB INHALE 1 PUFF INTO  THE LUNGS DAILY   zolpidem (AMBIEN) 10 MG tablet Take 10 mg by mouth at bedtime as needed.                 Objective:   09/23/2021        188  02/18/2021  183  01/19/2021        183  09/17/2020        184  11/05/2019      190   08/03/2019       195   06/27/2019         208   04/24/19 231 lb (104.8 kg)  04/17/19 225 lb (102.1 kg)  03/26/19 226 lb 3.1 oz (102.6 kg)   Vital signs reviewed  09/23/2021  - Note at rest 02 sats  93% on RA   General appearance:    hoarse amb wm nad / dry sounding voluntary cough    HEENT : pt wearing mask not removed for exam due to covid - 19 concerns.    NECK :  without JVD/Nodes/TM/ nl carotid upstrokes bilaterally   LUNGS: no acc muscle use,  Mild barrel  contour chest wall with bilateral  Distant bs esp R base s audible wheeze and  without cough on insp or exp maneuvers  and mild  Hyperresonant  to  percussion bilaterally     CV:  IRIR with apical pulse 100 no s3 or murmur or increase in P2, and no edema   ABD:  soft and nontender with pos end  insp Hoover's  in the supine position. No bruits or organomegaly appreciated, bowel sounds nl  MS:   Nl gait/  ext warm without deformities, calf tenderness, cyanosis or clubbing No obvious joint restrictions   SKIN: warm and dry without lesions    NEURO:  alert, approp, nl sensorium with  no motor or cerebellar deficits apparent.         CXR PA and Lateral:   09/23/2021 :    I personally reviewed images and   impression is as follows:   1. New moderate right pleural effusion. 2. Right upper lobe airspace disease concerning for pneumonia.       Assessment

## 2021-09-23 NOTE — Assessment & Plan Note (Signed)
Onset 02/2019 secondary to cap, chf/ copd HC03 05/04/2019 = 32  HCO3  06/14/19   = 25  - Pt d/c'd 02 on his own end of Oct 2020 and again 12/26/2019  - 02/18/2021   Walked on RA x one lap =  approx 250 ft -@ slow pace stopped due to legs tired/hurt    But no sob and sats 100% at end  09/23/2021  Patient Saturations on Room Air at Rest = 91% [employer] while Ambulating = 88% >>>  2 Liters of oxygen while Ambulating = 91%  rec titrate to keep sats > 90% at all times  Each maintenance medication was reviewed in detail including emphasizing most importantly the difference between maintenance and prns and under what circumstances the prns are to be triggered using an action plan format where appropriate.  Total time for H and P, chart review, counseling, reviewing dpi/ hfa /02  device(s) , directly observing portions of ambulatory 02 saturation study/ and generating customized AVS unique to this office visit / same day charting = 31 min

## 2021-09-23 NOTE — Patient Instructions (Addendum)
Please remember to go to the  x-ray department  for your tests - we will call you with the results when they are available.  No change medications    Make sure you check your oxygen saturation at your highest level of activity to be sure it stays over 90% and keep track of it at least once a week, more often if breathing getting worse, and let me know if losing ground.     Please schedule a follow up visit in 6 months but call sooner if needed         Late add:  ? RUL pna / R effusion rec levaquin 500 x 7days and repeat ov with cxr in 14 days

## 2021-09-23 NOTE — Assessment & Plan Note (Addendum)
R throaceneteis 05/21/19   =  1.85 l slt bloody exudative, nl glucose, nl cells on path review Repeat 06/02/19 x 1.4 liters Repeat 6/21/0  x 2.3 liters dark red  cyt neg  11/05/2019 resolved as of 11/05/2019 p  HD 11/03/2019  - 09/17/2020 minimal decreased aeration R base s obvious pl effusion  - 12/30/2020 cxr slt wet appearing cxr ? Pre or post HD with no def effusions   Recurrent without other obvious signs of vol ovload but needs to keep as dry as tol in this setting esp with underlying copd (see separate a/p)   May have assoc RUL pna  > rx levaquin 500 mg daily x 7 days then repeat cxr

## 2021-09-23 NOTE — Assessment & Plan Note (Signed)
Quit smoking 1983  - PFTs 07/16/2013  FEV1  1.75 (61%) ratio 52 and 15% better p B2, DLCO 74% - 06/27/2019  After extensive coaching inhaler device,  effectiveness =    90% with dpi so changed to trelegy maint and proair respiclick> improved as of ov 08/03/2019  -Labs  01/19/2021  :  allergy profile   alpha one AT phenotype  MM level 279  - Allergy profile 01/19/21  >  Eos 0.4 /  IgE  10    Group D in terms of symptom/risk and laba/lama/ICS  therefore appropriate rx at this point >>>  Continue trelegy and prn saba   New R effusion is likely the cause for increase sob > Note that pleural effusion and copd have the same effect on insp muscles/mechanics (both shorten their length prior to inspiration making them weaker with less force reserve) so they are synergistic in causing sob.

## 2021-09-24 ENCOUNTER — Telehealth: Payer: Self-pay | Admitting: Internal Medicine

## 2021-09-24 DIAGNOSIS — N186 End stage renal disease: Secondary | ICD-10-CM | POA: Diagnosis not present

## 2021-09-24 DIAGNOSIS — N2581 Secondary hyperparathyroidism of renal origin: Secondary | ICD-10-CM | POA: Diagnosis not present

## 2021-09-24 DIAGNOSIS — R0602 Shortness of breath: Secondary | ICD-10-CM | POA: Diagnosis not present

## 2021-09-24 DIAGNOSIS — J90 Pleural effusion, not elsewhere classified: Secondary | ICD-10-CM | POA: Diagnosis not present

## 2021-09-24 DIAGNOSIS — Z4932 Encounter for adequacy testing for peritoneal dialysis: Secondary | ICD-10-CM | POA: Diagnosis not present

## 2021-09-24 DIAGNOSIS — N2589 Other disorders resulting from impaired renal tubular function: Secondary | ICD-10-CM | POA: Diagnosis not present

## 2021-09-24 DIAGNOSIS — J449 Chronic obstructive pulmonary disease, unspecified: Secondary | ICD-10-CM | POA: Diagnosis not present

## 2021-09-24 DIAGNOSIS — Z23 Encounter for immunization: Secondary | ICD-10-CM | POA: Diagnosis not present

## 2021-09-24 DIAGNOSIS — D509 Iron deficiency anemia, unspecified: Secondary | ICD-10-CM | POA: Diagnosis not present

## 2021-09-24 DIAGNOSIS — I5043 Acute on chronic combined systolic (congestive) and diastolic (congestive) heart failure: Secondary | ICD-10-CM | POA: Diagnosis not present

## 2021-09-24 DIAGNOSIS — R82998 Other abnormal findings in urine: Secondary | ICD-10-CM | POA: Diagnosis not present

## 2021-09-24 DIAGNOSIS — Z992 Dependence on renal dialysis: Secondary | ICD-10-CM | POA: Diagnosis not present

## 2021-09-24 DIAGNOSIS — R918 Other nonspecific abnormal finding of lung field: Secondary | ICD-10-CM | POA: Diagnosis not present

## 2021-09-24 DIAGNOSIS — K769 Liver disease, unspecified: Secondary | ICD-10-CM | POA: Diagnosis not present

## 2021-09-24 DIAGNOSIS — D631 Anemia in chronic kidney disease: Secondary | ICD-10-CM | POA: Diagnosis not present

## 2021-09-25 ENCOUNTER — Ambulatory Visit: Payer: Medicare Other

## 2021-09-25 ENCOUNTER — Telehealth: Payer: Self-pay | Admitting: Internal Medicine

## 2021-09-25 DIAGNOSIS — K769 Liver disease, unspecified: Secondary | ICD-10-CM | POA: Diagnosis not present

## 2021-09-25 DIAGNOSIS — N2581 Secondary hyperparathyroidism of renal origin: Secondary | ICD-10-CM | POA: Diagnosis not present

## 2021-09-25 DIAGNOSIS — R0609 Other forms of dyspnea: Secondary | ICD-10-CM

## 2021-09-25 DIAGNOSIS — D509 Iron deficiency anemia, unspecified: Secondary | ICD-10-CM | POA: Diagnosis not present

## 2021-09-25 DIAGNOSIS — R82998 Other abnormal findings in urine: Secondary | ICD-10-CM | POA: Diagnosis not present

## 2021-09-25 DIAGNOSIS — D631 Anemia in chronic kidney disease: Secondary | ICD-10-CM | POA: Diagnosis not present

## 2021-09-25 DIAGNOSIS — Z4932 Encounter for adequacy testing for peritoneal dialysis: Secondary | ICD-10-CM | POA: Diagnosis not present

## 2021-09-25 DIAGNOSIS — Z992 Dependence on renal dialysis: Secondary | ICD-10-CM | POA: Diagnosis not present

## 2021-09-25 DIAGNOSIS — N186 End stage renal disease: Secondary | ICD-10-CM | POA: Diagnosis not present

## 2021-09-25 DIAGNOSIS — Z23 Encounter for immunization: Secondary | ICD-10-CM | POA: Diagnosis not present

## 2021-09-25 DIAGNOSIS — N2589 Other disorders resulting from impaired renal tubular function: Secondary | ICD-10-CM | POA: Diagnosis not present

## 2021-09-25 MED ORDER — LEVOFLOXACIN 500 MG PO TABS: 500.0000 mg | ORAL_TABLET | Freq: Every day | ORAL | 0 refills | Status: DC

## 2021-09-25 NOTE — Telephone Encounter (Signed)
Spoke with pt and reviewed CXR results as dictated by Dr. Melvyn Novas. Reviewed instructions on Levaquin administration. Placed Levaquin and CXR orders. Pt verified pharmacy and stated understanding. Pt scheduled for OV with Dr. Melvyn Novas on 10/09/21. Nothing further needed at this time.

## 2021-09-25 NOTE — Telephone Encounter (Signed)
See result note for cxr report   Agree with plans for portable 02

## 2021-09-25 NOTE — Telephone Encounter (Signed)
I have called and spoke with pt.  He was wanting to clarify how he needs to wear the oxygen.    I advised him that per MW note from yesterday that he will wear the oxygen when he is up moving around to keep his oxygen sats above 90%.   Pt voiced his understanding of this .    He is also wanting to know the results of his cxr.  MW please advise. thanks

## 2021-09-26 DIAGNOSIS — Z992 Dependence on renal dialysis: Secondary | ICD-10-CM | POA: Diagnosis not present

## 2021-09-26 DIAGNOSIS — Z23 Encounter for immunization: Secondary | ICD-10-CM | POA: Diagnosis not present

## 2021-09-26 DIAGNOSIS — N186 End stage renal disease: Secondary | ICD-10-CM | POA: Diagnosis not present

## 2021-09-26 DIAGNOSIS — N2589 Other disorders resulting from impaired renal tubular function: Secondary | ICD-10-CM | POA: Diagnosis not present

## 2021-09-26 DIAGNOSIS — K769 Liver disease, unspecified: Secondary | ICD-10-CM | POA: Diagnosis not present

## 2021-09-26 DIAGNOSIS — N2581 Secondary hyperparathyroidism of renal origin: Secondary | ICD-10-CM | POA: Diagnosis not present

## 2021-09-26 DIAGNOSIS — Z4932 Encounter for adequacy testing for peritoneal dialysis: Secondary | ICD-10-CM | POA: Diagnosis not present

## 2021-09-26 DIAGNOSIS — D631 Anemia in chronic kidney disease: Secondary | ICD-10-CM | POA: Diagnosis not present

## 2021-09-26 DIAGNOSIS — R82998 Other abnormal findings in urine: Secondary | ICD-10-CM | POA: Diagnosis not present

## 2021-09-26 DIAGNOSIS — D509 Iron deficiency anemia, unspecified: Secondary | ICD-10-CM | POA: Diagnosis not present

## 2021-09-27 DIAGNOSIS — Z992 Dependence on renal dialysis: Secondary | ICD-10-CM | POA: Diagnosis not present

## 2021-09-27 DIAGNOSIS — D631 Anemia in chronic kidney disease: Secondary | ICD-10-CM | POA: Diagnosis not present

## 2021-09-27 DIAGNOSIS — N186 End stage renal disease: Secondary | ICD-10-CM | POA: Diagnosis not present

## 2021-09-27 DIAGNOSIS — Z4932 Encounter for adequacy testing for peritoneal dialysis: Secondary | ICD-10-CM | POA: Diagnosis not present

## 2021-09-27 DIAGNOSIS — R82998 Other abnormal findings in urine: Secondary | ICD-10-CM | POA: Diagnosis not present

## 2021-09-27 DIAGNOSIS — Z23 Encounter for immunization: Secondary | ICD-10-CM | POA: Diagnosis not present

## 2021-09-27 DIAGNOSIS — K769 Liver disease, unspecified: Secondary | ICD-10-CM | POA: Diagnosis not present

## 2021-09-27 DIAGNOSIS — N2581 Secondary hyperparathyroidism of renal origin: Secondary | ICD-10-CM | POA: Diagnosis not present

## 2021-09-27 DIAGNOSIS — D509 Iron deficiency anemia, unspecified: Secondary | ICD-10-CM | POA: Diagnosis not present

## 2021-09-27 DIAGNOSIS — N2589 Other disorders resulting from impaired renal tubular function: Secondary | ICD-10-CM | POA: Diagnosis not present

## 2021-09-28 DIAGNOSIS — D509 Iron deficiency anemia, unspecified: Secondary | ICD-10-CM | POA: Diagnosis not present

## 2021-09-28 DIAGNOSIS — R82998 Other abnormal findings in urine: Secondary | ICD-10-CM | POA: Diagnosis not present

## 2021-09-28 DIAGNOSIS — Z23 Encounter for immunization: Secondary | ICD-10-CM | POA: Diagnosis not present

## 2021-09-28 DIAGNOSIS — N186 End stage renal disease: Secondary | ICD-10-CM | POA: Diagnosis not present

## 2021-09-28 DIAGNOSIS — Z4932 Encounter for adequacy testing for peritoneal dialysis: Secondary | ICD-10-CM | POA: Diagnosis not present

## 2021-09-28 DIAGNOSIS — Z992 Dependence on renal dialysis: Secondary | ICD-10-CM | POA: Diagnosis not present

## 2021-09-28 DIAGNOSIS — D631 Anemia in chronic kidney disease: Secondary | ICD-10-CM | POA: Diagnosis not present

## 2021-09-28 DIAGNOSIS — N2589 Other disorders resulting from impaired renal tubular function: Secondary | ICD-10-CM | POA: Diagnosis not present

## 2021-09-28 DIAGNOSIS — K769 Liver disease, unspecified: Secondary | ICD-10-CM | POA: Diagnosis not present

## 2021-09-28 DIAGNOSIS — N2581 Secondary hyperparathyroidism of renal origin: Secondary | ICD-10-CM | POA: Diagnosis not present

## 2021-09-29 DIAGNOSIS — N186 End stage renal disease: Secondary | ICD-10-CM | POA: Diagnosis not present

## 2021-09-29 DIAGNOSIS — N2581 Secondary hyperparathyroidism of renal origin: Secondary | ICD-10-CM | POA: Diagnosis not present

## 2021-09-29 DIAGNOSIS — D509 Iron deficiency anemia, unspecified: Secondary | ICD-10-CM | POA: Diagnosis not present

## 2021-09-29 DIAGNOSIS — Z4932 Encounter for adequacy testing for peritoneal dialysis: Secondary | ICD-10-CM | POA: Diagnosis not present

## 2021-09-29 DIAGNOSIS — D631 Anemia in chronic kidney disease: Secondary | ICD-10-CM | POA: Diagnosis not present

## 2021-09-29 DIAGNOSIS — N2589 Other disorders resulting from impaired renal tubular function: Secondary | ICD-10-CM | POA: Diagnosis not present

## 2021-09-29 DIAGNOSIS — Z23 Encounter for immunization: Secondary | ICD-10-CM | POA: Diagnosis not present

## 2021-09-29 DIAGNOSIS — Z992 Dependence on renal dialysis: Secondary | ICD-10-CM | POA: Diagnosis not present

## 2021-09-29 DIAGNOSIS — K769 Liver disease, unspecified: Secondary | ICD-10-CM | POA: Diagnosis not present

## 2021-09-29 DIAGNOSIS — R82998 Other abnormal findings in urine: Secondary | ICD-10-CM | POA: Diagnosis not present

## 2021-09-30 DIAGNOSIS — N2581 Secondary hyperparathyroidism of renal origin: Secondary | ICD-10-CM | POA: Diagnosis not present

## 2021-09-30 DIAGNOSIS — Z4932 Encounter for adequacy testing for peritoneal dialysis: Secondary | ICD-10-CM | POA: Diagnosis not present

## 2021-09-30 DIAGNOSIS — Z23 Encounter for immunization: Secondary | ICD-10-CM | POA: Diagnosis not present

## 2021-09-30 DIAGNOSIS — D509 Iron deficiency anemia, unspecified: Secondary | ICD-10-CM | POA: Diagnosis not present

## 2021-09-30 DIAGNOSIS — N186 End stage renal disease: Secondary | ICD-10-CM | POA: Diagnosis not present

## 2021-09-30 DIAGNOSIS — Z992 Dependence on renal dialysis: Secondary | ICD-10-CM | POA: Diagnosis not present

## 2021-09-30 DIAGNOSIS — R82998 Other abnormal findings in urine: Secondary | ICD-10-CM | POA: Diagnosis not present

## 2021-09-30 DIAGNOSIS — N2589 Other disorders resulting from impaired renal tubular function: Secondary | ICD-10-CM | POA: Diagnosis not present

## 2021-09-30 DIAGNOSIS — D631 Anemia in chronic kidney disease: Secondary | ICD-10-CM | POA: Diagnosis not present

## 2021-09-30 DIAGNOSIS — K769 Liver disease, unspecified: Secondary | ICD-10-CM | POA: Diagnosis not present

## 2021-10-01 DIAGNOSIS — D631 Anemia in chronic kidney disease: Secondary | ICD-10-CM | POA: Diagnosis not present

## 2021-10-01 DIAGNOSIS — K769 Liver disease, unspecified: Secondary | ICD-10-CM | POA: Diagnosis not present

## 2021-10-01 DIAGNOSIS — R82998 Other abnormal findings in urine: Secondary | ICD-10-CM | POA: Diagnosis not present

## 2021-10-01 DIAGNOSIS — N2581 Secondary hyperparathyroidism of renal origin: Secondary | ICD-10-CM | POA: Diagnosis not present

## 2021-10-01 DIAGNOSIS — N2589 Other disorders resulting from impaired renal tubular function: Secondary | ICD-10-CM | POA: Diagnosis not present

## 2021-10-01 DIAGNOSIS — Z992 Dependence on renal dialysis: Secondary | ICD-10-CM | POA: Diagnosis not present

## 2021-10-01 DIAGNOSIS — Z4932 Encounter for adequacy testing for peritoneal dialysis: Secondary | ICD-10-CM | POA: Diagnosis not present

## 2021-10-01 DIAGNOSIS — Z23 Encounter for immunization: Secondary | ICD-10-CM | POA: Diagnosis not present

## 2021-10-01 DIAGNOSIS — D509 Iron deficiency anemia, unspecified: Secondary | ICD-10-CM | POA: Diagnosis not present

## 2021-10-01 DIAGNOSIS — N186 End stage renal disease: Secondary | ICD-10-CM | POA: Diagnosis not present

## 2021-10-02 ENCOUNTER — Telehealth: Payer: Self-pay | Admitting: Internal Medicine

## 2021-10-02 DIAGNOSIS — D509 Iron deficiency anemia, unspecified: Secondary | ICD-10-CM | POA: Diagnosis not present

## 2021-10-02 DIAGNOSIS — K769 Liver disease, unspecified: Secondary | ICD-10-CM | POA: Diagnosis not present

## 2021-10-02 DIAGNOSIS — N2581 Secondary hyperparathyroidism of renal origin: Secondary | ICD-10-CM | POA: Diagnosis not present

## 2021-10-02 DIAGNOSIS — R82998 Other abnormal findings in urine: Secondary | ICD-10-CM | POA: Diagnosis not present

## 2021-10-02 DIAGNOSIS — N186 End stage renal disease: Secondary | ICD-10-CM | POA: Diagnosis not present

## 2021-10-02 DIAGNOSIS — Z4932 Encounter for adequacy testing for peritoneal dialysis: Secondary | ICD-10-CM | POA: Diagnosis not present

## 2021-10-02 DIAGNOSIS — Z992 Dependence on renal dialysis: Secondary | ICD-10-CM | POA: Diagnosis not present

## 2021-10-02 DIAGNOSIS — Z23 Encounter for immunization: Secondary | ICD-10-CM | POA: Diagnosis not present

## 2021-10-02 DIAGNOSIS — N2589 Other disorders resulting from impaired renal tubular function: Secondary | ICD-10-CM | POA: Diagnosis not present

## 2021-10-02 DIAGNOSIS — D631 Anemia in chronic kidney disease: Secondary | ICD-10-CM | POA: Diagnosis not present

## 2021-10-02 NOTE — Telephone Encounter (Signed)
I called the patient to verify that he wants to talk about physical therapy. He reports that he has a visit recently with Dr. Melvyn Novas and is following up the end of the month and declined a follow up.   The patient is wanting to know if you would recommend him starting physical therapy. He declined any symptoms today but sounded like he was wheezing on the phone.  Please advise on physical therapy.

## 2021-10-03 DIAGNOSIS — Z4932 Encounter for adequacy testing for peritoneal dialysis: Secondary | ICD-10-CM | POA: Diagnosis not present

## 2021-10-03 DIAGNOSIS — K769 Liver disease, unspecified: Secondary | ICD-10-CM | POA: Diagnosis not present

## 2021-10-03 DIAGNOSIS — D509 Iron deficiency anemia, unspecified: Secondary | ICD-10-CM | POA: Diagnosis not present

## 2021-10-03 DIAGNOSIS — Z992 Dependence on renal dialysis: Secondary | ICD-10-CM | POA: Diagnosis not present

## 2021-10-03 DIAGNOSIS — N2589 Other disorders resulting from impaired renal tubular function: Secondary | ICD-10-CM | POA: Diagnosis not present

## 2021-10-03 DIAGNOSIS — N186 End stage renal disease: Secondary | ICD-10-CM | POA: Diagnosis not present

## 2021-10-03 DIAGNOSIS — N2581 Secondary hyperparathyroidism of renal origin: Secondary | ICD-10-CM | POA: Diagnosis not present

## 2021-10-03 DIAGNOSIS — R82998 Other abnormal findings in urine: Secondary | ICD-10-CM | POA: Diagnosis not present

## 2021-10-03 DIAGNOSIS — Z23 Encounter for immunization: Secondary | ICD-10-CM | POA: Diagnosis not present

## 2021-10-03 DIAGNOSIS — D631 Anemia in chronic kidney disease: Secondary | ICD-10-CM | POA: Diagnosis not present

## 2021-10-04 DIAGNOSIS — N2589 Other disorders resulting from impaired renal tubular function: Secondary | ICD-10-CM | POA: Diagnosis not present

## 2021-10-04 DIAGNOSIS — Z4932 Encounter for adequacy testing for peritoneal dialysis: Secondary | ICD-10-CM | POA: Diagnosis not present

## 2021-10-04 DIAGNOSIS — D509 Iron deficiency anemia, unspecified: Secondary | ICD-10-CM | POA: Diagnosis not present

## 2021-10-04 DIAGNOSIS — K769 Liver disease, unspecified: Secondary | ICD-10-CM | POA: Diagnosis not present

## 2021-10-04 DIAGNOSIS — Z992 Dependence on renal dialysis: Secondary | ICD-10-CM | POA: Diagnosis not present

## 2021-10-04 DIAGNOSIS — Z23 Encounter for immunization: Secondary | ICD-10-CM | POA: Diagnosis not present

## 2021-10-04 DIAGNOSIS — N2581 Secondary hyperparathyroidism of renal origin: Secondary | ICD-10-CM | POA: Diagnosis not present

## 2021-10-04 DIAGNOSIS — D631 Anemia in chronic kidney disease: Secondary | ICD-10-CM | POA: Diagnosis not present

## 2021-10-04 DIAGNOSIS — N186 End stage renal disease: Secondary | ICD-10-CM | POA: Diagnosis not present

## 2021-10-04 DIAGNOSIS — R82998 Other abnormal findings in urine: Secondary | ICD-10-CM | POA: Diagnosis not present

## 2021-10-05 DIAGNOSIS — Z992 Dependence on renal dialysis: Secondary | ICD-10-CM | POA: Diagnosis not present

## 2021-10-05 DIAGNOSIS — N186 End stage renal disease: Secondary | ICD-10-CM | POA: Diagnosis not present

## 2021-10-05 DIAGNOSIS — N2581 Secondary hyperparathyroidism of renal origin: Secondary | ICD-10-CM | POA: Diagnosis not present

## 2021-10-05 DIAGNOSIS — Z23 Encounter for immunization: Secondary | ICD-10-CM | POA: Diagnosis not present

## 2021-10-05 DIAGNOSIS — N2589 Other disorders resulting from impaired renal tubular function: Secondary | ICD-10-CM | POA: Diagnosis not present

## 2021-10-05 DIAGNOSIS — D631 Anemia in chronic kidney disease: Secondary | ICD-10-CM | POA: Diagnosis not present

## 2021-10-05 DIAGNOSIS — K769 Liver disease, unspecified: Secondary | ICD-10-CM | POA: Diagnosis not present

## 2021-10-05 DIAGNOSIS — Z4932 Encounter for adequacy testing for peritoneal dialysis: Secondary | ICD-10-CM | POA: Diagnosis not present

## 2021-10-05 DIAGNOSIS — D509 Iron deficiency anemia, unspecified: Secondary | ICD-10-CM | POA: Diagnosis not present

## 2021-10-05 DIAGNOSIS — R82998 Other abnormal findings in urine: Secondary | ICD-10-CM | POA: Diagnosis not present

## 2021-10-05 NOTE — Telephone Encounter (Signed)
Left message for patient to call back  

## 2021-10-05 NOTE — Telephone Encounter (Signed)
[facility] for PT but if this is for general medical therapy ( not pulmonary rehab) it should be ideally handled by this PCP

## 2021-10-06 DIAGNOSIS — Z4932 Encounter for adequacy testing for peritoneal dialysis: Secondary | ICD-10-CM | POA: Diagnosis not present

## 2021-10-06 DIAGNOSIS — D631 Anemia in chronic kidney disease: Secondary | ICD-10-CM | POA: Diagnosis not present

## 2021-10-06 DIAGNOSIS — N186 End stage renal disease: Secondary | ICD-10-CM | POA: Diagnosis not present

## 2021-10-06 DIAGNOSIS — Z23 Encounter for immunization: Secondary | ICD-10-CM | POA: Diagnosis not present

## 2021-10-06 DIAGNOSIS — N2581 Secondary hyperparathyroidism of renal origin: Secondary | ICD-10-CM | POA: Diagnosis not present

## 2021-10-06 DIAGNOSIS — D509 Iron deficiency anemia, unspecified: Secondary | ICD-10-CM | POA: Diagnosis not present

## 2021-10-06 DIAGNOSIS — N2589 Other disorders resulting from impaired renal tubular function: Secondary | ICD-10-CM | POA: Diagnosis not present

## 2021-10-06 DIAGNOSIS — Z992 Dependence on renal dialysis: Secondary | ICD-10-CM | POA: Diagnosis not present

## 2021-10-06 DIAGNOSIS — R82998 Other abnormal findings in urine: Secondary | ICD-10-CM | POA: Diagnosis not present

## 2021-10-06 DIAGNOSIS — K769 Liver disease, unspecified: Secondary | ICD-10-CM | POA: Diagnosis not present

## 2021-10-06 NOTE — Telephone Encounter (Signed)
Spoke with the pt and notified of response per MW  He verbalized understanding and nothing further needed

## 2021-10-06 NOTE — Telephone Encounter (Signed)
Patient is returning phone call. Please call after 2:00 pm. Patient phone number is [phone].

## 2021-10-07 DIAGNOSIS — D509 Iron deficiency anemia, unspecified: Secondary | ICD-10-CM | POA: Diagnosis not present

## 2021-10-07 DIAGNOSIS — Z992 Dependence on renal dialysis: Secondary | ICD-10-CM | POA: Diagnosis not present

## 2021-10-07 DIAGNOSIS — D631 Anemia in chronic kidney disease: Secondary | ICD-10-CM | POA: Diagnosis not present

## 2021-10-07 DIAGNOSIS — R82998 Other abnormal findings in urine: Secondary | ICD-10-CM | POA: Diagnosis not present

## 2021-10-07 DIAGNOSIS — K769 Liver disease, unspecified: Secondary | ICD-10-CM | POA: Diagnosis not present

## 2021-10-07 DIAGNOSIS — Z4932 Encounter for adequacy testing for peritoneal dialysis: Secondary | ICD-10-CM | POA: Diagnosis not present

## 2021-10-07 DIAGNOSIS — Z23 Encounter for immunization: Secondary | ICD-10-CM | POA: Diagnosis not present

## 2021-10-07 DIAGNOSIS — N186 End stage renal disease: Secondary | ICD-10-CM | POA: Diagnosis not present

## 2021-10-07 DIAGNOSIS — N2589 Other disorders resulting from impaired renal tubular function: Secondary | ICD-10-CM | POA: Diagnosis not present

## 2021-10-07 DIAGNOSIS — N2581 Secondary hyperparathyroidism of renal origin: Secondary | ICD-10-CM | POA: Diagnosis not present

## 2021-10-08 DIAGNOSIS — D631 Anemia in chronic kidney disease: Secondary | ICD-10-CM | POA: Diagnosis not present

## 2021-10-08 DIAGNOSIS — Z4932 Encounter for adequacy testing for peritoneal dialysis: Secondary | ICD-10-CM | POA: Diagnosis not present

## 2021-10-08 DIAGNOSIS — Z992 Dependence on renal dialysis: Secondary | ICD-10-CM | POA: Diagnosis not present

## 2021-10-08 DIAGNOSIS — Z23 Encounter for immunization: Secondary | ICD-10-CM | POA: Diagnosis not present

## 2021-10-08 DIAGNOSIS — R82998 Other abnormal findings in urine: Secondary | ICD-10-CM | POA: Diagnosis not present

## 2021-10-08 DIAGNOSIS — N2589 Other disorders resulting from impaired renal tubular function: Secondary | ICD-10-CM | POA: Diagnosis not present

## 2021-10-08 DIAGNOSIS — N2581 Secondary hyperparathyroidism of renal origin: Secondary | ICD-10-CM | POA: Diagnosis not present

## 2021-10-08 DIAGNOSIS — D509 Iron deficiency anemia, unspecified: Secondary | ICD-10-CM | POA: Diagnosis not present

## 2021-10-08 DIAGNOSIS — K769 Liver disease, unspecified: Secondary | ICD-10-CM | POA: Diagnosis not present

## 2021-10-08 DIAGNOSIS — N186 End stage renal disease: Secondary | ICD-10-CM | POA: Diagnosis not present

## 2021-10-09 ENCOUNTER — Ambulatory Visit (INDEPENDENT_AMBULATORY_CARE_PROVIDER_SITE_OTHER): Payer: Medicare Other | Admitting: Internal Medicine

## 2021-10-09 ENCOUNTER — Ambulatory Visit (INDEPENDENT_AMBULATORY_CARE_PROVIDER_SITE_OTHER): Payer: Medicare Other

## 2021-10-09 ENCOUNTER — Encounter: Payer: Self-pay | Admitting: Internal Medicine

## 2021-10-09 ENCOUNTER — Other Ambulatory Visit: Payer: Self-pay

## 2021-10-09 DIAGNOSIS — J9612 Chronic respiratory failure with hypercapnia: Secondary | ICD-10-CM

## 2021-10-09 DIAGNOSIS — Z23 Encounter for immunization: Secondary | ICD-10-CM | POA: Diagnosis not present

## 2021-10-09 DIAGNOSIS — N2589 Other disorders resulting from impaired renal tubular function: Secondary | ICD-10-CM | POA: Diagnosis not present

## 2021-10-09 DIAGNOSIS — J9611 Chronic respiratory failure with hypoxia: Secondary | ICD-10-CM

## 2021-10-09 DIAGNOSIS — R0609 Other forms of dyspnea: Secondary | ICD-10-CM

## 2021-10-09 DIAGNOSIS — J449 Chronic obstructive pulmonary disease, unspecified: Secondary | ICD-10-CM | POA: Diagnosis not present

## 2021-10-09 DIAGNOSIS — Z992 Dependence on renal dialysis: Secondary | ICD-10-CM | POA: Diagnosis not present

## 2021-10-09 DIAGNOSIS — J90 Pleural effusion, not elsewhere classified: Secondary | ICD-10-CM | POA: Diagnosis not present

## 2021-10-09 DIAGNOSIS — R82998 Other abnormal findings in urine: Secondary | ICD-10-CM | POA: Diagnosis not present

## 2021-10-09 DIAGNOSIS — R0600 Dyspnea, unspecified: Secondary | ICD-10-CM | POA: Diagnosis not present

## 2021-10-09 DIAGNOSIS — Z4932 Encounter for adequacy testing for peritoneal dialysis: Secondary | ICD-10-CM | POA: Diagnosis not present

## 2021-10-09 DIAGNOSIS — N2581 Secondary hyperparathyroidism of renal origin: Secondary | ICD-10-CM | POA: Diagnosis not present

## 2021-10-09 DIAGNOSIS — D631 Anemia in chronic kidney disease: Secondary | ICD-10-CM | POA: Diagnosis not present

## 2021-10-09 DIAGNOSIS — D509 Iron deficiency anemia, unspecified: Secondary | ICD-10-CM | POA: Diagnosis not present

## 2021-10-09 DIAGNOSIS — N186 End stage renal disease: Secondary | ICD-10-CM | POA: Diagnosis not present

## 2021-10-09 DIAGNOSIS — K769 Liver disease, unspecified: Secondary | ICD-10-CM | POA: Diagnosis not present

## 2021-10-09 NOTE — Progress Notes (Signed)
David Barton, male    DOB: Apr 12, 1933    MRN: [id number]   Brief patient profile:  20  yowm  MM/quit smoking 1983  At wt 180 with baseline able to walk the dog 10 minutes slow pace but  avoided steps maint on spiriva with GOLD II COPD criteria 07/16/13     History of Present Illness  04/24/2019  Pulmonary/  office eval/David Barton / 02 4lpm 24/7 / persistent afib and started amiodarone since last ov Chief Complaint  Patient presents with   Pulmonary Consult    Referred by Kerin Ransom, PA. Pt c/o SOB since March 2020. Pt c/o DOE with walking from room to room.  He has had some cough with clear to bloody sputum.     Dyspnea:  Gradually losing ground now Room to room gives out due to sob even on 4lpm  Cough: more bloody/ no pain with cough Sleep: on flat bed, 3 pillows on back SABA HQP:[id number] helps the most  rec Stop spiriva and take the  duoneb up to 4 x in 24 hours as you feel you need it if it helps your shortness of breath For cough/congestion >  mucinex or mucinex dm up to 1200 mg every 12 hours and use flutter valve  as much as you can  Humidify 02 as much of the time as your can    televisit  12/30/20 For cough / congestion >>>  mucinex dm 1200 mg every 12 hours as needed  (2 x 600 every 12 hours)  Plan A = Automatic = Always=    Continue trelegy one click  Plan B = Backup (to supplement plan A, not to replace it) Only use your albuterol inhaler as a rescue medication Also ok to try albuterol 15 min before an activity   01/19/2021  f/u ov/David Barton re:  GOLD II/ did not bring meds, easily confused with details of care. maint on trelegy but wrose x one month /sob  Chief Complaint  Patient presents with   Follow-up    Sob-worse,cough-clear  Dyspnea:  Walking around the house sev times wears him out, has not tried saba first as rec  Cough: mucus  Sleeping: cough worse at hs at 30 degrees electric   SABA use: none  02: none  rec For cough / congestion >>>  mucinex dm 1200 mg every 12  hours as needed  (2 x 600 every 12 hours)  And use the flutter valve as much as possible  Make sure you check your oxygen saturation at your highest level of activity  Pantoprazole (protonix) 40 mg   Take  30-60 min before first meal of the day and Pepcid (famotidine)  20 mg one an hour before   bedtime until return to office - this is the best way to tell whether stomach acid is contributing to your problem.   GERD diet Prednisone 10 mg take  4 each am x 2 days,   2 each am x 2 days,  1 each am x 2 days and stop    02/18/2021  f/u ov/David Barton re:  GOLD II / no meds with trelegy only  Chief Complaint  Patient presents with   Follow-up    Sob with exertion, dry cough last night  Dyspnea:  Walk 5 min flat and could do more/ starting to do treadmill  Cough: better and not using mucinex dm  Sleeping: 30 degrees electric bed/ on PD nightly  SABA use: none  02: none  Covid status:  moderna x 3  Rec Continue same medications which should include pepcid 20 mg (famotidine)  at least an hour before bed.  Please schedule a follow up visit in 6 months but call sooner if needed  with all medications /inhalers/ solutions in hand   09/23/2021  f/u ov/David Barton re: GOLD 2 copd   maint on trelegy / qhs HD   Chief Complaint  Patient presents with   Follow-up    Has had a productive cough for 4 months, mucus is clear. SOB has been the same. Patient states having a hard time walking.   Dyspnea:  walking up landing and in house gives out, not checking sats  Cough: improving but still daytime congestion, no worse at hs and no excess or purulent sputum Sleeping: almost flat / one pillow  SABA use: not using  02: none  Covid status:   vax x 3  HR up since says nehrology d/c'd lopressor  Rec No change medications  Make sure you check your oxygen saturation at your highest level of activity to be sure it stays over 90% and keep track of it at least once a week, more often if breathing getting worse, and let me know  if losing ground.  Please schedule a follow up visit in 6 months but call sooner if needed     Late add:  ? RUL pna / R effusion rec levaquin 500 x 7days and repeat ov with cxr in 14 days     10/09/2021  f/u ov/David Barton re: copd 3/ R effusion    maint on Trelegy   Chief Complaint  Patient presents with   Follow-up    Patient reports increased shortness of breath with exertion.   Dyspnea:  really about the same = MMRC3 = can't walk 100 yards even at a slow pace at a flat grade s stopping due to sob   Cough: minimal mucoid Sleeping: 30 degrees hob  new x several  SABA use: none 02: just when walking at home 2lpm /sats 94% feels better when wears it for sure      No obvious day to day or daytime variability or assoc excess/ purulent sputum or mucus plugs or hemoptysis or cp or chest tightness, subjective wheeze or overt sinus or hb symptoms.   sleeping without nocturnal  or early am exacerbation  of respiratory  c/o's or need for noct saba. Also denies any obvious fluctuation of symptoms with weather or environmental changes or other aggravating or alleviating factors except as outlined above   No unusual exposure hx or h/o childhood pna/ asthma or knowledge of premature birth.  Current Allergies, Complete Past Medical History, Past Surgical History, Family History, and Social History were reviewed in [employer] record.  ROS  The following are not active complaints unless bolded Hoarseness, sore throat, dysphagia, dental problems, itching, sneezing,  nasal congestion or discharge of excess mucus or purulent secretions, ear ache,   fever, chills, sweats, unintended wt loss or wt gain, classically pleuritic or exertional cp,  orthopnea pnd or arm/hand swelling  or leg swelling, presyncope, palpitations, abdominal pain, anorexia, nausea, vomiting, diarrhea  or change in bowel habits or change in bladder habits, change in stools or change in urine, dysuria, hematuria,  rash,  arthralgias, visual complaints, headache, numbness, weakness or ataxia or problems with walking or coordination,  change in mood or  memory.        Current Meds  Medication Sig  albuterol (PROAIR HFA) 108 (90 Base) MCG/ACT inhaler 2 puffs every 4 hours as needed only  if your can't catch your breath (Patient taking differently: 2 puffs every 4 hours as needed only  if your can't catch your breath)   apixaban (ELIQUIS) 2.5 MG TABS tablet TAKE 1 TABLET(2.5 MG) BY MOUTH TWICE DAILY   famotidine (PEPCID) 20 MG tablet One after supper   multivitamin (RENA-VIT) TABS tablet Take 1 tablet by mouth at bedtime.   pantoprazole (PROTONIX) 40 MG tablet Take 1 tablet (40 mg total) by mouth daily. Take 30-60 min before first meal of the day   sevelamer carbonate (RENVELA) 800 MG tablet Take 1 tablet (800 mg total) by mouth 3 (three) times daily with meals.   TRELEGY ELLIPTA 100-62.5-25 MCG/INH AEPB INHALE 1 PUFF INTO THE LUNGS DAILY   zolpidem (AMBIEN) 10 MG tablet Take 10 mg by mouth at bedtime as needed.                      Objective:    10/09/2021       190  09/23/2021        188  02/18/2021          183  01/19/2021        183  09/17/2020        184  11/05/2019      190   08/03/2019       195   06/27/2019         208   04/24/19 231 lb (104.8 kg)  04/17/19 225 lb (102.1 kg)  03/26/19 226 lb 3.1 oz (102.6 kg)   Vital signs reviewed  10/09/2021  - Note at rest 02 sats  94% on RA   General appearance:    chronically ill amb elderly wm, very hard of hearing    HEENT : pt wearing mask not removed for exam due to covid - 19 concerns.    NECK :  without JVD/Nodes/TM/ nl carotid upstrokes bilaterally   LUNGS: no acc muscle use,  Mild barrel  contour chest wall with bilateral  Distant bs s audible wheeze with dullness R base  and  without cough on insp or exp maneuvers  and mild  Hyperresonant  to  percussion bilaterally     CV:  RRR  no s3 or murmur or increase in P2, and no edema   ABD:   soft and nontender with pos end  insp Hoover's  in the supine position. No bruits or organomegaly appreciated, bowel sounds nl  MS:   Nl gait/  ext warm without deformities, calf tenderness, cyanosis or clubbing No obvious joint restrictions   SKIN: warm and dry without lesions    NEURO:  alert, approp, nl sensorium with  no motor or cerebellar deficits apparent.          CXR PA and Lateral:   10/09/2021 :    I personally reviewed images and agree with radiology impression as follows:   No change moderate R effusion    Assessment

## 2021-10-09 NOTE — Patient Instructions (Signed)
We will call to schedule you a thoracentesis ultrasound guided on Right side and I will call with the results

## 2021-10-10 ENCOUNTER — Encounter: Payer: Self-pay | Admitting: Internal Medicine

## 2021-10-10 DIAGNOSIS — N2589 Other disorders resulting from impaired renal tubular function: Secondary | ICD-10-CM | POA: Diagnosis not present

## 2021-10-10 DIAGNOSIS — D631 Anemia in chronic kidney disease: Secondary | ICD-10-CM | POA: Diagnosis not present

## 2021-10-10 DIAGNOSIS — N186 End stage renal disease: Secondary | ICD-10-CM | POA: Diagnosis not present

## 2021-10-10 DIAGNOSIS — D509 Iron deficiency anemia, unspecified: Secondary | ICD-10-CM | POA: Diagnosis not present

## 2021-10-10 DIAGNOSIS — R82998 Other abnormal findings in urine: Secondary | ICD-10-CM | POA: Diagnosis not present

## 2021-10-10 DIAGNOSIS — Z992 Dependence on renal dialysis: Secondary | ICD-10-CM | POA: Diagnosis not present

## 2021-10-10 DIAGNOSIS — N2581 Secondary hyperparathyroidism of renal origin: Secondary | ICD-10-CM | POA: Diagnosis not present

## 2021-10-10 DIAGNOSIS — Z4932 Encounter for adequacy testing for peritoneal dialysis: Secondary | ICD-10-CM | POA: Diagnosis not present

## 2021-10-10 DIAGNOSIS — K769 Liver disease, unspecified: Secondary | ICD-10-CM | POA: Diagnosis not present

## 2021-10-10 DIAGNOSIS — Z23 Encounter for immunization: Secondary | ICD-10-CM | POA: Diagnosis not present

## 2021-10-10 NOTE — Assessment & Plan Note (Signed)
Onset 02/2019 secondary to cap, chf/ copd HC03 05/04/2019 = 32  HCO3  06/14/19   = 25  - Pt d/c'd 02 on his own end of Oct 2020 and again 12/26/2019  - 02/18/2021   Walked on RA x one lap =  approx 250 ft -@ slow pace stopped due to legs tired/hurt    But no sob and sats 100% at end  09/23/2021  Patient Saturations on Room Air at Rest = 91% [employer] while Ambulating = 88% >>>  2 Liters of oxygen while Ambulating = 91%  Again advised: Make sure you check your oxygen saturation  at your highest level of activity  to be sure it stays over 90% and adjust  02 flow upward to maintain this level if needed but remember to turn it back to previous settings when you stop (to conserve your supply).

## 2021-10-10 NOTE — Assessment & Plan Note (Signed)
Quit smoking 1983  - PFTs 07/16/2013  FEV1  1.75 (61%) ratio 52 and 15% better p B2, DLCO 74% - 06/27/2019  After extensive coaching inhaler device,  effectiveness =    90% with dpi so changed to trelegy maint and proair respiclick> improved as of ov 08/03/2019  -Labs  01/19/2021  :  allergy profile   alpha one AT phenotype  MM level 279  - Allergy profile 01/19/21  >  Eos 0.4 /  IgE  10    Group D in terms of symptom/risk and laba/lama/ICS  therefore appropriate rx at this point >>>  Continue trelegy and prn saba

## 2021-10-10 NOTE — Addendum Note (Signed)
Addended by: Christinia Gully B on: 10/10/2021 02:33 PM   Modules accepted: Orders

## 2021-10-10 NOTE — Assessment & Plan Note (Signed)
R [facility]acen[place]is 05/21/19   =  1.85 l slt bloody exudative, nl glucose, nl cells on path review Repeat 06/02/19 x 1.4 liters Repeat 6/21/0  x 2.3 liters dark red  cyt neg  11/05/2019 resolved as of 11/05/2019 p  HD 11/03/2019  - 09/17/2020 minimal decreased aeration R base s obvious pl effusion  - 12/30/2020 cxr slt wet appearing cxr ? Pre or post HD with no def effusions  - 10/09/2021 rec re tap R effusion   NB Note that pleural effusion and copd have the same effect on insp muscles/mechanics (both shorten their length prior to inspiration making them weaker with less force reserve) so they are synergistic in causing sob.   >>> therapeutic/ diagnostic R tap indicated esp since no evidence of vol overload.  Discussed in detail all the  indications, usual  risks and alternatives  relative to the benefits with patient who agrees to proceed with w/u as outlined.     Medical decision making was a high level of complexity in this case because of a chronic condition/diagnosis with persistence of refractory R effusion   requiring extra time for  H and P, chart review, counseling,  and generating customized AVS unique to this office visit and charting.   Each maintenance medication was reviewed in detail including emphasizing most importantly the difference between maintenance and prns and under what circumstances the prns are to be triggered using an action plan format where appropriate. Please see avs for details which were reviewed in writing by both me and my nurse and patient given a written copy highlighted where appropriate with yellow highlighter for the patient's continued care at home along with an updated version of their medications.  Patient was asked to maintain medication reconciliation by comparing this list to the actual medications being used at home and to contact this office right away if there is a conflict or discrepancy.

## 2021-10-11 DIAGNOSIS — Z4932 Encounter for adequacy testing for peritoneal dialysis: Secondary | ICD-10-CM | POA: Diagnosis not present

## 2021-10-11 DIAGNOSIS — K769 Liver disease, unspecified: Secondary | ICD-10-CM | POA: Diagnosis not present

## 2021-10-11 DIAGNOSIS — Z23 Encounter for immunization: Secondary | ICD-10-CM | POA: Diagnosis not present

## 2021-10-11 DIAGNOSIS — N2589 Other disorders resulting from impaired renal tubular function: Secondary | ICD-10-CM | POA: Diagnosis not present

## 2021-10-11 DIAGNOSIS — R82998 Other abnormal findings in urine: Secondary | ICD-10-CM | POA: Diagnosis not present

## 2021-10-11 DIAGNOSIS — D509 Iron deficiency anemia, unspecified: Secondary | ICD-10-CM | POA: Diagnosis not present

## 2021-10-11 DIAGNOSIS — N186 End stage renal disease: Secondary | ICD-10-CM | POA: Diagnosis not present

## 2021-10-11 DIAGNOSIS — N2581 Secondary hyperparathyroidism of renal origin: Secondary | ICD-10-CM | POA: Diagnosis not present

## 2021-10-11 DIAGNOSIS — D631 Anemia in chronic kidney disease: Secondary | ICD-10-CM | POA: Diagnosis not present

## 2021-10-11 DIAGNOSIS — Z992 Dependence on renal dialysis: Secondary | ICD-10-CM | POA: Diagnosis not present

## 2021-10-12 ENCOUNTER — Telehealth: Payer: Self-pay | Admitting: Internal Medicine

## 2021-10-12 DIAGNOSIS — K769 Liver disease, unspecified: Secondary | ICD-10-CM | POA: Diagnosis not present

## 2021-10-12 DIAGNOSIS — Z992 Dependence on renal dialysis: Secondary | ICD-10-CM | POA: Diagnosis not present

## 2021-10-12 DIAGNOSIS — N2581 Secondary hyperparathyroidism of renal origin: Secondary | ICD-10-CM | POA: Diagnosis not present

## 2021-10-12 DIAGNOSIS — Z4932 Encounter for adequacy testing for peritoneal dialysis: Secondary | ICD-10-CM | POA: Diagnosis not present

## 2021-10-12 DIAGNOSIS — D509 Iron deficiency anemia, unspecified: Secondary | ICD-10-CM | POA: Diagnosis not present

## 2021-10-12 DIAGNOSIS — Z23 Encounter for immunization: Secondary | ICD-10-CM | POA: Diagnosis not present

## 2021-10-12 DIAGNOSIS — N2589 Other disorders resulting from impaired renal tubular function: Secondary | ICD-10-CM | POA: Diagnosis not present

## 2021-10-12 DIAGNOSIS — D631 Anemia in chronic kidney disease: Secondary | ICD-10-CM | POA: Diagnosis not present

## 2021-10-12 DIAGNOSIS — N186 End stage renal disease: Secondary | ICD-10-CM | POA: Diagnosis not present

## 2021-10-12 DIAGNOSIS — R82998 Other abnormal findings in urine: Secondary | ICD-10-CM | POA: Diagnosis not present

## 2021-10-12 NOTE — Telephone Encounter (Signed)
Sorry yes that's right he should skip 3 doses prior to the procedure

## 2021-10-12 NOTE — Telephone Encounter (Signed)
I called the patient to verify that he has not stopped taking the Eliquis and wants to make sure that he would not need to re-schedule for his Thoracentesis since he was told he may have to come off the Eliquis before his procedure.   Is he ok to go ahead with the Thoracentesis tomorrow or do we need to re-schedule? Please advise.

## 2021-10-12 NOTE — Telephone Encounter (Signed)
I rescheduled pt to 10/28 at 9:00, arrive 8:45.  Called pt to give him appt info.  He states that is too early & he has visitors coming Friday - wants to change to Monday.  I gave him the phone # to [employer] and he is going to change to a date and time that suites him.  He is aware he is not to take Eliquis for 3 days prior.  Nothing further needed.

## 2021-10-12 NOTE — Telephone Encounter (Signed)
I called the patient per Dr. Melvyn Novas recommendations and he is aware to stop the next 3 doses prior to the procedure and he did not have any questions.   Dr. Melvyn Novas wants to re-schedule the patient for another day and the patient is aware to stop his Eliquis for the next 3 doses. Can we get him re-scheduled? Thanks

## 2021-10-13 ENCOUNTER — Other Ambulatory Visit (HOSPITAL_COMMUNITY): Payer: Medicare Other

## 2021-10-13 DIAGNOSIS — Z4932 Encounter for adequacy testing for peritoneal dialysis: Secondary | ICD-10-CM | POA: Diagnosis not present

## 2021-10-13 DIAGNOSIS — D631 Anemia in chronic kidney disease: Secondary | ICD-10-CM | POA: Diagnosis not present

## 2021-10-13 DIAGNOSIS — N2589 Other disorders resulting from impaired renal tubular function: Secondary | ICD-10-CM | POA: Diagnosis not present

## 2021-10-13 DIAGNOSIS — Z992 Dependence on renal dialysis: Secondary | ICD-10-CM | POA: Diagnosis not present

## 2021-10-13 DIAGNOSIS — N186 End stage renal disease: Secondary | ICD-10-CM | POA: Diagnosis not present

## 2021-10-13 DIAGNOSIS — R82998 Other abnormal findings in urine: Secondary | ICD-10-CM | POA: Diagnosis not present

## 2021-10-13 DIAGNOSIS — N2581 Secondary hyperparathyroidism of renal origin: Secondary | ICD-10-CM | POA: Diagnosis not present

## 2021-10-13 DIAGNOSIS — D509 Iron deficiency anemia, unspecified: Secondary | ICD-10-CM | POA: Diagnosis not present

## 2021-10-13 DIAGNOSIS — K769 Liver disease, unspecified: Secondary | ICD-10-CM | POA: Diagnosis not present

## 2021-10-13 DIAGNOSIS — Z23 Encounter for immunization: Secondary | ICD-10-CM | POA: Diagnosis not present

## 2021-10-14 ENCOUNTER — Telehealth: Payer: Self-pay | Admitting: Internal Medicine

## 2021-10-14 DIAGNOSIS — N186 End stage renal disease: Secondary | ICD-10-CM | POA: Diagnosis not present

## 2021-10-14 DIAGNOSIS — Z4932 Encounter for adequacy testing for peritoneal dialysis: Secondary | ICD-10-CM | POA: Diagnosis not present

## 2021-10-14 DIAGNOSIS — R82998 Other abnormal findings in urine: Secondary | ICD-10-CM | POA: Diagnosis not present

## 2021-10-14 DIAGNOSIS — N2581 Secondary hyperparathyroidism of renal origin: Secondary | ICD-10-CM | POA: Diagnosis not present

## 2021-10-14 DIAGNOSIS — K769 Liver disease, unspecified: Secondary | ICD-10-CM | POA: Diagnosis not present

## 2021-10-14 DIAGNOSIS — J449 Chronic obstructive pulmonary disease, unspecified: Secondary | ICD-10-CM

## 2021-10-14 DIAGNOSIS — D509 Iron deficiency anemia, unspecified: Secondary | ICD-10-CM | POA: Diagnosis not present

## 2021-10-14 DIAGNOSIS — N2589 Other disorders resulting from impaired renal tubular function: Secondary | ICD-10-CM | POA: Diagnosis not present

## 2021-10-14 DIAGNOSIS — Z23 Encounter for immunization: Secondary | ICD-10-CM | POA: Diagnosis not present

## 2021-10-14 DIAGNOSIS — D631 Anemia in chronic kidney disease: Secondary | ICD-10-CM | POA: Diagnosis not present

## 2021-10-14 DIAGNOSIS — Z992 Dependence on renal dialysis: Secondary | ICD-10-CM | POA: Diagnosis not present

## 2021-10-14 NOTE — Telephone Encounter (Signed)
Called and spoke David Barton. She stated that the patient informed the dialysis center that he would be having a thora on Monday. She wanted to see if the pleural fluid could be tested for glucose. I advised her that glucose is one of the standards for the fluid. She verbalized understanding.   Nothing further needed.

## 2021-10-14 NOTE — Telephone Encounter (Signed)
Dr Melvyn Novas- pt says he checked with kidney specialist about home PT and he was told that you should make that call since pt is prescribed o2 from [place]. Please advise if you want [place] to order, thanks!

## 2021-10-15 DIAGNOSIS — Z4932 Encounter for adequacy testing for peritoneal dialysis: Secondary | ICD-10-CM | POA: Diagnosis not present

## 2021-10-15 DIAGNOSIS — Z23 Encounter for immunization: Secondary | ICD-10-CM | POA: Diagnosis not present

## 2021-10-15 DIAGNOSIS — D509 Iron deficiency anemia, unspecified: Secondary | ICD-10-CM | POA: Diagnosis not present

## 2021-10-15 DIAGNOSIS — K769 Liver disease, unspecified: Secondary | ICD-10-CM | POA: Diagnosis not present

## 2021-10-15 DIAGNOSIS — N2581 Secondary hyperparathyroidism of renal origin: Secondary | ICD-10-CM | POA: Diagnosis not present

## 2021-10-15 DIAGNOSIS — N2589 Other disorders resulting from impaired renal tubular function: Secondary | ICD-10-CM | POA: Diagnosis not present

## 2021-10-15 DIAGNOSIS — N186 End stage renal disease: Secondary | ICD-10-CM | POA: Diagnosis not present

## 2021-10-15 DIAGNOSIS — Z992 Dependence on renal dialysis: Secondary | ICD-10-CM | POA: Diagnosis not present

## 2021-10-15 DIAGNOSIS — R82998 Other abnormal findings in urine: Secondary | ICD-10-CM | POA: Diagnosis not present

## 2021-10-15 DIAGNOSIS — D631 Anemia in chronic kidney disease: Secondary | ICD-10-CM | POA: Diagnosis not present

## 2021-10-15 NOTE — Telephone Encounter (Signed)
I called and spoke with the pt and notified of response per MW  He verbalized understanding  Nothing further needed

## 2021-10-15 NOTE — Telephone Encounter (Signed)
See if we can get him home resp rehab  and if not then standard PT is ok for new dx of exertional hypoxemia

## 2021-10-15 NOTE — Telephone Encounter (Signed)
They will assess how much fluid is present prior to the procedure ( if there isnn't much they won't do it)  so no need to do a cxr  prior - they will do one after anyway

## 2021-10-15 NOTE — Telephone Encounter (Signed)
Pt is aware and rehab order placed   Pt states since kidney doc increased dextrose past 4 days he has lost a good amount of fluid  He is scheduled for thora by IR on 10/31 and wants to know if he should have cxr done prior  I was unsure if this was part of the protocol, but no order for cxr was placed  Please advise thanks!

## 2021-10-16 ENCOUNTER — Ambulatory Visit (HOSPITAL_COMMUNITY): Payer: Medicare Other

## 2021-10-16 DIAGNOSIS — Z992 Dependence on renal dialysis: Secondary | ICD-10-CM | POA: Diagnosis not present

## 2021-10-16 DIAGNOSIS — D631 Anemia in chronic kidney disease: Secondary | ICD-10-CM | POA: Diagnosis not present

## 2021-10-16 DIAGNOSIS — N2589 Other disorders resulting from impaired renal tubular function: Secondary | ICD-10-CM | POA: Diagnosis not present

## 2021-10-16 DIAGNOSIS — D509 Iron deficiency anemia, unspecified: Secondary | ICD-10-CM | POA: Diagnosis not present

## 2021-10-16 DIAGNOSIS — Z4932 Encounter for adequacy testing for peritoneal dialysis: Secondary | ICD-10-CM | POA: Diagnosis not present

## 2021-10-16 DIAGNOSIS — N2581 Secondary hyperparathyroidism of renal origin: Secondary | ICD-10-CM | POA: Diagnosis not present

## 2021-10-16 DIAGNOSIS — K769 Liver disease, unspecified: Secondary | ICD-10-CM | POA: Diagnosis not present

## 2021-10-16 DIAGNOSIS — R82998 Other abnormal findings in urine: Secondary | ICD-10-CM | POA: Diagnosis not present

## 2021-10-16 DIAGNOSIS — N186 End stage renal disease: Secondary | ICD-10-CM | POA: Diagnosis not present

## 2021-10-16 DIAGNOSIS — Z23 Encounter for immunization: Secondary | ICD-10-CM | POA: Diagnosis not present

## 2021-10-17 DIAGNOSIS — N2581 Secondary hyperparathyroidism of renal origin: Secondary | ICD-10-CM | POA: Diagnosis not present

## 2021-10-17 DIAGNOSIS — Z4932 Encounter for adequacy testing for peritoneal dialysis: Secondary | ICD-10-CM | POA: Diagnosis not present

## 2021-10-17 DIAGNOSIS — D509 Iron deficiency anemia, unspecified: Secondary | ICD-10-CM | POA: Diagnosis not present

## 2021-10-17 DIAGNOSIS — N186 End stage renal disease: Secondary | ICD-10-CM | POA: Diagnosis not present

## 2021-10-17 DIAGNOSIS — Z992 Dependence on renal dialysis: Secondary | ICD-10-CM | POA: Diagnosis not present

## 2021-10-17 DIAGNOSIS — Z23 Encounter for immunization: Secondary | ICD-10-CM | POA: Diagnosis not present

## 2021-10-17 DIAGNOSIS — K769 Liver disease, unspecified: Secondary | ICD-10-CM | POA: Diagnosis not present

## 2021-10-17 DIAGNOSIS — R82998 Other abnormal findings in urine: Secondary | ICD-10-CM | POA: Diagnosis not present

## 2021-10-17 DIAGNOSIS — D631 Anemia in chronic kidney disease: Secondary | ICD-10-CM | POA: Diagnosis not present

## 2021-10-17 DIAGNOSIS — N2589 Other disorders resulting from impaired renal tubular function: Secondary | ICD-10-CM | POA: Diagnosis not present

## 2021-10-18 DIAGNOSIS — R82998 Other abnormal findings in urine: Secondary | ICD-10-CM | POA: Diagnosis not present

## 2021-10-18 DIAGNOSIS — Z4932 Encounter for adequacy testing for peritoneal dialysis: Secondary | ICD-10-CM | POA: Diagnosis not present

## 2021-10-18 DIAGNOSIS — N2589 Other disorders resulting from impaired renal tubular function: Secondary | ICD-10-CM | POA: Diagnosis not present

## 2021-10-18 DIAGNOSIS — K769 Liver disease, unspecified: Secondary | ICD-10-CM | POA: Diagnosis not present

## 2021-10-18 DIAGNOSIS — N186 End stage renal disease: Secondary | ICD-10-CM | POA: Diagnosis not present

## 2021-10-18 DIAGNOSIS — Z992 Dependence on renal dialysis: Secondary | ICD-10-CM | POA: Diagnosis not present

## 2021-10-18 DIAGNOSIS — N2581 Secondary hyperparathyroidism of renal origin: Secondary | ICD-10-CM | POA: Diagnosis not present

## 2021-10-18 DIAGNOSIS — D631 Anemia in chronic kidney disease: Secondary | ICD-10-CM | POA: Diagnosis not present

## 2021-10-18 DIAGNOSIS — Z23 Encounter for immunization: Secondary | ICD-10-CM | POA: Diagnosis not present

## 2021-10-18 DIAGNOSIS — D509 Iron deficiency anemia, unspecified: Secondary | ICD-10-CM | POA: Diagnosis not present

## 2021-10-19 ENCOUNTER — Ambulatory Visit (HOSPITAL_COMMUNITY)
Admission: RE | Admit: 2021-10-19 | Discharge: 2021-10-19 | Disposition: A | Discharge status: Home / Self Care | Payer: Medicare Other | Source: Ambulatory Visit | Attending: Internal Medicine | Admitting: Internal Medicine

## 2021-10-19 ENCOUNTER — Other Ambulatory Visit: Payer: Self-pay

## 2021-10-19 ENCOUNTER — Ambulatory Visit (HOSPITAL_COMMUNITY)
Admission: RE | Admit: 2021-10-19 | Discharge: 2021-10-19 | Disposition: A | Discharge status: Home / Self Care | Payer: Medicare Other | Source: Ambulatory Visit | Attending: Student | Admitting: Student

## 2021-10-19 DIAGNOSIS — N186 End stage renal disease: Secondary | ICD-10-CM | POA: Diagnosis not present

## 2021-10-19 DIAGNOSIS — D509 Iron deficiency anemia, unspecified: Secondary | ICD-10-CM | POA: Diagnosis not present

## 2021-10-19 DIAGNOSIS — R82998 Other abnormal findings in urine: Secondary | ICD-10-CM | POA: Diagnosis not present

## 2021-10-19 DIAGNOSIS — Z9889 Other specified postprocedural states: Secondary | ICD-10-CM

## 2021-10-19 DIAGNOSIS — Z992 Dependence on renal dialysis: Secondary | ICD-10-CM | POA: Diagnosis not present

## 2021-10-19 DIAGNOSIS — Z4932 Encounter for adequacy testing for peritoneal dialysis: Secondary | ICD-10-CM | POA: Diagnosis not present

## 2021-10-19 DIAGNOSIS — J984 Other disorders of lung: Secondary | ICD-10-CM | POA: Diagnosis not present

## 2021-10-19 DIAGNOSIS — I129 Hypertensive chronic kidney disease with stage 1 through stage 4 chronic kidney disease, or unspecified chronic kidney disease: Secondary | ICD-10-CM | POA: Diagnosis not present

## 2021-10-19 DIAGNOSIS — J90 Pleural effusion, not elsewhere classified: Secondary | ICD-10-CM | POA: Insufficient documentation

## 2021-10-19 DIAGNOSIS — K769 Liver disease, unspecified: Secondary | ICD-10-CM | POA: Diagnosis not present

## 2021-10-19 DIAGNOSIS — Z23 Encounter for immunization: Secondary | ICD-10-CM | POA: Diagnosis not present

## 2021-10-19 DIAGNOSIS — N2589 Other disorders resulting from impaired renal tubular function: Secondary | ICD-10-CM | POA: Diagnosis not present

## 2021-10-19 DIAGNOSIS — D631 Anemia in chronic kidney disease: Secondary | ICD-10-CM | POA: Diagnosis not present

## 2021-10-19 DIAGNOSIS — N2581 Secondary hyperparathyroidism of renal origin: Secondary | ICD-10-CM | POA: Diagnosis not present

## 2021-10-19 HISTORY — PX: IR THORACENTESIS ASP PLEURAL SPACE W/IMG GUIDE: IMG5380

## 2021-10-19 LAB — BODY FLUID CELL COUNT WITH DIFFERENTIAL
Eos, Fluid: 1 %
Lymphs, Fluid: 89 %
Monocyte-Macrophage-Serous Fluid: 3 % — ABNORMAL LOW (ref 50–90)
Neutrophil Count, Fluid: 7 % (ref 0–25)
Total Nucleated Cell Count, Fluid: 1314 cu mm — ABNORMAL HIGH (ref 0–1000)

## 2021-10-19 LAB — AMYLASE, PLEURAL OR PERITONEAL FLUID: Amylase, Fluid: 162 U/L

## 2021-10-19 LAB — LACTATE DEHYDROGENASE, PLEURAL OR PERITONEAL FLUID: LD, Fluid: 200 U/L — ABNORMAL HIGH (ref 3–23)

## 2021-10-19 LAB — GLUCOSE, PLEURAL OR PERITONEAL FLUID: Glucose, Fluid: 122 mg/dL

## 2021-10-19 LAB — PROTEIN, PLEURAL OR PERITONEAL FLUID: Total protein, fluid: 3.4 g/dL

## 2021-10-19 LAB — ALBUMIN, PLEURAL OR PERITONEAL FLUID: Albumin, Fluid: 1.7 g/dL

## 2021-10-19 MED ORDER — LIDOCAINE HCL 1 % IJ SOLN
INTRAMUSCULAR | Status: AC
  Administered 2021-10-19: 15 mL
  Filled 2021-10-19: qty 20

## 2021-10-19 NOTE — Procedures (Signed)
PROCEDURE SUMMARY:  Successful image-guided right thoracentesis. Yielded 650 cc of hazy amber, dark red colored fluid. Pt tolerated procedure well. No immediate complications. EBL = trace   Specimen was sent for labs. CXR ordered.  Please see imaging section of Epic for full dictation.  Armando Gang Lorenza Winkleman PA-C 10/19/2021 1:45 PM

## 2021-10-20 ENCOUNTER — Telehealth: Payer: Self-pay | Admitting: Internal Medicine

## 2021-10-20 DIAGNOSIS — Z9181 History of falling: Secondary | ICD-10-CM | POA: Diagnosis not present

## 2021-10-20 DIAGNOSIS — R82998 Other abnormal findings in urine: Secondary | ICD-10-CM | POA: Diagnosis not present

## 2021-10-20 DIAGNOSIS — J9611 Chronic respiratory failure with hypoxia: Secondary | ICD-10-CM | POA: Diagnosis not present

## 2021-10-20 DIAGNOSIS — Z79899 Other long term (current) drug therapy: Secondary | ICD-10-CM | POA: Diagnosis not present

## 2021-10-20 DIAGNOSIS — D631 Anemia in chronic kidney disease: Secondary | ICD-10-CM | POA: Diagnosis not present

## 2021-10-20 DIAGNOSIS — K769 Liver disease, unspecified: Secondary | ICD-10-CM | POA: Diagnosis not present

## 2021-10-20 DIAGNOSIS — Z992 Dependence on renal dialysis: Secondary | ICD-10-CM | POA: Diagnosis not present

## 2021-10-20 DIAGNOSIS — K219 Gastro-esophageal reflux disease without esophagitis: Secondary | ICD-10-CM | POA: Diagnosis not present

## 2021-10-20 DIAGNOSIS — N186 End stage renal disease: Secondary | ICD-10-CM | POA: Diagnosis not present

## 2021-10-20 DIAGNOSIS — J449 Chronic obstructive pulmonary disease, unspecified: Secondary | ICD-10-CM | POA: Diagnosis not present

## 2021-10-20 DIAGNOSIS — Z9981 Dependence on supplemental oxygen: Secondary | ICD-10-CM | POA: Diagnosis not present

## 2021-10-20 DIAGNOSIS — D509 Iron deficiency anemia, unspecified: Secondary | ICD-10-CM | POA: Diagnosis not present

## 2021-10-20 DIAGNOSIS — I4819 Other persistent atrial fibrillation: Secondary | ICD-10-CM | POA: Diagnosis not present

## 2021-10-20 DIAGNOSIS — J90 Pleural effusion, not elsewhere classified: Secondary | ICD-10-CM | POA: Diagnosis not present

## 2021-10-20 DIAGNOSIS — Z4932 Encounter for adequacy testing for peritoneal dialysis: Secondary | ICD-10-CM | POA: Diagnosis not present

## 2021-10-20 DIAGNOSIS — N2581 Secondary hyperparathyroidism of renal origin: Secondary | ICD-10-CM | POA: Diagnosis not present

## 2021-10-20 DIAGNOSIS — Z7901 Long term (current) use of anticoagulants: Secondary | ICD-10-CM | POA: Diagnosis not present

## 2021-10-20 DIAGNOSIS — I509 Heart failure, unspecified: Secondary | ICD-10-CM | POA: Diagnosis not present

## 2021-10-20 DIAGNOSIS — E440 Moderate protein-calorie malnutrition: Secondary | ICD-10-CM | POA: Diagnosis not present

## 2021-10-20 DIAGNOSIS — Z7951 Long term (current) use of inhaled steroids: Secondary | ICD-10-CM | POA: Diagnosis not present

## 2021-10-20 DIAGNOSIS — J9612 Chronic respiratory failure with hypercapnia: Secondary | ICD-10-CM | POA: Diagnosis not present

## 2021-10-20 DIAGNOSIS — Z87891 Personal history of nicotine dependence: Secondary | ICD-10-CM | POA: Diagnosis not present

## 2021-10-20 NOTE — Telephone Encounter (Signed)
MW please advise requesting the results of his thoracentesis. Will make him aware no results of the fluids are back yet. Any other recommendations? Thanks

## 2021-10-21 DIAGNOSIS — Z4932 Encounter for adequacy testing for peritoneal dialysis: Secondary | ICD-10-CM | POA: Diagnosis not present

## 2021-10-21 DIAGNOSIS — R82998 Other abnormal findings in urine: Secondary | ICD-10-CM | POA: Diagnosis not present

## 2021-10-21 DIAGNOSIS — N2581 Secondary hyperparathyroidism of renal origin: Secondary | ICD-10-CM | POA: Diagnosis not present

## 2021-10-21 DIAGNOSIS — E440 Moderate protein-calorie malnutrition: Secondary | ICD-10-CM | POA: Diagnosis not present

## 2021-10-21 DIAGNOSIS — Z992 Dependence on renal dialysis: Secondary | ICD-10-CM | POA: Diagnosis not present

## 2021-10-21 DIAGNOSIS — D631 Anemia in chronic kidney disease: Secondary | ICD-10-CM | POA: Diagnosis not present

## 2021-10-21 DIAGNOSIS — D509 Iron deficiency anemia, unspecified: Secondary | ICD-10-CM | POA: Diagnosis not present

## 2021-10-21 DIAGNOSIS — Z79899 Other long term (current) drug therapy: Secondary | ICD-10-CM | POA: Diagnosis not present

## 2021-10-21 DIAGNOSIS — K769 Liver disease, unspecified: Secondary | ICD-10-CM | POA: Diagnosis not present

## 2021-10-21 DIAGNOSIS — N186 End stage renal disease: Secondary | ICD-10-CM | POA: Diagnosis not present

## 2021-10-21 NOTE — Telephone Encounter (Signed)
Pt called back and stated that he is still waiting on results  I reviewed labs and the cytology is still pending  He is concerned bc he was told there was blood in his pleural fluid  I advised will let Dr Melvyn Novas know if his concerns and we will call him once cytology back  If breathing gets worse go to ED

## 2021-10-21 NOTE — Telephone Encounter (Signed)
Called and spoke with David Barton letting her know that MW was okay providing the verbal for PT and she verbalized understanding. Nothing further needed.

## 2021-10-21 NOTE — Telephone Encounter (Signed)
Ok to PT

## 2021-10-21 NOTE — Telephone Encounter (Signed)
MW please advise can we give verbal order for physical therapy.  Thanks :)

## 2021-10-21 NOTE — Telephone Encounter (Signed)
Called and spoke with pt letting him know the info stated by MW and he verbalized understanding. Stated that once cytology results were back that we would call him to go over the results.

## 2021-10-21 NOTE — Telephone Encounter (Signed)
LMTCB

## 2021-10-21 NOTE — Telephone Encounter (Signed)
Still waiting on cytology, will call when this is back but nothing else to do in meantime

## 2021-10-22 ENCOUNTER — Other Ambulatory Visit: Payer: Self-pay | Admitting: Nephrology

## 2021-10-22 ENCOUNTER — Ambulatory Visit
Admission: RE | Admit: 2021-10-22 | Discharge: 2021-10-22 | Disposition: A | Discharge status: Home / Self Care | Payer: Medicare Other | Source: Ambulatory Visit | Attending: Nephrology | Admitting: Nephrology

## 2021-10-22 DIAGNOSIS — R059 Cough, unspecified: Secondary | ICD-10-CM | POA: Diagnosis not present

## 2021-10-22 DIAGNOSIS — E440 Moderate protein-calorie malnutrition: Secondary | ICD-10-CM | POA: Diagnosis not present

## 2021-10-22 DIAGNOSIS — R0602 Shortness of breath: Secondary | ICD-10-CM

## 2021-10-22 DIAGNOSIS — Z992 Dependence on renal dialysis: Secondary | ICD-10-CM | POA: Diagnosis not present

## 2021-10-22 DIAGNOSIS — N186 End stage renal disease: Secondary | ICD-10-CM | POA: Diagnosis not present

## 2021-10-22 DIAGNOSIS — N2581 Secondary hyperparathyroidism of renal origin: Secondary | ICD-10-CM | POA: Diagnosis not present

## 2021-10-22 DIAGNOSIS — Z4932 Encounter for adequacy testing for peritoneal dialysis: Secondary | ICD-10-CM | POA: Diagnosis not present

## 2021-10-22 DIAGNOSIS — K769 Liver disease, unspecified: Secondary | ICD-10-CM | POA: Diagnosis not present

## 2021-10-22 DIAGNOSIS — D631 Anemia in chronic kidney disease: Secondary | ICD-10-CM | POA: Diagnosis not present

## 2021-10-22 DIAGNOSIS — Z79899 Other long term (current) drug therapy: Secondary | ICD-10-CM | POA: Diagnosis not present

## 2021-10-22 DIAGNOSIS — D509 Iron deficiency anemia, unspecified: Secondary | ICD-10-CM | POA: Diagnosis not present

## 2021-10-22 DIAGNOSIS — J90 Pleural effusion, not elsewhere classified: Secondary | ICD-10-CM | POA: Diagnosis not present

## 2021-10-22 DIAGNOSIS — R82998 Other abnormal findings in urine: Secondary | ICD-10-CM | POA: Diagnosis not present

## 2021-10-22 LAB — CYTOLOGY - NON PAP

## 2021-10-23 ENCOUNTER — Telehealth: Payer: Self-pay | Admitting: Cardiovascular Disease

## 2021-10-23 ENCOUNTER — Telehealth: Payer: Self-pay | Admitting: Internal Medicine

## 2021-10-23 DIAGNOSIS — R82998 Other abnormal findings in urine: Secondary | ICD-10-CM | POA: Diagnosis not present

## 2021-10-23 DIAGNOSIS — K769 Liver disease, unspecified: Secondary | ICD-10-CM | POA: Diagnosis not present

## 2021-10-23 DIAGNOSIS — Z4932 Encounter for adequacy testing for peritoneal dialysis: Secondary | ICD-10-CM | POA: Diagnosis not present

## 2021-10-23 DIAGNOSIS — E440 Moderate protein-calorie malnutrition: Secondary | ICD-10-CM | POA: Diagnosis not present

## 2021-10-23 DIAGNOSIS — D631 Anemia in chronic kidney disease: Secondary | ICD-10-CM | POA: Diagnosis not present

## 2021-10-23 DIAGNOSIS — Z79899 Other long term (current) drug therapy: Secondary | ICD-10-CM | POA: Diagnosis not present

## 2021-10-23 DIAGNOSIS — N186 End stage renal disease: Secondary | ICD-10-CM | POA: Diagnosis not present

## 2021-10-23 DIAGNOSIS — D509 Iron deficiency anemia, unspecified: Secondary | ICD-10-CM | POA: Diagnosis not present

## 2021-10-23 DIAGNOSIS — J90 Pleural effusion, not elsewhere classified: Secondary | ICD-10-CM

## 2021-10-23 DIAGNOSIS — N2581 Secondary hyperparathyroidism of renal origin: Secondary | ICD-10-CM | POA: Diagnosis not present

## 2021-10-23 DIAGNOSIS — Z992 Dependence on renal dialysis: Secondary | ICD-10-CM | POA: Diagnosis not present

## 2021-10-23 DIAGNOSIS — I480 Paroxysmal atrial fibrillation: Secondary | ICD-10-CM

## 2021-10-23 LAB — BODY FLUID CULTURE W GRAM STAIN: Culture: NO GROWTH

## 2021-10-23 NOTE — Telephone Encounter (Signed)
  Pt c/o medication issue:  1. Name of Medication: metoprolol   2. How are you currently taking this medication (dosage and times per day)?   3. Are you having a reaction (difficulty breathing--STAT)?   4. What is your medication issue? Pt said his dialysis doctor took him off metoprolol, he wanted to make sure if Dr. Gwenlyn Found aware of that and if that's ok with him

## 2021-10-23 NOTE — Telephone Encounter (Signed)
Pt called stating he wanted to make Dr. Gwenlyn Found aware his nephrologist stopped his metoprolol due to low BP. He state it's been running around 106-110. Pt unable to provide recent HR.  Will forward to MD.

## 2021-10-24 DIAGNOSIS — R82998 Other abnormal findings in urine: Secondary | ICD-10-CM | POA: Diagnosis not present

## 2021-10-24 DIAGNOSIS — Z4932 Encounter for adequacy testing for peritoneal dialysis: Secondary | ICD-10-CM | POA: Diagnosis not present

## 2021-10-24 DIAGNOSIS — Z992 Dependence on renal dialysis: Secondary | ICD-10-CM | POA: Diagnosis not present

## 2021-10-24 DIAGNOSIS — N2581 Secondary hyperparathyroidism of renal origin: Secondary | ICD-10-CM | POA: Diagnosis not present

## 2021-10-24 DIAGNOSIS — N186 End stage renal disease: Secondary | ICD-10-CM | POA: Diagnosis not present

## 2021-10-24 DIAGNOSIS — Z79899 Other long term (current) drug therapy: Secondary | ICD-10-CM | POA: Diagnosis not present

## 2021-10-24 DIAGNOSIS — K769 Liver disease, unspecified: Secondary | ICD-10-CM | POA: Diagnosis not present

## 2021-10-24 DIAGNOSIS — D631 Anemia in chronic kidney disease: Secondary | ICD-10-CM | POA: Diagnosis not present

## 2021-10-24 DIAGNOSIS — D509 Iron deficiency anemia, unspecified: Secondary | ICD-10-CM | POA: Diagnosis not present

## 2021-10-24 DIAGNOSIS — E440 Moderate protein-calorie malnutrition: Secondary | ICD-10-CM | POA: Diagnosis not present

## 2021-10-24 NOTE — Telephone Encounter (Signed)
MW please advise if there is a procedure that the pt needs to be scheduled for.  I did not see anything other than the thoracentesis that was done.  Thanks

## 2021-10-25 DIAGNOSIS — R82998 Other abnormal findings in urine: Secondary | ICD-10-CM | POA: Diagnosis not present

## 2021-10-25 DIAGNOSIS — N186 End stage renal disease: Secondary | ICD-10-CM | POA: Diagnosis not present

## 2021-10-25 DIAGNOSIS — Z4932 Encounter for adequacy testing for peritoneal dialysis: Secondary | ICD-10-CM | POA: Diagnosis not present

## 2021-10-25 DIAGNOSIS — D509 Iron deficiency anemia, unspecified: Secondary | ICD-10-CM | POA: Diagnosis not present

## 2021-10-25 DIAGNOSIS — R0602 Shortness of breath: Secondary | ICD-10-CM | POA: Diagnosis not present

## 2021-10-25 DIAGNOSIS — K769 Liver disease, unspecified: Secondary | ICD-10-CM | POA: Diagnosis not present

## 2021-10-25 DIAGNOSIS — Z992 Dependence on renal dialysis: Secondary | ICD-10-CM | POA: Diagnosis not present

## 2021-10-25 DIAGNOSIS — J449 Chronic obstructive pulmonary disease, unspecified: Secondary | ICD-10-CM | POA: Diagnosis not present

## 2021-10-25 DIAGNOSIS — Z79899 Other long term (current) drug therapy: Secondary | ICD-10-CM | POA: Diagnosis not present

## 2021-10-25 DIAGNOSIS — R918 Other nonspecific abnormal finding of lung field: Secondary | ICD-10-CM | POA: Diagnosis not present

## 2021-10-25 DIAGNOSIS — D631 Anemia in chronic kidney disease: Secondary | ICD-10-CM | POA: Diagnosis not present

## 2021-10-25 DIAGNOSIS — N2581 Secondary hyperparathyroidism of renal origin: Secondary | ICD-10-CM | POA: Diagnosis not present

## 2021-10-25 DIAGNOSIS — E440 Moderate protein-calorie malnutrition: Secondary | ICD-10-CM | POA: Diagnosis not present

## 2021-10-25 DIAGNOSIS — J90 Pleural effusion, not elsewhere classified: Secondary | ICD-10-CM | POA: Diagnosis not present

## 2021-10-25 DIAGNOSIS — I5043 Acute on chronic combined systolic (congestive) and diastolic (congestive) heart failure: Secondary | ICD-10-CM | POA: Diagnosis not present

## 2021-10-25 NOTE — Telephone Encounter (Signed)
Discussed with pt, rec echo

## 2021-10-25 NOTE — Telephone Encounter (Signed)
Echo cardiogram -  sent it to nurse to arrange but dx is bilateral pleural effusions, ok to go ahead and order.

## 2021-10-26 DIAGNOSIS — D631 Anemia in chronic kidney disease: Secondary | ICD-10-CM | POA: Diagnosis not present

## 2021-10-26 DIAGNOSIS — N186 End stage renal disease: Secondary | ICD-10-CM | POA: Diagnosis not present

## 2021-10-26 DIAGNOSIS — K769 Liver disease, unspecified: Secondary | ICD-10-CM | POA: Diagnosis not present

## 2021-10-26 DIAGNOSIS — I509 Heart failure, unspecified: Secondary | ICD-10-CM | POA: Diagnosis not present

## 2021-10-26 DIAGNOSIS — I4819 Other persistent atrial fibrillation: Secondary | ICD-10-CM | POA: Diagnosis not present

## 2021-10-26 DIAGNOSIS — J449 Chronic obstructive pulmonary disease, unspecified: Secondary | ICD-10-CM | POA: Diagnosis not present

## 2021-10-26 DIAGNOSIS — Z4932 Encounter for adequacy testing for peritoneal dialysis: Secondary | ICD-10-CM | POA: Diagnosis not present

## 2021-10-26 DIAGNOSIS — Z7951 Long term (current) use of inhaled steroids: Secondary | ICD-10-CM | POA: Diagnosis not present

## 2021-10-26 DIAGNOSIS — Z9181 History of falling: Secondary | ICD-10-CM | POA: Diagnosis not present

## 2021-10-26 DIAGNOSIS — Z7901 Long term (current) use of anticoagulants: Secondary | ICD-10-CM | POA: Diagnosis not present

## 2021-10-26 DIAGNOSIS — Z992 Dependence on renal dialysis: Secondary | ICD-10-CM | POA: Diagnosis not present

## 2021-10-26 DIAGNOSIS — Z79899 Other long term (current) drug therapy: Secondary | ICD-10-CM | POA: Diagnosis not present

## 2021-10-26 DIAGNOSIS — R82998 Other abnormal findings in urine: Secondary | ICD-10-CM | POA: Diagnosis not present

## 2021-10-26 DIAGNOSIS — Z87891 Personal history of nicotine dependence: Secondary | ICD-10-CM | POA: Diagnosis not present

## 2021-10-26 DIAGNOSIS — J90 Pleural effusion, not elsewhere classified: Secondary | ICD-10-CM | POA: Diagnosis not present

## 2021-10-26 DIAGNOSIS — D509 Iron deficiency anemia, unspecified: Secondary | ICD-10-CM | POA: Diagnosis not present

## 2021-10-26 DIAGNOSIS — J9612 Chronic respiratory failure with hypercapnia: Secondary | ICD-10-CM | POA: Diagnosis not present

## 2021-10-26 DIAGNOSIS — K219 Gastro-esophageal reflux disease without esophagitis: Secondary | ICD-10-CM | POA: Diagnosis not present

## 2021-10-26 DIAGNOSIS — J9611 Chronic respiratory failure with hypoxia: Secondary | ICD-10-CM | POA: Diagnosis not present

## 2021-10-26 DIAGNOSIS — N2581 Secondary hyperparathyroidism of renal origin: Secondary | ICD-10-CM | POA: Diagnosis not present

## 2021-10-26 DIAGNOSIS — Z9981 Dependence on supplemental oxygen: Secondary | ICD-10-CM | POA: Diagnosis not present

## 2021-10-26 DIAGNOSIS — E440 Moderate protein-calorie malnutrition: Secondary | ICD-10-CM | POA: Diagnosis not present

## 2021-10-26 NOTE — Telephone Encounter (Signed)
Called and spoke with patient. He is aware that MW wants him to have an echo. Advised him that I would place the order and specify that he will not be available on 11/29 nor 11/30.   Order has been placed. Nothing further needed.

## 2021-10-27 DIAGNOSIS — N186 End stage renal disease: Secondary | ICD-10-CM | POA: Diagnosis not present

## 2021-10-27 DIAGNOSIS — D631 Anemia in chronic kidney disease: Secondary | ICD-10-CM | POA: Diagnosis not present

## 2021-10-27 DIAGNOSIS — D509 Iron deficiency anemia, unspecified: Secondary | ICD-10-CM | POA: Diagnosis not present

## 2021-10-27 DIAGNOSIS — R82998 Other abnormal findings in urine: Secondary | ICD-10-CM | POA: Diagnosis not present

## 2021-10-27 DIAGNOSIS — K769 Liver disease, unspecified: Secondary | ICD-10-CM | POA: Diagnosis not present

## 2021-10-27 DIAGNOSIS — Z4932 Encounter for adequacy testing for peritoneal dialysis: Secondary | ICD-10-CM | POA: Diagnosis not present

## 2021-10-27 DIAGNOSIS — Z992 Dependence on renal dialysis: Secondary | ICD-10-CM | POA: Diagnosis not present

## 2021-10-27 DIAGNOSIS — N2581 Secondary hyperparathyroidism of renal origin: Secondary | ICD-10-CM | POA: Diagnosis not present

## 2021-10-27 DIAGNOSIS — E440 Moderate protein-calorie malnutrition: Secondary | ICD-10-CM | POA: Diagnosis not present

## 2021-10-27 DIAGNOSIS — Z79899 Other long term (current) drug therapy: Secondary | ICD-10-CM | POA: Diagnosis not present

## 2021-10-28 DIAGNOSIS — J90 Pleural effusion, not elsewhere classified: Secondary | ICD-10-CM | POA: Diagnosis not present

## 2021-10-28 DIAGNOSIS — I4819 Other persistent atrial fibrillation: Secondary | ICD-10-CM | POA: Diagnosis not present

## 2021-10-28 DIAGNOSIS — I509 Heart failure, unspecified: Secondary | ICD-10-CM | POA: Diagnosis not present

## 2021-10-28 DIAGNOSIS — K219 Gastro-esophageal reflux disease without esophagitis: Secondary | ICD-10-CM | POA: Diagnosis not present

## 2021-10-28 DIAGNOSIS — Z7951 Long term (current) use of inhaled steroids: Secondary | ICD-10-CM | POA: Diagnosis not present

## 2021-10-28 DIAGNOSIS — J9612 Chronic respiratory failure with hypercapnia: Secondary | ICD-10-CM | POA: Diagnosis not present

## 2021-10-28 DIAGNOSIS — Z992 Dependence on renal dialysis: Secondary | ICD-10-CM | POA: Diagnosis not present

## 2021-10-28 DIAGNOSIS — Z9181 History of falling: Secondary | ICD-10-CM | POA: Diagnosis not present

## 2021-10-28 DIAGNOSIS — D509 Iron deficiency anemia, unspecified: Secondary | ICD-10-CM | POA: Diagnosis not present

## 2021-10-28 DIAGNOSIS — N186 End stage renal disease: Secondary | ICD-10-CM | POA: Diagnosis not present

## 2021-10-28 DIAGNOSIS — Z7901 Long term (current) use of anticoagulants: Secondary | ICD-10-CM | POA: Diagnosis not present

## 2021-10-28 DIAGNOSIS — D631 Anemia in chronic kidney disease: Secondary | ICD-10-CM | POA: Diagnosis not present

## 2021-10-28 DIAGNOSIS — Z9981 Dependence on supplemental oxygen: Secondary | ICD-10-CM | POA: Diagnosis not present

## 2021-10-28 DIAGNOSIS — R82998 Other abnormal findings in urine: Secondary | ICD-10-CM | POA: Diagnosis not present

## 2021-10-28 DIAGNOSIS — K769 Liver disease, unspecified: Secondary | ICD-10-CM | POA: Diagnosis not present

## 2021-10-28 DIAGNOSIS — Z79899 Other long term (current) drug therapy: Secondary | ICD-10-CM | POA: Diagnosis not present

## 2021-10-28 DIAGNOSIS — Z87891 Personal history of nicotine dependence: Secondary | ICD-10-CM | POA: Diagnosis not present

## 2021-10-28 DIAGNOSIS — J449 Chronic obstructive pulmonary disease, unspecified: Secondary | ICD-10-CM | POA: Diagnosis not present

## 2021-10-28 DIAGNOSIS — J9611 Chronic respiratory failure with hypoxia: Secondary | ICD-10-CM | POA: Diagnosis not present

## 2021-10-28 DIAGNOSIS — Z4932 Encounter for adequacy testing for peritoneal dialysis: Secondary | ICD-10-CM | POA: Diagnosis not present

## 2021-10-28 DIAGNOSIS — N2581 Secondary hyperparathyroidism of renal origin: Secondary | ICD-10-CM | POA: Diagnosis not present

## 2021-10-28 DIAGNOSIS — E440 Moderate protein-calorie malnutrition: Secondary | ICD-10-CM | POA: Diagnosis not present

## 2021-10-28 NOTE — Progress Notes (Signed)
ECHO set for 11/11/21

## 2021-10-29 ENCOUNTER — Ambulatory Visit (INDEPENDENT_AMBULATORY_CARE_PROVIDER_SITE_OTHER): Payer: Medicare Other

## 2021-10-29 DIAGNOSIS — I480 Paroxysmal atrial fibrillation: Secondary | ICD-10-CM

## 2021-10-29 DIAGNOSIS — N2581 Secondary hyperparathyroidism of renal origin: Secondary | ICD-10-CM | POA: Diagnosis not present

## 2021-10-29 DIAGNOSIS — K769 Liver disease, unspecified: Secondary | ICD-10-CM | POA: Diagnosis not present

## 2021-10-29 DIAGNOSIS — Z4932 Encounter for adequacy testing for peritoneal dialysis: Secondary | ICD-10-CM | POA: Diagnosis not present

## 2021-10-29 DIAGNOSIS — D631 Anemia in chronic kidney disease: Secondary | ICD-10-CM | POA: Diagnosis not present

## 2021-10-29 DIAGNOSIS — E440 Moderate protein-calorie malnutrition: Secondary | ICD-10-CM | POA: Diagnosis not present

## 2021-10-29 DIAGNOSIS — D509 Iron deficiency anemia, unspecified: Secondary | ICD-10-CM | POA: Diagnosis not present

## 2021-10-29 DIAGNOSIS — Z992 Dependence on renal dialysis: Secondary | ICD-10-CM | POA: Diagnosis not present

## 2021-10-29 DIAGNOSIS — R82998 Other abnormal findings in urine: Secondary | ICD-10-CM | POA: Diagnosis not present

## 2021-10-29 DIAGNOSIS — Z79899 Other long term (current) drug therapy: Secondary | ICD-10-CM | POA: Diagnosis not present

## 2021-10-29 DIAGNOSIS — N186 End stage renal disease: Secondary | ICD-10-CM | POA: Diagnosis not present

## 2021-10-29 NOTE — Progress Notes (Unsigned)
Enrolled patient for a 7 day Zio XT monitor to be mailed to patients home.  

## 2021-10-29 NOTE — Telephone Encounter (Signed)
Pt updated with MD's recommendations and verbalized understanding. Monitor order placed.   Lorretta Harp, MD  You; Beatrix Fetters, RN 6 days ago   Get 1 week Zio patch to assess heart rate given that he was just taken off his beta-blocker.

## 2021-10-30 DIAGNOSIS — K769 Liver disease, unspecified: Secondary | ICD-10-CM | POA: Diagnosis not present

## 2021-10-30 DIAGNOSIS — R82998 Other abnormal findings in urine: Secondary | ICD-10-CM | POA: Diagnosis not present

## 2021-10-30 DIAGNOSIS — Z79899 Other long term (current) drug therapy: Secondary | ICD-10-CM | POA: Diagnosis not present

## 2021-10-30 DIAGNOSIS — E440 Moderate protein-calorie malnutrition: Secondary | ICD-10-CM | POA: Diagnosis not present

## 2021-10-30 DIAGNOSIS — Z992 Dependence on renal dialysis: Secondary | ICD-10-CM | POA: Diagnosis not present

## 2021-10-30 DIAGNOSIS — D509 Iron deficiency anemia, unspecified: Secondary | ICD-10-CM | POA: Diagnosis not present

## 2021-10-30 DIAGNOSIS — N186 End stage renal disease: Secondary | ICD-10-CM | POA: Diagnosis not present

## 2021-10-30 DIAGNOSIS — N2581 Secondary hyperparathyroidism of renal origin: Secondary | ICD-10-CM | POA: Diagnosis not present

## 2021-10-30 DIAGNOSIS — Z4932 Encounter for adequacy testing for peritoneal dialysis: Secondary | ICD-10-CM | POA: Diagnosis not present

## 2021-10-30 DIAGNOSIS — D631 Anemia in chronic kidney disease: Secondary | ICD-10-CM | POA: Diagnosis not present

## 2021-10-31 DIAGNOSIS — E440 Moderate protein-calorie malnutrition: Secondary | ICD-10-CM | POA: Diagnosis not present

## 2021-10-31 DIAGNOSIS — R82998 Other abnormal findings in urine: Secondary | ICD-10-CM | POA: Diagnosis not present

## 2021-10-31 DIAGNOSIS — D631 Anemia in chronic kidney disease: Secondary | ICD-10-CM | POA: Diagnosis not present

## 2021-10-31 DIAGNOSIS — K769 Liver disease, unspecified: Secondary | ICD-10-CM | POA: Diagnosis not present

## 2021-10-31 DIAGNOSIS — Z79899 Other long term (current) drug therapy: Secondary | ICD-10-CM | POA: Diagnosis not present

## 2021-10-31 DIAGNOSIS — N2581 Secondary hyperparathyroidism of renal origin: Secondary | ICD-10-CM | POA: Diagnosis not present

## 2021-10-31 DIAGNOSIS — D509 Iron deficiency anemia, unspecified: Secondary | ICD-10-CM | POA: Diagnosis not present

## 2021-10-31 DIAGNOSIS — Z992 Dependence on renal dialysis: Secondary | ICD-10-CM | POA: Diagnosis not present

## 2021-10-31 DIAGNOSIS — N186 End stage renal disease: Secondary | ICD-10-CM | POA: Diagnosis not present

## 2021-10-31 DIAGNOSIS — Z4932 Encounter for adequacy testing for peritoneal dialysis: Secondary | ICD-10-CM | POA: Diagnosis not present

## 2021-11-01 DIAGNOSIS — I480 Paroxysmal atrial fibrillation: Secondary | ICD-10-CM | POA: Diagnosis not present

## 2021-11-01 DIAGNOSIS — Z992 Dependence on renal dialysis: Secondary | ICD-10-CM | POA: Diagnosis not present

## 2021-11-01 DIAGNOSIS — K769 Liver disease, unspecified: Secondary | ICD-10-CM | POA: Diagnosis not present

## 2021-11-01 DIAGNOSIS — D631 Anemia in chronic kidney disease: Secondary | ICD-10-CM | POA: Diagnosis not present

## 2021-11-01 DIAGNOSIS — D509 Iron deficiency anemia, unspecified: Secondary | ICD-10-CM | POA: Diagnosis not present

## 2021-11-01 DIAGNOSIS — N186 End stage renal disease: Secondary | ICD-10-CM | POA: Diagnosis not present

## 2021-11-01 DIAGNOSIS — Z4932 Encounter for adequacy testing for peritoneal dialysis: Secondary | ICD-10-CM | POA: Diagnosis not present

## 2021-11-01 DIAGNOSIS — N2581 Secondary hyperparathyroidism of renal origin: Secondary | ICD-10-CM | POA: Diagnosis not present

## 2021-11-01 DIAGNOSIS — Z79899 Other long term (current) drug therapy: Secondary | ICD-10-CM | POA: Diagnosis not present

## 2021-11-01 DIAGNOSIS — R82998 Other abnormal findings in urine: Secondary | ICD-10-CM | POA: Diagnosis not present

## 2021-11-01 DIAGNOSIS — E440 Moderate protein-calorie malnutrition: Secondary | ICD-10-CM | POA: Diagnosis not present

## 2021-11-02 DIAGNOSIS — N2581 Secondary hyperparathyroidism of renal origin: Secondary | ICD-10-CM | POA: Diagnosis not present

## 2021-11-02 DIAGNOSIS — D509 Iron deficiency anemia, unspecified: Secondary | ICD-10-CM | POA: Diagnosis not present

## 2021-11-02 DIAGNOSIS — Z992 Dependence on renal dialysis: Secondary | ICD-10-CM | POA: Diagnosis not present

## 2021-11-02 DIAGNOSIS — K769 Liver disease, unspecified: Secondary | ICD-10-CM | POA: Diagnosis not present

## 2021-11-02 DIAGNOSIS — D631 Anemia in chronic kidney disease: Secondary | ICD-10-CM | POA: Diagnosis not present

## 2021-11-02 DIAGNOSIS — N186 End stage renal disease: Secondary | ICD-10-CM | POA: Diagnosis not present

## 2021-11-02 DIAGNOSIS — Z79899 Other long term (current) drug therapy: Secondary | ICD-10-CM | POA: Diagnosis not present

## 2021-11-02 DIAGNOSIS — Z4932 Encounter for adequacy testing for peritoneal dialysis: Secondary | ICD-10-CM | POA: Diagnosis not present

## 2021-11-02 DIAGNOSIS — E440 Moderate protein-calorie malnutrition: Secondary | ICD-10-CM | POA: Diagnosis not present

## 2021-11-02 DIAGNOSIS — R82998 Other abnormal findings in urine: Secondary | ICD-10-CM | POA: Diagnosis not present

## 2021-11-03 DIAGNOSIS — D631 Anemia in chronic kidney disease: Secondary | ICD-10-CM | POA: Diagnosis not present

## 2021-11-03 DIAGNOSIS — E440 Moderate protein-calorie malnutrition: Secondary | ICD-10-CM | POA: Diagnosis not present

## 2021-11-03 DIAGNOSIS — K769 Liver disease, unspecified: Secondary | ICD-10-CM | POA: Diagnosis not present

## 2021-11-03 DIAGNOSIS — Z4932 Encounter for adequacy testing for peritoneal dialysis: Secondary | ICD-10-CM | POA: Diagnosis not present

## 2021-11-03 DIAGNOSIS — D509 Iron deficiency anemia, unspecified: Secondary | ICD-10-CM | POA: Diagnosis not present

## 2021-11-03 DIAGNOSIS — N2581 Secondary hyperparathyroidism of renal origin: Secondary | ICD-10-CM | POA: Diagnosis not present

## 2021-11-03 DIAGNOSIS — Z992 Dependence on renal dialysis: Secondary | ICD-10-CM | POA: Diagnosis not present

## 2021-11-03 DIAGNOSIS — N186 End stage renal disease: Secondary | ICD-10-CM | POA: Diagnosis not present

## 2021-11-03 DIAGNOSIS — Z79899 Other long term (current) drug therapy: Secondary | ICD-10-CM | POA: Diagnosis not present

## 2021-11-03 DIAGNOSIS — R82998 Other abnormal findings in urine: Secondary | ICD-10-CM | POA: Diagnosis not present

## 2021-11-04 DIAGNOSIS — D631 Anemia in chronic kidney disease: Secondary | ICD-10-CM | POA: Diagnosis not present

## 2021-11-04 DIAGNOSIS — Z79899 Other long term (current) drug therapy: Secondary | ICD-10-CM | POA: Diagnosis not present

## 2021-11-04 DIAGNOSIS — N186 End stage renal disease: Secondary | ICD-10-CM | POA: Diagnosis not present

## 2021-11-04 DIAGNOSIS — R82998 Other abnormal findings in urine: Secondary | ICD-10-CM | POA: Diagnosis not present

## 2021-11-04 DIAGNOSIS — Z992 Dependence on renal dialysis: Secondary | ICD-10-CM | POA: Diagnosis not present

## 2021-11-04 DIAGNOSIS — D509 Iron deficiency anemia, unspecified: Secondary | ICD-10-CM | POA: Diagnosis not present

## 2021-11-04 DIAGNOSIS — K769 Liver disease, unspecified: Secondary | ICD-10-CM | POA: Diagnosis not present

## 2021-11-04 DIAGNOSIS — N2581 Secondary hyperparathyroidism of renal origin: Secondary | ICD-10-CM | POA: Diagnosis not present

## 2021-11-04 DIAGNOSIS — Z4932 Encounter for adequacy testing for peritoneal dialysis: Secondary | ICD-10-CM | POA: Diagnosis not present

## 2021-11-04 DIAGNOSIS — E440 Moderate protein-calorie malnutrition: Secondary | ICD-10-CM | POA: Diagnosis not present

## 2021-11-05 DIAGNOSIS — Z4932 Encounter for adequacy testing for peritoneal dialysis: Secondary | ICD-10-CM | POA: Diagnosis not present

## 2021-11-05 DIAGNOSIS — K769 Liver disease, unspecified: Secondary | ICD-10-CM | POA: Diagnosis not present

## 2021-11-05 DIAGNOSIS — N2581 Secondary hyperparathyroidism of renal origin: Secondary | ICD-10-CM | POA: Diagnosis not present

## 2021-11-05 DIAGNOSIS — D509 Iron deficiency anemia, unspecified: Secondary | ICD-10-CM | POA: Diagnosis not present

## 2021-11-05 DIAGNOSIS — E440 Moderate protein-calorie malnutrition: Secondary | ICD-10-CM | POA: Diagnosis not present

## 2021-11-05 DIAGNOSIS — Z79899 Other long term (current) drug therapy: Secondary | ICD-10-CM | POA: Diagnosis not present

## 2021-11-05 DIAGNOSIS — N186 End stage renal disease: Secondary | ICD-10-CM | POA: Diagnosis not present

## 2021-11-05 DIAGNOSIS — Z992 Dependence on renal dialysis: Secondary | ICD-10-CM | POA: Diagnosis not present

## 2021-11-05 DIAGNOSIS — R82998 Other abnormal findings in urine: Secondary | ICD-10-CM | POA: Diagnosis not present

## 2021-11-05 DIAGNOSIS — D631 Anemia in chronic kidney disease: Secondary | ICD-10-CM | POA: Diagnosis not present

## 2021-11-06 DIAGNOSIS — R82998 Other abnormal findings in urine: Secondary | ICD-10-CM | POA: Diagnosis not present

## 2021-11-06 DIAGNOSIS — K769 Liver disease, unspecified: Secondary | ICD-10-CM | POA: Diagnosis not present

## 2021-11-06 DIAGNOSIS — Z4932 Encounter for adequacy testing for peritoneal dialysis: Secondary | ICD-10-CM | POA: Diagnosis not present

## 2021-11-06 DIAGNOSIS — Z992 Dependence on renal dialysis: Secondary | ICD-10-CM | POA: Diagnosis not present

## 2021-11-06 DIAGNOSIS — N186 End stage renal disease: Secondary | ICD-10-CM | POA: Diagnosis not present

## 2021-11-06 DIAGNOSIS — N2581 Secondary hyperparathyroidism of renal origin: Secondary | ICD-10-CM | POA: Diagnosis not present

## 2021-11-06 DIAGNOSIS — E440 Moderate protein-calorie malnutrition: Secondary | ICD-10-CM | POA: Diagnosis not present

## 2021-11-06 DIAGNOSIS — D631 Anemia in chronic kidney disease: Secondary | ICD-10-CM | POA: Diagnosis not present

## 2021-11-06 DIAGNOSIS — D509 Iron deficiency anemia, unspecified: Secondary | ICD-10-CM | POA: Diagnosis not present

## 2021-11-06 DIAGNOSIS — Z79899 Other long term (current) drug therapy: Secondary | ICD-10-CM | POA: Diagnosis not present

## 2021-11-07 DIAGNOSIS — R82998 Other abnormal findings in urine: Secondary | ICD-10-CM | POA: Diagnosis not present

## 2021-11-07 DIAGNOSIS — Z4932 Encounter for adequacy testing for peritoneal dialysis: Secondary | ICD-10-CM | POA: Diagnosis not present

## 2021-11-07 DIAGNOSIS — Z79899 Other long term (current) drug therapy: Secondary | ICD-10-CM | POA: Diagnosis not present

## 2021-11-07 DIAGNOSIS — D509 Iron deficiency anemia, unspecified: Secondary | ICD-10-CM | POA: Diagnosis not present

## 2021-11-07 DIAGNOSIS — N2581 Secondary hyperparathyroidism of renal origin: Secondary | ICD-10-CM | POA: Diagnosis not present

## 2021-11-07 DIAGNOSIS — Z992 Dependence on renal dialysis: Secondary | ICD-10-CM | POA: Diagnosis not present

## 2021-11-07 DIAGNOSIS — K769 Liver disease, unspecified: Secondary | ICD-10-CM | POA: Diagnosis not present

## 2021-11-07 DIAGNOSIS — N186 End stage renal disease: Secondary | ICD-10-CM | POA: Diagnosis not present

## 2021-11-07 DIAGNOSIS — E440 Moderate protein-calorie malnutrition: Secondary | ICD-10-CM | POA: Diagnosis not present

## 2021-11-07 DIAGNOSIS — D631 Anemia in chronic kidney disease: Secondary | ICD-10-CM | POA: Diagnosis not present

## 2021-11-08 DIAGNOSIS — D509 Iron deficiency anemia, unspecified: Secondary | ICD-10-CM | POA: Diagnosis not present

## 2021-11-08 DIAGNOSIS — Z79899 Other long term (current) drug therapy: Secondary | ICD-10-CM | POA: Diagnosis not present

## 2021-11-08 DIAGNOSIS — E440 Moderate protein-calorie malnutrition: Secondary | ICD-10-CM | POA: Diagnosis not present

## 2021-11-08 DIAGNOSIS — Z4932 Encounter for adequacy testing for peritoneal dialysis: Secondary | ICD-10-CM | POA: Diagnosis not present

## 2021-11-08 DIAGNOSIS — K769 Liver disease, unspecified: Secondary | ICD-10-CM | POA: Diagnosis not present

## 2021-11-08 DIAGNOSIS — Z992 Dependence on renal dialysis: Secondary | ICD-10-CM | POA: Diagnosis not present

## 2021-11-08 DIAGNOSIS — N186 End stage renal disease: Secondary | ICD-10-CM | POA: Diagnosis not present

## 2021-11-08 DIAGNOSIS — R82998 Other abnormal findings in urine: Secondary | ICD-10-CM | POA: Diagnosis not present

## 2021-11-08 DIAGNOSIS — N2581 Secondary hyperparathyroidism of renal origin: Secondary | ICD-10-CM | POA: Diagnosis not present

## 2021-11-08 DIAGNOSIS — D631 Anemia in chronic kidney disease: Secondary | ICD-10-CM | POA: Diagnosis not present

## 2021-11-09 ENCOUNTER — Telehealth: Payer: Self-pay | Admitting: Internal Medicine

## 2021-11-09 DIAGNOSIS — R82998 Other abnormal findings in urine: Secondary | ICD-10-CM | POA: Diagnosis not present

## 2021-11-09 DIAGNOSIS — Z4932 Encounter for adequacy testing for peritoneal dialysis: Secondary | ICD-10-CM | POA: Diagnosis not present

## 2021-11-09 DIAGNOSIS — Z79899 Other long term (current) drug therapy: Secondary | ICD-10-CM | POA: Diagnosis not present

## 2021-11-09 DIAGNOSIS — Z992 Dependence on renal dialysis: Secondary | ICD-10-CM | POA: Diagnosis not present

## 2021-11-09 DIAGNOSIS — D509 Iron deficiency anemia, unspecified: Secondary | ICD-10-CM | POA: Diagnosis not present

## 2021-11-09 DIAGNOSIS — N186 End stage renal disease: Secondary | ICD-10-CM | POA: Diagnosis not present

## 2021-11-09 DIAGNOSIS — N2581 Secondary hyperparathyroidism of renal origin: Secondary | ICD-10-CM | POA: Diagnosis not present

## 2021-11-09 DIAGNOSIS — D631 Anemia in chronic kidney disease: Secondary | ICD-10-CM | POA: Diagnosis not present

## 2021-11-09 DIAGNOSIS — E440 Moderate protein-calorie malnutrition: Secondary | ICD-10-CM | POA: Diagnosis not present

## 2021-11-09 DIAGNOSIS — K769 Liver disease, unspecified: Secondary | ICD-10-CM | POA: Diagnosis not present

## 2021-11-09 MED ORDER — ALBUTEROL SULFATE HFA 108 (90 BASE) MCG/ACT IN AERS: INHALATION_SPRAY | RESPIRATORY_TRACT | 6 refills | Status: AC

## 2021-11-09 NOTE — Telephone Encounter (Signed)
I called and spoke with pts husband and she is aware of refill of the proair hfa.    She is going to call back either Wednesday afternoon or Friday to schedule the appt with APP to discuss his breathing.   She stated that she did not know if 1 rescue inhaler would last him 1 month.  She is aware that if his breathing is like this he will need further eval.  She voiced her understanding and will call back for appt.

## 2021-11-09 NOTE — Telephone Encounter (Signed)
David Barton states that pts breathing has become incredibly labored in the past week despite being on oxygen. States he is fine when he's sitting, but any minimal movent labors him. States oxygen level is fine. He is on spiriva. Wonders if he needs to be on something else as well. Please only call Carole. [phone]

## 2021-11-09 NOTE — Telephone Encounter (Signed)
He is supposed to have proair prn and ok to refill.  Would bring him back in to see NPs with all meds in hand to be sure he knows how to use them correctly

## 2021-11-09 NOTE — Telephone Encounter (Signed)
Spoke with [name] per DPR who states that pt's labor of breathing has increased over last week. [name] states pt is fine when at rest with O2 sats in the mid 90s but when he gets up to walk his labor increases but yet O2 sats remain the same. Pt is on 2 -2.5 L of O2 at all times. Pt is using Sprivia as directed. [name] states pt does not have PRN Albuterol, Duonebs nor a nebulizer machine. [name] also states Echo was scheduled for Wednesday 11/11/21. Dr. Melvyn Novas please advise.

## 2021-11-10 DIAGNOSIS — Z79899 Other long term (current) drug therapy: Secondary | ICD-10-CM | POA: Diagnosis not present

## 2021-11-10 DIAGNOSIS — D509 Iron deficiency anemia, unspecified: Secondary | ICD-10-CM | POA: Diagnosis not present

## 2021-11-10 DIAGNOSIS — R82998 Other abnormal findings in urine: Secondary | ICD-10-CM | POA: Diagnosis not present

## 2021-11-10 DIAGNOSIS — N186 End stage renal disease: Secondary | ICD-10-CM | POA: Diagnosis not present

## 2021-11-10 DIAGNOSIS — K769 Liver disease, unspecified: Secondary | ICD-10-CM | POA: Diagnosis not present

## 2021-11-10 DIAGNOSIS — E440 Moderate protein-calorie malnutrition: Secondary | ICD-10-CM | POA: Diagnosis not present

## 2021-11-10 DIAGNOSIS — Z992 Dependence on renal dialysis: Secondary | ICD-10-CM | POA: Diagnosis not present

## 2021-11-10 DIAGNOSIS — D631 Anemia in chronic kidney disease: Secondary | ICD-10-CM | POA: Diagnosis not present

## 2021-11-10 DIAGNOSIS — N2581 Secondary hyperparathyroidism of renal origin: Secondary | ICD-10-CM | POA: Diagnosis not present

## 2021-11-10 DIAGNOSIS — Z4932 Encounter for adequacy testing for peritoneal dialysis: Secondary | ICD-10-CM | POA: Diagnosis not present

## 2021-11-11 ENCOUNTER — Other Ambulatory Visit: Payer: Self-pay

## 2021-11-11 ENCOUNTER — Ambulatory Visit (HOSPITAL_COMMUNITY): Payer: Medicare Other | Attending: Cardiovascular Disease

## 2021-11-11 DIAGNOSIS — Z4932 Encounter for adequacy testing for peritoneal dialysis: Secondary | ICD-10-CM | POA: Diagnosis not present

## 2021-11-11 DIAGNOSIS — J439 Emphysema, unspecified: Secondary | ICD-10-CM | POA: Insufficient documentation

## 2021-11-11 DIAGNOSIS — E440 Moderate protein-calorie malnutrition: Secondary | ICD-10-CM | POA: Diagnosis not present

## 2021-11-11 DIAGNOSIS — Z87891 Personal history of nicotine dependence: Secondary | ICD-10-CM | POA: Diagnosis not present

## 2021-11-11 DIAGNOSIS — K769 Liver disease, unspecified: Secondary | ICD-10-CM | POA: Diagnosis not present

## 2021-11-11 DIAGNOSIS — Z79899 Other long term (current) drug therapy: Secondary | ICD-10-CM | POA: Diagnosis not present

## 2021-11-11 DIAGNOSIS — I361 Nonrheumatic tricuspid (valve) insufficiency: Secondary | ICD-10-CM

## 2021-11-11 DIAGNOSIS — Z992 Dependence on renal dialysis: Secondary | ICD-10-CM | POA: Diagnosis not present

## 2021-11-11 DIAGNOSIS — I120 Hypertensive chronic kidney disease with stage 5 chronic kidney disease or end stage renal disease: Secondary | ICD-10-CM | POA: Insufficient documentation

## 2021-11-11 DIAGNOSIS — J90 Pleural effusion, not elsewhere classified: Secondary | ICD-10-CM | POA: Insufficient documentation

## 2021-11-11 DIAGNOSIS — I350 Nonrheumatic aortic (valve) stenosis: Secondary | ICD-10-CM | POA: Diagnosis not present

## 2021-11-11 DIAGNOSIS — N2581 Secondary hyperparathyroidism of renal origin: Secondary | ICD-10-CM | POA: Diagnosis not present

## 2021-11-11 DIAGNOSIS — D509 Iron deficiency anemia, unspecified: Secondary | ICD-10-CM | POA: Diagnosis not present

## 2021-11-11 DIAGNOSIS — N186 End stage renal disease: Secondary | ICD-10-CM | POA: Diagnosis not present

## 2021-11-11 DIAGNOSIS — R82998 Other abnormal findings in urine: Secondary | ICD-10-CM | POA: Diagnosis not present

## 2021-11-11 DIAGNOSIS — D631 Anemia in chronic kidney disease: Secondary | ICD-10-CM | POA: Diagnosis not present

## 2021-11-11 LAB — ECHOCARDIOGRAM COMPLETE: S' Lateral: 3.1 cm

## 2021-11-12 DIAGNOSIS — Z992 Dependence on renal dialysis: Secondary | ICD-10-CM | POA: Diagnosis not present

## 2021-11-12 DIAGNOSIS — D631 Anemia in chronic kidney disease: Secondary | ICD-10-CM | POA: Diagnosis not present

## 2021-11-12 DIAGNOSIS — E440 Moderate protein-calorie malnutrition: Secondary | ICD-10-CM | POA: Diagnosis not present

## 2021-11-12 DIAGNOSIS — Z79899 Other long term (current) drug therapy: Secondary | ICD-10-CM | POA: Diagnosis not present

## 2021-11-12 DIAGNOSIS — N2581 Secondary hyperparathyroidism of renal origin: Secondary | ICD-10-CM | POA: Diagnosis not present

## 2021-11-12 DIAGNOSIS — Z4932 Encounter for adequacy testing for peritoneal dialysis: Secondary | ICD-10-CM | POA: Diagnosis not present

## 2021-11-12 DIAGNOSIS — N186 End stage renal disease: Secondary | ICD-10-CM | POA: Diagnosis not present

## 2021-11-12 DIAGNOSIS — K769 Liver disease, unspecified: Secondary | ICD-10-CM | POA: Diagnosis not present

## 2021-11-12 DIAGNOSIS — R82998 Other abnormal findings in urine: Secondary | ICD-10-CM | POA: Diagnosis not present

## 2021-11-12 DIAGNOSIS — D509 Iron deficiency anemia, unspecified: Secondary | ICD-10-CM | POA: Diagnosis not present

## 2021-11-13 ENCOUNTER — Encounter: Payer: Self-pay | Admitting: Nurse Practitioner

## 2021-11-13 ENCOUNTER — Other Ambulatory Visit: Payer: Self-pay

## 2021-11-13 ENCOUNTER — Ambulatory Visit (INDEPENDENT_AMBULATORY_CARE_PROVIDER_SITE_OTHER): Payer: Medicare Other | Admitting: Nurse Practitioner

## 2021-11-13 ENCOUNTER — Ambulatory Visit (INDEPENDENT_AMBULATORY_CARE_PROVIDER_SITE_OTHER): Payer: Medicare Other

## 2021-11-13 VITALS — BP 118/58 | HR 104 | Temp 97.6°F | Ht 70.0 in | Wt 191.0 lb

## 2021-11-13 DIAGNOSIS — N186 End stage renal disease: Secondary | ICD-10-CM | POA: Diagnosis not present

## 2021-11-13 DIAGNOSIS — J449 Chronic obstructive pulmonary disease, unspecified: Secondary | ICD-10-CM | POA: Diagnosis not present

## 2021-11-13 DIAGNOSIS — J189 Pneumonia, unspecified organism: Secondary | ICD-10-CM | POA: Insufficient documentation

## 2021-11-13 DIAGNOSIS — D631 Anemia in chronic kidney disease: Secondary | ICD-10-CM | POA: Diagnosis not present

## 2021-11-13 DIAGNOSIS — I5032 Chronic diastolic (congestive) heart failure: Secondary | ICD-10-CM

## 2021-11-13 DIAGNOSIS — J441 Chronic obstructive pulmonary disease with (acute) exacerbation: Secondary | ICD-10-CM

## 2021-11-13 DIAGNOSIS — Z4932 Encounter for adequacy testing for peritoneal dialysis: Secondary | ICD-10-CM | POA: Diagnosis not present

## 2021-11-13 DIAGNOSIS — D509 Iron deficiency anemia, unspecified: Secondary | ICD-10-CM | POA: Diagnosis not present

## 2021-11-13 DIAGNOSIS — R82998 Other abnormal findings in urine: Secondary | ICD-10-CM | POA: Diagnosis not present

## 2021-11-13 DIAGNOSIS — J90 Pleural effusion, not elsewhere classified: Secondary | ICD-10-CM

## 2021-11-13 DIAGNOSIS — I4811 Longstanding persistent atrial fibrillation: Secondary | ICD-10-CM | POA: Diagnosis not present

## 2021-11-13 DIAGNOSIS — I480 Paroxysmal atrial fibrillation: Secondary | ICD-10-CM | POA: Diagnosis not present

## 2021-11-13 DIAGNOSIS — K769 Liver disease, unspecified: Secondary | ICD-10-CM | POA: Diagnosis not present

## 2021-11-13 DIAGNOSIS — N2581 Secondary hyperparathyroidism of renal origin: Secondary | ICD-10-CM | POA: Diagnosis not present

## 2021-11-13 DIAGNOSIS — J9611 Chronic respiratory failure with hypoxia: Secondary | ICD-10-CM | POA: Diagnosis not present

## 2021-11-13 DIAGNOSIS — R059 Cough, unspecified: Secondary | ICD-10-CM | POA: Diagnosis not present

## 2021-11-13 DIAGNOSIS — Z79899 Other long term (current) drug therapy: Secondary | ICD-10-CM | POA: Diagnosis not present

## 2021-11-13 DIAGNOSIS — Z992 Dependence on renal dialysis: Secondary | ICD-10-CM | POA: Diagnosis not present

## 2021-11-13 DIAGNOSIS — E440 Moderate protein-calorie malnutrition: Secondary | ICD-10-CM | POA: Diagnosis not present

## 2021-11-13 MED ORDER — PREDNISONE 10 MG PO TABS: ORAL_TABLET | ORAL | 0 refills | Status: DC

## 2021-11-13 MED ORDER — AMOXICILLIN-POT CLAVULANATE 875-125 MG PO TABS: 1.0000 | ORAL_TABLET | Freq: Two times a day (BID) | ORAL | 0 refills | Status: DC

## 2021-11-13 MED ORDER — LEVALBUTEROL HCL 0.63 MG/3ML IN NEBU: 0.6300 mg | INHALATION_SOLUTION | Freq: Four times a day (QID) | RESPIRATORY_TRACT | 12 refills | Status: AC | PRN

## 2021-11-13 MED ORDER — TRELEGY ELLIPTA 100-62.5-25 MCG/ACT IN AEPB: 100.0000 ug | INHALATION_SPRAY | Freq: Once | RESPIRATORY_TRACT | 0 refills | Status: AC

## 2021-11-13 NOTE — Assessment & Plan Note (Signed)
See above plan. 

## 2021-11-13 NOTE — Assessment & Plan Note (Signed)
See above plan. Rate controlled. Follow up with cardiology as scheduled.

## 2021-11-13 NOTE — Addendum Note (Signed)
Addended by: Fenton Foy on: 11/13/2021 01:08 PM   Modules accepted: Orders

## 2021-11-13 NOTE — Assessment & Plan Note (Signed)
Symptoms likely related to COPD exacerbation, pleural effusions or both. Xopenex neb administered in clinic for scattered wheezes. Chronically in a fib but rate controlled. Recent echo did not indicate heart failure as cause of DOE or worsening symptoms. Recent thoracentesis with approx 20 mL of fluid removed per patient with no improvement in symptoms. Maintain close follow up. Could consider increasing Trelegy to 200.   Patient Instructions  Continue Trelegy inhaler 1 puff daily, rinse after  Continue albuterol inhaler 2 puffs every 4 hours as needed for shortness of breath or wheezing  Continue home O2 at 2-2.5L [place]. Notify of any increased needs.  Xopenex (levalbuterol) 3 mL nebulizer Twice daily until symptoms improve and then every 6 hours as needed for shortness of breath or wheezing. Notify of any increased heart rate.  Mucinex over the counter twice daily Chlortab 4 mg over the counter as directed at bedtime for cough  Prednisone taper pack. 4 tabs for 2 days, then 3 tabs for 2 days, 2 tabs for 2 days, then 1 tab for 2 days, then stop  Augmentin Twice daily for 7 days. Take with food. Use a probiotic over the counter daily.  Notify immediately of any rash, itching, hives, or swelling, or seek emergency care. Finish your antibiotics in their entirety. Do not stop just because symptoms improve.   Chest x ray today to evaluate previous pleural effusion and rule out any underlying infectious cause.   Pulmonary rehab referral sent. Will discuss possible repeat PFTs at next visit.  Follow up in 2 weeks with Dr. Melvyn Novas, Roxan Diesel, NP, or APP. If symptoms do not improve or worsen, please contact office for sooner follow up or seek emergency care.

## 2021-11-13 NOTE — Assessment & Plan Note (Addendum)
CXR today showed perihilar and basilar opacity concerning for pneumonia. Mild right pleural effusion which is increased in size from prior exam. Continue with current plan of augmentin for 7 days. Close follow up. Return and ED precautions discussed. See above plan.

## 2021-11-13 NOTE — Assessment & Plan Note (Signed)
No s/s of fluid overload or acute exacerbation. See above plan. Follow up with cardiology.

## 2021-11-13 NOTE — Patient Instructions (Addendum)
Continue Trelegy inhaler 1 puff daily, rinse after  Continue albuterol inhaler 2 puffs every 4 hours as needed for shortness of breath or wheezing  Continue home O2 at 2-2.5L [place]. Notify of any increased needs.  Xopenex (levalbuterol) 3 mL nebulizer Twice daily until symptoms improve and then every 6 hours as needed for shortness of breath or wheezing. Notify of any increased heart rate.  Mucinex over the counter twice daily Chlortab 4 mg over the counter as directed at bedtime for cough  Prednisone taper pack. 4 tabs for 2 days, then 3 tabs for 2 days, 2 tabs for 2 days, then 1 tab for 2 days, then stop  Augmentin Twice daily for 7 days. Take with food. Use a probiotic over the counter daily.  Notify immediately of any rash, itching, hives, or swelling, or seek emergency care. Finish your antibiotics in their entirety. Do not stop just because symptoms improve.   Chest x ray today to evaluate previous pleural effusion and rule out any underlying infectious cause.   Pulmonary rehab referral sent. Will discuss possible repeat PFTs at next visit.  Follow up in 2 weeks with Dr. Melvyn Novas, Roxan Diesel, NP, or APP. If symptoms do not improve or worsen, please contact office for sooner follow up or seek emergency care.

## 2021-11-13 NOTE — Progress Notes (Addendum)
@Patient  ID: David Barton, male    DOB: May 05, 1933, 87 y.o.   MRN: [id number]  Chief Complaint  Patient presents with   Acute Visit    Increased SOB for about two weeks now. Is unable to walk even with the oxygen. He is currently on 2.5 Liter nasal cannula.     Referring provider: Chesley Noon, MD  HPI: 87 year old male, former smoker (20-pack-years) followed for COPD Gold II with emphysema, chronic chronic respiratory failure on home oxygen, and chronic bilateral pleural effusions.  He is a patient of Dr. Gustavus Bryant and was last seen in office on 10/09/2021.  Past medical history significant for hypertension, persistent A. Fib on chronic anticoagulation therapy with Eliquis, diastolic heart failure, colon cancer, end-stage renal disease on dialysis.  He is followed by cardiology.  TEST/EVENTS:  07/16/2013 PFTs: FEV1 pre 1.75 (61%), FEV1 post 2.02 (71%), FEV1:FVC 52, TLC 7.54 (110), DLCO 74%. 10/09/2021 CXR 2 view: Stable cardiomediastinal constructs worse.  Arthrosclerosis calcification of the aortic knob.  Persistent moderate right-sided pleural effusion.  Patchy airspace opacity within the inferior aspect of the right upper lobe, similar to prior.  Left lung remains hyperexpanded expanded and otherwise clear.  No pneumothorax 10/22/2021 CXR 2 view: Interval increase in heterogeneous airspace opacity of the right lung, with a small right pleural effusion, similar in volume to prior examination.  Emphysema of the left lung which is otherwise normally aerated.  Mild cardiomegaly. 11/11/2021 echocardiogram: LVEF 45 to 50%.  Left ventricle has mildly decreased function.  Right ventricle systolic function is normal.  The right ventricle size is normal.  MVV is normal in structure and no evidence of MV regurg.  Aortic valve is calcified.  There is mild calcification and thickening.  No stenosis or regurgitation noted.  10/09/2021: OV with Dr. Melvyn Novas.  Noted to have increased shortness of breath  with exertion.  Chronic moderate right pleural effusion noticed on chest x-ray.  Recommended thoracentesis.  Maintained on Trelegy inhaler, as needed Laurence Ferrari, and home O2.  10/19/2021: IR Thoracentesis with culture.  No growth.  Gram stain with few WBCs present predominantly Mono nuclear.  No microorganisms seen  11/13/2021: Today - acute sick visit Patient presents today with wife with persistent shortness of breath with exertion that has not improved since his previous visit. DOE has persisted but cough has increased in frequency and sputum. He is on chronic home O2 at 2-2.5L/min when he is participating in activity. At rest, he removes the oxygen and his O2 sats stay in the 90's. Persistent cough with increased yellow sputum production.  He experiences orthopnea at baseline and sleeps with head of bed elevated.  He denies leg swelling or frothy pink sputum production.  He denies fevers or chills, wheezing, PND, chest pain, congestion, rhinorrhea, or shortness of breath at rest.  He denies any recent sick exposures.  He continues his Trelegy inhaler daily and rarely uses his albuterol rescue inhaler.  He is chronically in A. fib but rate controlled and on Eliquis.  Overall, he feels his breathing is not improving.  Allergies  Allergen Reactions   Amlodipine Swelling   Tizanidine Other (See Comments)    Other reaction(s): hypotension    Immunization History  Administered Date(s) Administered   Influenza, High Dose Seasonal PF 09/30/2018, 09/10/2019, 08/20/2020   Influenza-Unspecified 09/26/2021   Moderna Sars-Covid-2 Vaccination 01/26/2020, 03/03/2020, 10/15/2020   Pneumococcal Polysaccharide-23 05/30/2019   Tdap 07/20/2018    Past Medical History:  Diagnosis Date   Alcohol abuse  Colon cancer ([place])    COPD (chronic obstructive pulmonary disease) ([name])    Emphysema of lung ([name])    ESRD (end stage renal disease) ([place])    Former tobacco use    Hypertension    Peripheral edema     Sepsis ([place]) 10/2016   Aspiration PNA/C diff colitis    Tobacco History: Social History   Tobacco Use  Smoking Status Former   Packs/day: 1.00   Years: 20.00   Pack years: 20.00   Types: Cigarettes   Quit date: 12/20/1961   Years since quitting: 59.9  Smokeless Tobacco Never   Counseling given: Not Answered   Outpatient Medications Prior to Visit  Medication Sig Dispense Refill   albuterol (PROAIR HFA) 108 (90 Base) MCG/ACT inhaler 2 puffs every 4 hours as needed only  if your can't catch your breath 8 g 6   apixaban (ELIQUIS) 2.5 MG TABS tablet TAKE 1 TABLET(2.5 MG) BY MOUTH TWICE DAILY 180 tablet 1   multivitamin (RENA-VIT) TABS tablet Take 1 tablet by mouth at bedtime. 90 tablet 0   sevelamer carbonate (RENVELA) 800 MG tablet Take 1 tablet (800 mg total) by mouth 3 (three) times daily with meals. 270 tablet 0   TRELEGY ELLIPTA 100-62.5-25 MCG/INH AEPB INHALE 1 PUFF INTO THE LUNGS DAILY 60 each 5   zolpidem (AMBIEN) 10 MG tablet Take 10 mg by mouth at bedtime as needed.     calcitRIOL (ROCALTROL) 0.5 MCG capsule Take 0.5 mcg by mouth daily.     famotidine (PEPCID) 20 MG tablet One after supper (Patient not taking: Reported on 11/13/2021) 30 tablet 11   pantoprazole (PROTONIX) 40 MG tablet Take 1 tablet (40 mg total) by mouth daily. Take 30-60 min before first meal of the day (Patient not taking: Reported on 11/13/2021) 30 tablet 2   No facility-administered medications prior to visit.     Review of Systems:   Constitutional: No weight loss or gain, night sweats, fevers, chills, fatigue, or lassitude. HEENT: No headaches, difficulty swallowing, tooth/dental problems, or sore throat. No sneezing, itching, ear ache, nasal congestion, or post nasal drip CV:  +orthopnea (unchanged from baseline; sleeps at an incline; recent echo mild reduction in EF) No chest pain, PND, swelling in lower extremities, anasarca, dizziness, palpitations, syncope Resp: +shortness of breath with  exertion. +Increased cough with increased sputum production (yellow) No hemoptysis. No wheezing per patient and wife.   GI:  No heartburn, indigestion, abdominal pain, nausea, vomiting, diarrhea, change in bowel habits, loss of appetite, bloody stools.  GU: No dysuria, change in color of urine, urgency or frequency.  No flank pain, no hematuria  Skin: No rash, lesions, ulcerations MSK:  No joint pain or swelling.  No decreased range of motion.  No back pain. Neuro: No dizziness or lightheadedness.  Psych: No depression or anxiety. Mood stable.     Physical Exam:  BP (!) 118/58 (BP Location: Left Arm, Patient Position: Sitting, Cuff Size: Normal)   Pulse (!) 104   Temp 97.6 F (36.4 C) (Oral)   Ht 5\' 10"  (1.778 m)   Wt 191 lb (86.6 kg)   SpO2 97% Comment: 2.5 liters [place]  BMI 27.41 kg/m   GEN: Pleasant, interactive, chronically-ill appearing; in no acute distress. HEENT:  Normocephalic and atraumatic. EACs patent bilaterally. TM pearly gray with present light reflex bilaterally. PERRLA. Sclera white. Nasal turbinates pink, moist and patent bilaterally. No rhinorrhea present. Oropharynx pink and moist, without exudate or edema. No lesions, ulcerations, or postnasal  drip.  NECK:  Supple w/ fair ROM. No JVD present. Normal carotid impulses w/o bruits. Thyroid symmetrical with no goiter or nodules palpated. No lymphadenopathy.   CV: Irregular rhythm (chronic a fib), rate controlled, no m/r/g, no peripheral edema. Pulses intact, +2 bilaterally. No cyanosis, pallor or clubbing. PULMONARY:  Unlabored, regular breathing. Rhonchorous breath sounds with inspiratory wheezes scattered throughout.  No accessory muscle use. No dullness to percussion. GI: BS present and normoactive. Soft, non-tender to palpation. No organomegaly or masses detected. No CVA tenderness. MSK: No erythema, warmth or tenderness. Cap refil <2 sec all extrem. No deformities or joint swelling noted.  Neuro: A/Ox3. No focal  deficits noted.   Skin: Warm, no lesions or rashe Psych: Normal affect and behavior. Judgement and thought content appropriate.     Lab Results:  CBC    Component Value Date/Time   WBC 10.8 (H) 01/19/2021 1200   RBC 3.14 (L) 01/19/2021 1200   HGB 10.7 (L) 01/19/2021 1200   HGB 14.1 12/21/2018 1038   HGB 14.9 11/24/2009 0958   HCT 31.9 (L) 01/19/2021 1200   HCT 30.1 (L) 06/09/2019 1329   HCT 43.8 11/24/2009 0958   PLT 365.0 01/19/2021 1200   PLT 276 12/21/2018 1038   MCV 101.4 (H) 01/19/2021 1200   MCV 101 (H) 12/21/2018 1038   MCV 108.6 (H) 11/24/2009 0958   MCH 30.1 08/20/2019 1429   MCHC 33.5 01/19/2021 1200   RDW 15.3 01/19/2021 1200   RDW 12.5 12/21/2018 1038   RDW 15.4 (H) 11/24/2009 0958   LYMPHSABS 0.8 01/19/2021 1200   LYMPHSABS 2.2 11/24/2009 0958   MONOABS 1.3 (H) 01/19/2021 1200   MONOABS 0.6 11/24/2009 0958   EOSABS 0.0 01/19/2021 1200   EOSABS 0.2 11/24/2009 0958   BASOSABS 0.0 01/19/2021 1200   BASOSABS 0.0 11/24/2009 0958    BMET    Component Value Date/Time   NA 131 (L) 01/19/2021 1200   NA 139 12/21/2018 1045   K 3.8 01/19/2021 1200   CL 90 (L) 01/19/2021 1200   CO2 23 01/19/2021 1200   GLUCOSE 90 01/19/2021 1200   BUN 45 (H) 01/19/2021 1200   BUN 35 (H) 12/21/2018 1045   CREATININE 7.99 (HH) 01/19/2021 1200   CALCIUM 8.9 01/19/2021 1200   GFRNONAA 10 (L) 08/20/2019 1429   GFRAA 11 (L) 08/20/2019 1429    BNP    Component Value Date/Time   BNP 465.7 (H) 05/19/2019 0901     Imaging:  DG Chest 2 View  Result Date: 11/13/2021 CLINICAL DATA:  Productive cough. EXAM: CHEST - 2 VIEW COMPARISON:  October 22, 2021. FINDINGS: Stable cardiomediastinal silhouette. No pneumothorax is noted. Left lung is clear. Mild right pleural effusion is noted which is increased compared to prior exam. Perihilar and basilar opacity is noted concerning for pneumonia. Bony thorax is unremarkable. IMPRESSION: Right perihilar and basilar pneumonia is noted.  Increased right pleural effusion. Electronically Signed   By: Marijo Conception M.D.   On: 11/13/2021 12:33   DG Chest 2 View  Result Date: 10/22/2021 CLINICAL DATA:  Shortness of breath, cough, emphysema EXAM: CHEST - 2 VIEW COMPARISON:  10/19/2021 FINDINGS: Interval increase in heterogeneous airspace opacity of the right lung, with a small right pleural effusion, similar in volume to prior examination. Emphysema of the left lung, which is otherwise normally aerated. Mild cardiomegaly. Osseous structures are unremarkable. IMPRESSION: 1. Interval increase in heterogeneous airspace opacity of the right lung. 2. Small right pleural effusion, similar in volume to  prior examination. 3. Emphysema of the left lung, which is otherwise normally aerated. Electronically Signed   By: Delanna Ahmadi M.D.   On: 10/22/2021 14:07   DG Chest Port 1 View  Result Date: 10/19/2021 CLINICAL DATA:  Difficulty breathing, status post thoracentesis EXAM: PORTABLE CHEST 1 VIEW COMPARISON:  Previous studies including the examination of 10/09/2021 FINDINGS: There is interval decrease in right pleural effusion after thoracentesis. There is no pneumothorax. There is small residual right pleural effusion. There is interval decrease in interstitial infiltrate in right parahilar region. Patchy infiltrate in the right lower lung fields may suggest atelectasis/pneumonitis. There are small transverse linear densities in the left lower lung fields, possibly subsegmental atelectasis. Apparent shift of mediastinum to the right may be due to rotation. Cardiac size is within normal limits. Thoracic aorta is tortuous and ectatic. There are no signs of alveolar pulmonary edema. IMPRESSION: There is interval decrease in right pleural effusion. There is no pneumothorax. There is interval decrease in interstitial infiltrate in right parahilar region. Residual increased markings in the right lower lung field may be due to residual pleural effusion and  infiltrate. Linear densities in left lower lung fields suggest subsegmental atelectasis. Electronically Signed   By: Elmer Picker M.D.   On: 10/19/2021 13:46   ECHOCARDIOGRAM COMPLETE  Result Date: 11/11/2021    ECHOCARDIOGRAM REPORT   Patient Name:   David Barton Date of Exam: 11/11/2021 Medical Rec #:  [id number]       Height:       70.0 in Accession #:    [id number]      Weight:       190.2 lb Date of Birth:  12/05/1933      BSA:          2.043 m Patient Age:    45 years        BP:           100/64 mmHg Patient Gender: M               HR:           105 bpm. Exam Location:  [facility] Procedure: 2D Echo, Cardiac Doppler and Color Doppler Indications:    J90 Bilateral Pleural Effusion  History:        Patient has prior history of Echocardiogram examinations, most                 recent 05/21/2019. COPD; Risk Factors:Alcohol Abuse, Former Smoker                 and Hypertension. Emphysema of Lung. End Stage Renal Disease.  Sonographer:    Wilford Sports Rodgers-Jones RDCS Referring Phys: [place]  1. Left ventricular ejection fraction, by estimation, is 45 to 50%. The left ventricle has mildly decreased function. The left ventricle demonstrates global hypokinesis. Left ventricular diastolic function could not be evaluated.  2. Right ventricular systolic function is normal. The right ventricular size is normal.  3. The mitral valve is normal in structure. No evidence of mitral valve regurgitation. No evidence of mitral stenosis.  4. The aortic valve is calcified. There is mild calcification of the aortic valve. There is mild thickening of the aortic valve. Aortic valve regurgitation is not visualized. Mild aortic valve stenosis. FINDINGS  Left Ventricle: Left ventricular ejection fraction, by estimation, is 45 to 50%. The left ventricle has mildly decreased function. The left ventricle demonstrates global hypokinesis. The left ventricular internal cavity size was normal  in size. There  is  no left ventricular hypertrophy. Left ventricular diastolic function could not be evaluated due to atrial fibrillation. Left ventricular diastolic function could not be evaluated. Right Ventricle: The right ventricular size is normal. Right vetricular wall thickness was not well visualized. Right ventricular systolic function is normal. Left Atrium: Left atrial size was normal in size. Right Atrium: Right atrial size was normal in size. Pericardium: There is no evidence of pericardial effusion. Mitral Valve: The mitral valve is normal in structure. No evidence of mitral valve regurgitation. No evidence of mitral valve stenosis. Tricuspid Valve: The tricuspid valve is normal in structure. Tricuspid valve regurgitation is mild. Aortic Valve: The aortic valve is calcified. There is mild calcification of the aortic valve. There is mild thickening of the aortic valve. There is mild aortic valve annular calcification. Aortic valve regurgitation is not visualized. Mild aortic stenosis is present. Pulmonic Valve: The pulmonic valve was grossly normal. Pulmonic valve regurgitation is not visualized. Aorta: The aortic root and ascending aorta are structurally normal, with no evidence of dilitation. IAS/Shunts: The interatrial septum was not well visualized.  LEFT VENTRICLE PLAX 2D LVIDd:         4.20 cm LVIDs:         3.10 cm LV PW:         1.10 cm LV IVS:        0.90 cm LVOT diam:     2.20 cm LV SV:         38 LV SV Index:   19 LVOT Area:     3.80 cm  RIGHT VENTRICLE RV Basal diam:  4.70 cm LEFT ATRIUM             Index        RIGHT ATRIUM           Index LA diam:        4.50 cm 2.20 cm/m   RA Area:     15.60 cm LA Vol (A2C):   26.8 ml 13.12 ml/m  RA Volume:   35.40 ml  17.33 ml/m LA Vol (A4C):   33.3 ml 16.30 ml/m LA Biplane Vol: 30.7 ml 15.03 ml/m  AORTIC VALVE LVOT Vmax:   68.15 cm/s LVOT Vmean:  45.050 cm/s LVOT VTI:    0.100 m  AORTA Ao Root diam: 3.60 cm Ao Asc diam:  3.30 cm MV E velocity: 97.42 cm/s   TRICUSPID VALVE                            TR Peak grad:   20.8 mmHg                            TR Vmax:        228.00 cm/s                             SHUNTS                            Systemic VTI:  0.10 m                            Systemic Diam: 2.20 cm Mertie Moores MD Electronically signed by Mertie Moores MD Signature Date/Time: 11/11/2021/3:54:25 PM    Final  IR THORACENTESIS ASP PLEURAL SPACE W/IMG GUIDE  Result Date: 10/19/2021 INDICATION: History of colon cancer in ESRD, shortness of breath. Request for therapeutic and diagnostic thoracentesis. EXAM: ULTRASOUND GUIDED RIGHT THORACENTESIS MEDICATIONS: 15 mL 1% lidocaine COMPLICATIONS: None immediate. PROCEDURE: An ultrasound guided thoracentesis was thoroughly discussed with the patient and questions answered. The benefits, risks, alternatives and complications were also discussed. The patient understands and wishes to proceed with the procedure. Written consent was obtained. Ultrasound was performed to localize and mark an adequate pocket of fluid in the right chest. The area was then prepped and draped in the normal sterile fashion. 1% Lidocaine was used for local anesthesia. Under ultrasound guidance a 6 Fr Safe-T-Centesis catheter was introduced. Thoracentesis was performed. The catheter was removed and a dressing applied. FINDINGS: A total of approximately 650 cc of hazy amber, dark red colored fluid was removed. Samples were sent to the laboratory as requested by the clinical team. Post procedure chest X-ray reviewed, negative for pneumothorax. IMPRESSION: Successful ultrasound guided right thoracentesis yielding 650 cc of pleural fluid. Read by: Durenda Guthrie, PA-C Electronically Signed   By: Jacqulynn Cadet M.D.   On: 10/19/2021 15:12      No flowsheet data found.  No results found for: NITRICOXIDE   11/13/2021 CXR: reviewed by me and agree with radiologist's findings. Perihilar and basilar pneumonia with mild right pleural effusion  which is increased in size.    Assessment & Plan:   COPD with acute exacerbation ([place]) Symptoms likely related to COPD exacerbation, pleural effusions or both. Xopenex neb administered in clinic for scattered wheezes. Chronically in a fib but rate controlled. Recent echo did not indicate heart failure as cause of DOE or worsening symptoms. Recent thoracentesis with approx 20 mL of fluid removed per patient with no improvement in symptoms. Maintain close follow up. Could consider increasing Trelegy to 200.   Patient Instructions  Continue Trelegy inhaler 1 puff daily, rinse after  Continue albuterol inhaler 2 puffs every 4 hours as needed for shortness of breath or wheezing  Continue home O2 at 2-2.5L [place]. Notify of any increased needs.  Xopenex (levalbuterol) 3 mL nebulizer Twice daily until symptoms improve and then every 6 hours as needed for shortness of breath or wheezing. Notify of any increased heart rate.  Mucinex over the counter twice daily Chlortab 4 mg over the counter as directed at bedtime for cough  Prednisone taper pack. 4 tabs for 2 days, then 3 tabs for 2 days, 2 tabs for 2 days, then 1 tab for 2 days, then stop  Augmentin Twice daily for 7 days. Take with food. Use a probiotic over the counter daily.  Notify immediately of any rash, itching, hives, or swelling, or seek emergency care. Finish your antibiotics in their entirety. Do not stop just because symptoms improve.   Chest x ray today to evaluate previous pleural effusion and rule out any underlying infectious cause.   Pulmonary rehab referral sent. Will discuss possible repeat PFTs at next visit.  Follow up in 2 weeks with Dr. Melvyn Novas, Roxan Diesel, NP, or APP. If symptoms do not improve or worsen, please contact office for sooner follow up or seek emergency care.     Chronic respiratory failure with hypoxia ([name]) See above plan.  COPD GOLD II  See above plan. May consider addition of Singulair.   Chronic  bilateral pleural effusions  R>L See above plan.  Longstanding persistent atrial fibrillation ([place]) See above plan. Rate controlled. Follow up with cardiology  as scheduled.   Chronic diastolic heart failure ([name]) No s/s of fluid overload or acute exacerbation. See above plan. Follow up with cardiology.   Community acquired pneumonia of right lung CXR today showed perihilar and basilar opacity concerning for pneumonia. Mild right pleural effusion which is increased in size from prior exam. Continue with current plan of augmentin for 7 days. Close follow up. Return and ED precautions discussed. See above plan.     Clayton Bibles, NP 11/13/2021  Pt aware and understands NP's role.

## 2021-11-13 NOTE — Assessment & Plan Note (Addendum)
See above plan. May consider addition of Singulair.

## 2021-11-14 DIAGNOSIS — E440 Moderate protein-calorie malnutrition: Secondary | ICD-10-CM | POA: Diagnosis not present

## 2021-11-14 DIAGNOSIS — Z992 Dependence on renal dialysis: Secondary | ICD-10-CM | POA: Diagnosis not present

## 2021-11-14 DIAGNOSIS — Z4932 Encounter for adequacy testing for peritoneal dialysis: Secondary | ICD-10-CM | POA: Diagnosis not present

## 2021-11-14 DIAGNOSIS — Z79899 Other long term (current) drug therapy: Secondary | ICD-10-CM | POA: Diagnosis not present

## 2021-11-14 DIAGNOSIS — N2581 Secondary hyperparathyroidism of renal origin: Secondary | ICD-10-CM | POA: Diagnosis not present

## 2021-11-14 DIAGNOSIS — R82998 Other abnormal findings in urine: Secondary | ICD-10-CM | POA: Diagnosis not present

## 2021-11-14 DIAGNOSIS — D631 Anemia in chronic kidney disease: Secondary | ICD-10-CM | POA: Diagnosis not present

## 2021-11-14 DIAGNOSIS — D509 Iron deficiency anemia, unspecified: Secondary | ICD-10-CM | POA: Diagnosis not present

## 2021-11-14 DIAGNOSIS — N186 End stage renal disease: Secondary | ICD-10-CM | POA: Diagnosis not present

## 2021-11-14 DIAGNOSIS — K769 Liver disease, unspecified: Secondary | ICD-10-CM | POA: Diagnosis not present

## 2021-11-15 DIAGNOSIS — N186 End stage renal disease: Secondary | ICD-10-CM | POA: Diagnosis not present

## 2021-11-15 DIAGNOSIS — R82998 Other abnormal findings in urine: Secondary | ICD-10-CM | POA: Diagnosis not present

## 2021-11-15 DIAGNOSIS — K769 Liver disease, unspecified: Secondary | ICD-10-CM | POA: Diagnosis not present

## 2021-11-15 DIAGNOSIS — E440 Moderate protein-calorie malnutrition: Secondary | ICD-10-CM | POA: Diagnosis not present

## 2021-11-15 DIAGNOSIS — Z4932 Encounter for adequacy testing for peritoneal dialysis: Secondary | ICD-10-CM | POA: Diagnosis not present

## 2021-11-15 DIAGNOSIS — D509 Iron deficiency anemia, unspecified: Secondary | ICD-10-CM | POA: Diagnosis not present

## 2021-11-15 DIAGNOSIS — N2581 Secondary hyperparathyroidism of renal origin: Secondary | ICD-10-CM | POA: Diagnosis not present

## 2021-11-15 DIAGNOSIS — D631 Anemia in chronic kidney disease: Secondary | ICD-10-CM | POA: Diagnosis not present

## 2021-11-15 DIAGNOSIS — Z79899 Other long term (current) drug therapy: Secondary | ICD-10-CM | POA: Diagnosis not present

## 2021-11-15 DIAGNOSIS — Z992 Dependence on renal dialysis: Secondary | ICD-10-CM | POA: Diagnosis not present

## 2021-11-16 DIAGNOSIS — I509 Heart failure, unspecified: Secondary | ICD-10-CM | POA: Diagnosis not present

## 2021-11-16 DIAGNOSIS — Z87891 Personal history of nicotine dependence: Secondary | ICD-10-CM | POA: Diagnosis not present

## 2021-11-16 DIAGNOSIS — N186 End stage renal disease: Secondary | ICD-10-CM | POA: Diagnosis not present

## 2021-11-16 DIAGNOSIS — Z79899 Other long term (current) drug therapy: Secondary | ICD-10-CM | POA: Diagnosis not present

## 2021-11-16 DIAGNOSIS — J9611 Chronic respiratory failure with hypoxia: Secondary | ICD-10-CM | POA: Diagnosis not present

## 2021-11-16 DIAGNOSIS — R82998 Other abnormal findings in urine: Secondary | ICD-10-CM | POA: Diagnosis not present

## 2021-11-16 DIAGNOSIS — K769 Liver disease, unspecified: Secondary | ICD-10-CM | POA: Diagnosis not present

## 2021-11-16 DIAGNOSIS — Z4932 Encounter for adequacy testing for peritoneal dialysis: Secondary | ICD-10-CM | POA: Diagnosis not present

## 2021-11-16 DIAGNOSIS — I4819 Other persistent atrial fibrillation: Secondary | ICD-10-CM | POA: Diagnosis not present

## 2021-11-16 DIAGNOSIS — J449 Chronic obstructive pulmonary disease, unspecified: Secondary | ICD-10-CM | POA: Diagnosis not present

## 2021-11-16 DIAGNOSIS — K219 Gastro-esophageal reflux disease without esophagitis: Secondary | ICD-10-CM | POA: Diagnosis not present

## 2021-11-16 DIAGNOSIS — Z7951 Long term (current) use of inhaled steroids: Secondary | ICD-10-CM | POA: Diagnosis not present

## 2021-11-16 DIAGNOSIS — E440 Moderate protein-calorie malnutrition: Secondary | ICD-10-CM | POA: Diagnosis not present

## 2021-11-16 DIAGNOSIS — Z992 Dependence on renal dialysis: Secondary | ICD-10-CM | POA: Diagnosis not present

## 2021-11-16 DIAGNOSIS — J9612 Chronic respiratory failure with hypercapnia: Secondary | ICD-10-CM | POA: Diagnosis not present

## 2021-11-16 DIAGNOSIS — Z7901 Long term (current) use of anticoagulants: Secondary | ICD-10-CM | POA: Diagnosis not present

## 2021-11-16 DIAGNOSIS — Z9181 History of falling: Secondary | ICD-10-CM | POA: Diagnosis not present

## 2021-11-16 DIAGNOSIS — D631 Anemia in chronic kidney disease: Secondary | ICD-10-CM | POA: Diagnosis not present

## 2021-11-16 DIAGNOSIS — Z9981 Dependence on supplemental oxygen: Secondary | ICD-10-CM | POA: Diagnosis not present

## 2021-11-16 DIAGNOSIS — D509 Iron deficiency anemia, unspecified: Secondary | ICD-10-CM | POA: Diagnosis not present

## 2021-11-16 DIAGNOSIS — N2581 Secondary hyperparathyroidism of renal origin: Secondary | ICD-10-CM | POA: Diagnosis not present

## 2021-11-16 DIAGNOSIS — J90 Pleural effusion, not elsewhere classified: Secondary | ICD-10-CM | POA: Diagnosis not present

## 2021-11-16 MED ORDER — LEVALBUTEROL HCL 0.63 MG/3ML IN NEBU
0.6300 mg | INHALATION_SOLUTION | Freq: Every day | RESPIRATORY_TRACT | Status: AC
  Administered 2021-11-13: 12:00:00 0.63 mg via RESPIRATORY_TRACT

## 2021-11-16 NOTE — Addendum Note (Signed)
Addended by: Vanessa Barbara on: 11/16/2021 11:42 AM   Modules accepted: Orders

## 2021-11-17 DIAGNOSIS — Z4932 Encounter for adequacy testing for peritoneal dialysis: Secondary | ICD-10-CM | POA: Diagnosis not present

## 2021-11-17 DIAGNOSIS — Z79899 Other long term (current) drug therapy: Secondary | ICD-10-CM | POA: Diagnosis not present

## 2021-11-17 DIAGNOSIS — D509 Iron deficiency anemia, unspecified: Secondary | ICD-10-CM | POA: Diagnosis not present

## 2021-11-17 DIAGNOSIS — N186 End stage renal disease: Secondary | ICD-10-CM | POA: Diagnosis not present

## 2021-11-17 DIAGNOSIS — E440 Moderate protein-calorie malnutrition: Secondary | ICD-10-CM | POA: Diagnosis not present

## 2021-11-17 DIAGNOSIS — D631 Anemia in chronic kidney disease: Secondary | ICD-10-CM | POA: Diagnosis not present

## 2021-11-17 DIAGNOSIS — R82998 Other abnormal findings in urine: Secondary | ICD-10-CM | POA: Diagnosis not present

## 2021-11-17 DIAGNOSIS — Z992 Dependence on renal dialysis: Secondary | ICD-10-CM | POA: Diagnosis not present

## 2021-11-17 DIAGNOSIS — K769 Liver disease, unspecified: Secondary | ICD-10-CM | POA: Diagnosis not present

## 2021-11-17 DIAGNOSIS — N2581 Secondary hyperparathyroidism of renal origin: Secondary | ICD-10-CM | POA: Diagnosis not present

## 2021-11-18 DIAGNOSIS — E440 Moderate protein-calorie malnutrition: Secondary | ICD-10-CM | POA: Diagnosis not present

## 2021-11-18 DIAGNOSIS — N2581 Secondary hyperparathyroidism of renal origin: Secondary | ICD-10-CM | POA: Diagnosis not present

## 2021-11-18 DIAGNOSIS — D631 Anemia in chronic kidney disease: Secondary | ICD-10-CM | POA: Diagnosis not present

## 2021-11-18 DIAGNOSIS — Z79899 Other long term (current) drug therapy: Secondary | ICD-10-CM | POA: Diagnosis not present

## 2021-11-18 DIAGNOSIS — D509 Iron deficiency anemia, unspecified: Secondary | ICD-10-CM | POA: Diagnosis not present

## 2021-11-18 DIAGNOSIS — N186 End stage renal disease: Secondary | ICD-10-CM | POA: Diagnosis not present

## 2021-11-18 DIAGNOSIS — Z992 Dependence on renal dialysis: Secondary | ICD-10-CM | POA: Diagnosis not present

## 2021-11-18 DIAGNOSIS — K769 Liver disease, unspecified: Secondary | ICD-10-CM | POA: Diagnosis not present

## 2021-11-18 DIAGNOSIS — Z4932 Encounter for adequacy testing for peritoneal dialysis: Secondary | ICD-10-CM | POA: Diagnosis not present

## 2021-11-18 DIAGNOSIS — R82998 Other abnormal findings in urine: Secondary | ICD-10-CM | POA: Diagnosis not present

## 2021-11-18 DIAGNOSIS — I129 Hypertensive chronic kidney disease with stage 1 through stage 4 chronic kidney disease, or unspecified chronic kidney disease: Secondary | ICD-10-CM | POA: Diagnosis not present

## 2021-11-19 DIAGNOSIS — R82998 Other abnormal findings in urine: Secondary | ICD-10-CM | POA: Diagnosis not present

## 2021-11-19 DIAGNOSIS — Z992 Dependence on renal dialysis: Secondary | ICD-10-CM | POA: Diagnosis not present

## 2021-11-19 DIAGNOSIS — E440 Moderate protein-calorie malnutrition: Secondary | ICD-10-CM | POA: Diagnosis not present

## 2021-11-19 DIAGNOSIS — Z4932 Encounter for adequacy testing for peritoneal dialysis: Secondary | ICD-10-CM | POA: Diagnosis not present

## 2021-11-19 DIAGNOSIS — N186 End stage renal disease: Secondary | ICD-10-CM | POA: Diagnosis not present

## 2021-11-19 DIAGNOSIS — D509 Iron deficiency anemia, unspecified: Secondary | ICD-10-CM | POA: Diagnosis not present

## 2021-11-19 DIAGNOSIS — N2581 Secondary hyperparathyroidism of renal origin: Secondary | ICD-10-CM | POA: Diagnosis not present

## 2021-11-19 DIAGNOSIS — Z79899 Other long term (current) drug therapy: Secondary | ICD-10-CM | POA: Diagnosis not present

## 2021-11-19 DIAGNOSIS — K769 Liver disease, unspecified: Secondary | ICD-10-CM | POA: Diagnosis not present

## 2021-11-19 DIAGNOSIS — D631 Anemia in chronic kidney disease: Secondary | ICD-10-CM | POA: Diagnosis not present

## 2021-11-20 DIAGNOSIS — K769 Liver disease, unspecified: Secondary | ICD-10-CM | POA: Diagnosis not present

## 2021-11-20 DIAGNOSIS — D509 Iron deficiency anemia, unspecified: Secondary | ICD-10-CM | POA: Diagnosis not present

## 2021-11-20 DIAGNOSIS — D631 Anemia in chronic kidney disease: Secondary | ICD-10-CM | POA: Diagnosis not present

## 2021-11-20 DIAGNOSIS — Z79899 Other long term (current) drug therapy: Secondary | ICD-10-CM | POA: Diagnosis not present

## 2021-11-20 DIAGNOSIS — N2581 Secondary hyperparathyroidism of renal origin: Secondary | ICD-10-CM | POA: Diagnosis not present

## 2021-11-20 DIAGNOSIS — Z4932 Encounter for adequacy testing for peritoneal dialysis: Secondary | ICD-10-CM | POA: Diagnosis not present

## 2021-11-20 DIAGNOSIS — Z992 Dependence on renal dialysis: Secondary | ICD-10-CM | POA: Diagnosis not present

## 2021-11-20 DIAGNOSIS — E440 Moderate protein-calorie malnutrition: Secondary | ICD-10-CM | POA: Diagnosis not present

## 2021-11-20 DIAGNOSIS — R82998 Other abnormal findings in urine: Secondary | ICD-10-CM | POA: Diagnosis not present

## 2021-11-20 DIAGNOSIS — N186 End stage renal disease: Secondary | ICD-10-CM | POA: Diagnosis not present

## 2021-11-21 DIAGNOSIS — D631 Anemia in chronic kidney disease: Secondary | ICD-10-CM | POA: Diagnosis not present

## 2021-11-21 DIAGNOSIS — K769 Liver disease, unspecified: Secondary | ICD-10-CM | POA: Diagnosis not present

## 2021-11-21 DIAGNOSIS — Z992 Dependence on renal dialysis: Secondary | ICD-10-CM | POA: Diagnosis not present

## 2021-11-21 DIAGNOSIS — E440 Moderate protein-calorie malnutrition: Secondary | ICD-10-CM | POA: Diagnosis not present

## 2021-11-21 DIAGNOSIS — Z4932 Encounter for adequacy testing for peritoneal dialysis: Secondary | ICD-10-CM | POA: Diagnosis not present

## 2021-11-21 DIAGNOSIS — R82998 Other abnormal findings in urine: Secondary | ICD-10-CM | POA: Diagnosis not present

## 2021-11-21 DIAGNOSIS — N2581 Secondary hyperparathyroidism of renal origin: Secondary | ICD-10-CM | POA: Diagnosis not present

## 2021-11-21 DIAGNOSIS — Z79899 Other long term (current) drug therapy: Secondary | ICD-10-CM | POA: Diagnosis not present

## 2021-11-21 DIAGNOSIS — N186 End stage renal disease: Secondary | ICD-10-CM | POA: Diagnosis not present

## 2021-11-21 DIAGNOSIS — D509 Iron deficiency anemia, unspecified: Secondary | ICD-10-CM | POA: Diagnosis not present

## 2021-11-22 DIAGNOSIS — Z992 Dependence on renal dialysis: Secondary | ICD-10-CM | POA: Diagnosis not present

## 2021-11-22 DIAGNOSIS — K769 Liver disease, unspecified: Secondary | ICD-10-CM | POA: Diagnosis not present

## 2021-11-22 DIAGNOSIS — N186 End stage renal disease: Secondary | ICD-10-CM | POA: Diagnosis not present

## 2021-11-22 DIAGNOSIS — Z79899 Other long term (current) drug therapy: Secondary | ICD-10-CM | POA: Diagnosis not present

## 2021-11-22 DIAGNOSIS — R82998 Other abnormal findings in urine: Secondary | ICD-10-CM | POA: Diagnosis not present

## 2021-11-22 DIAGNOSIS — Z4932 Encounter for adequacy testing for peritoneal dialysis: Secondary | ICD-10-CM | POA: Diagnosis not present

## 2021-11-22 DIAGNOSIS — E440 Moderate protein-calorie malnutrition: Secondary | ICD-10-CM | POA: Diagnosis not present

## 2021-11-22 DIAGNOSIS — N2581 Secondary hyperparathyroidism of renal origin: Secondary | ICD-10-CM | POA: Diagnosis not present

## 2021-11-22 DIAGNOSIS — D509 Iron deficiency anemia, unspecified: Secondary | ICD-10-CM | POA: Diagnosis not present

## 2021-11-22 DIAGNOSIS — D631 Anemia in chronic kidney disease: Secondary | ICD-10-CM | POA: Diagnosis not present

## 2021-11-23 DIAGNOSIS — K219 Gastro-esophageal reflux disease without esophagitis: Secondary | ICD-10-CM | POA: Diagnosis not present

## 2021-11-23 DIAGNOSIS — N186 End stage renal disease: Secondary | ICD-10-CM | POA: Diagnosis not present

## 2021-11-23 DIAGNOSIS — Z9981 Dependence on supplemental oxygen: Secondary | ICD-10-CM | POA: Diagnosis not present

## 2021-11-23 DIAGNOSIS — J449 Chronic obstructive pulmonary disease, unspecified: Secondary | ICD-10-CM | POA: Diagnosis not present

## 2021-11-23 DIAGNOSIS — K769 Liver disease, unspecified: Secondary | ICD-10-CM | POA: Diagnosis not present

## 2021-11-23 DIAGNOSIS — I4819 Other persistent atrial fibrillation: Secondary | ICD-10-CM | POA: Diagnosis not present

## 2021-11-23 DIAGNOSIS — D509 Iron deficiency anemia, unspecified: Secondary | ICD-10-CM | POA: Diagnosis not present

## 2021-11-23 DIAGNOSIS — Z79899 Other long term (current) drug therapy: Secondary | ICD-10-CM | POA: Diagnosis not present

## 2021-11-23 DIAGNOSIS — N2581 Secondary hyperparathyroidism of renal origin: Secondary | ICD-10-CM | POA: Diagnosis not present

## 2021-11-23 DIAGNOSIS — J9611 Chronic respiratory failure with hypoxia: Secondary | ICD-10-CM | POA: Diagnosis not present

## 2021-11-23 DIAGNOSIS — I509 Heart failure, unspecified: Secondary | ICD-10-CM | POA: Diagnosis not present

## 2021-11-23 DIAGNOSIS — J9612 Chronic respiratory failure with hypercapnia: Secondary | ICD-10-CM | POA: Diagnosis not present

## 2021-11-23 DIAGNOSIS — Z4932 Encounter for adequacy testing for peritoneal dialysis: Secondary | ICD-10-CM | POA: Diagnosis not present

## 2021-11-23 DIAGNOSIS — J90 Pleural effusion, not elsewhere classified: Secondary | ICD-10-CM | POA: Diagnosis not present

## 2021-11-23 DIAGNOSIS — Z9181 History of falling: Secondary | ICD-10-CM | POA: Diagnosis not present

## 2021-11-23 DIAGNOSIS — Z87891 Personal history of nicotine dependence: Secondary | ICD-10-CM | POA: Diagnosis not present

## 2021-11-23 DIAGNOSIS — Z992 Dependence on renal dialysis: Secondary | ICD-10-CM | POA: Diagnosis not present

## 2021-11-23 DIAGNOSIS — R82998 Other abnormal findings in urine: Secondary | ICD-10-CM | POA: Diagnosis not present

## 2021-11-23 DIAGNOSIS — D631 Anemia in chronic kidney disease: Secondary | ICD-10-CM | POA: Diagnosis not present

## 2021-11-23 DIAGNOSIS — Z7951 Long term (current) use of inhaled steroids: Secondary | ICD-10-CM | POA: Diagnosis not present

## 2021-11-23 DIAGNOSIS — E440 Moderate protein-calorie malnutrition: Secondary | ICD-10-CM | POA: Diagnosis not present

## 2021-11-23 DIAGNOSIS — Z7901 Long term (current) use of anticoagulants: Secondary | ICD-10-CM | POA: Diagnosis not present

## 2021-11-24 DIAGNOSIS — N2581 Secondary hyperparathyroidism of renal origin: Secondary | ICD-10-CM | POA: Diagnosis not present

## 2021-11-24 DIAGNOSIS — D631 Anemia in chronic kidney disease: Secondary | ICD-10-CM | POA: Diagnosis not present

## 2021-11-24 DIAGNOSIS — R82998 Other abnormal findings in urine: Secondary | ICD-10-CM | POA: Diagnosis not present

## 2021-11-24 DIAGNOSIS — K769 Liver disease, unspecified: Secondary | ICD-10-CM | POA: Diagnosis not present

## 2021-11-24 DIAGNOSIS — E440 Moderate protein-calorie malnutrition: Secondary | ICD-10-CM | POA: Diagnosis not present

## 2021-11-24 DIAGNOSIS — D509 Iron deficiency anemia, unspecified: Secondary | ICD-10-CM | POA: Diagnosis not present

## 2021-11-24 DIAGNOSIS — N186 End stage renal disease: Secondary | ICD-10-CM | POA: Diagnosis not present

## 2021-11-24 DIAGNOSIS — Z79899 Other long term (current) drug therapy: Secondary | ICD-10-CM | POA: Diagnosis not present

## 2021-11-24 DIAGNOSIS — Z4932 Encounter for adequacy testing for peritoneal dialysis: Secondary | ICD-10-CM | POA: Diagnosis not present

## 2021-11-24 DIAGNOSIS — Z992 Dependence on renal dialysis: Secondary | ICD-10-CM | POA: Diagnosis not present

## 2021-11-25 DIAGNOSIS — Z992 Dependence on renal dialysis: Secondary | ICD-10-CM | POA: Diagnosis not present

## 2021-11-25 DIAGNOSIS — Z79899 Other long term (current) drug therapy: Secondary | ICD-10-CM | POA: Diagnosis not present

## 2021-11-25 DIAGNOSIS — N186 End stage renal disease: Secondary | ICD-10-CM | POA: Diagnosis not present

## 2021-11-25 DIAGNOSIS — N2581 Secondary hyperparathyroidism of renal origin: Secondary | ICD-10-CM | POA: Diagnosis not present

## 2021-11-25 DIAGNOSIS — D631 Anemia in chronic kidney disease: Secondary | ICD-10-CM | POA: Diagnosis not present

## 2021-11-25 DIAGNOSIS — D509 Iron deficiency anemia, unspecified: Secondary | ICD-10-CM | POA: Diagnosis not present

## 2021-11-25 DIAGNOSIS — Z4932 Encounter for adequacy testing for peritoneal dialysis: Secondary | ICD-10-CM | POA: Diagnosis not present

## 2021-11-25 DIAGNOSIS — K769 Liver disease, unspecified: Secondary | ICD-10-CM | POA: Diagnosis not present

## 2021-11-25 DIAGNOSIS — E440 Moderate protein-calorie malnutrition: Secondary | ICD-10-CM | POA: Diagnosis not present

## 2021-11-25 DIAGNOSIS — R82998 Other abnormal findings in urine: Secondary | ICD-10-CM | POA: Diagnosis not present

## 2021-11-26 ENCOUNTER — Encounter (HOSPITAL_COMMUNITY): Payer: Self-pay | Admitting: *Deleted

## 2021-11-26 DIAGNOSIS — R82998 Other abnormal findings in urine: Secondary | ICD-10-CM | POA: Diagnosis not present

## 2021-11-26 DIAGNOSIS — Z992 Dependence on renal dialysis: Secondary | ICD-10-CM | POA: Diagnosis not present

## 2021-11-26 DIAGNOSIS — N2581 Secondary hyperparathyroidism of renal origin: Secondary | ICD-10-CM | POA: Diagnosis not present

## 2021-11-26 DIAGNOSIS — D631 Anemia in chronic kidney disease: Secondary | ICD-10-CM | POA: Diagnosis not present

## 2021-11-26 DIAGNOSIS — E440 Moderate protein-calorie malnutrition: Secondary | ICD-10-CM | POA: Diagnosis not present

## 2021-11-26 DIAGNOSIS — N186 End stage renal disease: Secondary | ICD-10-CM | POA: Diagnosis not present

## 2021-11-26 DIAGNOSIS — Z4932 Encounter for adequacy testing for peritoneal dialysis: Secondary | ICD-10-CM | POA: Diagnosis not present

## 2021-11-26 DIAGNOSIS — D509 Iron deficiency anemia, unspecified: Secondary | ICD-10-CM | POA: Diagnosis not present

## 2021-11-26 DIAGNOSIS — K769 Liver disease, unspecified: Secondary | ICD-10-CM | POA: Diagnosis not present

## 2021-11-26 DIAGNOSIS — Z79899 Other long term (current) drug therapy: Secondary | ICD-10-CM | POA: Diagnosis not present

## 2021-11-26 NOTE — Progress Notes (Signed)
Received referral from Dr. Melvyn Novas for this pt to participate in pulmonary rehab with the diagnosis of COPD Stage II. Pt had PFT in 2014- FEV1/FVC 55, FEV1 post pred 71 Clinical review of pt follow up appt on  11/25 with Marland Kitchen NP Pulmonary office note.  Pt with Covid Risk Score - 8. Pt appropriate for scheduling for Pulmonary rehab.  Will forward to support staff for scheduling and verification of insurance eligibility/benefits with pt consent. Cherre Huger, BSN Cardiac and Training and development officer

## 2021-11-27 ENCOUNTER — Other Ambulatory Visit: Payer: Self-pay

## 2021-11-27 ENCOUNTER — Encounter: Payer: Self-pay | Admitting: Nurse Practitioner

## 2021-11-27 ENCOUNTER — Emergency Department (HOSPITAL_BASED_OUTPATIENT_CLINIC_OR_DEPARTMENT_OTHER): Payer: Medicare Other

## 2021-11-27 ENCOUNTER — Ambulatory Visit (INDEPENDENT_AMBULATORY_CARE_PROVIDER_SITE_OTHER): Payer: Medicare Other | Admitting: Nurse Practitioner

## 2021-11-27 ENCOUNTER — Encounter (HOSPITAL_BASED_OUTPATIENT_CLINIC_OR_DEPARTMENT_OTHER): Payer: Self-pay | Admitting: Emergency Medicine

## 2021-11-27 ENCOUNTER — Inpatient Hospital Stay (HOSPITAL_BASED_OUTPATIENT_CLINIC_OR_DEPARTMENT_OTHER)
Admission: EM | Admit: 2021-11-27 | Discharge: 2021-12-04 | DRG: 193 | Disposition: A | Discharge status: Home Health | Payer: Medicare Other | Attending: Internal Medicine | Admitting: Internal Medicine

## 2021-11-27 ENCOUNTER — Ambulatory Visit (INDEPENDENT_AMBULATORY_CARE_PROVIDER_SITE_OTHER): Payer: Medicare Other

## 2021-11-27 VITALS — BP 102/54 | HR 117 | Temp 97.7°F | Ht 70.0 in | Wt 186.1 lb

## 2021-11-27 DIAGNOSIS — I4811 Longstanding persistent atrial fibrillation: Secondary | ICD-10-CM | POA: Diagnosis present

## 2021-11-27 DIAGNOSIS — I5032 Chronic diastolic (congestive) heart failure: Secondary | ICD-10-CM | POA: Diagnosis present

## 2021-11-27 DIAGNOSIS — J188 Other pneumonia, unspecified organism: Secondary | ICD-10-CM | POA: Diagnosis present

## 2021-11-27 DIAGNOSIS — K5900 Constipation, unspecified: Secondary | ICD-10-CM | POA: Diagnosis present

## 2021-11-27 DIAGNOSIS — J441 Chronic obstructive pulmonary disease with (acute) exacerbation: Secondary | ICD-10-CM | POA: Diagnosis not present

## 2021-11-27 DIAGNOSIS — D631 Anemia in chronic kidney disease: Secondary | ICD-10-CM | POA: Diagnosis not present

## 2021-11-27 DIAGNOSIS — J9622 Acute and chronic respiratory failure with hypercapnia: Secondary | ICD-10-CM | POA: Diagnosis present

## 2021-11-27 DIAGNOSIS — J189 Pneumonia, unspecified organism: Secondary | ICD-10-CM

## 2021-11-27 DIAGNOSIS — J9611 Chronic respiratory failure with hypoxia: Secondary | ICD-10-CM | POA: Diagnosis present

## 2021-11-27 DIAGNOSIS — R590 Localized enlarged lymph nodes: Secondary | ICD-10-CM | POA: Diagnosis not present

## 2021-11-27 DIAGNOSIS — I700 Atherosclerosis of aorta: Secondary | ICD-10-CM | POA: Diagnosis not present

## 2021-11-27 DIAGNOSIS — J9621 Acute and chronic respiratory failure with hypoxia: Secondary | ICD-10-CM | POA: Diagnosis present

## 2021-11-27 DIAGNOSIS — I10 Essential (primary) hypertension: Secondary | ICD-10-CM | POA: Diagnosis present

## 2021-11-27 DIAGNOSIS — R197 Diarrhea, unspecified: Secondary | ICD-10-CM | POA: Diagnosis present

## 2021-11-27 DIAGNOSIS — J449 Chronic obstructive pulmonary disease, unspecified: Secondary | ICD-10-CM | POA: Diagnosis not present

## 2021-11-27 DIAGNOSIS — E876 Hypokalemia: Secondary | ICD-10-CM | POA: Diagnosis present

## 2021-11-27 DIAGNOSIS — D649 Anemia, unspecified: Secondary | ICD-10-CM | POA: Diagnosis not present

## 2021-11-27 DIAGNOSIS — Z87891 Personal history of nicotine dependence: Secondary | ICD-10-CM

## 2021-11-27 DIAGNOSIS — N186 End stage renal disease: Secondary | ICD-10-CM | POA: Diagnosis not present

## 2021-11-27 DIAGNOSIS — Z8701 Personal history of pneumonia (recurrent): Secondary | ICD-10-CM | POA: Diagnosis not present

## 2021-11-27 DIAGNOSIS — G4733 Obstructive sleep apnea (adult) (pediatric): Secondary | ICD-10-CM | POA: Diagnosis present

## 2021-11-27 DIAGNOSIS — C3490 Malignant neoplasm of unspecified part of unspecified bronchus or lung: Secondary | ICD-10-CM | POA: Diagnosis not present

## 2021-11-27 DIAGNOSIS — Z9981 Dependence on supplemental oxygen: Secondary | ICD-10-CM

## 2021-11-27 DIAGNOSIS — E872 Acidosis, unspecified: Secondary | ICD-10-CM | POA: Diagnosis not present

## 2021-11-27 DIAGNOSIS — R0602 Shortness of breath: Secondary | ICD-10-CM | POA: Diagnosis not present

## 2021-11-27 DIAGNOSIS — Z6827 Body mass index (BMI) 27.0-27.9, adult: Secondary | ICD-10-CM

## 2021-11-27 DIAGNOSIS — Z7901 Long term (current) use of anticoagulants: Secondary | ICD-10-CM

## 2021-11-27 DIAGNOSIS — Z7189 Other specified counseling: Secondary | ICD-10-CM | POA: Diagnosis not present

## 2021-11-27 DIAGNOSIS — E871 Hypo-osmolality and hyponatremia: Secondary | ICD-10-CM | POA: Diagnosis not present

## 2021-11-27 DIAGNOSIS — J90 Pleural effusion, not elsewhere classified: Secondary | ICD-10-CM

## 2021-11-27 DIAGNOSIS — M4856XA Collapsed vertebra, not elsewhere classified, lumbar region, initial encounter for fracture: Secondary | ICD-10-CM | POA: Diagnosis not present

## 2021-11-27 DIAGNOSIS — J9612 Chronic respiratory failure with hypercapnia: Secondary | ICD-10-CM | POA: Diagnosis present

## 2021-11-27 DIAGNOSIS — F1010 Alcohol abuse, uncomplicated: Secondary | ICD-10-CM | POA: Diagnosis present

## 2021-11-27 DIAGNOSIS — I5042 Chronic combined systolic (congestive) and diastolic (congestive) heart failure: Secondary | ICD-10-CM | POA: Diagnosis present

## 2021-11-27 DIAGNOSIS — Z20822 Contact with and (suspected) exposure to covid-19: Secondary | ICD-10-CM | POA: Diagnosis present

## 2021-11-27 DIAGNOSIS — S32010A Wedge compression fracture of first lumbar vertebra, initial encounter for closed fracture: Secondary | ICD-10-CM

## 2021-11-27 DIAGNOSIS — E8809 Other disorders of plasma-protein metabolism, not elsewhere classified: Secondary | ICD-10-CM | POA: Diagnosis present

## 2021-11-27 DIAGNOSIS — J439 Emphysema, unspecified: Secondary | ICD-10-CM | POA: Diagnosis not present

## 2021-11-27 DIAGNOSIS — Z85038 Personal history of other malignant neoplasm of large intestine: Secondary | ICD-10-CM

## 2021-11-27 DIAGNOSIS — I4821 Permanent atrial fibrillation: Secondary | ICD-10-CM | POA: Diagnosis present

## 2021-11-27 DIAGNOSIS — N2581 Secondary hyperparathyroidism of renal origin: Secondary | ICD-10-CM | POA: Diagnosis present

## 2021-11-27 DIAGNOSIS — Z800 Family history of malignant neoplasm of digestive organs: Secondary | ICD-10-CM

## 2021-11-27 DIAGNOSIS — K922 Gastrointestinal hemorrhage, unspecified: Secondary | ICD-10-CM | POA: Diagnosis not present

## 2021-11-27 DIAGNOSIS — I132 Hypertensive heart and chronic kidney disease with heart failure and with stage 5 chronic kidney disease, or end stage renal disease: Secondary | ICD-10-CM | POA: Diagnosis not present

## 2021-11-27 DIAGNOSIS — R627 Adult failure to thrive: Secondary | ICD-10-CM | POA: Diagnosis present

## 2021-11-27 DIAGNOSIS — Z801 Family history of malignant neoplasm of trachea, bronchus and lung: Secondary | ICD-10-CM

## 2021-11-27 DIAGNOSIS — J9811 Atelectasis: Secondary | ICD-10-CM | POA: Diagnosis not present

## 2021-11-27 DIAGNOSIS — J9809 Other diseases of bronchus, not elsewhere classified: Secondary | ICD-10-CM | POA: Diagnosis not present

## 2021-11-27 DIAGNOSIS — I5043 Acute on chronic combined systolic (congestive) and diastolic (congestive) heart failure: Secondary | ICD-10-CM | POA: Diagnosis not present

## 2021-11-27 DIAGNOSIS — C801 Malignant (primary) neoplasm, unspecified: Secondary | ICD-10-CM | POA: Diagnosis not present

## 2021-11-27 DIAGNOSIS — E778 Other disorders of glycoprotein metabolism: Secondary | ICD-10-CM | POA: Diagnosis not present

## 2021-11-27 DIAGNOSIS — Z888 Allergy status to other drugs, medicaments and biological substances status: Secondary | ICD-10-CM

## 2021-11-27 DIAGNOSIS — I959 Hypotension, unspecified: Secondary | ICD-10-CM | POA: Diagnosis present

## 2021-11-27 DIAGNOSIS — Z992 Dependence on renal dialysis: Secondary | ICD-10-CM

## 2021-11-27 DIAGNOSIS — M4316 Spondylolisthesis, lumbar region: Secondary | ICD-10-CM | POA: Diagnosis not present

## 2021-11-27 DIAGNOSIS — D62 Acute posthemorrhagic anemia: Secondary | ICD-10-CM | POA: Diagnosis present

## 2021-11-27 DIAGNOSIS — J9610 Chronic respiratory failure, unspecified whether with hypoxia or hypercapnia: Secondary | ICD-10-CM | POA: Diagnosis not present

## 2021-11-27 DIAGNOSIS — R918 Other nonspecific abnormal finding of lung field: Secondary | ICD-10-CM | POA: Diagnosis not present

## 2021-11-27 DIAGNOSIS — Z79899 Other long term (current) drug therapy: Secondary | ICD-10-CM

## 2021-11-27 DIAGNOSIS — Z7951 Long term (current) use of inhaled steroids: Secondary | ICD-10-CM

## 2021-11-27 DIAGNOSIS — R9389 Abnormal findings on diagnostic imaging of other specified body structures: Secondary | ICD-10-CM | POA: Diagnosis not present

## 2021-11-27 DIAGNOSIS — R54 Age-related physical debility: Secondary | ICD-10-CM | POA: Diagnosis present

## 2021-11-27 DIAGNOSIS — Z66 Do not resuscitate: Secondary | ICD-10-CM | POA: Diagnosis not present

## 2021-11-27 DIAGNOSIS — Z515 Encounter for palliative care: Secondary | ICD-10-CM | POA: Diagnosis not present

## 2021-11-27 DIAGNOSIS — I5020 Unspecified systolic (congestive) heart failure: Secondary | ICD-10-CM | POA: Diagnosis not present

## 2021-11-27 HISTORY — DX: Enterocolitis due to Clostridium difficile, not specified as recurrent: A04.72

## 2021-11-27 LAB — CBC WITH DIFFERENTIAL/PLATELET
Abs Immature Granulocytes: 0.06 10*3/uL (ref 0.00–0.07)
Basophils Absolute: 0 10*3/uL (ref 0.0–0.1)
Basophils Relative: 0 %
Eosinophils Absolute: 0.1 10*3/uL (ref 0.0–0.5)
Eosinophils Relative: 1 %
HCT: 25.2 % — ABNORMAL LOW (ref 39.0–52.0)
Hemoglobin: 8.2 g/dL — ABNORMAL LOW (ref 13.0–17.0)
Immature Granulocytes: 1 %
Lymphocytes Relative: 8 %
Lymphs Abs: 0.9 10*3/uL (ref 0.7–4.0)
MCH: 34.3 pg — ABNORMAL HIGH (ref 26.0–34.0)
MCHC: 32.5 g/dL (ref 30.0–36.0)
MCV: 105.4 fL — ABNORMAL HIGH (ref 80.0–100.0)
Monocytes Absolute: 1.3 10*3/uL — ABNORMAL HIGH (ref 0.1–1.0)
Monocytes Relative: 11 %
Neutro Abs: 9.6 10*3/uL — ABNORMAL HIGH (ref 1.7–7.7)
Neutrophils Relative %: 79 %
Platelets: 182 10*3/uL (ref 150–400)
RBC: 2.39 MIL/uL — ABNORMAL LOW (ref 4.22–5.81)
RDW: 16.7 % — ABNORMAL HIGH (ref 11.5–15.5)
WBC: 12 10*3/uL — ABNORMAL HIGH (ref 4.0–10.5)
nRBC: 0 % (ref 0.0–0.2)

## 2021-11-27 LAB — COMPREHENSIVE METABOLIC PANEL
ALT: 10 U/L (ref 0–44)
AST: 21 U/L (ref 15–41)
Albumin: 3.1 g/dL — ABNORMAL LOW (ref 3.5–5.0)
Alkaline Phosphatase: 72 U/L (ref 38–126)
Anion gap: 16 — ABNORMAL HIGH (ref 5–15)
BUN: 55 mg/dL — ABNORMAL HIGH (ref 8–23)
CO2: 26 mmol/L (ref 22–32)
Calcium: 9.1 mg/dL (ref 8.9–10.3)
Chloride: 93 mmol/L — ABNORMAL LOW (ref 98–111)
Creatinine, Ser: 7.24 mg/dL — ABNORMAL HIGH (ref 0.61–1.24)
GFR, Estimated: 7 mL/min — ABNORMAL LOW (ref >60)
Glucose, Bld: 92 mg/dL (ref 70–99)
Potassium: 3.1 mmol/L — ABNORMAL LOW (ref 3.5–5.1)
Sodium: 135 mmol/L (ref 135–145)
Total Bilirubin: 0.5 mg/dL (ref 0.3–1.2)
Total Protein: 6 g/dL — ABNORMAL LOW (ref 6.5–8.1)

## 2021-11-27 LAB — RESP PANEL BY RT-PCR (FLU A&B, COVID) ARPGX2
Influenza A by PCR: NEGATIVE
Influenza B by PCR: NEGATIVE
SARS Coronavirus 2 by RT PCR: NEGATIVE

## 2021-11-27 LAB — LACTIC ACID, PLASMA: Lactic Acid, Venous: 1.3 mmol/L (ref 0.5–1.9)

## 2021-11-27 LAB — OCCULT BLOOD X 1 CARD TO LAB, STOOL: Fecal Occult Bld: POSITIVE — AB

## 2021-11-27 MED ORDER — SODIUM CHLORIDE 0.9 % IV SOLN
500.0000 mg | Freq: Once | INTRAVENOUS | Status: AC
  Administered 2021-11-27: 500 mg via INTRAVENOUS
  Filled 2021-11-27: qty 5

## 2021-11-27 MED ORDER — IPRATROPIUM-ALBUTEROL 0.5-2.5 (3) MG/3ML IN SOLN
3.0000 mL | RESPIRATORY_TRACT | Status: DC | PRN
  Administered 2021-11-27: 3 mL via RESPIRATORY_TRACT
  Filled 2021-11-27: qty 3

## 2021-11-27 MED ORDER — SODIUM CHLORIDE 0.9 % IV SOLN
1.0000 g | Freq: Once | INTRAVENOUS | Status: AC
  Administered 2021-11-27: 1 g via INTRAVENOUS
  Filled 2021-11-27: qty 10

## 2021-11-27 NOTE — ED Triage Notes (Signed)
Pt arrives pov, to triage in wheelchair, c/o shob. Pt dx with pneumonia x 2 weeks pta, treatment with abx, no improvement. Reports lower back pain. Pt use 2 L o2 at home, having to increase to 3 L.

## 2021-11-27 NOTE — ED Provider Notes (Signed)
[facility] EMERGENCY DEPT Provider Note   CSN: [id number] Arrival date & time: 11/27/21  1645     History Chief Complaint  Patient presents with   Shortness of [place] Madera is a 87 y.o. male.   Shortness of Breath Associated symptoms: cough   Associated symptoms: no abdominal pain, no fever and no rash   Patient sent in from pulmonology.  Shortness of breath.  Has had chronic respiratory failure with COPD.  On around 2 L at baseline.  Around 2 weeks ago was treated by PCP for pneumonia on x-ray.  Had a 7-day course of Augmentin also been on prednisone.  Not really improved at all.  In fact now is doing worse both with his respiratory status although he states that is not that much worthless and back pain.  States he cannot walk and cannot get up and move around due to this low back pain.  Has had some low back pain before but never this severe.  No fevers.  Has been coughing with some yellow sputum production.  X-ray done today showed persistent lower lobe pneumonia with new upper lobe pneumonia.  Plan for IV antibiotics and admission.  No fall.  To home peritoneal dialysis patient  Past Medical History:  Diagnosis Date   Alcohol abuse    Colon cancer ([place])    COPD (chronic obstructive pulmonary disease) ([place])    Emphysema of lung ([place])    ESRD (end stage renal disease) ([place])    Former tobacco use    Hypertension    Peripheral edema    Sepsis ([place]) 10/2016   Aspiration PNA/C diff colitis    Patient Active Problem List   Diagnosis Date Noted   Community acquired pneumonia of right lung 11/13/2021   Medication management 10/17/2020   Healthcare maintenance 10/17/2020   Musculoskeletal back pain 09/17/2020   Chronic anticoagulation 07/11/2019   Chronic bilateral pleural effusions  R>L 06/28/2019   Macrocytic anemia 06/07/2019   Lung infiltrate    Shortness of breath    DNR (do not resuscitate)    Palliative care by specialist    S/P  thoracentesis    SOB (shortness of breath)    Leukocytosis    ESRD (end stage renal disease) ([place])    Acute on chronic combined systolic and diastolic heart failure ([place])    COPD GOLD II     Weakness generalized 05/30/2019   Acute on chronic respiratory failure ([place]) 05/19/2019   Chronic respiratory failure with hypoxia and hypercapnia ([place]) 05/06/2019   DOE (dyspnea on exertion) 05/04/2019   Chronic respiratory failure with hypoxia ([place]) 04/25/2019   History of anemia due to chronic kidney disease 04/17/2019   Pressure injury of skin 03/17/2019   Longstanding persistent atrial fibrillation ([place]) 03/11/2019   Chronic diastolic heart failure ([place]) 01/17/2019   Edema of both legs 01/02/2019   Pulmonary edema    Hypomagnesemia 11/15/2016   Sleep apnea    Sepsis ([place]) 11/10/2016   Elevated troponin 11/10/2016   Cardiomyopathy ([place]) 11/10/2016   PSVT (paroxysmal supraventricular tachycardia) ([place]) 11/10/2016   Dysphagia    Enteritis due to Clostridium difficile    Aspiration pneumonia ([place]) 11/04/2016   Diarrhea 11/04/2016   AKI (acute kidney injury) ([place]) 11/04/2016   Hyperkalemia 11/04/2016   Essential hypertension 05/31/2013   COPD with acute exacerbation ([place]) 05/29/2013   Hypokalemia 05/08/2013   Hypotension 05/08/2013   Dizziness 05/08/2013   Hypoxia 05/08/2013   Acute respiratory failure ([place])  05/08/2013    Past Surgical History:  Procedure Laterality Date   A/V FISTULAGRAM N/A 10/19/2019   Procedure: A/V FISTULAGRAM- Left Arm;  Surgeon: Angelia Mould, MD;  Location: [facility];  Service: Cardiovascular;  Laterality: N/A;   AV FISTULA PLACEMENT Left 06/14/2019   Procedure: Creation of Left arm arteriovenous fistula;  Surgeon: Rosetta Posner, MD;  Location: [facility];  Service: Vascular;  Laterality: Left;   CARDIOVERSION N/A 03/16/2019   Procedure: CARDIOVERSION;  Surgeon: Pixie Casino, MD;  Location: [facility] ENDOSCOPY;  Service: Cardiovascular;  Laterality:  N/A;   CARDIOVERSION N/A 03/23/2019   Procedure: CARDIOVERSION;  Surgeon: Lelon Perla, MD;  Location: [facility];  Service: Cardiovascular;  Laterality: N/A;   CATARACT EXTRACTION     COLON RESECTION     COLONOSCOPY     IMPLANTATION BONE ANCHORED HEARING AID Left 2016   IR FLUORO GUIDE CV LINE RIGHT  06/09/2019   IR THORACENTESIS ASP PLEURAL SPACE W/IMG GUIDE  05/21/2019   IR THORACENTESIS ASP PLEURAL SPACE W/IMG GUIDE  10/19/2021   IR US GUIDE VASC ACCESS RIGHT  06/09/2019   MINOR HEMORRHOIDECTOMY  1995   TEE WITHOUT CARDIOVERSION N/A 03/16/2019   Procedure: TRANSESOPHAGEAL ECHOCARDIOGRAM (TEE);  Surgeon: Pixie Casino, MD;  Location: [facility] ENDOSCOPY;  Service: Cardiovascular;  Laterality: N/A;   TONSILLECTOMY         Family History  Problem Relation Age of Onset   Brain cancer Father    Colon cancer Mother        with mets to liver   Lung cancer Brother        was a smoker    Social History   Tobacco Use   Smoking status: Former    Packs/day: 1.00    Years: 20.00    Pack years: 20.00    Types: Cigarettes    Quit date: 12/20/1961    Years since quitting: 59.9   Smokeless tobacco: Never  Vaping Use   Vaping Use: Never used  Substance Use Topics   Alcohol use: Yes    Alcohol/week: 7.0 standard drinks    Types: 7 Standard drinks or equivalent per week    Comment: states he drinks 3-4 days a week, "a couple" on those occasions but does not further quaniity.   Drug use: No    Home Medications Prior to Admission medications   Medication Sig Start Date End Date Taking? Authorizing Provider  acetaminophen (TYLENOL) 325 MG tablet Take 650 mg by mouth every 6 (six) hours as needed.    [provider]  albuterol (PROAIR HFA) 108 (90 Base) MCG/ACT inhaler 2 puffs every 4 hours as needed only  if your can't catch your breath 11/09/21   Tanda Rockers, MD  apixaban (ELIQUIS) 2.5 MG TABS tablet TAKE 1 TABLET(2.5 MG) BY MOUTH TWICE DAILY 08/17/21   Lorretta Harp, MD   calcitRIOL (ROCALTROL) 0.5 MCG capsule Take 0.5 mcg by mouth daily. 10/29/21   [provider]  famotidine (PEPCID) 20 MG tablet One after supper Patient not taking: Reported on 11/27/2021 01/19/21   Tanda Rockers, MD  levalbuterol Penne Lash) 0.63 MG/3ML nebulizer solution Take 3 mLs (0.63 mg total) by nebulization every 6 (six) hours as needed for wheezing or shortness of breath. 11/13/21   Cobb, Karie Schwalbe, NP  multivitamin (RENA-VIT) TABS tablet Take 1 tablet by mouth at bedtime. 06/15/19   Mercy Riding, MD  pantoprazole (PROTONIX) 40 MG tablet Take 1 tablet (40 mg  total) by mouth daily. Take 30-60 min before first meal of the day Patient not taking: Reported on 11/27/2021 01/19/21   Tanda Rockers, MD  sevelamer carbonate (RENVELA) 800 MG tablet Take 1 tablet (800 mg total) by mouth 3 (three) times daily with meals. 06/15/19   Mercy Riding, MD  TRELEGY ELLIPTA 100-62.5-25 MCG/INH AEPB INHALE 1 PUFF INTO THE LUNGS DAILY 04/27/21   Martyn Ehrich, NP  zolpidem (AMBIEN) 10 MG tablet Take 10 mg by mouth at bedtime as needed. 12/11/20   [provider]    Allergies    Amlodipine and Tizanidine  Review of Systems   Review of Systems  Constitutional:  Positive for fatigue. Negative for appetite change and fever.  HENT:  Negative for congestion.   Respiratory:  Positive for cough and shortness of breath.   Cardiovascular:  Negative for leg swelling.  Gastrointestinal:  Negative for abdominal pain.  Genitourinary:  Negative for flank pain and frequency.  Musculoskeletal:  Positive for back pain.  Skin:  Negative for rash.  Neurological:  Positive for weakness.  Psychiatric/Behavioral:  Negative for confusion.    Physical Exam Updated Vital Signs BP 96/65   Pulse (!) 106   Temp 98.3 F (36.8 C) (Oral)   Resp 19   Ht 5\' 10"  (1.778 m)   Wt 84.4 kg   SpO2 98%   BMI 26.70 kg/m   Physical Exam Vitals and nursing note reviewed.  HENT:     Head: Normocephalic.   Cardiovascular:     Rate and Rhythm: Normal rate and regular rhythm.  Pulmonary:     Comments: Diffuse harsh breath sounds with prolonged expirations. Chest:     Chest wall: No tenderness.  Abdominal:     Tenderness: There is no abdominal tenderness.  Musculoskeletal:     Right lower leg: No tenderness.     Left lower leg: No tenderness.  Skin:    General: Skin is warm.     Capillary Refill: Capillary refill takes less than 2 seconds.  Neurological:     Mental Status: He is alert and oriented to person, place, and time.    ED Results / Procedures / Treatments   Labs (all labs ordered are listed, but only abnormal results are displayed) Labs Reviewed  COMPREHENSIVE METABOLIC PANEL - Abnormal; Notable for the following components:      Result Value   Potassium 3.1 (*)    Chloride 93 (*)    BUN 55 (*)    Creatinine, Ser 7.24 (*)    Total Protein 6.0 (*)    Albumin 3.1 (*)    GFR, Estimated 7 (*)    Anion gap 16 (*)    All other components within normal limits  CBC WITH DIFFERENTIAL/PLATELET - Abnormal; Notable for the following components:   WBC 12.0 (*)    RBC 2.39 (*)    Hemoglobin 8.2 (*)    HCT 25.2 (*)    MCV 105.4 (*)    MCH 34.3 (*)    RDW 16.7 (*)    Neutro Abs 9.6 (*)    Monocytes Absolute 1.3 (*)    All other components within normal limits  OCCULT BLOOD X 1 CARD TO LAB, STOOL - Abnormal; Notable for the following components:   Fecal Occult Bld POSITIVE (*)    All other components within normal limits  RESP PANEL BY RT-PCR (FLU A&B, COVID) ARPGX2  CULTURE, BLOOD (ROUTINE X 2)  CULTURE, BLOOD (ROUTINE X 2)  LACTIC  ACID, PLASMA  LACTIC ACID, PLASMA    EKG EKG Interpretation  Date/Time:  Friday November 27 2021 19:01:05 EST Ventricular Rate:  99 PR Interval:    QRS Duration: 138 QT Interval:  393 QTC Calculation: 505 R Axis:   76 Text Interpretation: Atrial fibrillation Ventricular premature complex Right bundle branch block Confirmed by  Davonna Belling [phone]) on 11/27/2021 9:48:40 PM  Radiology DG Chest 2 View  Result Date: 11/27/2021 CLINICAL DATA:  Pleural effusion EXAM: CHEST - 2 VIEW COMPARISON:  11/13/2021 FINDINGS: Right lower lobe and to a lesser extent right upper lobe airspace disease concerning for multilobar pneumonia. Small right pleural effusion. Mild left basilar atelectasis. No pneumothorax. Stable cardiomediastinal silhouette. No acute osseous abnormality. IMPRESSION: Right lower lobe and to a lesser extent right upper lobe airspace disease concerning for multilobar pneumonia. Electronically Signed   By: Kathreen Devoid M.D.   On: 11/27/2021 15:53   CT Lumbar Spine Wo Contrast  Result Date: 11/27/2021 CLINICAL DATA:  Initial evaluation for acute low back pain, increased fracture risk. EXAM: CT LUMBAR SPINE WITHOUT CONTRAST TECHNIQUE: Multidetector CT imaging of the lumbar spine was performed without intravenous contrast administration. Multiplanar CT image reconstructions were also generated. COMPARISON:  None available. FINDINGS: Segmentation: Standard. Lowest well-formed disc space labeled the L5-S1 level. Alignment: Mild sigmoid scoliosis. Trace stepwise retrolisthesis of L2 on L3 through L4 on L5. Vertebrae: Acute to subacute compression fracture involving the superior endplate of L1 with up to 25% height loss without bony retropulsion. Vertebral body height otherwise maintained. Visualized sacrum and pelvis intact. SI joints symmetric and within normal limits. No discrete or worrisome osseous lesions. Paraspinal and other soft tissues: Mild paraspinous edema adjacent to the L1 fracture. Chronic fatty atrophy noted with throughout the posterior paraspinous and gluteal musculature. Advanced aorto bi-iliac atherosclerotic disease. Right pleural effusion partially visualized. Disc levels: L1-2: Disc desiccation with mild disc bulge. Mild-to-moderate right worse than left facet hypertrophy. No spinal stenosis. Foramina  remain patent. L2-3: Trace retrolisthesis with intervertebral disc space narrowing. Diffuse disc bulge without focal disc protrusion. Moderate facet hypertrophy. Mild bilateral lateral recess stenosis. Foramina remain patent. L3-4: Degenerative intervertebral disc space narrowing with retrolisthesis. Diffuse disc bulge with disc desiccation, eccentric to the right. Associated reactive endplate spurring. Moderate bilateral facet hypertrophy. Resultant mild canal with moderate bilateral lateral recess stenosis. Moderate bilateral L3 foraminal stenosis. L4-5: Degenerative intervertebral disc space narrowing with diffuse disc bulge and disc desiccation. Reactive endplate spurring. Moderate facet and ligament flavum hypertrophy. Moderate canal with bilateral subarticular stenosis. Moderate to severe bilateral L4 foraminal stenosis. L5-S1: Degenerative intervertebral disc space narrowing with diffuse disc bulge and disc desiccation. Reactive endplate spurring. Superimposed central to left subarticular disc protrusion with annular calcification (series 4, image 102). Protruding disc contacts and/or closely approximates both of the descending S1 nerve roots without frank impingement or displacement. Mild facet hypertrophy. No significant spinal stenosis. Moderate bilateral L5 foraminal narrowing. IMPRESSION: 1. Acute to subacute compression fracture involving the superior endplate of L1 with up to 25% height loss without bony retropulsion. 2. Multilevel degenerative spondylosis and facet arthrosis as above. Resultant mild to moderate canal and bilateral subarticular stenosis at L2-3 through L4-5, with moderate to severe bilateral L3 through L5 foraminal narrowing as above. 3. Right pleural effusion, partially visualized. 4. Aortic Atherosclerosis (ICD10-I70.0). Electronically Signed   By: Jeannine Boga M.D.   On: 11/27/2021 19:48    Procedures Procedures   Medications Ordered in ED Medications   ipratropium-albuterol (DUONEB) 0.5-2.5 (3) MG/3ML nebulizer  solution 3 mL (3 mLs Nebulization Given 11/27/21 1735)  cefTRIAXone (ROCEPHIN) 1 g in sodium chloride 0.9 % 100 mL IVPB (0 g Intravenous Stopped 11/27/21 1959)  azithromycin (ZITHROMAX) 500 mg in sodium chloride 0.9 % 250 mL IVPB (0 mg Intravenous Stopped 11/27/21 2105)    ED Course  I have reviewed the triage vital signs and the nursing notes.  Pertinent labs & imaging results that were available during my care of the patient were reviewed by me and considered in my medical decision making (see chart for details).    MDM Rules/Calculators/A&P                           Patient presented with shortness of breath.  Has been seen by pulmonary.  Has been on antibiotics.  Followed up with pulmonary again and found to have worsening pneumonia on x-ray.  Continued shortness of breath.  States he is feeling more short of breath or may be just not improving.  However is limited due to back pain.  No injury but did have a 25% L1 compression fracture that is acute to subacute.  No retropulsion seen. Also found to have an anemia.  Reportedly has had a hemoglobin of 10 as recently as 3 weeks ago on his dialysis labs.  He gets peritoneal dialysis at home.  Guaiac was positive but not frankly bloody.  Is on Eliquis for atrial fibrillation.  Will require admission to the hospital for all the above listed issues.  Will admit to hospitalist at [facility].  Final Clinical Impression(s) / ED Diagnoses Final diagnoses:  Community acquired pneumonia, unspecified laterality  End-stage renal disease on peritoneal dialysis ([place])  Anemia, unspecified type  Gastrointestinal hemorrhage, unspecified gastrointestinal hemorrhage type  Compression fracture of L1 vertebra, initial encounter [facility])    Rx / DC Orders ED Discharge Orders     None        Davonna Belling, MD 11/27/21 2150

## 2021-11-27 NOTE — Patient Instructions (Signed)
Given your chest x ray today and your severe lower back pain, further evaluation in the Emergency Department is warranted.   Continue on your supplemental O2 therapy of 2L/min.   Continue Trelegy 100 1 puff daily  Continue xopenex inhaler 2 puffs every 4 hours as needed for shortness of breath or wheezing Continue xopenex nebs 3 mL as needed every 6 hours for shortness of breath or wheezing  Follow up with Dr. Melvyn Novas, Roxan Diesel, NP or APP.

## 2021-11-27 NOTE — Assessment & Plan Note (Signed)
Chronic a fib, rate 117 bpm in office. No change from baseline. See above plan.

## 2021-11-27 NOTE — Assessment & Plan Note (Addendum)
Persistent. Slight improvement in symptoms initially with abx and prednisone course. Worsening over the past few days. CXR showed persistent right lower lobe airspace disease, now with right upper lobe airspace disease, and concern for multilobar pneumonia. Small right pleural effusion is still present. Concern for decompensation given appearance and comorbidities. No appropriate for outpt treatment at this time. ED evaluation warranted with possible admission. Discussed with pt and wife, who verbalized understanding and agreed to go to ED upon leaving the office.   Patient Instructions  Given your chest x ray today and your severe lower back pain, further evaluation in the Emergency Department is warranted.   Continue on your supplemental O2 therapy of 2L/min.   Continue Trelegy 100 1 puff daily  Continue xopenex inhaler 2 puffs every 4 hours as needed for shortness of breath or wheezing Continue xopenex nebs 3 mL as needed every 6 hours for shortness of breath or wheezing  Follow up with Dr. Melvyn Novas, Roxan Diesel, NP or APP.

## 2021-11-27 NOTE — Progress Notes (Addendum)
@Patient  ID: David Barton, male    DOB: 09-03-1933, 87 y.o.   MRN: [id number]  Chief Complaint  Patient presents with   Follow-up    Breathing not improved or worsened since the last visit. He feels like his back pain has been worse for the past wk. He is using his xopenex nebs twice daily.     Referring provider: Chesley Noon, MD  HPI: 87 year old male, former smoker (20 pack years) followed for COPD Gold 2 with emphysema, chronic respiratory failure on home oxygen, and chronic bilateral pleural effusions.  He is a patient of Dr. Gustavus Bryant and was last seen in office on 11/13/2021 by [facility] NP.  Past medical history significant for hypertension, persistent A. fib on chronic anticoagulantion with Eliquis, diastolic heart failure, colon cancer, end-stage renal disease on dialysis.  He is followed by cardiology and nephrology.  TEST/EVENTS:  07/16/2013 PFTs: FEV1 2.02 (71), ratio 52, TLC 7.54 (110), DLCO 74% 10/09/2021 CXR 2 view arthrosclerosis.  Persistent moderate right-sided pleural effusion.  Patchy airspace opacity within the inferior aspect of the right upper lobes, similar to prior.  Left lung remains hyperexpanded and otherwise clear. 10/22/2021 CXR 2 view: Interval increase in airspace opacity in the right lung, with the right small pleural effusion, similar in volume to prior.  Emphysema of the left lung, otherwise normally aerated.  Mild cardiomegaly. 11/11/2021 echocardiogram: LVEF 45 to 50%.  LV mild decreased function.  RV function and size normal.  AV calcified without stenosis or regurgitation. 11/13/2021 CXR 2 view left lung clear.  Mild right pleural effusion, increased compared to prior exam.  Perihilar and basilar opacity noted concerning for pneumonia.  10/09/2021: OV with Dr. Melvyn Novas.  Noted to have increased shortness of breath with exertion.  Chronic moderate right pleural effusion noticed on chest x-ray.  Recommended thoracentesis.  Maintained on Trelegy inhaler, as  needed [place] and home O2.  10/19/2021: IR thoracentesis with culture.  No growth.  Gram stain with few WBCs present predominantly mononuclear.  No microorganisms seen.  Removed 20 mL of fluid per patient with no improvement in symptoms  11/13/2021: OV with Anabell Swint NP.  Persistent SOB with exertion.  Increased cough and sputum production.  Continued on home O2 at 2 to 2-2.5 L/min with activity.  CXR significant for perihilar and basilar opacity consistent with pneumonia.  Treated for COPD exacerbation related to CAP.  Xopenex neb in clinic.  Continued on Trelegy 100, albuterol as needed. Xopenex nebs twice daily.  Supportive care.  Prednisone 2 day taper pack and Augmentin 7 days.  Pulmonary rehab referral sent.  Discussed PFTs at next visit.  Close follow-up.  11/27/2021: Today-follow-up visit Patient presents today with wife for follow-up after being seen on 1125 and treated for pneumonia and COPD exacerbation.  He reports he had improvement of symptoms while on the Augmentin and prednisone; however, his shortness of breath has since worsened and he is also experiencing severe lower back pain.  His wife does report that his cough is improved but is still productive with white to yellow sputum.  He does have occasional wheezing and is experience persistent fatigue.  He denies any fevers, chills, body aches.  He denies orthopnea, PND, chest pain, lower extremity swelling.  He does have chronic back pain however it has substantially worsened over the past few days.  He denies saddle anesthesia, bowel incontinence, weakness in his lower extremities, or sciatica. He does have tingling in his feet, which is unchanged. He is a  peritoneal dialysis patient and is oliguric at baseline. He continues on his trelegy inhaler daily and his xopenex nebs. He did complete his augmentin and prednisone courses. He has not had any increasing oxygen needs and remains on 2L [place] with sats >90%. Overall, he feels poorly.    Allergies   Allergen Reactions   Amlodipine Swelling   Tizanidine Other (See Comments)    Other reaction(s): hypotension    Immunization History  Administered Date(s) Administered   Influenza, High Dose Seasonal PF 09/30/2018, 09/10/2019, 08/20/2020   Influenza-Unspecified 09/26/2021   Moderna Sars-Covid-2 Vaccination 01/26/2020, 03/03/2020, 10/15/2020   Pneumococcal Polysaccharide-23 05/30/2019   Tdap 07/20/2018    Past Medical History:  Diagnosis Date   Alcohol abuse    Colon cancer ([place])    COPD (chronic obstructive pulmonary disease) ([place])    Emphysema of lung ([place])    ESRD (end stage renal disease) ([place])    Former tobacco use    Hypertension    Peripheral edema    Sepsis ([place]) 10/2016   Aspiration PNA/C diff colitis    Tobacco History: Social History   Tobacco Use  Smoking Status Former   Packs/day: 1.00   Years: 20.00   Pack years: 20.00   Types: Cigarettes   Quit date: 12/20/1961   Years since quitting: 59.9  Smokeless Tobacco Never   Counseling given: Not Answered   Outpatient Medications Prior to Visit  Medication Sig Dispense Refill   acetaminophen (TYLENOL) 325 MG tablet Take 650 mg by mouth every 6 (six) hours as needed.     albuterol (PROAIR HFA) 108 (90 Base) MCG/ACT inhaler 2 puffs every 4 hours as needed only  if your can't catch your breath 8 g 6   apixaban (ELIQUIS) 2.5 MG TABS tablet TAKE 1 TABLET(2.5 MG) BY MOUTH TWICE DAILY 180 tablet 1   calcitRIOL (ROCALTROL) 0.5 MCG capsule Take 0.5 mcg by mouth daily.     levalbuterol (XOPENEX) 0.63 MG/3ML nebulizer solution Take 3 mLs (0.63 mg total) by nebulization every 6 (six) hours as needed for wheezing or shortness of breath. 3 mL 12   multivitamin (RENA-VIT) TABS tablet Take 1 tablet by mouth at bedtime. 90 tablet 0   sevelamer carbonate (RENVELA) 800 MG tablet Take 1 tablet (800 mg total) by mouth 3 (three) times daily with meals. 270 tablet 0   TRELEGY ELLIPTA 100-62.5-25 MCG/INH AEPB INHALE 1 PUFF INTO THE  LUNGS DAILY 60 each 5   zolpidem (AMBIEN) 10 MG tablet Take 10 mg by mouth at bedtime as needed.     famotidine (PEPCID) 20 MG tablet One after supper (Patient not taking: Reported on 11/27/2021) 30 tablet 11   pantoprazole (PROTONIX) 40 MG tablet Take 1 tablet (40 mg total) by mouth daily. Take 30-60 min before first meal of the day (Patient not taking: Reported on 11/27/2021) 30 tablet 2   amoxicillin-clavulanate (AUGMENTIN) 875-125 MG tablet Take 1 tablet by mouth 2 (two) times daily. 14 tablet 0   predniSONE (DELTASONE) 10 MG tablet 4 tabs for 2 days, then 3 tabs for 2 days, 2 tabs for 2 days, then 1 tab for 2 days, then stop 20 tablet 0   Facility-Administered Medications Prior to Visit  Medication Dose Route Frequency Provider Last Rate Last Admin   levalbuterol (XOPENEX) nebulizer solution 0.63 mg  0.63 mg Nebulization Q0600 Tomasita Beevers, Karie Schwalbe, NP   0.63 mg at 11/13/21 1137     Review of Systems:   Constitutional: No weight loss or gain, night  sweats, fevers, chills. +fatigue HEENT: No headaches, difficulty swallowing, tooth/dental problems, or sore throat. No sneezing, itching, ear ache, nasal congestion, or post nasal drip CV:  No chest pain, orthopnea, PND, swelling in lower extremities, anasarca, dizziness, palpitations, syncope Resp: +worsening SOB upon exertion and at rest; productive cough (improved); occasional wheezing. No hemoptysis.  No chest wall deformity GI:  No heartburn, indigestion, abdominal pain, nausea, vomiting, diarrhea, change in bowel habits, loss of appetite, bloody stools.  GU: No dysuria, change in color of urine, urgency or frequency.  No flank pain, no hematuria  Skin: No rash, lesions, ulcerations MSK:  No joint pain or swelling.  No decreased range of motion.  +chronic back pain, worsening over past few days Neuro: No dizziness or lightheadedness.  Psych: No depression or anxiety. Mood stable.     Physical Exam:  BP (!) 102/54 (BP Location: Left Arm,  Cuff Size: Normal)   Pulse (!) 117   Temp 97.7 F (36.5 C) (Oral)   Ht 5\' 10"  (1.778 m)   Wt 186 lb 1.1 oz (84.4 kg)   SpO2 92% Comment: on RA  BMI 26.70 kg/m   GEN: Pleasant, interactive, acute on chronically-ill appearing; in no acute distress. HEENT:  Normocephalic and atraumatic. EACs patent bilaterally. TM pearly gray with present light reflex bilaterally. PERRLA. Sclera white. Nasal turbinates pink, moist and patent bilaterally. No rhinorrhea present. Oropharynx pink and moist, without exudate or edema. No lesions, ulcerations, or postnasal drip.  NECK:  Supple w/ fair ROM. No JVD present. Normal carotid impulses w/o bruits. Thyroid symmetrical with no goiter or nodules palpated. No lymphadenopathy.   CV: RRR, no m/r/g, no peripheral edema. Pulses intact, +2 bilaterally. No cyanosis, pallor or clubbing. PULMONARY:  Unlabored, regular breathing. Scattered rhonchi with wheeze noted to LLL, diminished breath sound RLL>LLL. No accessory muscle use. No dullness to percussion. GI: BS present and normoactive. Soft, non-tender to palpation. No organomegaly or masses detected. No CVA tenderness. MSK: No erythema, warmth or tenderness. Cap refil <2 sec all extrem. No deformities or joint swelling noted.  Neuro: A/Ox3. No focal deficits noted.   Skin: Warm, no lesions or rashe Psych: Normal affect and behavior. Judgement and thought content appropriate.     Lab Results:  CBC    Component Value Date/Time   WBC 10.8 (H) 01/19/2021 1200   RBC 3.14 (L) 01/19/2021 1200   HGB 10.7 (L) 01/19/2021 1200   HGB 14.1 12/21/2018 1038   HGB 14.9 11/24/2009 0958   HCT 31.9 (L) 01/19/2021 1200   HCT 30.1 (L) 06/09/2019 1329   HCT 43.8 11/24/2009 0958   PLT 365.0 01/19/2021 1200   PLT 276 12/21/2018 1038   MCV 101.4 (H) 01/19/2021 1200   MCV 101 (H) 12/21/2018 1038   MCV 108.6 (H) 11/24/2009 0958   MCH 30.1 08/20/2019 1429   MCHC 33.5 01/19/2021 1200   RDW 15.3 01/19/2021 1200   RDW 12.5  12/21/2018 1038   RDW 15.4 (H) 11/24/2009 0958   LYMPHSABS 0.8 01/19/2021 1200   LYMPHSABS 2.2 11/24/2009 0958   MONOABS 1.3 (H) 01/19/2021 1200   MONOABS 0.6 11/24/2009 0958   EOSABS 0.0 01/19/2021 1200   EOSABS 0.2 11/24/2009 0958   BASOSABS 0.0 01/19/2021 1200   BASOSABS 0.0 11/24/2009 0958    BMET    Component Value Date/Time   NA 131 (L) 01/19/2021 1200   NA 139 12/21/2018 1045   K 3.8 01/19/2021 1200   CL 90 (L) 01/19/2021 1200   CO2 23  01/19/2021 1200   GLUCOSE 90 01/19/2021 1200   BUN 45 (H) 01/19/2021 1200   BUN 35 (H) 12/21/2018 1045   CREATININE 7.99 (HH) 01/19/2021 1200   CALCIUM 8.9 01/19/2021 1200   GFRNONAA 10 (L) 08/20/2019 1429   GFRAA 11 (L) 08/20/2019 1429    BNP    Component Value Date/Time   BNP 465.7 (H) 05/19/2019 0901     Imaging:  11/27/2021: CXR reviewed by me with persistent RLL pneumonia and new RUL airspace disease. Consistent with multilobar pneumonia. Small right pleural effusion remains. Mild left basilar atelectasis.   DG Chest 2 View  Result Date: 11/27/2021 CLINICAL DATA:  Pleural effusion EXAM: CHEST - 2 VIEW COMPARISON:  11/13/2021 FINDINGS: Right lower lobe and to a lesser extent right upper lobe airspace disease concerning for multilobar pneumonia. Small right pleural effusion. Mild left basilar atelectasis. No pneumothorax. Stable cardiomediastinal silhouette. No acute osseous abnormality. IMPRESSION: Right lower lobe and to a lesser extent right upper lobe airspace disease concerning for multilobar pneumonia. Electronically Signed   By: Kathreen Devoid M.D.   On: 11/27/2021 15:53   DG Chest 2 View  Result Date: 11/13/2021 CLINICAL DATA:  Productive cough. EXAM: CHEST - 2 VIEW COMPARISON:  October 22, 2021. FINDINGS: Stable cardiomediastinal silhouette. No pneumothorax is noted. Left lung is clear. Mild right pleural effusion is noted which is increased compared to prior exam. Perihilar and basilar opacity is noted concerning for  pneumonia. Bony thorax is unremarkable. IMPRESSION: Right perihilar and basilar pneumonia is noted. Increased right pleural effusion. Electronically Signed   By: Marijo Conception M.D.   On: 11/13/2021 12:33   ECHOCARDIOGRAM COMPLETE  Result Date: 11/11/2021    ECHOCARDIOGRAM REPORT   Patient Name:   NEGAN GRUDZIEN Date of Exam: 11/11/2021 Medical Rec #:  [id number]       Height:       70.0 in Accession #:    [id number]      Weight:       190.2 lb Date of Birth:  06/19/1933      BSA:          2.043 m Patient Age:    85 years        BP:           100/64 mmHg Patient Gender: M               HR:           105 bpm. Exam Location:  [place] Procedure: 2D Echo, Cardiac Doppler and Color Doppler Indications:    J90 Bilateral Pleural Effusion  History:        Patient has prior history of Echocardiogram examinations, most                 recent 05/21/2019. COPD; Risk Factors:Alcohol Abuse, Former Smoker                 and Hypertension. Emphysema of Lung. End Stage Renal Disease.  Sonographer:    Wilford Sports Rodgers-Jones RDCS Referring Phys: [place]  1. Left ventricular ejection fraction, by estimation, is 45 to 50%. The left ventricle has mildly decreased function. The left ventricle demonstrates global hypokinesis. Left ventricular diastolic function could not be evaluated.  2. Right ventricular systolic function is normal. The right ventricular size is normal.  3. The mitral valve is normal in structure. No evidence of mitral valve regurgitation. No evidence of mitral stenosis.  4. The aortic valve is  calcified. There is mild calcification of the aortic valve. There is mild thickening of the aortic valve. Aortic valve regurgitation is not visualized. Mild aortic valve stenosis. FINDINGS  Left Ventricle: Left ventricular ejection fraction, by estimation, is 45 to 50%. The left ventricle has mildly decreased function. The left ventricle demonstrates global hypokinesis. The left ventricular  internal cavity size was normal in size. There is  no left ventricular hypertrophy. Left ventricular diastolic function could not be evaluated due to atrial fibrillation. Left ventricular diastolic function could not be evaluated. Right Ventricle: The right ventricular size is normal. Right vetricular wall thickness was not well visualized. Right ventricular systolic function is normal. Left Atrium: Left atrial size was normal in size. Right Atrium: Right atrial size was normal in size. Pericardium: There is no evidence of pericardial effusion. Mitral Valve: The mitral valve is normal in structure. No evidence of mitral valve regurgitation. No evidence of mitral valve stenosis. Tricuspid Valve: The tricuspid valve is normal in structure. Tricuspid valve regurgitation is mild. Aortic Valve: The aortic valve is calcified. There is mild calcification of the aortic valve. There is mild thickening of the aortic valve. There is mild aortic valve annular calcification. Aortic valve regurgitation is not visualized. Mild aortic stenosis is present. Pulmonic Valve: The pulmonic valve was grossly normal. Pulmonic valve regurgitation is not visualized. Aorta: The aortic root and ascending aorta are structurally normal, with no evidence of dilitation. IAS/Shunts: The interatrial septum was not well visualized.  LEFT VENTRICLE PLAX 2D LVIDd:         4.20 cm LVIDs:         3.10 cm LV PW:         1.10 cm LV IVS:        0.90 cm LVOT diam:     2.20 cm LV SV:         38 LV SV Index:   19 LVOT Area:     3.80 cm  RIGHT VENTRICLE RV Basal diam:  4.70 cm LEFT ATRIUM             Index        RIGHT ATRIUM           Index LA diam:        4.50 cm 2.20 cm/m   RA Area:     15.60 cm LA Vol (A2C):   26.8 ml 13.12 ml/m  RA Volume:   35.40 ml  17.33 ml/m LA Vol (A4C):   33.3 ml 16.30 ml/m LA Biplane Vol: 30.7 ml 15.03 ml/m  AORTIC VALVE LVOT Vmax:   68.15 cm/s LVOT Vmean:  45.050 cm/s LVOT VTI:    0.100 m  AORTA Ao Root diam: 3.60 cm Ao Asc  diam:  3.30 cm MV E velocity: 97.42 cm/s  TRICUSPID VALVE                            TR Peak grad:   20.8 mmHg                            TR Vmax:        228.00 cm/s                             SHUNTS  Systemic VTI:  0.10 m                            Systemic Diam: 2.20 cm Mertie Moores MD Electronically signed by Mertie Moores MD Signature Date/Time: 11/11/2021/3:54:25 PM    Final    LONG TERM MONITOR (3-14 DAYS)  Result Date: 11/18/2021 Patch Wear Time:  4 days and 20 hours (2022-11-13T16:22:08-499 to 2022-11-18T13:13:16-0500) 7 Ventricular Tachycardia runs occurred, the run with the fastest interval lasting 4 beats with a max rate of 176 bpm, the longest lasting 5 beats with an avg rate of 144 bpm. Atrial Fibrillation occurred continuously (100% burden), ranging from 70-171 bpm (avg of 108 bpm). Isolated VEs were occasional (3.0%, 19281), VE Couplets were rare (<1.0%, 2054), and VE Triplets were rare (<1.0%, 94). Ventricular Bigeminy and Trigeminy were present. 1.  Persistent atrial fibrillation 2.  Frequent PVCs   levalbuterol (XOPENEX) nebulizer solution 0.63 mg     Date Action Dose Route User   11/13/2021 1137 Given 0.63 mg Nebulization Nolon Stalls, Kimber Relic, RN       No flowsheet data found.  No results found for: NITRICOXIDE      Assessment & Plan:   Community acquired pneumonia of right lung Persistent. Slight improvement in symptoms initially with abx and prednisone course. Worsening over the past few days. CXR showed persistent right lower lobe airspace disease, now with right upper lobe airspace disease, and concern for multilobar pneumonia. Small right pleural effusion is still present. Concern for decompensation given appearance and comorbidities. No appropriate for outpt treatment at this time. ED evaluation warranted with possible admission. Discussed with pt and wife, who verbalized understanding and agreed to go to ED upon leaving the office.    Patient Instructions  Given your chest x ray today and your severe lower back pain, further evaluation in the Emergency Department is warranted.   Continue on your supplemental O2 therapy of 2L/min.   Continue Trelegy 100 1 puff daily  Continue xopenex inhaler 2 puffs every 4 hours as needed for shortness of breath or wheezing Continue xopenex nebs 3 mL as needed every 6 hours for shortness of breath or wheezing  Follow up with Dr. Melvyn Novas, Roxan Diesel, NP or APP.    COPD with acute exacerbation ([place]) See above plan.  Longstanding persistent atrial fibrillation ([name]) Chronic a fib, rate 117 bpm in office. No change from baseline. See above plan.     Clayton Bibles, NP 11/27/2021  Pt aware and understands NP's role.

## 2021-11-27 NOTE — Assessment & Plan Note (Signed)
See above plan. 

## 2021-11-28 ENCOUNTER — Encounter (HOSPITAL_COMMUNITY): Payer: Self-pay | Admitting: Internal Medicine

## 2021-11-28 ENCOUNTER — Inpatient Hospital Stay (HOSPITAL_COMMUNITY): Payer: Medicare Other

## 2021-11-28 DIAGNOSIS — Z66 Do not resuscitate: Secondary | ICD-10-CM

## 2021-11-28 DIAGNOSIS — D649 Anemia, unspecified: Secondary | ICD-10-CM | POA: Diagnosis not present

## 2021-11-28 DIAGNOSIS — J189 Pneumonia, unspecified organism: Secondary | ICD-10-CM | POA: Diagnosis not present

## 2021-11-28 DIAGNOSIS — J9611 Chronic respiratory failure with hypoxia: Secondary | ICD-10-CM

## 2021-11-28 DIAGNOSIS — S32010A Wedge compression fracture of first lumbar vertebra, initial encounter for closed fracture: Secondary | ICD-10-CM | POA: Diagnosis not present

## 2021-11-28 DIAGNOSIS — E871 Hypo-osmolality and hyponatremia: Secondary | ICD-10-CM | POA: Diagnosis present

## 2021-11-28 DIAGNOSIS — Z992 Dependence on renal dialysis: Secondary | ICD-10-CM | POA: Diagnosis not present

## 2021-11-28 DIAGNOSIS — Z515 Encounter for palliative care: Secondary | ICD-10-CM | POA: Diagnosis not present

## 2021-11-28 DIAGNOSIS — F1010 Alcohol abuse, uncomplicated: Secondary | ICD-10-CM | POA: Diagnosis present

## 2021-11-28 DIAGNOSIS — I132 Hypertensive heart and chronic kidney disease with heart failure and with stage 5 chronic kidney disease, or end stage renal disease: Secondary | ICD-10-CM | POA: Diagnosis present

## 2021-11-28 DIAGNOSIS — N186 End stage renal disease: Secondary | ICD-10-CM

## 2021-11-28 DIAGNOSIS — I5042 Chronic combined systolic (congestive) and diastolic (congestive) heart failure: Secondary | ICD-10-CM | POA: Diagnosis present

## 2021-11-28 DIAGNOSIS — D631 Anemia in chronic kidney disease: Secondary | ICD-10-CM | POA: Diagnosis present

## 2021-11-28 DIAGNOSIS — I4821 Permanent atrial fibrillation: Secondary | ICD-10-CM | POA: Diagnosis present

## 2021-11-28 DIAGNOSIS — I959 Hypotension, unspecified: Secondary | ICD-10-CM | POA: Diagnosis present

## 2021-11-28 DIAGNOSIS — D62 Acute posthemorrhagic anemia: Secondary | ICD-10-CM | POA: Diagnosis present

## 2021-11-28 DIAGNOSIS — J9612 Chronic respiratory failure with hypercapnia: Secondary | ICD-10-CM

## 2021-11-28 DIAGNOSIS — N2581 Secondary hyperparathyroidism of renal origin: Secondary | ICD-10-CM | POA: Diagnosis present

## 2021-11-28 DIAGNOSIS — E8809 Other disorders of plasma-protein metabolism, not elsewhere classified: Secondary | ICD-10-CM | POA: Diagnosis present

## 2021-11-28 DIAGNOSIS — J439 Emphysema, unspecified: Secondary | ICD-10-CM | POA: Diagnosis present

## 2021-11-28 DIAGNOSIS — I5032 Chronic diastolic (congestive) heart failure: Secondary | ICD-10-CM

## 2021-11-28 DIAGNOSIS — E778 Other disorders of glycoprotein metabolism: Secondary | ICD-10-CM | POA: Diagnosis present

## 2021-11-28 DIAGNOSIS — R9389 Abnormal findings on diagnostic imaging of other specified body structures: Secondary | ICD-10-CM

## 2021-11-28 DIAGNOSIS — Z7189 Other specified counseling: Secondary | ICD-10-CM | POA: Diagnosis not present

## 2021-11-28 DIAGNOSIS — Z7901 Long term (current) use of anticoagulants: Secondary | ICD-10-CM | POA: Diagnosis not present

## 2021-11-28 DIAGNOSIS — C3490 Malignant neoplasm of unspecified part of unspecified bronchus or lung: Secondary | ICD-10-CM | POA: Diagnosis present

## 2021-11-28 DIAGNOSIS — J9621 Acute and chronic respiratory failure with hypoxia: Secondary | ICD-10-CM | POA: Diagnosis present

## 2021-11-28 DIAGNOSIS — E872 Acidosis, unspecified: Secondary | ICD-10-CM | POA: Diagnosis present

## 2021-11-28 DIAGNOSIS — J9622 Acute and chronic respiratory failure with hypercapnia: Secondary | ICD-10-CM | POA: Diagnosis present

## 2021-11-28 DIAGNOSIS — K922 Gastrointestinal hemorrhage, unspecified: Secondary | ICD-10-CM | POA: Diagnosis present

## 2021-11-28 DIAGNOSIS — M4856XA Collapsed vertebra, not elsewhere classified, lumbar region, initial encounter for fracture: Secondary | ICD-10-CM | POA: Diagnosis present

## 2021-11-28 DIAGNOSIS — Z20822 Contact with and (suspected) exposure to covid-19: Secondary | ICD-10-CM | POA: Diagnosis present

## 2021-11-28 DIAGNOSIS — Z8701 Personal history of pneumonia (recurrent): Secondary | ICD-10-CM | POA: Diagnosis not present

## 2021-11-28 LAB — CBC WITH DIFFERENTIAL/PLATELET
Abs Immature Granulocytes: 0.08 10*3/uL — ABNORMAL HIGH (ref 0.00–0.07)
Basophils Absolute: 0 10*3/uL (ref 0.0–0.1)
Basophils Relative: 0 %
Eosinophils Absolute: 0.1 10*3/uL (ref 0.0–0.5)
Eosinophils Relative: 1 %
HCT: 22.5 % — ABNORMAL LOW (ref 39.0–52.0)
Hemoglobin: 7.5 g/dL — ABNORMAL LOW (ref 13.0–17.0)
Immature Granulocytes: 1 %
Lymphocytes Relative: 8 %
Lymphs Abs: 0.8 10*3/uL (ref 0.7–4.0)
MCH: 35.5 pg — ABNORMAL HIGH (ref 26.0–34.0)
MCHC: 33.3 g/dL (ref 30.0–36.0)
MCV: 106.6 fL — ABNORMAL HIGH (ref 80.0–100.0)
Monocytes Absolute: 1 10*3/uL (ref 0.1–1.0)
Monocytes Relative: 10 %
Neutro Abs: 8.1 10*3/uL — ABNORMAL HIGH (ref 1.7–7.7)
Neutrophils Relative %: 80 %
Platelets: 165 10*3/uL (ref 150–400)
RBC: 2.11 MIL/uL — ABNORMAL LOW (ref 4.22–5.81)
RDW: 16.6 % — ABNORMAL HIGH (ref 11.5–15.5)
WBC: 10.2 10*3/uL (ref 4.0–10.5)
nRBC: 0 % (ref 0.0–0.2)

## 2021-11-28 LAB — COMPREHENSIVE METABOLIC PANEL
ALT: 13 U/L (ref 0–44)
AST: 23 U/L (ref 15–41)
Albumin: 2 g/dL — ABNORMAL LOW (ref 3.5–5.0)
Alkaline Phosphatase: 70 U/L (ref 38–126)
Anion gap: 15 (ref 5–15)
BUN: 57 mg/dL — ABNORMAL HIGH (ref 8–23)
CO2: 26 mmol/L (ref 22–32)
Calcium: 8.2 mg/dL — ABNORMAL LOW (ref 8.9–10.3)
Chloride: 92 mmol/L — ABNORMAL LOW (ref 98–111)
Creatinine, Ser: 7.99 mg/dL — ABNORMAL HIGH (ref 0.61–1.24)
GFR, Estimated: 6 mL/min — ABNORMAL LOW (ref >60)
Glucose, Bld: 92 mg/dL (ref 70–99)
Potassium: 2.9 mmol/L — ABNORMAL LOW (ref 3.5–5.1)
Sodium: 133 mmol/L — ABNORMAL LOW (ref 135–145)
Total Bilirubin: 1.1 mg/dL (ref 0.3–1.2)
Total Protein: 5 g/dL — ABNORMAL LOW (ref 6.5–8.1)

## 2021-11-28 LAB — POTASSIUM: Potassium: 3.1 mmol/L — ABNORMAL LOW (ref 3.5–5.1)

## 2021-11-28 LAB — PREPARE RBC (CROSSMATCH)

## 2021-11-28 LAB — LACTIC ACID, PLASMA: Lactic Acid, Venous: 2.3 mmol/L (ref 0.5–1.9)

## 2021-11-28 MED ORDER — HEPARIN 1000 UNIT/ML FOR PERITONEAL DIALYSIS
500.0000 [IU] | INTRAMUSCULAR | Status: DC | PRN
  Filled 2021-11-28: qty 0.5

## 2021-11-28 MED ORDER — HYDROMORPHONE HCL 1 MG/ML IJ SOLN
1.0000 mg | Freq: Once | INTRAMUSCULAR | Status: AC | PRN
  Administered 2021-11-28: 1 mg via INTRAVENOUS
  Filled 2021-11-28: qty 1

## 2021-11-28 MED ORDER — MIDODRINE HCL 5 MG PO TABS: 5.0000 mg | ORAL_TABLET | Freq: Every evening | ORAL | Status: DC | PRN

## 2021-11-28 MED ORDER — SODIUM CHLORIDE 0.9% IV SOLUTION: Freq: Once | INTRAVENOUS | Status: AC

## 2021-11-28 MED ORDER — SEVELAMER CARBONATE 800 MG PO TABS
800.0000 mg | ORAL_TABLET | Freq: Three times a day (TID) | ORAL | Status: DC
  Administered 2021-11-28 – 2021-12-04 (×16): 800 mg via ORAL
  Filled 2021-11-28 (×17): qty 1

## 2021-11-28 MED ORDER — VANCOMYCIN VARIABLE DOSE PER UNSTABLE RENAL FUNCTION (PHARMACIST DOSING)
Status: DC
Start: 2021-11-28 — End: 2021-12-04

## 2021-11-28 MED ORDER — GENTAMICIN SULFATE 0.1 % EX CREA
1.0000 "application " | TOPICAL_CREAM | Freq: Every day | CUTANEOUS | Status: DC
  Administered 2021-11-29 – 2021-12-04 (×7): 1 via TOPICAL
  Filled 2021-11-28 (×3): qty 15

## 2021-11-28 MED ORDER — PIPERACILLIN-TAZOBACTAM IN DEX 2-0.25 GM/50ML IV SOLN
2.2500 g | Freq: Three times a day (TID) | INTRAVENOUS | Status: DC
  Administered 2021-11-28 – 2021-12-04 (×18): 2.25 g via INTRAVENOUS
  Filled 2021-11-28 (×20): qty 50

## 2021-11-28 MED ORDER — SODIUM CHLORIDE 0.9 % IV BOLUS
500.0000 mL | Freq: Once | INTRAVENOUS | Status: AC
Start: 2021-11-28 — End: 2021-11-28
  Administered 2021-11-28: 500 mL via INTRAVENOUS

## 2021-11-28 MED ORDER — DELFLEX-LC/1.5% DEXTROSE 344 MOSM/L IP SOLN: INTRAPERITONEAL | Status: DC

## 2021-11-28 MED ORDER — ACETAMINOPHEN 325 MG PO TABS: 650.0000 mg | ORAL_TABLET | Freq: Four times a day (QID) | ORAL | Status: DC | PRN

## 2021-11-28 MED ORDER — ACETAMINOPHEN 650 MG RE SUPP: 650.0000 mg | Freq: Four times a day (QID) | RECTAL | Status: DC | PRN

## 2021-11-28 MED ORDER — POLYETHYLENE GLYCOL 3350 17 G PO PACK: 17.0000 g | PACK | Freq: Every day | ORAL | Status: DC | PRN

## 2021-11-28 MED ORDER — ONDANSETRON HCL 4 MG/2ML IJ SOLN
4.0000 mg | Freq: Four times a day (QID) | INTRAMUSCULAR | Status: DC | PRN
  Administered 2021-11-30: 4 mg via INTRAVENOUS

## 2021-11-28 MED ORDER — DELFLEX-LC/2.5% DEXTROSE 394 MOSM/L IP SOLN: INTRAPERITONEAL | Status: DC

## 2021-11-28 MED ORDER — POTASSIUM CHLORIDE CRYS ER 20 MEQ PO TBCR
20.0000 meq | EXTENDED_RELEASE_TABLET | Freq: Two times a day (BID) | ORAL | Status: DC
  Administered 2021-11-28 (×2): 20 meq via ORAL
  Filled 2021-11-28 (×2): qty 1

## 2021-11-28 MED ORDER — DELFLEX-LC/1.5% DEXTROSE 344 MOSM/L IP SOLN
INTRAPERITONEAL | Status: DC
  Administered 2021-11-30 – 2021-12-02 (×3): 5000 mL via INTRAPERITONEAL

## 2021-11-28 MED ORDER — OXYCODONE HCL 5 MG PO TABS
5.0000 mg | ORAL_TABLET | ORAL | Status: DC | PRN
  Administered 2021-11-28 – 2021-11-30 (×5): 5 mg via ORAL
  Filled 2021-11-28 (×5): qty 1

## 2021-11-28 MED ORDER — ONDANSETRON HCL 4 MG PO TABS: 4.0000 mg | ORAL_TABLET | Freq: Four times a day (QID) | ORAL | Status: DC | PRN

## 2021-11-28 MED ORDER — POLYETHYLENE GLYCOL 3350 17 G PO PACK
17.0000 g | PACK | Freq: Every day | ORAL | Status: DC
  Filled 2021-11-28: qty 1

## 2021-11-28 MED ORDER — ACETAMINOPHEN 325 MG PO TABS
650.0000 mg | ORAL_TABLET | Freq: Once | ORAL | Status: AC
  Administered 2021-11-28: 650 mg via ORAL
  Filled 2021-11-28: qty 2

## 2021-11-28 MED ORDER — VANCOMYCIN HCL 1500 MG/300ML IV SOLN
1500.0000 mg | Freq: Once | INTRAVENOUS | Status: AC
  Administered 2021-11-28: 1500 mg via INTRAVENOUS
  Filled 2021-11-28: qty 300

## 2021-11-28 MED ORDER — DIPHENHYDRAMINE HCL 25 MG PO CAPS
25.0000 mg | ORAL_CAPSULE | Freq: Once | ORAL | Status: AC
  Administered 2021-11-28: 25 mg via ORAL
  Filled 2021-11-28: qty 1

## 2021-11-28 MED ORDER — PANTOPRAZOLE SODIUM 40 MG IV SOLR
40.0000 mg | Freq: Two times a day (BID) | INTRAVENOUS | Status: DC
  Administered 2021-11-28 – 2021-12-01 (×7): 40 mg via INTRAVENOUS
  Filled 2021-11-28 (×7): qty 40

## 2021-11-28 NOTE — Assessment & Plan Note (Signed)
Patient had heme positive stools in the ER.  We will need to hold his Eliquis.  Patient will need to see GI consultation at some point.  Patient has not established with a GI group.

## 2021-11-28 NOTE — Progress Notes (Signed)
PROGRESS NOTE   David Barton  VOJ:[id number] DOB: 1933-09-07 DOA: 11/27/2021 PCP: Chesley Noon, MD  Brief Narrative:  87 year old white male home dwelling known COPD emphysema respiratory failure 4 L oxygen for the past month-prior Pleural effusions 2020 Permanent A. 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 difficile 2017 OSA Seen in pulmonary office 11/27/2021 after being treated on 11/25 with Augmentin and steroids-he completed those but experienced worsening once again-x-ray in office showed right upper lobe airspace disease?  Multi lobar pneumonia patient was referred to ED On work-up found to have 25% L1 compression fracture Also found to have hemoglobin down from 10-8.2 with guaiac positive stools  Hospital-Problem based course  Outpatient failed Augmentin treatment pneumonia ?  Postobstructive pneumonia  with loculated effusion Continue Zosyn and vancomycin started 12/10 Will need input from pulmonary--?  Bronchoscopy rule out malignancy and postobstructive pneumonia Continue oxygen  4L try to wean as tolerated Heme positive stool with anemia Last BM unformed 2 to 3 days back,  wife iondicates started after abxr Eliquis 2.5 twice daily from prior to admission held As he is not acutely bleeding will reassess in 1-2 days after transfsion Epistaxis on 11/26 Acute blood loss anemia Profuse bleeding Thanksgiving evening ~ 8 hours which finally stopped Could be cause for anemia Transfuse 1 U prbc 12/10 ESRD on peritoneal dialysis hypokalemia Replacing potassium with 20 mEq twice daily Rest as per renal in terms of dialysis peritoneal dialysate Permanent A. fib CHADS2 score >4 previously on Xarelto In sinus tach right now-not on any rate controlling agent, presumably because of low normal blood pressures-MAP is about 67 Monitor on telemetry Xarelto on hold for now L1  fracture Mobilize with PT/OT No a current candidate for Kypho  DVT prophylaxis: scd Code Status: f Family Communication: wife Archie Patten [phone] Disposition:  Status is: Inpatient  Remains inpatient appropriate because: need for work up       Consultants:  pulm  Procedures:   Antimicrobials: vanc zosyn from admit    Subjective: No dark tarry stool No cp, fever chills No n/v   Objective: Vitals:   11/28/21 0327 11/28/21 0600 11/28/21 0623 11/28/21 0723  BP: (!) 88/62  (!) 102/53 (!) 92/52  Pulse: (!) 113 (!) 101 (!) 103 95  Resp: 18   17  Temp: 98.2 F (36.8 C)   (!) 97.5 F (36.4 C)  TempSrc: Oral   Oral  SpO2: 96%  98% 91%  Weight: 76.6 kg     Height:        Intake/Output Summary (Last 24 hours) at 11/28/2021 0807 Last data filed at 11/28/2021 0751 Gross per 24 hour  Intake 971.92 ml  Output --  Net 971.92 ml   Filed Weights   11/27/21 1720 11/28/21 0327  Weight: 84.4 kg 76.6 kg    Examination:  Eomi ncat frial cachectic WM no distress on oxygen Ctab no rales rhonchi Abd soft nt nd no rebound No le edema Neuro 5/5 power  Data Reviewed: personally reviewed   CBC    Component Value Date/Time   WBC 10.2 11/28/2021 0441   RBC 2.11 (L) 11/28/2021 0441   HGB 7.5 (L) 11/28/2021 0441   HGB 14.1 12/21/2018 1038   HGB 14.9 11/24/2009 0958   HCT 22.5 (L) 11/28/2021 0441   HCT 30.1 (L) 06/09/2019 1329   HCT 43.8 11/24/2009 0958   PLT 165 11/28/2021 0441  PLT 276 12/21/2018 1038   MCV 106.6 (H) 11/28/2021 0441   MCV 101 (H) 12/21/2018 1038   MCV 108.6 (H) 11/24/2009 0958   MCH 35.5 (H) 11/28/2021 0441   MCHC 33.3 11/28/2021 0441   RDW 16.6 (H) 11/28/2021 0441   RDW 12.5 12/21/2018 1038   RDW 15.4 (H) 11/24/2009 0958   LYMPHSABS 0.8 11/28/2021 0441   LYMPHSABS 2.2 11/24/2009 0958   MONOABS 1.0 11/28/2021 0441   MONOABS 0.6 11/24/2009 0958   EOSABS 0.1 11/28/2021 0441   EOSABS 0.2 11/24/2009 0958   BASOSABS 0.0 11/28/2021 0441    BASOSABS 0.0 11/24/2009 0958   CMP Latest Ref Rng & Units 11/28/2021 11/27/2021 01/19/2021  Glucose 70 - 99 mg/dL 92 92 90  BUN 8 - 23 mg/dL 57(H) 55(H) 45(H)  Creatinine 0.61 - 1.24 mg/dL 7.99(H) 7.24(H) 7.99(HH)  Sodium 135 - 145 mmol/L 133(L) 135 131(L)  Potassium 3.5 - 5.1 mmol/L 2.9(L) 3.1(L) 3.8  Chloride 98 - 111 mmol/L 92(L) 93(L) 90(L)  CO2 22 - 32 mmol/L 26 26 23   Calcium 8.9 - 10.3 mg/dL 8.2(L) 9.1 8.9  Total Protein 6.5 - 8.1 g/dL 5.0(L) 6.0(L) -  Total Bilirubin 0.3 - 1.2 mg/dL 1.1 0.5 -  Alkaline Phos 38 - 126 U/L 70 72 -  AST 15 - 41 U/L 23 21 -  ALT 0 - 44 U/L 13 10 -     Radiology Studies: DG Chest 2 View  Result Date: 11/27/2021 CLINICAL DATA:  Pleural effusion EXAM: CHEST - 2 VIEW COMPARISON:  11/13/2021 FINDINGS: Right lower lobe and to a lesser extent right upper lobe airspace disease concerning for multilobar pneumonia. Small right pleural effusion. Mild left basilar atelectasis. No pneumothorax. Stable cardiomediastinal silhouette. No acute osseous abnormality. IMPRESSION: Right lower lobe and to a lesser extent right upper lobe airspace disease concerning for multilobar pneumonia. Electronically Signed   By: Kathreen Devoid M.D.   On: 11/27/2021 15:53   CT Lumbar Spine Wo Contrast  Result Date: 11/27/2021 CLINICAL DATA:  Initial evaluation for acute low back pain, increased fracture risk. EXAM: CT LUMBAR SPINE WITHOUT CONTRAST TECHNIQUE: Multidetector CT imaging of the lumbar spine was performed without intravenous contrast administration. Multiplanar CT image reconstructions were also generated. COMPARISON:  None available. FINDINGS: Segmentation: Standard. Lowest well-formed disc space labeled the L5-S1 level. Alignment: Mild sigmoid scoliosis. Trace stepwise retrolisthesis of L2 on L3 through L4 on L5. Vertebrae: Acute to subacute compression fracture involving the superior endplate of L1 with up to 25% height loss without bony retropulsion. Vertebral body height  otherwise maintained. Visualized sacrum and pelvis intact. SI joints symmetric and within normal limits. No discrete or worrisome osseous lesions. Paraspinal and other soft tissues: Mild paraspinous edema adjacent to the L1 fracture. Chronic fatty atrophy noted with throughout the posterior paraspinous and gluteal musculature. Advanced aorto bi-iliac atherosclerotic disease. Right pleural effusion partially visualized. Disc levels: L1-2: Disc desiccation with mild disc bulge. Mild-to-moderate right worse than left facet hypertrophy. No spinal stenosis. Foramina remain patent. L2-3: Trace retrolisthesis with intervertebral disc space narrowing. Diffuse disc bulge without focal disc protrusion. Moderate facet hypertrophy. Mild bilateral lateral recess stenosis. Foramina remain patent. L3-4: Degenerative intervertebral disc space narrowing with retrolisthesis. Diffuse disc bulge with disc desiccation, eccentric to the right. Associated reactive endplate spurring. Moderate bilateral facet hypertrophy. Resultant mild canal with moderate bilateral lateral recess stenosis. Moderate bilateral L3 foraminal stenosis. L4-5: Degenerative intervertebral disc space narrowing with diffuse disc bulge and disc desiccation. Reactive endplate spurring. Moderate facet and  ligament flavum hypertrophy. Moderate canal with bilateral subarticular stenosis. Moderate to severe bilateral L4 foraminal stenosis. L5-S1: Degenerative intervertebral disc space narrowing with diffuse disc bulge and disc desiccation. Reactive endplate spurring. Superimposed central to left subarticular disc protrusion with annular calcification (series 4, image 102). Protruding disc contacts and/or closely approximates both of the descending S1 nerve roots without frank impingement or displacement. Mild facet hypertrophy. No significant spinal stenosis. Moderate bilateral L5 foraminal narrowing. IMPRESSION: 1. Acute to subacute compression fracture involving the  superior endplate of L1 with up to 25% height loss without bony retropulsion. 2. Multilevel degenerative spondylosis and facet arthrosis as above. Resultant mild to moderate canal and bilateral subarticular stenosis at L2-3 through L4-5, with moderate to severe bilateral L3 through L5 foraminal narrowing as above. 3. Right pleural effusion, partially visualized. 4. Aortic Atherosclerosis (ICD10-I70.0). Electronically Signed   By: Jeannine Boga M.D.   On: 11/27/2021 19:48     Scheduled Meds:  gentamicin cream  1 application Topical Daily   pantoprazole (PROTONIX) IV  40 mg Intravenous Q12H   polyethylene glycol  17 g Oral Daily   potassium chloride  20 mEq Oral BID   sevelamer carbonate  800 mg Oral TID WC   vancomycin variable dose per unstable renal function (pharmacist dosing)   Does not apply See admin instructions   Continuous Infusions:  piperacillin-tazobactam (ZOSYN)  IV Stopped (11/28/21 0634)     LOS: 0 days   Time spent: [place], MD [facility] To contact the attending provider between 7A-7P or the covering provider during after hours 7P-7A, please log into the web site [email or web address] and access using universal [place] password for that web site. If you do not have the password, please call the hospital operator.  11/28/2021, 8:07 AM

## 2021-11-28 NOTE — Assessment & Plan Note (Signed)
Holding Eliquis due to GI bleed.

## 2021-11-28 NOTE — Assessment & Plan Note (Signed)
Give small 500 cc bolus.  Given his hypoproteinemia and hypoalbuminemia, patient may benefit from colloid infusion.  We will see what happens with giving him crystalloid bolus first

## 2021-11-28 NOTE — ED Notes (Signed)
Called [facility] to transport patient David Barton 63M room 16

## 2021-11-28 NOTE — Assessment & Plan Note (Signed)
Does not appear volume loaded at this time.

## 2021-11-28 NOTE — Assessment & Plan Note (Signed)
Patient uses peritoneal dialysis.  He has not had any peritoneal dialysis this evening.  We will need indwelling dialysate today.  Please contact nephrology this morning.

## 2021-11-28 NOTE — Assessment & Plan Note (Signed)
Patient noted to have an L1 compression fracture.  IV Dilaudid as needed.

## 2021-11-28 NOTE — Assessment & Plan Note (Signed)
Chronic A. fib.  Will need to hold his Eliquis due to GI bleeding.

## 2021-11-28 NOTE — Progress Notes (Addendum)
Pharmacy Antibiotic Note  David Barton is a 87 y.o. male admitted on 11/27/2021 with pneumonia; pt had been on ABX as outpt without improvement, new CXR shows persistent lower-lobe PNA and new upper-lobe PNA.  Pharmacy has been consulted for vancomycin and Zosyn dosing.  Pt on PD for ESRD.  Plan: Vancomycin 1500mg  IV x1; monitor vanc levels for redosing. Zosyn 2.25g IV Q8H.  Height: 5\' 10"  (177.8 cm) Weight: 76.6 kg (168 lb 14 oz) IBW/kg (Calculated) : 73  Temp (24hrs), Avg:98.1 F (36.7 C), Min:97.7 F (36.5 C), Max:98.3 F (36.8 C)  Recent Labs  Lab 11/27/21 1828  WBC 12.0*  CREATININE 7.24*  LATICACIDVEN 1.3    Estimated Creatinine Clearance: 7.7 mL/min (A) (by C-G formula based on SCr of 7.24 mg/dL (H)).    Allergies  Allergen Reactions   Amlodipine Swelling   Tizanidine Other (See Comments)    Other reaction(s): hypotension    Thank you for allowing pharmacy to be a part of this patient's care.  Wynona Neat, PharmD, BCPS  11/28/2021 4:23 AM

## 2021-11-28 NOTE — Consult Note (Addendum)
[facility] Renal Consultation Note    Indication for Consultation:  Management of ESRD/hemodialysis; anemia, hypertension/volume and secondary hyperparathyroidism  HFW:YOVZCH, Rebeca Alert, MD  HPI: David Barton is a 87 y.o. malewith ESRD onPD 7x week. Past medical history significant for COPD/emphysema, chronic respiratory failure oxygen dependent, A. 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    Patient seen and examined at bedside in room with wife present.  Presented to the ED yesterday due to shortness of breath and confusion.  States he was started on oxygen about a month ago.  Reports receiving antibiotics for PNA about 2 weeks and completed 7 day course of Augmentin.  Continued to have SOB, weakness and fatigue as well as severe back pain so he came to the ED.  Back pain has worsened over the last few days making it difficult to move and especially walk.  Denies injury, fevers, chills, abdominal pain, n/v/d, and falls.    Of note patient has been having issues with drainage of PD catheter for about 6 -8 months.  He was evaluated by Dr. Raul Del and had CT scan which showed proper placement.  Reports symptoms improved with using 2.5% dialysate but continues to be issue from time to time.  States PD nurse believes it may be due to constipation.  Stopped taking daily miralax while on antibiotics due to watery diarrhea and has not resumed.  No BM for last 2 days.    Pertinent findings during work up include hypoxia with increased O2 requirement 4L, hypotension, negative respiratory panel, K 2.9, BUN 57, Hgb 7.5, +FOBT, lactic acid 2.3, CXR concerning for multilobular PNA, CT lumbar spine with acute compression fracture of L1, CT chest with "moderate size complex and loculated right pleural  effusion suspicious for empyema and Large right upper lobe airspace opacity consistent with pneumonia," as well as multiple enlarged lymph nodes.  Patient has been admitted for further evaluation and management.   Past Medical History:  Diagnosis Date   Alcohol abuse    Colon cancer ([place])    COPD (chronic obstructive pulmonary disease) ([place])    Emphysema of lung ([place])    Enteritis due to Clostridium difficile    ESRD (end stage renal disease) ([place])    Former tobacco use    Hypertension    Peripheral edema    Sepsis ([place]) 10/2016   Aspiration PNA/C diff colitis   Past Surgical History:  Procedure Laterality Date   A/V FISTULAGRAM N/A 10/19/2019   Procedure: A/V FISTULAGRAM- Left Arm;  Surgeon: Angelia Mould, MD;  Location: [facility];  Service: Cardiovascular;  Laterality: N/A;   AV FISTULA PLACEMENT Left 06/14/2019   Procedure: Creation of Left arm arteriovenous fistula;  Surgeon: Rosetta Posner, MD;  Location: [place];  Service: Vascular;  Laterality: Left;   CARDIOVERSION N/A 03/16/2019   Procedure: CARDIOVERSION;  Surgeon: Pixie Casino, MD;  Location: [place];  Service: Cardiovascular;  Laterality: N/A;   CARDIOVERSION N/A 03/23/2019   Procedure: CARDIOVERSION;  Surgeon: Lelon Perla, MD;  Location: [facility] ENDOSCOPY;  Service: Cardiovascular;  Laterality: N/A;   CATARACT EXTRACTION     COLON RESECTION     COLONOSCOPY     IMPLANTATION BONE ANCHORED HEARING AID Left 2016   IR FLUORO GUIDE CV LINE RIGHT  06/09/2019   IR THORACENTESIS ASP  PLEURAL SPACE W/IMG GUIDE  05/21/2019   IR THORACENTESIS ASP PLEURAL SPACE W/IMG GUIDE  10/19/2021   IR US GUIDE VASC ACCESS RIGHT  06/09/2019   MINOR HEMORRHOIDECTOMY  1995   TEE WITHOUT CARDIOVERSION N/A 03/16/2019   Procedure: TRANSESOPHAGEAL ECHOCARDIOGRAM (TEE);  Surgeon: Pixie Casino, MD;  Location: [facility] ENDOSCOPY;  Service: Cardiovascular;  Laterality: N/A;   TONSILLECTOMY     Family History  Problem Relation Age of  Onset   Brain cancer Father    Colon cancer Mother        with mets to liver   Lung cancer Brother        was a smoker   Social History:  reports that he quit smoking about 59 years ago. His smoking use included cigarettes. He has a 20.00 pack-year smoking history. He has never used smokeless tobacco. He reports current alcohol use of about 7.0 standard drinks per week. He reports that he does not use drugs. Allergies  Allergen Reactions   Amlodipine Swelling   Tizanidine Other (See Comments)    Other reaction(s): hypotension   Prior to Admission medications   Medication Sig Start Date End Date Taking? Authorizing Provider  acetaminophen (TYLENOL) 325 MG tablet Take 650 mg by mouth every 6 (six) hours as needed.    [provider]  albuterol (PROAIR HFA) 108 (90 Base) MCG/ACT inhaler 2 puffs every 4 hours as needed only  if your can't catch your breath 11/09/21   Tanda Rockers, MD  apixaban (ELIQUIS) 2.5 MG TABS tablet TAKE 1 TABLET(2.5 MG) BY MOUTH TWICE DAILY 08/17/21   Lorretta Harp, MD  calcitRIOL (ROCALTROL) 0.5 MCG capsule Take 0.5 mcg by mouth daily. 10/29/21   [provider]  famotidine (PEPCID) 20 MG tablet One after supper Patient not taking: Reported on 11/27/2021 01/19/21   Tanda Rockers, MD  levalbuterol Penne Lash) 0.63 MG/3ML nebulizer solution Take 3 mLs (0.63 mg total) by nebulization every 6 (six) hours as needed for wheezing or shortness of breath. 11/13/21   Cobb, Karie Schwalbe, NP  multivitamin (RENA-VIT) TABS tablet Take 1 tablet by mouth at bedtime. 06/15/19   Mercy Riding, MD  pantoprazole (PROTONIX) 40 MG tablet Take 1 tablet (40 mg total) by mouth daily. Take 30-60 min before first meal of the day Patient not taking: Reported on 11/27/2021 01/19/21   Tanda Rockers, MD  sevelamer carbonate (RENVELA) 800 MG tablet Take 1 tablet (800 mg total) by mouth 3 (three) times daily with meals. 06/15/19   Mercy Riding, MD  TRELEGY ELLIPTA 100-62.5-25  MCG/INH AEPB INHALE 1 PUFF INTO THE LUNGS DAILY 04/27/21   Martyn Ehrich, NP  zolpidem (AMBIEN) 10 MG tablet Take 10 mg by mouth at bedtime as needed. 12/11/20   [provider]   Current Facility-Administered Medications  Medication Dose Route Frequency Provider Last Rate Last Admin   0.9 %  sodium chloride infusion (Manually program via Guardrails IV Fluids)   Intravenous Once Nita Sells, MD       acetaminophen (TYLENOL) tablet 650 mg  650 mg Oral Q6H PRN Kristopher Oppenheim, DO       Or   acetaminophen (TYLENOL) suppository 650 mg  650 mg Rectal Q6H PRN Kristopher Oppenheim, DO       acetaminophen (TYLENOL) tablet 650 mg  650 mg Oral Once Nita Sells, MD       dialysis solution 1.5% low-MG/low-CA dianeal solution   Intraperitoneal Q24H Penninger, Ria Comment, [place]  dialysis solution 2.5% low-MG/low-CA dianeal solution   Intraperitoneal Q24H Penninger, Ria Comment, [place]       diphenhydrAMINE (BENADRYL) capsule 25 mg  25 mg Oral Once Nita Sells, MD       gentamicin cream (GARAMYCIN) 0.1 % 1 application  1 application Topical Daily Penninger, Ria Comment, PA       heparin 1000 unit/ml injection 500 Units  500 Units Intraperitoneal PRN Penninger, Ria Comment, PA       HYDROmorphone (DILAUDID) injection 1 mg  1 mg Intravenous Once PRN Kristopher Oppenheim, DO       ipratropium-albuterol (DUONEB) 0.5-2.5 (3) MG/3ML nebulizer solution 3 mL  3 mL Nebulization Q2H PRN Davonna Belling, MD   3 mL at 11/27/21 1735   ondansetron (ZOFRAN) tablet 4 mg  4 mg Oral Q6H PRN Kristopher Oppenheim, DO       Or   ondansetron [facility]) injection 4 mg  4 mg Intravenous Q6H PRN Kristopher Oppenheim, DO       oxyCODONE (Oxy IR/ROXICODONE) immediate release tablet 5 mg  5 mg Oral Q4H PRN Kristopher Oppenheim, DO   5 mg at 11/28/21 0452   pantoprazole (PROTONIX) injection 40 mg  40 mg Intravenous Q12H Kristopher Oppenheim, DO   40 mg at 11/28/21 0929   piperacillin-tazobactam (ZOSYN) IVPB 2.25 g  2.25 g Intravenous Q8H Laren Everts, RPH   Stopped at 11/28/21  7106   polyethylene glycol (MIRALAX / GLYCOLAX) packet 17 g  17 g Oral Daily PRN Penninger, Ria Comment, PA       sevelamer carbonate (RENVELA) tablet 800 mg  800 mg Oral TID WC Kristopher Oppenheim, DO       vancomycin variable dose per unstable renal function (pharmacist dosing)   Does not apply See admin instructions Laren Everts, [facility]       Labs: Basic Metabolic Panel: Recent Labs  Lab 11/27/21 1828 11/28/21 0441  NA 135 133*  K 3.1* 2.9*  CL 93* 92*  CO2 26 26  GLUCOSE 92 92  BUN 55* 57*  CREATININE 7.24* 7.99*  CALCIUM 9.1 8.2*   Liver Function Tests: Recent Labs  Lab 11/27/21 1828 11/28/21 0441  AST 21 23  ALT 10 13  ALKPHOS 72 70  BILITOT 0.5 1.1  PROT 6.0* 5.0*  ALBUMIN 3.1* 2.0*   CBC: Recent Labs  Lab 11/27/21 1828 11/28/21 0441  WBC 12.0* 10.2  NEUTROABS 9.6* 8.1*  HGB 8.2* 7.5*  HCT 25.2* 22.5*  MCV 105.4* 106.6*  PLT 182 165   Studies/Results: DG Chest 2 View  Result Date: 11/27/2021 CLINICAL DATA:  Pleural effusion EXAM: CHEST - 2 VIEW COMPARISON:  11/13/2021 FINDINGS: Right lower lobe and to a lesser extent right upper lobe airspace disease concerning for multilobar pneumonia. Small right pleural effusion. Mild left basilar atelectasis. No pneumothorax. Stable cardiomediastinal silhouette. No acute osseous abnormality. IMPRESSION: Right lower lobe and to a lesser extent right upper lobe airspace disease concerning for multilobar pneumonia. Electronically Signed   By: Kathreen Devoid M.D.   On: 11/27/2021 15:53   CT Lumbar Spine Wo Contrast  Result Date: 11/27/2021 CLINICAL DATA:  Initial evaluation for acute low back pain, increased fracture risk. EXAM: CT LUMBAR SPINE WITHOUT CONTRAST TECHNIQUE: Multidetector CT imaging of the lumbar spine was performed without intravenous contrast administration. Multiplanar CT image reconstructions were also generated. COMPARISON:  None available. FINDINGS: Segmentation: Standard. Lowest well-formed disc space labeled the L5-S1  level. Alignment: Mild sigmoid scoliosis. Trace stepwise retrolisthesis of L2 on L3 through L4 on L5.  Vertebrae: Acute to subacute compression fracture involving the superior endplate of L1 with up to 25% height loss without bony retropulsion. Vertebral body height otherwise maintained. Visualized sacrum and pelvis intact. SI joints symmetric and within normal limits. No discrete or worrisome osseous lesions. Paraspinal and other soft tissues: Mild paraspinous edema adjacent to the L1 fracture. Chronic fatty atrophy noted with throughout the posterior paraspinous and gluteal musculature. Advanced aorto bi-iliac atherosclerotic disease. Right pleural effusion partially visualized. Disc levels: L1-2: Disc desiccation with mild disc bulge. Mild-to-moderate right worse than left facet hypertrophy. No spinal stenosis. Foramina remain patent. L2-3: Trace retrolisthesis with intervertebral disc space narrowing. Diffuse disc bulge without focal disc protrusion. Moderate facet hypertrophy. Mild bilateral lateral recess stenosis. Foramina remain patent. L3-4: Degenerative intervertebral disc space narrowing with retrolisthesis. Diffuse disc bulge with disc desiccation, eccentric to the right. Associated reactive endplate spurring. Moderate bilateral facet hypertrophy. Resultant mild canal with moderate bilateral lateral recess stenosis. Moderate bilateral L3 foraminal stenosis. L4-5: Degenerative intervertebral disc space narrowing with diffuse disc bulge and disc desiccation. Reactive endplate spurring. Moderate facet and ligament flavum hypertrophy. Moderate canal with bilateral subarticular stenosis. Moderate to severe bilateral L4 foraminal stenosis. L5-S1: Degenerative intervertebral disc space narrowing with diffuse disc bulge and disc desiccation. Reactive endplate spurring. Superimposed central to left subarticular disc protrusion with annular calcification (series 4, image 102). Protruding disc contacts and/or  closely approximates both of the descending S1 nerve roots without frank impingement or displacement. Mild facet hypertrophy. No significant spinal stenosis. Moderate bilateral L5 foraminal narrowing. IMPRESSION: 1. Acute to subacute compression fracture involving the superior endplate of L1 with up to 25% height loss without bony retropulsion. 2. Multilevel degenerative spondylosis and facet arthrosis as above. Resultant mild to moderate canal and bilateral subarticular stenosis at L2-3 through L4-5, with moderate to severe bilateral L3 through L5 foraminal narrowing as above. 3. Right pleural effusion, partially visualized. 4. Aortic Atherosclerosis (ICD10-I70.0). Electronically Signed   By: Jeannine Boga M.D.   On: 11/27/2021 19:48    ROS: All others negative except those listed in HPI.  Physical Exam: Vitals:   11/28/21 0327 11/28/21 0600 11/28/21 0623 11/28/21 0723  BP: (!) 88/62  (!) 102/53 (!) 92/52  Pulse: (!) 113 (!) 101 (!) 103 95  Resp: 18   17  Temp: 98.2 F (36.8 C)   (!) 97.5 F (36.4 C)  TempSrc: Oral   Oral  SpO2: 96%  98% 91%  Weight: 76.6 kg     Height:         General: chronically ill appearing male in NAD Head: NCAT sclera not icteric MMM Neck: Supple. No JVD. Lungs: Breath sounds decreased. No crackles. Heart: RRR. No murmur, rubs or gallops.  Abdomen: soft, nontender, +BS, no guarding, no rebound tenderness  Lower extremities:trace LE edema Neuro: AAOx3. Moves all extremities spontaneously. Psych:  Responds to questions appropriately with a normal affect. Dialysis Access: PD cath RUQ  Dialysis Orders:  CCPD - 7x week 84.5kg, 6 exchanges, fill vol 3L last fill 1.5L Dwell time 1h 19min  Assessment/Plan: Multifocal PNA - On Vanc and zosyn.   Possible empyema - noted on CT. Hx R pleural effusion requiring thoracentesis.  Per primary Hypokalemia - K+ trending down, last 2.8.  KCl 20mEq BID ordered.  Repeat K+ prior to 2nd dose and daily K levels.  L1  compression fracture - pain management per primary  ESRD -  on PD.  Orders placed for PD tonight.  Reported issues with drainage improved with  2.5% bags and possibly related to constipation.  Will plan to use all 1.5% bags due to hypotension.  Continue daily miralax.    Hypertension/volume  - Hypotensive on exam.  May need trial midodrine, will order dose for today prior to PD. Does not appear grossly volume overloaded.  UF as tolerated.   Acute on chronic anemia of CKD - Hgb 7.5 today. +FOBT.  1 unit pRBC ordered.  No mircera ordered as outpatient.  Will order iron studies.   Secondary Hyperparathyroidism -  Calcium at goal.  Check phos. Continue calcitriol and renvela.   Nutrition - Carb modified diet with fluid restrictions. Alb 2.0 - protein supplements.  Chronic respiratory failure - on 2-2.5L O2 at home,  currently requiring 3L A fib/CHF with EF 30% - on Eliquis.  Metoprolol d/c as outpatient due to hypotension  Jen Mow, PA-C [facility] 11/28/2021, 9:31 AM   Nephrology attending: I have personally seen and examined patient.  Chart reviewed and I agree with above.  ESRD on PD admitted with multifocal pneumonia.  Patient with hypertension, hypokalemia and currently receiving antibiotics.  We will continue PD tonight.  Stool softener for the management of constipation.  No abdominal pain or tenderness to suggest peritonitis. Patient is able to lie flat and looks comfortable.  Some crackles on bases.  Trace lower extremity edema.  Katheran James, MD [facility].

## 2021-11-28 NOTE — Assessment & Plan Note (Signed)
Verified DNR.  Patient has a scanned DNR form in the chart.

## 2021-11-28 NOTE — Subjective & Objective (Signed)
CC: sent to ER by pulmonary HPI: 87 year old male with a history of end-stage renal disease on peritoneal dialysis, COPD, recurrent pneumonia, A. fib on anticoagulation, presents to the ER from the pulmonary office.  Patient was sent to the ER due to pneumonia seen on chest x-ray.  Patient been treated previously in the last couple weeks for recurrent pneumonia.  Patient's had a productive cough.  He has felt weak.  Unsure if he has been having any fevers.  Patient denies having any falls.  Due to his pneumonia, patient transferred to [facility] for further evaluation.

## 2021-11-28 NOTE — Plan of Care (Signed)
  Problem: Education: Goal: Knowledge of General Education information will improve Description: Including pain rating scale, medication(s)/side effects and non-pharmacologic comfort measures Outcome: Progressing   Problem: Health Behavior/Discharge Planning: Goal: Ability to manage health-related needs will improve Outcome: Progressing   Problem: Clinical Measurements: Goal: Cardiovascular complication will be avoided Outcome: Progressing   Problem: Activity: Goal: Risk for activity intolerance will decrease Outcome: Progressing   Problem: Nutrition: Goal: Adequate nutrition will be maintained Outcome: Progressing   Problem: Coping: Goal: Level of anxiety will decrease Outcome: Progressing   Problem: Elimination: Goal: Will not experience complications related to bowel motility Outcome: Progressing   Problem: Pain Managment: Goal: General experience of comfort will improve Outcome: Progressing   Problem: Safety: Goal: Ability to remain free from injury will improve Outcome: Progressing   Problem: Skin Integrity: Goal: Risk for impaired skin integrity will decrease Outcome: Progressing

## 2021-11-28 NOTE — Assessment & Plan Note (Signed)
We will hold all his blood pressure medicines due to hypotension.

## 2021-11-28 NOTE — ACP (Advance Care Planning) (Signed)
  Advance Care Planning  Reason for Advance Care Planning Conversation: Acute hospitalization Principal Problem:   Multifocal pneumonia Active Problems:   Hypotension   GI bleed   ESRD (end stage renal disease) ([place])   Closed compression fracture of body of L1 vertebra ([name])   Essential hypertension   Chronic diastolic heart failure ([name])   Longstanding persistent atrial fibrillation ([name])   Chronic respiratory failure with hypoxia and hypercapnia ([place])   DNR (do not resuscitate)   Chronic anticoagulation    I discussed with patient about advance care planning. Specifically, we discussed whether patient would desire cardiopulmonary resuscitation (CPR) in the event of acute cardiopulmonary arrest. We also discussed whether endotracheal intubation and temporary ventilator life support would be desired in the event of acute cardio- or pulmonary decompensation.   Code status order of DNR has been entered in accord with the patient's wishes. no intubation  Living will: yes  [place] / Davonna Belling              Name: carole Smartt: Relationship to Patient: wife   Is agent appointed in legal document yes  Time spent today in ACP discussion was  5 mins  Kristopher Oppenheim, DO [facility]

## 2021-11-28 NOTE — Consult Note (Signed)
NAME:  David Barton, MRN:  [id number], DOB:  1933/08/09, LOS: 0 ADMISSION DATE:  11/27/2021, CONSULTATION DATE: 09/30/2021 REFERRING MD: [facility], CHIEF COMPLAINT: Back pain  History of Present Illness:  87 year old male former smoker patient of Dr. Christinia Gully who has ongoing COPD, emphysema O2 dependent at 3 to 4 L nasal cannula.  Constant chronic cough he has been worked up for dysphagia in the past swallowing evaluation without problem.  He is on peritoneal dialysis.  Reports that his weight is constant at approximately 87 kg.  Denies fevers chills sweats or productive cough.  He did have a thoracentesis done in October 2022.  His chief complaint was back pain.  He does have an L2 compression fracture.  CT evidence reveals left-sided pneumonia he is being evaluated for fiberoptic bronchoscopy on 09/30/2021 for visualization and clearing of suspected mucoid impaction.  There is some question of malignancy this will also be addressed at that time.  Pertinent  Medical History   Past Medical History:  Diagnosis Date   Alcohol abuse    Colon cancer ([place])    COPD (chronic obstructive pulmonary disease) ([place])    Emphysema of lung ([place])    Enteritis due to Clostridium difficile    ESRD (end stage renal disease) ([place])    Former tobacco use    Hypertension    Peripheral edema    Sepsis ([place]) 10/2016   Aspiration PNA/C diff colitis     Significant Hospital Events: Including procedures, antibiotic start and stop dates in addition to other pertinent events   11/28/2021 pulmonary consult plan for fiberoptic bronchoscopy 11/30/2021  Interim History / Subjective:  Admitted with acute on chronic hypoxic respiratory failure and back pain  Objective   Blood pressure (!) 98/53, pulse 98, temperature (!) 97.4 F (36.3 C), temperature source Oral, resp. rate 18, height 5\' 10"  (1.778 m), weight 76.6 kg, SpO2 96 %.    FiO2 (%):  [21 %-28 %] 28 %   Intake/Output Summary (Last 24 hours) at  11/28/2021 1530 Last data filed at 11/28/2021 1323 Gross per 24 hour  Intake 1166.92 ml  Output --  Net 1166.92 ml   Filed Weights   11/27/21 1720 11/28/21 0327  Weight: 84.4 kg 76.6 kg    Examination: General: Ill-appearing male obese HENT: No JVD or lymphadenopathy appreciated Lungs: Coarse rhonchi bilaterally diminished breath sounds in the bases very congested cough Cardiovascular: Heart sounds are distant Abdomen: Soft nontender peritoneal catheter in place Extremities: Mild edema Neuro: Grossly intact without focal defect GU: Anuretic  Resolved Hospital Problem list     Assessment & Plan:  Acute on chronic hypoxic respiratory failure multifocal.  Gold B COPD, emphysema, persistent pleural effusion, status post thoracentesis in September 28, 2021.  Multiple episodes of pneumonia recently treated with Augmentin.  Presented to the pulmonary office on 11/27/2021 increasing back pain, increasing shortness of breath, chronic cough and generalized failure to thrive.  Worrisome for malignancy. Agree with aggressive antimicrobial therapy Plan for fiberoptic bronchoscopy on Monday, 11/30/2021 Bronchodilators Dysphagia's study 10/09/2021 demonstrates no aspiration risk. He is on peritoneal dialysis and weight fluctuates on daily basis.  End-stage renal disease Continue peritoneal dialysis  Compression fractures at L1 Pain relief as needed Further outpatient evaluation  History of atrial fibrillation Currently on heparin drip.    Best Practice (right click and "Reselect all SmartList Selections" daily)   Diet/type: Regular consistency (see orders) DVT prophylaxis: systemic heparin GI prophylaxis: PPI Lines: N/A Foley:  N/A Code Status:  DNR  Last date of multidisciplinary goals of care discussion [tbd]  Labs   CBC: Recent Labs  Lab 11/27/21 1828 11/28/21 0441  WBC 12.0* 10.2  NEUTROABS 9.6* 8.1*  HGB 8.2* 7.5*  HCT 25.2* 22.5*  MCV 105.4* 106.6*  PLT 182 165     Basic Metabolic Panel: Recent Labs  Lab 11/27/21 1828 11/28/21 0441  NA 135 133*  K 3.1* 2.9*  CL 93* 92*  CO2 26 26  GLUCOSE 92 92  BUN 55* 57*  CREATININE 7.24* 7.99*  CALCIUM 9.1 8.2*   GFR: Estimated Creatinine Clearance: 7 mL/min (A) (by C-G formula based on SCr of 7.99 mg/dL (H)). Recent Labs  Lab 11/27/21 1828 11/28/21 0441  WBC 12.0* 10.2  LATICACIDVEN 1.3 2.3*    Liver Function Tests: Recent Labs  Lab 11/27/21 1828 11/28/21 0441  AST 21 23  ALT 10 13  ALKPHOS 72 70  BILITOT 0.5 1.1  PROT 6.0* 5.0*  ALBUMIN 3.1* 2.0*   No results for input(s): LIPASE, AMYLASE in the last 168 hours. No results for input(s): AMMONIA in the last 168 hours.  ABG    Component Value Date/Time   PHART 7.214 (L) 06/10/2019 0915   PCO2ART 80.5 (HH) 06/10/2019 0915   PO2ART 62.8 (L) 06/10/2019 0915   HCO3 31.6 (H) 06/10/2019 0915   TCO2 33 (H) 10/19/2019 0822   ACIDBASEDEF 3.0 (H) 03/05/2019 0317   O2SAT 90.8 06/10/2019 0915     Coagulation Profile: No results for input(s): INR, PROTIME in the last 168 hours.  Cardiac Enzymes: No results for input(s): CKTOTAL, CKMB, CKMBINDEX, TROPONINI in the last 168 hours.  HbA1C: Hgb A1c MFr Bld  Date/Time Value Ref Range Status  11/05/2016 03:07 AM 5.2 4.8 - 5.6 % Final    Comment:    (NOTE)         Pre-diabetes: 5.7 - 6.4         Diabetes: >6.4         Glycemic control for adults with diabetes: <7.0     CBG: No results for input(s): GLUCAP in the last 168 hours.  Review of Systems:   10 point review of system taken, please see HPI for positives and negatives. Complaints of ongoing back pain which are more worrisome for him now than his breathing  Past Medical History:  He,  has a past medical history of Alcohol abuse, Colon cancer ([place]), COPD (chronic obstructive pulmonary disease) ([place]), Emphysema of lung ([place]), Enteritis due to Clostridium difficile, ESRD (end stage renal disease) ([place]), Former tobacco use,  Hypertension, Peripheral edema, and Sepsis ([place]) (10/2016).   Surgical History:   Past Surgical History:  Procedure Laterality Date   A/V FISTULAGRAM N/A 10/19/2019   Procedure: A/V FISTULAGRAM- Left Arm;  Surgeon: Angelia Mould, MD;  Location: [facility];  Service: Cardiovascular;  Laterality: N/A;   AV FISTULA PLACEMENT Left 06/14/2019   Procedure: Creation of Left arm arteriovenous fistula;  Surgeon: Rosetta Posner, MD;  Location: [facility];  Service: Vascular;  Laterality: Left;   CARDIOVERSION N/A 03/16/2019   Procedure: CARDIOVERSION;  Surgeon: Pixie Casino, MD;  Location: [facility] ENDOSCOPY;  Service: Cardiovascular;  Laterality: N/A;   CARDIOVERSION N/A 03/23/2019   Procedure: CARDIOVERSION;  Surgeon: Lelon Perla, MD;  Location: [facility] ENDOSCOPY;  Service: Cardiovascular;  Laterality: N/A;   CATARACT EXTRACTION     COLON RESECTION     COLONOSCOPY     IMPLANTATION BONE ANCHORED HEARING AID Left 2016   IR FLUORO  GUIDE CV LINE RIGHT  06/09/2019   IR THORACENTESIS ASP PLEURAL SPACE W/IMG GUIDE  05/21/2019   IR THORACENTESIS ASP PLEURAL SPACE W/IMG GUIDE  10/19/2021   IR US GUIDE VASC ACCESS RIGHT  06/09/2019   Dara Beidleman HEMORRHOIDECTOMY  1995   TEE WITHOUT CARDIOVERSION N/A 03/16/2019   Procedure: TRANSESOPHAGEAL ECHOCARDIOGRAM (TEE);  Surgeon: Pixie Casino, MD;  Location: [facility] ENDOSCOPY;  Service: Cardiovascular;  Laterality: N/A;   TONSILLECTOMY       Social History:   reports that he quit smoking about 59 years ago. His smoking use included cigarettes. He has a 20.00 pack-year smoking history. He has never used smokeless tobacco. He reports current alcohol use of about 7.0 standard drinks per week. He reports that he does not use drugs.   Family History:  His family history includes Brain cancer in his father; Colon cancer in his mother; Lung cancer in his brother.   Allergies Allergies  Allergen Reactions   Amlodipine Swelling   Tizanidine Other (See Comments)    Other  reaction(s): hypotension     Home Medications  Prior to Admission medications   Medication Sig Start Date End Date Taking? Authorizing Provider  acetaminophen (TYLENOL) 325 MG tablet Take 650 mg by mouth every 6 (six) hours as needed for mild pain or moderate pain.   Yes [provider]  albuterol (PROAIR HFA) 108 (90 Base) MCG/ACT inhaler 2 puffs every 4 hours as needed only  if your can't catch your breath Patient taking differently: Inhale 2 puffs into the lungs every 4 (four) hours as needed for wheezing or shortness of breath. 11/09/21  Yes Tanda Rockers, MD  apixaban (ELIQUIS) 2.5 MG TABS tablet TAKE 1 TABLET(2.5 MG) BY MOUTH TWICE DAILY Patient taking differently: Take 2.5 mg by mouth 2 (two) times daily. TAKE 1 TABLET(2.5 MG) BY MOUTH TWICE DAILY 08/17/21  Yes Lorretta Harp, MD  calcitRIOL (ROCALTROL) 0.5 MCG capsule Take 0.5 mcg by mouth daily. 10/29/21  Yes [provider]  docusate sodium (COLACE) 100 MG capsule Take 100 mg by mouth daily.   Yes [provider]  levalbuterol (XOPENEX) 0.63 MG/3ML nebulizer solution Take 3 mLs (0.63 mg total) by nebulization every 6 (six) hours as needed for wheezing or shortness of breath. 11/13/21  Yes Cobb, Karie Schwalbe, NP  multivitamin (RENA-VIT) TABS tablet Take 1 tablet by mouth at bedtime. 06/15/19  Yes Mercy Riding, MD  polyethylene glycol powder (GLYCOLAX/MIRALAX) 17 GM/SCOOP powder Take 17 g by mouth daily.   Yes [provider]  sevelamer carbonate (RENVELA) 800 MG tablet Take 1 tablet (800 mg total) by mouth 3 (three) times daily with meals. 06/15/19  Yes Mercy Riding, MD  TRELEGY ELLIPTA 100-62.5-25 MCG/INH AEPB INHALE 1 PUFF INTO THE LUNGS DAILY 04/27/21  Yes Martyn Ehrich, NP  zolpidem (AMBIEN) 10 MG tablet Take 10 mg by mouth at bedtime as needed for sleep. 12/11/20  Yes [provider]  famotidine (PEPCID) 20 MG tablet One after supper Patient not taking: Reported on 11/27/2021 01/19/21    Tanda Rockers, MD  pantoprazole (PROTONIX) 40 MG tablet Take 1 tablet (40 mg total) by mouth daily. Take 30-60 min before first meal of the day Patient not taking: Reported on 11/27/2021 01/19/21   Tanda Rockers, MD     Critical care time: Ferol Luz Marjarie Irion ACNP Acute Care Nurse Practitioner [place] Please consult Amion 11/28/2021, 3:30 PM

## 2021-11-28 NOTE — Assessment & Plan Note (Signed)
Admit to telemetry bed. Given recurrent nature of pneumonia, will broaden IV abx to zosyn and vanco. Check CT chest to rule out empyema. Bedside thoracic U/S did not show any pleural effusion.

## 2021-11-28 NOTE — H&P (Signed)
History and Physical    David Barton KYH:[id number] DOB: 1933-05-14 DOA: 11/27/2021  PCP: Chesley Noon, MD   Patient coming from: Clinic  I have personally briefly reviewed patient's old medical records in [place]  CC: sent to ER by pulmonary HPI: 87 year old male with a history of end-stage renal disease on peritoneal dialysis, COPD, recurrent pneumonia, A. fib on anticoagulation, presents to the ER from the pulmonary office.  Patient was sent to the ER due to pneumonia seen on chest x-ray.  Patient been treated previously in the last couple weeks for recurrent pneumonia.  Patient's had a productive cough.  He has felt weak.  Unsure if he has been having any fevers.  Patient denies having any falls.  Due to his pneumonia, patient transferred to [facility] for further evaluation.   ED Course: CXR shows pneumonia. No IVF given. Hypotension noted on arrival to [facility].  Review of Systems:  Review of Systems  Constitutional:  Positive for malaise/fatigue.  HENT: Negative.    Eyes: Negative.   Respiratory:  Positive for cough, sputum production and shortness of breath.   Cardiovascular: Negative.   Gastrointestinal: Negative.   Genitourinary: Negative.   Musculoskeletal:  Positive for back pain.  Skin: Negative.   Neurological:  Positive for weakness.  Endo/Heme/Allergies: Negative.   Psychiatric/Behavioral: Negative.    All other systems reviewed and are negative.  Past Medical History:  Diagnosis Date   Alcohol abuse    Colon cancer ([place])    COPD (chronic obstructive pulmonary disease) ([place])    Emphysema of lung ([place])    Enteritis due to Clostridium difficile    ESRD (end stage renal disease) ([place])    Former tobacco use    Hypertension    Peripheral edema    Sepsis ([place]) 10/2016   Aspiration PNA/C diff colitis    Past Surgical History:  Procedure Laterality Date   A/V FISTULAGRAM N/A 10/19/2019   Procedure: A/V FISTULAGRAM- Left Arm;  Surgeon: Angelia Mould, MD;  Location: [facility];  Service: Cardiovascular;  Laterality: N/A;   AV FISTULA PLACEMENT Left 06/14/2019   Procedure: Creation of Left arm arteriovenous fistula;  Surgeon: Rosetta Posner, MD;  Location: [facility];  Service: Vascular;  Laterality: Left;   CARDIOVERSION N/A 03/16/2019   Procedure: CARDIOVERSION;  Surgeon: Pixie Casino, MD;  Location: [facility] ENDOSCOPY;  Service: Cardiovascular;  Laterality: N/A;   CARDIOVERSION N/A 03/23/2019   Procedure: CARDIOVERSION;  Surgeon: Lelon Perla, MD;  Location: [facility];  Service: Cardiovascular;  Laterality: N/A;   CATARACT EXTRACTION     COLON RESECTION     COLONOSCOPY     IMPLANTATION BONE ANCHORED HEARING AID Left 2016   IR FLUORO GUIDE CV LINE RIGHT  06/09/2019   IR THORACENTESIS ASP PLEURAL SPACE W/IMG GUIDE  05/21/2019   IR THORACENTESIS ASP PLEURAL SPACE W/IMG GUIDE  10/19/2021   IR US GUIDE VASC ACCESS RIGHT  06/09/2019   MINOR HEMORRHOIDECTOMY  1995   TEE WITHOUT CARDIOVERSION N/A 03/16/2019   Procedure: TRANSESOPHAGEAL ECHOCARDIOGRAM (TEE);  Surgeon: Pixie Casino, MD;  Location: [facility] ENDOSCOPY;  Service: Cardiovascular;  Laterality: N/A;   TONSILLECTOMY       reports that he quit smoking about 59 years ago. His smoking use included cigarettes. He has a 20.00 pack-year smoking history. He has never used smokeless tobacco. He reports current alcohol use of about 7.0 standard drinks per week. He reports that he does not use drugs.  Allergies  Allergen Reactions   Amlodipine Swelling   Tizanidine Other (See Comments)    Other reaction(s): hypotension    Family History  Problem Relation Age of Onset   Brain cancer Father    Colon cancer Mother        with mets to liver   Lung cancer Brother        was a smoker    Prior to Admission medications   Medication Sig Start Date End Date Taking? Authorizing Provider  acetaminophen (TYLENOL) 325 MG tablet Take 650 mg by mouth every 6 (six) hours as needed.     [provider]  albuterol (PROAIR HFA) 108 (90 Base) MCG/ACT inhaler 2 puffs every 4 hours as needed only  if your can't catch your breath 11/09/21   Tanda Rockers, MD  apixaban (ELIQUIS) 2.5 MG TABS tablet TAKE 1 TABLET(2.5 MG) BY MOUTH TWICE DAILY 08/17/21   Lorretta Harp, MD  calcitRIOL (ROCALTROL) 0.5 MCG capsule Take 0.5 mcg by mouth daily. 10/29/21   [provider]  famotidine (PEPCID) 20 MG tablet One after supper Patient not taking: Reported on 11/27/2021 01/19/21   Tanda Rockers, MD  levalbuterol Penne Lash) 0.63 MG/3ML nebulizer solution Take 3 mLs (0.63 mg total) by nebulization every 6 (six) hours as needed for wheezing or shortness of breath. 11/13/21   Cobb, Karie Schwalbe, NP  multivitamin (RENA-VIT) TABS tablet Take 1 tablet by mouth at bedtime. 06/15/19   Mercy Riding, MD  pantoprazole (PROTONIX) 40 MG tablet Take 1 tablet (40 mg total) by mouth daily. Take 30-60 min before first meal of the day Patient not taking: Reported on 11/27/2021 01/19/21   Tanda Rockers, MD  sevelamer carbonate (RENVELA) 800 MG tablet Take 1 tablet (800 mg total) by mouth 3 (three) times daily with meals. 06/15/19   Mercy Riding, MD  TRELEGY ELLIPTA 100-62.5-25 MCG/INH AEPB INHALE 1 PUFF INTO THE LUNGS DAILY 04/27/21   Martyn Ehrich, NP  zolpidem (AMBIEN) 10 MG tablet Take 10 mg by mouth at bedtime as needed. 12/11/20   [provider]    Physical Exam: Vitals:   11/27/21 2215 11/28/21 0147 11/28/21 0200 11/28/21 0327  BP: 102/65 (!) 91/56 (!) 98/53 (!) 88/62  Pulse: 98 (!) 105  (!) 113  Resp: 18 19  18   Temp:    98.2 F (36.8 C)  TempSrc:    Oral  SpO2: 95% 93%  96%  Weight:    76.6 kg  Height:        Physical Exam Vitals and nursing note reviewed.  Constitutional:      General: He is not in acute distress.    Appearance: He is obese. He is ill-appearing. He is not toxic-appearing or diaphoretic.     Comments: Appears chronically ill.  HENT:     Head:  Normocephalic and atraumatic.  Eyes:     General: No scleral icterus.    Pupils: Pupils are equal, round, and reactive to light.  Cardiovascular:     Rate and Rhythm: Regular rhythm. Tachycardia present.     Pulses: Normal pulses.  Pulmonary:     Breath sounds: Examination of the right-upper field reveals wheezing and rhonchi. Examination of the left-upper field reveals wheezing. Examination of the right-middle field reveals rhonchi. Examination of the right-lower field reveals decreased breath sounds. Decreased breath sounds, wheezing and rhonchi present.     Comments: Bedside thoracic ultrasound demonstrated lung consolidation right greater than left.  No  pleural effusions were noted. Abdominal:     General: Bowel sounds are normal. There is no distension.     Tenderness: There is no abdominal tenderness.     Hernia: No hernia is present.  Musculoskeletal:     Right lower leg: No edema.     Left lower leg: No edema.  Skin:    General: Skin is warm and dry.  Neurological:     General: No focal deficit present.     Mental Status: He is oriented to person, place, and time.     Labs on Admission: I have personally reviewed following labs and imaging studies  CBC: Recent Labs  Lab 11/27/21 1828  WBC 12.0*  NEUTROABS 9.6*  HGB 8.2*  HCT 25.2*  MCV 105.4*  PLT 161   Basic Metabolic Panel: Recent Labs  Lab 11/27/21 1828  NA 135  K 3.1*  CL 93*  CO2 26  GLUCOSE 92  BUN 55*  CREATININE 7.24*  CALCIUM 9.1   GFR: Estimated Creatinine Clearance: 7.7 mL/min (A) (by C-G formula based on SCr of 7.24 mg/dL (H)). Liver Function Tests: Recent Labs  Lab 11/27/21 1828  AST 21  ALT 10  ALKPHOS 72  BILITOT 0.5  PROT 6.0*  ALBUMIN 3.1*   No results for input(s): LIPASE, AMYLASE in the last 168 hours. No results for input(s): AMMONIA in the last 168 hours. Coagulation Profile: No results for input(s): INR, PROTIME in the last 168 hours. Cardiac Enzymes: No results for  input(s): CKTOTAL, CKMB, CKMBINDEX, TROPONINI in the last 168 hours. BNP (last 3 results) Recent Labs    01/19/21 1200  PROBNP 387.0*   HbA1C: No results for input(s): HGBA1C in the last 72 hours. CBG: No results for input(s): GLUCAP in the last 168 hours. Lipid Profile: No results for input(s): CHOL, HDL, LDLCALC, TRIG, CHOLHDL, LDLDIRECT in the last 72 hours. Thyroid Function Tests: No results for input(s): TSH, T4TOTAL, FREET4, T3FREE, THYROIDAB in the last 72 hours. Anemia Panel: No results for input(s): VITAMINB12, FOLATE, FERRITIN, TIBC, IRON, RETICCTPCT in the last 72 hours. Urine analysis:    Component Value Date/Time   COLORURINE YELLOW 06/03/2019 1022   APPEARANCEUR HAZY (A) 06/03/2019 1022   LABSPEC 1.014 06/03/2019 1022   PHURINE 5.0 06/03/2019 1022   GLUCOSEU NEGATIVE 06/03/2019 1022   HGBUR NEGATIVE 06/03/2019 1022   BILIRUBINUR NEGATIVE 06/03/2019 1022   KETONESUR NEGATIVE 06/03/2019 1022   PROTEINUR 30 (A) 06/03/2019 1022   UROBILINOGEN 1.0 05/08/2013 1738   NITRITE NEGATIVE 06/03/2019 1022   LEUKOCYTESUR NEGATIVE 06/03/2019 1022    Radiological Exams on Admission: I have personally reviewed images DG Chest 2 View  Result Date: 11/27/2021 CLINICAL DATA:  Pleural effusion EXAM: CHEST - 2 VIEW COMPARISON:  11/13/2021 FINDINGS: Right lower lobe and to a lesser extent right upper lobe airspace disease concerning for multilobar pneumonia. Small right pleural effusion. Mild left basilar atelectasis. No pneumothorax. Stable cardiomediastinal silhouette. No acute osseous abnormality. IMPRESSION: Right lower lobe and to a lesser extent right upper lobe airspace disease concerning for multilobar pneumonia. Electronically Signed   By: Kathreen Devoid M.D.   On: 11/27/2021 15:53   CT Lumbar Spine Wo Contrast  Result Date: 11/27/2021 CLINICAL DATA:  Initial evaluation for acute low back pain, increased fracture risk. EXAM: CT LUMBAR SPINE WITHOUT CONTRAST TECHNIQUE:  Multidetector CT imaging of the lumbar spine was performed without intravenous contrast administration. Multiplanar CT image reconstructions were also generated. COMPARISON:  None available. FINDINGS: Segmentation: Standard. Lowest well-formed  disc space labeled the L5-S1 level. Alignment: Mild sigmoid scoliosis. Trace stepwise retrolisthesis of L2 on L3 through L4 on L5. Vertebrae: Acute to subacute compression fracture involving the superior endplate of L1 with up to 25% height loss without bony retropulsion. Vertebral body height otherwise maintained. Visualized sacrum and pelvis intact. SI joints symmetric and within normal limits. No discrete or worrisome osseous lesions. Paraspinal and other soft tissues: Mild paraspinous edema adjacent to the L1 fracture. Chronic fatty atrophy noted with throughout the posterior paraspinous and gluteal musculature. Advanced aorto bi-iliac atherosclerotic disease. Right pleural effusion partially visualized. Disc levels: L1-2: Disc desiccation with mild disc bulge. Mild-to-moderate right worse than left facet hypertrophy. No spinal stenosis. Foramina remain patent. L2-3: Trace retrolisthesis with intervertebral disc space narrowing. Diffuse disc bulge without focal disc protrusion. Moderate facet hypertrophy. Mild bilateral lateral recess stenosis. Foramina remain patent. L3-4: Degenerative intervertebral disc space narrowing with retrolisthesis. Diffuse disc bulge with disc desiccation, eccentric to the right. Associated reactive endplate spurring. Moderate bilateral facet hypertrophy. Resultant mild canal with moderate bilateral lateral recess stenosis. Moderate bilateral L3 foraminal stenosis. L4-5: Degenerative intervertebral disc space narrowing with diffuse disc bulge and disc desiccation. Reactive endplate spurring. Moderate facet and ligament flavum hypertrophy. Moderate canal with bilateral subarticular stenosis. Moderate to severe bilateral L4 foraminal stenosis.  L5-S1: Degenerative intervertebral disc space narrowing with diffuse disc bulge and disc desiccation. Reactive endplate spurring. Superimposed central to left subarticular disc protrusion with annular calcification (series 4, image 102). Protruding disc contacts and/or closely approximates both of the descending S1 nerve roots without frank impingement or displacement. Mild facet hypertrophy. No significant spinal stenosis. Moderate bilateral L5 foraminal narrowing. IMPRESSION: 1. Acute to subacute compression fracture involving the superior endplate of L1 with up to 25% height loss without bony retropulsion. 2. Multilevel degenerative spondylosis and facet arthrosis as above. Resultant mild to moderate canal and bilateral subarticular stenosis at L2-3 through L4-5, with moderate to severe bilateral L3 through L5 foraminal narrowing as above. 3. Right pleural effusion, partially visualized. 4. Aortic Atherosclerosis (ICD10-I70.0). Electronically Signed   By: Jeannine Boga M.D.   On: 11/27/2021 19:48    EKG: I have personally reviewed EKG: afib   Assessment/Plan Principal Problem:   Multifocal pneumonia Active Problems:   Hypotension   GI bleed   ESRD (end stage renal disease) ([name])   Closed compression fracture of body of L1 vertebra ([name])   Essential hypertension   Chronic diastolic heart failure ([name])   Longstanding persistent atrial fibrillation ([name])   Chronic respiratory failure with hypoxia and hypercapnia ([place])   DNR (do not resuscitate)   Chronic anticoagulation    Multifocal pneumonia Admit to telemetry bed. Given recurrent nature of pneumonia, will broaden IV abx to zosyn and vanco. Check CT chest to rule out empyema. Bedside thoracic U/S did not show any pleural effusion.  Hypotension Give small 500 cc bolus.  Given his hypoproteinemia and hypoalbuminemia, patient may benefit from colloid infusion.  We will see what happens with giving him crystalloid bolus first  GI  bleed Patient had heme positive stools in the ER.  We will need to hold his Eliquis.  Patient will need to see GI consultation at some point.  Patient has not established with a GI group.  ESRD (end stage renal disease) [facility]) Patient uses peritoneal dialysis.  He has not had any peritoneal dialysis this evening.  We will need indwelling dialysate today.  Please contact nephrology this morning.  Closed compression fracture of body of L1  vertebra [facility]) Patient noted to have an L1 compression fracture.  IV Dilaudid as needed.  Essential hypertension We will hold all his blood pressure medicines due to hypotension.  Chronic diastolic heart failure ([name]) Does not appear volume loaded at this time.  Longstanding persistent atrial fibrillation ([name]) Chronic A. fib.  Will need to hold his Eliquis due to GI bleeding.  Chronic respiratory failure with hypoxia and hypercapnia ([name]) Continue oxygen at 2 L a minute.  DNR (do not resuscitate) Verified DNR.  Patient has a scanned DNR form in the chart.     Chronic anticoagulation Holding Eliquis due to GI bleed.  DVT prophylaxis: SCDs Code Status: DNR/DNI(Do NOT Intubate) verified DNR/DNI with patient Family Communication: no family at bedside  Disposition Plan: return home  Consults called: none  Admission status: Inpatient, Telemetry bed   Kristopher Oppenheim, DO [facility] 11/28/2021, 4:06 AM

## 2021-11-28 NOTE — Plan of Care (Signed)
  Problem: Education: Goal: Knowledge of General Education information will improve Description: Including pain rating scale, medication(s)/side effects and non-pharmacologic comfort measures Outcome: Progressing   Problem: Clinical Measurements: Goal: Ability to maintain clinical measurements within normal limits will improve Outcome: Progressing Goal: Diagnostic test results will improve Outcome: Progressing Goal: Cardiovascular complication will be avoided Outcome: Progressing   

## 2021-11-28 NOTE — Progress Notes (Signed)
Patient refused CPT at this time.

## 2021-11-28 NOTE — Assessment & Plan Note (Signed)
Continue oxygen at 2 L a minute.

## 2021-11-29 DIAGNOSIS — J189 Pneumonia, unspecified organism: Secondary | ICD-10-CM | POA: Diagnosis not present

## 2021-11-29 LAB — BPAM RBC
Blood Product Expiration Date: 202301032359
ISSUE DATE / TIME: 202212101318
Unit Type and Rh: 5100

## 2021-11-29 LAB — RENAL FUNCTION PANEL
Albumin: 1.8 g/dL — ABNORMAL LOW (ref 3.5–5.0)
Anion gap: 14 (ref 5–15)
BUN: 59 mg/dL — ABNORMAL HIGH (ref 8–23)
CO2: 25 mmol/L (ref 22–32)
Calcium: 7.7 mg/dL — ABNORMAL LOW (ref 8.9–10.3)
Chloride: 93 mmol/L — ABNORMAL LOW (ref 98–111)
Creatinine, Ser: 7.67 mg/dL — ABNORMAL HIGH (ref 0.61–1.24)
GFR, Estimated: 6 mL/min — ABNORMAL LOW (ref >60)
Glucose, Bld: 120 mg/dL — ABNORMAL HIGH (ref 70–99)
Phosphorus: 6.5 mg/dL — ABNORMAL HIGH (ref 2.5–4.6)
Potassium: 3.3 mmol/L — ABNORMAL LOW (ref 3.5–5.1)
Sodium: 132 mmol/L — ABNORMAL LOW (ref 135–145)

## 2021-11-29 LAB — CBC
HCT: 26.6 % — ABNORMAL LOW (ref 39.0–52.0)
Hemoglobin: 9 g/dL — ABNORMAL LOW (ref 13.0–17.0)
MCH: 34.6 pg — ABNORMAL HIGH (ref 26.0–34.0)
MCHC: 33.8 g/dL (ref 30.0–36.0)
MCV: 102.3 fL — ABNORMAL HIGH (ref 80.0–100.0)
Platelets: 146 10*3/uL — ABNORMAL LOW (ref 150–400)
RBC: 2.6 MIL/uL — ABNORMAL LOW (ref 4.22–5.81)
RDW: 19.8 % — ABNORMAL HIGH (ref 11.5–15.5)
WBC: 10.8 10*3/uL — ABNORMAL HIGH (ref 4.0–10.5)
nRBC: 0 % (ref 0.0–0.2)

## 2021-11-29 LAB — TYPE AND SCREEN
ABO/RH(D): O POS
Antibody Screen: NEGATIVE
Unit division: 0

## 2021-11-29 LAB — BASIC METABOLIC PANEL
Anion gap: 15 (ref 5–15)
BUN: 58 mg/dL — ABNORMAL HIGH (ref 8–23)
CO2: 24 mmol/L (ref 22–32)
Calcium: 8.1 mg/dL — ABNORMAL LOW (ref 8.9–10.3)
Chloride: 96 mmol/L — ABNORMAL LOW (ref 98–111)
Creatinine, Ser: 8.13 mg/dL — ABNORMAL HIGH (ref 0.61–1.24)
GFR, Estimated: 6 mL/min — ABNORMAL LOW (ref >60)
Glucose, Bld: 131 mg/dL — ABNORMAL HIGH (ref 70–99)
Potassium: 3.7 mmol/L (ref 3.5–5.1)
Sodium: 135 mmol/L (ref 135–145)

## 2021-11-29 LAB — VANCOMYCIN, RANDOM: Vancomycin Rm: 20

## 2021-11-29 LAB — POTASSIUM: Potassium: 4.4 mmol/L (ref 3.5–5.1)

## 2021-11-29 LAB — MAGNESIUM
Magnesium: 1.7 mg/dL (ref 1.7–2.4)
Magnesium: 1.7 mg/dL (ref 1.7–2.4)

## 2021-11-29 MED ORDER — METOPROLOL SUCCINATE ER 25 MG PO TB24
12.5000 mg | ORAL_TABLET | Freq: Every day | ORAL | Status: DC
  Administered 2021-11-29 – 2021-12-01 (×3): 12.5 mg via ORAL
  Filled 2021-11-29 (×4): qty 1

## 2021-11-29 MED ORDER — POLYETHYLENE GLYCOL 3350 17 G PO PACK
17.0000 g | PACK | Freq: Every day | ORAL | Status: DC
Start: 2021-11-29 — End: 2021-12-05
  Administered 2021-11-29 – 2021-12-02 (×3): 17 g via ORAL
  Filled 2021-11-29 (×3): qty 1

## 2021-11-29 MED ORDER — LACTULOSE 10 GM/15ML PO SOLN
20.0000 g | Freq: Two times a day (BID) | ORAL | Status: AC
  Administered 2021-11-29: 20 g via ORAL
  Filled 2021-11-29 (×2): qty 30

## 2021-11-29 MED ORDER — POTASSIUM CHLORIDE CRYS ER 20 MEQ PO TBCR
40.0000 meq | EXTENDED_RELEASE_TABLET | Freq: Two times a day (BID) | ORAL | Status: DC
  Administered 2021-11-29 (×2): 40 meq via ORAL
  Filled 2021-11-29 (×2): qty 2

## 2021-11-29 MED ORDER — VANCOMYCIN HCL 750 MG/150ML IV SOLN
750.0000 mg | Freq: Once | INTRAVENOUS | Status: AC
  Administered 2021-11-30: 750 mg via INTRAVENOUS
  Filled 2021-11-29: qty 150

## 2021-11-29 NOTE — Progress Notes (Addendum)
[facility] Progress Note   Subjective:   Patient seen and examined at bedside.  Reports usual issues with drainage of 1st 3 cycles, but last 2 drained properly.  Denies abdominal pain, n/v/d, CP, SOB and orthopnea.  Admits to constipation, last BM 3 days ago.  Did not take miralax yesterday because they tried to give it during the night and he wants to take in during the day. Continues to have significant back pain, worsened by his bed.   Objective Vitals:   11/28/21 1625 11/28/21 1935 11/28/21 2054 11/29/21 0558  BP: (!) 94/53 (!) 97/51 (!) 100/59 (!) 92/57  Pulse: 92  97 96  Resp: 18 18 18 18   Temp: 97.8 F (36.6 C) 97.8 F (36.6 C) 98.5 F (36.9 C) 98.3 F (36.8 C)  TempSrc: Oral Oral  Oral  SpO2: 96% 95% 95% 95%  Weight:      Height:       Physical Exam General:chronically ill appearing male in NAD Heart:RRR, no mrg Lungs:+rhonchi throughout anterolaterally , nml WOB on 3L via [place] Abdomen:soft, distended, non tender Extremities:no LE edema Dialysis Access: PD catheter RUQ  in use  Filed Weights   11/27/21 1720 11/28/21 0327  Weight: 84.4 kg 76.6 kg    Intake/Output Summary (Last 24 hours) at 11/29/2021 0818 Last data filed at 11/29/2021 0558 Gross per 24 hour  Intake 839 ml  Output 0 ml  Net 839 ml    Additional Objective Labs: Basic Metabolic Panel: Recent Labs  Lab 11/27/21 1828 11/28/21 0441 11/28/21 2041 11/29/21 0631  NA 135 133*  --  132*  K 3.1* 2.9* 3.1* 3.3*  CL 93* 92*  --  93*  CO2 26 26  --  25  GLUCOSE 92 92  --  120*  BUN 55* 57*  --  59*  CREATININE 7.24* 7.99*  --  7.67*  CALCIUM 9.1 8.2*  --  7.7*  PHOS  --   --   --  6.5*   Liver Function Tests: Recent Labs  Lab 11/27/21 1828 11/28/21 0441 11/29/21 0631  AST 21 23  --   ALT 10 13  --   ALKPHOS 72 70  --   BILITOT 0.5 1.1  --   PROT 6.0* 5.0*  --   ALBUMIN 3.1* 2.0* 1.8*    CBC: Recent Labs  Lab 11/27/21 1828 11/28/21 0441 11/29/21 0631  WBC 12.0*  10.2 10.8*  NEUTROABS 9.6* 8.1*  --   HGB 8.2* 7.5* 9.0*  HCT 25.2* 22.5* 26.6*  MCV 105.4* 106.6* 102.3*  PLT 182 165 146*   Blood Culture    Component Value Date/Time   SDES  11/27/2021 1847    BLOOD RIGHT ANTECUBITAL Performed at [facility], [address], [place], [place] [address]    [facility]  11/27/2021 1847    Blood Culture adequate volume BOTTLES DRAWN AEROBIC AND ANAEROBIC Performed at [facility], [address] [place], [place] [address]    CULT  11/27/2021 1847    NO GROWTH < 12 HOURS Performed at [facility], [address], [place], [place] [address]    REPTSTATUS PENDING 11/27/2021 1847     Studies/Results: DG Chest 2 View  Result Date: 11/27/2021 CLINICAL DATA:  Pleural effusion EXAM: CHEST - 2 VIEW COMPARISON:  11/13/2021 FINDINGS: Right lower lobe and to a lesser extent right upper lobe airspace disease concerning for multilobar pneumonia. Small right pleural effusion. Mild left basilar atelectasis. No pneumothorax. Stable cardiomediastinal  silhouette. No acute osseous abnormality. IMPRESSION: Right lower lobe and to a lesser extent right upper lobe airspace disease concerning for multilobar pneumonia. Electronically Signed   By: Kathreen Devoid M.D.   On: 11/27/2021 15:53   CT CHEST WO CONTRAST  Result Date: 11/28/2021 CLINICAL DATA:  Empyema. EXAM: CT CHEST WITHOUT CONTRAST TECHNIQUE: Multidetector CT imaging of the chest was performed following the standard protocol without IV contrast. COMPARISON:  May 20, 2019. FINDINGS: Cardiovascular: Atherosclerosis of thoracic aorta is noted without aneurysm formation. Normal cardiac size. No pericardial effusion. Moderate coronary artery calcifications are noted. Mediastinum/Nodes: Thyroid gland and esophagus are unremarkable. Enlarged mediastinal adenopathy is noted with 18 mm aortopulmonary window lymph node. 14 mm right paratracheal lymph node is noted. 15 mm left  hilar lymph node is noted. Multiple enlarged right supraclavicular lymph nodes are noted as well. This may be inflammatory in etiology, but neoplasm cannot be excluded. Lungs/Pleura: No pneumothorax is noted. Emphysematous disease is noted. Large right upper lobe airspace opacity is noted consistent with pneumonia. Atelectasis of right lower lobe is noted. Moderate size complex and loculated right pleural effusion is noted suspicious for empyema. Minimal left posterior basilar subsegmental atelectasis is noted. Upper Abdomen: No acute abnormality. Musculoskeletal: No chest wall mass or suspicious bone lesions identified. IMPRESSION: Moderate size complex and loculated right pleural effusion is noted suspicious for empyema. Large right upper lobe airspace opacity is noted consistent with pneumonia, with atelectasis of the right lower lobe. Multiple enlarged right supraclavicular right paratracheal left hilar and AP window lymph nodes are noted which may be inflammatory in etiology, but neoplasm cannot be excluded. Follow-up is recommended. Moderate coronary artery calcifications are noted. Aortic Atherosclerosis (ICD10-I70.0) and Emphysema (ICD10-J43.9). Electronically Signed   By: Marijo Conception M.D.   On: 11/28/2021 11:54   CT Lumbar Spine Wo Contrast  Result Date: 11/27/2021 CLINICAL DATA:  Initial evaluation for acute low back pain, increased fracture risk. EXAM: CT LUMBAR SPINE WITHOUT CONTRAST TECHNIQUE: Multidetector CT imaging of the lumbar spine was performed without intravenous contrast administration. Multiplanar CT image reconstructions were also generated. COMPARISON:  None available. FINDINGS: Segmentation: Standard. Lowest well-formed disc space labeled the L5-S1 level. Alignment: Mild sigmoid scoliosis. Trace stepwise retrolisthesis of L2 on L3 through L4 on L5. Vertebrae: Acute to subacute compression fracture involving the superior endplate of L1 with up to 25% height loss without bony  retropulsion. Vertebral body height otherwise maintained. Visualized sacrum and pelvis intact. SI joints symmetric and within normal limits. No discrete or worrisome osseous lesions. Paraspinal and other soft tissues: Mild paraspinous edema adjacent to the L1 fracture. Chronic fatty atrophy noted with throughout the posterior paraspinous and gluteal musculature. Advanced aorto bi-iliac atherosclerotic disease. Right pleural effusion partially visualized. Disc levels: L1-2: Disc desiccation with mild disc bulge. Mild-to-moderate right worse than left facet hypertrophy. No spinal stenosis. Foramina remain patent. L2-3: Trace retrolisthesis with intervertebral disc space narrowing. Diffuse disc bulge without focal disc protrusion. Moderate facet hypertrophy. Mild bilateral lateral recess stenosis. Foramina remain patent. L3-4: Degenerative intervertebral disc space narrowing with retrolisthesis. Diffuse disc bulge with disc desiccation, eccentric to the right. Associated reactive endplate spurring. Moderate bilateral facet hypertrophy. Resultant mild canal with moderate bilateral lateral recess stenosis. Moderate bilateral L3 foraminal stenosis. L4-5: Degenerative intervertebral disc space narrowing with diffuse disc bulge and disc desiccation. Reactive endplate spurring. Moderate facet and ligament flavum hypertrophy. Moderate canal with bilateral subarticular stenosis. Moderate to severe bilateral L4 foraminal stenosis. L5-S1: Degenerative intervertebral disc space narrowing with diffuse  disc bulge and disc desiccation. Reactive endplate spurring. Superimposed central to left subarticular disc protrusion with annular calcification (series 4, image 102). Protruding disc contacts and/or closely approximates both of the descending S1 nerve roots without frank impingement or displacement. Mild facet hypertrophy. No significant spinal stenosis. Moderate bilateral L5 foraminal narrowing. IMPRESSION: 1. Acute to subacute  compression fracture involving the superior endplate of L1 with up to 25% height loss without bony retropulsion. 2. Multilevel degenerative spondylosis and facet arthrosis as above. Resultant mild to moderate canal and bilateral subarticular stenosis at L2-3 through L4-5, with moderate to severe bilateral L3 through L5 foraminal narrowing as above. 3. Right pleural effusion, partially visualized. 4. Aortic Atherosclerosis (ICD10-I70.0). Electronically Signed   By: Jeannine Boga M.D.   On: 11/27/2021 19:48    Medications:  dialysis solution 1.5% low-MG/low-CA     piperacillin-tazobactam (ZOSYN)  IV 2.25 g (11/29/21 0014)    gentamicin cream  1 application Topical Daily   pantoprazole (PROTONIX) IV  40 mg Intravenous Q12H   polyethylene glycol  17 g Oral Daily   potassium chloride  40 mEq Oral BID   sevelamer carbonate  800 mg Oral TID WC   vancomycin variable dose per unstable renal function (pharmacist dosing)   Does not apply See admin instructions    Dialysis Orders: [facility] - 7x week 84.5kg, 6 exchanges, fill vol 3L last fill 1.5L Dwell time 1h 79min   Assessment/Plan: Multifocal PNA - On Vanc and zosyn.   Possible empyema - noted on CT. Hx R pleural effusion requiring thoracentesis.  Plan for bronch w/EBUS and cryo on Monday. Hypokalemia - in setting of poor PO intake. K improved to 3.3.  KCl increased to 72mEq BID with daily labs. Diet changed to carb modified.  Follow labs closely.  L1 compression fracture - pain management per primary  ESRD -  on PD.  Continue nightly PD.  Reported issues with drainage improved with 2.5% bags and possibly related to constipation.  Will plan to use all 1.5% bags due to hypotension.  Continue daily miralax.    Hypertension/volume  - Hypotensive on exam.  May need trial midodrine, will order dose for prior to PD. Does not appear grossly volume overloaded.  UF as tolerated.   Acute on chronic anemia of CKD - +FOBT. Hgb improved to 9.0 s/p 1 unit pRBC  ordered.  No mircera ordered as outpatient.  Iron studies ordered.  Secondary Hyperparathyroidism -  Calcium at goal.  Phos elevated, Continue calcitriol and renvela.   Nutrition - Carb modified diet with fluid restrictions. Alb 2.0 - protein supplements.  Chronic respiratory failure - on 2-2.5L O2 at home,  currently requiring 3L.  Plan for bronch with EBUS and cryo on Monday.  A fib/CHF with EF 30% - Eliquis on hold for procedure. Metoprolol d/c as outpatient due to hypotension    Jen Mow, PA-C [facility] 11/29/2021,8:18 AM  LOS: 1 day   Nephrology attending: I have personally seen and examined patient.  Chart reviewed and I agree with above. ESRD on PD admitted with multifocal pneumonia.  Patient with hypotension, hypokalemia and currently receiving antibiotics.  Unable to ultrafiltrate with PD probably because of constipation.  Patient did not have bowel movement for the last 3 days.  Did not take a stool softener yesterday evening.  I will order lactulose.  Continue PD tonight.  He looks comfortable.  Continue potassium chloride repletion.  Katheran James, MD [facility].

## 2021-11-29 NOTE — Progress Notes (Signed)
PROGRESS NOTE   David Barton  BTD:[id number] DOB: 21-Apr-1933 DOA: 11/27/2021 PCP: Chesley Noon, MD  Brief Narrative:   87 year old white male home dwelling known COPD emphysema respiratory failure 4 L oxygen for the past month-prior Pleural effusions 2020 Permanent A. 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 difficile 2017 OSA Seen in pulmonary office 11/27/2021 after being treated on 11/25 with Augmentin and steroids-he completed those but experienced worsening once again-x-ray in office showed right upper lobe airspace disease?  Multi lobar pneumonia patient was referred to ED On work-up found to have 25% L1 compression fracture Also found to have hemoglobin down from 10-8.2 with guaiac positive stools  Hospital-Problem based course  Outpatient failed Augmentin treatment pneumonia ?  Postobstructive pneumonia  with loculated effusion Continue Zosyn and vancomycin started 12/10 Pulmonology input appreciated-bronchoscopy 12/12 AM as per Dr. Tamala Julian Continue oxygen 4L-wean as tolerated Diarrhea Last BM unformed 2 to 3 days back,  wife indicates started after abxr--not bloody He does not have any further dark stool and has not had a stool since admission  Eliquis 2.5 twice daily from prior to admission held Epistaxis on 11/26 Acute blood loss anemia Profuse epistaxis Thanksgiving evening ~ 8 hours Could be cause for anemia? Transfuse 1 U 12/10-acceptable rise to 9.0 ESRD on peritoneal dialysis hypokalemia Replacing potassium with 40 mEq twice daily Rest as per renal in terms of dialysis peritoneal dialysate Permanent A. fib CHADS2 score >4 previously on Xarelto In sinus tach right now-not on any rate controlling agent, 2/2 normal blood pressures-MAP is about 67 Start metoprolol xl 12.5 and observe Xarelto on hold for now L1 fracture 25% Mobilize with PT/OT No a current  candidate for Kypho Pain is moderately controlled only patient is doing fair on oxycodone ir 5 every 4 as needed moderate pain Tylenol first choice  DVT prophylaxis: scd Code Status: f Family Communication: wife Archie Patten [phone] updated at bedside on 12/10 and 12/11 Disposition:  Status is: Inpatient  Remains inpatient appropriate because: need for work up       Consultants:  pulm  Procedures:   Antimicrobials: vanc zosyn from admit    Subjective:  Doing fair, eating drinking Moderate to severe pain in lower back worsened with straight leg raise also has shoulder pain Complains about timing of the food No cough no cold Chest is relatively clear   Objective: Vitals:   11/28/21 1935 11/28/21 2054 11/29/21 0558 11/29/21 0910  BP: (!) 97/51 (!) 100/59 (!) 92/57 95/61  Pulse:  97 96 94  Resp: 18 18 18 16   Temp: 97.8 F (36.6 C) 98.5 F (36.9 C) 98.3 F (36.8 C) (!) 97.4 F (36.3 C)  TempSrc: Oral  Oral Oral  SpO2: 95% 95% 95% 94%  Weight:      Height:        Intake/Output Summary (Last 24 hours) at 11/29/2021 1129 Last data filed at 11/29/2021 0954 Gross per 24 hour  Intake 17566 ml  Output 15981 ml  Net 1585 ml    Filed Weights   11/27/21 1720 11/28/21 0327  Weight: 84.4 kg 76.6 kg    Examination:  EOMI NCAT looks about stated age Chest clear no added sound rales rhonchi A. fib/sinus tach on monitor rate 100.  Abdomen soft no rebound no guarding No lower extremity edema Psych euthymic Quite hard of hearing  Data Reviewed: personally reviewed   CBC  Component Value Date/Time   WBC 10.8 (H) 11/29/2021 0631   RBC 2.60 (L) 11/29/2021 0631   HGB 9.0 (L) 11/29/2021 0631   HGB 14.1 12/21/2018 1038   HGB 14.9 11/24/2009 0958   HCT 26.6 (L) 11/29/2021 0631   HCT 30.1 (L) 06/09/2019 1329   HCT 43.8 11/24/2009 0958   PLT 146 (L) 11/29/2021 0631   PLT 276 12/21/2018 1038   MCV 102.3 (H) 11/29/2021 0631   MCV 101 (H) 12/21/2018 1038   MCV  108.6 (H) 11/24/2009 0958   MCH 34.6 (H) 11/29/2021 0631   MCHC 33.8 11/29/2021 0631   RDW 19.8 (H) 11/29/2021 0631   RDW 12.5 12/21/2018 1038   RDW 15.4 (H) 11/24/2009 0958   LYMPHSABS 0.8 11/28/2021 0441   LYMPHSABS 2.2 11/24/2009 0958   MONOABS 1.0 11/28/2021 0441   MONOABS 0.6 11/24/2009 0958   EOSABS 0.1 11/28/2021 0441   EOSABS 0.2 11/24/2009 0958   BASOSABS 0.0 11/28/2021 0441   BASOSABS 0.0 11/24/2009 0958   CMP Latest Ref Rng & Units 11/29/2021 11/28/2021 11/28/2021  Glucose 70 - 99 mg/dL 120(H) - 92  BUN 8 - 23 mg/dL 59(H) - 57(H)  Creatinine 0.61 - 1.24 mg/dL 7.67(H) - 7.99(H)  Sodium 135 - 145 mmol/L 132(L) - 133(L)  Potassium 3.5 - 5.1 mmol/L 3.3(L) 3.1(L) 2.9(L)  Chloride 98 - 111 mmol/L 93(L) - 92(L)  CO2 22 - 32 mmol/L 25 - 26  Calcium 8.9 - 10.3 mg/dL 7.7(L) - 8.2(L)  Total Protein 6.5 - 8.1 g/dL - - 5.0(L)  Total Bilirubin 0.3 - 1.2 mg/dL - - 1.1  Alkaline Phos 38 - 126 U/L - - 70  AST 15 - 41 U/L - - 23  ALT 0 - 44 U/L - - 13     Radiology Studies: DG Chest 2 View  Result Date: 11/27/2021 CLINICAL DATA:  Pleural effusion EXAM: CHEST - 2 VIEW COMPARISON:  11/13/2021 FINDINGS: Right lower lobe and to a lesser extent right upper lobe airspace disease concerning for multilobar pneumonia. Small right pleural effusion. Mild left basilar atelectasis. No pneumothorax. Stable cardiomediastinal silhouette. No acute osseous abnormality. IMPRESSION: Right lower lobe and to a lesser extent right upper lobe airspace disease concerning for multilobar pneumonia. Electronically Signed   By: Kathreen Devoid M.D.   On: 11/27/2021 15:53   CT CHEST WO CONTRAST  Result Date: 11/28/2021 CLINICAL DATA:  Empyema. EXAM: CT CHEST WITHOUT CONTRAST TECHNIQUE: Multidetector CT imaging of the chest was performed following the standard protocol without IV contrast. COMPARISON:  May 20, 2019. FINDINGS: Cardiovascular: Atherosclerosis of thoracic aorta is noted without aneurysm formation.  Normal cardiac size. No pericardial effusion. Moderate coronary artery calcifications are noted. Mediastinum/Nodes: Thyroid gland and esophagus are unremarkable. Enlarged mediastinal adenopathy is noted with 18 mm aortopulmonary window lymph node. 14 mm right paratracheal lymph node is noted. 15 mm left hilar lymph node is noted. Multiple enlarged right supraclavicular lymph nodes are noted as well. This may be inflammatory in etiology, but neoplasm cannot be excluded. Lungs/Pleura: No pneumothorax is noted. Emphysematous disease is noted. Large right upper lobe airspace opacity is noted consistent with pneumonia. Atelectasis of right lower lobe is noted. Moderate size complex and loculated right pleural effusion is noted suspicious for empyema. Minimal left posterior basilar subsegmental atelectasis is noted. Upper Abdomen: No acute abnormality. Musculoskeletal: No chest wall mass or suspicious bone lesions identified. IMPRESSION: Moderate size complex and loculated right pleural effusion is noted suspicious for empyema. Large right upper lobe airspace opacity  is noted consistent with pneumonia, with atelectasis of the right lower lobe. Multiple enlarged right supraclavicular right paratracheal left hilar and AP window lymph nodes are noted which may be inflammatory in etiology, but neoplasm cannot be excluded. Follow-up is recommended. Moderate coronary artery calcifications are noted. Aortic Atherosclerosis (ICD10-I70.0) and Emphysema (ICD10-J43.9). Electronically Signed   By: Marijo Conception M.D.   On: 11/28/2021 11:54   CT Lumbar Spine Wo Contrast  Result Date: 11/27/2021 CLINICAL DATA:  Initial evaluation for acute low back pain, increased fracture risk. EXAM: CT LUMBAR SPINE WITHOUT CONTRAST TECHNIQUE: Multidetector CT imaging of the lumbar spine was performed without intravenous contrast administration. Multiplanar CT image reconstructions were also generated. COMPARISON:  None available. FINDINGS:  Segmentation: Standard. Lowest well-formed disc space labeled the L5-S1 level. Alignment: Mild sigmoid scoliosis. Trace stepwise retrolisthesis of L2 on L3 through L4 on L5. Vertebrae: Acute to subacute compression fracture involving the superior endplate of L1 with up to 25% height loss without bony retropulsion. Vertebral body height otherwise maintained. Visualized sacrum and pelvis intact. SI joints symmetric and within normal limits. No discrete or worrisome osseous lesions. Paraspinal and other soft tissues: Mild paraspinous edema adjacent to the L1 fracture. Chronic fatty atrophy noted with throughout the posterior paraspinous and gluteal musculature. Advanced aorto bi-iliac atherosclerotic disease. Right pleural effusion partially visualized. Disc levels: L1-2: Disc desiccation with mild disc bulge. Mild-to-moderate right worse than left facet hypertrophy. No spinal stenosis. Foramina remain patent. L2-3: Trace retrolisthesis with intervertebral disc space narrowing. Diffuse disc bulge without focal disc protrusion. Moderate facet hypertrophy. Mild bilateral lateral recess stenosis. Foramina remain patent. L3-4: Degenerative intervertebral disc space narrowing with retrolisthesis. Diffuse disc bulge with disc desiccation, eccentric to the right. Associated reactive endplate spurring. Moderate bilateral facet hypertrophy. Resultant mild canal with moderate bilateral lateral recess stenosis. Moderate bilateral L3 foraminal stenosis. L4-5: Degenerative intervertebral disc space narrowing with diffuse disc bulge and disc desiccation. Reactive endplate spurring. Moderate facet and ligament flavum hypertrophy. Moderate canal with bilateral subarticular stenosis. Moderate to severe bilateral L4 foraminal stenosis. L5-S1: Degenerative intervertebral disc space narrowing with diffuse disc bulge and disc desiccation. Reactive endplate spurring. Superimposed central to left subarticular disc protrusion with annular  calcification (series 4, image 102). Protruding disc contacts and/or closely approximates both of the descending S1 nerve roots without frank impingement or displacement. Mild facet hypertrophy. No significant spinal stenosis. Moderate bilateral L5 foraminal narrowing. IMPRESSION: 1. Acute to subacute compression fracture involving the superior endplate of L1 with up to 25% height loss without bony retropulsion. 2. Multilevel degenerative spondylosis and facet arthrosis as above. Resultant mild to moderate canal and bilateral subarticular stenosis at L2-3 through L4-5, with moderate to severe bilateral L3 through L5 foraminal narrowing as above. 3. Right pleural effusion, partially visualized. 4. Aortic Atherosclerosis (ICD10-I70.0). Electronically Signed   By: Jeannine Boga M.D.   On: 11/27/2021 19:48     Scheduled Meds:  gentamicin cream  1 application Topical Daily   lactulose  20 g Oral BID   pantoprazole (PROTONIX) IV  40 mg Intravenous Q12H   polyethylene glycol  17 g Oral Daily   potassium chloride  40 mEq Oral BID   sevelamer carbonate  800 mg Oral TID WC   vancomycin variable dose per unstable renal function (pharmacist dosing)   Does not apply See admin instructions   Continuous Infusions:  dialysis solution 1.5% low-MG/low-CA     piperacillin-tazobactam (ZOSYN)  IV 2.25 g (11/29/21 1101)     LOS: 1 day  Time spent: [place], MD [facility] To contact the attending provider between 7A-7P or the covering provider during after hours 7P-7A, please log into the web site [email or web address] and access using universal [place] password for that web site. If you do not have the password, please call the hospital operator.  11/29/2021, 11:29 AM

## 2021-11-29 NOTE — Progress Notes (Signed)
At 1933 telemetry called regarding 12 beat run of Vtach, patient at the moment is asymptomatic being prepared by HD nurses for peritoneal dialysis, vital signs are within normal range, paged Dr. Hal Hope, labs are ordered.

## 2021-11-29 NOTE — Plan of Care (Signed)
  Problem: Education: Goal: Knowledge of General Education information will improve Description: Including pain rating scale, medication(s)/side effects and non-pharmacologic comfort measures Outcome: Not Progressing   Problem: Health Behavior/Discharge Planning: Goal: Ability to manage health-related needs will improve Outcome: Not Progressing   Problem: Clinical Measurements: Goal: Ability to maintain clinical measurements within normal limits will improve Outcome: Not Progressing Goal: Will remain free from infection Outcome: Not Progressing Goal: Diagnostic test results will improve Outcome: Not Progressing Goal: Respiratory complications will improve Outcome: Not Progressing Goal: Cardiovascular complication will be avoided Outcome: Not Progressing   Problem: Activity: Goal: Risk for activity intolerance will decrease Outcome: Not Progressing   Problem: Nutrition: Goal: Adequate nutrition will be maintained Outcome: Not Progressing   Problem: Coping: Goal: Level of anxiety will decrease Outcome: Not Progressing   Problem: Elimination: Goal: Will not experience complications related to bowel motility Outcome: Not Progressing   Problem: Pain Managment: Goal: General experience of comfort will improve Outcome: Not Progressing   Problem: Safety: Goal: Ability to remain free from injury will improve Outcome: Not Progressing   Problem: Skin Integrity: Goal: Risk for impaired skin integrity will decrease Outcome: Not Progressing

## 2021-11-29 NOTE — Evaluation (Signed)
Clinical/Bedside Swallow Evaluation Patient Details  Name: David Barton MRN: [id number] Date of Birth: Jan 24, 1933  Today's Date: 11/29/2021 Time: SLP Start Time (ACUTE ONLY): 12 SLP Stop Time (ACUTE ONLY): 1200 SLP Time Calculation (min) (ACUTE ONLY): 10 min  Past Medical History:  Past Medical History:  Diagnosis Date   Alcohol abuse    Colon cancer ([place])    COPD (chronic obstructive pulmonary disease) ([place])    Emphysema of lung ([place])    Enteritis due to Clostridium difficile    ESRD (end stage renal disease) ([place])    Former tobacco use    Hypertension    Peripheral edema    Sepsis ([place]) 10/2016   Aspiration PNA/C diff colitis   Past Surgical History:  Past Surgical History:  Procedure Laterality Date   A/V FISTULAGRAM N/A 10/19/2019   Procedure: A/V FISTULAGRAM- Left Arm;  Surgeon: Angelia Mould, MD;  Location: [facility];  Service: Cardiovascular;  Laterality: N/A;   AV FISTULA PLACEMENT Left 06/14/2019   Procedure: Creation of Left arm arteriovenous fistula;  Surgeon: Rosetta Posner, MD;  Location: [place];  Service: Vascular;  Laterality: Left;   CARDIOVERSION N/A 03/16/2019   Procedure: CARDIOVERSION;  Surgeon: Pixie Casino, MD;  Location: [facility];  Service: Cardiovascular;  Laterality: N/A;   CARDIOVERSION N/A 03/23/2019   Procedure: CARDIOVERSION;  Surgeon: Lelon Perla, MD;  Location: [facility];  Service: Cardiovascular;  Laterality: N/A;   CATARACT EXTRACTION     COLON RESECTION     COLONOSCOPY     IMPLANTATION BONE ANCHORED HEARING AID Left 2016   IR FLUORO GUIDE CV LINE RIGHT  06/09/2019   IR THORACENTESIS ASP PLEURAL SPACE W/IMG GUIDE  05/21/2019   IR THORACENTESIS ASP PLEURAL SPACE W/IMG GUIDE  10/19/2021   IR US GUIDE VASC ACCESS RIGHT  06/09/2019   MINOR HEMORRHOIDECTOMY  1995   TEE WITHOUT CARDIOVERSION N/A 03/16/2019   Procedure: TRANSESOPHAGEAL ECHOCARDIOGRAM (TEE);  Surgeon: Pixie Casino, MD;  Location: [facility] ENDOSCOPY;   Service: Cardiovascular;  Laterality: N/A;   TONSILLECTOMY     HPI:  87 year old male former smoker patient of Dr. Christinia Gully who has ongoing COPD, emphysema O2 dependent at 3 to 4 L nasal cannula.  Constant chronic cough he has been worked up for dysphagia in the past swallowing evaluation without problem.  His chief complaint was back pain.  He does have an L2 compression fracture.  CT evidence reveals left-sided pneumonia, plan for fiberoptic bronchoscopy 11/30/2021.  There is some question of malignancy this will also be addressed at that time. Pt seen by ENT on 08/06/21 with finding of Left canal cholesteatoma- wife and patient just want serial cleanings no surgery, though recommended.  Left asymmetrical sensorineural hearing loss. Last MBS 05/21/2019 - WNL.    Assessment / Plan / Recommendation  Clinical Impression  Pt demonstrates no subjective signs of aspiration, consistent with prior MBS in 2020. He denies difficulty and reports he eats a regular diet. Questioned him a great deal about potential for post prandial aspriation as well with out any red flags noted. Pt to have bronchoscopy tomorrow and will be NPO after midnight. Will follow chart for any further concerning finding that may warrant MBS, but it nothing found will sign off as pt appears WNL. SLP Visit Diagnosis: Dysphagia, unspecified (R13.10)    Aspiration Risk  Mild aspiration risk    Diet Recommendation Regular;Thin liquid   Liquid Administration via: Straw;Cup Medication Administration: Whole meds with liquid Postural  Changes: Seated upright at 90 degrees    Other  Recommendations      Recommendations for follow up therapy are one component of a multi-disciplinary discharge planning process, led by the attending physician.  Recommendations may be updated based on patient status, additional functional criteria and insurance authorization.  Follow up Recommendations No SLP follow up      Assistance Recommended at  Discharge    Functional Status Assessment    Frequency and Duration            Prognosis        Swallow Study   General HPI: 87 year old male former smoker patient of Dr. Christinia Gully who has ongoing COPD, emphysema O2 dependent at 3 to 4 L nasal cannula.  Constant chronic cough he has been worked up for dysphagia in the past swallowing evaluation without problem.  His chief complaint was back pain.  He does have an L2 compression fracture.  CT evidence reveals left-sided pneumonia, plan for fiberoptic bronchoscopy 11/30/2021.  There is some question of malignancy this will also be addressed at that time. Pt seen by ENT on 08/06/21 with finding of Left canal cholesteatoma- wife and patient just want serial cleanings no surgery, though recommended.  Left asymmetrical sensorineural hearing loss. Last MBS 05/21/2019 - WNL. Type of Study: Bedside Swallow Evaluation Diet Prior to this Study: Regular;Thin liquids Temperature Spikes Noted: No Respiratory Status: Room air History of Recent Intubation: No Behavior/Cognition: Alert;Cooperative;Pleasant mood Oral Cavity Assessment: Within Functional Limits Oral Care Completed by SLP: No Oral Cavity - Dentition: Adequate natural dentition (has a partial, but not needed for mastication) Vision: Functional for self-feeding Patient Positioning: Upright in bed Baseline Vocal Quality: Normal Volitional Cough: Strong Volitional Swallow: Able to elicit    Oral/Motor/Sensory Function Overall Oral Motor/Sensory Function: Within functional limits   Ice Chips     Thin Liquid Thin Liquid: Within functional limits    Nectar Thick Nectar Thick Liquid: Not tested   Honey Thick Honey Thick Liquid: Not tested   Puree Puree: Not tested   Solid     Solid: Not tested      Lynann Beaver 11/29/2021,1:06 PM

## 2021-11-29 NOTE — Progress Notes (Signed)
Pharmacy Antibiotic Note  MIGUELANGEL KORN is a 87 y.o. male admitted on 11/27/2021 with pneumonia; pt had been on ABX as outpt without improvement, new CXR shows persistent lower-lobe PNA and new upper-lobe PNA.  Pharmacy has been consulted for vancomycin and Zosyn dosing.  Pt on PD for ESRD.  Bcx:  12/9: ngtd   Antimicrobials this admission:  12/9: Azithromycin 500 mg IV x1 12/9: Ceftriaxone 2g IV x1 12/10: Vanc 1500mg  IV  x1 12/10: Zosyn 2.25 g IV Q8h          12/11: Vanc level: 20 mcg/mL   Plan: Vanc 750 mg IV x 1 12/12 @1000  after PD Continue Zosyn 2.25g IV Q8H.  Height: 5\' 10"  (177.8 cm) Weight: 76.6 kg (168 lb 14 oz) IBW/kg (Calculated) : 73  Temp (24hrs), Avg:97.8 F (36.6 C), Min:97.4 F (36.3 C), Max:98.5 F (36.9 C)  Recent Labs  Lab 11/27/21 1828 11/28/21 0441 11/29/21 0631  WBC 12.0* 10.2 10.8*  CREATININE 7.24* 7.99* 7.67*  LATICACIDVEN 1.3 2.3*  --   VANCORANDOM  --   --  20     Estimated Creatinine Clearance: 7.3 mL/min (A) (by C-G formula based on SCr of 7.67 mg/dL (H)).    Allergies  Allergen Reactions   Amlodipine Swelling   Tizanidine Other (See Comments)    Other reaction(s): hypotension    Thank you for allowing pharmacy to be a part of this patient's care.  Marzella Schlein Wilson, Stage manager

## 2021-11-29 NOTE — Consult Note (Addendum)
This is a progress note    NAME:  David Barton, MRN:  [id number], DOB:  [birth year], LOS: 1 ADMISSION DATE:  11/27/2021, CONSULTATION DATE: 09/30/2021 REFERRING MD: [facility], CHIEF COMPLAINT: Back pain  History of Present Illness:  87 year old male former smoker patient of Dr. Christinia Gully who has ongoing COPD, emphysema O2 dependent at 3 to 4 L nasal cannula.  Constant chronic cough he has been worked up for dysphagia in the past swallowing evaluation without problem.  He is on peritoneal dialysis.  Reports that his weight is constant at approximately 87 kg.  Denies fevers chills sweats or productive cough.  He did have a thoracentesis done in October 2022.  His chief complaint was back pain.  He does have an L2 compression fracture.  CT evidence reveals left-sided pneumonia he is being evaluated for fiberoptic bronchoscopy on 09/30/2021 for visualization and clearing of suspected mucoid impaction.  There is some question of malignancy this will also be addressed at that time.  Pertinent  Medical History   Past Medical History:  Diagnosis Date   Alcohol abuse    Colon cancer ([place])    COPD (chronic obstructive pulmonary disease) ([place])    Emphysema of lung ([place])    Enteritis due to Clostridium difficile    ESRD (end stage renal disease) ([place])    Former tobacco use    Hypertension    Peripheral edema    Sepsis ([place]) 10/2016   Aspiration PNA/C diff colitis     Significant Hospital Events: Including procedures, antibiotic start and stop dates in addition to other pertinent events   11/28/2021 pulmonary consult plan for fiberoptic bronchoscopy 11/30/2021  Interim History / Subjective:  No events, back pain still an issue when pain meds wear off. Ongoing nagging cough. Wife at bedside.  Objective   Blood pressure 95/61, pulse 94, temperature (!) 97.4 F (36.3 C), temperature source Oral, resp. rate 16, height 5\' 10"  (1.778 m), weight 76.6 kg, SpO2 94 %.        Intake/Output Summary  (Last 24 hours) at 11/29/2021 1100 Last data filed at 11/29/2021 0954 Gross per 24 hour  Intake 17566 ml  Output 15981 ml  Net 1585 ml    Filed Weights   11/27/21 1720 11/28/21 0327  Weight: 84.4 kg 76.6 kg    Examination: No distress Lungs diminished on R, rattling upper airway sounds Ext warm Moves all 4 ext to command Good insight PD catheter CDI  Resolved Hospital Problem list     Assessment & Plan:  Acute on chronic hypoxic respiratory failure multifocal.  Gold B COPD, emphysema, persistent pleural effusion, status post thoracentesis in September 28, 2021.  Multiple episodes of pneumonia recently treated with Augmentin.  Presented to the pulmonary office on 11/27/2021 increasing back pain, increasing shortness of breath, chronic cough and generalized failure to thrive.  Worrisome for malignancy vs. Mucus impaction Discussed risks (remaining intubated, ICU admission for pressors) and benefits (potential improved cough/oxygen levels, get diagnosis) with patient, he and wife are agreeable to proceed.  NPO midnight, will do EBUS/bronch with potential cryobiopsy in AM.  Will send for culture data as well. Wife would like call after procedure. - abx as ordered  End-stage renal disease Continue peritoneal dialysis  Compression fractures at L1 Pain relief as needed Further outpatient evaluation  History of atrial fibrillation AC on hold  Erskine Emery MD PCCM

## 2021-11-29 NOTE — Evaluation (Signed)
Occupational Therapy Evaluation Patient Details Name: David Barton MRN: [id number] DOB: [birth year] Today's Date: 11/29/2021   History of Present Illness 87 year old male  presents to the ER 11/28/21 from the pulmonary office.  Patient was sent to the ER due to pneumonia seen on chest x-ray. hypotensive; compression fx L1;   PMH-- end-stage renal disease on peritoneal dialysis, COPD, recurrent pneumonia, A. fib on anticoagulation, colon Ca, HTN   Clinical Impression   Pt admitted for concerns listed above. PTA Pt reported that he was independent until a week or so ago. Pt limited by pain this session, requiring max encouragement. Due to pain and weakness, pt requiring min-max A for all ADL's at this time. Educated on spinal precautions, however unsure whether pt will follow through with them or not. Recommending SNF to maximize pt independence. OT will follow acutely to address concerns listed below.       Recommendations for follow up therapy are one component of a multi-disciplinary discharge planning process, led by the attending physician.  Recommendations may be updated based on patient status, additional functional criteria and insurance authorization.   Follow Up Recommendations  Skilled nursing-short term rehab (<3 hours/day)    Assistance Recommended at Discharge Frequent or constant Supervision/Assistance  Functional Status Assessment  Patient has had a recent decline in their functional status and demonstrates the ability to make significant improvements in function in a reasonable and predictable amount of time.  Equipment Recommendations  Other (comment) (TBD)    Recommendations for Other Services       Precautions / Restrictions Precautions Precautions: Fall Precaution Comments: Reviewed back precautions and log roll technique Restrictions Weight Bearing Restrictions: No      Mobility Bed Mobility Overal bed mobility: Needs Assistance Bed Mobility: Supine to  Sit;Sit to Supine     Supine to sit: Min assist;HOB elevated Sit to supine: Supervision   General bed mobility comments: Pt attempted to roll, reporting 10/10 pain, said he would sit up on his own.    Transfers                   General transfer comment: Pt deferred due to pain      Balance Overall balance assessment: Needs assistance Sitting-balance support: No upper extremity supported;Feet supported Sitting balance-Leahy Scale: Fair                                     ADL either performed or assessed with clinical judgement   ADL Overall ADL's : Needs assistance/impaired Eating/Feeding: Set up;Sitting   Grooming: Set up;Sitting   Upper Body Bathing: Moderate assistance;Sitting   Lower Body Bathing: Maximal assistance;Sitting/lateral leans;Sit to/from stand   Upper Body Dressing : Minimal assistance;Sitting   Lower Body Dressing: Maximal assistance;Sitting/lateral leans;Sit to/from stand       Toileting- Water quality scientist and Hygiene: Maximal assistance;Sitting/lateral lean;Sit to/from stand         General ADL Comments: All assessed at sitting level due to pt deferring to stand     Vision Baseline Vision/History: 0 No visual deficits Ability to See in Adequate Light: 0 Adequate Patient Visual Report: No change from baseline Vision Assessment?: No apparent visual deficits     Perception     Praxis      Pertinent Vitals/Pain Pain Assessment: Faces Faces Pain Scale: Hurts even more Pain Location: Back Pain Descriptors / Indicators: Aching;Discomfort;Grimacing Pain Intervention(s): Limited activity within patient's tolerance;Monitored  during session;Repositioned     Hand Dominance Right   Extremity/Trunk Assessment Upper Extremity Assessment Upper Extremity Assessment: Generalized weakness   Lower Extremity Assessment Lower Extremity Assessment: Defer to PT evaluation   Cervical / Trunk Assessment Cervical / Trunk  Assessment: Kyphotic;Other exceptions Cervical / Trunk Exceptions: recent significant incr in his back pain with L1 comp fx found incidently   Communication Communication Communication: HOH   Cognition Arousal/Alertness: Awake/alert Behavior During Therapy: WFL for tasks assessed/performed Overall Cognitive Status: Within Functional Limits for tasks assessed                                       General Comments  On 2L througout, satting at 91%    Exercises     Shoulder Instructions      Home Living Family/patient expects to be discharged to:: Private residence Living Arrangements: Spouse/significant other Available Help at Discharge: Family;Available PRN/intermittently (wife works) Type of Home: House Home Access: Stairs to enter Technical brewer of Steps: 4 Entrance Stairs-Rails: Right Home Layout: One level     Bathroom Shower/Tub: [employer]: Conservation officer, nature (2 wheels);Cane - single point;Shower seat - built in;Grab bars - tub/shower;Hand held shower head          Prior Functioning/Environment Prior Level of Function : Needs assist       Physical Assist : Mobility (physical);ADLs (physical) Mobility (physical): Gait ADLs (physical): Bathing;Dressing Mobility Comments: recent incr back pain caused him to try RW at home but did not think it helped and walked without a device; used clinic w/c when going to see his doctor recently due to back pain and couldn't walk that far ADLs Comments: only showers when wife is home; she dries his back; he wears no socks and slippers recently due to back pain        OT Problem List: Decreased strength;Decreased activity tolerance;Decreased range of motion;Impaired balance (sitting and/or standing);Decreased safety awareness;Decreased knowledge of use of DME or AE;Pain      OT Treatment/Interventions: Self-care/ADL training;Therapeutic exercise;Energy conservation;DME and/or AE  instruction;Therapeutic activities;Patient/family education;Balance training    OT Goals(Current goals can be found in the care plan section) Acute Rehab OT Goals Patient Stated Goal: To reduce pain OT Goal Formulation: With patient Time For Goal Achievement: 12/13/21 Potential to Achieve Goals: Fair ADL Goals Pt Will Perform Grooming: with modified independence;sitting Pt Will Perform Lower Body Bathing: with modified independence;sitting/lateral leans;sit to/from stand Pt Will Perform Lower Body Dressing: with modified independence;sitting/lateral leans;sit to/from stand Pt Will Transfer to Toilet: with modified independence;ambulating Pt Will Perform Toileting - Clothing Manipulation and hygiene: with modified independence;sitting/lateral leans;sit to/from stand Additional ADL Goal #1: Pt recall 3/3 spinal precautions  OT Frequency: Min 2X/week   Barriers to D/C:            Co-evaluation              AM-PAC OT "6 Clicks" Daily Activity     Outcome Measure Help from another person eating meals?: A Little Help from another person taking care of personal grooming?: A Little Help from another person toileting, which includes using toliet, bedpan, or urinal?: A Lot Help from another person bathing (including washing, rinsing, drying)?: A Lot Help from another person to put on and taking off regular upper body clothing?: A Little Help from another person to put on and taking  off regular lower body clothing?: A Lot 6 Click Score: 15   End of Session Equipment Utilized During Treatment: Oxygen Nurse Communication: Mobility status  Activity Tolerance: Patient tolerated treatment well Patient left: in bed;with call bell/phone within reach;with bed alarm set  OT Visit Diagnosis: Unsteadiness on feet (R26.81);Other abnormalities of gait and mobility (R26.89);Muscle weakness (generalized) (M62.81)                Time: 8257-4935 OT Time Calculation (min): 17 min Charges:  OT  General Charges $OT Visit: 1 Visit OT Evaluation $OT Eval Moderate Complexity: 1 Mod  Payslee Bateson H., OTR/L Acute Rehabilitation  Marva Hendryx Elane Yolanda Bonine 11/29/2021, 7:57 PM

## 2021-11-29 NOTE — Evaluation (Signed)
Physical Therapy Evaluation Patient Details Name: David Barton MRN: [id number] DOB: 12-20-1933 Today's Date: 11/29/2021  History of Present Illness  87 year old male  presents to the ER 11/28/21 from the pulmonary office.  Patient was sent to the ER due to pneumonia seen on chest x-ray. hypotensive; compression fx L1;   PMH-- end-stage renal disease on peritoneal dialysis, COPD, recurrent pneumonia, A. fib on anticoagulation, colon Ca, HTN  Clinical Impression   Pt admitted secondary to problem above with deficits below. PTA patient was living with wife who works. He had recent increase in back pain (pt uncertain what precipitated pain--denies falling). He began needing wife's assist with peritoneal dialysate bags because they were too heavy for him to lift. She was supervising his showers. Pt tried using a RW he has but did not find it helpful with his back pain. He was still walking household distances independently, however he was using the clinic's wheelchair when he would go to see his doctor. Pt currently requires min assist for bed mobility and transfers. His oxygen saturation dropped to 79% in standing (using 2L O2) and unable to attempt ambulation.  Anticipate patient will benefit from PT to address problems listed below.Will continue to follow acutely to maximize functional mobility independence and safety.    Anticipate pt could go home instead of SNF if can arrange 24/7 caregivers that can provide min assist.         Recommendations for follow up therapy are one component of a multi-disciplinary discharge planning process, led by the attending physician.  Recommendations may be updated based on patient status, additional functional criteria and insurance authorization.  Follow Up Recommendations Skilled nursing-short term rehab (<3 hours/day)    Assistance Recommended at Discharge Frequent or constant Supervision/Assistance  Functional Status Assessment Patient has had a recent  decline in their functional status and demonstrates the ability to make significant improvements in function in a reasonable and predictable amount of time.  Equipment Recommendations  None recommended by PT    Recommendations for Other Services OT consult     Precautions / Restrictions Precautions Precautions: Fall Precaution Comments: could use back precautions for pain minimization, however he wouldn't      Mobility  Bed Mobility Overal bed mobility: Needs Assistance Bed Mobility: Supine to Sit;Sit to Supine     Supine to sit: Min assist;HOB elevated Sit to supine: Supervision   General bed mobility comments: pt refused to roll on his side for side to sit stating it hurt his back too much    Transfers Overall transfer level: Needs assistance Equipment used: Rolling walker (2 wheels) Transfers: Sit to/from Stand Sit to Stand: Min assist           General transfer comment: stood for ~30 seconds wiht return to sit due to fatigue and decr sats 79%    Ambulation/Gait               General Gait Details: deferred due to low sats  Stairs            Wheelchair Mobility    Modified Rankin (Stroke Patients Only)       Balance Overall balance assessment: Needs assistance Sitting-balance support: No upper extremity supported;Feet supported Sitting balance-Leahy Scale: Fair     Standing balance support: Bilateral upper extremity supported;Reliant on assistive device for balance Standing balance-Leahy Scale: Poor  Pertinent Vitals/Pain Pain Assessment: Faces Faces Pain Scale: Hurts even more    Home Living Family/patient expects to be discharged to:: Private residence Living Arrangements: Spouse/significant other Available Help at Discharge: Family;Available PRN/intermittently (wife works) Type of Home: House Home Access: Stairs to enter Entrance Stairs-Rails: Right Entrance Stairs-Number of Steps: 4    Home Layout: One level Home Equipment: Conservation officer, nature (2 wheels);Cane - single point;Shower seat - built in;Grab bars - tub/shower;Hand held shower head      Prior Function Prior Level of Function : Needs assist       Physical Assist : Mobility (physical);ADLs (physical) Mobility (physical): Gait ADLs (physical): Bathing;Dressing Mobility Comments: recent incr back pain caused him to try RW at home but did not think it helped and walked without a device; used clinic w/c when going to see his doctor recently due to back pain and couldn't walk that far ADLs Comments: only showers when wife is home; she dries his back; he wears no socks and slippers recently due to back pain     Hand Dominance        Extremity/Trunk Assessment   Upper Extremity Assessment Upper Extremity Assessment: Defer to OT evaluation    Lower Extremity Assessment Lower Extremity Assessment: Generalized weakness    Cervical / Trunk Assessment Cervical / Trunk Assessment: Kyphotic;Other exceptions Cervical / Trunk Exceptions: recent significant incr in his back pain with L1 comp fx found incidently  Communication   Communication: HOH  Cognition Arousal/Alertness: Awake/alert Behavior During Therapy: WFL for tasks assessed/performed Overall Cognitive Status: Within Functional Limits for tasks assessed                                          General Comments General comments (skin integrity, edema, etc.): on 2L throughout session; sat monitor reading 79% and pt returned to sitting where sats slowly climbed to 88% (~3 minutes)    Exercises     Assessment/Plan    PT Assessment Patient needs continued PT services  PT Problem List Decreased strength;Decreased activity tolerance;Decreased balance;Decreased mobility;Decreased knowledge of use of DME;Decreased knowledge of precautions;Cardiopulmonary status limiting activity;Pain       PT Treatment Interventions DME instruction;Gait  training;Stair training;Functional mobility training;Therapeutic activities;Therapeutic exercise;Balance training;Patient/family education    PT Goals (Current goals can be found in the Care Plan section)  Acute Rehab PT Goals Patient Stated Goal: back to feel better PT Goal Formulation: With patient Time For Goal Achievement: 12/13/21 Potential to Achieve Goals: Good    Frequency Min 3X/week   Barriers to discharge Decreased caregiver support wife works    Co-evaluation               AM-PAC PT "6 Clicks" Mobility  Outcome Measure Help needed turning from your back to your side while in a flat bed without using bedrails?: A Little Help needed moving from lying on your back to sitting on the side of a flat bed without using bedrails?: A Little Help needed moving to and from a bed to a chair (including a wheelchair)?: A Little Help needed standing up from a chair using your arms (e.g., wheelchair or bedside chair)?: A Little Help needed to walk in hospital room?: Total Help needed climbing 3-5 steps with a railing? : Total 6 Click Score: 14    End of Session Equipment Utilized During Treatment: Oxygen Activity Tolerance: Treatment limited secondary to medical complications (  Comment) (decr sats; back pain) Patient left: in bed;with call bell/phone within reach;with bed alarm set Nurse Communication: Mobility status;Other (comment) (drop in sats) PT Visit Diagnosis: Difficulty in walking, not elsewhere classified (R26.2);Pain Pain - Right/Left:  (midline) Pain - part of body:  (back)    Time: 2694-8546 PT Time Calculation (min) (ACUTE ONLY): 31 min   Charges:   PT Evaluation $PT Eval Moderate Complexity: 1 Mod PT Treatments $Therapeutic Activity: 8-22 mins         Arby Barrette, PT Acute Rehabilitation Services  Pager [phone] Office [phone]   Rexanne Mano 11/29/2021, 2:00 PM

## 2021-11-29 NOTE — Progress Notes (Signed)
PT Cancellation Note  Patient Details Name: David Barton MRN: [id number] DOB: 1933-11-09   Cancelled Treatment:    Reason Eval/Treat Not Completed: Patient at procedure or test/unavailable  Will return later today to reattempt.   Arby Barrette, PT Acute Rehabilitation Services  Pager [phone] Office [phone]   Rexanne Mano 11/29/2021, 10:45 AM

## 2021-11-30 ENCOUNTER — Encounter (HOSPITAL_COMMUNITY)
Admission: EM | Disposition: A | Discharge status: Home Health | Payer: Self-pay | Source: Home / Self Care | Attending: Family Medicine

## 2021-11-30 ENCOUNTER — Inpatient Hospital Stay (HOSPITAL_COMMUNITY): Payer: Medicare Other | Admitting: Anesthesiology

## 2021-11-30 HISTORY — PX: HEMOSTASIS CONTROL: SHX6838

## 2021-11-30 HISTORY — PX: BRONCHIAL NEEDLE ASPIRATION BIOPSY: SHX5106

## 2021-11-30 HISTORY — PX: VIDEO BRONCHOSCOPY WITH ENDOBRONCHIAL ULTRASOUND: SHX6177

## 2021-11-30 LAB — RENAL FUNCTION PANEL
Albumin: 1.8 g/dL — ABNORMAL LOW (ref 3.5–5.0)
Anion gap: 13 (ref 5–15)
BUN: 54 mg/dL — ABNORMAL HIGH (ref 8–23)
CO2: 26 mmol/L (ref 22–32)
Calcium: 7.9 mg/dL — ABNORMAL LOW (ref 8.9–10.3)
Chloride: 95 mmol/L — ABNORMAL LOW (ref 98–111)
Creatinine, Ser: 7.89 mg/dL — ABNORMAL HIGH (ref 0.61–1.24)
GFR, Estimated: 6 mL/min — ABNORMAL LOW (ref >60)
Glucose, Bld: 108 mg/dL — ABNORMAL HIGH (ref 70–99)
Phosphorus: 6.2 mg/dL — ABNORMAL HIGH (ref 2.5–4.6)
Potassium: 3.8 mmol/L (ref 3.5–5.1)
Sodium: 134 mmol/L — ABNORMAL LOW (ref 135–145)

## 2021-11-30 LAB — CBC
HCT: 28.2 % — ABNORMAL LOW (ref 39.0–52.0)
Hemoglobin: 9.3 g/dL — ABNORMAL LOW (ref 13.0–17.0)
MCH: 33.9 pg (ref 26.0–34.0)
MCHC: 33 g/dL (ref 30.0–36.0)
MCV: 102.9 fL — ABNORMAL HIGH (ref 80.0–100.0)
Platelets: 143 10*3/uL — ABNORMAL LOW (ref 150–400)
RBC: 2.74 MIL/uL — ABNORMAL LOW (ref 4.22–5.81)
RDW: 19.7 % — ABNORMAL HIGH (ref 11.5–15.5)
WBC: 10.2 10*3/uL (ref 4.0–10.5)
nRBC: 0 % (ref 0.0–0.2)

## 2021-11-30 LAB — MAGNESIUM: Magnesium: 1.7 mg/dL (ref 1.7–2.4)

## 2021-11-30 LAB — IRON AND TIBC
Iron: 74 ug/dL (ref 45–182)
Saturation Ratios: 53 % — ABNORMAL HIGH (ref 17.9–39.5)
TIBC: 139 ug/dL — ABNORMAL LOW (ref 250–450)
UIBC: 65 ug/dL

## 2021-11-30 LAB — FERRITIN: Ferritin: 1269 ng/mL — ABNORMAL HIGH (ref 24–336)

## 2021-11-30 SURGERY — BRONCHOSCOPY, WITH EBUS
Anesthesia: General | Laterality: Right

## 2021-11-30 MED ORDER — SODIUM CHLORIDE 0.9 % IV SOLN: INTRAVENOUS | Status: DC

## 2021-11-30 MED ORDER — MIDODRINE HCL 5 MG PO TABS
20.0000 mg | ORAL_TABLET | Freq: Three times a day (TID) | ORAL | Status: DC
  Administered 2021-11-30 – 2021-12-04 (×13): 20 mg via ORAL
  Filled 2021-11-30 (×15): qty 4

## 2021-11-30 MED ORDER — LIDOCAINE 2% (20 MG/ML) 5 ML SYRINGE
INTRAMUSCULAR | Status: DC | PRN
  Administered 2021-11-30: 60 mg via INTRAVENOUS

## 2021-11-30 MED ORDER — FENTANYL CITRATE (PF) 100 MCG/2ML IJ SOLN: 25.0000 ug | INTRAMUSCULAR | Status: DC | PRN

## 2021-11-30 MED ORDER — CHLORHEXIDINE GLUCONATE 0.12 % MT SOLN
OROMUCOSAL | Status: AC
  Administered 2021-11-30: 15 mL
  Filled 2021-11-30: qty 15

## 2021-11-30 MED ORDER — ALBUMIN HUMAN 5 % IV SOLN
INTRAVENOUS | Status: AC
  Filled 2021-11-30: qty 250

## 2021-11-30 MED ORDER — SODIUM CHLORIDE (PF) 0.9 % IJ SOLN
PREFILLED_SYRINGE | INTRAMUSCULAR | Status: DC | PRN
  Administered 2021-11-30: 4 mL

## 2021-11-30 MED ORDER — SUCCINYLCHOLINE CHLORIDE 200 MG/10ML IV SOSY
PREFILLED_SYRINGE | INTRAVENOUS | Status: DC | PRN
  Administered 2021-11-30: 120 mg via INTRAVENOUS

## 2021-11-30 MED ORDER — ONDANSETRON HCL 4 MG/2ML IJ SOLN: 4.0000 mg | Freq: Once | INTRAMUSCULAR | Status: DC | PRN

## 2021-11-30 MED ORDER — EPHEDRINE SULFATE-NACL 50-0.9 MG/10ML-% IV SOSY
PREFILLED_SYRINGE | INTRAVENOUS | Status: DC | PRN
  Administered 2021-11-30: 10 mg via INTRAVENOUS

## 2021-11-30 MED ORDER — PHENYLEPHRINE 40 MCG/ML (10ML) SYRINGE FOR IV PUSH (FOR BLOOD PRESSURE SUPPORT)
PREFILLED_SYRINGE | INTRAVENOUS | Status: DC | PRN
  Administered 2021-11-30 (×2): 200 ug via INTRAVENOUS

## 2021-11-30 MED ORDER — DEXAMETHASONE SODIUM PHOSPHATE 10 MG/ML IJ SOLN
INTRAMUSCULAR | Status: DC | PRN
  Administered 2021-11-30: 10 mg via INTRAVENOUS

## 2021-11-30 MED ORDER — SUGAMMADEX SODIUM 200 MG/2ML IV SOLN
INTRAVENOUS | Status: DC | PRN
  Administered 2021-11-30: 200 mg via INTRAVENOUS

## 2021-11-30 MED ORDER — PROPOFOL 10 MG/ML IV BOLUS
INTRAVENOUS | Status: DC | PRN
  Administered 2021-11-30: 100 mg via INTRAVENOUS

## 2021-11-30 MED ORDER — ROCURONIUM BROMIDE 10 MG/ML (PF) SYRINGE
PREFILLED_SYRINGE | INTRAVENOUS | Status: DC | PRN
  Administered 2021-11-30: 30 mg via INTRAVENOUS
  Administered 2021-11-30: 10 mg via INTRAVENOUS

## 2021-11-30 MED ORDER — FENTANYL CITRATE (PF) 250 MCG/5ML IJ SOLN
INTRAMUSCULAR | Status: DC | PRN
  Administered 2021-11-30: 50 ug via INTRAVENOUS

## 2021-11-30 MED ORDER — ALBUMIN HUMAN 5 % IV SOLN
12.5000 g | Freq: Once | INTRAVENOUS | Status: AC
  Administered 2021-11-30: 12.5 g via INTRAVENOUS

## 2021-11-30 MED ORDER — PHENYLEPHRINE HCL-NACL 20-0.9 MG/250ML-% IV SOLN
INTRAVENOUS | Status: DC | PRN
  Administered 2021-11-30: 65 ug/min via INTRAVENOUS

## 2021-11-30 MED ORDER — MIDODRINE HCL 5 MG PO TABS: 20.0000 mg | ORAL_TABLET | Freq: Three times a day (TID) | ORAL | Status: DC

## 2021-11-30 SURGICAL SUPPLY — 35 items
ADAPTER VALVE BIOPSY EBUS (MISCELLANEOUS) IMPLANT
ADPTR VALVE BIOPSY EBUS (MISCELLANEOUS)
BRUSH CYTOL CELLEBRITY 1.5X140 (MISCELLANEOUS) IMPLANT
CANISTER SUCT 3000ML PPV (MISCELLANEOUS) ×4 IMPLANT
CONT SPEC 4OZ CLIKSEAL STRL BL (MISCELLANEOUS) ×4 IMPLANT
COVER BACK TABLE 60X90IN (DRAPES) ×4 IMPLANT
FORCEPS BIOP RJ4 1.8 (CUTTING FORCEPS) IMPLANT
GAUZE SPONGE 4X4 12PLY STRL (GAUZE/BANDAGES/DRESSINGS) ×4 IMPLANT
GLOVE BIO SURGEON STRL SZ7.5 (GLOVE) ×4 IMPLANT
GOWN STRL REUS W/ TWL LRG LVL3 (GOWN DISPOSABLE) ×3 IMPLANT
GOWN STRL REUS W/ TWL XL LVL3 (GOWN DISPOSABLE) ×3 IMPLANT
GOWN STRL REUS W/TWL LRG LVL3 (GOWN DISPOSABLE) ×3
GOWN STRL REUS W/TWL XL LVL3 (GOWN DISPOSABLE) ×3
KIT CLEAN ENDO COMPLIANCE (KITS) ×8 IMPLANT
KIT TURNOVER KIT B (KITS) ×4 IMPLANT
MARKER SKIN DUAL TIP RULER LAB (MISCELLANEOUS) ×4 IMPLANT
NDL ASPIRATION VIZISHOT 19G (NEEDLE) IMPLANT
NDL ASPIRATION VIZISHOT 21G (NEEDLE) IMPLANT
NEEDLE ASPIRATION VIZISHOT 19G (NEEDLE) IMPLANT
NEEDLE ASPIRATION VIZISHOT 21G (NEEDLE) IMPLANT
NS IRRIG 1000ML POUR BTL (IV SOLUTION) ×4 IMPLANT
OIL SILICONE PENTAX (PARTS (SERVICE/REPAIRS)) ×4 IMPLANT
PAD ARMBOARD 7.5X6 YLW CONV (MISCELLANEOUS) ×8 IMPLANT
SYR 20ML ECCENTRIC (SYRINGE) ×8 IMPLANT
SYR 20ML LL LF (SYRINGE) ×8 IMPLANT
SYR 50ML SLIP (SYRINGE) IMPLANT
SYR 5ML LUER SLIP (SYRINGE) ×4 IMPLANT
TOWEL GREEN STERILE FF (TOWEL DISPOSABLE) ×4 IMPLANT
TRAP SPECIMEN MUCOUS 40CC (MISCELLANEOUS) IMPLANT
TUBE CONNECTING 20X1/4 (TUBING) ×8 IMPLANT
UNDERPAD 30X30 (UNDERPADS AND DIAPERS) ×4 IMPLANT
VALVE BIOPSY  SINGLE USE (MISCELLANEOUS) ×3
VALVE BIOPSY SINGLE USE (MISCELLANEOUS) ×3 IMPLANT
VALVE SUCTION BRONCHIO DISP (MISCELLANEOUS) ×4 IMPLANT
WATER STERILE IRR 1000ML POUR (IV SOLUTION) ×4 IMPLANT

## 2021-11-30 NOTE — Anesthesia Preprocedure Evaluation (Addendum)
Anesthesia Evaluation  Patient identified by MRN, date of birth, ID band Patient awake    Reviewed: Allergy & Precautions, NPO status , Patient's Chart, lab work & pertinent test results  History of Anesthesia Complications (+) history of anesthetic complications (hx diff airway in past- no issues 2021 miller 3 grade 1 view )  Airway Mallampati: III  TM Distance: >3 FB Neck ROM: Full    Dental  (+) Dental Advisory Given, Missing, Teeth Intact   Pulmonary shortness of breath and with exertion, COPD,  COPD inhaler, former smoker,  Endobronchial obstruction   Quit smoking 1963, 20 pack year history  mucousy cough   Pulmonary exam normal breath sounds clear to auscultation       Cardiovascular hypertension (hypotensive at baseline 90s/50s), +CHF (LVEF 40-45%)  Normal cardiovascular exam+ dysrhythmias (eliquis) Atrial Fibrillation + Valvular Problems/Murmurs (mild AS) AS  Rhythm:Regular Rate:Normal  Echo  1. Left ventricular ejection fraction, by estimation, is 45 to 50%. The  left ventricle has mildly decreased function. The left ventricle  demonstrates global hypokinesis. Left ventricular diastolic function could  not be evaluated.  2. Right ventricular systolic function is normal. The right ventricular  size is normal.  3. The mitral valve is normal in structure. No evidence of mitral valve  regurgitation. No evidence of mitral stenosis.  4. The aortic valve is calcified. There is mild calcification of the  aortic valve. There is mild thickening of the aortic valve. Aortic valve  regurgitation is not visualized. Mild aortic valve stenosis.    Neuro/Psych negative neurological ROS     GI/Hepatic Neg liver ROS, GERD  Medicated and Controlled,Hx CRC   Endo/Other  negative endocrine ROS  Renal/GU ESRFRenal diseaseOn PD- last PD this morning  negative genitourinary   Musculoskeletal negative musculoskeletal ROS (+)    Abdominal   Peds  Hematology  (+) Blood dyscrasia, anemia , Hb 9.3, plt 143   Anesthesia Other Findings 12 beat run of Vtach overnight   Reproductive/Obstetrics negative OB ROS                           Anesthesia Physical Anesthesia Plan  ASA: 4  Anesthesia Plan: General   Post-op Pain Management:    Induction: Intravenous and Rapid sequence  PONV Risk Score and Plan: 2 and Ondansetron, Dexamethasone and Treatment may vary due to age or medical condition  Airway Management Planned: Oral ETT and Video Laryngoscope Planned  Additional Equipment: None  Intra-op Plan:   Post-operative Plan: Extubation in OR  Informed Consent: I have reviewed the patients History and Physical, chart, labs and discussed the procedure including the risks, benefits and alternatives for the proposed anesthesia with the patient or authorized representative who has indicated his/her understanding and acceptance.   Patient has DNR.   Dental advisory given  Plan Discussed with: CRNA  Anesthesia Plan Comments: (Per pt was a difficult airway In the ED a few years ago but he was awake for intubation?)      Anesthesia Quick Evaluation

## 2021-11-30 NOTE — Progress Notes (Signed)
Subjective: States feels better /tolerating PD /noted for bronchoscopy today  Objective Vital signs in last 24 hours: Vitals:   11/30/21 0929 11/30/21 1000 11/30/21 1151 11/30/21 1203  BP: 103/66 103/66 (!) 81/41 (!) 92/53  Pulse: 97 97 97   Resp: 18 18 20    Temp: 98.2 F (36.8 C) 98.5 F (36.9 C) 97.6 F (36.4 C)   TempSrc:   Oral   SpO2: 90% 91% 95%   Weight:   85.3 kg   Height:   5\' 10"  (1.778 m)    Weight change:   Physical Exam: General: Alert but chronically ill male in bed NAD Heart: RRR no MRG Lungs: Faint bibasilar crackles nonlabored breathing Abdomen: NABS, NT has PD fluid in minimal distention secondary to this. Extremities: Trace bipedal edema Dialysis Access: PD cath right upper quadrant  Dialysis Orders:  [facility] - 7x week 84.5kg, 6 exchanges, fill vol 3L last fill 1.5L Dwell time 1h 41min  Problem/Plan: Multifocal pneumonia with loculated effusion/outpatient failed Augmentin RX PNA = pulmonary evaluating with bronchoscopy today, on Zosyn vancomycin, ESRD -on CCPD Hypokalemia= on admit 3.1/2.9= improved on p.o. 40 mEq K supplement this a.m. 3.8, will hold p.o. supplement  Anemia -Hgb 9.3/iron sat 53%, noted transfuse 1 unit 12/10, had epistasis on 11/26, hold ESA until bronc results back Secondary hyperparathyroidism -Renvela as binder, Phos 6.2 corrected calcium okay continue calcitriol HTN/volume -noted hypotensive on exam, no excess volume on exam, UF as tolerated, noted start midodrine 5 mg 3 PD(on 11/28/21 start) A. fib/CHF with EF 30%= metoprolol stopped as outpatient secondary to low BP Eliquis on hold for procedure Chronic respiratory failure= on home O2 2 to 2.5 L, pulmonology plans for block with EBUS today and cryo History of L1 fracture 25%= plans per admit not a current candidate for kypho pain mod. controlled  RX per admit oxycodone/mobilize with PT/OT  Ernest Haber, PA-C [facility] Beeper [phone] 11/30/2021,12:46 PM  LOS: 2  days   Labs: Basic Metabolic Panel: Recent Labs  Lab 11/29/21 0631 11/29/21 1628 11/29/21 2109 11/30/21 0346  NA 132*  --  135 134*  K 3.3* 4.4 3.7 3.8  CL 93*  --  96* 95*  CO2 25  --  24 26  GLUCOSE 120*  --  131* 108*  BUN 59*  --  58* 54*  CREATININE 7.67*  --  8.13* 7.89*  CALCIUM 7.7*  --  8.1* 7.9*  PHOS 6.5*  --   --  6.2*   Liver Function Tests: Recent Labs  Lab 11/27/21 1828 11/28/21 0441 11/29/21 0631 11/30/21 0346  AST 21 23  --   --   ALT 10 13  --   --   ALKPHOS 72 70  --   --   BILITOT 0.5 1.1  --   --   PROT 6.0* 5.0*  --   --   ALBUMIN 3.1* 2.0* 1.8* 1.8*   No results for input(s): LIPASE, AMYLASE in the last 168 hours. No results for input(s): AMMONIA in the last 168 hours. CBC: Recent Labs  Lab 11/27/21 1828 11/28/21 0441 11/29/21 0631 11/30/21 0346  WBC 12.0* 10.2 10.8* 10.2  NEUTROABS 9.6* 8.1*  --   --   HGB 8.2* 7.5* 9.0* 9.3*  HCT 25.2* 22.5* 26.6* 28.2*  MCV 105.4* 106.6* 102.3* 102.9*  PLT 182 165 146* 143*   Cardiac Enzymes: No results for input(s): CKTOTAL, CKMB, CKMBINDEX, TROPONINI in the last 168 hours. CBG: No results for input(s): GLUCAP in the  last 168 hours.  Studies/Results: No results found. Medications:  sodium chloride 10 mL/hr at 11/30/21 1208   [facility] Hold] dialysis solution 1.5% low-MG/low-CA     [MAR Hold] piperacillin-tazobactam (ZOSYN)  IV 2.25 g (11/30/21 0949)    [MAR Hold] gentamicin cream  1 application Topical Daily   lactulose  20 g Oral BID   [MAR Hold] metoprolol succinate  12.5 mg Oral Daily   [MAR Hold] pantoprazole (PROTONIX) IV  40 mg Intravenous Q12H   [MAR Hold] polyethylene glycol  17 g Oral Daily   [MAR Hold] sevelamer carbonate  800 mg Oral TID WC   [MAR Hold] vancomycin variable dose per unstable renal function (pharmacist dosing)   Does not apply See admin instructions

## 2021-11-30 NOTE — Transfer of Care (Signed)
Immediate Anesthesia Transfer of Care Note  Patient: David Barton  Procedure(s) Performed: VIDEO BRONCHOSCOPY WITH ENDOBRONCHIAL ULTRASOUND (Right) BRONCHIAL NEEDLE ASPIRATION BIOPSIES HEMOSTASIS CONTROL  Patient Location: PACU  Anesthesia Type:General  Level of Consciousness: awake, alert  and oriented  Airway & Oxygen Therapy: Patient connected to face mask oxygen  Post-op Assessment: Post -op Vital signs reviewed and stable  Post vital signs: stable  Last Vitals:  Vitals Value Taken Time  BP 94/46 11/30/21 1441  Temp    Pulse 114 11/30/21 1443  Resp 21 11/30/21 1443  SpO2 92 % 11/30/21 1443  Vitals shown include unvalidated device data.  Last Pain:  Vitals:   11/30/21 1203  TempSrc:   PainSc: 0-No pain      Patients Stated Pain Goal: 2 (41/28/20 8138)  Complications: No notable events documented.

## 2021-11-30 NOTE — Progress Notes (Signed)
PT Cancellation Note  Patient Details Name: David Barton MRN: [id number] DOB: [birth year]   Cancelled Treatment:    Reason Eval/Treat Not Completed: Patient at procedure or test/unavailable (bronchoscopy)  David Barton, PT, [place] Pager [phone] Office [phone]    David Barton 11/30/2021, 11:53 AM

## 2021-11-30 NOTE — Progress Notes (Signed)
Pt receives PD care at [facility]. Spoke to pt's PD RN, Jenny Reichmann, to provide update. If pt requires snf at d/c, pt will have to be changed to HD since snf do not allow PD. Will assist as needed.  Melven Sartorius Renal Navigator [phone]

## 2021-11-30 NOTE — Anesthesia Procedure Notes (Signed)
Procedure Name: Intubation Date/Time: 11/30/2021 1:05 PM Performed by: Lavell Luster, CRNA Pre-anesthesia Checklist: Patient identified, Emergency Drugs available, Suction available, Patient being monitored and Timeout performed Patient Re-evaluated:Patient Re-evaluated prior to induction Oxygen Delivery Method: Circle system utilized Preoxygenation: Pre-oxygenation with 100% oxygen Induction Type: IV induction Ventilation: Mask ventilation without difficulty Laryngoscope Size: Mac, 4 and Glidescope Grade View: Grade I Tube type: Oral Tube size: 8.5 mm Number of attempts: 1 Airway Equipment and Method: Rigid stylet Placement Confirmation: ETT inserted through vocal cords under direct vision, positive ETCO2 and breath sounds checked- equal and bilateral Secured at: 24 cm Tube secured with: Tape Dental Injury: Teeth and Oropharynx as per pre-operative assessment

## 2021-11-30 NOTE — Consult Note (Signed)
This is a progress note    NAME:  David Barton, MRN:  [id number], DOB:  September 17, 1933, LOS: 2 ADMISSION DATE:  11/27/2021, CONSULTATION DATE: 09/30/2021 REFERRING MD: [facility], CHIEF COMPLAINT: Back pain  History of Present Illness:  87 year old male former smoker patient of Dr. Christinia Gully who has ongoing COPD, emphysema O2 dependent at 3 to 4 L nasal cannula.  Constant chronic cough he has been worked up for dysphagia in the past swallowing evaluation without problem.  He is on peritoneal dialysis.  Reports that his weight is constant at approximately 87 kg.  Denies fevers chills sweats or productive cough.  He did have a thoracentesis done in October 2022.  His chief complaint was back pain.  He does have an L2 compression fracture.  CT evidence reveals left-sided pneumonia he is being evaluated for fiberoptic bronchoscopy on 09/30/2021 for visualization and clearing of suspected mucoid impaction.  There is some question of malignancy this will also be addressed at that time.  Pertinent  Medical History   Past Medical History:  Diagnosis Date   Alcohol abuse    Colon cancer ([place])    COPD (chronic obstructive pulmonary disease) ([place])    Emphysema of lung ([place])    Enteritis due to Clostridium difficile    ESRD (end stage renal disease) ([place])    Former tobacco use    Hypertension    Peripheral edema    Sepsis ([place]) 10/2016   Aspiration PNA/C diff colitis     Significant Hospital Events: Including procedures, antibiotic start and stop dates in addition to other pertinent events   11/28/2021 pulmonary consult plan for fiberoptic bronchoscopy 11/30/2021  Interim History / Subjective:  No events, back pain still an issue when pain meds wear off. Ongoing nagging cough. Wife at bedside.  Objective   Blood pressure (!) 92/53, pulse 97, temperature 97.6 F (36.4 C), temperature source Oral, resp. rate 20, height 5\' 10"  (1.778 m), weight 85.3 kg, SpO2 95 %.        Intake/Output Summary  (Last 24 hours) at 11/30/2021 1253 Last data filed at 11/30/2021 1000 Gross per 24 hour  Intake 18163 ml  Output 19236 ml  Net -1073 ml   Filed Weights   11/28/21 0327 11/29/21 1946 11/30/21 1151  Weight: 76.6 kg 80 kg 85.3 kg    Examination: General: no acute distress HENT: No JVD or lymphadenopathy appreciated Lungs: diminished breath sounds on right Cardiovascular: Heart sounds are distant Abdomen: Soft nontender peritoneal catheter in place Extremities: Mild edema Neuro: Grossly intact without focal defect  Resolved Hospital Problem list     Assessment & Plan:  Acute on chronic hypoxic respiratory failure multifocal.  Gold B COPD, emphysema, persistent pleural effusion, status post thoracentesis in September 28, 2021.  Multiple episodes of pneumonia recently treated with Augmentin.  Presented to the pulmonary office on 11/27/2021 increasing back pain, increasing shortness of breath, chronic cough and generalized failure to thrive.  Worrisome for malignancy vs. Mucus impaction - scheduled for bronchoscopy today for inspection with possible EBUS and endobronchial biopsies - abx as ordered  End-stage renal disease Continue peritoneal dialysis  Compression fractures at L1 Pain relief as needed Further outpatient evaluation  History of atrial fibrillation AC on hold  Freda Jackson, MD [place] Office: [phone]   See Amion for personal pager PCCM on call pager [phone] until 7pm. Please call Elink 7p-7a. [phone]

## 2021-11-30 NOTE — Progress Notes (Signed)
   11/30/21 1908  Assess: MEWS Score  Temp 97.6 F (36.4 C)  BP (!) 94/50  Pulse Rate 91  ECG Heart Rate 86  Resp (!) 22  Level of Consciousness Alert  SpO2 (!) 88 %  O2 Device Nasal Cannula  O2 Flow Rate (L/min) 4 L/min  Assess: MEWS Score  MEWS Temp 0  MEWS Systolic 1  MEWS Pulse 0  MEWS RR 1  MEWS LOC 0  MEWS Score 2  MEWS Score Color Yellow  Assess: if the MEWS score is Yellow or Red  Were vital signs taken at a resting state? Yes  Focused Assessment No change from prior assessment  Early Detection of Sepsis Score *See Row Information* High  MEWS guidelines implemented *See Row Information* Yes  Notify: Charge Nurse/RN  Name of Charge Nurse/RN Notified Erma Pinto  Date Charge Nurse/RN Notified 11/30/21  Time Charge Nurse/RN Notified 1920

## 2021-11-30 NOTE — Plan of Care (Signed)
  Problem: Education: Goal: Knowledge of General Education information will improve Description: Including pain rating scale, medication(s)/side effects and non-pharmacologic comfort measures Outcome: Progressing   Problem: Clinical Measurements: Goal: Diagnostic test results will improve Outcome: Progressing   Problem: Activity: Goal: Risk for activity intolerance will decrease Outcome: Progressing   Problem: Coping: Goal: Level of anxiety will decrease Outcome: Progressing   Problem: Pain Managment: Goal: General experience of comfort will improve Outcome: Progressing

## 2021-11-30 NOTE — Addendum Note (Signed)
Addendum  created 11/30/21 1553 by Pervis Hocking, DO   Clinical Note Signed

## 2021-11-30 NOTE — Op Note (Signed)
Flexible and EBUS Bronchoscopy Procedure Note  ARVEL OQUINN  [id number]  [birth year]  Date:11/30/21  Time:3:30 PM   Provider Performing:Eiley Mcginnity B Lariah Fleer   Procedure: Flexible bronchoscopy and EBUS Bronchoscopy  Indication(s) Right lower lobe obstruction  Consent Risks of the procedure as well as the alternatives and risks of each were explained to the patient and/or caregiver.  Consent for the procedure was obtained.  Anesthesia General Anesthesia   Time Out Verified patient identification, verified procedure, site/side was marked, verified correct patient position, special equipment/implants available, medications/allergies/relevant history reviewed, required imaging and test results available.   Sterile Technique Usual hand hygiene, masks, gowns, and gloves were used   Procedure Description Diagnostic bronchoscope advanced through endotracheal tube and into airway.  Airways were examined down to subsegmental level with findings noted below.  Following diagnostic evaluation, the diagnostic bronchoscope was then removed and the EBUS bronchoscope was advanced into airway with stations 4R and 7 biopsied and sent for slide, cell block, and/or culture.  The EBUS bronchoscope was removed after assuring no active bleeding from biopsy site.  Findings:  - Right bronchial tree appeared edematous and friable. Narrowing of the right upper lobe entrance. Narrowing of right middle lobe entrance as well as the lateral segment entrance of right lower lobe.  - Mediastinal adenopathy - Left bronchial tree appeared fairly normal.     Complications/Tolerance None; patient tolerated the procedure well. Chest X-ray is not needed post procedure.   EBL Minimal   Specimen(s) Station 4R lymph node Station 7 lymph node

## 2021-11-30 NOTE — Progress Notes (Signed)
PROGRESS NOTE   David Barton  JKK:[id number] DOB: 1933-02-15 DOA: 11/27/2021 PCP: Chesley Noon, MD  Brief Narrative:   87 year old white male home dwelling known COPD emphysema respiratory failure 4 L oxygen for the past month-prior Pleural effusions 2020 Permanent A. 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 difficile 2017 OSA Seen in pulmonary office 11/27/2021 after being treated on 11/25 with Augmentin and steroids-he completed those but experienced worsening once again-x-ray in office showed right upper lobe airspace disease?  Multi lobar pneumonia patient was referred to ED On work-up found to have 25% L1 compression fracture Also found to have hemoglobin down from 10-8.2 with guaiac positive stools  Hospital-Problem based course  Outpatient failed Augmentin treatment pneumonia ?  Postobstructive pneumonia  with loculated effusion Continue Zosyn and vancomycin started 12/10 Pulmonology input appreciated-bronchoscopy 12/12 AM as per Dr. Tamala Julian Continue oxygen 4L-wean as tolerated Diarrhea Last BM unformed 2 to 3 days back,  wife indicates started after abxr--not bloody Eliquis 2.5 twice daily from prior to admission held Epistaxis on 11/26 Acute blood loss anemia Profuse epistaxis Thanksgiving evening ~ 8 hours Could be cause for anemia? Transfuse 1 U 12/10-hemoglobin remains 9.0 ESRD on peritoneal dialysis hypokalemia Replaced and now resolved Rest as per renal in terms of dialysis peritoneal dialysate Permanent A. fib CHADS2 score >4 previously on Xarelto In sinus tach right now-not on any rate controlling agent, 2/2 normal blood pressures-MAP is about 67 Start metoprolol xl 12.5 and was better controlled Xarelto on hold for now L1 fracture 25% Mobilize with PT/OT Not a current candidate for Kypho Pain is moderately controlled only patient is doing fair on oxycodone  ir 5 every 4 as needed moderate pain Tylenol first choice  DVT prophylaxis: scd Code Status: f Family Communication: wife Archie Patten [phone] updated at bedside on 12/10 and 12/11 Disposition:  Status is: Inpatient  Remains inpatient appropriate because: need for work up       Consultants:  pulm  Procedures:   Antimicrobials: vanc zosyn from admit    Subjective:  NPO no new issues No bleeding no cp fever  Objective: Vitals:   11/30/21 0929 11/30/21 1000 11/30/21 1151 11/30/21 1203  BP: 103/66 103/66 (!) 81/41 (!) 92/53  Pulse: 97 97 97   Resp: 18 18 20    Temp: 98.2 F (36.8 C) 98.5 F (36.9 C) 97.6 F (36.4 C)   TempSrc:   Oral   SpO2: 90% 91% 95%   Weight:   85.3 kg   Height:   5\' 10"  (1.778 m)     Intake/Output Summary (Last 24 hours) at 11/30/2021 1342 Last data filed at 11/30/2021 1000 Gross per 24 hour  Intake 18163 ml  Output 18536 ml  Net -373 ml    Filed Weights   11/28/21 0327 11/29/21 1946 11/30/21 1151  Weight: 76.6 kg 80 kg 85.3 kg    Examination:  EOMI NCAT no pallor Cta b no added sound S1 s 2no m/r/g Abd soft nt nd no rebound  Psych euthymic but very HOH  Data Reviewed: personally reviewed   CBC    Component Value Date/Time   WBC 10.2 11/30/2021 0346   RBC 2.74 (L) 11/30/2021 0346   HGB 9.3 (L) 11/30/2021 0346   HGB 14.1 12/21/2018 1038   HGB 14.9 11/24/2009 0958   HCT 28.2 (L) 11/30/2021 0346   HCT 30.1 (L) 06/09/2019 1329  HCT 43.8 11/24/2009 0958   PLT 143 (L) 11/30/2021 0346   PLT 276 12/21/2018 1038   MCV 102.9 (H) 11/30/2021 0346   MCV 101 (H) 12/21/2018 1038   MCV 108.6 (H) 11/24/2009 0958   MCH 33.9 11/30/2021 0346   MCHC 33.0 11/30/2021 0346   RDW 19.7 (H) 11/30/2021 0346   RDW 12.5 12/21/2018 1038   RDW 15.4 (H) 11/24/2009 0958   LYMPHSABS 0.8 11/28/2021 0441   LYMPHSABS 2.2 11/24/2009 0958   MONOABS 1.0 11/28/2021 0441   MONOABS 0.6 11/24/2009 0958   EOSABS 0.1 11/28/2021 0441   EOSABS 0.2  11/24/2009 0958   BASOSABS 0.0 11/28/2021 0441   BASOSABS 0.0 11/24/2009 0958   CMP Latest Ref Rng & Units 11/30/2021 11/29/2021 11/29/2021  Glucose 70 - 99 mg/dL 108(H) 131(H) -  BUN 8 - 23 mg/dL 54(H) 58(H) -  Creatinine 0.61 - 1.24 mg/dL 7.89(H) 8.13(H) -  Sodium 135 - 145 mmol/L 134(L) 135 -  Potassium 3.5 - 5.1 mmol/L 3.8 3.7 4.4  Chloride 98 - 111 mmol/L 95(L) 96(L) -  CO2 22 - 32 mmol/L 26 24 -  Calcium 8.9 - 10.3 mg/dL 7.9(L) 8.1(L) -  Total Protein 6.5 - 8.1 g/dL - - -  Total Bilirubin 0.3 - 1.2 mg/dL - - -  Alkaline Phos 38 - 126 U/L - - -  AST 15 - 41 U/L - - -  ALT 0 - 44 U/L - - -     Radiology Studies: No results found.   Scheduled Meds:  [MAR Hold] gentamicin cream  1 application Topical Daily   lactulose  20 g Oral BID   [MAR Hold] metoprolol succinate  12.5 mg Oral Daily   [MAR Hold] pantoprazole (PROTONIX) IV  40 mg Intravenous Q12H   [MAR Hold] polyethylene glycol  17 g Oral Daily   [MAR Hold] sevelamer carbonate  800 mg Oral TID WC   [MAR Hold] vancomycin variable dose per unstable renal function (pharmacist dosing)   Does not apply See admin instructions   Continuous Infusions:  sodium chloride 10 mL/hr at 11/30/21 1309   [MAR Hold] dialysis solution 1.5% low-MG/low-CA     [MAR Hold] piperacillin-tazobactam (ZOSYN)  IV 2.25 g (11/30/21 0949)     LOS: 2 days   Time spent: 19  Nita Sells, MD [facility] To contact the attending provider between 7A-7P or the covering provider during after hours 7P-7A, please log into the web site [email or web address] and access using universal [place] password for that web site. If you do not have the password, please call the hospital operator.  11/30/2021, 1:42 PM

## 2021-11-30 NOTE — Progress Notes (Signed)
Called by anesthesia nursing staff regarding patient unable to sustain blood pressures-MAP in the 59s Was placed on Neo-Synephrine received 1 L bolus IV fluid and albumin x1 He is mentating fine He has chronic hypotension and is on midodrine and had received probably fentanyl perioperatively which could account for low blood pressure  I reassessed him twice and think that he can go to stepdown I have made Dr. Erin Fulling aware of him decompensating slightly after procedure-they will keep on their list in case something were to happen I expect that he will recover with increasing midodrine to 20 mg 3 times daily for now  CRITICAL CARE Performed by: Nita Sells   Total critical care time: 25 minutes minutes  Critical care time was exclusive of separately billable procedures and treating other patients.  Critical care was necessary to treat or prevent imminent or life-threatening deterioration.  Critical care was time spent personally by me on the following activities: development of treatment plan with patient and/or surrogate as well as nursing, discussions with consultants, evaluation of patient's response to treatment, examination of patient, obtaining history from patient or surrogate, ordering and performing treatments and interventions, ordering and review of laboratory studies, ordering and review of radiographic studies, pulse oximetry and re-evaluation of patient's condition.

## 2021-11-30 NOTE — Anesthesia Postprocedure Evaluation (Addendum)
Anesthesia Post Note  Patient: MCKINNLEY SMITHEY  Procedure(s) Performed: VIDEO BRONCHOSCOPY WITH ENDOBRONCHIAL ULTRASOUND (Right) BRONCHIAL NEEDLE ASPIRATION BIOPSIES HEMOSTASIS CONTROL     Patient location during evaluation: PACU Anesthesia Type: General Level of consciousness: awake and alert, oriented and patient cooperative Pain management: pain level controlled Vital Signs Assessment: post-procedure vital signs reviewed and stable Respiratory status: spontaneous breathing, nonlabored ventilation and respiratory function stable Cardiovascular status: blood pressure returned to baseline and unstable Postop Assessment: no apparent nausea or vomiting Anesthetic complications: no Comments: Baseline asymptomatic hypotension as low as SBPs in the 80s preop, however unable to sustain even SBPs in the 80s without low dose pressors despite fluid resuscitation w/ crystalloid and colloid. PACU RN communicated w/ primary team that he will likely need a stepdown bed overnight. Also saturating high 80s% with higher oxygen requirement than preop- 6LPM [place]. Encouraged pt to continue to try to cough and use incentive spirometer.    No notable events documented.  Last Vitals:  Vitals:   11/30/21 1507 11/30/21 1513  BP: 91/80 (!) 82/38  Pulse: 94 98  Resp: 19 17  Temp:    SpO2: 92% 93%    Last Pain:  Vitals:   11/30/21 1513  TempSrc:   PainSc: 0-No pain                 Pervis Hocking

## 2021-12-01 LAB — RENAL FUNCTION PANEL
Albumin: 1.8 g/dL — ABNORMAL LOW (ref 3.5–5.0)
Anion gap: 15 (ref 5–15)
BUN: 51 mg/dL — ABNORMAL HIGH (ref 8–23)
CO2: 21 mmol/L — ABNORMAL LOW (ref 22–32)
Calcium: 7.6 mg/dL — ABNORMAL LOW (ref 8.9–10.3)
Chloride: 97 mmol/L — ABNORMAL LOW (ref 98–111)
Creatinine, Ser: 7.23 mg/dL — ABNORMAL HIGH (ref 0.61–1.24)
GFR, Estimated: 7 mL/min — ABNORMAL LOW (ref >60)
Glucose, Bld: 213 mg/dL — ABNORMAL HIGH (ref 70–99)
Phosphorus: 6.5 mg/dL — ABNORMAL HIGH (ref 2.5–4.6)
Potassium: 3.7 mmol/L (ref 3.5–5.1)
Sodium: 133 mmol/L — ABNORMAL LOW (ref 135–145)

## 2021-12-01 LAB — CBC
HCT: 25.5 % — ABNORMAL LOW (ref 39.0–52.0)
Hemoglobin: 8.1 g/dL — ABNORMAL LOW (ref 13.0–17.0)
MCH: 33.8 pg (ref 26.0–34.0)
MCHC: 31.8 g/dL (ref 30.0–36.0)
MCV: 106.3 fL — ABNORMAL HIGH (ref 80.0–100.0)
Platelets: 126 10*3/uL — ABNORMAL LOW (ref 150–400)
RBC: 2.4 MIL/uL — ABNORMAL LOW (ref 4.22–5.81)
RDW: 19.4 % — ABNORMAL HIGH (ref 11.5–15.5)
WBC: 7.6 10*3/uL (ref 4.0–10.5)
nRBC: 0 % (ref 0.0–0.2)

## 2021-12-01 LAB — VANCOMYCIN, RANDOM: Vancomycin Rm: 21

## 2021-12-01 LAB — MAGNESIUM: Magnesium: 1.6 mg/dL — ABNORMAL LOW (ref 1.7–2.4)

## 2021-12-01 MED ORDER — CALCITONIN (SALMON) 200 UNIT/ACT NA SOLN
1.0000 | Freq: Every day | NASAL | Status: DC
  Administered 2021-12-01 – 2021-12-02 (×2): 1 via NASAL
  Filled 2021-12-01 (×2): qty 3.7

## 2021-12-01 MED ORDER — VANCOMYCIN HCL 750 MG/150ML IV SOLN
750.0000 mg | Freq: Once | INTRAVENOUS | Status: AC
  Administered 2021-12-02 (×2): 750 mg via INTRAVENOUS
  Filled 2021-12-01: qty 150

## 2021-12-01 MED ORDER — MAGNESIUM OXIDE -MG SUPPLEMENT 400 (240 MG) MG PO TABS
800.0000 mg | ORAL_TABLET | Freq: Two times a day (BID) | ORAL | Status: DC
  Administered 2021-12-01 – 2021-12-04 (×7): 800 mg via ORAL
  Filled 2021-12-01 (×7): qty 2

## 2021-12-01 MED ORDER — PANTOPRAZOLE SODIUM 40 MG PO TBEC
40.0000 mg | DELAYED_RELEASE_TABLET | Freq: Two times a day (BID) | ORAL | Status: DC
  Administered 2021-12-01 – 2021-12-04 (×7): 40 mg via ORAL
  Filled 2021-12-01 (×7): qty 1

## 2021-12-01 NOTE — Progress Notes (Signed)
PHARMACIST - PHYSICIAN COMMUNICATION  DR:   Beryle Lathe  CONCERNING: IV to Oral Route Change Policy  RECOMMENDATION: This patient is receiving Protonix by the intravenous route.  Based on criteria approved by the Pharmacy and Therapeutics Committee, the intravenous medication(s) is/are being converted to the equivalent oral dose form(s).   DESCRIPTION: These criteria include: The patient is eating (either orally or via tube) and/or has been taking other orally administered medications for a least 24 hours The patient has no evidence of active gastrointestinal bleeding or impaired GI absorption (gastrectomy, short bowel, patient on TNA or NPO).  If you have questions about this conversion, please contact the Pharmacy Department  []   [phone] )  [facility] []   [phone] )  [facility] [x]   [phone] )  [facility] []   [phone] )  [facility] []   [phone] )  [place], PharmD, BCPS Clinical Pharmacist 12/01/2021 9:12 AM

## 2021-12-01 NOTE — Progress Notes (Signed)
This is a progress note    NAME:  ABRIEL GEESEY, MRN:  [id number], DOB:  05/22/1933, LOS: 3 ADMISSION DATE:  11/27/2021, CONSULTATION DATE: 09/30/2021 REFERRING MD: [facility], CHIEF COMPLAINT: Back pain  History of Present Illness:  87 year old male former smoker patient of Dr. Christinia Gully who has ongoing COPD, emphysema O2 dependent at 3 to 4 L nasal cannula.  Constant chronic cough he has been worked up for dysphagia in the past swallowing evaluation without problem.  He is on peritoneal dialysis.  Reports that his weight is constant at approximately 87 kg.  Denies fevers chills sweats or productive cough.  He did have a thoracentesis done in October 2022.  His chief complaint was back pain.  He does have an L2 compression fracture.  CT evidence reveals left-sided pneumonia he is being evaluated for fiberoptic bronchoscopy on 09/30/2021 for visualization and clearing of suspected mucoid impaction.  There is some question of malignancy this will also be addressed at that time.  Pertinent  Medical History   Past Medical History:  Diagnosis Date   Alcohol abuse    Colon cancer ([place])    COPD (chronic obstructive pulmonary disease) ([place])    Emphysema of lung ([place])    Enteritis due to Clostridium difficile    ESRD (end stage renal disease) ([place])    Former tobacco use    Hypertension    Peripheral edema    Sepsis ([place]) 10/2016   Aspiration PNA/C diff colitis     Significant Hospital Events: Including procedures, antibiotic start and stop dates in addition to other pertinent events   11/28/2021 pulmonary consult plan for fiberoptic bronchoscopy 11/30/2021  Interim History / Subjective:  No acute events overnight. Patient in step down unit for closer monitoring of BP after bronch yesterday.   He denies any hemoptysis. No chest pain.   Objective   Blood pressure (!) 85/50, pulse 89, temperature 97.7 F (36.5 C), temperature source Axillary, resp. rate 18, height 5\' 10"  (1.778 m), weight  83.9 kg, SpO2 93 %.        Intake/Output Summary (Last 24 hours) at 12/01/2021 1442 Last data filed at 12/01/2021 1937 Gross per 24 hour  Intake 0 ml  Output 1162 ml  Net -1162 ml   Filed Weights   11/29/21 1946 11/30/21 1151 11/30/21 1930  Weight: 80 kg 85.3 kg 83.9 kg    Examination: General: no acute distress, elderly male HENT: No JVD or lymphadenopathy appreciated Lungs: diminished breath sounds on right Cardiovascular: Heart sounds are distant Abdomen: Soft nontender peritoneal catheter in place Extremities: Mild edema Neuro: Grossly intact without focal defect  Resolved Hospital Problem list     Assessment & Plan:  Acute on chronic hypoxic respiratory failure multifocal.  Gold B COPD, emphysema, persistent pleural effusion, status post thoracentesis in September 28, 2021.  Multiple episodes of pneumonia recently treated with Augmentin.  Presented to the pulmonary office on 11/27/2021 increasing back pain, increasing shortness of breath, chronic cough and generalized failure to thrive.  Worrisome for malignancy vs. Mucus impaction - s/p bronchoscopy with EBUS and TBNA of stations 4R and 7 - abnormal airway mucose noted in the left bronchial tree concerning for malignancy. - will follow up pathology results. - Recommend palliative care consult - abx as ordered  End-stage renal disease Continue peritoneal dialysis Continue midodrine  Compression fractures at L1 Pain relief as needed Further outpatient evaluation  History of atrial fibrillation AC on hold  Freda Jackson, MD [place]  Office: [phone]   See Amion for personal pager PCCM on call pager [phone] until 7pm. Please call Elink 7p-7a. [phone]

## 2021-12-01 NOTE — Progress Notes (Addendum)
Subjective:  seen in Room, denies SOB, Tolerated PD.  Objective Vital signs in last 24 hours: Vitals:   12/01/21 0300 12/01/21 0753 12/01/21 0926 12/01/21 1124  BP: (!) 80/55 (!) 88/52 91/61 (!) 85/50  Pulse: 95 84  89  Resp: 19 12 (!) 22 18  Temp: 97.8 F (36.6 C) 97.8 F (36.6 C)  97.7 F (36.5 C)  TempSrc: Oral Axillary  Axillary  SpO2: 90% 91% 90% 93%  Weight:      Height:       Weight change: 5.276 kg  Physical Exam: General: alert Male NAD Heart: RRR, No MRG Lungs: Scattered rales  no sob  Abdomen: NABS, Soft, NT, ND Extremities: No pedal  edema Dialysis Access: PD Cath    Dialysis Orders:  CCPD - 7x week 84.5kg, 6 exchanges, fill vol 3L last fill 1.5L Dwell time 1h 58min   Problem/Plan: Multifocal pneumonia with loculated effusion/outpatient failed Augmentin RX PNA = pulmonary consulting 12/12 /22  bronchoscopy biopsy/cytology pending, R/o malignancy, is on Zosyn/ vancomycin.  ESRD -on CCPD use all 1.5 % next pd  Hypokalemia= this a.m. 3.7 improved with p.o. supplement  Anemia -Hgb 8.1< 9.3/iron sat 53%, noted transfuse 1 unit 12/10, had epistasis on 11/26, hold ESA until bronc results back Secondary hyperparathyroidism -Renvela as binder, Phos 6.5 corrected calcium okay continue calcitriol HTN/volume -hypotensive readings but asymp. and on exam, no excess volume , noted yest. BP dropped pre bronch,sec. to  anesth.meds //midodrine increased to 20 mg  tid, UF as tolerated, noted start midodrine 5 mg 3 PD(on 11/28/21 start)  105/58  this am on exam  A. fib/CHF with EF 30%= metoprolol stopped as outpatient secondary to low BP Eliquis on hold for procedure Chronic respiratory failure= on home O2 2 to 2.5 L, pulmonology plans for block with EBUS today and cryo History of L1 fracture 25%= plans per admit not a current candidate for kypho pain mod. controlled  RX per admit oxycodone/mobilize with PT/OT  Ernest Haber, PA-C [facility]  [phone] 12/01/2021,11:57 AM  LOS: 3 days   Labs: Basic Metabolic Panel: Recent Labs  Lab 11/29/21 0631 11/29/21 1628 11/29/21 2109 11/30/21 0346 12/01/21 0133  NA 132*  --  135 134* 133*  K 3.3*   < > 3.7 3.8 3.7  CL 93*  --  96* 95* 97*  CO2 25  --  24 26 21*  GLUCOSE 120*  --  131* 108* 213*  BUN 59*  --  58* 54* 51*  CREATININE 7.67*  --  8.13* 7.89* 7.23*  CALCIUM 7.7*  --  8.1* 7.9* 7.6*  PHOS 6.5*  --   --  6.2* 6.5*   < > = values in this interval not displayed.   Liver Function Tests: Recent Labs  Lab 11/27/21 1828 11/28/21 0441 11/29/21 0631 11/30/21 0346 12/01/21 0133  AST 21 23  --   --   --   ALT 10 13  --   --   --   ALKPHOS 72 70  --   --   --   BILITOT 0.5 1.1  --   --   --   PROT 6.0* 5.0*  --   --   --   ALBUMIN 3.1* 2.0* 1.8* 1.8* 1.8*   No results for input(s): LIPASE, AMYLASE in the last 168 hours. No results for input(s): AMMONIA in the last 168 hours. CBC: Recent Labs  Lab 11/27/21 1828 11/28/21 0441 11/29/21 0631 11/30/21 0346 12/01/21 0133  WBC 12.0* 10.2 10.8* 10.2 7.6  NEUTROABS 9.6* 8.1*  --   --   --   HGB 8.2* 7.5* 9.0* 9.3* 8.1*  HCT 25.2* 22.5* 26.6* 28.2* 25.5*  MCV 105.4* 106.6* 102.3* 102.9* 106.3*  PLT 182 165 146* 143* 126*   Cardiac Enzymes: No results for input(s): CKTOTAL, CKMB, CKMBINDEX, TROPONINI in the last 168 hours. CBG: No results for input(s): GLUCAP in the last 168 hours.  Studies/Results: No results found. Medications:  dialysis solution 1.5% low-MG/low-CA     piperacillin-tazobactam (ZOSYN)  IV 2.25 g (12/01/21 0907)    calcitonin (salmon)  1 spray Alternating Nares Daily   gentamicin cream  1 application Topical Daily   magnesium oxide  800 mg Oral BID   metoprolol succinate  12.5 mg Oral Daily   midodrine  20 mg Oral TID WC   pantoprazole  40 mg Oral BID AC   polyethylene glycol  17 g Oral Daily   sevelamer carbonate  800 mg Oral TID WC   vancomycin variable dose per unstable renal function  (pharmacist dosing)   Does not apply See admin instructions

## 2021-12-01 NOTE — Progress Notes (Addendum)
PROGRESS NOTE   David Barton  IRW:[id number] DOB: [birth year] DOA: 11/27/2021 PCP: Chesley Noon, MD  Brief Narrative:   87 year old white male home dwelling known COPD emphysema respiratory failure 4 L oxygen for the past month-prior Pleural effusions 2020 Permanent A. 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 difficile 2017 OSA Seen in pulmonary office 11/27/2021 after being treated on 11/25 with Augmentin and steroids-he completed those but experienced worsening once again-x-ray in office showed right upper lobe airspace disease?  Multi lobar pneumonia patient was referred to ED On work-up found to have 25% L1 compression fracture Also found to have hemoglobin down from 10-8.2 with guaiac positive stools  Hospital-Problem based course   Outpatient failed Augmentin treatment pneumonia ?  Postobstructive pneumonia  with loculated effusion Continue Zosyn and vancomycin started 12/10 Pulmonology input appreciated-follow biopsies cytology from bronchoscopy 12/12 and then wean antibiotics Continue baseline oxygen 4L Get CXR in am Diarrhea-resolved Last BM unformed 2 to 3 days back,  wife indicates started after abx--not bloody Eliquis 2.5 twice daily from prior to admission held Epistaxis on 11/26 Acute blood loss anemia Profuse epistaxis Thanksgiving evening ~ 8 hour Transfuse 1 U 12/10-hemoglobin in the 8-9 range-transfusion threshold is below 7 ESRD on peritoneal dialysis hypokalemia Replaced and now resolved Wght seems up 3 kg Rest as per renal in terms of dialysis ,peritoneal dialysate Increased midodrine 12/12 to 20 tid  Impaired glycemic control Recent steroid use Get A1c, but if majority of sugars below 180, probably no need for coverage Permanent A. fib CHADS2 score >4 previously on Xarelto Sinus/Afib on monitor-not on any rate controlling agent, 2/2 prior low blood  pressures Started metoprolol xl 12.5--keep map above 65 Xarelto on hold for now L1 fracture 25% Mobilize with PT/OT Not a current candidate for Kypho given peri-op issues hypoxia etc.--trial nasal calcitonin Continue oxycodone ir 5 every 4 as needed moderate pain Tylenol first choice  DVT prophylaxis: scd Code Status: full Family Communication: wife Archie Patten [phone] updated at bedside on 12/13 Disposition:  Status is: Inpatient  Remains inpatient appropriate because: need for work up He will need SNF at discharge vs 24/7 care  Consultants:  pulm  Procedures:   Antimicrobials: vanc zosyn from admit    Subjective:  Awake coherent to several questions about his dialysate--seems a little frustrated at the timing of this Overall feels shortness of breath is better   Objective: Vitals:   11/30/21 2308 12/01/21 0300 12/01/21 0753 12/01/21 0926  BP: (!) 88/52 (!) 80/55 (!) 88/52 91/61  Pulse: 91 95 84   Resp: 16 19 12  (!) 22  Temp: 97.6 F (36.4 C) 97.8 F (36.6 C) 97.8 F (36.6 C)   TempSrc: Oral Oral Axillary   SpO2: 92% 90% 91% 90%  Weight:      Height:        Intake/Output Summary (Last 24 hours) at 12/01/2021 1109 Last data filed at 12/01/2021 0926 Gross per 24 hour  Intake 500 ml  Output 1162 ml  Net -662 ml    Filed Weights   11/29/21 1946 11/30/21 1151 11/30/21 1930  Weight: 80 kg 85.3 kg 83.9 kg    Examination:  EOMI NCAT no pallor on oxygen Cta b some rales posterolaterally L>R side S1 s2no m/r/g Abd soft nt nd no rebound  Psych euthymic but very HOH Neuro intact to power moving limbs appropriately  Data Reviewed: personally  reviewed   CBC    Component Value Date/Time   WBC 7.6 12/01/2021 0133   RBC 2.40 (L) 12/01/2021 0133   HGB 8.1 (L) 12/01/2021 0133   HGB 14.1 12/21/2018 1038   HGB 14.9 11/24/2009 0958   HCT 25.5 (L) 12/01/2021 0133   HCT 30.1 (L) 06/09/2019 1329   HCT 43.8 11/24/2009 0958   PLT 126 (L) 12/01/2021 0133   PLT  276 12/21/2018 1038   MCV 106.3 (H) 12/01/2021 0133   MCV 101 (H) 12/21/2018 1038   MCV 108.6 (H) 11/24/2009 0958   MCH 33.8 12/01/2021 0133   MCHC 31.8 12/01/2021 0133   RDW 19.4 (H) 12/01/2021 0133   RDW 12.5 12/21/2018 1038   RDW 15.4 (H) 11/24/2009 0958   LYMPHSABS 0.8 11/28/2021 0441   LYMPHSABS 2.2 11/24/2009 0958   MONOABS 1.0 11/28/2021 0441   MONOABS 0.6 11/24/2009 0958   EOSABS 0.1 11/28/2021 0441   EOSABS 0.2 11/24/2009 0958   BASOSABS 0.0 11/28/2021 0441   BASOSABS 0.0 11/24/2009 0958   CMP Latest Ref Rng & Units 12/01/2021 11/30/2021 11/29/2021  Glucose 70 - 99 mg/dL 213(H) 108(H) 131(H)  BUN 8 - 23 mg/dL 51(H) 54(H) 58(H)  Creatinine 0.61 - 1.24 mg/dL 7.23(H) 7.89(H) 8.13(H)  Sodium 135 - 145 mmol/L 133(L) 134(L) 135  Potassium 3.5 - 5.1 mmol/L 3.7 3.8 3.7  Chloride 98 - 111 mmol/L 97(L) 95(L) 96(L)  CO2 22 - 32 mmol/L 21(L) 26 24  Calcium 8.9 - 10.3 mg/dL 7.6(L) 7.9(L) 8.1(L)  Total Protein 6.5 - 8.1 g/dL - - -  Total Bilirubin 0.3 - 1.2 mg/dL - - -  Alkaline Phos 38 - 126 U/L - - -  AST 15 - 41 U/L - - -  ALT 0 - 44 U/L - - -     Radiology Studies: No results found.   Scheduled Meds:  gentamicin cream  1 application Topical Daily   magnesium oxide  800 mg Oral BID   metoprolol succinate  12.5 mg Oral Daily   midodrine  20 mg Oral TID WC   pantoprazole  40 mg Oral BID AC   polyethylene glycol  17 g Oral Daily   sevelamer carbonate  800 mg Oral TID WC   vancomycin variable dose per unstable renal function (pharmacist dosing)   Does not apply See admin instructions   Continuous Infusions:  dialysis solution 1.5% low-MG/low-CA     piperacillin-tazobactam (ZOSYN)  IV 2.25 g (12/01/21 0907)     LOS: 3 days   Time spent: 25  Nita Sells, MD [facility] To contact the attending provider between 7A-7P or the covering provider during after hours 7P-7A, please log into the web site [email or web address] and access using universal [place]  password for that web site. If you do not have the password, please call the hospital operator.  12/01/2021, 11:09 AM

## 2021-12-01 NOTE — Progress Notes (Signed)
Physical Therapy Treatment Patient Details Name: David Barton MRN: [id number] DOB: 1933-10-04 Today's Date: 12/01/2021   History of Present Illness 87 year old male  presents to the ER 11/28/21 from the pulmonary office.  Patient was sent to the ER due to pneumonia seen on chest x-ray. hypotensive; compression fx L1;   PMH-- end-stage renal disease on peritoneal dialysis, COPD, recurrent pneumonia, A. fib on anticoagulation, colon Ca, HTN    PT Comments    Patient continues to be limited by back pain and refusing to adhere to back precautions with bed mobility. Requires 2 person moderate assist for supine to sit, but was able to walk 35 ft today with RW. Continue to recommend SNF.    Recommendations for follow up therapy are one component of a multi-disciplinary discharge planning process, led by the attending physician.  Recommendations may be updated based on patient status, additional functional criteria and insurance authorization.  Follow Up Recommendations  Skilled nursing-short term rehab (<3 hours/day)     Assistance Recommended at Discharge Frequent or constant Supervision/Assistance  Equipment Recommendations  None recommended by PT    Recommendations for Other Services       Precautions / Restrictions Precautions Precautions: Fall Precaution Comments: Reviewed back precautions and log roll technique; pt refuses to logroll     Mobility  Bed Mobility Overal bed mobility: Needs Assistance Bed Mobility: Supine to Sit     Supine to sit: HOB elevated;Mod assist;+2 for physical assistance     General bed mobility comments: Pt refused to roll due to pain and insisted on moving legs over EOB and pulling up to sit--which was also painful. Again educated on rolling and side to sit and states he will try next time    Transfers Overall transfer level: Needs assistance Equipment used: Rolling walker (2 wheels) Transfers: Sit to/from Stand Sit to Stand: Min assist;+2  safety/equipment                Ambulation/Gait Ambulation/Gait assistance: Min assist;+2 safety/equipment Gait Distance (Feet): 35 Feet Assistive device: Rolling walker (2 wheels) Gait Pattern/deviations: Step-through pattern;Decreased stride length       General Gait Details: upright posture and good proximity to RW; sats dropped to 85% on 6L O2 while walking   Stairs             Wheelchair Mobility    Modified Rankin (Stroke Patients Only)       Balance Overall balance assessment: Needs assistance Sitting-balance support: No upper extremity supported;Feet supported Sitting balance-Leahy Scale: Fair     Standing balance support: Bilateral upper extremity supported;Reliant on assistive device for balance Standing balance-Leahy Scale: Poor                              Cognition Arousal/Alertness: Awake/alert Behavior During Therapy: WFL for tasks assessed/performed Overall Cognitive Status: Within Functional Limits for tasks assessed                                          Exercises      General Comments General comments (skin integrity, edema, etc.): On 5L at rest and increased to 6L for walking (due to tank does not have 5L option).      Pertinent Vitals/Pain Pain Assessment: Faces Faces Pain Scale: Hurts whole lot Pain Location: Back Pain Descriptors / Indicators: Aching;Discomfort;Grimacing Pain Intervention(s):  Limited activity within patient's tolerance;Monitored during session;Other (comment) (educated on back precautions and purpose of rolling onto his side prior to sitting--he agreed to try next time)    Home Living                          Prior Function            PT Goals (current goals can now be found in the care plan section) Acute Rehab PT Goals Patient Stated Goal: back to feel better PT Goal Formulation: With patient Time For Goal Achievement: 12/13/21 Potential to Achieve Goals:  Good Progress towards PT goals: Progressing toward goals    Frequency    Min 3X/week      PT Plan Current plan remains appropriate    Co-evaluation              AM-PAC PT "6 Clicks" Mobility   Outcome Measure  Help needed turning from your back to your side while in a flat bed without using bedrails?: A Little Help needed moving from lying on your back to sitting on the side of a flat bed without using bedrails?: Total Help needed moving to and from a bed to a chair (including a wheelchair)?: A Little Help needed standing up from a chair using your arms (e.g., wheelchair or bedside chair)?: A Little Help needed to walk in hospital room?: Total Help needed climbing 3-5 steps with a railing? : Total 6 Click Score: 12    End of Session Equipment Utilized During Treatment: Oxygen Activity Tolerance: Treatment limited secondary to medical complications (Comment) (decr sats; back pain) Patient left: with call bell/phone within reach;in chair;with chair alarm set   PT Visit Diagnosis: Difficulty in walking, not elsewhere classified (R26.2);Pain Pain - Right/Left:  (midline) Pain - part of body:  (back)     Time: 5726-2035 PT Time Calculation (min) (ACUTE ONLY): 17 min  Charges:  $Gait Training: 8-22 mins                      Arby Barrette, PT [place]  Pager [phone] Office [phone]    Rexanne Mano 12/01/2021, 3:02 PM

## 2021-12-01 NOTE — Care Management Important Message (Signed)
Important Message  Patient Details  Name: David Barton MRN: [id number] Date of Birth: 05/29/1933   Medicare Important Message Given:  Yes     [name] 12/01/2021, 3:21 PM

## 2021-12-01 NOTE — TOC Initial Note (Signed)
Transition of Care [facility]) - Initial/Assessment Note    Patient Details  Name: David Barton MRN: [id number] Date of Birth: October 14, 1933  [facility]) CM/SW Contact:    Benard Halsted, LCSW Phone Number: 12/01/2021, 4:12 PM  Clinical Narrative:                 CSW received consult for possible SNF placement at time of discharge. CSW spoke with patient. Patient reported that patient's spouse is currently unable to care for patient at their home given patients current physical needs and fall risk but that he is currently on perotineal dialysis at home. CSW explained that he would need to convert to hemodialysis if he were to go to SNF. Patient stated concern with that and stated he could not because he did not have any successful access for that. Patient stated that he believes he has cancer and if so will not continue dialysis and will not put himself through chemo treatments "just to prolong life an extra month". CSW will follow up with patient when medical plan is more clear. He provided permission to speak with his wife.   Skilled Nursing Rehab Facilities-   [email or web address]   Ratings out of 5 possible   Name Address  Phone # Rennert Inspection Overall  [facility] [address] [place] 5 5 2 4   [facility]  [address] [place], [place] [phone] 4 2 5 5   [facility] [place], [place] 4 1 1 1   [place], [place] 2 2 4 4   [facility] [address] [place] 2 1 1 1   [place] 3 1 4 3   [facility] [address] [place] 5 2 2 3   [facility] [address] [place] 4 1 2 1   [facility] [place] 5 1 2 2   [facility] [phone] Wireless  Dr, Lady Gary [phone] 4 1 1 1   [facility] [address] [facility] [phone] 4 1 2 1   [place] [address] [place] [phone] 4 1 1 1         Barriers to Discharge: Continued Medical Work up   Patient Goals and CMS Choice   CMS Medicare.gov Compare Post Acute Care list provided to:: Patient Choice offered to / list presented to : Patient  Expected Discharge Plan and Services   In-house Referral: Clinical Social Work     Living arrangements for the past 2 months: Single Family Home                                      Prior Living Arrangements/Services Living arrangements for the past 2 months: Single Family Home Lives with:: Spouse Patient language and need for interpreter reviewed:: Yes Do you feel safe going back to the place where you live?: Yes      Need for Family Participation in Patient Care: Yes (Comment) Care giver support system in place?: Yes (comment)   Criminal Activity/Legal Involvement Pertinent to Current Situation/Hospitalization: No - Comment as needed  Activities of Daily Living Home Assistive Devices/Equipment: Oxygen, Walker (specify type) ADL Screening (condition at time of admission) Patient's cognitive ability adequate to safely complete daily activities?: Yes Is the patient deaf or have  difficulty hearing?: Yes Does the patient have difficulty seeing, even when wearing glasses/contacts?: No Does the patient have difficulty concentrating, remembering, or making decisions?: No Patient able to express need for assistance with ADLs?: Yes Does the patient have difficulty dressing or bathing?: No Independently performs ADLs?: No Communication: Independent with device (comment) Dressing (OT): Needs assistance Is this a change from baseline?: Change from baseline, expected to last <3days Grooming: Independent Feeding: Independent Bathing: Needs assistance Is this a change from baseline?:  Pre-admission baseline Toileting: Needs assistance Is this a change from baseline?: Pre-admission baseline In/Out Bed: Needs assistance Is this a change from baseline?: Change from baseline, expected to last <3 days Walks in Home: Independent with device (comment) Does the patient have difficulty walking or climbing stairs?: Yes (respiratory effort) Weakness of Legs: None Weakness of Arms/Hands: None  Permission Sought/Granted Permission sought to share information with : Facility Sport and exercise psychologist, Family Supports Permission granted to share information with : Yes, Verbal Permission Granted  Share Information with NAME: Archie Patten  Permission granted to share info w AGENCY: SNFs  Permission granted to share info w Relationship: Spouse  Permission granted to share info w Contact Information: [phone]  Emotional Assessment Appearance:: Appears stated age Attitude/Demeanor/Rapport: Engaged Affect (typically observed): Accepting, Appropriate, Frustrated Orientation: : Oriented to Self, Oriented to Place, Oriented to  Time, Oriented to Situation Alcohol / Substance Use: Not Applicable Psych Involvement: No (comment)  Admission diagnosis:  End-stage renal disease on peritoneal dialysis ([place]) [N18.6, Z99.2] Gastrointestinal hemorrhage, unspecified gastrointestinal hemorrhage type [K92.2] Multifocal pneumonia [J18.9] Anemia, unspecified type [D64.9] Community acquired pneumonia, unspecified laterality [J18.9] Compression fracture of L1 vertebra, initial encounter ([place]) [S32.010A] Patient Active Problem List   Diagnosis Date Noted   GI bleed 11/28/2021   Closed compression fracture of body of L1 vertebra ([place]) 11/28/2021   Multifocal pneumonia 11/27/2021   Community acquired pneumonia of right lung 11/13/2021   Medication management 10/17/2020   Healthcare maintenance 10/17/2020   Musculoskeletal back pain 09/17/2020   Chronic anticoagulation 07/11/2019   Chronic bilateral  pleural effusions  R>L 06/28/2019   Macrocytic anemia 06/07/2019   Lung infiltrate    Shortness of breath    DNR (do not resuscitate)    Palliative care by specialist    S/P thoracentesis    SOB (shortness of breath)    Leukocytosis    ESRD (end stage renal disease) ([place])    Acute on chronic combined systolic and diastolic heart failure ([place])    COPD GOLD II     Weakness generalized 05/30/2019   Acute on chronic respiratory failure ([place]) 05/19/2019   Chronic respiratory failure with hypoxia and hypercapnia ([place]) 05/06/2019   DOE (dyspnea on exertion) 05/04/2019   Chronic respiratory failure with hypoxia ([place]) 04/25/2019   History of anemia due to chronic kidney disease 04/17/2019   Pressure injury of skin 03/17/2019   Longstanding persistent atrial fibrillation ([place]) 03/11/2019   Chronic diastolic heart failure ([place]) 01/17/2019   Edema of both legs 01/02/2019   Pulmonary edema    Sleep apnea    Elevated troponin 11/10/2016   Cardiomyopathy ([place]) 11/10/2016   PSVT (paroxysmal supraventricular tachycardia) ([place]) 11/10/2016   Dysphagia    Aspiration pneumonia ([place]) 11/04/2016   Diarrhea 11/04/2016   Essential hypertension 05/31/2013   COPD with acute exacerbation ([place]) 05/29/2013   Hypokalemia 05/08/2013   Hypotension 05/08/2013   Dizziness 05/08/2013   Hypoxia 05/08/2013   Acute respiratory failure ([place]) 05/08/2013   PCP:  Chesley Noon, MD Pharmacy:  [facility] [place], [place] AT [address] [place] [address] Phone: [phone] Fax: [phone]  [place], [place] [address] Phone: [phone] Fax: [phone]     Social Determinants of Health (SDOH) Interventions    Readmission Risk Interventions Readmission Risk Prevention Plan 03/20/2019  Transportation Screening Complete  PCP or  Specialist Appt within 5-7 Days Complete  Home Care Screening Complete  Medication Review (RN CM) Complete  Some recent data might be hidden

## 2021-12-01 NOTE — Progress Notes (Signed)
Pharmacy Antibiotic Note  LEVAR FAYSON is a 87 y.o. male admitted on 11/27/2021 with pneumonia; pt had been on ABX as outpt without improvement, new CXR shows persistent lower-lobe PNA and new upper-lobe PNA.  Pharmacy has been consulted for vancomycin and Zosyn dosing.  Pt on PD for ESRD.  Bcx:  12/9: ngtd   12/11: Vanc level: 20 mcg/mL  12/13: Vancomycin level 21  Plan: Vanc 750 mg IV x 1 12/14 at 10 am  Continue Zosyn 2.25g IV Q8H.  Height: 5\' 10"  (177.8 cm) Weight: 83.9 kg (184 lb 15.5 oz) IBW/kg (Calculated) : 73  Temp (24hrs), Avg:97.8 F (36.6 C), Min:97.5 F (36.4 C), Max:98.6 F (37 C)  Recent Labs  Lab 11/27/21 1828 11/28/21 0441 11/29/21 0631 11/29/21 2109 11/30/21 0346 12/01/21 0133 12/01/21 1547  WBC 12.0* 10.2 10.8*  --  10.2 7.6  --   CREATININE 7.24* 7.99* 7.67* 8.13* 7.89* 7.23*  --   LATICACIDVEN 1.3 2.3*  --   --   --   --   --   VANCORANDOM  --   --  20  --   --   --  21     Estimated Creatinine Clearance: 7.7 mL/min (A) (by C-G formula based on SCr of 7.23 mg/dL (H)).    Allergies  Allergen Reactions   Amlodipine Swelling   Tizanidine Other (See Comments)    Other reaction(s): hypotension   Thank you Anette Guarneri, PharmD

## 2021-12-02 ENCOUNTER — Encounter (HOSPITAL_COMMUNITY): Payer: Self-pay | Admitting: Pulmonary Disease

## 2021-12-02 ENCOUNTER — Inpatient Hospital Stay (HOSPITAL_COMMUNITY): Payer: Medicare Other

## 2021-12-02 DIAGNOSIS — D649 Anemia, unspecified: Secondary | ICD-10-CM

## 2021-12-02 DIAGNOSIS — Z515 Encounter for palliative care: Secondary | ICD-10-CM

## 2021-12-02 DIAGNOSIS — Z7189 Other specified counseling: Secondary | ICD-10-CM

## 2021-12-02 LAB — CBC
HCT: 27 % — ABNORMAL LOW (ref 39.0–52.0)
Hemoglobin: 8.6 g/dL — ABNORMAL LOW (ref 13.0–17.0)
MCH: 33.9 pg (ref 26.0–34.0)
MCHC: 31.9 g/dL (ref 30.0–36.0)
MCV: 106.3 fL — ABNORMAL HIGH (ref 80.0–100.0)
Platelets: 149 10*3/uL — ABNORMAL LOW (ref 150–400)
RBC: 2.54 MIL/uL — ABNORMAL LOW (ref 4.22–5.81)
RDW: 19 % — ABNORMAL HIGH (ref 11.5–15.5)
WBC: 15.1 10*3/uL — ABNORMAL HIGH (ref 4.0–10.5)
nRBC: 0 % (ref 0.0–0.2)

## 2021-12-02 LAB — COMPREHENSIVE METABOLIC PANEL
ALT: 11 U/L (ref 0–44)
AST: 20 U/L (ref 15–41)
Albumin: 1.8 g/dL — ABNORMAL LOW (ref 3.5–5.0)
Alkaline Phosphatase: 73 U/L (ref 38–126)
Anion gap: 14 (ref 5–15)
BUN: 53 mg/dL — ABNORMAL HIGH (ref 8–23)
CO2: 23 mmol/L (ref 22–32)
Calcium: 7.7 mg/dL — ABNORMAL LOW (ref 8.9–10.3)
Chloride: 96 mmol/L — ABNORMAL LOW (ref 98–111)
Creatinine, Ser: 6.63 mg/dL — ABNORMAL HIGH (ref 0.61–1.24)
GFR, Estimated: 8 mL/min — ABNORMAL LOW (ref >60)
Glucose, Bld: 147 mg/dL — ABNORMAL HIGH (ref 70–99)
Potassium: 3.5 mmol/L (ref 3.5–5.1)
Sodium: 133 mmol/L — ABNORMAL LOW (ref 135–145)
Total Bilirubin: 0.8 mg/dL (ref 0.3–1.2)
Total Protein: 4.9 g/dL — ABNORMAL LOW (ref 6.5–8.1)

## 2021-12-02 LAB — CULTURE, BLOOD (ROUTINE X 2)
Culture: NO GROWTH
Culture: NO GROWTH
Special Requests: ADEQUATE
Special Requests: ADEQUATE

## 2021-12-02 LAB — MAGNESIUM: Magnesium: 1.8 mg/dL (ref 1.7–2.4)

## 2021-12-02 LAB — PHOSPHORUS: Phosphorus: 6.6 mg/dL — ABNORMAL HIGH (ref 2.5–4.6)

## 2021-12-02 LAB — CYTOLOGY - NON PAP

## 2021-12-02 MED ORDER — ARFORMOTEROL TARTRATE 15 MCG/2ML IN NEBU
15.0000 ug | INHALATION_SOLUTION | Freq: Two times a day (BID) | RESPIRATORY_TRACT | Status: DC
  Administered 2021-12-02 – 2021-12-04 (×4): 15 ug via RESPIRATORY_TRACT
  Filled 2021-12-02 (×6): qty 2

## 2021-12-02 MED ORDER — DELFLEX-LC/2.5% DEXTROSE 394 MOSM/L IP SOLN
INTRAPERITONEAL | Status: DC
  Administered 2021-12-03: 5000 mL via INTRAPERITONEAL

## 2021-12-02 MED ORDER — DELFLEX-LM/2.5% DEXTROSE 396 MOSM/L IP SOLN: Freq: Once | INTRAPERITONEAL | Status: DC

## 2021-12-02 MED ORDER — REVEFENACIN 175 MCG/3ML IN SOLN
175.0000 ug | Freq: Every day | RESPIRATORY_TRACT | Status: DC
  Administered 2021-12-04: 175 ug via RESPIRATORY_TRACT
  Filled 2021-12-02 (×6): qty 3

## 2021-12-02 MED ORDER — DELFLEX-LC/1.5% DEXTROSE 344 MOSM/L IP SOLN
INTRAPERITONEAL | Status: DC
  Administered 2021-12-03: 5000 mL via INTRAPERITONEAL

## 2021-12-02 NOTE — Progress Notes (Signed)
Subjective: Seen in room, denies shortness of breath, tolerated PD last night.  Used all 1.5%, states eating /drinking okay, use more 2.5 this a.m.  Noted soft BPs and totally asymptomatic  Objective Vital signs in last 24 hours: Vitals:   12/02/21 0018 12/02/21 0425 12/02/21 0500 12/02/21 0736  BP: (!) 106/95 (!) 88/44  (!) 96/42  Pulse: 76 80  90  Resp: 15 16  20   Temp: 97.8 F (36.6 C) 97.7 F (36.5 C)  97.7 F (36.5 C)  TempSrc: Oral Oral  Oral  SpO2: 94% 91%  92%  Weight:   88.5 kg   Height:       Weight change: -6.376 kg    Physical Exam: General: alert Male NAD Heart: RRR, No MRG Lungs: Scattered rales/rhonchi no sob  Abdomen: NABS, Soft, NT, ND Extremities: Trace bi pedal  edema Dialysis Access: PD Cath present nontender no discharge   Dialysis Orders:  CCPD - 7x week 84.5kg, 6 exchanges, fill vol 3L last fill 1.5L Dwell time 1h 8min   Problem/Plan: Multifocal pneumonia with loculated effusion/outpatient failed Augmentin RX PNA = pulmonary consulting 12/12 /22  bronchoscopy biopsy/cytology pending, R/o malignancy, on Zosyn/ vancomycin.  ESRD -on CCPD use 2 of 2.5%bags  tonight, and 3 of 1.5 % next pd  Hypokalemia= this a.m. 3.5 improved with p.o. supplement  Anemia -Hgb 8.6 <8.1< 9.3/iron sat 53%, noted transfuse 1 unit 12/10, had epistasis on 11/26, hold ESA until bronc results back Secondary hyperparathyroidism -Renvela as binder, Phos 6.6 corrected calcium okay continue calcitriol HTN/volume -hypotensive readings but asymp. ,now trace pedal edema , BP Dropped on 12/12. BP  pre bronch,sec. to prob. anesth.meds //midodrine increased to 20 mg  tid, UF as tolerated, noted start midodrine 5 mg 3 PD(on 11/28/21 start) with patient having more p.o. intake , use 2.5 % x 2  and remainder 1.5 %  on PD ( NOTED NO URINE with Bladder scan this am ) A. fib/CHF with EF 30%= metoprolol stopped as outpatient secondary to low BP Eliquis on hold for procedure Chronic respiratory  failure= on home O2 2 to 2.5 L, pulmonology plans for block with EBUS today and cryo History of L1 fracture 25%= plans per admit not a current candidate for kypho pain mod. controlled  RX per admit oxycodone/mobilize with PT/OT  Ernest Haber, PA-C [facility] [phone] 12/02/2021,11:34 AM  LOS: 4 days   Labs: Basic Metabolic Panel: Recent Labs  Lab 11/30/21 0346 12/01/21 0133 12/02/21 0129  NA 134* 133* 133*  K 3.8 3.7 3.5  CL 95* 97* 96*  CO2 26 21* 23  GLUCOSE 108* 213* 147*  BUN 54* 51* 53*  CREATININE 7.89* 7.23* 6.63*  CALCIUM 7.9* 7.6* 7.7*  PHOS 6.2* 6.5* 6.6*   Liver Function Tests: Recent Labs  Lab 11/27/21 1828 11/28/21 0441 11/29/21 0631 11/30/21 0346 12/01/21 0133 12/02/21 0129  AST 21 23  --   --   --  20  ALT 10 13  --   --   --  11  ALKPHOS 72 70  --   --   --  73  BILITOT 0.5 1.1  --   --   --  0.8  PROT 6.0* 5.0*  --   --   --  4.9*  ALBUMIN 3.1* 2.0*   < > 1.8* 1.8* 1.8*   < > = values in this interval not displayed.   No results for input(s): LIPASE, AMYLASE in the last 168 hours. No  results for input(s): AMMONIA in the last 168 hours. CBC: Recent Labs  Lab 11/27/21 1828 11/28/21 0441 11/29/21 0631 11/30/21 0346 12/01/21 0133 12/02/21 0129  WBC 12.0* 10.2 10.8* 10.2 7.6 15.1*  NEUTROABS 9.6* 8.1*  --   --   --   --   HGB 8.2* 7.5* 9.0* 9.3* 8.1* 8.6*  HCT 25.2* 22.5* 26.6* 28.2* 25.5* 27.0*  MCV 105.4* 106.6* 102.3* 102.9* 106.3* 106.3*  PLT 182 165 146* 143* 126* 149*   Cardiac Enzymes: No results for input(s): CKTOTAL, CKMB, CKMBINDEX, TROPONINI in the last 168 hours. CBG: No results for input(s): GLUCAP in the last 168 hours.  Studies/Results: No results found. Medications:  dialysis solution 1.5% low-MG/low-CA     piperacillin-tazobactam (ZOSYN)  IV 2.25 g (12/02/21 0903)    calcitonin ([place])  1 spray Alternating Nares Daily   gentamicin cream  1 application Topical Daily   magnesium oxide  800 mg  Oral BID   metoprolol succinate  12.5 mg Oral Daily   midodrine  20 mg Oral TID WC   pantoprazole  40 mg Oral BID AC   polyethylene glycol  17 g Oral Daily   sevelamer carbonate  800 mg Oral TID WC   vancomycin variable dose per unstable renal function (pharmacist dosing)   Does not apply See admin instructions

## 2021-12-02 NOTE — Consult Note (Signed)
Consultation Note Date: 12/02/2021   Patient Name: David Barton  DOB: 1933/09/13  MRN: [id number]  Age / Sex: 87 y.o., male  PCP: David Noon, MD Referring Physician: Albertine Patricia, MD  Reason for Consultation: Establishing goals of care  HPI/Patient Profile: 87 y.o. male  with past medical history of COPD, a fib, HFrEF, colon cancer, c diff, htn, and ESRD on PD admitted on 11/27/2021 with multilobar pna - failed outpatient treatment. Concern for postobstructive pna - had bronch 12/12, concerning for malignancy, awaiting biopsies. Continues on PD with no complications. Has some hypotension. Also with L1 fracture.  PMT consulted for David Barton.   Clinical Assessment and Goals of Care: I have reviewed medical records including EPIC notes, labs and imaging, assessed the patient and then met with patient  to discuss diagnosis prognosis, GOC, EOL wishes, disposition and options.  I introduced Palliative Medicine as specialized medical care for people living with serious illness. It focuses on providing relief from the symptoms and stress of a serious illness. The goal is to improve quality of life for both the patient and the family.  We discussed a brief life review of the patient. He tells me he is married, has 2 kids, and grand kids.   As far as functional and nutritional status, he tells me he was doing well prior to 1 month ago. He was driving and able to care for himself. For the past month he has experienced a decline r/t respiratory status. His appetite has also declined.    We discussed patient's current illness and what it means in the larger context of patient's on-going co-morbidities.  Natural disease trajectory and expectations at EOL were discussed. He tells me he understands concern about cancer - he was disappointed to hear this but not shocked. He understands we are waiting biopsy results. We also discuss his respiratory failure  - he tells about his frustration with CPAP and tells me he does not want to wear it anymore - not worth it to him.   I attempted to elicit values and goals of care important to the patient.    The difference between aggressive medical intervention and comfort care was considered in light of the patient's goals of care.   David Barton tells me about his brother who died 5 years ago with lung cancer and his experience with it - he is unsure what treatment his brother had but shares that he did not have bad side effects.   He tells me he is interested in learning more about a cancer diagnosis if he has it and what treatment options would be available. He tells me if his only options are for chemo with side effects of severe nausea then he is not interested in proceeding. However if other options are available to him that are generally well tolerated he would be interesting in exploring them. He does clarify that if his prognosis is ~months then he does not want to pursue any sort of treatment even if it is well tolerated - he would prefer to go home with hospice. If his prognosis is a year or more then he would be willing to investigate treatment options further. He would like to discuss options with oncology.   He tells me he has discussed discharge options with his wife and he really does not want to go to a rehab facility. I ask him if he feels he has enough care at home and he tells me his wife cannot  care for him 24/7. He feels that his functional status is improving and he is hoping for more improvement so that he can go home with home health vs hospice.   We discuss dialysis - again he shares if his prognosis is poor he would opt to discontinue dialysis and go home with hospice - we discussed time would be < 2 weeks without dialysis.   Discussed with patient the importance of continued conversation with family and the medical providers regarding overall plan of care and treatment options, ensuring  decisions are within the context of the patients values and GOCs.    I offered to call patient's wife but he tells me that he has shared everything he has shared with me with her and there is no need.   Patient shares that his main goal is to be home by Christmas - this is what he feels is most important right now.   Questions and concerns were addressed. The patient was encouraged to call with questions or concerns.    Primary Decision Maker PATIENT    SUMMARY OF RECOMMENDATIONS   - patient awaiting biopsy results - he would like to hear about results/potential treatment options prior to making decisions - if limited options and poor prognosis he would opt to dc dialysis and go home with hospice, if prognosis is better and there are treatment options that are generally well tolerated he would be interested in hearing about these - he does not want to go to rehab - he is hopeful he can go home with home health vs hospice - patient's most important goal to him at this point is to be home by Christmas - patient does not want to wear CPAP anymore  Code Status/Advance Care Planning: DNR  Prognosis:  Unable to determine  Discharge Planning: To Be Determined      Primary Diagnoses: Present on Admission:  Multifocal pneumonia  Chronic diastolic heart failure (David Barton)  Longstanding persistent atrial fibrillation ([name])  Essential hypertension  Chronic respiratory failure with hypoxia and hypercapnia ([name])  ESRD (end stage renal disease) (David Barton)  DNR (do not resuscitate)  Hypotension   I have reviewed the medical record, interviewed the patient and family, and examined the patient. The following aspects are pertinent.  Past Medical History:  Diagnosis Date   Alcohol abuse    Colon cancer (David Barton)    COPD (chronic obstructive pulmonary disease) ([name])    Emphysema of lung ([name])    Enteritis due to Clostridium difficile    ESRD (end stage renal disease) (David Barton)    Former tobacco use     Hypertension    Peripheral edema    Sepsis (David Barton) 10/2016   Aspiration PNA/C diff colitis   Social History   Socioeconomic History   Marital status: Married    Spouse name: Not on file   Number of children: 3   Years of education: Not on file   Highest education level: Not on file  Occupational History   Occupation: Retired- Associate Professor  Tobacco Use   Smoking status: Former    Packs/day: 1.00    Years: 20.00    Pack years: 20.00    Types: Cigarettes    Quit date: 12/20/1961    Years since quitting: 59.9   Smokeless tobacco: Never  Vaping Use   Vaping Use: Never used  Substance and Sexual Activity   Alcohol use: Yes    Alcohol/week: 7.0 standard drinks    Types: 7 Standard drinks or equivalent  per week    Comment: states he drinks 3-4 days a week, "a couple" on those occasions but does not further quaniity.   Drug use: No   Sexual activity: Never  Other Topics Concern   Not on file  Social History Narrative   Not on file   Social Determinants of Health   Financial Resource Strain: Not on file  Food Insecurity: Not on file  Transportation Needs: Not on file  Physical Activity: Not on file  Stress: Not on file  Social Connections: Not on file   Family History  Problem Relation Age of Onset   Brain cancer Father    Colon cancer Mother        with mets to liver   Lung cancer Brother        was a smoker   Scheduled Meds:  calcitonin (salmon)  1 spray Alternating Nares Daily   gentamicin cream  1 application Topical Daily   magnesium oxide  800 mg Oral BID   metoprolol succinate  12.5 mg Oral Daily   midodrine  20 mg Oral TID WC   pantoprazole  40 mg Oral BID AC   polyethylene glycol  17 g Oral Daily   sevelamer carbonate  800 mg Oral TID WC   vancomycin variable dose per unstable renal function (pharmacist dosing)   Does not apply See admin instructions   Continuous Infusions:  dialysis solution 1.5% low-MG/low-CA     piperacillin-tazobactam  (ZOSYN)  IV 2.25 g (12/02/21 0903)   vancomycin 750 mg (12/02/21 1106)   PRN Meds:.acetaminophen **OR** acetaminophen, heparin, ipratropium-albuterol, midodrine, ondansetron **OR** ondansetron (ZOFRAN) IV, oxyCODONE Allergies  Allergen Reactions   Amlodipine Swelling   Tizanidine Other (See Comments)    Other reaction(s): hypotension   Review of Systems  Constitutional:  Positive for activity change, appetite change and fatigue.  Respiratory:  Positive for shortness of breath and wheezing.    Physical Exam Constitutional:      General: He is not in acute distress. Pulmonary:     Effort: Pulmonary effort is normal.  Skin:    General: Skin is warm and dry.  Neurological:     Mental Status: He is alert and oriented to person, place, and time.    Vital Signs: BP (!) 96/42 (BP Location: Left Wrist)    Pulse 90    Temp 97.7 F (36.5 C) (Oral)    Resp 20    Ht _0  (1.778 m)    Wt 88.5 kg    SpO2 92%    BMI 27.99 kg/m  Pain Scale: 0-10 POSS *See Group Information*: 1-Acceptable,Awake and alert Pain Score: 0-No pain   SpO2: SpO2: 92 % O2 Device:SpO2: 92 % O2 Flow Rate: .O2 Flow Rate (L/min): 4 L/min  IO: Intake/output summary:  Intake/Output Summary (Last 24 hours) at 12/02/2021 1124 Last data filed at 12/02/2021 0924 Gross per 24 hour  Intake 18120 ml  Output 17978 ml  Net 142 ml    LBM: Last BM Date: 11/29/21 Baseline Weight: Weight: 84.4 kg Most recent weight: Weight: 88.5 kg     Palliative Assessment/Data: PPS 50%     Time Total: 85 minutes Greater than 50%  of this time was spent counseling and coordinating care related to the above assessment and plan.  Juel Burrow, DNP, AGNP-C Palliative Medicine Team [phone] Pager: [phone]

## 2021-12-02 NOTE — Progress Notes (Signed)
Pharmacy Antibiotic Note- Follow-up  David Barton is a 87 y.o. male admitted on 11/27/2021 with pneumonia.  Pharmacy has been consulted for vancomycin dosing.   Height: 5\' 10"  (177.8 cm) Weight: 88.5 kg (195 lb 1.7 oz) IBW/kg (Calculated) : 73  Temp (24hrs), Avg:97.7 F (36.5 C), Min:97.5 F (36.4 C), Max:97.8 F (36.6 C)  Recent Labs  Lab 11/27/21 1828 11/28/21 0441 11/29/21 0631 11/29/21 2109 11/30/21 0346 12/01/21 0133 12/01/21 1547 12/02/21 0129  WBC 12.0* 10.2 10.8*  --  10.2 7.6  --  15.1*  CREATININE 7.24* 7.99* 7.67* 8.13* 7.89* 7.23*  --  6.63*  LATICACIDVEN 1.3 2.3*  --   --   --   --   --   --   VANCORANDOM  --   --  20  --   --   --  21  --     Estimated Creatinine Clearance: 9.1 mL/min (A) (by C-G formula based on SCr of 6.63 mg/dL (H)).    Allergies  Allergen Reactions   Amlodipine Swelling   Tizanidine Other (See Comments)    Other reaction(s): hypotension    Last vanco random was 21 mcg/mL on 12/01/2021 at 15:47.  Administer vanco 750 mg iv x1 dose at 10:00 and re-draw a vanco random level with AM labs on 12/16  Thank you for allowing pharmacy to be a part of this patients care.  Vaughan Basta BS, PharmD, BCPS Clinical Pharmacist 12/02/2021 8:39 AM

## 2021-12-02 NOTE — Progress Notes (Signed)
This is a progress note    NAME:  David Barton, MRN:  [id number], DOB:  Jul 05, 1933, LOS: 4 ADMISSION DATE:  11/27/2021, CONSULTATION DATE: 09/30/2021 REFERRING MD: [facility], CHIEF COMPLAINT: Back pain  History of Present Illness:  87 year old male former smoker patient of Dr. Christinia Gully who has ongoing COPD, emphysema O2 dependent at 3 to 4 L nasal cannula.  Constant chronic cough he has been worked up for dysphagia in the past swallowing evaluation without problem.  He is on peritoneal dialysis.  Reports that his weight is constant at approximately 87 kg.  Denies fevers chills sweats or productive cough.  He did have a thoracentesis done in October 2022.  His chief complaint was back pain.  He does have an L2 compression fracture.  CT evidence reveals left-sided pneumonia he is being evaluated for fiberoptic bronchoscopy on 09/30/2021 for visualization and clearing of suspected mucoid impaction.  There is some question of malignancy this will also be addressed at that time.  Pertinent  Medical History   Past Medical History:  Diagnosis Date   Alcohol abuse    Colon cancer ([place])    COPD (chronic obstructive pulmonary disease) ([place])    Emphysema of lung ([place])    Enteritis due to Clostridium difficile    ESRD (end stage renal disease) ([place])    Former tobacco use    Hypertension    Peripheral edema    Sepsis ([place]) 10/2016   Aspiration PNA/C diff colitis     Significant Hospital Events: Including procedures, antibiotic start and stop dates in addition to other pertinent events   11/28/2021 pulmonary consult plan for fiberoptic bronchoscopy 11/30/2021  Interim History / Subjective:  No acute events overnight. Patient in step down unit for closer monitoring of BP after bronch yesterday.   He denies any hemoptysis. No chest pain.   Objective   Blood pressure (!) 91/58, pulse 96, temperature (!) 97.3 F (36.3 C), temperature source Temporal, resp. rate 20, height 5\' 10"  (1.778 m),  weight 88.5 kg, SpO2 93 %.        Intake/Output Summary (Last 24 hours) at 12/02/2021 1801 Last data filed at 12/02/2021 1500 Gross per 24 hour  Intake 18220 ml  Output 17978 ml  Net 242 ml   Filed Weights   11/30/21 1930 12/01/21 1726 12/02/21 0500  Weight: 83.9 kg 78.9 kg 88.5 kg    Examination: General: no acute distress, elderly male HENT: No JVD or lymphadenopathy appreciated Lungs: diminished breath sounds on right Cardiovascular: Heart sounds are distant Abdomen: Soft nontender peritoneal catheter in place Extremities: Mild edema Neuro: Grossly intact without focal defect  Resolved Hospital Problem list     Assessment & Plan:  Acute on chronic hypoxic respiratory failure Gold B COPD, emphysema persistent pleural effusion, status post thoracentesis in September 28, 2021.  Multiple episodes of pneumonia recently treated with Augmentin.   Adenocarcinoma of the Lung, s/p bronch 12/12 - s/p bronchoscopy with EBUS and TBNA of stations 4R and 7 with diagnosis of adenocarcinoma of the lung - Oncology consult placed by primary team - will follow up pathology results. - Recommend palliative care consult - abx as ordered -nebs  End-stage renal disease Continue peritoneal dialysis Continue midodrine  Compression fractures at L1 Pain relief as needed Further outpatient evaluation  History of atrial fibrillation AC on hold   PCCM to sign off. Please arrange hospital follow up when nearing discharge.   Freda Jackson, MD [facility] Office: [phone]  See Amion for personal pager PCCM on call pager [phone] until 7pm. Please call Elink 7p-7a. [phone]

## 2021-12-02 NOTE — Progress Notes (Signed)
°   12/02/21 0720  Peritoneal Catheter  Placement Date: 11/30/21    Site Assessment Clean;Dry;Intact  Drainage Description None  Catheter status Accessed  Dressing Gauze/Drain sponge  Dressing Status Clean;Dry;Intact  Completion  Treatment Status Complete  Fluid Balance - CCPD  Total Intake for Exchanges (mL) 18000 ml  Total Output for Exchanges (mL) 17978 ml  Procedure Comments  Tolerated treatment well? Yes  Peritoneal Dialysis Comments NET UF -206  Education / Care Plan  Dialysis Education Provided Yes   Pt with NET UF (-206) liters during PD, He was wheezing on expiratory and reported this to be normal for him.  His O2 saturations were 89%. Pt expressed his bp are usually low for him. Ending BP 96/42.

## 2021-12-02 NOTE — Progress Notes (Signed)
PROGRESS NOTE   David Barton  FXT:[id number] DOB: 11-25-1933 DOA: 11/27/2021 PCP: Chesley Noon, MD  Brief Narrative:   87 year old white male home dwelling known COPD emphysema respiratory failure 4 L oxygen for the past month-prior Pleural effusions 2020 Permanent A. 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 difficile 2017 OSA Seen in pulmonary office 11/27/2021 after being treated on 11/25 with Augmentin and steroids-he completed those but experienced worsening once again-x-ray in office showed right upper lobe airspace disease?  Multi lobar pneumonia patient was referred to ED On work-up found to have 25% L1 compression fracture Also found to have hemoglobin down from 10-8.2 with guaiac positive stools  Hospital-Problem based course   Recurrent pneumonia, failed outpatient Augmentin  -Concern for postobstructive pneumonia due to malignancy with loculated effusion  -PCCM input greatly appreciated, continue with IV vancomycin and Zosyn  -S/p bronchoscopy 12/12, some findings concerning for malignancy, await on biopsy .  Diarrhea-resolved Last BM unformed 2 to 3 days back,  wife indicates started after abx--not bloody Eliquis 2.5 twice daily from prior to admission held Epistaxis on 11/26 Acute blood loss anemia Profuse epistaxis Thanksgiving evening ~ 8 hour Transfuse 1 U 12/10-hemoglobin in the 8-9 range-transfusion threshold is below 7 ESRD on peritoneal dialysis hypokalemia Replaced and now resolved Wght seems up 3 kg Rest as per renal in terms of dialysis ,peritoneal dialysate Increased midodrine 12/12 to 20 tid  Impaired glycemic control Recent steroid use Get A1c, but if majority of sugars below 180, probably no need for coverage Permanent A. fib CHADS2 score >4 previously on Xarelto Sinus/Afib on monitor-not on any rate controlling agent, 2/2 prior low blood  pressures Started metoprolol xl 12.5--keep map above 65 Xarelto on hold for now L1 fracture 25% Mobilize with PT/OT Not a current candidate for Kypho given peri-op issues hypoxia etc.--trial nasal calcitonin Continue oxycodone ir 5 every 4 as needed moderate pain Tylenol first choice  DVT prophylaxis: scd Code Status: full Family Communication: none at bedside Disposition:  Status is: Inpatient  Remains inpatient appropriate because: need for work up He will need SNF at discharge vs 24/7 care  Consultants:  Pulm palliaitve  Procedures:   Antimicrobials: vanc zosyn from admit    Subjective:  Awake coherent to several questions about his dialysate--seems a little frustrated at the timing of this Overall feels shortness of breath is better   Objective: Vitals:   12/02/21 0018 12/02/21 0425 12/02/21 0500 12/02/21 0736  BP: (!) 106/95 (!) 88/44  (!) 96/42  Pulse: 76 80  90  Resp: 15 16  20   Temp: 97.8 F (36.6 C) 97.7 F (36.5 C)  97.7 F (36.5 C)  TempSrc: Oral Oral  Oral  SpO2: 94% 91%  92%  Weight:   88.5 kg   Height:        Intake/Output Summary (Last 24 hours) at 12/02/2021 1216 Last data filed at 12/02/2021 0924 Gross per 24 hour  Intake 18120 ml  Output 17978 ml  Net 142 ml   Filed Weights   11/30/21 1930 12/01/21 1726 12/02/21 0500  Weight: 83.9 kg 78.9 kg 88.5 kg    Examination:  Awake Alert, Oriented X 3, frail, significantly deconditioned  no new F.N deficits, Normal affect Symmetrical Chest wall movement, air entry at the bases, R>L RRR,No Gallops,Rubs or new Murmurs, No Parasternal Heave +ve B.Sounds, Abd Soft, No tenderness, No rebound -  guarding or rigidity. No Cyanosis, Clubbing or edema, No new Rash or bruise     Data Reviewed: personally reviewed   CBC    Component Value Date/Time   WBC 15.1 (H) 12/02/2021 0129   RBC 2.54 (L) 12/02/2021 0129   HGB 8.6 (L) 12/02/2021 0129   HGB 14.1 12/21/2018 1038   HGB 14.9 11/24/2009 0958    HCT 27.0 (L) 12/02/2021 0129   HCT 30.1 (L) 06/09/2019 1329   HCT 43.8 11/24/2009 0958   PLT 149 (L) 12/02/2021 0129   PLT 276 12/21/2018 1038   MCV 106.3 (H) 12/02/2021 0129   MCV 101 (H) 12/21/2018 1038   MCV 108.6 (H) 11/24/2009 0958   MCH 33.9 12/02/2021 0129   MCHC 31.9 12/02/2021 0129   RDW 19.0 (H) 12/02/2021 0129   RDW 12.5 12/21/2018 1038   RDW 15.4 (H) 11/24/2009 0958   LYMPHSABS 0.8 11/28/2021 0441   LYMPHSABS 2.2 11/24/2009 0958   MONOABS 1.0 11/28/2021 0441   MONOABS 0.6 11/24/2009 0958   EOSABS 0.1 11/28/2021 0441   EOSABS 0.2 11/24/2009 0958   BASOSABS 0.0 11/28/2021 0441   BASOSABS 0.0 11/24/2009 0958   CMP Latest Ref Rng & Units 12/02/2021 12/01/2021 11/30/2021  Glucose 70 - 99 mg/dL 147(H) 213(H) 108(H)  BUN 8 - 23 mg/dL 53(H) 51(H) 54(H)  Creatinine 0.61 - 1.24 mg/dL 6.63(H) 7.23(H) 7.89(H)  Sodium 135 - 145 mmol/L 133(L) 133(L) 134(L)  Potassium 3.5 - 5.1 mmol/L 3.5 3.7 3.8  Chloride 98 - 111 mmol/L 96(L) 97(L) 95(L)  CO2 22 - 32 mmol/L 23 21(L) 26  Calcium 8.9 - 10.3 mg/dL 7.7(L) 7.6(L) 7.9(L)  Total Protein 6.5 - 8.1 g/dL 4.9(L) - -  Total Bilirubin 0.3 - 1.2 mg/dL 0.8 - -  Alkaline Phos 38 - 126 U/L 73 - -  AST 15 - 41 U/L 20 - -  ALT 0 - 44 U/L 11 - -     Radiology Studies: No results found.   Scheduled Meds:  calcitonin (salmon)  1 spray Alternating Nares Daily   gentamicin cream  1 application Topical Daily   magnesium oxide  800 mg Oral BID   metoprolol succinate  12.5 mg Oral Daily   midodrine  20 mg Oral TID WC   pantoprazole  40 mg Oral BID AC   polyethylene glycol  17 g Oral Daily   sevelamer carbonate  800 mg Oral TID WC   vancomycin variable dose per unstable renal function (pharmacist dosing)   Does not apply See admin instructions   Continuous Infusions:  dialysis solution 1.5% low-MG/low-CA     piperacillin-tazobactam (ZOSYN)  IV 2.25 g (12/02/21 0903)     LOS: 4 days    Phillips Climes, MD [facility] To  contact the attending provider between 7A-7P or the covering provider during after hours 7P-7A, please log into the web site [email or web address] and access using universal [place] password for that web site. If you do not have the password, please call the hospital operator.  12/02/2021, 12:16 PM

## 2021-12-02 NOTE — Progress Notes (Signed)
Occupational Therapy Treatment Patient Details Name: David Barton MRN: [id number] DOB: [birth year] Today's Date: 12/02/2021   History of present illness 87 year old male  presents to the ER 11/28/21 from the pulmonary office.  Patient was sent to the ER due to pneumonia seen on chest x-ray. hypotensive; compression fx L1;   PMH-- end-stage renal disease on peritoneal dialysis, COPD, recurrent pneumonia, A. fib on anticoagulation, colon Ca, HTN   OT comments  Pt seen this session to work on bed mobility, spinal precautions, and transfers. He presented with increased activity tolerance and strength this session, requiring decreased assist. Pt motivated to get OOB this session. He continues to resist spinal precautions and log rolling for bed mobility, reporting that he will be fine without them. OT will continue to follow acutely and SNF remains appropriate.    Recommendations for follow up therapy are one component of a multi-disciplinary discharge planning process, led by the attending physician.  Recommendations may be updated based on patient status, additional functional criteria and insurance authorization.    Follow Up Recommendations  Skilled nursing-short term rehab (<3 hours/day)    Assistance Recommended at Discharge Frequent or constant Supervision/Assistance  Equipment Recommendations  Other (comment)    Recommendations for Other Services      Precautions / Restrictions Precautions Precautions: Fall;Back Precaution Comments: Reviewed back precautions and log roll technique; pt refuses to logroll Restrictions Weight Bearing Restrictions: No       Mobility Bed Mobility Overal bed mobility: Needs Assistance Bed Mobility: Supine to Sit     Supine to sit: Mod assist;+2 for physical assistance;HOB elevated Sit to supine: Min guard   General bed mobility comments: Pt continuing to refuse to follow spinal precautions.    Transfers Overall transfer level: Needs  assistance Equipment used: 1 person hand held assist Transfers: Sit to/from Stand Sit to Stand: Min assist;+2 safety/equipment           General transfer comment: Min A to power up and steady     Balance Overall balance assessment: Needs assistance Sitting-balance support: No upper extremity supported;Feet supported Sitting balance-Leahy Scale: Fair     Standing balance support: Bilateral upper extremity supported;Reliant on assistive device for balance Standing balance-Leahy Scale: Poor                             ADL either performed or assessed with clinical judgement   ADL Overall ADL's : Needs assistance/impaired                 Upper Body Dressing : Set up;Sitting       Toilet Transfer: Minimal assistance;Stand-pivot Toilet Transfer Details (indicate cue type and reason): simulated with reclinerin room. Toileting- Clothing Manipulation and Hygiene: Minimal assistance;Cueing for back precautions;Sitting/lateral lean;Sit to/from stand Toileting - Clothing Manipulation Details (indicate cue type and reason): Pt with difficulty maintaining precautiosn     Functional mobility during ADLs: Minimal assistance General ADL Comments: PT requiring set up to min A this session for BAsic ADL's.    Extremity/Trunk Assessment              Vision       Perception     Praxis      Cognition Arousal/Alertness: Awake/alert Behavior During Therapy: WFL for tasks assessed/performed Overall Cognitive Status: Within Functional Limits for tasks assessed  Exercises     Shoulder Instructions       General Comments      Pertinent Vitals/ Pain       Pain Assessment: Faces Faces Pain Scale: Hurts little more Pain Location: Back Pain Descriptors / Indicators: Aching;Discomfort;Grimacing Pain Intervention(s): Monitored during session;Repositioned  Home Living                                           Prior Functioning/Environment              Frequency  Min 2X/week        Progress Toward Goals  OT Goals(current goals can now be found in the care plan section)     Acute Rehab OT Goals Patient Stated Goal: To move better OT Goal Formulation: With patient Time For Goal Achievement: 12/13/21 Potential to Achieve Goals: Fair ADL Goals Pt Will Perform Grooming: with modified independence;sitting Pt Will Perform Lower Body Bathing: with modified independence;sitting/lateral leans;sit to/from stand Pt Will Perform Lower Body Dressing: with modified independence;sitting/lateral leans;sit to/from stand Pt Will Transfer to Toilet: with modified independence;ambulating Pt Will Perform Toileting - Clothing Manipulation and hygiene: with modified independence;sitting/lateral leans;sit to/from stand Additional ADL Goal #1: Pt recall 3/3 spinal precautions  Plan Discharge plan remains appropriate;Frequency remains appropriate    Co-evaluation                 AM-PAC OT "6 Clicks" Daily Activity     Outcome Measure   Help from another person eating meals?: A Little Help from another person taking care of personal grooming?: A Little Help from another person toileting, which includes using toliet, bedpan, or urinal?: A Lot Help from another person bathing (including washing, rinsing, drying)?: A Lot Help from another person to put on and taking off regular upper body clothing?: A Little Help from another person to put on and taking off regular lower body clothing?: A Lot 6 Click Score: 15    End of Session Equipment Utilized During Treatment: Gait belt;Oxygen  OT Visit Diagnosis: Unsteadiness on feet (R26.81);Other abnormalities of gait and mobility (R26.89);Muscle weakness (generalized) (M62.81)   Activity Tolerance Patient tolerated treatment well   Patient Left in chair;with call bell/phone within reach;with chair alarm set   Nurse  Communication Mobility status        Time: 9485-4627 OT Time Calculation (min): 23 min  Charges: OT Treatments $Self Care/Home Management : 8-22 mins $Therapeutic Activity: 8-22 mins  Tameyah Koch H., OTR/L Acute Rehabilitation  David Barton David Barton 12/02/2021, 2:36 PM

## 2021-12-03 ENCOUNTER — Encounter (HOSPITAL_COMMUNITY): Payer: Self-pay | Admitting: Internal Medicine

## 2021-12-03 DIAGNOSIS — C3490 Malignant neoplasm of unspecified part of unspecified bronchus or lung: Secondary | ICD-10-CM

## 2021-12-03 LAB — CBC
HCT: 26.4 % — ABNORMAL LOW (ref 39.0–52.0)
Hemoglobin: 8.6 g/dL — ABNORMAL LOW (ref 13.0–17.0)
MCH: 34.3 pg — ABNORMAL HIGH (ref 26.0–34.0)
MCHC: 32.6 g/dL (ref 30.0–36.0)
MCV: 105.2 fL — ABNORMAL HIGH (ref 80.0–100.0)
Platelets: 144 K/uL — ABNORMAL LOW (ref 150–400)
RBC: 2.51 MIL/uL — ABNORMAL LOW (ref 4.22–5.81)
RDW: 19.1 % — ABNORMAL HIGH (ref 11.5–15.5)
WBC: 12.8 K/uL — ABNORMAL HIGH (ref 4.0–10.5)
nRBC: 0 % (ref 0.0–0.2)

## 2021-12-03 LAB — RENAL FUNCTION PANEL
Albumin: 1.8 g/dL — ABNORMAL LOW (ref 3.5–5.0)
Anion gap: 16 — ABNORMAL HIGH (ref 5–15)
BUN: 52 mg/dL — ABNORMAL HIGH (ref 8–23)
CO2: 24 mmol/L (ref 22–32)
Calcium: 7.7 mg/dL — ABNORMAL LOW (ref 8.9–10.3)
Chloride: 94 mmol/L — ABNORMAL LOW (ref 98–111)
Creatinine, Ser: 6.71 mg/dL — ABNORMAL HIGH (ref 0.61–1.24)
GFR, Estimated: 8 mL/min — ABNORMAL LOW
Glucose, Bld: 129 mg/dL — ABNORMAL HIGH (ref 70–99)
Phosphorus: 5.7 mg/dL — ABNORMAL HIGH (ref 2.5–4.6)
Potassium: 3.3 mmol/L — ABNORMAL LOW (ref 3.5–5.1)
Sodium: 134 mmol/L — ABNORMAL LOW (ref 135–145)

## 2021-12-03 LAB — MAGNESIUM: Magnesium: 2 mg/dL (ref 1.7–2.4)

## 2021-12-03 MED ORDER — APIXABAN 2.5 MG PO TABS
2.5000 mg | ORAL_TABLET | Freq: Two times a day (BID) | ORAL | Status: DC
  Administered 2021-12-03 – 2021-12-04 (×2): 2.5 mg via ORAL
  Filled 2021-12-03 (×2): qty 1

## 2021-12-03 MED ORDER — POTASSIUM CHLORIDE CRYS ER 20 MEQ PO TBCR
20.0000 meq | EXTENDED_RELEASE_TABLET | Freq: Two times a day (BID) | ORAL | Status: AC
  Administered 2021-12-03 (×2): 20 meq via ORAL
  Filled 2021-12-03 (×2): qty 1

## 2021-12-03 NOTE — Progress Notes (Signed)
PT Cancellation Note  Patient Details Name: JAKOBY MELENDREZ MRN: [id number] DOB: 1933-02-07   Cancelled Treatment:    Reason Eval/Treat Not Completed: Patient at procedure or test/unavailable. PT attempted x 2. Pt on PD both attempts.   Lorriane Shire 12/03/2021, 10:58 AM  Lorrin Goodell, PT  Office # [phone] Pager [phone]

## 2021-12-03 NOTE — Progress Notes (Signed)
PROGRESS NOTE   David Barton  ZOX:[id number] DOB: [birth year] DOA: 11/27/2021 PCP: Chesley Noon, MD   Brief Narrative:   87 year old white male home dwelling known COPD emphysema respiratory failure 4 L oxygen for the past month-prior Pleural effusions 2020 Permanent A. 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 difficile 2017 OSA Seen in pulmonary office 11/27/2021 after being treated on 11/25 with Augmentin and steroids-he completed those but experienced worsening once again-x-ray in office showed right upper lobe airspace disease?  Multi lobar pneumonia patient was referred to ED On work-up found to have 25% L1 compression fracture Also found to have hemoglobin down from 10-8.2 with guaiac positive stools  Hospital-Problem based course   Recurrent pneumonia, failed outpatient Augmentin  - This is most likely due to postobstructive pneumonia from lung malignancy -Treated with IV vancomycin and Zosyn.  Adenocarcinoma of the lung -S/p bronchoscopy 12/12, with a EBUS and TBNA and lymph node biopsy, biopsy confirms adenocarcinoma. -Oncology to evaluate patient today, discussed with wife who will be here after 3 is with oncology as well. -Palliative consulted as well.  Diarrhea -resolved  Epistaxis on 11/26 Acute blood loss anemia Profuse epistaxis Thanksgiving evening ~ 8 hour Transfuse 1 U 12/10-hemoglobin in the 8-9 range-transfusion threshold is below 7  ESRD  -on peritoneal dialysis  Hypokalemia -Replete as needed  Impaired glycemic control Recent steroid use Get A1c, but if majority of sugars below 180, probably no need for coverage  Permanent A. fib CHADS2 score >4 previously on Xarelto Sinus/Afib on monitor-not on any rate controlling agent, 2/2 prior low blood pressures Started metoprolol xl 12.5--keep map above 65 Eliquis on hold for now ,will resume   L1  fracture 25% Mobilize with PT/OT Not a current candidate for Kypho given peri-op issues hypoxia etc.--trial nasal calcitonin Continue oxycodone ir 5 every 4 as needed moderate pain Tylenol first choice  DVT prophylaxis: scd, Eliquis Code Status: full Family Communication: none at bedside Disposition:  Status is: Inpatient  Remains inpatient appropriate because: need for work up He will need SNF at discharge vs 24/7 care  Consultants:  Pulm Palliaitve oncology  Procedures:   Antimicrobials: vanc zosyn from admit    Subjective:  Seems to be frustrated overnight due to dialysis machine trouble overnight which kept him awake.   Objective: Vitals:   12/03/21 0802 12/03/21 0830 12/03/21 0937 12/03/21 1128  BP: (!) 80/46 (!) 81/51  (!) 87/52  Pulse: 87 98  95  Resp: 14 16  16   Temp:  97.6 F (36.4 C)  97.7 F (36.5 C)  TempSrc:  Oral  Axillary  SpO2: 92% 92% 92% 91%  Weight:      Height:        Intake/Output Summary (Last 24 hours) at 12/03/2021 1400 Last data filed at 12/03/2021 1132 Gross per 24 hour  Intake 18758.97 ml  Output --  Net 18758.97 ml   Filed Weights   12/01/21 1726 12/02/21 0500 12/03/21 0500  Weight: 78.9 kg 88.5 kg 87 kg    Examination:  Awake Alert, Oriented X 3, frail, chronically ill-appearing , no new F.N deficits, Normal affect Symmetrical Chest wall movement, Minister entry at the bases RRR,No Gallops,Rubs or new Murmurs, No Parasternal Heave +ve B.Sounds, Abd Soft, No tenderness, No rebound - guarding or rigidity. No Cyanosis, Clubbing or edema, No new Rash or bruise      Data Reviewed:  personally reviewed   CBC    Component Value Date/Time   WBC 12.8 (H) 12/03/2021 0124   RBC 2.51 (L) 12/03/2021 0124   HGB 8.6 (L) 12/03/2021 0124   HGB 14.1 12/21/2018 1038   HGB 14.9 11/24/2009 0958   HCT 26.4 (L) 12/03/2021 0124   HCT 30.1 (L) 06/09/2019 1329   HCT 43.8 11/24/2009 0958   PLT 144 (L) 12/03/2021 0124   PLT 276  12/21/2018 1038   MCV 105.2 (H) 12/03/2021 0124   MCV 101 (H) 12/21/2018 1038   MCV 108.6 (H) 11/24/2009 0958   MCH 34.3 (H) 12/03/2021 0124   MCHC 32.6 12/03/2021 0124   RDW 19.1 (H) 12/03/2021 0124   RDW 12.5 12/21/2018 1038   RDW 15.4 (H) 11/24/2009 0958   LYMPHSABS 0.8 11/28/2021 0441   LYMPHSABS 2.2 11/24/2009 0958   MONOABS 1.0 11/28/2021 0441   MONOABS 0.6 11/24/2009 0958   EOSABS 0.1 11/28/2021 0441   EOSABS 0.2 11/24/2009 0958   BASOSABS 0.0 11/28/2021 0441   BASOSABS 0.0 11/24/2009 0958   CMP Latest Ref Rng & Units 12/03/2021 12/02/2021 12/01/2021  Glucose 70 - 99 mg/dL 129(H) 147(H) 213(H)  BUN 8 - 23 mg/dL 52(H) 53(H) 51(H)  Creatinine 0.61 - 1.24 mg/dL 6.71(H) 6.63(H) 7.23(H)  Sodium 135 - 145 mmol/L 134(L) 133(L) 133(L)  Potassium 3.5 - 5.1 mmol/L 3.3(L) 3.5 3.7  Chloride 98 - 111 mmol/L 94(L) 96(L) 97(L)  CO2 22 - 32 mmol/L 24 23 21(L)  Calcium 8.9 - 10.3 mg/dL 7.7(L) 7.7(L) 7.6(L)  Total Protein 6.5 - 8.1 g/dL - 4.9(L) -  Total Bilirubin 0.3 - 1.2 mg/dL - 0.8 -  Alkaline Phos 38 - 126 U/L - 73 -  AST 15 - 41 U/L - 20 -  ALT 0 - 44 U/L - 11 -     Radiology Studies: DG Chest 2 View  Result Date: 12/02/2021 CLINICAL DATA:  History of COPD EXAM: CHEST - 2 VIEW COMPARISON:  Chest x-ray 11/27/2021, CT 11/28/2021 FINDINGS: Emphysematous disease. Interval complete opacification of the right thorax, some shift of mediastinal contents to the right. Bilateral pleural effusions. Cardiomediastinal silhouette is obscured. No pneumothorax. IMPRESSION: 1. Interval complete opacification of the right thorax which may be due to combination of large pleural effusion and underlying airspace disease. Some shift of mediastinal contents to the right suggests a component of volume loss/atelectasis. 2. Small left pleural effusion 3. Emphysema Electronically Signed   By: Donavan Foil M.D.   On: 12/02/2021 16:31     Scheduled Meds:  arformoterol  15 mcg Nebulization BID   calcitonin  (salmon)  1 spray Alternating Nares Daily   gentamicin cream  1 application Topical Daily   magnesium oxide  800 mg Oral BID   metoprolol succinate  12.5 mg Oral Daily   midodrine  20 mg Oral TID WC   pantoprazole  40 mg Oral BID AC   polyethylene glycol  17 g Oral Daily   potassium chloride  20 mEq Oral BID   revefenacin  175 mcg Nebulization Daily   sevelamer carbonate  800 mg Oral TID WC   vancomycin variable dose per unstable renal function (pharmacist dosing)   Does not apply See admin instructions   Continuous Infusions:  dialysis solution 1.5% low-MG/low-CA     dialysis solution 2.5% low-MG/low-CA     piperacillin-tazobactam (ZOSYN)  IV 2.25 g (12/03/21 0934)     LOS: 5 days    Phillips Climes, MD [facility] To contact the attending  provider between 7A-7P or the covering provider during after hours 7P-7A, please log into the web site [email or web address] and access using universal [place] password for that web site. If you do not have the password, please call the hospital operator.  12/03/2021, 2:00 PM

## 2021-12-03 NOTE — Consult Note (Addendum)
[place]  [phone] [phone]   MEDICAL ONCOLOGY - INITIAL CONSULTATION  Referral MD: Dr. Emeline Gins Elgergawy  Have seen the patient, examined him and spent extensive amount of time counseling the patient, his wife and son Reason for Referral: Lung adenocarcinoma  HPI: Mr. Sacra is an 87 year old male with a past medical history significant for end-stage renal disease on peritoneal dialysis, COPD, recurrent pneumonia, A. fib on anticoagulation, history of colon cancer diagnosed in 2007-I do not have the full records but patient reports that he had surgery and did not require any systemic therapy.  He presented to the emergency department following a visit at the pulmonology office.  He was sent to the ED due to pneumonia seen on chest x-ray.  He has been treated recently for recurrent pneumonia.  He was having a productive cough and weakness.  CT chest without contrast was performed on 11/28/2021 which showed a moderate size complex and loculated right pleural effusion suspicious for empyema, large right upper lobe airspace opacity consistent with pneumonia, atelectasis of the right lower lobe, multiple enlarged right supraclavicular, right paratracheal, left hilar, and AP window lymph nodes which could be inflammatory versus neoplasm.  He was seen by pulmonology and underwent flex bronchoscopy and EBUS on 11/30/2021.  Cytology consistent with adenocarcinoma.  He has been meeting with the palliative care team and is considering a comfort based approach.  I met with the patient in his hospital room.  He reports that he is feeling fine.  He has no specific complaints.  Reports improvement in his breathing.  He continues to have a congested cough.  He is not having any fevers or chills.  He denies headaches and visual changes.  Denies chest pain, abdominal pain, nausea, vomiting.  No bleeding reported.  The patient is married and has 2 children.  He lives in [place].  He reports that he smoked cigarettes for a handful years in college only but quit in the early 1960s.  Reports that he drank alcohol daily but quit about 4 years ago.  He has a personal history of colon cancer diagnosed in 2007 status post surgery but no additional systemic therapy.  Family history significant for brothers with cancer, mother with cancer, and grandparents with cancer.  Medical oncology was asked to see the patient to make recommendations regarding his newly diagnosed lung adenocarcinoma.  Past Medical History:  Diagnosis Date   Alcohol abuse    Colon cancer ([place])    COPD (chronic obstructive pulmonary disease) ([place])    Emphysema of lung ([place])    Enteritis due to Clostridium difficile    ESRD (end stage renal disease) ([place])    Former tobacco use    Hypertension    Peripheral edema    Sepsis ([place]) 10/2016   Aspiration PNA/C diff colitis  :   Past Surgical History:  Procedure Laterality Date   A/V FISTULAGRAM N/A 10/19/2019   Procedure: A/V FISTULAGRAM- Left Arm;  Surgeon: Angelia Mould, MD;  Location: [facility];  Service: Cardiovascular;  Laterality: N/A;   AV FISTULA PLACEMENT Left 06/14/2019   Procedure: Creation of Left arm arteriovenous fistula;  Surgeon: Rosetta Posner, MD;  Location: [facility];  Service: Vascular;  Laterality: Left;   BRONCHIAL NEEDLE ASPIRATION BIOPSY  11/30/2021   Procedure: BRONCHIAL NEEDLE ASPIRATION BIOPSIES;  Surgeon: Freddi Starr, MD;  Location: [facility];  Service: Pulmonary;;   CARDIOVERSION N/A 03/16/2019   Procedure: CARDIOVERSION;  Surgeon:  Pixie Casino, MD;  Location: [facility] ENDOSCOPY;  Service: Cardiovascular;  Laterality: N/A;   CARDIOVERSION N/A 03/23/2019   Procedure: CARDIOVERSION;  Surgeon: Lelon Perla, MD;  Location: [facility] ENDOSCOPY;  Service: Cardiovascular;  Laterality: N/A;   CATARACT EXTRACTION     COLON RESECTION     COLONOSCOPY     HEMOSTASIS CONTROL  11/30/2021   Procedure:  HEMOSTASIS CONTROL;  Surgeon: Freddi Starr, MD;  Location: [facility] ENDOSCOPY;  Service: Pulmonary;;  epi and iced saline   IMPLANTATION BONE ANCHORED HEARING AID Left 2016   IR FLUORO GUIDE CV LINE RIGHT  06/09/2019   IR THORACENTESIS ASP PLEURAL SPACE W/IMG GUIDE  05/21/2019   IR THORACENTESIS ASP PLEURAL SPACE W/IMG GUIDE  10/19/2021   IR US GUIDE VASC ACCESS RIGHT  06/09/2019   MINOR HEMORRHOIDECTOMY  1995   TEE WITHOUT CARDIOVERSION N/A 03/16/2019   Procedure: TRANSESOPHAGEAL ECHOCARDIOGRAM (TEE);  Surgeon: Pixie Casino, MD;  Location: [facility] ENDOSCOPY;  Service: Cardiovascular;  Laterality: N/A;   TONSILLECTOMY     VIDEO BRONCHOSCOPY WITH ENDOBRONCHIAL ULTRASOUND Right 11/30/2021   Procedure: VIDEO BRONCHOSCOPY WITH ENDOBRONCHIAL ULTRASOUND;  Surgeon: Freddi Starr, MD;  Location: [facility] ENDOSCOPY;  Service: Pulmonary;  Laterality: Right;  high risk, patient agreeable  :   Current Facility-Administered Medications  Medication Dose Route Frequency Provider Last Rate Last Admin   acetaminophen (TYLENOL) tablet 650 mg  650 mg Oral Q6H PRN Kristopher Oppenheim, DO       Or   acetaminophen (TYLENOL) suppository 650 mg  650 mg Rectal Q6H PRN Kristopher Oppenheim, DO       arformoterol [facility]) nebulizer solution 15 mcg  15 mcg Nebulization BID Freddi Starr, MD   15 mcg at 12/03/21 0923   calcitonin (salmon) (MIACALCIN/FORTICAL) nasal spray 1 spray  1 spray Alternating Nares Daily Nita Sells, MD   1 spray at 12/02/21 0901   dialysis solution 1.5% low-MG/low-CA dianeal solution   Intraperitoneal Q24H Roney Jaffe, MD       dialysis solution 2.5% low-MG/low-CA dianeal solution   Intraperitoneal Q24H Ernest Haber, PA-C       gentamicin cream (GARAMYCIN) 0.1 % 1 application  1 application Topical Daily Penninger, [place], [place]   1 application at 30/07/62 1705   heparin 1000 unit/ml injection 500 Units  500 Units Intraperitoneal PRN Penninger, Ria Comment, PA       ipratropium-albuterol (DUONEB) 0.5-2.5  (3) MG/3ML nebulizer solution 3 mL  3 mL Nebulization Q2H PRN Davonna Belling, MD   3 mL at 11/27/21 1735   magnesium oxide (MAG-OX) tablet 800 mg  800 mg Oral BID Nita Sells, MD   800 mg at 12/03/21 0929   metoprolol succinate (TOPROL-XL) 24 hr tablet 12.5 mg  12.5 mg Oral Daily Nita Sells, MD   12.5 mg at 12/01/21 0905   midodrine (PROAMATINE) tablet 20 mg  20 mg Oral TID WC Nita Sells, MD   20 mg at 12/03/21 0929   midodrine (PROAMATINE) tablet 5 mg  5 mg Oral QHS PRN Penninger, Ria Comment, PA       ondansetron (ZOFRAN) tablet 4 mg  4 mg Oral Q6H PRN Kristopher Oppenheim, DO       Or   ondansetron [facility]) injection 4 mg  4 mg Intravenous Q6H PRN Kristopher Oppenheim, DO   4 mg at 11/30/21 1426   oxyCODONE (Oxy IR/ROXICODONE) immediate release tablet 5 mg  5 mg Oral Q4H PRN Kristopher Oppenheim, DO   5 mg at 11/30/21 0828   pantoprazole (  PROTONIX) EC tablet 40 mg  40 mg Oral BID AC Reome, Earle J, RPH   40 mg at 12/03/21 0929   piperacillin-tazobactam (ZOSYN) IVPB 2.25 g  2.25 g Intravenous Q8H Laren Everts, RPH 100 mL/hr at 12/03/21 0934 2.25 g at 12/03/21 0934   polyethylene glycol (MIRALAX / GLYCOLAX) packet 17 g  17 g Oral Daily Penninger, [place], [place]   17 g at 12/02/21 1275   revefenacin (YUPELRI) nebulizer solution 175 mcg  175 mcg Nebulization Daily Freddi Starr, MD       sevelamer carbonate (RENVELA) tablet 800 mg  800 mg Oral TID WC Kristopher Oppenheim, DO   800 mg at 12/03/21 1700   vancomycin variable dose per unstable renal function (pharmacist dosing)   Does not apply See admin instructions Laren Everts, RPH          Allergies  Allergen Reactions   Amlodipine Swelling   Tizanidine Other (See Comments)    Other reaction(s): hypotension  :   Family History  Problem Relation Age of Onset   Brain cancer Father    Colon cancer Mother        with mets to liver   Lung cancer Brother        was a smoker  :   Social History   Socioeconomic History   Marital status:  Married    Spouse name: Not on file   Number of children: 3   Years of education: Not on file   Highest education level: Not on file  Occupational History   Occupation: Retired- Associate Professor  Tobacco Use   Smoking status: Former    Packs/day: 1.00    Years: 20.00    Pack years: 20.00    Types: Cigarettes    Quit date: 12/20/1961    Years since quitting: 59.9   Smokeless tobacco: Never  Vaping Use   Vaping Use: Never used  Substance and Sexual Activity   Alcohol use: Yes    Alcohol/week: 7.0 standard drinks    Types: 7 Standard drinks or equivalent per week    Comment: states he drinks 3-4 days a week, "a couple" on those occasions but does not further quaniity.   Drug use: No   Sexual activity: Never  Other Topics Concern   Not on file  Social History Narrative   Not on file   Social Determinants of Health   Financial Resource Strain: Not on file  Food Insecurity: Not on file  Transportation Needs: Not on file  Physical Activity: Not on file  Stress: Not on file  Social Connections: Not on file  Intimate Partner Violence: Not on file  :  Review of Systems: A comprehensive 14 point review of systems was negative except as noted in the HPI.  Exam: Patient Vitals for the past 24 hrs:  BP Temp Temp src Pulse Resp SpO2 Weight  12/03/21 0937 -- -- -- -- -- 92 % --  12/03/21 0830 (!) 81/51 97.6 F (36.4 C) Oral 98 16 92 % --  12/03/21 0802 (!) 80/46 -- -- 87 14 92 % --  12/03/21 0500 -- -- -- -- -- -- 87 kg  12/03/21 0322 (!) 81/51 97.9 F (36.6 C) Oral 87 19 93 % --  12/03/21 0100 (!) 86/49 97.8 F (36.6 C) Oral 90 20 91 % --  12/02/21 2346 (!) 88/60 97.8 F (36.6 C) Tympanic 84 20 95 % --  12/02/21 2030 (!) 93/57 97.6 F (36.4 C)  Oral 100 17 95 % --  12/02/21 2026 -- -- -- -- -- 92 % --  12/02/21 1759 (!) 91/58 -- -- -- -- -- --  12/02/21 1700 (!) 90/49 -- -- -- -- -- --  12/02/21 1641 (!) 80/60 (!) 97.3 F (36.3 C) Temporal 96 20 93 % --   12/02/21 1609 (!) 81/42 97.6 F (36.4 C) Oral 93 19 93 % --  12/02/21 1210 (!) 88/57 98 F (36.7 C) Oral 89 19 94 % --    General: Awake and alert, no distress Eyes:  no scleral icterus.   ENT:  There were no oropharyngeal lesions.     Lymphatics: Right supraclavicular adenopathy on exam, no cervical, axillary, inguinal adenopathy. Respiratory: Rhonchi throughout the anterior lung fields. Cardiovascular:  Regular rate and rhythm, S1/S2, without murmur, rub or gallop.  GI: Positive bowel sounds, soft, nontender.   Skin exam was without echymosis, petichae.   Neuro exam was nonfocal. Patient was alert and oriented.  Attention was good.   Language was appropriate.  Mood was normal without depression.  Speech was not pressured.  Thought content was not tangential.     Lab Results  Component Value Date   WBC 12.8 (H) 12/03/2021   HGB 8.6 (L) 12/03/2021   HCT 26.4 (L) 12/03/2021   PLT 144 (L) 12/03/2021   GLUCOSE 129 (H) 12/03/2021   CHOL 106 11/10/2016   TRIG 100 03/05/2019   HDL 60 11/10/2016   LDLCALC 29 11/10/2016   ALT 11 12/02/2021   AST 20 12/02/2021   NA 134 (L) 12/03/2021   K 3.3 (L) 12/03/2021   CL 94 (L) 12/03/2021   CREATININE 6.71 (H) 12/03/2021   BUN 52 (H) 12/03/2021   CO2 24 12/03/2021  I have personally reviewed his CT imaging  DG Chest 2 View  Result Date: 12/02/2021 CLINICAL DATA:  History of COPD EXAM: CHEST - 2 VIEW COMPARISON:  Chest x-ray 11/27/2021, CT 11/28/2021 FINDINGS: Emphysematous disease. Interval complete opacification of the right thorax, some shift of mediastinal contents to the right. Bilateral pleural effusions. Cardiomediastinal silhouette is obscured. No pneumothorax. IMPRESSION: 1. Interval complete opacification of the right thorax which may be due to combination of large pleural effusion and underlying airspace disease. Some shift of mediastinal contents to the right suggests a component of volume loss/atelectasis. 2. Small left pleural  effusion 3. Emphysema Electronically Signed   By: Donavan Foil M.D.   On: 12/02/2021 16:31   DG Chest 2 View  Result Date: 11/27/2021 CLINICAL DATA:  Pleural effusion EXAM: CHEST - 2 VIEW COMPARISON:  11/13/2021 FINDINGS: Right lower lobe and to a lesser extent right upper lobe airspace disease concerning for multilobar pneumonia. Small right pleural effusion. Mild left basilar atelectasis. No pneumothorax. Stable cardiomediastinal silhouette. No acute osseous abnormality. IMPRESSION: Right lower lobe and to a lesser extent right upper lobe airspace disease concerning for multilobar pneumonia. Electronically Signed   By: Kathreen Devoid M.D.   On: 11/27/2021 15:53   DG Chest 2 View  Result Date: 11/13/2021 CLINICAL DATA:  Productive cough. EXAM: CHEST - 2 VIEW COMPARISON:  October 22, 2021. FINDINGS: Stable cardiomediastinal silhouette. No pneumothorax is noted. Left lung is clear. Mild right pleural effusion is noted which is increased compared to prior exam. Perihilar and basilar opacity is noted concerning for pneumonia. Bony thorax is unremarkable. IMPRESSION: Right perihilar and basilar pneumonia is noted. Increased right pleural effusion. Electronically Signed   By: Marijo Conception M.D.   On: 11/13/2021  12:33   CT CHEST WO CONTRAST  Result Date: 11/28/2021 CLINICAL DATA:  [place]. EXAM: CT CHEST WITHOUT CONTRAST TECHNIQUE: Multidetector CT imaging of the chest was performed following the standard protocol without IV contrast. COMPARISON:  May 20, 2019. FINDINGS: Cardiovascular: Atherosclerosis of thoracic aorta is noted without aneurysm formation. Normal cardiac size. No pericardial effusion. Moderate coronary artery calcifications are noted. Mediastinum/Nodes: Thyroid gland and esophagus are unremarkable. Enlarged mediastinal adenopathy is noted with 18 mm aortopulmonary window lymph node. 14 mm right paratracheal lymph node is noted. 15 mm left hilar lymph node is noted. Multiple enlarged right  supraclavicular lymph nodes are noted as well. This may be inflammatory in etiology, but neoplasm cannot be excluded. Lungs/Pleura: No pneumothorax is noted. Emphysematous disease is noted. Large right upper lobe airspace opacity is noted consistent with pneumonia. Atelectasis of right lower lobe is noted. Moderate size complex and loculated right pleural effusion is noted suspicious for empyema. Minimal left posterior basilar subsegmental atelectasis is noted. Upper Abdomen: No acute abnormality. Musculoskeletal: No chest wall mass or suspicious bone lesions identified. IMPRESSION: Moderate size complex and loculated right pleural effusion is noted suspicious for empyema. Large right upper lobe airspace opacity is noted consistent with pneumonia, with atelectasis of the right lower lobe. Multiple enlarged right supraclavicular right paratracheal left hilar and AP window lymph nodes are noted which may be inflammatory in etiology, but neoplasm cannot be excluded. Follow-up is recommended. Moderate coronary artery calcifications are noted. Aortic Atherosclerosis (ICD10-I70.0) and Emphysema (ICD10-J43.9). Electronically Signed   By: Marijo Conception M.D.   On: 11/28/2021 11:54   CT Lumbar Spine Wo Contrast  Result Date: 11/27/2021 CLINICAL DATA:  Initial evaluation for acute low back pain, increased fracture risk. EXAM: CT LUMBAR SPINE WITHOUT CONTRAST TECHNIQUE: Multidetector CT imaging of the lumbar spine was performed without intravenous contrast administration. Multiplanar CT image reconstructions were also generated. COMPARISON:  None available. FINDINGS: Segmentation: Standard. Lowest well-formed disc space labeled the L5-S1 level. Alignment: Mild sigmoid scoliosis. Trace stepwise retrolisthesis of L2 on L3 through L4 on L5. Vertebrae: Acute to subacute compression fracture involving the superior endplate of L1 with up to 25% height loss without bony retropulsion. Vertebral body height otherwise maintained.  Visualized sacrum and pelvis intact. SI joints symmetric and within normal limits. No discrete or worrisome osseous lesions. Paraspinal and other soft tissues: Mild paraspinous edema adjacent to the L1 fracture. Chronic fatty atrophy noted with throughout the posterior paraspinous and gluteal musculature. Advanced aorto bi-iliac atherosclerotic disease. Right pleural effusion partially visualized. Disc levels: L1-2: Disc desiccation with mild disc bulge. Mild-to-moderate right worse than left facet hypertrophy. No spinal stenosis. Foramina remain patent. L2-3: Trace retrolisthesis with intervertebral disc space narrowing. Diffuse disc bulge without focal disc protrusion. Moderate facet hypertrophy. Mild bilateral lateral recess stenosis. Foramina remain patent. L3-4: Degenerative intervertebral disc space narrowing with retrolisthesis. Diffuse disc bulge with disc desiccation, eccentric to the right. Associated reactive endplate spurring. Moderate bilateral facet hypertrophy. Resultant mild canal with moderate bilateral lateral recess stenosis. Moderate bilateral L3 foraminal stenosis. L4-5: Degenerative intervertebral disc space narrowing with diffuse disc bulge and disc desiccation. Reactive endplate spurring. Moderate facet and ligament flavum hypertrophy. Moderate canal with bilateral subarticular stenosis. Moderate to severe bilateral L4 foraminal stenosis. L5-S1: Degenerative intervertebral disc space narrowing with diffuse disc bulge and disc desiccation. Reactive endplate spurring. Superimposed central to left subarticular disc protrusion with annular calcification (series 4, image 102). Protruding disc contacts and/or closely approximates both of the descending S1 nerve roots without  frank impingement or displacement. Mild facet hypertrophy. No significant spinal stenosis. Moderate bilateral L5 foraminal narrowing. IMPRESSION: 1. Acute to subacute compression fracture involving the superior endplate of L1  with up to 25% height loss without bony retropulsion. 2. Multilevel degenerative spondylosis and facet arthrosis as above. Resultant mild to moderate canal and bilateral subarticular stenosis at L2-3 through L4-5, with moderate to severe bilateral L3 through L5 foraminal narrowing as above. 3. Right pleural effusion, partially visualized. 4. Aortic Atherosclerosis (ICD10-I70.0). Electronically Signed   By: Jeannine Boga M.D.   On: 11/27/2021 19:48   ECHOCARDIOGRAM COMPLETE  Result Date: 11/11/2021    ECHOCARDIOGRAM REPORT   Patient Name:   RYANN LEAVITT Date of Exam: 11/11/2021 Medical Rec #:  [id number]       Height:       70.0 in Accession #:    [id number]      Weight:       190.2 lb Date of Birth:  11/07/1933      BSA:          2.043 m Patient Age:    3 years        BP:           100/64 mmHg Patient Gender: M               HR:           105 bpm. Exam Location:  [place] Procedure: 2D Echo, Cardiac Doppler and Color Doppler Indications:    J90 Bilateral Pleural Effusion  History:        Patient has prior history of Echocardiogram examinations, most                 recent 05/21/2019. COPD; Risk Factors:Alcohol Abuse, Former Smoker                 and Hypertension. Emphysema of Lung. End Stage Renal Disease.  Sonographer:    Wilford Sports Rodgers-Jones RDCS Referring Phys: [place]  1. Left ventricular ejection fraction, by estimation, is 45 to 50%. The left ventricle has mildly decreased function. The left ventricle demonstrates global hypokinesis. Left ventricular diastolic function could not be evaluated.  2. Right ventricular systolic function is normal. The right ventricular size is normal.  3. The mitral valve is normal in structure. No evidence of mitral valve regurgitation. No evidence of mitral stenosis.  4. The aortic valve is calcified. There is mild calcification of the aortic valve. There is mild thickening of the aortic valve. Aortic valve regurgitation is not  visualized. Mild aortic valve stenosis. FINDINGS  Left Ventricle: Left ventricular ejection fraction, by estimation, is 45 to 50%. The left ventricle has mildly decreased function. The left ventricle demonstrates global hypokinesis. The left ventricular internal cavity size was normal in size. There is  no left ventricular hypertrophy. Left ventricular diastolic function could not be evaluated due to atrial fibrillation. Left ventricular diastolic function could not be evaluated. Right Ventricle: The right ventricular size is normal. Right vetricular wall thickness was not well visualized. Right ventricular systolic function is normal. Left Atrium: Left atrial size was normal in size. Right Atrium: Right atrial size was normal in size. Pericardium: There is no evidence of pericardial effusion. Mitral Valve: The mitral valve is normal in structure. No evidence of mitral valve regurgitation. No evidence of mitral valve stenosis. Tricuspid Valve: The tricuspid valve is normal in structure. Tricuspid valve regurgitation is mild. Aortic Valve: The aortic valve is calcified. There  is mild calcification of the aortic valve. There is mild thickening of the aortic valve. There is mild aortic valve annular calcification. Aortic valve regurgitation is not visualized. Mild aortic stenosis is present. Pulmonic Valve: The pulmonic valve was grossly normal. Pulmonic valve regurgitation is not visualized. Aorta: The aortic root and ascending aorta are structurally normal, with no evidence of dilitation. IAS/Shunts: The interatrial septum was not well visualized.  LEFT VENTRICLE PLAX 2D LVIDd:         4.20 cm LVIDs:         3.10 cm LV PW:         1.10 cm LV IVS:        0.90 cm LVOT diam:     2.20 cm LV SV:         38 LV SV Index:   19 LVOT Area:     3.80 cm  RIGHT VENTRICLE RV Basal diam:  4.70 cm LEFT ATRIUM             Index        RIGHT ATRIUM           Index LA diam:        4.50 cm 2.20 cm/m   RA Area:     15.60 cm LA Vol  (A2C):   26.8 ml 13.12 ml/m  RA Volume:   35.40 ml  17.33 ml/m LA Vol (A4C):   33.3 ml 16.30 ml/m LA Biplane Vol: 30.7 ml 15.03 ml/m  AORTIC VALVE LVOT Vmax:   68.15 cm/s LVOT Vmean:  45.050 cm/s LVOT VTI:    0.100 m  AORTA Ao Root diam: 3.60 cm Ao Asc diam:  3.30 cm MV E velocity: 97.42 cm/s  TRICUSPID VALVE                            TR Peak grad:   20.8 mmHg                            TR Vmax:        228.00 cm/s                             SHUNTS                            Systemic VTI:  0.10 m                            Systemic Diam: 2.20 cm Mertie Moores MD Electronically signed by Mertie Moores MD Signature Date/Time: 11/11/2021/3:54:25 PM    Final    LONG TERM MONITOR (3-14 DAYS)  Result Date: 11/18/2021 Patch Wear Time:  4 days and 20 hours (2022-11-13T16:22:08-499 to 2022-11-18T13:13:16-0500) 7 Ventricular Tachycardia runs occurred, the run with the fastest interval lasting 4 beats with a max rate of 176 bpm, the longest lasting 5 beats with an avg rate of 144 bpm. Atrial Fibrillation occurred continuously (100% burden), ranging from 70-171 bpm (avg of 108 bpm). Isolated VEs were occasional (3.0%, 19281), VE Couplets were rare (<1.0%, 2054), and VE Triplets were rare (<1.0%, 94). Ventricular Bigeminy and Trigeminy were present. 1.  Persistent atrial fibrillation 2.  Frequent PVCs    DG Chest 2 View  Result Date: 12/02/2021 CLINICAL DATA:  History of  COPD EXAM: CHEST - 2 VIEW COMPARISON:  Chest x-ray 11/27/2021, CT 11/28/2021 FINDINGS: Emphysematous disease. Interval complete opacification of the right thorax, some shift of mediastinal contents to the right. Bilateral pleural effusions. Cardiomediastinal silhouette is obscured. No pneumothorax. IMPRESSION: 1. Interval complete opacification of the right thorax which may be due to combination of large pleural effusion and underlying airspace disease. Some shift of mediastinal contents to the right suggests a component of volume  loss/atelectasis. 2. Small left pleural effusion 3. Emphysema Electronically Signed   By: Donavan Foil M.D.   On: 12/02/2021 16:31   DG Chest 2 View  Result Date: 11/27/2021 CLINICAL DATA:  Pleural effusion EXAM: CHEST - 2 VIEW COMPARISON:  11/13/2021 FINDINGS: Right lower lobe and to a lesser extent right upper lobe airspace disease concerning for multilobar pneumonia. Small right pleural effusion. Mild left basilar atelectasis. No pneumothorax. Stable cardiomediastinal silhouette. No acute osseous abnormality. IMPRESSION: Right lower lobe and to a lesser extent right upper lobe airspace disease concerning for multilobar pneumonia. Electronically Signed   By: Kathreen Devoid M.D.   On: 11/27/2021 15:53   DG Chest 2 View  Result Date: 11/13/2021 CLINICAL DATA:  Productive cough. EXAM: CHEST - 2 VIEW COMPARISON:  October 22, 2021. FINDINGS: Stable cardiomediastinal silhouette. No pneumothorax is noted. Left lung is clear. Mild right pleural effusion is noted which is increased compared to prior exam. Perihilar and basilar opacity is noted concerning for pneumonia. Bony thorax is unremarkable. IMPRESSION: Right perihilar and basilar pneumonia is noted. Increased right pleural effusion. Electronically Signed   By: Marijo Conception M.D.   On: 11/13/2021 12:33   CT CHEST WO CONTRAST  Result Date: 11/28/2021 CLINICAL DATA:  [place]. EXAM: CT CHEST WITHOUT CONTRAST TECHNIQUE: Multidetector CT imaging of the chest was performed following the standard protocol without IV contrast. COMPARISON:  May 20, 2019. FINDINGS: Cardiovascular: Atherosclerosis of thoracic aorta is noted without aneurysm formation. Normal cardiac size. No pericardial effusion. Moderate coronary artery calcifications are noted. Mediastinum/Nodes: Thyroid gland and esophagus are unremarkable. Enlarged mediastinal adenopathy is noted with 18 mm aortopulmonary window lymph node. 14 mm right paratracheal lymph node is noted. 15 mm left hilar lymph  node is noted. Multiple enlarged right supraclavicular lymph nodes are noted as well. This may be inflammatory in etiology, but neoplasm cannot be excluded. Lungs/Pleura: No pneumothorax is noted. Emphysematous disease is noted. Large right upper lobe airspace opacity is noted consistent with pneumonia. Atelectasis of right lower lobe is noted. Moderate size complex and loculated right pleural effusion is noted suspicious for empyema. Minimal left posterior basilar subsegmental atelectasis is noted. Upper Abdomen: No acute abnormality. Musculoskeletal: No chest wall mass or suspicious bone lesions identified. IMPRESSION: Moderate size complex and loculated right pleural effusion is noted suspicious for empyema. Large right upper lobe airspace opacity is noted consistent with pneumonia, with atelectasis of the right lower lobe. Multiple enlarged right supraclavicular right paratracheal left hilar and AP window lymph nodes are noted which may be inflammatory in etiology, but neoplasm cannot be excluded. Follow-up is recommended. Moderate coronary artery calcifications are noted. Aortic Atherosclerosis (ICD10-I70.0) and Emphysema (ICD10-J43.9). Electronically Signed   By: Marijo Conception M.D.   On: 11/28/2021 11:54   CT Lumbar Spine Wo Contrast  Result Date: 11/27/2021 CLINICAL DATA:  Initial evaluation for acute low back pain, increased fracture risk. EXAM: CT LUMBAR SPINE WITHOUT CONTRAST TECHNIQUE: Multidetector CT imaging of the lumbar spine was performed without intravenous contrast administration. Multiplanar CT image reconstructions were also  generated. COMPARISON:  None available. FINDINGS: Segmentation: Standard. Lowest well-formed disc space labeled the L5-S1 level. Alignment: Mild sigmoid scoliosis. Trace stepwise retrolisthesis of L2 on L3 through L4 on L5. Vertebrae: Acute to subacute compression fracture involving the superior endplate of L1 with up to 25% height loss without bony retropulsion.  Vertebral body height otherwise maintained. Visualized sacrum and pelvis intact. SI joints symmetric and within normal limits. No discrete or worrisome osseous lesions. Paraspinal and other soft tissues: Mild paraspinous edema adjacent to the L1 fracture. Chronic fatty atrophy noted with throughout the posterior paraspinous and gluteal musculature. Advanced aorto bi-iliac atherosclerotic disease. Right pleural effusion partially visualized. Disc levels: L1-2: Disc desiccation with mild disc bulge. Mild-to-moderate right worse than left facet hypertrophy. No spinal stenosis. Foramina remain patent. L2-3: Trace retrolisthesis with intervertebral disc space narrowing. Diffuse disc bulge without focal disc protrusion. Moderate facet hypertrophy. Mild bilateral lateral recess stenosis. Foramina remain patent. L3-4: Degenerative intervertebral disc space narrowing with retrolisthesis. Diffuse disc bulge with disc desiccation, eccentric to the right. Associated reactive endplate spurring. Moderate bilateral facet hypertrophy. Resultant mild canal with moderate bilateral lateral recess stenosis. Moderate bilateral L3 foraminal stenosis. L4-5: Degenerative intervertebral disc space narrowing with diffuse disc bulge and disc desiccation. Reactive endplate spurring. Moderate facet and ligament flavum hypertrophy. Moderate canal with bilateral subarticular stenosis. Moderate to severe bilateral L4 foraminal stenosis. L5-S1: Degenerative intervertebral disc space narrowing with diffuse disc bulge and disc desiccation. Reactive endplate spurring. Superimposed central to left subarticular disc protrusion with annular calcification (series 4, image 102). Protruding disc contacts and/or closely approximates both of the descending S1 nerve roots without frank impingement or displacement. Mild facet hypertrophy. No significant spinal stenosis. Moderate bilateral L5 foraminal narrowing. IMPRESSION: 1. Acute to subacute compression  fracture involving the superior endplate of L1 with up to 25% height loss without bony retropulsion. 2. Multilevel degenerative spondylosis and facet arthrosis as above. Resultant mild to moderate canal and bilateral subarticular stenosis at L2-3 through L4-5, with moderate to severe bilateral L3 through L5 foraminal narrowing as above. 3. Right pleural effusion, partially visualized. 4. Aortic Atherosclerosis (ICD10-I70.0). Electronically Signed   By: Jeannine Boga M.D.   On: 11/27/2021 19:48   ECHOCARDIOGRAM COMPLETE  Result Date: 11/11/2021    ECHOCARDIOGRAM REPORT   Patient Name:   MARILYN WING Date of Exam: 11/11/2021 Medical Rec #:  [id number]       Height:       70.0 in Accession #:    [id number]      Weight:       190.2 lb Date of Birth:  July 15, 1933      BSA:          2.043 m Patient Age:    29 years        BP:           100/64 mmHg Patient Gender: M               HR:           105 bpm. Exam Location:  [place] Procedure: 2D Echo, Cardiac Doppler and Color Doppler Indications:    J90 Bilateral Pleural Effusion  History:        Patient has prior history of Echocardiogram examinations, most                 recent 05/21/2019. COPD; Risk Factors:Alcohol Abuse, Former Smoker                 and Hypertension.  Emphysema of Lung. End Stage Renal Disease.  Sonographer:    Wilford Sports Rodgers-Jones RDCS Referring Phys: [place]  1. Left ventricular ejection fraction, by estimation, is 45 to 50%. The left ventricle has mildly decreased function. The left ventricle demonstrates global hypokinesis. Left ventricular diastolic function could not be evaluated.  2. Right ventricular systolic function is normal. The right ventricular size is normal.  3. The mitral valve is normal in structure. No evidence of mitral valve regurgitation. No evidence of mitral stenosis.  4. The aortic valve is calcified. There is mild calcification of the aortic valve. There is mild thickening of the aortic  valve. Aortic valve regurgitation is not visualized. Mild aortic valve stenosis. FINDINGS  Left Ventricle: Left ventricular ejection fraction, by estimation, is 45 to 50%. The left ventricle has mildly decreased function. The left ventricle demonstrates global hypokinesis. The left ventricular internal cavity size was normal in size. There is  no left ventricular hypertrophy. Left ventricular diastolic function could not be evaluated due to atrial fibrillation. Left ventricular diastolic function could not be evaluated. Right Ventricle: The right ventricular size is normal. Right vetricular wall thickness was not well visualized. Right ventricular systolic function is normal. Left Atrium: Left atrial size was normal in size. Right Atrium: Right atrial size was normal in size. Pericardium: There is no evidence of pericardial effusion. Mitral Valve: The mitral valve is normal in structure. No evidence of mitral valve regurgitation. No evidence of mitral valve stenosis. Tricuspid Valve: The tricuspid valve is normal in structure. Tricuspid valve regurgitation is mild. Aortic Valve: The aortic valve is calcified. There is mild calcification of the aortic valve. There is mild thickening of the aortic valve. There is mild aortic valve annular calcification. Aortic valve regurgitation is not visualized. Mild aortic stenosis is present. Pulmonic Valve: The pulmonic valve was grossly normal. Pulmonic valve regurgitation is not visualized. Aorta: The aortic root and ascending aorta are structurally normal, with no evidence of dilitation. IAS/Shunts: The interatrial septum was not well visualized.  LEFT VENTRICLE PLAX 2D LVIDd:         4.20 cm LVIDs:         3.10 cm LV PW:         1.10 cm LV IVS:        0.90 cm LVOT diam:     2.20 cm LV SV:         38 LV SV Index:   19 LVOT Area:     3.80 cm  RIGHT VENTRICLE RV Basal diam:  4.70 cm LEFT ATRIUM             Index        RIGHT ATRIUM           Index LA diam:        4.50 cm 2.20  cm/m   RA Area:     15.60 cm LA Vol (A2C):   26.8 ml 13.12 ml/m  RA Volume:   35.40 ml  17.33 ml/m LA Vol (A4C):   33.3 ml 16.30 ml/m LA Biplane Vol: 30.7 ml 15.03 ml/m  AORTIC VALVE LVOT Vmax:   68.15 cm/s LVOT Vmean:  45.050 cm/s LVOT VTI:    0.100 m  AORTA Ao Root diam: 3.60 cm Ao Asc diam:  3.30 cm MV E velocity: 97.42 cm/s  TRICUSPID VALVE  TR Peak grad:   20.8 mmHg                            TR Vmax:        228.00 cm/s                             SHUNTS                            Systemic VTI:  0.10 m                            Systemic Diam: 2.20 cm Mertie Moores MD Electronically signed by Mertie Moores MD Signature Date/Time: 11/11/2021/3:54:25 PM    Final    LONG TERM MONITOR (3-14 DAYS)  Result Date: 11/18/2021 Patch Wear Time:  4 days and 20 hours (2022-11-13T16:22:08-499 to 2022-11-18T13:13:16-0500) 7 Ventricular Tachycardia runs occurred, the run with the fastest interval lasting 4 beats with a max rate of 176 bpm, the longest lasting 5 beats with an avg rate of 144 bpm. Atrial Fibrillation occurred continuously (100% burden), ranging from 70-171 bpm (avg of 108 bpm). Isolated VEs were occasional (3.0%, 19281), VE Couplets were rare (<1.0%, 2054), and VE Triplets were rare (<1.0%, 94). Ventricular Bigeminy and Trigeminy were present. 1.  Persistent atrial fibrillation 2.  Frequent PVCs   Pathology:  CYTOLOGY - NON PAP  CASE: [id number]  PATIENT: Cherre Huger  Non-Gynecological Cytology Report   Clinical History: Endobronchial obstruction   FINAL MICROSCOPIC DIAGNOSIS:   A. LYMPH NODE, 4R NODE, FINE NEEDLE ASPIRATION:  - Malignant cells consistent with adenocarcinoma, see comment   B. LYMPH NODE, 7 NODE, FINE NEEDLE ASPIRATION:  - Malignant cells consistent with adenocarcinoma   COMMENT:   A and B.   Dr. Saralyn Pilar reviewed the case and concurs with the diagnosis.  Dr. Erin Fulling was notified on 12/02/2021.   Assessment and Plan:   Lung  adenocarcinoma Discussed CT scan of the chest as well as biopsy results with the patient Although he has not completed a full staging work-up, appears to have metastatic lung cancer given multiple enlarged lymph nodes The patient was very clear that he would not consider taking systemic chemotherapy Additionally, we discussed that treatment options were limited due to his renal function The patient has made up his mind even before walking that he does not want treatment To complete work-up, he will have needed outpatient PET CT scan, MRI of the brain and additional molecular testing However, given his poor performance status, generalized weakness and the extent of the disease, overall, his prognosis is very poor I explained to the patient the reason why surgery is not indicated I explained to him why radiation is not indicated Ultimately, we are in agreement to focus on supportive care only Due to his ongoing need for peritoneal dialysis, he is not a candidate for full comfort care with hospice He will continue outpatient home health and they could consult palliative care to follow and transition him to hospice when he is ready to stop dialysis We discussed prognosis will be less than 6 months without further treatment We reviewed CODE STATUS and the patient desires to be DNR  Pleural effusion Likely malignant His oxygen saturation is adequate He does not need thoracentesis now  Macrocytic anemia He does not have  any evidence of iron deficiency No recent vitamin B12 level or folate level on his chart-could consider checking Anemia likely due to underlying renal disease  Thrombocytopenia Mild thrombocytopenia noted Monitor  Recurrent pneumonia On antibiotics per hospitalist, can probably switch to oral antibiotics upon discharge  End-stage renal disease on peritoneal dialysis Nephrology following, he does not seem to be ready to stop peritoneal dialysis I would defer to his  nephrologist for management  Discharge planning He should be ready for discharge in the near future, probably tomorrow He does not need oncology outpatient follow-up  Thank you for this referral.   Mikey Bussing, DNP, AGPCNP-BC, AOCNP  Heath Lark, MD

## 2021-12-03 NOTE — Progress Notes (Signed)
Daily Progress Note   Patient Name: David Barton       Date: 12/03/2021 DOB: 09/12/1933  Age: 87 y.o. MRN#: [id number] Attending Physician: Albertine Patricia, MD Primary Care Physician: Chesley Noon, MD Admit Date: 11/27/2021  Reason for Consultation/Follow-up: Establishing goals of care  Subjective: Patient on PD - no complaints - denies pain, shortness of breath. Hopeful to work with therapy today.   Length of Stay: 5  Current Medications: Scheduled Meds:   arformoterol  15 mcg Nebulization BID   calcitonin (salmon)  1 spray Alternating Nares Daily   gentamicin cream  1 application Topical Daily   magnesium oxide  800 mg Oral BID   metoprolol succinate  12.5 mg Oral Daily   midodrine  20 mg Oral TID WC   pantoprazole  40 mg Oral BID AC   polyethylene glycol  17 g Oral Daily   revefenacin  175 mcg Nebulization Daily   sevelamer carbonate  800 mg Oral TID WC   vancomycin variable dose per unstable renal function (pharmacist dosing)   Does not apply See admin instructions    Continuous Infusions:  dialysis solution 1.5% low-MG/low-CA     dialysis solution 2.5% low-MG/low-CA     piperacillin-tazobactam (ZOSYN)  IV 2.25 g (12/03/21 0934)    PRN Meds: acetaminophen **OR** acetaminophen, heparin, ipratropium-albuterol, midodrine, ondansetron **OR** ondansetron (ZOFRAN) IV, oxyCODONE  Physical Exam Constitutional:      General: He is not in acute distress. Pulmonary:     Effort: Pulmonary effort is normal.     Comments: Remains on [place] Skin:    General: Skin is warm and dry.  Neurological:     Mental Status: He is alert and oriented to person, place, and time.            Vital Signs: BP (!) 81/51 (BP Location: Left Arm)    Pulse 98    Temp 97.6 F (36.4 C) (Oral)    Resp 16     Ht 5\' 10"  (1.778 m)    Wt 87 kg    SpO2 92%    BMI 27.52 kg/m  SpO2: SpO2: 92 % O2 Device: O2 Device: Nasal Cannula O2 Flow Rate: O2 Flow Rate (L/min): 5 L/min  Intake/output summary:  Intake/Output Summary (Last 24 hours) at 12/03/2021 1103 Last data filed at 12/03/2021 0400 Gross per 24 hour  Intake 18658.97 ml  Output --  Net 18658.97 ml   LBM: Last BM Date: 11/29/21 Baseline Weight: Weight: 84.4 kg Most recent weight: Weight: 87 kg       Palliative Assessment/Data: PPS 50%    Flowsheet Rows    Flowsheet Row Most Recent Value  Intake Tab   Referral Department Hospitalist  Unit at Time of Referral Intermediate Care Unit  Palliative Care Primary Diagnosis Cancer  Date Notified 12/02/21  Palliative Care Type New Palliative care  Reason for referral Clarify Goals of Care  Date of Admission 11/27/21  Date first seen by Palliative Care 12/02/21  # of days Palliative referral response time 0 Day(s)  # of days IP prior to Palliative referral 5  Clinical Assessment   Psychosocial & Spiritual Assessment   Palliative Care Outcomes  Patient Active Problem List   Diagnosis Date Noted   GI bleed 11/28/2021   Closed compression fracture of body of L1 vertebra ([place]) 11/28/2021   Multifocal pneumonia 11/27/2021   Community acquired pneumonia of right lung 11/13/2021   Medication management 10/17/2020   Healthcare maintenance 10/17/2020   Musculoskeletal back pain 09/17/2020   Chronic anticoagulation 07/11/2019   Chronic bilateral pleural effusions  R>L 06/28/2019   Macrocytic anemia 06/07/2019   Lung infiltrate    Shortness of breath    DNR (do not resuscitate)    Palliative care by specialist    S/P thoracentesis    SOB (shortness of breath)    Leukocytosis    ESRD (end stage renal disease) ([place])    Acute on chronic combined systolic and diastolic heart failure ([place])    COPD GOLD II     Weakness generalized 05/30/2019   Acute on chronic respiratory  failure ([place]) 05/19/2019   Chronic respiratory failure with hypoxia and hypercapnia ([place]) 05/06/2019   DOE (dyspnea on exertion) 05/04/2019   Chronic respiratory failure with hypoxia ([place]) 04/25/2019   History of anemia due to chronic kidney disease 04/17/2019   Pressure injury of skin 03/17/2019   Longstanding persistent atrial fibrillation ([place]) 03/11/2019   Chronic diastolic heart failure ([place]) 01/17/2019   Edema of both legs 01/02/2019   Pulmonary edema    Sleep apnea    Elevated troponin 11/10/2016   Cardiomyopathy ([place]) 11/10/2016   PSVT (paroxysmal supraventricular tachycardia) ([place]) 11/10/2016   Dysphagia    Aspiration pneumonia ([place]) 11/04/2016   Diarrhea 11/04/2016   Essential hypertension 05/31/2013   COPD with acute exacerbation ([place]) 05/29/2013   Hypokalemia 05/08/2013   Hypotension 05/08/2013   Dizziness 05/08/2013   Hypoxia 05/08/2013   Acute respiratory failure ([place]) 05/08/2013    Palliative Care Assessment & Plan   HPI: 87 y.o. male  with past medical history of COPD, a fib, HFrEF, colon cancer, c diff, htn, and ESRD on PD admitted on 11/27/2021 with multilobar pna - failed outpatient treatment. Concern for postobstructive pna - had bronch 12/12, concerning for malignancy, awaiting biopsies. Continues on PD with no complications. Has some hypotension. Also with L1 fracture.  PMT consulted for [place].   Assessment: Follow up with patient today. We review his conversation with Dr. Erin Fulling yesterday afternoon - he understands he has cancer. He remains committed to what he told me yesterday - he would like more information from oncology prior to making any decisions. We discussed that oncology has been consulted. He again shares that he is not sure if he wants to pursue treatment. He is mostly concerned about side effects of treatment and tells me he does not plan to pursue any treatment with expected severe side effects - I encouraged him to ask specific questions to  oncology about treatment options and side effects. He also tells me if his prognosis is short then he would not want to pursue treatment - if time is limited to months he is not interested in pursuing treatment at all and would prefer to go home with hospice. We review specific questions for him to ask of oncology to gather information he needs to make a decision on how to move forward.  Recommendations/Plan: Patient understands he has cancer - awaiting discussions with oncology about options/prognosis prior to making any decisions He is considering home with hospice depending on conversation with oncology Patient shares his most important goal is to be home by christmas Does not want to wear  cpap anymore  Code Status: DNR  Discharge Planning: To Be Determined  Care plan was discussed with patient  Thank you for allowing the Palliative Medicine Team to assist in the care of this patient.   Total Time 20 minutes Prolonged Time Billed  no       Greater than 50%  of this time was spent counseling and coordinating care related to the above assessment and plan.  Juel Burrow, DNP, [facility] Palliative Medicine Team Team Phone # [phone]  Pager [phone]

## 2021-12-03 NOTE — Progress Notes (Signed)
[facility] Progress Note   Subjective:  Seen in room. PD cycler malfunction last night, had to change machines. Didn't get any sleep.  Waiting to discuss bronch results with oncology   Objective Vitals:   12/03/21 0500 12/03/21 0802 12/03/21 0830 12/03/21 0937  BP:  (!) 80/46 (!) 81/51   Pulse:  87 98   Resp:  14 16   Temp:   97.6 F (36.4 C)   TempSrc:   Oral   SpO2:  92% 92% 92%  Weight: 87 kg     Height:        87 kg  Additional Objective Labs: Basic Metabolic Panel: Recent Labs  Lab 12/01/21 0133 12/02/21 0129 12/03/21 0124  NA 133* 133* 134*  K 3.7 3.5 3.3*  CL 97* 96* 94*  CO2 21* 23 24  GLUCOSE 213* 147* 129*  BUN 51* 53* 52*  CREATININE 7.23* 6.63* 6.71*  CALCIUM 7.6* 7.7* 7.7*  PHOS 6.5* 6.6* 5.7*   CBC: Recent Labs  Lab 11/27/21 1828 11/28/21 0441 11/29/21 0631 11/30/21 0346 12/01/21 0133 12/02/21 0129 12/03/21 0124  WBC 12.0* 10.2 10.8* 10.2 7.6 15.1* 12.8*  NEUTROABS 9.6* 8.1*  --   --   --   --   --   HGB 8.2* 7.5* 9.0* 9.3* 8.1* 8.6* 8.6*  HCT 25.2* 22.5* 26.6* 28.2* 25.5* 27.0* 26.4*  MCV 105.4* 106.6* 102.3* 102.9* 106.3* 106.3* 105.2*  PLT 182 165 146* 143* 126* 149* 144*   Blood Culture    Component Value Date/Time   SDES  11/27/2021 1847    BLOOD RIGHT ANTECUBITAL Performed at [facility], [address], [place], [place] [address]    [facility]  11/27/2021 1847    Blood Culture adequate volume BOTTLES DRAWN AEROBIC AND ANAEROBIC Performed at [facility], [address], [place], [place] [address]    CULT  11/27/2021 1847    NO GROWTH 5 DAYS Performed at [facility], [place] [address], [place], [place] [address]    REPTSTATUS 12/02/2021 FINAL 11/27/2021 1847     Physical Exam General: Alert, lying bed, on nasal O2, nad  Heart: RRR, No m,r,g  Lungs: Scattered rhonci throughout  Abdomen: soft non-tender  Extremities: Trace LE edema  Dialysis Access: PD  cath in place   Medications:  dialysis solution 1.5% low-MG/low-CA     dialysis solution 2.5% low-MG/low-CA     piperacillin-tazobactam (ZOSYN)  IV 2.25 g (12/03/21 0934)    arformoterol  15 mcg Nebulization BID   calcitonin ([place])  1 spray Alternating Nares Daily   gentamicin cream  1 application Topical Daily   magnesium oxide  800 mg Oral BID   metoprolol succinate  12.5 mg Oral Daily   midodrine  20 mg Oral TID WC   pantoprazole  40 mg Oral BID AC   polyethylene glycol  17 g Oral Daily   revefenacin  175 mcg Nebulization Daily   sevelamer carbonate  800 mg Oral TID WC   vancomycin variable dose per unstable renal function (pharmacist dosing)   Does not apply See admin instructions    Dialysis Orders:  [facility] - 7x week 84.5kg, 6 exchanges, fill vol 3L last fill 1.5L Dwell time 1h 47min  Assessment/Plan: Multifocal pneumonia with loculated effusion. On IV antibiotics. Pulm consult s/p bronchoscopy 12/12 Adenocarcinoma of the lung on bronch . Oncology consulted/Palliative following.  ESRD - continue CCPD. Using 2.5% x 2, 1.5% x3  Hypokalemia -  supplement prn.  Anemia -Hgb  8.6, iron sat 53%, noted transfuse 1 unit 12/10.  Hold ESA in the setting of malignancy  Secondary hyperparathyroidism -Corr Ca ok. Continue Renvela binder/calcitriol  HTN/volume -hypotensive readings but asymp. BP Dropped on 12/12. BP  pre bronch,sec. to prob. anesth.meds //midodrine increased to 20 mg  tid, UF as tolerated, noted start midodrine 5 mg 3 PD(on 11/28/21 start) with patient having more p.o. intake , use 2.5 % x 2  and remainder 1.5 %  on PD ( NOTED NO URINE with Bladder scan this am ) A. fib/CHF with EF 30%= metoprolol stopped as outpatient secondary to low BP Eliquis on hold for procedure Chronic respiratory failure/COPD= on home O2 2 to 2.5 L,  History of L1 fracture 25%= plans per admit not a current candidate for kypho pain mod. controlled  RX per admit oxycodone/mobilize with PT/OT Dispo:  DNR.  Pt to discuss prognosis/options with oncology. Does not want to continue dialysis if prognosis is limited to months.   Lynnda Child PA-C [facility] 12/03/2021,11:13 AM

## 2021-12-04 ENCOUNTER — Encounter (HOSPITAL_COMMUNITY): Payer: Self-pay

## 2021-12-04 ENCOUNTER — Other Ambulatory Visit (HOSPITAL_COMMUNITY): Payer: Self-pay

## 2021-12-04 DIAGNOSIS — I959 Hypotension, unspecified: Secondary | ICD-10-CM

## 2021-12-04 LAB — RENAL FUNCTION PANEL
Albumin: 1.9 g/dL — ABNORMAL LOW (ref 3.5–5.0)
Anion gap: 11 (ref 5–15)
BUN: 47 mg/dL — ABNORMAL HIGH (ref 8–23)
CO2: 26 mmol/L (ref 22–32)
Calcium: 7.9 mg/dL — ABNORMAL LOW (ref 8.9–10.3)
Chloride: 96 mmol/L — ABNORMAL LOW (ref 98–111)
Creatinine, Ser: 6.33 mg/dL — ABNORMAL HIGH (ref 0.61–1.24)
GFR, Estimated: 8 mL/min — ABNORMAL LOW (ref >60)
Glucose, Bld: 134 mg/dL — ABNORMAL HIGH (ref 70–99)
Phosphorus: 5.2 mg/dL — ABNORMAL HIGH (ref 2.5–4.6)
Potassium: 3.2 mmol/L — ABNORMAL LOW (ref 3.5–5.1)
Sodium: 133 mmol/L — ABNORMAL LOW (ref 135–145)

## 2021-12-04 LAB — VANCOMYCIN, RANDOM: Vancomycin Rm: 27

## 2021-12-04 MED ORDER — DOXYCYCLINE HYCLATE 100 MG PO TABS
100.0000 mg | ORAL_TABLET | Freq: Two times a day (BID) | ORAL | Status: DC
  Administered 2021-12-04: 100 mg via ORAL
  Filled 2021-12-04: qty 1

## 2021-12-04 MED ORDER — MIDODRINE HCL 10 MG PO TABS
20.0000 mg | ORAL_TABLET | Freq: Three times a day (TID) | ORAL | 0 refills | Status: AC
  Filled 2021-12-04: qty 112, 19d supply, fill #0

## 2021-12-04 MED ORDER — POTASSIUM CHLORIDE CRYS ER 20 MEQ PO TBCR
40.0000 meq | EXTENDED_RELEASE_TABLET | Freq: Once | ORAL | Status: AC
  Administered 2021-12-04: 40 meq via ORAL
  Filled 2021-12-04: qty 2

## 2021-12-04 MED ORDER — OXYCODONE HCL 5 MG PO TABS
5.0000 mg | ORAL_TABLET | Freq: Four times a day (QID) | ORAL | 0 refills | Status: AC | PRN
  Filled 2021-12-04: qty 20, 5d supply, fill #0

## 2021-12-04 MED ORDER — DOXYCYCLINE HYCLATE 100 MG PO TABS
100.0000 mg | ORAL_TABLET | Freq: Two times a day (BID) | ORAL | 0 refills | Status: AC
  Filled 2021-12-04: qty 10, 5d supply, fill #0

## 2021-12-04 MED ORDER — AMIODARONE IV BOLUS ONLY 150 MG/100ML
150.0000 mg | Freq: Once | INTRAVENOUS | Status: AC
  Administered 2021-12-04: 150 mg via INTRAVENOUS
  Filled 2021-12-04: qty 100

## 2021-12-04 MED ORDER — AMIODARONE HCL 200 MG PO TABS
ORAL_TABLET | ORAL | 0 refills | Status: AC
  Filled 2021-12-04: qty 23, 37d supply, fill #0

## 2021-12-04 NOTE — Discharge Summary (Signed)
Physician Discharge Summary  David Barton QMG:[id number] DOB: 05-14-1933 DOA: 11/27/2021  PCP: Chesley Noon, MD  Admit date: 11/27/2021 Discharge date: 12/04/2021  Admitted From: Home Disposition:  Home   Recommendations for Outpatient Follow-up:  Follow up with PCP in 1-2 weeks Patient will be following with palliative as an outpatient, he is to be transition to hospice once appropriate  Home Health:YES Equipment/Devices: Already has oxygen, DME wheelchair.  Discharge Condition: Prognosis is poor, plan for discharge home with home health, with palliative care support, and patient to transition to hospice when he deteriorates.  CODE STATUS:DNR Diet recommendation: renal diet  Brief/Interim Summary:  Recurrent pneumonia, failed outpatient Augmentin  - This is most likely due to postobstructive pneumonia from lung malignancy -Treated with IV vancomycin and Zosyn, during hospital stay, he will be discharged on doxycycline for another 5 days as an outpatient(wife would like to avoid Augmentin as it gave patient diarrhea). -I have explained to the wife, patient likely will keep having recurrent pneumonia as this is the nature of postobstructive pneumonia from his malignancy. -Discussed with patient, to keep using incentive spirometer and flutter valve.   Metastatic adenocarcinoma of the lung -S/p bronchoscopy 12/12, with a EBUS and TBNA and lymph node biopsy, biopsy confirms adenocarcinoma. -Oncology input greatly appreciated, it is considered metastatic disease given involvement of lymph nodes, they have discussed with the patient/family, at this point decision for no chemotherapy, and to transition to hospice in outpatient setting when he deteriorates.     Diarrhea -resolved   Epistaxis on 11/26 Acute blood loss anemia Profuse epistaxis Thanksgiving evening ~ 8 hour Transfuse 1 U 12/10-hemoglobin in the 8-9 range-transfusion threshold is below 7   ESRD  -on peritoneal  dialysis, to continue at home   Hypokalemia -Replete as needed   Impaired glycemic control Recent steroid use  Permanent A. fib CHADS2 score >4 previously on Xarelto -Not on any heart rate controlling agent given soft blood pressure, he was started on low-dose metoprolol, but it has been discontinued on discharge given soft blood pressure. -He will be started on amiodarone on discharge. -Continue with Eliquis.    L1 fracture 25% Continue with as needed oxycodone for pain.  Hypotension -He is started on midodrine.  Goals of care -Prognosis is very poor, but patient wishes to continue dialysis for now, and he would like to go home, so he will be discharged home with maximum support can be provided at home, knowing his situation is very tenuous.  Elective medicine will be following as an outpatient, where he will be transitioned to hospice once appropriate.  Discharge Diagnoses:  Principal Problem:   Multifocal pneumonia Active Problems:   Hypotension   Essential hypertension   Chronic diastolic heart failure ([name])   Longstanding persistent atrial fibrillation ([name])   Chronic respiratory failure with hypoxia and hypercapnia ([name])   ESRD (end stage renal disease) ([facility])   DNR (do not resuscitate)   Chronic anticoagulation   GI bleed   Closed compression fracture of body of L1 vertebra [facility])    Discharge Instructions  Discharge Instructions     Diet - low sodium heart healthy   Complete by: As directed    Discharge instructions   Complete by: As directed    Follow with Primary MD Chesley Noon, MD in 7 days   Get CBC, CMP, checked  by Primary MD next visit.    Activity: As tolerated with Full fall precautions use walker/cane & assistance as needed  Disposition Home    Diet: Heart Healthy/renal diet , with feeding assistance and aspiration precautions.  For Heart failure patients - Check your Weight same time everyday, if you gain over 2 pounds, or you  develop in leg swelling, experience more shortness of breath or chest pain, call your Primary MD immediately. Follow Cardiac Low Salt Diet and 1.5 lit/day fluid restriction.   On your next visit with your primary care physician please Get Medicines reviewed and adjusted.   Please request your Prim.MD to go over all Hospital Tests and Procedure/Radiological results at the follow up, please get all Hospital records sent to your Prim MD by signing hospital release before you go home.   If you experience worsening of your admission symptoms, develop shortness of breath, life threatening emergency, suicidal or homicidal thoughts you must seek medical attention immediately by calling 911 or calling your MD immediately  if symptoms less severe.  You Must read complete instructions/literature along with all the possible adverse reactions/side effects for all the Medicines you take and that have been prescribed to you. Take any new Medicines after you have completely understood and accpet all the possible adverse reactions/side effects.   Do not drive, operating heavy machinery, perform activities at heights, swimming or participation in water activities or provide baby sitting services if your were admitted for syncope or siezures until you have seen by Primary MD or a Neurologist and advised to do so again.  Do not drive when taking Pain medications.    Do not take more than prescribed Pain, Sleep and Anxiety Medications  Special Instructions: If you have smoked or chewed Tobacco  in the last 2 yrs please stop smoking, stop any regular Alcohol  and or any Recreational drug use.  Wear Seat belts while driving.   Please note  You were cared for by a hospitalist during your hospital stay. If you have any questions about your discharge medications or the care you received while you were in the hospital after you are discharged, you can call the unit and asked to speak with the hospitalist on call if  the hospitalist that took care of you is not available. Once you are discharged, your primary care physician will handle any further medical issues. Please note that NO REFILLS for any discharge medications will be authorized once you are discharged, as it is imperative that you return to your primary care physician (or establish a relationship with a primary care physician if you do not have one) for your aftercare needs so that they can reassess your need for medications and monitor your lab values.   Increase activity slowly   Complete by: As directed    No wound care   Complete by: As directed       Allergies as of 12/04/2021       Reactions   Amlodipine Swelling   Tizanidine Other (See Comments)   Other reaction(s): hypotension        Medication List     TAKE these medications    acetaminophen 325 MG tablet Commonly known as: TYLENOL Take 650 mg by mouth every 6 (six) hours as needed for mild pain or moderate pain.   albuterol 108 (90 Base) MCG/ACT inhaler Commonly known as: ProAir HFA 2 puffs every 4 hours as needed only  if your can't catch your breath What changed:  how much to take how to take this when to take this reasons to take this additional instructions   amiodarone 100  MG tablet Commonly known as: Pacerone Take 2 tablets (200 mg total) by mouth daily for 7 days, THEN 1 tablet (100 mg total) daily. Start taking on: December 04, 2021   apixaban 2.5 MG Tabs tablet Commonly known as: Eliquis TAKE 1 TABLET(2.5 MG) BY MOUTH TWICE DAILY What changed:  how much to take how to take this when to take this   calcitRIOL 0.5 MCG capsule Commonly known as: ROCALTROL Take 0.5 mcg by mouth daily.   docusate sodium 100 MG capsule Commonly known as: COLACE Take 100 mg by mouth daily.   doxycycline 100 MG tablet Commonly known as: VIBRA-TABS Take 1 tablet (100 mg total) by mouth every 12 (twelve) hours.   famotidine 20 MG tablet Commonly known as:  Pepcid One after supper   levalbuterol 0.63 MG/3ML nebulizer solution Commonly known as: XOPENEX Take 3 mLs (0.63 mg total) by nebulization every 6 (six) hours as needed for wheezing or shortness of breath.   midodrine 10 MG tablet Commonly known as: PROAMATINE Take 2 tablets (20 mg total) by mouth 3 (three) times daily with meals.   multivitamin Tabs tablet Take 1 tablet by mouth at bedtime.   oxyCODONE 5 MG immediate release tablet Commonly known as: Oxy IR/ROXICODONE Take 1 tablet (5 mg total) by mouth every 6 (six) hours as needed for severe pain.   pantoprazole 40 MG tablet Commonly known as: Protonix Take 1 tablet (40 mg total) by mouth daily. Take 30-60 min before first meal of the day   polyethylene glycol powder 17 GM/SCOOP powder Commonly known as: GLYCOLAX/MIRALAX Take 17 g by mouth daily.   sevelamer carbonate 800 MG tablet Commonly known as: RENVELA Take 1 tablet (800 mg total) by mouth 3 (three) times daily with meals.   Trelegy Ellipta 100-62.5-25 MCG/ACT Aepb Generic drug: Fluticasone-Umeclidin-Vilant INHALE 1 PUFF INTO THE LUNGS DAILY   zolpidem 10 MG tablet Commonly known as: AMBIEN Take 10 mg by mouth at bedtime as needed for sleep.               Durable Medical Equipment  (From admission, onward)           Start     Ordered   12/04/21 1200  For home use only DME wheelchair cushion (seat and back)  Once        12/04/21 1200   12/04/21 1200  For home use only DME lightweight manual wheelchair with seat cushion  Once       Comments: Patient suffers from weakness which impairs their ability to perform daily activities like bathing, dressing, grooming, and toileting in the home.  A cane, crutch, or walker will not resolve  issue with performing activities of daily living. A wheelchair will allow patient to safely perform daily activities. Patient is not able to propel themselves in the home using a standard weight wheelchair due to general  weakness. Patient can self propel in the lightweight wheelchair. Length of need Lifetime. Accessories: elevating leg rests (ELRs), wheel locks, extensions and anti-tippers.   12/04/21 1200            Follow-up Information     Chesley Noon, MD Follow up.   Specialty: Family Medicine Contact information: [place] [address] [phone]         [facility], [facility] Follow up.   Specialty: Home Health Services Why: Your HH has been set up with [facility]. The office will contact you with start of service dated. If  you have any questions please call number listed above. Contact information: [address] [place] [address] [phone]                Allergies  Allergen Reactions   Amlodipine Swelling   Tizanidine Other (See Comments)    Other reaction(s): hypotension    Consultations: Oncology Palliaitve PCCM renal   Procedures/Studies: DG Chest 2 View  Result Date: 12/02/2021 CLINICAL DATA:  History of COPD EXAM: CHEST - 2 VIEW COMPARISON:  Chest x-ray 11/27/2021, CT 11/28/2021 FINDINGS: Emphysematous disease. Interval complete opacification of the right thorax, some shift of mediastinal contents to the right. Bilateral pleural effusions. Cardiomediastinal silhouette is obscured. No pneumothorax. IMPRESSION: 1. Interval complete opacification of the right thorax which may be due to combination of large pleural effusion and underlying airspace disease. Some shift of mediastinal contents to the right suggests a component of volume loss/atelectasis. 2. Small left pleural effusion 3. Emphysema Electronically Signed   By: Donavan Foil M.D.   On: 12/02/2021 16:31   DG Chest 2 View  Result Date: 11/27/2021 CLINICAL DATA:  Pleural effusion EXAM: CHEST - 2 VIEW COMPARISON:  11/13/2021 FINDINGS: Right lower lobe and to a lesser extent right upper lobe airspace disease concerning for multilobar pneumonia. Small right pleural  effusion. Mild left basilar atelectasis. No pneumothorax. Stable cardiomediastinal silhouette. No acute osseous abnormality. IMPRESSION: Right lower lobe and to a lesser extent right upper lobe airspace disease concerning for multilobar pneumonia. Electronically Signed   By: Kathreen Devoid M.D.   On: 11/27/2021 15:53   DG Chest 2 View  Result Date: 11/13/2021 CLINICAL DATA:  Productive cough. EXAM: CHEST - 2 VIEW COMPARISON:  October 22, 2021. FINDINGS: Stable cardiomediastinal silhouette. No pneumothorax is noted. Left lung is clear. Mild right pleural effusion is noted which is increased compared to prior exam. Perihilar and basilar opacity is noted concerning for pneumonia. Bony thorax is unremarkable. IMPRESSION: Right perihilar and basilar pneumonia is noted. Increased right pleural effusion. Electronically Signed   By: Marijo Conception M.D.   On: 11/13/2021 12:33   CT CHEST WO CONTRAST  Result Date: 11/28/2021 CLINICAL DATA:  Empyema. EXAM: CT CHEST WITHOUT CONTRAST TECHNIQUE: Multidetector CT imaging of the chest was performed following the standard protocol without IV contrast. COMPARISON:  May 20, 2019. FINDINGS: Cardiovascular: Atherosclerosis of thoracic aorta is noted without aneurysm formation. Normal cardiac size. No pericardial effusion. Moderate coronary artery calcifications are noted. Mediastinum/Nodes: Thyroid gland and esophagus are unremarkable. Enlarged mediastinal adenopathy is noted with 18 mm aortopulmonary window lymph node. 14 mm right paratracheal lymph node is noted. 15 mm left hilar lymph node is noted. Multiple enlarged right supraclavicular lymph nodes are noted as well. This may be inflammatory in etiology, but neoplasm cannot be excluded. Lungs/Pleura: No pneumothorax is noted. Emphysematous disease is noted. Large right upper lobe airspace opacity is noted consistent with pneumonia. Atelectasis of right lower lobe is noted. Moderate size complex and loculated right pleural  effusion is noted suspicious for empyema. Minimal left posterior basilar subsegmental atelectasis is noted. Upper Abdomen: No acute abnormality. Musculoskeletal: No chest wall mass or suspicious bone lesions identified. IMPRESSION: Moderate size complex and loculated right pleural effusion is noted suspicious for empyema. Large right upper lobe airspace opacity is noted consistent with pneumonia, with atelectasis of the right lower lobe. Multiple enlarged right supraclavicular right paratracheal left hilar and AP window lymph nodes are noted which may be inflammatory in etiology, but neoplasm cannot be excluded. Follow-up  is recommended. Moderate coronary artery calcifications are noted. Aortic Atherosclerosis (ICD10-I70.0) and Emphysema (ICD10-J43.9). Electronically Signed   By: Marijo Conception M.D.   On: 11/28/2021 11:54   CT Lumbar Spine Wo Contrast  Result Date: 11/27/2021 CLINICAL DATA:  Initial evaluation for acute low back pain, increased fracture risk. EXAM: CT LUMBAR SPINE WITHOUT CONTRAST TECHNIQUE: Multidetector CT imaging of the lumbar spine was performed without intravenous contrast administration. Multiplanar CT image reconstructions were also generated. COMPARISON:  None available. FINDINGS: Segmentation: Standard. Lowest well-formed disc space labeled the L5-S1 level. Alignment: Mild sigmoid scoliosis. Trace stepwise retrolisthesis of L2 on L3 through L4 on L5. Vertebrae: Acute to subacute compression fracture involving the superior endplate of L1 with up to 25% height loss without bony retropulsion. Vertebral body height otherwise maintained. Visualized sacrum and pelvis intact. SI joints symmetric and within normal limits. No discrete or worrisome osseous lesions. Paraspinal and other soft tissues: Mild paraspinous edema adjacent to the L1 fracture. Chronic fatty atrophy noted with throughout the posterior paraspinous and gluteal musculature. Advanced aorto bi-iliac atherosclerotic disease.  Right pleural effusion partially visualized. Disc levels: L1-2: Disc desiccation with mild disc bulge. Mild-to-moderate right worse than left facet hypertrophy. No spinal stenosis. Foramina remain patent. L2-3: Trace retrolisthesis with intervertebral disc space narrowing. Diffuse disc bulge without focal disc protrusion. Moderate facet hypertrophy. Mild bilateral lateral recess stenosis. Foramina remain patent. L3-4: Degenerative intervertebral disc space narrowing with retrolisthesis. Diffuse disc bulge with disc desiccation, eccentric to the right. Associated reactive endplate spurring. Moderate bilateral facet hypertrophy. Resultant mild canal with moderate bilateral lateral recess stenosis. Moderate bilateral L3 foraminal stenosis. L4-5: Degenerative intervertebral disc space narrowing with diffuse disc bulge and disc desiccation. Reactive endplate spurring. Moderate facet and ligament flavum hypertrophy. Moderate canal with bilateral subarticular stenosis. Moderate to severe bilateral L4 foraminal stenosis. L5-S1: Degenerative intervertebral disc space narrowing with diffuse disc bulge and disc desiccation. Reactive endplate spurring. Superimposed central to left subarticular disc protrusion with annular calcification (series 4, image 102). Protruding disc contacts and/or closely approximates both of the descending S1 nerve roots without frank impingement or displacement. Mild facet hypertrophy. No significant spinal stenosis. Moderate bilateral L5 foraminal narrowing. IMPRESSION: 1. Acute to subacute compression fracture involving the superior endplate of L1 with up to 25% height loss without bony retropulsion. 2. Multilevel degenerative spondylosis and facet arthrosis as above. Resultant mild to moderate canal and bilateral subarticular stenosis at L2-3 through L4-5, with moderate to severe bilateral L3 through L5 foraminal narrowing as above. 3. Right pleural effusion, partially visualized. 4. Aortic  Atherosclerosis (ICD10-I70.0). Electronically Signed   By: Jeannine Boga M.D.   On: 11/27/2021 19:48   ECHOCARDIOGRAM COMPLETE  Result Date: 11/11/2021    ECHOCARDIOGRAM REPORT   Patient Name:   AMAAD BYERS Date of Exam: 11/11/2021 Medical Rec #:  [id number]       Height:       70.0 in Accession #:    [id number]      Weight:       190.2 lb Date of Birth:  [birth year]      BSA:          2.043 m Patient Age:    87 years        BP:           100/64 mmHg Patient Gender: M               HR:           105 bpm. Exam  Location:  [address] Procedure: 2D Echo, Cardiac Doppler and Color Doppler Indications:    J90 Bilateral Pleural Effusion  History:        Patient has prior history of Echocardiogram examinations, most                 recent 05/21/2019. COPD; Risk Factors:Alcohol Abuse, Former Smoker                 and Hypertension. Emphysema of Lung. End Stage Renal Disease.  Sonographer:    Wilford Sports Rodgers-Jones RDCS Referring Phys: [place]  1. Left ventricular ejection fraction, by estimation, is 45 to 50%. The left ventricle has mildly decreased function. The left ventricle demonstrates global hypokinesis. Left ventricular diastolic function could not be evaluated.  2. Right ventricular systolic function is normal. The right ventricular size is normal.  3. The mitral valve is normal in structure. No evidence of mitral valve regurgitation. No evidence of mitral stenosis.  4. The aortic valve is calcified. There is mild calcification of the aortic valve. There is mild thickening of the aortic valve. Aortic valve regurgitation is not visualized. Mild aortic valve stenosis. FINDINGS  Left Ventricle: Left ventricular ejection fraction, by estimation, is 45 to 50%. The left ventricle has mildly decreased function. The left ventricle demonstrates global hypokinesis. The left ventricular internal cavity size was normal in size. There is  no left ventricular hypertrophy. Left ventricular  diastolic function could not be evaluated due to atrial fibrillation. Left ventricular diastolic function could not be evaluated. Right Ventricle: The right ventricular size is normal. Right vetricular wall thickness was not well visualized. Right ventricular systolic function is normal. Left Atrium: Left atrial size was normal in size. Right Atrium: Right atrial size was normal in size. Pericardium: There is no evidence of pericardial effusion. Mitral Valve: The mitral valve is normal in structure. No evidence of mitral valve regurgitation. No evidence of mitral valve stenosis. Tricuspid Valve: The tricuspid valve is normal in structure. Tricuspid valve regurgitation is mild. Aortic Valve: The aortic valve is calcified. There is mild calcification of the aortic valve. There is mild thickening of the aortic valve. There is mild aortic valve annular calcification. Aortic valve regurgitation is not visualized. Mild aortic stenosis is present. Pulmonic Valve: The pulmonic valve was grossly normal. Pulmonic valve regurgitation is not visualized. Aorta: The aortic root and ascending aorta are structurally normal, with no evidence of dilitation. IAS/Shunts: The interatrial septum was not well visualized.  LEFT VENTRICLE PLAX 2D LVIDd:         4.20 cm LVIDs:         3.10 cm LV PW:         1.10 cm LV IVS:        0.90 cm LVOT diam:     2.20 cm LV SV:         38 LV SV Index:   19 LVOT Area:     3.80 cm  RIGHT VENTRICLE RV Basal diam:  4.70 cm LEFT ATRIUM             Index        RIGHT ATRIUM           Index LA diam:        4.50 cm 2.20 cm/m   RA Area:     15.60 cm LA Vol (A2C):   26.8 ml 13.12 ml/m  RA Volume:   35.40 ml  17.33 ml/m LA Vol (A4C):  33.3 ml 16.30 ml/m LA Biplane Vol: 30.7 ml 15.03 ml/m  AORTIC VALVE LVOT Vmax:   68.15 cm/s LVOT Vmean:  45.050 cm/s LVOT VTI:    0.100 m  AORTA Ao Root diam: 3.60 cm Ao Asc diam:  3.30 cm MV E velocity: 97.42 cm/s  TRICUSPID VALVE                            TR Peak grad:    20.8 mmHg                            TR Vmax:        228.00 cm/s                             SHUNTS                            Systemic VTI:  0.10 m                            Systemic Diam: 2.20 cm Mertie Moores MD Electronically signed by Mertie Moores MD Signature Date/Time: 11/11/2021/3:54:25 PM    Final    LONG TERM MONITOR (3-14 DAYS)  Result Date: 11/18/2021 Patch Wear Time:  4 days and 20 hours (2022-11-13T16:22:08-499 to 2022-11-18T13:13:16-0500) 7 Ventricular Tachycardia runs occurred, the run with the fastest interval lasting 4 beats with a max rate of 176 bpm, the longest lasting 5 beats with an avg rate of 144 bpm. Atrial Fibrillation occurred continuously (100% burden), ranging from 70-171 bpm (avg of 108 bpm). Isolated VEs were occasional (3.0%, 19281), VE Couplets were rare (<1.0%, 2054), and VE Triplets were rare (<1.0%, 94). Ventricular Bigeminy and Trigeminy were present. 1.  Persistent atrial fibrillation 2.  Frequent PVCs     Subjective: Frail, he denies any chest pain, still reports cough, and weakness.  Discharge Exam: Vitals:   12/04/21 1200 12/04/21 1402  BP: (!) 98/53 (!) 84/63  Pulse: (!) 102 (!) 105  Resp: 20 20  Temp: 97.7 F (36.5 C) 97.6 F (36.4 C)  SpO2: 93% 90%   Vitals:   12/04/21 1000 12/04/21 1004 12/04/21 1200 12/04/21 1402  BP: 90/60  (!) 98/53 (!) 84/63  Pulse: (!) 102 100 (!) 102 (!) 105  Resp:  20 20 20   Temp:  97.7 F (36.5 C) 97.7 F (36.5 C) 97.6 F (36.4 C)  TempSrc:  Oral Oral Oral  SpO2:  93% 93% 90%  Weight:  79.2 kg    Height:        General: Pt is alert, awake, Trembly frail, deconditioned Cardiovascular: iregular irregular, mildly tachycardic, S1/S2 +, no rubs, no gallops Respiratory: CTA bilaterally, no wheezing, scattered rhonchi Abdominal: Soft, NT, ND, bowel sounds + Extremities: no edema, no cyanosis    The results of significant diagnostics from this hospitalization (including imaging, microbiology, ancillary and  laboratory) are listed below for reference.     Microbiology: Recent Results (from the past 240 hour(s))  Culture, blood (routine x 2)     Status: None   Collection Time: 11/27/21  6:45 PM   Specimen: BLOOD  Result Value Ref Range Status   Specimen Description   Final    BLOOD LEFT ANTECUBITAL Performed at [facility], [address],  [place], [place] [address]    Special Requests   Final    Blood Culture adequate volume BOTTLES DRAWN AEROBIC AND ANAEROBIC Performed at [facility], [address], [place], [place] [address]    Culture   Final    NO GROWTH 5 DAYS Performed at [facility], [place] [address], [place], [place] [address]    Report Status 12/02/2021 FINAL  Final  Culture, blood (routine x 2)     Status: None   Collection Time: 11/27/21  6:47 PM   Specimen: BLOOD  Result Value Ref Range Status   Specimen Description   Final    BLOOD RIGHT ANTECUBITAL Performed at [facility], [address] [place], [place] [address]    Special Requests   Final    Blood Culture adequate volume BOTTLES DRAWN AEROBIC AND ANAEROBIC Performed at [facility], [address] [place], [place] [address]    Culture   Final    NO GROWTH 5 DAYS Performed at [facility], [place] [address], [place], [place] [address]    Report Status 12/02/2021 FINAL  Final  Resp Panel by RT-PCR (Flu A&B, Covid) Peripheral     Status: None   Collection Time: 11/27/21  6:53 PM   Specimen: Peripheral; Nasopharyngeal(NP) swabs in vial transport medium  Result Value Ref Range Status   SARS Coronavirus 2 by RT PCR NEGATIVE NEGATIVE Final    Comment: (NOTE) SARS-CoV-2 target nucleic acids are NOT DETECTED.  The SARS-CoV-2 RNA is generally detectable in upper respiratory specimens during the acute phase of infection. The lowest concentration of SARS-CoV-2 viral copies this assay can detect is 138 copies/mL. A negative  result does not preclude SARS-Cov-2 infection and should not be used as the sole basis for treatment or other patient management decisions. A negative result may occur with  improper specimen collection/handling, submission of specimen other than nasopharyngeal swab, presence of viral mutation(s) within the areas targeted by this assay, and inadequate number of viral copies(<138 copies/mL). A negative result must be combined with clinical observations, patient history, and epidemiological information. The expected result is Negative.  Fact Sheet for Patients:  EntrepreneurPulse.com.au  Fact Sheet for Healthcare Providers:  IncredibleEmployment.be  This test is no t yet approved or cleared by the [place] FDA and  has been authorized for detection and/or diagnosis of SARS-CoV-2 by FDA under an Emergency Use Authorization (EUA). This EUA will remain  in effect (meaning this test can be used) for the duration of the COVID-19 declaration under Section 564(b)(1) of the Act, 21 U.S.C.section 360bbb-3(b)(1), unless the authorization is terminated  or revoked sooner.       Influenza A by PCR NEGATIVE NEGATIVE Final   Influenza B by PCR NEGATIVE NEGATIVE Final    Comment: (NOTE) The Xpert Xpress SARS-CoV-2/FLU/RSV plus assay is intended as an aid in the diagnosis of influenza from Nasopharyngeal swab specimens and should not be used as a sole basis for treatment. Nasal washings and aspirates are unacceptable for Xpert Xpress SARS-CoV-2/FLU/RSV testing.  Fact Sheet for Patients: EntrepreneurPulse.com.au  Fact Sheet for Healthcare Providers: IncredibleEmployment.be  This test is not yet approved or cleared by the [place] FDA and has been authorized for detection and/or diagnosis of SARS-CoV-2 by FDA under an Emergency Use Authorization (EUA). This EUA will remain in effect (meaning this test can be used)  for the duration of the COVID-19 declaration under Section 564(b)(1) of the Act, 21 U.S.C. section 360bbb-3(b)(1), unless the authorization  is terminated or revoked.  Performed at [facility], [address], [place], [place] [address]      Labs: BNP (last 3 results) No results for input(s): BNP in the last 8760 hours. Basic Metabolic Panel: Recent Labs  Lab 11/29/21 2109 11/30/21 0346 12/01/21 0133 12/02/21 0129 12/03/21 0124 12/04/21 0142  NA 135 134* 133* 133* 134* 133*  K 3.7 3.8 3.7 3.5 3.3* 3.2*  CL 96* 95* 97* 96* 94* 96*  CO2 24 26 21* 23 24 26   GLUCOSE 131* 108* 213* 147* 129* 134*  BUN 58* 54* 51* 53* 52* 47*  CREATININE 8.13* 7.89* 7.23* 6.63* 6.71* 6.33*  CALCIUM 8.1* 7.9* 7.6* 7.7* 7.7* 7.9*  MG 1.7 1.7 1.6* 1.8 2.0  --   PHOS  --  6.2* 6.5* 6.6* 5.7* 5.2*   Liver Function Tests: Recent Labs  Lab 11/27/21 1828 11/28/21 0441 11/29/21 0631 11/30/21 0346 12/01/21 0133 12/02/21 0129 12/03/21 0124 12/04/21 0142  AST 21 23  --   --   --  20  --   --   ALT 10 13  --   --   --  11  --   --   ALKPHOS 72 70  --   --   --  73  --   --   BILITOT 0.5 1.1  --   --   --  0.8  --   --   PROT 6.0* 5.0*  --   --   --  4.9*  --   --   ALBUMIN 3.1* 2.0*   < > 1.8* 1.8* 1.8* 1.8* 1.9*   < > = values in this interval not displayed.   No results for input(s): LIPASE, AMYLASE in the last 168 hours. No results for input(s): AMMONIA in the last 168 hours. CBC: Recent Labs  Lab 11/27/21 1828 11/28/21 0441 11/29/21 0631 11/30/21 0346 12/01/21 0133 12/02/21 0129 12/03/21 0124  WBC 12.0* 10.2 10.8* 10.2 7.6 15.1* 12.8*  NEUTROABS 9.6* 8.1*  --   --   --   --   --   HGB 8.2* 7.5* 9.0* 9.3* 8.1* 8.6* 8.6*  HCT 25.2* 22.5* 26.6* 28.2* 25.5* 27.0* 26.4*  MCV 105.4* 106.6* 102.3* 102.9* 106.3* 106.3* 105.2*  PLT 182 165 146* 143* 126* 149* 144*   Cardiac Enzymes: No results for input(s): CKTOTAL, CKMB, CKMBINDEX, TROPONINI in the last 168  hours. BNP: Invalid input(s): POCBNP CBG: No results for input(s): GLUCAP in the last 168 hours. D-Dimer No results for input(s): DDIMER in the last 72 hours. Hgb A1c No results for input(s): HGBA1C in the last 72 hours. Lipid Profile No results for input(s): CHOL, HDL, LDLCALC, TRIG, CHOLHDL, LDLDIRECT in the last 72 hours. Thyroid function studies No results for input(s): TSH, T4TOTAL, T3FREE, THYROIDAB in the last 72 hours.  Invalid input(s): FREET3 Anemia work up No results for input(s): VITAMINB12, FOLATE, FERRITIN, TIBC, IRON, RETICCTPCT in the last 72 hours. Urinalysis    Component Value Date/Time   COLORURINE YELLOW 06/03/2019 1022   APPEARANCEUR HAZY (A) 06/03/2019 1022   LABSPEC 1.014 06/03/2019 1022   PHURINE 5.0 06/03/2019 1022   GLUCOSEU NEGATIVE 06/03/2019 1022   HGBUR NEGATIVE 06/03/2019 1022   BILIRUBINUR NEGATIVE 06/03/2019 1022   KETONESUR NEGATIVE 06/03/2019 1022   PROTEINUR 30 (A) 06/03/2019 1022   UROBILINOGEN 1.0 05/08/2013 1738   NITRITE NEGATIVE 06/03/2019 1022   LEUKOCYTESUR NEGATIVE 06/03/2019 1022   Sepsis Labs Invalid input(s): PROCALCITONIN,  WBC,  LACTICIDVEN Microbiology Recent  Results (from the past 240 hour(s))  Culture, blood (routine x 2)     Status: None   Collection Time: 11/27/21  6:45 PM   Specimen: BLOOD  Result Value Ref Range Status   Specimen Description   Final    BLOOD LEFT ANTECUBITAL Performed at [facility], [address] [place]  [address]    Special Requests   Final    Blood Culture adequate volume BOTTLES DRAWN AEROBIC AND ANAEROBIC Performed at [facility], [address], [place], [place] [address]    Culture   Final    NO GROWTH 5 DAYS Performed at [facility], [place] [address] [place], [place] [address]    Report Status 12/02/2021 FINAL  Final  Culture, blood (routine x 2)     Status: None   Collection Time: 11/27/21  6:47 PM   Specimen: BLOOD   Result Value Ref Range Status   Specimen Description   Final    BLOOD RIGHT ANTECUBITAL Performed at [facility], [address], [place], [place] [address]    Special Requests   Final    Blood Culture adequate volume BOTTLES DRAWN AEROBIC AND ANAEROBIC Performed at [facility], [address] [place], [place] [address]    Culture   Final    NO GROWTH 5 DAYS Performed at [facility], [place] [address], [place], [place] [address]    Report Status 12/02/2021 FINAL  Final  Resp Panel by RT-PCR (Flu A&B, Covid) Peripheral     Status: None   Collection Time: 11/27/21  6:53 PM   Specimen: Peripheral; Nasopharyngeal(NP) swabs in vial transport medium  Result Value Ref Range Status   SARS Coronavirus 2 by RT PCR NEGATIVE NEGATIVE Final    Comment: (NOTE) SARS-CoV-2 target nucleic acids are NOT DETECTED.  The SARS-CoV-2 RNA is generally detectable in upper respiratory specimens during the acute phase of infection. The lowest concentration of SARS-CoV-2 viral copies this assay can detect is 138 copies/mL. A negative result does not preclude SARS-Cov-2 infection and should not be used as the sole basis for treatment or other patient management decisions. A negative result may occur with  improper specimen collection/handling, submission of specimen other than nasopharyngeal swab, presence of viral mutation(s) within the areas targeted by this assay, and inadequate number of viral copies(<138 copies/mL). A negative result must be combined with clinical observations, patient history, and epidemiological information. The expected result is Negative.  Fact Sheet for Patients:  EntrepreneurPulse.com.au  Fact Sheet for Healthcare Providers:  IncredibleEmployment.be  This test is no t yet approved or cleared by the [place] FDA and  has been authorized for detection and/or diagnosis of SARS-CoV-2 by FDA  under an Emergency Use Authorization (EUA). This EUA will remain  in effect (meaning this test can be used) for the duration of the COVID-19 declaration under Section 564(b)(1) of the Act, 21 U.S.C.section 360bbb-3(b)(1), unless the authorization is terminated  or revoked sooner.       Influenza A by PCR NEGATIVE NEGATIVE Final   Influenza B by PCR NEGATIVE NEGATIVE Final    Comment: (NOTE) The Xpert Xpress SARS-CoV-2/FLU/RSV plus assay is intended as an aid in the diagnosis of influenza from Nasopharyngeal swab specimens and should not be used as a sole basis for treatment. Nasal washings and aspirates are unacceptable for Xpert Xpress SARS-CoV-2/FLU/RSV testing.  Fact Sheet for Patients: EntrepreneurPulse.com.au  Fact Sheet for Healthcare Providers: IncredibleEmployment.be  This test is not yet  approved or cleared by the [place] and has been authorized for detection and/or diagnosis of SARS-CoV-2 by FDA under an Emergency Use Authorization (EUA). This EUA will remain in effect (meaning this test can be used) for the duration of the COVID-19 declaration under Section 564(b)(1) of the Act, 21 U.S.C. section 360bbb-3(b)(1), unless the authorization is terminated or revoked.  Performed at [facility], [address] [place], [place] [address]      Time coordinating discharge: Over 30 minutes  SIGNED:   Phillips Climes, MD  [facility] 12/04/2021, 2:21 PM Pager   If 7PM-7AM, please contact night-coverage [email or web address] Password [email or web address]

## 2021-12-04 NOTE — Progress Notes (Signed)
[facility]) Hospital Liaison note:  Notified of request for Amber services.   Will continue to follow for disposition.  Please call with any outpatient palliative questions or concerns.  Thank you for the opportunity to participate in this patient's care.  Thank you, Lorelee Market, LPN [facility] Liaison [phone]

## 2021-12-04 NOTE — Progress Notes (Signed)
Physical Therapy Treatment Patient Details Name: David Barton MRN: [id number] DOB: 1933/03/08 Today's Date: 12/04/2021   History of Present Illness 87 year old male  presents to the ER 11/28/21 from the pulmonary office.  Patient was sent to the ER due to pneumonia seen on chest x-ray. hypotensive; compression fx L1;   PMH-- end-stage renal disease on peritoneal dialysis, COPD, recurrent pneumonia, A. fib on anticoagulation, colon Ca, HTN    PT Comments    Pt received poor prognosis from oncologist, and has now chosen to d/c home. He is currently not eligible for hospice due to wanting to continue with PD. Regarding mobility, pt requires mod assist bed mobility, mod assist sit to stand, and min assist SPT. Pt on 4L O2 with desat to 88% during activity, 2/4 DOE. He currently has an adjustable frame bed and RW at home.  Recommending BSC and wheelchair upon d/c. Pt does have 4 steps to enter his house. He reports his son will be helping him home at discharge and is confident they can manage the steps.   Recommendations for follow up therapy are one component of a multi-disciplinary discharge planning process, led by the attending physician.  Recommendations may be updated based on patient status, additional functional criteria and insurance authorization.  Follow Up Recommendations  Skilled nursing-short term rehab (<3 hours/day) (Pt declining. Plan for d/c home with HHPT.)     Assistance Recommended at Discharge Frequent or constant Supervision/Assistance  Equipment Recommendations  Wheelchair (measurements PT);Wheelchair cushion (measurements PT);BSC/3in1    Recommendations for Other Services       Precautions / Restrictions Precautions Precautions: Fall;Back Precaution Comments: Reviewed back precautions and log roll technique; pt refuses to logroll     Mobility  Bed Mobility Overal bed mobility: Needs Assistance Bed Mobility: Supine to Sit     Supine to sit: Mod assist;HOB  elevated     General bed mobility comments: +rail, increased time    Transfers Overall transfer level: Needs assistance Equipment used: 1 person hand held assist Transfers: Sit to/from Stand;Bed to chair/wheelchair/BSC Sit to Stand: Mod assist     Step pivot transfers: Min assist     General transfer comment: mod assist to power up to stand, min assist to stabilize balance for pivot steps bed to recliner    Ambulation/Gait                   Stairs             Wheelchair Mobility    Modified Rankin (Stroke Patients Only)       Balance Overall balance assessment: Needs assistance Sitting-balance support: No upper extremity supported;Feet supported Sitting balance-Leahy Scale: Fair     Standing balance support: Bilateral upper extremity supported;During functional activity Standing balance-Leahy Scale: Poor Standing balance comment: reliant on external assist                            Cognition Arousal/Alertness: Awake/alert Behavior During Therapy: WFL for tasks assessed/performed Overall Cognitive Status: Within Functional Limits for tasks assessed                                          Exercises      General Comments General comments (skin integrity, edema, etc.): Pt on 4L O2. SpO2 93% at rest. Desat to 88% during activity. 2/4 DOE with  minimal activity.      Pertinent Vitals/Pain Pain Assessment: Faces Faces Pain Scale: Hurts little more Pain Location: Back Pain Descriptors / Indicators: Grimacing;Guarding;Discomfort Pain Intervention(s): Monitored during session;Repositioned    Home Living                          Prior Function            PT Goals (current goals can now be found in the care plan section) Acute Rehab PT Goals Patient Stated Goal: home Progress towards PT goals: Progressing toward goals    Frequency    Min 3X/week      PT Plan Discharge plan needs to be updated     Co-evaluation              AM-PAC PT "6 Clicks" Mobility   Outcome Measure  Help needed turning from your back to your side while in a flat bed without using bedrails?: A Little Help needed moving from lying on your back to sitting on the side of a flat bed without using bedrails?: A Lot Help needed moving to and from a bed to a chair (including a wheelchair)?: A Little Help needed standing up from a chair using your arms (e.g., wheelchair or bedside chair)?: A Lot Help needed to walk in hospital room?: Total Help needed climbing 3-5 steps with a railing? : Total 6 Click Score: 12    End of Session Equipment Utilized During Treatment: Oxygen;Gait belt Activity Tolerance: Patient limited by fatigue Patient left: in chair;with call bell/phone within reach Nurse Communication: Mobility status PT Visit Diagnosis: Difficulty in walking, not elsewhere classified (R26.2);Pain     Time: 7412-8786 PT Time Calculation (min) (ACUTE ONLY): 25 min  Charges:  $Gait Training: 8-22 mins $Therapeutic Activity: 8-22 mins                     David Barton, PT  Office # [phone] Pager [phone]    Lorriane Shire 12/04/2021, 11:42 AM

## 2021-12-04 NOTE — Progress Notes (Signed)
°   12/04/21 1000  Assess: MEWS Score  BP 90/60  Pulse Rate (!) 102  Assess: MEWS Score  MEWS Temp 0  MEWS Systolic 1  MEWS Pulse 1  MEWS RR 0  MEWS LOC 0  MEWS Score 2  MEWS Score Color Yellow  Assess: if the MEWS score is Yellow or Red  Were vital signs taken at a resting state? Yes  Focused Assessment No change from prior assessment  MEWS guidelines implemented *See Row Information* Yes  Treat  MEWS Interventions Administered scheduled meds/treatments  Pain Scale 0-10  Pain Score 0  Take Vital Signs  Increase Vital Sign Frequency  Yellow: Q 2hr X 2 then Q 4hr X 2, if remains yellow, continue Q 4hrs  Escalate  MEWS: Escalate Yellow: discuss with charge nurse/RN and consider discussing with provider and RRT  Notify: Charge Nurse/RN  Name of Charge Nurse/RN Notified Desiray, RN  Date Charge Nurse/RN Notified 12/04/21  Time Charge Nurse/RN Notified 1000  Notify: Provider  Provider Name/Title Izora Gala, MD  Date Provider Notified 12/04/21  Time Provider Notified 1000  Notification Type Page  Provider response  (Compression stockings. Pt on large dose of midrone)  Date of Provider Response 12/04/21  Time of Provider Response 1000

## 2021-12-04 NOTE — Discharge Instructions (Addendum)
Follow with Primary MD Chesley Noon, MD in 7 days   Get CBC, CMP, checked  by Primary MD next visit.    Activity: As tolerated with Full fall precautions use walker/cane & assistance as needed   Disposition Home    Diet: Heart Healthy/renal diet , with feeding assistance and aspiration precautions.  For Heart failure patients - Check your Weight same time everyday, if you gain over 2 pounds, or you develop in leg swelling, experience more shortness of breath or chest pain, call your Primary MD immediately. Follow Cardiac Low Salt Diet and 1.5 lit/day fluid restriction.   On your next visit with your primary care physician please Get Medicines reviewed and adjusted.   Please request your Prim.MD to go over all Hospital Tests and Procedure/Radiological results at the follow up, please get all Hospital records sent to your Prim MD by signing hospital release before you go home.   If you experience worsening of your admission symptoms, develop shortness of breath, life threatening emergency, suicidal or homicidal thoughts you must seek medical attention immediately by calling 911 or calling your MD immediately  if symptoms less severe.  You Must read complete instructions/literature along with all the possible adverse reactions/side effects for all the Medicines you take and that have been prescribed to you. Take any new Medicines after you have completely understood and accpet all the possible adverse reactions/side effects.   Do not drive, operating heavy machinery, perform activities at heights, swimming or participation in water activities or provide baby sitting services if your were admitted for syncope or siezures until you have seen by Primary MD or a Neurologist and advised to do so again.  Do not drive when taking Pain medications.    Do not take more than prescribed Pain, Sleep and Anxiety Medications  Special Instructions: If you have smoked or chewed Tobacco  in the  last 2 yrs please stop smoking, stop any regular Alcohol  and or any Recreational drug use.  Wear Seat belts while driving.   Please note  You were cared for by a hospitalist during your hospital stay. If you have any questions about your discharge medications or the care you received while you were in the hospital after you are discharged, you can call the unit and asked to speak with the hospitalist on call if the hospitalist that took care of you is not available. Once you are discharged, your primary care physician will handle any further medical issues. Please note that NO REFILLS for any discharge medications will be authorized once you are discharged, as it is imperative that you return to your primary care physician (or establish a relationship with a primary care physician if you do not have one) for your aftercare needs so that they can reassess your need for medications and monitor your lab values.

## 2021-12-04 NOTE — Progress Notes (Signed)
Pharmacy Antibiotic Note- Follow-up  David Barton is a 87 y.o. male admitted on 11/27/2021 with pneumonia.  Pharmacy has been consulted for vancomycin dosing.   Height: 5\' 10"  (177.8 cm) Weight: 86.6 kg (190 lb 14.7 oz) IBW/kg (Calculated) : 73  Temp (24hrs), Avg:97.8 F (36.6 C), Min:97.3 F (36.3 C), Max:98.6 F (37 C)  Recent Labs  Lab 11/27/21 1828 11/28/21 0441 11/28/21 0441 11/29/21 0631 11/29/21 2109 11/30/21 0346 12/01/21 0133 12/01/21 1547 12/02/21 0129 12/03/21 0124 12/04/21 0142  WBC 12.0* 10.2  --  10.8*  --  10.2 7.6  --  15.1* 12.8*  --   CREATININE 7.24* 7.99*  --  7.67*   < > 7.89* 7.23*  --  6.63* 6.71* 6.33*  LATICACIDVEN 1.3 2.3*  --   --   --   --   --   --   --   --   --   VANCORANDOM  --   --    < > 20  --   --   --  21  --   --  27   < > = values in this interval not displayed.     Estimated Creatinine Clearance: 8.8 mL/min (A) (by C-G formula based on SCr of 6.33 mg/dL (H)).    Allergies  Allergen Reactions   Amlodipine Swelling   Tizanidine Other (See Comments)    Other reaction(s): hypotension    Last vanco random was 27 mcg/mL on 12/04/2021 at 01:42  Re-draw a vancomycin random level with AM labs on 12/04/2021 and re-dose as appropriate  Thank you for allowing pharmacy to be a part of this patients care.  Vaughan Basta BS, PharmD, BCPS Clinical Pharmacist 12/04/2021 9:41 AM

## 2021-12-04 NOTE — Progress Notes (Addendum)
Daily Progress Note   Patient Name: David Barton       Date: 12/04/2021 DOB: Mar 12, 1933  Age: 87 y.o. MRN#: [id number] Attending Physician: Albertine Patricia, MD Primary Care Physician: Chesley Noon, MD Admit Date: 11/27/2021  Reason for Consultation/Follow-up: Establishing goals of care  Subjective: Patient on PD he endorses no concerns.  Length of Stay: 6  Current Medications: Scheduled Meds:   apixaban  2.5 mg Oral BID   arformoterol  15 mcg Nebulization BID   calcitonin (salmon)  1 spray Alternating Nares Daily   doxycycline  100 mg Oral Q12H   gentamicin cream  1 application Topical Daily   magnesium oxide  800 mg Oral BID   metoprolol succinate  12.5 mg Oral Daily   midodrine  20 mg Oral TID WC   pantoprazole  40 mg Oral BID AC   polyethylene glycol  17 g Oral Daily   potassium chloride  40 mEq Oral Once   revefenacin  175 mcg Nebulization Daily   sevelamer carbonate  800 mg Oral TID WC    Continuous Infusions:  dialysis solution 1.5% low-MG/low-CA     dialysis solution 2.5% low-MG/low-CA      PRN Meds: acetaminophen **OR** acetaminophen, heparin, ipratropium-albuterol, midodrine, ondansetron **OR** ondansetron (ZOFRAN) IV, oxyCODONE  Physical Exam Constitutional:      General: He is not in acute distress. Pulmonary:     Effort: Pulmonary effort is normal.     Comments: Remains on [place] Skin:    General: Skin is warm and dry.  Neurological:     Mental Status: He is alert and oriented to person, place, and time.            Vital Signs: BP 90/60    Pulse 100    Temp 97.7 F (36.5 C) (Oral)    Resp 20    Ht _0  (1.778 m)    Wt 79.2 kg    SpO2 93%    BMI 25.05 kg/m  SpO2: SpO2: 93 % O2 Device: O2 Device: Nasal Cannula O2 Flow Rate: O2 Flow Rate (L/min): 4  L/min  Intake/output summary:  Intake/Output Summary (Last 24 hours) at 12/04/2021 1108 Last data filed at 12/04/2021 1006 Gross per 24 hour  Intake 18380 ml  Output 18492 ml  Net -112 ml    LBM: Last BM Date: 12/04/21 Baseline Weight: Weight: 84.4 kg Most recent weight: Weight: 79.2 kg  Palliative Assessment/Data: PPS 50%    Flowsheet Rows    Flowsheet Row Most Recent Value  Intake Tab   Referral Department Hospitalist  Unit at Time of Referral Intermediate Care Unit  Palliative Care Primary Diagnosis Cancer  Date Notified 12/02/21  Palliative Care Type New Palliative care  Reason for referral Clarify Goals of Care  Date of Admission 11/27/21  Date first seen by Palliative Care 12/02/21  # of days Palliative referral response time 0 Day(s)  # of days IP prior to Palliative referral 5  Clinical Assessment   Psychosocial & Spiritual Assessment   Palliative Care Outcomes        Patient Active Problem List   Diagnosis Date Noted   GI bleed 11/28/2021   Closed compression fracture  of body of L1 vertebra ([place]) 11/28/2021   Multifocal pneumonia 11/27/2021   Community acquired pneumonia of right lung 11/13/2021   Medication management 10/17/2020   Healthcare maintenance 10/17/2020   Musculoskeletal back pain 09/17/2020   Chronic anticoagulation 07/11/2019   Chronic bilateral pleural effusions  R>L 06/28/2019   Macrocytic anemia 06/07/2019   Lung infiltrate    Shortness of breath    DNR (do not resuscitate)    Palliative care by specialist    S/P thoracentesis    SOB (shortness of breath)    Leukocytosis    ESRD (end stage renal disease) ([place])    Acute on chronic combined systolic and diastolic heart failure ([place])    COPD GOLD II     Weakness generalized 05/30/2019   Acute on chronic respiratory failure ([place]) 05/19/2019   Chronic respiratory failure with hypoxia and hypercapnia ([place]) 05/06/2019   DOE (dyspnea on exertion) 05/04/2019   Chronic respiratory  failure with hypoxia ([place]) 04/25/2019   History of anemia due to chronic kidney disease 04/17/2019   Pressure injury of skin 03/17/2019   Longstanding persistent atrial fibrillation ([place]) 03/11/2019   Chronic diastolic heart failure ([place]) 01/17/2019   Edema of both legs 01/02/2019   Pulmonary edema    Sleep apnea    Elevated troponin 11/10/2016   Cardiomyopathy ([place]) 11/10/2016   PSVT (paroxysmal supraventricular tachycardia) ([place]) 11/10/2016   Dysphagia    Aspiration pneumonia ([place]) 11/04/2016   Diarrhea 11/04/2016   Essential hypertension 05/31/2013   COPD with acute exacerbation ([place]) 05/29/2013   Hypokalemia 05/08/2013   Hypotension 05/08/2013   Dizziness 05/08/2013   Hypoxia 05/08/2013   Acute respiratory failure ([place]) 05/08/2013    Palliative Care Assessment & Plan   HPI: 87 y.o. male  with past medical history of COPD, a fib, HFrEF, colon cancer, c diff, htn, and ESRD on PD admitted on 11/27/2021 with multilobar pna - failed outpatient treatment. Concern for postobstructive pna - had bronch 12/12, concerning for malignancy, awaiting biopsies. Continues on PD with no complications. Has some hypotension. Also with L1 fracture.  PMT consulted for [place].   Assessment: I spoke to [place] this morning.  He shares that he is ready to discharge home.  We reviewed that he met with Dr. Alvy Bimler such last night and his plan at this time is to go home with physical therapy to optimize his strength.  He expresses that when he is ready to stop peritoneal dialysis he will pursue hospice care.  I shared that I will request outpatient palliative support follow-up with him in the event that he would like to transition sooner than later.  Recommendations/Plan: DNAR/DNI Plan to discharge home with PT/OT TOC - OP Palliative support When patient is ready to stop PD he will alert hospice Patient hopes to discharge sooner than later  Code Status: DNR  Discharge Planning: To Be Determined  Care  plan was discussed with patient  Thank you for allowing the Palliative Medicine Team to assist in the care of this patient.  Total Time 35 minutes Prolonged Time Billed  no    Greater than 50%  of this time was spent counseling and coordinating care related to the above assessment and plan.  Tacey Ruiz, [facility] Palliative Medicine Team Team Phone # [phone]  Pager [phone]

## 2021-12-04 NOTE — TOC Transition Note (Signed)
Transition of Care [facility]) - CM/SW Discharge Note   Patient Details  Name: David Barton MRN: [id number] Date of Birth: December 20, 1933  Transition of Care [facility]) CM/SW Contact:  Angelita Ingles, RN Phone Number:[phone]  12/04/2021, 2:19 PM   Clinical Narrative:    [facility] consulted for [facility] needs and outpatient palliative care consult. CM spoke with wife to offer choice for [facility] care. [place] referral has been submitted to [facility]. Tommi Rumps with [facility] accepted [facility] referral. Pallitive referral was called to Venia Carbon at [employer] DME wheelchair ordered through [employer] and to be delivered to the bedside. Wife states that she and daughter will transport patient home at discharge. No other TOC needs noted at this time. TOC will sign off.    Final next level of care: Home w Home Health Services Barriers to Discharge: No Barriers Identified   Patient Goals and CMS Choice   CMS Medicare.gov Compare Post Acute Care list provided to:: Patient Choice offered to / list presented to : Patient, Spouse  Discharge Placement                       Discharge Plan and Services In-house Referral: Clinical Social Work              DME Arranged: Programmer, multimedia DME Agency: [employer] Date DME Agency Contacted: 12/04/21 Time DME Agency Contacted: [id number] Representative spoke with at DME Agency: Adela Lank HH Arranged: PT, OT, Nurse's Aide, RN [place] Agency: [place] Date [place]: 12/04/21 Time [place]: 1417 Representative spoke with at [place]: [place] ([place]) Interventions     Readmission Risk Interventions Readmission Risk Prevention Plan 03/20/2019  Transportation Screening Complete  PCP or Specialist Appt within 5-7 Days Complete  Home Care Screening Complete  Medication Review (RN CM) Complete  Some recent data might be hidden

## 2021-12-04 NOTE — Progress Notes (Signed)
Patient suffers from weakness which impairs their ability to perform daily activities like bathing, dressing, grooming, and toileting in the home.  A cane, crutch, or walker will not resolve  issue with performing activities of daily living. A wheelchair will allow patient to safely perform daily activities. Patient is not able to propel themselves in the home using a standard weight wheelchair due to general weakness. Patient can self propel in the lightweight wheelchair. Length of need Lifetime. Accessories: elevating leg rests (ELRs), wheel locks, extensions and anti-tippers.

## 2021-12-04 NOTE — Progress Notes (Signed)
Occupational Therapy Treatment Patient Details Name: David Barton MRN: [id number] DOB: 10-Aug-1933 Today's Date: 12/04/2021   History of present illness 87 year old male  presents to the ER 11/28/21 from the pulmonary office.  Patient was sent to the ER due to pneumonia seen on chest x-ray. hypotensive; compression fx L1;   PMH-- end-stage renal disease on peritoneal dialysis, COPD, recurrent pneumonia, A. fib on anticoagulation, colon Ca, HTN   OT comments  Pt presenting with increased pain and weakness this session. He is requiring mod A to pivot to bed and min guard to lateral scoot to HOB. Once situated in bed, pt O2 sats remained in mid 80s and pt was SoB for 5+ mins on 4L. Physically pt will need increased assist at home, as well as a Wheelchair to reduce caregiver burden. With recent diagnosis, pt is choosing to go home with Palliative care and increased assist from family. D/C recommendation updated to [place]. OT will continue to follow acutely.    Recommendations for follow up therapy are one component of a multi-disciplinary discharge planning process, led by the attending physician.  Recommendations may be updated based on patient status, additional functional criteria and insurance authorization.    Follow Up Recommendations  Home health OT    Assistance Recommended at Discharge Frequent or constant Supervision/Assistance  Equipment Recommendations  Wheelchair (measurements OT);Wheelchair cushion (measurements OT)    Recommendations for Other Services      Precautions / Restrictions Precautions Precautions: Fall;Back Precaution Comments: Reviewed back precautions Restrictions Weight Bearing Restrictions: No       Mobility Bed Mobility Overal bed mobility: Needs Assistance Bed Mobility: Sit to Supine     Supine to sit: Mod assist;HOB elevated Sit to supine: Min assist   General bed mobility comments: MinA to bring BLLE into bed.    Transfers Overall transfer  level: Needs assistance Equipment used: 1 person hand held assist Transfers: Bed to chair/wheelchair/BSC Sit to Stand: Mod assist   Step pivot transfers: Mod assist      Lateral/Scoot Transfers: Min guard General transfer comment: mod assist to power up to stand, min assist to stabilize balance for pivot steps recliner to bed     Balance Overall balance assessment: Needs assistance Sitting-balance support: No upper extremity supported;Feet supported Sitting balance-Leahy Scale: Fair     Standing balance support: Bilateral upper extremity supported;During functional activity Standing balance-Leahy Scale: Poor Standing balance comment: reliant on external assist                           ADL either performed or assessed with clinical judgement   ADL Overall ADL's : Needs assistance/impaired                         Toilet Transfer: Moderate assistance;Stand-pivot Toilet Transfer Details (indicate cue type and reason): simulated with recliner in room.           General ADL Comments: Pt limited by increased pain this session    Extremity/Trunk Assessment              Vision       Perception     Praxis      Cognition Arousal/Alertness: Awake/alert Behavior During Therapy: WFL for tasks assessed/performed Overall Cognitive Status: Within Functional Limits for tasks assessed  Exercises     Shoulder Instructions       General Comments Pt on 4L O2, desatting to low 80s with transfer back to bed. Difficulty catching his breath once back in bed.    Pertinent Vitals/ Pain       Pain Assessment: Faces Faces Pain Scale: Hurts even more Pain Location: Back Pain Descriptors / Indicators: Grimacing;Guarding;Discomfort Pain Intervention(s): Limited activity within patient's tolerance;Monitored during session;Repositioned;Patient requesting pain meds-RN notified  Home Living                                           Prior Functioning/Environment              Frequency  Min 2X/week        Progress Toward Goals  OT Goals(current goals can now be found in the care plan section)  Progress towards OT goals: Not progressing toward goals - comment  Acute Rehab OT Goals Patient Stated Goal: To get home OT Goal Formulation: With patient Time For Goal Achievement: 12/13/21 Potential to Achieve Goals: Fair ADL Goals Pt Will Perform Grooming: with modified independence;sitting Pt Will Perform Lower Body Bathing: with modified independence;sitting/lateral leans;sit to/from stand Pt Will Perform Lower Body Dressing: with modified independence;sitting/lateral leans;sit to/from stand Pt Will Transfer to Toilet: with modified independence;ambulating Pt Will Perform Toileting - Clothing Manipulation and hygiene: with modified independence;sitting/lateral leans;sit to/from stand Additional ADL Goal #1: Pt recall 3/3 spinal precautions  Plan Frequency remains appropriate;Discharge plan needs to be updated    Co-evaluation                 AM-PAC OT "6 Clicks" Daily Activity     Outcome Measure   Help from another person eating meals?: A Little Help from another person taking care of personal grooming?: A Little Help from another person toileting, which includes using toliet, bedpan, or urinal?: A Lot Help from another person bathing (including washing, rinsing, drying)?: A Lot Help from another person to put on and taking off regular upper body clothing?: A Little Help from another person to put on and taking off regular lower body clothing?: A Lot 6 Click Score: 15    End of Session Equipment Utilized During Treatment: Gait belt;Oxygen  OT Visit Diagnosis: Unsteadiness on feet (R26.81);Other abnormalities of gait and mobility (R26.89);Muscle weakness (generalized) (M62.81)   Activity Tolerance Patient limited by fatigue   Patient Left in  bed;with call bell/phone within reach;with bed alarm set   Nurse Communication Mobility status        Time: 5409-8119 OT Time Calculation (min): 23 min  Charges: OT General Charges $OT Visit: 1 Visit OT Treatments $Self Care/Home Management : 8-22 mins $Therapeutic Activity: 8-22 mins  Suhayla Chisom H., OTR/L Acute Rehabilitation  Jody Silas Elane Artin Mceuen 12/04/2021, 12:44 PM

## 2021-12-04 NOTE — Progress Notes (Signed)
David Barton Progress Note   Subjective:  Seen in room. Completed PD without issues. Met with oncology yesterday -does not want to do chemo. Will focus on supportive care. Plans for discharge to home today.   Objective Vitals:   12/04/21 0442 12/04/21 0900 12/04/21 1000 12/04/21 1004  BP:  (!) 85/58 90/60   Pulse: (!) 102 (!) 107 (!) 102 100  Resp: $Remo'20 20  20  'Exoqc$ Temp:  98.1 F (36.7 C)  97.7 F (36.5 C)  TempSrc:  Oral  Oral  SpO2: 93% 91%  93%  Weight: 86.6 kg   79.2 kg  Height:         Additional Objective Labs: Basic Metabolic Panel: Recent Labs  Lab 12/02/21 0129 12/03/21 0124 12/04/21 0142  NA 133* 134* 133*  K 3.5 3.3* 3.2*  CL 96* 94* 96*  CO2 $Re'23 24 26  'kUT$ GLUCOSE 147* 129* 134*  BUN 53* 52* 47*  CREATININE 6.63* 6.71* 6.33*  CALCIUM 7.7* 7.7* 7.9*  PHOS 6.6* 5.7* 5.2*    CBC: Recent Labs  Lab 11/27/21 1828 11/28/21 0441 11/29/21 0631 11/30/21 0346 12/01/21 0133 12/02/21 0129 12/03/21 0124  WBC 12.0* 10.2 10.8* 10.2 7.6 15.1* 12.8*  NEUTROABS 9.6* 8.1*  --   --   --   --   --   HGB 8.2* 7.5* 9.0* 9.3* 8.1* 8.6* 8.6*  HCT 25.2* 22.5* 26.6* 28.2* 25.5* 27.0* 26.4*  MCV 105.4* 106.6* 102.3* 102.9* 106.3* 106.3* 105.2*  PLT 182 165 146* 143* 126* 149* 144*    Blood Culture    Component Value Date/Time   SDES  11/27/2021 1847    BLOOD RIGHT ANTECUBITAL Performed at [facility], [address], [place], [place] [address]    [facility]  11/27/2021 1847    Blood Culture adequate volume BOTTLES DRAWN AEROBIC AND ANAEROBIC Performed at [facility], [address], [place], [place] [address]    CULT  11/27/2021 1847    NO GROWTH 5 DAYS Performed at [facility], [place] [address] [place], [place] [address]    REPTSTATUS 12/02/2021 FINAL 11/27/2021 1847     Physical Exam General: Alert, lying bed, on nasal O2, nad  Heart: RRR, No m,r,g  Lungs: Scattered rhonci throughout  Abdomen: soft  non-tender  Extremities: Trace LE edema  Dialysis Access: PD cath in place   Medications:  dialysis solution 1.5% low-MG/low-CA     dialysis solution 2.5% low-MG/low-CA      apixaban  2.5 mg Oral BID   arformoterol  15 mcg Nebulization BID   calcitonin (salmon)  1 spray Alternating Nares Daily   doxycycline  100 mg Oral Q12H   gentamicin cream  1 application Topical Daily   magnesium oxide  800 mg Oral BID   metoprolol succinate  12.5 mg Oral Daily   midodrine  20 mg Oral TID WC   pantoprazole  40 mg Oral BID AC   polyethylene glycol  17 g Oral Daily   potassium chloride  40 mEq Oral Once   revefenacin  175 mcg Nebulization Daily   sevelamer carbonate  800 mg Oral TID WC    Dialysis Orders:  CCPD - 7x week 84.5kg, 6 exchanges, fill vol 3L last fill 1.5L Dwell time 1h 47min  Assessment/Plan: Adenocarcinoma of the lung. Oncology consulted/Palliative following. Does not want chemo. Supportive care only.  Multifocal pneumonia with loculated effusion. On IV antibiotics. Pulm consult s/p bronchoscopy 12/12 ESRD - continue CCPD. Using 2.5% x 2, 1.5%  x3  Hypokalemia -  supplement prn.  Anemia -Hgb 8.6, iron sat 53%, noted transfuse 1 unit 12/10.  Hold ESA in the setting of malignancy  Secondary hyperparathyroidism -Corr Ca ok. Continue Renvela binder/calcitriol  HTN/volume -hypotensive readings but asymp. BP Dropped on 12/12. BP  pre bronch,sec. to prob. anesth.meds //midodrine increased to 20 mg  tid, UF as tolerated, noted start midodrine 5 mg 3 PD(on 11/28/21 start) with patient having more p.o. intake  A. fib/CHF with EF 30% - On metoprolol +Eliquis  Chronic respiratory failure/COPD= on home O2 2 to 2.5 L,  History of L1 fracture 25%= plans per admit not a current candidate for kypho pain mod. controlled  RX per admit oxycodone/mobilize with PT/OT Dispo: DNR.  Poor prognosis per oncology. Will continue PD at home for now and likely transition to hospice when he can no longer  tolerate dialysis.   Lynnda Child PA-C [facility] 12/04/2021,10:56 AM

## 2021-12-05 DIAGNOSIS — Z743 Need for continuous supervision: Secondary | ICD-10-CM | POA: Diagnosis not present

## 2021-12-05 DIAGNOSIS — I499 Cardiac arrhythmia, unspecified: Secondary | ICD-10-CM | POA: Diagnosis not present

## 2021-12-05 DIAGNOSIS — R404 Transient alteration of awareness: Secondary | ICD-10-CM | POA: Diagnosis not present

## 2021-12-16 ENCOUNTER — Telehealth: Payer: Self-pay

## 2021-12-16 NOTE — Telephone Encounter (Signed)
Spoke with patient's wife [name] she states patient passed away. Will cancel PC referral.

## 2021-12-20 DIAGNOSIS — 419620001 Death: Secondary | SNOMED CT | POA: Diagnosis not present

## 2021-12-20 DEATH — deceased

## 2022-01-01 ENCOUNTER — Other Ambulatory Visit (HOSPITAL_COMMUNITY): Payer: Self-pay

## 2024-10-25 ENCOUNTER — Other Ambulatory Visit (HOSPITAL_COMMUNITY): Payer: Self-pay
